# Patient Record
Sex: Male | Born: 1950
Health system: Southern US, Community
[De-identification: ages and names within clinical notes are randomized; demographics above are authoritative.]

## PROBLEM LIST (undated history)

## (undated) DIAGNOSIS — K219 Gastro-esophageal reflux disease without esophagitis: Secondary | ICD-10-CM

## (undated) DIAGNOSIS — F329 Major depressive disorder, single episode, unspecified: Secondary | ICD-10-CM

## (undated) DIAGNOSIS — I219 Acute myocardial infarction, unspecified: Secondary | ICD-10-CM

## (undated) DIAGNOSIS — I739 Peripheral vascular disease, unspecified: Secondary | ICD-10-CM

## (undated) DIAGNOSIS — J449 Chronic obstructive pulmonary disease, unspecified: Secondary | ICD-10-CM

## (undated) DIAGNOSIS — C4491 Basal cell carcinoma of skin, unspecified: Secondary | ICD-10-CM

## (undated) DIAGNOSIS — M1990 Unspecified osteoarthritis, unspecified site: Secondary | ICD-10-CM

## (undated) DIAGNOSIS — F1910 Other psychoactive substance abuse, uncomplicated: Secondary | ICD-10-CM

## (undated) DIAGNOSIS — I2510 Atherosclerotic heart disease of native coronary artery without angina pectoris: Secondary | ICD-10-CM

## (undated) DIAGNOSIS — F419 Anxiety disorder, unspecified: Secondary | ICD-10-CM

## (undated) DIAGNOSIS — E7800 Pure hypercholesterolemia, unspecified: Secondary | ICD-10-CM

## (undated) DIAGNOSIS — I10 Essential (primary) hypertension: Secondary | ICD-10-CM

## (undated) DIAGNOSIS — J189 Pneumonia, unspecified organism: Secondary | ICD-10-CM

## (undated) DIAGNOSIS — F17200 Nicotine dependence, unspecified, uncomplicated: Secondary | ICD-10-CM

## (undated) DIAGNOSIS — I452 Bifascicular block: Secondary | ICD-10-CM

## (undated) DIAGNOSIS — I639 Cerebral infarction, unspecified: Secondary | ICD-10-CM

## (undated) DIAGNOSIS — C801 Malignant (primary) neoplasm, unspecified: Secondary | ICD-10-CM

## (undated) DIAGNOSIS — I779 Disorder of arteries and arterioles, unspecified: Secondary | ICD-10-CM

## (undated) DIAGNOSIS — H269 Unspecified cataract: Secondary | ICD-10-CM

## (undated) DIAGNOSIS — F32A Depression, unspecified: Secondary | ICD-10-CM

## (undated) HISTORY — PX: US EXTREMITY*L*: HXRAD785

## (undated) HISTORY — DX: Disorder of arteries and arterioles, unspecified: I77.9

## (undated) HISTORY — DX: Bifascicular block: I45.2

## (undated) HISTORY — PX: EYE SURGERY: SHX253

## (undated) HISTORY — DX: Depression, unspecified: F32.A

## (undated) HISTORY — DX: Acute myocardial infarction, unspecified: I21.9

## (undated) HISTORY — PX: BACK SURGERY: SHX140

## (undated) HISTORY — DX: Nicotine dependence, unspecified, uncomplicated: F17.200

## (undated) HISTORY — DX: Gastro-esophageal reflux disease without esophagitis: K21.9

## (undated) HISTORY — DX: Major depressive disorder, single episode, unspecified: F32.9

## (undated) HISTORY — DX: Peripheral vascular disease, unspecified: I73.9

## (undated) HISTORY — DX: Unspecified cataract: H26.9

## (undated) HISTORY — DX: Other psychoactive substance abuse, uncomplicated: F19.10

---

## 2002-06-23 HISTORY — PX: FEMORAL ARTERY - FEMORAL ARTERY BYPASS GRAFT: SUR179

## 2002-06-23 HISTORY — PX: CORONARY ANGIOPLASTY: SHX604

## 2002-07-14 ENCOUNTER — Inpatient Hospital Stay (HOSPITAL_COMMUNITY): Admission: AD | Admit: 2002-07-14 | Discharge: 2002-07-16 | Payer: Self-pay | Admitting: Cardiology

## 2002-08-30 ENCOUNTER — Ambulatory Visit (HOSPITAL_COMMUNITY): Admission: RE | Admit: 2002-08-30 | Discharge: 2002-08-30 | Payer: Self-pay | Admitting: Interventional Cardiology

## 2002-08-31 ENCOUNTER — Encounter: Payer: Self-pay | Admitting: Interventional Cardiology

## 2002-08-31 ENCOUNTER — Inpatient Hospital Stay (HOSPITAL_COMMUNITY): Admission: AD | Admit: 2002-08-31 | Discharge: 2002-09-04 | Payer: Self-pay | Admitting: Interventional Cardiology

## 2002-09-01 ENCOUNTER — Encounter (INDEPENDENT_AMBULATORY_CARE_PROVIDER_SITE_OTHER): Payer: Self-pay | Admitting: *Deleted

## 2002-12-08 ENCOUNTER — Emergency Department (HOSPITAL_COMMUNITY): Admission: EM | Admit: 2002-12-08 | Discharge: 2002-12-09 | Payer: Self-pay

## 2002-12-09 ENCOUNTER — Encounter: Payer: Self-pay | Admitting: Cardiology

## 2002-12-09 ENCOUNTER — Inpatient Hospital Stay (HOSPITAL_COMMUNITY): Admission: EM | Admit: 2002-12-09 | Discharge: 2002-12-13 | Payer: Self-pay | Admitting: Psychiatry

## 2002-12-09 ENCOUNTER — Inpatient Hospital Stay (HOSPITAL_COMMUNITY): Admission: AD | Admit: 2002-12-09 | Discharge: 2002-12-09 | Payer: Self-pay | Admitting: Cardiology

## 2002-12-13 ENCOUNTER — Encounter: Payer: Self-pay | Admitting: Psychiatry

## 2003-08-22 ENCOUNTER — Inpatient Hospital Stay (HOSPITAL_COMMUNITY): Admission: EM | Admit: 2003-08-22 | Discharge: 2003-08-25 | Payer: Self-pay | Admitting: Emergency Medicine

## 2004-08-24 ENCOUNTER — Emergency Department (HOSPITAL_COMMUNITY): Admission: EM | Admit: 2004-08-24 | Discharge: 2004-08-24 | Payer: Self-pay | Admitting: Emergency Medicine

## 2004-10-07 ENCOUNTER — Inpatient Hospital Stay (HOSPITAL_COMMUNITY): Admission: EM | Admit: 2004-10-07 | Discharge: 2004-10-12 | Payer: Self-pay | Admitting: Emergency Medicine

## 2004-10-08 ENCOUNTER — Encounter (INDEPENDENT_AMBULATORY_CARE_PROVIDER_SITE_OTHER): Payer: Self-pay | Admitting: Cardiology

## 2008-05-24 ENCOUNTER — Inpatient Hospital Stay (HOSPITAL_COMMUNITY): Admission: EM | Admit: 2008-05-24 | Discharge: 2008-05-27 | Payer: Self-pay | Admitting: Emergency Medicine

## 2008-05-26 ENCOUNTER — Encounter (INDEPENDENT_AMBULATORY_CARE_PROVIDER_SITE_OTHER): Payer: Self-pay | Admitting: Internal Medicine

## 2008-05-30 ENCOUNTER — Ambulatory Visit (HOSPITAL_COMMUNITY): Admission: RE | Admit: 2008-05-30 | Discharge: 2008-05-30 | Payer: Self-pay | Admitting: Cardiovascular Disease

## 2009-07-22 ENCOUNTER — Inpatient Hospital Stay (HOSPITAL_COMMUNITY): Admission: EM | Admit: 2009-07-22 | Discharge: 2009-07-29 | Payer: Self-pay | Admitting: Emergency Medicine

## 2009-07-23 ENCOUNTER — Encounter (INDEPENDENT_AMBULATORY_CARE_PROVIDER_SITE_OTHER): Payer: Self-pay | Admitting: Cardiology

## 2009-07-23 ENCOUNTER — Ambulatory Visit: Payer: Self-pay | Admitting: Cardiothoracic Surgery

## 2009-07-23 ENCOUNTER — Encounter: Payer: Self-pay | Admitting: Cardiothoracic Surgery

## 2009-07-24 HISTORY — PX: CORONARY ARTERY BYPASS GRAFT: SHX141

## 2009-08-15 ENCOUNTER — Ambulatory Visit: Payer: Self-pay | Admitting: Cardiothoracic Surgery

## 2009-08-15 ENCOUNTER — Encounter: Admission: RE | Admit: 2009-08-15 | Discharge: 2009-08-15 | Payer: Self-pay | Admitting: Cardiothoracic Surgery

## 2010-05-28 ENCOUNTER — Ambulatory Visit (HOSPITAL_COMMUNITY)
Admission: RE | Admit: 2010-05-28 | Discharge: 2010-05-29 | Payer: Self-pay | Source: Home / Self Care | Attending: Neurosurgery | Admitting: Neurosurgery

## 2010-07-14 ENCOUNTER — Encounter: Payer: Self-pay | Admitting: Cardiovascular Disease

## 2010-08-22 ENCOUNTER — Emergency Department (HOSPITAL_COMMUNITY): Payer: Self-pay

## 2010-08-22 ENCOUNTER — Observation Stay (HOSPITAL_COMMUNITY)
Admission: EM | Admit: 2010-08-22 | Discharge: 2010-08-25 | DRG: 313 | Disposition: A | Discharge status: Home / Self Care | Payer: Self-pay | Attending: Internal Medicine | Admitting: Internal Medicine

## 2010-08-22 DIAGNOSIS — J4489 Other specified chronic obstructive pulmonary disease: Secondary | ICD-10-CM | POA: Insufficient documentation

## 2010-08-22 DIAGNOSIS — I252 Old myocardial infarction: Secondary | ICD-10-CM | POA: Insufficient documentation

## 2010-08-22 DIAGNOSIS — E119 Type 2 diabetes mellitus without complications: Secondary | ICD-10-CM | POA: Insufficient documentation

## 2010-08-22 DIAGNOSIS — I10 Essential (primary) hypertension: Secondary | ICD-10-CM | POA: Insufficient documentation

## 2010-08-22 DIAGNOSIS — I2510 Atherosclerotic heart disease of native coronary artery without angina pectoris: Secondary | ICD-10-CM | POA: Insufficient documentation

## 2010-08-22 DIAGNOSIS — Z951 Presence of aortocoronary bypass graft: Secondary | ICD-10-CM | POA: Insufficient documentation

## 2010-08-22 DIAGNOSIS — D72829 Elevated white blood cell count, unspecified: Secondary | ICD-10-CM | POA: Insufficient documentation

## 2010-08-22 DIAGNOSIS — E785 Hyperlipidemia, unspecified: Secondary | ICD-10-CM | POA: Insufficient documentation

## 2010-08-22 DIAGNOSIS — R0789 Other chest pain: Principal | ICD-10-CM | POA: Insufficient documentation

## 2010-08-22 DIAGNOSIS — R0602 Shortness of breath: Secondary | ICD-10-CM | POA: Insufficient documentation

## 2010-08-22 DIAGNOSIS — J449 Chronic obstructive pulmonary disease, unspecified: Secondary | ICD-10-CM | POA: Insufficient documentation

## 2010-08-22 DIAGNOSIS — F4001 Agoraphobia with panic disorder: Secondary | ICD-10-CM | POA: Insufficient documentation

## 2010-08-22 LAB — POCT CARDIAC MARKERS
CKMB, poc: 2.3 ng/mL (ref 1.0–8.0)
Troponin i, poc: 0.16 ng/mL — ABNORMAL HIGH (ref 0.00–0.09)

## 2010-08-22 LAB — CK TOTAL AND CKMB (NOT AT ARMC)
CK, MB: 1.2 ng/mL (ref 0.3–4.0)
Relative Index: 1 (ref 0.0–2.5)
Total CK: 115 U/L (ref 7–232)

## 2010-08-22 LAB — DIFFERENTIAL
Basophils Absolute: 0 10*3/uL (ref 0.0–0.1)
Basophils Relative: 0 % (ref 0–1)
Eosinophils Absolute: 0 10*3/uL (ref 0.0–0.7)
Eosinophils Relative: 0 % (ref 0–5)
Lymphocytes Relative: 24 % (ref 12–46)
Lymphs Abs: 3.1 10*3/uL (ref 0.7–4.0)
Monocytes Absolute: 0.8 10*3/uL (ref 0.1–1.0)
Monocytes Relative: 6 % (ref 3–12)
Neutro Abs: 8.9 10*3/uL — ABNORMAL HIGH (ref 1.7–7.7)
Neutrophils Relative %: 69 % (ref 43–77)

## 2010-08-22 LAB — POCT I-STAT, CHEM 8
BUN: 11 mg/dL (ref 6–23)
Calcium, Ion: 1.17 mmol/L (ref 1.12–1.32)
Chloride: 101 meq/L (ref 96–112)
Creatinine, Ser: 1.1 mg/dL (ref 0.4–1.5)
Glucose, Bld: 118 mg/dL — ABNORMAL HIGH (ref 70–99)
HCT: 51 % (ref 39.0–52.0)
Hemoglobin: 17.3 g/dL — ABNORMAL HIGH (ref 13.0–17.0)
Potassium: 4.6 meq/L (ref 3.5–5.1)
Sodium: 134 meq/L — ABNORMAL LOW (ref 135–145)
TCO2: 24 mmol/L (ref 0–100)

## 2010-08-22 LAB — CBC
HCT: 47.4 % (ref 39.0–52.0)
Hemoglobin: 16.5 g/dL (ref 13.0–17.0)
MCH: 32.4 pg (ref 26.0–34.0)
MCHC: 34.8 g/dL (ref 30.0–36.0)
MCV: 93.1 fL (ref 78.0–100.0)
Platelets: 228 K/uL (ref 150–400)
RBC: 5.09 MIL/uL (ref 4.22–5.81)
RDW: 13.3 % (ref 11.5–15.5)
WBC: 12.8 K/uL — ABNORMAL HIGH (ref 4.0–10.5)

## 2010-08-22 LAB — TROPONIN I

## 2010-08-22 LAB — BASIC METABOLIC PANEL WITH GFR
BUN: 9 mg/dL (ref 6–23)
CO2: 25 meq/L (ref 19–32)
Calcium: 9.9 mg/dL (ref 8.4–10.5)
Chloride: 98 meq/L (ref 96–112)
Creatinine, Ser: 1.05 mg/dL (ref 0.4–1.5)
GFR calc non Af Amer: >60 mL/min
Glucose, Bld: 125 mg/dL — ABNORMAL HIGH (ref 70–99)
Potassium: 4.6 meq/L (ref 3.5–5.1)
Sodium: 134 meq/L — ABNORMAL LOW (ref 135–145)

## 2010-08-22 LAB — PROTIME-INR
INR: 0.98 (ref 0.00–1.49)
Prothrombin Time: 13.2 s (ref 11.6–15.2)

## 2010-08-22 LAB — APTT: aPTT: 34 s (ref 24–37)

## 2010-08-23 ENCOUNTER — Inpatient Hospital Stay (HOSPITAL_COMMUNITY): Payer: Self-pay

## 2010-08-23 LAB — BASIC METABOLIC PANEL
BUN: 12 mg/dL (ref 6–23)
Calcium: 9.6 mg/dL (ref 8.4–10.5)
Creatinine, Ser: 1.16 mg/dL (ref 0.4–1.5)
GFR calc non Af Amer: >60 mL/min (ref >60)
Glucose, Bld: 89 mg/dL (ref 70–99)
Potassium: 4.1 mEq/L (ref 3.5–5.1)

## 2010-08-23 LAB — CARDIAC PANEL(CRET KIN+CKTOT+MB+TROPI)
CK, MB: 1.7 ng/mL (ref 0.3–4.0)
Relative Index: 1.3 (ref 0.0–2.5)
Total CK: 127 U/L (ref 7–232)
Troponin I: 0.01 ng/mL (ref 0.00–0.06)
Troponin I: <0.01 ng/mL (ref 0.00–0.06)

## 2010-08-23 LAB — GLUCOSE, CAPILLARY
Glucose-Capillary: 106 mg/dL — ABNORMAL HIGH (ref 70–99)
Glucose-Capillary: 156 mg/dL — ABNORMAL HIGH (ref 70–99)
Glucose-Capillary: 99 mg/dL (ref 70–99)

## 2010-08-23 LAB — CBC
HCT: 44.7 % (ref 39.0–52.0)
Hemoglobin: 15.7 g/dL (ref 13.0–17.0)
MCH: 32.6 pg (ref 26.0–34.0)
MCHC: 35.1 g/dL (ref 30.0–36.0)
MCV: 92.9 fL (ref 78.0–100.0)
RDW: 13.3 % (ref 11.5–15.5)

## 2010-08-23 LAB — BRAIN NATRIURETIC PEPTIDE: Pro B Natriuretic peptide (BNP): <30 pg/mL (ref 0.0–100.0)

## 2010-08-23 LAB — HEPARIN LEVEL (UNFRACTIONATED): Heparin Unfractionated: 0.18 IU/mL — ABNORMAL LOW (ref 0.30–0.70)

## 2010-08-23 MED ORDER — TECHNETIUM TC 99M TETROFOSMIN IV KIT
10.0000 | PACK | Freq: Once | INTRAVENOUS | Status: AC | PRN
  Administered 2010-08-23: 10 via INTRAVENOUS

## 2010-08-23 MED ORDER — TECHNETIUM TC 99M TETROFOSMIN IV KIT
30.0000 | PACK | Freq: Once | INTRAVENOUS | Status: AC | PRN
  Administered 2010-08-23: 30 via INTRAVENOUS

## 2010-08-24 ENCOUNTER — Inpatient Hospital Stay (HOSPITAL_COMMUNITY): Payer: Self-pay

## 2010-08-24 DIAGNOSIS — F064 Anxiety disorder due to known physiological condition: Secondary | ICD-10-CM

## 2010-08-24 LAB — GLUCOSE, CAPILLARY
Glucose-Capillary: 157 mg/dL — ABNORMAL HIGH (ref 70–99)
Glucose-Capillary: 113 mg/dL — ABNORMAL HIGH (ref 70–99)
Glucose-Capillary: 140 mg/dL — ABNORMAL HIGH (ref 70–99)

## 2010-08-24 LAB — CBC
Hemoglobin: 16.3 g/dL (ref 13.0–17.0)
RBC: 5.04 MIL/uL (ref 4.22–5.81)
WBC: 13.3 10*3/uL — ABNORMAL HIGH (ref 4.0–10.5)

## 2010-08-25 LAB — CBC
HCT: 45.3 % (ref 39.0–52.0)
RDW: 13 % (ref 11.5–15.5)
WBC: 13.6 10*3/uL — ABNORMAL HIGH (ref 4.0–10.5)

## 2010-08-25 LAB — GLUCOSE, CAPILLARY: Glucose-Capillary: 113 mg/dL — ABNORMAL HIGH (ref 70–99)

## 2010-08-26 NOTE — Consult Note (Signed)
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  Psychiatric consultation is requested to assess mental status and make recommendations in relation to his anxiety symptoms.  HISTORY OF PRESENT ILLNESS:  The patient is an informant for the following information and he appears to be a good historian.  He indicates that he is currently a patient of Dr. Andee Poles, at the [facility]  He has a history of myocardial infarction, hypertension, hyperlipidemia, diabetes mellitus, and COPD.  He also indicates that since approximately 1986, he has had continuous problems with anxiety and panic episodes.  He states that they are worsening over time.  Recently, he has four to five episodes of panic per week characterized by extreme, overwhelming anxiety, shortness of breath, tunnel vision, and a sense of completely "locking up."  This has become increasingly disabling, and because of the possibility that it can happen in public, he has become increasingly isolative over the years.  Current psychotropic medications include Paxil 20 mg daily, just initiated here in the past day, but prior to this, he was taking Celexa for some time.  His impression is that Celexa has never been beneficial. He had also been taking Xanax, prescribed by Dr. Andee Poles.  He was apparently taking 0.25 mg once or twice a day, only on an as-needed basis.  That is, he was waiting until he got good and anxious, or could not sleep, to take some Xanax.  He  states that he has sought treatment for his anxiety symptoms in many different places and from various providers over the years.  Long time ago, he states he spent a lot of money, to no avail, on expenses psychotherapy sessions.  He cannot recall any medication that has been of any particular benefit over time.  PAST MEDICAL HISTORY:  As above.  MEDICATIONS:  Current medications include, 1. Insulin. 2. Advair. 3. Xopenex. 4. Metformin. 5. Lopressor. 6. Crestor. 7. Aspirin. 8. Paxil 20 mg daily. 9. Xanax 0.25 mg q.8 h p.r.n.  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 He has a grown daughter.  He does have friends, but has been increasingly isolative and withdrawn.  He indicates that he does not use alcohol or drugs.  FAMILY HISTORY:  He states that his mother was "anxious a lot," and his father had nervous tendencies as well.  MENTAL STATUS AND OBSERVATIONS:  The patient is a well-nourished, normally-developed, adult male, who was pleasant and cooperative.  He is anxious with depressed mood.  He appears to be a good historian. Thoughts and speech are normally organized and there is nothing to suggest any thought  disorder or cognitive impairment.  Insight and judgment are good.  IMPRESSION:  Axis I:  Panic disorder with agoraphobia, severe, chronic. Axis II:  Deferred. Axis III:  See medical notes. Axis IV:  Stressors severe. Axis V:  GAF of 50.  RECOMMENDATIONS:  I had a long discussion with Jason [name] today regarding the nature and phenomenology of panic disorder, and its treatment.  I let him know that there is good hope that his symptoms can be gotten under much better control.  Although it is reasonable for him to be on a trial of SSRI antidepressant, there are a fair number of individuals with anxiety and panic, who did not respond to them.  These individuals may need to be on a benzodiazepine on a lifelong basis, and as long as they  have no history of substance abuse, which the case here, there is no problem with them taking routine doses of medication such as Klonopin, Xanax, or other benzodiazepines on a monitored and scheduled basis.  It is my impression that Jason [name]'s anxiety disorder over time has been significantly under treated.  It also appears that no physical or medical basis for his current physical symptoms is being found in course of this hospitalization.  Jason [name] agreed with me that if it turns out that all of his symptoms have been due to his anxiety, that is a preferable finding, as opposed to one it indicates that he is having an advancement or worsening of his cardiac disease.  Many individuals with histories of cardiac disease and myocardial infarction are prone to anxiety and depression, and in some cases, the development of a "cardiac neurosis," which may be the case here as well.  My recommendation for now would be to discontinue Xanax.  Because of his longer half-life, Klonopin is a preferable medication.  I would begin with 0.5 mg t.i.d., which I have ordered.  If he becomes oversedated, which is unlikely, dose should be held, but otherwise, from 0.5 mg t.i.d., there can be an upward titration until he is feeling comfortable with respect to anxiety.  This will need to be monitored by a physician following his discharge from [facility].  I gave him the name of two psychiatrists in the area, Dr. Nolen Mu and Dr. Jennelle Human.  The patient commented that he does not have any insurance and it would be hard for him to pay out of pocket.  I told him if he is not able to afford to see a psychiatrist, that he should have his primary care physician, Dr. Andee Poles, telephone me, so I can advise him on how best to treat and adjust the Klonopin over time. I gave the patient my cell phone number, so he gave this to Dr. Andee Poles.  Thank you for involving Psychiatry in this most interesting  patient's care.     Anselm Jungling, MD     SPB/MEDQ  D:  08/24/2010  T:  08/24/2010  Job:  [id number]  Electronically Signed by Geralyn Flash MD on 08/26/2010 11:05:08 AM

## 2010-08-30 NOTE — Discharge Summary (Signed)
Jason [name], Jason [name]             ACCOUNT NO.:  [id number]  MEDICAL RECORD NO.:  [id number]           PATIENT TYPE:  I  LOCATION:  2035                         FACILITY:  [facility]  PHYSICIAN:  [place] Keyandre Pileggi, MD         DATE OF BIRTH:  1950/09/14  DATE OF ADMISSION:  08/22/2010 DATE OF DISCHARGE:  08/25/2010                              DISCHARGE SUMMARY   DISCHARGE DIAGNOSES: 1. Chest pain, noncardiac, with negative myocardial infarction,     negative cardiac enzymes, and negative ischemia on Lexiscan     Myoview.  Chest pain secondary to panic attacks. 2. Anxiety/panic attacks. 3. Coronary artery disease with bypass grafting x4. 4. Diabetes mellitus type 2. 5. Hypertension. 6. Leukocytosis, still slightly elevated at discharge but no obvious     infection.  We will follow up as an outpatient.  DISCHARGE CONDITION:  Improved.  PROCEDURES:  None.  CONSULTS:  Dr. Geralyn Flash, psychiatrist, for anxiety and panic attacks.  DISCHARGE CONDITION:  Improved.  DISCHARGE MEDICATIONS:  See medication reconciliation from [facility], though we changed the patient from Xanax to Klonopin, changed from Celexa to Paxil.  Dr. Royann Shivers told him he could take an occasional Xanax if he needed it for breakthrough stressors that were not resolved with the Klonopin.  We also increased his beta-blocker and increase his metformin.  DISCHARGE INSTRUCTIONS: 1. May return to work on August 26, 2010.  Activity as tolerated.  Low     sodium, heart-healthy diabetic diet. 2. Follow up with Dr. Cleta Alberts this week. 3. Return to Dr. Alanda Amass in 1-2 weeks.  The office will call with     date and time.  HISTORY OF PRESENT ILLNESS:  A 60 year old white male with a history of coronary disease with bypass grafting in February 2011 x3 vessels with vein graft to the RI and OM, vein graft to the PDA and LIMA to the LAD. Previously had had stents to the RCA x2 in 2004.  Additionally has diabetes mellitus, continues to  smoke, and noncompliant with office followup at times.  His last cath was prior to his bypass grafting.  He was seen by Dr. Rennis Golden in the ER at [facility] on August 22, 2010 after being sent by Dr. Cleta Alberts, his primary, for increased dyspnea on exertion, chest tightness, and pressure.  Chest x-ray was negative.  EKG showed right bundle-branch block but he was also having extreme anxiety issues. He did complain of what felt like a brick sitting on his chest.  He was admitted, placed on IV heparin, IV nitroglycerin, and scheduled for a stress test.  His initial cardiac markers were troponin that was elevated at 0.14, but his cardiac enzymes were totally negative.  His nuclear study was negative for ischemia with EF of 60%.  He was going to be discharged, but then he stated his anxiety was so severe, he was concerned that he may harm himself.  He had run out of the Xanax as well.  So, we asked Psychiatry to see him to assist with his extreme anxiety and depression.  He was seen on August 24, 2010 and his Xanax  was changed to Klonopin and he was diagnosed with severe panic disorder with agoraphobia and cardiac neurosis.  The patient was given referrals for a psychiatrist.  The patient does not have any insurance, but the psychiatrist actually gave the patient his cell number and asked the patient to give it to Dr. Cleta Alberts so that if he could not get an appointment with one of the psychiatrist refer he referred him to, that Dr. Cleta Alberts could call the psychiatrist and they could adjust his Klonopin together.  By August 25, 2010 the patient was stable.  He did not think the Klonopin was helping very well and he had trouble going to bed and sleeping.  He received his Ambien and that was why we said it would be okay to take Xanax as a standby.  The patient was discharged on August 25, 2010 after Dr. Royann Shivers saw him and discussed the issues with him.  LABORATORY VALUES:  Hemoglobin 15.9; hematocrit 45.3; WBC was  13.6, it was slightly elevated throughout hospitalization; platelets 210.  His Accu-Cheks here were well controlled.  Dr. Rennis Golden was putting him on Lantus insulin, but unfortunately the patient states he cannot afford it at this time so at discharge we increased his metformin to 1000 mg twice a day and we will have Dr. Cleta Alberts follow his diabetes.  Chemistry; 137, potassium 4.1, chloride 102, CO2 of 25, glucose 89, BUN 12, creatinine 1.16, calcium 9.6.  CKs range 115, 142, 127 and MBs 1.2, 1.8, and 1.7. Troponin-Is were all negative at less than 0.01 and as stated one troponin marker was slightly elevated at 0.16.  BNP was less than 30.  RADIOLOGY:  Chest x-ray; no active cardiopulmonary disease.  Nuclear study; no evidence of ischemia or infarction, EF 60%.  EKG; sinus rhythm with right bundle-branch block and no acute changes otherwise.  The patient will follow up with Dr. Alanda Amass and Dr. Cleta Alberts and hopefully, he will be able to get in with the psychiatrist.  Please note the psychiatrist the patient was referred to, which is listed in Dr. Barrett Shell note, Dr. Nolen Mu or Dr. Jennelle Human.     Darcella Gasman. Annie Paras, N.P.   ______________________________ [place] Sharlisa Hollifield, MD    LRI/MEDQ  D:  08/25/2010  T:  08/26/2010  Job:  [id number]  cc:   Gerlene Burdock A. Alanda Amass, M.D. [place] Cleta Alberts, M.D.  Electronically Signed by Nada Boozer N.P. on 08/27/2010 05:49:33 PM Electronically Signed by Kirtland Bouchard. Zed Wanninger M.D. on 08/29/2010 08:02:18 AM

## 2010-09-02 LAB — GLUCOSE, CAPILLARY
Glucose-Capillary: 114 mg/dL — ABNORMAL HIGH (ref 70–99)
Glucose-Capillary: 115 mg/dL — ABNORMAL HIGH (ref 70–99)
Glucose-Capillary: 135 mg/dL — ABNORMAL HIGH (ref 70–99)
Glucose-Capillary: 183 mg/dL — ABNORMAL HIGH (ref 70–99)

## 2010-09-02 LAB — BASIC METABOLIC PANEL
BUN: 8 mg/dL (ref 6–23)
CO2: 27 mEq/L (ref 19–32)
Calcium: 10.2 mg/dL (ref 8.4–10.5)
Chloride: 102 mEq/L (ref 96–112)
Creatinine, Ser: 1.11 mg/dL (ref 0.4–1.5)
GFR calc Af Amer: >60 mL/min (ref >60)
GFR calc non Af Amer: >60 mL/min (ref >60)
Glucose, Bld: 87 mg/dL (ref 70–99)
Potassium: 4.7 mEq/L (ref 3.5–5.1)
Sodium: 138 mEq/L (ref 135–145)

## 2010-09-02 LAB — CBC
HCT: 48.7 % (ref 39.0–52.0)
Hemoglobin: 17.1 g/dL — ABNORMAL HIGH (ref 13.0–17.0)
MCH: 32.9 pg (ref 26.0–34.0)
MCHC: 35.1 g/dL (ref 30.0–36.0)
MCV: 93.8 fL (ref 78.0–100.0)
Platelets: 236 10*3/uL (ref 150–400)
RBC: 5.19 MIL/uL (ref 4.22–5.81)
RDW: 13 % (ref 11.5–15.5)
WBC: 10.9 10*3/uL — ABNORMAL HIGH (ref 4.0–10.5)

## 2010-09-02 LAB — SURGICAL PCR SCREEN
MRSA, PCR: NEGATIVE
Staphylococcus aureus: NEGATIVE

## 2010-09-08 LAB — CARDIAC PANEL(CRET KIN+CKTOT+MB+TROPI)
CK, MB: 2.9 ng/mL (ref 0.3–4.0)
CK, MB: 3.7 ng/mL (ref 0.3–4.0)
CK, MB: 4.4 ng/mL — ABNORMAL HIGH (ref 0.3–4.0)
CK, MB: 7.3 ng/mL (ref 0.3–4.0)
CK, MB: 8.9 ng/mL (ref 0.3–4.0)
Relative Index: 8.9 — ABNORMAL HIGH (ref 0.0–2.5)
Relative Index: INVALID (ref 0.0–2.5)
Relative Index: INVALID (ref 0.0–2.5)
Relative Index: INVALID (ref 0.0–2.5)
Relative Index: INVALID (ref 0.0–2.5)
Total CK: 100 U/L (ref 7–232)
Total CK: 50 U/L (ref 7–232)
Total CK: 57 U/L (ref 7–232)
Total CK: 58 U/L (ref 7–232)
Total CK: 86 U/L (ref 7–232)
Troponin I: 0.45 ng/mL — ABNORMAL HIGH (ref 0.00–0.06)
Troponin I: 0.45 ng/mL — ABNORMAL HIGH (ref 0.00–0.06)
Troponin I: 0.6 ng/mL (ref 0.00–0.06)
Troponin I: 0.62 ng/mL (ref 0.00–0.06)
Troponin I: 0.64 ng/mL (ref 0.00–0.06)

## 2010-09-08 LAB — CK TOTAL AND CKMB (NOT AT ARMC)
CK, MB: 7.8 ng/mL (ref 0.3–4.0)
Relative Index: INVALID (ref 0.0–2.5)
Total CK: 96 U/L (ref 7–232)

## 2010-09-08 LAB — HEMOGLOBIN A1C
Hgb A1c MFr Bld: 6.8 % — ABNORMAL HIGH (ref 4.6–6.1)
Mean Plasma Glucose: 148 mg/dL

## 2010-09-08 LAB — BASIC METABOLIC PANEL
BUN: 15 mg/dL (ref 6–23)
CO2: 24 mEq/L (ref 19–32)
Calcium: 8.3 mg/dL — ABNORMAL LOW (ref 8.4–10.5)
Chloride: 110 mEq/L (ref 96–112)
Creatinine, Ser: 1.3 mg/dL (ref 0.4–1.5)
GFR calc Af Amer: >60 mL/min (ref >60)
GFR calc non Af Amer: 57 mL/min — ABNORMAL LOW (ref >60)
Glucose, Bld: 141 mg/dL — ABNORMAL HIGH (ref 70–99)
Potassium: 3.7 mEq/L (ref 3.5–5.1)
Sodium: 140 mEq/L (ref 135–145)

## 2010-09-08 LAB — PROTIME-INR
INR: 0.89 (ref 0.00–1.49)
Prothrombin Time: 12 seconds (ref 11.6–15.2)

## 2010-09-08 LAB — HEPARIN LEVEL (UNFRACTIONATED)
Heparin Unfractionated: 0.23 IU/mL — ABNORMAL LOW (ref 0.30–0.70)
Heparin Unfractionated: 0.25 IU/mL — ABNORMAL LOW (ref 0.30–0.70)

## 2010-09-08 LAB — TROPONIN I: Troponin I: 0.43 ng/mL — ABNORMAL HIGH (ref 0.00–0.06)

## 2010-09-08 LAB — COMPREHENSIVE METABOLIC PANEL
ALT: 26 U/L (ref 0–53)
AST: 23 U/L (ref 0–37)
Albumin: 3.7 g/dL (ref 3.5–5.2)
Alkaline Phosphatase: 85 U/L (ref 39–117)
BUN: 10 mg/dL (ref 6–23)
CO2: 22 mEq/L (ref 19–32)
Calcium: 9.1 mg/dL (ref 8.4–10.5)
Chloride: 106 mEq/L (ref 96–112)
Creatinine, Ser: 0.99 mg/dL (ref 0.4–1.5)
GFR calc Af Amer: >60 mL/min (ref >60)
GFR calc non Af Amer: >60 mL/min (ref >60)
Glucose, Bld: 162 mg/dL — ABNORMAL HIGH (ref 70–99)
Potassium: 4.2 mEq/L (ref 3.5–5.1)
Sodium: 136 mEq/L (ref 135–145)
Total Bilirubin: 0.5 mg/dL (ref 0.3–1.2)
Total Protein: 6.5 g/dL (ref 6.0–8.3)

## 2010-09-08 LAB — DIFFERENTIAL
Basophils Absolute: 0.1 10*3/uL (ref 0.0–0.1)
Basophils Relative: 1 % (ref 0–1)
Eosinophils Absolute: 0.4 10*3/uL (ref 0.0–0.7)
Eosinophils Relative: 3 % (ref 0–5)
Lymphocytes Relative: 31 % (ref 12–46)
Lymphs Abs: 3.4 10*3/uL (ref 0.7–4.0)
Monocytes Absolute: 0.7 10*3/uL (ref 0.1–1.0)
Monocytes Relative: 6 % (ref 3–12)
Neutro Abs: 6.7 10*3/uL (ref 1.7–7.7)
Neutrophils Relative %: 59 % (ref 43–77)

## 2010-09-08 LAB — CROSSMATCH
ABO/RH(D): O POS
Antibody Screen: NEGATIVE

## 2010-09-08 LAB — CBC
HCT: 34.8 % — ABNORMAL LOW (ref 39.0–52.0)
HCT: 42.4 % (ref 39.0–52.0)
Hemoglobin: 12.2 g/dL — ABNORMAL LOW (ref 13.0–17.0)
Hemoglobin: 14.7 g/dL (ref 13.0–17.0)
MCHC: 34.7 g/dL (ref 30.0–36.0)
MCHC: 35 g/dL (ref 30.0–36.0)
MCV: 95.5 fL (ref 78.0–100.0)
MCV: 96.2 fL (ref 78.0–100.0)
Platelets: 174 10*3/uL (ref 150–400)
Platelets: 211 10*3/uL (ref 150–400)
RBC: 3.62 MIL/uL — ABNORMAL LOW (ref 4.22–5.81)
RBC: 4.44 MIL/uL (ref 4.22–5.81)
RDW: 13 % (ref 11.5–15.5)
RDW: 13.1 % (ref 11.5–15.5)
WBC: 11.3 10*3/uL — ABNORMAL HIGH (ref 4.0–10.5)
WBC: 13.8 10*3/uL — ABNORMAL HIGH (ref 4.0–10.5)

## 2010-09-08 LAB — GLUCOSE, CAPILLARY
Glucose-Capillary: 115 mg/dL — ABNORMAL HIGH (ref 70–99)
Glucose-Capillary: 123 mg/dL — ABNORMAL HIGH (ref 70–99)
Glucose-Capillary: 127 mg/dL — ABNORMAL HIGH (ref 70–99)
Glucose-Capillary: 84 mg/dL (ref 70–99)

## 2010-09-08 LAB — LIPID PANEL
Cholesterol: 171 mg/dL (ref 0–200)
HDL: 29 mg/dL — ABNORMAL LOW (ref >39)
LDL Cholesterol: 85 mg/dL (ref 0–99)
Total CHOL/HDL Ratio: 5.9 RATIO
Triglycerides: 284 mg/dL — ABNORMAL HIGH (ref <150)
VLDL: 57 mg/dL — ABNORMAL HIGH (ref 0–40)

## 2010-09-08 LAB — POCT I-STAT, CHEM 8
BUN: 12 mg/dL (ref 6–23)
Calcium, Ion: 1.18 mmol/L (ref 1.12–1.32)
Chloride: 109 mEq/L (ref 96–112)
Creatinine, Ser: 0.7 mg/dL (ref 0.4–1.5)
Glucose, Bld: 179 mg/dL — ABNORMAL HIGH (ref 70–99)
HCT: 43 % (ref 39.0–52.0)
Hemoglobin: 14.6 g/dL (ref 13.0–17.0)
Potassium: 4.2 mEq/L (ref 3.5–5.1)
Sodium: 136 mEq/L (ref 135–145)
TCO2: 22 mmol/L (ref 0–100)

## 2010-09-08 LAB — MRSA PCR SCREENING: MRSA by PCR: NEGATIVE

## 2010-09-08 LAB — TSH: TSH: 3.323 u[IU]/mL (ref 0.350–4.500)

## 2010-09-08 LAB — ABO/RH: ABO/RH(D): O POS

## 2010-09-08 LAB — BRAIN NATRIURETIC PEPTIDE: Pro B Natriuretic peptide (BNP): 54 pg/mL (ref 0.0–100.0)

## 2010-09-08 LAB — MAGNESIUM: Magnesium: 2.1 mg/dL (ref 1.5–2.5)

## 2010-09-11 LAB — POCT I-STAT 4, (NA,K, GLUC, HGB,HCT)
Glucose, Bld: 120 mg/dL — ABNORMAL HIGH (ref 70–99)
Glucose, Bld: 126 mg/dL — ABNORMAL HIGH (ref 70–99)
Glucose, Bld: 140 mg/dL — ABNORMAL HIGH (ref 70–99)
Glucose, Bld: 142 mg/dL — ABNORMAL HIGH (ref 70–99)
Glucose, Bld: 142 mg/dL — ABNORMAL HIGH (ref 70–99)
Glucose, Bld: 193 mg/dL — ABNORMAL HIGH (ref 70–99)
HCT: 24 % — ABNORMAL LOW (ref 39.0–52.0)
HCT: 28 % — ABNORMAL LOW (ref 39.0–52.0)
HCT: 30 % — ABNORMAL LOW (ref 39.0–52.0)
HCT: 34 % — ABNORMAL LOW (ref 39.0–52.0)
HCT: 35 % — ABNORMAL LOW (ref 39.0–52.0)
HCT: 36 % — ABNORMAL LOW (ref 39.0–52.0)
Hemoglobin: 10.2 g/dL — ABNORMAL LOW (ref 13.0–17.0)
Hemoglobin: 11.6 g/dL — ABNORMAL LOW (ref 13.0–17.0)
Hemoglobin: 11.9 g/dL — ABNORMAL LOW (ref 13.0–17.0)
Hemoglobin: 12.2 g/dL — ABNORMAL LOW (ref 13.0–17.0)
Hemoglobin: 8.2 g/dL — ABNORMAL LOW (ref 13.0–17.0)
Hemoglobin: 9.5 g/dL — ABNORMAL LOW (ref 13.0–17.0)
Potassium: 3.7 mEq/L (ref 3.5–5.1)
Potassium: 4 mEq/L (ref 3.5–5.1)
Potassium: 4 mEq/L (ref 3.5–5.1)
Potassium: 4.1 mEq/L (ref 3.5–5.1)
Potassium: 4.3 mEq/L (ref 3.5–5.1)
Potassium: 5.1 mEq/L (ref 3.5–5.1)
Sodium: 133 mEq/L — ABNORMAL LOW (ref 135–145)
Sodium: 136 mEq/L (ref 135–145)
Sodium: 136 mEq/L (ref 135–145)
Sodium: 137 mEq/L (ref 135–145)
Sodium: 137 mEq/L (ref 135–145)
Sodium: 138 mEq/L (ref 135–145)

## 2010-09-11 LAB — BLOOD GAS, ARTERIAL
Acid-base deficit: 2.5 mmol/L — ABNORMAL HIGH (ref 0.0–2.0)
Bicarbonate: 21.2 mEq/L (ref 20.0–24.0)
O2 Saturation: 91.6 %
Patient temperature: 98.6
TCO2: 22.2 mmol/L (ref 0–100)
pCO2 arterial: 33 mmHg — ABNORMAL LOW (ref 35.0–45.0)
pH, Arterial: 7.423 (ref 7.350–7.450)
pO2, Arterial: 59.8 mmHg — ABNORMAL LOW (ref 80.0–100.0)

## 2010-09-11 LAB — GLUCOSE, CAPILLARY
Glucose-Capillary: 101 mg/dL — ABNORMAL HIGH (ref 70–99)
Glucose-Capillary: 102 mg/dL — ABNORMAL HIGH (ref 70–99)
Glucose-Capillary: 110 mg/dL — ABNORMAL HIGH (ref 70–99)
Glucose-Capillary: 113 mg/dL — ABNORMAL HIGH (ref 70–99)
Glucose-Capillary: 116 mg/dL — ABNORMAL HIGH (ref 70–99)
Glucose-Capillary: 116 mg/dL — ABNORMAL HIGH (ref 70–99)
Glucose-Capillary: 117 mg/dL — ABNORMAL HIGH (ref 70–99)
Glucose-Capillary: 117 mg/dL — ABNORMAL HIGH (ref 70–99)
Glucose-Capillary: 118 mg/dL — ABNORMAL HIGH (ref 70–99)
Glucose-Capillary: 119 mg/dL — ABNORMAL HIGH (ref 70–99)
Glucose-Capillary: 120 mg/dL — ABNORMAL HIGH (ref 70–99)
Glucose-Capillary: 127 mg/dL — ABNORMAL HIGH (ref 70–99)
Glucose-Capillary: 135 mg/dL — ABNORMAL HIGH (ref 70–99)
Glucose-Capillary: 137 mg/dL — ABNORMAL HIGH (ref 70–99)
Glucose-Capillary: 137 mg/dL — ABNORMAL HIGH (ref 70–99)
Glucose-Capillary: 140 mg/dL — ABNORMAL HIGH (ref 70–99)
Glucose-Capillary: 144 mg/dL — ABNORMAL HIGH (ref 70–99)
Glucose-Capillary: 147 mg/dL — ABNORMAL HIGH (ref 70–99)
Glucose-Capillary: 167 mg/dL — ABNORMAL HIGH (ref 70–99)
Glucose-Capillary: 173 mg/dL — ABNORMAL HIGH (ref 70–99)
Glucose-Capillary: 179 mg/dL — ABNORMAL HIGH (ref 70–99)
Glucose-Capillary: 204 mg/dL — ABNORMAL HIGH (ref 70–99)
Glucose-Capillary: 79 mg/dL (ref 70–99)
Glucose-Capillary: 89 mg/dL (ref 70–99)
Glucose-Capillary: 98 mg/dL (ref 70–99)
Glucose-Capillary: 99 mg/dL (ref 70–99)

## 2010-09-11 LAB — CBC
HCT: 29 % — ABNORMAL LOW (ref 39.0–52.0)
HCT: 29.6 % — ABNORMAL LOW (ref 39.0–52.0)
HCT: 30.7 % — ABNORMAL LOW (ref 39.0–52.0)
HCT: 32.8 % — ABNORMAL LOW (ref 39.0–52.0)
HCT: 36.2 % — ABNORMAL LOW (ref 39.0–52.0)
HCT: 36.6 % — ABNORMAL LOW (ref 39.0–52.0)
HCT: 37.6 % — ABNORMAL LOW (ref 39.0–52.0)
Hemoglobin: 10.1 g/dL — ABNORMAL LOW (ref 13.0–17.0)
Hemoglobin: 10.4 g/dL — ABNORMAL LOW (ref 13.0–17.0)
Hemoglobin: 10.7 g/dL — ABNORMAL LOW (ref 13.0–17.0)
Hemoglobin: 11.3 g/dL — ABNORMAL LOW (ref 13.0–17.0)
Hemoglobin: 12.4 g/dL — ABNORMAL LOW (ref 13.0–17.0)
Hemoglobin: 12.5 g/dL — ABNORMAL LOW (ref 13.0–17.0)
Hemoglobin: 13 g/dL (ref 13.0–17.0)
MCHC: 34.1 g/dL (ref 30.0–36.0)
MCHC: 34.3 g/dL (ref 30.0–36.0)
MCHC: 34.3 g/dL (ref 30.0–36.0)
MCHC: 34.5 g/dL (ref 30.0–36.0)
MCHC: 34.8 g/dL (ref 30.0–36.0)
MCHC: 34.9 g/dL (ref 30.0–36.0)
MCHC: 35.2 g/dL (ref 30.0–36.0)
MCV: 94.3 fL (ref 78.0–100.0)
MCV: 95.9 fL (ref 78.0–100.0)
MCV: 96 fL (ref 78.0–100.0)
MCV: 96 fL (ref 78.0–100.0)
MCV: 96.2 fL (ref 78.0–100.0)
MCV: 96.3 fL (ref 78.0–100.0)
MCV: 96.5 fL (ref 78.0–100.0)
Platelets: 118 10*3/uL — ABNORMAL LOW (ref 150–400)
Platelets: 122 10*3/uL — ABNORMAL LOW (ref 150–400)
Platelets: 131 10*3/uL — ABNORMAL LOW (ref 150–400)
Platelets: 140 10*3/uL — ABNORMAL LOW (ref 150–400)
Platelets: 142 10*3/uL — ABNORMAL LOW (ref 150–400)
Platelets: 142 10*3/uL — ABNORMAL LOW (ref 150–400)
Platelets: 182 10*3/uL (ref 150–400)
RBC: 3.02 MIL/uL — ABNORMAL LOW (ref 4.22–5.81)
RBC: 3.08 MIL/uL — ABNORMAL LOW (ref 4.22–5.81)
RBC: 3.2 MIL/uL — ABNORMAL LOW (ref 4.22–5.81)
RBC: 3.4 MIL/uL — ABNORMAL LOW (ref 4.22–5.81)
RBC: 3.82 MIL/uL — ABNORMAL LOW (ref 4.22–5.81)
RBC: 3.83 MIL/uL — ABNORMAL LOW (ref 4.22–5.81)
RBC: 3.9 MIL/uL — ABNORMAL LOW (ref 4.22–5.81)
RDW: 12.7 % (ref 11.5–15.5)
RDW: 12.9 % (ref 11.5–15.5)
RDW: 13 % (ref 11.5–15.5)
RDW: 13 % (ref 11.5–15.5)
RDW: 13.1 % (ref 11.5–15.5)
RDW: 13.2 % (ref 11.5–15.5)
RDW: 13.3 % (ref 11.5–15.5)
WBC: 10.4 10*3/uL (ref 4.0–10.5)
WBC: 10.6 10*3/uL — ABNORMAL HIGH (ref 4.0–10.5)
WBC: 12.6 10*3/uL — ABNORMAL HIGH (ref 4.0–10.5)
WBC: 13.2 10*3/uL — ABNORMAL HIGH (ref 4.0–10.5)
WBC: 13.3 10*3/uL — ABNORMAL HIGH (ref 4.0–10.5)
WBC: 14.5 10*3/uL — ABNORMAL HIGH (ref 4.0–10.5)
WBC: 19.3 10*3/uL — ABNORMAL HIGH (ref 4.0–10.5)

## 2010-09-11 LAB — HEMOGLOBIN AND HEMATOCRIT, BLOOD
HCT: 28.5 % — ABNORMAL LOW (ref 39.0–52.0)
Hemoglobin: 9.8 g/dL — ABNORMAL LOW (ref 13.0–17.0)

## 2010-09-11 LAB — POCT I-STAT, CHEM 8
BUN: 7 mg/dL (ref 6–23)
BUN: 7 mg/dL (ref 6–23)
Calcium, Ion: 1.15 mmol/L (ref 1.12–1.32)
Calcium, Ion: 1.15 mmol/L (ref 1.12–1.32)
Chloride: 101 mEq/L (ref 96–112)
Chloride: 109 mEq/L (ref 96–112)
Creatinine, Ser: 0.8 mg/dL (ref 0.4–1.5)
Creatinine, Ser: 1 mg/dL (ref 0.4–1.5)
Glucose, Bld: 128 mg/dL — ABNORMAL HIGH (ref 70–99)
Glucose, Bld: 138 mg/dL — ABNORMAL HIGH (ref 70–99)
HCT: 31 % — ABNORMAL LOW (ref 39.0–52.0)
HCT: 35 % — ABNORMAL LOW (ref 39.0–52.0)
Hemoglobin: 10.5 g/dL — ABNORMAL LOW (ref 13.0–17.0)
Hemoglobin: 11.9 g/dL — ABNORMAL LOW (ref 13.0–17.0)
Potassium: 3.8 mEq/L (ref 3.5–5.1)
Potassium: 4.9 mEq/L (ref 3.5–5.1)
Sodium: 133 mEq/L — ABNORMAL LOW (ref 135–145)
Sodium: 137 mEq/L (ref 135–145)
TCO2: 20 mmol/L (ref 0–100)
TCO2: 27 mmol/L (ref 0–100)

## 2010-09-11 LAB — URINALYSIS, ROUTINE W REFLEX MICROSCOPIC
Bilirubin Urine: NEGATIVE
Glucose, UA: NEGATIVE mg/dL
Hgb urine dipstick: NEGATIVE
Ketones, ur: NEGATIVE mg/dL
Nitrite: NEGATIVE
Protein, ur: NEGATIVE mg/dL
Specific Gravity, Urine: 1.013 (ref 1.005–1.030)
Urobilinogen, UA: 0.2 mg/dL (ref 0.0–1.0)
pH: 6.5 (ref 5.0–8.0)

## 2010-09-11 LAB — POCT I-STAT 3, ART BLOOD GAS (G3+)
Acid-base deficit: 2 mmol/L (ref 0.0–2.0)
Acid-base deficit: 4 mmol/L — ABNORMAL HIGH (ref 0.0–2.0)
Acid-base deficit: 4 mmol/L — ABNORMAL HIGH (ref 0.0–2.0)
Acid-base deficit: 4 mmol/L — ABNORMAL HIGH (ref 0.0–2.0)
Acid-base deficit: 5 mmol/L — ABNORMAL HIGH (ref 0.0–2.0)
Bicarbonate: 20.9 mEq/L (ref 20.0–24.0)
Bicarbonate: 21 mEq/L (ref 20.0–24.0)
Bicarbonate: 21 mEq/L (ref 20.0–24.0)
Bicarbonate: 22.1 mEq/L (ref 20.0–24.0)
Bicarbonate: 23.8 mEq/L (ref 20.0–24.0)
O2 Saturation: 100 %
O2 Saturation: 94 %
O2 Saturation: 95 %
O2 Saturation: 96 %
O2 Saturation: 99 %
Patient temperature: 95
Patient temperature: 98.3
Patient temperature: 99
TCO2: 22 mmol/L (ref 0–100)
TCO2: 22 mmol/L (ref 0–100)
TCO2: 22 mmol/L (ref 0–100)
TCO2: 24 mmol/L (ref 0–100)
TCO2: 25 mmol/L (ref 0–100)
pCO2 arterial: 36 mmHg (ref 35.0–45.0)
pCO2 arterial: 36 mmHg (ref 35.0–45.0)
pCO2 arterial: 38.8 mmHg (ref 35.0–45.0)
pCO2 arterial: 41.7 mmHg (ref 35.0–45.0)
pCO2 arterial: 45.7 mmHg — ABNORMAL HIGH (ref 35.0–45.0)
pH, Arterial: 7.282 — ABNORMAL LOW (ref 7.350–7.450)
pH, Arterial: 7.339 — ABNORMAL LOW (ref 7.350–7.450)
pH, Arterial: 7.363 (ref 7.350–7.450)
pH, Arterial: 7.374 (ref 7.350–7.450)
pH, Arterial: 7.376 (ref 7.350–7.450)
pO2, Arterial: 123 mmHg — ABNORMAL HIGH (ref 80.0–100.0)
pO2, Arterial: 228 mmHg — ABNORMAL HIGH (ref 80.0–100.0)
pO2, Arterial: 73 mmHg — ABNORMAL LOW (ref 80.0–100.0)
pO2, Arterial: 75 mmHg — ABNORMAL LOW (ref 80.0–100.0)
pO2, Arterial: 81 mmHg (ref 80.0–100.0)

## 2010-09-11 LAB — CARDIAC PANEL(CRET KIN+CKTOT+MB+TROPI)
CK, MB: 2.6 ng/mL (ref 0.3–4.0)
Relative Index: INVALID (ref 0.0–2.5)
Total CK: 57 U/L (ref 7–232)
Troponin I: 0.37 ng/mL — ABNORMAL HIGH (ref 0.00–0.06)

## 2010-09-11 LAB — MRSA PCR SCREENING: MRSA by PCR: NEGATIVE

## 2010-09-11 LAB — CREATININE, SERUM
Creatinine, Ser: 0.84 mg/dL (ref 0.4–1.5)
Creatinine, Ser: 1.02 mg/dL (ref 0.4–1.5)
GFR calc Af Amer: >60 mL/min (ref >60)
GFR calc Af Amer: >60 mL/min (ref >60)
GFR calc non Af Amer: >60 mL/min (ref >60)
GFR calc non Af Amer: >60 mL/min (ref >60)

## 2010-09-11 LAB — BASIC METABOLIC PANEL
BUN: 10 mg/dL (ref 6–23)
BUN: 7 mg/dL (ref 6–23)
BUN: 8 mg/dL (ref 6–23)
CO2: 23 mEq/L (ref 19–32)
CO2: 26 mEq/L (ref 19–32)
CO2: 28 mEq/L (ref 19–32)
Calcium: 8.1 mg/dL — ABNORMAL LOW (ref 8.4–10.5)
Calcium: 8.2 mg/dL — ABNORMAL LOW (ref 8.4–10.5)
Calcium: 8.5 mg/dL (ref 8.4–10.5)
Chloride: 100 mEq/L (ref 96–112)
Chloride: 108 mEq/L (ref 96–112)
Chloride: 99 mEq/L (ref 96–112)
Creatinine, Ser: 0.89 mg/dL (ref 0.4–1.5)
Creatinine, Ser: 0.97 mg/dL (ref 0.4–1.5)
Creatinine, Ser: 1.05 mg/dL (ref 0.4–1.5)
GFR calc Af Amer: >60 mL/min (ref >60)
GFR calc Af Amer: >60 mL/min (ref >60)
GFR calc Af Amer: >60 mL/min (ref >60)
GFR calc non Af Amer: >60 mL/min (ref >60)
GFR calc non Af Amer: >60 mL/min (ref >60)
GFR calc non Af Amer: >60 mL/min (ref >60)
Glucose, Bld: 113 mg/dL — ABNORMAL HIGH (ref 70–99)
Glucose, Bld: 128 mg/dL — ABNORMAL HIGH (ref 70–99)
Glucose, Bld: 182 mg/dL — ABNORMAL HIGH (ref 70–99)
Potassium: 3.9 mEq/L (ref 3.5–5.1)
Potassium: 4.2 mEq/L (ref 3.5–5.1)
Potassium: 4.3 mEq/L (ref 3.5–5.1)
Sodium: 132 mEq/L — ABNORMAL LOW (ref 135–145)
Sodium: 133 mEq/L — ABNORMAL LOW (ref 135–145)
Sodium: 137 mEq/L (ref 135–145)

## 2010-09-11 LAB — COMPREHENSIVE METABOLIC PANEL
ALT: 22 U/L (ref 0–53)
AST: 18 U/L (ref 0–37)
Albumin: 3.2 g/dL — ABNORMAL LOW (ref 3.5–5.2)
Alkaline Phosphatase: 74 U/L (ref 39–117)
BUN: 10 mg/dL (ref 6–23)
CO2: 23 mEq/L (ref 19–32)
Calcium: 8.6 mg/dL (ref 8.4–10.5)
Chloride: 106 mEq/L (ref 96–112)
Creatinine, Ser: 1.01 mg/dL (ref 0.4–1.5)
GFR calc Af Amer: >60 mL/min (ref >60)
GFR calc non Af Amer: >60 mL/min (ref >60)
Glucose, Bld: 99 mg/dL (ref 70–99)
Potassium: 4 mEq/L (ref 3.5–5.1)
Sodium: 136 mEq/L (ref 135–145)
Total Bilirubin: 0.8 mg/dL (ref 0.3–1.2)
Total Protein: 5.6 g/dL — ABNORMAL LOW (ref 6.0–8.3)

## 2010-09-11 LAB — MAGNESIUM
Magnesium: 2.2 mg/dL (ref 1.5–2.5)
Magnesium: 2.2 mg/dL (ref 1.5–2.5)
Magnesium: 2.7 mg/dL — ABNORMAL HIGH (ref 1.5–2.5)

## 2010-09-11 LAB — HEPARIN LEVEL (UNFRACTIONATED): Heparin Unfractionated: <0.1 IU/mL — ABNORMAL LOW (ref 0.30–0.70)

## 2010-09-11 LAB — PROTIME-INR
INR: 1.24 (ref 0.00–1.49)
Prothrombin Time: 15.5 seconds — ABNORMAL HIGH (ref 11.6–15.2)

## 2010-09-11 LAB — PLATELET COUNT: Platelets: 175 10*3/uL (ref 150–400)

## 2010-09-11 LAB — POCT I-STAT GLUCOSE
Glucose, Bld: 120 mg/dL — ABNORMAL HIGH (ref 70–99)
Operator id: 3406

## 2010-09-11 LAB — APTT: aPTT: 33 seconds (ref 24–37)

## 2010-09-22 HISTORY — PX: CARDIAC CATHETERIZATION: SHX172

## 2010-10-14 ENCOUNTER — Inpatient Hospital Stay (HOSPITAL_COMMUNITY)
Admission: EM | Admit: 2010-10-14 | Discharge: 2010-10-18 | DRG: 281 | Disposition: A | Discharge status: Home / Self Care | Payer: Self-pay | Attending: Emergency Medicine | Admitting: Emergency Medicine

## 2010-10-14 ENCOUNTER — Emergency Department (HOSPITAL_COMMUNITY): Payer: Self-pay

## 2010-10-14 DIAGNOSIS — F411 Generalized anxiety disorder: Secondary | ICD-10-CM | POA: Diagnosis present

## 2010-10-14 DIAGNOSIS — I25810 Atherosclerosis of coronary artery bypass graft(s) without angina pectoris: Secondary | ICD-10-CM | POA: Diagnosis present

## 2010-10-14 DIAGNOSIS — E119 Type 2 diabetes mellitus without complications: Secondary | ICD-10-CM | POA: Diagnosis present

## 2010-10-14 DIAGNOSIS — D72829 Elevated white blood cell count, unspecified: Secondary | ICD-10-CM | POA: Diagnosis present

## 2010-10-14 DIAGNOSIS — I10 Essential (primary) hypertension: Secondary | ICD-10-CM | POA: Diagnosis present

## 2010-10-14 DIAGNOSIS — I2589 Other forms of chronic ischemic heart disease: Secondary | ICD-10-CM | POA: Diagnosis present

## 2010-10-14 DIAGNOSIS — I214 Non-ST elevation (NSTEMI) myocardial infarction: Principal | ICD-10-CM | POA: Diagnosis present

## 2010-10-14 DIAGNOSIS — I2582 Chronic total occlusion of coronary artery: Secondary | ICD-10-CM | POA: Diagnosis present

## 2010-10-14 DIAGNOSIS — E785 Hyperlipidemia, unspecified: Secondary | ICD-10-CM | POA: Diagnosis present

## 2010-10-14 LAB — CARDIAC PANEL(CRET KIN+CKTOT+MB+TROPI)
CK, MB: 50.5 ng/mL (ref 0.3–4.0)
Relative Index: 16.9 — ABNORMAL HIGH (ref 0.0–2.5)
Total CK: 299 U/L — ABNORMAL HIGH (ref 7–232)

## 2010-10-14 LAB — BASIC METABOLIC PANEL
BUN: 11 mg/dL (ref 6–23)
CO2: 24 mEq/L (ref 19–32)
Chloride: 104 mEq/L (ref 96–112)
GFR calc non Af Amer: >60 mL/min (ref >60)
Glucose, Bld: 150 mg/dL — ABNORMAL HIGH (ref 70–99)
Potassium: 4.4 mEq/L (ref 3.5–5.1)
Sodium: 134 mEq/L — ABNORMAL LOW (ref 135–145)

## 2010-10-14 LAB — CBC
HCT: 43.9 % (ref 39.0–52.0)
Hemoglobin: 15.4 g/dL (ref 13.0–17.0)
MCH: 32.8 pg (ref 26.0–34.0)
MCV: 93.6 fL (ref 78.0–100.0)
RBC: 4.69 MIL/uL (ref 4.22–5.81)
WBC: 18.3 10*3/uL — ABNORMAL HIGH (ref 4.0–10.5)

## 2010-10-14 LAB — CK TOTAL AND CKMB (NOT AT ARMC): Relative Index: 13.9 — ABNORMAL HIGH (ref 0.0–2.5)

## 2010-10-14 LAB — DIFFERENTIAL
Eosinophils Absolute: 0.1 10*3/uL (ref 0.0–0.7)
Lymphocytes Relative: 12 % (ref 12–46)
Lymphs Abs: 2.2 10*3/uL (ref 0.7–4.0)
Monocytes Relative: 5 % (ref 3–12)
Neutro Abs: 15 10*3/uL — ABNORMAL HIGH (ref 1.7–7.7)
Neutrophils Relative %: 82 % — ABNORMAL HIGH (ref 43–77)

## 2010-10-14 LAB — URINALYSIS, ROUTINE W REFLEX MICROSCOPIC
Bilirubin Urine: NEGATIVE
Glucose, UA: NEGATIVE mg/dL
Hgb urine dipstick: NEGATIVE
Ketones, ur: NEGATIVE mg/dL
Nitrite: NEGATIVE
pH: 6 (ref 5.0–8.0)

## 2010-10-14 LAB — TSH: TSH: 3.129 u[IU]/mL (ref 0.350–4.500)

## 2010-10-14 LAB — PROTIME-INR: INR: 1.01 (ref 0.00–1.49)

## 2010-10-14 LAB — MRSA PCR SCREENING: MRSA by PCR: NEGATIVE

## 2010-10-15 LAB — POCT ACTIVATED CLOTTING TIME: Activated Clotting Time: 105 seconds

## 2010-10-15 LAB — CBC
Hemoglobin: 14.9 g/dL (ref 13.0–17.0)
MCHC: 34.8 g/dL (ref 30.0–36.0)
RBC: 4.62 MIL/uL (ref 4.22–5.81)
WBC: 16.4 10*3/uL — ABNORMAL HIGH (ref 4.0–10.5)

## 2010-10-15 LAB — BASIC METABOLIC PANEL
Chloride: 106 mEq/L (ref 96–112)
Creatinine, Ser: 1.08 mg/dL (ref 0.4–1.5)
GFR calc Af Amer: >60 mL/min (ref >60)
Potassium: 4.5 mEq/L (ref 3.5–5.1)
Sodium: 139 mEq/L (ref 135–145)

## 2010-10-15 LAB — DIFFERENTIAL
Basophils Absolute: 0 10*3/uL (ref 0.0–0.1)
Basophils Relative: 0 % (ref 0–1)
Lymphocytes Relative: 31 % (ref 12–46)
Lymphs Abs: 5 10*3/uL — ABNORMAL HIGH (ref 0.7–4.0)
Monocytes Relative: 7 % (ref 3–12)

## 2010-10-15 LAB — LIPID PANEL
Cholesterol: 144 mg/dL (ref 0–200)
LDL Cholesterol: 62 mg/dL (ref 0–99)
Total CHOL/HDL Ratio: 3.7 RATIO

## 2010-10-15 LAB — GLUCOSE, CAPILLARY: Glucose-Capillary: 105 mg/dL — ABNORMAL HIGH (ref 70–99)

## 2010-10-15 LAB — CARDIAC PANEL(CRET KIN+CKTOT+MB+TROPI)
CK, MB: 46.5 ng/mL (ref 0.3–4.0)
Relative Index: 17.3 — ABNORMAL HIGH (ref 0.0–2.5)

## 2010-10-15 LAB — HEPARIN LEVEL (UNFRACTIONATED): Heparin Unfractionated: 0.23 IU/mL — ABNORMAL LOW (ref 0.30–0.70)

## 2010-10-16 LAB — CARDIAC PANEL(CRET KIN+CKTOT+MB+TROPI)
Relative Index: INVALID (ref 0.0–2.5)
Total CK: 94 U/L (ref 7–232)

## 2010-10-16 LAB — CBC
HCT: 43.5 % (ref 39.0–52.0)
MCV: 92.2 fL (ref 78.0–100.0)
RBC: 4.72 MIL/uL (ref 4.22–5.81)
WBC: 11.5 10*3/uL — ABNORMAL HIGH (ref 4.0–10.5)

## 2010-10-16 LAB — BASIC METABOLIC PANEL
Chloride: 105 mEq/L (ref 96–112)
GFR calc Af Amer: >60 mL/min (ref >60)
Potassium: 4.1 mEq/L (ref 3.5–5.1)

## 2010-10-16 LAB — GLUCOSE, CAPILLARY
Glucose-Capillary: 164 mg/dL — ABNORMAL HIGH (ref 70–99)
Glucose-Capillary: 138 mg/dL — ABNORMAL HIGH (ref 70–99)
Glucose-Capillary: 145 mg/dL — ABNORMAL HIGH (ref 70–99)

## 2010-10-17 LAB — BASIC METABOLIC PANEL
BUN: 7 mg/dL (ref 6–23)
Calcium: 9.6 mg/dL (ref 8.4–10.5)
GFR calc non Af Amer: >60 mL/min (ref >60)
Glucose, Bld: 101 mg/dL — ABNORMAL HIGH (ref 70–99)

## 2010-10-17 LAB — CBC
HCT: 42.6 % (ref 39.0–52.0)
MCHC: 35.4 g/dL (ref 30.0–36.0)
MCV: 93 fL (ref 78.0–100.0)
Platelets: 214 10*3/uL (ref 150–400)
RDW: 13 % (ref 11.5–15.5)

## 2010-10-18 LAB — GLUCOSE, CAPILLARY
Glucose-Capillary: 129 mg/dL — ABNORMAL HIGH (ref 70–99)
Glucose-Capillary: 106 mg/dL — ABNORMAL HIGH (ref 70–99)

## 2010-10-20 NOTE — Cardiovascular Report (Signed)
NAMEMARGARET, [name]             ACCOUNT NO.:  [id number]  MEDICAL RECORD NO.:  [id number]           PATIENT TYPE:  I  LOCATION:  2927                         FACILITY:  [facility]  PHYSICIAN:  Thurmon Fair, MD     DATE OF BIRTH:  03-16-1951  DATE OF PROCEDURE: DATE OF DISCHARGE:                           CARDIAC CATHETERIZATION   PROCEDURES PERFORMED: 1. Left heart catheterization. 2. Selective coronary angiography. 3. Saphenous vein graft and left internal mammary artery bypass. 4. Left ventriculogram.  REASON FOR THE PROCEDURE:  Non ST-segment elevation myocardial infarction.  Jason [name] is a 60 year old man who is now roughly 67-month status post multivessel coronary artery bypass surgery.  He has a previous history of multiple percutaneous revascularization procedures in the right coronary artery and in February 2011 underwent coronary artery bypass surgery x4 (LIMA to LAD, SVG to PDA, sequential SVG to ramus and left circumflex coronary artery).  After risks and benefits of the procedure were described, the patient provided informed consent, was brought to the cardiac cath lab in fasting state and prepped and draped in usual sterile fashion using 1% lidocaine, local anesthesia was administered to the left groin area. Using the modified Seldinger technique, a 5-French right common femoral artery sheath was introduced without difficulty.  Using JL-4 catheter, selective coronary angiography of the left coronary artery was performed.  JR catheter was then used to perform angiograms of the native right coronary artery, saphenous vein graft to the right coronary artery, and sequential saphenous vein graft to the ramus intermedius and left circumflex coronary arteries.  The left subclavian artery could not be selectively cannulated with the JR catheter, but this was successfully performed with some difficulty using the IM catheter.  Selective angiograms of the LIMA bypass  were performed.  Subsequently, an angled pigtail catheter was advanced to the left ventricular cavity to record pressures, the left ventricle and aorta pullback as well as to perform a left ventriculogram in the RAO projection.  No immediate complications occurred.  FINDINGS: 1. The left main coronary artery has an approximately 80% ostial     stenosis.  It trifurcates into the LAD artery, ramus intermedius     artery, and left circumflex coronary artery. 2. Left anterior descending artery has an approximately 50% stenosis     following the first septal and first diagonal branches.  There is a     90% stenosis in the mid LAD artery.  In the distal LAD, there is     competitive flow seen from the LIMA bypass.  Moderately developed     collaterals to the distal branch of the right coronary artery was     seen primarily from the mid LAD and AV groove vessel. 3. The ramus intermedius artery is subtotally occluded shortly     following its ostium.  The distal ramus intermedius fills via the saphenous vein graft bypass. 1. The left circumflex coronary artery is a severely diseased vessel.     The only remnant is a small AV groove vessel.  The major oblique     marginal artery is subtotally occluded and fills via saphenous vein  graft bypass. 2. The right coronary artery has extensive areas of previous stenting.     There was severe in-stent restenosis and total occlusion of the     right coronary artery roughly in the middle portion of its mid     segment. 3. The saphenous vein graft bypass to right coronary artery is totally     occluded almost immediately following the proximal anastomosis. 4. I guess the sequential saphenous vein graft to the ramus     intermedius artery and oblique marginal artery is an excellent     healthy vessel that is widely patent and free of visible disease.     It supplies a good flow to a reasonably healthy distal ramus     intermedius vessel and OM1  vessel. 5. The LIMA to the LAD bypass is widely patent and healthy-appearing     vessel.  The distal LAD itself is diffusely diseased vessel but has     excellent inflow. 6. The left ventricle was normal in size.  There was akinesis of the     basal segment of the inferior wall and severe hypokinesis of the     mid segment of the inferior wall.  Other wall segments contract     normally and the overall left ventricular ejection fraction was     about 45%.  Left ventricle end-diastolic pressure is 11 mmHg.     There is no evidence of aortic stenosis or mitral regurgitation.  CONCLUSION:  Jason [name] has suffered a non-ST-segment elevation myocardial infarction due to total occlusion of both the native right coronary artery and the saphenous vein graft bypass to the right coronary artery.  It is unclear in which order the occlusions occurred. Both have angiographic appearance suggestive of a chronic abnormality.  Neither vessel appears amenable to percutaneous revascularization.  There is moderate collateral flow from the left coronary system and there is probably still some viable inferior wall myocardium.  There are no good alternatives for either a surgical repair or revascularization at this time.  Medical therapy is recommended.     Thurmon Fair, MD     MC/MEDQ  D:  10/15/2010  T:  10/16/2010  Job:  [id number]  cc:   [facility]  Electronically Signed by Thurmon Fair M.D. on 10/20/2010 12:16:50 PM

## 2010-10-31 NOTE — Discharge Summary (Signed)
NAMELAYDEN, CATERINO             ACCOUNT NO.:  [id number]  MEDICAL RECORD NO.:  [id number]           PATIENT TYPE:  I  LOCATION:  3714                         FACILITY:  [facility]  PHYSICIAN:  Thurmon Fair, MD     DATE OF BIRTH:  [birth year]  DATE OF ADMISSION:  10/14/2010 DATE OF DISCHARGE:  10/18/2010                              DISCHARGE SUMMARY   DISCHARGE DIAGNOSES: 1. Non-ST-elevation myocardial infarction.  Status post left heart     cath, multivessel coronary disease, noncritical occlusion on     medical management. 2. Coronary artery bypass grafting in 2011. 3. Hypertension. 4. Hyperlipidemia. 5. Diabetes mellitus type 2. 6. Tobacco abuse. 7. Anxiety disorder. 8. Leukocytosis question dental in origin, amoxicillin. 9. Ischemic cardiomyopathy, ejection fraction of 45%, cath and by echo     this admission.  HOSPITAL COURSE:  Mr. Jason [name] is 60 year old Caucasian male with a history of coronary disease status post coronary artery bypass graft x 4 in July 25, 2009.  The patient had a LIMA to LAD, SVG to the ramus and circumflex, marginal SVG to the PDA and SVG to RCA.  His history also includes hypertension, hyperlipidemia, anxiety, hepatic attack, right bundle-branch block, diabetes mellitus, insomnia, tobacco abuse. He was last been seen in the office on August 29, 2010, and has been hospitalized prior to that for 6 weeks for chest pain.  He had a recent [employer], showed no evidence of ischemia and normal LV function. He presented to [facility] with chest pain which is on 4-5/10 intensity, substernal, he was admitted to rule out acute coronary syndrome, was scheduled for left heart catheterization, started on IV heparin, IV nitroglycerin, and admitted to step-down, continued his home meds, which included beta-blocker, aspirin, statin.  His troponin peaked out at 5.81, CK-MB of 50.5.  Both were trending down.  On the October 15, 2010, he had no complaints of chest  pain.  Cardiac cath showed total occlusion of both the native right coronary artery and saphenous vein graft, bypass to the right coronary artery.  It was unclear under which artery the occlusions occurred.  Both are angiographic suggesting chronic abnormality in a vessel amenable to percutaneous revascularization. There was moderate collateral flow from the left coronary system and there was probably still some viable inferior wall myocardium.  There was no good alternative either surgical repair or revascularization at this time.  Medical therapy is recommended.  Cardiac rehab was initiated.  The patient was transferred to telemetry, 2-D echocardiogram was ordered, and patient continued without complaints of chest pain. Echocardiogram showed ejection fraction approximately 45% inferior septal hypokinesis.  The patient had been seen by Dr. Royann Shivers, feels he is stable for discharge home.  DISCHARGE LABORATORY DATA:  WBC is 14.5, hemoglobin 15.1, hematocrit 42.6, platelets 214.  Sodium 138, potassium 3.3, chloride 101, carbon dioxide 25, glucose 101, BUN 7, creatinine 1.01, calcium 9.6.  BNP was 77, peak troponin 5.81, peak CK-MB 60.5.  Peak creatine kinase total of 299.  Total cholesterol 144, triglycerides 215, HDL 35, LDL 62, VLDL 43, total cholesterol and HDL ratio was 43.7.  Urinalysis was negative for acute  disease.  TSH was 3.129.  MRSA negative.  STUDIES/PROCEDURES: 1. Chest x-ray, October 14, 2010, no acute cardiopulmonary findings. 2. Cardiac catheterization on October 15, 2010, findings, left main     coronary has approximately 80% ostial stenosis.  It trifurcates     into the LAD artery, ramus intermedius artery, and left circumflex     coronary artery.  Left anterior descending artery has approximately     50%stenosis following the first septal and first diagonal branches.     There is 90% stenosis in the mid LAD artery.  In the distal LAD,     there is a evidence of flow seen  from the LIMA bypass.  Moderately     developed collaterals to the distal branch in right coronary was     seen primarily from the mid LAD and AV groove vessel.  The ramus     intermedius artery was subtotally occluded shortly following the     ostium, and distal ramus intermedius fills via the saphenous vein     graft bypass.  The left circumflex coronary artery was severely     diseased vessel.  The only problem area is the small AV groove     vessel.  The major oblique marginal artery was subtotally occluded     as well as via saphenous vein graft bypass.  The right coronary     artery has extensive areas of previous stenting.  There was severe     in-stent restenosis and total occlusion of the right coronary     roughly and then middle portion of its mid segment.  Saphenous vein     graft bypass in the right coronary artery was totally occluded,     similarly following proximal anastomosis.  A sequential saphenous     vein graft to the ramus intermediate artery and oblique marginal     arteries and excellent healthy vessel that is widely patent and     free of visible disease, despite a good flow to the reasonably     healthy distal ramus, intermediate vessel, and OM1 vessel.  The     LIMA to the LAD bypass was widely patent and healthy-appearing     vessel.  The distal LAD itself is acutely diseased vessel, but has     excellent inflow.  The left ventricle was normal size, it is     akinesis of basal segment of inferior wall and had severe     hypokinesis of the mid segment of the inferior wall.  Otherwise,     segments contract normally and overall left ventricular ejection     fraction was 45%, left ventricle end-diastolic pressure was 11     mmHg.  There was no evidence of aortic stenosis or mitral     regurgitation. 3. A 2-D echocardiogram which revealed ejection fraction of     approximately 45% in the inferior septal hypokinesis.  DISCHARGE MEDICATIONS: 1. Amoxicillin 500 mg  one tablet mouth 3 times a day for 9 days. 2. Plavix 75 mg 1 tablet by mouth daily with meal. 3. Imdur or isosorbide mononitrate 30 mg 1 tablet by mouth daily. 4. Lisinopril 10 mg 1 tablet by mouth daily. 5. Alprazolam 0.5 mg 1 tablet by mouth twice daily as needed for     anxiety. 6. Aspirin enteric-coated 325 mg 1 tablet by mouth daily. 7. Cyclobenzaprine 10 mg 1-2 tablets by mouth daily at bedtime. 8. Metformin 1000 mg 2 tablets by mouth twice  daily. 9. Metoprolol tartrate 25 mg 1 tablet by mouth twice daily. 10.Nitroglycerin sublingual 0.4 mg 1 tablet under the tongue every 5     minutes 3 doses for chest pain. 11.Percocet 10/325 mg 1 tablet by mouth every 6 hours as needed for     pain. 12.Pravastatin 40 mg 1 tablet by mouth daily at bedtime. 13.Paroxetine 20 mg 1 tablet by mouth daily. 14.Tramadol 50 mg 1 tablet by mouth every 6 hours as needed for pain.  DISPOSITION:  Mr. Feister will be discharged home in stable condition. He is recommended to increase his activity slowly as well as eat a heart- healthy and low-sodium diet.  It is recommended he does not do lifting for greater than 10 pounds for 2 days and no driving for 2 days.  If the catheter site becomes red, painful, swollen, discharge of fluid or pus, he is to call our office.  He will follow up with Dr. Alanda Amass, at our office on Tuesday, Oct 29, 2010, at 10:45 a.m., and is recommended, he refrain from working for approximately 1 week, returning on Oct 25, 2010.    ______________________________ Wilburt Finlay, PA   ______________________________ Thurmon Fair, MD    BH/MEDQ  D:  10/18/2010  T:  10/19/2010  Job:  [id number]  Electronically Signed by Wilburt Finlay PA on 10/30/2010 12:59:14 PM Electronically Signed by Thurmon Fair M.D. on 10/31/2010 04:07:21 PM

## 2010-11-05 NOTE — Discharge Summary (Signed)
NAMESAMEL, Jason             ACCOUNT NO.:  [id number]   MEDICAL RECORD NO.:  [id number]          PATIENT TYPE:  INP   LOCATION:  1518                         FACILITY:  [facility]   PHYSICIAN:  Marcellus Scott, MD     DATE OF BIRTH:  18-Apr-1951   DATE OF ADMISSION:  05/24/2008  DATE OF DISCHARGE:                               DISCHARGE SUMMARY   PRIMARY MEDICAL DOCTOR:  [facility]   DISCHARGE DIAGNOSES:  1. Acute on chronic bronchitis.  2. ? Viral syndrome.  3. Pleuritic chest pain secondary to acute bronchitis.  4. Chronic chest pain in patient with coronary artery disease, status      post stent.  Will be scheduled for outpatient Myoview by      [facility].  5. Poorly-controlled type 2 diabetes.  6. Thrombocytopenia--resolved.  7. Tobacco abuse.  8. Hypertension.   DISCHARGE MEDICATIONS:  1. Tamiflu 75 mg p.o. b.i.d.  2. Avelox 400 mg p.o. daily.  3. Enteric-coated aspirin 325 mg p.o. daily.  4. NovoLog sliding scale insulin.  5. Pravastatin 10 mg p.o. at bedtime.  6. Lantus 10 units subcutaneously at bedtime.  7. Metformin 500 mg p.o. b.i.d.  8. Combivent inhaler 1-2 puffs inhaled q.i.d. p.r.n.   The Tamiflu, Avelox, Combivent inhaler and a bottle of Lantus and  NovoLog insulin each will be provided through the hospital pharmacy.  Regarding the rest of the medications, patient has been advised to fill  up at [facility] $4.00 prescriptions.   PROCEDURES:  1. Chest x-ray on 05/25/2008:  Impression:  Stable diffuse central      airway thickening compatible with bronchitis.  No evidence of      pneumonia.  2. Chest x-ray on 05/24/2008:  Impression:  Bronchitic changes and      probable developing right lower lobe infiltrate.  3. A 2-D echocardiogram on 05/26/2008:  Overall left ventricular      systolic function was normal.  Left ventricular ejection fraction      was estimated to be 60%.  Left ventricular diastolic function   parameters were normal.  Aortic valve thickness was mildly to      moderately increased.  The aortic valve was mildly calcified.   PERTINENT LABORATORIES:  CBC today with hemoglobin of 13.4, hematocrit  38.1, white blood cells 4.9, platelets 167.  TSH was 3.002.  Blood  cultures x2 on 05/25/2008:  No growth to date.  Basic metabolic panel  yesterday was unremarkable except for glucose of 325, BUN 10, creatinine  1.18.  Hemoglobin A1c was 12.2.  Cardiac enzymes were negative.  Lipid  panel was remarkable for HDL of 25, LDL 111.  BNP was 44.6.  Urine drug  screen was negative.   CONSULTATIONS:  Cardiology, Dr. Hattie Perch COURSE AND PATIENT DISPOSITION:  Jason [name] is a very pleasant  60 year old gentleman with history of coronary artery disease, status  post drug-eluting stents in 2004, hypertension, diabetes, depression,  dyslipidemia who has lost his source of income and has been struggling  to make ends meet, and, hence, has been noncompliant with his  Plavix and  other medications for some time now.  He has intermittent exertional  angina.  He continues to smoke.  He presented this time with worsening  dry hacking cough, rhinorrhea, fever, chills and body aches.  He tried  self-medicating himself without success.  In the emergency room, he was  found to have temperature of 101.5 degrees F.  He was admitted for  further evaluation and management.  1. Acute on chronic bronchitis.  Patient was admitted to telemetry bed      with no significant arrhythmia runs.  He was placed on droplet      isolation.  He was empirically treated with Avelox and Tamiflu.      Repeat chest x-rays were not revealing for pneumonia.  With these      measures, patient has done significantly well.  He has now some      residual cough with pleuritic chest pain which he says is 80%      better.  He has no dyspnea.  He has defervesced.  He will complete      a 5-day course of Avelox and Tamiflu.   He has been advised to use      the loop facemask for a few days around his family members until      his cough subsides.  He has been instructed to seek medical      attention if there is deterioration in his condition.  2. Questionable viral syndrome.  Management as above.  3. Pleuritic chest pain secondary to acute bronchitis.  Improving.  4. Chronic chest pain in a patient with coronary artery disease,      status post drug-eluting stents.  Cardiology consult was obtained.      They followed the patient and have recommended an outpatient      Myoview.  Dr. Clayborne Dana office will call the patient with      appointment for the same to be done next week.  The patient has      been started on his aspirin, statins.  He has been given      prescriptions that he can fill at [facility], $4.00 prescriptions.  5. Tobacco abuse.  Patient declines a nicotine patch and says he will      use a nicotine gum if needed.  Cessation counseling was done.  6. Type 2 diabetes, obviously uncontrolled as an outpatient.  Patient      will be discharged on Lantus sliding scale and metformin.  He has      an appointment to be followed up at [facility] on 07/04/2008 at      9:00 a.m. with Dr. Dow Adolph.  His diabetes obviously has      to be tightly controlled.   At this time, patient is stable and can be discharged home.      Marcellus Scott, MD  Electronically Signed     AH/MEDQ  D:  05/27/2008  T:  05/27/2008  Job:  [id number]   cc:   Nanetta Batty, M.D.  Fax: [phone]   Maurice March, M.D.  Fax: [phone]

## 2010-11-05 NOTE — H&P (Signed)
NAMEDOROTEO, NICKOLSON NO.:  [id number]   MEDICAL RECORD NO.:  [id number]          PATIENT TYPE:  EMS   LOCATION:  ED                           FACILITY:  [facility]   PHYSICIAN:  Vania Rea, M.D. DATE OF BIRTH:  11-29-1950   DATE OF ADMISSION:  05/24/2008  DATE OF DISCHARGE:                              HISTORY & PHYSICAL   PRIMARY CARE PHYSICIAN:  Unassigned.   CHIEF COMPLAINT:  Chest pain.   HISTORY OF PRESENT ILLNESS:  This is a 60 year old Caucasian gentleman  with a history of coronary artery disease, status post stenting in 2004,  currently noncompliant with Plavix, who also has a history of  hypertension, diabetes and depression, who is noncompliant with all  medications because of financial issues, who reports that he is usually  able to control his blood sugars in the range of 80-120 with diet and  has been in fair health until last night when he developed sudden onset  of chest pains associated with palpitations, nausea and diaphoresis.  He  also started having a dry hacking cough associated with runny nose,  fever, chills and generalized body aches.  The patient reports that he  has been self-medicating with aspirin, Tylenol and ibuprofen without  success and eventually came to the emergency room to be evaluated.  The  patient has been evaluated with cardiac enzymes and chest x-rays, as  well as the usual baseline monitoring.  His cardiac enzymes have been  persistently negative.  He has been afebrile.  His blood work has been  essentially normal.  However, the chest x-ray suggested bronchitis and  early pneumonia.  The hospitalist service was called to assist with  management.   The patient reports that he has taken no Plavix for the past 12 months.  He reports that his blood sugars have been in 300-400 range the past few  weeks, but usually is less than 120.  He reports polyuria, polydipsia,  nausea, blurring of vision, runny nose, body aches,  fever, chills and  chest tightness.  He denies dyspnea on exertion or lower extremity  edema.   PAST MEDICAL HISTORY:  1. Diabetes.  2. Hyperlipidemia.  3. Hypertension.  4. Diabetes.  5. Depression.  6. Coronary artery disease, status post stenting x2.  7. History of thrombectomy of the right femoral artery, status post      traumatic injury.   MEDICATIONS:  Tylenol, aspirin and ibuprofen p.r.n.   ALLERGIES:  SULFA.  He cannot remember the reaction.   SOCIAL HISTORY:  He continues to smoke 1 to 1-1/2 packs per day.  Alcohol - drinks approximately one beer per month.  Denies marijuana,  cocaine or illicit drug use.  Works as a Education officer, environmental.   FAMILY HISTORY:  Significant for coronary artery disease in his father  and his mother is on multiple medications, but he does not what for.   REVIEW OF SYSTEMS:  Other than noted above, a 10-point review of systems  is unremarkable.   PHYSICAL EXAMINATION:  GENERAL:  A well-built middle-aged Caucasian  gentleman reclining on a stretcher in no acute distress.  VITAL SIGNS:  His temperature is 98.0, pulses 93, respirations 20, blood pressure  109/55.  He is saturating at 96% on room air.  HEENT:  His pupils are round, equal and reactive.  Mucous membranes pink  and anicteric.  NECK:  He has no cervical lymphadenopathy.  No thyromegaly no  jugulovenous distention.  CHEST:  Clear to auscultation bilaterally.  CARDIOVASCULAR:  Regular rhythm without murmur.  ABDOMEN:  Scaphoid, soft and nontender.  EXTREMITIES:  Without edema.  He has 3+ pulses bilaterally.  MUSCULOSKELETAL:  Well-developed.  There is no wasting.  He has no bony  joint deformity.  CENTRAL NERVOUS SYSTEM:  Cranial nerves II-XII are  grossly intact.  He has no lateralizing signs.   LABORATORY DATA:  His CBC is completely normal.  His serum chemistry is  significant only for a sodium of 132, glucose of 304, it is otherwise  completely normal with normal liver  functions.  His cardiac enzymes are  likewise normal with normal myoglobin, undetectable CK-MB or troponins.  EKG shows normal sinus rhythm with reportedly no ST-segment changes.  Chest x-ray shows cardiac silhouette within normal limits.  Coronary  stents noted.  Mild bronchitic type changes.  Possible developing right  lower lobe infiltrate, no effusion   ASSESSMENT:  1. Chest tightness with diaphoresis and shortness of breath in a      middle-aged gentleman who is noncompliant with Plavix.  2. Acute viral syndrome characterized by a subjective fever, chills,      runny nose, body aches and cough.  3. Diabetes type 2, uncontrolled.  4. History of hypertension, currently normotensive.  5. History of depression with normal affect.   PLAN:  Agree with treating this gentleman for acute bronchitis.  Agree  with covering him for H1N1 with Tamiflu.  Because of his uncontrolled  diabetes and his cardiac history of noncompliance with meds despite  negative cardiac enzymes at this time, we will go ahead and admit him  for formal serial cardiac enzymes.  We will check his hemoglobin A1c and  if it is still true that he usually keeps his blood sugars in the range  on diet, we may be able to control him with metformin alone.  Hopefully,  this gentleman can be discharged within 24 hours.  Other plans as per  orders.      Vania Rea, M.D.  Electronically Signed     LC/MEDQ  D:  05/24/2008  T:  05/24/2008  Job:  [id number]

## 2010-11-05 NOTE — Assessment & Plan Note (Signed)
OFFICE VISIT   TIJUAN, DANTES  DOB:  Nov 13, 1950                                        August 15, 2009  CHART #:  [id number]   HISTORY:  The patient comes in today for a 3-week postoperative  followup.  He is status post coronary artery bypass grafting x4 on  July 24, 2009.  His postoperative course was overall uneventful.  Since he is discharged home, he is continued to slowly progress.  He is  doing some walking daily and has noted some increase in his exercise  tolerance.  He does still fatigue easily and continues to have some  residual incisional pain.  He is currently out of pain medication and is  having some difficulty sleeping without it.  He denies any shortness of  breath or lower extremity edema.  He was originally scheduled to follow  up with Dr. Alanda Amass this week but the [facility] did call and  reschedule this appointment and he will see him next week instead.  His  blood sugars have stayed fairly well controlled.   PHYSICAL EXAMINATION:  Vital Signs:  Blood pressure is 105/63, pulse is  72, respirations 18, O2 sat 98% on room air.  His sternal and chest tube  incisions have healed well.  The sternum is stable to palpation.  Heart  is regular rate and rhythm without murmurs, rubs or gallops.  Lungs are  clear to auscultation.  Lower extremities show a well healed right EVH  incision.  He has no lower extremity edema on physical exam.  Chest x-  ray shows no acute changes with resolved tiny pleural effusions and no  pneumothorax.   ASSESSMENT AND PLAN:  The patient is doing well status post coronary  artery bypass graft.  He was seen by Dr. Donata Clay today, and we will  plan on releasing him from our care at this point.  He is asked to  follow up as directed with Dr. Alanda Amass.  We have also given him a  return-to-work note so that he may resume light duties until April 11,  at which point he can resume his full duties.   At this point, he also  may begin to drive.  We have discussed the possibility of cardiac rehab,  but the patient does not have insurance at this time and will prefer to  proceed with walking regimen on his own.  He will slowly increase his  distance daily as tolerated.  He currently does not have a primary care  physician but has previously been followed  by [facility] at [facility] and hopefully can get  back in with him for followup of his diabetes.  We have asked him to  call if we can be of any further assistance.   Kerin Perna, M.D.  Electronically Signed   GC/MEDQ  D:  08/15/2009  T:  08/16/2009  Job:  [id number]   cc:   Gerlene Burdock A. Alanda Amass, M.D.  [facility] Office

## 2010-11-08 NOTE — Cardiovascular Report (Signed)
NAME:  Jason [name], Jason [name]                       ACCOUNT NO.:  [id number]   MEDICAL RECORD NO.:  [id number]                   PATIENT TYPE:  INP   LOCATION:  2019                                 FACILITY:  [facility]   PHYSICIAN:  Meade Maw, M.D.                 DATE OF BIRTH:  1951/01/24   DATE OF PROCEDURE:  DATE OF DISCHARGE:                              CARDIAC CATHETERIZATION   PROCEDURE PERFORMED:   CARDIOLOGIST:  Meade Maw, M.D.   INDICATIONS FOR PROCEDURE:  Inferior myocardial infarction.   DESCRIPTION OF PROCEDURE:  After obtaining written informed consent the  patient was brought to the cardiac catheterization lab in the postabsorptive  state.  Preop sedation was achieved using IV Versed.  The right groin was  prepped and draped in the usual sterile fashion.  Local anesthesia was  achieved using 1% Xylocaine and a 6 [place] hemostasis sheath was placed into  the right femoral artery using the modified Seldinger technique.  Selective  coronary angiography was performed using a JL4 and a JR4 Judkins catheters.  Multiple views were obtained.  All catheter exchanges were made over a guide  wire.  Single-plane ventriculogram was performed in the RAO position using a  6 French curved pigtail catheter.  All catheter exchanges were made over a  guide wire.  The hemostasis sheath was flushed following each engagement.  The patient was given Versed 1 mg IV at the initiation of the procedure and  his systolic blood pressure dropped from 1-4 to 80.  His fluids were opened  up and his Versed was reversed with Romazicon as systolic pressure improved  to 90 by the end of the procedure.   The patient was noted to have a proximal-to-mid occlusion of the right  coronary artery.  Dr. Amil Amen was consulted. The sheath was sewn into place.  He will review the films to decide further intervention.   FINDINGS:  PRESSURES:  The aortic pressure was 95/59.  LV pressure 77/20.   VENTRICULOGRAM:  Single-plane ventriculogram revealed inferior akinesis with  an ejection fraction of 45-50%.   CORONARY ANGIOGRAPHY:  Left Main Coronary Artery:  The left main coronary  artery trifurcates into the left anterior descending, ramus and circumflex  vessel.  There is distal calcification in the left main coronary artery.  There is approximately a 20% distal lesion.   Left Anterior Descending:  Left anterior descending gives rise to small D-1,  a moderate-to-large D-2, a small D-3 and goes on as an apical branch.  There  are luminal irregularities up to 20-30% in the left anterior descending.  There are luminal irregularities up to 40% in the second diagonal.   Ramus Vessel:  The ramus vessel is a large vessel and has luminal  irregularities up to 20-30%.   Circumflex Vessel:  The circumflex vessel has a 40-50% long lesion noted.   Right Coronary Artery:  The right coronary artery has  occluded at its mid-to-  proximal portion. There are collaterals as noted above from the left system.   FINAL IMPRESSION:  1. Proximal-to-mid total occlusion of the right coronary artery associated     with inferior myocardial infarction.  2. The patient has evidence of previous coronary artery disease as     demonstrated from the collaterals from the left system.  3. Ejection fraction of 45-50% with inferior akinesis noted.   Dr. Amil Amen was consulted.  The hemostasis sheath was sewn into place.   The patient will be transferred to the holding area.                                                 Meade Maw, M.D.    HP/MEDQ  D:  07/15/2002  T:  07/16/2002  Job:  [id number]   cc:   Armanda Magic, M.D.  [address], [address]  [place], [place] [address]  Fax: [phone]   Dellis Anes. Idell Pickles, M.D.  [address]  [place]  [place] [address]  Fax: [phone]

## 2010-11-08 NOTE — Cardiovascular Report (Signed)
NAMEPARTICK, MUSSELMAN             ACCOUNT NO.:  [id number]   MEDICAL RECORD NO.:  [id number]          PATIENT TYPE:  INP   LOCATION:  3305                         FACILITY:  [facility]   PHYSICIAN:  Armanda Magic, M.D.     DATE OF BIRTH:  Jun 24, 1950   DATE OF PROCEDURE:  10/09/2004  DATE OF DISCHARGE:                              CARDIAC CATHETERIZATION   REFERRING PHYSICIAN:  Dr. Foye Deer   PROCEDURE:  Left heart catheterization, coronary angiography, left  ventriculography.   OPERATOR:  Armanda Magic, M.D.   INDICATIONS:  Chest pain and history of CAD.   COMPLICATIONS:  None.   IV ACCESS:  Via right femoral artery 6-French sheath.   PROCEDURE:  This is a 60 year old white male with a history of coronary  disease status post PTCA stenting of the RCA in January 2004 who now  presents with recurrent chest pain of several months' duration primarily  exertional with shortness of breath, he was noted to have new onset diabetes  mellitus with a blood sugar of 1000 on admission and ruled out for  myocardial infarction, he now presents for cardiac catheterization.   The patient is brought to the cardiac catheterization laboratory in the  fasting nonsedated state. Informed consent was obtained. The patient was  connected to continuous heart rate and pulse oximetry monitoring and  intermittent blood pressure monitoring. The right groin was prepped and  draped in a sterile fashion. One percent Xylocaine was used for local  anesthesia. Using the modified Seldinger technique a 6-French sheath was  attempted to be placed in the right femoral artery but could not gain access  and the left side was prepped and draped and anesthetized with 1% lidocaine  and a 6-French sheath was inserted using the modified Seldinger technique  into the left femoral artery. Under fluoroscopic guidance a 6-French JL-4  catheter was placed in left coronary artery. Multiple cinefilms were taken  at 30 degree RAO,  40 degree LAO views. This catheter was then exchanged out  over guidewire for a 6-French JR-4 catheter which was placed under  fluoroscopic guidance in the right coronary artery. Multiple cinefilms were  taken at 30 degree RAO and 40 degree LAO views. This catheter was then  exchanged out over guidewire for a 6-French angled pigtail catheter which  was placed under fluoroscopic guidance in the left ventricular cavity. Left  ventriculography was performed in a 30 degree RAO view using a total of 30  cc of contrast at 15 cc per second. The catheter was then pulled back across  the aortic valve with no significant gradient noted. At the end of procedure  all catheters and sheaths were removed. Manual compression was performed  until adequate hemostasis was obtained. The patient was transferred back to  room in stable condition.   RESULTS:  The left main coronary artery is widely patent and bifurcates into  a left anterior descending artery and left circumflex artery. The left  anterior descending artery is diffusely diseased up to 40-50% throughout its  course. It gives rise to one diagonal branch which is widely patent. There  is a ramus branch that is widely patent and a left circumflex traverses the  AV groove with a 40% mid narrowing. The ongoing left circumflex is widely  patent.   The right coronary artery has luminal irregularities in the proximal and mid  portion up to 40%.  It then bifurcates distally in a posterior descending  artery and posterolateral artery. There is some evidence of mild restenosis  of the stent in the RCA.   Left ventriculography shows normal LV systolic function EF 55%, aortic  pressure 125/68 mmHg, LV pressure 128/16 mmHg.   ASSESSMENT:  1.  Nonobstructive coronary disease.  2.  Normal left ventricular function.  3.  New onset diabetes mellitus.  4.  Ongoing tobacco abuse.  5.  Dyslipidemia - a fasting lipid panel is pending.   PLAN:  Intravenous  fluid and bed rest for 6 hours, continue aspirin, tobacco  cessation currently on nicotine patch, aggressive control of blood pressure  and diabetes.       TT/MEDQ  D:  10/09/2004  T:  10/09/2004  Job:  [id number]   cc:   Dellis Anes. Idell Pickles, M.D.  [address]  [place]  [place] [address]  Fax: [phone]

## 2010-11-08 NOTE — Discharge Summary (Signed)
   NAME:  Jason [name], Jason [name]                       ACCOUNT NO.:  [id number]   MEDICAL RECORD NO.:  [id number]                   PATIENT TYPE:  IPS   LOCATION:  0500                                 [facility]   PHYSICIAN:  Jeanice Lim, M.D.              DATE OF BIRTH:  Feb 10, 1951   DATE OF ADMISSION:  12/09/2002  DATE OF DISCHARGE:  12/13/2002                                 DISCHARGE SUMMARY   This is a Dr. Dub Mikes dictation.                                               Jeanice Lim, M.D.    JEM/MEDQ  D:  01/04/2003  T:  01/05/2003  Job:  [id number]

## 2010-11-08 NOTE — Discharge Summary (Signed)
NAME:  Jason [name], Jason [name]                       ACCOUNT NO.:  [id number]   MEDICAL RECORD NO.:  [id number]                   PATIENT TYPE:  INP   LOCATION:  6525                                 FACILITY:  [facility]   PHYSICIAN:  Francisca December, M.D.               DATE OF BIRTH:  1951/01/29   DATE OF ADMISSION:  07/14/2002  DATE OF DISCHARGE:  07/16/2002                                 DISCHARGE SUMMARY   PRIMARY CARE Arriel Victor:  Dellis Anes. Idell Pickles, M.D.   PRIMARY CARDIOLOGIST:  Armanda Magic, M.D.   PROCEDURES:  (July 14, 2002) percutaneous transluminal coronary  angiography/stent x 2 (drug eluding stent, Cypher) to mid right coronary  artery.   DISCHARGE DIAGNOSES:  1. Coronary atherosclerotic heart disease.     a. Non-ST myocardial infarction with peak CK of 325, MB fraction of 52.8,        and troponin I of 3.69.     b. (July 15, 2002) coronary angiography by Meade Maw, M.D.,        revealing single-vessel coronary artery disease; 100% right coronary        artery.  There was some minimal distal collateral flow.  Ejection        fraction 45-50%.  Inferior akinesis.  Small diagonal 1 with luminal        irregularities up to 40%.  A large second obtuse marginal had luminal        irregularities up to 40%.     c. (July 15, 2002) percutaneous transluminal coronary angiography/drug        eluding stent implantation (x 2) to mid right coronary artery by Francisca December, M.D.  2. Ischemic left ventricular dysfunction; inferior akinesis with ejection     fraction 45-50% at the time of heart catheterization.  3. Dyslipidemia.  Statin profile revealed cholesterol 229, triglycerides     340, HDL 45, and LDL 116.  Liver function tests were checked and were     within good range.  Please start Zocor 40 mg p.o. q.h.s.  Will come in     for a fasting statin profile in approximately six weeks.  4. Tobacco abuse.  The patient was counseled on the risks/benefits of     smoking  cessation in view of his coronary artery disease.  He was     strongly urged to discontinue smoking.   DISPOSITION:  The patient was discharged home in stable condition.   DISCHARGE MEDICATIONS:  1. (New) Toprol XL 25 mg p.o. daily.  2. (New) Plavix 75 mg one p.o. daily for six months, take with food.  3. (New) nitroglycerin tablet 0.4 mg one sublingual p.r.n. chest pain up to     two tablets in 15 minutes.  4. (New) Zocor 40 mg q.h.s.  5. (New) enteric-coated aspirin 325 mg once daily.  6. Lexapro 20 mg p.o. daily.  ACTIVITY:  No heavy lifting, pushing, or driving the day of discharge or  tomorrow.  Activity as outlined per rehabilitation nurse.   DIET:  Low fat, low cholesterol.   SPECIAL INSTRUCTIONS:  Stop smoking.  Anticipate enrollment in cardiac  rehabilitation as an outpatient if possible by insurance.   FOLLOW-UP:  Armanda Magic, M.D.  Our office will call the patient with that  follow-up office visit in about two weeks.  He will also be scheduled in  approximately six weeks for a fasting statin profile.   HISTORY OF PRESENT ILLNESS:  The patient is a pleasant 60 year old gentleman  who presented with episodic chest pain which was progressive over the last  few weeks prior to admission.  On June 17, 2002, he had sudden onset of  left shoulder pain and fatigue which eventually resolved on its own.  He was  __________ at the time.  On the Thursday prior to admission, he was outside  working and felt chest burning severely.  It eventually wore off.  The next  day, sudden onset of chest pain with left arm pain for approximately 20  minutes.  It was sharp.  He had associated severe shortness of breath.  He  had recurrent episode relieved with four ibuprofen and Mylanta.  The chest  remained tight, but finally eased off.  The night before admission while  walking upstairs carrying a suitcase, he developed the exact same pain, this  time last two to three hours.  No  improvement with Mylanta.  Subsequently  increasing episodes of chest pain with any exertion.   PAST MEDICAL HISTORY:  Anxiety.   HOSPITAL COURSE:  The patient was admitted to the hospital where he did rule  out for a non-Q-wave myocardial infarction.  He was taken to the  catheterization lab on July 15, 2002, with the results as detailed above.  At the time of admission, he had been started on beta blocker, aspirin, and  IV nitrates.  Dr. Myriam Jacobson Preston's heart catheterization revealed single-  vessel CAD with 100% RCA.  There was some minimal distal collateral flow.  Plans were to initiate ACE inhibitor with the blood pressure improved.  He  was hypotensive with a blood pressure of 85/59.  The next day, he underwent  PTCA/stent with two Cypher stents to the mid RCA with reduction of stenosis  from 100% to 10%.   The subsequent hospital admission was uneventful, ambulating up and about  with cardiac rehabilitation without problems.  He was counseled about the  risks and dangers of continued tobacco abuse and he verbalized his  understanding of this.   LABORATORY TESTS AND DATA:  WBC 17.0 and at the time of discharge 10.8.  Hemoglobin 14.4; 12.9 at the time of discharge.  Hematocrit 41.5.  Platelets  229.  Sodium 136, K 3.7, chloride 103, CO2 25, glucose 115, BUN 7,  creatinine 1.0, calcium 8.9, total protein 5.9, albumin 3.1.  LFTs within  normal range.  Magnesium 1.9.  CK 3.6, MB fraction 52.8, troponin I 2.03.  Second CK 32, MB fraction 46.9, troponin I 3.69.  Cholesterol 229,  triglycerides 340, HDL 45, LDL 116.  An EKG done in the physician's office  showed NSR without acute ischemic changes.   The total time preparing this discharge was greater than 40 minutes,  including the discharge summary, filling out and reviewing the pink sheet  with the patient, and filling and calling in prescriptions to the patient's  pharmacy.  Salomon Fick, N.P.                       Francisca December, M.D.    MES/MEDQ  D:  07/29/2002  T:  07/30/2002  Job:  [id number]

## 2010-11-08 NOTE — H&P (Signed)
NAME:  Jason [name], Jason [name]                       ACCOUNT NO.:  [id number]   MEDICAL RECORD NO.:  [id number]                   PATIENT TYPE:  IPS   LOCATION:  0500                                 [facility]   PHYSICIAN:  Jeanice Lim, M.D.              DATE OF BIRTH:  [birth year]   DATE OF ADMISSION:  12/09/2002  DATE OF DISCHARGE:  12/13/2002                         PSYCHIATRIC ADMISSION ASSESSMENT   IDENTIFYING INFORMATION:  This is a 60 year old white male who is divorced.  This is a voluntary admission.  Chief complaint, the patient states I feel  like I'm coming unglued.   HISTORY OF PRESENT ILLNESS:  This patient who goes by the name of [name] has  a history of anxiety and depression.  He presented to the mental health ACT  team complaining of being depressed and cited his primary stressors being  fear of having another heart attack.  He also reported that he has  additional stressors of hospital bills more than $40,000 that he owes.  Because at the time of assessment he was having chest pain, he was sent to  the emergency room and subsequently admitted to the medicine service for a  workup for his chest pain which was subsequently negative.  The patient was  subsequently discharged home and then presented again here complaining of  persistent depression.  The patient endorses symptoms over the past 2-4  weeks including frequent crying spells, feelings of hopelessness, and  helplessness.  He endorses occasional intermittent feeling of guarding and  feelings of paranoia that someone is following him, feeling the need to look  over his shoulder and check frequently.  He endorses suicidal thoughts  without any clear plan and also reports that sometimes his thoughts are  intrusive and compulsive and he feels that his thoughts are more controlling  him than he able to control his thoughts.  He does have a history of anxiety  and agoraphobia which was diagnosed in the distant  past, although he feels  that the agoraphobia is at least partially resolved.  He does not have those  same symptoms.  He has been bingeing on cocaine the last weekend and has  felt that his thoughts are disorganized.   PAST PSYCHIATRIC HISTORY:  This is the patient's first admission to [facility].  He does have a history of one previous  overdose, another prior admission to [facility] one time after an  episode of Xanax abuse, and he also has a prior admission to [facility] in [place] for treatment of anxiety and panic.   SOCIAL HISTORY:  The patient has a Surveyor, minerals.  He currently is living with his parents.  He is a Psychologist, occupational and currently works  with [employer].  His work has been somewhat irregular lately which has  contributed to his stressor.  He feels overwhelmed by his hospital bills  from having  a myocardial infarction in January 2004.  No legal charges.   FAMILY HISTORY:  Remarkable for a maternal grandfather with depression and a  history of a mother with depression.   ALCOHOL AND DRUG HISTORY:  The patient has a history of cocaine abuse for  the past 4 years and has been bingeing recently after several months in a  row of sobriety.   PAST MEDICAL HISTORY:  The patient is followed by Dr. Rosamaria Lints, M.D., at  [facility] in [facility].  Medical problems are status post  myocardial infarction in January 2004 and coronary artery disease.  Past  medical history:  The patient had 2 stent placement in his coronary arteries  in January of 2004 and most recently was admitted to the medicine unit for  workup of chest pain with history of MI.   MEDICATIONS:  1. Plavix 75 mg for daily.  2. Zocor, dose is unclear.  3. Toprol 25 mg p.o. daily.  4. Altace 5 mg p.o. daily.  5. Lexapro 20 mg p.o. daily.  6. Nitroglycerine 0.4 mg sublingual p.r.n. chest pain.  7. Klonopin, the patient's dose is unclear and he  last took it on Monday,     felt that he has not needed it.  8. Aspirin 325 mg daily.   DRUG ALLERGIES:  SULFA DRUGS make him feel sick.   POSITIVE PHYSICAL FINDINGS:  The patient's full physical examination is  generally unremarkable and was done on the internal medicine service where  he had a negative cardiac workup.  Today, this is a well-nourished, well-  developed male who is in no acute distress.  He has facial symmetry in  place.  Grip strength equal bilaterally.  Motor movements are smooth.  Sensory is grossly intact.  Vital signs are within normal limits.  Romberg  is without findings, no tremor, no psychomotor slowing is noted.  Generally  nonfocal neuro exam.   MENTAL STATUS EXAM:  The patient is fully alert with a slightly perplexed  affect.  He is mildly anxious.  He is relaxed and cooperative.  Speech has  some latency but is normal in pace and tone.  Mood is depressed, mildly  anxious.  Thought process is remarkable for some thought blocking and some  disconnection of his thoughts, no overt homicidal ideation, no auditory  hallucinations or evidence of paranoia today, although he does describe some  clear paranoid feelings intermittently over the last week or 2.  He is  positive for suicidal ideation with no clear plan at this point.  He has  entertained over the past week some thoughts intermittently of overdosing  and some thoughts of wishing that he just would not wake up and could not  face the day or face getting any more bills.  Cognitively he is intact and  oriented x3.  Intelligence is average.  Insight fair.  Impulse control and  judgment within normal limits.   ADMISSION DIAGNOSIS:   AXIS I:  1. Major depressive disorder, recurrent, severe, rule out psychosis.  2. Anxiety disorder not otherwise specified.  3. Cocaine abuse.   AXIS II:  Deferred.  AXIS III:  Coronary artery disease status post Myocardial infarction.   AXIS IV:  Severe, financial  problems and some domestic frustrations,  currently living with his parents and preferring to live on his own.   AXIS V:  Current 34, past year 56.   INITIAL PLAN OF CARE:  Voluntarily admit the patient with q.15 minute checks  in place.  Our goal is to alleviate the suicidal ideation and to improve his  sleep/wake pattern.  We are going to check a CT scan of his brain without  contrast media since he has not had any imaging done and has had some  agitative thought and feelings of paranoia and has displayed some thought  blocking, to rule out space occupying lesion or acute changes.  We are going  to start  him on Risperdal 0.25 mg p.o. q.h.s. and continue his Lexapro at 20 mg and  we will restart his Klonopin at 0.5 mg p.o. b.i.d. and continue with his  routine medications.   ESTIMATED LENGTH OF STAY:  5 days.      [name] A. Stephannie Peters                   Jeanice Lim, M.D.    MAS/MEDQ  D:  01/11/2003  T:  01/11/2003  Job:  [id number]

## 2010-11-08 NOTE — H&P (Signed)
NAME:  TORENCE, Jason [name]                       ACCOUNT NO.:  [id number]   MEDICAL RECORD NO.:  [id number]                   PATIENT TYPE:  INP   LOCATION:  2019                                 FACILITY:  [facility]   PHYSICIAN:  Armanda Magic, M.D.                  DATE OF BIRTH:  1950-10-18   DATE OF ADMISSION:  07/14/2002  DATE OF DISCHARGE:                                HISTORY & PHYSICAL   CHIEF COMPLAINT:  Chest pain.   HISTORY OF PRESENT ILLNESS:  This is a 60 year old white male with no  previous cardiac history who presents with episodic chest pain that has been  worsening over the past couple of weeks.  Apparently back in December he had  the flu and had chest congestion.  On 26 December, he all of a sudden while  welding had severe onset of left shoulder pain and fatigue which eventually  resolved on its own.  Last Thursday he was outside working and all of a  sudden felt his chest burning severely.  It eventually, when he came inside,  wore off.  On Friday at lunch, he all of a sudden had severe onset of chest  pain and left arm pain with duration of approximately 20 minutes.  It was  sharp pain with severe shortness of breath.  No nausea or vomiting.  Chest  pain resolved but then recurred later that day.  He took four ibuprofen and  Mylanta.  His chest still remained tight but finally eased off, and he went  home.  Later that night while walking up the stairs carrying a suitcase, he  developed the exact same chest pain again, this time lasting two to three  hours.  He took Mylanta without any improvement whatsoever.  Since then, he  has been having increasing episodes of chest pain with any exertion.  He  denies nausea, vomiting, or diaphoresis with the episodes but does have  significant shortness of breath.   PAST MEDICAL HISTORY:  Significant for anxiety.  He has a history a couple  of years ago of chest pain with anxiety, but he states this is a completely  different  chest pain and much more severe than what he had before.   ALLERGIES:  SULFA.   PAST SURGICAL HISTORY:  None.   MEDICATIONS:  Lexapro 20 mg a day and ibuprofen p.r.n. for shoulder pain.   SOCIAL HISTORY:  He is not married.  He has one child in good health.  He  smoked one and one-half packs of cigarettes for 30 years.  He drinks three  to four cups of tea or soft drinks a day.  He occasionally uses cocaine once  every month or so but has not done any in several years.   REVIEW OF SYSTEMS:  As stated in HPI.  Occasional headaches, cough, painful  joints, backache, and problems with worrying.   PHYSICAL EXAMINATION:  VITAL SIGNS:  Blood pressure 140/90, heart rate 80,  weight 180 pounds.  GENERAL:  Well developed, well nourished male in no acute distress.  HEENT:  Benign.  NECK:  Supple without lymphadenopathy.  Carotid upstrokes +2 bilaterally, no  bruits.  LUNGS:  Clear to auscultation throughout.  HEART:  Regular rate and rhythm.  No murmurs, rubs, or gallops.  Normal S1  and S2.  ABDOMEN:  Soft, nontender, nondistended with active bowel sounds.  No  hepatosplenomegaly.  EXTREMITIES:  No cyanosis, erythema, or edema.  Good distal pulses.   LABORATORY DATA:  EKG done in physician's office showed normal sinus rhythm,  no acute changes.   ASSESSMENT:  Chest pain, somewhat atypical in that it is sharp but very  concerning because it occurs every time he exerts himself and is associated  with severe shortness of breath.  He has multiple cardiac risk factors  including age, family history of fairly early age for coronary disease, as  well as a significant tobacco history.   PLAN:  Admit to telemetry unit today.  Rule out myocardial infarction with  serial enzymes, IV heparin drip per pharmacy protocol, aspirin, Toprol XL 25  mg daily, nitroglycerin drip at 10 mcg per minute.  Check fasting lipid  panel and TSH.  Will plan heart catheterization if the schedule permits by  Dr.  Fraser Din on 07/15/2002.                                               Armanda Magic, M.D.    TT/MEDQ  D:  07/14/2002  T:  07/14/2002  Job:  [id number]

## 2010-11-08 NOTE — Discharge Summary (Signed)
[name], [name]             ACCOUNT NO.:  [id number]   MEDICAL RECORD NO.:  [id number]          PATIENT TYPE:  INP   LOCATION:  2004                         FACILITY:  [facility]   PHYSICIAN:  Sherin Quarry, MD      DATE OF BIRTH:  1950/12/13   DATE OF ADMISSION:  10/07/2004  DATE OF DISCHARGE:  10/12/2004                                 DISCHARGE SUMMARY   HISTORY OF PRESENT ILLNESS:  Jason [name] is a 60 year old man with a  previous history of coronary artery disease status post angioplasty status  post MI x2.  Mr.  Beaumier also has an ongoing history of depression and  cigarette smoking.  He initially presented on April 17 to the [facility]  Emergency Room with a five day history of intermittent chest pain both at  rest and with exertion associated with shortness of breath and diaphoresis.  A particularly concerning episode occurred when the patient mowed his  mother's yard for her and experienced chest pressure, fatigue and weakness  after he had completed this task.  At that time, he took one nitroglycerin  without any obvious improvement.  In retrospect, he also says that for  several weeks prior to presentation he had been experiencing visual  blurring, excessive thirst and urinary frequency.  Upon arrival to the  emergency room, he was noted to have a blood sugar of 993.  His creatinine  was 1.8, BUN 32, CO2 18.  Thus, the patient was admitted to the hospital for  two reasons.  First, to evaluate recurrent chest pain.  Second, to provide  treatment for new onset of diabetes.   PHYSICAL EXAMINATION:  At the time of admission as described by Dr.  __________.  VITAL SIGNS:  Blood pressure 150/87, pulse 91, respirations 13, temperature  97, O2 saturation 90%.  HEENT:  Within normal limits.  CHEST:  Clear.  CARDIOVASCULAR:  Normal S1, S2.  No murmurs, rubs or gallops.  ABDOMEN:  Benign.  Normal bowel sounds without masses, tenderness or  organomegaly.  NEUROLOGICAL:   Within normal limits.  EXTREMITIES:  Within normal limits.   STUDIES:  Chest x-ray was obtained which was negative showing a normal  cardiac size with no acute infiltrates.  EKG showed small Q waves in leads  2, 3, and AVF.  White count was 10,800, hemoglobin 15.7, glucose as  mentioned above.  Sodium 117.  On admission, the patient was administered 3  liters of normal saline rapidly, and then was placed in IV with normal  saline at 150 cc per hour.  He was begun on the glucomander protocol and  serial cardiac enzymes were obtained.  He was placed on Lovenox protocol and  was administered aspirin 325 mg daily.  Lopressor 12.5 mg b.i.d. was  initiated.  Electrolytes were very carefully monitored, and when the  potassium level came down with insulin therapy, the patient was given  several runs of potassium to correct this abnormality.  By April 19, the  patient's blood sugar was much better regulated.  He was taken off the  glucomander and was placed on Lantus  insulin 30 units daily along with the  sliding scale.  Consultation was obtained from Dr. Armanda Magic of the  Cardiology Service who recommended the patient undergo cardiac  catheterization to evaluate his chest pain.  On April 19, the patient did  undergo cardiac catheterization.  The findings basically were nonobstructive  coronary artery disease.  The patient had a 40% lesion in the right coronary  and another 40% lesion described in the left coronary system.  The final  report on the cardiac catheterization is pending.  Dr. Mayford Knife recommended  medical therapy for this patient and did not think that angioplasty was  indicated.  During the course of the hospitalization, the patient was  instructed in insulin administration and in measuring his blood sugars.  He  expressed understanding of how to do these procedures.  His A1C hemoglobin  was subsequently found to be 12.2.  Lipid profile had several worrisome  findings.  Total  cholesterol was 238, triglyceride 1202, HDL 15.  Because of  the high triglycerides, the LDL could not be determined.  By April 22, the  patient appeared to be ready for discharge.  At the time of discharge, I  explained to the patient that from this point on, it was essential that he  have regular visits with a physician, and that his diabetes treatment would  need to be adjusted over time to achieve adequate control.  I emphasized to  him the importance of getting the A1C hemoglobin down to acceptable range.   DISCHARGE DIAGNOSES:  1.  New onset diabetes with mild ketoacidosis.  2.  Chest pain with nonobstructive coronary artery disease.  3.  Hyponatremia secondary to hyperglycemia.  4.  Dehydration.  5.  Mild renal dysfunction, resolved.  Creatinine at time of discharge was      0.9.  6.  Hyperlipidemia.   DISCHARGE MEDICATIONS:  The patient will be advised to continue:  1.  Aspirin 325 mg daily.  2.  Lopressor 12.5 mg b.i.d.  3.  Potassium chloride 20 mEq daily.  4.  Lisinopril 20 mg daily.  5.  Hydrochlorothiazide 12.5 mg daily.  6.  Lantus insulin 45 units daily.  7.  Amaryl 4 mg b.i.d.  8.  Zocor 20 mg daily.  9.  Lexapro 20 mg daily.   DISCHARGE INSTRUCTIONS:  The patient will be instructed to measure his CBG's  a.c. and h.s.  He was instructed to contact Dr. Francis Dowse Heller's office on  Monday to report his blood sugars and for possible adjustment of his insulin  therapy at that time.  The patient was further advised of the need to have  an appointment with Dr. Idell Pickles next week to adjust his medications and to  discuss how he was doing with his insulin treatment.  An appointment will be  scheduled with the nurse practitioner at [facility] in two weeks and  with Dr. Armanda Magic in three months.   CONDITION ON DISCHARGE:  Fair.      SY/MEDQ  D:  10/12/2004  T:  10/13/2004  Job:  [id number]  cc:   Dellis Anes. Idell Pickles, M.D.  [address]  [place]  [place] [address]   Fax: [phone]   Armanda Magic, M.D.  [address]  [place], [place] [address]  Fax: [phone]

## 2010-11-08 NOTE — Op Note (Signed)
NAME:  Jason [name], Jason [name]                       ACCOUNT NO.:  [id number]   MEDICAL RECORD NO.:  [id number]                   PATIENT TYPE:  INP   LOCATION:  3308                                 FACILITY:  [facility]   PHYSICIAN:  Balinda Quails, M.D.                 DATE OF BIRTH:  [birth year]   DATE OF PROCEDURE:  09/01/2002  DATE OF DISCHARGE:                                 OPERATIVE REPORT   SURGEON:  Balinda Quails, M.D.   ASSISTANT:  Maple Mirza, P.A.   ANESTHESIA:  General with LMA.  Anesthesiologist is Maren Beach, M.D.   PREOPERATIVE DIAGNOSIS:  1. Ischemic right lower extremity.  2. Thrombosis right common femoral artery.   POSTOPERATIVE DIAGNOSIS:  1. Ischemic right lower extremity.  2. Thrombosis right common femoral artery.   PROCEDURE:  1. Right common femoral thrombectomy.  2. Right common femoral endarterectomy.  3. Resection of right common femoral artery with 8 mm Gore-Tex interposition     graft.   INDICATIONS FOR PROCEDURE:  This is a 60 year old male with a history of  heavy tobacco abuse and recent acute myocardial event requiring percutaneous  intervention.  He represented to Dr. Katrinka Blazing with chest pain and underwent  repeat cardiac catheterization via the right common femoral route two days  ago. Following this, he developed claudication type symptoms in his right  lower extremity. He represented to the hospital yesterday and investigation  revealed evidence of a right common femoral thrombosis.   On evaluation, his motor and sensory function were intact. He complained of  some mild paresthesia in his foot.  He is brought to the operating room at  this time for exploration.   DESCRIPTION OF PROCEDURE:  The patient is brought to the operating room in  stable condition.  Placed in the supine position.   The right leg is prepped and draped in the usual sterile fashion. General  anesthesia induced with LMA.   A longitudinal skin incision made  through the right groin.  Subcutaneous  tissue divided with electrocautery.  Lymphatics divided.  The right common  femoral artery exposed at the inguinal ligament and encircled with a vessel  loop proximally. The right common femoral artery was exposed throughout its  length. It was a long common femoral artery.  There was a strong pulse  proximally and this was absent in the distal right common femoral artery.  The profunda and superficial femoral arteries were encircled with vessel  loops. The patient was administered 5000 units of heparin intravenously.  The femoral vessels were controlled with clamps. A longitudinal arteriotomy  made in the distal common femoral artery.  The arteriotomy extended  proximally up into the junction of the external iliac.  There was thrombus  present in the midportion of the right common femoral artery.  A subintimal  dissection and plaque were present. A large amount of plaque was present in  the common femoral artery. The plaque extended down to the origin of the  profunda and superficial femoral arteries.  A 5 Fogarty catheter was passed  down the profunda and superficial femoral arteries and there was no evidence  of distal embolization.   An endarterectomy of the right common femoral artery was carried out. At  completion of this, evaluation of the artery revealed a tear in the  posterior wall where the Perclose stitch had been placed obliquely across  the medial and posterior walls of the artery. This was not felt to be  salvageable and reparable.  Therefore, the common femoral artery was  resected up to the external iliac artery and down to the origin of the  profunda and superficial femoral arteries.   An interposition graft of 8 mm Gore-Tex was placed in the right groin to  repair the artery.  This was end-to-end anastomosed to the external iliac  artery.  Then beveled and anastomosed to the origin of the superficial  femoral artery and profunda  femoris arteries.  Anastomosis carried out with  6-0 Prolene suture in the running fashion.   At completion of the anastomosis, graft was flushed. Clamps were then  removed.   The palpable right posterior tibial pulse was present. Adequate hemostasis  obtained. The patient administered 50 mg of Protamine intravenously.  Needle, sponge, and instrument count correct.   Subcutaneous tissue closed with a deep layer of running 2-0 Vicryl suture,  subcutaneous layer of running 2-0 Vicryl suture, skin closed with 4-0  Monocryl, half inch Steri-Strips applied.   The patient tolerated the procedure well and was transferred to the recovery  room in stable condition.                                               Balinda Quails, M.D.    PGH/MEDQ  D:  09/01/2002  T:  09/01/2002  Job:  [id number]   cc:   Lesleigh Noe, M.D.  [address]  [place]  [place] [address]  Fax: [phone]

## 2010-11-08 NOTE — Discharge Summary (Signed)
NAME:  Jason [name], Jason [name]                       ACCOUNT NO.:  [id number]   MEDICAL RECORD NO.:  [id number]                   PATIENT TYPE:  IPS   LOCATION:  0500                                 FACILITY:  [facility]   PHYSICIAN:  Geoffery Lyons, M.D.                   DATE OF BIRTH:  23-Aug-1950   DATE OF ADMISSION:  12/09/2002  DATE OF DISCHARGE:  12/13/2002                                 DISCHARGE SUMMARY   CHIEF COMPLAINT AND PRESENT ILLNESS:  This was the first admission to [facility] for this 60 year old white divorced male voluntarily  admitted.  I feel like I'm coming unglued.  History of anxiety and  depression.  Presented to the emergency room feeling like having another  heart attack and depressed secondary to hospital bills, close to $40,000.  Binging on cocaine.  Feeling hopeless and helpless.  Feelings of paranoia.  Endorsed suicidal thoughts without a plan.   PAST PSYCHIATRIC HISTORY:  First time at [facility].  History of  previous overdose.  [facility] once.  Xanax abuse.   ALCOHOL/DRUG HISTORY:  As already stated.  History of cocaine binging for  four years.  Also past history of Xanax abuse.   PAST MEDICAL HISTORY:  Status post myocardial infarction, coronary artery  disease.   MEDICATIONS:  Plavix 25 mg daily, Zocor, Toprol 25 mg daily, Altace 5 mg  daily, Lexapro 20 mg daily, nitroglycerin, Klonopin p.r.n.   PHYSICAL EXAMINATION:  Performed and failed to show any acute findings.   MENTAL STATUS EXAM:  Fully alert, slightly perplexed affect, male.  Mildly  anxious but cooperative with the interview.  Speech with some latency but  normal tempo and production.  Mood depressed, mildly anxious.  Affect  depressed and anxious.  Thought processes positive for some disconnected  thoughts.  Not as spontaneous.  Some guarded.  No suicidal ideation.  No  homicidal ideation.  Cognition well-preserved.   ADMISSION DIAGNOSES:   AXIS I:  1.  Cocaine abuse; rule out dependence.  2. Major depression, recurrent.  3. Anxiety not otherwise specified.   AXIS II:  No diagnosis.   AXIS III:  1. Status post myocardial infarction.  2. Coronary artery disease.   AXIS IV:  Moderate.   AXIS V:  Global Assessment of Functioning upon admission 30; highest Global  Assessment of Functioning in the last year 68.   HOSPITAL COURSE:  She was admitted and started intensive individual and  group psychotherapy.  He was placed on the Plavix 75 mg per day, Toprol 25  mg daily, Altace 5 mg daily.  He was given Librium 25 mg every six hours as  needed for withdrawal and Lexapro 20 mg per day.  He was given Risperdal  0.25 mg at night and he was given some Klonopin 0.5 mg.  He endorsed,  though, increased anxiety and suicidal ruminations by the second  day of the  admission.  Started sleeping okay.  Did endorse the worrying, the agitation.  He started looking at the prior relapses.  He felt he had nowhere to go.  Was feeling hopeless, helpless.  Relapsed on cocaine.  Understood the effect  of cocaine on his heart and still continued using it.  As the  hospitalization progressed and he opened up, he settled down.  Mood started  to improved and his affect became brighter.  More insight in terms of the  consequences of the cocaine.  CT scan was performed that failed to show any  acute findings.  On December 13, 2002, he was in full contact with reality.  There were no suicidal ideation, no homicidal ideation.  He was basically  detoxed, willing and motivated to pursue further outpatient treatment, so  discharge was granted.   DISCHARGE DIAGNOSES:   AXIS I:  1. Cocaine dependence.  2. Depressive disorder not otherwise specified.  3. Anxiety disorder not otherwise specified.   AXIS II:  No diagnosis.   AXIS III:  1. Coronary artery disease.  2. Status post myocardial infarction.   AXIS IV:  Moderate.   AXIS V:  Global Assessment of Functioning  upon discharge 50.   DISCHARGE MEDICATIONS:  1. Plavix 75 mg daily.  2. Toprol-XL 25 mg daily.  3. Altace 5 mg daily.  4. Lexapro 20 mg daily.  5. Aspirin 325 mg daily.  6. Klonopin 0.5 mg twice a day.  7. Risperdal 0.5 mg at night.  8. Nitroglycerin.  9. Trazodone 100 mg for sleep.   FOLLOW UP:  [facility].                                               Geoffery Lyons, M.D.    IL/MEDQ  D:  01/11/2003  T:  01/12/2003  Job:  [id number]

## 2010-11-08 NOTE — H&P (Signed)
NAME:  Jason [name], Jason [name]                       ACCOUNT NO.:  [id number]   MEDICAL RECORD NO.:  [id number]                   PATIENT TYPE:  INP   LOCATION:  0344                                 FACILITY:  [facility]   PHYSICIAN:  Melissa L. Ladona Ridgel, MD               DATE OF BIRTH:  [birth year]   DATE OF ADMISSION:  08/21/2003  DATE OF DISCHARGE:                                HISTORY & PHYSICAL   PRIMARY CARE PHYSICIAN:  Dellis Anes. Heller, M.D.   CHIEF COMPLAINT:  Drug overdose.   HISTORY OF PRESENT ILLNESS:  The patient is a 60 year old white male who was  discharged from [facility] two weeks ago where he was being treated for  depression. His antidepressant medications were changed to Zoloft. The  patient states that he noticed his symptoms of anxiety and panic as well as  depression has not been well controlled on Zoloft. He relates that he has  been hearing voices criticizing him for not being able to work, etc., etc.  This is the first time that he has told anybody that he hears voices. Some  of the voices he can identify, others he cannot. He denies homicidal  ideation and at present denies suicidal ideation. He states that just really  wanted his anxiety and panic attacks to go away. He thought that if he took  the extra medication that the symptoms would stop.   PAST MEDICAL HISTORY:  1. Myocardial infarction times two, treated by Dr. Armanda Magic.  2. Panic, anxiety, and depression, being treated by Dr. Idell Pickles.  3. DVT on the right side in the past with a synthetic graft placed to his     catheterization.   PAST SURGICAL HISTORY:  Catheterization times one with a synthetic graft in  the right groin.   ALLERGIES:  SULFA.   MEDICATIONS:  1. Plavix 75 mg daily.  2. Zocor 40 mg daily.  3. Toprol XL two daily.  4. Aspirin 325 mg daily.  5. Zoloft unknown dose daily.   SOCIAL HISTORY:  Smokes one pack a day. He denies any ethanol. He does have  a history of cocaine use. He  says his last use was back in December.   FAMILY HISTORY:  His mother is living with osteoporosis. His father is  living with MI in his 6s.   REVIEW OF SYSTEMS:  He did have some nausea and vomiting earlier today;  vomited times four after he took all the pills. He denied diarrhea. It is  noted that he has had increased sleep behavior. Further review of systems,  he states he does have a headache. He is having auditory hallucinations and  as reported earlier nauseated with vomiting. All others are negative.   PHYSICAL EXAMINATION:  VITAL SIGNS: Temperature 96.7, blood pressure 115/70,  pulse 57, respiratory rate 16, and on room air 99%.  GENERAL: He is a well-developed, well-nourished, white male in no  acute  distress. He is calm and pleasant.  HEENT: Normocephalic and atraumatic. Pupils equal, round, and reactive to  light. Extraocular muscles intact. Mucous membranes moist. There is mild  posterior pharyngeal  erythema.  NECK: Supple. There is no JVD, no lymphadenopathy, no thyromegaly.  CHEST: Clear to auscultation.  CARDIOVASCULAR: Regular rate and rhythm. Positive S1 and S2. Currently, he  is bradycardic with no S3 or S4. No murmurs, rubs, or gallops.  ABDOMEN: Soft, nontender with positive bowel sounds.  EXTREMITIES: There is no edema. There are 2+ pulses, radial and DP.  NEUROLOGIC: Cranial nerves II-XII are intact. Power is 5/5 in all  extremities. Sensation is grossly intact.   LABORATORY DATA:  Tylenol level is less than 10, salicylate less than 4,  ethanol less than 5. GBS is negative. He is somewhat hyponatremic with a  sodium of 126, BUN 10, creatinine 0.9, glucose 93, potassium 3.6.  Liver  enzymes are within normal limits. AST 15, ALT 31, alkaline phosphatase 1.9.   EKG shows sinus bradycardia with a no ST-T wave changes.   Currently his white count is 18.9 with a hemoglobin of 16, hematocrit 44.9,  and platelet count 298,000. There is no shift.   ASSESSMENT/PLAN:   This is a 60 year old white male with past medical history  of panic, anxiety, and depression who recently has had medication change. He  now admits to having auditory hallucinations. They are not command in  nature, but are critical of his behavior. He does have a past medical  history of suicide attempts. He presents after taking 40 to 50 Toprol and a  large dose of his Zoloft.  He vomited several times before coming into the  emergency room.  1. Psychiatric: Will hold his Zoloft and use p.r.n. Haldol for anxiety and     panic as he does complain of some auditory hallucinations, the Haldol     being more appropriate than Ativan. The patient has had a recent head CT     the end of 2004, so I do not feel it is necessary at this time to rule     out mass or lesion causing the auditory hallucinations.  There is no     evidence of cocaine use related to this episode. We will speak with     social workers to help arrange for voluntary commitment on his behalf to     further titrate his medications and update his diagnosis.  2. Cardiovascular: Will continue monitoring his blood pressure and pulse. He     has been stable, bradycardiac, since admission. Will hold his blood     pressure medication and continue his aspirin, Plavix, and Zocor, and     gently hydrate him.  3. Pulmonary: There are no issues.  4. GU: There is no dysuria.  5. GI: There is no further nausea and vomiting.  6. Will check a TSH.  7. In terms of his elevated white blood cell count, there does not appear to     be any infectious cause for this. It is possible that he has margination     of white cells related to the overdose and not the vomiting. We will     follow up on his labs in the morning.  Melissa L. Ladona Ridgel, MD    MLT/MEDQ  D:  08/23/2003  T:  08/23/2003  Job:  [id number]

## 2010-11-08 NOTE — Discharge Summary (Signed)
NAME:  Jason [name], Jason [name]                       ACCOUNT NO.:  [id number]   MEDICAL RECORD NO.:  [id number]                   PATIENT TYPE:  INP   LOCATION:  3308                                 FACILITY:  [facility]   PHYSICIAN:  Lyn Records, M.D.                DATE OF BIRTH:  05-15-1951   DATE OF ADMISSION:  08/31/2002  DATE OF DISCHARGE:  09/03/2002                                 DISCHARGE SUMMARY   ATTENDING PHYSICIAN:  Lyn Records, M.D.   DISCHARGE DIAGNOSES:  1. Recurrent angina with follow-up left heart catheterization 08/30/02 (he was     at [facility] 1/04 with myocardial infarction and stent     placement).  2. Ischemic right lower extremity after admission 08/31/02 for left heart     catheterization.   SECONDARY DIAGNOSES:  1. History of tobacco habituation.  2. Myocardial infarction, 1/04 with catheterization and stent placement.  3. Hyperlipidemia.  4. Strong family history of atherosclerotic coronary artery disease.   PROCEDURES:  1. 09/01/02, thrombectomy of right common femoral artery, right profunda     femoris artery, right superficial femoral artery, placement of     interposition Gore-Tex for repair of traumatized right common femoral     artery, Dr. Bud Face, surgeon.  2. 08/31/02 and 09/02/02, ankle brachial indexes.  On 08/31/02, the ankle     brachial index on the right was 0.27, on the left 0.93.  On 09/02/02,     after surgery, ankle brachial index on the right was 0.93 and on the left     0.92.   DISCHARGE DISPOSITION:  Mr. Jason [name] is going home on postoperative  day #3 after undergoing repair of traumatized right common femoral artery.  He has been afebrile in the postoperative period and his mental status has  been clear.  His right lower extremity is well-perfused with ankle brachial  index of 0.92 on the right.  His right foot is warm, he has no claudication  symptoms with ambulation.  Pain in the right groin where the surgery  was  done is controlled with oral analgesics, he has been afebrile.  He was  taking oral nourishment well and has full bowel function.  His incision is  healing nicely.   DISCHARGE MEDICATIONS:  1. Percocet 5/325 one to two tablets p.o. q.4-6h. p.r.n. pain.  2. Zocor 40 mg daily, preferably at bedtime.  3. Toprol XL 25 mg daily.  4. Plavix 75 mg daily.  5. Altace 5 mg daily.  6. Lexapro 20 mg daily.  7. Enteric-coated aspirin 325 mg daily.  8. Nitroglycerin tablets, 0.4 mg sublingual every five minutes as needed for     chest pain.   DISCHARGE ACTIVITY:  Walk daily as his strength permits, he is not to drive  until he sees Dr. Madilyn Fireman.   DISCHARGE DIET:  Low sodium, low cholesterol diet.   He may shower  daily.  He is to call Dr. Madilyn Fireman if the incision in the right  groin becomes inflamed or starts to drain.  He will see Dr. Madilyn Fireman Monday,  09/12/02, Dr. Madilyn Fireman' office will call to arrange that appointment.  He will  have ankle brachial indexes at that time.   BRIEF HISTORY:  Mr. Jason [name] is a 60 year old male who first  presented to [facility] with a myocardial infarction back in 1/04.  At that time, a stent was placed at the proximal to mid right groin coronary  artery.  At that time, his ejection fraction was 60%.  He once again  presented to [facility] Emergency Room on 08/30/02 with substernal  chest pain and diaphoresis when taking a walk, this pain was controlled  after he took sublingual nitroglycerin.  He was readmitted to [facility] on 08/30/02 and underwent left heart catheterization which showed  that his coronary anatomy was unchanged from the study done 07/21/02.  He was  discharged the same day, 08/30/02, but overnight felt increasing pain and  numbness in the right foot which would abate somewhat when he dangled it  over the side of the bed.  He had a very rough time sleeping that night with  pain in the right foot, right chest, and the right  hip.  On the morning of  08/31/02, he thought that maybe he could walk this off by doing a couple of  traverses of his driveway, however, the symptoms, which sound very much like  claudication, increased with walking.  He presented to [facility]  Emergency Room 08/31/02 and was readmitted by Dr. Katrinka Blazing.  He was seen in  consultation by Dr. Madilyn Fireman who ordered ankle brachial indexes and with a  diagnosis of ischemic right foot status post left heart catheterization.   HOSPITAL COURSE:  After admission on 08/31/02 to [facility] with  ischemic right lower extremity, he underwent ankle brachial indexes that  showed ABI of 0.27 on the right.  He was seen by Dr. Madilyn Fireman who recommended  surgical intervention.  This was done on 09/01/02 as dictated above.  The  patient tolerated the procedure well and had good flow to the right lower  extremity after the procedure and has continued to do well in the three days  postoperatively.  His right leg is well-perfused and there is no evidence of  numbness or pain as there was prior to his admission.  He goes home with the  medications and followup as dictated above.     Lesleigh Noe, M.D.                  Lyn Records, M.D.    HWS/MEDQ  D:  09/03/2002  T:  09/05/2002  Job:  [id number]   cc:   Balinda Quails, M.D.  [address]  [place] [address]  [place] [address]  Fax: [phone]   Armanda Magic, M.D.  [address]  [place], [place] [address]  Fax: [phone]   Lyn Records III, M.D.  [address]  [place]  [place] [address]  Fax: [phone]

## 2010-11-08 NOTE — Consult Note (Signed)
NAMEAMBROSE, Jason [name]             ACCOUNT NO.:  [id number]   MEDICAL RECORD NO.:  [id number]          PATIENT TYPE:  INP   LOCATION:  3305                         FACILITY:  [facility]   PHYSICIAN:  Armanda Magic, M.D.     DATE OF BIRTH:  1951-03-10   DATE OF CONSULTATION:  10/08/2004  DATE OF DISCHARGE:                                   CONSULTATION   PRIMARY CARE PHYSICIAN:  Dellis Anes. Heller, M.D.   CHIEF COMPLAINT:  Chest pain.   HISTORY OF PRESENT ILLNESS:  This is a very pleasant 60 year old white male  with a history of coronary disease status post PTCA stenting in the RCA in  January 2004.  He was lost to followup and has not followed up in our office  since.  In May 2004 he underwent repeat cardiac catheterization because of  chest pain and inferior ischemia on a Cardiolite study and the  catheterization demonstrated moderate LAD and left circumflex disease and a  patent right coronary artery stent.  The patient has been in his usual state  of health except for increased fatigue and mood swings and increased thirst  for several weeks.  Apparently back in December he said he started having  exertional chest pain and shortness of breath and was worried that something  may be wrong, but kind of decreased his exercise and exertional load to  prevent the symptoms.  Apparently last Thursday or Friday he went to work  but Saturday began mowing his lawn and developed excessive fatigue  associated with midsternal chest pressure with nausea, diaphoresis and  blurred vision.  He took a sublingual nitroglycerin without any significant  changes in his symptoms and then sat down and rested.  The symptoms  persisted.  He then vomited and felt slightly better but since the symptoms  persisted more than an hour he came to the emergency room where he was given  sublingual nitroglycerin and the chest pressure and fatigue resolved.  The  patient states the pressure at that time was an 8/10 and has had  similar  recurrences at rest and with activity since here but only 1-2/10 and only  lasting a few minutes at a time.  Since his admission he was noted to have  diabetes mellitus with an initial glucose of greater than 1000.   PAST MEDICAL HISTORY:  1.  Coronary disease, status post PTCA stenting with a cipher stent x2 in      the proximal and mid RCA in January 2004 with repeat catheterization in      March 2004 showing a patent RCA stent with 60 to 70% narrowing in the      diagonal LAD and 60 to 70% narrowing in the left circumflex.  2.  New onset diabetes mellitus.  3.  Hypovolemia secondary to hyperglycemia.  4.  Hyponatremia and renal insufficiency secondary to new onset diabetes      mellitus with hyperglycemia.  5.  Tobacco abuse, ongoing.  6.  Dyslipidemia.  7.  Anxiety and depression.   ALLERGIES:  SULFA.   MEDICATIONS:  1.  Aspirin 81 mg  a day.  2.  Zantac 75 mg p.r.n.   He has not been taking his medications as prescribed.  In the past, he has  been on Plavix, Altace, Lexapro, and Toprol.   INPATIENT MEDICATIONS:  1.  Lopressor 12.5 mg b.i.d.  2.  Aspirin 325 mg a day.  3.  Protonix.  4.  Lovenox subcutaneously q.12h.  5.  Insulin drip.  6.  Zocor.  7.  Lexapro.  8.  Ativan.   REVIEW OF SYSTEMS:  As stated in the HPI.  Increased fatigue, hunger,  polydipsia, blurred vision, also some nausea.   FAMILY HISTORY:  Significant for diabetes.   SOCIAL HISTORY:  He is a smoker of one pack per day for greater than 20  years.  He denies alcohol use.  He works as a Psychologist, occupational.   PHYSICAL EXAMINATION:  VITAL SIGNS:  Blood pressure is 118/62, pulse 65,  respirations 20, he is afebrile.  HEENT:  Benign.  NECK:  Supple, without lymphadenopathy, carotid upstrokes 2+ bilaterally and  no bruits.  LUNGS:  Clear to auscultation throughout.  HEART:  Regular rate and rhythm, no murmurs, rubs or gallops.  Normal S1/S2.  ABDOMEN:  Soft, nontender, non-distended, with active bowel  sounds, no  hepatosplenomegaly.  EXTREMITIES:  No edema.   EKG done on admission showed normal sinus rhythm with nonspecific T wave  abnormality, inferior Q waves were present with old previous inferior  infarct.   CPKs were 154, 190, 50, 58.  MB's were all less than 1.0 on initial cardiac  markers, 0.6 and 0.8.  Troponin was less than 0.05 x3 and less than 0.01 x2.  Sodium 132, potassium 3.8, chloride 105, bicarbonate 21, BUN 10, creatinine  0.9, glucose 162.  White cell count 10.8, hemoglobin 15.7, hematocrit 45,  platelet count 273,000.  BNP 31.   ASSESSMENT:  1.  Chest pain worrisome for underlying coronary artery ischemia given his      chest pain syndrome with shortness of breath with exertion since      December, which appears to have worsened recently, he now has newly      diagnosed diabetes mellitus as well as ongoing tobacco abuse and      noncompliance with medications.  I am very concerned with his symptoms      that he may have had progression of coronary disease.  2.  History of coronary disease, status post percutaneous transluminal      coronary angiography stenting of the right coronary artery with residual      disease in the left anterior descending, diagonal and circumflex      systems.  3.  Dyslipidemia.  4.  Tobacco abuse, ongoing.  5.  New onset diabetes mellitus.   PLAN:  1.  I have recommended proceeding with a cardiac catheterization to evaluate      for progression of coronary artery disease.  He understands the risks      and benefits of the catheterization which were explained to him and we      will try to proceed with cardiac catheterization on October 09, 2004 if      the schedule permits.  2.  Continue subcu Lovenox.  3.  Continue aspirin.  4.  I agree with continuing beta-blocker and Zocor.       TT/MEDQ  D:  10/08/2004  T:  10/08/2004  Job:  [id number]

## 2010-11-08 NOTE — Cardiovascular Report (Signed)
NAME:  Jason [name], Jason [name]                       ACCOUNT NO.:  [id number]   MEDICAL RECORD NO.:  [id number]                   PATIENT TYPE:  OIB   LOCATION:  2856                                 FACILITY:  [facility]   PHYSICIAN:  Lesleigh Noe, M.D.            DATE OF BIRTH:  April 25, 1951   DATE OF PROCEDURE:  08/30/2002  DATE OF DISCHARGE:  08/30/2002                              CARDIAC CATHETERIZATION   INDICATION FOR STUDY:  Recurrent chest discomfort, nowhere near as severe as  that noted prior to coronary intervention.  Abnormal Cardiolite study  demonstrating inferior ischemia.  Prior history of right coronary stenting,  July 15, 2002.   PROCEDURE PERFORMED:  1. Left heart catheterization.  2. Selective coronary angiography.  3. Left ventriculography.  4. Perclose arteriotomy closure.   DESCRIPTION OF PROCEDURE:  After informed consent, a 6-French sheath was  placed in the right femoral artery using the modified Seldinger technique.  A 6-French A2 multipurpose catheter was used for hemodynamic recordings,  left ventriculography, selective left and right coronary angiography.  Perclose arteriotomy closure was performed after sheathogram in the right  femoral demonstrated appropriate anatomy for this procedure.   RESULTS:  1. Hemodynamic data     A. Aortic pressure:  100/54.     B. Left ventricular pressure:  100/11.  2. Left ventriculography:  Mild inferior wall hypokinesis was noted.  EF     60%.  No MR.  3. Selective coronary angiography     A. Left main coronary:  Widely patent.  Calcification is noted.     B. Left anterior descending coronary:  Heavily calcified, particularly in        the proximal and the mid segment.  Irregularities with up to 60%        stenosis noted in multiple spots within the mid segment.  The first        diagonal contains 60% to 70% mid vessel stenosis.  This diagonal is        relatively small.     C. Ramus branch:  The ramus  intermedius is large.  There is ostial 50%        narrowing.     D. Circumflex artery:  The circumflex artery contains a 60% to 70%        segmental stenosis within a bend.  This occurs within the proximal and        mid vessel.  This is unchanged from the prior angiogram.     E. Right coronary:  The right coronary is large and dominant.  The        stented region, which encompasses the proximal vessel and the mid        vessel, was widely patent.  There are distal luminal irregularities        with up to 30% narrowing.  The PDA is large.  No significant  obstruction is noted in the PDA or the left ventricular branch.  There        is a 50% PDA narrowing.   CONCLUSIONS:  1. Moderate left anterior descending artery and circumflex disease.  2. Widely patent right coronary stents.  3. Overall normal ejection fraction with inferior wall hypokinesis.  4. Inferior ischemia on Cardiolite study 4 weeks following percutaneous     coronary intervention, probably     secondary to false positive phenomenon that is known to occur within 6 to     8 weeks after intervention in a treated territory.   PLAN:  Continued medical therapy and cardiac rehabilitation.  Further  management per Dr. Mayford Knife.                                               Lesleigh Noe, M.D.    HWS/MEDQ  D:  08/30/2002  T:  08/30/2002  Job:  [id number]   cc:   Armanda Magic, M.D.  [address]  [place], [place] [address]  Fax: [phone]   Dellis Anes. Idell Pickles, M.D.  [address]  [place]  [place] [address]  Fax: [phone]

## 2010-11-08 NOTE — Cardiovascular Report (Signed)
NAME:  Jason [name], Jason [name]                       ACCOUNT NO.:  [id number]   MEDICAL RECORD NO.:  [id number]                   PATIENT TYPE:  INP   LOCATION:  2019                                 FACILITY:  [facility]   PHYSICIAN:  Francisca December, M.D.               DATE OF BIRTH:  19-Jun-1951   DATE OF PROCEDURE:  07/15/2002  DATE OF DISCHARGE:                              CARDIAC CATHETERIZATION   PROCEDURE PERFORMED:  Percutaneous coronary intervention/drug-eluting stent  implantation, mid right coronary artery.   INDICATIONS:  The patient is a 60 year old man who was admitted yesterday  with prolonged anginal chest pain. He ruled in for a non ST segment  myocardial infarction.  Dr. Meade Maw had completed coronary angiography  revealing a complete occlusion of the right coronary artery. There is some  minimal distal collateral flow.  He is brought to the cardiac  catheterization laboratory at this time to provide for percutaneous coronary  intervention.   DESCRIPTION OF PROCEDURE:  The patient was returned to the cardiac  catheterization laboratory approximately two hours after completion of the  diagnostic procedure. He had his previously placed 6 French catheter sheath  in place.  This was prepped and draped in the usual sterile fashion.  It was  exchanged for a 7 [place] catheter sheath over a long guiding J wire.  After  a change of sterile gloves, I proceeded with coronary intervention.  A 7  [place] FR4 guiding catheter was advanced to the ascending aorta where the  right coronary os was engaged. A 0.014 Scimed Choice PT2 intracoronary guide  wire was passed across the lesion without difficulty.  Initial balloon  dilatation was performed with a 3.0/20 mm Scimed Maverick intracoronary  balloon.  This was inflated to 4 atmospheres for 2 minutes.  This balloon  was removed.  It did restore antegrade flow. A 3.0/33 mm Cordis Cypher  intracoronary drug-eluting stent was advanced  into position in the more  distal portion of the right coronary such that it covered from the junction  of the mid and distal segment to the junction of the proximal and mid  section. It was deployed there at a peak pressure of 14 atmospheres for 1  minute.  This stent balloon was removed and a 3.0/18 mm Cordis Cypher drug-  eluting stent was positioned proximally so that it overlapped the previously  placed stent and extended to about 16 mm from the ostium.  It was deployed  at a peak pressure of 20 atmospheres for 45 seconds.  The same stent balloon  was then advanced twice into the more distal portion of the stent and  inflated to a peak pressure of 20 atmospheres for 30 seconds each.   Following conformation of adequate patency in orthogonal views, both with  and without the guide wire in place, the guiding catheter was removed.  Hemostasis was achieved by suturing the sheath in place.  The patient was  transported to the recovery area in stable condition with an intact distal  pulse.   It should be noted the patient received 4500 units of heparin prior to  passage of the guide wire. He also received a double bolus of Integrilin in  constant infusion.  Initial ACT was 268 seconds falling to 258 seconds by  the completion of the procedure.   FINAL IMPRESSION:  1. Atherosclerotic coronary vascular disease, single-vessel.  2.     Status post non ST segment elevation myocardial infarction.  3. Status post successful percutaneous transluminal coronary angioplasty and     drug-eluting stent implantation x2 mid and proximal right coronary     artery.                                                  Francisca December, M.D.    JHE/MEDQ  D:  07/15/2002  T:  07/16/2002  Job:  [id number]   cc:   Dellis Anes. Idell Pickles, M.D.  [address]  [place]  [place] [address]  Fax: [phone]   Armanda Magic, M.D.  [address]  [place], [place] [address]  Fax: [phone]

## 2010-11-08 NOTE — H&P (Signed)
NAME:  Jason [name], Jason [name]                       ACCOUNT NO.:  [id number]   MEDICAL RECORD NO.:  [id number]                   PATIENT TYPE:  INP   LOCATION:  6531                                 FACILITY:  [facility]   PHYSICIAN:  Cassell Clement, M.D.              DATE OF BIRTH:  Sep 03, 1950   DATE OF ADMISSION:  12/09/2002  DATE OF DISCHARGE:                                HISTORY & PHYSICAL   CHIEF COMPLAINT:  Chest pain.   HISTORY:  This is a 60 year old divorced Caucasian male admitted with chest  pain. He has a history of known coronary artery disease. He has had a  history of a myocardial infarction in January 2004 treated by Dr. Verdis Prime with a stent. We do not have any details of the hospital stay and his  old records are not presently available. The patient states he was  readmitted in about March 2004 with exertional chest pain and had a follow-  up cath, which showed that the stent was okay and that there was no  progression of his other native disease. 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 He also felt very anxious and initially  went to the behavioral health center after talking with Foye Deer, M.D.  his primary care physician. He was then sent from behavioral health to  [facility] emergency room where he was evaluated and then transferred to  [facility] where he was admitted as a rule out MI. He had substernal chest pain of  a squeezing, pressure-like nature associated with dyspnea. He did become  clammy. There was no nausea. There was eventual relief after three  nitroglycerin.   CURRENT MEDICATIONS:  His present outpatient medications are:  1. Altace 5 mg daily.  2. Plavix 75 mg daily.  3. Zocor 40 mg daily.  4. Toprol 25 mg daily.  5. Aspirin 325 mg daily.  6. Lexapro 20 mg daily.  7. Lorazepam p.r.n.   SOCIAL HISTORY:  He is divorced. He works as a Psychologist, occupational. He smokes one pack a  day. He does not use alcohol.   OPERATIONS:   None except for angioplasty.   REVIEW OF SYMPTOMS:  GI:  No history of ulcer of change in bowel habits,  hematochezia, or melena. GENITOURINARY:  No symptoms of dysuria.  RESPIRATORY:  Slight cough secondary to cigarettes. NEURO/PSYCHIATRIC:  He  has a history of anxiety attacks and agoraphobia and is treated by  behavioral health center.   PHYSICAL EXAMINATION:  VITAL SIGNS:  Blood pressure 124/50, pulse 50 and  regular.  GENERAL:  He is a well-developed, well-nourished tan gentleman in no acute  distress.  HEENT:  Jugular venous pressure normal. Carotids normal. Thyroid normal.  CHEST:  Clear.  HEART:  S1 normal, S2 normal. There is no murmur, gallop, rub, or click.  ABDOMEN:  Soft and nontender.  EXTREMITIES:  Good femoral and pedal  pulses. No phlebitis, no edema.   LABORATORY DATA:  His electrocardiogram is nonacute. Laboratory studies from  [facility] are negative for acute myocardial infarction.   IMPRESSION:  1. Chest pain, rule out myocardial infarction.  2. Past history of PTCA stent in January 2004 with old records not     available.  3. History of hypercholesterolemia.  4. History of anxiety and agoraphobia.   DISPOSITION:  Admit to telemetry to Dr. Fraser Din. Get serial enzymes. Keep  n.p.o. for possible treadmill Cardiolite study later today.                                                 Cassell Clement, M.D.    TB/MEDQ  D:  12/09/2002  T:  12/09/2002  Job:  [id number]   cc:   Armanda Magic, M.D.  [address]  [place], [place] [address]  Fax: [phone]   Meade Maw, M.D.  [address]  [place]  [place] [address]  Fax: [phone]   Dellis Anes. Idell Pickles, M.D.  [address]  [place]  [place] [address]  Fax: [phone]    cc:   Armanda Magic, M.D.  [address]  [place], [place] [address]  Fax: [phone]   Meade Maw, M.D.  [address]  [place]  [place] [address]  Fax: [phone]   Dellis Anes.  Idell Pickles, M.D.  [address]  [place]  [place] [address]  Fax: [phone]

## 2010-11-08 NOTE — Discharge Summary (Signed)
NAME:  Jason [name], Jason [name]                       ACCOUNT NO.:  [id number]   MEDICAL RECORD NO.:  [id number]                   PATIENT TYPE:  INP   LOCATION:  0344                                 FACILITY:  [facility]   PHYSICIAN:  Hollice Espy, M.D.            DATE OF BIRTH:  [birth year]   DATE OF ADMISSION:  08/22/2003  DATE OF DISCHARGE:  08/25/2003                                 DISCHARGE SUMMARY   CONSULTANT:  Dr. Antonietta Breach.   DISCHARGE DIAGNOSIS:  1. Schizoaffective disorder.  2. Suicide attempt by ingestion of Toprol.  3. History of hypertension.  4. History of myocardial infarction x2.  5. History of anxiety.  6. History of deep venous thrombosis.   DISCHARGE MEDICATIONS:  1. Plavix 75 mg p.o. daily.  2. Zocor 40 mg p.o. nightly.  3. Toprol-XL 25 mg p.o. b.i.d.  4. Aspirin 325 mg p.o. daily.  5. Zoloft, which will be determined by the mental health facility physician     that the patient sees.   HISTORY OF PRESENT ILLNESS:  This is a 60 year old white male with past  medical history of depression, MI and as above, who presented to the ER on  the early morning of August 22, 2003 following a drug overdose.  The patient,  as obtained from later history, had apparently been hearing voices for a  very, very long time, having difficult problems with heavy anxiety and panic  attacks and felt that he could not take it any more.  He said that he wanted  to try to kill himself and took a lot of Toprol to do so.  __________  He  stated to me that this was his first time that he had ever tried to attempt  suicide.  He was brought into the emergency room.  His vitals were all  stable.  He was slightly lethargic but otherwise arousable and able to  answer all questions.   HOSPITAL COURSE:  He was put on a telemetry bed and sent to the floor.  He  was started on IV fluids.  His heart rate remained stable, as did his blood  pressure, although he was slightly hypotensive.  He was  started on IV fluids  and his blood pressure remained stable.  By the second day, August 23, 2003,  the patient was much more awake and alert and oriented  He told me that he  had been feeling much better, better than he had in a long time, and not  hearing any voices.  Dr. Antonietta Breach from psychiatry evaluated the  patient and felt that he had full schizoaffective disorder and able to  function quite well in society, as that is how he had been able to function  for the last 20 years.  He furthermore recommended that the patient did  indeed pose a threat for possible suicidal ideation and recommended that the  patient be transferred to a  mental health facility.  Following this, the  patient was evaluated by behavioral health, who, due to financial reasons,  could not accept the patient at the time that it was recommended that the  patient be sponsored for an inpatient admission by the county local mental  health facility.  The patient currently is doing well, he has no complaints,  his vital signs including heart rate and blood pressure have been stable,  his telemetry and IV fluids were discontinued, his medications were all  resumed including his Toprol.  He was felt to be medically stable for  discharge on August 25, 2003 to the mental health facility.   DISCHARGE DIET:  His discharge diet will be a cardiac rehab diet.   FOLLOWUP:  His further psychiatric management will be followed by the  physician at the mental health facility.  Following an inpatient admission,  the patient may respond well and at some point be able to be discharged to  home safely.   DISPOSITION:  His disposition is improved from his admission.  The rest of  his medical issues including his history of hypertension and MRI were not  active issues during this admission.   ACTIVITY:  Activity will be as per dictated by the mental health facility.                                               Hollice Espy,  M.D.    SKK/MEDQ  D:  08/25/2003  T:  08/25/2003  Job:  [id number]   cc:   Antonietta Breach, M.D.  [address]  [place], [place] [address]  Fax: [phone]

## 2010-11-08 NOTE — H&P (Signed)
Jason [name], Jason [name]             ACCOUNT NO.:  [id number]   MEDICAL RECORD NO.:  [id number]          PATIENT TYPE:  EMS   LOCATION:  MAJO                         FACILITY:  [facility]   PHYSICIAN:  Jackie Plum, M.D.DATE OF BIRTH:  07-Sep-1950   DATE OF ADMISSION:  10/07/2004  DATE OF DISCHARGE:                                HISTORY & PHYSICAL   PRIMARY CARE PHYSICIAN:  Dellis Anes. Idell Pickles, M.D.   PROBLEM LIST:  1.  Chest pain, resolved, rule out myocardial infarction/ischemia.  2.  Hyperglycemia probably related to type 2 diabetes.  3.  Hyponatremia.  4.  Dehydration.  5.  Renal insufficiency.   CHIEF COMPLAINT:  Chest pain.   HISTORY OF PRESENT ILLNESS:  The patient is a 60 year old Caucasian  gentleman with a previous history of coronary artery disease, status post  angioplasty, status post MI x 2, history of depression and cigarette  smoking.  He presented today to the ED with complaints of chest pain.  According to the patient, he was in his usual state of health until about  five days ago when he started to experience recurrent chest heaviness with  some tightness as well.  Pain was mainly situated in the substernal area  without any radiation.  His symptoms were associated with some shortness of  breath and diaphoresis, as well as nausea, and actually he vomited about  three times yesterday.  According to the patient, he got concerned and came  to the ED because yesterday he had gone to mow his mother's yard for her and  doing this he had an episode of his chest symptoms with fatigue and  weakness.  He said he went to lay down a while and took some of his  nitroglycerin without any improvement.  He also vomited three times as  stated above.  His symptoms of pain had improved somewhat.  He came to the  ED, at which point he was given nitroglycerin with resolution of his chest  pain.  He indicated that he had continued to feel weak with polyuria without  polydipsia.  His  denies any PND, orthopnea, fever, chills, cough or sputum  production.  At the emergency room, he was noted to have a glucose of more  than 1000 mg/dl in his blood.  Therefore, all of these episodes are the  reason for admission.   PAST MEDICAL HISTORY:  As noted above.  He denies any previous known history  of diabetes mellitus.   MEDICATION ALLERGIES:  The patient is allergic to SULFA MEDICINES.   CURRENT MEDICATIONS:  Nitroglycerin, baby aspirin and __________, as well as  occasional Zantac for gastrointestinal reflux disease.   FAMILY HISTORY:  Positive for diabetes mellitus.   SOCIAL HISTORY:  The patient is a Psychologist, occupational.  He does not drink alcohol, but  continues to smoke about one packs of cigarettes per day.  He indicated that  he has been smoking for a long time.   REVIEW OF SYSTEMS:  Significant positives and negatives as listed under HPI.  The rest of the review of systems was unremarkable.   PHYSICAL EXAMINATION:  VITAL SIGNS:  The BP was 150/87, pulse 91,  respirations 13, temperature 97.0 degrees Fahrenheit and O2 saturation of  90% on oxygen at 2 L/min by nasal cannula.  GENERAL APPEARANCE:  The patient was not in any acute distress.  He was  chest pain-free at the time of my evaluation.  HEENT:  Normocephalic and atraumatic.  Pupils were equal, round and reactive  to light.  Extraocular movements were intact.  Oropharynx was dry with no  irritation or erythema.  NECK:  Supple.  No JVD.  No thyromegaly.  LUNGS:  Clear breath sounds.  No crackles or wheezes.  CARDIAC:  Regular rate and rhythm.  No gallops or murmurs.  ABDOMEN:  Soft and nontender.  Bowel sounds present.  EXTREMITIES:  No cyanosis.  No edema.  CENTRAL NERVOUS SYSTEM:  Exam was nonfocal.   LABORATORY WORK:  X-ray revealed no acute infiltrate and cardiac size was  normal.  EKG showed sinus rhythm at 88 beats per minute.  He had about 1 mm  T-wave inversions in the inferior leads, i.e. aVF 2 and 3.  WBC  count 10.8,  hemoglobin 15.7, hematocrit 35.7, platelet count 273 and MCV 93.5.  Sodium  was 117, potassium 4.1, chloride 83, CO2 18 mEq/L (upper limit of normal 32  and lower limit of normal 19), glucose 993 mg/dl, BUN 32, creatinine 1.8,  calcium 9.1, total protein 7.4, albumin 7.4, albumin 4.1, AST 17, ALT 21,  alkaline phosphatase 138 and total bilirubin 1.5.  Point of care cardiac  markers within normal limits.  INR 1.1, PT 13.9, PTT 39 seconds.   IMPRESSION AND PLAN:  1.  Chest pain, quite atypical, in a patient with hyperglycemia, a family      history of diabetes and a previous history of coronary artery disease.      The patient will be admitted.  He is chest pain-free right now.  He will      be ruled out for MI and scheduled for stress test hopefully prior to      discharge.  His cardiologist will be notified for further cardiac      evaluation.  2.  For his hyperglycemia, will start him on IV insulin per [facility]      protocol.  3.  Hyponatremia.  The patient's corrected sodium is about 130.  Will      continue IV fluids with saline and follow up medically.  He is      dehydrated and will be rehydrated with IV fluids.  The patient has renal      insufficiency.  His baseline creatinine is not clear at this point.  I      tried to view records from the echart, however, the echart is not able      to open up on the computer at this point.  Will need to be followed up      as well clinically.  Further recommendations will be made as the      database expands.   The patient may be a diabetic, probably type 2 diabetes.  We will check  hemoglobin A1C.  Will initiate oral hypoglycemics when off IV insulin.     GO/MEDQ  D:  10/07/2004  T:  10/07/2004  Job:  [id number]   cc:   Dellis Anes. Idell Pickles, M.D.  [address]  [place]  [place] [address]  Fax: [phone]

## 2011-03-28 LAB — CBC
HCT: 36.5 % — ABNORMAL LOW (ref 39.0–52.0)
HCT: 38.1 % — ABNORMAL LOW (ref 39.0–52.0)
HCT: 39.2 % (ref 39.0–52.0)
Hemoglobin: 12.8 g/dL — ABNORMAL LOW (ref 13.0–17.0)
Hemoglobin: 13.4 g/dL (ref 13.0–17.0)
Hemoglobin: 13.5 g/dL (ref 13.0–17.0)
MCHC: 34.6 g/dL (ref 30.0–36.0)
MCHC: 35 g/dL (ref 30.0–36.0)
MCHC: 35.2 g/dL (ref 30.0–36.0)
MCV: 94.3 fL (ref 78.0–100.0)
MCV: 94.5 fL (ref 78.0–100.0)
MCV: 95.1 fL (ref 78.0–100.0)
Platelets: 139 10*3/uL — ABNORMAL LOW (ref 150–400)
Platelets: 167 10*3/uL (ref 150–400)
Platelets: 178 10*3/uL (ref 150–400)
RBC: 3.87 MIL/uL — ABNORMAL LOW (ref 4.22–5.81)
RBC: 4.01 MIL/uL — ABNORMAL LOW (ref 4.22–5.81)
RBC: 4.14 MIL/uL — ABNORMAL LOW (ref 4.22–5.81)
RDW: 12.2 % (ref 11.5–15.5)
RDW: 12.9 % (ref 11.5–15.5)
RDW: 13 % (ref 11.5–15.5)
WBC: 4.9 10*3/uL (ref 4.0–10.5)
WBC: 5.9 10*3/uL (ref 4.0–10.5)

## 2011-03-28 LAB — GLUCOSE, CAPILLARY
Glucose-Capillary: 119 mg/dL — ABNORMAL HIGH (ref 70–99)
Glucose-Capillary: 181 mg/dL — ABNORMAL HIGH (ref 70–99)
Glucose-Capillary: 196 mg/dL — ABNORMAL HIGH (ref 70–99)
Glucose-Capillary: 196 mg/dL — ABNORMAL HIGH (ref 70–99)
Glucose-Capillary: 226 mg/dL — ABNORMAL HIGH (ref 70–99)
Glucose-Capillary: 238 mg/dL — ABNORMAL HIGH (ref 70–99)
Glucose-Capillary: 261 mg/dL — ABNORMAL HIGH (ref 70–99)
Glucose-Capillary: 269 mg/dL — ABNORMAL HIGH (ref 70–99)
Glucose-Capillary: 282 mg/dL — ABNORMAL HIGH (ref 70–99)
Glucose-Capillary: 313 mg/dL — ABNORMAL HIGH (ref 70–99)
Glucose-Capillary: 316 mg/dL — ABNORMAL HIGH (ref 70–99)

## 2011-03-28 LAB — LIPID PANEL
Cholesterol: 157 mg/dL (ref 0–200)
HDL: 25 mg/dL — ABNORMAL LOW (ref >39)
LDL Cholesterol: 111 mg/dL — ABNORMAL HIGH (ref 0–99)
Total CHOL/HDL Ratio: 6.3 RATIO
Triglycerides: 107 mg/dL (ref <150)
VLDL: 21 mg/dL (ref 0–40)

## 2011-03-28 LAB — CARDIAC PANEL(CRET KIN+CKTOT+MB+TROPI)
CK, MB: 0.4 ng/mL (ref 0.3–4.0)
CK, MB: 0.5 ng/mL (ref 0.3–4.0)
Relative Index: INVALID (ref 0.0–2.5)
Relative Index: INVALID (ref 0.0–2.5)
Total CK: 59 U/L (ref 7–232)
Total CK: 75 U/L (ref 7–232)
Troponin I: 0.01 ng/mL (ref 0.00–0.06)
Troponin I: 0.01 ng/mL (ref 0.00–0.06)

## 2011-03-28 LAB — TSH: TSH: 3.002 u[IU]/mL (ref 0.350–4.500)

## 2011-03-28 LAB — RAPID URINE DRUG SCREEN, HOSP PERFORMED
Amphetamines: NOT DETECTED
Barbiturates: NOT DETECTED
Benzodiazepines: NOT DETECTED
Cocaine: NOT DETECTED
Opiates: NOT DETECTED
Tetrahydrocannabinol: NOT DETECTED

## 2011-03-28 LAB — POCT CARDIAC MARKERS
CKMB, poc: <1 ng/mL — ABNORMAL LOW (ref 1.0–8.0)
Myoglobin, poc: 89 ng/mL (ref 12–200)
Troponin i, poc: <0.05 ng/mL (ref 0.00–0.09)

## 2011-03-28 LAB — DIFFERENTIAL
Basophils Absolute: 0 10*3/uL (ref 0.0–0.1)
Basophils Relative: 1 % (ref 0–1)
Eosinophils Absolute: 0 10*3/uL (ref 0.0–0.7)
Eosinophils Relative: 0 % (ref 0–5)
Lymphocytes Relative: 12 % (ref 12–46)
Lymphs Abs: 0.7 10*3/uL (ref 0.7–4.0)
Monocytes Absolute: 0.5 10*3/uL (ref 0.1–1.0)
Monocytes Relative: 9 % (ref 3–12)
Neutro Abs: 4.6 10*3/uL (ref 1.7–7.7)
Neutrophils Relative %: 78 % — ABNORMAL HIGH (ref 43–77)

## 2011-03-28 LAB — COMPREHENSIVE METABOLIC PANEL
ALT: 20 U/L (ref 0–53)
AST: 19 U/L (ref 0–37)
Albumin: 3.8 g/dL (ref 3.5–5.2)
Alkaline Phosphatase: 91 U/L (ref 39–117)
BUN: 10 mg/dL (ref 6–23)
CO2: 23 mEq/L (ref 19–32)
Calcium: 9.6 mg/dL (ref 8.4–10.5)
Chloride: 100 mEq/L (ref 96–112)
Creatinine, Ser: 1.19 mg/dL (ref 0.4–1.5)
GFR calc Af Amer: >60 mL/min (ref >60)
GFR calc non Af Amer: >60 mL/min (ref >60)
Glucose, Bld: 304 mg/dL — ABNORMAL HIGH (ref 70–99)
Potassium: 4.4 mEq/L (ref 3.5–5.1)
Sodium: 132 mEq/L — ABNORMAL LOW (ref 135–145)
Total Bilirubin: 0.7 mg/dL (ref 0.3–1.2)
Total Protein: 6.2 g/dL (ref 6.0–8.3)

## 2011-03-28 LAB — BASIC METABOLIC PANEL
BUN: 10 mg/dL (ref 6–23)
CO2: 25 mEq/L (ref 19–32)
Calcium: 8.9 mg/dL (ref 8.4–10.5)
Chloride: 106 mEq/L (ref 96–112)
Creatinine, Ser: 1.18 mg/dL (ref 0.4–1.5)
GFR calc Af Amer: >60 mL/min (ref >60)
GFR calc non Af Amer: >60 mL/min (ref >60)
Glucose, Bld: 325 mg/dL — ABNORMAL HIGH (ref 70–99)
Potassium: 4 mEq/L (ref 3.5–5.1)
Sodium: 135 mEq/L (ref 135–145)

## 2011-03-28 LAB — CULTURE, BLOOD (ROUTINE X 2)
Culture: NO GROWTH
Culture: NO GROWTH

## 2011-03-28 LAB — B-NATRIURETIC PEPTIDE (CONVERTED LAB): Pro B Natriuretic peptide (BNP): 44.6 pg/mL (ref 0.0–100.0)

## 2011-03-28 LAB — HEMOGLOBIN A1C
Hgb A1c MFr Bld: 12.2 % — ABNORMAL HIGH (ref 4.6–6.1)
Mean Plasma Glucose: 303 mg/dL

## 2011-06-11 ENCOUNTER — Ambulatory Visit: Payer: Self-pay

## 2011-06-11 DIAGNOSIS — I2510 Atherosclerotic heart disease of native coronary artery without angina pectoris: Secondary | ICD-10-CM

## 2011-06-11 DIAGNOSIS — F411 Generalized anxiety disorder: Secondary | ICD-10-CM

## 2011-06-11 DIAGNOSIS — Z23 Encounter for immunization: Secondary | ICD-10-CM

## 2011-07-11 ENCOUNTER — Ambulatory Visit (INDEPENDENT_AMBULATORY_CARE_PROVIDER_SITE_OTHER): Payer: Self-pay

## 2011-07-11 DIAGNOSIS — S8263XA Displaced fracture of lateral malleolus of unspecified fibula, initial encounter for closed fracture: Secondary | ICD-10-CM

## 2011-07-11 DIAGNOSIS — M79609 Pain in unspecified limb: Secondary | ICD-10-CM

## 2011-07-11 DIAGNOSIS — E119 Type 2 diabetes mellitus without complications: Secondary | ICD-10-CM

## 2011-08-25 ENCOUNTER — Other Ambulatory Visit: Payer: Self-pay | Admitting: Internal Medicine

## 2011-09-22 HISTORY — PX: CORONARY ANGIOGRAM: SHX5786

## 2011-10-02 ENCOUNTER — Encounter (HOSPITAL_COMMUNITY): Payer: Self-pay | Admitting: *Deleted

## 2011-10-02 ENCOUNTER — Observation Stay (HOSPITAL_COMMUNITY)
Admission: EM | Admit: 2011-10-02 | Discharge: 2011-10-04 | Disposition: A | Discharge status: Home / Self Care | Payer: Self-pay | Attending: Internal Medicine | Admitting: Internal Medicine

## 2011-10-02 ENCOUNTER — Emergency Department (HOSPITAL_COMMUNITY): Payer: Self-pay

## 2011-10-02 DIAGNOSIS — R0602 Shortness of breath: Secondary | ICD-10-CM | POA: Insufficient documentation

## 2011-10-02 DIAGNOSIS — I739 Peripheral vascular disease, unspecified: Secondary | ICD-10-CM | POA: Insufficient documentation

## 2011-10-02 DIAGNOSIS — R079 Chest pain, unspecified: Secondary | ICD-10-CM | POA: Diagnosis present

## 2011-10-02 DIAGNOSIS — I10 Essential (primary) hypertension: Secondary | ICD-10-CM | POA: Insufficient documentation

## 2011-10-02 DIAGNOSIS — E785 Hyperlipidemia, unspecified: Secondary | ICD-10-CM | POA: Diagnosis present

## 2011-10-02 DIAGNOSIS — I2510 Atherosclerotic heart disease of native coronary artery without angina pectoris: Secondary | ICD-10-CM | POA: Insufficient documentation

## 2011-10-02 DIAGNOSIS — E119 Type 2 diabetes mellitus without complications: Secondary | ICD-10-CM | POA: Diagnosis present

## 2011-10-02 DIAGNOSIS — I25810 Atherosclerosis of coronary artery bypass graft(s) without angina pectoris: Principal | ICD-10-CM | POA: Insufficient documentation

## 2011-10-02 DIAGNOSIS — I200 Unstable angina: Secondary | ICD-10-CM | POA: Insufficient documentation

## 2011-10-02 DIAGNOSIS — F17200 Nicotine dependence, unspecified, uncomplicated: Secondary | ICD-10-CM | POA: Insufficient documentation

## 2011-10-02 DIAGNOSIS — I252 Old myocardial infarction: Secondary | ICD-10-CM | POA: Insufficient documentation

## 2011-10-02 HISTORY — DX: Essential (primary) hypertension: I10

## 2011-10-02 HISTORY — DX: Acute myocardial infarction, unspecified: I21.9

## 2011-10-02 HISTORY — DX: Pure hypercholesterolemia, unspecified: E78.00

## 2011-10-02 HISTORY — DX: Atherosclerotic heart disease of native coronary artery without angina pectoris: I25.10

## 2011-10-02 HISTORY — DX: Gastro-esophageal reflux disease without esophagitis: K21.9

## 2011-10-02 HISTORY — DX: Unspecified osteoarthritis, unspecified site: M19.90

## 2011-10-02 HISTORY — DX: Anxiety disorder, unspecified: F41.9

## 2011-10-02 LAB — PROTIME-INR
INR: 0.92 (ref 0.00–1.49)
Prothrombin Time: 12.6 seconds (ref 11.6–15.2)

## 2011-10-02 LAB — BASIC METABOLIC PANEL
BUN: 12 mg/dL (ref 6–23)
Chloride: 101 mEq/L (ref 96–112)
GFR calc Af Amer: 73 mL/min — ABNORMAL LOW (ref >90)
GFR calc non Af Amer: 63 mL/min — ABNORMAL LOW (ref >90)
Potassium: 3.7 mEq/L (ref 3.5–5.1)
Sodium: 137 mEq/L (ref 135–145)

## 2011-10-02 LAB — CARDIAC PANEL(CRET KIN+CKTOT+MB+TROPI)
CK, MB: 2.5 ng/mL (ref 0.3–4.0)
Relative Index: INVALID (ref 0.0–2.5)
Total CK: 60 U/L (ref 7–232)
Troponin I: <0.3 ng/mL (ref <0.30)

## 2011-10-02 LAB — CBC
MCHC: 35.3 g/dL (ref 30.0–36.0)
Platelets: 259 10*3/uL (ref 150–400)
RDW: 13.1 % (ref 11.5–15.5)
WBC: 11.9 10*3/uL — ABNORMAL HIGH (ref 4.0–10.5)

## 2011-10-02 LAB — POCT I-STAT TROPONIN I

## 2011-10-02 MED ORDER — SODIUM CHLORIDE 0.9 % IV BOLUS (SEPSIS)
500.0000 mL | Freq: Once | INTRAVENOUS | Status: AC
  Administered 2011-10-02: 500 mL via INTRAVENOUS

## 2011-10-02 MED ORDER — NITROGLYCERIN 0.4 MG SL SUBL
0.4000 mg | SUBLINGUAL_TABLET | SUBLINGUAL | Status: DC | PRN
  Administered 2011-10-02: 0.4 mg via SUBLINGUAL
  Filled 2011-10-02: qty 25

## 2011-10-02 NOTE — ED Notes (Signed)
Admitting MD at bedside.

## 2011-10-02 NOTE — ED Provider Notes (Signed)
History     CSN: [id number]  Arrival date & time 10/02/11  2049   First MD Initiated Contact with Patient 10/02/11 2121      Chief Complaint  Patient presents with  . Chest Pain  . Fatigue    (Consider location/radiation/quality/duration/timing/severity/associated sxs/prior treatment) Patient is a 61 y.o. male presenting with chest pain. The history is provided by the patient.  Chest Pain Episode onset: 5 days ago. Chest pain occurs intermittently. The chest pain is worsening. The pain is associated with exertion. At its most intense, the pain is at 8/10. The pain is currently at 3/10. The severity of the pain is moderate. The quality of the pain is described as aching, heavy and tightness. The pain radiates to the left shoulder. Chest pain is worsened by exertion (He was able to mow the yard or take his trash out today due to worsening chest pain and shortness of breath). Primary symptoms include fatigue, shortness of breath, cough and nausea. Pertinent negatives for primary symptoms include no fever, no abdominal pain, no vomiting and no dizziness.  Associated symptoms include weakness.  Pertinent negatives for associated symptoms include no diaphoresis and no lower extremity edema. He tried aspirin for the symptoms. Risk factors include male gender and smoking/tobacco exposure.  His past medical history is significant for CAD, diabetes, hyperlipidemia, hypertension and MI.  Procedure history is positive for cardiac catheterization.     Past Medical History  Diagnosis Date  . Myocardial infarct   . Coronary artery disease   . Anxiety   . Hypertension   . Diabetes mellitus   . Arthritis   . GERD (gastroesophageal reflux disease)   . High cholesterol     Past Surgical History  Procedure Date  . Coronary stent placement   . Coronary artery bypass graft   . Femoral artery - femoral artery bypass graft     History reviewed. No pertinent family history.  History  Substance  Use Topics  . Smoking status: Current Everyday Smoker -- 1.0 packs/day  . Smokeless tobacco: Not on file  . Alcohol Use: Yes     socially      Review of Systems  Constitutional: Positive for fatigue. Negative for fever and diaphoresis.  Respiratory: Positive for cough and shortness of breath.   Cardiovascular: Positive for chest pain.  Gastrointestinal: Positive for nausea. Negative for vomiting and abdominal pain.  Neurological: Positive for weakness. Negative for dizziness.  All other systems reviewed and are negative.    Allergies  Sulfa antibiotics  Home Medications   Current Outpatient Rx  Name Route Sig Dispense Refill  . ALPRAZOLAM 0.5 MG PO TABS Oral Take 0.5 mg by mouth daily as needed. For anxiety.    . ASPIRIN 325 MG PO TABS Oral Take 325 mg by mouth daily.    Marland Kitchen DOXEPIN HCL 25 MG PO CAPS Oral Take 50 mg by mouth at bedtime.    Marland Kitchen METFORMIN HCL 500 MG PO TABS Oral Take 500 mg by mouth 2 (two) times daily with a meal.    . METOPROLOL TARTRATE 25 MG PO TABS Oral Take 25 mg by mouth 2 (two) times daily.    Marland Kitchen NITROGLYCERIN 0.4 MG SL SUBL Sublingual Place 0.4 mg under the tongue every 5 (five) minutes x 3 doses as needed. For chest pain.    Marland Kitchen PAROXETINE HCL 20 MG PO TABS Oral Take 20 mg by mouth every morning.    Marland Kitchen PRAVASTATIN SODIUM 40 MG PO TABS Oral Take  40 mg by mouth daily.    . TRAMADOL HCL 50 MG PO TABS Oral Take 50 mg by mouth every 6 (six) hours as needed. For pain.      BP 126/55  Pulse 80  Temp(Src) 97.5 F (36.4 C) (Oral)  Resp 18  SpO2 94%  Physical Exam  Nursing note and vitals reviewed. Constitutional: He is oriented to person, place, and time. He appears well-developed and well-nourished. No distress.  HENT:  Head: Normocephalic and atraumatic.  Mouth/Throat: Oropharynx is clear and moist.  Eyes: Conjunctivae and EOM are normal. Pupils are equal, round, and reactive to light.  Neck: Normal range of motion. Neck supple.  Cardiovascular: Normal  rate, regular rhythm and intact distal pulses.   No murmur heard. Pulmonary/Chest: Effort normal and breath sounds normal. No respiratory distress. He has no wheezes. He has no rales.  Abdominal: Soft. He exhibits no distension. There is no tenderness. There is no rebound and no guarding.  Musculoskeletal: Normal range of motion. He exhibits no edema and no tenderness.  Neurological: He is alert and oriented to person, place, and time.  Skin: Skin is warm and dry. No rash noted. No erythema.  Psychiatric: He has a normal mood and affect. His behavior is normal.    ED Course  Procedures (including critical care time)   Labs Reviewed  CBC  BASIC METABOLIC PANEL  CARDIAC PANEL(CRET KIN+CKTOT+MB+TROPI)  PROTIME-INR  APTT   No results found.   Date: 10/02/2011  Rate: 89  Rhythm: normal sinus rhythm  QRS Axis: normal  Intervals: normal  ST/T Wave abnormalities: nonspecific ST/T changes  Conduction Disutrbances:right bundle branch block  Narrative Interpretation:   Old EKG Reviewed: unchanged    1. Unstable angina       MDM   Pt with symptoms concerning for ACS with persistent chest pain since Sunday but worsening with any type of exertion. Patient has taken 2-325 aspirin home prior to coming but was out of nitroglycerin was unable to take that.  TIMI 4 . Associated symptoms include nausea, shortness of breath.  NTG given. EKG shows a persistent right bundle branch block without any new changes. CXR, CBC, BMP, CE, Coags pending. The patient is seen by [facility] heart and vascular. His last cath was about one year ago and it showed diffuse 40-50% lesions in the LAD and diffuse disease in the RCA but nothing requiring stenting at that time. However patient has continued to smoke and has not modified his lifestyle and concern for new blockage.  11:11 PM Labs within normal limits. X-ray negative. Given patient's strong cardiac history we'll admit for rule out and further  evaluation by cardiology.      Gwyneth Sprout, MD 10/02/11 2311

## 2011-10-02 NOTE — ED Notes (Addendum)
Received report from [employer]. Pt stated that he came to the ED because he has been having intermittent left side CP since Sunday. Starting today his heart felt like it was fluttering and he started having SOB.No n/v.  Currently CP is 6 out of 10. No respiratory distress. Dr. Anitra Lauth is aware of CP. Will continue to monitor.

## 2011-10-02 NOTE — ED Notes (Signed)
After taking 1 Nitroglycerin Sublingual pts BP decreased to 86/49 and HR 82. Pt complaining of dizziness. Notified EDP. Will continue to monitor.

## 2011-10-02 NOTE — ED Notes (Signed)
Patient states that he had onset of CP and fatigue on Sunday. Pt states that the pain will get better and then worse. Pt states that he has felt fluttering in his chest. Pt states that he has had SOB with same.  Pt states that pain is in the left side of his chest.  Pt states that he has a history of anxiety with CP but normally CP is in the center of his chest, this is different.  Pt has had Headaches with same. Pt states that he had onset of nausea this afternoon.  Pt denies any vomiting. Pt states that CP is worse with exertion.

## 2011-10-03 ENCOUNTER — Encounter (HOSPITAL_COMMUNITY)
Admission: EM | Disposition: A | Discharge status: Home / Self Care | Payer: Self-pay | Source: Home / Self Care | Attending: Emergency Medicine

## 2011-10-03 ENCOUNTER — Encounter (HOSPITAL_COMMUNITY): Payer: Self-pay

## 2011-10-03 DIAGNOSIS — I2510 Atherosclerotic heart disease of native coronary artery without angina pectoris: Secondary | ICD-10-CM | POA: Diagnosis present

## 2011-10-03 DIAGNOSIS — I739 Peripheral vascular disease, unspecified: Secondary | ICD-10-CM | POA: Diagnosis present

## 2011-10-03 HISTORY — PX: LEFT HEART CATHETERIZATION WITH CORONARY ANGIOGRAM: SHX5451

## 2011-10-03 LAB — LIPID PANEL
Cholesterol: 208 mg/dL — ABNORMAL HIGH (ref 0–200)
HDL: 33 mg/dL — ABNORMAL LOW (ref >39)
LDL Cholesterol: UNDETERMINED mg/dL (ref 0–99)
Triglycerides: 465 mg/dL — ABNORMAL HIGH (ref <150)
VLDL: UNDETERMINED mg/dL (ref 0–40)

## 2011-10-03 LAB — GLUCOSE, CAPILLARY
Glucose-Capillary: 146 mg/dL — ABNORMAL HIGH (ref 70–99)
Glucose-Capillary: 230 mg/dL — ABNORMAL HIGH (ref 70–99)

## 2011-10-03 LAB — CBC
Hemoglobin: 13 g/dL (ref 13.0–17.0)
MCH: 31.9 pg (ref 26.0–34.0)
MCHC: 34.4 g/dL (ref 30.0–36.0)
RDW: 13.2 % (ref 11.5–15.5)

## 2011-10-03 LAB — COMPREHENSIVE METABOLIC PANEL
ALT: 19 U/L (ref 0–53)
AST: 15 U/L (ref 0–37)
Alkaline Phosphatase: 101 U/L (ref 39–117)
CO2: 19 mEq/L (ref 19–32)
Chloride: 105 mEq/L (ref 96–112)
GFR calc non Af Amer: 72 mL/min — ABNORMAL LOW (ref >90)
Glucose, Bld: 122 mg/dL — ABNORMAL HIGH (ref 70–99)
Sodium: 135 mEq/L (ref 135–145)
Total Bilirubin: 0.1 mg/dL — ABNORMAL LOW (ref 0.3–1.2)

## 2011-10-03 LAB — CARDIAC PANEL(CRET KIN+CKTOT+MB+TROPI)
CK, MB: 2.7 ng/mL (ref 0.3–4.0)
CK, MB: 2.4 ng/mL (ref 0.3–4.0)
Relative Index: INVALID (ref 0.0–2.5)
Relative Index: INVALID (ref 0.0–2.5)
Troponin I: <0.3 ng/mL (ref <0.30)
Troponin I: <0.3 ng/mL (ref <0.30)

## 2011-10-03 LAB — TSH: TSH: 3.186 u[IU]/mL (ref 0.350–4.500)

## 2011-10-03 LAB — HEMOGLOBIN A1C: Mean Plasma Glucose: 166 mg/dL — ABNORMAL HIGH (ref <117)

## 2011-10-03 SURGERY — LEFT HEART CATHETERIZATION WITH CORONARY ANGIOGRAM
Anesthesia: LOCAL

## 2011-10-03 MED ORDER — LIDOCAINE HCL (PF) 1 % IJ SOLN
INTRAMUSCULAR | Status: AC
  Filled 2011-10-03: qty 30

## 2011-10-03 MED ORDER — ONDANSETRON HCL 4 MG/2ML IJ SOLN: 4.0000 mg | Freq: Four times a day (QID) | INTRAMUSCULAR | Status: DC | PRN

## 2011-10-03 MED ORDER — GUAIFENESIN ER 600 MG PO TB12
600.0000 mg | ORAL_TABLET | Freq: Two times a day (BID) | ORAL | Status: DC
  Administered 2011-10-03 – 2011-10-04 (×3): 600 mg via ORAL
  Filled 2011-10-03 (×4): qty 1

## 2011-10-03 MED ORDER — SODIUM CHLORIDE 0.9 % IV SOLN: 250.0000 mL | INTRAVENOUS | Status: DC | PRN

## 2011-10-03 MED ORDER — ALPRAZOLAM 0.5 MG PO TABS
0.5000 mg | ORAL_TABLET | Freq: Every day | ORAL | Status: DC | PRN
  Administered 2011-10-04: 0.5 mg via ORAL
  Filled 2011-10-03: qty 1

## 2011-10-03 MED ORDER — HEPARIN (PORCINE) IN NACL 2-0.9 UNIT/ML-% IJ SOLN
INTRAMUSCULAR | Status: AC
  Filled 2011-10-03: qty 2000

## 2011-10-03 MED ORDER — IPRATROPIUM BROMIDE 0.02 % IN SOLN
0.5000 mg | Freq: Four times a day (QID) | RESPIRATORY_TRACT | Status: DC
  Administered 2011-10-03 (×2): 0.5 mg via RESPIRATORY_TRACT
  Filled 2011-10-03: qty 2.5

## 2011-10-03 MED ORDER — SODIUM CHLORIDE 0.9 % IV SOLN
1.0000 mL/kg/h | INTRAVENOUS | Status: AC
  Administered 2011-10-03: 1 mL/kg/h via INTRAVENOUS

## 2011-10-03 MED ORDER — TRAMADOL HCL 50 MG PO TABS
50.0000 mg | ORAL_TABLET | Freq: Four times a day (QID) | ORAL | Status: DC | PRN
  Filled 2011-10-03: qty 1

## 2011-10-03 MED ORDER — NITROGLYCERIN 0.2 MG/HR TD PT24
0.2000 mg | MEDICATED_PATCH | Freq: Every day | TRANSDERMAL | Status: DC
  Filled 2011-10-03: qty 1

## 2011-10-03 MED ORDER — NITROGLYCERIN 0.4 MG SL SUBL: 0.4000 mg | SUBLINGUAL_TABLET | SUBLINGUAL | Status: DC | PRN

## 2011-10-03 MED ORDER — ENOXAPARIN SODIUM 40 MG/0.4ML ~~LOC~~ SOLN
40.0000 mg | SUBCUTANEOUS | Status: DC
  Administered 2011-10-04: 40 mg via SUBCUTANEOUS
  Filled 2011-10-03 (×2): qty 0.4

## 2011-10-03 MED ORDER — ALBUTEROL SULFATE (5 MG/ML) 0.5% IN NEBU: 2.5000 mg | INHALATION_SOLUTION | RESPIRATORY_TRACT | Status: DC | PRN

## 2011-10-03 MED ORDER — SODIUM CHLORIDE 0.9 % IJ SOLN: 3.0000 mL | INTRAMUSCULAR | Status: DC | PRN

## 2011-10-03 MED ORDER — ONDANSETRON HCL 4 MG PO TABS: 4.0000 mg | ORAL_TABLET | Freq: Four times a day (QID) | ORAL | Status: DC | PRN

## 2011-10-03 MED ORDER — SODIUM CHLORIDE 0.9 % IV SOLN
1.0000 mL/kg/h | INTRAVENOUS | Status: DC
  Administered 2011-10-03: 1 mL/kg/h via INTRAVENOUS

## 2011-10-03 MED ORDER — NICOTINE 21 MG/24HR TD PT24
21.0000 mg | MEDICATED_PATCH | Freq: Every day | TRANSDERMAL | Status: DC
  Administered 2011-10-03 – 2011-10-04 (×2): 21 mg via TRANSDERMAL
  Filled 2011-10-03 (×2): qty 1

## 2011-10-03 MED ORDER — SODIUM CHLORIDE 0.9 % IJ SOLN
3.0000 mL | Freq: Two times a day (BID) | INTRAMUSCULAR | Status: DC
  Administered 2011-10-03: 3 mL via INTRAVENOUS

## 2011-10-03 MED ORDER — MIDAZOLAM HCL 2 MG/2ML IJ SOLN
INTRAMUSCULAR | Status: AC
  Filled 2011-10-03: qty 2

## 2011-10-03 MED ORDER — DOXEPIN HCL 50 MG PO CAPS
50.0000 mg | ORAL_CAPSULE | Freq: Every day | ORAL | Status: DC
  Administered 2011-10-03: 50 mg via ORAL
  Filled 2011-10-03 (×2): qty 1

## 2011-10-03 MED ORDER — ASPIRIN 325 MG PO TABS
325.0000 mg | ORAL_TABLET | Freq: Every day | ORAL | Status: DC
  Filled 2011-10-03: qty 1

## 2011-10-03 MED ORDER — NITROGLYCERIN 0.2 MG/ML ON CALL CATH LAB
INTRAVENOUS | Status: AC
  Filled 2011-10-03: qty 1

## 2011-10-03 MED ORDER — ISOSORBIDE MONONITRATE ER 30 MG PO TB24
30.0000 mg | ORAL_TABLET | Freq: Every day | ORAL | Status: DC
  Filled 2011-10-03 (×2): qty 1

## 2011-10-03 MED ORDER — FENTANYL CITRATE 0.05 MG/ML IJ SOLN
INTRAMUSCULAR | Status: AC
  Filled 2011-10-03: qty 2

## 2011-10-03 MED ORDER — ALBUTEROL SULFATE (5 MG/ML) 0.5% IN NEBU
2.5000 mg | INHALATION_SOLUTION | Freq: Four times a day (QID) | RESPIRATORY_TRACT | Status: DC
  Administered 2011-10-03 (×2): 2.5 mg via RESPIRATORY_TRACT
  Filled 2011-10-03 (×2): qty 0.5

## 2011-10-03 MED ORDER — MORPHINE SULFATE 2 MG/ML IJ SOLN
1.0000 mg | INTRAMUSCULAR | Status: DC | PRN
  Administered 2011-10-03: 1 mg via INTRAVENOUS
  Filled 2011-10-03: qty 1

## 2011-10-03 MED ORDER — NITROGLYCERIN IN D5W 200-5 MCG/ML-% IV SOLN
2.0000 ug/min | INTRAVENOUS | Status: DC
  Administered 2011-10-03: 5 ug/min via INTRAVENOUS
  Filled 2011-10-03: qty 250

## 2011-10-03 MED ORDER — ACETAMINOPHEN 325 MG PO TABS: 650.0000 mg | ORAL_TABLET | ORAL | Status: DC | PRN

## 2011-10-03 MED ORDER — ENOXAPARIN SODIUM 40 MG/0.4ML ~~LOC~~ SOLN
40.0000 mg | SUBCUTANEOUS | Status: DC
  Administered 2011-10-03: 40 mg via SUBCUTANEOUS
  Filled 2011-10-03: qty 0.4

## 2011-10-03 MED ORDER — SIMVASTATIN 5 MG PO TABS
5.0000 mg | ORAL_TABLET | Freq: Every day | ORAL | Status: DC
  Administered 2011-10-03: 5 mg via ORAL
  Filled 2011-10-03 (×2): qty 1

## 2011-10-03 MED ORDER — ASPIRIN 81 MG PO CHEW
324.0000 mg | CHEWABLE_TABLET | ORAL | Status: AC
  Administered 2011-10-03: 324 mg via ORAL
  Filled 2011-10-03: qty 4

## 2011-10-03 MED ORDER — PAROXETINE HCL 20 MG PO TABS
20.0000 mg | ORAL_TABLET | ORAL | Status: DC
  Administered 2011-10-03 – 2011-10-04 (×2): 20 mg via ORAL
  Filled 2011-10-03 (×3): qty 1

## 2011-10-03 MED ORDER — ZOLPIDEM TARTRATE 5 MG PO TABS: 10.0000 mg | ORAL_TABLET | Freq: Every evening | ORAL | Status: DC | PRN

## 2011-10-03 MED ORDER — NITROGLYCERIN IN D5W 200-5 MCG/ML-% IV SOLN: 2.0000 ug/min | INTRAVENOUS | Status: DC

## 2011-10-03 MED ORDER — METOPROLOL TARTRATE 25 MG PO TABS
25.0000 mg | ORAL_TABLET | Freq: Two times a day (BID) | ORAL | Status: DC
  Administered 2011-10-03 – 2011-10-04 (×4): 25 mg via ORAL
  Filled 2011-10-03 (×5): qty 1

## 2011-10-03 NOTE — Consult Note (Signed)
Pt is a 1 ppd smoker and says he is thinking about it but he knows he has to. Encouraged pt to quit and reviewed his understanding of the risk factors. Encouraged pt to quit . Referred to 1-800 quit now for f/u and support. Discussed oral fixation substitutes, second hand smoke and in home smoking policy. Reviewed and gave pt Written education/contact information.

## 2011-10-03 NOTE — ED Notes (Signed)
Spoke with Dr. Mikeal Hawthorne and he stated to give pt ordered Morphine for pain. Also to change patients unit to [facility] because of active chest pain and pressure. Will continue to monitor.

## 2011-10-03 NOTE — Progress Notes (Signed)
Patient evaluated. Noted cath results and Dr [facility] plans. Will follow tomorrow.   Calvert Cantor, MD [phone]

## 2011-10-03 NOTE — Op Note (Signed)
[facility]     CARDIAC CATHETERIZATION REPORT  Jason [name]   [id number] 1951/01/25  Performing Cardiologist: Chrystie Nose Primary Physician: Lucilla Edin, MD, MD Primary Cardiologist:  Dr. Royann Shivers  Procedures Performed:  Left Heart Catheterization via 5 Fr left femoral artery access  Left Ventriculography, (RAO/LAO) 13 ml/sec for 26 ml total contrast  Native Coronary Angiography  Internal Mammary Graft Angiography  Saphenous Vein Graft Angiography  Indication(s): Unstable angina  History: 61 y.o. male with a history of CAD s/p CABG x 3 vessels. He last had a cardiac cath in 09/2010 which showed the SVG to RCA was occluded and his SVG-RAMUS/Marginal and LIMA-LAD were patent with good distal runoff. Over the past week, he has been exerting himself more than usual and has had increasing anginal symptoms, also associated with nausea and diaphoresis. Chest pain today was 3/10.  Cardiac enzymes are negative. He is referred for cardiac catheterization.  Consent: The procedure with Risks/Benefits/Alternatives and Indications was reviewed with the patient. All questions were answered.    Risks / Complications include, but not limited to: Death, MI, CVA/TIA, VF/VT (with defibrillation), Bradycardia (need for temporary pacer placement), contrast induced nephropathy, bleeding / bruising / hematoma / pseudoaneurysm, vascular or coronary injury (with possible emergent CT or Vascular Surgery), adverse medication reactions, infection.    The patient voices understanding and agree to proceed.    Risks of procedure as well as the alternatives and risks of each were explained to the (patient/caregiver).  Consent for procedure obtained. Consent for signed by MD and patient with RN witness -- placed on chart.  Procedure: The patient was brought to the 2nd Floor [place] [facility] in the fasting state and prepped and draped in the usual sterile  fashion for (Left groin) access. He has a right femoral bypass graft.  Sterile technique was used including antiseptics, cap, gloves, gown, hand hygiene, mask and sheet.  Skin prep: Chlorhexidine;  Time Out: Verified patient identification, verified procedure, site/side was marked, verified correct patient position, special equipment/implants available, medications/allergies/relevent history reviewed, required imaging and test results available.  Performed  The left femoral head was identified using tactile and fluoroscopic technique.  The left groin was anesthetized with 1% subcutaneous Lidocaine.  The left Common Femoral Artery was accessed using the Modified Seldinger Technique with placement of a antimicrobial bonded/coated single lumen (5 Fr) sheath was placed in the left common femoral artery using the Seldinger technique.  The sheath was aspirated and flushed.  A 5 Fr JL4 Catheter was advanced of over a Standard J wire into the ascending Aorta.  The catheter was used to engage the left coronary artery.  Multiple cineangiographic views of the left coronary artery system(s) were performed. A 5 Fr JR4 Catheter was advanced of over a Safety J wire into the ascending Aorta.  The catheter was used to engage the right coronary artery and SVG to Ramus/Marginal and SVG to PDA.  Multiple cineangiographic views of the right coronary artery system and grafts were performed. This catheter was then exchanged over the standard J wire for a 66F LIMA catheter. A long exchange wire was used to advance the catheter into the left subclavian and the LIMA was engaged. Multiple shots were taken of the LIMA.  This was exchanged for an angled Pigtail catheter that was advanced across the Aortic Valve.  LV hemodynamics were measured and Left Ventriculography was performed.  LV hemodynamics were then re-sampled, and the catheter was pulled  back across the Aortic Valve for measurement of "pull-back" gradient.  The catheter and  the wire was removed completely out of the body.  The patient was transferred to the holding area where the sheath was removed with manual pressure held for hemostasis. The patient had received prophylactic subcutaneous lovenox 40 mg at 9:30 am.  The patient was transported to the cath lab holding area in stable condition.   The patient  was stable before, during and following the procedure.   Patient did tolerate procedure well. There were not complications.  EBL: <10 cc  Medications:  Premedication: none  Sedation:  2 mg IV Versed, 50 IV mcg Fentanyl  Contrast:  75 cc Omnipaque  Hemodynamics:  Central Aortic Pressure / Mean Aortic Pressure: 87/45  LV Pressure / LV End diastolic Pressure:  6  Left Ventriculography:  EF:  50%  Wall Motion: Basal to mid inferior hypokinesis  Coronary Angiographic Data:  Left Main:  There is 30% ostial and 50% distal LM stenosis.   Left Anterior Descending (LAD):  There is severe 80% mid-LAD stenosis prior to the insertion of the LIMA.  There is competitive flow in the distal lAD  1st diagonal (D1):  Small vessel with mild disease.    Circumflex (LCx):  Moderate proximal disease. There is competitive flow seen in a first marginal branch.    Ramus Intermedius:  Proximally occluded. There is distal competitive flow noted from an SVG.  Right Coronary Artery: Proximal stents with moderate ISR. The vessel is totally occluded in the mid-portion.  Grafts  LIMA - LAD: Patent with good distal runoff. Reverse flow seen in the LAD and a diagonal.  SVG - RAMUS/OM: Patent, with TIMI III flow and good distal runoff, supplies the entire lateral wall.  SVG - RCA (RPDA/RPL): Occluded with a small nub at the ostium.  Impression: 1.  3 vessel native CAD - essentially unchanged from prior cath. 2.  Patent LIMA-LAD, SVG-MARGINAL/RAMUS with good distal runoff and TIMI III flow. 3.  Known occluded SVG-RCA. 4.  EF 50% with basal to mid inferior  hypokinesis.  Plan: 1.  Medical therapy for angina.  Will try to uptitrate b-blocker, nitrates or possibly add Ranexa. 2.  Probable d/c home tomorrow.  The case and results was discussed with the patient. The case and results was not discussed with the patient's PCP. The case and results was not discussed with the patient's Cardiologist.  Time Spend Directly with Patient:  45 minutes  Chrystie Nose, MD, [facility] Attending Cardiologist [facility]  Wyndham Santilli C 10/03/2011, 12:21 PM

## 2011-10-03 NOTE — Consult Note (Signed)
Pt. Seen and examined. Agree with the NP/PA-C note as written.  61 yo male, known to me from a prior hospitalization, with prior CABG and occluded graft to the PDA in 09/2010.  Over the past week, he has been doing more strenuous work and developed SSCP rad-> Left arm and neck, with associated nausea and diaphoresis.  He continues to have low grade 3/10 chest pain.  In addition, he continues to smoke .  Enzymes are negative. BNP is <100.  There are no new acute EKG changes. There is a very mild leukocytosis without signs of infection or fever. Symptoms appear to be typical for unstable angina. I discussed cardiac catheterization with him do to continued chest pain and he is agreeable to this approach.  He has been NPO this morning.  Will plan left femoral approach as he had a prior graft placed to the right femoral artery.  Chrystie Nose, MD, [facility] Attending Cardiologist [facility]

## 2011-10-03 NOTE — Progress Notes (Signed)
Utilization Review Completed.Arlis Yale T4/05/2012   

## 2011-10-03 NOTE — ED Notes (Signed)
Stepdown bed requested with Bed Control

## 2011-10-03 NOTE — Consult Note (Signed)
Reason for Consult: chest pain Referring Physician:   [name] is an 61 y.o. male.  HPI: patient is a 61 year old Caucasian male with history of coronary artery disease status post coronary artery bypass grafting February 2000 and. The LIMA to LAD, sequential SVG to ramus and circumflex, SVG to PDA. His last cardiac cath was April 2012 screening at the total occlusion of the saphenous vein graft to right coronary artery as well as total occlusion of the previously stented native right coronary artery. The estimated ejection fraction was 45%. The patient continues to smoke one pack of cigarettes per day. He also has a history of diabetes mellitus, dyslipidemia, anxiety, low back pain.  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 Sunday developed a tightness in his chest which at its worse was 7-8/10 in intensity.   It has radiated to his left arm and left side of his neck. He has had the pain all week with nausea, shortness of breath, diaphoresis, palpitations, headache. She also states it has been extremely fatigued. Currently his pain is 3/10.  He denies lower extremity edema, orthopnea PND, dizziness, abdominal pain.  Cardiac enzymes have been negative so far.  The patient also states this is similar to previous chest pain episodes where he has had heart attack.  Past Medical History  Diagnosis Date  . Myocardial infarct   . Coronary artery disease   . Anxiety   . Hypertension   . Diabetes mellitus   . Arthritis   . GERD (gastroesophageal reflux disease)   . High cholesterol     Past Surgical History  Procedure Date  . Coronary stent placement   . Coronary artery bypass graft   . Femoral artery - femoral artery bypass graft     History reviewed. No pertinent family history.  Social History:  reports that he has been smoking.  He does not have any smokeless tobacco history on file. He reports that he  drinks alcohol. He reports that he does not use illicit drugs.  Allergies:  Allergies  Allergen Reactions  . Sulfa Antibiotics Nausea And Vomiting    Medications:     . albuterol  2.5 mg Nebulization Q6H  . aspirin  325 mg Oral Daily  . doxepin  50 mg Oral QHS  . enoxaparin  40 mg Subcutaneous Q24H  . guaiFENesin  600 mg Oral BID  . ipratropium  0.5 mg Nebulization Q6H  . metoprolol tartrate  25 mg Oral BID  . nicotine  21 mg Transdermal Daily  . nitroGLYCERIN  0.2 mg Transdermal Daily  . PARoxetine  20 mg Oral BH-q7a  . simvastatin  5 mg Oral q1800  . sodium chloride  500 mL Intravenous Once     Results for orders placed during the hospital encounter of 10/02/11 (from the past 48 hour(s))  CBC     Status: Abnormal   Collection Time   10/02/11  9:23 PM      Component Value Range Comment   WBC 11.9 (*) 4.0 - 10.5 (K/uL)    RBC 4.54  4.22 - 5.81 (MIL/uL)    Hemoglobin 14.7  13.0 - 17.0 (g/dL)    HCT 16.1  09.6 - 04.5 (%)    MCV 91.6  78.0 - 100.0 (fL)    MCH 32.4  26.0 - 34.0 (pg)    MCHC 35.3  30.0 - 36.0 (g/dL)    RDW 13.1  11.5 - 15.5 (%)    Platelets 259  150 - 400 (K/uL)   BASIC METABOLIC PANEL     Status: Abnormal   Collection Time   10/02/11  9:23 PM      Component Value Range Comment   Sodium 137  135 - 145 (mEq/L)    Potassium 3.7  3.5 - 5.1 (mEq/L)    Chloride 101  96 - 112 (mEq/L)    CO2 23  19 - 32 (mEq/L)    Glucose, Bld 150 (*) 70 - 99 (mg/dL)    BUN 12  6 - 23 (mg/dL)    Creatinine, Ser 4.09  0.50 - 1.35 (mg/dL)    Calcium 9.7  8.4 - 10.5 (mg/dL)    GFR calc non Af Amer 63 (*) >90 (mL/min)    GFR calc Af Amer 73 (*) >90 (mL/min)   POCT I-STAT TROPONIN I     Status: Normal   Collection Time   10/02/11  9:26 PM      Component Value Range Comment   Troponin i, poc 0.00  0.00 - 0.08 (ng/mL)    Comment 3            CARDIAC PANEL(CRET KIN+CKTOT+MB+TROPI)     Status: Normal   Collection Time   10/02/11  9:50 PM      Component Value Range Comment    Total CK 60  7 - 232 (U/L)    CK, MB 2.5  0.3 - 4.0 (ng/mL)    Troponin I <0.30  <0.30 (ng/mL)    Relative Index RELATIVE INDEX IS INVALID  0.0 - 2.5    PROTIME-INR     Status: Normal   Collection Time   10/02/11  9:50 PM      Component Value Range Comment   Prothrombin Time 12.6  11.6 - 15.2 (seconds)    INR 0.92  0.00 - 1.49    APTT     Status: Normal   Collection Time   10/02/11  9:50 PM      Component Value Range Comment   aPTT 33  24 - 37 (seconds)   LIPID PANEL     Status: Abnormal   Collection Time   10/02/11  9:50 PM      Component Value Range Comment   Cholesterol 208 (*) 0 - 200 (mg/dL)    Triglycerides 811 (*) <150 (mg/dL)    HDL 33 (*) >91 (mg/dL)    Total CHOL/HDL Ratio 6.3      VLDL UNABLE TO CALCULATE IF TRIGLYCERIDE OVER 400 mg/dL  0 - 40 (mg/dL)    LDL Cholesterol UNABLE TO CALCULATE IF TRIGLYCERIDE OVER 400 mg/dL  0 - 99 (mg/dL)   PRO B NATRIURETIC PEPTIDE     Status: Normal   Collection Time   10/03/11  2:28 AM      Component Value Range Comment   Pro B Natriuretic peptide (BNP) 85.8  0 - 125 (pg/mL)   CARDIAC PANEL(CRET KIN+CKTOT+MB+TROPI)     Status: Normal   Collection Time   10/03/11  2:28 AM      Component Value Range Comment   Total CK 48  7 - 232 (U/L)    CK, MB 2.3  0.3 - 4.0 (ng/mL)    Troponin I <0.30  <0.30 (ng/mL)    Relative Index RELATIVE INDEX IS INVALID  0.0 - 2.5    MRSA PCR SCREENING     Status: Normal   Collection Time   10/03/11  2:39  AM      Component Value Range Comment   MRSA by PCR NEGATIVE  NEGATIVE    CBC     Status: Abnormal   Collection Time   10/03/11  2:41 AM      Component Value Range Comment   WBC 11.4 (*) 4.0 - 10.5 (K/uL)    RBC 4.08 (*) 4.22 - 5.81 (MIL/uL)    Hemoglobin 13.0  13.0 - 17.0 (g/dL)    HCT 82.9 (*) 56.2 - 52.0 (%)    MCV 92.6  78.0 - 100.0 (fL)    MCH 31.9  26.0 - 34.0 (pg)    MCHC 34.4  30.0 - 36.0 (g/dL)    RDW 13.0  86.5 - 78.4 (%)    Platelets 213  150 - 400 (K/uL)   COMPREHENSIVE METABOLIC PANEL      Status: Abnormal   Collection Time   10/03/11  2:41 AM      Component Value Range Comment   Sodium 135  135 - 145 (mEq/L)    Potassium 3.8  3.5 - 5.1 (mEq/L)    Chloride 105  96 - 112 (mEq/L)    CO2 19  19 - 32 (mEq/L)    Glucose, Bld 122 (*) 70 - 99 (mg/dL)    BUN 14  6 - 23 (mg/dL)    Creatinine, Ser 6.96  0.50 - 1.35 (mg/dL)    Calcium 9.0  8.4 - 10.5 (mg/dL)    Total Protein 5.8 (*) 6.0 - 8.3 (g/dL)    Albumin 3.2 (*) 3.5 - 5.2 (g/dL)    AST 15  0 - 37 (U/L)    ALT 19  0 - 53 (U/L)    Alkaline Phosphatase 101  39 - 117 (U/L)    Total Bilirubin 0.1 (*) 0.3 - 1.2 (mg/dL)    GFR calc non Af Amer 72 (*) >90 (mL/min)    GFR calc Af Amer 83 (*) >90 (mL/min)     Dg Chest Port 1 View  10/02/2011  *RADIOLOGY REPORT*  Clinical Data: Left-sided chest pain and numbness; left shoulder pain, radiating to the left arm.  History of diabetes and smoking.  PORTABLE CHEST - 1 VIEW  Comparison: Chest radiograph performed 10/14/2010  Findings: The lungs are well-aerated and clear.  There is no evidence of focal opacification, pleural effusion or pneumothorax.  The cardiomediastinal silhouette is normal in size; the patient is status post median sternotomy, with evidence of prior CABG.  No acute osseous abnormalities are seen.  IMPRESSION: No acute cardiopulmonary process seen.  Original Report Authenticated By: Tonia Ghent, M.D.    Review of Systems  Constitutional: Positive for malaise/fatigue and diaphoresis. Negative for fever and chills.  HENT: Positive for neck pain. Negative for congestion and sore throat.   Eyes: Negative for blurred vision and double vision.  Respiratory: Positive for cough and shortness of breath.   Cardiovascular: Positive for chest pain and palpitations. Negative for orthopnea, leg swelling and PND.  Gastrointestinal: Positive for nausea. Negative for vomiting, abdominal pain, diarrhea, constipation, blood in stool and melena.  Genitourinary: Negative for dysuria and  hematuria.  Neurological: Positive for weakness and headaches. Negative for dizziness.   Blood pressure 112/53, pulse 76, temperature 97.4 F (36.3 C), temperature source Oral, resp. rate 20, height 5\' 9"  (1.753 m), weight 73.3 kg (161 lb 9.6 oz), SpO2 96.00%. Physical Exam  Constitutional: He is oriented to person, place, and time. He appears well-developed and well-nourished. No distress.  HENT:  Head: Normocephalic  and atraumatic.  Eyes: EOM are normal. Pupils are equal, round, and reactive to light. No scleral icterus.  Neck: No JVD present.  Cardiovascular: Normal rate, regular rhythm, S1 normal and S2 normal.   No murmur heard. Pulses:      Radial pulses are 2+ on the right side, and 2+ on the left side.       Dorsalis pedis pulses are 2+ on the right side, and 2+ on the left side.       No carotid or femoral bruits  GI: Soft. Bowel sounds are normal. He exhibits no distension. There is no tenderness.  Musculoskeletal: He exhibits no edema.  Lymphadenopathy:    He has no cervical adenopathy.  Neurological: He is alert and oriented to person, place, and time.  Skin: Skin is warm and dry.  Psychiatric: He has a normal mood and affect.    Assessment/Plan: Patient Active Hospital Problem List: Chest pain at rest (10/02/2011) CAD CABG-Feb 2011, LIMA to LAD, sequential SVG to ramus and circumflex, SVG to PDA  Tobacco abuse disorder (10/02/2011) DM (diabetes mellitus) (10/02/2011) HTN (hypertension) (10/02/2011) Hyperlipidemia (10/02/2011)  Plan:  Left heart cath this morning.  Continue to watch cardiac enzymes.    NTG-TD was never placed.  Will add NTG drip and titrate as pain is 3/10 currently.  Check lipids.  Tobacco cessation consult.   Monitor BP  Currently 112/53.  Slightly elevated WBCs  Sadie Hazelett W 10/03/2011, 8:34 AM

## 2011-10-03 NOTE — Progress Notes (Signed)
*  PRELIMINARY RESULTS* Echocardiogram 2D Echocardiogram has been performed.  Jeryl Columbia R 10/03/2011, 10:50 AM

## 2011-10-03 NOTE — H&P (Addendum)
Jason [name] is an 61 y.o. male.   Chief Complaint: Chest pain x2 days HPI: 61 year old gentleman with history of coronary artery disease status post coronary artery bypass graft grafting 2 years ago followed by cardiac cath last year. Patient is here with intermittent chest pain which has been going on for weeks. Usually it is retrosternal and pressure-like but today it was left-sided groin down his left arm. His cath last year showed 80% occlusion of the left main branch at the ostium, 50% LAD stenosis and another 90% mid LAD stenosis as well as almost total occlusion of ramus intermedius. Patient also had other diffuse disease at the time he was mainly on medical management. He continues to smoke cigarettes despite his disease. His chest pain never went away and his last visit to his cardiologist was apparently in November of last year. He is being followed by [facility]. His chest pain is not a 3/10 respiratory to nitroglycerin, no diaphoresis no nausea or vomiting no diarrhea. He did take some aspirin at home as well as a sublingual nitroglycerin.  Past Medical History  Diagnosis Date  . Myocardial infarct   . Coronary artery disease   . Anxiety   . Hypertension   . Diabetes mellitus   . Arthritis   . GERD (gastroesophageal reflux disease)   . High cholesterol     Past Surgical History  Procedure Date  . Coronary stent placement   . Coronary artery bypass graft   . Femoral artery - femoral artery bypass graft     History reviewed. No pertinent family history. Social History:  reports that he has been smoking.  He does not have any smokeless tobacco history on file. He reports that he drinks alcohol. He reports that he does not use illicit drugs.  Allergies:  Allergies  Allergen Reactions  . Sulfa Antibiotics Nausea And Vomiting    Medications Prior to Admission  Medication Dose Route Frequency Provider Last Rate Last Dose  . nitroGLYCERIN (NITROSTAT) SL tablet 0.4 mg  0.4 mg  Sublingual Q5 min PRN Gwyneth Sprout, MD   0.4 mg at 10/02/11 2316  . sodium chloride 0.9 % bolus 500 mL  500 mL Intravenous Once Gwyneth Sprout, MD   500 mL at 10/02/11 2349   No current outpatient prescriptions on file as of 10/02/2011.    Results for orders placed during the hospital encounter of 10/02/11 (from the past 48 hour(s))  CBC     Status: Abnormal   Collection Time   10/02/11  9:23 PM      Component Value Range Comment   WBC 11.9 (*) 4.0 - 10.5 (K/uL)    RBC 4.54  4.22 - 5.81 (MIL/uL)    Hemoglobin 14.7  13.0 - 17.0 (g/dL)    HCT 95.6  21.3 - 08.6 (%)    MCV 91.6  78.0 - 100.0 (fL)    MCH 32.4  26.0 - 34.0 (pg)    MCHC 35.3  30.0 - 36.0 (g/dL)    RDW 57.8  46.9 - 62.9 (%)    Platelets 259  150 - 400 (K/uL)   BASIC METABOLIC PANEL     Status: Abnormal   Collection Time   10/02/11  9:23 PM      Component Value Range Comment   Sodium 137  135 - 145 (mEq/L)    Potassium 3.7  3.5 - 5.1 (mEq/L)    Chloride 101  96 - 112 (mEq/L)    CO2 23  19 -  32 (mEq/L)    Glucose, Bld 150 (*) 70 - 99 (mg/dL)    BUN 12  6 - 23 (mg/dL)    Creatinine, Ser 1.61  0.50 - 1.35 (mg/dL)    Calcium 9.7  8.4 - 10.5 (mg/dL)    GFR calc non Af Amer 63 (*) >90 (mL/min)    GFR calc Af Amer 73 (*) >90 (mL/min)   POCT I-STAT TROPONIN I     Status: Normal   Collection Time   10/02/11  9:26 PM      Component Value Range Comment   Troponin i, poc 0.00  0.00 - 0.08 (ng/mL)    Comment 3            CARDIAC PANEL(CRET KIN+CKTOT+MB+TROPI)     Status: Normal   Collection Time   10/02/11  9:50 PM      Component Value Range Comment   Total CK 60  7 - 232 (U/L)    CK, MB 2.5  0.3 - 4.0 (ng/mL)    Troponin I <0.30  <0.30 (ng/mL)    Relative Index RELATIVE INDEX IS INVALID  0.0 - 2.5    PROTIME-INR     Status: Normal   Collection Time   10/02/11  9:50 PM      Component Value Range Comment   Prothrombin Time 12.6  11.6 - 15.2 (seconds)    INR 0.92  0.00 - 1.49    APTT     Status: Normal   Collection  Time   10/02/11  9:50 PM      Component Value Range Comment   aPTT 33  24 - 37 (seconds)    Dg Chest Port 1 View  10/02/2011  *RADIOLOGY REPORT*  Clinical Data: Left-sided chest pain and numbness; left shoulder pain, radiating to the left arm.  History of diabetes and smoking.  PORTABLE CHEST - 1 VIEW  Comparison: Chest radiograph performed 10/14/2010  Findings: The lungs are well-aerated and clear.  There is no evidence of focal opacification, pleural effusion or pneumothorax.  The cardiomediastinal silhouette is normal in size; the patient is status post median sternotomy, with evidence of prior CABG.  No acute osseous abnormalities are seen.  IMPRESSION: No acute cardiopulmonary process seen.  Original Report Authenticated By: Tonia Ghent, M.D.    Review of Systems  Constitutional: Positive for diaphoresis.  HENT: Negative.   Eyes: Negative.   Respiratory: Positive for cough and shortness of breath. Negative for hemoptysis and wheezing.   Cardiovascular: Negative.   Gastrointestinal: Negative.   Genitourinary: Negative.   Musculoskeletal: Negative.   Skin: Negative.   Neurological: Positive for weakness.  Endo/Heme/Allergies: Negative.   Psychiatric/Behavioral: Negative.     Blood pressure 101/53, pulse 70, temperature 97.5 F (36.4 C), temperature source Oral, resp. rate 18, SpO2 95.00%. Physical Exam  Constitutional: He is oriented to person, place, and time. He appears well-developed and well-nourished.  HENT:  Head: Normocephalic and atraumatic.  Right Ear: External ear normal.  Left Ear: External ear normal.  Nose: Nose normal.  Mouth/Throat: Oropharynx is clear and moist.  Eyes: Conjunctivae and EOM are normal. Pupils are equal, round, and reactive to light.  Neck: Normal range of motion. Neck supple.  Cardiovascular: Normal rate, regular rhythm, normal heart sounds and intact distal pulses.   Respiratory: Effort normal and breath sounds normal.  GI: Soft. Bowel sounds  are normal.  Musculoskeletal: Normal range of motion.  Neurological: He is alert and oriented to person, place, and time. He has  normal reflexes.  Skin: Skin is warm and dry.  Psychiatric: He has a normal mood and affect. His behavior is normal. Judgment and thought content normal.     Assessment/Plan Assessment this is a 61 year old gentleman with known coronary artery disease with known disease on medical management was secondary to occlusion post CABG. Patient is here with recurrent chest pain his EKG only shows well known chronic left bundle branch block. No enzyme changes so far. Plan 1 angina: This is chronic as indicated patient is on medical management mainly. We will admit him start him on his aspirin nitroglycerin patch morphine continue with his beta blockers continuances nitrates as well as Cardiology consult. His last cardiac cath was in March of last year. He probably would not qualify for new cath but will defer that to cardiology. Patient is having ongoing continues chest pain. His history of known coronary disease this is most likely all stable angina he is his blood pressure is not tolerating the nitroglycerin so I would not put him on a nitro drip. Start IV heparin as well as morphine as tolerated and put the patient in a step down unit until he is seen by cardiology. #2 diabetes: Will hold metformin and continue with insulin check hemoglobin A1c and put him on diabetic diet. #3 tobacco abuse: Patient has been repeatedly counseled on smoking. He knows he needs to quit. #4 hyperlipidemia: Continue with his statin and check fasting lipid panel #5 hypertension: Blood pressure is controlled now we will continue his home medications and adjust as necessary  Pedro Whiters,LAWAL 10/03/2011, 12:04 AM

## 2011-10-03 NOTE — Discharge Instructions (Signed)
Angiography Care After Please read the instructions outlined below and refer to this sheet in the next few weeks. These discharge instructions provide you with general information on caring for yourself after you leave the hospital. Your cardiologist may also give you specific instructions. While your treatment has been planned according to the most current medical practices available, unavoidable complications occasionally occur.  HOME CARE INSTRUCTIONS   Take all medicines exactly as directed. You may need to keep taking blood thinners if they were prescribed for you.   Limit your activity for the first 48 hours. Avoid bending the place where the catheter was inserted.   Follow instructions about when you can drive or return to work.  SEEK IMMEDIATE MEDICAL CARE IF:   You develop chest pain, shortness of breath, feel faint, or pass out.   You have bleeding, swelling, or drainage from the catheter insertion site.   You develop pain, discoloration, coldness, or severe bruising in the leg or arm that held the catheter.   You develop bleeding from any other place such as the urine or bowels. This may be bright red blood in the urine or stools, or it may appear as black, tarry stools.   You have a fever.  Document Released: 12/26/2004 Document Revised: 05/29/2011 Document Reviewed: 03/15/2008 [facility] Patient Information 2012 [place], [place].

## 2011-10-04 LAB — GLUCOSE, CAPILLARY: Glucose-Capillary: 236 mg/dL — ABNORMAL HIGH (ref 70–99)

## 2011-10-04 MED ORDER — IPRATROPIUM BROMIDE 0.02 % IN SOLN: 0.5000 mg | Freq: Four times a day (QID) | RESPIRATORY_TRACT | Status: DC | PRN

## 2011-10-04 MED ORDER — ASPIRIN 81 MG PO CHEW: 81.0000 mg | CHEWABLE_TABLET | Freq: Every day | ORAL | Status: DC

## 2011-10-04 MED ORDER — ALBUTEROL SULFATE (5 MG/ML) 0.5% IN NEBU: 2.5000 mg | INHALATION_SOLUTION | Freq: Four times a day (QID) | RESPIRATORY_TRACT | Status: DC | PRN

## 2011-10-04 MED ORDER — CLOPIDOGREL BISULFATE 75 MG PO TABS
75.0000 mg | ORAL_TABLET | Freq: Every day | ORAL | Status: DC
  Administered 2011-10-04: 75 mg via ORAL
  Filled 2011-10-04: qty 1

## 2011-10-04 MED ORDER — ISOSORBIDE MONONITRATE ER 60 MG PO TB24
60.0000 mg | ORAL_TABLET | Freq: Every day | ORAL | Status: DC
  Filled 2011-10-04: qty 1

## 2011-10-04 MED ORDER — ISOSORBIDE MONONITRATE ER 30 MG PO TB24
30.0000 mg | ORAL_TABLET | Freq: Every day | ORAL | Status: DC
  Administered 2011-10-04: 30 mg via ORAL
  Filled 2011-10-04: qty 1

## 2011-10-04 MED ORDER — ASPIRIN 81 MG PO CHEW
81.0000 mg | CHEWABLE_TABLET | Freq: Every day | ORAL | Status: DC
  Administered 2011-10-04: 81 mg via ORAL
  Filled 2011-10-04: qty 1

## 2011-10-04 MED ORDER — METFORMIN HCL 500 MG PO TABS: 500.0000 mg | ORAL_TABLET | Freq: Two times a day (BID) | ORAL | Status: DC

## 2011-10-04 MED ORDER — ISOSORBIDE MONONITRATE ER 60 MG PO TB24: 30.0000 mg | ORAL_TABLET | Freq: Every day | ORAL | Status: DC

## 2011-10-04 MED ORDER — LISINOPRIL 5 MG PO TABS
5.0000 mg | ORAL_TABLET | Freq: Every day | ORAL | Status: DC
  Filled 2011-10-04: qty 1

## 2011-10-04 MED ORDER — CLOPIDOGREL BISULFATE 75 MG PO TABS: 75.0000 mg | ORAL_TABLET | Freq: Every day | ORAL | Status: DC

## 2011-10-04 MED ORDER — ISOSORBIDE MONONITRATE ER 60 MG PO TB24: 60.0000 mg | ORAL_TABLET | Freq: Every day | ORAL | Status: DC

## 2011-10-04 MED ORDER — LISINOPRIL 5 MG PO TABS: 5.0000 mg | ORAL_TABLET | Freq: Every day | ORAL | Status: DC

## 2011-10-04 MED ORDER — ISOSORBIDE MONONITRATE ER 30 MG PO TB24: 30.0000 mg | ORAL_TABLET | Freq: Every day | ORAL | Status: DC

## 2011-10-04 MED ORDER — NITROGLYCERIN 0.4 MG SL SUBL: 0.4000 mg | SUBLINGUAL_TABLET | SUBLINGUAL | Status: DC | PRN

## 2011-10-04 MED ORDER — ACETAMINOPHEN 325 MG PO TABS: 650.0000 mg | ORAL_TABLET | ORAL | Status: DC | PRN

## 2011-10-04 NOTE — Progress Notes (Signed)
[facility]  DAILY PROGRESS NOTE   Subjective:  No events overnight. Groin is without hematoma.  Objective:  Temp:  [97.4 F (36.3 C)-98.1 F (36.7 C)] 97.8 F (36.6 C) (04/13 0400) Pulse Rate:  [54-76] 67  (04/13 0400) Resp:  [18-22] 18  (04/13 0400) BP: (105-132)/(60-72) 105/60 mmHg (04/13 0400) SpO2:  [93 %-97 %] 93 % (04/13 0400) Weight change:   Intake/Output from previous day: 04/12 0701 - 04/13 0700 In: 549.5 [P.O.:465; I.V.:1.5; IV Piggyback:83] Out: 1150 [Urine:1150]  Intake/Output from this shift:    Medications: Current Facility-Administered Medications  Medication Dose Route Frequency Provider Last Rate Last Dose  . 0.9 %  sodium chloride infusion  1 mL/kg/hr Intravenous Continuous Chrystie Nose, MD 73.3 mL/hr at 10/03/11 1400 1 mL/kg/hr at 10/03/11 1400  . acetaminophen (TYLENOL) tablet 650 mg  650 mg Oral Q4H PRN Chrystie Nose, MD      . albuterol (PROVENTIL) (5 MG/ML) 0.5% nebulizer solution 2.5 mg  2.5 mg Nebulization Q6H PRN Calvert Cantor, MD      . ALPRAZolam Prudy Feeler) tablet 0.5 mg  0.5 mg Oral Daily PRN Rometta Emery, MD   0.5 mg at 10/04/11 0120  . aspirin chewable tablet 324 mg  324 mg Oral Pre-Cath Kelle Darting [place], [place]   324 mg at 10/03/11 0932  . aspirin chewable tablet 81 mg  81 mg Oral Daily Eda Paschal [place], [place]      . clopidogrel (PLAVIX) tablet 75 mg  75 mg Oral Q breakfast Eda Paschal [place], [place]      . doxepin (SINEQUAN) capsule 50 mg  50 mg Oral QHS Rometta Emery, MD   50 mg at 10/03/11 2232  . enoxaparin (LOVENOX) injection 40 mg  40 mg Subcutaneous Q24H Chrystie Nose, MD   40 mg at 10/04/11 1610  . fentaNYL (SUBLIMAZE) 0.05 MG/ML injection           . guaiFENesin (MUCINEX) 12 hr tablet 600 mg  600 mg Oral BID Rometta Emery, MD   600 mg at 10/03/11 2232  . heparin 2-0.9 UNIT/ML-% infusion           . ipratropium (ATROVENT) nebulizer solution 0.5 mg  0.5 mg Nebulization Q6H PRN Calvert Cantor, MD      . isosorbide  mononitrate (IMDUR) 24 hr tablet 60 mg  60 mg Oral Daily [employer], [place]      . lidocaine (XYLOCAINE) 1 % injection           . metoprolol tartrate (LOPRESSOR) tablet 25 mg  25 mg Oral BID Rometta Emery, MD   25 mg at 10/04/11 0055  . midazolam (VERSED) 2 MG/2ML injection           . morphine 2 MG/ML injection 1 mg  1 mg Intravenous Q4H PRN Rometta Emery, MD   1 mg at 10/03/11 0116  . nicotine (NICODERM CQ - dosed in mg/24 hours) patch 21 mg  21 mg Transdermal Daily Rometta Emery, MD   21 mg at 10/03/11 0941  . nitroGLYCERIN (NITROSTAT) SL tablet 0.4 mg  0.4 mg Sublingual Q5 Min x 3 PRN Rometta Emery, MD      . nitroGLYCERIN (NTG ON-CALL) 0.2 mg/mL injection           . nitroGLYCERIN 0.2 mg/mL in dextrose 5 % infusion  2-200 mcg/min Intravenous Titrated Dwana Melena, PA   5 mcg/min at 10/03/11 0940  .  ondansetron (ZOFRAN) injection 4 mg  4 mg Intravenous Q6H PRN Chrystie Nose, MD      . ondansetron [facility]) tablet 4 mg  4 mg Oral Q6H PRN Rometta Emery, MD      . PARoxetine (PAXIL) tablet 20 mg  20 mg Oral BH-q7a Rometta Emery, MD   20 mg at 10/04/11 0641  . simvastatin (ZOCOR) tablet 5 mg  5 mg Oral q1800 Rometta Emery, MD   5 mg at 10/03/11 1906  . traMADol (ULTRAM) tablet 50 mg  50 mg Oral Q6H PRN Rometta Emery, MD      . zolpidem (AMBIEN) tablet 10 mg  10 mg Oral QHS PRN Abelino Derrick, PA      . DISCONTD: 0.9 %  sodium chloride infusion  250 mL Intravenous PRN Dwana Melena, PA      . DISCONTD: 0.9 %  sodium chloride infusion  1 mL/kg/hr Intravenous Continuous Dwana Melena, PA 73.3 mL/hr at 10/03/11 1133 1 mL/kg/hr at 10/03/11 1133  . DISCONTD: albuterol (PROVENTIL) (5 MG/ML) 0.5% nebulizer solution 2.5 mg  2.5 mg Nebulization Q6H Rometta Emery, MD   2.5 mg at 10/03/11 2058  . DISCONTD: albuterol (PROVENTIL) (5 MG/ML) 0.5% nebulizer solution 2.5 mg  2.5 mg Nebulization Q2H PRN Rometta Emery, MD      . DISCONTD: aspirin tablet 325 mg  325 mg Oral Daily Dwana Melena, PA      . DISCONTD: enoxaparin (LOVENOX) injection 40 mg  40 mg Subcutaneous Q24H Rometta Emery, MD   40 mg at 10/03/11 0933  . DISCONTD: ipratropium (ATROVENT) nebulizer solution 0.5 mg  0.5 mg Nebulization Q6H Rometta Emery, MD   0.5 mg at 10/03/11 2058  . DISCONTD: isosorbide mononitrate (IMDUR) 24 hr tablet 30 mg  30 mg Oral Daily Chrystie Nose, MD      . DISCONTD: nitroGLYCERIN (NITRODUR - Dosed in mg/24 hr) patch 0.2 mg  0.2 mg Transdermal Daily Rometta Emery, MD      . DISCONTD: nitroGLYCERIN (NITROSTAT) SL tablet 0.4 mg  0.4 mg Sublingual Q5 min PRN Gwyneth Sprout, MD   0.4 mg at 10/02/11 2316  . DISCONTD: nitroGLYCERIN 0.2 mg/mL in dextrose 5 % infusion  2-200 mcg/min Intravenous Titrated Dwana Melena, PA      . DISCONTD: ondansetron [facility]) injection 4 mg  4 mg Intravenous Q6H PRN Rometta Emery, MD      . DISCONTD: sodium chloride 0.9 % injection 3 mL  3 mL Intravenous Q12H Dwana Melena, PA   3 mL at 10/03/11 1532  . DISCONTD: sodium chloride 0.9 % injection 3 mL  3 mL Intravenous PRN Dwana Melena, PA        Physical Exam: General appearance: alert and no distress Neck: no adenopathy, no carotid bruit, no JVD, supple, symmetrical, trachea midline and thyroid not enlarged, symmetric, no tenderness/mass/nodules Lungs: clear to auscultation bilaterally Heart: regular rate and rhythm, S1, S2 normal, no murmur, click, rub or gallop Abdomen: soft, non-tender; bowel sounds normal; no masses,  no organomegaly Extremities: extremities normal, atraumatic, no cyanosis or edema  Lab Results: Results for orders placed during the hospital encounter of 10/02/11 (from the past 48 hour(s))  CBC     Status: Abnormal   Collection Time   10/02/11  9:23 PM      Component Value Range Comment   WBC 11.9 (*) 4.0 - 10.5 (K/uL)    RBC 4.54  4.22 - 5.81 (MIL/uL)    Hemoglobin 14.7  13.0 - 17.0 (g/dL)    HCT 11.9  14.7 - 82.9 (%)    MCV 91.6  78.0 - 100.0 (fL)    MCH 32.4   26.0 - 34.0 (pg)    MCHC 35.3  30.0 - 36.0 (g/dL)    RDW 56.2  13.0 - 86.5 (%)    Platelets 259  150 - 400 (K/uL)   BASIC METABOLIC PANEL     Status: Abnormal   Collection Time   10/02/11  9:23 PM      Component Value Range Comment   Sodium 137  135 - 145 (mEq/L)    Potassium 3.7  3.5 - 5.1 (mEq/L)    Chloride 101  96 - 112 (mEq/L)    CO2 23  19 - 32 (mEq/L)    Glucose, Bld 150 (*) 70 - 99 (mg/dL)    BUN 12  6 - 23 (mg/dL)    Creatinine, Ser 7.84  0.50 - 1.35 (mg/dL)    Calcium 9.7  8.4 - 10.5 (mg/dL)    GFR calc non Af Amer 63 (*) >90 (mL/min)    GFR calc Af Amer 73 (*) >90 (mL/min)   POCT I-STAT TROPONIN I     Status: Normal   Collection Time   10/02/11  9:26 PM      Component Value Range Comment   Troponin i, poc 0.00  0.00 - 0.08 (ng/mL)    Comment 3            CARDIAC PANEL(CRET KIN+CKTOT+MB+TROPI)     Status: Normal   Collection Time   10/02/11  9:50 PM      Component Value Range Comment   Total CK 60  7 - 232 (U/L)    CK, MB 2.5  0.3 - 4.0 (ng/mL)    Troponin I <0.30  <0.30 (ng/mL)    Relative Index RELATIVE INDEX IS INVALID  0.0 - 2.5    PROTIME-INR     Status: Normal   Collection Time   10/02/11  9:50 PM      Component Value Range Comment   Prothrombin Time 12.6  11.6 - 15.2 (seconds)    INR 0.92  0.00 - 1.49    APTT     Status: Normal   Collection Time   10/02/11  9:50 PM      Component Value Range Comment   aPTT 33  24 - 37 (seconds)   LIPID PANEL     Status: Abnormal   Collection Time   10/02/11  9:50 PM      Component Value Range Comment   Cholesterol 208 (*) 0 - 200 (mg/dL)    Triglycerides 696 (*) <150 (mg/dL)    HDL 33 (*) >29 (mg/dL)    Total CHOL/HDL Ratio 6.3      VLDL UNABLE TO CALCULATE IF TRIGLYCERIDE OVER 400 mg/dL  0 - 40 (mg/dL)    LDL Cholesterol UNABLE TO CALCULATE IF TRIGLYCERIDE OVER 400 mg/dL  0 - 99 (mg/dL)   PRO B NATRIURETIC PEPTIDE     Status: Normal   Collection Time   10/03/11  2:28 AM      Component Value Range Comment   Pro B  Natriuretic peptide (BNP) 85.8  0 - 125 (pg/mL)   CARDIAC PANEL(CRET KIN+CKTOT+MB+TROPI)     Status: Normal   Collection Time   10/03/11  2:28 AM      Component Value Range Comment   Total  CK 48  7 - 232 (U/L)    CK, MB 2.3  0.3 - 4.0 (ng/mL)    Troponin I <0.30  <0.30 (ng/mL)    Relative Index RELATIVE INDEX IS INVALID  0.0 - 2.5    MRSA PCR SCREENING     Status: Normal   Collection Time   10/03/11  2:39 AM      Component Value Range Comment   MRSA by PCR NEGATIVE  NEGATIVE    CBC     Status: Abnormal   Collection Time   10/03/11  2:41 AM      Component Value Range Comment   WBC 11.4 (*) 4.0 - 10.5 (K/uL)    RBC 4.08 (*) 4.22 - 5.81 (MIL/uL)    Hemoglobin 13.0  13.0 - 17.0 (g/dL)    HCT 40.9 (*) 81.1 - 52.0 (%)    MCV 92.6  78.0 - 100.0 (fL)    MCH 31.9  26.0 - 34.0 (pg)    MCHC 34.4  30.0 - 36.0 (g/dL)    RDW 91.4  78.2 - 95.6 (%)    Platelets 213  150 - 400 (K/uL)   TSH     Status: Normal   Collection Time   10/03/11  2:41 AM      Component Value Range Comment   TSH 3.186  0.350 - 4.500 (uIU/mL)   HEMOGLOBIN A1C     Status: Abnormal   Collection Time   10/03/11  2:41 AM      Component Value Range Comment   Hemoglobin A1C 7.4 (*) <5.7 (%)    Mean Plasma Glucose 166 (*) <117 (mg/dL)   COMPREHENSIVE METABOLIC PANEL     Status: Abnormal   Collection Time   10/03/11  2:41 AM      Component Value Range Comment   Sodium 135  135 - 145 (mEq/L)    Potassium 3.8  3.5 - 5.1 (mEq/L)    Chloride 105  96 - 112 (mEq/L)    CO2 19  19 - 32 (mEq/L)    Glucose, Bld 122 (*) 70 - 99 (mg/dL)    BUN 14  6 - 23 (mg/dL)    Creatinine, Ser 2.13  0.50 - 1.35 (mg/dL)    Calcium 9.0  8.4 - 10.5 (mg/dL)    Total Protein 5.8 (*) 6.0 - 8.3 (g/dL)    Albumin 3.2 (*) 3.5 - 5.2 (g/dL)    AST 15  0 - 37 (U/L)    ALT 19  0 - 53 (U/L)    Alkaline Phosphatase 101  39 - 117 (U/L)    Total Bilirubin 0.1 (*) 0.3 - 1.2 (mg/dL)    GFR calc non Af Amer 72 (*) >90 (mL/min)    GFR calc Af Amer 83 (*) >90  (mL/min)   GLUCOSE, CAPILLARY     Status: Abnormal   Collection Time   10/03/11  8:31 AM      Component Value Range Comment   Glucose-Capillary 147 (*) 70 - 99 (mg/dL)   CARDIAC PANEL(CRET KIN+CKTOT+MB+TROPI)     Status: Normal   Collection Time   10/03/11  9:20 AM      Component Value Range Comment   Total CK 58  7 - 232 (U/L)    CK, MB 2.7  0.3 - 4.0 (ng/mL)    Troponin I <0.30  <0.30 (ng/mL)    Relative Index RELATIVE INDEX IS INVALID  0.0 - 2.5    GLUCOSE, CAPILLARY     Status:  Abnormal   Collection Time   10/03/11 12:34 PM      Component Value Range Comment   Glucose-Capillary 142 (*) 70 - 99 (mg/dL)   GLUCOSE, CAPILLARY     Status: Abnormal   Collection Time   10/03/11  1:21 PM      Component Value Range Comment   Glucose-Capillary 146 (*) 70 - 99 (mg/dL)   GLUCOSE, CAPILLARY     Status: Abnormal   Collection Time   10/03/11  1:42 PM      Component Value Range Comment   Glucose-Capillary 230 (*) 70 - 99 (mg/dL)   GLUCOSE, CAPILLARY     Status: Abnormal   Collection Time   10/03/11  6:12 PM      Component Value Range Comment   Glucose-Capillary 188 (*) 70 - 99 (mg/dL)   CARDIAC PANEL(CRET KIN+CKTOT+MB+TROPI)     Status: Normal   Collection Time   10/03/11  6:40 PM      Component Value Range Comment   Total CK 51  7 - 232 (U/L)    CK, MB 2.4  0.3 - 4.0 (ng/mL)    Troponin I <0.30  <0.30 (ng/mL)    Relative Index RELATIVE INDEX IS INVALID  0.0 - 2.5      Imaging: Dg Chest Port 1 View  10/02/2011  *RADIOLOGY REPORT*  Clinical Data: Left-sided chest pain and numbness; left shoulder pain, radiating to the left arm.  History of diabetes and smoking.  PORTABLE CHEST - 1 VIEW  Comparison: Chest radiograph performed 10/14/2010  Findings: The lungs are well-aerated and clear.  There is no evidence of focal opacification, pleural effusion or pneumothorax.  The cardiomediastinal silhouette is normal in size; the patient is status post median sternotomy, with evidence of prior CABG.   No acute osseous abnormalities are seen.  IMPRESSION: No acute cardiopulmonary process seen.  Original Report Authenticated By: Tonia Ghent, M.D.    Assessment:  1. Principal Problem: 2.  *Chest pain at rest 3. Active Problems: 4.  Tobacco abuse disorder 5.  DM (diabetes mellitus) 6.  HTN (hypertension) 7.  Hyperlipidemia 8.  CAD, CABG Feb 2011, cath 10/03/11-medical Rx 9.  PVD, hx Rt femoral endarterectomy 2004 10.   Plan:  1. Chest pain free today. Will plan medical therapy for angina. Home on ASA 81 mg, Plavix 75 mg, Imdur 30 mg, lopressor 25 mg BID, lisinopril 5 mg daily and pravachol 40 mg daily.   Time Spent Directly with Patient:  15 minutes  Length of Stay:  LOS: 2 days   Chrystie Nose, MD, [facility] Attending Cardiologist [facility]  Altheria Shadoan C 10/04/2011, 8:26 AM

## 2011-10-06 NOTE — Discharge Summary (Signed)
Patient ID: Jason [name],  MRN: [id number], DOB/AGE: [birth year] 59 y.o.  Admit date: 10/02/2011 Discharge date: 10/06/2011  Primary Care Provider: Dr Cleta Alberts Primary Cardiologist: Dr Royann Shivers  Discharge Diagnoses Principal Problem:  *Chest pain at rest Active Problems:  DM (diabetes mellitus)  HTN (hypertension)  CAD, CABG Feb 2011, cath 10/03/11-medical Rx  Tobacco abuse disorder  Hyperlipidemia  PVD, hx Rt femoral endarterectomy 2004    Procedures: Cath 10/03/11   Hospital Course 61 y.o. male with a history of CAD s/p CABG x 3 vessels. He last had a cardiac cath in 09/2010 which showed the SVG to RCA was occluded and his SVG-RAMUS/Marginal and LIMA-LAD were patent with good distal runoff. Over the past week, he has been exerting himself more than usual and has had increasing anginal symptoms, also associated with nausea and diaphoresis. Chest pain on admission was 3/10. Cardiac enzymes are negative. He is referred for cardiac catheterization. Cath by Dr Rennis Golden showed-1. 3 vessel native CAD - essentially unchanged from prior cath.  2. Patent LIMA-LAD, SVG-MARGINAL/RAMUS with good distal runoff and TIMI III flow.  3. Known occluded SVG-RCA.  4. EF 50% with basal to mid inferior hypokinesis. Plan is for continued medical RX. He was stable for discharge 10/03/11. He has been instructed to hold Metformin for 48hrs.   Discharge Vitals:  Blood pressure 116/71, pulse 69, temperature 97.5 F (36.4 C), temperature source Oral, resp. rate 20, height 5\' 9"  (1.753 m), weight 73.3 kg (161 lb 9.6 oz), SpO2 94.00%.    Labs: No results found for this or any previous visit (from the past 48 hour(s)).  Disposition:  Follow-up Information    Follow up with [name], STEVE A, MD. Call in 2 weeks.      Follow up with Dwana Melena, PA. (office will call)    Contact information:   [address], [address] [place] [address] [phone]          Discharge Medications:  Medication List   As of 10/06/2011  9:48 AM   STOP taking these medications         aspirin 325 MG tablet         TAKE these medications         acetaminophen 325 MG tablet   Commonly known as: TYLENOL   Take 2 tablets (650 mg total) by mouth every 4 (four) hours as needed.      ALPRAZolam 0.5 MG tablet   Commonly known as: XANAX   Take 0.5 mg by mouth daily as needed. For anxiety.      aspirin 81 MG chewable tablet   Chew 1 tablet (81 mg total) by mouth daily.      clopidogrel 75 MG tablet   Commonly known as: PLAVIX   Take 1 tablet (75 mg total) by mouth daily with breakfast.      doxepin 25 MG capsule   Commonly known as: SINEQUAN   Take 50 mg by mouth at bedtime.      isosorbide mononitrate 30 MG 24 hr tablet   Commonly known as: IMDUR   Take 1 tablet (30 mg total) by mouth daily.      lisinopril 5 MG tablet   Commonly known as: PRINIVIL,ZESTRIL   Take 1 tablet (5 mg total) by mouth daily.      metFORMIN 500 MG tablet   Commonly known as: GLUCOPHAGE   Take 1 tablet (500 mg total) by mouth 2 (two) times daily with a meal.  metoprolol tartrate 25 MG tablet   Commonly known as: LOPRESSOR   Take 25 mg by mouth 2 (two) times daily.      nitroGLYCERIN 0.4 MG SL tablet   Commonly known as: NITROSTAT   Place 1 tablet (0.4 mg total) under the tongue every 5 (five) minutes x 3 doses as needed. For chest pain.      PARoxetine 20 MG tablet   Commonly known as: PAXIL   Take 20 mg by mouth every morning.      pravastatin 40 MG tablet   Commonly known as: PRAVACHOL   Take 40 mg by mouth daily.      traMADol 50 MG tablet   Commonly known as: ULTRAM   Take 50 mg by mouth every 6 (six) hours as needed. For pain.            Outstanding Labs/Studies  Duration of Discharge Encounter: Greater than 30 minutes including physician time.  Jolene Provost PA-C 10/06/2011 9:48 AM

## 2012-01-16 ENCOUNTER — Encounter: Payer: Self-pay | Admitting: Emergency Medicine

## 2012-02-10 ENCOUNTER — Encounter: Payer: Self-pay | Admitting: Physician Assistant

## 2012-02-10 DIAGNOSIS — M5410 Radiculopathy, site unspecified: Secondary | ICD-10-CM | POA: Insufficient documentation

## 2012-07-01 ENCOUNTER — Other Ambulatory Visit: Payer: Self-pay | Admitting: Neurosurgery

## 2012-07-09 ENCOUNTER — Encounter (HOSPITAL_COMMUNITY): Payer: Self-pay

## 2012-07-09 ENCOUNTER — Ambulatory Visit (HOSPITAL_COMMUNITY)
Admission: RE | Admit: 2012-07-09 | Discharge: 2012-07-09 | Disposition: A | Discharge status: Home / Self Care | Payer: Self-pay | Source: Ambulatory Visit | Attending: Anesthesiology | Admitting: Anesthesiology

## 2012-07-09 ENCOUNTER — Encounter (HOSPITAL_COMMUNITY)
Admission: RE | Admit: 2012-07-09 | Discharge: 2012-07-09 | Disposition: A | Discharge status: Home / Self Care | Payer: Worker's Compensation | Source: Ambulatory Visit | Attending: Neurosurgery | Admitting: Neurosurgery

## 2012-07-09 DIAGNOSIS — Z01818 Encounter for other preprocedural examination: Secondary | ICD-10-CM | POA: Insufficient documentation

## 2012-07-09 DIAGNOSIS — Z951 Presence of aortocoronary bypass graft: Secondary | ICD-10-CM | POA: Insufficient documentation

## 2012-07-09 HISTORY — DX: Pneumonia, unspecified organism: J18.9

## 2012-07-09 HISTORY — DX: Chronic obstructive pulmonary disease, unspecified: J44.9

## 2012-07-09 HISTORY — DX: Malignant (primary) neoplasm, unspecified: C80.1

## 2012-07-09 LAB — CBC
Hemoglobin: 14.1 g/dL (ref 13.0–17.0)
MCH: 32.1 pg (ref 26.0–34.0)
MCHC: 34.5 g/dL (ref 30.0–36.0)
MCV: 93.2 fL (ref 78.0–100.0)
Platelets: 219 10*3/uL (ref 150–400)

## 2012-07-09 LAB — TYPE AND SCREEN: ABO/RH(D): O POS

## 2012-07-09 LAB — BASIC METABOLIC PANEL
BUN: 16 mg/dL (ref 6–23)
CO2: 24 mEq/L (ref 19–32)
Calcium: 9.7 mg/dL (ref 8.4–10.5)
GFR calc non Af Amer: 70 mL/min — ABNORMAL LOW (ref >90)
Glucose, Bld: 148 mg/dL — ABNORMAL HIGH (ref 70–99)

## 2012-07-09 NOTE — Progress Notes (Addendum)
Cath 4/13, note 4/13 dr hilty echo 4/13 card clearance on chart req'd notes ,any tests, ekg from recent visit last week

## 2012-07-09 NOTE — Pre-Procedure Instructions (Addendum)
TASHON CAPP  07/09/2012   Your procedure is scheduled on:  07/16/12  Report to [id number] AM.  Call this number if you have problems the morning of surgery: [phone]   Remember:   Do not eat food or drink liquids after midnight.   Take these medicines the morning of surgery with A SIP OF WATER: imdur, metoprolol, oxycodone STOP aspirin, plavix per dr   Drucilla Schmidt not wear jewelry, make-up or nail polish.  Do not wear lotions, powders, or perfumes. You may not  wear deodorant.  Do not shave 48 hours prior to surgery. Men may shave face and neck.  Do not bring valuables to the hospital.  Contacts, dentures or bridgework may not be worn into surgery.  Leave suitcase in the car. After surgery it may be brought to your room.  For patients admitted to the hospital, checkout time is 11:00 AM the day of  discharge.   Patients discharged the day of surgery will not be allowed to drive  home.  Name and phone number of your driver: howard brother  Special Instructions: Shower using CHG 2 nights before surgery and the night before surgery.  If you shower the day of surgery use CHG.  Use special wash - you have one bottle of CHG for all showers.  You should use approximately 1/3 of the bottle for each shower.   Please read over the following fact sheets that you were given: Pain Booklet, Coughing and Deep Breathing, Blood Transfusion Information, MRSA Information and Surgical Site Infection Prevention

## 2012-07-12 ENCOUNTER — Encounter (HOSPITAL_COMMUNITY): Payer: Self-pay | Admitting: Pharmacy Technician

## 2012-07-12 NOTE — Progress Notes (Signed)
Nurse called [facility] and Vascular and staff member in medical records informed Nurse that they were closed half of the day on Friday, however it was confirmed that they received the request for medical records and it would be faxed back to [place].

## 2012-07-14 NOTE — Progress Notes (Signed)
Received fax(copies of last OV note, EKG, & ECHO report) from [facility] and [facility].  Placed in physical chart.

## 2012-07-15 MED ORDER — CEFAZOLIN SODIUM-DEXTROSE 2-3 GM-% IV SOLR
2.0000 g | INTRAVENOUS | Status: AC
  Administered 2012-07-16 (×2): 2 g via INTRAVENOUS
  Filled 2012-07-15: qty 50

## 2012-07-15 NOTE — H&P (Signed)
Jason [name]  #[id number]  DOB:  1950/09/21    06/09/2012:  Jason [name] returns today having not been doing very well.  He has gone through pain management including injections.  He now comes back with a repeat MRI of his lumbar spine. This shows that the structural abnormalities of his lumbar spine have progressed and are now causing him problems and he has foraminal compression of the L4 nerve root within the neural foramen on the left associated with his lumbar spondylolisthesis with retrolisthesis of L4 on L5 and anterolisthesis of L5 on S1.  He has not had a recurrence of the soft disc material, but he has significant nerve root compression in the foramen.  To clarify the imaging findings, he has L4 nerve root compression on the left in the foramen and because of his significant spondylolisthesis of L5 on S1, he has right-sided nerve root compression at the L5 nerve root.  He has had no recurrence of left leg pain.  He has severe right leg pain.    I explained to Jason [name] that his problem has been an ongoing one.  We attempted to do a smaller surgical repair by taking out the ruptured disc at the L4-5 level on the left and, indeed, this seems to have given him some relief of his left leg pain, but his severe right leg pain is related to the structural pathology within his lumbar spine.  This is all part of his original injury and needs to be addressed now because he is not getting any relief despite conservative measures.  I have impressed upon him the need to stop smoking.  He says he cannot stop smoking and he is addicted to cigarettes and will be unable to stop.  At this point, he would need to undergo re-do L4-5 and L5-S1 decompression with fusion and he would need to have an external bone growth stimulator in an effort to allow him to heal.  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  He wishes to go ahead with surgery and is frustrated  with his continued and ongoing pain and I have recommended we proceed with that surgery.  Risks and benefits were discussed with the patient.  He will need to be fitted for an LSO brace.  He will need also to have cardiology clearance prior to surgery because of his significant cardiac issues.  In light of his worsening pain, at this point, I am going to take him out of work as he is unable to perform his job duties because his pain has worsened.  His strength appears to be full on confrontational testing, but he is walking with an antalgic gait and has significant right leg pain.  Jason [name] is not getting relief of his pain and he needs something for pain relief.  I will give him a prescription of narcotic analgesics.          Danae Orleans. 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  He complains of low back pain left greater than right and also left groin pain, and left leg pain to his knee.  His pain is increased when supine. He said he was pulling material for welding when he developed this pain.  He notes numbness in his left leg and weakness in his left leg. He says this began on 04/08/2010.  He works at [employer] and says that he has had to stop work because of this injury. He has been taking Oxycodone 5 mg. two to three per day, which increased recently and also Flexeril 10 mg. twice daily, Mobic 7.5 mg. twice daily, Xanax at night.  He notes pain radiating into his hip and into his left leg to his left knee.  He does not note any bowel or bladder dysfunction, although he does not problems with starting his urinary stream.    REVIEW OF SYSTEMS:                     A detailed Review of Systems sheet was reviewed with the patient.  Pertinent positives include wears glasses, ringing in ears, sinus problem, sinus headache, sore throat, high blood pressure, high cholesterol, leg pain while walking, shortness of breath, bronchitis, indigestion or pain with eating, difficulty starting stream, leg weakness, back pain, leg pain, arthritis, skin disease, skin cancer, anxiety, depression, diabetes, and inhalant (nasal) allergies.  All other systems are negative; this includes Constitutional symptoms, Breast, Neurologic,  Hematologic/Lymphatic, Immunologic.    PAST MEDICAL HISTORY:                   Current Medical Conditions:            He has a history of panic attacks since a myocardial infarction.  He has hypertension, noninsulin dependent diabetes, cholesterol, myocardial infarction x 2, cancer on his left forearm.      Prior Operations and Hospitalizations:     He has had basal cell surgery on his left arm in 2009, coronary artery bypass graft x 4 in 07/2009, two stents placed in 2004, and femoral artery surgery in 2004.      Medications and Allergies:              He is ALLERGIC TO SULFA MEDICATIONS AND CAUSES NAUSEA.  Medications - Oxycodone 5 mg. bid, Flexeril 10 mg. bid, Mobic 7.5 mg. bid, Xanax 0.5 mg. qhs, metformin 500 mg. bid, Aspirin 325 mg. qd, Zantac 150 mg. prn, Celexa 20 mg. qd, and Pravastatin 40 mg. qd.      Height and Weight:                        He is currently 5'9" tall, 168 lbs.    FAMILY HISTORY:                           His mother is 5 in poor health with a history of scoliosis.  His father is deceased.    SOCIAL HISTORY:                           He continues to smoke one pack of cigarettes per day and notes that he drinks about a beer per month.  He denies any other drug use.  DIAGNOSTIC STUDIES:                  Plain radiographs obtained in the office today demonstrate that he has L5 pars defects with L5 on S1.  He has 7.2  mm. in  anterolisthesis which increases to 9 mm. in extension and decreases to 3.8 mm. in neutral alignment.    His MRI demonstrates multilevel spondylosis, but in addition he has an L4-5 left-sided disc herniation with a superiorly migrated free fragment which appears to be causing left L4 nerve root compression.  This is based on MRI that was obtained through [facility] on 04/26/2010, which shows a large herniated disc with significant nerve root compression.    PHYSICAL EXAMINATION:                  General Appearance:                      On examination today, Jason [name] is a pleasant and cooperative man in no acute distress.      Blood Pressure, Pulse:                    Blood pressure is 108/70.  Heart rate is 86 and regular. Respiratory rate is 18.      HEENT - normocephalic, atraumatic.  The pupils are equal, round and reactive to light.  The extraocular muscles are intact.  Sclerae - white.  Conjunctiva - pink.  Oropharynx benign.  Uvula midline.     Neck - there are no masses, meningismus, deformities, tracheal deviation, jugular vein distention or carotid bruits.  There is normal cervical range of motion.  Spurlings' test is negative without reproducible radicular pain turning the patient's head to either side.  Lhermitte's sign is not present with axial compression.      Respiratory - there is normal respiratory effort with good intercostal function.  Lungs are clear to auscultation.  There are no rales, rhonchi or wheezes.      Cardiovascular - the heart has regular rate and rhythm to auscultation.  No murmurs are appreciated.  There is no extremity edema, cyanosis or clubbing.  There are palpable pedal pulses.      Abdomen - soft, nontender, no hepatosplenomegaly appreciated or masses.  There are active bowel sounds.  No guarding or rebound.      Musculoskeletal Examination - he has left sciatic notch discomfort.  He is able to bend to within 8" of the floor with his upper extremities  outstretched.  He is able to stand on his heels and toes.  He has decreased ability to squat on his left leg independently and he is able to squat on his right without difficulty.  Straight leg raising is positive at 30 degrees on the left.    NEUROLOGICAL EXAMINATION:     The patient is oriented to time, person and place and has good recall of both recent and remote memory with normal attention span and concentration.  The patient speaks with clear and fluent speech and exhibits normal language function and appropriate fund of knowledge.      Cranial Nerve Examination - pupils are equal, round and reactive to light.  Extraocular movements are full.  Visual fields are full to confrontational testing.  Facial sensation and facial movement are symmetric and intact.  Hearing is intact to finger rub.  Palate is upgoing.  Shoulder shrug is symmetric.  Tongue protrudes in the midline.  Motor Examination - motor strength is 5/5 in the bilateral deltoids, biceps, triceps, handgrips, wrist extensors, interosseous.  In the lower extremities motor strength is 5/5 in hip flexion, extension, hamstrings, plantar flexion, dorsiflexion and extensor hallucis longus, decreased left quadriceps strength on confrontational testing, and 5/5 right quadriceps.      Sensory Examination - he notes decreased pin sensation in a left L3 distribution.      Deep Tendon Reflexes - 2 in the biceps, triceps, and brachioradialis, 2 in the right knee and absent in the left knee, 2 in the ankles.  The great toes are downgoing to plantar stimulation.      Cerebellar Examination - normal coordination in upper and lower extremities and normal rapid alternating movements.  Romberg test is negative.    IMPRESSION AND RECOMMENDATIONS:       Jason [name] is a 62 year old man with low back pain, left leg pain and left leg weakness.  He has inability to squat on his left leg.  He has decreased quadriceps strength.  He has an absent  quadriceps reflex.  He does have an L5 pars defect with mobile spondylolisthesis of L5 on S1, although I do not believe this is the basis for his pain complaints of weakness which relate to a free fragment disc herniation at L4-5 on the left. I have recommended he undergo left L4-5 microdiscectomy. I would like to have him cleared by his cardiologist before proceeding with surgery.  He will stay out of work until surgery.  He was given a prescription for Oxycodone 10/325 one every 4 to 6 hours as needed for pain, dispense #50.  I met with Waynetta Pean, his nurse case manager, to discuss his situation and his surgical plan.  We will go ahead and plan for surgery after he has been authorized from a cardiac standpoint.    [facility]    Danae Orleans. Venetia Maxon, M.D.  JDS:aft cc:    Waynetta Pean, RN, case manager

## 2012-07-16 ENCOUNTER — Ambulatory Visit (HOSPITAL_COMMUNITY): Payer: Worker's Compensation | Admitting: Anesthesiology

## 2012-07-16 ENCOUNTER — Encounter (HOSPITAL_COMMUNITY): Payer: Self-pay | Admitting: Anesthesiology

## 2012-07-16 ENCOUNTER — Ambulatory Visit (HOSPITAL_COMMUNITY): Payer: Worker's Compensation

## 2012-07-16 ENCOUNTER — Inpatient Hospital Stay (HOSPITAL_COMMUNITY)
Admission: RE | Admit: 2012-07-16 | Discharge: 2012-07-20 | DRG: 460 | Disposition: A | Discharge status: Home / Self Care | Payer: Worker's Compensation | Source: Ambulatory Visit | Attending: Neurosurgery | Admitting: Neurosurgery

## 2012-07-16 ENCOUNTER — Encounter (HOSPITAL_COMMUNITY): Payer: Self-pay

## 2012-07-16 ENCOUNTER — Encounter (HOSPITAL_COMMUNITY)
Admission: RE | Disposition: A | Discharge status: Home / Self Care | Payer: Self-pay | Source: Ambulatory Visit | Attending: Neurosurgery

## 2012-07-16 DIAGNOSIS — J449 Chronic obstructive pulmonary disease, unspecified: Secondary | ICD-10-CM | POA: Diagnosis present

## 2012-07-16 DIAGNOSIS — I10 Essential (primary) hypertension: Secondary | ICD-10-CM | POA: Diagnosis present

## 2012-07-16 DIAGNOSIS — I252 Old myocardial infarction: Secondary | ICD-10-CM

## 2012-07-16 DIAGNOSIS — Z23 Encounter for immunization: Secondary | ICD-10-CM

## 2012-07-16 DIAGNOSIS — J4489 Other specified chronic obstructive pulmonary disease: Secondary | ICD-10-CM | POA: Diagnosis present

## 2012-07-16 DIAGNOSIS — I739 Peripheral vascular disease, unspecified: Secondary | ICD-10-CM | POA: Diagnosis present

## 2012-07-16 DIAGNOSIS — M5126 Other intervertebral disc displacement, lumbar region: Secondary | ICD-10-CM | POA: Diagnosis present

## 2012-07-16 DIAGNOSIS — F17200 Nicotine dependence, unspecified, uncomplicated: Secondary | ICD-10-CM | POA: Diagnosis present

## 2012-07-16 DIAGNOSIS — I2510 Atherosclerotic heart disease of native coronary artery without angina pectoris: Secondary | ICD-10-CM | POA: Diagnosis present

## 2012-07-16 DIAGNOSIS — M47817 Spondylosis without myelopathy or radiculopathy, lumbosacral region: Principal | ICD-10-CM | POA: Diagnosis present

## 2012-07-16 DIAGNOSIS — Q762 Congenital spondylolisthesis: Secondary | ICD-10-CM

## 2012-07-16 LAB — GLUCOSE, CAPILLARY
Glucose-Capillary: 171 mg/dL — ABNORMAL HIGH (ref 70–99)
Glucose-Capillary: 268 mg/dL — ABNORMAL HIGH (ref 70–99)

## 2012-07-16 SURGERY — POSTERIOR LUMBAR FUSION 2 LEVEL
Anesthesia: General | Site: Back | Laterality: Bilateral | Wound class: Clean

## 2012-07-16 MED ORDER — METFORMIN HCL 500 MG PO TABS
500.0000 mg | ORAL_TABLET | Freq: Two times a day (BID) | ORAL | Status: DC
  Administered 2012-07-17 – 2012-07-18 (×4): 1000 mg via ORAL
  Administered 2012-07-19: 500 mg via ORAL
  Administered 2012-07-19: 1000 mg via ORAL
  Administered 2012-07-20: 500 mg via ORAL
  Filled 2012-07-16 (×9): qty 2

## 2012-07-16 MED ORDER — SODIUM CHLORIDE 0.9 % IV SOLN: 250.0000 mL | INTRAVENOUS | Status: DC

## 2012-07-16 MED ORDER — SIMVASTATIN 5 MG PO TABS: 5.0000 mg | ORAL_TABLET | Freq: Every day | ORAL | Status: DC

## 2012-07-16 MED ORDER — SODIUM CHLORIDE 0.9 % IJ SOLN
3.0000 mL | Freq: Two times a day (BID) | INTRAMUSCULAR | Status: DC
  Administered 2012-07-17 – 2012-07-19 (×4): 3 mL via INTRAVENOUS

## 2012-07-16 MED ORDER — GUAIFENESIN 100 MG/5ML PO SOLN
5.0000 mL | Freq: Three times a day (TID) | ORAL | Status: DC | PRN
  Filled 2012-07-16: qty 5

## 2012-07-16 MED ORDER — SODIUM CHLORIDE 0.9 % IJ SOLN: 9.0000 mL | INTRAMUSCULAR | Status: DC | PRN

## 2012-07-16 MED ORDER — OXYCODONE HCL 5 MG PO TABS: 5.0000 mg | ORAL_TABLET | Freq: Once | ORAL | Status: DC | PRN

## 2012-07-16 MED ORDER — INSULIN ASPART 100 UNIT/ML ~~LOC~~ SOLN
6.0000 [IU] | Freq: Once | SUBCUTANEOUS | Status: AC
  Administered 2012-07-16: 6 [IU] via SUBCUTANEOUS

## 2012-07-16 MED ORDER — NEOSTIGMINE METHYLSULFATE 1 MG/ML IJ SOLN
INTRAMUSCULAR | Status: DC | PRN
  Administered 2012-07-16: 4 mg via INTRAVENOUS

## 2012-07-16 MED ORDER — HYDROMORPHONE HCL PF 1 MG/ML IJ SOLN
0.2500 mg | INTRAMUSCULAR | Status: DC | PRN
  Administered 2012-07-16 (×3): 0.5 mg via INTRAVENOUS

## 2012-07-16 MED ORDER — OXYCODONE-ACETAMINOPHEN 5-325 MG PO TABS
1.0000 | ORAL_TABLET | Freq: Three times a day (TID) | ORAL | Status: DC | PRN
  Filled 2012-07-16: qty 2

## 2012-07-16 MED ORDER — DEXTROSE 5 % IV SOLN
INTRAVENOUS | Status: DC | PRN
  Administered 2012-07-16 (×3): via INTRAVENOUS

## 2012-07-16 MED ORDER — PROMETHAZINE HCL 25 MG/ML IJ SOLN: 6.2500 mg | INTRAMUSCULAR | Status: DC | PRN

## 2012-07-16 MED ORDER — CEFAZOLIN SODIUM-DEXTROSE 2-3 GM-% IV SOLR
INTRAVENOUS | Status: AC
  Filled 2012-07-16: qty 50

## 2012-07-16 MED ORDER — THROMBIN 20000 UNITS EX SOLR
CUTANEOUS | Status: DC | PRN
  Administered 2012-07-16: 12:00:00 via TOPICAL

## 2012-07-16 MED ORDER — ISOSORBIDE MONONITRATE ER 30 MG PO TB24
30.0000 mg | ORAL_TABLET | Freq: Every day | ORAL | Status: DC
  Administered 2012-07-17 – 2012-07-19 (×3): 30 mg via ORAL
  Filled 2012-07-16 (×4): qty 1

## 2012-07-16 MED ORDER — SENNOSIDES-DOCUSATE SODIUM 8.6-50 MG PO TABS
1.0000 | ORAL_TABLET | Freq: Every evening | ORAL | Status: DC | PRN
  Filled 2012-07-16: qty 1

## 2012-07-16 MED ORDER — ROCURONIUM BROMIDE 100 MG/10ML IV SOLN
INTRAVENOUS | Status: DC | PRN
  Administered 2012-07-16: 60 mg via INTRAVENOUS
  Administered 2012-07-16: 10 mg via INTRAVENOUS
  Administered 2012-07-16: 20 mg via INTRAVENOUS
  Administered 2012-07-16: 10 mg via INTRAVENOUS

## 2012-07-16 MED ORDER — ACETAMINOPHEN 10 MG/ML IV SOLN
1000.0000 mg | Freq: Once | INTRAVENOUS | Status: AC
  Administered 2012-07-16: 1000 mg via INTRAVENOUS
  Filled 2012-07-16: qty 100

## 2012-07-16 MED ORDER — FENTANYL CITRATE 0.05 MG/ML IJ SOLN
INTRAMUSCULAR | Status: DC | PRN
  Administered 2012-07-16: 100 ug via INTRAVENOUS
  Administered 2012-07-16 (×2): 50 ug via INTRAVENOUS
  Administered 2012-07-16 (×3): 100 ug via INTRAVENOUS

## 2012-07-16 MED ORDER — OXYCODONE-ACETAMINOPHEN 10-325 MG PO TABS: 1.0000 | ORAL_TABLET | Freq: Three times a day (TID) | ORAL | Status: DC | PRN

## 2012-07-16 MED ORDER — ACETAMINOPHEN 10 MG/ML IV SOLN
INTRAVENOUS | Status: AC
  Filled 2012-07-16: qty 100

## 2012-07-16 MED ORDER — ASPIRIN 325 MG PO TABS
325.0000 mg | ORAL_TABLET | Freq: Every day | ORAL | Status: DC
  Administered 2012-07-17 – 2012-07-20 (×4): 325 mg via ORAL
  Filled 2012-07-16 (×5): qty 1

## 2012-07-16 MED ORDER — FENTANYL CITRATE 0.05 MG/ML IJ SOLN: 50.0000 ug | Freq: Once | INTRAMUSCULAR | Status: DC

## 2012-07-16 MED ORDER — HYDROMORPHONE HCL PF 1 MG/ML IJ SOLN
INTRAMUSCULAR | Status: AC
  Filled 2012-07-16: qty 1

## 2012-07-16 MED ORDER — ZOLPIDEM TARTRATE 5 MG PO TABS: 5.0000 mg | ORAL_TABLET | Freq: Every evening | ORAL | Status: DC | PRN

## 2012-07-16 MED ORDER — OXYCODONE HCL 5 MG/5ML PO SOLN: 5.0000 mg | Freq: Once | ORAL | Status: DC | PRN

## 2012-07-16 MED ORDER — LIDOCAINE-EPINEPHRINE 1 %-1:100000 IJ SOLN
INTRAMUSCULAR | Status: DC | PRN
  Administered 2012-07-16: 5 mL

## 2012-07-16 MED ORDER — ALBUTEROL SULFATE (5 MG/ML) 0.5% IN NEBU: 2.5000 mg | INHALATION_SOLUTION | Freq: Four times a day (QID) | RESPIRATORY_TRACT | Status: DC | PRN

## 2012-07-16 MED ORDER — OXYCODONE-ACETAMINOPHEN 5-325 MG PO TABS
1.0000 | ORAL_TABLET | ORAL | Status: DC | PRN
  Administered 2012-07-17 – 2012-07-20 (×11): 2 via ORAL
  Filled 2012-07-16 (×10): qty 2

## 2012-07-16 MED ORDER — CEFAZOLIN SODIUM 1-5 GM-% IV SOLN
1.0000 g | Freq: Three times a day (TID) | INTRAVENOUS | Status: AC
  Administered 2012-07-16 – 2012-07-17 (×2): 1 g via INTRAVENOUS
  Filled 2012-07-16 (×2): qty 50

## 2012-07-16 MED ORDER — LACTATED RINGERS IV SOLN
INTRAVENOUS | Status: DC | PRN
  Administered 2012-07-16 (×3): via INTRAVENOUS

## 2012-07-16 MED ORDER — BUPIVACAINE HCL (PF) 0.5 % IJ SOLN
INTRAMUSCULAR | Status: DC | PRN
  Administered 2012-07-16: 5 mL

## 2012-07-16 MED ORDER — MIDAZOLAM HCL 5 MG/5ML IJ SOLN
INTRAMUSCULAR | Status: DC | PRN
  Administered 2012-07-16: 2 mg via INTRAVENOUS

## 2012-07-16 MED ORDER — NITROGLYCERIN 0.4 MG SL SUBL: 0.4000 mg | SUBLINGUAL_TABLET | SUBLINGUAL | Status: DC | PRN

## 2012-07-16 MED ORDER — GUAIFENESIN 100 MG/5ML PO LIQD: 100.0000 mg | Freq: Three times a day (TID) | ORAL | Status: DC | PRN

## 2012-07-16 MED ORDER — ONDANSETRON HCL 4 MG/2ML IJ SOLN
INTRAMUSCULAR | Status: DC | PRN
  Administered 2012-07-16: 4 mg via INTRAVENOUS

## 2012-07-16 MED ORDER — DEXAMETHASONE SODIUM PHOSPHATE 4 MG/ML IJ SOLN
INTRAMUSCULAR | Status: DC | PRN
  Administered 2012-07-16: 8 mg via INTRAVENOUS

## 2012-07-16 MED ORDER — OXYCODONE HCL 5 MG PO TABS: 5.0000 mg | ORAL_TABLET | Freq: Three times a day (TID) | ORAL | Status: DC | PRN

## 2012-07-16 MED ORDER — ONDANSETRON HCL 4 MG/2ML IJ SOLN: 4.0000 mg | INTRAMUSCULAR | Status: DC | PRN

## 2012-07-16 MED ORDER — SODIUM CHLORIDE 0.9 % IJ SOLN: 3.0000 mL | INTRAMUSCULAR | Status: DC | PRN

## 2012-07-16 MED ORDER — MENTHOL 3 MG MT LOZG: 1.0000 | LOZENGE | OROMUCOSAL | Status: DC | PRN

## 2012-07-16 MED ORDER — PHENYLEPHRINE HCL 10 MG/ML IJ SOLN
20.0000 mg | INTRAVENOUS | Status: DC | PRN
  Administered 2012-07-16: 25 ug/min via INTRAVENOUS

## 2012-07-16 MED ORDER — KCL IN DEXTROSE-NACL 20-5-0.45 MEQ/L-%-% IV SOLN
INTRAVENOUS | Status: AC
  Filled 2012-07-16: qty 1000

## 2012-07-16 MED ORDER — ALBUTEROL SULFATE HFA 108 (90 BASE) MCG/ACT IN AERS
INHALATION_SPRAY | RESPIRATORY_TRACT | Status: DC | PRN
  Administered 2012-07-16 (×2): 5 via RESPIRATORY_TRACT
  Administered 2012-07-16: 4 via RESPIRATORY_TRACT

## 2012-07-16 MED ORDER — INSULIN ASPART 100 UNIT/ML ~~LOC~~ SOLN
0.0000 [IU] | Freq: Three times a day (TID) | SUBCUTANEOUS | Status: DC
  Administered 2012-07-17: 5 [IU] via SUBCUTANEOUS
  Administered 2012-07-17: 3 [IU] via SUBCUTANEOUS
  Administered 2012-07-17: 8 [IU] via SUBCUTANEOUS
  Administered 2012-07-18 (×3): 3 [IU] via SUBCUTANEOUS
  Administered 2012-07-19 (×3): 2 [IU] via SUBCUTANEOUS
  Administered 2012-07-20: 3 [IU] via SUBCUTANEOUS

## 2012-07-16 MED ORDER — TIZANIDINE HCL 4 MG PO TABS
4.0000 mg | ORAL_TABLET | Freq: Every day | ORAL | Status: DC
  Administered 2012-07-16: 8 mg via ORAL
  Administered 2012-07-17 – 2012-07-19 (×3): 4 mg via ORAL
  Filled 2012-07-16 (×5): qty 3

## 2012-07-16 MED ORDER — BISACODYL 10 MG RE SUPP: 10.0000 mg | Freq: Every day | RECTAL | Status: DC | PRN

## 2012-07-16 MED ORDER — ALBUMIN HUMAN 5 % IV SOLN
INTRAVENOUS | Status: DC | PRN
  Administered 2012-07-16: 15:00:00 via INTRAVENOUS

## 2012-07-16 MED ORDER — DOXEPIN HCL 75 MG PO CAPS
150.0000 mg | ORAL_CAPSULE | Freq: Every day | ORAL | Status: DC
  Administered 2012-07-16 – 2012-07-19 (×4): 150 mg via ORAL
  Filled 2012-07-16 (×5): qty 2

## 2012-07-16 MED ORDER — INSULIN ASPART 100 UNIT/ML ~~LOC~~ SOLN
0.0000 [IU] | Freq: Every day | SUBCUTANEOUS | Status: DC
  Administered 2012-07-16: 4 [IU] via SUBCUTANEOUS
  Administered 2012-07-17 – 2012-07-18 (×2): 2 [IU] via SUBCUTANEOUS

## 2012-07-16 MED ORDER — LISINOPRIL 10 MG PO TABS
10.0000 mg | ORAL_TABLET | Freq: Every day | ORAL | Status: DC
  Administered 2012-07-17 – 2012-07-19 (×2): 10 mg via ORAL
  Filled 2012-07-16 (×4): qty 1

## 2012-07-16 MED ORDER — PHENOL 1.4 % MT LIQD: 1.0000 | OROMUCOSAL | Status: DC | PRN

## 2012-07-16 MED ORDER — DIPHENHYDRAMINE HCL 50 MG/ML IJ SOLN: 12.5000 mg | Freq: Four times a day (QID) | INTRAMUSCULAR | Status: DC | PRN

## 2012-07-16 MED ORDER — CEFAZOLIN SODIUM-DEXTROSE 2-3 GM-% IV SOLR
2.0000 g | INTRAVENOUS | Status: AC
  Filled 2012-07-16: qty 50

## 2012-07-16 MED ORDER — PHENYLEPHRINE HCL 10 MG/ML IJ SOLN
INTRAMUSCULAR | Status: DC | PRN
  Administered 2012-07-16 (×3): 80 ug via INTRAVENOUS

## 2012-07-16 MED ORDER — BACITRACIN 50000 UNITS IM SOLR
INTRAMUSCULAR | Status: AC
  Filled 2012-07-16: qty 1

## 2012-07-16 MED ORDER — MORPHINE SULFATE (PF) 1 MG/ML IV SOLN
INTRAVENOUS | Status: AC
  Administered 2012-07-16: 19:00:00
  Filled 2012-07-16: qty 25

## 2012-07-16 MED ORDER — ISOSORBIDE MONONITRATE ER 30 MG PO TB24: 30.0000 mg | ORAL_TABLET | Freq: Every day | ORAL | Status: DC

## 2012-07-16 MED ORDER — INFLUENZA VIRUS VACC SPLIT PF IM SUSP
0.5000 mL | INTRAMUSCULAR | Status: AC
  Administered 2012-07-17: 0.5 mL via INTRAMUSCULAR
  Filled 2012-07-16: qty 0.5

## 2012-07-16 MED ORDER — NALOXONE HCL 0.4 MG/ML IJ SOLN: 0.4000 mg | INTRAMUSCULAR | Status: DC | PRN

## 2012-07-16 MED ORDER — ACETAMINOPHEN 650 MG RE SUPP: 650.0000 mg | RECTAL | Status: DC | PRN

## 2012-07-16 MED ORDER — MORPHINE SULFATE (PF) 1 MG/ML IV SOLN
INTRAVENOUS | Status: DC
  Administered 2012-07-16: 9 mg via INTRAVENOUS
  Administered 2012-07-17: 25 mg via INTRAVENOUS
  Administered 2012-07-17: 18 mg via INTRAVENOUS
  Administered 2012-07-17: 7.5 mg via INTRAVENOUS
  Administered 2012-07-17: 9.99 mg via INTRAVENOUS
  Administered 2012-07-17: 19.39 mg via INTRAVENOUS
  Administered 2012-07-17: 15:00:00 via INTRAVENOUS
  Administered 2012-07-18: 2.87 mg via INTRAVENOUS
  Administered 2012-07-18: 4.5 mg via INTRAVENOUS
  Filled 2012-07-16 (×4): qty 25

## 2012-07-16 MED ORDER — ALUM & MAG HYDROXIDE-SIMETH 200-200-20 MG/5ML PO SUSP: 30.0000 mL | Freq: Four times a day (QID) | ORAL | Status: DC | PRN

## 2012-07-16 MED ORDER — MIDAZOLAM HCL 2 MG/2ML IJ SOLN: 1.0000 mg | INTRAMUSCULAR | Status: DC | PRN

## 2012-07-16 MED ORDER — DOCUSATE SODIUM 100 MG PO CAPS
100.0000 mg | ORAL_CAPSULE | Freq: Two times a day (BID) | ORAL | Status: DC
  Administered 2012-07-16 – 2012-07-20 (×8): 100 mg via ORAL
  Filled 2012-07-16 (×6): qty 1

## 2012-07-16 MED ORDER — SIMVASTATIN 20 MG PO TABS
20.0000 mg | ORAL_TABLET | Freq: Every day | ORAL | Status: DC
  Administered 2012-07-16 – 2012-07-19 (×4): 20 mg via ORAL
  Filled 2012-07-16 (×5): qty 1

## 2012-07-16 MED ORDER — ACETAMINOPHEN 325 MG PO TABS: 650.0000 mg | ORAL_TABLET | ORAL | Status: DC | PRN

## 2012-07-16 MED ORDER — DIAZEPAM 5 MG PO TABS
5.0000 mg | ORAL_TABLET | Freq: Four times a day (QID) | ORAL | Status: DC | PRN
  Administered 2012-07-18 – 2012-07-19 (×4): 5 mg via ORAL
  Filled 2012-07-16 (×4): qty 1

## 2012-07-16 MED ORDER — DIPHENHYDRAMINE HCL 12.5 MG/5ML PO ELIX: 12.5000 mg | ORAL_SOLUTION | Freq: Four times a day (QID) | ORAL | Status: DC | PRN

## 2012-07-16 MED ORDER — HYDROCODONE-ACETAMINOPHEN 5-325 MG PO TABS: 1.0000 | ORAL_TABLET | ORAL | Status: DC | PRN

## 2012-07-16 MED ORDER — INSULIN ASPART 100 UNIT/ML ~~LOC~~ SOLN
SUBCUTANEOUS | Status: AC
  Filled 2012-07-16: qty 1

## 2012-07-16 MED ORDER — KCL IN DEXTROSE-NACL 20-5-0.45 MEQ/L-%-% IV SOLN
INTRAVENOUS | Status: DC
  Administered 2012-07-16: 17:00:00 via INTRAVENOUS
  Filled 2012-07-16 (×8): qty 1000

## 2012-07-16 MED ORDER — LIDOCAINE HCL (CARDIAC) 20 MG/ML IV SOLN
INTRAVENOUS | Status: DC | PRN
  Administered 2012-07-16: 100 mg via INTRAVENOUS

## 2012-07-16 MED ORDER — GLYCOPYRROLATE 0.2 MG/ML IJ SOLN
INTRAMUSCULAR | Status: DC | PRN
  Administered 2012-07-16: 0.4 mg via INTRAVENOUS

## 2012-07-16 MED ORDER — FLEET ENEMA 7-19 GM/118ML RE ENEM: 1.0000 | ENEMA | Freq: Once | RECTAL | Status: AC | PRN

## 2012-07-16 MED ORDER — ONDANSETRON HCL 4 MG/2ML IJ SOLN: 4.0000 mg | Freq: Four times a day (QID) | INTRAMUSCULAR | Status: DC | PRN

## 2012-07-16 MED ORDER — SODIUM CHLORIDE 0.9 % IR SOLN
Status: DC | PRN
  Administered 2012-07-16: 12:00:00

## 2012-07-16 MED ORDER — METOPROLOL TARTRATE 50 MG PO TABS
50.0000 mg | ORAL_TABLET | Freq: Two times a day (BID) | ORAL | Status: DC
  Administered 2012-07-16 – 2012-07-19 (×7): 50 mg via ORAL
  Filled 2012-07-16 (×9): qty 1

## 2012-07-16 MED ORDER — SODIUM CHLORIDE 0.9 % IV SOLN
INTRAVENOUS | Status: AC
  Filled 2012-07-16: qty 500

## 2012-07-16 MED ORDER — PROPOFOL 10 MG/ML IV BOLUS
INTRAVENOUS | Status: DC | PRN
  Administered 2012-07-16: 160 mg via INTRAVENOUS

## 2012-07-16 MED ORDER — FAMOTIDINE 20 MG PO TABS
20.0000 mg | ORAL_TABLET | Freq: Every day | ORAL | Status: DC
  Administered 2012-07-16 – 2012-07-20 (×5): 20 mg via ORAL
  Filled 2012-07-16 (×5): qty 1

## 2012-07-16 MED ORDER — 0.9 % SODIUM CHLORIDE (POUR BTL) OPTIME
TOPICAL | Status: DC | PRN
  Administered 2012-07-16 (×2): 1000 mL

## 2012-07-16 MED ORDER — ARTIFICIAL TEARS OP OINT
TOPICAL_OINTMENT | OPHTHALMIC | Status: DC | PRN
  Administered 2012-07-16: 1 via OPHTHALMIC

## 2012-07-16 SURGICAL SUPPLY — 83 items
ADH SKN CLS APL DERMABOND .7 (GAUZE/BANDAGES/DRESSINGS) ×1
APL SKNCLS STERI-STRIP NONHPOA (GAUZE/BANDAGES/DRESSINGS) ×2
BAG DECANTER FOR FLEXI CONT (MISCELLANEOUS) ×2 IMPLANT
BENZOIN TINCTURE PRP APPL 2/3 (GAUZE/BANDAGES/DRESSINGS) ×2 IMPLANT
BLADE SURG ROTATE 9660 (MISCELLANEOUS) ×1 IMPLANT
BONE VOID FILLER STRIP 10CC (Bone Implant) ×1 IMPLANT
BUR MATCHSTICK NEURO 3.0 LAGG (BURR) ×2 IMPLANT
BUR PRECISION FLUTE 5.0 (BURR) ×2 IMPLANT
CAGE 9MM (Cage) ×4 IMPLANT
CANISTER SUCTION 2500CC (MISCELLANEOUS) ×2 IMPLANT
CLOTH BEACON ORANGE TIMEOUT ST (SAFETY) ×2 IMPLANT
CONT SPEC 4OZ CLIKSEAL STRL BL (MISCELLANEOUS) ×4 IMPLANT
COVER BACK TABLE 24X17X13 BIG (DRAPES) IMPLANT
COVER TABLE BACK 60X90 (DRAPES) ×2 IMPLANT
DERMABOND ADVANCED (GAUZE/BANDAGES/DRESSINGS) ×1
DERMABOND ADVANCED .7 DNX12 (GAUZE/BANDAGES/DRESSINGS) ×1 IMPLANT
DRAPE C-ARM 42X72 X-RAY (DRAPES) ×4 IMPLANT
DRAPE LAPAROTOMY 100X72X124 (DRAPES) ×2 IMPLANT
DRAPE POUCH INSTRU U-SHP 10X18 (DRAPES) ×2 IMPLANT
DRAPE SURG 17X23 STRL (DRAPES) ×2 IMPLANT
DRESSING TELFA 8X3 (GAUZE/BANDAGES/DRESSINGS) IMPLANT
DRSG OPSITE 4X5.5 SM (GAUZE/BANDAGES/DRESSINGS) ×3 IMPLANT
DURAPREP 26ML APPLICATOR (WOUND CARE) ×2 IMPLANT
ELECT REM PT RETURN 9FT ADLT (ELECTROSURGICAL) ×2
ELECTRODE REM PT RTRN 9FT ADLT (ELECTROSURGICAL) ×1 IMPLANT
EVACUATOR 1/8 PVC DRAIN (DRAIN) ×1 IMPLANT
GAUZE SPONGE 4X4 16PLY XRAY LF (GAUZE/BANDAGES/DRESSINGS) IMPLANT
GLOVE BIO SURGEON STRL SZ 6.5 (GLOVE) ×1 IMPLANT
GLOVE BIO SURGEON STRL SZ8 (GLOVE) ×5 IMPLANT
GLOVE BIOGEL PI IND STRL 6.5 (GLOVE) IMPLANT
GLOVE BIOGEL PI IND STRL 7.0 (GLOVE) IMPLANT
GLOVE BIOGEL PI IND STRL 8 (GLOVE) ×2 IMPLANT
GLOVE BIOGEL PI IND STRL 8.5 (GLOVE) ×2 IMPLANT
GLOVE BIOGEL PI INDICATOR 6.5 (GLOVE) ×2
GLOVE BIOGEL PI INDICATOR 7.0 (GLOVE) ×1
GLOVE BIOGEL PI INDICATOR 8 (GLOVE) ×2
GLOVE BIOGEL PI INDICATOR 8.5 (GLOVE) ×2
GLOVE ECLIPSE 8.0 STRL XLNG CF (GLOVE) ×4 IMPLANT
GLOVE EXAM NITRILE LRG STRL (GLOVE) IMPLANT
GLOVE EXAM NITRILE MD LF STRL (GLOVE) IMPLANT
GLOVE EXAM NITRILE XL STR (GLOVE) IMPLANT
GLOVE EXAM NITRILE XS STR PU (GLOVE) IMPLANT
GLOVE INDICATOR 8.5 STRL (GLOVE) ×1 IMPLANT
GLOVE SURG SS PI 7.0 STRL IVOR (GLOVE) ×4 IMPLANT
GOWN BRE IMP SLV AUR LG STRL (GOWN DISPOSABLE) ×1 IMPLANT
GOWN BRE IMP SLV AUR XL STRL (GOWN DISPOSABLE) ×8 IMPLANT
GOWN STRL REIN 2XL LVL4 (GOWN DISPOSABLE) ×2 IMPLANT
KIT BASIN OR (CUSTOM PROCEDURE TRAY) ×2 IMPLANT
KIT INFUSE SMALL (Orthopedic Implant) ×1 IMPLANT
KIT POSITION SURG JACKSON T1 (MISCELLANEOUS) ×2 IMPLANT
KIT ROOM TURNOVER OR (KITS) ×2 IMPLANT
MILL MEDIUM DISP (BLADE) ×2 IMPLANT
NDL HYPO 25X1 1.5 SAFETY (NEEDLE) ×1 IMPLANT
NDL SPNL 18GX3.5 QUINCKE PK (NEEDLE) IMPLANT
NEEDLE HYPO 25X1 1.5 SAFETY (NEEDLE) ×2 IMPLANT
NEEDLE SPNL 18GX3.5 QUINCKE PK (NEEDLE) ×2 IMPLANT
NS IRRIG 1000ML POUR BTL (IV SOLUTION) ×2 IMPLANT
PACK LAMINECTOMY NEURO (CUSTOM PROCEDURE TRAY) ×2 IMPLANT
PAD ARMBOARD 7.5X6 YLW CONV (MISCELLANEOUS) ×6 IMPLANT
PATTIES SURGICAL .5 X.5 (GAUZE/BANDAGES/DRESSINGS) IMPLANT
PATTIES SURGICAL .5 X1 (DISPOSABLE) IMPLANT
PATTIES SURGICAL 1X1 (DISPOSABLE) IMPLANT
ROD TI 5.5MM 5CM (Rod) ×2 IMPLANT
SCREW 50MM (Screw) ×4 IMPLANT
SCREW PEDICLE 45MM (Screw) ×2 IMPLANT
SCREW SET SPINAL STD HEXALOBE (Screw) ×6 IMPLANT
SPONGE GAUZE 4X4 12PLY (GAUZE/BANDAGES/DRESSINGS) ×2 IMPLANT
SPONGE LAP 4X18 X RAY DECT (DISPOSABLE) IMPLANT
SPONGE SURGIFOAM ABS GEL 100 (HEMOSTASIS) ×2 IMPLANT
STAPLER SKIN PROX WIDE 3.9 (STAPLE) IMPLANT
STRIP CLOSURE SKIN 1/2X4 (GAUZE/BANDAGES/DRESSINGS) ×2 IMPLANT
SUT VIC AB 1 CT1 18XBRD ANBCTR (SUTURE) ×2 IMPLANT
SUT VIC AB 1 CT1 8-18 (SUTURE) ×4
SUT VIC AB 2-0 CT1 18 (SUTURE) ×4 IMPLANT
SUT VIC AB 3-0 SH 8-18 (SUTURE) ×4 IMPLANT
SYR 20ML ECCENTRIC (SYRINGE) ×2 IMPLANT
SYR 3ML LL SCALE MARK (SYRINGE) ×8 IMPLANT
SYR 5ML LL (SYRINGE) ×1 IMPLANT
TOWEL OR 17X24 6PK STRL BLUE (TOWEL DISPOSABLE) ×2 IMPLANT
TOWEL OR 17X26 10 PK STRL BLUE (TOWEL DISPOSABLE) ×2 IMPLANT
TRAP SPECIMEN MUCOUS 40CC (MISCELLANEOUS) ×2 IMPLANT
TRAY FOLEY CATH 14FRSI W/METER (CATHETERS) ×2 IMPLANT
WATER STERILE IRR 1000ML POUR (IV SOLUTION) ×2 IMPLANT

## 2012-07-16 NOTE — Transfer of Care (Signed)
Immediate Anesthesia Transfer of Care Note  Patient: RANEY KOEPPEN  Procedure(s) Performed: Procedure(s) (LRB) with comments: POSTERIOR LUMBAR FUSION 2 LEVEL (Bilateral) - Redo Lumbar four-five, lumbar five sacral one Decompression/Fusion  Patient Location: PACU  Anesthesia Type:General  Level of Consciousness: awake, alert , oriented and patient cooperative  Airway & Oxygen Therapy: Patient Spontanous Breathing and Patient connected to nasal cannula oxygen  Post-op Assessment: Report given to PACU RN and Post -op Vital signs reviewed and stable  Post vital signs: Reviewed and stable  Complications: No apparent anesthesia complications

## 2012-07-16 NOTE — Progress Notes (Signed)
Sleepy, but arousable.  MAEW with good power.  Doing well.  

## 2012-07-16 NOTE — Brief Op Note (Signed)
07/16/2012  4:24 PM  PATIENT:  Jason [name]  62 y.o. male  PRE-OPERATIVE DIAGNOSIS:  Congenital spondylolysis L5 , Spondylolisthesis L5 S1 AND L 45 , Recurrent lumbar disc herniation, lumbar stenosis, Lumbar radiculopathy  POST-OPERATIVE DIAGNOSIS: Congenital spondylolysis L5 , Spondylolisthesis L5 S1 AND L 45 , Recurrent lumbar disc herniation, lumbar stenosis, Lumbar radiculopathy  PROCEDURE:  Procedure(s) (LRB) with comments: POSTERIOR LUMBAR FUSION 2 LEVEL (Bilateral) - Redo Lumbar four-five, lumbar five sacral one Decompression/Fusion with L5 Gill procedure and L45 redo and L5S1 discectomies with PLIF L45 and L5S1 with pedicle screw fixation with posterolateral arthrodesis  SURGEON:  Surgeon(s) and Role:    * Maeola Harman, MD - Primary    * Mariam Dollar, MD - Assisting  PHYSICIAN ASSISTANT:   ASSISTANTS: Poteat, RN   ANESTHESIA:   general  EBL:  Total I/O In: 3490 [I.V.:3100; Blood:140; IV Piggyback:250] Out: 900 [Urine:600; Blood:300]  BLOOD ADMINISTERED:none  DRAINS: (Medium) Hemovact drain(s) in the epidural space with  Suction Open   LOCAL MEDICATIONS USED:  LIDOCAINE   SPECIMEN:  No Specimen  DISPOSITION OF SPECIMEN:  N/A  COUNTS:  YES  TOURNIQUET:  * No tourniquets in log *  DICTATION: DICTATION: Patient is 62 year old man with  Spondylolysis L5 with spondylolisthesis L5S1 and L45 with prior discectomy L45 Left, stenosis, DDD, radiculopathy L4/5, L5/S1. He has a severe right leg pain and weakness. It was elected to take him to surgery for decompression and fusion at L4/5 and L5/S1 levels.    Procedure: Patient was placed in a prone position on the [place] table after smooth and uncomplicated induction of general endotracheal anesthesia. His low back was prepped and draped in usual sterile fashion with betadine scrub and DuraPrep. Area of incision was infiltrated with local lidocaine. Incision was made to the lumbodorsal fascia was incised and exposure was  performed of the L4/5, L5/S1 spinous processes laminae facet joint and transverse processes. Intraoperative x-ray was obtained which confirmed correct orientation. A Gill procedure of L5 was performed with disarticulation of the facet joints as well as a total laminectomy of L4  with disarticulation of the facet joints at this level and thorough decompression was performed of both L4, L5 and S1 nerve roots along with the common dural tube. There was densely adherent spondylytic scar material compressing the thecal sac and left  L4 and L5 nerve roots.  Decompression was greater than would be typically performed for simple interbody fusion. A thorough discectomy and preparation of the endplates was performed at both the L4/5 and L5/S1 levels.  The interspaces were packed with small BMP,NexOss bone graft extender, and PEEK cages (9mm at L5/S1 and 9mm at L4/5) . Bone autograft was packed within the interspace bilaterally along with small BMP kit and NexOss bone graft extender. 6 cc of autograft was placed in the L5/S1 interspace along with an 9mm PEEK PLIF spacer. After a thorough decompression with bilateral discectomy at L4/5, bilateral 9mm mm peek cages were packed with BMP and extender and a similar amount of bone autograft  was inserted the interspace and countersunk appropriately. The posterolateral region was extensively decorticated and pedicle probes were placed at L4, L5 and S1 bilaterally. Intraoperative fluoroscopy confirmed correct orientationin the AP and lateral plane. 45 x 6.5 mm pedicle screws were placed at S1 bilaterally and 50 x 6.5 mm screws placed at L5 bilaterally and 50 x 6.21mm screws were placed at L4 bilaterally. Final x-rays demonstrated well-positioned interbody grafts and pedicle screw fixation.50 mm lordotic  rods were placed and locked down in situ and the posterolateral region was packed with the remaining bone graft  on the right and bone graft extender on the left. A medium Hemovac drain  was placed and anchored with a stitch.  Fascia was closed with 1 Vicryl sutures skin edges were reapproximated 2 and 3-0 Vicryl sutures. The wound is dressed with benzoin Steri-Strips Telfa gauze and tape. The patient was extubated in the operating room and taken to recovery in stable satisfactory condition having tolerated the operation well. Counts were correct at the end of the case.    PLAN OF CARE: Admit to inpatient   PATIENT DISPOSITION:  PACU - hemodynamically stable.   Delay start of Pharmacological VTE agent (>24hrs) due to surgical blood loss or risk of bleeding: yes

## 2012-07-16 NOTE — Preoperative (Signed)
Beta Blockers   Pt taking Metoprolol and Imdur.  Believes he took them both last evening

## 2012-07-16 NOTE — Anesthesia Procedure Notes (Signed)
Procedure Name: Intubation Date/Time: 07/16/2012 11:43 AM Performed by: Tyrone Nine Pre-anesthesia Checklist: Patient identified, Timeout performed, Emergency Drugs available, Suction available and Patient being monitored Patient Re-evaluated:Patient Re-evaluated prior to inductionOxygen Delivery Method: Circle system utilized Preoxygenation: Pre-oxygenation with 100% oxygen Intubation Type: IV induction Ventilation: Mask ventilation without difficulty Laryngoscope Size: Mac and 3 Grade View: Grade I Tube type: Oral Tube size: 8.0 mm Number of attempts: 1 Airway Equipment and Method: Stylet Placement Confirmation: ETT inserted through vocal cords under direct vision,  breath sounds checked- equal and bilateral and positive ETCO2 Secured at: 22 cm Tube secured with: Tape Dental Injury: Teeth and Oropharynx as per pre-operative assessment

## 2012-07-16 NOTE — Op Note (Signed)
07/16/2012  4:24 PM  PATIENT:  Jason [name]  62 y.o. male  PRE-OPERATIVE DIAGNOSIS:  Congenital spondylolysis L5 , Spondylolisthesis L5 S1 AND L 45 , Recurrent lumbar disc herniation, lumbar stenosis, Lumbar radiculopathy  POST-OPERATIVE DIAGNOSIS: Congenital spondylolysis L5 , Spondylolisthesis L5 S1 AND L 45 , Recurrent lumbar disc herniation, lumbar stenosis, Lumbar radiculopathy  PROCEDURE:  Procedure(s) (LRB) with comments: POSTERIOR LUMBAR FUSION 2 LEVEL (Bilateral) - Redo Lumbar four-five, lumbar five sacral one Decompression/Fusion with L5 Gill procedure and L45 redo and L5S1 discectomies with PLIF L45 and L5S1 with pedicle screw fixation with posterolateral arthrodesis  SURGEON:  Surgeon(s) and Role:    * Cord Wilczynski, MD - Primary    * Gary P Cram, MD - Assisting  PHYSICIAN ASSISTANT:   ASSISTANTS: Poteat, RN   ANESTHESIA:   general  EBL:  Total I/O In: 3490 [I.V.:3100; Blood:140; IV Piggyback:250] Out: 900 [Urine:600; Blood:300]  BLOOD ADMINISTERED:none  DRAINS: (Medium) Hemovact drain(s) in the epidural space with  Suction Open   LOCAL MEDICATIONS USED:  LIDOCAINE   SPECIMEN:  No Specimen  DISPOSITION OF SPECIMEN:  N/A  COUNTS:  YES  TOURNIQUET:  * No tourniquets in log *  DICTATION: DICTATION: Patient is 62-year-old man with  Spondylolysis L5 with spondylolisthesis L5S1 and L45 with prior discectomy L45 Left, stenosis, DDD, radiculopathy L4/5, L5/S1. He has a severe right leg pain and weakness. It was elected to take him to surgery for decompression and fusion at L4/5 and L5/S1 levels.    Procedure: Patient was placed in a prone position on the Jackson table after smooth and uncomplicated induction of general endotracheal anesthesia. His low back was prepped and draped in usual sterile fashion with betadine scrub and DuraPrep. Area of incision was infiltrated with local lidocaine. Incision was made to the lumbodorsal fascia was incised and exposure was  performed of the L4/5, L5/S1 spinous processes laminae facet joint and transverse processes. Intraoperative x-ray was obtained which confirmed correct orientation. A Gill procedure of L5 was performed with disarticulation of the facet joints as well as a total laminectomy of L4  with disarticulation of the facet joints at this level and thorough decompression was performed of both L4, L5 and S1 nerve roots along with the common dural tube. There was densely adherent spondylytic scar material compressing the thecal sac and left  L4 and L5 nerve roots.  Decompression was greater than would be typically performed for simple interbody fusion. A thorough discectomy and preparation of the endplates was performed at both the L4/5 and L5/S1 levels.  The interspaces were packed with small BMP,NexOss bone graft extender, and PEEK cages (9mm at L5/S1 and 9mm at L4/5) . Bone autograft was packed within the interspace bilaterally along with small BMP kit and NexOss bone graft extender. 6 cc of autograft was placed in the L5/S1 interspace along with an 9mm PEEK PLIF spacer. After a thorough decompression with bilateral discectomy at L4/5, bilateral 9mm mm peek cages were packed with BMP and extender and a similar amount of bone autograft  was inserted the interspace and countersunk appropriately. The posterolateral region was extensively decorticated and pedicle probes were placed at L4, L5 and S1 bilaterally. Intraoperative fluoroscopy confirmed correct orientationin the AP and lateral plane. 45 x 6.5 mm pedicle screws were placed at S1 bilaterally and 50 x 6.5 mm screws placed at L5 bilaterally and 50 x 6.5mm screws were placed at L4 bilaterally. Final x-rays demonstrated well-positioned interbody grafts and pedicle screw fixation.50 mm lordotic   rods were placed and locked down in situ and the posterolateral region was packed with the remaining bone graft  on the right and bone graft extender on the left. A medium Hemovac drain  was placed and anchored with a stitch.  Fascia was closed with 1 Vicryl sutures skin edges were reapproximated 2 and 3-0 Vicryl sutures. The wound is dressed with benzoin Steri-Strips Telfa gauze and tape. The patient was extubated in the operating room and taken to recovery in stable satisfactory condition having tolerated the operation well. Counts were correct at the end of the case.    PLAN OF CARE: Admit to inpatient   PATIENT DISPOSITION:  PACU - hemodynamically stable.   Delay start of Pharmacological VTE agent (>24hrs) due to surgical blood loss or risk of bleeding: yes  

## 2012-07-16 NOTE — Anesthesia Postprocedure Evaluation (Signed)
  Anesthesia Post-op Note  Patient: Jason [name]  Procedure(s) Performed: Procedure(s) (LRB) with comments: POSTERIOR LUMBAR FUSION 2 LEVEL (Bilateral) - Redo Lumbar four-five, lumbar five sacral one Decompression/Fusion  Patient Location: PACU  Anesthesia Type:General  Level of Consciousness: awake  Airway and Oxygen Therapy: Patient Spontanous Breathing  Post-op Pain: mild  Post-op Assessment: Post-op Vital signs reviewed, Patient's Cardiovascular Status Stable, Respiratory Function Stable, Patent Airway, No signs of Nausea or vomiting and Pain level controlled  Post-op Vital Signs: stable  Complications: No apparent anesthesia complications

## 2012-07-16 NOTE — Anesthesia Preprocedure Evaluation (Addendum)
Anesthesia Evaluation  Patient identified by MRN, date of birth, ID band Patient awake    Reviewed: Allergy & Precautions, H&P , NPO status , Patient's Chart, lab work & pertinent test results  Airway Mallampati: I TM Distance: >3 FB     Dental  (+) Teeth Intact and Dental Advisory Given   Pulmonary COPDCurrent Smoker,  + rhonchi         Cardiovascular Exercise Tolerance: Good hypertension, Pt. on medications + CAD, + Past MI and + Peripheral Vascular Disease Rhythm:Regular Rate:Normal  Heart Catherization 10-03-11  Impression: 1.  3 vessel native CAD - essentially unchanged from prior cath. 2.  Patent LIMA-LAD, SVG-MARGINAL/RAMUS with good distal runoff and TIMI III flow. 3.  Known occluded SVG-RCA. 4.  EF 50% with basal to mid inferior hypokinesis.      Neuro/Psych Anxiety Right leg pain  Neuromuscular disease    GI/Hepatic GERD-  Medicated and Controlled,  Endo/Other  diabetes, Well Controlled, Type 2  Renal/GU      Musculoskeletal  (+) Arthritis -, Osteoarthritis,    Abdominal   Peds  Hematology   Anesthesia Other Findings   Reproductive/Obstetrics                       Anesthesia Physical Anesthesia Plan  ASA: III  Anesthesia Plan: General   Post-op Pain Management:    Induction: Intravenous  Airway Management Planned: Oral ETT  Additional Equipment:   Intra-op Plan:   Post-operative Plan: Extubation in OR  Informed Consent: I have reviewed the patients History and Physical, chart, labs and discussed the procedure including the risks, benefits and alternatives for the proposed anesthesia with the patient or authorized representative who has indicated his/her understanding and acceptance.   Dental advisory given  Plan Discussed with: CRNA and Surgeon  Anesthesia Plan Comments:        Anesthesia Quick Evaluation

## 2012-07-16 NOTE — Interval H&P Note (Signed)
History and Physical Interval Note:  07/16/2012 7:01 AM  Jason [name]  has presented today for surgery, with the diagnosis of Congenital spondylosis, Spondylolisthesis, Lumbar stenosis, Lumbar radiculopathy  The various methods of treatment have been discussed with the patient and family. After consideration of risks, benefits and other options for treatment, the patient has consented to  Procedure(s) (LRB) with comments: POSTERIOR LUMBAR FUSION 2 LEVEL (N/A) - Redo L4-5 L5-S1 Decompression/Fusion as a surgical intervention .  The patient's history has been reviewed, patient examined, no change in status, stable for surgery.  I have reviewed the patient's chart and labs.  Questions were answered to the patient's satisfaction.     Diahann Guajardo D  Date of Initial H&P: 07/14/2012  History reviewed, patient examined, no change in status, stable for surgery.

## 2012-07-17 LAB — GLUCOSE, CAPILLARY
Glucose-Capillary: 225 mg/dL — ABNORMAL HIGH (ref 70–99)
Glucose-Capillary: 265 mg/dL — ABNORMAL HIGH (ref 70–99)

## 2012-07-17 MED ORDER — NICOTINE 21 MG/24HR TD PT24
21.0000 mg | MEDICATED_PATCH | Freq: Every day | TRANSDERMAL | Status: DC
  Administered 2012-07-17 – 2012-07-20 (×4): 21 mg via TRANSDERMAL
  Filled 2012-07-17 (×4): qty 1

## 2012-07-17 NOTE — Evaluation (Signed)
Physical Therapy Evaluation Patient Details Name: Jason [name] MRN: [id number] DOB: June 21, 1951 Today's Date: 07/17/2012 Time: 7829-5621 PT Time Calculation (min): 26 min  PT Assessment / Plan / Recommendation Clinical Impression  pt s/p PLF L4-S1. Pt moving well although limited safety and functional mobility. Pt will benefit from skilled PT in the acute care setting in order to maximzie functional mobility and safety prior to d/c home    PT Assessment  Patient needs continued PT services    Follow Up Recommendations  No PT follow up;Supervision for mobility/OOB    Does the patient have the potential to tolerate intense rehabilitation      Barriers to Discharge        Equipment Recommendations  None recommended by PT    Recommendations for Other Services     Frequency Min 5X/week    Precautions / Restrictions Precautions Precautions: Back Precaution Booklet Issued: Yes (comment) Precaution Comments: pt educated on 3/3 back precautions Required Braces or Orthoses: Spinal Brace Spinal Brace: Lumbar corset;Applied in sitting position Restrictions Weight Bearing Restrictions: No   Pertinent Vitals/Pain Pain 5/10. PCA encouraged.       Mobility  Bed Mobility Bed Mobility: Rolling Right;Right Sidelying to Sit;Sitting - Scoot to Edge of Bed Rolling Right: 4: Min guard Right Sidelying to Sit: 4: Min guard Sitting - Scoot to [place] of Bed: 4: Min guard Details for Bed Mobility Assistance: VC for safe technique to maintain back precautions. No assistance needed Transfers Transfers: Sit to Stand;Stand to Sit Sit to Stand: 4: Min assist;With upper extremity assist;From bed Stand to Sit: 4: Min assist;With upper extremity assist;To chair/3-in-1 Details for Transfer Assistance: Min assist for support into standing as well as control for sitting. Cues for safe technique to avoid twisting while sitting Ambulation/Gait Ambulation/Gait Assistance: 4: Min guard Ambulation  Distance (Feet): 100 Feet Assistive device: None Ambulation/Gait Assistance Details: Minguard assist for safety. Wide BOS although no loss of balance Gait Pattern: Step-to pattern;Wide base of support;Decreased hip/knee flexion - right;Decreased hip/knee flexion - left Gait velocity: slow gait speed Stairs: No    Shoulder Instructions     Exercises     PT Diagnosis: Difficulty walking;Acute pain  PT Problem List: Decreased activity tolerance;Decreased mobility;Decreased knowledge of use of DME;Decreased safety awareness;Decreased knowledge of precautions;Pain PT Treatment Interventions: DME instruction;Gait training;Stair training;Functional mobility training;Therapeutic activities;Patient/family education   PT Goals Acute Rehab PT Goals PT Goal Formulation: With patient Time For Goal Achievement: 07/24/12 Potential to Achieve Goals: Good Pt will go Supine/Side to Sit: with modified independence PT Goal: Supine/Side to Sit - Progress: Goal set today Pt will go Sit to Stand: with modified independence PT Goal: Sit to Stand - Progress: Goal set today Pt will go Stand to Sit: with modified independence PT Goal: Stand to Sit - Progress: Goal set today Pt will Transfer Bed to Chair/Chair to Bed: with modified independence PT Transfer Goal: Bed to Chair/Chair to Bed - Progress: Goal set today Pt will Ambulate: >150 feet;with supervision;with least restrictive assistive device PT Goal: Ambulate - Progress: Goal set today Pt will Go Up / Down Stairs: Flight;with supervision;with rail(s) PT Goal: Up/Down Stairs - Progress: Goal set today  Visit Information  Last PT Received On: 07/17/12 Assistance Needed: +1 PT/OT Co-Evaluation/Treatment: Yes    Subjective Data  Patient Stated Goal: to get back to work eventually   Prior Functioning  Home Living Lives With: Family Available Help at Discharge: Family;Available 24 hours/day Type of Home: House Home Access: Stairs to enter  Entrance  Stairs-Number of Steps: 2 Entrance Stairs-Rails: Right;Left;Can reach both Home Layout: Two level Alternate Level Stairs-Number of Steps: 10 Alternate Level Stairs-Rails: Left Bathroom Shower/Tub: Tub/shower unit;Walk-in shower Bathroom Toilet: Standard Bathroom Accessibility: Yes How Accessible: Accessible via walker Home Adaptive Equipment: Walker - rolling Prior Function Level of Independence: Independent Able to Take Stairs?: Yes Driving: Yes 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 Communication Communication: No difficulties Dominant Hand: Right    Cognition  Overall Cognitive Status: Appears within functional limits for tasks assessed/performed Arousal/Alertness: Awake/alert Orientation Level: Appears intact for tasks assessed Behavior During Session: [facility] for tasks performed    Extremity/Trunk Assessment Right Lower Extremity Assessment RLE ROM/Strength/Tone: Within functional levels RLE Sensation: WFL - Light Touch Left Lower Extremity Assessment LLE ROM/Strength/Tone: Within functional levels LLE Sensation: WFL - Light Touch   Balance Balance Balance Assessed: Yes Static Standing Balance Static Standing - Balance Support: Right upper extremity supported;During functional activity Static Standing - Level of Assistance: 5: Stand by assistance  End of Session PT - End of Session Equipment Utilized During Treatment: Gait belt;Back brace Activity Tolerance: Patient tolerated treatment well Patient left: in chair;with call bell/phone within reach Nurse Communication: Mobility status  GP     [name] 07/17/2012, 2:07 PM  07/17/2012 [name] DPT PAGER: [phone] OFFICE: [phone]

## 2012-07-17 NOTE — Evaluation (Signed)
Occupational Therapy Evaluation Patient Details Name: Jason [name] MRN: [id number] DOB: 10-09-1950 Today's Date: 07/17/2012 Time: 4098-1191 OT Time Calculation (min): 23 min  OT Assessment / Plan / Recommendation Clinical Impression  Pt s/p PLF L4-S1.  Will benefit from acute OT services to address below problem list in prep for return home.    OT Assessment  Patient needs continued OT Services    Follow Up Recommendations  No OT follow up;Supervision/Assistance - 24 hour    Barriers to Discharge Decreased caregiver support Pt lives with mother but she is unable to provide physical assist.  Equipment Recommendations  Tub/shower seat    Recommendations for Other Services    Frequency  Min 2X/week    Precautions / Restrictions Precautions Precautions: Back Precaution Booklet Issued: Yes (comment) Precaution Comments: pt educated on 3/3 back precautions Required Braces or Orthoses: Spinal Brace Spinal Brace: Lumbar corset;Applied in sitting position Restrictions Weight Bearing Restrictions: No   Pertinent Vitals/Pain See vitals    ADL  Eating/Feeding: Independent;Performed Where Assessed - Eating/Feeding: Chair Grooming: Performed;Wash/dry hands;Supervision/safety Where Assessed - Grooming: Unsupported standing Lower Body Dressing: Performed;Min guard Where Assessed - Lower Body Dressing: Supported sit to Pharmacist, hospital: Performed;Minimal Dentist Method: Sit to Barista: Comfort height toilet Toileting - Clothing Manipulation and Hygiene: Performed;Min guard Where Assessed - Engineer, mining and Hygiene: Standing Equipment Used: Back brace;Gait belt Transfers/Ambulation Related to ADLs: min guard assist with pt pushing IV pole ADL Comments: Pt able to cross ankles over knees to don pants with min guard for safety when standing.      OT Diagnosis: Acute pain;Generalized weakness  OT Problem List:  Decreased strength;Decreased activity tolerance;Decreased knowledge of use of DME or AE;Decreased knowledge of precautions;Pain OT Treatment Interventions: Self-care/ADL training;DME and/or AE instruction;Therapeutic activities;Patient/family education   OT Goals Acute Rehab OT Goals OT Goal Formulation: With patient Time For Goal Achievement: 07/24/12 Potential to Achieve Goals: Good ADL Goals Pt Will Perform Grooming: with modified independence;Standing at sink ADL Goal: Grooming - Progress: Goal set today Pt Will Perform Lower Body Dressing: with modified independence;Sit to stand from chair;Sit to stand from bed ADL Goal: Lower Body Dressing - Progress: Goal set today Pt Will Transfer to Toilet: with modified independence;Ambulation;with DME;Regular height toilet;Maintaining back safety precautions ADL Goal: Toilet Transfer - Progress: Goal set today Pt Will Perform Toileting - Clothing Manipulation: with modified independence;Standing ADL Goal: Toileting - Clothing Manipulation - Progress: Goal set today Pt Will Perform Tub/Shower Transfer: Tub transfer;with modified independence;Ambulation;with DME;Shower seat with back;Maintaining back safety precautions ADL Goal: Tub/Shower Transfer - Progress: Goal set today Miscellaneous OT Goals Miscellaneous OT Goal #1: Pt will perform all functional mobility at mod I level. OT Goal: Miscellaneous Goal #1 - Progress: Goal set today Miscellaneous OT Goal #2: Pt will perform bed mobility at mod I level with HOB flat as precursor for EOB ADLs. OT Goal: Miscellaneous Goal #2 - Progress: Goal set today  Visit Information  Last OT Received On: 07/17/12 Assistance Needed: +1 PT/OT Co-Evaluation/Treatment: Yes    Subjective Data      Prior Functioning     Home Living Lives With: Family Available Help at Discharge: Family;Available 24 hours/day Type of Home: House Home Access: Stairs to enter [employer] of Steps: 2 Entrance  Stairs-Rails: Right;Left;Can reach both Home Layout: Two level Alternate Level Stairs-Number of Steps: 10 Alternate Level Stairs-Rails: Left Bathroom Shower/Tub: Tub/shower unit;Walk-in shower Bathroom Toilet: Standard Bathroom Accessibility: Yes How Accessible: Accessible via walker  Home Adaptive Equipment: Walker - rolling Prior Function Level of Independence: Independent Able to Take Stairs?: Yes Driving: Yes 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 Communication Communication: No difficulties Dominant Hand: Right         Vision/Perception     Cognition  Overall Cognitive Status: Appears within functional limits for tasks assessed/performed Arousal/Alertness: Awake/alert Orientation Level: Appears intact for tasks assessed Behavior During Session: [facility] for tasks performed    Extremity/Trunk Assessment Right Upper Extremity Assessment RUE ROM/Strength/Tone: [facility] for tasks assessed Left Upper Extremity Assessment LUE ROM/Strength/Tone: WFL for tasks assessed Right Lower Extremity Assessment RLE ROM/Strength/Tone: Within functional levels RLE Sensation: WFL - Light Touch Left Lower Extremity Assessment LLE ROM/Strength/Tone: Within functional levels LLE Sensation: WFL - Light Touch     Mobility Bed Mobility Bed Mobility: Rolling Right;Right Sidelying to Sit;Sitting - Scoot to Edge of Bed Rolling Right: 4: Min guard Right Sidelying to Sit: 4: Min guard Sitting - Scoot to [place] of Bed: 4: Min guard Details for Bed Mobility Assistance: VC for safe technique to maintain back precautions. No assistance needed Transfers Transfers: Sit to Stand;Stand to Sit Sit to Stand: 4: Min assist;With upper extremity assist;From bed Stand to Sit: 4: Min assist;With upper extremity assist;To chair/3-in-1 Details for Transfer Assistance: Min assist for support into standing as well as control for sitting. Cues for safe technique to avoid twisting while sitting      Shoulder Instructions     Exercise     Balance Balance Balance Assessed: Yes Static Standing Balance Static Standing - Balance Support: No upper extremity supported;During functional activity Static Standing - Level of Assistance: 5: Stand by assistance;4: Min assist Static Standing - Comment/# of Minutes: close guarding for safety when standing to pull pants up over hips.   End of Session OT - End of Session Equipment Utilized During Treatment: Gait belt;Back brace Activity Tolerance: Patient tolerated treatment well Patient left: in chair;with call bell/phone within reach Nurse Communication: Mobility status  GO    07/17/2012 Cipriano Mile OTR/L Pager [phone] Office [phone]  Cipriano Mile 07/17/2012, 2:17 PM

## 2012-07-17 NOTE — Progress Notes (Signed)
Patient ID: Jason [name], male   DOB: August 28, 1950, 62 y.o.   MRN: [id number] Subjective:  The patient is alert and pleasant. He looks well. He requests a NicoDerm patch.  Objective: Vital signs in last 24 hours: Temp:  [96.9 F (36.1 C)-98.5 F (36.9 C)] 97.6 F (36.4 C) (01/25 0600) Pulse Rate:  [79-81] 79  2022-08-11 1636) Resp:  [14-42] 18  (01/25 0804) BP: (103-137)/(42-56) 114/46 mmHg (01/25 0600) SpO2:  [97 %-100 %] 100 % (01/25 0804)  Intake/Output from previous day: 08/11/22 0701 - 01/25 0700 In: 3490 [I.V.:3100; Blood:140; IV Piggyback:250] Out: 1745 [Urine:1250; Drains:195; Blood:300] Intake/Output this shift: Total I/O In: 360 [P.O.:360] Out: -   Physical exam the patient is alert and oriented. His strength is normal.  Lab Results: No results found for this basename: WBC:2,HGB:2,HCT:2,PLT:2 in the last 72 hours BMET No results found for this basename: NA:2,K:2,CL:2,CO2:2,GLUCOSE:2,BUN:2,CREATININE:2,CALCIUM:2 in the last 72 hours  Studies/Results: Dg Lumbar Spine Complete  07/16/2012  *RADIOLOGY REPORT*  Clinical Data: L4-S1 PLIF redo.  OPERATIVE LUMBAR SPINE - COMPLETE 4+ VIEW  Comparison:  Intraoperative localization image earlier same date 1235 hours.  Findings: Six spot images from the C-arm fluoroscopic device, AP, lateral and oblique views of the lumbar spine, were submitted for interpretation postoperatively.  These were obtained during various stages of the L4-S1 PLIF.  Initial images demonstrate interbody fusion plugs at L4-5 and L5-S1 which are appropriately positioned in the disc spaces on the lateral image.  Later images demonstrate bilateral pedicle screws at L3, L4, and L5.  IMPRESSION: Images obtained in various stages during the L4-S1 PLIF with interbody fusion plugs appropriately positioned in the L4-5 and L5- S1 disc spaces.   Original Report Authenticated By: Hulan Saas, M.D.    Dg Lumbar Spine 1 View  07/16/2012  *RADIOLOGY REPORT*  Clinical Data:  L4-5 and L5-S1 PLIF.  OPERATIVE LUMBAR SPINE - 1 VIEW  Comparison: Operative lumbar spine x-ray 05/28/2010 [place] and lumbar spine x-rays 05/01/2010 [facility].  Findings: Single lateral image obtained at 1235 hours was submitted for interpretation post-operatively.  Localizer devices are present posteriorly at L4, L5, and S1.  Bilateral L5 pars defects with L5- S1 spondylolisthesis again noted.  IMPRESSION: L4, L5, and S1 localized intraoperatively.   Original Report Authenticated By: Hulan Saas, M.D.    Dg C-arm 1-60 Min  07/16/2012  *RADIOLOGY REPORT*  Clinical Data: L4-S1 PLIF redo.  OPERATIVE LUMBAR SPINE - COMPLETE 4+ VIEW  Comparison:  Intraoperative localization image earlier same date 1235 hours.  Findings: Six spot images from the C-arm fluoroscopic device, AP, lateral and oblique views of the lumbar spine, were submitted for interpretation postoperatively.  These were obtained during various stages of the L4-S1 PLIF.  Initial images demonstrate interbody fusion plugs at L4-5 and L5-S1 which are appropriately positioned in the disc spaces on the lateral image.  Later images demonstrate bilateral pedicle screws at L3, L4, and L5.  IMPRESSION: Images obtained in various stages during the L4-S1 PLIF with interbody fusion plugs appropriately positioned in the L4-5 and L5- S1 disc spaces.   Original Report Authenticated By: Hulan Saas, M.D.     Assessment/Plan: Postop day 1: The patient is doing well. I'll add a NicoDerm patch.  LOS: 1 day     Lucille Witts D 07/17/2012, 9:24 AM

## 2012-07-18 LAB — GLUCOSE, CAPILLARY
Glucose-Capillary: 189 mg/dL — ABNORMAL HIGH (ref 70–99)
Glucose-Capillary: 172 mg/dL — ABNORMAL HIGH (ref 70–99)
Glucose-Capillary: 213 mg/dL — ABNORMAL HIGH (ref 70–99)

## 2012-07-18 MED ORDER — HYDROMORPHONE HCL PF 1 MG/ML IJ SOLN: 1.0000 mg | INTRAMUSCULAR | Status: DC | PRN

## 2012-07-18 NOTE — Progress Notes (Signed)
Occupational Therapy Treatment Patient Details Name: Jason [name] MRN: [id number] DOB: 10-19-1950 Today's Date: 07/18/2012 Time: 1191-4782 OT Time Calculation (min): 12 min  OT Assessment / Plan / Recommendation Comments on Treatment Session Pt making good progress.  Will practice tub transfer next session.    Follow Up Recommendations  No OT follow up;Supervision/Assistance - 24 hour    Barriers to Discharge       Equipment Recommendations  Tub/shower seat    Recommendations for Other Services    Frequency Min 2X/week   Plan Discharge plan remains appropriate    Precautions / Restrictions Precautions Precautions: Back Precaution Booklet Issued: Yes (comment) Precaution Comments: pt able to verbalize 2/3 back precautions; reeducated Required Braces or Orthoses: Spinal Brace Spinal Brace: Lumbar corset;Applied in sitting position Restrictions Weight Bearing Restrictions: No   Pertinent Vitals/Pain See vitals    ADL  Lower Body Bathing: Simulated;Supervision/safety Where Assessed - Lower Body Bathing: Unsupported sit to stand Lower Body Dressing: Performed;Supervision/safety Where Assessed - Lower Body Dressing: Unsupported sit to stand Toilet Transfer: Simulated;Supervision/safety Toilet Transfer Method: Sit to stand Equipment Used: Back brace;Gait belt Transfers/Ambulation Related to ADLs: supervision without device. Slow gait but no LOB ADL Comments: Pt able independently demonstrate correct LB dressing technique by crossing ankles over knees. Independent in donning brace.      OT Diagnosis:    OT Problem List:   OT Treatment Interventions:     OT Goals ADL Goals Pt Will Perform Lower Body Dressing: with modified independence;Sit to stand from chair;Sit to stand from bed ADL Goal: Lower Body Dressing - Progress: Progressing toward goals Pt Will Transfer to Toilet: with modified independence;Ambulation;with DME;Regular height toilet;Maintaining back safety  precautions ADL Goal: Toilet Transfer - Progress: Progressing toward goals Miscellaneous OT Goals Miscellaneous OT Goal #1: Pt will perform all functional mobility at mod I level. OT Goal: Miscellaneous Goal #1 - Progress: Progressing toward goals Miscellaneous OT Goal #2: Pt will perform bed mobility at mod I level with HOB flat as precursor for EOB ADLs. OT Goal: Miscellaneous Goal #2 - Progress: Progressing toward goals  Visit Information  Last OT Received On: 07/18/12 Assistance Needed: +1    Subjective Data      Prior Functioning       Cognition  Overall Cognitive Status: Appears within functional limits for tasks assessed/performed Arousal/Alertness: Awake/alert Orientation Level: Appears intact for tasks assessed Behavior During Session: [facility] for tasks performed    Mobility  Shoulder Instructions Bed Mobility Bed Mobility: Rolling Right;Right Sidelying to Sit;Sitting - Scoot to Edge of Bed Rolling Right: 6: Modified independent (Device/Increase time) Right Sidelying to Sit: 6: Modified independent (Device/Increase time);HOB flat Sitting - Scoot to Edge of Bed: 6: Modified independent (Device/Increase time) Transfers Transfers: Sit to Stand;Stand to Sit Sit to Stand: 5: Supervision;From bed       Exercises      Balance     End of Session OT - End of Session Equipment Utilized During Treatment: Back brace Activity Tolerance: Patient tolerated treatment well Patient left: Other (comment) (with PT in hall) Nurse Communication: Mobility status  GO    07/18/2012 Cipriano Mile OTR/L Pager [phone] Office [phone]   Cipriano Mile 07/18/2012, 1:51 PM

## 2012-07-18 NOTE — Clinical Social Work Note (Signed)
CSW consult for SNF. PT/OT report no PT/OT f/u needed. CSW signing off as no other CSW needs identified.  Dellie Burns, MSW, LCSWA [phone] (Weekends 8:00am-4:30pm)

## 2012-07-18 NOTE — Progress Notes (Signed)
Subjective: Patient reports He is feeling better but still has a fair amount back pain legs and not hurting him  Objective: Vital signs in last 24 hours: Temp:  [98 F (36.7 C)-98.8 F (37.1 C)] 98.3 F (36.8 C) (01/26 1005) Pulse Rate:  [64-77] 70  (01/26 1005) Resp:  [18-20] 19  (01/26 1005) BP: (94-125)/(44-64) 94/44 mmHg (01/26 1005) SpO2:  [92 %-100 %] 95 % (01/26 1005)  Intake/Output from previous day: 01/25 0701 - 01/26 0700 In: 360 [P.O.:360] Out: 340 [Drains:340] Intake/Output this shift:    Strength out of 5 wound is clean and dry  Lab Results: No results found for this basename: WBC:2,HGB:2,HCT:2,PLT:2 in the last 72 hours BMET No results found for this basename: NA:2,K:2,CL:2,CO2:2,GLUCOSE:2,BUN:2,CREATININE:2,CALCIUM:2 in the last 72 hours  Studies/Results: Dg Lumbar Spine Complete  07/16/2012  *RADIOLOGY REPORT*  Clinical Data: L4-S1 PLIF redo.  OPERATIVE LUMBAR SPINE - COMPLETE 4+ VIEW  Comparison:  Intraoperative localization image earlier same date 1235 hours.  Findings: Six spot images from the C-arm fluoroscopic device, AP, lateral and oblique views of the lumbar spine, were submitted for interpretation postoperatively.  These were obtained during various stages of the L4-S1 PLIF.  Initial images demonstrate interbody fusion plugs at L4-5 and L5-S1 which are appropriately positioned in the disc spaces on the lateral image.  Later images demonstrate bilateral pedicle screws at L3, L4, and L5.  IMPRESSION: Images obtained in various stages during the L4-S1 PLIF with interbody fusion plugs appropriately positioned in the L4-5 and L5- S1 disc spaces.   Original Report Authenticated By: Hulan Saas, M.D.    Dg Lumbar Spine 1 View  07/16/2012  *RADIOLOGY REPORT*  Clinical Data: L4-5 and L5-S1 PLIF.  OPERATIVE LUMBAR SPINE - 1 VIEW  Comparison: Operative lumbar spine x-ray 05/28/2010 [place] and lumbar spine x-rays 05/01/2010 [facility].  Findings: Single  lateral image obtained at 1235 hours was submitted for interpretation post-operatively.  Localizer devices are present posteriorly at L4, L5, and S1.  Bilateral L5 pars defects with L5- S1 spondylolisthesis again noted.  IMPRESSION: L4, L5, and S1 localized intraoperatively.   Original Report Authenticated By: Hulan Saas, M.D.    Dg C-arm 1-60 Min  07/16/2012  *RADIOLOGY REPORT*  Clinical Data: L4-S1 PLIF redo.  OPERATIVE LUMBAR SPINE - COMPLETE 4+ VIEW  Comparison:  Intraoperative localization image earlier same date 1235 hours.  Findings: Six spot images from the C-arm fluoroscopic device, AP, lateral and oblique views of the lumbar spine, were submitted for interpretation postoperatively.  These were obtained during various stages of the L4-S1 PLIF.  Initial images demonstrate interbody fusion plugs at L4-5 and L5-S1 which are appropriately positioned in the disc spaces on the lateral image.  Later images demonstrate bilateral pedicle screws at L3, L4, and L5.  IMPRESSION: Images obtained in various stages during the L4-S1 PLIF with interbody fusion plugs appropriately positioned in the L4-5 and L5- S1 disc spaces.   Original Report Authenticated By: Hulan Saas, M.D.     Assessment/Plan: Progressive mobilization today DC his PCA he is voiding spontaneously so as Foley is out we'll also consider DC his Hemovac  LOS: 2 days     Jason [name] P 07/18/2012, 10:13 AM

## 2012-07-18 NOTE — Progress Notes (Signed)
Physical Therapy Treatment Patient Details Name: Jason [name] MRN: [id number] DOB: 03/12/1951 Today's Date: 07/18/2012 Time: 2841-3244 PT Time Calculation (min): 19 min  PT Assessment / Plan / Recommendation Comments on Treatment Session  Pt progressing, he is at a supervision mod I level for all mobility. Continue per plan to address safety while maintaining back precautions as well as make sure pt is at a modified independent level for all mobility    Follow Up Recommendations  No PT follow up;Supervision for mobility/OOB     Does the patient have the potential to tolerate intense rehabilitation     Barriers to Discharge        Equipment Recommendations  None recommended by PT    Recommendations for Other Services    Frequency Min 5X/week   Plan Discharge plan remains appropriate;Frequency remains appropriate    Precautions / Restrictions Precautions Precautions: Back Precaution Booklet Issued: Yes (comment) Precaution Comments: pt able to verbalize 2/3 back precautions; reeducated Required Braces or Orthoses: Spinal Brace Spinal Brace: Lumbar corset;Applied in sitting position   Pertinent Vitals/Pain Pain in back 4/10. RN aware.    Mobility  Bed Mobility Bed Mobility: Rolling Right;Right Sidelying to Sit;Sitting - Scoot to Edge of Bed;Sit to Sidelying Right Rolling Right: 6: Modified independent (Device/Increase time) Right Sidelying to Sit: 6: Modified independent (Device/Increase time);HOB flat Sitting - Scoot to Edge of Bed: 6: Modified independent (Device/Increase time) Sit to Sidelying Right: 6: Modified independent (Device/Increase time) Transfers Transfers: Sit to Stand;Stand to Sit Sit to Stand: 5: Supervision;From bed Stand to Sit: 5: Supervision Details for Transfer Assistance: VC for safe hand placement as pt twisting upon sitting Ambulation/Gait Ambulation/Gait Assistance: 5: Supervision Ambulation Distance (Feet): 150 Feet Assistive device:  None Gait Pattern: Step-to pattern;Wide base of support;Decreased hip/knee flexion - right;Decreased hip/knee flexion - left Gait velocity: slow gait speed Stairs: Yes Stairs Assistance: 5: Supervision Stairs Assistance Details (indicate cue type and reason): VC for safe technique leading with strong leg. Supervision for safety Stair Management Technique: One rail Left;Step to pattern;Forwards Number of Stairs: 12     Exercises     PT Diagnosis:    PT Problem List:   PT Treatment Interventions:     PT Goals Acute Rehab PT Goals PT Goal: Supine/Side to Sit - Progress: Met PT Goal: Sit to Stand - Progress: Progressing toward goal PT Goal: Stand to Sit - Progress: Progressing toward goal PT Transfer Goal: Bed to Chair/Chair to Bed - Progress: Progressing toward goal PT Goal: Ambulate - Progress: Met PT Goal: Up/Down Stairs - Progress: Met  Visit Information  Assistance Needed: +1    Subjective Data      Cognition  Overall Cognitive Status: Appears within functional limits for tasks assessed/performed Arousal/Alertness: Awake/alert Orientation Level: Appears intact for tasks assessed Behavior During Session: [facility] for tasks performed    Balance     End of Session PT - End of Session Equipment Utilized During Treatment: Gait belt;Back brace Activity Tolerance: Patient tolerated treatment well Patient left: in bed;with call bell/phone within reach Nurse Communication: Mobility status   GP     Jason [name] 07/18/2012, 3:58 PM

## 2012-07-19 LAB — GLUCOSE, CAPILLARY
Glucose-Capillary: 127 mg/dL — ABNORMAL HIGH (ref 70–99)
Glucose-Capillary: 185 mg/dL — ABNORMAL HIGH (ref 70–99)

## 2012-07-19 MED FILL — Sodium Chloride IV Soln 0.9%: INTRAVENOUS | Qty: 3000 | Status: AC

## 2012-07-19 MED FILL — Heparin Sodium (Porcine) Inj 1000 Unit/ML: INTRAMUSCULAR | Qty: 30 | Status: AC

## 2012-07-19 NOTE — Progress Notes (Signed)
Occupational Therapy Treatment Patient Details Name: Jason [name] MRN: [id number] DOB: Oct 12, 1950 Today's Date: 07/19/2012 Time: 8119-1478 OT Time Calculation (min): 25 min  OT Assessment / Plan / Recommendation Comments on Treatment Session Pt progressing nicely towrad POC/goals, overall, Pt was Mod I level for tub transfer today in ADL bathroom. Recommend supervision at home initially secondary to pt having shower doors and for proper follow through w/ back precautions. Also recommend grab bar for safety in/out of tub as pt reports he has been using a towel rack at home. I donning brace sitting EOB.    Follow Up Recommendations  No OT follow up;Supervision/Assistance - 24 hour    Barriers to Discharge       Equipment Recommendations  Tub/shower seat    Recommendations for Other Services    Frequency Min 2X/week   Plan Discharge plan remains appropriate    Precautions / Restrictions Precautions Precautions: Back Precaution Booklet Issued: Yes (comment) Precaution Comments: pt able to verbalize 2/3 back precautions; reeducated Required Braces or Orthoses: Spinal Brace Spinal Brace: Lumbar corset;Applied in sitting position Restrictions Weight Bearing Restrictions: No   Pertinent Vitals/Pain 6/10 low back pain/surgical pain. Pt reports that he was pre medicated prior to treatment this am.    ADL  Grooming: Performed;Supervision/safety;Wash/dry hands;Wash/dry face Where Assessed - Grooming: Unsupported standing Lower Body Bathing: Simulated;Supervision/safety Where Assessed - Lower Body Bathing: Supported sit to Pharmacist, hospital: Performed;Modified independent;Supervision/safety Toilet Transfer Method: Sit to Barista: Comfort height toilet Toileting - Clothing Manipulation and Hygiene: Performed;Supervision/safety;Modified independent Where Assessed - Engineer, mining and Hygiene: Standing Tub/Shower Transfer:  Engineer, manufacturing Method: Science writer: Other (comment) (Pt has shower doors at home. ) Equipment Used: Back brace Transfers/Ambulation Related to ADLs: Pt reports that he has been Mod I toilet transfers, today w/ Supervision. Moves slowly w/o LOB, while adhering to back precautions ADL Comments: Pt was Mod I level for tub transfer today in ADL bathroom. Recommend supervision at home initially secondary to pt having shower doors and for proper follow through w/ back precautions. Also recommend grab bar for safety in/out of tub as pt reports he has been using a towel rack at home. I donning brace sitting EOB.    OT Diagnosis:    OT Problem List:   OT Treatment Interventions:     OT Goals ADL Goals ADL Goal: Grooming - Progress: Met ADL Goal: Toilet Transfer - Progress: Progressing toward goals ADL Goal: Toileting - Clothing Manipulation - Progress: Progressing toward goals ADL Goal: Tub/Shower Transfer - Progress: Progressing toward goals Miscellaneous OT Goals OT Goal: Miscellaneous Goal #1 - Progress: Met OT Goal: Miscellaneous Goal #2 - Progress: Progressing toward goals  Visit Information  Last OT Received On: 07/19/12    Subjective Data  Subjective: Pt reports 6/10 back pain and states "I've already had pain medicine" Agreeable to functional mobility and ADL tub transfers Patient Stated Goal: Return home when able              Mobility  Shoulder Instructions Bed Mobility Bed Mobility: Rolling Left;Left Sidelying to Sit;Sitting - Scoot to [place] of Bed Rolling Right: 6: Modified independent (Device/Increase time) Rolling Left: 6: Modified independent (Device/Increase time) Left Sidelying to Sit: 6: Modified independent (Device/Increase time) Sitting - Scoot to Edge of Bed: 6: Modified independent (Device/Increase time) Sit to Sidelying Right: 6: Modified independent (Device/Increase time) Details for Bed Mobility  Assistance: Pt Independently stated back precautions after VC Transfers Transfers: Sit to Stand  Sit to Stand: 6: Modified independent (Device/Increase time) Stand to Sit: 6: Modified independent (Device/Increase time) Details for Transfer Assistance: VC's prior to transfers for re enforcement of back precautions & hand placement.              End of Session OT - End of Session Equipment Utilized During Treatment: Back brace Activity Tolerance: Patient tolerated treatment well Patient left: in bed;with call bell/phone within reach Nurse Communication: Other (comment) (Pt request diet coke & coffee w/ splenda)  GO     Charletta Cousin, Kolby Myung Beth Dixon 07/19/2012, 8:40 AM

## 2012-07-19 NOTE — Progress Notes (Signed)
Physical Therapy Treatment Patient Details Name: KWINTON MAAHS MRN: [id number] DOB: [birth year] Today's Date: 07/19/2012 Time: 1010-1023 PT Time Calculation (min): 13 min  PT Assessment / Plan / Recommendation Comments on Treatment Session  Cont's to progress well with mobility.      Follow Up Recommendations  No PT follow up;Supervision for mobility/OOB     Does the patient have the potential to tolerate intense rehabilitation     Barriers to Discharge        Equipment Recommendations  None recommended by PT    Recommendations for Other Services    Frequency Min 5X/week   Plan Discharge plan remains appropriate;Frequency remains appropriate    Precautions / Restrictions Precautions Precautions: Back Precaution Comments: pt able to verbalize 2/3 back precautions; reeducated Required Braces or Orthoses: Spinal Brace Spinal Brace: Lumbar corset;Applied in sitting position Restrictions Weight Bearing Restrictions: No       Mobility  Bed Mobility Bed Mobility: Sit to Sidelying Left Sit to Sidelying Left: 6: Modified independent (Device/Increase time);HOB flat Transfers Transfers: Sit to Stand;Stand to Sit Sit to Stand: 6: Modified independent (Device/Increase time) Stand to Sit: 6: Modified independent (Device/Increase time) Ambulation/Gait Ambulation/Gait Assistance: 5: Supervision Ambulation Distance (Feet): 500 Feet Assistive device: None Ambulation/Gait Assistance Details: Wide BOS but no LOB noted.   Gait Pattern: Step-through pattern;Decreased stride length;Wide base of support Stairs: Yes Stairs Assistance: 5: Supervision Stairs Assistance Details (indicate cue type and reason): Pt required min cueing for technique-- up with strong, down with weak.   Stair Management Technique: One rail Left;Step to pattern;Forwards Number of Stairs: 10  Wheelchair Mobility Wheelchair Mobility: No      PT Goals Acute Rehab PT Goals Time For Goal Achievement:  07/24/12 Potential to Achieve Goals: Good Pt will go Supine/Side to Sit: with modified independence Pt will go Sit to Stand: with modified independence PT Goal: Sit to Stand - Progress: Met Pt will go Stand to Sit: with modified independence PT Goal: Stand to Sit - Progress: Met Pt will Transfer Bed to Chair/Chair to Bed: with modified independence Pt will Ambulate: >150 feet;with supervision;with least restrictive assistive device PT Goal: Ambulate - Progress: Met Pt will Go Up / Down Stairs: Flight;with supervision;with rail(s) PT Goal: Up/Down Stairs - Progress: Met  Visit Information  Last PT Received On: 07/19/12 Assistance Needed: +1    Subjective Data      Cognition  Overall Cognitive Status: Appears within functional limits for tasks assessed/performed Arousal/Alertness: Awake/alert Orientation Level: Appears intact for tasks assessed Behavior During Session: [facility] for tasks performed    Balance     End of Session PT - End of Session Equipment Utilized During Treatment: Gait belt;Back brace Activity Tolerance: Patient tolerated treatment well Patient left: in bed;with call bell/phone within reach Nurse Communication: Mobility status     Verdell Face, [place] [phone] 07/19/2012

## 2012-07-19 NOTE — Progress Notes (Signed)
Subjective: Patient reports "I'm doing good I think. All that stabbing pain in my leg is gone,"  Objective: Vital signs in last 24 hours: Temp:  [97.3 F (36.3 C)-98.5 F (36.9 C)] 97.3 F (36.3 C) (01/27 0500) Pulse Rate:  [61-72] 62  (01/27 0500) Resp:  [18-20] 18  (01/27 0500) BP: (94-121)/(44-74) 103/52 mmHg (01/27 0500) SpO2:  [93 %-100 %] 93 % (01/27 0500)  Intake/Output from previous day: 01/26 0701 - 01/27 0700 In: 360 [P.O.:360] Out: 190 [Drains:190] Intake/Output this shift:    Alert, conversant. Reporting incisional pain, increased with ROM. Good strength BLE. Denies hip/leg pain as pre-op. Incision with intact drsg, no drainage or erythema. Hemovac patent - pulled & drsg reinforced.  Lab Results: No results found for this basename: WBC:2,HGB:2,HCT:2,PLT:2 in the last 72 hours BMET No results found for this basename: NA:2,K:2,CL:2,CO2:2,GLUCOSE:2,BUN:2,CREATININE:2,CALCIUM:2 in the last 72 hours  Studies/Results: No results found.  Assessment/Plan: Improving  LOS: 3 days  Continue to mobilize today in LSO. Monitor pain control (off PCA 12hrs).    Georgiann Cocker 07/19/2012, 9:15 AM

## 2012-07-20 LAB — GLUCOSE, CAPILLARY: Glucose-Capillary: 200 mg/dL — ABNORMAL HIGH (ref 70–99)

## 2012-07-20 NOTE — Discharge Summary (Signed)
Improving nicely.  Leg pain resolved.  Says he will work on stopping smoking. Discharge home.

## 2012-07-20 NOTE — Progress Notes (Signed)
Improving nicely.  Anticipate D/C home.

## 2012-07-20 NOTE — Progress Notes (Signed)
Subjective: Patient reports "I feel fine"  Objective: Vital signs in last 24 hours: Temp:  [97.4 F (36.3 C)-98.3 F (36.8 C)] 98.3 F (36.8 C) (01/28 0219) Pulse Rate:  [64-83] 77  (01/28 0219) Resp:  [16-18] 16  (01/28 0219) BP: (89-144)/(53-65) 89/53 mmHg (01/28 0219) SpO2:  [93 %-99 %] 93 % (01/28 0219) Weight:  [77.111 kg (170 lb)] 77.111 kg (170 lb) (01/27 2000)  Intake/Output from previous day: 01/27 0701 - 01/28 0700 In: 1163 [P.O.:1160; I.V.:3] Out: -  Intake/Output this shift:    Alert, finishing breakfast. Good strength BLE. Incision clean dry, without erythema, swelling, or drainage. No c/o pain at present. (Asymptomatic of 89/53 BP at present. Rechecked : 104/60)  Lab Results: No results found for this basename: WBC:2,HGB:2,HCT:2,PLT:2 in the last 72 hours BMET No results found for this basename: NA:2,K:2,CL:2,CO2:2,GLUCOSE:2,BUN:2,CREATININE:2,CALCIUM:2 in the last 72 hours  Studies/Results: No results found.  Assessment/Plan: Improved  LOS: 4 days  Per Dr. Venetia Maxon, d/c iv, d/c to home after Dr. Venetia Maxon visits. May shower this am. Drsg removed. Pt verbalizes understanding of d/c instructions. He will call office to schedule 3-4 week f/u with Dr. Venetia Maxon. Rx's to chart: Percocet 5/325, Valium 5mg , and Nicoderm CQ 21mg  patches.   Georgiann Cocker 07/20/2012, 7:44 AM

## 2012-07-20 NOTE — Progress Notes (Signed)
D/c instructions reviewed with pt, copy given, hand written script given. Pt packed and ready to go, ride arriving shortly.

## 2012-07-20 NOTE — Discharge Summary (Signed)
Physician Discharge Summary  Patient ID: Jason [name] MRN: [id number] DOB/AGE: 1951-03-06 62 y.o.  Admit date: 07/16/2012 Discharge date: 07/20/2012  Admission Diagnoses: Congenital spondylolysis L5 , Spondylolisthesis L5 S1 AND L 45 , Recurrent lumbar disc herniation, lumbar stenosis, Lumbar radiculopathy    Discharge Diagnoses: Congenital spondylolysis L5 , Spondylolisthesis L5 S1 AND L 45 , Recurrent lumbar disc herniation, lumbar stenosis, Lumbar radiculopathy s/p POSTERIOR LUMBAR FUSION 2 LEVEL (Bilateral) - Redo Lumbar four-five, lumbar five sacral one Decompression/Fusion with L5 Gill procedure and L45 redo and L5S1 discectomies with PLIF L45 and L5S1 with pedicle screw fixation with posterolateral arthrodesis     Active Problems:  * No active hospital problems. *    Discharged Condition: good  Hospital Course: Jason [name] was admitted for surgery on 07-16-12 with Dx Congenital spondylolysis L5 , Spondylolisthesis L5 S1 AND L 45 , Recurrent lumbar disc herniation, lumbar stenosis, Lumbar radiculopathy. Following uncomplicated decompresion and fusion L4-5, L5-S1, he recovered in Neuro PACU & transferred to 4N for nursing and therapies. He has progressed steadily.    Consults: None  Significant Diagnostic Studies: radiology: X-Ray: Intra-operative  Treatments: surgery: POSTERIOR LUMBAR FUSION 2 LEVEL (Bilateral) - Redo Lumbar four-five, lumbar five sacral one Decompression/Fusion with L5 Gill procedure and L45 redo and L5S1 discectomies with PLIF L45 and L5S1 with pedicle screw fixation with posterolateral arthrodesis   Discharge Exam: Blood pressure 89/53, pulse 77, temperature 98.3 F (36.8 C), temperature source Oral, resp. rate 16, height 5\' 9"  (1.753 m), weight 77.111 kg (170 lb), SpO2 93.00%. Alert, finishing breakfast. Good strength BLE. Incision clean dry, without erythema, swelling, or drainage. No c/o pain at present. (Asymptomatic of 89/53 BP at present.  Rechecked : 104/60)    Disposition: 01-Home or Self CareMay shower this am. Drsg removed. Pt verbalizes understanding of d/c instructions. He will call office to schedule 3-4 week f/u with Dr. Venetia Maxon. Rx's to chart: Percocet 5/325, Valium 5mg , and Nicoderm CQ 21mg  patches.      Medication List     As of 07/20/2012  7:50 AM    ASK your doctor about these medications         aspirin 325 MG tablet   Take 325 mg by mouth daily.      doxepin 75 MG capsule   Commonly known as: SINEQUAN   Take 150 mg by mouth at bedtime.      guaiFENesin 100 MG/5ML liquid   Commonly known as: ROBITUSSIN   Take 100 mg by mouth 3 (three) times daily as needed. For cough      isosorbide mononitrate 30 MG 24 hr tablet   Commonly known as: IMDUR   Take 30 mg by mouth daily with supper.      lisinopril 10 MG tablet   Commonly known as: PRINIVIL,ZESTRIL   Take 10 mg by mouth daily with supper.      metFORMIN 500 MG tablet   Commonly known as: GLUCOPHAGE   Take 500-1,000 mg by mouth 2 (two) times daily with a meal. If cbg <150, takes 1 tablet.  If cbg>150, takes 2 tablet      metoprolol 50 MG tablet   Commonly known as: LOPRESSOR   Take 50 mg by mouth 2 (two) times daily. Pt. Reports that he does not always take it two times per day.      naproxen sodium 220 MG tablet   Commonly known as: ANAPROX   Take 220 mg by mouth daily as needed. For pain  nitroGLYCERIN 0.4 MG SL tablet   Commonly known as: NITROSTAT   Place 1 tablet (0.4 mg total) under the tongue every 5 (five) minutes x 3 doses as needed. For chest pain.      oxyCODONE-acetaminophen 10-325 MG per tablet   Commonly known as: PERCOCET   Take 1 tablet by mouth every 8 (eight) hours as needed. For pain      pravastatin 40 MG tablet   Commonly known as: PRAVACHOL   Take 40 mg by mouth daily.      ranitidine 150 MG tablet   Commonly known as: ZANTAC   Take 150 mg by mouth daily as needed. For acid reflux      tiZANidine 4 MG tablet    Commonly known as: ZANAFLEX   Take 4-12 mg by mouth at bedtime. For sleep         Signed: Georgiann Cocker 07/20/2012, 7:50 AM

## 2012-07-20 NOTE — Progress Notes (Signed)
   CARE MANAGEMENT NOTE 07/20/2012  Patient:  Jason [name], Jason [name]   Account Number:  [id number]  Date Initiated:  07/19/2012  Documentation initiated by:  [facility]  Subjective/Objective Assessment:   admitted postop L4-S1 PLIF     Action/Plan:   PT/OT eval-recommended tub/shower seat   Anticipated DC Date:  07/22/2012   Anticipated DC Plan:  HOME/SELF CARE      DC Planning Services  CM consult      Choice offered to / List presented to:             Status of service:  Completed, signed off Medicare Important Message given?   (If response is "NO", the following Medicare IM given date fields will be blank) Date Medicare IM given:   Date Additional Medicare IM given:    Discharge Disposition:  HOME/SELF CARE  Per UR Regulation:  Reviewed for med. necessity/level of care/duration of stay  If discussed at Long Length of Stay Meetings, dates discussed:    Comments:  07/20/2012 0940 NCM spoke to patient and no needs at this time. Gave permission to communicate and fax necessary paperwork to [employer].  No HH PT/OT recommended. Called and left voice mail for [facility]. Will fax dc summary to CW. Isidoro Donning RN CCM Case Mgmt phone [phone]   worker's comp CM-Shelia Ward [phone] fax [phone]

## 2012-08-31 ENCOUNTER — Encounter: Payer: Self-pay | Admitting: Cardiology

## 2012-09-16 ENCOUNTER — Encounter: Payer: Self-pay | Admitting: Cardiovascular Disease

## 2012-09-27 ENCOUNTER — Telehealth: Payer: Self-pay

## 2012-09-27 NOTE — Telephone Encounter (Signed)
Pt is returning call from earlier he missed.

## 2012-09-27 NOTE — Telephone Encounter (Signed)
Outpatient limb no I will check with our DMD specialist here and find out if I can fill out these forms. When I see him on Friday I will be able to take care of it.

## 2012-09-27 NOTE — Telephone Encounter (Signed)
Pt came in this morning for a dmv pe w/dr daub. Had forms dated from 2008 that he had filled out himself (in the dr portion of the pe) thinking he was supposed to answer those questions. Also brought a written letter for dr Cleta Alberts about his situation. Spoke to dr Cleta Alberts about what he wanted to do about this and he asked that i leave the forms in his box to review with [place] because of the date on the pe. Can he fill out a dmv pe that's dated from 2008 and sign it with the current date? Asked pt to bring in a current form and this afternoon he brought in the same 2008 form that was blank instead of filled out like the other. Pt stated this morning that he has lots of copies of the blank original from 2008. Pt is returning to see dr Cleta Alberts on Friday. States he needs to be cleared so he can drive his mother (who lives in assisted living) to her dr appts.   Pt best: [phone]  bf

## 2012-09-27 NOTE — Telephone Encounter (Signed)
No, he would have to see the patient. He will review the forms and discuss with patient. This will be reviewed with patient, and patient will be examined on Friday.

## 2012-10-01 ENCOUNTER — Ambulatory Visit (INDEPENDENT_AMBULATORY_CARE_PROVIDER_SITE_OTHER): Payer: Self-pay | Admitting: Emergency Medicine

## 2012-10-01 ENCOUNTER — Encounter: Payer: Self-pay | Admitting: Emergency Medicine

## 2012-10-01 VITALS — BP 162/84 | HR 88 | Temp 97.4°F | Resp 16 | Ht 68.5 in | Wt 158.4 lb

## 2012-10-01 DIAGNOSIS — I25810 Atherosclerosis of coronary artery bypass graft(s) without angina pectoris: Secondary | ICD-10-CM

## 2012-10-01 DIAGNOSIS — Z0000 Encounter for general adult medical examination without abnormal findings: Secondary | ICD-10-CM

## 2012-10-01 DIAGNOSIS — E119 Type 2 diabetes mellitus without complications: Secondary | ICD-10-CM

## 2012-10-01 DIAGNOSIS — F1911 Other psychoactive substance abuse, in remission: Secondary | ICD-10-CM | POA: Insufficient documentation

## 2012-10-01 DIAGNOSIS — F1910 Other psychoactive substance abuse, uncomplicated: Secondary | ICD-10-CM

## 2012-10-01 MED ORDER — METFORMIN HCL 500 MG PO TABS: 500.0000 mg | ORAL_TABLET | Freq: Two times a day (BID) | ORAL | Status: DC

## 2012-10-01 NOTE — Progress Notes (Signed)
  Subjective:    Patient ID: Jason [name], male    DOB: 03-25-1951, 62 y.o.   MRN: [id number]  HPI patient enters for completion of his DMV forms. Apparently in 2005 he was admitted to behavioral health for a history of substance abuse. At that time there is a history of narcotic use alcohol abuse and benzodiazepine. His license rose revoked in 2008. He is here requesting completion of this forms. He states he last used cocaine about 7 months ago. He is status post recent back surgery in January of this year and is currently on oxycodone and Valium following his surgery. He has a cardiologist he sees Dr.Croitoru. He was cleared cardiology-wise for his recent back surgery. Back surgery was performed by Dr. Venetia Maxon and he has done very well since that time.    Review of Systems     Objective:   Physical Exam patient is alert and cooperative in no distress. His neck is supple. His chest is clear. His heart is a regular rate without murmurs. He has a midline sternotomy scar. Abdomen is soft nontender rectal exam reveals a small prostate hemmosure was done.  Results for orders placed in visit on 10/01/12  IFOBT (OCCULT BLOOD)      Result Value Range   IFOBT Negative    POCT GLYCOSYLATED HEMOGLOBIN (HGB A1C)      Result Value Range   Hemoglobin A1C 6.5          Assessment & Plan:  Forms were completed copy to be put in his chart I did go ahead and do a drug screen here to test for cocaine he does have prescriptions for Percocet and valium.FAX [phone]  Phone [phone]. DMV Office

## 2012-10-21 NOTE — Telephone Encounter (Signed)
Yes - thank you

## 2012-10-21 NOTE — Telephone Encounter (Signed)
This was still in my inbox. Has this been taken care of and pt notified?  Thanks bf

## 2012-10-30 ENCOUNTER — Ambulatory Visit: Payer: Self-pay | Admitting: Family Medicine

## 2012-10-30 ENCOUNTER — Telehealth: Payer: Self-pay

## 2012-10-30 VITALS — BP 132/66 | HR 55 | Temp 97.8°F | Resp 18 | Wt 164.0 lb

## 2012-10-30 DIAGNOSIS — F410 Panic disorder [episodic paroxysmal anxiety] without agoraphobia: Secondary | ICD-10-CM

## 2012-10-30 DIAGNOSIS — J309 Allergic rhinitis, unspecified: Secondary | ICD-10-CM

## 2012-10-30 MED ORDER — ALPRAZOLAM 0.5 MG PO TABS: ORAL_TABLET | ORAL | Status: DC

## 2012-10-30 MED ORDER — PREDNISONE 20 MG PO TABS: ORAL_TABLET | ORAL | Status: DC

## 2012-10-30 NOTE — Patient Instructions (Addendum)
Continue taking the plain Mucinex.  Take the prednisone as directed. It will probably raise her blood sugar some, so be especially careful about sweets and starches in your diet.  Drink lots of fluids as you have been  Take over-the-counter or loratadine (Claritin) one daily  Use the alprazolam only if needed for panic disorder

## 2012-10-30 NOTE — Progress Notes (Signed)
Subjective: For the past 10 days the patient has been having problems with a dry tightness in his sinuses and in his chest. He says only way he can get his self cleared out is to get into the shower and breathing moist ear. He is not able to blow out her cough up any thing. This morning his chest was tight when it. He took a nitroglycerin also. He has checked his blood sugar, and today it was 128. He does smoke one pack see her in today. He is currently out of work on workers comp disability due to a back injury.  He does have a history of multiple skin cancers in the past.  History of panic disorder. He is afraid that he will need some Xanax when on the prednisone.  Objective: Fair skinned man in no acute distress this time. Nose clear throat clear neck supple without significant nodes. TMs normal. He does have some otalgia but they look fine. His chest is clear to auscultation. Heart regular. On the right cheek adjacent to the nose there is a 5 or 6 mm diameter area that would appear to be a early skin cancer of a basal cell appearance.    Assessment: Sinusitis/bronchitis, possibly allergic. Basal cell skin cancer right cheek Cough Tobacco abuse  diabetes, controlled  Plan: Advised him to see a dermatologist as soon as he can. He does not have insurance, but he needs to figure that out.  Will give her a brief course of prednisone even though he is diabetic. I believe he can tolerate it since his sugars have been good.  Take loratadine one daily  Take Mucinex plain

## 2012-11-17 ENCOUNTER — Other Ambulatory Visit: Payer: Self-pay | Admitting: Emergency Medicine

## 2012-12-18 ENCOUNTER — Ambulatory Visit: Payer: Self-pay | Admitting: Family Medicine

## 2012-12-18 VITALS — BP 99/58 | HR 86 | Temp 97.9°F | Resp 16 | Ht 69.5 in | Wt 160.0 lb

## 2012-12-18 DIAGNOSIS — L089 Local infection of the skin and subcutaneous tissue, unspecified: Secondary | ICD-10-CM

## 2012-12-18 MED ORDER — DOXYCYCLINE HYCLATE 100 MG PO TABS: 100.0000 mg | ORAL_TABLET | Freq: Two times a day (BID) | ORAL | Status: DC

## 2012-12-18 NOTE — Patient Instructions (Signed)
Take the antibiotic one twice daily beginning tonight. Take it with food, but not with dairy products which will inhibit its absorption.  While on the doxycycline be cautious about sun  If the spot is getting bigger or more painful or you feel a larger knot there please return

## 2012-12-18 NOTE — Progress Notes (Signed)
Subjective: Patient been doing a lot of stuff out in his yard and garden. He couple days ago he noticed a bite on the back of his right hip. He thinks he may have gotten spider bit. It itches a lot and is a little tender.  Objective: He has a black excoriated spot on the crease of his right hip posteriorly. There is about a 1 x 2 CM radius of erythema. Only minimal induration. Does not feel like a full-blown abscess yet. He has some ecchymosis just below it where he is excoriated it.  Assessment: Insect bite right buttock, possible early abscess. Do not think this is a MRSA type infection. Patient is diabetic.  Plan: Doxycycline 100 twice a day. Return if in all worse.

## 2013-05-02 ENCOUNTER — Ambulatory Visit: Payer: Self-pay | Admitting: Emergency Medicine

## 2013-05-02 VITALS — BP 154/88 | HR 89 | Temp 97.7°F | Resp 18 | Ht 68.25 in | Wt 164.2 lb

## 2013-05-02 DIAGNOSIS — F410 Panic disorder [episodic paroxysmal anxiety] without agoraphobia: Secondary | ICD-10-CM

## 2013-05-02 DIAGNOSIS — C44319 Basal cell carcinoma of skin of other parts of face: Secondary | ICD-10-CM

## 2013-05-02 DIAGNOSIS — J309 Allergic rhinitis, unspecified: Secondary | ICD-10-CM

## 2013-05-02 DIAGNOSIS — C44300 Unspecified malignant neoplasm of skin of unspecified part of face: Secondary | ICD-10-CM

## 2013-05-02 MED ORDER — ALPRAZOLAM 0.5 MG PO TABS: ORAL_TABLET | ORAL | Status: DC

## 2013-05-02 MED ORDER — AMOXICILLIN 875 MG PO TABS: 875.0000 mg | ORAL_TABLET | Freq: Two times a day (BID) | ORAL | Status: DC

## 2013-05-02 MED ORDER — PREDNISONE 20 MG PO TABS: ORAL_TABLET | ORAL | Status: DC

## 2013-05-02 NOTE — Progress Notes (Signed)
Subjective:    Patient ID: Jason [name], male    DOB: [birth year], 62 y.o.   MRN: [id number] This chart was scribed for Collene Gobble, MD by Valera Castle, ED Scribe. This patient was seen in room 4 and the patient's care was started at 12:36 PM.  HPI KHALIQ TURAY is a 62 y.o. male who presents to the [facility] complaining of gradual sinus congestion, headache, sore throat, and cough, productive of minimal, clear sputum, onset a few days ago. He reports associated facial pain, nausea, and emesis. He reports excessive sneezing in the evenings and having a dental problem. He reports an allergy to sulfa antibiotics.   He states he has not seen his cardiologist in a while. He states he has continued to stay off of cocaine use. He reports h/o smoking.  He reports that his back doctor released him. - Dr. Venetia Maxon He states he is trying to get a hold of a good lawyer so that he can get on disability. He reports being told he was at a 30% disability rating.   He reports he still does not have a his driver's license yet.   Patient Active Problem List   Diagnosis Date Noted   Substance abuse in remission 10/01/2012   Radiculitis 02/10/2012   CAD, CABG Feb 2011, cath 10/03/11-medical Rx 10/03/2011   PVD, hx Rt femoral endarterectomy 2004 10/03/2011   Chest pain at rest 10/02/2011   Tobacco abuse disorder 10/02/2011   DM (diabetes mellitus) 10/02/2011   HTN (hypertension) 10/02/2011   Hyperlipidemia 10/02/2011   Past Medical History  Diagnosis Date   Coronary artery disease     CABG 2/01, cath 4/13 - med Rx   Hypertension     type 2 NIDDM   Diabetes mellitus    Arthritis    GERD (gastroesophageal reflux disease)    High cholesterol    Myocardial infarct     x4 last 10 yrs   Anxiety     off xanax  and paxil since 3/13   COPD (chronic obstructive pulmonary disease)    Pneumonia     hx   Cancer     tumor basal cell rem from lft arm   Smoker    Anxiety    RBBB  (right bundle branch block with left posterior fascicular block)    Depression    Substance abuse    Cataract    Heart attack    Past Surgical History  Procedure Laterality Date   Coronary stent placement     Femoral artery - femoral artery bypass graft Right 2004    femoral enarterectomy   US extremity*l*      lft arm tumor removed    Cardiac catheterization  4/12 and 4/13    Medical Rx   Coronary angioplasty  Jan 2004    RCA   Back surgery  Jan 2014    Dr Venetia Maxon   Eye surgery      cat bil   Coronary artery bypass graft  07/24/2009    L-LAD, SVG-RI/OM, SVG-PDA   Allergies  Allergen Reactions   Sulfa Antibiotics Nausea And Vomiting and Other (See Comments)    Also headaches    Prior to Admission medications   Medication Sig Start Date End Date Taking? Authorizing Provider  ALPRAZolam Prudy Feeler) 0.5 MG tablet Use one pill twice daily only if needed for panic attacks 10/30/12  Yes Peyton Najjar, MD  aspirin 325 MG tablet Take 325 mg by mouth daily.  Yes Historical Provider, MD  isosorbide mononitrate (IMDUR) 30 MG 24 hr tablet TAKE 1 TABLET BY MOUTH EVERY DAY 11/17/12  Yes Collene Gobble, MD  lisinopril (PRINIVIL,ZESTRIL) 10 MG tablet Take 10 mg by mouth daily with supper.    Yes Historical Provider, MD  metFORMIN (GLUCOPHAGE) 500 MG tablet Take 1-2 tablets (500-1,000 mg total) by mouth 2 (two) times daily with a meal. If cbg <150, takes 1 tablet.  If cbg>150, takes 2 tablet 10/01/12  Yes Collene Gobble, MD  metoprolol (LOPRESSOR) 50 MG tablet TAKE 1 TABLET BY MOUTH TWICE A DAY 11/17/12  Yes Collene Gobble, MD  naproxen sodium (ANAPROX) 220 MG tablet Take 220 mg by mouth daily as needed. For pain   Yes Historical Provider, MD  nitroGLYCERIN (NITROSTAT) 0.4 MG SL tablet Place 1 tablet (0.4 mg total) under the tongue every 5 (five) minutes x 3 doses as needed. For chest pain. 10/04/11  Yes Luke K Kilroy, PA-C  oxyCODONE-acetaminophen (PERCOCET) 10-325 MG per tablet Take 1 tablet  by mouth every 8 (eight) hours as needed. For pain   Yes Historical Provider, MD  pravastatin (PRAVACHOL) 40 MG tablet Take 40 mg by mouth daily.   Yes Historical Provider, MD  ranitidine (ZANTAC) 150 MG tablet Take 150 mg by mouth daily as needed. For acid reflux   Yes Historical Provider, MD  tiZANidine (ZANAFLEX) 4 MG tablet Take 4-12 mg by mouth at bedtime. For sleep   Yes Historical Provider, MD  doxepin (SINEQUAN) 75 MG capsule Take 150 mg by mouth at bedtime.     Historical Provider, MD  doxycycline (VIBRA-TABS) 100 MG tablet Take 1 tablet (100 mg total) by mouth 2 (two) times daily. 12/18/12   Peyton Najjar, MD  predniSONE (DELTASONE) 20 MG tablet Take daily for 2 days, then one daily for 2 days, then one half daily 10/30/12   Peyton Najjar, MD    Review of Systems  HENT: Positive for congestion, dental problem, sinus pressure, sneezing and sore throat.   Respiratory: Positive for cough (productive).   Gastrointestinal: Positive for nausea and vomiting.  Neurological: Positive for headaches.      Objective:   Physical Exam Nursing note and vitals reviewed. Constitutional: He is oriented to person, place, and time. He appears well-developed and well-nourished. No distress.  HENT:  Head: Normocephalic and atraumatic.  Eyes: EOM are normal. Pupils are equal, round, and reactive to light.  Neck: Neck supple. No tracheal deviation present.  Cardiovascular: Normal rate, regular rhythm and normal heart sounds.  Exam reveals no gallop and no friction rub.   No murmur heard. Pulmonary/Chest: Effort normal and breath sounds normal. No respiratory distress. He has no wheezes. He has no rales.  Abdominal: Soft. Bowel sounds are normal. There is no tenderness. There is no rebound and no guarding.  Musculoskeletal: Normal range of motion.  Neurological: He is alert and oriented to person, place, and time.  Skin: Skin is warm and dry.  Psychiatric: He has a normal mood and affect. His  behavior is normal.   Triage Vitals: BP 154/88   Pulse 89   Temp(Src) 97.7 F (36.5 C) (Oral)   Resp 18   Ht 5' 8.25" (1.734 m)   Wt 164 lb 3.2 oz (74.481 kg)   BMI 24.77 kg/m2   SpO2 98%     Assessment & Plan:   Patient given amoxicillin to take for 10 days along with a short taper of prednisone and that this  has helped him with his sinus symptoms in the past . Advised him to see a dentist. I am reviewing papers he left regarding a DMV reevaluation    I personally performed the services described in this documentation, which was scribed in my presence. The recorded information has been reviewed and is accurate.

## 2013-05-03 ENCOUNTER — Telehealth: Payer: Self-pay | Admitting: Radiology

## 2013-05-03 NOTE — Telephone Encounter (Signed)
I got a hand written note from you, indicating patient will need to return to clinic, but these papers are dated 10/12/12. This issue should be resolved. Patient was seen 10/01/12 and you indicate information was sent. I think this is extra copy. Where did they come from? They have appeared on my desk now.

## 2013-05-03 NOTE — Telephone Encounter (Signed)
These are  regarding a DMV certification to get his license. I cannot find any trace of the original forms from 10/01/2012  if you can check our  records and see if we can find them that would be great otherwise he needs to return to clinic to Retake the history and complete the forms.

## 2013-05-03 NOTE — Telephone Encounter (Signed)
I think the originals were sent  to the [facility] office. They did not receive all the forms he needed. I do not know if he can contact the DMV and a fax [place] a copy of forms they already have or whether we need to start from scratch .

## 2013-05-03 NOTE — Telephone Encounter (Signed)
Do you think these are the originals?

## 2013-05-04 NOTE — Telephone Encounter (Signed)
He has gotten another letter, he will check and see if he has the old paper work. Did you get the results of the drug screen you ordered in April? I do not see it in epic.

## 2013-05-05 NOTE — Telephone Encounter (Signed)
Noted, but I still think it would be helpful for [place] to know these results. Discussed with Raynelle Fanning, she will get these results for [place] to review. Patient is checking to see if he has copies of the papers from the visit in April

## 2013-05-05 NOTE — Telephone Encounter (Signed)
Called again. To advise, asked him to return to clinic for forms. Left detailed message.

## 2013-05-05 NOTE — Telephone Encounter (Signed)
He did have a drug screen in April. It has been 7 months now and he would have to have another drug screen anyway. Before I could complete his forms.

## 2013-05-10 ENCOUNTER — Telehealth: Payer: Self-pay

## 2013-05-10 NOTE — Telephone Encounter (Signed)
Patient dropped off paper regarding DMV. States it is for Amy L or Dr. Cleta Alberts. Placed on Amy's desk  [phone]

## 2013-05-10 NOTE — Telephone Encounter (Signed)
Envelope your box in dr. Prince Rome.

## 2013-06-10 ENCOUNTER — Other Ambulatory Visit: Payer: Self-pay | Admitting: Emergency Medicine

## 2013-06-23 HISTORY — PX: CORONARY ANGIOGRAM: SHX5786

## 2013-07-07 ENCOUNTER — Observation Stay (HOSPITAL_COMMUNITY)
Admission: EM | Admit: 2013-07-07 | Discharge: 2013-07-09 | Disposition: A | Discharge status: Home / Self Care | Payer: No Typology Code available for payment source | Attending: Dermatology | Admitting: Dermatology

## 2013-07-07 ENCOUNTER — Emergency Department (HOSPITAL_COMMUNITY): Payer: No Typology Code available for payment source

## 2013-07-07 DIAGNOSIS — F410 Panic disorder [episodic paroxysmal anxiety] without agoraphobia: Secondary | ICD-10-CM | POA: Insufficient documentation

## 2013-07-07 DIAGNOSIS — I10 Essential (primary) hypertension: Secondary | ICD-10-CM

## 2013-07-07 DIAGNOSIS — I2510 Atherosclerotic heart disease of native coronary artery without angina pectoris: Secondary | ICD-10-CM

## 2013-07-07 DIAGNOSIS — F17200 Nicotine dependence, unspecified, uncomplicated: Secondary | ICD-10-CM | POA: Insufficient documentation

## 2013-07-07 DIAGNOSIS — J449 Chronic obstructive pulmonary disease, unspecified: Secondary | ICD-10-CM | POA: Insufficient documentation

## 2013-07-07 DIAGNOSIS — I200 Unstable angina: Secondary | ICD-10-CM | POA: Insufficient documentation

## 2013-07-07 DIAGNOSIS — R079 Chest pain, unspecified: Secondary | ICD-10-CM

## 2013-07-07 DIAGNOSIS — E119 Type 2 diabetes mellitus without complications: Secondary | ICD-10-CM

## 2013-07-07 DIAGNOSIS — Z981 Arthrodesis status: Secondary | ICD-10-CM | POA: Insufficient documentation

## 2013-07-07 DIAGNOSIS — I25810 Atherosclerosis of coronary artery bypass graft(s) without angina pectoris: Secondary | ICD-10-CM | POA: Insufficient documentation

## 2013-07-07 DIAGNOSIS — Z9861 Coronary angioplasty status: Secondary | ICD-10-CM | POA: Insufficient documentation

## 2013-07-07 DIAGNOSIS — K219 Gastro-esophageal reflux disease without esophagitis: Secondary | ICD-10-CM | POA: Insufficient documentation

## 2013-07-07 DIAGNOSIS — Z7982 Long term (current) use of aspirin: Secondary | ICD-10-CM | POA: Insufficient documentation

## 2013-07-07 DIAGNOSIS — F329 Major depressive disorder, single episode, unspecified: Secondary | ICD-10-CM | POA: Insufficient documentation

## 2013-07-07 DIAGNOSIS — F3289 Other specified depressive episodes: Secondary | ICD-10-CM | POA: Insufficient documentation

## 2013-07-07 DIAGNOSIS — F411 Generalized anxiety disorder: Secondary | ICD-10-CM | POA: Insufficient documentation

## 2013-07-07 DIAGNOSIS — E785 Hyperlipidemia, unspecified: Secondary | ICD-10-CM

## 2013-07-07 DIAGNOSIS — F1911 Other psychoactive substance abuse, in remission: Secondary | ICD-10-CM | POA: Diagnosis present

## 2013-07-07 DIAGNOSIS — R0789 Other chest pain: Principal | ICD-10-CM | POA: Insufficient documentation

## 2013-07-07 DIAGNOSIS — Z8249 Family history of ischemic heart disease and other diseases of the circulatory system: Secondary | ICD-10-CM | POA: Insufficient documentation

## 2013-07-07 DIAGNOSIS — Z720 Tobacco use: Secondary | ICD-10-CM

## 2013-07-07 DIAGNOSIS — D72829 Elevated white blood cell count, unspecified: Secondary | ICD-10-CM | POA: Insufficient documentation

## 2013-07-07 DIAGNOSIS — I4510 Unspecified right bundle-branch block: Secondary | ICD-10-CM | POA: Insufficient documentation

## 2013-07-07 DIAGNOSIS — J4489 Other specified chronic obstructive pulmonary disease: Secondary | ICD-10-CM | POA: Insufficient documentation

## 2013-07-07 DIAGNOSIS — I252 Old myocardial infarction: Secondary | ICD-10-CM | POA: Insufficient documentation

## 2013-07-07 LAB — BASIC METABOLIC PANEL
BUN: 11 mg/dL (ref 6–23)
CALCIUM: 10 mg/dL (ref 8.4–10.5)
CO2: 20 meq/L (ref 19–32)
Chloride: 95 mEq/L — ABNORMAL LOW (ref 96–112)
Creatinine, Ser: 1.07 mg/dL (ref 0.50–1.35)
GFR calc Af Amer: 85 mL/min — ABNORMAL LOW (ref >90)
GFR, EST NON AFRICAN AMERICAN: 74 mL/min — AB (ref >90)
GLUCOSE: 176 mg/dL — AB (ref 70–99)
POTASSIUM: 3.9 meq/L (ref 3.7–5.3)
SODIUM: 134 meq/L — AB (ref 137–147)

## 2013-07-07 LAB — POCT I-STAT TROPONIN I: Troponin i, poc: 0 ng/mL (ref 0.00–0.08)

## 2013-07-07 LAB — CBC
HCT: 44.3 % (ref 39.0–52.0)
HEMOGLOBIN: 16 g/dL (ref 13.0–17.0)
MCH: 34.6 pg — ABNORMAL HIGH (ref 26.0–34.0)
MCHC: 36.1 g/dL — ABNORMAL HIGH (ref 30.0–36.0)
MCV: 95.7 fL (ref 78.0–100.0)
PLATELETS: 233 10*3/uL (ref 150–400)
RBC: 4.63 MIL/uL (ref 4.22–5.81)
RDW: 12.7 % (ref 11.5–15.5)
WBC: 13 10*3/uL — ABNORMAL HIGH (ref 4.0–10.5)

## 2013-07-07 LAB — PRO B NATRIURETIC PEPTIDE: PRO B NATRI PEPTIDE: 77.9 pg/mL (ref 0–125)

## 2013-07-07 NOTE — ED Provider Notes (Signed)
CSN: [id number]     Arrival date & time 07/07/13  2025 History   First MD Initiated Contact with Patient 07/07/13 2103     Chief Complaint  Patient presents with  . Chest Pain    HPI: Ms. Holness is a 63 yo M with history of CAD s/p CABG, HTN, DM, GERD and COPD who presents with chest pain, left shoulder pain, cough, congestion. Symptoms started one month ago with nasal and chest congestion. Coughing up clear sputum mostly. He has had intermittent left sided chest pain, described as tightness, radiates to his left shoulder, associated with intermittent nausea and vomiting. For the last couple of days he has noted worsening exercise tolerance, most frequent left sided chest pain which radiates down his left arm. He does endorse intermittent shortness of breath as well. 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 He states these symptoms are similar to when he had an MI in the past.    Past Medical History  Diagnosis Date  . Coronary artery disease     CABG 2/01, cath 4/13 - med Rx  . Hypertension     type 2 NIDDM  . Diabetes mellitus   . Arthritis   . GERD (gastroesophageal reflux disease)   . High cholesterol   . Myocardial infarct     x4 last 10 yrs  . Anxiety     off xanax  and paxil since 3/13  . COPD (chronic obstructive pulmonary disease)   . Pneumonia     hx  . Cancer     tumor basal cell rem from lft arm  . Smoker   . Anxiety   . 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 Depression   . Substance abuse   . Cataract   . Heart attack    Past Surgical History  Procedure Laterality Date  . Coronary stent placement    . Femoral artery - femoral artery bypass graft Right 2004    femoral enarterectomy  . US extremity*l*      lft arm tumor removed   . Cardiac catheterization  4/12 and 4/13    Medical Rx  . Coronary angioplasty  Jan 2004    RCA  . Back surgery  Jan 2014    Dr Vertell Limber  . Eye surgery       cat bil  . Coronary artery bypass graft  07/24/2009    L-LAD, SVG-RI/OM, SVG-PDA   Family History  Problem Relation Age of Onset  . Arthritis Mother   . Heart disease Father    History  Substance Use Topics  . Smoking status: Current Every Day Smoker -- 1.00 packs/day for 40 years    Types: Cigarettes  . Smokeless tobacco: Not on file  . Alcohol Use: Yes     Comment: socially    Review of Systems  Constitutional: Positive for activity change (decreased), appetite change (decreased) and fatigue. Negative for fever and chills.  HENT: Positive for congestion (nasal and chest congestion).   Eyes: Negative for photophobia and visual disturbance.  Respiratory: Positive for cough and shortness of breath.   Cardiovascular: Positive for chest pain. Negative for leg swelling.  Gastrointestinal: Positive for nausea and vomiting. Negative for abdominal pain, diarrhea and constipation.  Genitourinary: Negative for dysuria, frequency and decreased urine volume.  Musculoskeletal: Negative for arthralgias, back pain, gait problem and myalgias.  Skin: Negative for color change and wound.  Neurological: Positive for  headaches. Negative for dizziness, syncope and light-headedness.  Psychiatric/Behavioral: Negative for confusion and agitation.  All other systems reviewed and are negative.    Allergies  Sulfa antibiotics  Home Medications   Current Outpatient Rx  Name  Route  Sig  Dispense  Refill  . ALPRAZolam (XANAX) 0.5 MG tablet      Use one pill twice daily only if needed for panic attacks   20 tablet   0   . aspirin 325 MG tablet   Oral   Take 325 mg by mouth daily.         Marland Kitchen doxepin (SINEQUAN) 75 MG capsule   Oral   Take 150 mg by mouth at bedtime.          . isosorbide mononitrate (IMDUR) 30 MG 24 hr tablet      TAKE 1 TABLET BY MOUTH EVERY DAY   90 tablet   0   . lisinopril (PRINIVIL,ZESTRIL) 10 MG tablet   Oral   Take 10 mg by mouth daily with supper.           . metFORMIN (GLUCOPHAGE) 500 MG tablet   Oral   Take 1-2 tablets (500-1,000 mg total) by mouth 2 (two) times daily with a meal. If cbg <150, takes 1 tablet.  If cbg>150, takes 2 tablet   120 tablet   11   . metoprolol (LOPRESSOR) 50 MG tablet      TAKE 1 TABLET BY MOUTH TWICE A DAY   180 tablet   1   . naproxen sodium (ANAPROX) 220 MG tablet   Oral   Take 220 mg by mouth daily as needed. For pain         . nitroGLYCERIN (NITROSTAT) 0.4 MG SL tablet   Sublingual   Place 1 tablet (0.4 mg total) under the tongue every 5 (five) minutes x 3 doses as needed. For chest pain.   25 tablet   2   . oxyCODONE-acetaminophen (PERCOCET) 10-325 MG per tablet   Oral   Take 1 tablet by mouth every 8 (eight) hours as needed. For pain         . pravastatin (PRAVACHOL) 40 MG tablet   Oral   Take 40 mg by mouth daily.         . ranitidine (ZANTAC) 150 MG tablet   Oral   Take 150 mg by mouth daily as needed. For acid reflux         . tiZANidine (ZANAFLEX) 4 MG tablet   Oral   Take 4-12 mg by mouth at bedtime. For sleep          BP 171/79  Pulse 93  Temp(Src) 98.5 F (36.9 C) (Oral)  Resp 11  Ht 5\' 9"  (1.753 m)  Wt 165 lb (74.844 kg)  BMI 24.36 kg/m2  SpO2 99% Physical Exam  Nursing note and vitals reviewed. Constitutional: He is oriented to person, place, and time. He appears well-developed and well-nourished. No distress.  Middle age male, laying in bed, appears well.   HENT:  Head: Normocephalic and atraumatic.  Mouth/Throat: Oropharynx is clear and moist.  Eyes: Conjunctivae and EOM are normal. Pupils are equal, round, and reactive to light.  Neck: Normal range of motion. Neck supple.  Cardiovascular: Normal rate, regular rhythm, normal heart sounds and intact distal pulses.   Pulmonary/Chest: Effort normal and breath sounds normal. No respiratory distress.  Abdominal: Soft. Bowel sounds are normal. There is no tenderness. There is no rebound and no  guarding.   Musculoskeletal: Normal range of motion. He exhibits no edema and no tenderness.  Neurological: He is alert and oriented to person, place, and time. No cranial nerve deficit. Coordination normal.  Skin: Skin is warm and dry. No rash noted.  Psychiatric: He has a normal mood and affect. His behavior is normal.    ED Course  Procedures (including critical care time) Labs Review Labs Reviewed  CBC - Abnormal; Notable for the following:    WBC 13.0 (*)    MCH 34.6 (*)    MCHC 36.1 (*)    All other components within normal limits  BASIC METABOLIC PANEL - Abnormal; Notable for the following:    Sodium 134 (*)    Chloride 95 (*)    Glucose, Bld 176 (*)    GFR calc non Af Amer 74 (*)    GFR calc Af Amer 85 (*)    All other components within normal limits  PRO B NATRIURETIC PEPTIDE  POCT I-STAT TROPONIN I   Imaging Review No results found.  EKG Interpretation    Date/Time:  Thursday July 07 2013 20:37:35 EST Ventricular Rate:  98 PR Interval:  124 QRS Duration: 138 QT Interval:  374 QTC Calculation: 477 R Axis:   130 Text Interpretation:  Normal sinus rhythm Right bundle branch block Left posterior fascicular block  Bifascicular block.  No significant change since last tracing Confirmed by Ashok Cordia  MD, Lennette Bihari ([id number]) on 07/07/2013 9:04:11 PM            MDM   HPI: Ms. Fuston is a 63 yo M with history of CAD s/p CABG, HTN, DM, GERD and COPD who presents with chest pain. Initial ECG shows SR, LBBB, normal axis, no significant change from previous but difficult to interpret given underlying conduction abnormalities. CXR without acute abnormality, doubt PTX, PNA or aortic dissection as cause of symptoms. No risk factors for PE, not hypoxic or tachycardic. I-stat troponin negative. Labs without significant abnormality. He took ASA just prior to arrival. Placed nitro paste. Given extensive cardiac history with symptoms similar to previous MI, will admit for cardiac r/o. Patient  updated on plan. He was in agreement with plan.   Reviewed imaging, ECG, previous medical records and labs, utilized in MDM  Discussed case with Dr. Ashok Cordia  Clinical Impression 1. Chest pain.      Louretta Shorten, MD 07/08/13 [phone]

## 2013-07-07 NOTE — ED Notes (Addendum)
Cp x 3 weeks. Some numbness left arm and shoulder. Been nauseated, sick several times. Much congestion. Hx. Panic attacks. Coughing up a lot of pleghm (lots) - Catlyn Shipton/green. Chills. Took 324 mg asa.

## 2013-07-08 ENCOUNTER — Encounter (HOSPITAL_COMMUNITY): Payer: Self-pay | Admitting: General Practice

## 2013-07-08 ENCOUNTER — Encounter (HOSPITAL_COMMUNITY)
Admission: EM | Disposition: A | Discharge status: Home / Self Care | Payer: Self-pay | Source: Home / Self Care | Attending: Emergency Medicine

## 2013-07-08 DIAGNOSIS — E119 Type 2 diabetes mellitus without complications: Secondary | ICD-10-CM

## 2013-07-08 DIAGNOSIS — I10 Essential (primary) hypertension: Secondary | ICD-10-CM

## 2013-07-08 DIAGNOSIS — F17200 Nicotine dependence, unspecified, uncomplicated: Secondary | ICD-10-CM

## 2013-07-08 DIAGNOSIS — I2510 Atherosclerotic heart disease of native coronary artery without angina pectoris: Secondary | ICD-10-CM

## 2013-07-08 DIAGNOSIS — E785 Hyperlipidemia, unspecified: Secondary | ICD-10-CM

## 2013-07-08 DIAGNOSIS — R079 Chest pain, unspecified: Secondary | ICD-10-CM | POA: Diagnosis present

## 2013-07-08 HISTORY — PX: LEFT HEART CATHETERIZATION WITH CORONARY ANGIOGRAM: SHX5451

## 2013-07-08 LAB — BASIC METABOLIC PANEL
BUN: 11 mg/dL (ref 6–23)
CALCIUM: 9.6 mg/dL (ref 8.4–10.5)
CO2: 21 mEq/L (ref 19–32)
CREATININE: 1.15 mg/dL (ref 0.50–1.35)
Chloride: 99 mEq/L (ref 96–112)
GFR, EST AFRICAN AMERICAN: 78 mL/min — AB (ref >90)
GFR, EST NON AFRICAN AMERICAN: 67 mL/min — AB (ref >90)
GLUCOSE: 129 mg/dL — AB (ref 70–99)
Potassium: 4.3 mEq/L (ref 3.7–5.3)
Sodium: 135 mEq/L — ABNORMAL LOW (ref 137–147)

## 2013-07-08 LAB — GLUCOSE, CAPILLARY
GLUCOSE-CAPILLARY: 137 mg/dL — AB (ref 70–99)
GLUCOSE-CAPILLARY: 136 mg/dL — AB (ref 70–99)
GLUCOSE-CAPILLARY: 124 mg/dL — AB (ref 70–99)
Glucose-Capillary: 145 mg/dL — ABNORMAL HIGH (ref 70–99)
Glucose-Capillary: 128 mg/dL — ABNORMAL HIGH (ref 70–99)
Glucose-Capillary: 164 mg/dL — ABNORMAL HIGH (ref 70–99)

## 2013-07-08 LAB — LIPID PANEL
CHOL/HDL RATIO: 6.9 ratio
Cholesterol: 234 mg/dL — ABNORMAL HIGH (ref 0–200)
HDL: 34 mg/dL — ABNORMAL LOW (ref >39)
LDL Cholesterol: 123 mg/dL — ABNORMAL HIGH (ref 0–99)
Triglycerides: 385 mg/dL — ABNORMAL HIGH (ref <150)
VLDL: 77 mg/dL — ABNORMAL HIGH (ref 0–40)

## 2013-07-08 LAB — URINALYSIS W MICROSCOPIC + REFLEX CULTURE
BILIRUBIN URINE: NEGATIVE
Glucose, UA: NEGATIVE mg/dL
HGB URINE DIPSTICK: NEGATIVE
Ketones, ur: NEGATIVE mg/dL
Leukocytes, UA: NEGATIVE
Nitrite: NEGATIVE
PH: 6 (ref 5.0–8.0)
Protein, ur: NEGATIVE mg/dL
Specific Gravity, Urine: 1.006 (ref 1.005–1.030)
UROBILINOGEN UA: 0.2 mg/dL (ref 0.0–1.0)

## 2013-07-08 LAB — HEMOGLOBIN A1C
HEMOGLOBIN A1C: 7.6 % — AB (ref <5.7)
MEAN PLASMA GLUCOSE: 171 mg/dL — AB (ref <117)

## 2013-07-08 LAB — TSH: TSH: 3.425 u[IU]/mL (ref 0.350–4.500)

## 2013-07-08 LAB — RAPID URINE DRUG SCREEN, HOSP PERFORMED
Amphetamines: NOT DETECTED
Barbiturates: NOT DETECTED
Benzodiazepines: NOT DETECTED
COCAINE: NOT DETECTED
OPIATES: NOT DETECTED
Tetrahydrocannabinol: NOT DETECTED

## 2013-07-08 LAB — CBC
HCT: 40.2 % (ref 39.0–52.0)
Hemoglobin: 14.5 g/dL (ref 13.0–17.0)
MCH: 33.6 pg (ref 26.0–34.0)
MCHC: 36.1 g/dL — AB (ref 30.0–36.0)
MCV: 93.3 fL (ref 78.0–100.0)
PLATELETS: 217 10*3/uL (ref 150–400)
RBC: 4.31 MIL/uL (ref 4.22–5.81)
RDW: 12.4 % (ref 11.5–15.5)
WBC: 12.5 10*3/uL — ABNORMAL HIGH (ref 4.0–10.5)

## 2013-07-08 LAB — TROPONIN I: Troponin I: <0.3 ng/mL (ref <0.30)

## 2013-07-08 SURGERY — LEFT HEART CATHETERIZATION WITH CORONARY ANGIOGRAM
Anesthesia: LOCAL

## 2013-07-08 MED ORDER — NITROGLYCERIN 0.4 MG SL SUBL: 0.4000 mg | SUBLINGUAL_TABLET | SUBLINGUAL | Status: DC | PRN

## 2013-07-08 MED ORDER — SODIUM CHLORIDE 0.9 % IJ SOLN
3.0000 mL | Freq: Two times a day (BID) | INTRAMUSCULAR | Status: DC
  Administered 2013-07-09 (×2): 3 mL via INTRAVENOUS

## 2013-07-08 MED ORDER — ISOSORBIDE MONONITRATE ER 30 MG PO TB24
30.0000 mg | ORAL_TABLET | Freq: Every day | ORAL | Status: DC
  Administered 2013-07-08 – 2013-07-09 (×2): 30 mg via ORAL
  Filled 2013-07-08 (×2): qty 1

## 2013-07-08 MED ORDER — ACETAMINOPHEN 325 MG PO TABS: 650.0000 mg | ORAL_TABLET | Freq: Four times a day (QID) | ORAL | Status: DC | PRN

## 2013-07-08 MED ORDER — LIDOCAINE HCL (PF) 1 % IJ SOLN
INTRAMUSCULAR | Status: AC
  Filled 2013-07-08: qty 30

## 2013-07-08 MED ORDER — ENOXAPARIN SODIUM 40 MG/0.4ML ~~LOC~~ SOLN
40.0000 mg | SUBCUTANEOUS | Status: DC
  Administered 2013-07-09: 40 mg via SUBCUTANEOUS
  Filled 2013-07-08 (×2): qty 0.4

## 2013-07-08 MED ORDER — INSULIN ASPART 100 UNIT/ML ~~LOC~~ SOLN
0.0000 [IU] | Freq: Three times a day (TID) | SUBCUTANEOUS | Status: DC
Start: 2013-07-08 — End: 2013-07-09
  Administered 2013-07-09: 09:00:00 via SUBCUTANEOUS

## 2013-07-08 MED ORDER — METOPROLOL TARTRATE 50 MG PO TABS
50.0000 mg | ORAL_TABLET | Freq: Two times a day (BID) | ORAL | Status: DC
  Administered 2013-07-08 – 2013-07-09 (×2): 50 mg via ORAL
  Filled 2013-07-08 (×5): qty 1

## 2013-07-08 MED ORDER — FAMOTIDINE 20 MG PO TABS
20.0000 mg | ORAL_TABLET | Freq: Every day | ORAL | Status: DC
  Administered 2013-07-08 – 2013-07-09 (×2): 20 mg via ORAL
  Filled 2013-07-08 (×2): qty 1

## 2013-07-08 MED ORDER — ATORVASTATIN CALCIUM 40 MG PO TABS
40.0000 mg | ORAL_TABLET | Freq: Every day | ORAL | Status: DC
  Administered 2013-07-08: 40 mg via ORAL
  Filled 2013-07-08 (×2): qty 1

## 2013-07-08 MED ORDER — MIDAZOLAM HCL 2 MG/2ML IJ SOLN
INTRAMUSCULAR | Status: AC
  Filled 2013-07-08: qty 2

## 2013-07-08 MED ORDER — SODIUM CHLORIDE 0.9 % IV SOLN
INTRAVENOUS | Status: DC
  Administered 2013-07-08: 13:00:00 via INTRAVENOUS

## 2013-07-08 MED ORDER — HEPARIN SODIUM (PORCINE) 1000 UNIT/ML IJ SOLN
INTRAMUSCULAR | Status: AC
  Filled 2013-07-08: qty 1

## 2013-07-08 MED ORDER — ACETAMINOPHEN 650 MG RE SUPP: 650.0000 mg | Freq: Four times a day (QID) | RECTAL | Status: DC | PRN

## 2013-07-08 MED ORDER — LISINOPRIL 10 MG PO TABS
10.0000 mg | ORAL_TABLET | Freq: Every day | ORAL | Status: DC
  Administered 2013-07-08 – 2013-07-09 (×2): 10 mg via ORAL
  Filled 2013-07-08 (×2): qty 1

## 2013-07-08 MED ORDER — OXYCODONE-ACETAMINOPHEN 7.5-325 MG PO TABS: 1.0000 | ORAL_TABLET | Freq: Three times a day (TID) | ORAL | Status: DC | PRN

## 2013-07-08 MED ORDER — ATORVASTATIN CALCIUM 40 MG PO TABS: 40.0000 mg | ORAL_TABLET | Freq: Every day | ORAL | Status: DC

## 2013-07-08 MED ORDER — HEPARIN (PORCINE) IN NACL 2-0.9 UNIT/ML-% IJ SOLN
INTRAMUSCULAR | Status: AC
  Filled 2013-07-08: qty 1000

## 2013-07-08 MED ORDER — OXYCODONE-ACETAMINOPHEN 5-325 MG PO TABS: 1.5000 | ORAL_TABLET | Freq: Three times a day (TID) | ORAL | Status: DC | PRN

## 2013-07-08 MED ORDER — SODIUM CHLORIDE 0.9 % IV SOLN: 250.0000 mL | INTRAVENOUS | Status: DC | PRN

## 2013-07-08 MED ORDER — NITROGLYCERIN 2 % TD OINT
0.5000 [in_us] | TOPICAL_OINTMENT | Freq: Once | TRANSDERMAL | Status: AC
  Administered 2013-07-08: 0.5 [in_us] via TOPICAL
  Filled 2013-07-08: qty 1

## 2013-07-08 MED ORDER — ALPRAZOLAM 0.5 MG PO TABS: 0.5000 mg | ORAL_TABLET | Freq: Two times a day (BID) | ORAL | Status: DC | PRN

## 2013-07-08 MED ORDER — VERAPAMIL HCL 2.5 MG/ML IV SOLN
INTRAVENOUS | Status: AC
  Filled 2013-07-08: qty 2

## 2013-07-08 MED ORDER — ASPIRIN 325 MG PO TABS
325.0000 mg | ORAL_TABLET | Freq: Every day | ORAL | Status: DC
  Administered 2013-07-09: 325 mg via ORAL
  Filled 2013-07-08 (×2): qty 1

## 2013-07-08 MED ORDER — SODIUM CHLORIDE 0.9 % IJ SOLN: 3.0000 mL | INTRAMUSCULAR | Status: DC | PRN

## 2013-07-08 MED ORDER — FENTANYL CITRATE 0.05 MG/ML IJ SOLN
INTRAMUSCULAR | Status: AC
  Filled 2013-07-08: qty 2

## 2013-07-08 MED ORDER — SODIUM CHLORIDE 0.9 % IJ SOLN
3.0000 mL | Freq: Two times a day (BID) | INTRAMUSCULAR | Status: DC
  Administered 2013-07-08: 3 mL via INTRAVENOUS

## 2013-07-08 MED ORDER — ONDANSETRON HCL 4 MG/2ML IJ SOLN: 4.0000 mg | Freq: Four times a day (QID) | INTRAMUSCULAR | Status: DC | PRN

## 2013-07-08 MED ORDER — SODIUM CHLORIDE 0.9 % IV SOLN: 1.0000 mL/kg/h | INTRAVENOUS | Status: AC

## 2013-07-08 MED ORDER — ASPIRIN 81 MG PO CHEW
81.0000 mg | CHEWABLE_TABLET | ORAL | Status: AC
  Administered 2013-07-08: 81 mg via ORAL
  Filled 2013-07-08: qty 1

## 2013-07-08 MED ORDER — SODIUM CHLORIDE 0.9 % IJ SOLN: 3.0000 mL | Freq: Two times a day (BID) | INTRAMUSCULAR | Status: DC

## 2013-07-08 MED ORDER — NITROGLYCERIN 0.2 MG/ML ON CALL CATH LAB
INTRAVENOUS | Status: AC
  Filled 2013-07-08: qty 1

## 2013-07-08 NOTE — Interval H&P Note (Signed)
History and Physical Interval Note:  07/08/2013 4:58 PM  Jason [name]  has presented today for surgery, with the diagnosis of Chest pain  The various methods of treatment have been discussed with the patient and family. After consideration of risks, benefits and other options for treatment, the patient has consented to  Procedure(s): LEFT HEART CATHETERIZATION WITH CORONARY ANGIOGRAM (N/A) as a surgical intervention .  The patient's history has been reviewed, patient examined, no change in status, stable for surgery.  I have reviewed the patient's chart and labs.  Questions were answered to the patient's satisfaction.    Cath Lab Visit (complete for each Cath Lab visit)  Clinical Evaluation Leading to the Procedure:   ACS: yes  Non-ACS:    Anginal Classification: CCS IV  Anti-ischemic medical therapy: Maximal Therapy (2 or more classes of medications)  Non-Invasive Test Results: No non-invasive testing performed  Prior CABG: No previous CABG       Jason [name]

## 2013-07-08 NOTE — H&P (Signed)
[facility] History and Physical  Jason [name] [id number] DOB: 1951/05/08 DOA: 07/07/2013   PCP: Jenny Reichmann, MD   Chief Complaint: Chest pain  HPI:  63 year old male with a history of coronary artery disease s/p CABG x 3 vessels, hypertension, diabetes mellitus, COPD with continued tobacco use, and panic attacks presents with chest discomfort that occurs both with rest and exertion. He states that with exertion he has associated shortness of breath and chest discomfort that is relieved at rest. However in the past 24 hours, he has noticed increasing chest discomfort even at rest associated with some nausea and radiation of his left chest discomfort to his left arm. In fact, he went to take some of his neighbors xanax which did not give him much relief. He had a heart catheterization 10/03/2011 which showed three-vessel native CAD, known occluded SVG-RCA with EF of 50%. In addition, he has complained of decreased exercise tolerance associated with increasing shortness of breath. He continues to smoke one pack per day. He has over 50-pack-year history. The patient complains of URI type symptoms over the past 3-4 weeks with a cough and clear sputum with subjective fevers and chills. He states that his cough is gradually getting better now.  In the emergency department, EKG showed right bundle branch block and obstetric panel was negative. The patient is hemodynamically stable. He was started on nitro paste with some improvement of his chest discomfort. WBC was 13.0. BMP was essentially unremarkable. ProBNP was 77. Assessment/Plan:  Chest pain -Patient has typical and atypical features -It is somewhat reproducible with palpation -Continue aspirin and nitro paste for now -Will need cardiology evaluation -Pt. Sees Dr. Sallyanne Kuster, but has not followed up in > 1 yr -Continue metoprolol tartrate -continue imdur Hypertension -Continue lisinopril, metoprolol tartrate Diabetes mellitus  type 2 -Discontinue metformin for now -NovoLog sliding scale -Hemoglobin A1c -Check lipid panel Anxiety disorder/panic attacks -Continue Xanax when necessary -UDS Leukocytosis -CXR neg infiltrate -afebrile and hemodynamically stable -UA with reflex to urine culture Tobacco abuse -Tobacco cessation discussed  lumbar spondylolisthesis -Status post lumbar fusion -Continue Percocet     Past Medical History  Diagnosis Date  . Coronary artery disease     CABG 2/01, cath 4/13 - med Rx  . Hypertension     type 2 NIDDM  . Diabetes mellitus   . Arthritis   . GERD (gastroesophageal reflux disease)   . High cholesterol   . Myocardial infarct     x4 last 10 yrs  . Anxiety     off xanax  and paxil since 3/13  . COPD (chronic obstructive pulmonary disease)   . Pneumonia     hx  . Cancer     tumor basal cell rem from lft arm  . Smoker   . Anxiety   . 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 Depression   . Substance abuse   . Cataract   . Heart attack    Past Surgical History  Procedure Laterality Date  . Coronary stent placement    . Femoral artery - femoral artery bypass graft Right 2004    femoral enarterectomy  . US extremity*l*      lft arm tumor removed   . Cardiac catheterization  4/12 and 4/13    Medical Rx  . Coronary angioplasty  Jan 2004    RCA  . Back surgery  Jan 2014    Dr Vertell Limber  . Eye surgery  cat bil  . Coronary artery bypass graft  07/24/2009    L-LAD, SVG-RI/OM, SVG-PDA   Social History:  reports that he has been smoking Cigarettes.  He has a 40 pack-year smoking history. He does not have any smokeless tobacco history on file. He reports that he drinks alcohol. He reports that he does not use illicit drugs.   Family History  Problem Relation Age of Onset  . Arthritis Mother   . Heart disease Father      Allergies  Allergen Reactions  . Sulfa Antibiotics Nausea And Vomiting and Other (See Comments)     Also headaches       Prior to Admission medications   Medication Sig Start Date End Date Taking? Authorizing Provider  ALPRAZolam Duanne Moron) 0.5 MG tablet Take 0.5 mg by mouth 2 (two) times daily as needed for anxiety (or panic attacks).   Yes Historical Provider, MD  aspirin 325 MG tablet Take 325 mg by mouth daily.   Yes Historical Provider, MD  isosorbide mononitrate (IMDUR) 30 MG 24 hr tablet Take 30 mg by mouth daily.   Yes Historical Provider, MD  lisinopril (PRINIVIL,ZESTRIL) 10 MG tablet Take 10 mg by mouth daily.    Yes Historical Provider, MD  metFORMIN (GLUCOPHAGE) 500 MG tablet Take 1-2 tablets (500-1,000 mg total) by mouth 2 (two) times daily with a meal. If cbg <150, takes 1 tablet.  If cbg>150, takes 2 tablet 10/01/12  Yes [name], MD  metoprolol (LOPRESSOR) 50 MG tablet Take 50 mg by mouth 2 (two) times daily.   Yes Historical Provider, MD  naproxen sodium (ANAPROX) 220 MG tablet Take 440 mg by mouth daily as needed (pain). For pain   Yes Historical Provider, MD  nitroGLYCERIN (NITROSTAT) 0.4 MG SL tablet Place 1 tablet (0.4 mg total) under the tongue every 5 (five) minutes x 3 doses as needed. For chest pain. 10/04/11  Yes Luke K Kilroy, PA-C  oxyCODONE-acetaminophen (PERCOCET) 7.5-325 MG per tablet Take 1 tablet by mouth every 8 (eight) hours as needed for pain.   Yes Historical Provider, MD  ranitidine (ZANTAC) 150 MG tablet Take 150 mg by mouth daily as needed. For acid reflux   Yes Historical Provider, MD    Review of Systems:  Constitutional:  No weight loss, night sweats, Fevers, chills Head&Eyes: No headache.  No vision loss.  No eye pain or scotoma ENT:  No Difficulty swallowing,Tooth/dental problems,Sore throat,  No ear ache, post nasal drip,  Cardio-vascular:  No  Orthopnea, PND, swelling in lower extremities,  dizziness, palpitations  GI:  No  abdominal pain, vomiting, diarrhea, loss of appetite, hematochezia, melena, heartburn, indigestion, Resp:   No  coughing up of blood .No wheezing.No chest wall deformity. complains of some dyspnea with exertion. Had nonproductive cough. Skin:  no rash or lesions.  GU:  no dysuria, change in color of urine, no urgency or frequency. No flank pain.  Musculoskeletal:  No joint pain or swelling. No decreased range of motion. No back pain.  Psych:  No change in mood or affect. No depression or anxiety. Neurologic: No headache, no dysesthesia, no focal weakness, no vision loss. No syncope  Physical Exam: Filed Vitals:   07/07/13 2200 07/07/13 2230 07/07/13 2330 07/08/13 0018  BP: 138/69 124/71 107/61 119/52  Pulse: 86 83 85 84  Temp:      TempSrc:      Resp: 23 22 24 20   Height:      Weight:  SpO2: 98% 94% 96% 96%   General:  A&O x 3, NAD, nontoxic, pleasant/cooperative Head/Eye: No conjunctival hemorrhage, no icterus, [place]/AT, No nystagmus ENT:  No icterus,  No thrush,  no pharyngeal exudate Neck:  No masses, no lymphadenpathy, no bruits CV:  RRR, no rub, no gallop, no S3 Lung:  Bibasilar crackles. No wheezing. Good air movement Abdomen: soft/NT, +BS, nondistended, no peritoneal signs Ext: No cyanosis, No rashes, No petechiae, No lymphangitis, No edema   Labs on Admission:  Basic Metabolic Panel:  Recent Labs Lab 07/07/13 2033  NA 134*  K 3.9  CL 95*  CO2 20  GLUCOSE 176*  BUN 11  CREATININE 1.07  CALCIUM 10.0   Liver Function Tests: No results found for this basename: AST, ALT, ALKPHOS, BILITOT, PROT, ALBUMIN,  in the last 168 hours No results found for this basename: LIPASE, AMYLASE,  in the last 168 hours No results found for this basename: AMMONIA,  in the last 168 hours CBC:  Recent Labs Lab 07/07/13 2033  WBC 13.0*  HGB 16.0  HCT 44.3  MCV 95.7  PLT 233   Cardiac Enzymes: No results found for this basename: CKTOTAL, CKMB, CKMBINDEX, TROPONINI,  in the last 168 hours BNP: No components found with this basename: POCBNP,  CBG: No results found for this  basename: GLUCAP,  in the last 168 hours  Radiological Exams on Admission: Dg Chest 2 View  07/07/2013   CLINICAL DATA:  Chest pressure for 1 month.  EXAM: CHEST  2 VIEW  COMPARISON:  PA and lateral chest 07/09/2012.  FINDINGS: The patient is status post CABG. Lungs are clear. No pneumothorax pleural effusion. Heart size is normal. Thoracic spondylosis is noted.  IMPRESSION: No acute disease.   Electronically Signed   By: Inge Rise M.D.   On: 07/07/2013 21:27    EKG: Independently reviewed. Sinus, RBBB, LPFB    Time spent:60 minutes Code Status:   FULL Family Communication:  No  Family at bedside   Meyer Arora, DO  [facility] Pager [phone]  If 7PM-7AM, please contact night-coverage [email or web address] Password [facility] 07/08/2013, 12:42 AM

## 2013-07-08 NOTE — Plan of Care (Signed)
Patient is established pt of Dr Nena Jordan, [place]. D/w Family practice service, they will assume care. [facility] will sign off.    RAI,RIPUDEEP M.D. [facility] 07/08/2013, 7:34 AM  Pager: [phone]

## 2013-07-08 NOTE — Consult Note (Signed)
CONSULT NOTE  Date: 07/08/2013               Patient Name:  Jason [name] MRN: [id number]  DOB: 08/11/1950 Age / Sex: 63 y.o., male        PCP: Nena Jordan A Primary Cardiologist: [place]            Referring Physician: Med teaching service              Reason for Consult: Hx of CAD, chest           History of Present Illness: Patient is a 63 y.o. male with a PMHx of CAD, CABG, ongoing cigarette smoking, who was admitted to [facility] on 07/07/2013 for evaluation of worsening CP / pressure.  Pt has an extensive hx of CAD . These symptoms are very similar to his previous symptoms of angina.  He does not have SL NTG at home.    He is pain free currently.  Troponin levels are negative.  Sx: still smokes.     Medications: Outpatient medications: Prescriptions prior to admission  Medication Sig Dispense Refill  . ALPRAZolam (XANAX) 0.5 MG tablet Take 0.5 mg by mouth 2 (two) times daily as needed for anxiety (or panic attacks).      Marland Kitchen aspirin 325 MG tablet Take 325 mg by mouth daily.      . isosorbide mononitrate (IMDUR) 30 MG 24 hr tablet Take 30 mg by mouth daily.      Marland Kitchen lisinopril (PRINIVIL,ZESTRIL) 10 MG tablet Take 10 mg by mouth daily.       . metFORMIN (GLUCOPHAGE) 500 MG tablet Take 1-2 tablets (500-1,000 mg total) by mouth 2 (two) times daily with a meal. If cbg <150, takes 1 tablet.  If cbg>150, takes 2 tablet  120 tablet  11  . metoprolol (LOPRESSOR) 50 MG tablet Take 50 mg by mouth 2 (two) times daily.      . naproxen sodium (ANAPROX) 220 MG tablet Take 440 mg by mouth daily as needed (pain). For pain      . nitroGLYCERIN (NITROSTAT) 0.4 MG SL tablet Place 1 tablet (0.4 mg total) under the tongue every 5 (five) minutes x 3 doses as needed. For chest pain.  25 tablet  2  . oxyCODONE-acetaminophen (PERCOCET) 7.5-325 MG per tablet Take 1 tablet by mouth every 8 (eight) hours as needed for pain.      . ranitidine (ZANTAC) 150 MG tablet Take 150 mg by mouth daily as  needed. For acid reflux        Current medications: Current Facility-Administered Medications  Medication Dose Route Frequency Provider Last Rate Last Dose  . acetaminophen (TYLENOL) tablet 650 mg  650 mg Oral Q6H PRN Orson Eva, MD       Or  . acetaminophen (TYLENOL) suppository 650 mg  650 mg Rectal Q6H PRN Orson Eva, MD      . ALPRAZolam Duanne Moron) tablet 0.5 mg  0.5 mg Oral BID PRN Orson Eva, MD      . aspirin tablet 325 mg  325 mg Oral Daily Shanon Brow Tat, MD      . atorvastatin (LIPITOR) tablet 40 mg  40 mg Oral q1800 Gerda Diss, DO      . enoxaparin (LOVENOX) injection 40 mg  40 mg Subcutaneous Q24H Orson Eva, MD      . famotidine (PEPCID) tablet 20 mg  20 mg Oral Daily Orson Eva, MD      . insulin  aspart (novoLOG) injection 0-9 Units  0-9 Units Subcutaneous TID WC Orson Eva, MD      . isosorbide mononitrate (IMDUR) 24 hr tablet 30 mg  30 mg Oral Daily Shanon Brow Tat, MD      . lisinopril (PRINIVIL,ZESTRIL) tablet 10 mg  10 mg Oral Daily Orson Eva, MD      . metoprolol (LOPRESSOR) tablet 50 mg  50 mg Oral BID Orson Eva, MD      . nitroGLYCERIN (NITROSTAT) SL tablet 0.4 mg  0.4 mg Sublingual Q5 Min x 3 PRN Orson Eva, MD      . oxyCODONE-acetaminophen (PERCOCET/ROXICET) 5-325 MG per tablet 1.5 tablet  1.5 tablet Oral Q8H PRN Shanon Brow Tat, MD      . sodium chloride 0.9 % injection 3 mL  3 mL Intravenous Q12H Orson Eva, MD         Allergies  Allergen Reactions  . Sulfa Antibiotics Nausea And Vomiting and Other (See Comments)    Also headaches      Past Medical History  Diagnosis Date  . Coronary artery disease     CABG 2/01, cath 4/13 - med Rx  . Hypertension     type 2 NIDDM  . Diabetes mellitus   . Arthritis   . GERD (gastroesophageal reflux disease)   . High cholesterol   . Myocardial infarct     x4 last 10 yrs  . Anxiety     off xanax  and paxil since 3/13  . COPD (chronic obstructive pulmonary disease)   . Pneumonia     hx  . Cancer     tumor basal cell rem from lft arm    . Smoker   . Anxiety   . 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 Depression   . Substance abuse   . Cataract   . Heart attack     Past Surgical History  Procedure Laterality Date  . Coronary stent placement    . Femoral artery - femoral artery bypass graft Right 2004    femoral enarterectomy  . US extremity*l*      lft arm tumor removed   . Cardiac catheterization  4/12 and 4/13    Medical Rx  . Coronary angioplasty  Jan 2004    RCA  . Back surgery  Jan 2014    Dr Vertell Limber  . Coronary artery bypass graft  07/24/2009    L-LAD, SVG-RI/OM, SVG-PDA  . Eye surgery      cat bil    Family History  Problem Relation Age of Onset  . Arthritis Mother   . Heart disease Father     Social History:  reports that he has been smoking Cigarettes.  He has a 40 pack-year smoking history. He has never used smokeless tobacco. He reports that he drinks alcohol. He reports that he does not use illicit drugs.   Review of Systems: Constitutional:  denies fever, chills, diaphoresis, appetite change and fatigue.  HEENT: denies photophobia, eye pain, redness, hearing loss, ear pain, congestion, sore throat, rhinorrhea, sneezing, neck pain, neck stiffness and tinnitus.  Respiratory: admits to SOB, DOE, cough, chest tightness, and wheezing.  Cardiovascular: admits to chest pain, palpitations and leg swelling.  Gastrointestinal: denies nausea, vomiting, abdominal pain, diarrhea, constipation, blood in stool.  Genitourinary: denies dysuria, urgency, frequency, hematuria, flank pain and difficulty urinating.  Musculoskeletal: denies  myalgias, back pain, joint swelling, arthralgias and gait problem.   Skin: denies pallor, rash and wound.  Neurological: denies dizziness, seizures, syncope, weakness, light-headedness, numbness and headaches.   Hematological: denies adenopathy, easy bruising, personal or family bleeding history.  Psychiatric/ Behavioral: denies suicidal  ideation, mood changes, confusion, nervousness, sleep disturbance and agitation.    Physical Exam: BP 104/50  Pulse 83  Temp(Src) 97.7 F (36.5 C) (Oral)  Resp 18  Ht 5\' 9"  (1.753 m)  Wt 164 lb 12.8 oz (74.753 kg)  BMI 24.33 kg/m2  SpO2 97%  General: Vital signs reviewed and noted. Well-developed, well-nourished, in no acute distress; alert, appropriate and cooperative throughout examination.  Head: Normocephalic, atraumatic, sclera anicteric, mucus membranes are moist  Neck: Supple. Negative for carotid bruits. JVD not elevated.  Lungs:  Clear bilaterally to auscultation without wheezes, rales, or rhonchi. Breathing is unlabored.  Heart: RRR with S1 S2. No murmurs, rubs, or gallops appreciated.  Abdomen:  Soft, non-tender, non-distended with normoactive bowel sounds. No hepatomegaly. No rebound/guarding. No obvious abdominal masses  MSK: Strength and the appear normal for age.  Extremities: No clubbing or cyanosis. No edema.  Distal pedal pulses are 2+ and equal bilaterally.  Neurologic: Alert and oriented X 3. Moves all extremities spontaneously.  Psych: Responds to questions appropriately with a normal affect.    Lab results: Basic Metabolic Panel:  Recent Labs Lab 07/07/13 2033 07/08/13 0717  NA 134* 135*  K 3.9 4.3  CL 95* 99  CO2 20 21  GLUCOSE 176* 129*  BUN 11 11  CREATININE 1.07 1.15  CALCIUM 10.0 9.6    Liver Function Tests: No results found for this basename: AST, ALT, ALKPHOS, BILITOT, PROT, ALBUMIN,  in the last 168 hours No results found for this basename: LIPASE, AMYLASE,  in the last 168 hours No results found for this basename: AMMONIA,  in the last 168 hours  CBC:  Recent Labs Lab 07/07/13 2033 07/08/13 0717  WBC 13.0* 12.5*  HGB 16.0 14.5  HCT 44.3 40.2  MCV 95.7 93.3  PLT 233 217    Cardiac Enzymes:  Recent Labs Lab 07/08/13 0717  TROPONINI <0.30    BNP: No components found with this basename: POCBNP,   CBG:  Recent  Labs Lab 07/08/13 0158 07/08/13 0723  GLUCAP 145* 128*    Coagulation Studies: No results found for this basename: LABPROT, INR,  in the last 72 hours   Other results: EKG:   NSR, RBBB , no acute changes   Imaging: Dg Chest 2 View  07/07/2013   CLINICAL DATA:  Chest pressure for 1 month.  EXAM: CHEST  2 VIEW  COMPARISON:  PA and lateral chest 07/09/2012.  FINDINGS: The patient is status post CABG. Lungs are clear. No pneumothorax pleural effusion. Heart size is normal. Thoracic spondylosis is noted.  IMPRESSION: No acute disease.   Electronically Signed   By: Inge Rise M.D.   On: 07/07/2013 21:27         Assessment & Plan: 1. CAD:  Mr. Oran Rein has a long hx of CAD.  He now presents with worsening angina symptoms - chest pressure, left arm pain / numbness and worsening dyspnea.  These symptoms are similar to his previous symptoms.  Will schedule him for cardiac cath.  I have discussed risks, benefits, options.  He understands are agrees to proceed.  His other medical problems will be addressed by [facility].       Thayer Headings, Brooke Bonito., MD, [facility] 07/08/2013, 10:40 AM

## 2013-07-08 NOTE — H&P (Signed)
[facility] Admission History and Physical Service Pager: [phone]  Patient name: Jason [name] Medical record number: [id number] Date of birth: [birth year] Age: 63 y.o. Gender: male  Primary Care Provider: [name], MD  Chief Complaint: Chest pain  Assessment/Plan:  LOS: 1 day  63 y.o. year old male in OBS for CP eval in setting of Typical CP sx X 1.5 days and decreased functional status X 1.5 months and known Hx of CAD s/p CABG, continued tobacco abuse, who has been lost to follow up and not on optimal medical treatment   Chest pain -Patient has typical and atypical features.  Has been occuring on and off for past 1.5 months now persistent pressure and left shoulder pain for 1.5 days, improved with ASA, Nitro, Rest.   - currently denying pain, troponin's negative.   - Repeat EKG ordered - s/p 4 Vessel CABG, lost to follow up not on statin, continues to smoke, decreased functional status over past 1.5 months - Continue aspirin and nitro paste for now - Cardiology to see now; pt Sees Dr. Sallyanne Kuster, but has not followed up in > 1 yr - Continue metoprolol tartrate - Low threshold to transition to ACS protocol but will defer to cardiology - Start high Dose Statin.  Need regardless of Lipid profile  Hypertension - Continue lisinopril, metoprolol tartrate - If goes to cath monitor for Cr with lisinopril  Diabetes mellitus type 2 -Discontinue metformin for now -NovoLog sliding scale -Hemoglobin A1c -Check lipid panel  Anxiety disorder/panic attacks -Continue Xanax when necessary -UDS negative for cocaine (hx +)  Leukocytosis -CXR neg infiltrate, UA unremarkable -afebrile and hemodynamically stable  Tobacco abuse -Tobacco cessation discussed - defer patch but available upon request  Lumbar spondylolisthesis -Status post lumbar fusion -Continue Percocet  FEN/GI: NPO until eval by Cardiology Prophylaxis: Lovenox, pepcid Disposition: In  CP Obs, Cards to see  Code Status: Full  History of Present Illness: Jason [name] is a 63 y.o. year old male presenting with 1.5 month hx of decreased functional status and intermittent central chest pressure and left arm pain that is similar to his aginal equivalent but is also similar to his previously dx panic attacks.  Reports 1.5 days ago was at work and became extremely fatigued, nauseous, and reported chest pressure and severe left arm pain; he went home and took ASA 325 and Nitro which he normally does during these episodes and his pain improved but did not resolve.  After napping his pressure has remained and due to the duration (longer than previous episodes) presented for evalution.  Also notes left neck pain and occasional diaphoresis associated with this discomfort.    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  Known anxiety tx with Xanax and reports worsening over the past months.  Hx of Cocaine abuse but in remission for years; cigarette user.  Worsening productive cough over the past 1.5 months but afebrile.  Lost to Cardiac follow up due to insurance issues.   Review Of Systems: Per HPI with the following additions: No orthopnea, PND, LE edema. No orthostasis.   Patient Active Problem List   Diagnosis Date Noted  . Chest pain 07/08/2013  . Substance abuse in remission 10/01/2012  . Radiculitis 02/10/2012  . CAD, CABG Feb 2011, cath 10/03/11-medical Rx 10/03/2011  . PVD, hx Rt femoral endarterectomy 2004 10/03/2011  . Chest pain at rest 10/02/2011  .  Tobacco abuse disorder 10/02/2011  . DM (diabetes mellitus) 10/02/2011  . HTN (hypertension) 10/02/2011  . Hyperlipidemia 10/02/2011   Past Medical History: Past Medical History  Diagnosis Date  . Coronary artery disease     CABG 2/01, cath 4/13 - med Rx  . Hypertension     type 2 NIDDM  . Diabetes mellitus   . Arthritis   .  GERD (gastroesophageal reflux disease)   . High cholesterol   . Myocardial infarct     x4 last 10 yrs  . Anxiety     off xanax  and paxil since 3/13  . COPD (chronic obstructive pulmonary disease)   . Pneumonia     hx  . Cancer     tumor basal cell rem from lft arm  . Smoker   . Anxiety   . 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 Depression   . Substance abuse   . Cataract   . Heart attack    Past Surgical History: Past Surgical History  Procedure Laterality Date  . Coronary stent placement    . Femoral artery - femoral artery bypass graft Right 2004    femoral enarterectomy  . US extremity*l*      lft arm tumor removed   . Cardiac catheterization  4/12 and 4/13    Medical Rx  . Coronary angioplasty  Jan 2004    RCA  . Back surgery  Jan 2014    Dr Vertell Limber  . Eye surgery      cat bil  . Coronary artery bypass graft  07/24/2009    L-LAD, SVG-RI/OM, SVG-PDA   Social History: History  Substance Use Topics  . Smoking status: Current Every Day Smoker -- 1.00 packs/day for 40 years    Types: Cigarettes  . Smokeless tobacco: Not on file  . Alcohol Use: Yes     Comment: socially   For any additional social history documentation, please refer to relevant sections of EMR.  Family History: Family History  Problem Relation Age of Onset  . Arthritis Mother   . Heart disease Father    Allergies: Allergies  Allergen Reactions  . Sulfa Antibiotics Nausea And Vomiting and Other (See Comments)    Also headaches    No current facility-administered medications on file prior to encounter.   Current Outpatient Prescriptions on File Prior to Encounter  Medication Sig Dispense Refill  . aspirin 325 MG tablet Take 325 mg by mouth daily.      Marland Kitchen lisinopril (PRINIVIL,ZESTRIL) 10 MG tablet Take 10 mg by mouth daily.       . metFORMIN (GLUCOPHAGE) 500 MG tablet Take 1-2 tablets (500-1,000 mg total) by mouth 2 (two) times daily with a meal. If cbg  <150, takes 1 tablet.  If cbg>150, takes 2 tablet  120 tablet  11  . naproxen sodium (ANAPROX) 220 MG tablet Take 440 mg by mouth daily as needed (pain). For pain      . nitroGLYCERIN (NITROSTAT) 0.4 MG SL tablet Place 1 tablet (0.4 mg total) under the tongue every 5 (five) minutes x 3 doses as needed. For chest pain.  25 tablet  2  . ranitidine (ZANTAC) 150 MG tablet Take 150 mg by mouth daily as needed. For acid reflux      Physical Exam: BP 104/50  Pulse 83  Temp(Src) 97.7 F (36.5 C) (Oral)  Resp 18  Ht 5\' 9"  (1.753 m)  Wt 164 lb 12.8 oz (74.753  kg)  BMI 24.33 kg/m2  SpO2 97% PHYSICAL EXAM: GENERAL: Adult Caucasian  male. In no discomfort; no respiratory distress  PSYCH: alert and appropriate, good insight   HNEENT:  no JVD   CARDIO: RRR, S1/S2 heard, no murmur  LUNGS: CTA B, no wheezes, no crackles  ABDOMEN: +BS, soft, non-tender, no rigidity, no guarding, no masses/hepatosplenomegaly  EXTREM:  Warm, well perfused.  Moves all 4 extremities spontaneously; no lateralization.  No noted foot lesions.  Distal pulses 1+/4.  trace pretibial edema.  GU:   SKIN:     Labs and Imaging: CBC BMET   Recent Labs Lab 07/08/13 0717  WBC 12.5*  HGB 14.5  HCT 40.2  PLT 217    Recent Labs Lab 07/08/13 0717  NA 135*  K 4.3  CL 99  CO2 21  BUN 11  CREATININE 1.15  GLUCOSE 129*  CALCIUM 9.6      Recent Labs Lab 07/07/13 2033 07/07/13 2107 07/08/13 0717 07/08/13 1345  TROPIPOC  --  0.00  --   --   TROPONINI  --   --  <0.30 <0.30  PROBNP 77.9  --   --   --      Gerda Diss, DO [facility] Resident - PGY-3 07/08/2013 9:27 AM

## 2013-07-08 NOTE — H&P (View-Only) (Signed)
CONSULT NOTE  Date: 07/08/2013               Patient Name:  Jason [name] MRN: [id number]  DOB: 08/11/1950 Age / Sex: 63 y.o., male        PCP: Nena Jordan A Primary Cardiologist: [facility]            Referring Physician: Med teaching service              Reason for Consult: Hx of CAD, chest           History of Present Illness: Patient is a 63 y.o. male with a PMHx of CAD, CABG, ongoing cigarette smoking, who was admitted to [facility] on 07/07/2013 for evaluation of worsening CP / pressure.  Pt has an extensive hx of CAD . These symptoms are very similar to his previous symptoms of angina.  He does not have SL NTG at home.    He is pain free currently.  Troponin levels are negative.  Sx: still smokes.     Medications: Outpatient medications: Prescriptions prior to admission  Medication Sig Dispense Refill  . ALPRAZolam (XANAX) 0.5 MG tablet Take 0.5 mg by mouth 2 (two) times daily as needed for anxiety (or panic attacks).      Marland Kitchen aspirin 325 MG tablet Take 325 mg by mouth daily.      . isosorbide mononitrate (IMDUR) 30 MG 24 hr tablet Take 30 mg by mouth daily.      Marland Kitchen lisinopril (PRINIVIL,ZESTRIL) 10 MG tablet Take 10 mg by mouth daily.       . metFORMIN (GLUCOPHAGE) 500 MG tablet Take 1-2 tablets (500-1,000 mg total) by mouth 2 (two) times daily with a meal. If cbg <150, takes 1 tablet.  If cbg>150, takes 2 tablet  120 tablet  11  . metoprolol (LOPRESSOR) 50 MG tablet Take 50 mg by mouth 2 (two) times daily.      . naproxen sodium (ANAPROX) 220 MG tablet Take 440 mg by mouth daily as needed (pain). For pain      . nitroGLYCERIN (NITROSTAT) 0.4 MG SL tablet Place 1 tablet (0.4 mg total) under the tongue every 5 (five) minutes x 3 doses as needed. For chest pain.  25 tablet  2  . oxyCODONE-acetaminophen (PERCOCET) 7.5-325 MG per tablet Take 1 tablet by mouth every 8 (eight) hours as needed for pain.      . ranitidine (ZANTAC) 150 MG tablet Take 150 mg by mouth daily as  needed. For acid reflux        Current medications: Current Facility-Administered Medications  Medication Dose Route Frequency Provider Last Rate Last Dose  . acetaminophen (TYLENOL) tablet 650 mg  650 mg Oral Q6H PRN Orson Eva, MD       Or  . acetaminophen (TYLENOL) suppository 650 mg  650 mg Rectal Q6H PRN Orson Eva, MD      . ALPRAZolam Duanne Moron) tablet 0.5 mg  0.5 mg Oral BID PRN Orson Eva, MD      . aspirin tablet 325 mg  325 mg Oral Daily Shanon Brow Tat, MD      . atorvastatin (LIPITOR) tablet 40 mg  40 mg Oral q1800 Gerda Diss, DO      . enoxaparin (LOVENOX) injection 40 mg  40 mg Subcutaneous Q24H Orson Eva, MD      . famotidine (PEPCID) tablet 20 mg  20 mg Oral Daily Orson Eva, MD      . insulin  aspart (novoLOG) injection 0-9 Units  0-9 Units Subcutaneous TID WC Orson Eva, MD      . isosorbide mononitrate (IMDUR) 24 hr tablet 30 mg  30 mg Oral Daily Shanon Brow Tat, MD      . lisinopril (PRINIVIL,ZESTRIL) tablet 10 mg  10 mg Oral Daily Orson Eva, MD      . metoprolol (LOPRESSOR) tablet 50 mg  50 mg Oral BID Orson Eva, MD      . nitroGLYCERIN (NITROSTAT) SL tablet 0.4 mg  0.4 mg Sublingual Q5 Min x 3 PRN Orson Eva, MD      . oxyCODONE-acetaminophen (PERCOCET/ROXICET) 5-325 MG per tablet 1.5 tablet  1.5 tablet Oral Q8H PRN Shanon Brow Tat, MD      . sodium chloride 0.9 % injection 3 mL  3 mL Intravenous Q12H Orson Eva, MD         Allergies  Allergen Reactions  . Sulfa Antibiotics Nausea And Vomiting and Other (See Comments)    Also headaches      Past Medical History  Diagnosis Date  . Coronary artery disease     CABG 2/01, cath 4/13 - med Rx  . Hypertension     type 2 NIDDM  . Diabetes mellitus   . Arthritis   . GERD (gastroesophageal reflux disease)   . High cholesterol   . Myocardial infarct     x4 last 10 yrs  . Anxiety     off xanax  and paxil since 3/13  . COPD (chronic obstructive pulmonary disease)   . Pneumonia     hx  . Cancer     tumor basal cell rem from lft arm    . Smoker   . Anxiety   . 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 Depression   . Substance abuse   . Cataract   . Heart attack     Past Surgical History  Procedure Laterality Date  . Coronary stent placement    . Femoral artery - femoral artery bypass graft Right 2004    femoral enarterectomy  . US extremity*l*      lft arm tumor removed   . Cardiac catheterization  4/12 and 4/13    Medical Rx  . Coronary angioplasty  Jan 2004    RCA  . Back surgery  Jan 2014    Dr Vertell Limber  . Coronary artery bypass graft  07/24/2009    L-LAD, SVG-RI/OM, SVG-PDA  . Eye surgery      cat bil    Family History  Problem Relation Age of Onset  . Arthritis Mother   . Heart disease Father     Social History:  reports that he has been smoking Cigarettes.  He has a 40 pack-year smoking history. He has never used smokeless tobacco. He reports that he drinks alcohol. He reports that he does not use illicit drugs.   Review of Systems: Constitutional:  denies fever, chills, diaphoresis, appetite change and fatigue.  HEENT: denies photophobia, eye pain, redness, hearing loss, ear pain, congestion, sore throat, rhinorrhea, sneezing, neck pain, neck stiffness and tinnitus.  Respiratory: admits to SOB, DOE, cough, chest tightness, and wheezing.  Cardiovascular: admits to chest pain, palpitations and leg swelling.  Gastrointestinal: denies nausea, vomiting, abdominal pain, diarrhea, constipation, blood in stool.  Genitourinary: denies dysuria, urgency, frequency, hematuria, flank pain and difficulty urinating.  Musculoskeletal: denies  myalgias, back pain, joint swelling, arthralgias and gait problem.   Skin: denies pallor, rash and wound.  Neurological: denies dizziness, seizures, syncope, weakness, light-headedness, numbness and headaches.   Hematological: denies adenopathy, easy bruising, personal or family bleeding history.  Psychiatric/ Behavioral: denies suicidal  ideation, mood changes, confusion, nervousness, sleep disturbance and agitation.    Physical Exam: BP 104/50  Pulse 83  Temp(Src) 97.7 F (36.5 C) (Oral)  Resp 18  Ht 5\' 9"  (1.753 m)  Wt 164 lb 12.8 oz (74.753 kg)  BMI 24.33 kg/m2  SpO2 97%  General: Vital signs reviewed and noted. Well-developed, well-nourished, in no acute distress; alert, appropriate and cooperative throughout examination.  Head: Normocephalic, atraumatic, sclera anicteric, mucus membranes are moist  Neck: Supple. Negative for carotid bruits. JVD not elevated.  Lungs:  Clear bilaterally to auscultation without wheezes, rales, or rhonchi. Breathing is unlabored.  Heart: RRR with S1 S2. No murmurs, rubs, or gallops appreciated.  Abdomen:  Soft, non-tender, non-distended with normoactive bowel sounds. No hepatomegaly. No rebound/guarding. No obvious abdominal masses  MSK: Strength and the appear normal for age.  Extremities: No clubbing or cyanosis. No edema.  Distal pedal pulses are 2+ and equal bilaterally.  Neurologic: Alert and oriented X 3. Moves all extremities spontaneously.  Psych: Responds to questions appropriately with a normal affect.    Lab results: Basic Metabolic Panel:  Recent Labs Lab 07/07/13 2033 07/08/13 0717  NA 134* 135*  K 3.9 4.3  CL 95* 99  CO2 20 21  GLUCOSE 176* 129*  BUN 11 11  CREATININE 1.07 1.15  CALCIUM 10.0 9.6    Liver Function Tests: No results found for this basename: AST, ALT, ALKPHOS, BILITOT, PROT, ALBUMIN,  in the last 168 hours No results found for this basename: LIPASE, AMYLASE,  in the last 168 hours No results found for this basename: AMMONIA,  in the last 168 hours  CBC:  Recent Labs Lab 07/07/13 2033 07/08/13 0717  WBC 13.0* 12.5*  HGB 16.0 14.5  HCT 44.3 40.2  MCV 95.7 93.3  PLT 233 217    Cardiac Enzymes:  Recent Labs Lab 07/08/13 0717  TROPONINI <0.30    BNP: No components found with this basename: POCBNP,   CBG:  Recent  Labs Lab 07/08/13 0158 07/08/13 0723  GLUCAP 145* 128*    Coagulation Studies: No results found for this basename: LABPROT, INR,  in the last 72 hours   Other results: EKG:   NSR, RBBB , no acute changes   Imaging: Dg Chest 2 View  07/07/2013   CLINICAL DATA:  Chest pressure for 1 month.  EXAM: CHEST  2 VIEW  COMPARISON:  PA and lateral chest 07/09/2012.  FINDINGS: The patient is status post CABG. Lungs are clear. No pneumothorax pleural effusion. Heart size is normal. Thoracic spondylosis is noted.  IMPRESSION: No acute disease.   Electronically Signed   By: Inge Rise M.D.   On: 07/07/2013 21:27         Assessment & Plan: 1. CAD:  Mr. Jason [name] has a long hx of CAD.  He now presents with worsening angina symptoms - chest pressure, left arm pain / numbness and worsening dyspnea.  These symptoms are similar to his previous symptoms.  Will schedule him for cardiac cath.  I have discussed risks, benefits, options.  He understands are agrees to proceed.  His other medical problems will be addressed by MTS.       Thayer Headings, Brooke Bonito., MD, [facility] 07/08/2013, 10:40 AM

## 2013-07-08 NOTE — CV Procedure (Signed)
    Cardiac Catheterization Procedure Note  Name: Jason [name] MRN: [id number] DOB: 09-29-1950  Procedure: Left Heart Cath, Selective Coronary Angiography, LV angiography, SVG angio, LIMA angio  Indication: Unstable angina. Pt with CAD s/p PCI and ultimately CABG. Known to have occluded native RCA and graft to RCA distribution. Presented with typical symptoms of unstable angina.   Procedural Details: The left wrist was prepped, draped, and anesthetized with 1% lidocaine. Using the modified Seldinger technique, a 5 French sheath was introduced into the left radial artery. 3 mg of verapamil was administered through the sheath, weight-based unfractionated heparin was administered intravenously. Standard Judkins catheters were used for selective coronary angiography, SVG angiography, LIMA angiography, and left ventriculography. Catheter exchanges were performed over an exchange length guidewire. There were no immediate procedural complications. A TR band was used for radial hemostasis at the completion of the procedure.  The patient was transferred to the post catheterization recovery area for further monitoring.  Procedural Findings: Hemodynamics: AO 100/54 LV 109/9  Coronary angiography: Coronary dominance: right  Left mainstem: 70% ostial and distal LM stenosis  Left anterior descending (LAD): moderately calcified. 80-90% stenosis in the mid-vessel with competitive filling from the LIMA graft distally.  Left circumflex (LCx): AV circ is small but patent. The OM branches are occluded and fill from the SVG-OM1 and OM2  Right coronary artery (RCA): Extensively stented. Total occlusion after the conus branch. Distal branches fill from left-right collaterals  LIMA-LAD: widely patent  SVG-OM1 and OM 2: widely patent throughout  SVG-PDA/PLA: known to be occluded  Left ventriculography: Basal inferior akinesis, no significant MR, LVEF estimated at 55%  Final Conclusions:   1. Severe  3 vessel CAD 2. S/P CABG with continued patency of the LIMA-LAD and SVG-OM1&OM2 3. Known occlusion of the native RCA and SVG - RCA branches with presence of left-right collaterals 4. Milc LV contraction abnormality with preserved overall LVEF  Recommendations: continued medical therapy. Anticipate d/c home tomorrow am.  [name] 07/08/2013, 5:52 PM

## 2013-07-08 NOTE — H&P (Signed)
FMTS Attending Note  I personally saw and evaluated the patient. The plan of care was discussed with the resident team. I agree with the assessment and plan as documented by the resident.   Slaton Reaser MD 

## 2013-07-08 NOTE — H&P (Signed)
FMTS Attending Note  I personally saw and evaluated the patient. The plan of care was discussed with the resident team. I agree with the assessment and plan as documented by the resident. Patient evaluated at 12:30 PM on 07/08/13.  63 y/o male with PMH CAD, CABG 2011, HTN, DM, HLD presents with chief complaint of chest pain, patient has had intermittent substernal chest pain for the past few month, presented with 1.5 day history of substernal chest pain, radiation to left arm, similar in pain to previous MI, associated fatigue and nausea, had mild improvement with ASA and nitroglycerin at home, please see resident note for further HPI. Patient is currently asymptomatic, resting comfortably, no sob, no nausea, no abdominal pain  Vitals: reviewed Gen: pleasant Caucasian male, NAD HEENT: pupils equal, no scleral icterus, nasal septum midline, MMM, uvula midline, neck supple, no anterior or posterior cervical lymphadenopathy Cardiac: RRR, S1 and S2 present, no murmurs, no heaves/thrills Resp: CTAB, normal effort Abd: soft, no tenderness, normal bowel sounds Ext: no edema, 2+radial pulses, 2+ DP pulses Skin: no rash  Reviewed imaging, lab work, and EKG (no acute ischemic changes, NSR, Bifasicular block)  1. Chest pain with previous CABG - ischemic workup negative however given history Cardiology has elected to take patient for cardiac catheterization this evening, agree with plan as outlined in the resident note 2. HTN - controlled on Metoprolol and Lisinopril 3. Type 2 DM - check A1C 4. Anxiety - Xanax as needed 5. Tobacco Abuse - smoking cessation counseling  Dossie Arbour MD

## 2013-07-08 NOTE — ED Provider Notes (Signed)
I saw and evaluated the patient, reviewed the resident's note and I agree with the findings and plan.    Above ecg interpreted by me. Pt w hx cad, presents w mid cp for the past couple days. Not pleuritic. Chest cta. Non tender. Labs. Ecg. Cxr.    Mirna Mires, MD 07/08/13 1330

## 2013-07-09 MED ORDER — CARVEDILOL 6.25 MG PO TABS: 6.2500 mg | ORAL_TABLET | Freq: Two times a day (BID) | ORAL | Status: DC

## 2013-07-09 MED ORDER — LOVASTATIN 20 MG PO TABS: 20.0000 mg | ORAL_TABLET | Freq: Every day | ORAL | Status: DC

## 2013-07-09 NOTE — Discharge Summary (Signed)
Family Medicine Teaching Service Discharge Summary Service Pager: [phone]  Jason [name] 63 y.o. male  MRN: [id number]  DOB: [birth year]    LOS: 2 days  Full Code PCP: [name], MD  CONSULTANTS: Cardiology - Primary Cardiologist = Dr. Sallyanne Kuster   PATIENT OVERVIEW:  Admitted on: 07/07/2013 Reason for admission: Mixed Typical and Atypical Chest Pain X1.5 days with reported decreased functional status X 1.5 months. His PMHx is significant for CAD s/p 4 Vessel CABG, tobacco abuse, anxiety, chronic back pain.   Brief Hospital Course: 1/17 - CATH = Patent grafts, known occlusion of RCA with collaterals, recommend medical treatment  SUBJECTIVE: Patient reports feeling well.  No current complaints.  Wanting to go home. Patient will need for her medications.  Reports intolerance to metoprolol the past and has not been taking.  OBJECTIVE: Temp:  [97.4 F (36.3 C)-98.3 F (36.8 C)] 98.3 F (36.8 C) (01/17 0514) Pulse Rate:  [56-79] 75 (01/17 0514) Resp:  [18] 18 (01/17 0514) BP: (90-126)/(45-80) 118/56 mmHg (01/17 0514) SpO2:  [94 %-98 %] 98 % (01/17 0514) BASELINE WEIGHT:  164#    Filed Weights   07/07/13 2038 07/08/13 0157  Weight: 165 lb (74.844 kg) 164 lb 12.8 oz (74.753 kg)     Physical Exam:  GENERAL: Adult Caucasian  male. In no discomfort; no respiratory distress  PSYCH: alert and appropriate, good insight   HNEENT:  no JVD   CARDIO: RRR, S1/S2 heard, no murmur  LUNGS: CTA B, no wheezes, no crackles  ABDOMEN: +BS, soft, non-tender, no rigidity, no guarding, no masses/hepatosplenomegaly  EXTREM:  Warm, well perfused.  Moves all 4 extremities spontaneously; no lateralization.  No noted foot lesions.  Distal pulses 1+/4.  trace pretibial edema.  GU:   SKIN:    EKG:  1/16 - NSR, RBBB, No acute ST changes  LABS:  Basic Labs Extended:   Recent Labs Lab 07/07/13 2033 07/08/13 0717  WBC 13.0* 12.5*  HGB 16.0 14.5  HCT 44.3 40.2  PLT 233 217    Recent Labs Lab  07/07/13 2033 07/08/13 0717  NA 134* 135*  K 3.9 4.3  CL 95* 99  CO2 20 21  BUN 11 11  CREATININE 1.07 1.15  CALCIUM 10.0 9.6  GLUCOSE 176* 129*    Recent Labs  10/01/12 1531 07/08/13 0717 07/08/13 1345  HGBA1C 6.5 7.6*  --   TRIG  --  385*  --   CHOL  --  234*  --   HDL  --  34*  --   LDLCALC  --  123*  --   TSH  --   --  3.425     Recent Labs Lab 07/07/13 2033 07/07/13 2107 07/08/13 0717 07/08/13 1345 07/08/13 1905  TROPIPOC  --  0.00  --   --   --   TROPONINI  --   --  <0.30 <0.30 <0.30  PROBNP 77.9  --   --   --   --       Urinalysis: Further Urine Studies:  1/16 - Unremarkable    IMAGING: 1/15 - 2V CXR - negative for acute findings  Pending Results: None  Discharge Medications:   Medication List    STOP taking these medications       metoprolol 50 MG tablet  Commonly known as:  LOPRESSOR     naproxen sodium 220 MG tablet  Commonly known as:  ANAPROX      TAKE these medications  ALPRAZolam 0.5 MG tablet  Commonly known as:  XANAX  Take 0.5 mg by mouth 2 (two) times daily as needed for anxiety (or panic attacks).     aspirin 325 MG tablet  Take 325 mg by mouth daily.     carvedilol 6.25 MG tablet  Commonly known as:  COREG  Take 1 tablet (6.25 mg total) by mouth 2 (two) times daily with a meal.     isosorbide mononitrate 30 MG 24 hr tablet  Commonly known as:  IMDUR  Take 30 mg by mouth daily.     lisinopril 10 MG tablet  Commonly known as:  PRINIVIL,ZESTRIL  Take 10 mg by mouth daily.     lovastatin 20 MG tablet  Commonly known as:  MEVACOR  Take 1 tablet (20 mg total) by mouth at bedtime.     metFORMIN 500 MG tablet  Commonly known as:  GLUCOPHAGE  Take 1-2 tablets (500-1,000 mg total) by mouth 2 (two) times daily with a meal. If cbg <150, takes 1 tablet.  If cbg>150, takes 2 tablet     nitroGLYCERIN 0.4 MG SL tablet  Commonly known as:  NITROSTAT  Place 1 tablet (0.4 mg total) under the tongue every 5 (five)  minutes x 3 doses as needed. For chest pain.     oxyCODONE-acetaminophen 7.5-325 MG per tablet  Commonly known as:  PERCOCET  Take 1 tablet by mouth every 8 (eight) hours as needed for pain.     ranitidine 150 MG tablet  Commonly known as:  ZANTAC  Take 150 mg by mouth daily as needed. For acid reflux       Future Appointment information: Follow-up Information   Schedule an appointment as soon as possible for a visit with Jenny Reichmann, MD.   Specialty:  Family Medicine   Contact information:   [place] [address] [phone]       Schedule an appointment as soon as possible for a visit with Sanda Klein, MD.   Specialty:  Cardiology   Contact information:   [address] [place] [address] [phone]      Follow up Issues: 1. Medication compliance.    Needs to be on high potency statin given known CAD but unable to afford.  Started on lovastatin.  Be sure tolerating.  Change from metoprolol to Coreg given patient complained of intolerance to metoprolol 2.  Smoking Cessation 3.  Anxiety 4. diabetic control  Details of Hospitalization: Principal Problem:   Chest pain Active Problems:   CAD, CABG Feb 2011, cath 10/03/11-medical Rx   Tobacco abuse disorder   DM (diabetes mellitus)   HTN (hypertension)   Hyperlipidemia   Substance abuse in remission  Dates of Hospitalization: 07/07/2013 to 07/09/13 Length of Stay: 2 days Disposition: To Home Discharge Diet: Heart Healthy Discharge Condition: Improved Discharge Activity: Ad lib  Chest pain -Patient with typical and atypical features.  Had been occuring on and off for past 1.5 months admitted with persistent for 1.5 days.  Improved with ASA, Nitro, Rest.   - Pain resolved early in hospitalization. - Recommend daily ASA, statin, beta blocker, smoking cessation, weight loss, improved diabetic control.  Hypertension - Continue lisinopril, appropriate during hospitalization;  goal of less than 130/90  Diabetes mellitus type 2 - A1c and lipids as above. - Restart statin. - Needs improved diabetes control, followup with PCP  Anxiety disorder/panic attacks -Continued on Xanax  -UDS negative for cocaine (hx +)  Leukocytosis -CXR neg  infiltrate, UA unremarkable -afebrile and hemodynamically stable  Tobacco abuse -Tobacco cessation discussed - defer patch but available upon request  Lumbar spondylolisthesis -Status post lumbar fusion -Continued Percocet  Gerda Diss, DO [facility] Resident - PGY- 3  07/09/2013 12:01 PM

## 2013-07-09 NOTE — Progress Notes (Signed)
Subjective:  His wrist is fine at the present time. No recurrent ischemic chest pain  Objective:  Vital Signs in the last 24 hours: BP 118/56  Pulse 75  Temp(Src) 98.3 F (36.8 C) (Oral)  Resp 18  Ht 5\' 9"  (1.753 m)  Wt 74.753 kg (164 lb 12.8 oz)  BMI 24.33 kg/m2  SpO2 98%  Physical Exam: Pleasant white male in no acute distress Lungs:  Clear Cardiac:  Regular rhythm, normal S1 and S2, no S3 Extremities:  Radial cath site clean and dry  Intake/Output from previous day: 01/16 0701 - 01/17 0700 In: 1628.5 [P.O.:960; I.V.:668.5] Out: -   Weight Filed Weights   07/07/13 2038 07/08/13 0157  Weight: 74.844 kg (165 lb) 74.753 kg (164 lb 12.8 oz)    Lab Results: Basic Metabolic Panel:  Recent Labs  07/07/13 2033 07/08/13 0717  NA 134* 135*  K 3.9 4.3  CL 95* 99  CO2 20 21  GLUCOSE 176* 129*  BUN 11 11  CREATININE 1.07 1.15   CBC:  Recent Labs  07/07/13 2033 07/08/13 0717  WBC 13.0* 12.5*  HGB 16.0 14.5  HCT 44.3 40.2  MCV 95.7 93.3  PLT 233 217   Cardiac Enzymes:  Recent Labs  07/08/13 0717 07/08/13 1345 07/08/13 1905  TROPONINI <0.30 <0.30 <0.30    Telemetry: Reviewed   Assessment/Plan:  1. Chest pain with atypical features with patent grafts and a known occlusion of the right coronary artery with collaterals  Recommendations:  He may followup with Dr. Sallyanne Kuster on discharge. May go home when stable from his other medical issues.     Kerry Hough  MD [facility] Card[facility]  07/09/2013, 11:15 AM

## 2013-07-09 NOTE — Discharge Summary (Signed)
FMTS Attending Note  I personally saw and evaluated the patient. The plan of care was discussed with the resident team. I agree with the assessment and plan as documented by the resident.   Patient without chest pain this morning.  Stable for discharge. Agree with addition of Lovastatin and transition to Coreg. Outpatient follow up with cardiology and PCP.  Dossie Arbour MD

## 2013-07-09 NOTE — Discharge Instructions (Signed)
Stop taking Aleeve/Ibuprofen Changing heart medication to one that you should tolerate better and is still 4$. Start 4$ Cholesterol medication - Lovastatin.    Talk to Dr. Everlene Farrier about 90 day supplies one on stable dose (may need adjustment)  STOP SMOKING  Chest Pain (Nonspecific) It is often hard to give a specific diagnosis for the cause of chest pain. There is always a chance that your pain could be related to something serious, such as a heart attack or a blood clot in the lungs. You need to follow up with your caregiver for further evaluation. CAUSES   Heartburn.  Pneumonia or bronchitis.  Anxiety or stress.  Inflammation around your heart (pericarditis) or lung (pleuritis or pleurisy).  A blood clot in the lung.  A collapsed lung (pneumothorax). It can develop suddenly on its own (spontaneous pneumothorax) or from injury (trauma) to the chest.  Shingles infection (herpes zoster virus). The chest wall is composed of bones, muscles, and cartilage. Any of these can be the source of the pain.  The bones can be bruised by injury.  The muscles or cartilage can be strained by coughing or overwork.  The cartilage can be affected by inflammation and become sore (costochondritis). DIAGNOSIS  Lab tests or other studies, such as X-rays, electrocardiography, stress testing, or cardiac imaging, may be needed to find the cause of your pain.  TREATMENT   Treatment depends on what may be causing your chest pain. Treatment may include:  Acid blockers for heartburn.  Anti-inflammatory medicine.  Pain medicine for inflammatory conditions.  Antibiotics if an infection is present.  You may be advised to change lifestyle habits. This includes stopping smoking and avoiding alcohol, caffeine, and chocolate.  You may be advised to keep your head raised (elevated) when sleeping. This reduces the chance of acid going backward from your stomach into your esophagus.  Most of the time,  nonspecific chest pain will improve within 2 to 3 days with rest and mild pain medicine. HOME CARE INSTRUCTIONS   If antibiotics were prescribed, take your antibiotics as directed. Finish them even if you start to feel better.  For the next few days, avoid physical activities that bring on chest pain. Continue physical activities as directed.  Do not smoke.  Avoid drinking alcohol.  Only take over-the-counter or prescription medicine for pain, discomfort, or fever as directed by your caregiver.  Follow your caregiver's suggestions for further testing if your chest pain does not go away.  Keep any follow-up appointments you made. If you do not go to an appointment, you could develop lasting (chronic) problems with pain. If there is any problem keeping an appointment, you must call to reschedule. SEEK MEDICAL CARE IF:   You think you are having problems from the medicine you are taking. Read your medicine instructions carefully.  Your chest pain does not go away, even after treatment.  You develop a rash with blisters on your chest. SEEK IMMEDIATE MEDICAL CARE IF:   You have increased chest pain or pain that spreads to your arm, neck, jaw, back, or abdomen.  You develop shortness of breath, an increasing cough, or you are coughing up blood.  You have severe back or abdominal pain, feel nauseous, or vomit.  You develop severe weakness, fainting, or chills.  You have a fever. THIS IS AN EMERGENCY. Do not wait to see if the pain will go away. Get medical help at once. Call your local emergency services (911 in [place] Do not drive yourself  to the hospital. MAKE SURE YOU:   Understand these instructions.  Will watch your condition.  Will get help right away if you are not doing well or get worse. Document Released: 03/19/2005 Document Revised: 09/01/2011 Document Reviewed: 01/13/2008 [facility] Patient Information 2014 [place].

## 2013-07-11 LAB — GLUCOSE, CAPILLARY: Glucose-Capillary: 131 mg/dL — ABNORMAL HIGH (ref 70–99)

## 2013-07-26 ENCOUNTER — Encounter: Payer: Self-pay | Admitting: Emergency Medicine

## 2013-07-26 ENCOUNTER — Ambulatory Visit: Payer: Self-pay | Admitting: Emergency Medicine

## 2013-07-26 VITALS — BP 138/88 | HR 85 | Temp 97.5°F | Resp 16 | Ht 68.5 in | Wt 161.4 lb

## 2013-07-26 DIAGNOSIS — E119 Type 2 diabetes mellitus without complications: Secondary | ICD-10-CM

## 2013-07-26 LAB — POCT GLYCOSYLATED HEMOGLOBIN (HGB A1C): Hemoglobin A1C: 6.9

## 2013-07-26 MED ORDER — SERTRALINE HCL 50 MG PO TABS: 50.0000 mg | ORAL_TABLET | Freq: Every day | ORAL | Status: DC

## 2013-07-26 MED ORDER — ALPRAZOLAM 0.5 MG PO TABS: 0.5000 mg | ORAL_TABLET | Freq: Two times a day (BID) | ORAL | Status: DC | PRN

## 2013-07-26 NOTE — Progress Notes (Signed)
Subjective:    Patient ID: Jason [name], male    DOB: Aug 12, 1950, 63 y.o.   MRN: [id number] This chart was scribed for [name], MD by Rolanda Lundborg, ED Scribe. This patient was seen in room 21 and the patient's care was started at 3:17 PM.  Chief Complaint  Patient presents with  . Follow-up    chest pain ED visit 06/2013    HPI HPI Comments: Jason [name] is a 63 y.o. male with a h/o CAD, CABG, MI, DM, COPD, RBBB, GERD, anxiety, and depression who presents to the [facility] for a f/u after being seen in the ED for CP on 1/17. He describes the CP as tightness in the center of his chest. He states he tends to have CP when he is experiencing anxiety. He smokes 1 1/2 PPD.   He has been going to church to stay sober and has support from his family now. He reports feeling depressed in the last 2 weeks as he has been living in a motel after he had to move out of his sister-in-law's house. He is also feeling guilty about putting his mother in a nursing home. He is planning on fixing up a broken down 3 bedroom house to make some money. He reports he is not sleeping well due to his depression and anxiety. He states he took 0.5mg  of his friend's Xanax and felt better.   Past Medical History  Diagnosis Date  . Coronary artery disease     CABG 2/01, cath 4/13 - med Rx  . Hypertension     type 2 NIDDM  . Diabetes mellitus   . Arthritis   . GERD (gastroesophageal reflux disease)   . High cholesterol   . Myocardial infarct     x4 last 10 yrs  . Anxiety     off xanax  and paxil since 3/13  . COPD (chronic obstructive pulmonary disease)   . Pneumonia     hx  . Cancer     tumor basal cell rem from lft arm  . Smoker   . Anxiety   . 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 Depression   . Substance abuse   . Cataract   . Heart attack    Current Outpatient Prescriptions on File Prior to Visit  Medication Sig Dispense Refill   . ALPRAZolam (XANAX) 0.5 MG tablet Take 0.5 mg by mouth 2 (two) times daily as needed for anxiety (or panic attacks).      Marland Kitchen aspirin 325 MG tablet Take 325 mg by mouth daily.      . carvedilol (COREG) 6.25 MG tablet Take 1 tablet (6.25 mg total) by mouth 2 (two) times daily with a meal.  60 tablet  0  . isosorbide mononitrate (IMDUR) 30 MG 24 hr tablet Take 30 mg by mouth daily.      Marland Kitchen lisinopril (PRINIVIL,ZESTRIL) 10 MG tablet Take 10 mg by mouth daily.       Marland Kitchen lovastatin (MEVACOR) 20 MG tablet Take 1 tablet (20 mg total) by mouth at bedtime.  30 tablet  0  . metFORMIN (GLUCOPHAGE) 500 MG tablet Take 1-2 tablets (500-1,000 mg total) by mouth 2 (two) times daily with a meal. If cbg <150, takes 1 tablet.  If cbg>150, takes 2 tablet  120 tablet  11  . nitroGLYCERIN (NITROSTAT) 0.4 MG SL tablet Place 1 tablet (0.4 mg total) under the tongue every  5 (five) minutes x 3 doses as needed. For chest pain.  25 tablet  2  . oxyCODONE-acetaminophen (PERCOCET) 7.5-325 MG per tablet Take 1 tablet by mouth every 8 (eight) hours as needed for pain.      . ranitidine (ZANTAC) 150 MG tablet Take 150 mg by mouth daily as needed. For acid reflux       No current facility-administered medications on file prior to visit.   Allergies  Allergen Reactions  . Sulfa Antibiotics Nausea And Vomiting and Other (See Comments)    Also headaches     Review of Systems  Constitutional: Negative for fatigue and unexpected weight change.  Eyes: Negative for visual disturbance.  Respiratory: Negative for cough, chest tightness and shortness of breath.   Cardiovascular: Negative for chest pain, palpitations and leg swelling.  Gastrointestinal: Negative for abdominal pain and blood in stool.  Neurological: Negative for dizziness, light-headedness and headaches.  Psychiatric/Behavioral: The patient is nervous/anxious.        Objective:   Physical Exam CONSTITUTIONAL: Well developed/well nourished HEAD:  Normocephalic/atraumatic EYES: EOMI/PERRL ENMT: Mucous membranes moist NECK: supple no meningeal signs SPINE:entire spine nontender CV: S1/S2 noted, no murmurs/rubs/gallops noted LUNGS: Lungs are clear to auscultation bilaterally, no apparent distress ABDOMEN: soft, nontender, no rebound or guarding GU:no cva tenderness NEURO: Pt is awake/alert, moves all extremitiesx4 EXTREMITIES: pulses normal, full ROM SKIN: warm, color normal PSYCH: no abnormalities of mood noted  Filed Vitals:   07/26/13 1516  BP: 138/88  Pulse: 85  Temp: 97.5 F (36.4 C)  TempSrc: Oral  Resp: 16  Height: 5' 8.5" (1.74 m)  Weight: 161 lb 6.4 oz (73.211 kg)  SpO2: 96%         Assessment & Plan:   Patient is doing well. He has not been using drugs. He's been going to church regularly and getting help from the minister there. He does have worsening depression anxiety. I am encouraged by him staying off of drugs. He did recently have a hospitalization with repeat catheterization. I have encouraged him to keep his followup with Dr. Helene Shoe

## 2013-09-05 ENCOUNTER — Other Ambulatory Visit (HOSPITAL_COMMUNITY): Payer: Self-pay | Admitting: Sports Medicine

## 2013-09-20 ENCOUNTER — Encounter: Payer: Self-pay | Admitting: Emergency Medicine

## 2013-09-20 ENCOUNTER — Ambulatory Visit: Payer: Self-pay | Admitting: Emergency Medicine

## 2013-09-20 VITALS — BP 133/70 | HR 72 | Temp 97.3°F | Resp 16 | Ht 69.0 in | Wt 165.0 lb

## 2013-09-20 DIAGNOSIS — F411 Generalized anxiety disorder: Secondary | ICD-10-CM

## 2013-09-20 DIAGNOSIS — Z1211 Encounter for screening for malignant neoplasm of colon: Secondary | ICD-10-CM

## 2013-09-20 DIAGNOSIS — E119 Type 2 diabetes mellitus without complications: Secondary | ICD-10-CM

## 2013-09-20 DIAGNOSIS — R197 Diarrhea, unspecified: Secondary | ICD-10-CM

## 2013-09-20 LAB — CBC
HEMATOCRIT: 47.1 % (ref 39.0–52.0)
Hemoglobin: 16.5 g/dL (ref 13.0–17.0)
MCH: 33 pg (ref 26.0–34.0)
MCHC: 35 g/dL (ref 30.0–36.0)
MCV: 94.2 fL (ref 78.0–100.0)
Platelets: 280 10*3/uL (ref 150–400)
RBC: 5 MIL/uL (ref 4.22–5.81)
RDW: 13.3 % (ref 11.5–15.5)
WBC: 15.1 10*3/uL — ABNORMAL HIGH (ref 4.0–10.5)

## 2013-09-20 LAB — IFOBT (OCCULT BLOOD): IFOBT: NEGATIVE

## 2013-09-20 LAB — POCT GLYCOSYLATED HEMOGLOBIN (HGB A1C): Hemoglobin A1C: 6.6

## 2013-09-20 LAB — GLUCOSE, POCT (MANUAL RESULT ENTRY): POC Glucose: 209 mg/dl — AB (ref 70–99)

## 2013-09-20 MED ORDER — ALPRAZOLAM 0.5 MG PO TABS: 0.5000 mg | ORAL_TABLET | Freq: Two times a day (BID) | ORAL | Status: DC | PRN

## 2013-09-20 MED ORDER — SERTRALINE HCL 50 MG PO TABS: ORAL_TABLET | ORAL | Status: DC

## 2013-09-20 NOTE — Progress Notes (Signed)
This chart was scribed for Jason Russian, MD by Eston Mould, ED Scribe. This patient was seen in room Room/bed 22 and the patient's care was started at 3:19 PM. Subjective:    Patient ID: Jason [name], male    DOB: [birth year], 63 y.o.   MRN: [id number] Chief Complaint  Patient presents with  . Follow-up    depression/Zoloft, pt states this is giving him diarrhea   HPI Jason [name] is a 63 y.o. male who presents to the [facility] for F/U apt. Pt states he has been well but states he has not been able to get over the depression and reports having constant diarrhea. He states he recently visited his mother at an assisted living facility and suspects he came in contact "with a virus then due to having other residents in the facility with a virus" but states the diarrhea has continued. Pt is unsure if Zoloft is the reason for his diarrhea. He states he has taken Zoloft previously when he attempted suicide. Pt states the attempt was several years ago and states it was due to overwhelming anxiety attacks. Pt states he has suicidal thoughts everyday since he was 26 but states he has only attempted suicide once. Pt states he lives in a motel near the [place] area. He states he is currently working on a house for him to live in and states the house is in bad condition. He states the house does have mold. Pt states he is still smoking. He reports to have the desire.   Pt denies ever having a colonoscopy done due to financial reasons. Pt reports having seasonal allergies but states he has been taking Loratadine. Pt denies having CP.  Review of Systems  Constitutional: Negative for fever.  Gastrointestinal: Positive for diarrhea. Negative for nausea and vomiting.  Psychiatric/Behavioral: Positive for suicidal ideas.  All other systems reviewed and are negative.   Objective:   Physical Exam CONSTITUTIONAL: Well developed/well nourished.  HEAD: Normocephalic/atraumatic EYES:  EOMI/PERRL ENMT: Mucous membranes moist NECK: supple no meningeal signs SPINE:entire spine nontender CV: S1/S2 noted, no murmurs/rubs/gallops noted LUNGS: Lungs are clear to auscultation bilaterally, no apparent distress ABDOMEN: soft, nontender, no rebound or guarding.  GU:no cva tenderness. Rectal exam had a normal prostate with brown stool. NEURO: Pt is awake/alert, moves all extremitiesx4 EXTREMITIES: pulses normal, full ROM SKIN: warm, color normal. Actinic changes to face.Sternotomy scar. PSYCH: no abnormalities of mood noted.Patients affect appears depressed.  Results for orders placed in visit on 09/20/13  GLUCOSE, POCT (MANUAL RESULT ENTRY)      Result Value Ref Range   POC Glucose 209 (*) 70 - 99 mg/dl  IFOBT (OCCULT BLOOD)      Result Value Ref Range   IFOBT Negative     Triage Vitals:BP 133/70  Pulse 72  Temp(Src) 97.3 F (36.3 C)  Resp 16  Ht 5\' 9"  (1.753 m)  Wt 165 lb (74.844 kg)  BMI 24.36 kg/m2  SpO2 96% Assessment & Plan:  Please increase the Zoloft to 75 mg a day. I did refill her Xanax. I told him I did not want him taking the oxycodone for pain. He is to call me if he has any worsening depressive or suicidal thoughts while increasing the dose of Zoloft. I told him to take the Glucophage a half tablet twice a day and see if his GI symptoms improved   I personally performed the services described in this documentation, which was scribed in my presence. The recorded information  has been reviewed and is accurate.

## 2013-09-20 NOTE — Patient Instructions (Signed)
Increase her Zoloft to 1-1/2 tablets a day. Please call if you have worsening suicidal thoughts. Take your Xanax as instructed. Change your Glucophage to a half tablet twice a day and see if your diarrhea improves

## 2013-09-21 ENCOUNTER — Other Ambulatory Visit: Payer: Self-pay | Admitting: *Deleted

## 2013-09-21 DIAGNOSIS — K529 Noninfective gastroenteritis and colitis, unspecified: Secondary | ICD-10-CM

## 2013-09-21 MED ORDER — CIPROFLOXACIN HCL 500 MG PO TABS: 500.0000 mg | ORAL_TABLET | Freq: Two times a day (BID) | ORAL | Status: DC

## 2013-10-01 ENCOUNTER — Other Ambulatory Visit: Payer: Self-pay | Admitting: Physician Assistant

## 2013-11-16 ENCOUNTER — Other Ambulatory Visit: Payer: Self-pay | Admitting: Emergency Medicine

## 2013-11-18 ENCOUNTER — Ambulatory Visit: Payer: Self-pay | Admitting: Family Medicine

## 2013-11-18 VITALS — BP 154/66 | HR 66 | Temp 97.6°F | Resp 16 | Ht 69.0 in | Wt 170.0 lb

## 2013-11-18 DIAGNOSIS — I25810 Atherosclerosis of coronary artery bypass graft(s) without angina pectoris: Secondary | ICD-10-CM

## 2013-11-18 DIAGNOSIS — R197 Diarrhea, unspecified: Secondary | ICD-10-CM

## 2013-11-18 DIAGNOSIS — I10 Essential (primary) hypertension: Secondary | ICD-10-CM

## 2013-11-18 DIAGNOSIS — R1110 Vomiting, unspecified: Secondary | ICD-10-CM

## 2013-11-18 DIAGNOSIS — F341 Dysthymic disorder: Secondary | ICD-10-CM

## 2013-11-18 DIAGNOSIS — F418 Other specified anxiety disorders: Secondary | ICD-10-CM

## 2013-11-18 DIAGNOSIS — F411 Generalized anxiety disorder: Secondary | ICD-10-CM

## 2013-11-18 LAB — POCT CBC
Granulocyte percent: 73.9 %G (ref 37–80)
HCT, POC: 48.4 % (ref 43.5–53.7)
Hemoglobin: 16.3 g/dL (ref 14.1–18.1)
Lymph, poc: 2.6 (ref 0.6–3.4)
MCH, POC: 32.1 pg — AB (ref 27–31.2)
MCHC: 33.7 g/dL (ref 31.8–35.4)
MCV: 95.3 fL (ref 80–97)
MID (cbc): 0.6 (ref 0–0.9)
MPV: 7.4 fL (ref 0–99.8)
POC Granulocyte: 9.1 — AB (ref 2–6.9)
POC LYMPH PERCENT: 21.4 %L (ref 10–50)
POC MID %: 4.7 %M (ref 0–12)
Platelet Count, POC: 334 10*3/uL (ref 142–424)
RBC: 5.08 M/uL (ref 4.69–6.13)
RDW, POC: 13.3 %
WBC: 12.3 10*3/uL — AB (ref 4.6–10.2)

## 2013-11-18 MED ORDER — DICYCLOMINE HCL 10 MG PO CAPS: 10.0000 mg | ORAL_CAPSULE | Freq: Three times a day (TID) | ORAL | Status: DC

## 2013-11-18 MED ORDER — LISINOPRIL 10 MG PO TABS: 10.0000 mg | ORAL_TABLET | Freq: Every day | ORAL | Status: DC

## 2013-11-18 MED ORDER — ALPRAZOLAM 0.5 MG PO TABS: 0.5000 mg | ORAL_TABLET | Freq: Two times a day (BID) | ORAL | Status: DC | PRN
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
## 2013-11-18 NOTE — Patient Instructions (Addendum)
Take the dicyclomine maximum of 4 pills daily with meals and at bedtime if needed for nausea or loose stools  Resume your Xanax, but do not exceed 2 pills daily. You must see Dr. Everlene Farrier before these prescriptions are used up.  If any stomach symptoms get worse come in sooner.  If he depression is worse at anytime come in or go to the emergency room.

## 2013-12-24 ENCOUNTER — Ambulatory Visit (INDEPENDENT_AMBULATORY_CARE_PROVIDER_SITE_OTHER): Payer: Self-pay | Admitting: Emergency Medicine

## 2013-12-24 VITALS — BP 130/70 | HR 74 | Temp 97.6°F | Resp 18 | Ht 68.75 in | Wt 165.0 lb

## 2013-12-24 DIAGNOSIS — E119 Type 2 diabetes mellitus without complications: Secondary | ICD-10-CM

## 2013-12-24 DIAGNOSIS — F411 Generalized anxiety disorder: Secondary | ICD-10-CM

## 2013-12-24 DIAGNOSIS — R079 Chest pain, unspecified: Secondary | ICD-10-CM

## 2013-12-24 LAB — GLUCOSE, POCT (MANUAL RESULT ENTRY): POC GLUCOSE: 147 mg/dL — AB (ref 70–99)

## 2013-12-24 MED ORDER — ALPRAZOLAM 0.5 MG PO TABS: 0.5000 mg | ORAL_TABLET | Freq: Two times a day (BID) | ORAL | Status: DC | PRN

## 2013-12-24 MED ORDER — DOXYCYCLINE HYCLATE 100 MG PO CAPS
100.0000 mg | ORAL_CAPSULE | Freq: Two times a day (BID) | ORAL | Status: DC
Start: 2013-12-24 — End: 2014-01-16

## 2013-12-24 NOTE — Progress Notes (Signed)
Subjective:  This chart was scribed for Remo Lipps A. Euclide Granito MD, by Stacy Gardner, [facility], [place] Scribe. The patient was seen in room and the patient's care was started at 2:24 PM.   Patient ID: Jason [name], male    DOB: Sep 07, 1950, 63 y.o.   MRN: [id number] Chief Complaint  Patient presents with  . Medication Refill    alprazolam  . Cyst    under both arms    HPI HPI Comments: Jason [name] is a 63 y.o. male who arrives to the [facility] complaining of cyst under his bilateral arms. Pt requests medication refill of alprazolam. He complains of "up and down" depression and anxiety. Pt reports that he is still drug free. He is working with his brother. Pt saw Linna Darner a month ago and was overwhelmed with the house. He had severe panic attacks with associated chest pains.During the attacks he has pressure in his chest and SOB.  He felt the pressure within his head and ears. Pt had dizzy spells. The next day he vomited throughout the night. He is openly communicating with his brother. He has tried getting with his support team which consists of his brother, family, lifelong friends and his best friend. Pt states that he cries in stores and has to walkout. He feels that " God is cleaning me out.". He is taking Zoloft twice in the morning, 50 mg and twice at night, 50 mg. He is taking two xanax a day.   Pt reports Dr. Laurena Bering extracted his tooth and he immediately went to get a prescription of antibiotics. Soon after he had clusters of boils under his arms. He was working on sheet rocks in a bathroom prior to the presence of the boils. He tried cutting open the boils. Pt reports he feels absolute exhausted. He is drinking plenty of water and staying away from Diet Pepsi. He had a low grade fever and headache.   Patient Active Problem List   Diagnosis Date Noted  . Chest pain 07/08/2013  . Substance abuse in remission 10/01/2012  . Radiculitis  02/10/2012  . CAD, CABG Feb 2011, cath 10/03/11-medical Rx 10/03/2011  . PVD, hx Rt femoral endarterectomy 2004 10/03/2011  . Tobacco abuse disorder 10/02/2011  . DM (diabetes mellitus) 10/02/2011  . HTN (hypertension) 10/02/2011  . Hyperlipidemia 10/02/2011   Past Medical History  Diagnosis Date  . Coronary artery disease     CABG 2/01, cath 4/13 - med Rx  . Hypertension     type 2 NIDDM  . Diabetes mellitus   . Arthritis   . GERD (gastroesophageal reflux disease)   . High cholesterol   . Myocardial infarct     x4 last 10 yrs  . Anxiety     off xanax  and paxil since 3/13  . COPD (chronic obstructive pulmonary disease)   . Pneumonia     hx  . Cancer     tumor basal cell rem from lft arm  . Smoker   . Anxiety   . 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 Depression   . Substance abuse   . Cataract   . Heart attack    Past Surgical History  Procedure Laterality Date  . Coronary stent placement    . Femoral artery - femoral artery bypass graft Right 2004    femoral enarterectomy  . US extremity*l*      lft arm tumor  removed   . Cardiac catheterization  4/12 and 4/13    Medical Rx  . Coronary angioplasty  Jan 2004    RCA  . Back surgery  Jan 2014    Dr Vertell Limber  . Coronary artery bypass graft  07/24/2009    L-LAD, SVG-RI/OM, SVG-PDA  . Eye surgery      cat bil   Allergies  Allergen Reactions  . Coreg [Carvedilol] Nausea And Vomiting and Other (See Comments)    Per patient made him dizzy and light sensitive  . Sulfa Antibiotics Nausea And Vomiting and Other (See Comments)    Also headaches    Prior to Admission medications   Medication Sig Start Date End Date Taking? Authorizing Provider  ALPRAZolam Duanne Moron) 0.5 MG tablet Take 1 tablet (0.5 mg total) by mouth 2 (two) times daily as needed for anxiety (or panic attacks). 11/18/13  Yes Posey Boyer, MD  amoxicillin (AMOXIL) 500 MG capsule Take 500 mg by mouth 3 (three) times daily.    Yes Historical Provider, MD  aspirin 325 MG tablet Take 325 mg by mouth daily.   Yes Historical Provider, MD  dicyclomine (BENTYL) 10 MG capsule Take 1 capsule (10 mg total) by mouth 4 (four) times daily -  before meals and at bedtime. 11/18/13  Yes Posey Boyer, MD  isosorbide mononitrate (IMDUR) 30 MG 24 hr tablet Take 30 mg by mouth daily.   Yes Historical Provider, MD  lisinopril (PRINIVIL,ZESTRIL) 10 MG tablet Take 1 tablet (10 mg total) by mouth daily. 11/18/13  Yes Posey Boyer, MD  loratadine (CLARITIN) 10 MG tablet Take 10 mg by mouth daily.   Yes Historical Provider, MD  lovastatin (MEVACOR) 20 MG tablet TAKE 1 TABLET (20 MG TOTAL) BY MOUTH AT BEDTIME.   Yes [name], MD  metFORMIN (GLUCOPHAGE) 500 MG tablet TAKE 1 TO 2 TABLETS BY MOUTH TWICE A DAY WITH MEALS AS DIRECTED 11/16/13  Yes Mancel Bale, PA-C  nitroGLYCERIN (NITROSTAT) 0.4 MG SL tablet Place 1 tablet (0.4 mg total) under the tongue every 5 (five) minutes x 3 doses as needed. For chest pain. 10/04/11  Yes Luke K Kilroy, PA-C  ranitidine (ZANTAC) 150 MG tablet Take 150 mg by mouth daily as needed. For acid reflux   Yes Historical Provider, MD  sertraline (ZOLOFT) 50 MG tablet Take 1-1/2 tablets daily 09/20/13  Yes [name], MD   History   Social History  . Marital Status: Divorced    Spouse Name: N/A    Number of Children: N/A  . Years of Education: N/A   Occupational History  . welder-fabricator    Social History Main Topics  . Smoking status: Current Every Day Smoker -- 1.00 packs/day for 40 years    Types: Cigarettes  . Smokeless tobacco: Never Used  . Alcohol Use: Yes     Comment: socially  . Drug Use: No  . Sexual Activity: Not Currently   Other Topics Concern  . Not on file   Social History Narrative   Exercise walking 4 times per week for 1 mile     Review of Systems  Constitutional: Positive for fever and fatigue.  Skin: Positive for wound.  Neurological: Positive for light-headedness.    Hematological: Positive for adenopathy.  Psychiatric/Behavioral: Positive for dysphoric mood. The patient is nervous/anxious.        Objective:   Physical Exam  Nursing note and vitals reviewed. Constitutional: He is oriented to person, place, and time.  He appears well-developed and well-nourished. No distress.  HENT:  Head: Normocephalic and atraumatic.  Eyes: Conjunctivae and EOM are normal.  Pt has a healing dental extraction site in the right side of his jaw.  cataract  removal surgery.    Neck: Neck supple. No tracheal deviation present.  Cardiovascular: Normal rate, regular rhythm and normal heart sounds.  Exam reveals no gallop and no friction rub.   No murmur heard. Pulmonary/Chest: Effort normal. No respiratory distress.  Decreased breath sounds bilaterally  sternectomy   Musculoskeletal: Normal range of motion.  Neurological: He is alert and oriented to person, place, and time.  Skin: Skin is warm and dry.  Multiple .5 to 1 cm boils in bilateral axillas   Psychiatric: He has a normal mood and affect. His behavior is normal.    Filed Vitals:   12/24/13 1342  BP: 130/70  Pulse: 74  Temp: 97.6 F (36.4 C)  TempSrc: Oral  Resp: 18  Height: 5' 8.75" (1.746 m)  Weight: 165 lb (74.844 kg)  SpO2: 97%  DIAGNOSTIC STUDIES: Oxygen Saturation is 97% on room air, normal by my interpretation.    COORDINATION OF CARE:  2:26 PM Discussed course of care with pt . Pt understands and agrees. Results for orders placed in visit on 12/24/13  GLUCOSE, POCT (MANUAL RESULT ENTRY)      Result Value Ref Range   POC Glucose 147 (*) 70 - 99 mg/dl        Assessment & Plan:   He has cystic-like areas in both axilla. These areas are tender to touch. Will cover with doxycycline for MRSA. His sugars under fair control. I did refill his Xanax. He's been off of drugs now for over a year. I personally performed the services described in this documentation, which was scribed in my  presence. The recorded information has been reviewed and is accurate.

## 2013-12-24 NOTE — Patient Instructions (Signed)
Generalized Anxiety Disorder  Generalized anxiety disorder (GAD) is a mental disorder. It interferes with life functions, including relationships, work, and school.  GAD is different from normal anxiety, which everyone experiences at some point in their lives in response to specific life events and activities. Normal anxiety actually helps us prepare for and get through these life events and activities. Normal anxiety goes away after the event or activity is over.   GAD causes anxiety that is not necessarily related to specific events or activities. It also causes excess anxiety in proportion to specific events or activities. The anxiety associated with GAD is also difficult to control. GAD can vary from mild to severe. People with severe GAD can have intense waves of anxiety with physical symptoms (panic attacks).   SYMPTOMS  The anxiety and worry associated with GAD are difficult to control. This anxiety and worry are related to many life events and activities and also occur more days than not for 6 months or longer. People with GAD also have three or more of the following symptoms (one or more in children):  · Restlessness.    · Fatigue.  · Difficulty concentrating.    · Irritability.  · Muscle tension.  · Difficulty sleeping or unsatisfying sleep.  DIAGNOSIS  GAD is diagnosed through an assessment by your caregiver. Your caregiver will ask you questions about your mood, physical symptoms, and events in your life. Your caregiver may ask you about your medical history and use of alcohol or drugs, including prescription medications. Your caregiver may also do a physical exam and blood tests. Certain medical conditions and the use of certain substances can cause symptoms similar to those associated with GAD. Your caregiver may refer you to a mental health specialist for further evaluation.  TREATMENT  The following therapies are usually used to treat GAD:   · Medication--Antidepressant medication usually is  prescribed for long-term daily control. Antianxiety medications may be added in severe cases, especially when panic attacks occur.    · Talk therapy (psychotherapy)--Certain types of talk therapy can be helpful in treating GAD by providing support, education, and guidance. A form of talk therapy called cognitive behavioral therapy can teach you healthy ways to think about and react to daily life events and activities.  · Stress management techniques--These include yoga, meditation, and exercise and can be very helpful when they are practiced regularly.  A mental health specialist can help determine which treatment is best for you. Some people see improvement with one therapy. However, other people require a combination of therapies.  Document Released: 10/04/2012 Document Reviewed: 10/04/2012  [employer]® Patient Information ©2015 [employer]. This information is not intended to replace advice given to you by your health care provider. Make sure you discuss any questions you have with your health care provider.

## 2014-01-16 ENCOUNTER — Emergency Department (HOSPITAL_COMMUNITY): Payer: No Typology Code available for payment source

## 2014-01-16 ENCOUNTER — Other Ambulatory Visit: Payer: Self-pay | Admitting: Emergency Medicine

## 2014-01-16 ENCOUNTER — Encounter (HOSPITAL_COMMUNITY): Payer: Self-pay | Admitting: Emergency Medicine

## 2014-01-16 ENCOUNTER — Inpatient Hospital Stay (HOSPITAL_COMMUNITY)
Admission: EM | Admit: 2014-01-16 | Discharge: 2014-01-18 | DRG: 287 | Disposition: A | Discharge status: Home / Self Care | Payer: No Typology Code available for payment source | Attending: Cardiovascular Disease | Admitting: Cardiovascular Disease

## 2014-01-16 DIAGNOSIS — I2582 Chronic total occlusion of coronary artery: Secondary | ICD-10-CM | POA: Diagnosis present

## 2014-01-16 DIAGNOSIS — I25110 Atherosclerotic heart disease of native coronary artery with unstable angina pectoris: Secondary | ICD-10-CM

## 2014-01-16 DIAGNOSIS — T82897A Other specified complication of cardiac prosthetic devices, implants and grafts, initial encounter: Principal | ICD-10-CM | POA: Diagnosis present

## 2014-01-16 DIAGNOSIS — Z882 Allergy status to sulfonamides status: Secondary | ICD-10-CM

## 2014-01-16 DIAGNOSIS — K219 Gastro-esophageal reflux disease without esophagitis: Secondary | ICD-10-CM | POA: Diagnosis present

## 2014-01-16 DIAGNOSIS — I25810 Atherosclerosis of coronary artery bypass graft(s) without angina pectoris: Secondary | ICD-10-CM | POA: Diagnosis present

## 2014-01-16 DIAGNOSIS — R079 Chest pain, unspecified: Secondary | ICD-10-CM

## 2014-01-16 DIAGNOSIS — J449 Chronic obstructive pulmonary disease, unspecified: Secondary | ICD-10-CM | POA: Diagnosis present

## 2014-01-16 DIAGNOSIS — Z720 Tobacco use: Secondary | ICD-10-CM | POA: Diagnosis present

## 2014-01-16 DIAGNOSIS — F411 Generalized anxiety disorder: Secondary | ICD-10-CM | POA: Diagnosis present

## 2014-01-16 DIAGNOSIS — Y832 Surgical operation with anastomosis, bypass or graft as the cause of abnormal reaction of the patient, or of later complication, without mention of misadventure at the time of the procedure: Secondary | ICD-10-CM | POA: Diagnosis present

## 2014-01-16 DIAGNOSIS — E119 Type 2 diabetes mellitus without complications: Secondary | ICD-10-CM | POA: Diagnosis present

## 2014-01-16 DIAGNOSIS — F329 Major depressive disorder, single episode, unspecified: Secondary | ICD-10-CM | POA: Diagnosis present

## 2014-01-16 DIAGNOSIS — I2510 Atherosclerotic heart disease of native coronary artery without angina pectoris: Secondary | ICD-10-CM | POA: Diagnosis present

## 2014-01-16 DIAGNOSIS — I10 Essential (primary) hypertension: Secondary | ICD-10-CM | POA: Diagnosis present

## 2014-01-16 DIAGNOSIS — I200 Unstable angina: Secondary | ICD-10-CM | POA: Diagnosis present

## 2014-01-16 DIAGNOSIS — I739 Peripheral vascular disease, unspecified: Secondary | ICD-10-CM | POA: Diagnosis present

## 2014-01-16 DIAGNOSIS — I252 Old myocardial infarction: Secondary | ICD-10-CM

## 2014-01-16 DIAGNOSIS — Z8249 Family history of ischemic heart disease and other diseases of the circulatory system: Secondary | ICD-10-CM

## 2014-01-16 DIAGNOSIS — F17200 Nicotine dependence, unspecified, uncomplicated: Secondary | ICD-10-CM | POA: Diagnosis present

## 2014-01-16 DIAGNOSIS — E785 Hyperlipidemia, unspecified: Secondary | ICD-10-CM | POA: Diagnosis present

## 2014-01-16 DIAGNOSIS — F3289 Other specified depressive episodes: Secondary | ICD-10-CM | POA: Diagnosis present

## 2014-01-16 DIAGNOSIS — Z888 Allergy status to other drugs, medicaments and biological substances status: Secondary | ICD-10-CM

## 2014-01-16 DIAGNOSIS — J4489 Other specified chronic obstructive pulmonary disease: Secondary | ICD-10-CM | POA: Diagnosis present

## 2014-01-16 LAB — BASIC METABOLIC PANEL
Anion gap: 18 — ABNORMAL HIGH (ref 5–15)
BUN: 10 mg/dL (ref 6–23)
CO2: 19 mEq/L (ref 19–32)
CREATININE: 1.09 mg/dL (ref 0.50–1.35)
Calcium: 9.4 mg/dL (ref 8.4–10.5)
Chloride: 91 mEq/L — ABNORMAL LOW (ref 96–112)
GFR calc Af Amer: 83 mL/min — ABNORMAL LOW (ref >90)
GFR, EST NON AFRICAN AMERICAN: 71 mL/min — AB (ref >90)
Glucose, Bld: 143 mg/dL — ABNORMAL HIGH (ref 70–99)
Potassium: 4.2 mEq/L (ref 3.7–5.3)
Sodium: 128 mEq/L — ABNORMAL LOW (ref 137–147)

## 2014-01-16 LAB — CBC
HEMATOCRIT: 44.8 % (ref 39.0–52.0)
Hemoglobin: 15.5 g/dL (ref 13.0–17.0)
MCH: 32.6 pg (ref 26.0–34.0)
MCHC: 34.6 g/dL (ref 30.0–36.0)
MCV: 94.3 fL (ref 78.0–100.0)
Platelets: 223 10*3/uL (ref 150–400)
RBC: 4.75 MIL/uL (ref 4.22–5.81)
RDW: 13.7 % (ref 11.5–15.5)
WBC: 14 10*3/uL — ABNORMAL HIGH (ref 4.0–10.5)

## 2014-01-16 LAB — GLUCOSE, CAPILLARY: Glucose-Capillary: 155 mg/dL — ABNORMAL HIGH (ref 70–99)

## 2014-01-16 LAB — I-STAT TROPONIN, ED: Troponin i, poc: 0 ng/mL (ref 0.00–0.08)

## 2014-01-16 MED ORDER — LISINOPRIL 10 MG PO TABS
10.0000 mg | ORAL_TABLET | Freq: Every day | ORAL | Status: DC
  Administered 2014-01-17: 10 mg via ORAL
  Filled 2014-01-16: qty 1

## 2014-01-16 MED ORDER — ASPIRIN 81 MG PO CHEW: 324.0000 mg | CHEWABLE_TABLET | Freq: Once | ORAL | Status: DC

## 2014-01-16 MED ORDER — LORATADINE 10 MG PO TABS
10.0000 mg | ORAL_TABLET | Freq: Every day | ORAL | Status: DC
  Administered 2014-01-17: 10 mg via ORAL
  Filled 2014-01-16: qty 1

## 2014-01-16 MED ORDER — DICYCLOMINE HCL 10 MG PO CAPS
10.0000 mg | ORAL_CAPSULE | Freq: Three times a day (TID) | ORAL | Status: DC
  Administered 2014-01-16 – 2014-01-17 (×4): 10 mg via ORAL
  Filled 2014-01-16 (×6): qty 1

## 2014-01-16 MED ORDER — SODIUM CHLORIDE 0.9 % IJ SOLN: 3.0000 mL | INTRAMUSCULAR | Status: DC | PRN

## 2014-01-16 MED ORDER — ATORVASTATIN CALCIUM 80 MG PO TABS
80.0000 mg | ORAL_TABLET | Freq: Every day | ORAL | Status: DC
  Administered 2014-01-16 – 2014-01-17 (×2): 80 mg via ORAL
  Filled 2014-01-16 (×2): qty 1

## 2014-01-16 MED ORDER — ONDANSETRON HCL 4 MG/2ML IJ SOLN: 4.0000 mg | Freq: Four times a day (QID) | INTRAMUSCULAR | Status: DC | PRN

## 2014-01-16 MED ORDER — SODIUM CHLORIDE 0.9 % IV SOLN
INTRAVENOUS | Status: DC
  Administered 2014-01-16: 22:00:00 via INTRAVENOUS

## 2014-01-16 MED ORDER — NITROGLYCERIN 2 % TD OINT
1.0000 [in_us] | TOPICAL_OINTMENT | Freq: Four times a day (QID) | TRANSDERMAL | Status: DC
  Administered 2014-01-16 – 2014-01-18 (×7): 1 [in_us] via TOPICAL
  Filled 2014-01-16: qty 1
  Filled 2014-01-16: qty 30
  Filled 2014-01-16: qty 1

## 2014-01-16 MED ORDER — ASPIRIN 81 MG PO CHEW
324.0000 mg | CHEWABLE_TABLET | ORAL | Status: AC
  Administered 2014-01-17: 324 mg via ORAL
  Filled 2014-01-16: qty 4

## 2014-01-16 MED ORDER — ALPRAZOLAM 0.25 MG PO TABS
0.1250 mg | ORAL_TABLET | Freq: Every evening | ORAL | Status: DC | PRN
  Administered 2014-01-16: 0.5 mg via ORAL
  Filled 2014-01-16: qty 2

## 2014-01-16 MED ORDER — SODIUM CHLORIDE 0.9 % IV SOLN: 250.0000 mL | INTRAVENOUS | Status: DC | PRN

## 2014-01-16 MED ORDER — ACETAMINOPHEN 325 MG PO TABS: 650.0000 mg | ORAL_TABLET | ORAL | Status: DC | PRN

## 2014-01-16 MED ORDER — SERTRALINE HCL 50 MG PO TABS
50.0000 mg | ORAL_TABLET | Freq: Two times a day (BID) | ORAL | Status: DC
  Administered 2014-01-16 – 2014-01-17 (×2): 50 mg via ORAL
  Filled 2014-01-16 (×3): qty 1

## 2014-01-16 MED ORDER — ASPIRIN 325 MG PO TABS
325.0000 mg | ORAL_TABLET | Freq: Every day | ORAL | Status: DC
  Filled 2014-01-16: qty 1

## 2014-01-16 MED ORDER — SODIUM CHLORIDE 0.9 % IJ SOLN
3.0000 mL | Freq: Two times a day (BID) | INTRAMUSCULAR | Status: DC
  Administered 2014-01-16: 3 mL via INTRAVENOUS

## 2014-01-16 MED ORDER — NITROGLYCERIN 0.4 MG SL SUBL: 0.4000 mg | SUBLINGUAL_TABLET | SUBLINGUAL | Status: DC | PRN

## 2014-01-16 NOTE — ED Notes (Signed)
Pt returned from CT. Family at bedside

## 2014-01-16 NOTE — ED Provider Notes (Signed)
5:19 PM  Assumed care from Dr. Betsey Holiday.  Pt is a 63 y.o. M with significant history of coronary artery disease status post CABG who is currently being treated with medical management who presents emergency department with chest pain, nausea. First troponin negative. Chest x-ray clear.  Disposition per cardiology who will see the patient in the ED.  7:00 PM  Pt admitted by cardiology for UA.  Stable in ED.    [place], DO 01/16/14 1928

## 2014-01-16 NOTE — ED Provider Notes (Signed)
CSN: [id number]     Arrival date & time 01/16/14  1356 History   First MD Initiated Contact with Patient 01/16/14 1403     Chief Complaint  Patient presents with  . Fatigue  . Chest Pain     (Consider location/radiation/quality/duration/timing/severity/associated sxs/prior Treatment) HPI Comments: Patient presents to the ER for evaluation of chest pain. Patient reports that for the last 2 or 3 weeks he has been having intermittent episodes of substernal chest pain with radiation to the left arm. This has occurred both at rest and with exertion. Patient reports generalized fatigue over this period of time. He is not short of breath. Patient reports that he went to his doctor's office this morning to be seen by the doctor was not in, was referred to the ER. He was not in any pain at time of arrival to the ER.  Patient is a 63 y.o. male presenting with chest pain.  Chest Pain   Past Medical History  Diagnosis Date  . Coronary artery disease     CABG 2/01, cath 4/13 - med Rx  . Hypertension     type 2 NIDDM  . Diabetes mellitus   . Arthritis   . GERD (gastroesophageal reflux disease)   . High cholesterol   . Myocardial infarct     x4 last 10 yrs  . Anxiety     off xanax  and paxil since 3/13  . COPD (chronic obstructive pulmonary disease)   . Pneumonia     hx  . Cancer     tumor basal cell rem from lft arm  . Smoker   . Anxiety   . 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 Depression   . Substance abuse   . Cataract   . Heart attack    Past Surgical History  Procedure Laterality Date  . Coronary stent placement    . Femoral artery - femoral artery bypass graft Right 2004    femoral enarterectomy  . US extremity*l*      lft arm tumor removed   . Cardiac catheterization  4/12 and 4/13    Medical Rx  . Coronary angioplasty  Jan 2004    RCA  . Back surgery  Jan 2014    Dr Vertell Limber  . Coronary artery bypass graft  07/24/2009    L-LAD,  SVG-RI/OM, SVG-PDA  . Eye surgery      cat bil   Family History  Problem Relation Age of Onset  . Arthritis Mother   . Heart disease Father    History  Substance Use Topics  . Smoking status: Current Every Day Smoker -- 1.00 packs/day for 40 years    Types: Cigarettes  . Smokeless tobacco: Never Used  . Alcohol Use: Yes     Comment: socially    Review of Systems  Cardiovascular: Positive for chest pain.  All other systems reviewed and are negative.     Allergies  Coreg and Sulfa antibiotics  Home Medications   Prior to Admission medications   Medication Sig Start Date End Date Taking? Authorizing Provider  ALPRAZolam Duanne Moron) 0.5 MG tablet Take 0.125-0.5 mg by mouth at bedtime as needed for anxiety (for panic attacks).   Yes Historical Provider, MD  aspirin 325 MG tablet Take 325 mg by mouth daily.   Yes Historical Provider, MD  dicyclomine (BENTYL) 10 MG capsule Take 1 capsule (10 mg total) by mouth 4 (four) times daily -  before meals  and at bedtime. 11/18/13  Yes Posey Boyer, MD  isosorbide mononitrate (IMDUR) 30 MG 24 hr tablet Take 30 mg by mouth daily.   Yes Historical Provider, MD  lisinopril (PRINIVIL,ZESTRIL) 10 MG tablet Take 1 tablet (10 mg total) by mouth daily. 11/18/13  Yes Posey Boyer, MD  loratadine (CLARITIN) 10 MG tablet Take 10 mg by mouth daily.   Yes Historical Provider, MD  lovastatin (MEVACOR) 20 MG tablet Take 20 mg by mouth daily.   Yes Historical Provider, MD  metFORMIN (GLUCOPHAGE) 500 MG tablet Take 500-1,000 mg by mouth 2 (two) times daily with a meal. Take 1 or 2 tablets depending on blood sugar level   Yes Historical Provider, MD  naproxen sodium (ANAPROX) 220 MG tablet Take 440 mg by mouth 2 (two) times daily as needed (for pain).   Yes Historical Provider, MD  ranitidine (ZANTAC) 150 MG tablet Take 150 mg by mouth daily as needed for heartburn. For acid reflux   Yes Historical Provider, MD  sertraline (ZOLOFT) 50 MG tablet Take 50 mg by  mouth 2 (two) times daily.   Yes Historical Provider, MD  isosorbide mononitrate (IMDUR) 30 MG 24 hr tablet TAKE 1 TABLET BY MOUTH EVERY DAY    [name], MD   BP 123/65  Pulse 73  Temp(Src) 98.1 F (36.7 C) (Oral)  Resp 18  Ht 5\' 9"  (1.753 m)  Wt 168 lb 3.2 oz (76.295 kg)  BMI 24.83 kg/m2  SpO2 96% Physical Exam  Constitutional: He is oriented to person, place, and time. He appears well-developed and well-nourished. No distress.  HENT:  Head: Normocephalic and atraumatic.  Right Ear: Hearing normal.  Left Ear: Hearing normal.  Nose: Nose normal.  Mouth/Throat: Oropharynx is clear and moist and mucous membranes are normal.  Eyes: Conjunctivae and EOM are normal. Pupils are equal, round, and reactive to light.  Neck: Normal range of motion. Neck supple.  Cardiovascular: Regular rhythm, S1 normal and S2 normal.  Exam reveals no gallop and no friction rub.   No murmur heard. Pulmonary/Chest: Effort normal and breath sounds normal. No respiratory distress. He exhibits no tenderness.  Abdominal: Soft. Normal appearance and bowel sounds are normal. There is no hepatosplenomegaly. There is no tenderness. There is no rebound, no guarding, no tenderness at McBurney's point and negative Murphy's sign. No hernia.  Musculoskeletal: Normal range of motion.  Neurological: He is alert and oriented to person, place, and time. He has normal strength. No cranial nerve deficit or sensory deficit. Coordination normal. GCS eye subscore is 4. GCS verbal subscore is 5. GCS motor subscore is 6.  Skin: Skin is warm, dry and intact. No rash noted. No cyanosis.  Psychiatric: He has a normal mood and affect. His speech is normal and behavior is normal. Thought content normal.    ED Course  Procedures (including critical care time) Labs Review Labs Reviewed  CBC - Abnormal; Notable for the following:    WBC 14.0 (*)    All other components within normal limits  BASIC METABOLIC PANEL - Abnormal; Notable  for the following:    Sodium 128 (*)    Chloride 91 (*)    Glucose, Bld 143 (*)    GFR calc non Af Amer 71 (*)    GFR calc Af Amer 83 (*)    Anion gap 18 (*)    All other components within normal limits  LIPID PANEL - Abnormal; Notable for the following:    Triglycerides 245 (*)  HDL 36 (*)    VLDL 49 (*)    All other components within normal limits  CBC - Abnormal; Notable for the following:    WBC 10.8 (*)    All other components within normal limits  BASIC METABOLIC PANEL - Abnormal; Notable for the following:    Sodium 134 (*)    Glucose, Bld 139 (*)    GFR calc non Af Amer 77 (*)    GFR calc Af Amer 90 (*)    All other components within normal limits  GLUCOSE, CAPILLARY - Abnormal; Notable for the following:    Glucose-Capillary 155 (*)    All other components within normal limits  GLUCOSE, CAPILLARY - Abnormal; Notable for the following:    Glucose-Capillary 108 (*)    All other components within normal limits  PROTIME-INR  TROPONIN I  I-STAT TROPOININ, ED    Imaging Review Dg Chest 2 View  01/16/2014   CLINICAL DATA:  Fatigue with chest pain for 2 months. Occasional left arm pain and numbness. Current smoker.  EXAM: CHEST  2 VIEW  COMPARISON:  Radiographs 07/09/2012 and 07/07/2013.  FINDINGS: The heart size and mediastinal contours are stable status post CABG. Coronary artery stent is noted. The lungs are clear. There is no pleural effusion or pneumothorax. The osseous structures appear normal.  IMPRESSION: Stable postoperative chest.  No acute cardiopulmonary process.   Electronically Signed   By: Camie Patience M.D.   On: 01/16/2014 16:21     EKG Interpretation   Date/Time:  Monday January 16 2014 13:58:31 EDT Ventricular Rate:  80 PR Interval:  130 QRS Duration: 140 QT Interval:  400 QTC Calculation: 461 R Axis:   131 Text Interpretation:  Normal sinus rhythm Right bundle branch block Left  posterior fascicular block Bifascicular block Possible Inferior   infarct , age undetermined Abnormal ECG No significant change since last  tracing Confirmed by Mecca Barga  MD, Harrell Gave [phone]) on 01/16/2014 2:27:35  PM      MDM   Final diagnoses:  Unstable angina  Coronary artery disease involving native coronary artery of native heart with unstable angina pectoris  Tobacco abuse   Presents to the ER for evaluation of chest pain. Patient has a known history of severe three-vessel disease, status post coronary artery bypass grafting. The patient had cardiac catheterization in January of this year, was recommended medical management after the blockages were noted. Patient is currently taking Imdur, does not have any Nitrostat.  Patient does have a history of anxiety disorder, uses Xanax for the anxiety. History that he gives me today, however, is concerning for anginal type pain that has been occurring intermittently over the last couple of weeks.  Based on this, cardiology was consulted to evaluate the patient for treatment and disposition.     Orpah Greek, MD 01/17/14 1102

## 2014-01-16 NOTE — ED Notes (Signed)
Pt up to bathroom without any problems 

## 2014-01-16 NOTE — ED Notes (Signed)
Pt to xray at this time.

## 2014-01-16 NOTE — ED Notes (Signed)
Pt up to bathroom at this time

## 2014-01-16 NOTE — ED Notes (Signed)
Pt states "ive been so tired the past few weeks. My chest has been hurting into my left shoulder and my left arm. It hurts to move. Ive had nausea and vomited once also"

## 2014-01-16 NOTE — ED Notes (Signed)
Pt in x-ray at this time

## 2014-01-16 NOTE — ED Notes (Signed)
Pt remains in x-ray at this time.

## 2014-01-16 NOTE — H&P (Signed)
Patient ID: Jason [name] MRN: [id number] DOB/AGE: May 17, 1951 63 y.o. Admit date: 01/16/2014  Primary Cardiologist: [place]  HPI: 63 yo male with history of CAD s/p CABG, HTN, DM, OA, GERD, HLD, anxiety, depression, COPD, ongoing tobacco abuse presenting to ED with c/o exertional chest pain for 2 weeks. He had CABG in 2001. Last cath during admission January 2015 with chest pain. Cath January 2015 with 3/4 patent bypass grafts. The chronically occluded RCA fills from left to right collaterals. Patent LIMA to LAD and patent SVG to OM1/OM2. Occluded SVG to PDA/PLA. He notes chest pressure with minimal exertion. Associated SOB. Radiation into left arm. Still smoking 1ppd. First troponin negative in ED. EKG without ischemic changes.   Review of systems complete and found to be negative unless listed above   Past Medical History  Diagnosis Date  . Coronary artery disease     CABG 2/01, cath 4/13 - med Rx  . Hypertension     type 2 NIDDM  . Diabetes mellitus   . Arthritis   . GERD (gastroesophageal reflux disease)   . High cholesterol   . Myocardial infarct     x4 last 10 yrs  . Anxiety     off xanax  and paxil since 3/13  . COPD (chronic obstructive pulmonary disease)   . Pneumonia     hx  . Cancer     tumor basal cell rem from lft arm  . Smoker   . Anxiety   . 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 Depression   . Substance abuse   . Cataract   . Heart attack     Family History  Problem Relation Age of Onset  . Arthritis Mother   . Heart disease Father     History   Social History  . Marital Status: Divorced    Spouse Name: N/A    Number of Children: N/A  . Years of Education: N/A   Occupational History  . welder-fabricator    Social History Main Topics  . Smoking status: Current Every Day Smoker -- 1.00 packs/day for 40 years    Types: Cigarettes  . Smokeless tobacco: Never Used  . Alcohol Use: Yes     Comment:  socially  . Drug Use: No  . Sexual Activity: Not Currently   Other Topics Concern  . Not on file   Social History Narrative   Exercise walking 4 times per week for 1 mile    Past Surgical History  Procedure Laterality Date  . Coronary stent placement    . Femoral artery - femoral artery bypass graft Right 2004    femoral enarterectomy  . US extremity*l*      lft arm tumor removed   . Cardiac catheterization  4/12 and 4/13    Medical Rx  . Coronary angioplasty  Jan 2004    RCA  . Back surgery  Jan 2014    Dr Vertell Limber  . Coronary artery bypass graft  07/24/2009    L-LAD, SVG-RI/OM, SVG-PDA  . Eye surgery      cat bil    Allergies  Allergen Reactions  . Coreg [Carvedilol] Nausea And Vomiting and Other (See Comments)    Per patient made him dizzy and light sensitive  . Sulfa Antibiotics Nausea And Vomiting and Other (See Comments)    Also headaches     Prior to Admission Meds:  Current Outpatient Prescriptions on File Prior to Encounter  Medication Sig Dispense Refill  . aspirin 325 MG tablet Take 325 mg by mouth daily.      Marland Kitchen dicyclomine (BENTYL) 10 MG capsule Take 1 capsule (10 mg total) by mouth 4 (four) times daily -  before meals and at bedtime.  40 capsule  1  . isosorbide mononitrate (IMDUR) 30 MG 24 hr tablet Take 30 mg by mouth daily.      Marland Kitchen lisinopril (PRINIVIL,ZESTRIL) 10 MG tablet Take 1 tablet (10 mg total) by mouth daily.  30 tablet  5  . loratadine (CLARITIN) 10 MG tablet Take 10 mg by mouth daily.      . ranitidine (ZANTAC) 150 MG tablet Take 150 mg by mouth daily as needed for heartburn. For acid reflux        Physical Exam: Blood pressure 116/63, pulse 69, temperature 97.7 F (36.5 C), temperature source Oral, resp. rate 10, SpO2 97.00%. General: Well developed, well nourished, NAD  HEENT: OP clear, mucus membranes moist  SKIN: warm, dry. No rashes.  Neuro: No focal deficits  Musculoskeletal: Muscle strength 5/5 all ext  Psychiatric: Mood and affect  normal  Neck: No JVD, no carotid bruits, no thyromegaly, no lymphadenopathy.  Lungs:Clear bilaterally, no wheezes, rhonci, crackles  Cardiovascular: Regular rate and rhythm. No murmurs, gallops or rubs.  Abdomen:Soft. Bowel sounds present. Non-tender.  Extremities: No lower extremity edema. Pulses are 2 + in the bilateral DP/PT.   Labs:   Lab Results  Component Value Date   WBC 14.0* 01/16/2014   HGB 15.5 01/16/2014   HCT 44.8 01/16/2014   MCV 94.3 01/16/2014   PLT 223 01/16/2014    Recent Labs Lab 01/16/14 1415  NA 128*  K 4.2  CL 91*  CO2 19  BUN 10  CREATININE 1.09  CALCIUM 9.4  GLUCOSE 143*   Lab Results  Component Value Date   CKTOTAL 51 10/03/2011   CKMB 2.4 10/03/2011   TROPONINI <0.30 07/08/2013     Cardiac cath 07/08/13: Left mainstem: 70% ostial and distal LM stenosis  Left anterior descending (LAD): moderately calcified. 80-90% stenosis in the mid-vessel with competitive filling from the LIMA graft distally.  Left circumflex (LCx): AV circ is small but patent. The OM branches are occluded and fill from the SVG-OM1 and OM2  Right coronary artery (RCA): Extensively stented. Total occlusion after the conus branch. Distal branches fill from left-right collaterals  LIMA-LAD: widely patent  SVG-OM1 and OM 2: widely patent throughout  SVG-PDA/PLA: known to be occluded  Left ventriculography: Basal inferior akinesis, no significant MR, LVEF estimated at 55%  Chest x-ray: The heart size and mediastinal contours are stable status post CABG.  Coronary artery stent is noted. The lungs are clear. There is no  pleural effusion or pneumothorax. The osseous structures appear  normal.  IMPRESSION:  Stable postoperative chest. No acute cardiopulmonary process.  EKG: Sinus, RBBB, old inferior MI. Rate 80 bpm. LPFB.   ASSESSMENT AND PLAN:   1. Unstable angina/CAD: He is presenting with symptoms c/w class III unstable angina. Will cycle cardiac markers tonight. Continue ASA,  lisinopril. He is intolerant of beta blockers. Currently has responded well to NTG paste. Will continue tonight. No IV heparin unless he rules in for MI with elevated troponin. Resume statin. Given his presentation with recent symptoms suggestive of unstable angina, will make NPO at midnight and plan cardiac cath with possible PCI tomorrow. He understands risks and benefits.   2. HTN: BP currently well controlled.   3. Tobacco abuse:  Smoking cessation encouraged.   Darlina Guys, MD 01/16/2014, 6:29 PM

## 2014-01-16 NOTE — ED Notes (Signed)
Cardiologist in to assess pt at this time.   

## 2014-01-17 ENCOUNTER — Encounter (HOSPITAL_COMMUNITY): Payer: Self-pay | Admitting: General Practice

## 2014-01-17 ENCOUNTER — Encounter (HOSPITAL_COMMUNITY)
Admission: EM | Disposition: A | Discharge status: Home / Self Care | Payer: Self-pay | Source: Home / Self Care | Attending: Cardiovascular Disease

## 2014-01-17 HISTORY — PX: CORONARY ANGIOGRAM: SHX5786

## 2014-01-17 HISTORY — PX: LEFT HEART CATHETERIZATION WITH CORONARY/GRAFT ANGIOGRAM: SHX5450

## 2014-01-17 LAB — CBC
HCT: 43 % (ref 39.0–52.0)
Hemoglobin: 14.7 g/dL (ref 13.0–17.0)
MCH: 31.7 pg (ref 26.0–34.0)
MCHC: 34.2 g/dL (ref 30.0–36.0)
MCV: 92.7 fL (ref 78.0–100.0)
PLATELETS: 208 10*3/uL (ref 150–400)
RBC: 4.64 MIL/uL (ref 4.22–5.81)
RDW: 13.8 % (ref 11.5–15.5)
WBC: 10.8 10*3/uL — AB (ref 4.0–10.5)

## 2014-01-17 LAB — PROTIME-INR
INR: 1.05 (ref 0.00–1.49)
PROTHROMBIN TIME: 13.7 s (ref 11.6–15.2)

## 2014-01-17 LAB — LIPID PANEL
CHOL/HDL RATIO: 4.9 ratio
CHOLESTEROL: 178 mg/dL (ref 0–200)
HDL: 36 mg/dL — ABNORMAL LOW (ref >39)
LDL Cholesterol: 93 mg/dL (ref 0–99)
Triglycerides: 245 mg/dL — ABNORMAL HIGH (ref <150)
VLDL: 49 mg/dL — AB (ref 0–40)

## 2014-01-17 LAB — GLUCOSE, CAPILLARY
GLUCOSE-CAPILLARY: 138 mg/dL — AB (ref 70–99)
Glucose-Capillary: 108 mg/dL — ABNORMAL HIGH (ref 70–99)
Glucose-Capillary: 140 mg/dL — ABNORMAL HIGH (ref 70–99)
Glucose-Capillary: 117 mg/dL — ABNORMAL HIGH (ref 70–99)

## 2014-01-17 LAB — BASIC METABOLIC PANEL
ANION GAP: 13 (ref 5–15)
BUN: 10 mg/dL (ref 6–23)
CO2: 23 mEq/L (ref 19–32)
Calcium: 9.4 mg/dL (ref 8.4–10.5)
Chloride: 98 mEq/L (ref 96–112)
Creatinine, Ser: 1.02 mg/dL (ref 0.50–1.35)
GFR calc Af Amer: 90 mL/min — ABNORMAL LOW (ref >90)
GFR calc non Af Amer: 77 mL/min — ABNORMAL LOW (ref >90)
Glucose, Bld: 139 mg/dL — ABNORMAL HIGH (ref 70–99)
Potassium: 4.6 mEq/L (ref 3.7–5.3)
SODIUM: 134 meq/L — AB (ref 137–147)

## 2014-01-17 LAB — TROPONIN I

## 2014-01-17 SURGERY — LEFT HEART CATHETERIZATION WITH CORONARY/GRAFT ANGIOGRAM
Anesthesia: LOCAL

## 2014-01-17 MED ORDER — ACETAMINOPHEN 325 MG PO TABS
650.0000 mg | ORAL_TABLET | ORAL | Status: DC | PRN
Start: 2014-01-17 — End: 2014-01-18

## 2014-01-17 MED ORDER — FENTANYL CITRATE 0.05 MG/ML IJ SOLN
INTRAMUSCULAR | Status: AC
  Filled 2014-01-17: qty 2

## 2014-01-17 MED ORDER — MIDAZOLAM HCL 2 MG/2ML IJ SOLN
INTRAMUSCULAR | Status: AC
  Filled 2014-01-17: qty 2

## 2014-01-17 MED ORDER — RANOLAZINE ER 500 MG PO TB12
500.0000 mg | ORAL_TABLET | Freq: Two times a day (BID) | ORAL | Status: DC
  Administered 2014-01-17: 500 mg via ORAL
  Filled 2014-01-17 (×3): qty 1

## 2014-01-17 MED ORDER — ISOSORBIDE MONONITRATE ER 30 MG PO TB24
30.0000 mg | ORAL_TABLET | Freq: Every day | ORAL | Status: DC
  Filled 2014-01-17: qty 1

## 2014-01-17 MED ORDER — SODIUM CHLORIDE 0.9 % IV SOLN
INTRAVENOUS | Status: DC
  Administered 2014-01-17: 20:00:00 via INTRAVENOUS

## 2014-01-17 MED ORDER — NITROGLYCERIN 1 MG/10 ML FOR IR/CATH LAB
INTRA_ARTERIAL | Status: AC
  Filled 2014-01-17: qty 10

## 2014-01-17 MED ORDER — ALPRAZOLAM 0.5 MG PO TABS
0.5000 mg | ORAL_TABLET | Freq: Once | ORAL | Status: AC
  Administered 2014-01-17: 0.5 mg via ORAL
  Filled 2014-01-17: qty 1

## 2014-01-17 MED ORDER — HEPARIN (PORCINE) IN NACL 2-0.9 UNIT/ML-% IJ SOLN
INTRAMUSCULAR | Status: AC
  Filled 2014-01-17: qty 1000

## 2014-01-17 MED ORDER — ONDANSETRON HCL 4 MG/2ML IJ SOLN: 4.0000 mg | Freq: Four times a day (QID) | INTRAMUSCULAR | Status: DC | PRN

## 2014-01-17 MED ORDER — LIDOCAINE HCL (PF) 1 % IJ SOLN
INTRAMUSCULAR | Status: AC
  Filled 2014-01-17: qty 30

## 2014-01-17 NOTE — Interval H&P Note (Signed)
Cath Lab Visit (complete for each Cath Lab visit)  Clinical Evaluation Leading to the Procedure:   ACS: No.  Non-ACS:    Anginal Classification: CCS III  Anti-ischemic medical therapy: Maximal Therapy (2 or more classes of medications)  Non-Invasive Test Results: No non-invasive testing performed  Prior CABG: Previous CABG      History and Physical Interval Note:  01/17/2014 6:08 PM  Jason [name]  has presented today for surgery, with the diagnosis of cp  The various methods of treatment have been discussed with the patient and family. After consideration of risks, benefits and other options for treatment, the patient has consented to  Procedure(s): LEFT HEART CATHETERIZATION WITH CORONARY/GRAFT ANGIOGRAM (N/A) as a surgical intervention .  The patient's history has been reviewed, patient examined, no change in status, stable for surgery.  I have reviewed the patient's chart and labs.  Questions were answered to the patient's satisfaction.     KELLY,THOMAS A

## 2014-01-17 NOTE — CV Procedure (Signed)
Jason [name] is a 63 y.o. male    [id number]  [id number] LOCATION:  FACILITY: [facility]  PHYSICIAN: Troy Sine, MD, [facility] Feb 13, 1951   DATE OF PROCEDURE:  01/17/2014    CARDIAC CATHETERIZATION     HISTORY:    Jason [name] is a 63 y.o. male who has a history of coronary artery disease, and underwent CABG revascularization surgery in 2001.  He has a history of hypertension, diabetes mellitus, COPD, ongoing tobacco use.  He had undergone cardiac catheterization in January 2015 at which time he had pain LIMA to the LAD, and a sequential pain.  Main graft to 2 marginal vessels.  He had an occluded sequential graft, which supplied the PDA and PLA vessel in his RCA was chronically occluded and filled from some left to right collaterals.  He was admitted to [facility] last evening with increasing episodes of exertional chest pain for 2 weeks duration.  He now presents for repeat cardiac catheterization   PROCEDURE:  Catheterization: Coronary angiography, selective angiography into the saphenous vein grafts, selective angiography into the left internal mammary artery, left ventriculography  The patient was brought to the second floor cath lab in the postabsorptive state. He  was premedicated with Versed 2 mg and fentanyl 50 mcg intravenously. His  right groin was prepped and shaved in usual sterile fashion. Xylocaine 1% was used for local anesthesia. A 5 French sheath was inserted into the left femoral artery. Diagnostic catheterizatiion was done with 5 [place] LF4, FR4, and pigtail catheters. Left ventriculography was done with 25 cc Omnipaque contrast. Hemostasis was obtained by direct manual compression. The patient tolerated the procedure well.   HEMODYNAMICS:   Central Aorta: 110/50   Left Ventricle: 110/6  ANGIOGRAPHY:  Left main: 70% ostial and distal left main stenosis.  The left main appeared to trifurcate into the LAD, a "nubbin" of an intermediate vessel, and the left  circumflex coronary artery.  LAD: There was evidence for coronary calcification.  There was 50% diffuse stenosis in the first diagonal vessel.  There was 80% moderately calcified stenosis in the mid vessel with filling from the LIMA graft distally  Ramus Intermediate: Occluded at its origin.  Left circumflex: 80% mid stenosis.  Small AV groove circumflex.  Marginals were not visualized.  Antegrade.  Right coronary artery: Total proximal occlusion prior to the previously stented segment.  Mild right to right collaterals, as well as mild to moderate left to right collaterals supplying the distal RCA.  LIMA to LAD: Widely patent anastomosis into the mid LAD  SVG to OM1 and OM 2:  widely patent.  There is mild 30% narrowing at the L1 like vessel just beyond the anastomosis.  Sequential SVG to PDA/PLA: Occluded, no change from prior study.   Left ventriculography revealed an ejection fraction of approximately 50%.  There was moderate mid to basal inferior hypokinesis.    IMPRESSION:  Mild LV dysfunction with mid to basal, inferior hypocontractility and ejection fraction of 50%.  Significant native coronary obstructive disease with evidence for coronary calcification and 70% ostial and distal left main stenoses, 50% stenosis in the first diagonal branch of the LAD, 80% mid LAD stenosis; occluded high marginal/ramus immediately vessel, 80% AV groove left circumflex stenosis, a total proximal RCA occlusion.  Prior to extensively stented RCA with evidence for mild right to right and moderate left to right collaterals.  Patent LIMA graft to the LAD.  Patent sequential vein graft supplying a high marginal in OM 2  vessel.  Occluded SVG, which had previously sequentially supplied the PDA and PLA branches of the RCA, which is old.  RECOMMENDATION:  Increase medical therapy.  The patient will be started on ranolazine 500 mg twice a day with plans to titrate to 1000 mg  twice a day in light of his  native RCA and graft occlusion and ongoing anginal symptomatology.  Nitrate therapy will also be instituted in addition to beta blocker therapy ACE-I  and aggressive statin therapy.  The patient must discontinue tobacco use.  Troy Sine, MD, [facility] 01/17/2014 7:05 PM

## 2014-01-17 NOTE — Progress Notes (Signed)
     SUBJECTIVE: No chest pain overnight.  BP 123/65  Pulse 73  Temp(Src) 98.1 F (36.7 C) (Oral)  Resp 18  Ht 5\' 9"  (1.753 m)  Wt 168 lb 3.2 oz (76.295 kg)  BMI 24.83 kg/m2  SpO2 96%  Intake/Output Summary (Last 24 hours) at 01/17/14 4259 Last data filed at 01/17/14 0518  Gross per 24 hour  Intake      3 ml  Output   1200 ml  Net  -1197 ml    PHYSICAL EXAM General: Well developed, well nourished, in no acute distress. Alert and oriented x 3.  Psych:  Good affect, responds appropriately Neck: No JVD. No masses noted.  Lungs: Clear bilaterally with no wheezes or rhonci noted.  Heart: RRR with no murmurs noted. Abdomen: Bowel sounds are present. Soft, non-tender.  Extremities: No lower extremity edema.   LABS: Basic Metabolic Panel:  Recent Labs  01/16/14 1415 01/17/14 0354  NA 128* 134*  K 4.2 4.6  CL 91* 98  CO2 19 23  GLUCOSE 143* 139*  BUN 10 10  CREATININE 1.09 1.02  CALCIUM 9.4 9.4   CBC:  Recent Labs  01/16/14 1415 01/17/14 0354  WBC 14.0* 10.8*  HGB 15.5 14.7  HCT 44.8 43.0  MCV 94.3 92.7  PLT 223 208   Fasting Lipid Panel:  Recent Labs  01/17/14 0355  CHOL 178  HDL 36*  LDLCALC 93  TRIG 245*  CHOLHDL 4.9    Current Meds: . aspirin  325 mg Oral Daily  . atorvastatin  80 mg Oral q1800  . dicyclomine  10 mg Oral TID AC & HS  . lisinopril  10 mg Oral Daily  . loratadine  10 mg Oral Daily  . nitroGLYCERIN  1 inch Topical 4 times per day  . sertraline  50 mg Oral BID  . sodium chloride  3 mL Intravenous Q12H   ASSESSMENT AND PLAN:  1. Unstable angina/CAD: He is presenting with symptoms c/w class III unstable angina. Troponin was not checked last night. Will check now. Continue ASA, lisinopril, statin. He is intolerant of beta blockers.  Plan for cardiac cath today with possible PCI. He understands risks and benefits.   2. HTN: BP currently well controlled.   3. Tobacco abuse: Smoking cessation encouraged.      Naylin Burkle  7/28/20159:58 AM

## 2014-01-17 NOTE — H&P (View-Only) (Signed)
     SUBJECTIVE: No chest pain overnight.  BP 123/65  Pulse 73  Temp(Src) 98.1 F (36.7 C) (Oral)  Resp 18  Ht 5\' 9"  (1.753 m)  Wt 168 lb 3.2 oz (76.295 kg)  BMI 24.83 kg/m2  SpO2 96%  Intake/Output Summary (Last 24 hours) at 01/17/14 1638 Last data filed at 01/17/14 0518  Gross per 24 hour  Intake      3 ml  Output   1200 ml  Net  -1197 ml    PHYSICAL EXAM General: Well developed, well nourished, in no acute distress. Alert and oriented x 3.  Psych:  Good affect, responds appropriately Neck: No JVD. No masses noted.  Lungs: Clear bilaterally with no wheezes or rhonci noted.  Heart: RRR with no murmurs noted. Abdomen: Bowel sounds are present. Soft, non-tender.  Extremities: No lower extremity edema.   LABS: Basic Metabolic Panel:  Recent Labs  01/16/14 1415 01/17/14 0354  NA 128* 134*  K 4.2 4.6  CL 91* 98  CO2 19 23  GLUCOSE 143* 139*  BUN 10 10  CREATININE 1.09 1.02  CALCIUM 9.4 9.4   CBC:  Recent Labs  01/16/14 1415 01/17/14 0354  WBC 14.0* 10.8*  HGB 15.5 14.7  HCT 44.8 43.0  MCV 94.3 92.7  PLT 223 208   Fasting Lipid Panel:  Recent Labs  01/17/14 0355  CHOL 178  HDL 36*  LDLCALC 93  TRIG 245*  CHOLHDL 4.9    Current Meds: . aspirin  325 mg Oral Daily  . atorvastatin  80 mg Oral q1800  . dicyclomine  10 mg Oral TID AC & HS  . lisinopril  10 mg Oral Daily  . loratadine  10 mg Oral Daily  . nitroGLYCERIN  1 inch Topical 4 times per day  . sertraline  50 mg Oral BID  . sodium chloride  3 mL Intravenous Q12H   ASSESSMENT AND PLAN:  1. Unstable angina/CAD: He is presenting with symptoms c/w class III unstable angina. Troponin was not checked last night. Will check now. Continue ASA, lisinopril, statin. He is intolerant of beta blockers.  Plan for cardiac cath today with possible PCI. He understands risks and benefits.   2. HTN: BP currently well controlled.   3. Tobacco abuse: Smoking cessation encouraged.      Garl Speigner  7/28/20159:58 AM

## 2014-01-18 ENCOUNTER — Telehealth: Payer: Self-pay | Admitting: *Deleted

## 2014-01-18 ENCOUNTER — Encounter (HOSPITAL_COMMUNITY): Payer: Self-pay | Admitting: Cardiology

## 2014-01-18 DIAGNOSIS — R079 Chest pain, unspecified: Secondary | ICD-10-CM

## 2014-01-18 DIAGNOSIS — I10 Essential (primary) hypertension: Secondary | ICD-10-CM

## 2014-01-18 LAB — GLUCOSE, CAPILLARY: GLUCOSE-CAPILLARY: 115 mg/dL — AB (ref 70–99)

## 2014-01-18 MED ORDER — METFORMIN HCL 500 MG PO TABS: 500.0000 mg | ORAL_TABLET | Freq: Two times a day (BID) | ORAL | Status: DC

## 2014-01-18 MED ORDER — NITROGLYCERIN 0.4 MG SL SUBL: 0.4000 mg | SUBLINGUAL_TABLET | SUBLINGUAL | Status: DC | PRN

## 2014-01-18 MED ORDER — SERTRALINE HCL 50 MG PO TABS
50.0000 mg | ORAL_TABLET | Freq: Two times a day (BID) | ORAL | Status: DC
  Administered 2014-01-18: 50 mg via ORAL
  Filled 2014-01-18 (×2): qty 1

## 2014-01-18 MED ORDER — ACETAMINOPHEN 325 MG PO TABS: 650.0000 mg | ORAL_TABLET | ORAL | Status: DC | PRN

## 2014-01-18 MED ORDER — LISINOPRIL 10 MG PO TABS
10.0000 mg | ORAL_TABLET | Freq: Every day | ORAL | Status: DC
Start: 2014-01-18 — End: 2014-01-18
  Administered 2014-01-18: 10 mg via ORAL
  Filled 2014-01-18: qty 1

## 2014-01-18 MED ORDER — ATORVASTATIN CALCIUM 80 MG PO TABS
80.0000 mg | ORAL_TABLET | Freq: Every day | ORAL | Status: DC
  Filled 2014-01-18: qty 1

## 2014-01-18 MED ORDER — ASPIRIN 81 MG PO CHEW
81.0000 mg | CHEWABLE_TABLET | Freq: Every day | ORAL | Status: DC
  Administered 2014-01-18: 81 mg via ORAL
  Filled 2014-01-18: qty 1

## 2014-01-18 MED ORDER — ATORVASTATIN CALCIUM 80 MG PO TABS: 80.0000 mg | ORAL_TABLET | Freq: Every day | ORAL | Status: DC

## 2014-01-18 MED ORDER — DICYCLOMINE HCL 10 MG PO CAPS
10.0000 mg | ORAL_CAPSULE | Freq: Three times a day (TID) | ORAL | Status: DC
  Administered 2014-01-18: 10 mg via ORAL
  Filled 2014-01-18 (×4): qty 1

## 2014-01-18 MED ORDER — RANOLAZINE ER 500 MG PO TB12: 500.0000 mg | ORAL_TABLET | Freq: Two times a day (BID) | ORAL | Status: DC

## 2014-01-18 NOTE — Progress Notes (Signed)
     SUBJECTIVE: no chest pain or SOB  BP 119/60  Pulse 71  Temp(Src) 97.7 F (36.5 C) (Oral)  Resp 18  Ht 5\' 9"  (1.753 m)  Wt 167 lb 15.9 oz (76.2 kg)  BMI 24.80 kg/m2  SpO2 93% No intake or output data in the 24 hours ending 01/18/14 0815  PHYSICAL EXAM General: Well developed, well nourished, in no acute distress. Alert and oriented x 3.  Psych:  Good affect, responds appropriately Neck: No JVD. No masses noted.  Lungs: Clear bilaterally with no wheezes or rhonci noted.  Heart: RRR with no murmurs noted. Abdomen: Bowel sounds are present. Soft, non-tender.  Extremities: No lower extremity edema.   LABS: Basic Metabolic Panel:  Recent Labs  01/16/14 1415 01/17/14 0354  NA 128* 134*  K 4.2 4.6  CL 91* 98  CO2 19 23  GLUCOSE 143* 139*  BUN 10 10  CREATININE 1.09 1.02  CALCIUM 9.4 9.4   CBC:  Recent Labs  01/16/14 1415 01/17/14 0354  WBC 14.0* 10.8*  HGB 15.5 14.7  HCT 44.8 43.0  MCV 94.3 92.7  PLT 223 208   Cardiac Enzymes:  Recent Labs  01/17/14 1035  TROPONINI <0.30   Fasting Lipid Panel:  Recent Labs  01/17/14 0355  CHOL 178  HDL 36*  LDLCALC 93  TRIG 245*  CHOLHDL 4.9    Current Meds: . aspirin  81 mg Oral Daily  . atorvastatin  80 mg Oral q1800  . dicyclomine  10 mg Oral TID AC & HS  . isosorbide mononitrate  30 mg Oral Daily  . lisinopril  10 mg Oral Daily  . nitroGLYCERIN  1 inch Topical 4 times per day  . ranolazine  500 mg Oral BID  . sertraline  50 mg Oral BID   Cardiac cath 01/17/14: Left main: 70% ostial and distal left main stenosis. The left main appeared to trifurcate into the LAD, a "nubbin" of an intermediate vessel, and the left circumflex coronary artery.  LAD: There was evidence for coronary calcification. There was 50% diffuse stenosis in the first diagonal vessel. There was 80% moderately calcified stenosis in the mid vessel with filling from the LIMA graft distally  Ramus Intermediate: Occluded at its origin.    Left circumflex: 80% mid stenosis. Small AV groove circumflex. Marginals were not visualized. Antegrade.  Right coronary artery: Total proximal occlusion prior to the previously stented segment. Mild right to right collaterals, as well as mild to moderate left to right collaterals supplying the distal RCA.  LIMA to LAD: Widely patent anastomosis into the mid LAD  SVG to OM1 and OM 2: widely patent. There is mild 30% narrowing at the L1 like vessel just beyond the anastomosis.  Sequential SVG to PDA/PLA: Occluded, no change from prior study.  Left ventriculography revealed an ejection fraction of approximately 50%. There was moderate mid to basal inferior hypokinesis.  ASSESSMENT AND PLAN:   1. Unstable angina/CAD: Cardiac cath 01/17/14 per [name] with stable CAD. Continue ASA, lisinopril, statin, Imdur. Ranexa added by [name]. He is intolerant of beta blockers. He was started on Ranexa yesterday in the cath lab but he cannot afford this. Will d/c Ranexa.   2. HTN: BP currently well controlled.   3. Tobacco abuse: Smoking cessation encouraged.   Discharge home today. Follow up with Dr. Recardo Evangelist in 2-3 weeks.    Jason [name],Jason [name]  7/29/20158:15 AM

## 2014-01-18 NOTE — Discharge Summary (Signed)
Patient ID: Jason [name],  MRN: [id number], DOB/AGE: 61-Sep-1952 61 y.o.  Admit date: 01/16/2014 Discharge date: 01/18/2014  Primary Care Provider: Dr Perfecto Kingdom Primary Cardiologist: Dr Sallyanne Kuster  Discharge Diagnoses Principal Problem:   Unstable angina Active Problems:   DM (diabetes mellitus)   HTN (hypertension)   CAD, CABG Feb 2011, cath x 4 since-medical Rx   Hyperlipidemia   PVD, hx Rt femoral endarterectomy 2004   Tobacco abuse    Procedures: Coronary angiogram 01/17/14   Hospital Course: 63 yo male with history of CAD s/p CABG x 4 2011 x 5 , HTN, DM, OA, GERD, HLD, anxiety, depression, COPD, ongoing tobacco abuse presented to ED with c/o exertional chest pain for 2 weeks. He had CABG in 2011. He had cath in 4/12, 4/13, and Jan 2015.  His last cath January 2015 showed 3/4 patent bypass grafts. He had a chronically occluded RCA which filled from left to right collaterals. He had a patent LIMA to LAD and patent SVG to OM1/OM2, with an occluded SVG to PDA/PLA, which was first noted on his 4/12 cath.        He presented to the ER 01/16/14 with chest pain consistent with [place]. He has admitted and ruled out for an MI. Cath done 01/17/14 revealed an occluded SVG, which had previously sequentially supplied the PDA and PLA branches of the RCA, which is old. The plan is for continued medical Rx. Ranexa has been added, Mevacor changed to Lipitor. He will follow up in two weeks. The plan would be to increase Ranexa to 1gm BID as an OP. He was offered samples from the office. I believe medication cost has been an issue in the past. Dr Julianne Handler feels he is stable for discharge 01/18/14.    Discharge Vitals:  Blood pressure 119/60, pulse 71, temperature 97.7 F (36.5 C), temperature source Oral, resp. rate 18, height 5\' 9"  (1.753 m), weight 167 lb 15.9 oz (76.2 kg), SpO2 93.00%.    Labs: Results for orders placed during the hospital encounter of 01/16/14 (from the past 24 hour(s))    TROPONIN I     Status: None   Collection Time    01/17/14 10:35 AM      Result Value Ref Range   Troponin I <0.30  <0.30 ng/mL  GLUCOSE, CAPILLARY     Status: Abnormal   Collection Time    01/17/14 11:18 AM      Result Value Ref Range   Glucose-Capillary 138 (*) 70 - 99 mg/dL  GLUCOSE, CAPILLARY     Status: Abnormal   Collection Time    01/17/14  4:29 PM      Result Value Ref Range   Glucose-Capillary 140 (*) 70 - 99 mg/dL  GLUCOSE, CAPILLARY     Status: Abnormal   Collection Time    01/17/14  6:59 PM      Result Value Ref Range   Glucose-Capillary 117 (*) 70 - 99 mg/dL  GLUCOSE, CAPILLARY     Status: Abnormal   Collection Time    01/18/14  7:44 AM      Result Value Ref Range   Glucose-Capillary 115 (*) 70 - 99 mg/dL   Comment 1 Documented in Chart     Comment 2 Notify RN      Disposition:      Follow-up Information   Follow up with Sanda Klein, MD. (office will contact you about follow up)    Specialty:  Cardiology   Contact information:  [address] [place] [address] [phone]       Discharge Medications:    Medication List    STOP taking these medications       lovastatin 20 MG tablet  Commonly known as:  MEVACOR      TAKE these medications       acetaminophen 325 MG tablet  Commonly known as:  TYLENOL  Take 2 tablets (650 mg total) by mouth every 4 (four) hours as needed for headache or mild pain.     ALPRAZolam 0.5 MG tablet  Commonly known as:  XANAX  Take 0.125-0.5 mg by mouth at bedtime as needed for anxiety (for panic attacks).     aspirin 325 MG tablet  Take 325 mg by mouth daily.     atorvastatin 80 MG tablet  Commonly known as:  LIPITOR  Take 1 tablet (80 mg total) by mouth daily at 6 PM.     dicyclomine 10 MG capsule  Commonly known as:  BENTYL  Take 1 capsule (10 mg total) by mouth 4 (four) times daily -  before meals and at bedtime.     isosorbide mononitrate 30 MG 24 hr tablet  Commonly known as:   IMDUR  Take 30 mg by mouth daily.     lisinopril 10 MG tablet  Commonly known as:  PRINIVIL,ZESTRIL  Take 1 tablet (10 mg total) by mouth daily.     loratadine 10 MG tablet  Commonly known as:  CLARITIN  Take 10 mg by mouth daily.     metFORMIN 500 MG tablet  Commonly known as:  GLUCOPHAGE  Take 1-2 tablets (500-1,000 mg total) by mouth 2 (two) times daily with a meal. Take 1 or 2 tablets depending on blood sugar level  Start taking on:  01/20/2014     naproxen sodium 220 MG tablet  Commonly known as:  ANAPROX  Take 440 mg by mouth 2 (two) times daily as needed (for pain).     nitroGLYCERIN 0.4 MG SL tablet  Commonly known as:  NITROSTAT  Place 1 tablet (0.4 mg total) under the tongue every 5 (five) minutes as needed for chest pain.     ranitidine 150 MG tablet  Commonly known as:  ZANTAC  Take 150 mg by mouth daily as needed for heartburn. For acid reflux     ranolazine 500 MG 12 hr tablet  Commonly known as:  RANEXA  Take 1 tablet (500 mg total) by mouth 2 (two) times daily.     sertraline 50 MG tablet  Commonly known as:  ZOLOFT  Take 50 mg by mouth 2 (two) times daily.         Duration of Discharge Encounter: Greater than 30 minutes including physician time.  Angelena Form PA-C 01/18/2014 9:10 AM

## 2014-01-18 NOTE — Progress Notes (Signed)
Pt insisting on driving himself home despite recommendations to not drive for two days. He said he drove himself to the hospital l and his car is the parking lot,]. Nobody is able to come pick him up. Interim AD was made aware. Pt was educated about risk of bleeding since he has catheterization of left femoral vein yesterday but he still wanted to drive home. AD helped pt get to his car.  Ferdinand Lango, RN

## 2014-01-18 NOTE — Telephone Encounter (Signed)
Samples of ranexa 500 mg placed at the front desk for patient pick up

## 2014-01-18 NOTE — Discharge Instructions (Signed)
Angina Pectoris Angina pectoris is extreme discomfort in your chest, neck, or arm. Your doctor may call it just angina. It is caused by a lack of oxygen to your heart wall. It may feel like tightness or heavy pressure. It may feel like a crushing or squeezing pain. Some people say it feels like gas. It may go down your shoulders, back, and arms. Some people have symptoms other than pain. These include:  Tiredness.  Shortness of breath.  Cold sweats.  Feeling sick to your stomach (nausea). There are four types of angina:  Stable angina. This type often lasts the same amount of time each time it happens. Activity, stress, or excitement can bring it on. It often gets better after taking a medicine called nitroglycerin. This goes under your tongue.  Unstable angina. This type can happen when you are not active or even during sleep. It can suddenly get worse or happen more often. It may not get better after taking the special medicine. It can last up to 30 minutes.  Microvascular angina. This type is more common in women. It may be more severe or last longer than other types.  Prinzmetal angina. This type often happens when you are not active or in the early morning hours. HOME CARE   Only take medicines as told by your doctor.  Stay active or exercise more as told by your doctor.  Limit very hard activity as told by your doctor.  Limit heavy lifting as told by your doctor.  Keep a healthy weight.  Learn about and eat foods that are healthy for your heart.  Do not use any tobacco such as cigarettes, chewing tobacco, or e-cigarettes. GET HELP RIGHT AWAY IF:   You have chest, neck, deep shoulder, or arm pain or discomfort that lasts more than a few minutes.  You have chest, neck, deep shoulder, or arm pain or discomfort that goes away and comes back over and over again.  You have heavy sweating that seems to happen for no reason.  You have shortness of breath or trouble  breathing.  Your angina does not get better after a few minutes of rest.  Your angina does not get better after you take nitroglycerin medicine. These can all be symptoms of a heart attack. Get help right away. Call your local emergency service (911 in [place] Do not  drive yourself to the hospital. Do not  wait to for your symptoms to go away. MAKE SURE YOU:   Understand these instructions.  Will watch your condition.  Will get help right away if you are not doing well or get worse. Document Released: 11/26/2007 Document Revised: 06/14/2013 Document Reviewed: 10/11/2013 [facility] Patient Information 2015 [place], [place]. This information is not intended to replace advice given to you by your health care provider. Make sure you discuss any questions you have with your health care provider. Arteriogram Care After These instructions give you information on caring for yourself after your procedure. Your doctor may also give you more specific instructions. Call your doctor if you have any problems or questions after your procedure. HOME CARE  Keep your leg straight for at least 6 hours.  Do not bathe, swim, or use a hot tub until directed by your doctor. You can shower.  Do not lift anything heavier than 10 pounds (about a gallon of milk) for 2 days.  Do not walk a lot, run, or drive for 2 days.  Return to normal activities in 2 days or as  told by your doctor. Finding out the results of your test Ask when your test results will be ready. Make sure you get your test results. GET HELP RIGHT AWAY IF:   You have fever.  You have more pain in your leg.  The leg that was cut is:  Bleeding.  Puffy (swollen) or red.  Cold.  Pale or changes color.  Weak.  Tingly or numb. If you go to the Emergency Room, tell your nurse that you have had an arteriogram. Take this paper with you to show the nurse. MAKE SURE YOU:  Understand these instructions.  Will watch your condition.  Will  get help right away if you are not doing well or get worse. Document Released: 09/05/2008 Document Revised: 06/14/2013 Document Reviewed: 09/05/2008 [facility] Patient Information 2015 [place], [place]. This information is not intended to replace advice given to you by your health care provider. Make sure you discuss any questions you have with your health care provider.

## 2014-01-18 NOTE — Discharge Summary (Signed)
See full note this am. cdm 

## 2014-01-24 ENCOUNTER — Other Ambulatory Visit: Payer: Self-pay | Admitting: Emergency Medicine

## 2014-01-24 MED ORDER — ALPRAZOLAM 0.5 MG PO TABS: ORAL_TABLET | ORAL | Status: DC

## 2014-01-27 ENCOUNTER — Other Ambulatory Visit: Payer: Self-pay | Admitting: *Deleted

## 2014-01-27 ENCOUNTER — Encounter: Payer: Self-pay | Admitting: Cardiovascular Disease

## 2014-01-27 ENCOUNTER — Ambulatory Visit (INDEPENDENT_AMBULATORY_CARE_PROVIDER_SITE_OTHER): Payer: Self-pay | Admitting: Emergency Medicine

## 2014-01-27 VITALS — BP 132/60 | HR 80 | Temp 97.4°F | Resp 18 | Ht 70.0 in | Wt 169.0 lb

## 2014-01-27 DIAGNOSIS — IMO0002 Reserved for concepts with insufficient information to code with codable children: Secondary | ICD-10-CM

## 2014-01-27 DIAGNOSIS — E118 Type 2 diabetes mellitus with unspecified complications: Principal | ICD-10-CM

## 2014-01-27 DIAGNOSIS — E1165 Type 2 diabetes mellitus with hyperglycemia: Secondary | ICD-10-CM

## 2014-01-27 LAB — GLUCOSE, POCT (MANUAL RESULT ENTRY): POC GLUCOSE: 279 mg/dL — AB (ref 70–99)

## 2014-01-27 LAB — POCT GLYCOSYLATED HEMOGLOBIN (HGB A1C): HEMOGLOBIN A1C: 7.8

## 2014-01-27 MED ORDER — DOXYCYCLINE HYCLATE 100 MG PO TABS: 100.0000 mg | ORAL_TABLET | Freq: Two times a day (BID) | ORAL | Status: DC

## 2014-01-27 NOTE — Progress Notes (Addendum)
   Subjective:    Patient ID: Jason [name], male    DOB: 01/24/1951, 63 y.o.   MRN: [id number] This chart was scribed for Jason Russian, MD by Cathie Hoops, ED Scribe. The patient was seen in Room 8. The patient's care was started at 1:55 PM.   Chief Complaint  Patient presents with  . Hospitalization Follow-up    infection    HPI HPI Comments: Jason [name] is a 63 y.o. male who presents to the [facility] for hospitalization follow-up. Pt reports feeling tired and run down. Pt feels like he is fatigued. Pt reports he has talked to a lawyer for disability benefits but denies he has discussed it with his heart surgeon. Pt reports his neurosurgeon, Dr. Vertell Limber, has him limited to lifting >20 pounds since October 2014. Pt denies illicit drug usage. Pt reports he last used drugs about one year ago. Pt denies he has urges to use drugs. Pt denies frequent alcohol consumption, and reports drinking about 3 beers in the past year. Pt denies he can financially afford Ranexa. Pt states his blood sugar was at normal levels while he was hospitalized. Pt reports his blood sugar level was 210 this morning.    Review of Systems  Constitutional: Positive for fatigue.   Objective:   Physical Exam CONSTITUTIONAL: Well developed/well nourished HEAD: Normocephalic/atraumatic EYES: EOMI/PERRL ENMT: Mucous membranes moist NECK: supple no meningeal signs SPINE:entire spine nontender CV: S1/S2 noted, no murmurs/rubs/gallops noted LUNGS: Lungs are clear to auscultation bilaterally, no apparent distress ABDOMEN: soft, nontender, no rebound or guarding GU:no cva tenderness NEURO: Pt is awake/alert, moves all extremitiesx4 EXTREMITIES: pulses normal, full ROM SKIN: warm, color normal there is a 1 x 2 cm draining cystic area in the left axilla. There is purulence coming from the lesion. This was sent for culture. PSYCH: no abnormalities of mood noted  Filed Vitals:   01/27/14 1347    BP: 132/60  Pulse: 80  Temp: 97.4 F (36.3 C)  TempSrc: Oral  Resp: 18  Height: 5\' 10"  (1.778 m)  Weight: 169 lb (76.658 kg)  SpO2: 96%   Results for orders placed in visit on 01/27/14  GLUCOSE, POCT (MANUAL RESULT ENTRY)      Result Value Ref Range   POC Glucose 279 (*) 70 - 99 mg/dl  POCT GLYCOSYLATED HEMOGLOBIN (HGB A1C)      Result Value Ref Range   Hemoglobin A1C 7.8     Assessment & Plan:  2:00 PM- Patient informed of current plan for treatment and evaluation and agrees with plan at this time he does have drainage site in the left axilla. I put him back on his doxycycline. 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 He is to go to the cardiology office and see about getting samples of the ranexa next he is not able to afford. Her sugars currently under very poor control he understands this

## 2014-01-30 LAB — WOUND CULTURE

## 2014-02-11 ENCOUNTER — Telehealth: Payer: Self-pay | Admitting: Family Medicine

## 2014-02-11 ENCOUNTER — Telehealth: Payer: Self-pay

## 2014-02-11 ENCOUNTER — Other Ambulatory Visit: Payer: Self-pay | Admitting: Emergency Medicine

## 2014-02-11 MED ORDER — ALPRAZOLAM 0.5 MG PO TABS: ORAL_TABLET | ORAL | Status: DC

## 2014-02-11 NOTE — Telephone Encounter (Signed)
Called in RX alprazolam 0.5 mg 1/2-1 tablet up to twice daily prn panic episode #30 2 refills to [facility] [address]. Patient notified and voiced understanding.

## 2014-02-11 NOTE — Telephone Encounter (Signed)
Please call patient we will keep the number at 30 but I did give him 2 refills.

## 2014-02-11 NOTE — Telephone Encounter (Signed)
Patient has decided to stay and be seen. Please disregard telephone message.

## 2014-02-11 NOTE — Telephone Encounter (Signed)
Patient needs refill on alprazolam 0.5 mg. He was given RX 01/24/2014 directions stated take 1/2 to 1 tablet poqd prn panic attacks. Discussed with him this was a 1 month supply according to directions. He stated all previous RX have said 0.5 mg 1 pobid prn panic attacks and that is why he is out and needs a new RX. Please advise

## 2014-02-11 NOTE — Telephone Encounter (Signed)
Patient came in to 102 today and needs a refill on his xanax. Patient does not wish to stay to be seen today but states he is all out of his medication and is experiencing panic attacks. Please return call and advise. CB # [id number]. Patient states he was seen recently. [facility] [address]

## 2014-02-21 ENCOUNTER — Encounter: Payer: Self-pay | Admitting: Cardiology

## 2014-02-21 ENCOUNTER — Ambulatory Visit (INDEPENDENT_AMBULATORY_CARE_PROVIDER_SITE_OTHER): Payer: Self-pay | Admitting: Cardiology

## 2014-02-21 VITALS — BP 112/62 | HR 72 | Ht 69.0 in | Wt 173.6 lb

## 2014-02-21 DIAGNOSIS — I658 Occlusion and stenosis of other precerebral arteries: Secondary | ICD-10-CM

## 2014-02-21 DIAGNOSIS — I6523 Occlusion and stenosis of bilateral carotid arteries: Secondary | ICD-10-CM

## 2014-02-21 DIAGNOSIS — I10 Essential (primary) hypertension: Secondary | ICD-10-CM

## 2014-02-21 DIAGNOSIS — I6529 Occlusion and stenosis of unspecified carotid artery: Secondary | ICD-10-CM

## 2014-02-21 DIAGNOSIS — E785 Hyperlipidemia, unspecified: Secondary | ICD-10-CM

## 2014-02-21 MED ORDER — RANOLAZINE ER 500 MG PO TB12: 500.0000 mg | ORAL_TABLET | Freq: Two times a day (BID) | ORAL | Status: DC

## 2014-02-21 NOTE — Assessment & Plan Note (Signed)
Asymptomatic. 

## 2014-02-21 NOTE — Assessment & Plan Note (Signed)
Currently stable, he feels the Ranexa has helped but its hard for him to afford.

## 2014-02-21 NOTE — Assessment & Plan Note (Signed)
Last cath 7/15-no change

## 2014-02-21 NOTE — Progress Notes (Signed)
02/21/2014 Jason [name]   May 04, 1951  [id number]  Primary Physicia [name], [name], MD Primary Cardiologist: Dr Sallyanne Kuster  HPI:   63 yo male with history of CAD s/p CABG x 4 Feb 1st 2011 with LIMA-LAD, SVG-RI and OM, and SVG-PDA . Other problems include HTN, DM, OA, GERD, HLD, anxiety, depression, COPD, ongoing tobacco abuse. He had a cath in 4/12, 4/13, and Jan 2015. His last cath January 2015 showed 3/4 patent bypass grafts. He has a chronically occluded RCA which filled from left to right collaterals. He had a patent LIMA to LAD and patent SVG to RI/OM, with an occluded SVG to PDA, which was first noted on his 4/12 cath.             He was just admitted7/27/15 with chest pain consistent with [place]. Cath done 01/17/14 revealed an occluded SVG-PDA which had previously sequentially supplied the PDA and PLA branches of the RCA. This was an old finding. The plan is for continued medical Rx. Ranexa has been added, Mevacor changed to Lipitor. He is seen in the office today for follow up. He says he feels better, less arm and chest pain. He is renovating a house for his brother in [employer]. He is having trouble affording his Ranexa.     Current Outpatient Prescriptions  Medication Sig Dispense Refill  . acetaminophen (TYLENOL) 325 MG tablet Take 2 tablets (650 mg total) by mouth every 4 (four) hours as needed for headache or mild pain.      Marland Kitchen ALPRAZolam (XANAX) 0.5 MG tablet Take 1/2-1 tablet as needed for panic episode  30 tablet  2  . aspirin 325 MG tablet Take 325 mg by mouth daily.      Marland Kitchen atorvastatin (LIPITOR) 80 MG tablet Take 1 tablet (80 mg total) by mouth daily at 6 PM.  30 tablet  11  . dicyclomine (BENTYL) 10 MG capsule Take 1 capsule (10 mg total) by mouth 4 (four) times daily -  before meals and at bedtime.  40 capsule  1  . isosorbide mononitrate (IMDUR) 30 MG 24 hr tablet Take 30 mg by mouth daily.      Marland Kitchen lisinopril (PRINIVIL,ZESTRIL) 10 MG tablet Take 1 tablet (10 mg total) by mouth  daily.  30 tablet  5  . loratadine (CLARITIN) 10 MG tablet Take 10 mg by mouth daily.      . metFORMIN (GLUCOPHAGE) 500 MG tablet Take 1-2 tablets (500-1,000 mg total) by mouth 2 (two) times daily with a meal. Take 1 or 2 tablets depending on blood sugar level      . naproxen sodium (ANAPROX) 220 MG tablet Take 440 mg by mouth 2 (two) times daily as needed (for pain).      . nitroGLYCERIN (NITROSTAT) 0.4 MG SL tablet Place 1 tablet (0.4 mg total) under the tongue every 5 (five) minutes as needed for chest pain.  25 tablet  2  . ranitidine (ZANTAC) 150 MG tablet Take 150 mg by mouth daily as needed for heartburn. For acid reflux      . ranolazine (RANEXA) 500 MG 12 hr tablet Take 1 tablet (500 mg total) by mouth 2 (two) times daily.  84 tablet  0  . sertraline (ZOLOFT) 50 MG tablet Take 50 mg by mouth 2 (two) times daily.       No current facility-administered medications for this visit.    Allergies  Allergen Reactions  . Coreg [Carvedilol] Nausea And Vomiting and Other (See  Comments)    Per patient made him dizzy and light sensitive  . Sulfa Antibiotics Nausea And Vomiting and Other (See Comments)    Also headaches     History   Social History  . Marital Status: Divorced    Spouse Name: N/A    Number of Children: N/A  . Years of Education: N/A   Occupational History  . welder-fabricator    Social History Main Topics  . Smoking status: Current Every Day Smoker -- 1.00 packs/day for 40 years    Types: Cigarettes  . Smokeless tobacco: Never Used  . Alcohol Use: Yes     Comment: socially  . Drug Use: No  . Sexual Activity: Not Currently   Other Topics Concern  . Not on file   Social History Narrative   Exercise walking 4 times per week for 1 mile     Review of Systems: General: negative for chills, fever, night sweats or weight changes.  Cardiovascular: negative for chest pain, dyspnea on exertion, edema, orthopnea, palpitations, paroxysmal nocturnal dyspnea or  shortness of breath Dermatological: negative for rash Respiratory: negative for cough or wheezing Urologic: negative for hematuria Abdominal: negative for nausea, vomiting, diarrhea, bright red blood per rectum, melena, or hematemesis Neurologic: negative for visual changes, syncope, or dizziness All other systems reviewed and are otherwise negative except as noted above.    Blood pressure 112/62, pulse 72, height 5\' 9"  (1.753 m), weight 173 lb 9.6 oz (78.744 kg).  General appearance: alert, cooperative and no distress Neck: no adenopathy and no JVD- he has bilateral carotid bruits Lungs: clear to auscultation bilaterally Heart: regular rate and rhythm   ASSESSMENT AND PLAN:   Unstable angina Currently stable, he feels the Ranexa has helped but its hard for him to afford.  CAD, CABG Feb 2011, cath x 4 since-medical Rx Last cath 7/15-no change  HTN (hypertension) Controlled  Hyperlipidemia On statin   DM (diabetes mellitus) Type 2 NIDDM  Carotid stenosis- moderate 2011 Asymptomatic   PLAN  We provided Ranexa samples and will contact him about pt assistance. I reviewed his doppler study from 2011 with Dr Sallyanne Kuster. The pt is asymptomatic and is on a statin and ASA. For now will continue to monitor him for symptoms of TIA or stroke.   Olena Willy KPA-C 02/21/2014 11:15 AM

## 2014-02-21 NOTE — Assessment & Plan Note (Signed)
Type 2 NIDDM 

## 2014-02-21 NOTE — Patient Instructions (Signed)
Your physician recommends that you schedule a follow-up appointment in: 6 months with Dr.Croitoru    

## 2014-02-21 NOTE — Assessment & Plan Note (Signed)
Controlled.  

## 2014-02-21 NOTE — Assessment & Plan Note (Signed)
On statin.

## 2014-03-21 ENCOUNTER — Other Ambulatory Visit: Payer: Self-pay | Admitting: Family Medicine

## 2014-04-04 ENCOUNTER — Other Ambulatory Visit: Payer: Self-pay | Admitting: Emergency Medicine

## 2014-04-04 ENCOUNTER — Telehealth: Payer: Self-pay

## 2014-04-04 MED ORDER — ALPRAZOLAM 0.5 MG PO TABS: ORAL_TABLET | ORAL | Status: DC

## 2014-04-04 NOTE — Telephone Encounter (Signed)
Patient request for a refill on Alprazolam 0.5 MG tablet. [facility] [address] on [address]. Please call patient when medication is sent to pharmacy [phone]

## 2014-04-05 ENCOUNTER — Other Ambulatory Visit: Payer: Self-pay | Admitting: Radiology

## 2014-04-05 NOTE — Telephone Encounter (Signed)
Faxed Alprazolam to pharmacy 

## 2014-04-12 ENCOUNTER — Telehealth: Payer: Self-pay | Admitting: *Deleted

## 2014-04-12 NOTE — Telephone Encounter (Signed)
PATIENT WANTED TO KNOW IF SAMPLES ARE AVAILABLE FOR RANEXA  INFORMED PATIENT NO SAMPLES AVAILABLE .

## 2014-04-28 ENCOUNTER — Other Ambulatory Visit: Payer: Self-pay | Admitting: Emergency Medicine

## 2014-05-11 ENCOUNTER — Other Ambulatory Visit: Payer: Self-pay | Admitting: Emergency Medicine

## 2014-06-01 ENCOUNTER — Encounter (HOSPITAL_COMMUNITY): Payer: Self-pay | Admitting: Internal Medicine

## 2014-06-07 ENCOUNTER — Other Ambulatory Visit: Payer: Self-pay | Admitting: Family Medicine

## 2014-06-21 ENCOUNTER — Ambulatory Visit (INDEPENDENT_AMBULATORY_CARE_PROVIDER_SITE_OTHER): Payer: Self-pay | Admitting: Family Medicine

## 2014-06-21 ENCOUNTER — Other Ambulatory Visit: Payer: Self-pay | Admitting: Emergency Medicine

## 2014-06-21 VITALS — BP 132/74 | HR 87 | Temp 98.0°F | Resp 18 | Ht 69.0 in | Wt 165.0 lb

## 2014-06-21 DIAGNOSIS — F329 Major depressive disorder, single episode, unspecified: Secondary | ICD-10-CM

## 2014-06-21 DIAGNOSIS — E1165 Type 2 diabetes mellitus with hyperglycemia: Secondary | ICD-10-CM

## 2014-06-21 DIAGNOSIS — F419 Anxiety disorder, unspecified: Principal | ICD-10-CM

## 2014-06-21 DIAGNOSIS — IMO0002 Reserved for concepts with insufficient information to code with codable children: Secondary | ICD-10-CM

## 2014-06-21 DIAGNOSIS — R739 Hyperglycemia, unspecified: Secondary | ICD-10-CM

## 2014-06-21 DIAGNOSIS — F418 Other specified anxiety disorders: Secondary | ICD-10-CM

## 2014-06-21 LAB — POCT GLYCOSYLATED HEMOGLOBIN (HGB A1C): HEMOGLOBIN A1C: 9.3

## 2014-06-21 LAB — GLUCOSE, POCT (MANUAL RESULT ENTRY): POC Glucose: 341 mg/dl — AB (ref 70–99)

## 2014-06-21 MED ORDER — ALPRAZOLAM 0.5 MG PO TABS: 0.2500 mg | ORAL_TABLET | Freq: Two times a day (BID) | ORAL | Status: DC | PRN

## 2014-06-21 MED ORDER — METFORMIN HCL 500 MG PO TABS: 500.0000 mg | ORAL_TABLET | Freq: Two times a day (BID) | ORAL | Status: DC

## 2014-06-21 MED ORDER — SERTRALINE HCL 50 MG PO TABS: 50.0000 mg | ORAL_TABLET | Freq: Every day | ORAL | Status: DC

## 2014-06-21 NOTE — Progress Notes (Addendum)
This chart was scribed for Merri Ray, MD by Edison Simon, ED Scribe. This patient was seen in room 2 and the patient's care was started at 6:30 PM.   Subjective:    Patient ID: Jason [name], male    DOB: April 13, 1951, 63 y.o.   MRN: [id number]  Chief Complaint  Patient presents with  . rx refills    metformin, zoloft, xanax  . Hyperglycemia    400 today     HPI  HPI Comments: Jason [name] is a 63 y.o. male with history of diabetes who presents to the [facility] complaining of hyperglycemia and requesting medication refills for metformin, Zoloft, and Xanax. Patient primarily followed by Dr. Everlene Farrier, last seen by him in August for hospital follow up. He is also followed by cardiology with a history of CAD status post CABG in February 2011 with 4 subsequent caths and medical treatment. Last office visit with cardiologist was September 1, most recent cath July of 2015 with no change. Also has a history of peripheral vascular disease, HTN, HLD, and substance abuse in remission. Per review of previous notes by Dr. Everlene Farrier, there is a history of narcotic use, alcohol use, and benzodiazepine.   Diabetes-Has been treated with Metformin 500mg  1-2 twice per day, depending on blood sugar. Last hemoglobin A1C in August 7.8. He states he had nausea, headache, abdominal pain, polyuria, polydipsia, blurry vision, a foul taste in his mouth, and feeling "cloudy headed." He also complains of recent allergies with sinus pressure and eyes running.  He states his blood sugar measured 459 at 0300 this morning and notes his symptoms were worse than they have been; he states he ran out of metformin 5 days ago. He states he has been having hyperglycemia symptoms for the past 3 weeks and has had blood sugar measurements mostly in the 300s. He states it did improve his blood sugar which measured around 250. He states that most days, he used 1 500mg  pill in the morning and 1 at night, but sometimes  would take another when his blood sugar was high. He states blood sugar measured at close to 400 at 1630 this afternoon.   CAD-He states he started having some chest tightness on waking at 0300 this morning that improved after using Ranexa this afternoon.  He states it is similar to what he occasionally has when he misses a dose of Ranexa or when it is wearing off, and he states he is "very familiar" with this chest tightness, but he states he has not missed a dose recently. He denies recent use of cocaine or other drugs. He states he last used cocaine 6-8 months ago. He states he last used alcohol 2-3 weeks ago when he would have just 1 drink occasionally.   Anxiety/panic attacks-History of substance abuse as above. Based on July office visit, he was on Zoloft 50mg  BID. Xanax was refilled at July visit, then per phone note in August, continued on Alprazolam 0.5mg  1/2-1 tablet up to twice daily with #30 and 2 refills. Was also given #30 with 2 refills on October 13. He states he ran out of Zoloft 1 month ago and was using 75mg  a day most of the time; he states he uses 1 PO PAM and 1/2 PO PPM. He suspects his chest tightness is associated with his anxiety as well and notes he is out of Xanax since 1 week ago. His last prescription of Xanax was meant to last until 07/05/2013.  He states he normally uses 1/2 1mg  pill in the morning, another 1/2 pill mid-morning, and 1 pill at night if needed, but adjusts dosage according to severity of anxiety and chest pain. He reports addiction to Xanax in the 1980s, but states he is not addicted now.   1/2 pill morning   PCP: Jenny Reichmann, MD   Patient Active Problem List   Diagnosis Date Noted  . Carotid stenosis- moderate 2011 02/21/2014  . Unstable angina 01/16/2014  . Tobacco abuse 01/16/2014  . Chest pain 07/08/2013  . Substance abuse in remission 10/01/2012  . Radiculitis 02/10/2012  . CAD, CABG Feb 2011, cath x 4 since-medical Rx 10/03/2011  . PVD, hx Rt  femoral endarterectomy 2004 10/03/2011  . DM (diabetes mellitus) 10/02/2011  . HTN (hypertension) 10/02/2011  . Hyperlipidemia 10/02/2011   Past Medical History  Diagnosis Date  . Coronary artery disease     CABG 2/11, cath x 4 since - med Rx  . Hypertension     type 2 NIDDM  . Diabetes mellitus   . Arthritis   . GERD (gastroesophageal reflux disease)   . High cholesterol   . Myocardial infarct     x4 last 10 yrs  . Anxiety     off xanax  and paxil since 3/13  . COPD (chronic obstructive pulmonary disease)   . Pneumonia     hx  . Cancer     tumor basal cell rem from lft arm  . Smoker   . 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 Depression   . Substance abuse   . Cataract   . Heart attack    Past Surgical History  Procedure Laterality Date  . Femoral artery - femoral artery bypass graft Right 2004    femoral enarterectomy  . US extremity*l*      lft arm tumor removed   . Cardiac catheterization  4/12    Medical Rx  . Coronary angioplasty  Jan 2004    RCA  . Back surgery  Jan 2014, Dec 2011    Dr Vertell Limber  . Coronary artery bypass graft  07/24/2009    L-LAD, SVG-RI/OM, SVG-PDA  . Eye surgery      cat bil  . Coronary angiogram  01/17/14    med rx  . Coronary angiogram  4/13    med Rx  . Coronary angiogram  1/15    Med Rx  . Left heart catheterization with coronary angiogram N/A 10/03/2011    Procedure: LEFT HEART CATHETERIZATION WITH CORONARY ANGIOGRAM;  Surgeon: Pixie Casino, MD;  Location: [facility] CATH LAB;  Service: Cardiovascular;  Laterality: N/A;  . Left heart catheterization with coronary angiogram N/A 07/08/2013    Procedure: LEFT HEART CATHETERIZATION WITH CORONARY ANGIOGRAM;  Surgeon: Blane Ohara, MD;  Location: [facility] CATH LAB;  Service: Cardiovascular;  Laterality: N/A;  . Left heart catheterization with coronary/graft angiogram N/A 01/17/2014    Procedure: LEFT HEART CATHETERIZATION WITH Beatrix Fetters;  Surgeon:  Troy Sine, MD;  Location: [facility] CATH LAB;  Service: Cardiovascular;  Laterality: N/A;   Allergies  Allergen Reactions  . Coreg [Carvedilol] Nausea And Vomiting and Other (See Comments)    Per patient made him dizzy and light sensitive  . Sulfa Antibiotics Nausea And Vomiting and Other (See Comments)    Also headaches    Prior to Admission medications   Medication Sig Start Date End Date Taking? Authorizing Provider  acetaminophen (TYLENOL) 325  MG tablet Take 2 tablets (650 mg total) by mouth every 4 (four) hours as needed for headache or mild pain. 01/18/14  Yes Erlene Quan, PA-C  aspirin 325 MG tablet Take 325 mg by mouth daily.   Yes Historical Provider, MD  atorvastatin (LIPITOR) 80 MG tablet Take 1 tablet (80 mg total) by mouth daily at 6 PM. 01/18/14  Yes Erlene Quan, PA-C  dicyclomine (BENTYL) 10 MG capsule TAKE ONE CAPSULE BY MOUTH 4 TIMES A DAY FOR BEFORE MEALS AND AT BEDTIME 03/22/14  Yes Posey Boyer, MD  isosorbide mononitrate (IMDUR) 30 MG 24 hr tablet Take 30 mg by mouth daily.   Yes Historical Provider, MD  lisinopril (PRINIVIL,ZESTRIL) 10 MG tablet Take 1 tablet (10 mg total) by mouth daily. PATIENT NEEDS BLOOD PRESSURE CHECK UP FOR ADDITIONAL REFILLS 06/08/14  Yes [name], MD  loratadine (CLARITIN) 10 MG tablet Take 10 mg by mouth daily.   Yes Historical Provider, MD  metFORMIN (GLUCOPHAGE) 500 MG tablet Take 1-2 tablets (500-1,000 mg total) by mouth 2 (two) times daily with a meal. Take 1 or 2 tablets depending on blood sugar level 01/20/14  Yes Luke K Kilroy, PA-C  naproxen sodium (ANAPROX) 220 MG tablet Take 440 mg by mouth 2 (two) times daily as needed (for pain).   Yes Historical Provider, MD  nitroGLYCERIN (NITROSTAT) 0.4 MG SL tablet Place 1 tablet (0.4 mg total) under the tongue every 5 (five) minutes as needed for chest pain. 01/18/14  Yes Luke K Kilroy, PA-C  ranitidine (ZANTAC) 150 MG tablet Take 150 mg by mouth daily as needed for heartburn. For acid reflux    Yes Historical Provider, MD  ranolazine (RANEXA) 500 MG 12 hr tablet Take 1 tablet (500 mg total) by mouth 2 (two) times daily. 02/21/14  Yes Luke K Kilroy, PA-C  sertraline (ZOLOFT) 50 MG tablet Take 50 mg by mouth 2 (two) times daily.   Yes Historical Provider, MD  ALPRAZolam Duanne Moron) 0.5 MG tablet Take 1/2-1 tablet as needed for panic episode Patient not taking: Reported on 06/21/2014 04/04/14   [name], MD   History   Social History  . Marital Status: Divorced    Spouse Name: N/A    Number of Children: N/A  . Years of Education: N/A   Occupational History  . welder-fabricator    Social History Main Topics  . Smoking status: Current Every Day Smoker -- 1.00 packs/day for 40 years    Types: Cigarettes  . Smokeless tobacco: Never Used  . Alcohol Use: Yes     Comment: socially  . Drug Use: No  . Sexual Activity: Not Currently   Other Topics Concern  . Not on file   Social History Narrative   Exercise walking 4 times per week for 1 mile     Review of Systems  Constitutional: Negative for fever.  HENT: Positive for sinus pressure.   Eyes: Positive for visual disturbance.  Respiratory: Positive for chest tightness.   Cardiovascular: Positive for chest pain.  Gastrointestinal: Positive for nausea and abdominal pain.  Endocrine: Positive for polydipsia and polyuria.  Neurological: Positive for headaches.       Objective:   Physical Exam  Constitutional: He is oriented to person, place, and time. He appears well-developed and well-nourished.  HENT:  Head: Normocephalic and atraumatic.  Eyes: EOM are normal. Pupils are equal, round, and reactive to light.  Neck: No JVD present. Carotid bruit is not present.  Cardiovascular: Normal  rate, regular rhythm and normal heart sounds.   No murmur heard. Pulmonary/Chest: Effort normal and breath sounds normal. He has no rales.  Abdominal: Soft. He exhibits no distension. There is no tenderness.  Musculoskeletal: He  exhibits no edema.  Neurological: He is alert and oriented to person, place, and time.  Skin: Skin is warm and dry.  Psychiatric: He has a normal mood and affect.  Vitals reviewed.   Filed Vitals:   06/21/14 1818  BP: 132/74  Pulse: 87  Temp: 98 F (36.7 C)  TempSrc: Oral  Resp: 18  Height: 5\' 9"  (1.753 m)  Weight: 165 lb (74.844 kg)  SpO2: 99%    Results for orders placed or performed in visit on 06/21/14  POCT glucose (manual entry)  Result Value Ref Range   POC Glucose 341 (A) 70 - 99 mg/dl  POCT glycosylated hemoglobin (Hb A1C)  Result Value Ref Range   Hemoglobin A1C 9.3        Assessment & Plan:   JAHSON EMANUELE is a 63 y.o. male Anxiety and depression - Plan: sertraline (ZOLOFT) 50 MG tablet, ALPRAZolam (XANAX) 0.5 MG tablet  - refilled zoloft and Xanax for #30 only - advised to follow up with PCP to discuss these medicines further.   Hyperglycemia - Plan: POCT glucose (manual entry), POCT glycosylated hemoglobin (Hb A1C), metFORMIN (GLUCOPHAGE) 500 MG tablet, DM (diabetes mellitus), type 2, uncontrolled - Plan: POCT glucose (manual entry), POCT glycosylated hemoglobin (Hb A1C), metFORMIN (GLUCOPHAGE) 500 MG tablet  - uncontrolled off metformin, but VSS, and nontocic appearing on exam.  Planned on BMP, but this was not sent while pt in clinic. Option discussed for ER eval, but decided on trial of outpatient treatement with restart of metformin, plan on recheck with Dr.[name] in 3 days, but if any worsening of nausea or abdominal soreness, or otherwise worsening overnight to go to ER for eval.   -As no BMP was obtained - will have staff call and check his status on 06/22/14 to determine if ER eval needed.   Reported chest pain earlier in day, but typical symptoms as Ranexa wears off - asymptomatic in office. If pain recurs, or new chest pains - to ER or call 911. Understanding expressed.   Meds ordered this encounter  Medications  . sertraline (ZOLOFT) 50 MG tablet      Sig: Take 1 tablet (50 mg total) by mouth daily.    Dispense:  30 tablet    Refill:  3  . metFORMIN (GLUCOPHAGE) 500 MG tablet    Sig: Take 1 tablet (500 mg total) by mouth 2 (two) times daily with a meal.  . ALPRAZolam (XANAX) 0.5 MG tablet    Sig: Take 0.5-1 tablets (0.25-0.5 mg total) by mouth 2 (two) times daily as needed for anxiety. Or panic episode.    Dispense:  30 tablet    Refill:  0   Patient Instructions  Restart the metformin at 500mg  twice per day(we may need to increase this to higher dose, but can start at this dose). Keep a record of your blood sugars outside of the office and bring them to the next office visit. Follow up with Dr. Everlene Farrier in 3 days - Saturday 06/24/14 at 8 am to recheck your sugar.  I am checking other electrolyte tests, but if your fatigue, abdominal symptoms, nausea worsen tonight or other worsening symptoms prior to visit with Dr. Everlene Farrier - go to an emergency room or call 911 as we discussed.  I will refill xanax once today, but further refills will need to be given by Dr. Everlene Farrier - see policy below. I restarted the zoloft at 50mg  once per day as this is usual frequency this is taken.  Discuss with Dr. Everlene Farrier if you need to be on a higher dose.  Return to the clinic or go to the nearest emergency room if any of your symptoms worsen or new symptoms occur.   [facility] Policy for Prescribing Controlled Substances (Revised 04/2012) 1. Prescriptions for controlled substances will be filled by ONE provider at [facility] with whom you have established and developed a plan for your care, including follow-up. 2. You are encouraged to schedule an appointment with your prescriber at our appointment center for follow-up visits whenever possible. 3. If you request a prescription for the controlled substance while at [facility] for an acute problem (with someone other than your regular prescriber), you MAY be given a ONE-TIME prescription for a 30-day supply of the controlled substance, to  allow time for you to return to see your regular prescriber for additional prescriptions.    I personally performed the services described in this documentation, which was scribed in my presence. The recorded information has been reviewed and considered, and addended by me as needed.

## 2014-06-21 NOTE — Patient Instructions (Addendum)
Restart the metformin at 500mg  twice per day(we may need to increase this to higher dose, but can start at this dose). Keep a record of your blood sugars outside of the office and bring them to the next office visit. Follow up with Dr. Everlene Farrier in 3 days - Saturday 06/24/14 at 8 am to recheck your sugar.  I am checking other electrolyte tests, but if your fatigue, abdominal symptoms, nausea worsen tonight or other worsening symptoms prior to visit with Dr. Everlene Farrier - go to an emergency room or call 911 as we discussed.    I will refill xanax once today, but further refills will need to be given by Dr. Everlene Farrier - see policy below. I restarted the zoloft at 50mg  once per day as this is usual frequency this is taken.  Discuss with Dr. Everlene Farrier if you need to be on a higher dose.  Return to the clinic or go to the nearest emergency room if any of your symptoms worsen or new symptoms occur.   [facility] Policy for Prescribing Controlled Substances (Revised 04/2012) 1. Prescriptions for controlled substances will be filled by ONE provider at [facility] with whom you have established and developed a plan for your care, including follow-up. 2. You are encouraged to schedule an appointment with your prescriber at our appointment center for follow-up visits whenever possible. 3. If you request a prescription for the controlled substance while at [facility] for an acute problem (with someone other than your regular prescriber), you MAY be given a ONE-TIME prescription for a 30-day supply of the controlled substance, to allow time for you to return to see your regular prescriber for additional prescriptions.

## 2014-06-22 ENCOUNTER — Telehealth: Payer: Self-pay | Admitting: *Deleted

## 2014-06-22 NOTE — Telephone Encounter (Signed)
Spoke to pt- he states he is feeling better. His blood sugar is coming down. Pt states that he is a little tired but nothing horrible.    Pt is going to stop by on Saturday as advised during OV however, pt states he was to see Dr. Everlene Farrier- Dr. Everlene Farrier is not on the schedule on Saturday. Pt will be seeing Dr. Carlota Raspberry.

## 2014-06-22 NOTE — Telephone Encounter (Signed)
Pt was seen by Dr. Carlota Raspberry 12/30. During OV pt had a hyperglycemia. Dr. Carlota Raspberry had ordered a BMP and it appears the test did not get ordered. Dr. Carlota Raspberry called the office to have [place] call and check on the pt. If feeling the same as last night or worse pt needs to RTC or go to the ED.    LM for pt to rtn call.- Lm for emergency contact also.

## 2014-06-27 ENCOUNTER — Encounter: Payer: Self-pay | Admitting: Emergency Medicine

## 2014-06-27 ENCOUNTER — Ambulatory Visit (INDEPENDENT_AMBULATORY_CARE_PROVIDER_SITE_OTHER): Payer: Self-pay | Admitting: Emergency Medicine

## 2014-06-27 VITALS — BP 132/69 | HR 74 | Temp 97.4°F | Resp 16 | Ht 69.0 in | Wt 169.0 lb

## 2014-06-27 DIAGNOSIS — F419 Anxiety disorder, unspecified: Secondary | ICD-10-CM

## 2014-06-27 DIAGNOSIS — F329 Major depressive disorder, single episode, unspecified: Secondary | ICD-10-CM

## 2014-06-27 DIAGNOSIS — E1165 Type 2 diabetes mellitus with hyperglycemia: Secondary | ICD-10-CM

## 2014-06-27 DIAGNOSIS — F418 Other specified anxiety disorders: Secondary | ICD-10-CM

## 2014-06-27 DIAGNOSIS — IMO0002 Reserved for concepts with insufficient information to code with codable children: Secondary | ICD-10-CM

## 2014-06-27 DIAGNOSIS — F32A Depression, unspecified: Secondary | ICD-10-CM

## 2014-06-27 DIAGNOSIS — Z23 Encounter for immunization: Secondary | ICD-10-CM

## 2014-06-27 LAB — GLUCOSE, POCT (MANUAL RESULT ENTRY): POC Glucose: 130 mg/dl — AB (ref 70–99)

## 2014-06-27 MED ORDER — ALPRAZOLAM 0.5 MG PO TABS: 0.2500 mg | ORAL_TABLET | Freq: Two times a day (BID) | ORAL | Status: DC | PRN

## 2014-06-27 NOTE — Progress Notes (Signed)
Subjective:  This chart was scribed for Jason Jordan, MD by Jason [name], ED Scribe at [facility].The patient was seen in exam room 23 and the patient's care was started at 11:30 AM.   Patient ID: Jason [name], male    DOB: 1951/05/16, 64 y.o.   MRN: [id number] Chief Complaint  Patient presents with   Follow-up   Diabetes    HPI  HPI Comments: Jason [name] is a 64 y.o. male who presents to [facility] for a diabetes follow up. Last AIC was 9.3 with a sugar of 341. Pt has been ok, yesterday and today was been good since he has gotten back on the metformin. He ran out of his metformin and was off it for 1 week. He states his sugar was up and down. Pt has not changed his diet. Ran out of his alprazolam due to financial constraints around Christmas and states he started having panic attacks. He is currently back on all his medications. Pt has been unable to work and is in the process of applying for disability. He is going to use his anxiety, heart disease and back pain as his main complaints. Will see his cardiologist in 1.5 months ago. He is taking Ranexa which improves his CP but it puts him to sleep. He has not gotten the flu shot. Pt was a Building control surveyor for 4 years. He is currently smoking 1 p.p.d. He does not drink. 6 weeks ago he did take cocaine, he smoke it. He does not go to meetings. He needs prescription refills for alprazolam, pt reports his current dosage is not enough.. He is taking his Zoloft. He has a good relationship with his minister at his church.   Patient Active Problem List   Diagnosis Date Noted   Carotid stenosis- moderate 2011 02/21/2014   Unstable angina 01/16/2014   Tobacco abuse 01/16/2014   Chest pain 07/08/2013   Substance abuse in remission 10/01/2012   Radiculitis 02/10/2012   CAD, CABG Feb 2011, cath x 4 since-medical Rx 10/03/2011   PVD, hx Rt femoral endarterectomy 2004 10/03/2011   DM (diabetes mellitus) 10/02/2011   HTN  (hypertension) 10/02/2011   Hyperlipidemia 10/02/2011   Past Medical History  Diagnosis Date   Coronary artery disease     CABG 2/11, cath x 4 since - med Rx   Hypertension     type 2 NIDDM   Diabetes mellitus    Arthritis    GERD (gastroesophageal reflux disease)    High cholesterol    Myocardial infarct     x4 last 10 yrs   Anxiety     off xanax  and paxil since 3/13   COPD (chronic obstructive pulmonary disease)    Pneumonia     hx   Cancer     tumor basal cell rem from lft arm   Smoker    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)    Depression    Substance abuse    Cataract    Heart attack    Past Surgical History  Procedure Laterality Date   Femoral artery - femoral artery bypass graft Right 2004    femoral enarterectomy   US extremity*l*      lft arm tumor removed    Cardiac catheterization  4/12    Medical Rx   Coronary angioplasty  Jan 2004    RCA   Back surgery  Jan 2014, Dec 2011    Dr  Vertell Limber   Coronary artery bypass graft  07/24/2009    L-LAD, SVG-RI/OM, SVG-PDA   Eye surgery      cat bil   Coronary angiogram  01/17/14    med rx   Coronary angiogram  4/13    med Rx   Coronary angiogram  1/15    Med Rx   Left heart catheterization with coronary angiogram N/A 10/03/2011    Procedure: LEFT HEART CATHETERIZATION WITH CORONARY ANGIOGRAM;  Surgeon: Pixie Casino, MD;  Location: [facility] CATH LAB;  Service: Cardiovascular;  Laterality: N/A;   Left heart catheterization with coronary angiogram N/A 07/08/2013    Procedure: LEFT HEART CATHETERIZATION WITH CORONARY ANGIOGRAM;  Surgeon: Blane Ohara, MD;  Location: [facility] CATH LAB;  Service: Cardiovascular;  Laterality: N/A;   Left heart catheterization with coronary/graft angiogram N/A 01/17/2014    Procedure: LEFT HEART CATHETERIZATION WITH Beatrix Fetters;  Surgeon: Troy Sine, MD;  Location: [facility] CATH LAB;  Service: Cardiovascular;  Laterality:  N/A;   Allergies  Allergen Reactions   Coreg [Carvedilol] Nausea And Vomiting and Other (See Comments)    Per patient made him dizzy and light sensitive   Sulfa Antibiotics Nausea And Vomiting and Other (See Comments)    Also headaches    Prior to Admission medications   Medication Sig Start Date End Date Taking? Authorizing Provider  acetaminophen (TYLENOL) 325 MG tablet Take 2 tablets (650 mg total) by mouth every 4 (four) hours as needed for headache or mild pain. 01/18/14  Yes Luke K Kilroy, PA-C  ALPRAZolam Duanne Moron) 0.5 MG tablet Take 0.5-1 tablets (0.25-0.5 mg total) by mouth 2 (two) times daily as needed for anxiety. Or panic episode. 06/21/14  Yes Wendie Agreste, MD  aspirin 325 MG tablet Take 325 mg by mouth daily.   Yes Historical Provider, MD  atorvastatin (LIPITOR) 80 MG tablet Take 1 tablet (80 mg total) by mouth daily at 6 PM. 01/18/14  Yes Erlene Quan, PA-C  dicyclomine (BENTYL) 10 MG capsule TAKE ONE CAPSULE BY MOUTH 4 TIMES A DAY FOR BEFORE MEALS AND AT BEDTIME 03/22/14  Yes Posey Boyer, MD  isosorbide mononitrate (IMDUR) 30 MG 24 hr tablet Take 30 mg by mouth daily.   Yes Historical Provider, MD  lisinopril (PRINIVIL,ZESTRIL) 10 MG tablet Take 1 tablet (10 mg total) by mouth daily. PATIENT NEEDS BLOOD PRESSURE CHECK UP FOR ADDITIONAL REFILLS 06/08/14  Yes [name], MD  loratadine (CLARITIN) 10 MG tablet Take 10 mg by mouth daily.   Yes Historical Provider, MD  metFORMIN (GLUCOPHAGE) 500 MG tablet Take 1 tablet (500 mg total) by mouth 2 (two) times daily with a meal. 06/21/14  Yes Wendie Agreste, MD  naproxen sodium (ANAPROX) 220 MG tablet Take 440 mg by mouth 2 (two) times daily as needed (for pain).   Yes Historical Provider, MD  nitroGLYCERIN (NITROSTAT) 0.4 MG SL tablet Place 1 tablet (0.4 mg total) under the tongue every 5 (five) minutes as needed for chest pain. 01/18/14  Yes Luke K Kilroy, PA-C  ranitidine (ZANTAC) 150 MG tablet Take 150 mg by mouth daily as  needed for heartburn. For acid reflux   Yes Historical Provider, MD  ranolazine (RANEXA) 500 MG 12 hr tablet Take 1 tablet (500 mg total) by mouth 2 (two) times daily. 02/21/14  Yes Luke K Kilroy, PA-C  sertraline (ZOLOFT) 50 MG tablet Take 1 tablet (50 mg total) by mouth daily. 06/21/14  Yes Wendie Agreste, MD  History   Social History   Marital Status: Divorced    Spouse Name: N/A    Number of Children: N/A   Years of Education: N/A   Occupational History   welder-fabricator    Social History Main Topics   Smoking status: Current Every Day Smoker -- 1.00 packs/day for 40 years    Types: Cigarettes   Smokeless tobacco: Never Used   Alcohol Use: Yes     Comment: socially   Drug Use: No   Sexual Activity: Not Currently   Other Topics Concern   Not on file   Social History Narrative   Exercise walking 4 times per week for 1 mile   Review of Systems  Cardiovascular: Positive for chest pain.  Musculoskeletal: Positive for back pain.  Psychiatric/Behavioral: The patient is nervous/anxious.       Objective:  BP 132/69 mmHg   Pulse 74   Temp(Src) 97.4 F (36.3 C)   Resp 16   Ht 5\' 9"  (1.753 m)   Wt 169 lb (76.658 kg)   BMI 24.95 kg/m2   SpO2 100%  Physical Exam  Constitutional: He is oriented to person, place, and time. He appears well-developed and well-nourished.  HENT:  Head: Normocephalic and atraumatic.  Poor dentition.  Eyes: EOM are normal.  Neck: Normal range of motion.  Cardiovascular: Normal rate.   Diminished pluses in both feet.  Pulmonary/Chest: Effort normal.  Decreased breath sounds in the bases.  Musculoskeletal: Normal range of motion.  Neurological: He is alert and oriented to person, place, and time.  Skin: Skin is warm and dry.  No ulcerations or skin breakdowns.  Psychiatric: He has a normal mood and affect. His behavior is normal.  Nursing note and vitals reviewed.      Assessment & Plan:  Patient stable at present. He does better  when he takes his Xanax twice a day. He has had 2 episodes of admitted cocaine use since he was last here. He continues to smoke a pack a day. I encouraged him to be active in the church. He has a good relationship with his pastor there. [facility] is going [facility]. Recheck 3 months his Xanax was refilled.

## 2014-06-27 NOTE — Progress Notes (Signed)
Subjective:  This chart was scribed for Nena Jordan, MD by [name], ED Scribe at [facility].The patient was seen in exam room 23 and the patient's care was started at 11:30 AM.   Patient ID: Jason [name], male    DOB: 1950/11/24, 64 y.o.   MRN: [id number] Chief Complaint  Patient presents with  . Follow-up  . Diabetes    Diabetes Hypoglycemia symptoms include nervousness/anxiousness. Associated symptoms include chest pain.    HPI Comments: Jason [name] is a 64 y.o. male who presents to [facility] for a diabetes follow up. Last AIC was 9.3 with a sugar of 341. Pt has been ok, yesterday and today was been good since he has gotten back on the metformin. He ran out of his metformin and was off it for 1 week. He states his sugar was up and down. Pt has not changed his diet. Ran out of his alprazolam due to financial constraints around Christmas and states he started having panic attacks. He is currently back on all his medications. Pt has been unable to work and is in the process of applying for disability. He is going to use his anxiety, heart disease and back pain as his main complaints. Will see his cardiologist in 1.5 months ago. He is taking Ranexa which improves his CP but it puts him to sleep. He has not gotten the flu shot. Pt was a Building control surveyor for 4 years. He is currently smoking 1 p.p.d. He does not drink. 6 weeks ago he did take cocaine, he smoke it. He does not go to meetings. He needs prescription refills for alprazolam, pt reports his current dosage is not enough.. He is taking his Zoloft. He has a good relationship with his minister at his church.   Patient Active Problem List   Diagnosis Date Noted  . Carotid stenosis- moderate 2011 02/21/2014  . Unstable angina 01/16/2014  . Tobacco abuse 01/16/2014  . Chest pain 07/08/2013  . Substance abuse in remission 10/01/2012  . Radiculitis 02/10/2012  . CAD, CABG Feb 2011, cath x 4 since-medical Rx 10/03/2011    . PVD, hx Rt femoral endarterectomy 2004 10/03/2011  . DM (diabetes mellitus) 10/02/2011  . HTN (hypertension) 10/02/2011  . Hyperlipidemia 10/02/2011   Past Medical History  Diagnosis Date  . Coronary artery disease     CABG 2/11, cath x 4 since - med Rx  . Hypertension     type 2 NIDDM  . Diabetes mellitus   . Arthritis   . GERD (gastroesophageal reflux disease)   . High cholesterol   . Myocardial infarct     x4 last 10 yrs  . Anxiety     off xanax  and paxil since 3/13  . COPD (chronic obstructive pulmonary disease)   . Pneumonia     hx  . Cancer     tumor basal cell rem from lft arm  . Smoker   . 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 Depression   . Substance abuse   . Cataract   . Heart attack    Past Surgical History  Procedure Laterality Date  . Femoral artery - femoral artery bypass graft Right 2004    femoral enarterectomy  . US extremity*l*      lft arm tumor removed   . Cardiac catheterization  4/12    Medical Rx  . Coronary angioplasty  Jan 2004    RCA  .  Back surgery  Jan 2014, Dec 2011    Dr Vertell Limber  . Coronary artery bypass graft  07/24/2009    L-LAD, SVG-RI/OM, SVG-PDA  . Eye surgery      cat bil  . Coronary angiogram  01/17/14    med rx  . Coronary angiogram  4/13    med Rx  . Coronary angiogram  1/15    Med Rx  . Left heart catheterization with coronary angiogram N/A 10/03/2011    Procedure: LEFT HEART CATHETERIZATION WITH CORONARY ANGIOGRAM;  Surgeon: Pixie Casino, MD;  Location: [facility] CATH LAB;  Service: Cardiovascular;  Laterality: N/A;  . Left heart catheterization with coronary angiogram N/A 07/08/2013    Procedure: LEFT HEART CATHETERIZATION WITH CORONARY ANGIOGRAM;  Surgeon: Blane Ohara, MD;  Location: [facility] CATH LAB;  Service: Cardiovascular;  Laterality: N/A;  . Left heart catheterization with coronary/graft angiogram N/A 01/17/2014    Procedure: LEFT HEART CATHETERIZATION WITH Beatrix Fetters;  Surgeon: Troy Sine, MD;  Location: [facility] CATH LAB;  Service: Cardiovascular;  Laterality: N/A;   Allergies  Allergen Reactions  . Coreg [Carvedilol] Nausea And Vomiting and Other (See Comments)    Per patient made him dizzy and light sensitive  . Sulfa Antibiotics Nausea And Vomiting and Other (See Comments)    Also headaches    Prior to Admission medications   Medication Sig Start Date End Date Taking? Authorizing Provider  acetaminophen (TYLENOL) 325 MG tablet Take 2 tablets (650 mg total) by mouth every 4 (four) hours as needed for headache or mild pain. 01/18/14  Yes Luke K Kilroy, PA-C  ALPRAZolam Duanne Moron) 0.5 MG tablet Take 0.5-1 tablets (0.25-0.5 mg total) by mouth 2 (two) times daily as needed for anxiety. Or panic episode. 06/21/14  Yes Wendie Agreste, MD  aspirin 325 MG tablet Take 325 mg by mouth daily.   Yes Historical Provider, MD  atorvastatin (LIPITOR) 80 MG tablet Take 1 tablet (80 mg total) by mouth daily at 6 PM. 01/18/14  Yes Erlene Quan, PA-C  dicyclomine (BENTYL) 10 MG capsule TAKE ONE CAPSULE BY MOUTH 4 TIMES A DAY FOR BEFORE MEALS AND AT BEDTIME 03/22/14  Yes Posey Boyer, MD  isosorbide mononitrate (IMDUR) 30 MG 24 hr tablet Take 30 mg by mouth daily.   Yes Historical Provider, MD  lisinopril (PRINIVIL,ZESTRIL) 10 MG tablet Take 1 tablet (10 mg total) by mouth daily. PATIENT NEEDS BLOOD PRESSURE CHECK UP FOR ADDITIONAL REFILLS 06/08/14  Yes [name], MD  loratadine (CLARITIN) 10 MG tablet Take 10 mg by mouth daily.   Yes Historical Provider, MD  metFORMIN (GLUCOPHAGE) 500 MG tablet Take 1 tablet (500 mg total) by mouth 2 (two) times daily with a meal. 06/21/14  Yes Wendie Agreste, MD  naproxen sodium (ANAPROX) 220 MG tablet Take 440 mg by mouth 2 (two) times daily as needed (for pain).   Yes Historical Provider, MD  nitroGLYCERIN (NITROSTAT) 0.4 MG SL tablet Place 1 tablet (0.4 mg total) under the tongue every 5 (five) minutes as needed for chest  pain. 01/18/14  Yes Luke K Kilroy, PA-C  ranitidine (ZANTAC) 150 MG tablet Take 150 mg by mouth daily as needed for heartburn. For acid reflux   Yes Historical Provider, MD  ranolazine (RANEXA) 500 MG 12 hr tablet Take 1 tablet (500 mg total) by mouth 2 (two) times daily. 02/21/14  Yes Luke K Kilroy, PA-C  sertraline (ZOLOFT) 50 MG tablet Take 1 tablet (50 mg total) by  mouth daily. 06/21/14  Yes Wendie Agreste, MD   History   Social History  . Marital Status: Divorced    Spouse Name: N/A    Number of Children: N/A  . Years of Education: N/A   Occupational History  . welder-fabricator    Social History Main Topics  . Smoking status: Current Every Day Smoker -- 1.00 packs/day for 40 years    Types: Cigarettes  . Smokeless tobacco: Never Used  . Alcohol Use: Yes     Comment: socially  . Drug Use: No  . Sexual Activity: Not Currently   Other Topics Concern  . Not on file   Social History Narrative   Exercise walking 4 times per week for 1 mile   Review of Systems  Cardiovascular: Positive for chest pain.  Musculoskeletal: Positive for back pain.  Psychiatric/Behavioral: The patient is nervous/anxious.       Objective:  BP 132/69 mmHg  Pulse 74  Temp(Src) 97.4 F (36.3 C)  Resp 16  Ht 5\' 9"  (1.753 m)  Wt 169 lb (76.658 kg)  BMI 24.95 kg/m2  SpO2 100%  Physical Exam  Constitutional: He is oriented to person, place, and time. He appears well-developed and well-nourished.  HENT:  Head: Normocephalic and atraumatic.  Poor dentition.  Eyes: EOM are normal.  Neck: Normal range of motion.  Cardiovascular: Normal rate.   Diminished pluses in both feet.  Pulmonary/Chest: Effort normal.  Decreased breath sounds in the bases.  Musculoskeletal: Normal range of motion.  Neurological: He is alert and oriented to person, place, and time.  Skin: Skin is warm and dry.  No ulcerations or skin breakdowns.  Psychiatric: He has a normal mood and affect. His behavior is normal.    Nursing note and vitals reviewed.  Results for orders placed or performed in visit on 06/27/14  POCT glucose (manual entry)  Result Value Ref Range   POC Glucose 130 (A) 70 - 99 mg/dl     Assessment & Plan:  Patient stable at present. He does better when he takes his Xanax twice a day. He has had 2 episodes of admitted cocaine use since he was last here. He continues to smoke a pack a day. I encouraged him to be active in the church. He has a good relationship with his pastor there. [facility]. Recheck 3 months his Xanax was refilled. Random sugar today was 130. Recheck 3-4 months.I personally performed the services described in this documentation, which was scribed in my presence. The recorded information has been reviewed and is accurate.

## 2014-08-21 ENCOUNTER — Other Ambulatory Visit: Payer: Self-pay | Admitting: Emergency Medicine

## 2014-09-26 ENCOUNTER — Ambulatory Visit (INDEPENDENT_AMBULATORY_CARE_PROVIDER_SITE_OTHER): Payer: Self-pay | Admitting: Emergency Medicine

## 2014-09-26 ENCOUNTER — Encounter: Payer: Self-pay | Admitting: Emergency Medicine

## 2014-09-26 VITALS — BP 114/68 | HR 89 | Temp 97.5°F | Resp 16 | Ht 68.5 in | Wt 167.8 lb

## 2014-09-26 DIAGNOSIS — F419 Anxiety disorder, unspecified: Secondary | ICD-10-CM

## 2014-09-26 DIAGNOSIS — Z23 Encounter for immunization: Secondary | ICD-10-CM

## 2014-09-26 DIAGNOSIS — R0989 Other specified symptoms and signs involving the circulatory and respiratory systems: Secondary | ICD-10-CM

## 2014-09-26 DIAGNOSIS — F418 Other specified anxiety disorders: Secondary | ICD-10-CM

## 2014-09-26 DIAGNOSIS — I25709 Atherosclerosis of coronary artery bypass graft(s), unspecified, with unspecified angina pectoris: Secondary | ICD-10-CM

## 2014-09-26 DIAGNOSIS — R5382 Chronic fatigue, unspecified: Secondary | ICD-10-CM

## 2014-09-26 DIAGNOSIS — E1165 Type 2 diabetes mellitus with hyperglycemia: Secondary | ICD-10-CM

## 2014-09-26 DIAGNOSIS — IMO0002 Reserved for concepts with insufficient information to code with codable children: Secondary | ICD-10-CM

## 2014-09-26 DIAGNOSIS — F329 Major depressive disorder, single episode, unspecified: Secondary | ICD-10-CM

## 2014-09-26 LAB — TSH: TSH: 3.542 u[IU]/mL (ref 0.350–4.500)

## 2014-09-26 LAB — BASIC METABOLIC PANEL WITH GFR
BUN: 13 mg/dL (ref 6–23)
CHLORIDE: 98 meq/L (ref 96–112)
CO2: 22 mEq/L (ref 19–32)
Calcium: 9.3 mg/dL (ref 8.4–10.5)
Creat: 1.14 mg/dL (ref 0.50–1.35)
GFR, EST AFRICAN AMERICAN: 79 mL/min
GFR, Est Non African American: 69 mL/min
Glucose, Bld: 181 mg/dL — ABNORMAL HIGH (ref 70–99)
POTASSIUM: 5 meq/L (ref 3.5–5.3)
Sodium: 133 mEq/L — ABNORMAL LOW (ref 135–145)

## 2014-09-26 LAB — HEMOGLOBIN A1C
Hgb A1c MFr Bld: 7.6 % — ABNORMAL HIGH (ref <5.7)
MEAN PLASMA GLUCOSE: 171 mg/dL — AB (ref <117)

## 2014-09-26 LAB — GLUCOSE, POCT (MANUAL RESULT ENTRY): POC Glucose: 176 mg/dl — AB (ref 70–99)

## 2014-09-26 MED ORDER — ISOSORBIDE MONONITRATE ER 30 MG PO TB24: 30.0000 mg | ORAL_TABLET | Freq: Every day | ORAL | Status: DC

## 2014-09-26 MED ORDER — ALPRAZOLAM 0.5 MG PO TABS: 0.2500 mg | ORAL_TABLET | Freq: Two times a day (BID) | ORAL | Status: DC | PRN

## 2014-09-26 NOTE — Addendum Note (Signed)
Addended by: Kem Boroughs D on: 09/26/2014 12:17 PM   Modules accepted: Orders

## 2014-09-26 NOTE — Addendum Note (Signed)
Addended by: Arlyss Queen A on: 09/26/2014 12:15 PM   Modules accepted: Orders

## 2014-09-26 NOTE — Progress Notes (Signed)
Subjective:  This chart was scribe for Jason Russian, MD by Judithann Sauger, ED Scribe. The patient was seen in Room 23 and the patient's care was started at 11:42 AM.    Patient ID: Jason [name], male    DOB: [birth year], 64 y.o.   MRN: [id number] Chief Complaint  Patient presents with  . Diabetes  . Hyperlipidemia  . Hypertension  . Depression  . Medication Refill    xanax, isosorbide, zoloft    HPI HPI Comments: Jason [name] is a 64 y.o. male with a hx of DM, HLD, HTN, and Depression who presents to the [facility] for medication refill. He reports associated fatigue and he talked to the Pharmacist about one of his medication that decreases heart rate. He denies walking outside but adds that he walks inside his house. He also c/o ear pain. He states that he used an ear candle but needs to re-use it about 3-4 days later. He reports that he smokes a little over a pack a day. He reports that he visits his Cardiologist once every 6 months. He denies having a Paediatric nurse.   Past Medical History  Diagnosis Date  . Coronary artery disease     CABG 2/11, cath x 4 since - med Rx  . Hypertension     type 2 NIDDM  . Diabetes mellitus   . Arthritis   . GERD (gastroesophageal reflux disease)   . High cholesterol   . Myocardial infarct     x4 last 10 yrs  . Anxiety     off xanax  and paxil since 3/13  . COPD (chronic obstructive pulmonary disease)   . Pneumonia     hx  . Cancer     tumor basal cell rem from lft arm  . Smoker   . 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 Depression   . Substance abuse   . Cataract   . Heart attack    Past Surgical History  Procedure Laterality Date  . Femoral artery - femoral artery bypass graft Right 2004    femoral enarterectomy  . US extremity*l*      lft arm tumor removed   . Cardiac catheterization  4/12    Medical Rx  . Coronary angioplasty  Jan 2004    RCA  . Back  surgery  Jan 2014, Dec 2011    Dr Vertell Limber  . Coronary artery bypass graft  07/24/2009    L-LAD, SVG-RI/OM, SVG-PDA  . Eye surgery      cat bil  . Coronary angiogram  01/17/14    med rx  . Coronary angiogram  4/13    med Rx  . Coronary angiogram  1/15    Med Rx  . Left heart catheterization with coronary angiogram N/A 10/03/2011    Procedure: LEFT HEART CATHETERIZATION WITH CORONARY ANGIOGRAM;  Surgeon: Pixie Casino, MD;  Location: [facility] CATH LAB;  Service: Cardiovascular;  Laterality: N/A;  . Left heart catheterization with coronary angiogram N/A 07/08/2013    Procedure: LEFT HEART CATHETERIZATION WITH CORONARY ANGIOGRAM;  Surgeon: Blane Ohara, MD;  Location: [facility] CATH LAB;  Service: Cardiovascular;  Laterality: N/A;  . Left heart catheterization with coronary/graft angiogram N/A 01/17/2014    Procedure: LEFT HEART CATHETERIZATION WITH Beatrix Fetters;  Surgeon: Troy Sine, MD;  Location: [facility] CATH LAB;  Service: Cardiovascular;  Laterality: N/A;   Allergies  Allergen Reactions  . Coreg [Carvedilol]  Nausea And Vomiting and Other (See Comments)    Per patient made him dizzy and light sensitive  . Sulfa Antibiotics Nausea And Vomiting and Other (See Comments)    Also headaches       Review of Systems     Objective:   Physical Exam  Vitals reviewed.  CONSTITUTIONAL: Well developed/well nourished HEAD: Normocephalic/atraumatic EYES: EOMI/PERRL ENMT: Mucous membranes moist NECK: supple no meningeal signs. Bilateral bruits.  SPINE/BACK:entire spine nontender CV: S1/S2 noted, no murmurs/rubs/gallops noted LUNGS: Lungs are clear to auscultation bilaterally, no apparent distress ABDOMEN: soft, nontender, no rebound or guarding, bowel sounds noted throughout abdomen GU:no cva tenderness NEURO: Pt is awake/alert/appropriate, moves all extremitiesx4.  No facial droop.   EXTREMITIES: pulses normal/equal, full ROM SKIN: warm, color normal the patient has multiple actinic  keratoses over his for head and face. There is one lesion on the forehead and one on the left side of the face which may be early superficial squamous cells. On the right side of the nose is a three-quarter centimeter basal cell carcinoma. PSYCH: no abnormalities of mood noted, alert and oriented to situation        Assessment & Plan:  Patient is trying to get disability through [employer] in [place]. He has very limited income at the present time. He cannot afford to see the dermatologist at this time. He has significant carotid bruits with a history of carotid stenosis. He has not had any recent carotid studies. He has been able to remain off of cocaine. He cannot afford any pneumococcal vaccines at the present time. Hopefully if he is able to get disability we can proceed with updating his immunizations. He definitely needs referral to dermatology once we are able to.I personally performed the services described in this documentation, which was scribed in my presence. The recorded information has been reviewed and is accurate.

## 2014-09-28 ENCOUNTER — Other Ambulatory Visit: Payer: Self-pay | Admitting: Emergency Medicine

## 2014-09-28 DIAGNOSIS — R0989 Other specified symptoms and signs involving the circulatory and respiratory systems: Secondary | ICD-10-CM

## 2014-10-19 ENCOUNTER — Other Ambulatory Visit: Payer: Self-pay | Admitting: Family Medicine

## 2014-11-15 ENCOUNTER — Other Ambulatory Visit: Payer: Self-pay | Admitting: Physician Assistant

## 2014-11-21 ENCOUNTER — Other Ambulatory Visit: Payer: Self-pay | Admitting: Emergency Medicine

## 2014-12-06 ENCOUNTER — Ambulatory Visit (INDEPENDENT_AMBULATORY_CARE_PROVIDER_SITE_OTHER): Payer: Self-pay | Admitting: Emergency Medicine

## 2014-12-06 ENCOUNTER — Ambulatory Visit (INDEPENDENT_AMBULATORY_CARE_PROVIDER_SITE_OTHER): Payer: Self-pay

## 2014-12-06 VITALS — BP 138/60 | HR 73 | Temp 97.8°F | Resp 17 | Ht 69.0 in | Wt 169.4 lb

## 2014-12-06 DIAGNOSIS — F419 Anxiety disorder, unspecified: Secondary | ICD-10-CM

## 2014-12-06 DIAGNOSIS — F32A Depression, unspecified: Secondary | ICD-10-CM

## 2014-12-06 DIAGNOSIS — M549 Dorsalgia, unspecified: Secondary | ICD-10-CM

## 2014-12-06 DIAGNOSIS — F418 Other specified anxiety disorders: Secondary | ICD-10-CM

## 2014-12-06 DIAGNOSIS — F329 Major depressive disorder, single episode, unspecified: Secondary | ICD-10-CM

## 2014-12-06 MED ORDER — ALPRAZOLAM 0.5 MG PO TABS: 0.2500 mg | ORAL_TABLET | Freq: Two times a day (BID) | ORAL | Status: DC | PRN

## 2014-12-06 MED ORDER — SERTRALINE HCL 50 MG PO TABS: ORAL_TABLET | ORAL | Status: DC
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
## 2014-12-06 NOTE — Patient Instructions (Signed)
Please do not make any cleaning chemicals while working. These can create a bad mixture and cause significant respiratory problem

## 2015-01-02 ENCOUNTER — Other Ambulatory Visit: Payer: Self-pay | Admitting: Emergency Medicine

## 2015-01-23 ENCOUNTER — Telehealth: Payer: Self-pay | Admitting: Emergency Medicine

## 2015-01-23 ENCOUNTER — Other Ambulatory Visit: Payer: Self-pay | Admitting: Cardiology

## 2015-01-23 NOTE — Telephone Encounter (Signed)
REFILL 

## 2015-01-23 NOTE — Telephone Encounter (Signed)
Mr. Denault stopped by saying he's not been able to afford Ranolazine 500MG  that was prescribed by his Cardiologist. He's out of the medication and said he needs to take it everyday for his heart. He also stated he wouldn't be able to afford to go to his Cardiologist for a while so he's wondering if Dr. Everlene Farrier would be able to write him a Rx for the medication. He'd like a phone call back regarding this.  Pt ph# [phone] Thank you.

## 2015-01-24 NOTE — Telephone Encounter (Signed)
I believe this has already  been sent in for him. Please called to verify he received the prescription.

## 2015-01-25 NOTE — Telephone Encounter (Signed)
Called pharmacy it was recently filled.

## 2015-02-01 ENCOUNTER — Other Ambulatory Visit: Payer: Self-pay | Admitting: Physician Assistant

## 2015-02-07 ENCOUNTER — Encounter: Payer: Self-pay | Admitting: Cardiovascular Disease

## 2015-02-07 ENCOUNTER — Ambulatory Visit (INDEPENDENT_AMBULATORY_CARE_PROVIDER_SITE_OTHER): Payer: Self-pay | Admitting: Cardiovascular Disease

## 2015-02-07 VITALS — BP 136/64 | HR 78 | Ht 69.0 in | Wt 171.6 lb

## 2015-02-07 DIAGNOSIS — Z720 Tobacco use: Secondary | ICD-10-CM

## 2015-02-07 DIAGNOSIS — I6521 Occlusion and stenosis of right carotid artery: Secondary | ICD-10-CM

## 2015-02-07 DIAGNOSIS — R0989 Other specified symptoms and signs involving the circulatory and respiratory systems: Secondary | ICD-10-CM

## 2015-02-07 DIAGNOSIS — E785 Hyperlipidemia, unspecified: Secondary | ICD-10-CM

## 2015-02-07 DIAGNOSIS — Z79899 Other long term (current) drug therapy: Secondary | ICD-10-CM

## 2015-02-07 DIAGNOSIS — I10 Essential (primary) hypertension: Secondary | ICD-10-CM

## 2015-02-07 DIAGNOSIS — I209 Angina pectoris, unspecified: Secondary | ICD-10-CM

## 2015-02-07 DIAGNOSIS — I25810 Atherosclerosis of coronary artery bypass graft(s) without angina pectoris: Secondary | ICD-10-CM

## 2015-02-07 MED ORDER — ISOSORBIDE MONONITRATE ER 60 MG PO TB24: 60.0000 mg | ORAL_TABLET | Freq: Every day | ORAL | Status: DC

## 2015-02-07 MED ORDER — METOPROLOL TARTRATE 25 MG PO TABS: 12.5000 mg | ORAL_TABLET | Freq: Two times a day (BID) | ORAL | Status: DC

## 2015-02-07 MED ORDER — RANOLAZINE ER 1000 MG PO TB12: 1000.0000 mg | ORAL_TABLET | Freq: Two times a day (BID) | ORAL | Status: DC

## 2015-02-07 MED ORDER — ASPIRIN EC 81 MG PO TBEC: 81.0000 mg | DELAYED_RELEASE_TABLET | Freq: Every day | ORAL | Status: DC

## 2015-02-07 MED ORDER — ATORVASTATIN CALCIUM 40 MG PO TABS: 40.0000 mg | ORAL_TABLET | Freq: Every day | ORAL | Status: DC

## 2015-02-07 NOTE — Patient Instructions (Addendum)
Your physician has requested that you have a carotid duplex. This test is an ultrasound of the carotid arteries in your neck. It looks at blood flow through these arteries that supply the brain with blood. Allow one hour for this exam. There are no restrictions or special instructions.  Your physician recommends that you return for lab work fasting.   Your physician has recommended you make the following change in your medication:   1.) increase the Ranexa to 1000 mg twice daily.  2.) increase the isosorbide to 60 mg daily. Take (2) of your 30 mg tablets until gone then use prescription provided to you today.  3.) decrease the aspirin to 81 mg daily.  4.) start new prescription for  Metoprolol tartrate. Take as directed on the bottle . This has already been sent to your pharmacy.  5.) the atorvastatin has been decreased to 40 mg daily. Cut the 80 mg tablets into half and take 1/2 tablet daily. Once prescription completed, use new 40 mg prescription provided to you today.  Your physician recommends that you schedule a follow-up appointment in: 2-3 months with [name].

## 2015-02-09 ENCOUNTER — Encounter: Payer: Self-pay | Admitting: Cardiovascular Disease

## 2015-02-09 NOTE — Progress Notes (Signed)
Patient ID: Jason [name], male   DOB: 10/17/1950, 64 y.o.   MRN: [id number]    Primary M.D.: Dr. Arlyss Queen  HPI: Jason [name] is a 64 y.o. male who presents to the office today for a  follow up cardiology evaluation.  Jason [name] has a history of CAD and in 2001 underwent CABG revascularization surgery.  He has a history of hypertension, diabetes mellitus, COPD, and carotid disease.  In January 2015 he underwent cardiac catheterization which revealed a patent LIMA graft to his LAD, a patent sequential graft to marginal vessels, and an occluded sequential graft which previously had supplied the PDA and PLA vessels of the RCA and there was evidence for mild left-to-right collateralization.  In July 2015.  He was admitted with recurrent anginal symptomatology.  Repeat catheterization revealed mild LV dysfunction with mid to basal inferior hypocontractility.  Ejection fraction was 50%.  He had significant native CAD with coronary calcification, 70% ostial and distal left main stenoses, 50% stenosis in the first diagonal branch was LAD, 80% mid LAD stenosis, and occluded high marginal/ramus intermediate vessel, 80% AV groove left circumflex stenosis and total proximal RCA occlusion.  He is a patent LIMA to the LAD, a patent sequential graft to a high marginal, a known 2 vessel in the vein graft to its previously supplied the PDA and PLA vessel remained occluded.  Increased medical therapy was recommended.  Recently, he has begun to notice more episodes of chest pain.  He has been on Ranexa 500 mg twice a day, isosorbide 30 mg, and aspirin.  He is diabetic on Glucophage.  He has been on atorvastatin 80 mg.  He also has noticed increasing shortness of breath.  He admits to occasional cramping of his legs.  He notes some intermittent dizziness.  He presents for cardiology evaluation.  Past Medical History  Diagnosis Date  . Coronary artery disease     CABG 2/11, cath x 4 since - med Rx  .  Hypertension     type 2 NIDDM  . Diabetes mellitus   . Arthritis   . GERD (gastroesophageal reflux disease)   . High cholesterol   . Myocardial infarct     x4 last 10 yrs  . Anxiety     off xanax  and paxil since 3/13  . COPD (chronic obstructive pulmonary disease)   . Pneumonia     hx  . Cancer     tumor basal cell rem from lft arm  . Smoker   . 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 Depression   . Substance abuse   . Cataract   . Heart attack     Past Surgical History  Procedure Laterality Date  . Femoral artery - femoral artery bypass graft Right 2004    femoral enarterectomy  . US extremity*l*      lft arm tumor removed   . Cardiac catheterization  4/12    Medical Rx  . Coronary angioplasty  Jan 2004    RCA  . Back surgery  Jan 2014, Dec 2011    Dr Vertell Limber  . Coronary artery bypass graft  07/24/2009    L-LAD, SVG-RI/OM, SVG-PDA  . Eye surgery      cat bil  . Coronary angiogram  01/17/14    med rx  . Coronary angiogram  4/13    med Rx  . Coronary angiogram  1/15    Med Rx  . Left heart  catheterization with coronary angiogram N/A 10/03/2011    Procedure: LEFT HEART CATHETERIZATION WITH CORONARY ANGIOGRAM;  Surgeon: Pixie Casino, MD;  Location: [facility] CATH LAB;  Service: Cardiovascular;  Laterality: N/A;  . Left heart catheterization with coronary angiogram N/A 07/08/2013    Procedure: LEFT HEART CATHETERIZATION WITH CORONARY ANGIOGRAM;  Surgeon: Blane Ohara, MD;  Location: [facility] CATH LAB;  Service: Cardiovascular;  Laterality: N/A;  . Left heart catheterization with coronary/graft angiogram N/A 01/17/2014    Procedure: LEFT HEART CATHETERIZATION WITH Beatrix Fetters;  Surgeon: Troy Sine, MD;  Location: [facility] CATH LAB;  Service: Cardiovascular;  Laterality: N/A;    Allergies  Allergen Reactions  . Coreg [Carvedilol] Nausea And Vomiting and Other (See Comments)    Per patient made him dizzy and light sensitive  . Sulfa  Antibiotics Nausea And Vomiting and Other (See Comments)    Also headaches     Current Outpatient Prescriptions  Medication Sig Dispense Refill  . acetaminophen (TYLENOL) 325 MG tablet Take 2 tablets (650 mg total) by mouth every 4 (four) hours as needed for headache or mild pain.    Marland Kitchen ALPRAZolam (XANAX) 0.5 MG tablet Take 0.5-1 tablets (0.25-0.5 mg total) by mouth 2 (two) times daily as needed for anxiety. Or panic episode. 60 tablet 3  . lisinopril (PRINIVIL,ZESTRIL) 10 MG tablet TAKE 1 TABLET BY MOUTH EVERY DAY (NEEDS CHECK UP FOR ADDITIONAL REFILLS) 30 tablet 4  . loratadine (CLARITIN) 10 MG tablet Take 10 mg by mouth daily.    . metFORMIN (GLUCOPHAGE) 500 MG tablet TAKE 1 TABLET BY MOUTH TWICE A DAY (Patient taking differently: take 1 tab daily as needed) 60 tablet 1  . naproxen sodium (ANAPROX) 220 MG tablet Take 440 mg by mouth 2 (two) times daily as needed (for pain).    . nitroGLYCERIN (NITROSTAT) 0.4 MG SL tablet Place 1 tablet (0.4 mg total) under the tongue every 5 (five) minutes as needed for chest pain. 25 tablet 2  . ranitidine (ZANTAC) 150 MG tablet Take 150 mg by mouth daily as needed for heartburn. For acid reflux    . sertraline (ZOLOFT) 50 MG tablet TAKE 1 TABLET BY MOUTH EVERY DAY 30 tablet 3  . aspirin EC 81 MG tablet Take 1 tablet (81 mg total) by mouth daily. 90 tablet 3  . atorvastatin (LIPITOR) 40 MG tablet Take 1 tablet (40 mg total) by mouth daily. 30 tablet 6  . isosorbide mononitrate (IMDUR) 60 MG 24 hr tablet Take 1 tablet (60 mg total) by mouth daily. 90 tablet 3  . metoprolol tartrate (LOPRESSOR) 25 MG tablet Take 0.5 tablets (12.5 mg total) by mouth 2 (two) times daily. 30 tablet 6  . ranolazine (RANEXA) 1000 MG SR tablet Take 1 tablet (1,000 mg total) by mouth 2 (two) times daily. 56 tablet 0  . ranolazine (RANEXA) 1000 MG SR tablet Take 1 tablet (1,000 mg total) by mouth 2 (two) times daily. 60 tablet 6   No current facility-administered medications for this  visit.    Social History   Social History  . Marital Status: Divorced    Spouse Name: N/A  . Number of Children: N/A  . Years of Education: N/A   Occupational History  . welder-fabricator    Social History Main Topics  . Smoking status: Current Every Day Smoker -- 1.00 packs/day for 40 years    Types: Cigarettes  . Smokeless tobacco: Never Used  . Alcohol Use: Yes     Comment: socially  .  Drug Use: No  . Sexual Activity: Not Currently   Other Topics Concern  . Not on file   Social History Narrative   Exercise walking 4 times per week for 1 mile    Family History  Problem Relation Age of Onset  . Arthritis Mother   . Heart disease Father     ROS General: Negative; No fevers, chills, or night sweats HEENT: Negative; No changes in vision or hearing, sinus congestion, difficulty swallowing Pulmonary: Negative; No cough, wheezing, shortness of breath, hemoptysis Cardiovascular: See HPI:  GI: Negative; No nausea, vomiting, diarrhea, or abdominal pain GU: Negative; No dysuria, hematuria, or difficulty voiding Musculoskeletal: Occasional muscle cramps. Hematologic: Negative; no easy bruising, bleeding Endocrine: Negative; no heat/cold intolerance; no diabetes, Neuro: Negative; no changes in balance, headaches Skin: Negative; No rashes or skin lesions Psychiatric: Negative; No behavioral problems, depression Sleep: Negative; No snoring,  daytime sleepiness, hypersomnolence, bruxism, restless legs, hypnogognic hallucinations. Other comprehensive 14 point system review is negative   Physical Exam BP 136/64 mmHg  Pulse 78  Ht $R'5\' 9"'lj$  (1.753 m)  Wt 171 lb 9 oz (77.82 kg)  BMI 25.32 kg/m2 Wt Readings from Last 3 Encounters:  02/07/15 171 lb 9 oz (77.82 kg)  12/06/14 169 lb 6.4 oz (76.839 kg)  09/26/14 167 lb 12.8 oz (76.114 kg)   General: Alert, oriented, no distress.  Skin: normal turgor, no rashes, warm and dry HEENT: Normocephalic, atraumatic. Pupils equal round  and reactive to light; sclera anicteric; extraocular muscles intact, No lid lag; Nose without nasal septal hypertrophy; Mouth/Parynx benign; Mallinpatti scale 3 Neck: No JVD, right carotid bruit; normal carotid upstroke Lungs: clear to ausculatation and percussion bilaterally; no wheezing or rales, normal inspiratory and expiratory effort Chest wall: without tenderness to palpitation Heart: PMI not displaced, RRR, s1 s2 normal, 1/6 systolic murmur, No diastolic murmur, no rubs, gallops, thrills, or heaves Abdomen: soft, nontender; no hepatosplenomehaly, BS+; abdominal aorta nontender and not dilated by palpation. Back: no CVA tenderness Pulses: 2+ , soft femoral bruits Musculoskeletal: full range of motion, normal strength, no joint deformities Extremities: Pulses 2+, no clubbing cyanosis or edema, Homan's sign negative  Neurologic: grossly nonfocal; Cranial nerves grossly wnl Psychologic: Normal mood and affect   ECG (independently read by me): Normal sinus rhythm at 78 bpm.  Right bundle branch block with repolarization changes.  LABS:  BMP Latest Ref Rng 09/26/2014 01/17/2014 01/16/2014  Glucose 70 - 99 mg/dL 181(H) 139(H) 143(H)  BUN 6 - 23 mg/dL $Remove'13 10 10  'xmeLDKI$ Creatinine 0.50 - 1.35 mg/dL 1.14 1.02 1.09  Sodium 135 - 145 mEq/L 133(L) 134(L) 128(L)  Potassium 3.5 - 5.3 mEq/L 5.0 4.6 4.2  Chloride 96 - 112 mEq/L 98 98 91(L)  CO2 19 - 32 mEq/L $Remove'22 23 19  'BFVBvBs$ Calcium 8.4 - 10.5 mg/dL 9.3 9.4 9.4     Hepatic Function Latest Ref Rng 10/03/2011 07/24/2009 07/22/2009  Total Protein 6.0 - 8.3 g/dL 5.8(L) 5.6(L) 6.5  Albumin 3.5 - 5.2 g/dL 3.2(L) 3.2(L) 3.7  AST 0 - 37 U/L $Remo'15 18 23  'vvBal$ ALT 0 - 53 U/L $Remo'19 22 26  'Vduie$ Alk Phosphatase 39 - 117 U/L 101 74 85  Total Bilirubin 0.3 - 1.2 mg/dL 0.1(L) 0.8 0.5    CBC Latest Ref Rng 01/17/2014 01/16/2014 11/18/2013  WBC 4.0 - 10.5 K/uL 10.8(H) 14.0(H) 12.3(A)  Hemoglobin 13.0 - 17.0 g/dL 14.7 15.5 16.3  Hematocrit 39.0 - 52.0 % 43.0 44.8 48.4  Platelets 150 - 400 K/uL  208 223 -  Lab Results  Component Value Date   MCV 92.7 01/17/2014   MCV 94.3 01/16/2014   MCV 95.3 11/18/2013    Lab Results  Component Value Date   TSH 3.542 09/26/2014    BNP No results found for: BNP  ProBNP    Component Value Date/Time   PROBNP 77.9 07/07/2013 2033     Lipid Panel     Component Value Date/Time   CHOL 178 01/17/2014 0355   TRIG 245* 01/17/2014 0355   HDL 36* 01/17/2014 0355   CHOLHDL 4.9 01/17/2014 0355   VLDL 49* 01/17/2014 0355   LDLCALC 93 01/17/2014 0355     RADIOLOGY: No results found.    ASSESSMENT AND PLAN: Jason [name] is a 64 year old male who underwent CABG revascularization surgery in 2001.  He has documented occlusion of the vein graft which previously had supplied the PDA and PLA branches of his RCA.  His cardiac catheterization were reviewed.  He has significant native CAD and continued to have a patent LIMA graft as well as a patent sequential graft supplying a ramus intermediate, like vessel and OM 2 vessel.  Presently, he is having increasing anginal symptomatology.  He has class II to class III symptoms.  I am recommending further titration of his Ranexa to 1000 mg twice a day and am further titrating his isosorbide mononitrate to 60 mg daily.  I am adding low-dose beta blocker therapy with metoprolol, tartrate 12.5 mg twice a day.  Because he is on Ranexa the current recommendation is for the maximum dose of atorvastatin to be 40 mg and I will reduce this.  I also have recommended he reduce his aspirin to 81 mg.  He does have a loud right carotid bruit.  I am scheduling him for carotid duplex imaging.  We discussed the importance of complete smoking cessation.  Follow-up laboratory will be obtained in  2 months and I will see him back in the office for further evaluation.   Time spent: 40 minutes  Troy Sine, MD, [facility]  02/09/2015 10:16 AM

## 2015-02-13 ENCOUNTER — Ambulatory Visit: Payer: Self-pay | Admitting: Emergency Medicine

## 2015-02-15 ENCOUNTER — Encounter: Payer: Self-pay | Admitting: Emergency Medicine

## 2015-02-15 ENCOUNTER — Ambulatory Visit (INDEPENDENT_AMBULATORY_CARE_PROVIDER_SITE_OTHER): Payer: Self-pay | Admitting: Emergency Medicine

## 2015-02-15 VITALS — BP 101/63 | HR 79 | Temp 97.3°F | Resp 16 | Wt 166.0 lb

## 2015-02-15 DIAGNOSIS — F329 Major depressive disorder, single episode, unspecified: Secondary | ICD-10-CM

## 2015-02-15 DIAGNOSIS — E1165 Type 2 diabetes mellitus with hyperglycemia: Secondary | ICD-10-CM

## 2015-02-15 DIAGNOSIS — F418 Other specified anxiety disorders: Secondary | ICD-10-CM

## 2015-02-15 DIAGNOSIS — IMO0002 Reserved for concepts with insufficient information to code with codable children: Secondary | ICD-10-CM

## 2015-02-15 DIAGNOSIS — F419 Anxiety disorder, unspecified: Secondary | ICD-10-CM

## 2015-02-15 LAB — POCT GLYCOSYLATED HEMOGLOBIN (HGB A1C): Hemoglobin A1C: 6.3

## 2015-02-15 MED ORDER — SERTRALINE HCL 50 MG PO TABS: 50.0000 mg | ORAL_TABLET | Freq: Every day | ORAL | Status: DC

## 2015-02-15 MED ORDER — ALPRAZOLAM 0.5 MG PO TABS: 0.2500 mg | ORAL_TABLET | Freq: Two times a day (BID) | ORAL | Status: DC | PRN

## 2015-02-15 NOTE — Progress Notes (Addendum)
Patient ID: Jason [name], male   DOB: 08/28/1950, 64 y.o.   MRN: [id number]    This chart was scribed for Nena Jordan, MD by [facility], medical scribe at [facility].The patient was seen in exam room 21 and the patient's care was started at 1:15 PM.  Chief Complaint:   Chief Complaint  Patient presents with  . medication check    xanax, zoloft   HPI: Jason [name] is a 64 y.o. male who reports to [facility] today to refill his xanax and zoloft. Taking 0.5-1 mg xanax daily, admits to taking more due to stress with IRS and insurance.   CAD: Seen by [name], cardiology because of chest pain and fatigue. His ranexa and nitroglycerin was increased. Feeling better this week. Smokes 1 p.p.d.   Diabetes: Several days blood sugar running in the 70's, today blood sugar was 150's. Taking 500 mg metformin daily.   Dermatology: Has an appointment to September 7 th to remove skin cancers on his face.  Past Medical History  Diagnosis Date  . Coronary artery disease     CABG 2/11, cath x 4 since - med Rx  . Hypertension     type 2 NIDDM  . Diabetes mellitus   . Arthritis   . GERD (gastroesophageal reflux disease)   . High cholesterol   . Myocardial infarct     x4 last 10 yrs  . Anxiety     off xanax  and paxil since 3/13  . COPD (chronic obstructive pulmonary disease)   . Pneumonia     hx  . Cancer     tumor basal cell rem from lft arm  . Smoker   . 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 Depression   . Substance abuse   . Cataract   . Heart attack    Past Surgical History  Procedure Laterality Date  . Femoral artery - femoral artery bypass graft Right 2004    femoral enarterectomy  . US extremity*l*      lft arm tumor removed   . Cardiac catheterization  4/12    Medical Rx  . Coronary angioplasty  Jan 2004    RCA  . Back surgery  Jan 2014, Dec 2011    Dr Vertell Limber  . Coronary artery bypass graft  07/24/2009   L-LAD, SVG-RI/OM, SVG-PDA  . Eye surgery      cat bil  . Coronary angiogram  01/17/14    med rx  . Coronary angiogram  4/13    med Rx  . Coronary angiogram  1/15    Med Rx  . Left heart catheterization with coronary angiogram N/A 10/03/2011    Procedure: LEFT HEART CATHETERIZATION WITH CORONARY ANGIOGRAM;  Surgeon: Pixie Casino, MD;  Location: [facility] CATH LAB;  Service: Cardiovascular;  Laterality: N/A;  . Left heart catheterization with coronary angiogram N/A 07/08/2013    Procedure: LEFT HEART CATHETERIZATION WITH CORONARY ANGIOGRAM;  Surgeon: Blane Ohara, MD;  Location: [facility] CATH LAB;  Service: Cardiovascular;  Laterality: N/A;  . Left heart catheterization with coronary/graft angiogram N/A 01/17/2014    Procedure: LEFT HEART CATHETERIZATION WITH Beatrix Fetters;  Surgeon: Troy Sine, MD;  Location: [facility] CATH LAB;  Service: Cardiovascular;  Laterality: N/A;   Social History   Social History  . Marital Status: Divorced    Spouse Name: N/A  . Number of Children: N/A  . Years of Education: N/A  Occupational History  . welder-fabricator    Social History Main Topics  . Smoking status: Current Every Day Smoker -- 1.00 packs/day for 40 years    Types: Cigarettes  . Smokeless tobacco: Never Used  . Alcohol Use: Yes     Comment: socially  . Drug Use: No  . Sexual Activity: Not Currently   Other Topics Concern  . None   Social History Narrative   Exercise walking 4 times per week for 1 mile   Family History  Problem Relation Age of Onset  . Arthritis Mother   . Heart disease Father    Allergies  Allergen Reactions  . Coreg [Carvedilol] Nausea And Vomiting and Other (See Comments)    Per patient made him dizzy and light sensitive  . Sulfa Antibiotics Nausea And Vomiting and Other (See Comments)    Also headaches    Prior to Admission medications   Medication Sig Start Date End Date Taking? Authorizing Provider  acetaminophen (TYLENOL) 325 MG tablet Take 2  tablets (650 mg total) by mouth every 4 (four) hours as needed for headache or mild pain. 01/18/14   Erlene Quan, PA-C  ALPRAZolam Duanne Moron) 0.5 MG tablet Take 0.5-1 tablets (0.25-0.5 mg total) by mouth 2 (two) times daily as needed for anxiety. Or panic episode. 12/06/14   [name], MD  aspirin EC 81 MG tablet Take 1 tablet (81 mg total) by mouth daily. 02/07/15   Troy Sine, MD  atorvastatin (LIPITOR) 40 MG tablet Take 1 tablet (40 mg total) by mouth daily. 02/07/15   Troy Sine, MD  isosorbide mononitrate (IMDUR) 60 MG 24 hr tablet Take 1 tablet (60 mg total) by mouth daily. 02/07/15   Troy Sine, MD  lisinopril (PRINIVIL,ZESTRIL) 10 MG tablet TAKE 1 TABLET BY MOUTH EVERY DAY (NEEDS CHECK UP FOR ADDITIONAL REFILLS) 08/22/14   [name], MD  loratadine (CLARITIN) 10 MG tablet Take 10 mg by mouth daily.    Historical Provider, MD  metFORMIN (GLUCOPHAGE) 500 MG tablet TAKE 1 TABLET BY MOUTH TWICE A DAY Patient taking differently: take 1 tab daily as needed 11/22/14   [name], MD  metoprolol tartrate (LOPRESSOR) 25 MG tablet Take 0.5 tablets (12.5 mg total) by mouth 2 (two) times daily. 02/07/15   Troy Sine, MD  naproxen sodium (ANAPROX) 220 MG tablet Take 440 mg by mouth 2 (two) times daily as needed (for pain).    Historical Provider, MD  nitroGLYCERIN (NITROSTAT) 0.4 MG SL tablet Place 1 tablet (0.4 mg total) under the tongue every 5 (five) minutes as needed for chest pain. 01/18/14   Erlene Quan, PA-C  ranitidine (ZANTAC) 150 MG tablet Take 150 mg by mouth daily as needed for heartburn. For acid reflux    Historical Provider, MD  ranolazine (RANEXA) 1000 MG SR tablet Take 1 tablet (1,000 mg total) by mouth 2 (two) times daily. 02/07/15   Troy Sine, MD  ranolazine (RANEXA) 1000 MG SR tablet Take 1 tablet (1,000 mg total) by mouth 2 (two) times daily. 02/07/15   Troy Sine, MD  sertraline (ZOLOFT) 50 MG tablet TAKE 1 TABLET BY MOUTH EVERY DAY 02/02/15   [name], MD      ROS: The patient denies fevers, chills, night sweats, unintentional weight loss, chest pain, palpitations, wheezing, dyspnea on exertion, nausea, vomiting, abdominal pain, dysuria, hematuria, melena, numbness, weakness, or tingling.  All other systems have been reviewed and were otherwise negative  with the exception of those mentioned in the HPI and as above.    PHYSICAL EXAM: Filed Vitals:   02/15/15 1300  BP: 101/63  Pulse: 79  Temp: 97.3 F (36.3 C)  Resp: 16   Body mass index is 24.5 kg/(m^2).  General: Alert, no acute distress HEENT:  Normocephalic, atraumatic, oropharynx patent. Poor dentition.  Eye: [facility] Cardiovascular:  Regular rate and rhythm, no rubs murmurs or gallops. Loud right Carotid bruit, radial pulse intact. No pedal edema.  Respiratory: decreased breath sounds in the bases.  No wheezes, rales, or rhonchi.  No cyanosis, no use of accessory musculature Abdominal: No organomegaly, abdomen is soft and non-tender, positive bowel sounds.  No masses. Musculoskeletal: Gait intact. No edema, tenderness Skin: 3/4 cm basal cell carcinoma right side of the nose. Neurologic: Facial musculature symmetric. Psychiatric: Patient acts appropriately throughout our interaction. Lymphatic: No cervical or submandibular lymphadenopathy  LABS: Results for orders placed or performed in visit on 02/15/15  POCT glycosylated hemoglobin (Hb A1C)  Result Value Ref Range   Hemoglobin A1C 6.3    ASSESSMENT/PLAN: Diabetes is under great control. He is going to go ahead and get his carotid Doppler done. He does have a significant bruit but could not afford the previously ordered Doppler. He also has what appears to be a skin cancer on the right side of his nose and hopefully when his Medicare gets approved we can get this removed. We'll recheck in 3-4 months. Meds were refilled. Both ears were wax occluded and they were irrigated. Gross sideeffects, risk and benefits, and  alternatives of medications d/w patient. Patient is aware that all medications have potential sideeffects and we are unable to predict every sideeffect or drug-drug interaction that may occur.    Arlyss Queen MD 02/15/2015 1:15 PM

## 2015-02-18 ENCOUNTER — Other Ambulatory Visit: Payer: Self-pay | Admitting: Emergency Medicine

## 2015-02-21 ENCOUNTER — Other Ambulatory Visit: Payer: Self-pay | Admitting: Emergency Medicine

## 2015-02-21 DIAGNOSIS — R0989 Other specified symptoms and signs involving the circulatory and respiratory systems: Secondary | ICD-10-CM

## 2015-02-28 ENCOUNTER — Telehealth: Payer: Self-pay | Admitting: *Deleted

## 2015-02-28 ENCOUNTER — Ambulatory Visit (HOSPITAL_COMMUNITY)
Admission: RE | Admit: 2015-02-28 | Discharge: 2015-02-28 | Disposition: A | Discharge status: Home / Self Care | Payer: Self-pay | Source: Ambulatory Visit | Attending: Urology | Admitting: Urology

## 2015-02-28 DIAGNOSIS — R0989 Other specified symptoms and signs involving the circulatory and respiratory systems: Secondary | ICD-10-CM | POA: Insufficient documentation

## 2015-02-28 DIAGNOSIS — E785 Hyperlipidemia, unspecified: Secondary | ICD-10-CM | POA: Insufficient documentation

## 2015-02-28 DIAGNOSIS — I10 Essential (primary) hypertension: Secondary | ICD-10-CM | POA: Insufficient documentation

## 2015-02-28 DIAGNOSIS — I6523 Occlusion and stenosis of bilateral carotid arteries: Secondary | ICD-10-CM | POA: Insufficient documentation

## 2015-02-28 DIAGNOSIS — F17200 Nicotine dependence, unspecified, uncomplicated: Secondary | ICD-10-CM | POA: Insufficient documentation

## 2015-02-28 DIAGNOSIS — E119 Type 2 diabetes mellitus without complications: Secondary | ICD-10-CM | POA: Insufficient documentation

## 2015-02-28 DIAGNOSIS — I2510 Atherosclerotic heart disease of native coronary artery without angina pectoris: Secondary | ICD-10-CM | POA: Insufficient documentation

## 2015-02-28 NOTE — Telephone Encounter (Signed)
Medication samples have been provided to the patient.  Drug name: ranexa 1000  Qty: 56  LOT: [id number]  Exp.Date: 09/2017  Patient walked in and requested samples.   Sheral Apley M 2:46 PM 02/28/2015

## 2015-03-01 ENCOUNTER — Telehealth: Payer: Self-pay | Admitting: Cardiovascular Disease

## 2015-03-01 ENCOUNTER — Telehealth: Payer: Self-pay | Admitting: Emergency Medicine

## 2015-03-01 NOTE — Telephone Encounter (Signed)
I call patient and advised him if he had any neurological symptoms of weakness or numbness or speech problems to call 911 or head straight to the emergency room. Patient understands.

## 2015-03-01 NOTE — Telephone Encounter (Signed)
Dr Everlene Farrier wants you to call him today,concerning Jason [name] please.

## 2015-03-06 ENCOUNTER — Encounter: Payer: Self-pay | Admitting: Cardiovascular Disease

## 2015-03-06 ENCOUNTER — Telehealth: Payer: Self-pay

## 2015-03-06 ENCOUNTER — Ambulatory Visit (INDEPENDENT_AMBULATORY_CARE_PROVIDER_SITE_OTHER): Payer: Self-pay | Admitting: Cardiovascular Disease

## 2015-03-06 VITALS — BP 128/64 | HR 76 | Ht 69.0 in | Wt 171.0 lb

## 2015-03-06 DIAGNOSIS — Z01812 Encounter for preprocedural laboratory examination: Secondary | ICD-10-CM

## 2015-03-06 DIAGNOSIS — I6523 Occlusion and stenosis of bilateral carotid arteries: Secondary | ICD-10-CM

## 2015-03-06 MED ORDER — CLOPIDOGREL BISULFATE 75 MG PO TABS: 75.0000 mg | ORAL_TABLET | Freq: Every day | ORAL | Status: DC

## 2015-03-06 NOTE — Patient Instructions (Addendum)
Medication Instructions:  Your physician recommends that you continue on your current medications as directed. Please refer to the Current Medication list given to you today.   Labwork: NONE  Testing/Procedures: NONE  Follow-Up: Dr. Kennon Holter office will call you to schedule follow-up or potential procedure.   Patient called back and scheduled Carotid Angiogram.  [name] has ordered a Carotid Angiogram to be done at [facility].  This procedure is going to look at the bloodflow in your lower extremities.  If [name] is able to open up the arteries, you will have to spend one night in the hospital.  If he is not able to open the arteries, you will be able to go home that same day.  SCHEDULE WITH DR. Concord  After the procedure, you will not be allowed to drive for 3 days or push, pull, or lift anything greater than 10 lbs for one week.    You will be required to have the following tests prior to the procedure:  1. Blood work-the blood work can be done no more than 7 days prior to the procedure.  It can be done at any [facility] lab.  There is one downstairs on the first floor of this building and one in the [facility] building [phone] [address], [address]). The lab can be found on the FIRST FLOOR of out building in [address]   2. Chest Xray-the chest xray order has already been placed at the [place].     *REPS: LOUIS  Puncture site: Right Groin

## 2015-03-06 NOTE — Progress Notes (Addendum)
03/06/2015 Jason [name]   May 06, 1951  [id number]  Primary Physician [name], [name], MD Primary Cardiologist: Lorretta Harp MD [name]   HPI:  Jason [name] is a 64 year old thin appearing single Caucasian male father of 36 year old daughter, grandfather to 90 year old granddaughter who was referred to me by [name] for peripheral vascular evaluation because of high-grade asymptomatic bilateral carotid artery stenosis. He has a history of ischemic heart disease status post coronary artery bypass grafting in 2011. He had multiple stents prior to that. His other problems include history of continued tobacco abuse, diabetes, hypertension and hyperlipidemia. He has had continued chest pain requiring titration of his anti-anginal medications with a known occluded sequential vein graft to the PDA and PLA. His carotid Dopplers performed on 02/28/15 revealed an occluded left internal carotid artery and high-grade right ICA stenosis.   Current Outpatient Prescriptions  Medication Sig Dispense Refill  . acetaminophen (TYLENOL) 325 MG tablet Take 2 tablets (650 mg total) by mouth every 4 (four) hours as needed for headache or mild pain.    Marland Kitchen ALPRAZolam (XANAX) 0.5 MG tablet Take 0.5-1 tablets (0.25-0.5 mg total) by mouth 2 (two) times daily as needed for anxiety. Or panic episode. 60 tablet 3  . aspirin EC 81 MG tablet Take 1 tablet (81 mg total) by mouth daily. 90 tablet 3  . atorvastatin (LIPITOR) 40 MG tablet Take 1 tablet (40 mg total) by mouth daily. 30 tablet 6  . isosorbide mononitrate (IMDUR) 60 MG 24 hr tablet Take 1 tablet (60 mg total) by mouth daily. 90 tablet 3  . lisinopril (PRINIVIL,ZESTRIL) 10 MG tablet TAKE 1 TABLET BY MOUTH EVERY DAY 30 tablet 2  . loratadine (CLARITIN) 10 MG tablet Take 10 mg by mouth daily.    . metFORMIN (GLUCOPHAGE) 500 MG tablet TAKE 1 TABLET BY MOUTH TWICE A DAY (Patient taking differently: take 1 tab daily as needed) 60 tablet 1  .  metoprolol tartrate (LOPRESSOR) 25 MG tablet Take 0.5 tablets (12.5 mg total) by mouth 2 (two) times daily. 30 tablet 6  . naproxen sodium (ANAPROX) 220 MG tablet Take 440 mg by mouth 2 (two) times daily as needed (for pain).    . nitroGLYCERIN (NITROSTAT) 0.4 MG SL tablet Place 1 tablet (0.4 mg total) under the tongue every 5 (five) minutes as needed for chest pain. 25 tablet 2  . ranitidine (ZANTAC) 150 MG tablet Take 150 mg by mouth daily as needed for heartburn. For acid reflux    . ranolazine (RANEXA) 1000 MG SR tablet Take 1 tablet (1,000 mg total) by mouth 2 (two) times daily. 60 tablet 6  . sertraline (ZOLOFT) 50 MG tablet Take 1 tablet (50 mg total) by mouth daily. 30 tablet 11   No current facility-administered medications for this visit.    Allergies  Allergen Reactions  . Coreg [Carvedilol] Nausea And Vomiting and Other (See Comments)    Per patient made him dizzy and light sensitive  . Sulfa Antibiotics Nausea And Vomiting and Other (See Comments)    Also headaches     Social History   Social History  . Marital Status: Divorced    Spouse Name: N/A  . Number of Children: N/A  . Years of Education: N/A   Occupational History  . welder-fabricator    Social History Main Topics  . Smoking status: Current Every Day Smoker -- 1.00 packs/day for 40 years    Types: Cigarettes  . Smokeless tobacco: Never Used  .  Alcohol Use: Yes     Comment: socially  . Drug Use: No  . Sexual Activity: Not Currently   Other Topics Concern  . Not on file   Social History Narrative   Exercise walking 4 times per week for 1 mile     Review of Systems: General: negative for chills, fever, night sweats or weight changes.  Cardiovascular: negative for chest pain, dyspnea on exertion, edema, orthopnea, palpitations, paroxysmal nocturnal dyspnea or shortness of breath Dermatological: negative for rash Respiratory: negative for cough or wheezing Urologic: negative for  hematuria Abdominal: negative for nausea, vomiting, diarrhea, bright red blood per rectum, melena, or hematemesis Neurologic: negative for visual changes, syncope, or dizziness All other systems reviewed and are otherwise negative except as noted above.    Blood pressure 128/64, pulse 76, height 5\' 9"  (1.753 m), weight 171 lb (77.565 kg).  General appearance: alert and no distress Neck: no adenopathy, no JVD, supple, symmetrical, trachea midline, thyroid not enlarged, symmetric, no tenderness/mass/nodules and bilateral carotid bruits right lateral left Lungs: clear to auscultation bilaterally Heart: regular rate and rhythm, S1, S2 normal, no murmur, click, rub or gallop Extremities: extremities normal, atraumatic, no cyanosis or edema Pulses: 2+ and symmetric Skin: Skin color, texture, turgor normal. No rashes or lesions Neurologic: Grossly normal  EKG not performed today  ASSESSMENT AND PLAN:   Carotid stenosis- moderate 2011 Jason [name] was referred to me by [name] for evaluation and treatment of asymptomatic carotid artery disease. He is a long history of ischemic heart disease.[name] recently saw him in the office and auscultated bilateral bruits. Carotid Dopplers were performed on 02/28/15 revealing an occluded left internal carotid artery and high-grade right ICA stenosis. He is neurologically symptomatically.  I believe he would be high risk for carotid endarterectomy based on his carotid anatomy as well as his coronary artery disease and ongoing chest pain. I suspect he would be a more appropriate candidate for carotid artery stenting though this would be high risk as well. I discussed this with the patient who agrees to proceed with a revascularization procedure. I will discuss this with Dr. Trula Slade , vascular surgeon, prior to proceeding.   I reviewed clinical situation with Dr. Trula Slade and he agrees that given the patient's anatomy, history of CAD and chest pain he would be  more appropriate for carotid artery stenting. I will begin Plavix 75 mg by mouth daily and will arrange for him to undergo angiography and potential carotid artery stenting in the next several weeks.   Lorretta Harp MD FACP,FACC,FAHA, [facility] 03/06/2015 12:30 PM

## 2015-03-06 NOTE — Addendum Note (Signed)
Addended by: Newt Minion on: 03/06/2015 05:35 PM   Modules accepted: Orders

## 2015-03-06 NOTE — Telephone Encounter (Signed)
Pt made aware that scheduling will be in contact with you to setup a carotid angio and to start plavix 75 mg po qd.

## 2015-03-06 NOTE — Progress Notes (Signed)
   Patient ID: Jason [name], male    DOB: Dec 17, 1950, 64 y.o.   MRN: [id number]  HPI    Review of Systems    Physical Exam

## 2015-03-06 NOTE — Assessment & Plan Note (Signed)
Mr. Jason [name] was referred to me by [name] for evaluation and treatment of asymptomatic carotid artery disease. He is a long history of ischemic heart disease.[name] recently saw him in the office and auscultated bilateral bruits. Carotid Dopplers were performed on 02/28/15 revealing an occluded left internal carotid artery and high-grade right ICA stenosis. He is neurologically symptomatically.  I believe he would be high risk for carotid endarterectomy based on his carotid anatomy as well as his coronary artery disease and ongoing chest pain. I suspect he would be a more appropriate candidate for carotid artery stenting though this would be high risk as well. I discussed this with the patient who agrees to proceed with a revascularization procedure. I will discuss this with Dr. Trula Slade , vascular surgeon, prior to proceeding.

## 2015-03-07 ENCOUNTER — Telehealth: Payer: Self-pay | Admitting: *Deleted

## 2015-03-07 ENCOUNTER — Encounter: Payer: Self-pay | Admitting: Cardiovascular Disease

## 2015-03-07 NOTE — Telephone Encounter (Signed)
I spoke with this patient regarding appointment for Carotid Angiogram--Poss Carotid stent.  This is scheduled for Tuesday 03/13/15 @ 7:30 am with [name] and Dr. Darvin Neighbours at [facility] @ 5:30 am NPO after midnight--No Metformin after Sunday 03/11/15.  Be sure to continue Plavix and ASA.  Have labs done Thursday 03/08/15.  Patient voiced his understanding.  I also spoke with [name]--he is aware of procedure date and time.

## 2015-03-09 LAB — PROTIME-INR
INR: 0.97 (ref <1.50)
Prothrombin Time: 13 seconds (ref 11.6–15.2)

## 2015-03-09 LAB — APTT: aPTT: 33 seconds (ref 24–37)

## 2015-03-10 ENCOUNTER — Ambulatory Visit (INDEPENDENT_AMBULATORY_CARE_PROVIDER_SITE_OTHER): Payer: Self-pay | Admitting: Emergency Medicine

## 2015-03-10 ENCOUNTER — Ambulatory Visit (INDEPENDENT_AMBULATORY_CARE_PROVIDER_SITE_OTHER): Payer: Self-pay

## 2015-03-10 VITALS — BP 122/64 | HR 81 | Temp 97.7°F | Resp 14 | Ht 69.0 in | Wt 168.0 lb

## 2015-03-10 DIAGNOSIS — R0989 Other specified symptoms and signs involving the circulatory and respiratory systems: Secondary | ICD-10-CM

## 2015-03-10 DIAGNOSIS — F17200 Nicotine dependence, unspecified, uncomplicated: Secondary | ICD-10-CM

## 2015-03-10 DIAGNOSIS — E871 Hypo-osmolality and hyponatremia: Secondary | ICD-10-CM

## 2015-03-10 DIAGNOSIS — Z720 Tobacco use: Secondary | ICD-10-CM

## 2015-03-10 LAB — CBC WITH DIFFERENTIAL/PLATELET
BASOS PCT: 0 % (ref 0–1)
Basophils Absolute: 0 10*3/uL (ref 0.0–0.1)
Eosinophils Absolute: 0.4 10*3/uL (ref 0.0–0.7)
Eosinophils Relative: 3 % (ref 0–5)
HCT: 41 % (ref 39.0–52.0)
HEMOGLOBIN: 14.3 g/dL (ref 13.0–17.0)
Lymphocytes Relative: 33 % (ref 12–46)
Lymphs Abs: 4.9 10*3/uL — ABNORMAL HIGH (ref 0.7–4.0)
MCH: 33.8 pg (ref 26.0–34.0)
MCHC: 34.9 g/dL (ref 30.0–36.0)
MCV: 96.9 fL (ref 78.0–100.0)
MONOS PCT: 9 % (ref 3–12)
MPV: 8.4 fL — ABNORMAL LOW (ref 8.6–12.4)
Monocytes Absolute: 1.3 10*3/uL — ABNORMAL HIGH (ref 0.1–1.0)
NEUTROS ABS: 8.2 10*3/uL — AB (ref 1.7–7.7)
NEUTROS PCT: 55 % (ref 43–77)
Platelets: 268 10*3/uL (ref 150–400)
RBC: 4.23 MIL/uL (ref 4.22–5.81)
RDW: 13.3 % (ref 11.5–15.5)
WBC: 14.9 10*3/uL — AB (ref 4.0–10.5)

## 2015-03-10 LAB — BASIC METABOLIC PANEL
BUN: 23 mg/dL (ref 7–25)
CO2: 22 mmol/L (ref 20–31)
Calcium: 9.7 mg/dL (ref 8.6–10.3)
Chloride: 92 mmol/L — ABNORMAL LOW (ref 98–110)
Creat: 1.41 mg/dL — ABNORMAL HIGH (ref 0.70–1.25)
GLUCOSE: 98 mg/dL (ref 65–99)
POTASSIUM: 5.2 mmol/L (ref 3.5–5.3)
SODIUM: 125 mmol/L — AB (ref 135–146)

## 2015-03-10 LAB — BASIC METABOLIC PANEL WITH GFR
BUN: 18 mg/dL (ref 7–25)
CHLORIDE: 92 mmol/L — AB (ref 98–110)
CO2: 22 mmol/L (ref 20–31)
CREATININE: 1.43 mg/dL — AB (ref 0.70–1.25)
Calcium: 9.3 mg/dL (ref 8.6–10.3)
GFR, Est African American: 60 mL/min (ref >=60)
GFR, Est Non African American: 52 mL/min — ABNORMAL LOW (ref >=60)
Glucose, Bld: 174 mg/dL — ABNORMAL HIGH (ref 65–99)
Potassium: 4.7 mmol/L (ref 3.5–5.3)
SODIUM: 126 mmol/L — AB (ref 135–146)

## 2015-03-10 LAB — OSMOLALITY: Osmolality: 273 mOsm/kg — ABNORMAL LOW (ref 275–300)

## 2015-03-10 LAB — OSMOLALITY, URINE: OSMOLALITY UR: 280 mosm/kg — AB (ref 390–1090)

## 2015-03-10 NOTE — Progress Notes (Addendum)
Patient ID: Jason [name], male   DOB: [birth year], 64 y.o.   MRN: [id number]    This chart was scribed for [name], MD by Ladene Artist, ED Scribe. The patient was seen in room 5. Patient's care was started at 10:47 AM.  Chief Complaint:  Chief Complaint  Patient presents with  . Pre-op Exam    Requesting a chest X-ray   HPI: Jason [name] is a 64 y.o. male, with a h/o coronary artery disease, bilateral carotid artery disease, HTN, DM, COPD, who reports to [facility] today for a pre-op CXR. Pt has a blocked carotid artery; he had blood work done yesterday and is scheduled to have surgery on 03/13/15 by cardiologist Quay Burow, MD. Pt states that he has cut back on tobacco use by alternating with vapor cigarettes.   Past Medical History  Diagnosis Date  . Coronary artery disease     CABG 2/11, cath x 4 since - med Rx  . Hypertension     type 2 NIDDM  . Diabetes mellitus   . Arthritis   . GERD (gastroesophageal reflux disease)   . High cholesterol   . Myocardial infarct     x4 last 10 yrs  . Anxiety     off xanax  and paxil since 3/13  . COPD (chronic obstructive pulmonary disease)   . Pneumonia     hx  . Cancer     tumor basal cell rem from lft arm  . Smoker   . 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 Depression   . Substance abuse   . Cataract   . Heart attack   . Bilateral carotid artery disease    Past Surgical History  Procedure Laterality Date  . Femoral artery - femoral artery bypass graft Right 2004    femoral enarterectomy  . US extremity*l*      lft arm tumor removed   . Cardiac catheterization  4/12    Medical Rx  . Coronary angioplasty  Jan 2004    RCA  . Back surgery  Jan 2014, Dec 2011    Dr Vertell Limber  . Coronary artery bypass graft  07/24/2009    L-LAD, SVG-RI/OM, SVG-PDA  . Eye surgery      cat bil  . Coronary angiogram  01/17/14    med rx  . Coronary angiogram  4/13    med Rx  . Coronary angiogram  1/15     Med Rx  . Left heart catheterization with coronary angiogram N/A 10/03/2011    Procedure: LEFT HEART CATHETERIZATION WITH CORONARY ANGIOGRAM;  Surgeon: Pixie Casino, MD;  Location: [facility] CATH LAB;  Service: Cardiovascular;  Laterality: N/A;  . Left heart catheterization with coronary angiogram N/A 07/08/2013    Procedure: LEFT HEART CATHETERIZATION WITH CORONARY ANGIOGRAM;  Surgeon: Blane Ohara, MD;  Location: [facility] CATH LAB;  Service: Cardiovascular;  Laterality: N/A;  . Left heart catheterization with coronary/graft angiogram N/A 01/17/2014    Procedure: LEFT HEART CATHETERIZATION WITH Beatrix Fetters;  Surgeon: Troy Sine, MD;  Location: [facility] CATH LAB;  Service: Cardiovascular;  Laterality: N/A;   Social History   Social History  . Marital Status: Divorced    Spouse Name: N/A  . Number of Children: N/A  . Years of Education: N/A   Occupational History  . welder-fabricator    Social History Main Topics  . Smoking status: Current Every Day Smoker -- 1.00 packs/day for 40 years  Types: Cigarettes  . Smokeless tobacco: Never Used  . Alcohol Use: Yes     Comment: socially  . Drug Use: No  . Sexual Activity: Not Currently   Other Topics Concern  . None   Social History Narrative   Exercise walking 4 times per week for 1 mile   Family History  Problem Relation Age of Onset  . Arthritis Mother   . Heart disease Father    Allergies  Allergen Reactions  . Coreg [Carvedilol] Nausea And Vomiting and Other (See Comments)    Per patient made him dizzy and light sensitive  . Sulfa Antibiotics Nausea And Vomiting and Other (See Comments)    Also headaches    Prior to Admission medications   Medication Sig Start Date End Date Taking? Authorizing Provider  acetaminophen (TYLENOL) 325 MG tablet Take 2 tablets (650 mg total) by mouth every 4 (four) hours as needed for headache or mild pain. 01/18/14  Yes Luke K Kilroy, PA-C  ALPRAZolam Duanne Moron) 0.5 MG tablet Take 0.5-1  tablets (0.25-0.5 mg total) by mouth 2 (two) times daily as needed for anxiety. Or panic episode. 02/15/15  Yes [name], MD  aspirin EC 81 MG tablet Take 1 tablet (81 mg total) by mouth daily. 02/07/15  Yes Troy Sine, MD  atorvastatin (LIPITOR) 40 MG tablet Take 1 tablet (40 mg total) by mouth daily. 02/07/15  Yes Troy Sine, MD  clopidogrel (PLAVIX) 75 MG tablet Take 1 tablet (75 mg total) by mouth daily. 03/06/15  Yes Lorretta Harp, MD  isosorbide mononitrate (IMDUR) 60 MG 24 hr tablet Take 1 tablet (60 mg total) by mouth daily. 02/07/15  Yes Troy Sine, MD  lisinopril (PRINIVIL,ZESTRIL) 10 MG tablet TAKE 1 TABLET BY MOUTH EVERY DAY 02/19/15  Yes Chelle Jeffery, PA-C  loratadine (CLARITIN) 10 MG tablet Take 10 mg by mouth daily.   Yes Historical Provider, MD  metFORMIN (GLUCOPHAGE) 500 MG tablet TAKE 1 TABLET BY MOUTH TWICE A DAY Patient taking differently: take 1 tab daily as needed 11/22/14  Yes [name], MD  metoprolol tartrate (LOPRESSOR) 25 MG tablet Take 0.5 tablets (12.5 mg total) by mouth 2 (two) times daily. 02/07/15  Yes Troy Sine, MD  naproxen sodium (ANAPROX) 220 MG tablet Take 440 mg by mouth 2 (two) times daily as needed (for pain).   Yes Historical Provider, MD  nitroGLYCERIN (NITROSTAT) 0.4 MG SL tablet Place 1 tablet (0.4 mg total) under the tongue every 5 (five) minutes as needed for chest pain. 01/18/14  Yes Luke K Kilroy, PA-C  ranitidine (ZANTAC) 150 MG tablet Take 150 mg by mouth daily as needed for heartburn. For acid reflux   Yes Historical Provider, MD  ranolazine (RANEXA) 1000 MG SR tablet Take 1 tablet (1,000 mg total) by mouth 2 (two) times daily. 02/07/15  Yes Troy Sine, MD  sertraline (ZOLOFT) 50 MG tablet Take 1 tablet (50 mg total) by mouth daily. 02/15/15  Yes [name], MD     ROS: The patient denies fevers, chills, night sweats, unintentional weight loss, chest pain, palpitations, wheezing, dyspnea on exertion, nausea, vomiting,  abdominal pain, dysuria, hematuria, melena, numbness, weakness, or tingling.   All other systems have been reviewed and were otherwise negative with the exception of those mentioned in the HPI and as above.    PHYSICAL EXAM: Filed Vitals:   03/10/15 1037  BP: 122/64  Pulse: 81  Temp: 97.7 F (36.5 C)  Resp:  14   Body mass index is 24.8 kg/(m^2).  General: Alert, no acute distress HEENT:  Normocephalic, atraumatic, oropharynx patent. Eye: Juliette Mangle [facility] Cardiovascular:  Regular rate and rhythm, no rubs murmurs or gallops.  Loud R carotid bruit. Radial pulse intact. No pedal edema.  Respiratory: Bilateral rhonchi. No wheezes or rales. No cyanosis, no use of accessory musculature Abdominal: No organomegaly, abdomen is soft and non-tender, positive bowel sounds.  No masses. Musculoskeletal: Gait intact. No edema, tenderness Skin: No rashes. Neurologic: Facial musculature symmetric. Psychiatric: Patient acts appropriately throughout our interaction. Lymphatic: No cervical or submandibular lymphadenopathy Genitourinary/Anorectal: No acute findings  LABS: Results for orders placed or performed in visit on 03/06/15  CBC with Differential/Platelet  Result Value Ref Range   WBC 14.9 (H) 4.0 - 10.5 K/uL   RBC 4.23 4.22 - 5.81 MIL/uL   Hemoglobin 14.3 13.0 - 17.0 g/dL   HCT 41.0 39.0 - 52.0 %   MCV 96.9 78.0 - 100.0 fL   MCH 33.8 26.0 - 34.0 pg   MCHC 34.9 30.0 - 36.0 g/dL   RDW 13.3 11.5 - 15.5 %   Platelets 268 150 - 400 K/uL   MPV 8.4 (L) 8.6 - 12.4 fL   Neutrophils Relative % 55 43 - 77 %   Neutro Abs 8.2 (H) 1.7 - 7.7 K/uL   Lymphocytes Relative 33 12 - 46 %   Lymphs Abs 4.9 (H) 0.7 - 4.0 K/uL   Monocytes Relative 9 3 - 12 %   Monocytes Absolute 1.3 (H) 0.1 - 1.0 K/uL   Eosinophils Relative 3 0 - 5 %   Eosinophils Absolute 0.4 0.0 - 0.7 K/uL   Basophils Relative 0 0 - 1 %   Basophils Absolute 0.0 0.0 - 0.1 K/uL   Smear Review Criteria for review not met   Basic metabolic  panel  Result Value Ref Range   Sodium 125 (L) 135 - 146 mmol/L   Potassium 5.2 3.5 - 5.3 mmol/L   Chloride 92 (L) 98 - 110 mmol/L   CO2 22 20 - 31 mmol/L   Glucose, Bld 98 65 - 99 mg/dL   BUN 23 7 - 25 mg/dL   Creat 1.41 (H) 0.70 - 1.25 mg/dL   Calcium 9.7 8.6 - 10.3 mg/dL  Protime-INR  Result Value Ref Range   Prothrombin Time 13.0 11.6 - 15.2 seconds   INR 0.97 <1.50  APTT  Result Value Ref Range   aPTT 33 24 - 37 seconds    EKG/XRAY:   Primary read interpreted by Dr. Everlene Farrier at [facility]. Status post coronary artery bypass graft. Fields appear clear there is evidence of COPD.   ASSESSMENT/PLAN: Chest x-ray was done preop. He does have a sodium of 125. He has been low in the past. I do not know if this is related to his COPD or whether he could have SIADH related to his SSRI. I did order a serum osmolality as well as urine osmolality. And repeated his serum sodium. Patient has severe carotid disease. He is scheduled for surgery on Tuesday with [name]. Preop chest x-ray reveals no acute disease.   Gross sideeffects, risk and benefits, and alternatives of medications d/w patient. Patient is aware that all medications have potential sideeffects and we are unable to predict every sideeffect or drug-drug interaction that may occur.  Arlyss Queen MD 03/10/2015 10:47 AM

## 2015-03-10 NOTE — Patient Instructions (Signed)
Carotid stenosisCarotid Artery Disease The carotid arteries are the two main arteries on either side of the neck that supply blood to the brain. Carotid artery disease, also called carotid artery stenosis, is the narrowing or blockage of one or both carotid arteries. Carotid artery disease increases your risk for a stroke or a transient ischemic attack (TIA). A TIA is an episode in which a waxy, fatty substance that accumulates within the artery (plaque) blocks blood flow to the brain. A TIA is considered a "warning stroke."  CAUSES   Buildup of plaque inside the carotid arteries (atherosclerosis) (common).  A weakened outpouching in an artery (aneurysm).  Inflammation of the carotid artery (arteritis).  A fibrous growth within the carotid artery (fibromuscular dysplasia).  Tissue death within the carotid artery due to radiation treatment (post-radiation necrosis).  Decreased blood flow due to spasms of the carotid artery (vasospasm).  Separation of the walls of the carotid artery (carotid dissection). RISK FACTORS  High cholesterol (dyslipidemia).   High blood pressure (hypertension).   Smoking.   Obesity.   Diabetes.   Family history of cardiovascular disease.   Inactivity or lack of regular exercise.   Being male. Men have an increased risk of developing atherosclerosis earlier in life than women.  SYMPTOMS  Carotid artery disease does not cause symptoms. DIAGNOSIS Diagnosis of carotid artery disease may include:   A physical exam. Your health care provider may hear an abnormal sound (bruit) when listening to the carotid arteries.   Specific tests that look at the blood flow in the carotid arteries. These tests include:   Carotid artery ultrasonography.   Carotid or cerebral angiography.   Computerized tomographic angiography (CTA).   Magnetic resonance angiography (MRA).  TREATMENT  Treatment of carotid artery disease can include a combination of  treatments. Treatment options include:  Surgery. You may have:   A carotid endarterectomy. This is a surgery to remove the blockages in the carotid arteries.   A carotid angioplasty with stenting. This is a nonsurgical interventional procedure. A wire mesh (stent) is used to widen the blocked carotid arteries.   Medicines to control blood pressure, cholesterol, and reduce blood clotting (antiplatelet therapy).   Adjusting your diet.   Lifestyle changes such as:   Quitting smoking.   Exercising as tolerated or as directed by your health care provider.   Controlling and maintaining a good blood pressure.   Keeping cholesterol levels under control.  HOME CARE INSTRUCTIONS   Take medicines only as directed by your health care provider. Make sure you understand all your medicine instructions. Do not stop your medicines without talking to your health care provider.   Follow your health care provider's diet instructions. It is important to eat a healthy diet that is low in saturated fats and includes plenty of fresh fruits, vegetables, and lean meats. High-fat, high-sodium foods as well as foods that are fried, overly processed, or have poor nutritional value should be avoided.  Maintain a healthy weight.   Stay physically active. It is recommended that you get at least 30 minutes of activity every day.   Do not use any tobacco products including cigarettes, chewing tobacco, or electronic cigarettes. If you need help quitting, ask your health care provider.  Limit alcohol use to:   No more than 2 drinks per day for men.   No more than 1 drink per day for nonpregnant women.   Do not use illegal drugs.   Keep all follow-up visits as directed by your  health care provider.  SEEK IMMEDIATE MEDICAL CARE IF:  You develop TIA or stroke symptoms. These include:   Sudden weakness or numbness on one side of the body, such as in the face, arm, or leg.   Sudden  confusion.   Trouble speaking (aphasia) or understanding.   Sudden trouble seeing out of one or both eyes.   Sudden trouble walking.   Dizziness or feeling like you might faint.   Loss of balance or coordination.   Sudden severe headache with no known cause.   Sudden trouble swallowing (dysphagia).  If you have any of these symptoms, call your local emergency services (911 in [place] Do not drive yourself to the clinic or hospital. This is a medical emergency.  Document Released: 09/01/2011 Document Revised: 10/24/2013 Document Reviewed: 12/08/2012 [facility] Patient Information 2015 [place], [place]. This information is not intended to replace advice given to you by your health care provider. Make sure you discuss any questions you have with your health care provider. Hyponatremia  Hyponatremia is when the amount of salt (sodium) in your blood is too low. When sodium levels are low, your cells will absorb extra water and swell. The swelling happens throughout the body, but it mostly affects the brain. Severe brain swelling (cerebral edema), seizures, or coma can happen.  CAUSES   Heart, kidney, or liver problems.  Thyroid problems.  Adrenal gland problems.  Severe vomiting and diarrhea.  Certain medicines or illegal drugs.  Dehydration.  Drinking too much water.  Low-sodium diet. SYMPTOMS   Nausea and vomiting.  Confusion.  Lethargy.  Agitation.  Headache.  Twitching or shaking (seizures).  Unconsciousness.  Appetite loss.  Muscle weakness and cramping. DIAGNOSIS  Hyponatremia is identified by a simple blood test. Your caregiver will perform a history and physical exam to try to find the cause and type of hyponatremia. Other tests may be needed to measure the amount of sodium in your blood and urine. TREATMENT  Treatment will depend on the cause.   Fluids may be given through the vein (IV).  Medicines may be used to correct the sodium imbalance. If  medicines are causing the problem, they will need to be adjusted.  Water or fluid intake may be restricted to restore proper balance. The speed of correcting the sodium problem is very important. If the problem is corrected too fast, nerve damage (sometimes unchangeable) can happen. HOME CARE INSTRUCTIONS   Only take medicines as directed by your caregiver. Many medicines can make hyponatremia worse. Discuss all your medicines with your caregiver.  Carefully follow any recommended diet, including any fluid restrictions.  You may be asked to repeat lab tests. Follow these directions.  Avoid alcohol and recreational drugs. SEEK MEDICAL CARE IF:   You develop worsening nausea, fatigue, headache, confusion, or weakness.  Your original hyponatremia symptoms return.  You have problems following the recommended diet. SEEK IMMEDIATE MEDICAL CARE IF:   You have a seizure.  You faint.  You have ongoing diarrhea or vomiting. MAKE SURE YOU:   Understand these instructions.  Will watch your condition.  Will get help right away if you are not doing well or get worse. Document Released: 05/30/2002 Document Revised: 09/01/2011 Document Reviewed: 11/24/2010 [facility] Patient Information 2015 [place], [place]. This information is not intended to replace advice given to you by your health care provider. Make sure you discuss any questions you have with your health care provider.

## 2015-03-12 ENCOUNTER — Telehealth: Payer: Self-pay | Admitting: *Deleted

## 2015-03-12 ENCOUNTER — Telehealth: Payer: Self-pay | Admitting: Cardiovascular Disease

## 2015-03-12 DIAGNOSIS — Z79899 Other long term (current) drug therapy: Secondary | ICD-10-CM

## 2015-03-12 DIAGNOSIS — IMO0002 Reserved for concepts with insufficient information to code with codable children: Secondary | ICD-10-CM

## 2015-03-12 DIAGNOSIS — R7989 Other specified abnormal findings of blood chemistry: Secondary | ICD-10-CM

## 2015-03-12 LAB — BASIC METABOLIC PANEL
BUN: 17 mg/dL (ref 7–25)
CALCIUM: 9.7 mg/dL (ref 8.6–10.3)
CHLORIDE: 91 mmol/L — AB (ref 98–110)
CO2: 23 mmol/L (ref 20–31)
CREATININE: 1.36 mg/dL — AB (ref 0.70–1.25)
Glucose, Bld: 116 mg/dL — ABNORMAL HIGH (ref 65–99)
Potassium: 5.4 mmol/L — ABNORMAL HIGH (ref 3.5–5.3)
Sodium: 126 mmol/L — ABNORMAL LOW (ref 135–146)

## 2015-03-12 NOTE — Telephone Encounter (Signed)
Jason [name] has stat lab for the pt

## 2015-03-12 NOTE — Telephone Encounter (Signed)
Pt came in office this morning and states that he got his lab work done this morning and wanted to let you know. Thanks

## 2015-03-12 NOTE — Telephone Encounter (Signed)
Per Zigmund Daniel and [name] - if results are not in my 3pm on 9/19 will need to cancel procedure for 9/20

## 2015-03-12 NOTE — Telephone Encounter (Signed)
Jason [name] is returning a call. Please call

## 2015-03-12 NOTE — Telephone Encounter (Signed)
Spoke with patient and communicated current situation regarding his lab results. Explained the need to recheck labs as [name] wants to make sure his kidney function is OK for procedure. He voiced understanding of this. He will have lab work in the [employer] in our building. He states he had a "spell" last nite and wants the procedure done ASAP.

## 2015-03-12 NOTE — Telephone Encounter (Signed)
Per Satira Sark & [name], patient needs to have STAT BUN/Creatinine TODAY in anticipation for carotid angiogram tomorrow. Recent creatinine has been elevated (03/09/15 Creat 1.41, 03/10/15 Creat 1.43).   LM for patient to call back regarding this.   [name] will need to be notified of STAT lab results to make decision if to proceed with case/admit for hydration?   CC K. Herbert Deaner, RN

## 2015-03-12 NOTE — Telephone Encounter (Signed)
Zigmund Daniel notified patient that procedure was cancelled.   Called patient and informed him that since we do not have labs in it is not safe to proceed with angiogram involving dye that could put undue stress on his kidneys and the STAT labs that were ordered have not resulted.   Informed patient that we will be in contact with him to set up the procedure at a later date. He voiced understanding.

## 2015-03-12 NOTE — Telephone Encounter (Signed)
STAT labs in EPIC. Routed to MD/Kim, RN

## 2015-03-12 NOTE — Telephone Encounter (Signed)
LMTCB  Per [name], procedure for 9/20 needs to be cancelled.

## 2015-03-12 NOTE — Telephone Encounter (Addendum)
Disregard previous documentation.  Did not intent to route to B. Samara Snide, [place] customer service - labs were not received until around 2:15pm for processing

## 2015-03-13 ENCOUNTER — Telehealth: Payer: Self-pay | Admitting: Cardiovascular Disease

## 2015-03-13 ENCOUNTER — Ambulatory Visit (HOSPITAL_COMMUNITY): Admission: RE | Admit: 2015-03-13 | Payer: MEDICAID | Source: Ambulatory Visit | Admitting: Cardiovascular Disease

## 2015-03-13 ENCOUNTER — Other Ambulatory Visit: Payer: Self-pay | Admitting: *Deleted

## 2015-03-13 ENCOUNTER — Telehealth: Payer: Self-pay | Admitting: Emergency Medicine

## 2015-03-13 ENCOUNTER — Encounter (HOSPITAL_COMMUNITY): Admission: RE | Payer: Self-pay | Source: Ambulatory Visit

## 2015-03-13 DIAGNOSIS — R748 Abnormal levels of other serum enzymes: Secondary | ICD-10-CM

## 2015-03-13 SURGERY — CAROTID PTA/STENT INTERVENTION

## 2015-03-13 NOTE — Telephone Encounter (Signed)
Labs resulted and pt to come back by the office this week for repeat bmp.

## 2015-03-13 NOTE — Telephone Encounter (Signed)
I discussed case with [name]. He is concerned about the low sodium and elevated

## 2015-03-13 NOTE — Telephone Encounter (Signed)
I called and spoke with patient he is going to stop his lisinopril he is going to stop his sertraline. He will have a repeat basic metabolic panel on Friday. This case was discussed with [name].

## 2015-03-15 NOTE — Telephone Encounter (Signed)
Has the pt been back in to have labs redone?

## 2015-03-16 NOTE — Telephone Encounter (Signed)
Dr. Everlene Farrier and [name] spoke to discuss plan of care for Jason [name].  Dr. Everlene Farrier going to make medication changes and reevaluate lab work.   Our office office will follow up at the end of September.

## 2015-03-17 LAB — BASIC METABOLIC PANEL
BUN: 14 mg/dL (ref 7–25)
CALCIUM: 9.4 mg/dL (ref 8.6–10.3)
CO2: 23 mmol/L (ref 20–31)
Chloride: 93 mmol/L — ABNORMAL LOW (ref 98–110)
Creat: 1.12 mg/dL (ref 0.70–1.25)
Glucose, Bld: 112 mg/dL — ABNORMAL HIGH (ref 65–99)
Potassium: 4.8 mmol/L (ref 3.5–5.3)
SODIUM: 125 mmol/L — AB (ref 135–146)

## 2015-03-18 ENCOUNTER — Ambulatory Visit (INDEPENDENT_AMBULATORY_CARE_PROVIDER_SITE_OTHER): Payer: Self-pay | Admitting: Emergency Medicine

## 2015-03-18 VITALS — BP 132/82 | HR 82 | Temp 97.4°F | Resp 16 | Ht 69.0 in | Wt 168.0 lb

## 2015-03-18 DIAGNOSIS — I6521 Occlusion and stenosis of right carotid artery: Secondary | ICD-10-CM

## 2015-03-18 NOTE — Progress Notes (Signed)
Patient ID: Jason [name], male   DOB: 11/21/1950, 64 y.o.   MRN: [id number]    This chart was scribed for Nena Jordan, MD by [facility], medical scribe at [facility].The patient was seen in exam room 07 and the patient's care was started at 3:33 PM.  Chief Complaint:  Chief Complaint  Patient presents with  . Surgical clarence   HPI: Jason [name] is a 64 y.o. male who reports to [facility] today for surgical clearance. Having a stent placed in his carotid gland. This was canceled due to a low sodium, and elevated creatinine. Repeat BMP 6 days ago showed a sodium of 126 and creatinine was 1.36. Still smoking.  Past Medical History  Diagnosis Date  . Coronary artery disease     CABG 2/11, cath x 4 since - med Rx  . Hypertension     type 2 NIDDM  . Diabetes mellitus   . Arthritis   . GERD (gastroesophageal reflux disease)   . High cholesterol   . Myocardial infarct     x4 last 10 yrs  . Anxiety     off xanax  and paxil since 3/13  . COPD (chronic obstructive pulmonary disease)   . Pneumonia     hx  . Cancer     tumor basal cell rem from lft arm  . Smoker   . 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 Depression   . Substance abuse   . Cataract   . Heart attack   . Bilateral carotid artery disease    Past Surgical History  Procedure Laterality Date  . Femoral artery - femoral artery bypass graft Right 2004    femoral enarterectomy  . US extremity*l*      lft arm tumor removed   . Cardiac catheterization  4/12    Medical Rx  . Coronary angioplasty  Jan 2004    RCA  . Back surgery  Jan 2014, Dec 2011    Dr Vertell Limber  . Coronary artery bypass graft  07/24/2009    L-LAD, SVG-RI/OM, SVG-PDA  . Eye surgery      cat bil  . Coronary angiogram  01/17/14    med rx  . Coronary angiogram  4/13    med Rx  . Coronary angiogram  1/15    Med Rx  . Left heart catheterization with coronary angiogram N/A 10/03/2011   Procedure: LEFT HEART CATHETERIZATION WITH CORONARY ANGIOGRAM;  Surgeon: Pixie Casino, MD;  Location: [facility] CATH LAB;  Service: Cardiovascular;  Laterality: N/A;  . Left heart catheterization with coronary angiogram N/A 07/08/2013    Procedure: LEFT HEART CATHETERIZATION WITH CORONARY ANGIOGRAM;  Surgeon: Blane Ohara, MD;  Location: [facility] CATH LAB;  Service: Cardiovascular;  Laterality: N/A;  . Left heart catheterization with coronary/graft angiogram N/A 01/17/2014    Procedure: LEFT HEART CATHETERIZATION WITH Beatrix Fetters;  Surgeon: Troy Sine, MD;  Location: [facility] CATH LAB;  Service: Cardiovascular;  Laterality: N/A;   Social History   Social History  . Marital Status: Divorced    Spouse Name: N/A  . Number of Children: N/A  . Years of Education: N/A   Occupational History  . welder-fabricator    Social History Main Topics  . Smoking status: Current Every Day Smoker -- 1.00 packs/day for 40 years    Types: Cigarettes  . Smokeless tobacco: Never Used  . Alcohol Use: Yes     Comment:  socially  . Drug Use: No  . Sexual Activity: Not Currently   Other Topics Concern  . None   Social History Narrative   Exercise walking 4 times per week for 1 mile   Family History  Problem Relation Age of Onset  . Arthritis Mother   . Heart disease Father    Allergies  Allergen Reactions  . Coreg [Carvedilol] Nausea And Vomiting and Other (See Comments)    Per patient made him dizzy and light sensitive  . Sulfa Antibiotics Nausea And Vomiting and Other (See Comments)    Also headaches    Prior to Admission medications   Medication Sig Start Date End Date Taking? Authorizing Provider  acetaminophen (TYLENOL) 325 MG tablet Take 2 tablets (650 mg total) by mouth every 4 (four) hours as needed for headache or mild pain. 01/18/14  Yes Luke K Kilroy, PA-C  ALPRAZolam Duanne Moron) 0.5 MG tablet Take 0.5-1 tablets (0.25-0.5 mg total) by mouth 2 (two) times daily as needed for anxiety. Or  panic episode. 02/15/15  Yes [name], MD  atorvastatin (LIPITOR) 40 MG tablet Take 1 tablet (40 mg total) by mouth daily. 02/07/15  Yes Troy Sine, MD  clopidogrel (PLAVIX) 75 MG tablet Take 1 tablet (75 mg total) by mouth daily. 03/06/15  Yes Lorretta Harp, MD  isosorbide mononitrate (IMDUR) 60 MG 24 hr tablet Take 1 tablet (60 mg total) by mouth daily. 02/07/15  Yes Troy Sine, MD  loratadine (CLARITIN) 10 MG tablet Take 10 mg by mouth daily.   Yes Historical Provider, MD  naproxen sodium (ANAPROX) 220 MG tablet Take 440 mg by mouth 2 (two) times daily as needed (for pain).   Yes Historical Provider, MD  nitroGLYCERIN (NITROSTAT) 0.4 MG SL tablet Place 1 tablet (0.4 mg total) under the tongue every 5 (five) minutes as needed for chest pain. 01/18/14  Yes Luke K Kilroy, PA-C  ranitidine (ZANTAC) 150 MG tablet Take 150 mg by mouth daily as needed for heartburn. For acid reflux   Yes Historical Provider, MD  ranolazine (RANEXA) 1000 MG SR tablet Take 1 tablet (1,000 mg total) by mouth 2 (two) times daily. 02/07/15  Yes Troy Sine, MD   ROS: The patient denies fevers, chills, night sweats, unintentional weight loss, chest pain, palpitations, wheezing, dyspnea on exertion, nausea, vomiting, abdominal pain, dysuria, hematuria, melena, numbness, weakness, or tingling.  All other systems have been reviewed and were otherwise negative with the exception of those mentioned in the HPI and as above.    PHYSICAL EXAM: Filed Vitals:   03/18/15 1503  BP: 132/82  Pulse: 82  Temp: 97.4 F (36.3 C)  Resp: 16   Body mass index is 24.8 kg/(m^2).  General: Alert, no acute distress HEENT:  Normocephalic, atraumatic, oropharynx patent. Eye: Juliette Mangle [facility] Cardiovascular:  Regular rate and rhythm, no rubs murmurs or gallops. Faint carrotid bruits, radial pulse intact. No pedal edema.  Respiratory: Decreased breath sounds in the bases. No wheezes, rales, or rhonchi.  No cyanosis, no use of  accessory musculature Abdominal: No organomegaly, abdomen is soft and non-tender, positive bowel sounds.  No masses. Musculoskeletal: Gait intact. No edema, tenderness Skin: No rashes. Neurologic: Facial musculature symmetric. Psychiatric: Patient acts appropriately throughout our interaction. Lymphatic: No cervical or submandibular lymphadenopathy  LABS: Results for orders placed or performed in visit on 46/50/35  Basic Metabolic Panel (BMET)  Result Value Ref Range   Sodium 125 (L) 135 - 146 mmol/L   Potassium 4.8  3.5 - 5.3 mmol/L   Chloride 93 (L) 98 - 110 mmol/L   CO2 23 20 - 31 mmol/L   Glucose, Bld 112 (H) 65 - 99 mg/dL   BUN 14 7 - 25 mg/dL   Creat 1.12 0.70 - 1.25 mg/dL   Calcium 9.4 8.6 - 10.3 mg/dL   ASSESSMENT/PLAN: Creatinine down to 1.12. His sodium is still low but I agree with Dr. Ferrel Logan it is too risky to wait and put off his surgery. I encouraged patient to go ahead with the surgical procedure to be performed by [name]. Gross sideeffects, risk and benefits, and alternatives of medications d/w patient. Patient is aware that all medications have potential sideeffects and we are unable to predict every sideeffect or drug-drug interaction that may occur.  By signing my name below, I, Nadim Abuhashem, attest that this documentation has been prepared under the direction and in the presence of Nena Jordan, MD.  Electronically Signed: Lora Havens, medical scribe. 03/18/2015, 3:33 PM.  Arlyss Queen MD 03/18/2015 3:33 PM

## 2015-03-20 ENCOUNTER — Encounter: Payer: Self-pay | Admitting: Cardiovascular Disease

## 2015-03-20 ENCOUNTER — Telehealth: Payer: Self-pay | Admitting: *Deleted

## 2015-03-20 DIAGNOSIS — I6529 Occlusion and stenosis of unspecified carotid artery: Secondary | ICD-10-CM

## 2015-03-20 DIAGNOSIS — Z01812 Encounter for preprocedural laboratory examination: Secondary | ICD-10-CM

## 2015-03-20 NOTE — Telephone Encounter (Signed)
Spoke with Jason [name] and gave him his procedure instructions.  Also given appt ate and time.   Wednesday 03/28/15 @ 8:30 am---patient voiced his understanding.

## 2015-03-20 NOTE — Telephone Encounter (Signed)
[employer] of

## 2015-03-21 NOTE — Telephone Encounter (Addendum)
Left message for patient to call back.  Need to schedule pt lab work before procedures on 10/5.  Orders placed for lab work, pt needs to be notified.

## 2015-03-21 NOTE — Addendum Note (Signed)
Addended by: Newt Minion on: 03/21/2015 10:20 AM   Modules accepted: Orders

## 2015-03-23 LAB — CBC WITH DIFFERENTIAL/PLATELET
BASOS ABS: 0 10*3/uL (ref 0.0–0.1)
BASOS PCT: 0 % (ref 0–1)
EOS ABS: 0.4 10*3/uL (ref 0.0–0.7)
Eosinophils Relative: 4 % (ref 0–5)
HCT: 44.2 % (ref 39.0–52.0)
HEMOGLOBIN: 15.5 g/dL (ref 13.0–17.0)
Lymphocytes Relative: 26 % (ref 12–46)
Lymphs Abs: 2.5 10*3/uL (ref 0.7–4.0)
MCH: 34 pg (ref 26.0–34.0)
MCHC: 35.1 g/dL (ref 30.0–36.0)
MCV: 96.9 fL (ref 78.0–100.0)
MPV: 8.4 fL — AB (ref 8.6–12.4)
Monocytes Absolute: 0.8 10*3/uL (ref 0.1–1.0)
Monocytes Relative: 8 % (ref 3–12)
NEUTROS ABS: 5.9 10*3/uL (ref 1.7–7.7)
NEUTROS PCT: 62 % (ref 43–77)
PLATELETS: 250 10*3/uL (ref 150–400)
RBC: 4.56 MIL/uL (ref 4.22–5.81)
RDW: 13.1 % (ref 11.5–15.5)
WBC: 9.5 10*3/uL (ref 4.0–10.5)

## 2015-03-24 LAB — BASIC METABOLIC PANEL
BUN: 11 mg/dL (ref 7–25)
CALCIUM: 9.8 mg/dL (ref 8.6–10.3)
CHLORIDE: 98 mmol/L (ref 98–110)
CO2: 25 mmol/L (ref 20–31)
CREATININE: 1.28 mg/dL — AB (ref 0.70–1.25)
Glucose, Bld: 131 mg/dL — ABNORMAL HIGH (ref 65–99)
Potassium: 5.3 mmol/L (ref 3.5–5.3)
SODIUM: 134 mmol/L — AB (ref 135–146)

## 2015-03-24 LAB — PROTIME-INR
INR: 0.94 (ref <1.50)
PROTHROMBIN TIME: 12.7 s (ref 11.6–15.2)

## 2015-03-24 LAB — APTT: APTT: 35 s (ref 24–37)

## 2015-03-27 ENCOUNTER — Encounter: Payer: Self-pay | Admitting: Emergency Medicine

## 2015-03-28 ENCOUNTER — Encounter (HOSPITAL_COMMUNITY)
Admission: AD | Disposition: A | Discharge status: Home / Self Care | Payer: Self-pay | Source: Ambulatory Visit | Attending: Cardiovascular Disease

## 2015-03-28 ENCOUNTER — Inpatient Hospital Stay (HOSPITAL_COMMUNITY)
Admission: AD | Admit: 2015-03-28 | Discharge: 2015-04-04 | DRG: 034 | Disposition: A | Discharge status: Home / Self Care | Payer: Self-pay | Source: Ambulatory Visit | Attending: Cardiovascular Disease | Admitting: Cardiovascular Disease

## 2015-03-28 DIAGNOSIS — I452 Bifascicular block: Secondary | ICD-10-CM | POA: Diagnosis present

## 2015-03-28 DIAGNOSIS — I6523 Occlusion and stenosis of bilateral carotid arteries: Principal | ICD-10-CM | POA: Diagnosis present

## 2015-03-28 DIAGNOSIS — R200 Anesthesia of skin: Secondary | ICD-10-CM | POA: Insufficient documentation

## 2015-03-28 DIAGNOSIS — Z955 Presence of coronary angioplasty implant and graft: Secondary | ICD-10-CM

## 2015-03-28 DIAGNOSIS — I493 Ventricular premature depolarization: Secondary | ICD-10-CM | POA: Diagnosis not present

## 2015-03-28 DIAGNOSIS — Z7902 Long term (current) use of antithrombotics/antiplatelets: Secondary | ICD-10-CM

## 2015-03-28 DIAGNOSIS — E871 Hypo-osmolality and hyponatremia: Secondary | ICD-10-CM | POA: Diagnosis not present

## 2015-03-28 DIAGNOSIS — I2510 Atherosclerotic heart disease of native coronary artery without angina pectoris: Secondary | ICD-10-CM | POA: Diagnosis present

## 2015-03-28 DIAGNOSIS — I10 Essential (primary) hypertension: Secondary | ICD-10-CM | POA: Diagnosis present

## 2015-03-28 DIAGNOSIS — E1122 Type 2 diabetes mellitus with diabetic chronic kidney disease: Secondary | ICD-10-CM | POA: Diagnosis present

## 2015-03-28 DIAGNOSIS — I639 Cerebral infarction, unspecified: Secondary | ICD-10-CM | POA: Diagnosis not present

## 2015-03-28 DIAGNOSIS — M1990 Unspecified osteoarthritis, unspecified site: Secondary | ICD-10-CM | POA: Diagnosis present

## 2015-03-28 DIAGNOSIS — I6521 Occlusion and stenosis of right carotid artery: Secondary | ICD-10-CM

## 2015-03-28 DIAGNOSIS — Z01812 Encounter for preprocedural laboratory examination: Secondary | ICD-10-CM

## 2015-03-28 DIAGNOSIS — E119 Type 2 diabetes mellitus without complications: Secondary | ICD-10-CM

## 2015-03-28 DIAGNOSIS — R008 Other abnormalities of heart beat: Secondary | ICD-10-CM | POA: Diagnosis not present

## 2015-03-28 DIAGNOSIS — I252 Old myocardial infarction: Secondary | ICD-10-CM

## 2015-03-28 DIAGNOSIS — I129 Hypertensive chronic kidney disease with stage 1 through stage 4 chronic kidney disease, or unspecified chronic kidney disease: Secondary | ICD-10-CM | POA: Diagnosis present

## 2015-03-28 DIAGNOSIS — G451 Carotid artery syndrome (hemispheric): Secondary | ICD-10-CM | POA: Diagnosis present

## 2015-03-28 DIAGNOSIS — N289 Disorder of kidney and ureter, unspecified: Secondary | ICD-10-CM | POA: Diagnosis not present

## 2015-03-28 DIAGNOSIS — E785 Hyperlipidemia, unspecified: Secondary | ICD-10-CM | POA: Diagnosis present

## 2015-03-28 DIAGNOSIS — I959 Hypotension, unspecified: Secondary | ICD-10-CM

## 2015-03-28 DIAGNOSIS — Z951 Presence of aortocoronary bypass graft: Secondary | ICD-10-CM

## 2015-03-28 DIAGNOSIS — N183 Chronic kidney disease, stage 3 (moderate): Secondary | ICD-10-CM | POA: Diagnosis present

## 2015-03-28 DIAGNOSIS — I6529 Occlusion and stenosis of unspecified carotid artery: Secondary | ICD-10-CM | POA: Diagnosis present

## 2015-03-28 DIAGNOSIS — I9581 Postprocedural hypotension: Secondary | ICD-10-CM | POA: Diagnosis not present

## 2015-03-28 HISTORY — DX: Cerebral infarction, unspecified: I63.9

## 2015-03-28 HISTORY — PX: PERIPHERAL VASCULAR CATHETERIZATION: SHX172C

## 2015-03-28 LAB — CBC WITH DIFFERENTIAL/PLATELET
BASOS PCT: 0 %
Basophils Absolute: 0 10*3/uL (ref 0.0–0.1)
EOS ABS: 0.1 10*3/uL (ref 0.0–0.7)
Eosinophils Relative: 1 %
HEMATOCRIT: 39 % (ref 39.0–52.0)
Hemoglobin: 13.3 g/dL (ref 13.0–17.0)
Lymphocytes Relative: 18 %
Lymphs Abs: 2 10*3/uL (ref 0.7–4.0)
MCH: 33.2 pg (ref 26.0–34.0)
MCHC: 34.1 g/dL (ref 30.0–36.0)
MCV: 97.3 fL (ref 78.0–100.0)
MONO ABS: 0.8 10*3/uL (ref 0.1–1.0)
MONOS PCT: 8 %
NEUTROS ABS: 8.2 10*3/uL — AB (ref 1.7–7.7)
Neutrophils Relative %: 73 %
Platelets: 221 10*3/uL (ref 150–400)
RBC: 4.01 MIL/uL — ABNORMAL LOW (ref 4.22–5.81)
RDW: 12.5 % (ref 11.5–15.5)
WBC: 11.1 10*3/uL — ABNORMAL HIGH (ref 4.0–10.5)

## 2015-03-28 LAB — COMPREHENSIVE METABOLIC PANEL
ALT: 19 U/L (ref 17–63)
AST: 18 U/L (ref 15–41)
Albumin: 3.5 g/dL (ref 3.5–5.0)
Alkaline Phosphatase: 69 U/L (ref 38–126)
Anion gap: 7 (ref 5–15)
BUN: 18 mg/dL (ref 6–20)
CO2: 23 mmol/L (ref 22–32)
Calcium: 8.8 mg/dL — ABNORMAL LOW (ref 8.9–10.3)
Chloride: 97 mmol/L — ABNORMAL LOW (ref 101–111)
Creatinine, Ser: 1.52 mg/dL — ABNORMAL HIGH (ref 0.61–1.24)
GFR calc Af Amer: 55 mL/min — ABNORMAL LOW (ref >60)
GFR calc non Af Amer: 47 mL/min — ABNORMAL LOW (ref >60)
Glucose, Bld: 162 mg/dL — ABNORMAL HIGH (ref 65–99)
Potassium: 5.1 mmol/L (ref 3.5–5.1)
Sodium: 127 mmol/L — ABNORMAL LOW (ref 135–145)
Total Bilirubin: 0.5 mg/dL (ref 0.3–1.2)
Total Protein: 6.2 g/dL — ABNORMAL LOW (ref 6.5–8.1)

## 2015-03-28 LAB — MRSA PCR SCREENING: MRSA by PCR: NEGATIVE

## 2015-03-28 LAB — GLUCOSE, CAPILLARY
GLUCOSE-CAPILLARY: 182 mg/dL — AB (ref 65–99)
Glucose-Capillary: 109 mg/dL — ABNORMAL HIGH (ref 65–99)
Glucose-Capillary: 173 mg/dL — ABNORMAL HIGH (ref 65–99)

## 2015-03-28 LAB — POCT ACTIVATED CLOTTING TIME: Activated Clotting Time: 398 seconds

## 2015-03-28 SURGERY — CAROTID PTA/STENT INTERVENTION
Anesthesia: LOCAL

## 2015-03-28 MED ORDER — ACETAMINOPHEN 325 MG PO TABS: 650.0000 mg | ORAL_TABLET | ORAL | Status: DC | PRN

## 2015-03-28 MED ORDER — DEXTROSE 5 % IV SOLN
0.0000 ug/min | INTRAVENOUS | Status: DC
  Administered 2015-03-28: 2 ug/min via INTRAVENOUS
  Administered 2015-03-29: 4 ug/min via INTRAVENOUS
  Filled 2015-03-28 (×3): qty 4

## 2015-03-28 MED ORDER — CLOPIDOGREL BISULFATE 75 MG PO TABS
ORAL_TABLET | ORAL | Status: AC
  Administered 2015-03-28: 75 mg via ORAL
  Filled 2015-03-28: qty 1

## 2015-03-28 MED ORDER — CLOPIDOGREL BISULFATE 75 MG PO TABS: 75.0000 mg | ORAL_TABLET | Freq: Every day | ORAL | Status: DC

## 2015-03-28 MED ORDER — ONDANSETRON HCL 4 MG/2ML IJ SOLN
4.0000 mg | Freq: Four times a day (QID) | INTRAMUSCULAR | Status: DC | PRN
  Administered 2015-03-28: 4 mg via INTRAVENOUS
  Filled 2015-03-28: qty 2

## 2015-03-28 MED ORDER — LISINOPRIL 10 MG PO TABS: 10.0000 mg | ORAL_TABLET | Freq: Every day | ORAL | Status: DC

## 2015-03-28 MED ORDER — ATROPINE SULFATE 0.1 MG/ML IJ SOLN
INTRAMUSCULAR | Status: AC
  Filled 2015-03-28: qty 10

## 2015-03-28 MED ORDER — LORATADINE 10 MG PO TABS
10.0000 mg | ORAL_TABLET | Freq: Every day | ORAL | Status: DC
  Administered 2015-03-28 – 2015-04-04 (×8): 10 mg via ORAL
  Filled 2015-03-28 (×8): qty 1

## 2015-03-28 MED ORDER — SODIUM CHLORIDE 0.9 % WEIGHT BASED INFUSION
1.0000 mL/kg/h | INTRAVENOUS | Status: DC
  Administered 2015-03-28: 1 mL/kg/h via INTRAVENOUS

## 2015-03-28 MED ORDER — ISOSORBIDE MONONITRATE ER 60 MG PO TB24: 60.0000 mg | ORAL_TABLET | Freq: Every day | ORAL | Status: DC

## 2015-03-28 MED ORDER — SODIUM CHLORIDE 0.9 % IV SOLN
250.0000 mg | INTRAVENOUS | Status: DC | PRN
  Administered 2015-03-28: 1.75 mg/kg/h via INTRAVENOUS

## 2015-03-28 MED ORDER — RANOLAZINE ER 500 MG PO TB12
1000.0000 mg | ORAL_TABLET | Freq: Two times a day (BID) | ORAL | Status: DC
  Administered 2015-03-28 – 2015-04-04 (×14): 1000 mg via ORAL
  Filled 2015-03-28 (×16): qty 2

## 2015-03-28 MED ORDER — SODIUM CHLORIDE 0.9 % IV SOLN
INTRAVENOUS | Status: AC
  Administered 2015-03-28: 22:00:00 via INTRAVENOUS

## 2015-03-28 MED ORDER — LIDOCAINE HCL (PF) 1 % IJ SOLN
INTRAMUSCULAR | Status: AC
  Filled 2015-03-28: qty 30

## 2015-03-28 MED ORDER — BIVALIRUDIN BOLUS VIA INFUSION - CUPID
INTRAVENOUS | Status: DC | PRN
  Administered 2015-03-28: 56.85 mg via INTRAVENOUS

## 2015-03-28 MED ORDER — NAPROXEN SODIUM 275 MG PO TABS: 440.0000 mg | ORAL_TABLET | Freq: Two times a day (BID) | ORAL | Status: DC | PRN

## 2015-03-28 MED ORDER — SODIUM CHLORIDE 0.9 % IV SOLN: INTRAVENOUS | Status: DC

## 2015-03-28 MED ORDER — SERTRALINE HCL 50 MG PO TABS
50.0000 mg | ORAL_TABLET | Freq: Every day | ORAL | Status: DC
  Administered 2015-03-28 – 2015-04-04 (×8): 50 mg via ORAL
  Filled 2015-03-28 (×8): qty 1

## 2015-03-28 MED ORDER — ATROPINE SULFATE 0.1 MG/ML IJ SOLN
INTRAMUSCULAR | Status: DC | PRN
  Administered 2015-03-28 (×2): 0.5 mg via INTRAVENOUS

## 2015-03-28 MED ORDER — CLOPIDOGREL BISULFATE 75 MG PO TABS
75.0000 mg | ORAL_TABLET | Freq: Once | ORAL | Status: AC
  Administered 2015-03-28: 75 mg via ORAL

## 2015-03-28 MED ORDER — ALPRAZOLAM 0.25 MG PO TABS
0.2500 mg | ORAL_TABLET | Freq: Two times a day (BID) | ORAL | Status: DC | PRN
  Administered 2015-03-30 (×2): 0.5 mg via ORAL
  Administered 2015-03-31 (×2): 0.25 mg via ORAL
  Administered 2015-04-02 – 2015-04-04 (×4): 0.5 mg via ORAL
  Filled 2015-03-28 (×6): qty 2
  Filled 2015-03-28: qty 1
  Filled 2015-03-28: qty 2

## 2015-03-28 MED ORDER — SODIUM CHLORIDE 0.9 % WEIGHT BASED INFUSION
3.0000 mL/kg/h | INTRAVENOUS | Status: DC
  Administered 2015-03-28: 3 mL/kg/h via INTRAVENOUS

## 2015-03-28 MED ORDER — ACETAMINOPHEN 325 MG PO TABS
650.0000 mg | ORAL_TABLET | ORAL | Status: DC | PRN
  Administered 2015-03-31 – 2015-04-02 (×3): 650 mg via ORAL
  Filled 2015-03-28 (×3): qty 2

## 2015-03-28 MED ORDER — SODIUM CHLORIDE 0.9 % IJ SOLN: 3.0000 mL | INTRAMUSCULAR | Status: DC | PRN

## 2015-03-28 MED ORDER — MORPHINE SULFATE (PF) 2 MG/ML IV SOLN: 1.0000 mg | INTRAVENOUS | Status: DC | PRN

## 2015-03-28 MED ORDER — NOREPINEPHRINE BITARTRATE 1 MG/ML IV SOLN
INTRAVENOUS | Status: AC
  Filled 2015-03-28: qty 4

## 2015-03-28 MED ORDER — LIDOCAINE HCL (PF) 1 % IJ SOLN
INTRAMUSCULAR | Status: DC | PRN
  Administered 2015-03-28: 10:00:00

## 2015-03-28 MED ORDER — HEPARIN (PORCINE) IN NACL 2-0.9 UNIT/ML-% IJ SOLN
INTRAMUSCULAR | Status: AC
  Filled 2015-03-28: qty 1000

## 2015-03-28 MED ORDER — ASPIRIN 81 MG PO CHEW
CHEWABLE_TABLET | ORAL | Status: AC
  Administered 2015-03-28: 81 mg via ORAL
  Filled 2015-03-28: qty 1

## 2015-03-28 MED ORDER — CLOPIDOGREL BISULFATE 75 MG PO TABS
75.0000 mg | ORAL_TABLET | Freq: Every day | ORAL | Status: DC
  Administered 2015-03-29 – 2015-04-04 (×7): 75 mg via ORAL
  Filled 2015-03-28 (×8): qty 1

## 2015-03-28 MED ORDER — BIVALIRUDIN 250 MG IV SOLR
INTRAVENOUS | Status: AC
  Filled 2015-03-28: qty 250

## 2015-03-28 MED ORDER — FAMOTIDINE 20 MG PO TABS
20.0000 mg | ORAL_TABLET | Freq: Every day | ORAL | Status: DC
  Administered 2015-03-28 – 2015-04-03 (×7): 20 mg via ORAL
  Filled 2015-03-28 (×7): qty 1

## 2015-03-28 MED ORDER — IODIXANOL 320 MG/ML IV SOLN
INTRAVENOUS | Status: DC | PRN
  Administered 2015-03-28: 140 mL via INTRA_ARTERIAL

## 2015-03-28 MED ORDER — ATORVASTATIN CALCIUM 40 MG PO TABS
40.0000 mg | ORAL_TABLET | Freq: Every day | ORAL | Status: DC
  Administered 2015-03-28 – 2015-04-04 (×8): 40 mg via ORAL
  Filled 2015-03-28 (×8): qty 1

## 2015-03-28 MED ORDER — ASPIRIN EC 325 MG PO TBEC
325.0000 mg | DELAYED_RELEASE_TABLET | Freq: Every day | ORAL | Status: DC
Start: 2015-03-28 — End: 2015-04-04
  Administered 2015-03-28 – 2015-04-04 (×8): 325 mg via ORAL
  Filled 2015-03-28 (×9): qty 1

## 2015-03-28 MED ORDER — ASPIRIN 81 MG PO CHEW
81.0000 mg | CHEWABLE_TABLET | ORAL | Status: AC
  Administered 2015-03-28: 81 mg via ORAL

## 2015-03-28 MED ORDER — ATROPINE SULFATE 0.1 MG/ML IJ SOLN
INTRAMUSCULAR | Status: AC
  Administered 2015-03-28: 0.5 mg
  Filled 2015-03-28: qty 10

## 2015-03-28 MED ORDER — NITROGLYCERIN 0.4 MG SL SUBL: 0.4000 mg | SUBLINGUAL_TABLET | SUBLINGUAL | Status: DC | PRN

## 2015-03-28 MED ORDER — DOPAMINE-DEXTROSE 3.2-5 MG/ML-% IV SOLN
INTRAVENOUS | Status: DC | PRN
  Administered 2015-03-28: 10 ug/kg/min via INTRAVENOUS

## 2015-03-28 SURGICAL SUPPLY — 18 items
BALLN EMERGE MR 3.0X20 (BALLOONS) ×2
BALLOON EMERGE MR 3.0X20 (BALLOONS) IMPLANT
CATH ANGIO 5F JB1 100CM (CATHETERS) ×1 IMPLANT
CATH ANGIO 5F PIGTAIL 100CM (CATHETERS) ×1 IMPLANT
CATH INFINITI VERT 5FR 125CM (CATHETERS) ×1 IMPLANT
DEVICE EMBOSHIELD NAV6 4.0-7.0 (WIRE) ×1 IMPLANT
KIT ENCORE 26 ADVANTAGE (KITS) ×1 IMPLANT
KIT PV (KITS) ×2 IMPLANT
SHEATH PINNACLE 5F 10CM (SHEATH) ×1 IMPLANT
SHEATH PINNACLE 6F 10CM (SHEATH) ×1 IMPLANT
SHEATH SHUTTLE SELECT 6F (SHEATH) ×1 IMPLANT
SHIELD RADPAD SCOOP 12X17 (MISCELLANEOUS) ×1 IMPLANT
STENT XACT CAR 9-7X30X136 (Permanent Stent) ×1 IMPLANT
SYR MEDRAD MARK V 150ML (SYRINGE) ×2 IMPLANT
TRANSDUCER W/STOPCOCK (MISCELLANEOUS) ×2 IMPLANT
TRAY PV CATH (CUSTOM PROCEDURE TRAY) ×2 IMPLANT
WIRE AMPLATZ SSTIFF .035X260CM (WIRE) ×1 IMPLANT
WIRE HITORQ VERSACORE ST 145CM (WIRE) ×1 IMPLANT

## 2015-03-28 NOTE — Interval H&P Note (Signed)
History and Physical Interval Note:  03/28/2015 8:35 AM  Jason [name]  has presented today for surgery, with the diagnosis of carotid stenosis  The various methods of treatment have been discussed with the patient and family. After consideration of risks, benefits and other options for treatment, the patient has consented to  Procedure(s): Carotid PTA/Stent Intervention (N/A) as a surgical intervention .  The patient's history has been reviewed, patient examined, no change in status, stable for surgery.  I have reviewed the patient's chart and labs.  Questions were answered to the patient's satisfaction.     Quay Burow

## 2015-03-28 NOTE — H&P (View-Only) (Signed)
   Patient ID: Jason [name], male    DOB: 08-Jun-1951, 64 y.o.   MRN: [id number]  HPI    Review of Systems    Physical Exam

## 2015-03-28 NOTE — Progress Notes (Signed)
Pt with continued labile blood pressure. Dopamine gtt infusing per Dr Gwenlyn Found verbal orders to titrate upon arrival to floor; orders for levophed received and awaiting gtt; pt with no complaints; left groin site stable; will continue to closely monitor

## 2015-03-28 NOTE — Progress Notes (Signed)
  RN called stating patient now with labile BP. SB dropped again in the 60s but back up to 93 systolic. He is on low dose levophed, 2 mcg. This cannot be titrated in the stepdown until. He will need transfer to the CCU so that his levophed can be titrated to keep BP stable. Overall, he feels fine and mentating well. Will check a CBC to assess H/H and WBC and a CMP.   Lenya Sterne

## 2015-03-28 NOTE — Progress Notes (Signed)
Sheath pulled from left groin and manual pressure was held x 25 minutes. Patient did has a decrease in HR and BP dropped to 67/46 5-6 minutes in to the sheath pull. RN entered room and the patient was given 0.5 mg Atropine. HR and BP returned to baseline and patient stated that "he felt better". Groin is a level zero at this time and patient was given instructions.

## 2015-03-29 ENCOUNTER — Encounter (HOSPITAL_COMMUNITY): Payer: Self-pay | Admitting: Cardiovascular Disease

## 2015-03-29 ENCOUNTER — Ambulatory Visit: Payer: Self-pay | Admitting: Emergency Medicine

## 2015-03-29 DIAGNOSIS — I6523 Occlusion and stenosis of bilateral carotid arteries: Principal | ICD-10-CM

## 2015-03-29 DIAGNOSIS — I959 Hypotension, unspecified: Secondary | ICD-10-CM

## 2015-03-29 LAB — CBC
HCT: 40 % (ref 39.0–52.0)
Hemoglobin: 13.6 g/dL (ref 13.0–17.0)
MCH: 33 pg (ref 26.0–34.0)
MCHC: 34 g/dL (ref 30.0–36.0)
MCV: 97.1 fL (ref 78.0–100.0)
PLATELETS: 229 10*3/uL (ref 150–400)
RBC: 4.12 MIL/uL — AB (ref 4.22–5.81)
RDW: 12.6 % (ref 11.5–15.5)
WBC: 12.4 10*3/uL — AB (ref 4.0–10.5)

## 2015-03-29 LAB — BASIC METABOLIC PANEL
ANION GAP: 7 (ref 5–15)
BUN: 14 mg/dL (ref 6–20)
CALCIUM: 9 mg/dL (ref 8.9–10.3)
CO2: 22 mmol/L (ref 22–32)
Chloride: 101 mmol/L (ref 101–111)
Creatinine, Ser: 1.38 mg/dL — ABNORMAL HIGH (ref 0.61–1.24)
GFR, EST NON AFRICAN AMERICAN: 53 mL/min — AB (ref >60)
Glucose, Bld: 159 mg/dL — ABNORMAL HIGH (ref 65–99)
POTASSIUM: 4.4 mmol/L (ref 3.5–5.1)
SODIUM: 130 mmol/L — AB (ref 135–145)

## 2015-03-29 LAB — GLUCOSE, CAPILLARY
GLUCOSE-CAPILLARY: 170 mg/dL — AB (ref 65–99)
Glucose-Capillary: 212 mg/dL — ABNORMAL HIGH (ref 65–99)
Glucose-Capillary: 198 mg/dL — ABNORMAL HIGH (ref 65–99)

## 2015-03-29 LAB — MAGNESIUM: Magnesium: 1.7 mg/dL (ref 1.7–2.4)

## 2015-03-29 MED ORDER — SODIUM CHLORIDE 0.45 % IV SOLN
INTRAVENOUS | Status: DC
  Administered 2015-03-29 (×2): via INTRAVENOUS

## 2015-03-29 MED ORDER — PNEUMOCOCCAL VAC POLYVALENT 25 MCG/0.5ML IJ INJ
0.5000 mL | INJECTION | INTRAMUSCULAR | Status: AC
  Administered 2015-03-30: 0.5 mL via INTRAMUSCULAR
  Filled 2015-03-29: qty 0.5

## 2015-03-29 MED ORDER — INSULIN ASPART 100 UNIT/ML ~~LOC~~ SOLN
0.0000 [IU] | Freq: Three times a day (TID) | SUBCUTANEOUS | Status: DC
  Administered 2015-03-29: 5 [IU] via SUBCUTANEOUS
  Administered 2015-03-29: 3 [IU] via SUBCUTANEOUS
  Administered 2015-03-30: 5 [IU] via SUBCUTANEOUS
  Administered 2015-03-30 – 2015-03-31 (×3): 2 [IU] via SUBCUTANEOUS
  Administered 2015-04-01 (×2): 3 [IU] via SUBCUTANEOUS
  Administered 2015-04-02: 2 [IU] via SUBCUTANEOUS
  Administered 2015-04-03: 3 [IU] via SUBCUTANEOUS
  Administered 2015-04-03: 2 [IU] via SUBCUTANEOUS
  Administered 2015-04-04 (×3): 3 [IU] via SUBCUTANEOUS

## 2015-03-29 MED ORDER — LOPERAMIDE HCL 2 MG PO CAPS: 4.0000 mg | ORAL_CAPSULE | ORAL | Status: DC | PRN

## 2015-03-29 MED ORDER — NICOTINE 21 MG/24HR TD PT24
21.0000 mg | MEDICATED_PATCH | Freq: Every day | TRANSDERMAL | Status: DC
  Administered 2015-03-29 – 2015-04-04 (×7): 21 mg via TRANSDERMAL
  Filled 2015-03-29 (×7): qty 1

## 2015-03-29 MED ORDER — INSULIN ASPART 100 UNIT/ML ~~LOC~~ SOLN
0.0000 [IU] | Freq: Every day | SUBCUTANEOUS | Status: DC
  Administered 2015-04-01: 3 [IU] via SUBCUTANEOUS
  Administered 2015-04-02: 2 [IU] via SUBCUTANEOUS

## 2015-03-29 NOTE — Progress Notes (Signed)
Pt assisted to bathroom,ambulated without complication. When RN sat pt in chair, pt was complaining of dizziness and bp was 73/38. Continue to titrate levo for goal of systolic greater than 660.  Etta Quill

## 2015-03-29 NOTE — Progress Notes (Signed)
[name] aware pt had 4 loose bm's overnight, declined to send cdiff sample. Etta Quill

## 2015-03-29 NOTE — Care Management Note (Signed)
Case Management Note  Patient Details  Name: Jason [name] MRN: [id number] Date of Birth: 02-Jul-1950  Subjective/Objective:   Adm w carotid stenosis, hypotension                 Action/Plan: lives alone, pcp dr Remo Lipps daub   Expected Discharge Date:                  Expected Discharge Plan:     In-House Referral:     Discharge planning Services     Post Acute Care Choice:    Choice offered to:     DME Arranged:    DME Agency:     HH Arranged:    [place] Agency:     Status of Service:     Medicare Important Message Given:    Date Medicare IM Given:    Medicare IM give by:    Date Additional Medicare IM Given:    Additional Medicare Important Message give by:     If discussed at [place] of Stay Meetings, dates discussed:    Additional Comments: ur review done  Lacretia Leigh, RN 03/29/2015, 7:30 AM

## 2015-03-29 NOTE — Progress Notes (Signed)
[facility] Progress Note Patient Name: Jason [name] DOB: 1950/07/15 MRN: [id number]   Date of Service  03/29/2015  HPI/Events of Note  Notified that patient needs DVT prophylaxis.   eICU Interventions  Will order SCD's to bilateral lower extremities.      Intervention Category Intermediate Interventions: Best-practice therapies (e.g. DVT, beta blocker, etc.)  Sommer,Steven Eugene 03/29/2015, 10:43 PM

## 2015-03-29 NOTE — Progress Notes (Signed)
Patient Name: Jason [name] Date of Encounter: 03/29/2015  Principal Problem:   Carotid stenosis- moderate 2011, 95% 2016 s/p stent Active Problems:   DM (diabetes mellitus) ([place])   HTN (hypertension)   Carotid artery narrowing   Primary Cardiologist: Dr Claiborne Billings, Dr Gwenlyn Found  Patient Profile: 64 yo male w/ hx CABG 2011, DM, HTN, HL, tob use, SVG-PDA-PLA 100%, admitted 10/05 for R-ICA stenosis>>stent. Labile BP after procedure req Levophed.   SUBJECTIVE: No chest pain overnight, willing to try nicotine patch, but not sure he can quit smoking. No SOB. Mild pain upper neck just below jaw.  OBJECTIVE Filed Vitals:   03/29/15 0500 03/29/15 0600 03/29/15 0700 03/29/15 0741  BP: 148/56 134/44 98/44   Pulse: 55 58 63   Temp:    97.4 F (36.3 C)  TempSrc:    Oral  Resp: 23 14 24    Height:      Weight:      SpO2: 97% 97% 96%     Intake/Output Summary (Last 24 hours) at 03/29/15 0811 Last data filed at 03/29/15 0700  Gross per 24 hour  Intake 2148.47 ml  Output   4050 ml  Net -1901.53 ml   Filed Weights   03/28/15 0628 03/28/15 1100  Weight: 167 lb (75.751 kg) 165 lb (74.844 kg)    PHYSICAL EXAM General: Well developed, well nourished, male in no acute distress. Head: Normocephalic, atraumatic.  Neck: Supple without bruits, JVD not elevated Lungs:  Resp regular and unlabored, few rales, good air exchange. Heart: RRR, S1, S2, no S3, S4, or murmur; no rub. Abdomen: Soft, non-tender, non-distended, BS + x 4.  Extremities: No clubbing, cyanosis, edema. L groin cath site without ecchymosis or hematoma. Bilateral femoral bruits Neuro: Alert and oriented X 3. Moves all extremities spontaneously. Psych: Normal affect.  LABS: CBC:  Recent Labs  03/28/15 2052  WBC 11.1*  NEUTROABS 8.2*  HGB 13.3  HCT 39.0  MCV 97.3  PLT 403   Basic Metabolic Panel:  Recent Labs  03/28/15 2052  NA 127*  K 5.1  CL 97*  CO2 23  GLUCOSE 162*  BUN 18  CREATININE 1.52*    CALCIUM 8.8*   Liver Function Tests:  Recent Labs  03/28/15 2052  AST 18  ALT 19  ALKPHOS 69  BILITOT 0.5  PROT 6.2*  ALBUMIN 3.5   Procedures Performed: 1. Aortic arch angiography 2. Selective right and left carotid angiography both intra-and extracranial venous 3. Carotid artery stenting using distal protection right internal carotid artery Angiographic Data:  1: Aortic arch angiogram-type I aortic arch. All arch vessels were intact. 2: Left carotid artery-the left internal carotid artery was occluded at its origin. 3: Right carotid artery-there was a calcified 95% stenosis involving the right carotid bulb, rate internal and external carotid artery. The right coronary artery filled the right and left intracranial circulation. The intracranial cerebral intervention be interpreted by the neuro interventional radiologist. Final Impression: successful right internal carotid artery PTA and stenting using a nitinol self expanding stent and distal carotid embolic protection in the setting of an occluded contralateral carotid artery and high risk coronary anatomy. The patient tolerated the procedure well. The sheath was then secured and the bivalirudin was discontinued. The patient left the lab in stable condition neurologically and hemodynamically on a low-dose dopamine drip. The sheath will be removed for several hours and pressure held. The patient will be hydrated overnight and discharged home in the morning on aspirin and Plavix.  Follow-up carotid Dopplers performed in our [place] office in several weeks. I will see him back in the office shortly thereafter for outpatient medical follow-up.  TELE:   SR, freq PVCs, trigeminy     ECG: 03/28/2015 SR, RBBB Vent. rate 70 BPM PR interval 150 ms QRS duration 148 ms QT/QTc 454/490 ms P-R-T axes 55 90 54  Current Medications:  . aspirin EC  325 mg Oral Daily  . atorvastatin  40 mg Oral Daily  .  clopidogrel  75 mg Oral Q breakfast  . famotidine  20 mg Oral QHS  . loratadine  10 mg Oral Daily  . ranolazine  1,000 mg Oral BID  . sertraline  50 mg Oral Daily   . sodium chloride 10 mL/hr at 03/28/15 2200  . norepinephrine (LEVOPHED) Adult infusion 4 mcg/min (03/29/15 0615)    ASSESSMENT AND PLAN: Principal Problem:   Carotid stenosis- moderate 2011, 95% 2016 s/p stent - labile hypotension after procedure, on Levophed - BP improving, wean rx as tolerated.  - continue ASA, Plavix, statin  Active Problems:   DM (diabetes mellitus) ([place]) - add SSI - Home metformin on hold    HTN (hypertension) - home lisinopril on hold    Carotid artery narrowing - see above    Hyponatremia, renal insufficiency - Na+ generally 130s till 02/2015, unclear cause - renal function normal 09/2014 - recheck pending    Tobacco abuse - nicotine patch    PVCs and Trigeminy - ck Mg  Plan: tx stepdown if possible, wean Levo, follow labs  Signed, Rosaria Ferries , PA-C 8:11 AM 03/29/2015

## 2015-03-29 NOTE — Progress Notes (Signed)
Subjective:  No CP/SOB. POD #1 RICA CAS  Objective:  Temp:  [97.5 F (36.4 C)-98.7 F (37.1 C)] 97.9 F (36.6 C) (10/06 0335) Pulse Rate:  [0-137] 63 (10/06 0700) Resp:  [0-65] 24 (10/06 0700) BP: (60-157)/(16-105) 98/44 mmHg (10/06 0700) SpO2:  [0 %-100 %] 96 % (10/06 0700) Weight:  [165 lb (74.844 kg)] 165 lb (74.844 kg) (10/05 1100) Weight change: -2 lb (-0.907 kg)  Intake/Output from previous day: 10/05 0701 - 10/06 0700 In: 2148.5 [P.O.:850; I.V.:1298.5] Out: 1638 [Urine:4050]  Intake/Output from this shift:    Physical Exam: General appearance: alert and no distress Neck: no adenopathy, no carotid bruit, no JVD, supple, symmetrical, trachea midline and thyroid not enlarged, symmetric, no tenderness/mass/nodules Lungs: clear to auscultation bilaterally Heart: regular rate and rhythm, S1, S2 normal, no murmur, click, rub or gallop Extremities: extremities normal, atraumatic, no cyanosis or edema and Left groin OK  Lab Results: Results for orders placed or performed during the hospital encounter of 03/28/15 (from the past 48 hour(s))  Glucose, capillary     Status: Abnormal   Collection Time: 03/28/15  6:51 AM  Result Value Ref Range   Glucose-Capillary 109 (H) 65 - 99 mg/dL   Comment 1 Notify RN   POCT Activated clotting time     Status: None   Collection Time: 03/28/15  9:21 AM  Result Value Ref Range   Activated Clotting Time 398 seconds  Glucose, capillary     Status: Abnormal   Collection Time: 03/28/15 11:30 AM  Result Value Ref Range   Glucose-Capillary 173 (H) 65 - 99 mg/dL   Comment 1 Capillary Specimen   MRSA PCR Screening     Status: None   Collection Time: 03/28/15  1:41 PM  Result Value Ref Range   MRSA by PCR NEGATIVE NEGATIVE    Comment:        The GeneXpert MRSA Assay (FDA approved for NASAL specimens only), is one component of a comprehensive MRSA colonization surveillance program. It is not intended to diagnose MRSA infection nor  to guide or monitor treatment for MRSA infections.   Glucose, capillary     Status: Abnormal   Collection Time: 03/28/15  4:36 PM  Result Value Ref Range   Glucose-Capillary 182 (H) 65 - 99 mg/dL   Comment 1 Capillary Specimen   CBC with Differential/Platelet     Status: Abnormal   Collection Time: 03/28/15  8:52 PM  Result Value Ref Range   WBC 11.1 (H) 4.0 - 10.5 K/uL   RBC 4.01 (L) 4.22 - 5.81 MIL/uL   Hemoglobin 13.3 13.0 - 17.0 g/dL   HCT 39.0 39.0 - 52.0 %   MCV 97.3 78.0 - 100.0 fL   MCH 33.2 26.0 - 34.0 pg   MCHC 34.1 30.0 - 36.0 g/dL   RDW 12.5 11.5 - 15.5 %   Platelets 221 150 - 400 K/uL   Neutrophils Relative % 73 %   Neutro Abs 8.2 (H) 1.7 - 7.7 K/uL   Lymphocytes Relative 18 %   Lymphs Abs 2.0 0.7 - 4.0 K/uL   Monocytes Relative 8 %   Monocytes Absolute 0.8 0.1 - 1.0 K/uL   Eosinophils Relative 1 %   Eosinophils Absolute 0.1 0.0 - 0.7 K/uL   Basophils Relative 0 %   Basophils Absolute 0.0 0.0 - 0.1 K/uL  Comprehensive metabolic panel     Status: Abnormal   Collection Time: 03/28/15  8:52 PM  Result Value Ref Range  Sodium 127 (L) 135 - 145 mmol/L   Potassium 5.1 3.5 - 5.1 mmol/L   Chloride 97 (L) 101 - 111 mmol/L   CO2 23 22 - 32 mmol/L   Glucose, Bld 162 (H) 65 - 99 mg/dL   BUN 18 6 - 20 mg/dL   Creatinine, Ser 1.52 (H) 0.61 - 1.24 mg/dL   Calcium 8.8 (L) 8.9 - 10.3 mg/dL   Total Protein 6.2 (L) 6.5 - 8.1 g/dL   Albumin 3.5 3.5 - 5.0 g/dL   AST 18 15 - 41 U/L   ALT 19 17 - 63 U/L   Alkaline Phosphatase 69 38 - 126 U/L   Total Bilirubin 0.5 0.3 - 1.2 mg/dL   GFR calc non Af Amer 47 (L) >60 mL/min   GFR calc Af Amer 55 (L) >60 mL/min    Comment: (NOTE) The eGFR has been calculated using the CKD EPI equation. This calculation has not been validated in all clinical situations. eGFR's persistently <60 mL/min signify possible Chronic Kidney Disease.    Anion gap 7 5 - 15    Imaging: Imaging results have been reviewed  Assessment/Plan:    1. Active Problems: 2.   Carotid stenosis- moderate 2011 3.   Carotid artery narrowing 4.   Time Spent Directly with Patient:  20 minutes  Length of Stay:  LOS: 1 day   POD #1 RICA CAS. Still on low dose Levophed (4ug) but VERY sensitive to taper. This is not uncommon after CAS secondary to vasodepressor reflex from carotid sinus that can be prolonged. Exam benign. Left groin OK. Pt did have a vagal response with sheath pull yesterday. Labs remarkable for increasing SCr (1.12---> 1.52). Will continue to hydrate and monitor. OOB to chair. Keep SBP >120. Hold antihyptertensive meds. Will keep in ICU and monitor , titrate pressors, until BP stabilizes.   Quay Burow 03/29/2015, 7:37 AM

## 2015-03-30 ENCOUNTER — Encounter (HOSPITAL_COMMUNITY): Payer: Self-pay | Admitting: *Deleted

## 2015-03-30 DIAGNOSIS — I9581 Postprocedural hypotension: Secondary | ICD-10-CM

## 2015-03-30 LAB — BASIC METABOLIC PANEL
Anion gap: 9 (ref 5–15)
BUN: 11 mg/dL (ref 6–20)
CALCIUM: 9.1 mg/dL (ref 8.9–10.3)
CO2: 24 mmol/L (ref 22–32)
CREATININE: 1.3 mg/dL — AB (ref 0.61–1.24)
Chloride: 97 mmol/L — ABNORMAL LOW (ref 101–111)
GFR, EST NON AFRICAN AMERICAN: 57 mL/min — AB (ref >60)
Glucose, Bld: 151 mg/dL — ABNORMAL HIGH (ref 65–99)
Potassium: 4.3 mmol/L (ref 3.5–5.1)
SODIUM: 130 mmol/L — AB (ref 135–145)

## 2015-03-30 LAB — GLUCOSE, CAPILLARY
GLUCOSE-CAPILLARY: 139 mg/dL — AB (ref 65–99)
Glucose-Capillary: 134 mg/dL — ABNORMAL HIGH (ref 65–99)
Glucose-Capillary: 210 mg/dL — ABNORMAL HIGH (ref 65–99)
Glucose-Capillary: 140 mg/dL — ABNORMAL HIGH (ref 65–99)
Glucose-Capillary: 143 mg/dL — ABNORMAL HIGH (ref 65–99)

## 2015-03-30 MED ORDER — SODIUM CHLORIDE 0.9 % IV SOLN
Freq: Once | INTRAVENOUS | Status: AC
  Administered 2015-03-30: 11:00:00 via INTRAVENOUS

## 2015-03-30 MED ORDER — PHENYLEPHRINE HCL 10 MG/ML IJ SOLN
0.0000 ug/min | INTRAMUSCULAR | Status: DC
  Administered 2015-03-30: 30 ug/min via INTRAVENOUS
  Administered 2015-03-30: 40 ug/min via INTRAVENOUS
  Administered 2015-03-31: 20 ug/min via INTRAVENOUS
  Filled 2015-03-30 (×6): qty 1

## 2015-03-30 MED ORDER — SODIUM CHLORIDE 0.9 % IV SOLN
INTRAVENOUS | Status: DC
  Administered 2015-03-30 (×2): via INTRAVENOUS

## 2015-03-30 NOTE — Progress Notes (Signed)
Patient Name: Jason [name] Date of Encounter: 03/30/2015  Principal Problem:   Carotid stenosis- moderate 2011, 95% 2016 s/p stent Active Problems:   DM (diabetes mellitus) ([place])   HTN (hypertension)   Carotid artery narrowing   Length of Stay: 2  SUBJECTIVE  Remains hypotensive on IV norepi. Had chest tightness radiating to both arms last night, similar to his old angina, but he thinks it might have been an anxiety attack. Resolved 1 hour after xanax.  CURRENT MEDS . aspirin EC  325 mg Oral Daily  . atorvastatin  40 mg Oral Daily  . clopidogrel  75 mg Oral Q breakfast  . famotidine  20 mg Oral QHS  . insulin aspart  0-15 Units Subcutaneous TID WC  . insulin aspart  0-5 Units Subcutaneous QHS  . loratadine  10 mg Oral Daily  . nicotine  21 mg Transdermal Daily  . ranolazine  1,000 mg Oral BID  . sertraline  50 mg Oral Daily    OBJECTIVE   Intake/Output Summary (Last 24 hours) at 03/30/15 1024 Last data filed at 03/30/15 0900  Gross per 24 hour  Intake 2192.91 ml  Output   3775 ml  Net -1582.09 ml   Filed Weights   03/28/15 0628 03/28/15 1100  Weight: 167 lb (75.751 kg) 165 lb (74.844 kg)    PHYSICAL EXAM Filed Vitals:   03/30/15 0600 03/30/15 0700 03/30/15 0735 03/30/15 0800  BP: 140/82 101/42  112/49  Pulse: 53 56  55  Temp:   97.7 F (36.5 C)   TempSrc:   Oral   Resp:      Height:      Weight:      SpO2: 97% 94%  95%   General: Alert, oriented x3, no distress Head: no evidence of trauma, PERRL, EOMI, no exophtalmos or lid lag, no myxedema, no xanthelasma; normal ears, nose and oropharynx Neck: normal jugular venous pulsations and no hepatojugular reflux; brisk carotid pulses without delay and no carotid bruits Chest: clear to auscultation, no signs of consolidation by percussion or palpation, normal fremitus, symmetrical and full respiratory excursions Cardiovascular: normal position and quality of the apical impulse, regular rhythm, normal first  and second heart sounds, no rubs or gallops, 1/6 early peaking aortic systolic murmur Abdomen: no tenderness or distention, no masses by palpation, no abnormal pulsatility or arterial bruits, normal bowel sounds, no hepatosplenomegaly Extremities: no clubbing, cyanosis or edema; 2+ radial, ulnar and brachial pulses bilaterally; 2+ right femoral, posterior tibial and dorsalis pedis pulses; 2+ left femoral, posterior tibial and dorsalis pedis pulses; no subclavian or femoral bruits Neurological: grossly nonfocal  LABS  CBC  Recent Labs  03/28/15 2052 03/29/15 0822  WBC 11.1* 12.4*  NEUTROABS 8.2*  --   HGB 13.3 13.6  HCT 39.0 40.0  MCV 97.3 97.1  PLT 221 601   Basic Metabolic Panel  Recent Labs  03/29/15 0822 03/30/15 0243  NA 130* 130*  K 4.4 4.3  CL 101 97*  CO2 22 24  GLUCOSE 159* 151*  BUN 14 11  CREATININE 1.38* 1.30*  CALCIUM 9.0 9.1  MG 1.7  --    Liver Function Tests  Recent Labs  03/28/15 2052  AST 18  ALT 19  ALKPHOS 69  BILITOT 0.5  PROT 6.2*  ALBUMIN 3.5     Radiology Studies Imaging results have been reviewed and No results found.  TELE NSR   ASSESSMENT AND PLAN  Post carotid stent vasodepressor related hypotension persists. He may  have had angina last night and a pure vasoconstrictor might be superior to norepi since he has extensive CAD. He is hyponatremic and receiving 0.5% NS. Switch to NS.   Sanda Klein, MD, [facility] [phone] office [phone] pager 03/30/2015 10:24 AM

## 2015-03-31 ENCOUNTER — Inpatient Hospital Stay (HOSPITAL_COMMUNITY): Payer: Self-pay

## 2015-03-31 DIAGNOSIS — I10 Essential (primary) hypertension: Secondary | ICD-10-CM

## 2015-03-31 DIAGNOSIS — R079 Chest pain, unspecified: Secondary | ICD-10-CM

## 2015-03-31 DIAGNOSIS — I2510 Atherosclerotic heart disease of native coronary artery without angina pectoris: Secondary | ICD-10-CM

## 2015-03-31 LAB — BASIC METABOLIC PANEL
Anion gap: 9 (ref 5–15)
BUN: 13 mg/dL (ref 6–20)
CALCIUM: 8.9 mg/dL (ref 8.9–10.3)
CO2: 23 mmol/L (ref 22–32)
CREATININE: 1.34 mg/dL — AB (ref 0.61–1.24)
Chloride: 98 mmol/L — ABNORMAL LOW (ref 101–111)
GFR calc Af Amer: >60 mL/min (ref >60)
GFR calc non Af Amer: 55 mL/min — ABNORMAL LOW (ref >60)
GLUCOSE: 145 mg/dL — AB (ref 65–99)
Potassium: 4.3 mmol/L (ref 3.5–5.1)
Sodium: 130 mmol/L — ABNORMAL LOW (ref 135–145)

## 2015-03-31 LAB — HEMOGLOBIN A1C
HEMOGLOBIN A1C: 6.8 % — AB (ref 4.8–5.6)
Mean Plasma Glucose: 148 mg/dL

## 2015-03-31 LAB — GLUCOSE, CAPILLARY
Glucose-Capillary: 94 mg/dL (ref 65–99)
Glucose-Capillary: 137 mg/dL — ABNORMAL HIGH (ref 65–99)
Glucose-Capillary: 123 mg/dL — ABNORMAL HIGH (ref 65–99)
Glucose-Capillary: 177 mg/dL — ABNORMAL HIGH (ref 65–99)

## 2015-03-31 NOTE — Progress Notes (Signed)
  Echocardiogram 2D Echocardiogram has been performed.  Aggie Cosier 03/31/2015, 1:52 PM

## 2015-03-31 NOTE — Progress Notes (Signed)
Patient Name: Jason [name] Date of Encounter: 03/31/2015  Principal Problem:   Carotid stenosis- moderate 2011, 95% 2016 s/p stent Active Problems:   DM (diabetes mellitus) ([place])   HTN (hypertension)   Carotid artery narrowing   Length of Stay: 3  SUBJECTIVE  Remains hypotensive on IV neo-synephrine, no chest pain, feels better today.   CURRENT MEDS . aspirin EC  325 mg Oral Daily  . atorvastatin  40 mg Oral Daily  . clopidogrel  75 mg Oral Q breakfast  . famotidine  20 mg Oral QHS  . insulin aspart  0-15 Units Subcutaneous TID WC  . insulin aspart  0-5 Units Subcutaneous QHS  . loratadine  10 mg Oral Daily  . nicotine  21 mg Transdermal Daily  . ranolazine  1,000 mg Oral BID  . sertraline  50 mg Oral Daily   . sodium chloride Stopped (03/29/15 1001)  . sodium chloride 50 mL/hr at 03/30/15 2025  . phenylephrine (NEO-SYNEPHRINE) Adult infusion 20 mcg/min (03/31/15 6378)   OBJECTIVE   Intake/Output Summary (Last 24 hours) at 03/31/15 0851 Last data filed at 03/31/15 0600  Gross per 24 hour  Intake 3571.1 ml  Output    450 ml  Net 3121.1 ml   Filed Weights   03/28/15 0628 03/28/15 1100  Weight: 167 lb (75.751 kg) 165 lb (74.844 kg)    PHYSICAL EXAM Filed Vitals:   03/31/15 0530 03/31/15 0600 03/31/15 0630 03/31/15 0755  BP: 97/44 123/52 99/42   Pulse: 50 57 45   Temp:    97.4 F (36.3 C)  TempSrc:    Oral  Resp:      Height:      Weight:      SpO2: 100% 100% 100%    General: Alert, oriented x3, no distress Head: no evidence of trauma, PERRL, EOMI, no exophtalmos or lid lag, no myxedema, no xanthelasma; normal ears, nose and oropharynx Neck: normal jugular venous pulsations and no hepatojugular reflux; brisk carotid pulses without delay and no carotid bruits Chest: clear to auscultation, no signs of consolidation by percussion or palpation, normal fremitus, symmetrical and full respiratory excursions Cardiovascular: normal position and quality of  the apical impulse, regular rhythm, normal first and second heart sounds, no rubs or gallops, 1/6 early peaking aortic systolic murmur Abdomen: no tenderness or distention, no masses by palpation, no abnormal pulsatility or arterial bruits, normal bowel sounds, no hepatosplenomegaly Extremities: no clubbing, cyanosis or edema; 2+ radial, ulnar and brachial pulses bilaterally; 2+ right femoral, posterior tibial and dorsalis pedis pulses; 2+ left femoral, posterior tibial and dorsalis pedis pulses; no subclavian or femoral bruits Neurological: grossly nonfocal  LABS  CBC  Recent Labs  03/28/15 2052 03/29/15 0822  WBC 11.1* 12.4*  NEUTROABS 8.2*  --   HGB 13.3 13.6  HCT 39.0 40.0  MCV 97.3 97.1  PLT 221 588   Basic Metabolic Panel  Recent Labs  03/29/15 0822 03/30/15 0243 03/31/15 0314  NA 130* 130* 130*  K 4.4 4.3 4.3  CL 101 97* 98*  CO2 22 24 23   GLUCOSE 159* 151* 145*  BUN 14 11 13   CREATININE 1.38* 1.30* 1.34*  CALCIUM 9.0 9.1 8.9  MG 1.7  --   --    Liver Function Tests  Recent Labs  03/28/15 2052  AST 18  ALT 19  ALKPHOS 69  BILITOT 0.5  PROT 6.2*  ALBUMIN 3.5   Radiology Studies Imaging results have been reviewed and No results found.  TELE NSR   ASSESSMENT AND PLAN  Post carotid stent vasodepressor related hypotension persists. He may have had angina last night and a pure vasoconstrictor might be superior to norepi since he has extensive CAD. He is hyponatremic and receiving 0.5% NS. Switched to NS. Now 130 = patient's baseline.  The patient states that he has had episodes of low BP associated with significant fatigue prior to the admission.  Repeat echocardiogram (none since 2013).  The goal for today would be to wean off neo-synephrine and continue ambulating.   Dorothy Spark, MD, [place] [phone] office [phone] pager 03/31/2015 8:51 AM

## 2015-04-01 DIAGNOSIS — I6521 Occlusion and stenosis of right carotid artery: Secondary | ICD-10-CM

## 2015-04-01 DIAGNOSIS — G903 Multi-system degeneration of the autonomic nervous system: Secondary | ICD-10-CM

## 2015-04-01 LAB — BASIC METABOLIC PANEL
Anion gap: 9 (ref 5–15)
BUN: 13 mg/dL (ref 6–20)
CALCIUM: 9.3 mg/dL (ref 8.9–10.3)
CO2: 25 mmol/L (ref 22–32)
CREATININE: 1.33 mg/dL — AB (ref 0.61–1.24)
Chloride: 96 mmol/L — ABNORMAL LOW (ref 101–111)
GFR calc Af Amer: >60 mL/min (ref >60)
GFR, EST NON AFRICAN AMERICAN: 56 mL/min — AB (ref >60)
GLUCOSE: 142 mg/dL — AB (ref 65–99)
Potassium: 3.9 mmol/L (ref 3.5–5.1)
SODIUM: 130 mmol/L — AB (ref 135–145)

## 2015-04-01 LAB — GLUCOSE, CAPILLARY
GLUCOSE-CAPILLARY: 153 mg/dL — AB (ref 65–99)
Glucose-Capillary: 175 mg/dL — ABNORMAL HIGH (ref 65–99)
Glucose-Capillary: 92 mg/dL (ref 65–99)
Glucose-Capillary: 168 mg/dL — ABNORMAL HIGH (ref 65–99)

## 2015-04-01 NOTE — Progress Notes (Signed)
Transfer report received from [place] at 1745 and pt arrived to the unit via wheelchair with belongings to the side at 1850. Pt A&O x4; denies any pain, pt oriented to the unit and room; VSS; telemetry applied and verified; pt left groin incision clean, dry and intact with bandaid dsg. Pt in bed comfortably with call light within reach. Will closely monitor and report off to oncoming RN. Francis Gaines Meadow Abramo RN.

## 2015-04-01 NOTE — Progress Notes (Signed)
Patient Name: Jason [name] Date of Encounter: 04/01/2015  Principal Problem:   Carotid stenosis- moderate 2011, 95% 2016 s/p stent Active Problems:   DM (diabetes mellitus) ([place])   HTN (hypertension)   Carotid artery narrowing   Length of Stay: 4  SUBJECTIVE  BP ok, on low dose neo-synephrine, feels well, walking around the room.   CURRENT MEDS . aspirin EC  325 mg Oral Daily  . atorvastatin  40 mg Oral Daily  . clopidogrel  75 mg Oral Q breakfast  . famotidine  20 mg Oral QHS  . insulin aspart  0-15 Units Subcutaneous TID WC  . insulin aspart  0-5 Units Subcutaneous QHS  . loratadine  10 mg Oral Daily  . nicotine  21 mg Transdermal Daily  . ranolazine  1,000 mg Oral BID  . sertraline  50 mg Oral Daily   . sodium chloride Stopped (03/29/15 1001)  . phenylephrine (NEO-SYNEPHRINE) Adult infusion 10 mcg/min (04/01/15 0600)   OBJECTIVE   Intake/Output Summary (Last 24 hours) at 04/01/15 0835 Last data filed at 04/01/15 0600  Gross per 24 hour  Intake 1691.38 ml  Output      0 ml  Net 1691.38 ml   Filed Weights   03/28/15 0628 03/28/15 1100  Weight: 167 lb (75.751 kg) 165 lb (74.844 kg)    PHYSICAL EXAM Filed Vitals:   04/01/15 0405 04/01/15 0500 04/01/15 0600 04/01/15 0630  BP: 102/43 102/44 98/42 118/40  Pulse:      Temp: 97.7 F (36.5 C)     TempSrc: Oral     Resp: 16     Height:      Weight:      SpO2: 95%      General: Alert, oriented x3, no distress Head: no evidence of trauma, PERRL, EOMI, no exophtalmos or lid lag, no myxedema, no xanthelasma; normal ears, nose and oropharynx Neck: normal jugular venous pulsations and no hepatojugular reflux; brisk carotid pulses without delay and no carotid bruits Chest: clear to auscultation, no signs of consolidation by percussion or palpation, normal fremitus, symmetrical and full respiratory excursions Cardiovascular: normal position and quality of the apical impulse, regular rhythm, normal first and  second heart sounds, no rubs or gallops, 1/6 early peaking aortic systolic murmur Abdomen: no tenderness or distention, no masses by palpation, no abnormal pulsatility or arterial bruits, normal bowel sounds, no hepatosplenomegaly Extremities: no clubbing, cyanosis or edema; 2+ radial, ulnar and brachial pulses bilaterally; 2+ right femoral, posterior tibial and dorsalis pedis pulses; 2+ left femoral, posterior tibial and dorsalis pedis pulses; no subclavian or femoral bruits Neurological: grossly nonfocal   Recent Labs  03/31/15 0314 04/01/15 0550  NA 130* 130*  K 4.3 3.9  CL 98* 96*  CO2 23 25  GLUCOSE 145* 142*  BUN 13 13  CREATININE 1.34* 1.33*  CALCIUM 8.9 9.3    TELE NSR   ASSESSMENT AND PLAN  Post carotid stent vasodepressor related hypotension persists. He may have had angina last night and a pure vasoconstrictor might be superior to norepi since he has extensive CAD. He is hyponatremic and receiving 0.5% NS. Switched to NS. Now 130 = patient's baseline.  The patient states that he has had episodes of low BP associated with significant fatigue prior to the admission.  Repeat echocardiogram shows normal LVEF 60-65%.  He feels well, we will discontinue neo-synephrine, if he tolerates it, we will transfer him to telemetry.   Dorothy Spark, MD, [place] [phone] office (  [phone] pager 04/01/2015 8:35 AM

## 2015-04-02 LAB — GLUCOSE, CAPILLARY
GLUCOSE-CAPILLARY: 135 mg/dL — AB (ref 65–99)
GLUCOSE-CAPILLARY: 204 mg/dL — AB (ref 65–99)
Glucose-Capillary: 108 mg/dL — ABNORMAL HIGH (ref 65–99)
Glucose-Capillary: 101 mg/dL — ABNORMAL HIGH (ref 65–99)

## 2015-04-02 NOTE — Significant Event (Signed)
Paged to evaluate patient for c/o right facial, right arm, right leg numbness.  Sx started an hour ago. Patient reported he developed these symptoms with HA. He has had left sided numbness in the past in the setting of anxiety but does not usually have right sided sx. He says his speech feels different.   On exam, the patient reports objective sensory deficit of right cheek, right foot, and right hand. No numbness of prox right arm or prox right leg. CN intact. No objective weakness of upper or lower extremities. His speech appears normal, although I have not met him before now. He is in NAD and appears comfortable sitting in a chair.  Impression Numbness of unclear etiology, w/o objective weakness Extremity numbness is ipsilateral to facial numbness which would be atypical for CVA/TIA No weakness in distribution of numbness, also atypical for CVA/TIA Patient has had numbness as manifestation of anxiety in past, although this should be a dx of exclusion, particularly in someone with recent CAS  Plan  Continue to monitor Reassurance provided to patient Could neuro eval in AM or earlier if sx worsen

## 2015-04-02 NOTE — Care Management Note (Signed)
Case Management Note CM note started by Donne Anon RNCM  Patient Details  Name: Jason [name] MRN: [id number] Date of Birth: December 23, 1950  Subjective/Objective:   Adm w carotid stenosis, hypotension                 Action/Plan: lives alone, pcp dr Remo Lipps daub   Expected Discharge Date:     04/03/15             Expected Discharge Plan:  Home/Self Care  In-House Referral:     Discharge planning Services  CM Consult, Medication Assistance  Post Acute Care Choice:    Choice offered to:     DME Arranged:    DME Agency:     HH Arranged:    HH Agency:     Status of Service:  In process, will continue to follow  Medicare Important Message Given:    Date Medicare IM Given:    Medicare IM give by:    Date Additional Medicare IM Given:    Additional Medicare Important Message give by:     If discussed at [place] of Stay Meetings, dates discussed:    Additional Comments: ur review done  04/02/15- Marvetta Gibbons RN, BSN - pt tx from ICU over weekend- noted referral for possible medication assistance- spoke with pt at bedside who states that he gets meds [facility]- for most part does not have difficulty uses a discount card with [facility], lexapro is a bit expensive he states but his most expensive drug that he pays out of pocket for is [facility]- pt states he pays about $400/mo for this drug as he does not have insurance- gave pt the pt connect # for pt assistance- also assisted pt with copay card on line- to see if this might help pt with cost for drug- copay card printed for pt to take to [facility] with him- pt to call [employer] pt connect if copay card does not offer enough assistance to see what further assistance drug company may can offer. Pt voices understanding of this and states he appreciates the help. NCM to continue to follow for any further assistance needed. Pt does report that he has applied for disability.   Dawayne Patricia, RN 04/02/2015, 3:31 PM

## 2015-04-02 NOTE — Progress Notes (Signed)
Patient Name: Jason [name] Date of Encounter: 04/02/2015  Primary    Principal Problem:   Carotid stenosis- moderate 2011, 95% 2016 s/p stent Active Problems:   DM (diabetes mellitus) ([place])   HTN (hypertension)   Carotid artery narrowing    SUBJECTIVE  Denies any CP or SOB. Last CP was few days ago  CURRENT MEDS . aspirin EC  325 mg Oral Daily  . atorvastatin  40 mg Oral Daily  . clopidogrel  75 mg Oral Q breakfast  . famotidine  20 mg Oral QHS  . insulin aspart  0-15 Units Subcutaneous TID WC  . insulin aspart  0-5 Units Subcutaneous QHS  . loratadine  10 mg Oral Daily  . nicotine  21 mg Transdermal Daily  . ranolazine  1,000 mg Oral BID  . sertraline  50 mg Oral Daily    OBJECTIVE  Filed Vitals:   04/01/15 1700 04/01/15 1847 04/01/15 1928 04/02/15 0441  BP: 96/41 126/55 115/52 112/65  Pulse:  79 60 73  Temp:  97.9 F (36.6 C) 97.9 F (36.6 C) 98.7 F (37.1 C)  TempSrc:  Oral Oral Oral  Resp:  18 18 18   Height:      Weight:      SpO2:  100% 98% 96%    Intake/Output Summary (Last 24 hours) at 04/02/15 0910 Last data filed at 04/01/15 0934  Gross per 24 hour  Intake   23.5 ml  Output      0 ml  Net   23.5 ml   Filed Weights   03/28/15 0628 03/28/15 1100  Weight: 167 lb (75.751 kg) 165 lb (74.844 kg)    PHYSICAL EXAM  General: Pleasant, NAD. Neuro: Alert and oriented X 3. Moves all extremities spontaneously. Psych: Normal affect. HEENT:  Normal  Neck: Supple without bruits or JVD. Lungs:  Resp regular and unlabored, CTA. Heart: RRR no s3, s4, or murmurs. Abdomen: Soft, non-tender, non-distended, BS + x 4.  Extremities: No clubbing, cyanosis or edema. DP/PT/Radials 2+ and equal bilaterally.  Accessory Clinical Findings  Basic Metabolic Panel  Recent Labs  03/31/15 0314 04/01/15 0550  NA 130* 130*  K 4.3 3.9  CL 98* 96*  CO2 23 25  GLUCOSE 145* 142*  BUN 13 13  CREATININE 1.34* 1.33*  CALCIUM 8.9 9.3    TELE NSR      ECG  No new EKG  Echocardiogram 03/31/2015  LV EF: 60% -  65%  ------------------------------------------------------------------- Indications:   Chest pain 786.51.  ------------------------------------------------------------------- History:  PMH:  Coronary artery disease. Risk factors: Hypertension.  ------------------------------------------------------------------- Study Conclusions  - Left ventricle: The cavity size was normal. Wall thickness was increased in a pattern of mild LVH. Systolic function was normal. The estimated ejection fraction was in the range of 60% to 65%. Basal inferoposterior hypokinesis to akinesis. Doppler parameters are consistent with abnormal left ventricular relaxation (grade 1 diastolic dysfunction). The E/e&' ratio is >20, suggesting markedly elevated LV filling pressure. - Aortic valve: Trileaflet; mildly calcified leaflets. There was no stenosis. There was no regurgitation. - Mitral valve: Calcified annulus. Mildly thickened leaflets . There was mild regurgitation. - Left atrium: The atrium was normal in size. - Right ventricle: The cavity size was normal. Wall thickness was normal. Systolic function is low normal. - Right atrium: Moderately dilated. - Atrial septum: A patent foramen ovale cannot be excluded. - Inferior vena cava: The vessel was normal in size. The respirophasic diameter changes were in the normal range (=  50%), consistent with normal central venous pressure.  Impressions:  - Compared to a prior echo in 2013, the EF has improved to 60-65%. There is basal inferoposterior hypokinesis to akinesis - possibly suggesting prior infarct in this territory. Diastolic dysfunction with elevated LV filling pressure is noted.     Radiology/Studies  Dg Chest 2 View  03/10/2015   CLINICAL DATA:  Shortness of breath.  Smoker.  EXAM: CHEST  2 VIEW  COMPARISON:  01/16/2014  FINDINGS: There is no  focal parenchymal opacity. There is no pleural effusion or pneumothorax. The heart and mediastinal contours are unremarkable. There is evidence of prior CABG.  The osseous structures are unremarkable.  IMPRESSION: No active cardiopulmonary disease.   Electronically Signed   By: Kathreen Devoid   On: 03/10/2015 11:41    ASSESSMENT AND PLAN  1. Post carotid stent vasodepressor related hypotension  - neosynephrine weaned off. BP stablizing.   - continue ASA, plavix, statin. Imdur 60mg  and lisinopril 10mg  on hold.   - likely discharge tomorrow if SBP stable  2. Carotid stenosis  - carotid angiography 03/28/2015 occluded L ICA at its origin, 95% calcified R carotid artery filling R and L intracranial circulation, R carotid artery treated with distal protection and Xact Nitrol soft expanding stent (7/86mm x 3 cm).  - per [name], will need carotid doppler in several weeks and followup with [name]   3. Chest pain: 1 episode of chest pain on the night after procedure, wasn't sure if it was angina vs anxiety attack, improved after xanax, no recurrence since.  - Echo 03/31/2015 EF 60-65%, basal inferoposterior hypokinesis to akinesis (possible suggesting prior infarct), grade 1 diastolic dysfunction, mild MR  - if has recurrent CP, would do myoview  4. Acute on chronic renal insufficiency: improved after BP stabilized  5. Chronic hyponatremia: now Na back to baseline 130  6. CAD s/p CABG LIMA to LAD, SVG to marginal, ocluded SVG to PDA and PLA 7. HTN 8. DM  Signed, Almyra Deforest PA-C Pager: [phone]

## 2015-04-02 NOTE — Progress Notes (Signed)
PATIENT COMPLAINING OF NUMBNESS TO RIGHT FOREARM AND HAND, RIGHT FOOT, AND RIGHT FACE. STATES THAT IT STARTED ABOUT AN HOUR AGO AND THAT THIS SOMETIMES OCCURS AT HOME.  GRIPS EQUAL AND STRONG. PERRLA (3MM, BRISK), A&Ox4. VITALS STABLE.  ON-CALL NOTIFIED.

## 2015-04-03 DIAGNOSIS — R202 Paresthesia of skin: Secondary | ICD-10-CM

## 2015-04-03 DIAGNOSIS — G451 Carotid artery syndrome (hemispheric): Secondary | ICD-10-CM

## 2015-04-03 LAB — BASIC METABOLIC PANEL
Anion gap: 10 (ref 5–15)
BUN: 16 mg/dL (ref 6–20)
CALCIUM: 9.7 mg/dL (ref 8.9–10.3)
CHLORIDE: 96 mmol/L — AB (ref 101–111)
CO2: 26 mmol/L (ref 22–32)
CREATININE: 1.52 mg/dL — AB (ref 0.61–1.24)
GFR calc non Af Amer: 47 mL/min — ABNORMAL LOW (ref >60)
GFR, EST AFRICAN AMERICAN: 55 mL/min — AB (ref >60)
GLUCOSE: 122 mg/dL — AB (ref 65–99)
Potassium: 4.4 mmol/L (ref 3.5–5.1)
Sodium: 132 mmol/L — ABNORMAL LOW (ref 135–145)

## 2015-04-03 LAB — GLUCOSE, CAPILLARY
GLUCOSE-CAPILLARY: 189 mg/dL — AB (ref 65–99)
Glucose-Capillary: 135 mg/dL — ABNORMAL HIGH (ref 65–99)
Glucose-Capillary: 126 mg/dL — ABNORMAL HIGH (ref 65–99)
Glucose-Capillary: 195 mg/dL — ABNORMAL HIGH (ref 65–99)

## 2015-04-03 NOTE — Progress Notes (Signed)
Utilization review completed.  

## 2015-04-03 NOTE — Consult Note (Signed)
Referring Physician: Gwenlyn Found    Chief Complaint: right face, arm and foot numbness  HPI:                                                                                                                                         Jason [name] is an 64 y.o. male with multiple stroke risk factors. Patient underwent a cardiac cath on 03-28-15.  Post op he did have hypotension with systolic BP in the 25'E.  For 5 days after patient had no issues. On 04-02-15 he noted his right hand,arm and lower face felt heavy and had decreased sensation. This AM his symptoms have improved but not fully resolved. He currently has ulnar distribution decreased sensation and decreased sensation on the top protion of his foot laterally--all on the right side.  He did have a right sided HA yesterday but this has resolved.   Of note--he has high grade stenosis of his RIC but this would not corrilate with symptoms since they are on the right.   He does admit to having tingling sensations when he has panic attacks but usually on the left.  HE also noted he was not having a panic attack yesterday.    Currently he is comfortable.   He is on Plavix at home, but currently on ASA and Plavix.   03-31-15 echo--60-65% with basal inferoposterior hypo-akinesis 02-28-15 CUS---80-99% ostial RICA stenosis, with post-stenotic dilatation and turbulence.  Occluded LICA, age undetermined.  patent vertebral arteries with antegrade flow. Normal subclavian arteries, bilaterally.  Date last known well: Date: 04/02/2015 Time last known well: Time: 19:00 tPA Given: No: out of window    Past Medical History  Diagnosis Date  . Coronary artery disease     CABG 2/11, cath x 4 since - med Rx  . Hypertension     type 2 NIDDM  . Diabetes mellitus   . Arthritis   . GERD (gastroesophageal reflux disease)   . High cholesterol   . Myocardial infarct (HCC)     x4 last 10 yrs  . Anxiety     off xanax  and paxil since 3/13  . COPD (chronic  obstructive pulmonary disease) ([place])   . Pneumonia     hx  . Cancer ([place])     tumor basal cell rem from lft arm  . Smoker   . 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 Depression   . Substance abuse   . Cataract   . Heart attack ([place])   . Bilateral carotid artery disease [facility])     Past Surgical History  Procedure Laterality Date  . Femoral artery - femoral artery bypass graft Right 2004    femoral enarterectomy  . US extremity*l*      lft arm tumor removed   . Cardiac catheterization  4/12    Medical Rx  . Coronary  angioplasty  Jan 2004    RCA  . Back surgery  Jan 2014, Dec 2011    Dr Vertell Limber  . Coronary artery bypass graft  07/24/2009    L-LAD, SVG-RI/OM, SVG-PDA  . Eye surgery      cat bil  . Coronary angiogram  01/17/14    med rx  . Coronary angiogram  4/13    med Rx  . Coronary angiogram  1/15    Med Rx  . Left heart catheterization with coronary angiogram N/A 10/03/2011    Procedure: LEFT HEART CATHETERIZATION WITH CORONARY ANGIOGRAM;  Surgeon: Pixie Casino, MD;  Location: [facility] CATH LAB;  Service: Cardiovascular;  Laterality: N/A;  . Left heart catheterization with coronary angiogram N/A 07/08/2013    Procedure: LEFT HEART CATHETERIZATION WITH CORONARY ANGIOGRAM;  Surgeon: Blane Ohara, MD;  Location: [facility] CATH LAB;  Service: Cardiovascular;  Laterality: N/A;  . Left heart catheterization with coronary/graft angiogram N/A 01/17/2014    Procedure: LEFT HEART CATHETERIZATION WITH Beatrix Fetters;  Surgeon: Troy Sine, MD;  Location: [facility] CATH LAB;  Service: Cardiovascular;  Laterality: N/A;  . Peripheral vascular catheterization N/A 03/28/2015    Procedure: Carotid PTA/Stent Intervention;  Surgeon: Lorretta Harp, MD;  Location: [place] CV LAB;  Service: Cardiovascular;  Laterality: N/A;    Family History  Problem Relation Age of Onset  . Arthritis Mother   . Heart disease Father    Social History:  reports that he has  been smoking Cigarettes.  He has a 40 pack-year smoking history. He has never used smokeless tobacco. He reports that he drinks alcohol. He reports that he does not use illicit drugs.  Allergies:  Allergies  Allergen Reactions  . Coreg [Carvedilol] Nausea And Vomiting and Other (See Comments)    Per patient made him dizzy and light sensitive  . Sulfa Antibiotics Nausea And Vomiting and Other (See Comments)    Also headaches     Medications:                                                                                                                           Prior to Admission:  Prescriptions prior to admission  Medication Sig Dispense Refill Last Dose  . acetaminophen (TYLENOL) 325 MG tablet Take 2 tablets (650 mg total) by mouth every 4 (four) hours as needed for headache or mild pain.   Past Month at Unknown time  . ALPRAZolam (XANAX) 0.5 MG tablet Take 0.5-1 tablets (0.25-0.5 mg total) by mouth 2 (two) times daily as needed for anxiety. Or panic episode. 60 tablet 3 03/28/2015 at Unknown time  . atorvastatin (LIPITOR) 40 MG tablet Take 1 tablet (40 mg total) by mouth daily. 30 tablet 6 03/27/2015 at Unknown time  . clopidogrel (PLAVIX) 75 MG tablet Take 1 tablet (75 mg total) by mouth daily. 90 tablet 3 03/27/2015 at Unknown time  . isosorbide mononitrate (IMDUR) 60 MG 24 hr tablet Take 1 tablet (60  mg total) by mouth daily. 90 tablet 3 03/27/2015 at 1100  . lisinopril (PRINIVIL,ZESTRIL) 10 MG tablet Take 10 mg by mouth daily.   03/27/2015 at Unknown time  . loratadine (CLARITIN) 10 MG tablet Take 10 mg by mouth daily.   03/27/2015 at Unknown time  . naproxen sodium (ANAPROX) 220 MG tablet Take 440 mg by mouth 2 (two) times daily as needed (for pain).   03/27/2015 at Unknown time  . ranitidine (ZANTAC) 150 MG tablet Take 150 mg by mouth daily as needed for heartburn. For acid reflux   03/26/2015  . ranolazine (RANEXA) 1000 MG SR tablet Take 1 tablet (1,000 mg total) by mouth 2 (two) times  daily. 60 tablet 6 03/28/2015 at Unknown time  . sertraline (ZOLOFT) 50 MG tablet Take 50 mg by mouth daily.   03/27/2015 at Unknown time  . nitroGLYCERIN (NITROSTAT) 0.4 MG SL tablet Place 1 tablet (0.4 mg total) under the tongue every 5 (five) minutes as needed for chest pain. 25 tablet 2 More than a month at Unknown time   Scheduled: . aspirin EC  325 mg Oral Daily  . atorvastatin  40 mg Oral Daily  . clopidogrel  75 mg Oral Q breakfast  . famotidine  20 mg Oral QHS  . insulin aspart  0-15 Units Subcutaneous TID WC  . insulin aspart  0-5 Units Subcutaneous QHS  . loratadine  10 mg Oral Daily  . nicotine  21 mg Transdermal Daily  . ranolazine  1,000 mg Oral BID  . sertraline  50 mg Oral Daily    ROS:                                                                                                                                       History obtained from the patient  General ROS: negative for - chills, fatigue, fever, night sweats, weight gain or weight loss Psychological ROS: negative for - behavioral disorder, hallucinations, memory difficulties, mood swings or suicidal ideation Ophthalmic ROS: negative for - blurry vision, double vision, eye pain or loss of vision ENT ROS: negative for - epistaxis, nasal discharge, oral lesions, sore throat, tinnitus or vertigo Allergy and Immunology ROS: negative for - hives or itchy/watery eyes Hematological and Lymphatic ROS: negative for - bleeding problems, bruising or swollen lymph nodes Endocrine ROS: negative for - galactorrhea, hair pattern changes, polydipsia/polyuria or temperature intolerance Respiratory ROS: negative for - cough, hemoptysis, shortness of breath or wheezing Cardiovascular ROS: negative for - chest pain, dyspnea on exertion, edema or irregular heartbeat Gastrointestinal ROS: negative for - abdominal pain, diarrhea, hematemesis, nausea/vomiting or stool incontinence Genito-Urinary ROS: negative for - dysuria, hematuria,  incontinence or urinary frequency/urgency Musculoskeletal ROS: negative for - joint swelling or muscular weakness Neurological ROS: as noted in HPI Dermatological ROS: negative for rash and skin lesion changes  Neurologic Examination:  Blood pressure 123/63, pulse 52, temperature 98 F (36.7 C), temperature source Oral, resp. rate 20, height 5\' 9"  (1.753 m), weight 74.844 kg (165 lb), SpO2 99 %.  HEENT-  Normocephalic, no lesions, without obvious abnormality.  Normal external eye and conjunctiva.  Normal TM's bilaterally.  Normal auditory canals and external ears. Normal external nose, mucus membranes and septum.  Normal pharynx. Cardiovascular- S1, S2 normal, pulses palpable throughout   Lungs- chest clear, no wheezing, rales, normal symmetric air entry Abdomen- normal findings: bowel sounds normal Extremities- no edema Lymph-no adenopathy palpable Musculoskeletal-no joint tenderness, deformity or swelling Skin-warm and dry, no hyperpigmentation, vitiligo, or suspicious lesions  Neurological Examination Mental Status: Alert, oriented, thought content appropriate.  Speech fluent without evidence of aphasia.  Able to follow 3 step commands without difficulty. Cranial Nerves: II: Discs flat bilaterally; Visual fields grossly normal, pupils equal, round, reactive to light and accommodation III,IV, VI: ptosis not present, extra-ocular motions intact bilaterally V,VII: smile symmetric, facial light touch sensation normal bilaterally VIII: hearing normal bilaterally IX,X: uvula rises symmetrically XI: bilateral shoulder shrug XII: midline tongue extension Motor: Right : Upper extremity   5/5    Left:     Upper extremity   5/5  Lower extremity   5/5     Lower extremity   5/5 Tone and bulk:normal tone throughout; no atrophy noted Sensory: Pinprick and light touch intact throughout,  bilaterally Deep Tendon Reflexes: 2+ and symmetric throughout UE, right LE 3+ (post lumbar surgery) and left LE 2+.  Plantars: Right: upgoing   Left: downgoing Cerebellar: normal finger-to-nose, and normal heel-to-shin test Gait: not tested due to patient safety.        Lab Results: Basic Metabolic Panel:  Recent Labs Lab 03/28/15 2052 03/29/15 0822 03/30/15 0243 03/31/15 0314 04/01/15 0550  NA 127* 130* 130* 130* 130*  K 5.1 4.4 4.3 4.3 3.9  CL 97* 101 97* 98* 96*  CO2 23 22 24 23 25   GLUCOSE 162* 159* 151* 145* 142*  BUN 18 14 11 13 13   CREATININE 1.52* 1.38* 1.30* 1.34* 1.33*  CALCIUM 8.8* 9.0 9.1 8.9 9.3  MG  --  1.7  --   --   --     Liver Function Tests:  Recent Labs Lab 03/28/15 2052  AST 18  ALT 19  ALKPHOS 69  BILITOT 0.5  PROT 6.2*  ALBUMIN 3.5   No results for input(s): LIPASE, AMYLASE in the last 168 hours. No results for input(s): AMMONIA in the last 168 hours.  CBC:  Recent Labs Lab 03/28/15 2052 03/29/15 0822  WBC 11.1* 12.4*  NEUTROABS 8.2*  --   HGB 13.3 13.6  HCT 39.0 40.0  MCV 97.3 97.1  PLT 221 229    Cardiac Enzymes: No results for input(s): CKTOTAL, CKMB, CKMBINDEX, TROPONINI in the last 168 hours.  Lipid Panel: No results for input(s): CHOL, TRIG, HDL, CHOLHDL, VLDL, LDLCALC in the last 168 hours.  CBG:  Recent Labs Lab 04/02/15 1136 04/02/15 1624 04/02/15 2103 04/03/15 0558 04/03/15 1042  GLUCAP 101* 135* 204* 135* 126*    Microbiology: Results for orders placed or performed during the hospital encounter of 03/28/15  MRSA PCR Screening     Status: None   Collection Time: 03/28/15  1:41 PM  Result Value Ref Range Status   MRSA by PCR NEGATIVE NEGATIVE Final    Comment:        The GeneXpert MRSA Assay (FDA approved for NASAL specimens only), is one component of  a comprehensive MRSA colonization surveillance program. It is not intended to diagnose MRSA infection nor to guide or monitor treatment  for MRSA infections.     Coagulation Studies: No results for input(s): LABPROT, INR in the last 72 hours.  Imaging: No results found.     Assessment and plan discussed with with attending physician and they are in agreement.    Etta Quill PA-C [facility] [phone]  04/03/2015, 10:59 AM   Assessment: 64 y.o. male hx of CAD, HTN, DM, HLD, MI presenting with new onset right face, arm and foot numbness. HE has known RIC stenosis but this would not correlate with right sided symptoms. Much hof his symptoms have resolved with residual ulnar distribution and dorsum of foot numbness. Given his risk factors cannot rule out TIA/CVA. HE has had a recent CUS, Echo and these would not need to be repeated. He is currently on ASA and Plavix. A1c is 6.8.    Stroke Risk Factors - carotid stenosis, diabetes mellitus, hyperlipidemia, hypertension and smoking   Recommend: 1) LDL 2) MRI/MRA head if possible. --patient feels he cannot due to carotid stents.  If unable to perform would obtain head CT. 3) continue ASA and Plavix regime.  4) Cessation of smoking.   5) If symptoms continue and MRI is unremarkable consider EMG/NCV as out patient.    Jim Like, DO [facility] [phone]  If 7pm- 7am, please page neurology on call as listed in [place].

## 2015-04-03 NOTE — Progress Notes (Signed)
         Subjective: Numbness last pm and persistent.   Objective: Vital signs in last 24 hours: Temp:  [98 F (36.7 C)-98.1 F (36.7 C)] 98 F (36.7 C) (10/11 0433) Pulse Rate:  [52-70] 52 (10/11 0433) Resp:  [18-24] 20 (10/11 0433) BP: (91-132)/(50-63) 123/63 mmHg (10/11 0433) SpO2:  [96 %-100 %] 99 % (10/11 0433) Weight change:  Last BM Date: 04/01/15 Intake/Output from previous day: + 480 10/10 0701 - 10/11 0700 In: 480 [P.O.:480] Out: -  Intake/Output this shift:    PE: General:Pleasant affect, NAD Skin:Warm and dry, brisk capillary refill HEENT:normocephalic, sclera clear, mucus membranes moist Heart:S1S2 RRR without murmur, gallup, rub or click Lungs:clear without rales, rhonchi, or wheezes KGM:WNUU, non tender, + BS, do not palpate liver spleen or masses Ext:no lower ext edema, 2+ pedal pulses, 2+ radial pulses Neuro:alert and oriented, MAE, follows commands, + facial symmetry Tele:  SR   Lab Results: No results for input(s): WBC, HGB, HCT, PLT in the last 72 hours. BMET  Recent Labs  04/01/15 0550  NA 130*  K 3.9  CL 96*  CO2 25  GLUCOSE 142*  BUN 13  CREATININE 1.33*  CALCIUM 9.3   No results for input(s): TROPONINI in the last 72 hours.  Invalid input(s): CK, MB  Lab Results  Component Value Date   CHOL 178 01/17/2014   HDL 36* 01/17/2014   LDLCALC 93 01/17/2014   TRIG 245* 01/17/2014   CHOLHDL 4.9 01/17/2014   Lab Results  Component Value Date   HGBA1C 6.8* 03/29/2015     Lab Results  Component Value Date   TSH 3.542 09/26/2014     Studies/Results: No results found.  Medications: I have reviewed the patient's current medications. Scheduled Meds: . aspirin EC  325 mg Oral Daily  . atorvastatin  40 mg Oral Daily  . clopidogrel  75 mg Oral Q breakfast  . famotidine  20 mg Oral QHS  . insulin aspart  0-15 Units Subcutaneous TID WC  . insulin aspart  0-5 Units Subcutaneous QHS  . loratadine  10 mg Oral Daily  . nicotine   21 mg Transdermal Daily  . ranolazine  1,000 mg Oral BID  . sertraline  50 mg Oral Daily   Continuous Infusions: . sodium chloride Stopped (03/29/15 1001)  . phenylephrine (NEO-SYNEPHRINE) Adult infusion Stopped (04/01/15 0934)   PRN Meds:.acetaminophen, ALPRAZolam, loperamide, morphine injection, nitroGLYCERIN, ondansetron (ZOFRAN) IV  Assessment/Plan: Principal Problem:   Carotid stenosis- moderate 2011, 95% 2016 s/p stent RICA with hypotension post procedure and increase Cr.    -- now with rt hand numbness that persists ? Neuro consult- discussed with Dr. Adora Fridge as well.  Labs pending.  hope for discharge later today.   Active Problems:   DM (diabetes mellitus) ([place])- stable to mildly elevated    HTN (hypertension)- on low side, once to 91 systolic last pm    Carotid artery narrowing   Tobacco use- discuss need to stop.   CAD s/p CABG 2011 and occluded VG-PDA-PLA - no chest pain.   LOS: 6 days   Time spent with pt. :15 minutes. [facility]  Nurse Practitioner Certified Pager [phone] or after 5pm and on weekends call [phone] 04/03/2015, 8:58 AM

## 2015-04-03 NOTE — Progress Notes (Addendum)
       Patient Name: Jason [name] Date of Encounter: 04/03/2015    SUBJECTIVE: Right face, arm, and leg numbness last night lasting 1 hour. Has resolved by this AM. Known to have an occluded left carotid.  TELEMETRY:  NSR Filed Vitals:   04/02/15 2112 04/02/15 2249 04/02/15 2251 04/03/15 0433  BP: 91/51 132/55 121/54 123/63  Pulse: 62 58 54 52  Temp: 98.1 F (36.7 C)   98 F (36.7 C)  TempSrc: Oral   Oral  Resp: 18 24  20   Height:      Weight:      SpO2: 97% 100%  99%    Intake/Output Summary (Last 24 hours) at 04/03/15 0959 Last data filed at 04/03/15 0941  Gross per 24 hour  Intake    720 ml  Output      0 ml  Net    720 ml   LABS: Basic Metabolic Panel:  Recent Labs  04/01/15 0550  NA 130*  K 3.9  CL 96*  CO2 25  GLUCOSE 142*  BUN 13  CREATININE 1.33*  CALCIUM 9.3   Radiology/Studies:  No new data  Physical Exam: Blood pressure 123/63, pulse 52, temperature 98 F (36.7 C), temperature source Oral, resp. rate 20, height 5\' 9"  (1.753 m), weight 165 lb (74.844 kg), SpO2 99 %. Weight change:   Wt Readings from Last 3 Encounters:  03/28/15 165 lb (74.844 kg)  03/18/15 168 lb (76.204 kg)  03/10/15 168 lb (76.204 kg)    No focal deficit by my exam.  ASSESSMENT:  1. S/p Right carotis stent 2. Known total occlusion of LICA 3. Overnight transient right hemianesthesia.  Plan:  1. BMET 2. Ambulate 3. After speaking with [name] will get neuro consult.  Demetrios Isaacs 04/03/2015, 9:59 AM

## 2015-04-03 NOTE — Progress Notes (Signed)
i checked with Dr. Adora Fridge and it is ok to proceed with MRI

## 2015-04-03 NOTE — Progress Notes (Signed)
Patient sitting up in chair watching television. No distress or pain. Call light within reach.

## 2015-04-04 ENCOUNTER — Inpatient Hospital Stay (HOSPITAL_COMMUNITY): Payer: Self-pay

## 2015-04-04 ENCOUNTER — Encounter (HOSPITAL_COMMUNITY): Payer: Self-pay | Admitting: Physician Assistant

## 2015-04-04 ENCOUNTER — Telehealth: Payer: Self-pay | Admitting: Cardiovascular Disease

## 2015-04-04 ENCOUNTER — Ambulatory Visit (HOSPITAL_COMMUNITY): Payer: Self-pay

## 2015-04-04 DIAGNOSIS — F17200 Nicotine dependence, unspecified, uncomplicated: Secondary | ICD-10-CM

## 2015-04-04 DIAGNOSIS — I63411 Cerebral infarction due to embolism of right middle cerebral artery: Secondary | ICD-10-CM

## 2015-04-04 DIAGNOSIS — I9589 Other hypotension: Secondary | ICD-10-CM

## 2015-04-04 DIAGNOSIS — E785 Hyperlipidemia, unspecified: Secondary | ICD-10-CM

## 2015-04-04 DIAGNOSIS — R208 Other disturbances of skin sensation: Secondary | ICD-10-CM

## 2015-04-04 DIAGNOSIS — I959 Hypotension, unspecified: Secondary | ICD-10-CM

## 2015-04-04 DIAGNOSIS — I639 Cerebral infarction, unspecified: Secondary | ICD-10-CM

## 2015-04-04 DIAGNOSIS — E1159 Type 2 diabetes mellitus with other circulatory complications: Secondary | ICD-10-CM

## 2015-04-04 DIAGNOSIS — E871 Hypo-osmolality and hyponatremia: Secondary | ICD-10-CM

## 2015-04-04 LAB — GLUCOSE, CAPILLARY
GLUCOSE-CAPILLARY: 156 mg/dL — AB (ref 65–99)
GLUCOSE-CAPILLARY: 160 mg/dL — AB (ref 65–99)
Glucose-Capillary: 151 mg/dL — ABNORMAL HIGH (ref 65–99)

## 2015-04-04 LAB — LIPID PANEL
CHOL/HDL RATIO: 4 ratio
Cholesterol: 136 mg/dL (ref 0–200)
HDL: 34 mg/dL — AB (ref >40)
LDL Cholesterol: 72 mg/dL (ref 0–99)
Triglycerides: 148 mg/dL (ref <150)
VLDL: 30 mg/dL (ref 0–40)

## 2015-04-04 MED ORDER — ASPIRIN 325 MG PO TBEC: 325.0000 mg | DELAYED_RELEASE_TABLET | Freq: Every day | ORAL | Status: DC

## 2015-04-04 NOTE — Progress Notes (Signed)
VASCULAR LAB PRELIMINARY  PRELIMINARY  PRELIMINARY  PRELIMINARY  Carotid duplex  completed.    Preliminary report:  Right:  ICA stent is patent with 50-75% stenosis in the low end of the range.  Left:  ICA remains occluded.  Bilateral:  Vertebral artery flow is antegrade.    Nilay Mangrum, RVT 04/04/2015, 1:17 PM

## 2015-04-04 NOTE — Progress Notes (Signed)
Patient to MRI. Stated that he was claustrophobic and requested Xanax.  Patient stable and no signs of distress on departure.

## 2015-04-04 NOTE — Discharge Summary (Signed)
Discharge Summary   Patient ID: Jason [name],  MRN: [id number], DOB/AGE: [birth year] 64 y.o.  Admit date: 03/28/2015 Discharge date: 04/04/2015  Primary Care Provider: Nena Jordan A Primary Cardiologist: [name] PV Specialist: [name]  Discharge Diagnoses Principal Problem:   Carotid stenosis- moderate 2011, 95% 2016 s/p stent Active Problems:   DM (diabetes mellitus) ([place])   HTN (hypertension)   Carotid artery narrowing   Hemispheric carotid artery syndrome   Hyponatremia   Hypotension   Allergies Allergies  Allergen Reactions  . Coreg [Carvedilol] Nausea And Vomiting and Other (See Comments)    Per patient made him dizzy and light sensitive  . Sulfa Antibiotics Nausea And Vomiting and Other (See Comments)    Also headaches     Procedures  Carotid stent 03/28/2015  Procedures Performed: 1. Aortic arch angiography 2. Selective right and left carotid angiography both intra-and extracranial venous 3. Carotid artery stenting using distal protection right internal carotid artery  Final Impression: successful right internal carotid artery PTA and stenting using a nitinol self expanding stent and distal carotid embolic protection in the setting of an occluded contralateral carotid artery and high risk coronary anatomy. The patient tolerated the procedure well. The sheath was then secured and the bivalirudin was discontinued. The patient left the lab in stable condition neurologically and hemodynamically on a low-dose dopamine drip. The sheath will be removed for several hours and pressure held. The patient will be hydrated overnight and discharged home in the morning on aspirin and Plavix. Follow-up carotid Dopplers performed in our [place] office in several weeks. I will see him back in the office shortly thereafter for outpatient medical follow-up.    Echo 03/31/2015   LV EF: 60% -   65%  ------------------------------------------------------------------- Indications:   Chest pain 786.51.  ------------------------------------------------------------------- History:  PMH:  Coronary artery disease. Risk factors: Hypertension.  ------------------------------------------------------------------- Study Conclusions  - Left ventricle: The cavity size was normal. Wall thickness was increased in a pattern of mild LVH. Systolic function was normal. The estimated ejection fraction was in the range of 60% to 65%. Basal inferoposterior hypokinesis to akinesis. Doppler parameters are consistent with abnormal left ventricular relaxation (grade 1 diastolic dysfunction). The E/e&' ratio is >20, suggesting markedly elevated LV filling pressure. - Aortic valve: Trileaflet; mildly calcified leaflets. There was no stenosis. There was no regurgitation. - Mitral valve: Calcified annulus. Mildly thickened leaflets . There was mild regurgitation. - Left atrium: The atrium was normal in size. - Right ventricle: The cavity size was normal. Wall thickness was normal. Systolic function is low normal. - Right atrium: Moderately dilated. - Atrial septum: A patent foramen ovale cannot be excluded. - Inferior vena cava: The vessel was normal in size. The respirophasic diameter changes were in the normal range (= 50%), consistent with normal central venous pressure.  Impressions:  - Compared to a prior echo in 2013, the EF has improved to 60-65%. There is basal inferoposterior hypokinesis to akinesis - possibly suggesting prior infarct in this territory. Diastolic dysfunction with elevated LV filling pressure is noted.   MRI of brain  IMPRESSION: MRI HEAD: Acute 11 x 4 mm RIGHT parietal lobe infarct (watershed territory).  Moderate global parenchymal brain volume loss. Moderate white matter changes compatible with chronic small vessel ischemic  disease.  Small areas of biparietal and LEFT occipital lobe encephalomalacia may be post traumatic or post ischemic.  MRA HEAD: LEFT internal carotid artery occlusion could be acute. Patent anterior communicating artery.  Moderate luminal irregularity and  stenosis RIGHT carotid siphon most compatible with atherosclerosis.  Slow flow versus possible high-grade stenosis LEFT V4 segment.  Recommend CTA of the head and neck for further characterization.   Carotid Doppler 04/04/2015  Carotid duplex completed.   Preliminary report: Right: ICA stent is patent with 50-75% stenosis in the low end of the range. Left: ICA remains occluded. Bilateral: Vertebral artery flow is antegrade.      Hospital Course  The patient is a 64 year old Caucasian male with PMH of stage III CKD, tobacco abuse, DM, HTN, HLD and CAD s/p CABG LIMA to LAD, SVG to marginal, ocluded SVG to PDA and PLA. He has been referred by [name] to [name] for evaluation because of high grade asymptomatic bilateral carotid artery stenosis. Carotid Doppler performed on 02/28/2015 revealed occluded left internal carotid artery and high-grade right ICA stenosis. After discussing various options, he agreed to undergo revascularization procedure with carotid stent. He was started on 75 mg Plavix. He underwent the planned procedure on 03/28/2015 which showed occluded L ICA at its origin, 95% calcified R carotid artery filling R and L intracranial circulation, R carotid artery treated with distal protection and Xact Nitrol soft expanding stent (7/44mm x 3 cm). Postprocedure, he had persistent hypotension and also one episode of chest pain. The chest pain resolved without recurrence, it was planned if he has recurrent chest pain, we would do a myovoew. His blood pressure medication was discontinued to allow blood pressure slowly drift up. It was felt hypotension is due to vasopressor related hypotension. He was started on  Neo-Synephrine to help with blood pressure which eventually was weaned off. He has significant hyponatremia and received 0.5% NS with Na improve to 130 which is his baseline. Echocardiogram was done on 03/31/2015 which showed EF 60-65%, basal inferoposterior hypokinesis to akinesis, possibly suggesting an infarct in this territory. On the night of 04/02/2015, patient started having right facial, right arm, and right leg numbness. Originally, it was planned to continue to monitor, and obtain neural evaluation in the morning if symptoms worsen. After discussing with [name], it was decided to obtain formal neuro consult.  Neurology was consulted in the morning of 04/03/2015, it was noted his symptom has improved, however not fully resolved. MRI was obtained which showed acute 11 x 4 mm right parietal lobe infarct in the watershed territory, small areas of biparietal and left occipital lobe encephalomalacia which may be post traumatic or postischemic, moderate global parenchymal brain volume loss, moderate white matter changes compatible with chronic small vessel ischemic disease. MRI of the neck showed left internal carotid artery occlusion, moderate luminal irregularities and stenosis of right carotid system siphon most compatible with atherosclerosis. Carotid vascular ultrasound was obtained on 10/12 by stroke team which showed occluded L ICA and 50-75% stenosis in the low end of the range. Patient has been cleared by stroke service and cardiology for discharge. Given new onset of stroke, we will continue to hold his blood pressure medication to allow systolic blood pressure around 120 to 150s range per recommendation by neurology. He has been encouraged to obtain daily blood pressure to avoid hypotension. If hypotension does occur, he should lay down to allow blood pressure to drift up. I have arranged seven-day transition of care follow-up with the cardiology service. Per neurology, patient will follow-up with  stroke service as outpatient in 6 weeks.    Discharge Vitals Blood pressure 129/60, pulse 65, temperature 97.7 F (36.5 C), temperature source Oral, resp. rate 18, height 5'  9" (1.753 m), weight 165 lb (74.844 kg), SpO2 100 %.  Filed Weights   03/28/15 0628 03/28/15 1100  Weight: 167 lb (75.751 kg) 165 lb (74.844 kg)    Labs  Basic Metabolic Panel  Recent Labs  04/03/15 1100  NA 132*  K 4.4  CL 96*  CO2 26  GLUCOSE 122*  BUN 16  CREATININE 1.52*  CALCIUM 9.7   Fasting Lipid Panel  Recent Labs  04/04/15 0645  CHOL 136  HDL 34*  LDLCALC 72  TRIG 148  CHOLHDL 4.0    Disposition  Pt is being discharged home today in good condition.  Follow-up Plans & Appointments      Follow-up Information    Follow up with [facility] R, NP On 04/11/2015.   Specialties:  Cardiology, Radiology   Why:  11:30am. Note followup is at [facility] location   Contact information:   [address] [place] [address] [phone]       Follow up with Quay Burow, MD On 05/23/2015.   Specialties:  Cardiology, Radiology   Why:  1:45pm.    Contact information:   [address] [place] [address] [phone]       Follow up with Troy Sine, MD On 05/07/2015.   Specialty:  Cardiology   Why:  2:30pm.   Contact information:   [address] [place] [address] [phone]       Follow up with Xu,Jindong, MD.   Specialty:  Neurology   Why:  Office will contact you to arrange neurology followup, if you do not hear from the scheduler in 2 business days, please give neurology a call to arrange 4-6 weeks followup   Contact information:   [address]  [id number] [phone]       Discharge Medications    Medication List    STOP taking these medications        isosorbide mononitrate 60 MG 24 hr tablet  Commonly known as:  IMDUR     lisinopril 10 MG tablet  Commonly known as:   PRINIVIL,ZESTRIL      TAKE these medications        acetaminophen 325 MG tablet  Commonly known as:  TYLENOL  Take 2 tablets (650 mg total) by mouth every 4 (four) hours as needed for headache or mild pain.     ALPRAZolam 0.5 MG tablet  Commonly known as:  XANAX  Take 0.5-1 tablets (0.25-0.5 mg total) by mouth 2 (two) times daily as needed for anxiety. Or panic episode.     aspirin 325 MG EC tablet  Take 1 tablet (325 mg total) by mouth daily.     atorvastatin 40 MG tablet  Commonly known as:  LIPITOR  Take 1 tablet (40 mg total) by mouth daily.     clopidogrel 75 MG tablet  Commonly known as:  PLAVIX  Take 1 tablet (75 mg total) by mouth daily.     loratadine 10 MG tablet  Commonly known as:  CLARITIN  Take 10 mg by mouth daily.     naproxen sodium 220 MG tablet  Commonly known as:  ANAPROX  Take 440 mg by mouth 2 (two) times daily as needed (for pain).     nitroGLYCERIN 0.4 MG SL tablet  Commonly known as:  NITROSTAT  Place 1 tablet (0.4 mg total) under the tongue every 5 (five) minutes as needed for chest pain.     ranitidine  150 MG tablet  Commonly known as:  ZANTAC  Take 150 mg by mouth daily as needed for heartburn. For acid reflux     ranolazine 1000 MG SR tablet  Commonly known as:  RANEXA  Take 1 tablet (1,000 mg total) by mouth 2 (two) times daily.     sertraline 50 MG tablet  Commonly known as:  ZOLOFT  Take 50 mg by mouth daily.          Duration of Discharge Encounter   Greater than 30 minutes including physician time.  Hilbert Corrigan PA-C Pager: [phone] 04/04/2015, 5:09 PM

## 2015-04-04 NOTE — Discharge Instructions (Signed)
Please monitor your blood pressure at home on a daily basis, per neurology, would prefer your systolic blood pressure to be around 120-150s. We are currently holding all of your blood pressure medication  Also need to inform family to seek medical attention if you have worsening altered mental status.

## 2015-04-04 NOTE — Telephone Encounter (Signed)
7 day TOC per [facility]  10/19 @ 1130 w/ Mickel Baas @ [address]

## 2015-04-04 NOTE — Plan of Care (Signed)
Problem: Consults Goal: Ischemic Stroke Patient Education See Patient Education Module for education specifics. Outcome: Progressing Pt given handouts on stroke, s/sx, when to get call for help, reducing risk factors.   Problem: Progression Outcomes Goal: Progressive activity as tolerated Outcome: Completed/Met Date Met:  04/04/15 Ambulates in hall independently Goal: Educational plan initiated Outcome: Completed/Met Date Met:  04/04/15 Handouts given

## 2015-04-04 NOTE — Progress Notes (Signed)
   Persistent right had and leg numbness.  Plan per neuro. If felt stable after re-eval and looking at MRI, will discharge.

## 2015-04-04 NOTE — Progress Notes (Signed)
STROKE TEAM PROGRESS NOTE   HISTORY Jason [name] is an 64 y.o. male with multiple stroke risk factors. Patient underwent a cardiac cath on 03-28-15. Post op he did have hypotension with systolic BP in the 02'I. For 5 days after patient had no issues. On 04-02-15 at 1900 (LKW) he noted his right hand,arm and lower face felt heavy and had decreased sensation. On the am of 04/03/15, symptoms have improved but not fully resolved. He currently has ulnar distribution decreased sensation and decreased sensation on the top protion of his foot laterally--all on the right side. He did have a right sided HA yesterday but this has resolved.   Of note--he has high grade stenosis of his RIC but this would not corrilate with symptoms since they are on the right.   He does admit to having tingling sensations when he has panic attacks but usually on the left. HE also noted he was not having a panic attack yesterday.   He is on Plavix at home, but currently on ASA and Plavix.   03-31-15 echo--60-65% with basal inferoposterior hypo-akinesis 02-28-15 CUS---80-99% ostial RICA stenosis, with post-stenotic dilatation and turbulence. Occluded LICA, age undetermined. patent vertebral arteries with antegrade flow. Normal subclavian arteries, bilaterally.  Patient was not administered TPA at time of neuro consult secondary to being outside the window.    SUBJECTIVE (INTERVAL HISTORY) No family is at the bedside.  Overall he feels his condition is nearly resolved. He stated that his right sided numbness is much better, not feeling much numbness today. His MRI showed right MCA/ACA punctate infarct, not correlated to his symptoms. However, his BP up and down, as low as 90s and this am is in 120s.   OBJECTIVE Temp:  [97.5 F (36.4 C)-98 F (36.7 C)] 98 F (36.7 C) (10/12 0328) Pulse Rate:  [66-76] 66 (10/12 0328) Cardiac Rhythm:  [-] Normal sinus rhythm (10/12 0707) Resp:  [16-18] 18 (10/12 0328) BP:  (94-125)/(49-69) 125/69 mmHg (10/12 0328) SpO2:  [96 %-100 %] 100 % (10/12 0328)  CBC:  Recent Labs Lab 03/28/15 2052 03/29/15 0822  WBC 11.1* 12.4*  NEUTROABS 8.2*  --   HGB 13.3 13.6  HCT 39.0 40.0  MCV 97.3 97.1  PLT 221 097    Basic Metabolic Panel:  Recent Labs Lab 03/29/15 0822  04/01/15 0550 04/03/15 1100  NA 130*  < > 130* 132*  K 4.4  < > 3.9 4.4  CL 101  < > 96* 96*  CO2 22  < > 25 26  GLUCOSE 159*  < > 142* 122*  BUN 14  < > 13 16  CREATININE 1.38*  < > 1.33* 1.52*  CALCIUM 9.0  < > 9.3 9.7  MG 1.7  --   --   --   < > = values in this interval not displayed.  Lipid Panel:    Component Value Date/Time   CHOL 136 04/04/2015 0645   TRIG 148 04/04/2015 0645   HDL 34* 04/04/2015 0645   CHOLHDL 4.0 04/04/2015 0645   VLDL 30 04/04/2015 0645   LDLCALC 72 04/04/2015 0645   HgbA1c:  Lab Results  Component Value Date   HGBA1C 6.8* 03/29/2015   Urine Drug Screen:    Component Value Date/Time   LABOPIA NONE DETECTED 07/08/2013 0226   COCAINSCRNUR NONE DETECTED 07/08/2013 0226   LABBENZ NONE DETECTED 07/08/2013 0226   AMPHETMU NONE DETECTED 07/08/2013 0226   THCU NONE DETECTED 07/08/2013 0226   LABBARB NONE DETECTED 07/08/2013  0226      IMAGING I have personally reviewed the radiological images below and agree with the radiology interpretations.  MRI HEAD 04/04/2015    Acute 11 x 4 mm RIGHT parietal lobe infarct (watershed territory). Moderate global parenchymal brain volume loss. Moderate white matter changes compatible with chronic small vessel ischemic disease. Small areas of biparietal and LEFT occipital lobe encephalomalacia may be post traumatic or post ischemic.   MRA HEAD 04/04/2015   LEFT internal carotid artery occlusion could be acute. Patent anterior communicating artery. Moderate luminal irregularity and stenosis RIGHT carotid siphon most compatible with atherosclerosis. Slow flow versus possible high-grade stenosis LEFT V4 segment.   2D  Echocardiogram   - Left ventricle: The cavity size was normal. Wall thickness wasincreased in a pattern of mild LVH. Systolic function was normal.The estimated ejection fraction was in the range of 60% to 65%.Basal inferoposterior hypokinesis to akinesis. Doppler parametersare consistent with abnormal left ventricular relaxation (grade 1diastolic dysfunction). The E/e&' ratio is >20, suggestingmarkedly elevated LV filling pressure. - Aortic valve: Trileaflet; mildly calcified leaflets. There was nostenosis. There was no regurgitation. - Mitral valve: Calcified annulus. Mildly thickened leaflets . There was mild regurgitation. - Left atrium: The atrium was normal in size. - Right ventricle: The cavity size was normal. Wall thickness wasnormal. Systolic function is low normal. - Right atrium: Moderately dilated. - Atrial septum: A patent foramen ovale cannot be excluded. - Inferior vena cava: The vessel was normal in size. The respirophasic diameter changes were in the normal range (= 50%),consistent with normal central venous pressure. Impressions:  Compared to a prior echo in 2013, the EF has improved to 60-65%.There is basal inferoposterior hypokinesis to akinesis - possiblysuggesting prior infarct in this territory. Diastolic dysfunctionwith elevated LV filling pressure is noted.  CUS 02/28/15 - 80-99% ostial RICA stenosis, with post-stenotic dilatation and turbulence. Occluded LICA, age undetermined. Patent vertebral arteries with antegrade flow. Normal subclavian arteries, bilaterally.  CUS 04/04/15 - Right: ICA stent is patent with 50-75% stenosis in the low end of the range. Left: ICA remains occluded. Bilateral: Vertebral artery flow is antegrade.  Cerebral angio - Carotid artery stenting using distal protection right internal carotid artery.  Component     Latest Ref Rng 03/29/2015 04/04/2015  Cholesterol     0 - 200 mg/dL  136  Triglycerides     <150 mg/dL  148   HDL Cholesterol     >40 mg/dL  34 (L)  Total CHOL/HDL Ratio       4.0  VLDL     0 - 40 mg/dL  30  LDL (calc)     0 - 99 mg/dL  72  Hemoglobin A1C     4.8 - 5.6 % 6.8 (H)   Mean Plasma Glucose      148     PHYSICAL EXAM Physical exam  Temp:  [97.7 F (36.5 C)-98 F (36.7 C)] 97.7 F (36.5 C) (10/12 1331) Pulse Rate:  [65-76] 65 (10/12 1331) Resp:  [16-18] 18 (10/12 1331) BP: (94-129)/(50-69) 129/60 mmHg (10/12 1331) SpO2:  [96 %-100 %] 100 % (10/12 1331)  General - Well nourished, well developed, in no apparent distress.  Ophthalmologic - Sharp disc margins OU.   Cardiovascular - Regular rate and rhythm with no murmur.  Mental Status -  Level of arousal and orientation to time, place, and person were intact. Language including expression, naming, repetition, comprehension was assessed and found intact. Fund of Knowledge was assessed and was intact.  Cranial  Nerves II - XII - II - Visual field intact OU. III, IV, VI - Extraocular movements intact. V - Facial sensation intact bilaterally. VII - Facial movement intact bilaterally. VIII - Hearing & vestibular intact bilaterally. X - Palate elevates symmetrically. XI - Chin turning & shoulder shrug intact bilaterally. XII - Tongue protrusion intact.  Motor Strength - The patient's strength was normal in all extremities and pronator drift was absent.  Bulk was normal and fasciculations were absent.   Motor Tone - Muscle tone was assessed at the neck and appendages and was normal.  Reflexes - The patient's reflexes were 1+ in all extremities and he had no pathological reflexes.  Sensory - Light touch, temperature/pinprick were assessed and were symmetrical.    Coordination - The patient had normal movements in the hands and feet with no ataxia or dysmetria.  Tremor was absent.  Gait and Station - The patient's transfers, posture, gait, station, and turns were observed as normal.  ASSESSMENT/PLAN Mr. Jason [name] is a 64 y.o. male with history of CAD, hypertension, hyperlipidemia, COPD, cigarette smoker, carotid disease who underwent carotid stenting on 03/28/2015. Post op developed hypotension due to sensitivity of carotid sinus. 5 days later developed R numbness hand, foot and face during admission.  He did not receive IV t-PA at time of neuro consult as out of the window.   Stroke:  Right parietal watershed small infarct secondary to carotid procedure. However, this is not the cause of his right sided hemiparesthesia. His right side symptoms likely due to hypoperfusion secondary to hypotension with sensitive carotid sinus after carotid stent. BP control is key for him.  Resultant  Resolution of symptoms  MRI  R small MCA/ACA watershed infarct  MRA  Chronic L ICA occlusion, R ICA atherosclerotic irregularity  Carotid Doppler post right ICA stent 50-75% stenosis, left ICA occluded  2D Echo  unremarkable    LDL 72  HgbA1c 6.8  SCDs ordered for VTE prophylaxis  Diet Carb Modified Fluid consistency:: Thin; Room service appropriate?: Yes  clopidogrel 75 mg orally every day prior to admission, now on aspirin 325 mg orally every day and clopidogrel 75 mg orally every day. Continue dual antiplatelet.  Patient counseled to be compliant with his antithrombotic medications  Ongoing aggressive stroke risk factor management  Right carotid Stenosis  S/p R ICA stent 2016 for 95% stenosis with post procedure hypotension  Known total occlusion L ICA  Repeat carotid doppler showed right ICA 50-75% stenosis, left occluded  Avoid hypotension  BP goal 120-150   Hx Hypertension, Hypotension post R ICA stent BP 94-125/50-69 past 24h (04/04/2015 @ 8:59 AM) Due to sensitivity of carotid sinus after ICA stent Avoid hypotension BP goal 120-150 Check Bp at home and record and follow up with PCP closely  Hyperlipidemia  Home meds:  lipitor 40, resumed in hospital  LDL 72, goal < 70  Continue  lipitor 40  Continue statin at discharge  Tobacco abuse  Current smoker  Smoking cessation counseling provided  Pt is willing to quit  Other Stroke Risk Factors  ETOH use  Substance abuse  Coronary artery disease - s/p CABG 2011, MI x 4 last 10 yrs  Other Active Problems  Acute on chronic renal insufficiency  Chronic hyponatremia  CP  Hospital day # 7  Neurology will sign off. Please call with questions. Pt will follow up with Dr. Erlinda Hong at [facility] in about 2 months. Thanks for the consult.  Rosalin Hawking, MD PhD Stroke  Neurology 04/04/2015 5:10 PM      To contact Stroke Continuity provider, please refer to [email or web address] After hours, contact General Neurology

## 2015-04-05 DIAGNOSIS — R200 Anesthesia of skin: Secondary | ICD-10-CM | POA: Insufficient documentation

## 2015-04-06 NOTE — Telephone Encounter (Signed)
Patient contacted regarding discharge from [facility] on 04/04/15.  Patient understands to follow up with provider Mickel Baas, NP on 04/11/15 at 11:30pm at [facility]. Patient understands discharge instructions? YES Patient understands medications and regiment? YES Patient understands to bring all medications to this visit? YES  Reports bottom half of hand is still numb - he can tell when he tries to write something  >> had mild stroke in hospital   Patient is on the way to get a home BP cuff.   Patient reports the nurses at the hospital were great.

## 2015-04-11 ENCOUNTER — Encounter: Payer: Self-pay | Admitting: Cardiology

## 2015-04-17 ENCOUNTER — Encounter: Payer: Self-pay | Admitting: Cardiology

## 2015-04-17 ENCOUNTER — Ambulatory Visit (INDEPENDENT_AMBULATORY_CARE_PROVIDER_SITE_OTHER): Payer: Self-pay | Admitting: Cardiology

## 2015-04-17 VITALS — BP 130/70 | HR 73 | Ht 69.0 in | Wt 169.0 lb

## 2015-04-17 DIAGNOSIS — I10 Essential (primary) hypertension: Secondary | ICD-10-CM

## 2015-04-17 DIAGNOSIS — I6521 Occlusion and stenosis of right carotid artery: Secondary | ICD-10-CM

## 2015-04-17 DIAGNOSIS — I9589 Other hypotension: Secondary | ICD-10-CM

## 2015-04-17 DIAGNOSIS — I209 Angina pectoris, unspecified: Secondary | ICD-10-CM

## 2015-04-17 DIAGNOSIS — Z720 Tobacco use: Secondary | ICD-10-CM

## 2015-04-17 DIAGNOSIS — I25810 Atherosclerosis of coronary artery bypass graft(s) without angina pectoris: Secondary | ICD-10-CM

## 2015-04-17 DIAGNOSIS — I6523 Occlusion and stenosis of bilateral carotid arteries: Secondary | ICD-10-CM

## 2015-04-17 DIAGNOSIS — I63039 Cerebral infarction due to thrombosis of unspecified carotid artery: Secondary | ICD-10-CM

## 2015-04-17 NOTE — Progress Notes (Signed)
Cardiology Office Note   Date:  04/17/2015   ID:  Jason [name], DOB 1951-05-22, MRN [id number]  PCP:  Jason Reichmann, MD  Cardiologist:  Dr. Claiborne [name] PV : Dr. Gwenlyn [name]    Chief Complaint  Patient presents with  . Hospitalization Follow-up    carotid stents, CVA      History of Present Illness: Jason [name] is a 64 y.o. male who presents for post hospital s/p carotid stenosis with stent placed to Rt carotid.  Post procedure pt with hypotension requiring neo,  due to vasopressor related hypotension.   He had significant hyponatremia and received 0.5% NS with Na improve to 130 which is his baseline. Echocardiogram was done on 03/31/2015 which showed EF 60-65%, basal inferoposterior hypokinesis to akinesis, possibly suggesting an infarct in this territory. He developed Rt arm and hand numbness and lower rt face. Neuro consulted and MRI of head with acute 11 x 4 mm RIGHT parietal lobe infarct (watershed [place]).  His blood pressure medication held to allow systolic blood pressure around 120 to 150s range per recommendation by neurology.   CUS 04/04/15 - Right: ICA stent is patent with 50-75% stenosis in the low end of the range. Left: ICA remains occluded. Bilateral: Vertebral artery flow is antegrade  PMH of stage III CKD, tobacco abuse, DM, HTN, HLD and CAD s/p CABG LIMA to LAD, SVG to marginal, ocluded SVG to PDA and PLA. + tobacco 10-12 per day, cutting back.  Today no chest pain no SOB.  No residual except some numbness in 4&5th fingers on the rt.  When he was playing guitar those fingers were tired after a while.   BP is within parameters.  He monitors it twice a day at home.  He will call if > 150 for more than a day.  Past Medical History  Diagnosis Date  . Coronary artery disease     CABG 2/11, cath x 4 since - med Rx  . Hypertension     type 2 NIDDM  . Diabetes mellitus   . Arthritis   . GERD (gastroesophageal reflux disease)   . High cholesterol   .  Myocardial infarct (HCC)     x4 last 10 yrs  . Anxiety     off xanax  and paxil since 3/13  . COPD (chronic obstructive pulmonary disease) ([place])   . Pneumonia     hx  . Cancer ([place])     tumor basal cell rem from lft arm  . Smoker   . 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 Depression   . Substance abuse   . Cataract   . Heart attack ([place])   . Bilateral carotid artery disease ([place])     s/p R ICA stent 03/28/2015 with distal protection. Known chronically occluded L ICA. Carotid stent complicated by hypotension and acute stroke in watershed territory  . CVA (cerebral infarction)     occured on 10/10 several days after R ICA carotid stenting    Past Surgical History  Procedure Laterality Date  . Femoral artery - femoral artery bypass graft Right 2004    femoral enarterectomy  . US extremity*l*      lft arm tumor removed   . Cardiac catheterization  4/12    Medical Rx  . Coronary angioplasty  Jan 2004    RCA  . Back surgery  Jan 2014, Dec 2011    Dr Vertell Limber  . Coronary artery bypass graft  07/24/2009    L-LAD, SVG-RI/OM, SVG-PDA  . Eye surgery      cat bil  . Coronary angiogram  01/17/14    med rx  . Coronary angiogram  4/13    med Rx  . Coronary angiogram  1/15    Med Rx  . Left heart catheterization with coronary angiogram N/A 10/03/2011    Procedure: LEFT HEART CATHETERIZATION WITH CORONARY ANGIOGRAM;  Surgeon: Pixie Casino, MD;  Location: [facility] CATH LAB;  Service: Cardiovascular;  Laterality: N/A;  . Left heart catheterization with coronary angiogram N/A 07/08/2013    Procedure: LEFT HEART CATHETERIZATION WITH CORONARY ANGIOGRAM;  Surgeon: Blane Ohara, MD;  Location: [facility] CATH LAB;  Service: Cardiovascular;  Laterality: N/A;  . Left heart catheterization with coronary/graft angiogram N/A 01/17/2014    Procedure: LEFT HEART CATHETERIZATION WITH Beatrix Fetters;  Surgeon: Troy Sine, MD;  Location: [facility] CATH LAB;  Service:  Cardiovascular;  Laterality: N/A;  . Peripheral vascular catheterization N/A 03/28/2015    Procedure: Carotid PTA/Stent Intervention;  Surgeon: Lorretta Harp, MD;  Location: [place] CV LAB;  Service: Cardiovascular;  Laterality: N/A;     Current Outpatient Prescriptions  Medication Sig Dispense Refill  . acetaminophen (TYLENOL) 325 MG tablet Take 2 tablets (650 mg total) by mouth every 4 (four) hours as needed for headache or mild pain.    Marland Kitchen ALPRAZolam (XANAX) 0.5 MG tablet Take 0.5-1 tablets (0.25-0.5 mg total) by mouth 2 (two) times daily as needed for anxiety. Or panic episode. 60 tablet 3  . aspirin EC 325 MG EC tablet Take 1 tablet (325 mg total) by mouth daily.    Marland Kitchen atorvastatin (LIPITOR) 40 MG tablet Take 1 tablet (40 mg total) by mouth daily. 30 tablet 6  . clopidogrel (PLAVIX) 75 MG tablet Take 1 tablet (75 mg total) by mouth daily. 90 tablet 3  . loratadine (CLARITIN) 10 MG tablet Take 10 mg by mouth daily.    . naproxen sodium (ANAPROX) 220 MG tablet Take 440 mg by mouth 2 (two) times daily as needed (for pain).    . nitroGLYCERIN (NITROSTAT) 0.4 MG SL tablet Place 1 tablet (0.4 mg total) under the tongue every 5 (five) minutes as needed for chest pain. 25 tablet 2  . ranitidine (ZANTAC) 150 MG tablet Take 150 mg by mouth daily as needed for heartburn. For acid reflux    . ranolazine (RANEXA) 1000 MG SR tablet Take 1 tablet (1,000 mg total) by mouth 2 (two) times daily. 60 tablet 6  . sertraline (ZOLOFT) 50 MG tablet Take 50 mg by mouth daily.     No current facility-administered medications for this visit.    Allergies:   Coreg and Sulfa antibiotics    Social History:  The patient  reports that he has been smoking Cigarettes.  He has a 40 pack-year smoking history. He has never used smokeless tobacco. He reports that he drinks alcohol. He reports that he does not use illicit drugs.   Family History:  The patient's family history includes Arthritis in his mother; Heart  disease in his father.    ROS:  General:no colds or fevers, no weight changes Skin:no rashes or ulcers HEENT:no blurred vision, no congestion CV:see HPI PUL:see HPI GI:no diarrhea constipation or melena, no indigestion GU:no hematuria, no dysuria MS:no joint pain, no claudication Neuro:no syncope, no lightheadedness Endo:no diabetes, no thyroid disease  Wt Readings from Last 3 Encounters:  04/17/15 169 lb (76.658 kg)  03/28/15 165 lb (74.844  kg)  03/18/15 168 lb (76.204 kg)     PHYSICAL EXAM: VS:  BP 130/70 mmHg  Pulse 73  Ht 5\' 9"  (1.753 m)  Wt 169 lb (76.658 kg)  BMI 24.95 kg/m2 , BMI Body mass index is 24.95 kg/(m^2). General:Pleasant affect, NAD Skin:Warm and dry, brisk capillary refill HEENT:normocephalic, sclera clear, mucus membranes moist Neck:supple, no JVD, no bruits  Heart:S1S2 RRR without murmur, gallup, rub or click Lungs:clear without rales, rhonchi, or wheezes TDS:KAJG, non tender, + BS, do not palpate liver spleen or masses Ext:no lower ext edema, 2+ pedal pulses, 2+ radial pulses Neuro:alert and oriented, MAE, follows commands, + facial symmetry, grips are equal and strong.  Finger strength equal and strong.     EKG:  EKG is ordered today. The ekg ordered today demonstrates SR RBBB no acute changes.    Recent Labs: 09/26/2014: TSH 3.542 03/28/2015: ALT 19 03/29/2015: Hemoglobin 13.6; Magnesium 1.7; Platelets 229 04/03/2015: BUN 16; Creatinine, Ser 1.52*; Potassium 4.4; Sodium 132*    Lipid Panel    Component Value Date/Time   CHOL 136 04/04/2015 0645   TRIG 148 04/04/2015 0645   HDL 34* 04/04/2015 0645   CHOLHDL 4.0 04/04/2015 0645   VLDL 30 04/04/2015 0645   LDLCALC 72 04/04/2015 0645       Other studies Reviewed: Additional studies/ records that were reviewed today include: hospital notes, echo, carotids, MRI. Marland Kitchen   ASSESSMENT AND PLAN:  1.  Hypotension, continue to hold BP meds has appt with Dr. Claiborne [name] in Nov and with Dr. Gwenlyn [name].  He  monitors his BPs at home and will call when BP elevated. He continues ranexa.  2. CVA post carotid stent.  Improved, he will do hand exercises at home. He will follow with neuro in a couple of weeks.  3. Carotid stenosis 100% on Lt, new stent to RICA with doppler -Right: 50-69% stenosis of the stent in the low end of the range.  Vertebral artery flow is antegrade. Elevated vertebral velocities may be due to compensatory flow.  Left: ICA remains occluded from just past the bifurcation.  Vertebral artery flow is antegrade.  On ASA and Plavix.   He will follow with Dr. Gwenlyn [name]  4.  CAD s/p CABG LIMA to LAD, SVG to marginal, ocluded SVG to PDA and PLA.  5. DM-2 followed by PCP  6. Hyponatremia  Will recheck BMP  7. Tobacco use - discussed decreasing to 5 a day, form 10.  Then continue to decrease to 2 or less a day.  Current medicines are reviewed with the patient today.  The patient Has no concerns regarding medicines.  The following changes have been made:  See above Labs/ tests ordered today include:see above  Disposition:   FU:  see above  Signed, Isaiah Serge, NP  04/17/2015 9:39 AM    [facility] [place], [place], [place], [place] Phone: [phone]; Fax: [phone]

## 2015-04-17 NOTE — Patient Instructions (Addendum)
Medication Instructions:  Your physician recommends that you continue on your current medications as directed. Please refer to the Current Medication list given to you today.   Labwork: None ordered  Testing/Procedures: None ordered  Follow-Up: Your physician recommends that you keep your scheduled follow-up appointments with DR. BERRY & [name]    Any Other Special Instructions Will Be Listed Below (If Applicable).  1.  Please do some exercises with your right hand 2.  Please call our office if your blood pressure is running 150 or higher for more than 2 days  If you need a refill on your cardiac medications before your next appointment, please call your pharmacy.

## 2015-04-18 ENCOUNTER — Other Ambulatory Visit: Payer: Self-pay | Admitting: *Deleted

## 2015-04-18 ENCOUNTER — Telehealth: Payer: Self-pay | Admitting: *Deleted

## 2015-04-18 DIAGNOSIS — E871 Hypo-osmolality and hyponatremia: Secondary | ICD-10-CM

## 2015-04-18 NOTE — Telephone Encounter (Signed)
Left pt a message to return my call to let him know that Cecilie Kicks, NP, wants him to come and have a BMP done.  Order is in [place].

## 2015-04-18 NOTE — Telephone Encounter (Signed)
-----   Message from Isaiah Serge, NP sent at 04/17/2015  5:31 PM EDT ----- Please have pt have BMP for hyponatremia,  Thank you.

## 2015-04-19 NOTE — Telephone Encounter (Signed)
Called pt back and advised him that we needed him to come to the office and some labs drawn.  Pt stated that he had to take his mother to dr today and it would be tomorrow.  I advised pt that is fine.  Pt verbalized understanding.

## 2015-05-07 ENCOUNTER — Ambulatory Visit: Payer: Self-pay | Admitting: Cardiovascular Disease

## 2015-05-09 ENCOUNTER — Ambulatory Visit (INDEPENDENT_AMBULATORY_CARE_PROVIDER_SITE_OTHER): Payer: Self-pay | Admitting: Cardiovascular Disease

## 2015-05-09 VITALS — BP 144/72 | HR 95 | Ht 69.0 in | Wt 169.7 lb

## 2015-05-09 DIAGNOSIS — I4510 Unspecified right bundle-branch block: Secondary | ICD-10-CM

## 2015-05-09 DIAGNOSIS — I2510 Atherosclerotic heart disease of native coronary artery without angina pectoris: Secondary | ICD-10-CM

## 2015-05-09 DIAGNOSIS — I2583 Coronary atherosclerosis due to lipid rich plaque: Secondary | ICD-10-CM

## 2015-05-09 DIAGNOSIS — E785 Hyperlipidemia, unspecified: Secondary | ICD-10-CM

## 2015-05-09 DIAGNOSIS — I10 Essential (primary) hypertension: Secondary | ICD-10-CM

## 2015-05-09 DIAGNOSIS — I6521 Occlusion and stenosis of right carotid artery: Secondary | ICD-10-CM

## 2015-05-09 DIAGNOSIS — R079 Chest pain, unspecified: Secondary | ICD-10-CM

## 2015-05-09 MED ORDER — METOPROLOL SUCCINATE ER 50 MG PO TB24: ORAL_TABLET | ORAL | Status: DC

## 2015-05-09 MED ORDER — ASPIRIN EC 81 MG PO TBEC: 81.0000 mg | DELAYED_RELEASE_TABLET | Freq: Every day | ORAL | Status: DC

## 2015-05-09 NOTE — Patient Instructions (Signed)
Your physician has recommended you make the following change in your medication: the aspirin has been changed to 81 mg daily.   Start new metoprolol succ prescription. 1/2 tablet daily. This has already been sent to your pharmacy.  Your physician recommends that you schedule a follow-up appointment in: 4 months with [name].

## 2015-05-11 ENCOUNTER — Encounter: Payer: Self-pay | Admitting: Cardiovascular Disease

## 2015-05-11 DIAGNOSIS — I4510 Unspecified right bundle-branch block: Secondary | ICD-10-CM | POA: Insufficient documentation

## 2015-05-11 NOTE — Progress Notes (Signed)
Patient ID: Jason [name], male   DOB: [birth year], 64 y.o.   MRN: [id number]    Primary M.D.: Dr. Arlyss Queen  HPI: Jason [name] is a 64 y.o. male who presents to the office today for a 3 month follow up cardiology evaluation.  Jason [name] has a history of CAD and in 2001 underwent CABG revascularization surgery.  He has a history of hypertension, diabetes mellitus, COPD, and carotid disease.  In January 2015 he underwent cardiac catheterization which revealed a patent LIMA graft to his LAD, a patent sequential graft to marginal vessels, and an occluded sequential graft which previously had supplied the PDA and PLA vessels of the RCA and there was evidence for mild left-to-right collateralization.  In July 2015.  He was admitted with recurrent anginal symptomatology.  Repeat catheterization revealed mild LV dysfunction with mid to basal inferior hypocontractility.  Ejection fraction was 50%.  He had significant native CAD with coronary calcification, 70% ostial and distal left main stenoses, 50% stenosis in the first diagonal branch was LAD, 80% mid LAD stenosis, and occluded high marginal/ramus intermediate vessel, 80% AV groove left circumflex stenosis and total proximal RCA occlusion.  He is a patent LIMA to the LAD, a patent sequential graft to a high marginal, a known 2 vessel in the vein graft to its previously supplied the PDA and PLA vessel remained occluded.  Increased medical therapy was recommended.  When I last saw him he had begun to notice more episodes of chest pain.  He had been on Ranexa 500 mg twice a day, isosorbide 30 mg, and aspirin. I titrated his Ranexa to 1000 mg bid and addedd low dose metoprolol. He had a loud carotid bruit and I referred him for carotid duplex imaging. He was found to have an occluded L ICA and high grade R ICA stenosis.  I referred him to [name] and due to his high risk for carotid endartectomy he underwent successful right internal carotid artery PTA  and stenting using a nitinol self expanding stent and distal carotid embolic protection in the setting of an occluded contralateral carotid artery and high risk coronary anatomy. He tolerated the procedure well.  Apparently his dose of metoprolol was held for his procedure, but he had not resumed this.   Jason [name] underwent a 2-D echo Doppler study on 03/31/2015.  This showed an EF of 60-65%.  There was mild LVH with basal inferoposterior hypokinesis to akinesis and grade 1 diastolic dysfunction.  He denies chest pain, PND, neurologic sx.  His pulse has been faster. He is still smoking, although considerably less than previously. He presents for evaluation.   Past Medical History  Diagnosis Date  . Coronary artery disease     CABG 2/11, cath x 4 since - med Rx  . Hypertension     type 2 NIDDM  . Diabetes mellitus   . Arthritis   . GERD (gastroesophageal reflux disease)   . High cholesterol   . Myocardial infarct (HCC)     x4 last 10 yrs  . Anxiety     off xanax  and paxil since 3/13  . COPD (chronic obstructive pulmonary disease) ([place])   . Pneumonia     hx  . Cancer ([place])     tumor basal cell rem from lft arm  . Smoker   . 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 Depression   . Substance abuse   . Cataract   .  Heart attack ([place])   . Bilateral carotid artery disease ([place])     s/p R ICA stent 03/28/2015 with distal protection. Known chronically occluded L ICA. Carotid stent complicated by hypotension and acute stroke in [place]  . CVA (cerebral infarction)     occured on 10/10 several days after R ICA carotid stenting    Past Surgical History  Procedure Laterality Date  . Femoral artery - femoral artery bypass graft Right 2004    femoral enarterectomy  . US extremity*l*      lft arm tumor removed   . Cardiac catheterization  4/12    Medical Rx  . Coronary angioplasty  Jan 2004    RCA  . Back surgery  Jan 2014, Dec 2011    Dr  Vertell Limber  . Coronary artery bypass graft  07/24/2009    L-LAD, SVG-RI/OM, SVG-PDA  . Eye surgery      cat bil  . Coronary angiogram  01/17/14    med rx  . Coronary angiogram  4/13    med Rx  . Coronary angiogram  1/15    Med Rx  . Left heart catheterization with coronary angiogram N/A 10/03/2011    Procedure: LEFT HEART CATHETERIZATION WITH CORONARY ANGIOGRAM;  Surgeon: Pixie Casino, MD;  Location: [facility] CATH LAB;  Service: Cardiovascular;  Laterality: N/A;  . Left heart catheterization with coronary angiogram N/A 07/08/2013    Procedure: LEFT HEART CATHETERIZATION WITH CORONARY ANGIOGRAM;  Surgeon: Blane Ohara, MD;  Location: [facility] CATH LAB;  Service: Cardiovascular;  Laterality: N/A;  . Left heart catheterization with coronary/graft angiogram N/A 01/17/2014    Procedure: LEFT HEART CATHETERIZATION WITH Beatrix Fetters;  Surgeon: Troy Sine, MD;  Location: [facility] CATH LAB;  Service: Cardiovascular;  Laterality: N/A;  . Peripheral vascular catheterization N/A 03/28/2015    Procedure: Carotid PTA/Stent Intervention;  Surgeon: Lorretta Harp, MD;  Location: [place] CV LAB;  Service: Cardiovascular;  Laterality: N/A;    Allergies  Allergen Reactions  . Coreg [Carvedilol] Nausea And Vomiting and Other (See Comments)    Per patient made him dizzy and light sensitive  . Sulfa Antibiotics Nausea And Vomiting and Other (See Comments)    Also headaches     Current Outpatient Prescriptions  Medication Sig Dispense Refill  . acetaminophen (TYLENOL) 325 MG tablet Take 2 tablets (650 mg total) by mouth every 4 (four) hours as needed for headache or mild pain.    Marland Kitchen ALPRAZolam (XANAX) 0.5 MG tablet Take 0.5-1 tablets (0.25-0.5 mg total) by mouth 2 (two) times daily as needed for anxiety. Or panic episode. 60 tablet 3  . atorvastatin (LIPITOR) 40 MG tablet Take 1 tablet (40 mg total) by mouth daily. 30 tablet 6  . clopidogrel (PLAVIX) 75 MG tablet Take 1 tablet (75 mg total) by mouth  daily. 90 tablet 3  . loratadine (CLARITIN) 10 MG tablet Take 10 mg by mouth daily.    . naproxen sodium (ANAPROX) 220 MG tablet Take 440 mg by mouth 2 (two) times daily as needed (for pain).    . nitroGLYCERIN (NITROSTAT) 0.4 MG SL tablet Place 1 tablet (0.4 mg total) under the tongue every 5 (five) minutes as needed for chest pain. 25 tablet 2  . ranitidine (ZANTAC) 150 MG tablet Take 150 mg by mouth daily as needed for heartburn. For acid reflux    . ranolazine (RANEXA) 1000 MG SR tablet Take 1 tablet (1,000 mg total) by mouth 2 (two) times daily. 60 tablet  6  . sertraline (ZOLOFT) 50 MG tablet Take 50 mg by mouth daily.    Marland Kitchen aspirin EC 81 MG tablet Take 1 tablet (81 mg total) by mouth daily. 90 tablet 3  . metoprolol succinate (TOPROL XL) 50 MG 24 hr tablet Take 1/2 -1 tablet as directed 30 tablet 6   No current facility-administered medications for this visit.    Social History   Social History  . Marital Status: Divorced    Spouse Name: N/A  . Number of Children: N/A  . Years of Education: N/A   Occupational History  . welder-fabricator    Social History Main Topics  . Smoking status: Current Every Day Smoker -- 1.00 packs/day for 40 years    Types: Cigarettes  . Smokeless tobacco: Never Used  . Alcohol Use: Yes     Comment: socially  . Drug Use: No  . Sexual Activity: Not Currently   Other Topics Concern  . Not on file   Social History Narrative   Exercise walking 4 times per week for 1 mile    Family History  Problem Relation Age of Onset  . Arthritis Mother   . Heart disease Father     ROS General: Negative; No fevers, chills, or night sweats HEENT: Negative; No changes in vision or hearing, sinus congestion, difficulty swallowing Pulmonary: Negative; No cough, wheezing, shortness of breath, hemoptysis Cardiovascular: See HPI:  GI: Negative; No nausea, vomiting, diarrhea, or abdominal pain GU: Negative; No dysuria, hematuria, or difficulty  voiding Musculoskeletal: Occasional muscle cramps. Hematologic: Negative; no easy bruising, bleeding Endocrine: Negative; no heat/cold intolerance; no diabetes, Neuro: Negative; no changes in balance, headaches Skin: Negative; No rashes or skin lesions Psychiatric: Negative; No behavioral problems, depression Sleep: Negative; No snoring,  daytime sleepiness, hypersomnolence, bruxism, restless legs, hypnogognic hallucinations. Other comprehensive 14 point system review is negative   Physical Exam BP 144/72 mmHg  Pulse 95  Ht $R'5\' 9"'kJ$  (1.753 m)  Wt 169 lb 11.2 oz (76.975 kg)  BMI 25.05 kg/m2 Wt Readings from Last 3 Encounters:  05/09/15 169 lb 11.2 oz (76.975 kg)  04/17/15 169 lb (76.658 kg)  03/28/15 165 lb (74.844 kg)   General: Alert, oriented, no distress.  Skin: normal turgor, no rashes, warm and dry HEENT: Normocephalic, atraumatic. Pupils equal round and reactive to light; sclera anicteric; extraocular muscles intact, No lid lag; Nose without nasal septal hypertrophy; Mouth/Parynx benign; Mallinpatti scale 3 Neck: No JVD, resolution of prior loud right carotid bruit; normal carotid upstroke Lungs: clear to ausculatation and percussion bilaterally; no wheezing or rales, normal inspiratory and expiratory effort Chest wall: without tenderness to palpitation Heart: PMI not displaced, RRR, s1 s2 normal, 1/6 systolic murmur, No diastolic murmur, no rubs, gallops, thrills, or heaves Abdomen: soft, nontender; no hepatosplenomehaly, BS+; abdominal aorta nontender and not dilated by palpation. Back: no CVA tenderness Pulses: 2+ , soft femoral bruits Musculoskeletal: full range of motion, normal strength, no joint deformities Extremities: Pulses 2+, no clubbing cyanosis or edema, Homan's sign negative  Neurologic: grossly nonfocal; Cranial nerves grossly wnl Psychologic: Normal mood and affect  ECG (independently read by me): NSR at 95; RBBB with repolariztion changes  August 2016 ECG  (independently read by me): Normal sinus rhythm at 78 bpm.  Right bundle branch block with repolarization changes.  LABS:  BMP Latest Ref Rng 04/03/2015 04/01/2015 03/31/2015  Glucose 65 - 99 mg/dL 122(H) 142(H) 145(H)  BUN 6 - 20 mg/dL $Remove'16 13 13  'SBvrWRU$ Creatinine 0.61 - 1.24 mg/dL 1.52(H)  1.33(H) 1.34(H)  Sodium 135 - 145 mmol/L 132(L) 130(L) 130(L)  Potassium 3.5 - 5.1 mmol/L 4.4 3.9 4.3  Chloride 101 - 111 mmol/L 96(L) 96(L) 98(L)  CO2 22 - 32 mmol/L $RemoveB'26 25 23  'vPcHQSic$ Calcium 8.9 - 10.3 mg/dL 9.7 9.3 8.9     Hepatic Function Latest Ref Rng 03/28/2015 10/03/2011 07/24/2009  Total Protein 6.5 - 8.1 g/dL 6.2(L) 5.8(L) 5.6(L)  Albumin 3.5 - 5.0 g/dL 3.5 3.2(L) 3.2(L)  AST 15 - 41 U/L $Remo'18 15 18  'BesWl$ ALT 17 - 63 U/L $Remo'19 19 22  'aoTKc$ Alk Phosphatase 38 - 126 U/L 69 101 74  Total Bilirubin 0.3 - 1.2 mg/dL 0.5 0.1(L) 0.8    CBC Latest Ref Rng 03/29/2015 03/28/2015 03/21/2015  WBC 4.0 - 10.5 K/uL 12.4(H) 11.1(H) 9.5  Hemoglobin 13.0 - 17.0 g/dL 13.6 13.3 15.5  Hematocrit 39.0 - 52.0 % 40.0 39.0 44.2  Platelets 150 - 400 K/uL 229 221 250   Lab Results  Component Value Date   MCV 97.1 03/29/2015   MCV 97.3 03/28/2015   MCV 96.9 03/21/2015    Lab Results  Component Value Date   TSH 3.542 09/26/2014    BNP No results found for: BNP  ProBNP    Component Value Date/Time   PROBNP 77.9 07/07/2013 2033     Lipid Panel     Component Value Date/Time   CHOL 136 04/04/2015 0645   TRIG 148 04/04/2015 0645   HDL 34* 04/04/2015 0645   CHOLHDL 4.0 04/04/2015 0645   VLDL 30 04/04/2015 0645   LDLCALC 72 04/04/2015 0645     RADIOLOGY: No results found.    ASSESSMENT AND PLAN: Jason [name] is a 64 year old male who underwent CABG revascularization surgery in 2001.  He has documented occlusion of the vein graft which previously had supplied the PDA and PLA branches of his RCA. At his last cardiac catheterization he has significant native CAD and continued to have a patent LIMA graft as well as a patent  sequential graft supplying a ramus intermediate, like vessel and OM 2 vessel.  When I last saw him I further titrated his Ranexa and added beta blocker therapy.  He is been without chest pain since.  He underwent very complex peripheral intervention to a high-grade ostial right internal carotid artery stenosis in the setting of having an occluded left internal carotid artery.  At the time of his carotid procedure.  His beta blocker had been held.  He apparently has never resumed his beta blocker therapy and is resting pulse today is 95 and he has noticed an increased sensation of increased heart rate.  I am resuming Toprol XL initially at 25 mg daily.  I also have recommended he decrease his aspirin to 81 mg and he will continue with his Plavix 75 mg for dual antiplatelet therapy.  I have reviewed his recent lab work.  Lipid studies are now excellent with a total cholesterol 136, LDL 72, triglycerides 148, although HDL is low at 34.  This is markedly improved from one year ago when his cholesterol was over 200, triglycerides 385, and LDL 123.  I will see him in 4 months for reevaluation.   Time spent: 25 minutes  Troy Sine, MD, [facility]  05/11/2015 1:04 PM

## 2015-05-23 ENCOUNTER — Encounter: Payer: Self-pay | Admitting: Cardiovascular Disease

## 2015-05-23 ENCOUNTER — Ambulatory Visit (INDEPENDENT_AMBULATORY_CARE_PROVIDER_SITE_OTHER): Payer: Self-pay | Admitting: Cardiovascular Disease

## 2015-05-23 VITALS — BP 134/76 | HR 80 | Ht 69.0 in | Wt 168.0 lb

## 2015-05-23 DIAGNOSIS — I6521 Occlusion and stenosis of right carotid artery: Secondary | ICD-10-CM

## 2015-05-23 DIAGNOSIS — I6523 Occlusion and stenosis of bilateral carotid arteries: Secondary | ICD-10-CM

## 2015-05-23 NOTE — Patient Instructions (Signed)
Medication Instructions:  Your physician recommends that you continue on your current medications as directed. Please refer to the Current Medication list given to you today.   Labwork: none  Testing/Procedures: Your physician has requested that you have a carotid duplex. This test is an ultrasound of the carotid arteries in your neck. It looks at blood flow through these arteries that supply the brain with blood. Allow one hour for this exam. There are no restrictions or special instructions. November 2017    Follow-Up: Follow up with [name] as needed.   Any Other Special Instructions Will Be Listed Below (If Applicable).     If you need a refill on your cardiac medications before your next appointment, please call your pharmacy.

## 2015-05-23 NOTE — Assessment & Plan Note (Signed)
Jason [name] had a known occluded left internal carotid artery and high-grade right ICA stenosis. He was a high risk endarterectomy candidate. The cause of this I performed right internal carotid artery stenting on him 03/28/15 using distal protection. The procedure was uncomplicated. He did develop post intervention refractory hypotension and was on pressor agents for several days. He ultimately developed a "water shed infarct" in the left parietal area but now has minimal residual neurologic sequela. He is on antiplatelet therapy. His carotid Doppler showed a widely patent right internal carotid artery stent. We will continue to follow him noninvasively.

## 2015-05-23 NOTE — Progress Notes (Signed)
05/23/2015 Jason [name]   Feb 08, 1951  [id number]  Primary Physician [name], [name], MD Primary Cardiologist: Lorretta Harp MD [name]   HPI:  Mr. Greenleaf is a 64 year old thin appearing single Caucasian male father of 13 year old daughter, grandfather to 85 year old granddaughter who was referred to me by [name] for peripheral vascular evaluation because of high-grade asymptomatic bilateral carotid artery stenosis. He has a history of ischemic heart disease status post coronary artery bypass grafting in 2011. He had multiple stents prior to that. His other problems include history of continued tobacco abuse, diabetes, hypertension and hyperlipidemia. He has had continued chest pain requiring titration of his anti-anginal medications with a known occluded sequential vein graft to the PDA and PLA. His carotid Dopplers performed on 02/28/15 revealed an occluded left internal carotid artery and high-grade right ICA stenosis. The patient is high risk for carotid endarterectomy due to his anatomy and cardiac issues including poorly vascularized and inferior wall. Because of this it was elected to proceed with angiography and potential percutaneous intervention with carotid artery stenting. I performed this procedure 03/28/15 using distal protection with an excellent angiographic result. He did have refractory postintervention hypotension and was on pressor agents for several days. He ultimately developed a left parietal watershed infarct but now has no significant residual neurologic deficits. He remains on antiplatelet therapy.   Current Outpatient Prescriptions  Medication Sig Dispense Refill  . acetaminophen (TYLENOL) 325 MG tablet Take 2 tablets (650 mg total) by mouth every 4 (four) hours as needed for headache or mild pain.    Marland Kitchen ALPRAZolam (XANAX) 0.5 MG tablet Take 0.5-1 tablets (0.25-0.5 mg total) by mouth 2 (two) times daily as needed for anxiety. Or panic episode. 60  tablet 3  . aspirin EC 81 MG tablet Take 1 tablet (81 mg total) by mouth daily. 90 tablet 3  . atorvastatin (LIPITOR) 40 MG tablet Take 1 tablet (40 mg total) by mouth daily. 30 tablet 6  . clopidogrel (PLAVIX) 75 MG tablet Take 1 tablet (75 mg total) by mouth daily. 90 tablet 3  . Cyanocobalamin (VITAMIN B-12) 2000 MCG TBCR Take 1 tablet by mouth daily.    Marland Kitchen loratadine (CLARITIN) 10 MG tablet Take 10 mg by mouth daily.    . metoprolol succinate (TOPROL XL) 50 MG 24 hr tablet Take 1/2 -1 tablet as directed 30 tablet 6  . naproxen sodium (ANAPROX) 220 MG tablet Take 440 mg by mouth 2 (two) times daily as needed (for pain).    . nitroGLYCERIN (NITROSTAT) 0.4 MG SL tablet Place 1 tablet (0.4 mg total) under the tongue every 5 (five) minutes as needed for chest pain. 25 tablet 2  . ranitidine (ZANTAC) 150 MG tablet Take 150 mg by mouth daily as needed for heartburn. For acid reflux    . ranolazine (RANEXA) 1000 MG SR tablet Take 1 tablet (1,000 mg total) by mouth 2 (two) times daily. 60 tablet 6  . sertraline (ZOLOFT) 50 MG tablet Take 50 mg by mouth daily.     No current facility-administered medications for this visit.    Allergies  Allergen Reactions  . Coreg [Carvedilol] Nausea And Vomiting and Other (See Comments)    Per patient made him dizzy and light sensitive  . Sulfa Antibiotics Nausea And Vomiting and Other (See Comments)    Also headaches     Social History   Social History  . Marital Status: Divorced    Spouse Name: N/A  . Number  of Children: N/A  . Years of Education: N/A   Occupational History  . welder-fabricator    Social History Main Topics  . Smoking status: Current Every Day Smoker -- 1.00 packs/day for 40 years    Types: Cigarettes  . Smokeless tobacco: Never Used  . Alcohol Use: Yes     Comment: socially  . Drug Use: No  . Sexual Activity: Not Currently   Other Topics Concern  . Not on file   Social History Narrative   Exercise walking 4 times per  week for 1 mile     Review of Systems: General: negative for chills, fever, night sweats or weight changes.  Cardiovascular: negative for chest pain, dyspnea on exertion, edema, orthopnea, palpitations, paroxysmal nocturnal dyspnea or shortness of breath Dermatological: negative for rash Respiratory: negative for cough or wheezing Urologic: negative for hematuria Abdominal: negative for nausea, vomiting, diarrhea, bright red blood per rectum, melena, or hematemesis Neurologic: negative for visual changes, syncope, or dizziness All other systems reviewed and are otherwise negative except as noted above.    Blood pressure 134/76, pulse 80, height 5\' 9"  (1.753 m), weight 168 lb (76.204 kg).  General appearance: alert and no distress Neck: no adenopathy, no carotid bruit, no JVD, supple, symmetrical, trachea midline and thyroid not enlarged, symmetric, no tenderness/mass/nodules Lungs: clear to auscultation bilaterally Heart: regular rate and rhythm, S1, S2 normal, no murmur, click, rub or gallop Extremities: extremities normal, atraumatic, no cyanosis or edema  EKG not performed today  ASSESSMENT AND PLAN:   Carotid stenosis- moderate 2011, 95% 2016 s/p stent Mr. Branscomb had a known occluded left internal carotid artery and high-grade right ICA stenosis. He was a high risk endarterectomy candidate. The cause of this I performed right internal carotid artery stenting on him 03/28/15 using distal protection. The procedure was uncomplicated. He did develop post intervention refractory hypotension and was on pressor agents for several days. He ultimately developed a "water shed infarct" in the left parietal area but now has minimal residual neurologic sequela. He is on antiplatelet therapy. His carotid Doppler showed a widely patent right internal carotid artery stent. We will continue to follow him noninvasively.      Lorretta Harp MD FACP,FACC,FAHA, [facility] 05/23/2015 2:24 PM

## 2015-05-29 ENCOUNTER — Other Ambulatory Visit: Payer: Self-pay | Admitting: Emergency Medicine

## 2015-06-05 ENCOUNTER — Ambulatory Visit: Payer: MEDICAID | Admitting: Neurology

## 2015-06-15 ENCOUNTER — Telehealth: Payer: Self-pay | Admitting: Family Medicine

## 2015-06-15 ENCOUNTER — Other Ambulatory Visit: Payer: Self-pay | Admitting: Emergency Medicine

## 2015-06-15 DIAGNOSIS — F419 Anxiety disorder, unspecified: Principal | ICD-10-CM

## 2015-06-15 DIAGNOSIS — F329 Major depressive disorder, single episode, unspecified: Secondary | ICD-10-CM

## 2015-06-15 MED ORDER — ALPRAZOLAM 0.5 MG PO TABS: 0.2500 mg | ORAL_TABLET | Freq: Two times a day (BID) | ORAL | Status: DC | PRN

## 2015-06-15 NOTE — Telephone Encounter (Signed)
Faxed RX to [facility]/ [address] left message on machine

## 2015-06-15 NOTE — Telephone Encounter (Signed)
lmom to pick RX up at the [address]

## 2015-06-15 NOTE — Telephone Encounter (Signed)
Patient need a refill on Xanax he runs out on the 14th of January but has an appt on the 19th please respond ([facility] )

## 2015-06-15 NOTE — Telephone Encounter (Signed)
Prescription written

## 2015-06-21 ENCOUNTER — Ambulatory Visit: Payer: MEDICAID | Admitting: Neurology

## 2015-06-29 ENCOUNTER — Other Ambulatory Visit: Payer: Self-pay | Admitting: *Deleted

## 2015-06-29 MED ORDER — RANOLAZINE ER 1000 MG PO TB12: 1000.0000 mg | ORAL_TABLET | Freq: Two times a day (BID) | ORAL | Status: DC

## 2015-06-29 NOTE — Telephone Encounter (Signed)
Pt came in for samples - none available. He was amenable to checking back with [place] next week and picking up 30 day supply at local pharmacy.

## 2015-07-05 ENCOUNTER — Other Ambulatory Visit: Payer: Self-pay | Admitting: Emergency Medicine

## 2015-07-05 ENCOUNTER — Telehealth: Payer: Self-pay

## 2015-07-05 DIAGNOSIS — F419 Anxiety disorder, unspecified: Principal | ICD-10-CM

## 2015-07-05 DIAGNOSIS — F329 Major depressive disorder, single episode, unspecified: Secondary | ICD-10-CM

## 2015-07-05 MED ORDER — ALPRAZOLAM 0.5 MG PO TABS: 0.2500 mg | ORAL_TABLET | Freq: Two times a day (BID) | ORAL | Status: DC | PRN

## 2015-07-05 NOTE — Telephone Encounter (Signed)
[name], RN at 07/05/2015 12:15 PM     Status: Signed          Medication Samples have been provided to the patient.  Drug name: RanexaQty: 4 packs of 14 tablets (1000 mg tablets)LOT: [id number].Date: 05/2018  The patient has been instructed regarding the correct time, dose, and frequency of taking this medication, including desired effects and most common side effects.   Jason [name] 12:15 PM 07/05/2015

## 2015-07-12 ENCOUNTER — Ambulatory Visit (INDEPENDENT_AMBULATORY_CARE_PROVIDER_SITE_OTHER): Payer: Self-pay | Admitting: Emergency Medicine

## 2015-07-12 ENCOUNTER — Encounter: Payer: Self-pay | Admitting: Emergency Medicine

## 2015-07-12 VITALS — BP 108/80 | HR 85 | Temp 97.2°F | Resp 16 | Ht 68.5 in | Wt 164.4 lb

## 2015-07-12 DIAGNOSIS — Z720 Tobacco use: Secondary | ICD-10-CM

## 2015-07-12 DIAGNOSIS — Z23 Encounter for immunization: Secondary | ICD-10-CM

## 2015-07-12 DIAGNOSIS — Z1159 Encounter for screening for other viral diseases: Secondary | ICD-10-CM

## 2015-07-12 DIAGNOSIS — E871 Hypo-osmolality and hyponatremia: Secondary | ICD-10-CM

## 2015-07-12 DIAGNOSIS — F419 Anxiety disorder, unspecified: Secondary | ICD-10-CM

## 2015-07-12 DIAGNOSIS — E119 Type 2 diabetes mellitus without complications: Secondary | ICD-10-CM

## 2015-07-12 DIAGNOSIS — F17200 Nicotine dependence, unspecified, uncomplicated: Secondary | ICD-10-CM

## 2015-07-12 LAB — BASIC METABOLIC PANEL WITH GFR
BUN: 11 mg/dL (ref 7–25)
CALCIUM: 9.9 mg/dL (ref 8.6–10.3)
CO2: 21 mmol/L (ref 20–31)
CREATININE: 1.14 mg/dL (ref 0.70–1.25)
Chloride: 100 mmol/L (ref 98–110)
GFR, EST AFRICAN AMERICAN: 79 mL/min (ref >=60)
GFR, EST NON AFRICAN AMERICAN: 69 mL/min (ref >=60)
Glucose, Bld: 173 mg/dL — ABNORMAL HIGH (ref 65–99)
Potassium: 4.9 mmol/L (ref 3.5–5.3)
Sodium: 131 mmol/L — ABNORMAL LOW (ref 135–146)

## 2015-07-12 LAB — POCT GLYCOSYLATED HEMOGLOBIN (HGB A1C): Hemoglobin A1C: 7.2

## 2015-07-12 LAB — GLUCOSE, POCT (MANUAL RESULT ENTRY): POC GLUCOSE: 174 mg/dL — AB (ref 70–99)

## 2015-07-12 NOTE — Progress Notes (Addendum)
Subjective:  This chart was scribed for Jason [name] by Jason [name], at [facility].  This patient was seen in room 22 and the patient's care was started at 11:49 AM.   Chief Complaint  Patient presents with  . Follow-up  . Diabetes  . stent    doing better  . Medication Refill    Metformin     Patient ID: Jason [name], male    DOB: 1950/10/20, 65 y.o.   MRN: [id number]   HPI  HPI Comments: Jason [name] is a 65 y.o. male with a history of CAD and carotid stenosis who presents to the [facility] for a follow up regarding his diabetes and would like a refill on his Metformin (1x daily). States his sugar has been running around 150. He had the flu recently which he has recovered from for the most part- he had chest pain/ tightness slightly when he was ill as well as some "clogging up" of his ears. Patient is smoking a little over 1 pack per day.  He stopped for about 4 days after being hospitalized but states that he started again.  Patient states that his smoking "depresses" him and he is aware that it has just become a habit over the years.   Anxiety: He states that he had mild anxiety this morning but took medication and states that he feels fine now.  He takes his xanax 1X per day at times and is compliant with his Zoloft.   Patient has been very busy with remodeling his home (with his sister in law and brother) and states that it has taken a toll on his knees and back.  He states that he is "tight" with his brother. Patient is a Jason [name].   Patient has a history of cocaine abuse but has been clean for a while.    Patient Active Problem List   Diagnosis Date Noted  . RBBB 05/11/2015  . Numbness of hand   . Hyponatremia 04/04/2015  . Hypotension 04/04/2015  . Hemispheric carotid artery syndrome   . Carotid artery narrowing 03/28/2015  . Carotid stenosis- moderate 2011, 95% 2016 s/p stent 02/21/2014  . Unstable angina  ([place]) 01/16/2014  . Tobacco abuse 01/16/2014  . Chest pain 07/08/2013  . Substance abuse in remission 10/01/2012  . Radiculitis 02/10/2012  . CAD, CABG Feb 2011, cath x 4 since-medical Rx 10/03/2011  . PVD, hx Rt femoral endarterectomy 2004 10/03/2011  . DM (diabetes mellitus) ([place]) 10/02/2011  . HTN (hypertension) 10/02/2011  . Hyperlipidemia 10/02/2011   Past Medical History  Diagnosis Date  . Coronary artery disease     CABG 2/11, cath x 4 since - med Rx  . Hypertension     type 2 NIDDM  . Diabetes mellitus   . Arthritis   . GERD (gastroesophageal reflux disease)   . High cholesterol   . Myocardial infarct ([facility])     x4 last 10 yrs  . Anxiety     off xanax  and paxil since 3/13  . COPD (chronic obstructive pulmonary disease) ([place])   . Pneumonia     hx  . Cancer ([place])     tumor basal cell rem from lft arm  . Smoker   . 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 Depression   . Substance abuse   . Cataract   . Heart attack ([place])   . Bilateral carotid  artery disease ([place])     s/p R ICA stent 03/28/2015 with distal protection. Known chronically occluded L ICA. Carotid stent complicated by hypotension and acute stroke in [place]  . CVA (cerebral infarction)     occured on 10/10 several days after R ICA carotid stenting   Past Surgical History  Procedure Laterality Date  . Femoral artery - femoral artery bypass graft Right 2004    femoral enarterectomy  . US extremity*l*      lft arm tumor removed   . Cardiac catheterization  4/12    Medical Rx  . Coronary angioplasty  Jan 2004    RCA  . Back surgery  Jan 2014, Dec 2011    Jason [name]  . Coronary artery bypass graft  07/24/2009    L-LAD, SVG-RI/OM, SVG-PDA  . Eye surgery      cat bil  . Coronary angiogram  01/17/14    med rx  . Coronary angiogram  4/13    med Rx  . Coronary angiogram  1/15    Med Rx  . Left heart catheterization with coronary angiogram N/A 10/03/2011     Procedure: LEFT HEART CATHETERIZATION WITH CORONARY ANGIOGRAM;  Surgeon: Jason Casino, Jason [name];  Location: Jason [name];  Service: Cardiovascular;  Laterality: N/A;  . Left heart catheterization with coronary angiogram N/A 07/08/2013    Procedure: LEFT HEART CATHETERIZATION WITH CORONARY ANGIOGRAM;  Surgeon: Jason [name];  Location: [name];  Service: Cardiovascular;  Laterality: N/A;  . Left heart catheterization with coronary/graft angiogram N/A 01/17/2014    Procedure: LEFT HEART CATHETERIZATION WITH Beatrix Fetters;  Surgeon: Jason [name];  Location: [name];  Service: Cardiovascular;  Laterality: N/A;  . Peripheral vascular catheterization N/A 03/28/2015    Procedure: Carotid PTA/Stent Intervention;  Surgeon: Jason [name];  Location: [name];  Service: Cardiovascular;  Laterality: N/A;   Allergies  Allergen Reactions  . Coreg [Carvedilol] Nausea And Vomiting and Other (See Comments)    Per patient made him dizzy and light sensitive  . Sulfa Antibiotics Nausea And Vomiting and Other (See Comments)    Also headaches    Prior to Admission medications   Medication Sig Start Date End Date Taking? Jason [name]  acetaminophen (TYLENOL) 325 MG tablet Take 2 tablets (650 mg total) by mouth every 4 (four) hours as needed for headache or mild pain. 01/18/14   Jason [name]  ALPRAZolam Duanne Moron) 0.5 MG tablet Take 0.5-1 tablets (0.25-0.5 mg total) by mouth 2 (two) times daily as needed for anxiety. Or panic episode. 07/05/15   Jason [name]  aspirin EC 81 MG tablet Take 1 tablet (81 mg total) by mouth daily. 05/09/15   Jason [name]  atorvastatin (LIPITOR) 40 MG tablet Take 1 tablet (40 mg total) by mouth daily. 02/07/15   Jason [name]  clopidogrel (PLAVIX) 75 MG tablet Take 1 tablet (75 mg total) by mouth daily. 03/06/15   Jason [name]  Cyanocobalamin (VITAMIN B-12) 2000 MCG TBCR Take 1 tablet by mouth daily.    Jason  Provider, Jason [name]  loratadine (CLARITIN) 10 MG tablet Take 10 mg by mouth daily.    Jason Provider, Jason [name]  metFORMIN (GLUCOPHAGE) 500 MG tablet Take 1 tablet (500 mg total) by mouth 2 (two) times daily. PATIENT NEEDS OFFICE VISIT FOR ADDITIONAL REFILLS 05/31/15   Jason [name]  metoprolol succinate (TOPROL XL) 50 MG 24 hr tablet Take  1/2 -1 tablet as directed 05/09/15   Jason [name]  naproxen sodium (ANAPROX) 220 MG tablet Take 440 mg by mouth 2 (two) times daily as needed (for pain).    Jason Provider, Jason [name]  nitroGLYCERIN (NITROSTAT) 0.4 MG SL tablet Place 1 tablet (0.4 mg total) under the tongue every 5 (five) minutes as needed for chest pain. 01/18/14   Jason [name]  ranitidine (ZANTAC) 150 MG tablet Take 150 mg by mouth daily as needed for heartburn. For acid reflux    Jason Provider, Jason [name]  ranolazine (RANEXA) 1000 MG SR tablet Take 1 tablet (1,000 mg total) by mouth 2 (two) times daily. 06/29/15   Jason [name]  sertraline (ZOLOFT) 50 MG tablet Take 50 mg by mouth daily.    Jason Provider, Jason [name]   Social History   Social History  . Marital Status: Single    Spouse Name: N/A  . Number of Children: N/A  . Years of Education: N/A   Occupational History  . welder-fabricator    Social History Main Topics  . Smoking status: Current Every Day Smoker -- 1.00 packs/day for 40 years    Types: Cigarettes  . Smokeless tobacco: Never Used  . Alcohol Use: Yes     Comment: socially  . Drug Use: No  . Sexual Activity: Not Currently   Other Topics Concern  . Not on file   Social History Narrative   Exercise walking 4 times per week for 1 mile        Review of Systems  Constitutional: Negative for fever and chills.  Eyes: Negative for pain, redness and itching.  Respiratory: Negative for choking.   Gastrointestinal: Negative for nausea and vomiting.  Musculoskeletal: Negative for neck pain and neck stiffness.  Neurological: Negative for syncope and speech  difficulty.       Objective:   Physical Exam  Filed Vitals:   07/12/15 1139 07/12/15 1140  BP: 150/76 144/78  Pulse: 85   Temp: 97.2 F (36.2 C)   TempSrc: Oral   Resp: 16   Height: 5' 8.5" (1.74 m)   Weight: 164 lb 6.4 oz (74.571 kg)   SpO2: 98%     CONSTITUTIONAL: Well developed/well nourished HEAD: Normocephalic/atraumatic EYES: EOMI/PERRL SPINE/BACK:entire spine nontender CV: S1/S2 noted, no murmurs/rubs/gallops noted LUNGS: decreased breath sounds in both bases.  NEURO: Pt is awake/alert/appropriate, moves all extremitiesx4.  No facial droop.   EXTREMITIES: no edema.  SKIN: warm, color normal PSYCH: no abnormalities of mood noted, alert and oriented to situation    Results for orders placed or performed in visit on 07/12/15  POCT glucose (manual entry)  Result Value Ref Range   POC Glucose 174 (A) 70 - 99 mg/dl  POCT glycosylated hemoglobin (Hb A1C)  Result Value Ref Range   Hemoglobin A1C 7.2    Assessment & Plan:  Sugar is not a goal. Glucose is 174 with an A1c is 7.2 I encouraged him to take his metformin twice a day. No change in other medications. His depression is stable. His major issue continues to be smoking we talked about this. Flu shot given today I personally performed the services described in this documentation, which was scribed in my presence. The recorded information has been reviewed and is accurate. Jason [name]

## 2015-07-12 NOTE — Patient Instructions (Addendum)

## 2015-07-13 LAB — HEPATITIS C ANTIBODY: HCV AB: NEGATIVE

## 2015-07-13 LAB — MICROALBUMIN, URINE: Microalb, Ur: 0.7 mg/dL

## 2015-07-28 ENCOUNTER — Other Ambulatory Visit: Payer: Self-pay | Admitting: Emergency Medicine

## 2015-07-28 ENCOUNTER — Other Ambulatory Visit: Payer: Self-pay | Admitting: Physician Assistant

## 2015-08-06 ENCOUNTER — Ambulatory Visit (INDEPENDENT_AMBULATORY_CARE_PROVIDER_SITE_OTHER): Payer: Self-pay | Admitting: Neurology

## 2015-08-06 ENCOUNTER — Encounter: Payer: Self-pay | Admitting: Neurology

## 2015-08-06 VITALS — BP 147/65 | HR 76 | Ht 68.5 in | Wt 166.4 lb

## 2015-08-06 DIAGNOSIS — I6930 Unspecified sequelae of cerebral infarction: Secondary | ICD-10-CM

## 2015-08-06 DIAGNOSIS — I638 Other cerebral infarction: Secondary | ICD-10-CM

## 2015-08-06 DIAGNOSIS — R5383 Other fatigue: Secondary | ICD-10-CM

## 2015-08-06 NOTE — Patient Instructions (Signed)
Remember to drink plenty of fluid, eat healthy meals and do not skip any meals. Try to eat protein with a every meal and eat a healthy snack such as fruit or nuts in between meals. Try to keep a regular sleep-wake schedule and try to exercise daily, particularly in the form of walking, 20-30 minutes a day, if you can.   As far as your medications are concerned, I would like to suggest: Continue current medication  As far as diagnostic testing: Lab  I would like to see you back in 6 months, sooner if we need to. Please call us with any interim questions, concerns, problems, updates or refill requests.   Our phone number is [phone]. We also have an after hours call service for urgent matters and there is a physician on-call for urgent questions. For any emergencies you know to call 911 or go to the nearest emergency room

## 2015-08-06 NOTE — Progress Notes (Signed)
[id number]    Provider:  Dr Jaynee Eagles Referring Provider: [name], MD Primary Care Physician:  Jenny Reichmann, MD  CC:  stroke  HPI:  Jason [name] is a 65 y.o. male here as a referral from Dr. Everlene Farrier for stroke. Past medical history coronary artery disease, hypertension, diabetes, hyperlipidemia, anxiety, COPD, current tobacco use, right bundle branch block, depression, substance abuse, bilateral carotid artery disease, cerebral infarction. Patient was hospitalized for acute onset right-sided symptoms in the setting of cardiac cath ans hypotension several days previously. On October 10th he had right-sided hand, arm and lower face numbness and heaviness. His BP was 123/63 neurologic exam showed nml sensation and strength, upgoing right toe. Blood pressures were decreased while inpatient as low as 90 systolic.  Symptoms resolved while inpatient. He has residual right finger sensory changes in digit 4-5. It is hard for him to play blue grass. He has residual right sided digits 4-5 symptoms. He is sleeping a lot, attributes this to depression and anxiety. Doesn't believe he snores. He takes alprazolam at night. He can sleep for 14 hours a night and still feel tired sometimes. Some nights he is up all night. Sleep hygiene is not good, he has no regular sleeping hours. Discussed good sleep hygiene. In the future pcp may consider a sleep study if warranted. The numbness in the 2 fingers radiates to the wrist. He is not interested in an emg/ncs of the right hand to ensure it isn't an ulnar neuropathy. Most symptoms have resolved. He is currently on dual anti-platelet therapy. He hasn't quit smoking, has been smoking for many years. He denies any significant alcohol use <1 a day. He has a BP monitor and taking it daily and blood pressure has been good, not low. BP goal 120-150, discussed with patient. He is on Lipitor for cholesterol. Discussed smoking cessation. He has been very tired  and hasn't walked a lot.   Reviewed notes, labs and imaging from outside physicians, which showed:  Patient admitted to [facility] in October 2016. Jason [name] is an 65 y.o. male with multiple stroke risk factors. Patient underwent a cardiac cath on 03-28-15. Post op he did have hypotension with systolic BP in the 0000000. For 5 days after patient had no issues. On 04-02-15 at 1900 (LKW) he noted his right hand,arm and lower face felt heavy and had decreased sensation. On the am of 04/03/15, symptoms have improved but not fully resolved. He currently has ulnar distribution decreased sensation and decreased sensation on the top protion of his foot laterally--all on the right side. He did have a right sided HA yesterday but this has resolved.  Of note--he has high grade stenosis of his RIC but this would not corrilate with symptoms since they are on the right.  He does admit to having tingling sensations when he has panic attacks but usually on the left. HE also noted he was not having a panic attack yesterday.  He is on Plavix at home, but currently on ASA and Plavix. His MRI showed right MCA/ACA punctate infarct, not correlated to his symptoms. However, his BP up and down, as low as 90s and this am is in 120s.  03-31-15 echo--60-65% with basal inferoposterior hypo-akinesis 02-28-15 CUS---80-99% ostial RICA stenosis, with post-stenotic dilatation and turbulence. Occluded LICA, age undetermined. patent vertebral arteries with antegrade flow. Normal subclavian arteries, bilaterally.  Patient was not administered TPA at time of neuro consult secondary to being outside the window.  MRI HEAD 04/04/2015 Acute 11 x 4 mm RIGHT parietal lobe infarct (watershed territory). Moderate global parenchymal brain volume loss. Moderate white matter changes compatible with chronic small vessel ischemic disease. Small areas of biparietal and LEFT occipital lobe encephalomalacia may be post traumatic or post  ischemic.   MRA HEAD 04/04/2015 LEFT internal carotid artery occlusion could be acute. Patent anterior communicating artery. Moderate luminal irregularity and stenosis RIGHT carotid siphon most compatible with atherosclerosis. Slow flow versus possible high-grade stenosis LEFT V4 segment.   2D Echocardiogram  - Left ventricle: The cavity size was normal. Wall thickness wasincreased in a pattern of mild LVH. Systolic function was normal.The estimated ejection fraction was in the range of 60% to 65%.Basal inferoposterior hypokinesis to akinesis. Doppler parametersare consistent with abnormal left ventricular relaxation (grade 1diastolic dysfunction). The E/e&' ratio is >20, suggestingmarkedly elevated LV filling pressure. - Aortic valve: Trileaflet; mildly calcified leaflets. There was nostenosis. There was no regurgitation. - Mitral valve: Calcified annulus. Mildly thickened leaflets . There was mild regurgitation. - Left atrium: The atrium was normal in size. - Right ventricle: The cavity size was normal. Wall thickness wasnormal. Systolic function is low normal. - Right atrium: Moderately dilated. - Atrial septum: A patent foramen ovale cannot be excluded. - Inferior vena cava: The vessel was normal in size. The respirophasic diameter changes were in the normal range (= 50%),consistent with normal central venous pressure. Impressions: Compared to a prior echo in 2013, the EF has improved to 60-65%.There is basal inferoposterior hypokinesis to akinesis - possiblysuggesting prior infarct in this territory. Diastolic dysfunctionwith elevated LV filling pressure is noted.  CUS 02/28/15 - 80-99% ostial RICA stenosis, with post-stenotic dilatation and turbulence. Occluded LICA, age undetermined. Patent vertebral arteries with antegrade flow. Normal subclavian arteries, bilaterally.  CUS 04/04/15 - Right: ICA stent is patent with 50-75% stenosis in the low end of the range.  Left: ICA remains occluded. Bilateral: Vertebral artery flow is antegrade.  Cerebral angio - Carotid artery stenting using distal protection right internal carotid artery. Review of Systems: Patient complains of symptoms per HPI as well as the following symptoms: Fatigue, easy bruising, easy bleeding, chest pain, shortness of breath, cough, ringing in ears, joint pain, aching muscles, confusion, numbness, weakness, dizziness, depression, anxiety, too much sleep, decreased energy, disinterest in activities, racing thoughts. Pertinent negatives per HPI. All others negative.   Social History   Social History  . Marital Status: Single    Spouse Name: N/A  . Number of Children: 1  . Years of Education: 14   Occupational History  . welder-fabricator    Social History Main Topics  . Smoking status: Current Every Day Smoker -- 1.00 packs/day for 40 years    Types: Cigarettes  . Smokeless tobacco: Never Used  . Alcohol Use: 0.0 oz/week    0 Standard drinks or equivalent per week     Comment: socially  . Drug Use: No     Comment: hx of cocaine use  . Sexual Activity: Not Currently   Other Topics Concern  . Not on file   Social History Narrative   Lives alone   Caffeine use: Drinks pepsi/coffee (32 oz diet pepsi per day)    Exercise walking 4 times per week for 1 mile    Family History  Problem Relation Age of Onset  . Arthritis Mother   . Heart disease Father     Past Medical History  Diagnosis Date  . Coronary artery disease     CABG 2/11,  cath x 4 since - med Rx  . Hypertension     type 2 NIDDM  . Diabetes mellitus   . Arthritis   . GERD (gastroesophageal reflux disease)   . High cholesterol   . Myocardial infarct (HCC)     x4 last 10 yrs  . Anxiety     off xanax  and paxil since 3/13  . COPD (chronic obstructive pulmonary disease) ([place])   . Pneumonia     hx  . Cancer ([place])     tumor basal cell rem from lft arm  . Smoker   . 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 Depression   . Substance abuse   . Cataract   . Heart attack ([place])   . Bilateral carotid artery disease ([place])     s/p R ICA stent 03/28/2015 with distal protection. Known chronically occluded L ICA. Carotid stent complicated by hypotension and acute stroke in watershed territory  . CVA (cerebral infarction)     occured on 10/10 several days after R ICA carotid stenting    Past Surgical History  Procedure Laterality Date  . Femoral artery - femoral artery bypass graft Right 2004    femoral enarterectomy  . US extremity*l*      lft arm tumor removed   . Cardiac catheterization  4/12    Medical Rx  . Coronary angioplasty  Jan 2004    RCA  . Back surgery  Jan 2014, Dec 2011    Dr Vertell Limber  . Coronary artery bypass graft  07/24/2009    L-LAD, SVG-RI/OM, SVG-PDA  . Eye surgery      cat bil  . Coronary angiogram  01/17/14    med rx  . Coronary angiogram  4/13    med Rx  . Coronary angiogram  1/15    Med Rx  . Left heart catheterization with coronary angiogram N/A 10/03/2011    Procedure: LEFT HEART CATHETERIZATION WITH CORONARY ANGIOGRAM;  Surgeon: Pixie Casino, MD;  Location: [facility] CATH LAB;  Service: Cardiovascular;  Laterality: N/A;  . Left heart catheterization with coronary angiogram N/A 07/08/2013    Procedure: LEFT HEART CATHETERIZATION WITH CORONARY ANGIOGRAM;  Surgeon: Blane Ohara, MD;  Location: [facility] CATH LAB;  Service: Cardiovascular;  Laterality: N/A;  . Left heart catheterization with coronary/graft angiogram N/A 01/17/2014    Procedure: LEFT HEART CATHETERIZATION WITH Beatrix Fetters;  Surgeon: Troy Sine, MD;  Location: [facility] CATH LAB;  Service: Cardiovascular;  Laterality: N/A;  . Peripheral vascular catheterization N/A 03/28/2015    Procedure: Carotid PTA/Stent Intervention;  Surgeon: Lorretta Harp, MD;  Location: [facility] CV LAB;  Service: Cardiovascular;  Laterality: N/A;    Current Outpatient Prescriptions    Medication Sig Dispense Refill  . acetaminophen (TYLENOL) 325 MG tablet Take 2 tablets (650 mg total) by mouth every 4 (four) hours as needed for headache or mild pain.    Marland Kitchen ALPRAZolam (XANAX) 0.5 MG tablet Take 0.5-1 tablets (0.25-0.5 mg total) by mouth 2 (two) times daily as needed for anxiety. Or panic episode. 60 tablet 3  . aspirin 325 MG tablet Take 325 mg by mouth daily.    Marland Kitchen atorvastatin (LIPITOR) 40 MG tablet Take 1 tablet (40 mg total) by mouth daily. 30 tablet 6  . clopidogrel (PLAVIX) 75 MG tablet Take 1 tablet (75 mg total) by mouth daily. 90 tablet 3  . Cyanocobalamin (VITAMIN B-12) 2000 MCG TBCR Take 1 tablet by mouth  daily.    . loratadine (CLARITIN) 10 MG tablet Take 10 mg by mouth daily.    . metFORMIN (GLUCOPHAGE) 500 MG tablet TAKE 1 TABLET BY MOUTH TWICE A DAY (MUST HAVE APPOINTMENT FOR FURTHER REFILLS) 60 tablet 0  . metoprolol succinate (TOPROL XL) 50 MG 24 hr tablet Take 1/2 -1 tablet as directed 30 tablet 6  . naproxen sodium (ANAPROX) 220 MG tablet Take 440 mg by mouth 2 (two) times daily as needed (for pain).    . ranitidine (ZANTAC) 150 MG tablet Take 150 mg by mouth daily as needed for heartburn. For acid reflux    . ranolazine (RANEXA) 1000 MG SR tablet Take 1 tablet (1,000 mg total) by mouth 2 (two) times daily. 60 tablet 5  . sertraline (ZOLOFT) 50 MG tablet Take 50 mg by mouth daily.    Marland Kitchen aspirin EC 81 MG tablet Take 1 tablet (81 mg total) by mouth daily. (Patient not taking: Reported on 07/12/2015) 90 tablet 3  . nitroGLYCERIN (NITROSTAT) 0.4 MG SL tablet Place 1 tablet (0.4 mg total) under the tongue every 5 (five) minutes as needed for chest pain. (Patient not taking: Reported on 08/06/2015) 25 tablet 2   No current facility-administered medications for this visit.    Allergies as of 08/06/2015 - Review Complete 08/06/2015  Allergen Reaction Noted  . Coreg [carvedilol] Nausea And Vomiting and Other (See Comments) 07/26/2013  . Sulfa antibiotics Nausea And  Vomiting and Other (See Comments) 10/02/2011    Vitals: BP 147/65 mmHg  Pulse 76  Ht 5' 8.5" (1.74 m)  Wt 166 lb 6.4 oz (75.479 kg)  BMI 24.93 kg/m2 Last Weight:  Wt Readings from Last 1 Encounters:  08/06/15 166 lb 6.4 oz (75.479 kg)   Last Height:   Ht Readings from Last 1 Encounters:  08/06/15 5' 8.5" (1.74 m)    Physical exam: Exam: Gen: NAD, conversant, well nourised, obese, well groomed                     CV: RRR, no MRG. No Carotid Bruits. No peripheral edema, warm, nontender Eyes: Conjunctivae clear without exudates or hemorrhage  Neuro: Detailed Neurologic Exam  Speech:    Speech is normal; fluent and spontaneous with normal comprehension.  Cognition:    The patient is oriented to person, place, and time;     recent and remote memory intact;     language fluent;     normal attention, concentration,     fund of knowledge Cranial Nerves:    The pupils are equal, round, and reactive to light. The fundi are without edema. Visual fields are full to finger confrontation. Extraocular movements are intact. Trigeminal sensation is intact and the muscles of mastication are normal. The face is symmetric. The palate elevates in the midline. Hearing intact. Voice is normal. Shoulder shrug is normal. The tongue has normal motion without fasciculations.   Coordination:    No dysmetria   Gait:   Not ataxic  Motor Observation:    No asymmetry, no atrophy, and no involuntary movements noted. Tone:    Normal muscle tone.    Posture:    Posture is normal. normal erect    Strength:    Strength is V/V in the upper and lower limbs.      Sensation: intact to LT     Reflex Exam:  DTR's:    Deep tendon reflexes in the upper and lower extremities are brisk bilaterally.   Toes:  The toes are equivocal bilaterally.   Clonus:    Clonus is absent.      Assessment/Plan:    ASSESSMENT/PLAN Mr. Jason [name] is a 65 y.o. male with history of CAD, hypertension,  hyperlipidemia, COPD, cigarette smoker, carotid disease who underwent carotid stenting on 03/28/2015. Post op developed hypotension due to sensitivity of carotid sinus. 5 days later developed R numbness hand, foot and face during admission. He did not receive IV t-PA at time of neuro consult as out of the window.   Stroke: Right parietal watershed small infarct secondary to carotid procedure. However, this is not the cause of his right sided hemiparesthesia. His right side symptoms likely due to hypoperfusion secondary to hypotension with sensitive carotid sinus after carotid stent. BP control is key for him.  Resultant Resolution of symptoms  MRI R small MCA/ACA watershed infarct  MRA Chronic L ICA occlusion, R ICA atherosclerotic irregularity  Carotid Doppler post right ICA stent 50-75% stenosis, left ICA occluded  2D Echo unremarkable   LDL 72  HgbA1c 6.8  SCDs ordered for VTE prophylaxis   aspirin 325 mg orally every day and clopidogrel 75 mg orally every day. Continue dual antiplatelet.  Patient counseled to be compliant with his antithrombotic medications  Ongoing aggressive stroke risk factor management  Right carotid Stenosis  S/p R ICA stent 2016 for 95% stenosis with post procedure hypotension  Known total occlusion L ICA  Repeat carotid doppler showed right ICA 50-75% stenosis, left occluded  Avoid hypotension  BP goal 120-150  Check Bp at home and record and follow up with PCP closely  Hyperlipidemia  Home meds: lipitor 40, resumed in hospital  LDL 72, goal < 70  Continue lipitor 40  Tobacco abuse  Current smoker  Smoking cessation counseling provided  Pt is not willing to quit  Fatigue: Will check TSH    Sarina Ill, MD  [facility] [address] [place], [place] [address]  Phone [phone] Fax [phone]

## 2015-08-07 ENCOUNTER — Telehealth: Payer: Self-pay | Admitting: Neurology

## 2015-08-07 ENCOUNTER — Telehealth: Payer: Self-pay | Admitting: *Deleted

## 2015-08-07 LAB — THYROID PANEL WITH TSH
FREE THYROXINE INDEX: 1.9 (ref 1.2–4.9)
T3 UPTAKE RATIO: 29 % (ref 24–39)
T4 TOTAL: 6.4 ug/dL (ref 4.5–12.0)
TSH: 4.67 u[IU]/mL — AB (ref 0.450–4.500)

## 2015-08-07 NOTE — Telephone Encounter (Signed)
LVM for pt to call about results. Gave [facility] phone number.  

## 2015-08-07 NOTE — Telephone Encounter (Signed)
-----   Message from Melvenia Beam, MD sent at 08/07/2015 12:58 PM EST ----- Thyroid TSH is a very mildly elevated which can be seen in hypothyroidism. However the rest of thyroid labs look ok though.  We can forward this to his primary care and he can follow-up with them to discuss thanks.

## 2015-08-07 NOTE — Telephone Encounter (Signed)
Forwarded tsh results to dr Remo Lipps daub.

## 2015-08-08 NOTE — Telephone Encounter (Signed)
Patient returned [name]'s call ° °

## 2015-08-08 NOTE — Telephone Encounter (Signed)
Patient aware of thyroid results and recommendations. 

## 2015-08-15 ENCOUNTER — Telehealth: Payer: Self-pay

## 2015-08-15 NOTE — Telephone Encounter (Signed)
Patient walked in office requesting ranexa samples.Ranexa 1000 mg samples given to patient.

## 2015-08-16 ENCOUNTER — Ambulatory Visit: Payer: Self-pay | Admitting: Emergency Medicine

## 2015-08-17 ENCOUNTER — Ambulatory Visit (INDEPENDENT_AMBULATORY_CARE_PROVIDER_SITE_OTHER): Payer: Self-pay | Admitting: Emergency Medicine

## 2015-08-17 VITALS — BP 154/72 | HR 71 | Temp 98.1°F | Resp 18 | Wt 168.2 lb

## 2015-08-17 DIAGNOSIS — E039 Hypothyroidism, unspecified: Secondary | ICD-10-CM | POA: Insufficient documentation

## 2015-08-17 MED ORDER — LEVOTHYROXINE SODIUM 50 MCG PO TABS: 50.0000 ug | ORAL_TABLET | Freq: Every day | ORAL | Status: DC

## 2015-08-17 NOTE — Progress Notes (Signed)
By signing my name below, I, Moises Blood, attest that this documentation has been prepared under the direction and in the presence of Arlyss Queen, MD. Electronically Signed: Moises Blood, [place]. 08/17/2015 , 3:55 PM .  Patient was seen in room 11 .  Chief Complaint:  Chief Complaint  Patient presents with  . Follow-up    for thyroid blood work    HPI: Jason [name] is a 65 y.o. male who reports to [facility] today for follow up and needs blood work done. He went to neurologist and had blood test done. His TSH was only slightly elevated at 4.67. He is still having fatigue spells. He hasn't been able to find the motivation to play guitar or go fishing lately.   Past Medical History  Diagnosis Date  . Coronary artery disease     CABG 2/11, cath x 4 since - med Rx  . Hypertension     type 2 NIDDM  . Diabetes mellitus   . Arthritis   . GERD (gastroesophageal reflux disease)   . High cholesterol   . Myocardial infarct (HCC)     x4 last 10 yrs  . Anxiety     off xanax  and paxil since 3/13  . COPD (chronic obstructive pulmonary disease) ([place])   . Pneumonia     hx  . Cancer ([place])     tumor basal cell rem from lft arm  . Smoker   . 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 Depression   . Substance abuse   . Cataract   . [place])   . Bilateral carotid artery disease ([place])     s/p R ICA stent 03/28/2015 with distal protection. Known chronically occluded L ICA. Carotid stent complicated by hypotension and acute stroke [place]  . CVA (cerebral infarction)     occured on 10/10 several days after R ICA carotid stenting   Past Surgical History  Procedure Laterality Date  . Femoral artery - femoral artery bypass graft Right 2004    femoral enarterectomy  . US extremity*l*      lft arm tumor removed   . Cardiac catheterization  4/12    Medical Rx  . Coronary angioplasty  Jan 2004    RCA  . Back surgery  Jan 2014,  Dec 2011    Dr Vertell Limber  . Coronary artery bypass graft  07/24/2009    L-LAD, SVG-RI/OM, SVG-PDA  . Eye surgery      cat bil  . Coronary angiogram  01/17/14    med rx  . Coronary angiogram  4/13    med Rx  . Coronary angiogram  1/15    Med Rx  . Left heart catheterization with coronary angiogram N/A 10/03/2011    Procedure: LEFT HEART CATHETERIZATION WITH CORONARY ANGIOGRAM;  Surgeon: Pixie Casino, MD;  Location: [facility] CATH LAB;  Service: Cardiovascular;  Laterality: N/A;  . Left heart catheterization with coronary angiogram N/A 07/08/2013    Procedure: LEFT HEART CATHETERIZATION WITH CORONARY ANGIOGRAM;  Surgeon: Blane Ohara, MD;  Location: [facility] CATH LAB;  Service: Cardiovascular;  Laterality: N/A;  . Left heart catheterization with coronary/graft angiogram N/A 01/17/2014    Procedure: LEFT HEART CATHETERIZATION WITH Beatrix Fetters;  Surgeon: Troy Sine, MD;  Location: [facility] CATH LAB;  Service: Cardiovascular;  Laterality: N/A;  . Peripheral vascular catheterization N/A 03/28/2015    Procedure: Carotid PTA/Stent Intervention;  Surgeon: Lorretta Harp, MD;  Location: [facility] CV LAB;  Service: Cardiovascular;  Laterality: N/A;   Social History   Social History  . Marital Status: Single    Spouse Name: N/A  . Number of Children: 1  . Years of Education: 14   Occupational History  . welder-fabricator    Social History Main Topics  . Smoking status: Current Every Day Smoker -- 1.00 packs/day for 40 years    Types: Cigarettes  . Smokeless tobacco: Never Used  . Alcohol Use: 0.0 oz/week    0 Standard drinks or equivalent per week     Comment: socially  . Drug Use: No     Comment: hx of cocaine use  . Sexual Activity: Not Currently   Other Topics Concern  . None   Social History Narrative   Lives alone   Caffeine use: Drinks pepsi/coffee (32 oz diet pepsi per day)    Exercise walking 4 times per week for 1 mile   Family History  Problem Relation Age of Onset    . Arthritis Mother   . Heart disease Father    Allergies  Allergen Reactions  . Coreg [Carvedilol] Nausea And Vomiting and Other (See Comments)    Per patient made him dizzy and light sensitive  . Sulfa Antibiotics Nausea And Vomiting and Other (See Comments)    Also headaches    Prior to Admission medications   Medication Sig Start Date End Date Taking? Authorizing Provider  acetaminophen (TYLENOL) 325 MG tablet Take 2 tablets (650 mg total) by mouth every 4 (four) hours as needed for headache or mild pain. 01/18/14  Yes Luke K Kilroy, PA-C  ALPRAZolam Duanne Moron) 0.5 MG tablet Take 0.5-1 tablets (0.25-0.5 mg total) by mouth 2 (two) times daily as needed for anxiety. Or panic episode. 07/05/15  Yes [name], MD  aspirin 325 MG tablet Take 325 mg by mouth daily.   Yes Historical Provider, MD  atorvastatin (LIPITOR) 40 MG tablet Take 1 tablet (40 mg total) by mouth daily. 02/07/15  Yes Troy Sine, MD  clopidogrel (PLAVIX) 75 MG tablet Take 1 tablet (75 mg total) by mouth daily. 03/06/15  Yes Lorretta Harp, MD  Cyanocobalamin (VITAMIN B-12) 2000 MCG TBCR Take 1 tablet by mouth daily.   Yes Historical Provider, MD  loratadine (CLARITIN) 10 MG tablet Take 10 mg by mouth daily.   Yes Historical Provider, MD  metFORMIN (GLUCOPHAGE) 500 MG tablet TAKE 1 TABLET BY MOUTH TWICE A DAY (MUST HAVE APPOINTMENT FOR FURTHER REFILLS) 07/30/15  Yes [name], MD  metoprolol succinate (TOPROL XL) 50 MG 24 hr tablet Take 1/2 -1 tablet as directed 05/09/15  Yes Troy Sine, MD  naproxen sodium (ANAPROX) 220 MG tablet Take 440 mg by mouth 2 (two) times daily as needed (for pain).   Yes Historical Provider, MD  ranitidine (ZANTAC) 150 MG tablet Take 150 mg by mouth daily as needed for heartburn. For acid reflux   Yes Historical Provider, MD  ranolazine (RANEXA) 1000 MG SR tablet Take 1 tablet (1,000 mg total) by mouth 2 (two) times daily. 06/29/15  Yes Troy Sine, MD  sertraline (ZOLOFT) 50 MG tablet  Take 50 mg by mouth daily.   Yes Historical Provider, MD  aspirin EC 81 MG tablet Take 1 tablet (81 mg total) by mouth daily. Patient not taking: Reported on 07/12/2015 05/09/15   Troy Sine, MD  nitroGLYCERIN (NITROSTAT) 0.4 MG SL tablet Place 1 tablet (0.4 mg total) under the  tongue every 5 (five) minutes as needed for chest pain. Patient not taking: Reported on 08/06/2015 01/18/14   Erlene Quan, PA-C     ROS:  Constitutional: negative for fever, chills, night sweats, weight changes; positive for fatigue HEENT: negative for vision changes, hearing loss, congestion, rhinorrhea, ST, epistaxis, or sinus pressure Cardiovascular: negative for chest pain or palpitations Respiratory: negative for hemoptysis, wheezing, shortness of breath, or cough Abdominal: negative for abdominal pain, nausea, vomiting, diarrhea, or constipation Dermatological: negative for rash Neurologic: negative for headache, dizziness, or syncope All other systems reviewed and are otherwise negative with the exception to those above and in the HPI.  PHYSICAL EXAM: Filed Vitals:   08/17/15 1512 08/17/15 1534  BP: 142/80 154/72  Pulse: 71   Temp: 98.1 F (36.7 C)   Resp: 18    Body mass index is 25.2 kg/(m^2).   General: Alert, no acute distress HEENT:  Normocephalic, atraumatic, oropharynx patent. Eye: Juliette Mangle [facility] Cardiovascular:  Regular rate and rhythm, no rubs murmurs or gallops.  No Carotid bruits, radial pulse intact. No pedal edema.  Respiratory: Clear to auscultation bilaterally.  No wheezes, rales, or rhonchi.  No cyanosis, no use of accessory musculature; decreased breath sounds in both bases Abdominal: No organomegaly, abdomen is soft and non-tender, positive bowel sounds. No masses. Musculoskeletal: Gait intact. No edema, tenderness Skin: 0.5cm raised, pearly lesion beneath right eye Neurologic: Facial musculature symmetric. Psychiatric: Patient acts appropriately throughout our interaction.    Lymphatic: No cervical or submandibular lymphadenopathy Genitourinary/Anorectal: No acute findings  LABS:   EKG/XRAY:   Primary read interpreted by Dr. Everlene Farrier at [facility]   ASSESSMENT/PLAN:  his TSH is only slightly elevated. He does complain of significant fatigue and falls asleep easily. We'll go ahead and start a low-dose Synthroid 0.5 mg and see if this helps. He also has a skin cancer beneath his right eye and he plans to call his dermatologist at [facility] to have this removed. I told him if he needed a referral to le me know and i would make the appointment for him.I personally performed the services described in this documentation, which was scribed in my presence. The recorded information has been reviewed and is accurate.   Gross sideeffects, risk and benefits, and alternatives of medications d/w patient. Patient is aware that all medications have potential sideeffects and we are unable to predict every sideeffect or drug-drug interaction that may occur.  Arlyss Queen MD 08/17/2015 3:55 PM

## 2015-09-11 ENCOUNTER — Ambulatory Visit: Payer: Self-pay | Admitting: Cardiovascular Disease

## 2015-09-14 ENCOUNTER — Ambulatory Visit (INDEPENDENT_AMBULATORY_CARE_PROVIDER_SITE_OTHER): Payer: Self-pay

## 2015-09-14 ENCOUNTER — Ambulatory Visit (INDEPENDENT_AMBULATORY_CARE_PROVIDER_SITE_OTHER): Payer: Self-pay | Admitting: Emergency Medicine

## 2015-09-14 VITALS — BP 132/76 | HR 76 | Temp 97.9°F | Resp 16 | Ht 69.0 in | Wt 167.0 lb

## 2015-09-14 DIAGNOSIS — J208 Acute bronchitis due to other specified organisms: Secondary | ICD-10-CM

## 2015-09-14 DIAGNOSIS — Z720 Tobacco use: Secondary | ICD-10-CM

## 2015-09-14 DIAGNOSIS — F17200 Nicotine dependence, unspecified, uncomplicated: Secondary | ICD-10-CM

## 2015-09-14 DIAGNOSIS — R059 Cough, unspecified: Secondary | ICD-10-CM

## 2015-09-14 DIAGNOSIS — R05 Cough: Secondary | ICD-10-CM

## 2015-09-14 LAB — POCT CBC
GRANULOCYTE PERCENT: 69.5 % (ref 37–80)
HEMATOCRIT: 38.7 % — AB (ref 43.5–53.7)
HEMOGLOBIN: 14 g/dL — AB (ref 14.1–18.1)
Lymph, poc: 3.2 (ref 0.6–3.4)
MCH: 34.6 pg — AB (ref 27–31.2)
MCHC: 36 g/dL — AB (ref 31.8–35.4)
MCV: 96.1 fL (ref 80–97)
MID (cbc): 0.6 (ref 0–0.9)
MPV: 6 fL (ref 0–99.8)
POC GRANULOCYTE: 8.7 — AB (ref 2–6.9)
POC LYMPH PERCENT: 25.3 %L (ref 10–50)
POC MID %: 5.2 % (ref 0–12)
Platelet Count, POC: 232 10*3/uL (ref 142–424)
RBC: 4.03 M/uL — AB (ref 4.69–6.13)
RDW, POC: 12.5 %
WBC: 12.5 10*3/uL — AB (ref 4.6–10.2)

## 2015-09-14 LAB — POCT INFLUENZA A/B
Influenza A, POC: NEGATIVE
Influenza B, POC: NEGATIVE

## 2015-09-14 MED ORDER — DOXYCYCLINE HYCLATE 100 MG PO CAPS: 100.0000 mg | ORAL_CAPSULE | Freq: Two times a day (BID) | ORAL | Status: DC

## 2015-09-14 NOTE — Patient Instructions (Addendum)
   IF you received an x-ray today, you will receive an invoice from [facility]. Please contact [facility] at [phone] with questions or concerns regarding your invoice.   IF you received labwork today, you will receive an invoice from [facility]. Please contact [facility] at [phone] with questions or concerns regarding your invoice.   Our billing staff will not be able to assist you with questions regarding bills from these companies.  You will be contacted with the lab results as soon as they are available. The fastest way to get your results is to activate your My Chart account. Instructions are located on the last page of this paperwork. If you have not heard from us regarding the results in 2 weeks, please contact this office.    Acute Bronchitis Bronchitis is inflammation of the airways that extend from the windpipe into the lungs (bronchi). The inflammation often causes mucus to develop. This leads to a cough, which is the most common symptom of bronchitis.  In acute bronchitis, the condition usually develops suddenly and goes away over time, usually in a couple weeks. Smoking, allergies, and asthma can make bronchitis worse. Repeated episodes of bronchitis may cause further lung problems.  CAUSES Acute bronchitis is most often caused by the same virus that causes a cold. The virus can spread from person to person (contagious) through coughing, sneezing, and touching contaminated objects. SIGNS AND SYMPTOMS   Cough.   Fever.   Coughing up mucus.   Body aches.   Chest congestion.   Chills.   Shortness of breath.   Sore throat.  DIAGNOSIS  Acute bronchitis is usually diagnosed through a physical exam. Your health care provider will also ask you questions about your medical history. Tests, such as chest X-rays, are sometimes done to rule out other conditions.  TREATMENT  Acute bronchitis usually goes away in a  couple weeks. Oftentimes, no medical treatment is necessary. Medicines are sometimes given for relief of fever or cough. Antibiotic medicines are usually not needed but may be prescribed in certain situations. In some cases, an inhaler may be recommended to help reduce shortness of breath and control the cough. A cool mist vaporizer may also be used to help thin bronchial secretions and make it easier to clear the chest.  HOME CARE INSTRUCTIONS  Get plenty of rest.   Drink enough fluids to keep your urine clear or pale yellow (unless you have a medical condition that requires fluid restriction). Increasing fluids may help thin your respiratory secretions (sputum) and reduce chest congestion, and it will prevent dehydration.   Take medicines only as directed by your health care provider.  If you were prescribed an antibiotic medicine, finish it all even if you start to feel better.  Avoid smoking and secondhand smoke. Exposure to cigarette smoke or irritating chemicals will make bronchitis worse. If you are a smoker, consider using nicotine gum or skin patches to help control withdrawal symptoms. Quitting smoking will help your lungs heal faster.   Reduce the chances of another bout of acute bronchitis by washing your hands frequently, avoiding people with cold symptoms, and trying not to touch your hands to your mouth, nose, or eyes.   Keep all follow-up visits as directed by your health care provider.  SEEK MEDICAL CARE IF: Your symptoms do not improve after 1 week of treatment.  SEEK IMMEDIATE MEDICAL CARE IF:  You develop an increased fever or chills.   You have chest pain.     You have severe shortness of breath.  You have bloody sputum.   You develop dehydration.  You faint or repeatedly feel like you are going to pass out.  You develop repeated vomiting.  You develop a severe headache. MAKE SURE YOU:   Understand these instructions.  Will watch your  condition.  Will get help right away if you are not doing well or get worse.   This information is not intended to replace advice given to you by your health care provider. Make sure you discuss any questions you have with your health care provider.   Document Released: 07/17/2004 Document Revised: 06/30/2014 Document Reviewed: 11/30/2012 Elsevier Interactive Patient Education 2016 Elsevier Inc.  

## 2015-09-14 NOTE — Progress Notes (Addendum)
By signing my name below, I, Raven Small, attest that this documentation has been prepared under the direction and in the presence of Arlyss Queen, MD.  Electronically Signed: Thea Alken, ED Scribe. 09/14/2015. 8:19 AM.   Chief Complaint:  Chief Complaint  Patient presents with  . Cough    greenish-yellow mucus  . Nasal Congestion    HPI: Jason [name] is a 65 y.o. male who reports to [facility] today complaining of cough and nasal congestion. Pt states symptoms started 2 days ago with body aches followed by cough and nasal congestion. He believes he may of had the flu. Pt states his head is "stopped up" making it difficult for him to hear. He has taken mucinex which he states made his cough productive with green mucous. He also reports nasal drainage after starting mucinex. Pt has been using and ear candle with minimal removal of wax. Pt has been drinking plenty of fluids. Pt smokes a pack a day.   Past Medical History  Diagnosis Date  . Coronary artery disease     CABG 2/11, cath x 4 since - med Rx  . Hypertension     type 2 NIDDM  . Diabetes mellitus   . Arthritis   . GERD (gastroesophageal reflux disease)   . High cholesterol   . Myocardial infarct (HCC)     x4 last 10 yrs  . Anxiety     off xanax  and paxil since 3/13  . COPD (chronic obstructive pulmonary disease) ([place])   . Pneumonia     hx  . Cancer ([place])     tumor basal cell rem from lft arm  . Smoker   . 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 Depression   . Substance abuse   . Cataract   . Heart attack ([place])   . Bilateral carotid artery disease ([place])     s/p R ICA stent 03/28/2015 with distal protection. Known chronically occluded L ICA. Carotid stent complicated by hypotension and acute stroke [place]  . CVA (cerebral infarction)     occured on 10/10 several days after R ICA carotid stenting   Past Surgical History  Procedure Laterality Date  . Femoral artery  - femoral artery bypass graft Right 2004    femoral enarterectomy  . US extremity*l*      lft arm tumor removed   . Cardiac catheterization  4/12    Medical Rx  . Coronary angioplasty  Jan 2004    RCA  . Back surgery  Jan 2014, Dec 2011    Dr Vertell Limber  . Coronary artery bypass graft  07/24/2009    L-LAD, SVG-RI/OM, SVG-PDA  . Eye surgery      cat bil  . Coronary angiogram  01/17/14    med rx  . Coronary angiogram  4/13    med Rx  . Coronary angiogram  1/15    Med Rx  . Left heart catheterization with coronary angiogram N/A 10/03/2011    Procedure: LEFT HEART CATHETERIZATION WITH CORONARY ANGIOGRAM;  Surgeon: Pixie Casino, MD;  Location: [facility] CATH LAB;  Service: Cardiovascular;  Laterality: N/A;  . Left heart catheterization with coronary angiogram N/A 07/08/2013    Procedure: LEFT HEART CATHETERIZATION WITH CORONARY ANGIOGRAM;  Surgeon: Blane Ohara, MD;  Location: [facility] CATH LAB;  Service: Cardiovascular;  Laterality: N/A;  . Left heart catheterization with coronary/graft angiogram N/A 01/17/2014    Procedure: LEFT HEART CATHETERIZATION  WITH Beatrix Fetters;  Surgeon: Troy Sine, MD;  Location: [facility] CATH LAB;  Service: Cardiovascular;  Laterality: N/A;  . Peripheral vascular catheterization N/A 03/28/2015    Procedure: Carotid PTA/Stent Intervention;  Surgeon: Lorretta Harp, MD;  Location: [facility] CV LAB;  Service: Cardiovascular;  Laterality: N/A;   Social History   Social History  . Marital Status: Single    Spouse Name: N/A  . Number of Children: 1  . Years of Education: 14   Occupational History  . welder-fabricator    Social History Main Topics  . Smoking status: Current Every Day Smoker -- 1.00 packs/day for 40 years    Types: Cigarettes  . Smokeless tobacco: Never Used  . Alcohol Use: 0.0 oz/week    0 Standard drinks or equivalent per week     Comment: socially  . Drug Use: No     Comment: hx of cocaine use  . Sexual Activity: Not Currently    Other Topics Concern  . None   Social History Narrative   Lives alone   Caffeine use: Drinks pepsi/coffee (32 oz diet pepsi per day)    Exercise walking 4 times per week for 1 mile   Family History  Problem Relation Age of Onset  . Arthritis Mother   . Heart disease Father    Allergies  Allergen Reactions  . Coreg [Carvedilol] Nausea And Vomiting and Other (See Comments)    Per patient made him dizzy and light sensitive  . Sulfa Antibiotics Nausea And Vomiting and Other (See Comments)    Also headaches    Prior to Admission medications   Medication Sig Start Date End Date Taking? Authorizing Provider  acetaminophen (TYLENOL) 325 MG tablet Take 2 tablets (650 mg total) by mouth every 4 (four) hours as needed for headache or mild pain. 01/18/14  Yes Luke K Kilroy, PA-C  ALPRAZolam Duanne Moron) 0.5 MG tablet Take 0.5-1 tablets (0.25-0.5 mg total) by mouth 2 (two) times daily as needed for anxiety. Or panic episode. 07/05/15  Yes [name], MD  aspirin 325 MG tablet Take 325 mg by mouth daily.   Yes Historical Provider, MD  aspirin EC 81 MG tablet Take 1 tablet (81 mg total) by mouth daily. 05/09/15  Yes Troy Sine, MD  atorvastatin (LIPITOR) 40 MG tablet Take 1 tablet (40 mg total) by mouth daily. 02/07/15  Yes Troy Sine, MD  clopidogrel (PLAVIX) 75 MG tablet Take 1 tablet (75 mg total) by mouth daily. 03/06/15  Yes Lorretta Harp, MD  Cyanocobalamin (VITAMIN B-12) 2000 MCG TBCR Take 1 tablet by mouth daily.   Yes Historical Provider, MD  levothyroxine (SYNTHROID, LEVOTHROID) 50 MCG tablet Take 1 tablet (50 mcg total) by mouth daily. 08/17/15  Yes [name], MD  loratadine (CLARITIN) 10 MG tablet Take 10 mg by mouth daily.   Yes Historical Provider, MD  metFORMIN (GLUCOPHAGE) 500 MG tablet TAKE 1 TABLET BY MOUTH TWICE A DAY (MUST HAVE APPOINTMENT FOR FURTHER REFILLS) 07/30/15  Yes [name], MD  metoprolol succinate (TOPROL XL) 50 MG 24 hr tablet Take 1/2 -1 tablet as  directed 05/09/15  Yes Troy Sine, MD  naproxen sodium (ANAPROX) 220 MG tablet Take 440 mg by mouth 2 (two) times daily as needed (for pain).   Yes Historical Provider, MD  nitroGLYCERIN (NITROSTAT) 0.4 MG SL tablet Place 1 tablet (0.4 mg total) under the tongue every 5 (five) minutes as needed for chest pain. 01/18/14  Yes Erlene Quan, PA-C  ranitidine (ZANTAC) 150 MG tablet Take 150 mg by mouth daily as needed for heartburn. For acid reflux   Yes Historical Provider, MD  ranolazine (RANEXA) 1000 MG SR tablet Take 1 tablet (1,000 mg total) by mouth 2 (two) times daily. 06/29/15  Yes Troy Sine, MD  sertraline (ZOLOFT) 50 MG tablet Take 50 mg by mouth daily.   Yes Historical Provider, MD     ROS: The patient denies night sweats, unintentional weight loss, chest pain, palpitations, wheezing, dyspnea on exertion, nausea, vomiting, abdominal pain, dysuria, hematuria, melena, numbness, weakness, or tingling.   All other systems have been reviewed and were otherwise negative with the exception of those mentioned in the HPI and as above.    PHYSICAL EXAM: Filed Vitals:   09/14/15 0816  BP: 132/76  Pulse: 76  Temp: 97.9 F (36.6 C)  Resp: 16   Body mass index is 24.65 kg/(m^2).   General: Alert, no acute distress HEENT:  Normocephalic, atraumatic, oropharynx patent. Wax in both ears.  Eye: Juliette Mangle [facility] Cardiovascular:  Regular rate and rhythm, no rubs murmurs or gallops.  No Carotid bruits, radial pulse intact. No pedal edema.  Respiratory: Clear to auscultation bilaterally.  No wheezes, rales, or rhonchi.  Very poor air exchange. Decrease breath sounds in both bases.  Abdominal: No organomegaly, abdomen is soft and non-tender, positive bowel sounds.  No masses. Musculoskeletal: Gait intact. No edema, tenderness Skin: No rashes. Neurologic: Facial musculature symmetric. Psychiatric: Patient acts appropriately throughout our interaction. Lymphatic: No cervical or submandibular  lymphadenopathy    LABS: Results for orders placed or performed in visit on 09/14/15  POCT Influenza A/B  Result Value Ref Range   Influenza A, POC Negative Negative   Influenza B, POC Negative Negative  POCT CBC  Result Value Ref Range   WBC 12.5 (A) 4.6 - 10.2 K/uL   Lymph, poc 3.2 0.6 - 3.4   POC LYMPH PERCENT 25.3 10 - 50 %L   MID (cbc) 0.6 0 - 0.9   POC MID % 5.2 0 - 12 %M   POC Granulocyte 8.7 (A) 2 - 6.9   Granulocyte percent 69.5 37 - 80 %G   RBC 4.03 (A) 4.69 - 6.13 M/uL   Hemoglobin 14.0 (A) 14.1 - 18.1 g/dL   HCT, POC 38.7 (A) 43.5 - 53.7 %   MCV 96.1 80 - 97 fL   MCH, POC 34.6 (A) 27 - 31.2 pg   MCHC 36.0 (A) 31.8 - 35.4 g/dL   RDW, POC 12.5 %   Platelet Count, POC 232 142 - 424 K/uL   MPV 6.0 0 - 99.8 fL   EKG/XRAY:   Dg Chest 2 View  09/14/2015  CLINICAL DATA:  Cough for 1 week, productive EXAM: CHEST  2 VIEW COMPARISON:  03/10/2015 FINDINGS: Heart size and vascular pattern are normal. Status post CABG. Lungs are clear. No pleural effusions. No airway narrowing. IMPRESSION: No active cardiopulmonary disease. Electronically Signed   By: Skipper Cliche M.D.   On: 09/14/2015 10:19    ASSESSMENT/PLAN: We'll treat with doxycycline fluids and Mucinex. White count is elevated flu tests are negative.I personally performed the services described in this documentation, which was scribed in my presence. The recorded information has been reviewed and is accurate.   Gross sideeffects, risk and benefits, and alternatives of medications d/w patient. Patient is aware that all medications have potential sideeffects and we are unable to predict every sideeffect or drug-drug interaction that  may occur.  Arlyss Queen MD 09/14/2015 8:19 AM

## 2015-09-18 ENCOUNTER — Other Ambulatory Visit: Payer: Self-pay | Admitting: Emergency Medicine

## 2015-09-20 ENCOUNTER — Encounter: Payer: Self-pay | Admitting: Cardiology

## 2015-09-20 ENCOUNTER — Ambulatory Visit (INDEPENDENT_AMBULATORY_CARE_PROVIDER_SITE_OTHER): Payer: Self-pay | Admitting: Cardiology

## 2015-09-20 VITALS — BP 142/78 | HR 60 | Ht 69.0 in | Wt 166.0 lb

## 2015-09-20 DIAGNOSIS — I25709 Atherosclerosis of coronary artery bypass graft(s), unspecified, with unspecified angina pectoris: Secondary | ICD-10-CM

## 2015-09-20 MED ORDER — ISOSORBIDE MONONITRATE ER 60 MG PO TB24: 90.0000 mg | ORAL_TABLET | Freq: Every day | ORAL | Status: DC

## 2015-09-20 NOTE — Patient Instructions (Signed)
Medication Instructions:  Your physician has recommended you make the following change in your medication:  INCREASE Imdur to 90mg  daily   Labwork: None ordered  Testing/Procedures: None ordered  Follow-Up: Your physician recommends that you schedule a follow-up appointment in: 3 months with Dr.Kelly   Any Other Special Instructions Will Be Listed Below (If Applicable). Call the office if you are still having chest discomfort after increasing Imdur     If you need a refill on your cardiac medications before your next appointment, please call your pharmacy.

## 2015-09-20 NOTE — Progress Notes (Signed)
09/20/2015 Jason [name]   [birth year]  [id number]  Primary Physician [name], [name], MD Primary Cardiologist: [name] PV: [name]   Reason for Visit/CC: Routien F/u for CAD and PVD.  HPI:  65 year old male, followed by Dr Claiborne Billings, who presents to clinic for routine f/u. He has a h/o CAD s/p CABG revascularization surgery in 2001. He has documented occlusion of the vein graft which previously had supplied the PDA and PLA branches of his RCA. At his last cardiac catheterization he had significant native CAD and continued to have a patent LIMA graft as well as a patent sequential graft supplying a ramus intermediate vessel and OM 2 vessel. he is on multiple antianginals including metoprolol, Imdur and maxed dose of Ranexa. He also has carotid artery disease which is followed by [name]. In Sep 2016, he underwent carotid dopplers which showed an occluded left internal carotid artery and high-grade right ICA stenosis. After discussing various options, he agreed to undergo revascularization procedure with carotid stent. He was started on 75 mg Plavix. He underwent the planned procedure on 03/28/2015 which showed occluded L ICA at its origin, 95% calcified R carotid artery filling R and L intracranial circulation, R carotid artery treated with distal protection and Xact Nitrol soft expanding stent (7/47mm x 3 cm). Postprocedure, he started having right facial, right arm, and right leg numbness. MRI was obtained which showed acute 11 x 4 mm right parietal lobe infarct in the watershed territory, small areas of biparietal and left occipital lobe encephalomalacia which may be post traumatic or postischemic, moderate global parenchymal brain volume loss, moderate white matter changes compatible with chronic small vessel ischemic disease. MRI of the neck showed left internal carotid artery occlusion, moderate luminal irregularities and stenosis of right carotid system siphon most compatible with  atherosclerosis. Carotid vascular ultrasound was obtained on 10/12 by stroke team which showed occluded L ICA and 50-75% stenosis in the low end of the range. He was followed and managed by neurology. His condition improved. Additional PMH includes HTN and HLD.  He notes occasional exertional chest pain with moderate to high intensity activity that resolves immediately after resting. No chest pain with minimal effort. No resting CP. He also denies dyspnea. He denies dizziness, syncope/ near syncope. No symptoms of stroke/TIA. He is fully complaint with meds.   Current Outpatient Prescriptions  Medication Sig Dispense Refill  . acetaminophen (TYLENOL) 325 MG tablet Take 2 tablets (650 mg total) by mouth every 4 (four) hours as needed for headache or mild pain.    Marland Kitchen ALPRAZolam (XANAX) 0.5 MG tablet Take 0.5-1 tablets (0.25-0.5 mg total) by mouth 2 (two) times daily as needed for anxiety. Or panic episode. 60 tablet 3  . aspirin 325 MG tablet Take 325 mg by mouth daily.    Marland Kitchen atorvastatin (LIPITOR) 40 MG tablet Take 1 tablet (40 mg total) by mouth daily. 30 tablet 6  . clopidogrel (PLAVIX) 75 MG tablet Take 1 tablet (75 mg total) by mouth daily. 90 tablet 3  . Cyanocobalamin (VITAMIN B-12) 2000 MCG TBCR Take 1 tablet by mouth daily.    Marland Kitchen doxycycline (VIBRAMYCIN) 100 MG capsule Take 1 capsule (100 mg total) by mouth 2 (two) times daily. 20 capsule 0  . levothyroxine (SYNTHROID, LEVOTHROID) 50 MCG tablet Take 1 tablet (50 mcg total) by mouth daily. 30 tablet 11  . loratadine (CLARITIN) 10 MG tablet Take 10 mg by mouth daily.    . metFORMIN (GLUCOPHAGE) 500 MG tablet TAKE  1 TABLET BY MOUTH TWICE A DAY (MUST HAVE APPOINTMENT FOR FURTHER REFILLS) 60 tablet 0  . metoprolol succinate (TOPROL XL) 50 MG 24 hr tablet Take 1/2 -1 tablet as directed 30 tablet 6  . ranitidine (ZANTAC) 150 MG tablet Take 150 mg by mouth daily as needed for heartburn. For acid reflux    . ranolazine (RANEXA) 1000 MG SR tablet Take 1  tablet (1,000 mg total) by mouth 2 (two) times daily. 60 tablet 5  . sertraline (ZOLOFT) 50 MG tablet Take 50 mg by mouth daily.    Marland Kitchen aspirin EC 81 MG tablet Take 1 tablet (81 mg total) by mouth daily. 90 tablet 3  . naproxen sodium (ANAPROX) 220 MG tablet Take 440 mg by mouth 2 (two) times daily as needed (for pain).    . nitroGLYCERIN (NITROSTAT) 0.4 MG SL tablet Place 1 tablet (0.4 mg total) under the tongue every 5 (five) minutes as needed for chest pain. 25 tablet 2   No current facility-administered medications for this visit.    Allergies  Allergen Reactions  . Coreg [Carvedilol] Nausea And Vomiting and Other (See Comments)    Per patient made him dizzy and light sensitive  . Sulfa Antibiotics Nausea And Vomiting and Other (See Comments)    Also headaches     Social History   Social History  . Marital Status: Single    Spouse Name: N/A  . Number of Children: 1  . Years of Education: 14   Occupational History  . welder-fabricator    Social History Main Topics  . Smoking status: Current Every Day Smoker -- 1.00 packs/day for 40 years    Types: Cigarettes  . Smokeless tobacco: Never Used  . Alcohol Use: 0.0 oz/week    0 Standard drinks or equivalent per week     Comment: socially  . Drug Use: No     Comment: hx of cocaine use  . Sexual Activity: Not Currently   Other Topics Concern  . Not on file   Social History Narrative   Lives alone   Caffeine use: Drinks pepsi/coffee (32 oz diet pepsi per day)    Exercise walking 4 times per week for 1 mile     Review of Systems: General: negative for chills, fever, night sweats or weight changes.  Cardiovascular: negative for chest pain, dyspnea on exertion, edema, orthopnea, palpitations, paroxysmal nocturnal dyspnea or shortness of breath Dermatological: negative for rash Respiratory: negative for cough or wheezing Urologic: negative for hematuria Abdominal: negative for nausea, vomiting, diarrhea, bright red blood  per rectum, melena, or hematemesis Neurologic: negative for visual changes, syncope, or dizziness All other systems reviewed and are otherwise negative except as noted above.    Blood pressure 142/78, pulse 60, height 5\' 9"  (1.753 m), weight 166 lb (75.297 kg), SpO2 97 %.  General appearance: alert, cooperative and no distress Neck: no carotid bruit and no JVD Lungs: clear to auscultation bilaterally Heart: regular rate and rhythm, S1, S2 normal, no murmur, click, rub or gallop Extremities: no LEE Pulses: 2+ and symmetric Skin: warm and dry Neurologic: Grossly normal  EKG NSR. With RBBB. Unchanged from prior EKG.   ASSESSMENT AND PLAN:    1. CAD: he notes occasional exertional chest pain with moderate to high intensity activity that resolves immediately after resting. No chest pain with minimal effort. No resting CP. He also denies dyspnea. We will continue current meds and will also further increase his Imdur to 90 mg daily given stable  angina. Patient advised to contact our office he he develops unstable angina.   2. HTN: BP is mildly elevated at 142/78. Given concerns for stable angina, we will increase Imdur to 90 mg daily, which will also help with BP.  3. Carotid Artery Disease: known total occlusion on the left, s/p stenting of the right ICA 02/2015. This is stable. He denies dizziness, syncope/ near syncope. No symptoms of stroke/TIA. This will continue to be followed by [name].   4. HLD: on statin therapy with lipitor. Last lipid panel 10/16 showed LDL to be close to goal at 72 mg/dL. This will need to be rechecked in October.   PLAN  Increase Imdur to 90 mg as outlined above. F/u with [name] in 3 months.   Lyda Jester PA-C 09/20/2015 2:14 PM

## 2015-10-11 ENCOUNTER — Telehealth: Payer: Self-pay | Admitting: *Deleted

## 2015-10-11 ENCOUNTER — Ambulatory Visit (INDEPENDENT_AMBULATORY_CARE_PROVIDER_SITE_OTHER): Payer: Self-pay | Admitting: Emergency Medicine

## 2015-10-11 ENCOUNTER — Encounter: Payer: Self-pay | Admitting: Emergency Medicine

## 2015-10-11 VITALS — BP 130/80 | HR 81 | Temp 97.9°F | Resp 16 | Ht 69.0 in | Wt 162.4 lb

## 2015-10-11 DIAGNOSIS — F419 Anxiety disorder, unspecified: Secondary | ICD-10-CM

## 2015-10-11 DIAGNOSIS — Z720 Tobacco use: Secondary | ICD-10-CM

## 2015-10-11 DIAGNOSIS — F329 Major depressive disorder, single episode, unspecified: Secondary | ICD-10-CM

## 2015-10-11 DIAGNOSIS — E119 Type 2 diabetes mellitus without complications: Secondary | ICD-10-CM

## 2015-10-11 DIAGNOSIS — F32A Depression, unspecified: Secondary | ICD-10-CM

## 2015-10-11 DIAGNOSIS — F17200 Nicotine dependence, unspecified, uncomplicated: Secondary | ICD-10-CM

## 2015-10-11 DIAGNOSIS — C4490 Unspecified malignant neoplasm of skin, unspecified: Secondary | ICD-10-CM

## 2015-10-11 DIAGNOSIS — F418 Other specified anxiety disorders: Secondary | ICD-10-CM

## 2015-10-11 LAB — POCT GLYCOSYLATED HEMOGLOBIN (HGB A1C): HEMOGLOBIN A1C: 6.9

## 2015-10-11 MED ORDER — ALPRAZOLAM 0.5 MG PO TABS: 0.2500 mg | ORAL_TABLET | Freq: Two times a day (BID) | ORAL | Status: DC | PRN

## 2015-10-11 NOTE — Telephone Encounter (Signed)
Pt walk-in asking for Ranexa samples. There are no Ranexa samples at this time.  Verified that pt still had refills at the pharmacy and he said yes he did.  Told him to check back at a later time.

## 2015-10-11 NOTE — Progress Notes (Addendum)
Patient ID: Jason [name], male   DOB: 09-Apr-1951, 65 y.o.   MRN: [id number]     By signing my name below, I, Zola Button, attest that this documentation has been prepared under the direction and in the presence of Arlyss Queen, MD.  Electronically Signed: Zola Button, Medical Scribe. 10/11/2015. 3:00 PM.   Chief Complaint:  Chief Complaint  Patient presents with  . check ears    feel clogged x 1 month  . Medication Refill  . Depression scale during triage    score 17  . Diabetes    HPI: Jason [name] is a 65 y.o. male with a history of tumor basal cell (removed from left arm), DM, CAD, PVD, carotid stenosis, RBBB, substance abuse, and anxiety who reports to [facility] today for a follow-up. Patient recently saw his cardiologist 3 weeks ago and will follow-up in 3 months. He does have chest pain with overexertion. He does have NTG to use if needed. Patient would also like his ears examined. He takes Xanax 0.5 mg, 0.5-1 tablets, twice a day. He states he has stayed clean from illicit drugs for years now.  He is on Social Security disability, but he has not received his Medicaid card yet. He plans to follow-up with dermatology once he has his insurance taken care of.   Past Medical History  Diagnosis Date  . Coronary artery disease     CABG 2/11, cath x 4 since - med Rx  . Hypertension     type 2 NIDDM  . Diabetes mellitus   . Arthritis   . GERD (gastroesophageal reflux disease)   . High cholesterol   . Myocardial infarct (HCC)     x4 last 10 yrs  . Anxiety     off xanax  and paxil since 3/13  . COPD (chronic obstructive pulmonary disease) ([place])   . Pneumonia     hx  . Cancer ([place])     tumor basal cell rem from lft arm  . Smoker   . 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 Depression   . Substance abuse   . Cataract   . Heart attack ([place])   . Bilateral carotid artery disease ([place])     s/p R ICA stent 03/28/2015 with distal protection.  Known chronically occluded L ICA. Carotid stent complicated by hypotension and acute stroke in watershed territory  . CVA (cerebral infarction)     occured on 10/10 several days after R ICA carotid stenting   Past Surgical History  Procedure Laterality Date  . Femoral artery - femoral artery bypass graft Right 2004    femoral enarterectomy  . US extremity*l*      lft arm tumor removed   . Cardiac catheterization  4/12    Medical Rx  . Coronary angioplasty  Jan 2004    RCA  . Back surgery  Jan 2014, Dec 2011    Dr Vertell Limber  . Coronary artery bypass graft  07/24/2009    L-LAD, SVG-RI/OM, SVG-PDA  . Eye surgery      cat bil  . Coronary angiogram  01/17/14    med rx  . Coronary angiogram  4/13    med Rx  . Coronary angiogram  1/15    Med Rx  . Left heart catheterization with coronary angiogram N/A 10/03/2011    Procedure: LEFT HEART CATHETERIZATION WITH CORONARY ANGIOGRAM;  Surgeon: Pixie Casino, MD;  [facility] LAB;  Service: Cardiovascular;  Laterality: N/A;  . Left heart catheterization with coronary angiogram N/A 07/08/2013    Procedure: LEFT HEART CATHETERIZATION WITH CORONARY ANGIOGRAM;  Surgeon: Blane Ohara, MD;  Location: [facility] CATH LAB;  Service: Cardiovascular;  Laterality: N/A;  . Left heart catheterization with coronary/graft angiogram N/A 01/17/2014    Procedure: LEFT HEART CATHETERIZATION WITH Beatrix Fetters;  Surgeon: Troy Sine, MD;  Location: [facility] CATH LAB;  Service: Cardiovascular;  Laterality: N/A;  . Peripheral vascular catheterization N/A 03/28/2015    Procedure: Carotid PTA/Stent Intervention;  Surgeon: Lorretta Harp, MD;  Location: [facility] CV LAB;  Service: Cardiovascular;  Laterality: N/A;   Social History   Social History  . Marital Status: Single    Spouse Name: N/A  . Number of Children: 1  . Years of Education: 14   Occupational History  . welder-fabricator    Social History Main Topics  . Smoking status: Current Every Day  Smoker -- 1.00 packs/day for 40 years    Types: Cigarettes  . Smokeless tobacco: Never Used  . Alcohol Use: 0.0 oz/week    0 Standard drinks or equivalent per week     Comment: socially  . Drug Use: No     Comment: hx of cocaine use  . Sexual Activity: Not Currently   Other Topics Concern  . None   Social History Narrative   Lives alone   Caffeine use: Drinks pepsi/coffee (32 oz diet pepsi per day)    Exercise walking 4 times per week for 1 mile   Family History  Problem Relation Age of Onset  . Arthritis Mother   . Heart disease Father    Allergies  Allergen Reactions  . Coreg [Carvedilol] Nausea And Vomiting and Other (See Comments)    Per patient made him dizzy and light sensitive  . Sulfa Antibiotics Nausea And Vomiting and Other (See Comments)    Also headaches    Prior to Admission medications   Medication Sig Start Date End Date Taking? Authorizing Provider  acetaminophen (TYLENOL) 325 MG tablet Take 2 tablets (650 mg total) by mouth every 4 (four) hours as needed for headache or mild pain. 01/18/14  Yes Luke K Kilroy, PA-C  ALPRAZolam Duanne Moron) 0.5 MG tablet Take 0.5-1 tablets (0.25-0.5 mg total) by mouth 2 (two) times daily as needed for anxiety. Or panic episode. 07/05/15  Yes [name], MD  aspirin 325 MG tablet Take 325 mg by mouth daily.   Yes Historical Provider, MD  atorvastatin (LIPITOR) 40 MG tablet Take 1 tablet (40 mg total) by mouth daily. 02/07/15  Yes Troy Sine, MD  clopidogrel (PLAVIX) 75 MG tablet Take 1 tablet (75 mg total) by mouth daily. 03/06/15  Yes Lorretta Harp, MD  Cyanocobalamin (VITAMIN B-12) 2000 MCG TBCR Take 1 tablet by mouth daily.   Yes Historical Provider, MD  isosorbide mononitrate (IMDUR) 60 MG 24 hr tablet Take 1.5 tablets (90 mg total) by mouth daily. 09/20/15  Yes Brittainy Erie Noe, PA-C  levothyroxine (SYNTHROID, LEVOTHROID) 50 MCG tablet Take 1 tablet (50 mcg total) by mouth daily. 08/17/15  Yes [name], MD    loratadine (CLARITIN) 10 MG tablet Take 10 mg by mouth daily.   Yes Historical Provider, MD  metFORMIN (GLUCOPHAGE) 500 MG tablet Take 1 tablet (500 mg total) by mouth 2 (two) times daily with a meal. 09/20/15  Yes [name], MD  metoprolol succinate (TOPROL XL) 50 MG 24 hr tablet Take 1/2 -1 tablet as  directed 05/09/15  Yes Troy Sine, MD  naproxen sodium (ANAPROX) 220 MG tablet Take 440 mg by mouth 2 (two) times daily as needed (for pain).   Yes Historical Provider, MD  nitroGLYCERIN (NITROSTAT) 0.4 MG SL tablet Place 1 tablet (0.4 mg total) under the tongue every 5 (five) minutes as needed for chest pain. 01/18/14  Yes Luke K Kilroy, PA-C  ranitidine (ZANTAC) 150 MG tablet Take 150 mg by mouth daily as needed for heartburn. For acid reflux   Yes Historical Provider, MD  ranolazine (RANEXA) 1000 MG SR tablet Take 1 tablet (1,000 mg total) by mouth 2 (two) times daily. 06/29/15  Yes Troy Sine, MD  sertraline (ZOLOFT) 50 MG tablet Take 50 mg by mouth daily.   Yes Historical Provider, MD  aspirin EC 81 MG tablet Take 1 tablet (81 mg total) by mouth daily. Patient not taking: Reported on 10/11/2015 05/09/15   Troy Sine, MD     ROS: The patient denies fevers, chills, night sweats, unintentional weight loss, palpitations, wheezing, dyspnea on exertion, nausea, vomiting, abdominal pain, dysuria, hematuria, melena, numbness, weakness, or tingling.   All other systems have been reviewed and were otherwise negative with the exception of those mentioned in the HPI and as above.    PHYSICAL EXAM: Filed Vitals:   10/11/15 1442  BP: 130/80  Pulse: 81  Temp: 97.9 F (36.6 C)  Resp: 16   Body mass index is 23.97 kg/(m^2).   General: Alert, no acute distress HEENT:  Normocephalic, atraumatic, oropharynx patent. Wax impaction left external auditory canal. Eye: EOMI, PEERLDC Cardiovascular:  Regular rate and rhythm, no rubs murmurs or gallops.  No Carotid bruits noted, radial pulse  intact. No pedal edema.  Respiratory: Decreased breath sounds in the bases, but no wheezes. Abdominal: No organomegaly, abdomen is soft and non-tender, positive bowel sounds.  No masses. Musculoskeletal: Gait intact. No edema, tenderness Skin: Healed scar over the right carotid. 1 cm what appears to be basal cell cancer, right side of the nose. Neurologic: Facial musculature symmetric. Psychiatric: Patient acts appropriately throughout our interaction. Lymphatic: No cervical or submandibular lymphadenopathy    LABS:  Results for orders placed or performed in visit on 10/11/15  POCT glycosylated hemoglobin (Hb A1C)  Result Value Ref Range   Hemoglobin A1C 6.9     EKG/XRAY:   Primary read interpreted by Dr. Everlene Farrier at [facility].   ASSESSMENT/PLAN:   Considering his dietary indiscretionsI will be satisfied with 6.9. He is encouraged to be more careful with his diet. I did not make any medication ad given a refill on his Xanax. We'll recheck in 3-4 months at 102..I personally performed the services described in this documentation, which was scribed in my presence. The recorded information has been reviewed and is accurate.eferral made to Dr. Rhona Raider at [facility] for the skin lesion on the right side of his face. He will try nicotine patches to get off cigarettes.  Gross sideeffects, risk and benefits, and alternatives of medications d/w patient. Patient is aware that all medications have potential sideeffects and we are unable to predict every sideeffect or drug-drug interaction that may occur.  Arlyss Queen MD 10/11/2015 3:00 PM

## 2015-11-27 ENCOUNTER — Other Ambulatory Visit: Payer: Self-pay | Admitting: Cardiology

## 2015-11-27 NOTE — Telephone Encounter (Signed)
Rx(s) sent to pharmacy electronically.  

## 2015-11-30 ENCOUNTER — Other Ambulatory Visit: Payer: Self-pay | Admitting: Emergency Medicine

## 2015-12-28 ENCOUNTER — Other Ambulatory Visit: Payer: Self-pay | Admitting: Cardiovascular Disease

## 2015-12-28 NOTE — Telephone Encounter (Signed)
Rx(s) sent to pharmacy electronically.  

## 2016-01-04 ENCOUNTER — Encounter: Payer: Self-pay | Admitting: Cardiovascular Disease

## 2016-01-04 ENCOUNTER — Ambulatory Visit (INDEPENDENT_AMBULATORY_CARE_PROVIDER_SITE_OTHER): Payer: Self-pay | Admitting: Cardiovascular Disease

## 2016-01-04 VITALS — BP 163/82 | HR 80 | Ht 69.0 in | Wt 166.2 lb

## 2016-01-04 DIAGNOSIS — I2510 Atherosclerotic heart disease of native coronary artery without angina pectoris: Secondary | ICD-10-CM

## 2016-01-04 DIAGNOSIS — E785 Hyperlipidemia, unspecified: Secondary | ICD-10-CM

## 2016-01-04 DIAGNOSIS — I4510 Unspecified right bundle-branch block: Secondary | ICD-10-CM

## 2016-01-04 DIAGNOSIS — I2583 Coronary atherosclerosis due to lipid rich plaque: Secondary | ICD-10-CM

## 2016-01-04 DIAGNOSIS — E1159 Type 2 diabetes mellitus with other circulatory complications: Secondary | ICD-10-CM

## 2016-01-04 DIAGNOSIS — I6521 Occlusion and stenosis of right carotid artery: Secondary | ICD-10-CM

## 2016-01-04 DIAGNOSIS — I10 Essential (primary) hypertension: Secondary | ICD-10-CM

## 2016-01-04 DIAGNOSIS — E039 Hypothyroidism, unspecified: Secondary | ICD-10-CM

## 2016-01-04 MED ORDER — METOPROLOL SUCCINATE ER 100 MG PO TB24: 100.0000 mg | ORAL_TABLET | Freq: Every day | ORAL | Status: DC

## 2016-01-04 NOTE — Patient Instructions (Addendum)
Your physician has recommended you make the following change in your medication:   1.) the metoprolol succ has been increased. Take 75 mg ( 1& 1/2 of the 50 mg tablet) daily for 1 week then start new 100 mg tablet written today.  Your physician has requested that you have a carotid duplex. This test is an ultrasound of the carotid arteries in your neck. It looks at blood flow through these arteries that supply the brain with blood. Allow one hour for this exam. There are no restrictions or special instructions. THIS WILL BE DONE IN October.   Your physician recommends that you return for lab work in: October.  Your physician recommends that you schedule a follow-up appointment in: [facility]

## 2016-01-06 ENCOUNTER — Encounter: Payer: Self-pay | Admitting: Cardiovascular Disease

## 2016-01-06 NOTE — Progress Notes (Signed)
Patient ID: Jason [name], male   DOB: 05/29/1951, 65 y.o.   MRN: [id number]    Primary M.D.: Dr. Arlyss Queen  HPI: Jason [name] is a 65 y.o. male who presents to the office today for an 8 month follow up cardiology evaluation.  Jason [name] has a history of CAD and in 2001 underwent CABG revascularization surgery.  He has a history of hypertension, diabetes mellitus, COPD, and carotid disease.  In January 2015 he underwent cardiac catheterization which revealed a patent LIMA graft to his LAD, a patent sequential graft to marginal vessels, and an occluded sequential graft which previously had supplied the PDA and PLA vessels of the RCA and there was evidence for mild left-to-right collateralization.  In July 2015.  He was admitted with recurrent anginal symptomatology.  Repeat catheterization revealed mild LV dysfunction with mid to basal inferior hypocontractility.  Ejection fraction was 50%.  He had significant native CAD with coronary calcification, 70% ostial and distal left main stenoses, 50% stenosis in the first diagonal branch was LAD, 80% mid LAD stenosis, and occluded high marginal/ramus intermediate vessel, 80% AV groove left circumflex stenosis and total proximal RCA occlusion.  He is a patent LIMA to the LAD, a patent sequential graft to a high marginal, a known 2 vessel in the vein graft to its previously supplied the PDA and PLA vessel remained occluded.  Increased medical therapy was recommended.  When I saw him in August 2016 he had begun to notice more episodes of chest pain.  He had been on Ranexa 500 mg twice a day, isosorbide 30 mg, and aspirin. I titrated his Ranexa to 1000 mg bid and addedd low dose metoprolol. He had a loud carotid bruit and I referred him for carotid duplex imaging. He was [name] to have an occluded L ICA and high grade R ICA stenosis.  I referred him to Dr. Gwenlyn [name] and due to his high risk for carotid endartectomy he underwent successful right internal carotid  artery PTA and stenting using a nitinol self expanding stent and distal carotid embolic protection in the setting of an occluded contralateral carotid artery and high risk coronary anatomy. He tolerated the procedure well.   Jason [name] underwent a 2-D echo Doppler study on 03/31/2015.  This showed an EF of 60-65%.  There was mild LVH with basal inferoposterior hypokinesis to akinesis and grade 1 diastolic dysfunction.  He denies chest pain, PND, neurologic sx.  His pulse has been faster. He is still smoking, although considerably less than previously.   Since I last saw him, he denies any recurrent episodes of significant chest pain.  He has been on Toprol XL 50 mg daily, isosorbide 90 mg, and ranolazine 1000 mg twice a day.  He is on atorvastatin 40 mg for hyperlipidemia.  He continues to be on antiplatelet therapy with aspirin and Plavix 75 mg, but instead of taking 81 mg he has been taking 325 mg.  He is on levothyroxine 50 g for hypothyroidism and he has type 2 diabetes mellitus on metformin.  He denies any paresthesias or motor weakness.  He presents for evaluation  Past Medical History  Diagnosis Date  . Coronary artery disease     CABG 2/11, cath x 4 since - med Rx  . Hypertension     type 2 NIDDM  . Diabetes mellitus   . Arthritis   . GERD (gastroesophageal reflux disease)   . High cholesterol   . Myocardial infarct ([place])  x4 last 10 yrs  . Anxiety     off xanax  and paxil since 3/13  . COPD (chronic obstructive pulmonary disease) ([place])   . Pneumonia     hx  . Cancer ([place])     tumor basal cell rem from lft arm  . Smoker   . 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 Depression   . Substance abuse   . Cataract   . Heart attack ([place])   . Bilateral carotid artery disease ([place])     s/p R ICA stent 03/28/2015 with distal protection. Known chronically occluded L ICA. Carotid stent complicated by hypotension and acute stroke in [place]    . CVA (cerebral infarction)     occured on 10/10 several days after R ICA carotid stenting    Past Surgical History  Procedure Laterality Date  . Femoral artery - femoral artery bypass graft Right 2004    femoral enarterectomy  . US extremity*l*      lft arm tumor removed   . Cardiac catheterization  4/12    Medical Rx  . Coronary angioplasty  Jan 2004    RCA  . Back surgery  Jan 2014, Dec 2011    Dr Vertell Limber  . Coronary artery bypass graft  07/24/2009    L-LAD, SVG-RI/OM, SVG-PDA  . Eye surgery      cat bil  . Coronary angiogram  01/17/14    med rx  . Coronary angiogram  4/13    med Rx  . Coronary angiogram  1/15    Med Rx  . Left heart catheterization with coronary angiogram N/A 10/03/2011    Procedure: LEFT HEART CATHETERIZATION WITH CORONARY ANGIOGRAM;  Surgeon: Pixie Casino, MD;  Location: [facility] CATH LAB;  Service: Cardiovascular;  Laterality: N/A;  . Left heart catheterization with coronary angiogram N/A 07/08/2013    Procedure: LEFT HEART CATHETERIZATION WITH CORONARY ANGIOGRAM;  Surgeon: Blane Ohara, MD;  Location: [facility] CATH LAB;  Service: Cardiovascular;  Laterality: N/A;  . Left heart catheterization with coronary/graft angiogram N/A 01/17/2014    Procedure: LEFT HEART CATHETERIZATION WITH Beatrix Fetters;  Surgeon: Troy Sine, MD;  Location: [facility] CATH LAB;  Service: Cardiovascular;  Laterality: N/A;  . Peripheral vascular catheterization N/A 03/28/2015    Procedure: Carotid PTA/Stent Intervention;  Surgeon: Lorretta Harp, MD;  Location: [place] CV LAB;  Service: Cardiovascular;  Laterality: N/A;    Allergies  Allergen Reactions  . Coreg [Carvedilol] Nausea And Vomiting and Other (See Comments)    Per patient made him dizzy and light sensitive  . Sulfa Antibiotics Nausea And Vomiting and Other (See Comments)    Also headaches     Current Outpatient Prescriptions  Medication Sig Dispense Refill  . acetaminophen (TYLENOL) 325 MG tablet Take 2  tablets (650 mg total) by mouth every 4 (four) hours as needed for headache or mild pain.    Marland Kitchen ALPRAZolam (XANAX) 0.5 MG tablet Take 0.5-1 tablets (0.25-0.5 mg total) by mouth 2 (two) times daily as needed for anxiety. Or panic episode. 60 tablet 3  . aspirin 325 MG tablet Take 325 mg by mouth daily.    Marland Kitchen aspirin EC 81 MG tablet Take 1 tablet (81 mg total) by mouth daily. 90 tablet 3  . atorvastatin (LIPITOR) 40 MG tablet Take 1 tablet (40 mg total) by mouth daily. 30 tablet 6  . clopidogrel (PLAVIX) 75 MG tablet Take 1 tablet (75 mg total) by  mouth daily. 90 tablet 3  . Cyanocobalamin (VITAMIN B-12) 2000 MCG TBCR Take 1 tablet by mouth daily.    . isosorbide mononitrate (IMDUR) 60 MG 24 hr tablet Take 1.5 tablets (90 mg total) by mouth daily. 135 tablet 1  . levothyroxine (SYNTHROID, LEVOTHROID) 50 MCG tablet Take 1 tablet (50 mcg total) by mouth daily. 30 tablet 11  . loratadine (CLARITIN) 10 MG tablet Take 10 mg by mouth daily.    . metFORMIN (GLUCOPHAGE) 500 MG tablet TAKE 1 TABLET BY MOUTH TWICE A DAY WITH A MEAL 180 tablet 1  . naproxen sodium (ANAPROX) 220 MG tablet Take 440 mg by mouth 2 (two) times daily as needed (for pain).    Marland Kitchen NITROSTAT 0.4 MG SL tablet PLACE 1 TABLET (0.4 MG TOTAL) UNDER THE TONGUE EVERY 5 (FIVE) MINUTES AS NEEDED FOR CHEST PAIN. 25 tablet 1  . ranitidine (ZANTAC) 150 MG tablet Take 150 mg by mouth daily as needed for heartburn. For acid reflux    . ranolazine (RANEXA) 1000 MG SR tablet Take 1 tablet (1,000 mg total) by mouth 2 (two) times daily. 60 tablet 5  . sertraline (ZOLOFT) 50 MG tablet Take 50 mg by mouth daily.    . metoprolol succinate (TOPROL XL) 100 MG 24 hr tablet Take 1 tablet (100 mg total) by mouth daily. Take with or immediately following a meal. 30 tablet 6   No current facility-administered medications for this visit.    Social History   Social History  . Marital Status: Single    Spouse Name: N/A  . Number of Children: 1  . Years of  Education: 14   Occupational History  . welder-fabricator    Social History Main Topics  . Smoking status: Current Every Day Smoker -- 1.00 packs/day for 40 years    Types: Cigarettes  . Smokeless tobacco: Never Used  . Alcohol Use: 0.0 oz/week    0 Standard drinks or equivalent per week     Comment: socially  . Drug Use: No     Comment: hx of cocaine use  . Sexual Activity: Not Currently   Other Topics Concern  . Not on file   Social History Narrative   Lives alone   Caffeine use: Drinks pepsi/coffee (32 oz diet pepsi per day)    Exercise walking 4 times per week for 1 mile    Family History  Problem Relation Age of Onset  . Arthritis Mother   . Heart disease Father     ROS General: Negative; No fevers, chills, or night sweats HEENT: Negative; No changes in vision or hearing, sinus congestion, difficulty swallowing Pulmonary: Negative; No cough, wheezing, shortness of breath, hemoptysis Cardiovascular: See HPI:  GI: Negative; No nausea, vomiting, diarrhea, or abdominal pain GU: Negative; No dysuria, hematuria, or difficulty voiding Musculoskeletal: Occasional muscle cramps. Hematologic: Negative; no easy bruising, bleeding Endocrine: Negative; no heat/cold intolerance; no diabetes, Neuro: Negative; no changes in balance, headaches Skin: Negative; No rashes or skin lesions Psychiatric: Negative; No behavioral problems, depression Sleep: Negative; No snoring,  daytime sleepiness, hypersomnolence, bruxism, restless legs, hypnogognic hallucinations. Other comprehensive 14 point system review is negative   Physical Exam BP 163/82 mmHg  Pulse 80  Ht _0  (1.753 m)  Wt 166 lb 3.2 oz (75.388 kg)  BMI 24.53 kg/m2   Repeat blood pressure by me was 160/84.  Wt Readings from Last 3 Encounters:  01/04/16 166 lb 3.2 oz (75.388 kg)  10/11/15 162 lb 6.4 oz (73.664 kg)  09/20/15 166 lb (75.297 kg)   General: Alert, oriented, no distress.  Skin: normal turgor, no  rashes, warm and dry HEENT: Normocephalic, atraumatic. Pupils equal round and reactive to light; sclera anicteric; extraocular muscles intact, No lid lag; Nose without nasal septal hypertrophy; Mouth/Parynx benign; Mallinpatti scale 3 Neck: No JVD, resolution of prior loud right carotid bruit; normal carotid upstroke Lungs: clear to ausculatation and percussion bilaterally; no wheezing or rales, normal inspiratory and expiratory effort Chest wall: without tenderness to palpitation Heart: PMI not displaced, RRR, s1 s2 normal, 1/6 systolic murmur, No diastolic murmur, no rubs, gallops, thrills, or heaves Abdomen: soft, nontender; no hepatosplenomehaly, BS+; abdominal aorta nontender and not dilated by palpation. Back: no CVA tenderness Pulses: 2+ , soft femoral bruits Musculoskeletal: full range of motion, normal strength, no joint deformities Extremities: Pulses 2+, no clubbing cyanosis or edema, Homan's sign negative  Neurologic: grossly nonfocal; Cranial nerves grossly wnl Psychologic: Normal mood and affect  ECG (independently read by me): Sinus rhythm at 80 bpm with an occasional PVC.  Right bundle branch block.  Q wave in lead 3.  November 2016 ECG (independently read by me): NSR at 95; RBBB with repolariztion changes  August 2016 ECG (independently read by me): Normal sinus rhythm at 78 bpm.  Right bundle branch block with repolarization changes.  LABS:  BMP Latest Ref Rng 07/12/2015 04/03/2015 04/01/2015  Glucose 65 - 99 mg/dL 173(H) 122(H) 142(H)  BUN 7 - 25 mg/dL _0 Creatinine 0.70 - 1.25 mg/dL 1.14 1.52(H) 1.33(H)  Sodium 135 - 146 mmol/L 131(L) 132(L) 130(L)  Potassium 3.5 - 5.3 mmol/L 4.9 4.4 3.9  Chloride 98 - 110 mmol/L 100 96(L) 96(L)  CO2 20 - 31 mmol/L _1 Calcium 8.6 - 10.3 mg/dL 9.9 9.7 9.3     Hepatic Function Latest Ref Rng 03/28/2015 10/03/2011 07/24/2009  Total Protein 6.5 - 8.1 g/dL 6.2(L) 5.8(L) 5.6(L)  Albumin 3.5 - 5.0 g/dL 3.5 3.2(L) 3.2(L)  AST  15 - 41 U/L _2 ALT 17 - 63 U/L _3 Alk Phosphatase 38 - 126 U/L 69 101 74  Total Bilirubin 0.3 - 1.2 mg/dL 0.5 0.1(L) 0.8    CBC Latest Ref Rng 09/14/2015 03/29/2015 03/28/2015  WBC 4.6 - 10.2 K/uL 12.5(A) 12.4(H) 11.1(H)  Hemoglobin 14.1 - 18.1 g/dL 14.0(A) 13.6 13.3  Hematocrit 43.5 - 53.7 % 38.7(A) 40.0 39.0  Platelets 150 - 400 K/uL - 229 221   Lab Results  Component Value Date   MCV 96.1 09/14/2015   MCV 97.1 03/29/2015   MCV 97.3 03/28/2015    Lab Results  Component Value Date   TSH 4.670* 08/06/2015    BNP No results [name] for: BNP  ProBNP    Component Value Date/Time   PROBNP 77.9 07/07/2013 2033     Lipid Panel     Component Value Date/Time   CHOL 136 04/04/2015 0645   TRIG 148 04/04/2015 0645   HDL 34* 04/04/2015 0645   CHOLHDL 4.0 04/04/2015 0645   VLDL 30 04/04/2015 0645   LDLCALC 72 04/04/2015 0645     RADIOLOGY: No results [name].    ASSESSMENT AND PLAN: Jason [name] is a 65 year old male who underwent CABG revascularization surgery in 2001.  He has documented occlusion of the vein graft which previously had supplied the PDA and PLA branches of his RCA. At his last cardiac catheterization he has significant native CAD and continued to have a patent LIMA graft  as well as a patent sequential graft supplying a ramus intermediate, like vessel and OM 2 vessel.  He has not had any further anginal symptoms on increased dose of Ranexa and beta blocker therapy.   He underwent very complex peripheral intervention to a high-grade ostial right internal carotid artery stenosis in the setting of having an occluded left internal carotid artery.  When I last saw him, I resumed his beta blocker therapy since he had not restarted this after his carotid procedure.  His blood pressure today continues to be elevated.  I will further titrate his Toprol 5 mg for one week and then he will titrate this to 100 mg daily.  This should also suppresses ectopy.   Lipid studies are now excellent with a total cholesterol 136, LDL 72, triglycerides 148, although HDL is low at 34.  This is markedly improved from one year ago when his cholesterol was over 200, triglycerides 385, and LDL 123.  I again have recommended he change his aspirin to 81 mg instead of 325 mg and he will continue to take Plavix 75 mg daily.  In October, he will undergo a one-year follow-up carotid duplex carotid study and will undergo repeat fasting laboratory.  I will see him in November for follow-up evaluation.   Time spent: 25 minutes  Troy Sine, MD, [facility]  01/06/2016 11:15 PM

## 2016-02-04 ENCOUNTER — Ambulatory Visit: Payer: Self-pay | Admitting: Neurology

## 2016-02-11 ENCOUNTER — Encounter: Payer: Self-pay | Admitting: Neurology

## 2016-02-24 ENCOUNTER — Other Ambulatory Visit: Payer: Self-pay | Admitting: Emergency Medicine

## 2016-02-29 ENCOUNTER — Ambulatory Visit: Payer: Self-pay

## 2016-03-05 ENCOUNTER — Other Ambulatory Visit: Payer: Self-pay | Admitting: Emergency Medicine

## 2016-03-05 DIAGNOSIS — F419 Anxiety disorder, unspecified: Principal | ICD-10-CM

## 2016-03-05 DIAGNOSIS — F329 Major depressive disorder, single episode, unspecified: Secondary | ICD-10-CM

## 2016-03-06 NOTE — Telephone Encounter (Signed)
Last OV in April and was given 4 mos of RFs. Plan was to re-check in 3-4 mos. Pended 1 mos w/note that pt needs OV for more.

## 2016-04-01 ENCOUNTER — Other Ambulatory Visit: Payer: Self-pay | Admitting: Cardiovascular Disease

## 2016-04-01 ENCOUNTER — Other Ambulatory Visit: Payer: Self-pay | Admitting: Emergency Medicine

## 2016-04-04 ENCOUNTER — Other Ambulatory Visit: Payer: Self-pay | Admitting: Cardiovascular Disease

## 2016-04-08 ENCOUNTER — Telehealth: Payer: Self-pay

## 2016-04-08 DIAGNOSIS — F329 Major depressive disorder, single episode, unspecified: Secondary | ICD-10-CM

## 2016-04-08 DIAGNOSIS — F419 Anxiety disorder, unspecified: Principal | ICD-10-CM

## 2016-04-08 NOTE — Telephone Encounter (Signed)
Patient is calling to request a refill for alprazolam.  [phone]

## 2016-04-11 NOTE — Telephone Encounter (Signed)
Called patient and left VM for him to return the call.

## 2016-04-11 NOTE — Telephone Encounter (Signed)
Patient is calling back again because he hasn't received a phone call back. Patient states that he also wanted refills for sertraline and zoloft along with alprazolam.

## 2016-04-11 NOTE — Telephone Encounter (Signed)
Jason [name],  Patient is returning a missed phone call. Please call!  [phone]

## 2016-04-14 NOTE — Telephone Encounter (Signed)
zoloft refilled 04/01/16 Alprazolam last refilled 02/2016 Needs ov for further refills. L/m for patient with this information

## 2016-04-16 ENCOUNTER — Other Ambulatory Visit: Payer: Self-pay | Admitting: Emergency Medicine

## 2016-04-18 ENCOUNTER — Ambulatory Visit: Payer: Self-pay

## 2016-04-18 ENCOUNTER — Telehealth: Payer: Self-pay | Admitting: Family Medicine

## 2016-04-18 DIAGNOSIS — F329 Major depressive disorder, single episode, unspecified: Secondary | ICD-10-CM

## 2016-04-18 DIAGNOSIS — F419 Anxiety disorder, unspecified: Principal | ICD-10-CM

## 2016-04-18 DIAGNOSIS — F32A Depression, unspecified: Secondary | ICD-10-CM

## 2016-04-18 MED ORDER — SERTRALINE HCL 50 MG PO TABS: ORAL_TABLET | ORAL | 0 refills | Status: DC

## 2016-04-18 MED ORDER — ALPRAZOLAM 0.5 MG PO TABS: ORAL_TABLET | ORAL | 0 refills | Status: DC

## 2016-04-18 NOTE — Telephone Encounter (Signed)
Last seen 09/2015 (has a high outstanding balance he cant pay)  Patient is requesting   Alprazolam and zoloft   Has very bad panic attacks and cant be without his medicine. "ive always worked with Dr Everlene Farrier when I couldn't get in to see MD due to money"  Trying to get on medicaid now.  Please advise next step.

## 2016-04-18 NOTE — Telephone Encounter (Signed)
I have given him some - please give him the number at [facility] for payment setup to see if that will help him

## 2016-04-18 NOTE — Telephone Encounter (Signed)
Jason [name] has a $490 balance that he's been trying to pay and he can't be seen until its paid he need his medicine refilled he has had 4 heart attacks in the pass and he states that he really need his medicine and someone keep telling him he needs an OV please respond

## 2016-04-21 NOTE — Telephone Encounter (Signed)
Message left for pt that prescription was faxed to pharmacy.

## 2016-05-16 ENCOUNTER — Other Ambulatory Visit: Payer: Self-pay | Admitting: Cardiology

## 2016-05-19 NOTE — Telephone Encounter (Signed)
Rx(s) sent to pharmacy electronically.  

## 2016-07-03 ENCOUNTER — Other Ambulatory Visit: Payer: Self-pay | Admitting: Cardiovascular Disease

## 2016-07-22 ENCOUNTER — Encounter: Payer: Self-pay | Admitting: *Deleted

## 2016-07-22 ENCOUNTER — Ambulatory Visit: Payer: Self-pay | Admitting: Cardiovascular Disease

## 2016-07-23 ENCOUNTER — Telehealth: Payer: Self-pay | Admitting: *Deleted

## 2016-07-23 MED ORDER — METOPROLOL SUCCINATE ER 100 MG PO TB24: 100.0000 mg | ORAL_TABLET | Freq: Every day | ORAL | 6 refills | Status: DC

## 2016-07-23 MED ORDER — CLOPIDOGREL BISULFATE 75 MG PO TABS: 75.0000 mg | ORAL_TABLET | Freq: Every day | ORAL | 6 refills | Status: DC

## 2016-07-23 MED ORDER — RANOLAZINE ER 1000 MG PO TB12: 1000.0000 mg | ORAL_TABLET | Freq: Two times a day (BID) | ORAL | 6 refills | Status: DC

## 2016-07-23 MED ORDER — NITROGLYCERIN 0.4 MG SL SUBL: SUBLINGUAL_TABLET | SUBLINGUAL | 4 refills | Status: DC

## 2016-07-23 MED ORDER — ISOSORBIDE MONONITRATE ER 60 MG PO TB24: ORAL_TABLET | ORAL | 6 refills | Status: DC

## 2016-07-23 NOTE — Telephone Encounter (Signed)
PATIENT NEEDED REFILL ON SEVERAL REFILLS CALLED INT PHARMACY.  E-SENT TO [facility] AT [address] 30 DAY SUPPLY WITH REFILLS PER PATIENT REQUEST.

## 2016-08-25 ENCOUNTER — Encounter: Payer: Self-pay | Admitting: Cardiovascular Disease

## 2016-08-25 ENCOUNTER — Ambulatory Visit (INDEPENDENT_AMBULATORY_CARE_PROVIDER_SITE_OTHER): Payer: Self-pay | Admitting: Cardiovascular Disease

## 2016-08-25 VITALS — BP 124/72 | HR 76 | Ht 69.0 in | Wt 165.0 lb

## 2016-08-25 DIAGNOSIS — I2510 Atherosclerotic heart disease of native coronary artery without angina pectoris: Secondary | ICD-10-CM

## 2016-08-25 DIAGNOSIS — E785 Hyperlipidemia, unspecified: Secondary | ICD-10-CM

## 2016-08-25 DIAGNOSIS — I208 Other forms of angina pectoris: Secondary | ICD-10-CM

## 2016-08-25 DIAGNOSIS — I10 Essential (primary) hypertension: Secondary | ICD-10-CM

## 2016-08-25 DIAGNOSIS — E1159 Type 2 diabetes mellitus with other circulatory complications: Secondary | ICD-10-CM

## 2016-08-25 DIAGNOSIS — Z794 Long term (current) use of insulin: Secondary | ICD-10-CM

## 2016-08-25 DIAGNOSIS — E039 Hypothyroidism, unspecified: Secondary | ICD-10-CM

## 2016-08-25 DIAGNOSIS — I6523 Occlusion and stenosis of bilateral carotid arteries: Secondary | ICD-10-CM

## 2016-08-25 DIAGNOSIS — E782 Mixed hyperlipidemia: Secondary | ICD-10-CM

## 2016-08-25 DIAGNOSIS — I4510 Unspecified right bundle-branch block: Secondary | ICD-10-CM

## 2016-08-25 DIAGNOSIS — I2089 Other forms of angina pectoris: Secondary | ICD-10-CM

## 2016-08-25 DIAGNOSIS — I2583 Coronary atherosclerosis due to lipid rich plaque: Secondary | ICD-10-CM

## 2016-08-25 MED ORDER — METOPROLOL SUCCINATE ER 100 MG PO TB24: ORAL_TABLET | ORAL | 6 refills | Status: DC

## 2016-08-25 NOTE — Patient Instructions (Addendum)
Medication Instructions:  The metoprolol succ has been increased to 125 mg daily ( 1 & 1/4 tablet)   Labwork:  TODAY slips provided to you.  Testing/Procedures: Your physician has requested that you have a carotid duplex. This test is an ultrasound of the carotid arteries in your neck. It looks at blood flow through these arteries that supply the brain with blood. Allow one hour for this exam. There are no restrictions or special instructions. THIS STUDY WILL BE DONE AT THE [facility].  Your physician has requested that you have an echocardiogram. Echocardiography is a painless test that uses sound waves to create images of your heart. It provides your doctor with information about the size and shape of your heart and how well your heart's chambers and valves are working. This procedure takes approximately one hour. There are no restrictions for this procedure. THIS TEST WILL BE DONE AT THE [facility] [facility]ICE.     Follow-Up: Your physician wants you to follow-up in: 6 months or sooner if needed. You will receive a reminder letter in the mail two months in advance. If you don't receive a letter, please call our office to schedule the follow-up appointment.    Any Other Special Instructions Will Be Listed Below (If Applicable).  Dr Claiborne Billings recommends that you follow up with your dermatologist to evaluate nose lesion.

## 2016-08-25 NOTE — Progress Notes (Signed)
Patient ID: Jason [name], male   DOB: [birth year], 66 y.o.   MRN: [id number]    Primary M.D.: Dr. Arlyss [name]  HPI: Jason [name] is a 66 y.o. male who presents to the office today for an 8 month follow up cardiology evaluation.  Jason [name] has a history of CAD and in 2001 underwent CABG revascularization surgery.  He has a history of hypertension, diabetes mellitus, COPD, and carotid disease.  In January 2015 he underwent cardiac catheterization which revealed a patent LIMA graft to his LAD, a patent sequential graft to marginal vessels, and an occluded sequential graft which previously had supplied the PDA and PLA vessels of the RCA and there was evidence for mild left-to-right collateralization.  In July 2015.  He was admitted with recurrent anginal symptomatology.  Repeat catheterization revealed mild LV dysfunction with mid to basal inferior hypocontractility.  Ejection fraction was 50%.  He had significant native CAD with coronary calcification, 70% ostial and distal left main stenoses, 50% stenosis in the first diagonal branch was LAD, 80% mid LAD stenosis, and occluded high marginal/ramus intermediate vessel, 80% AV groove left circumflex stenosis and total proximal RCA occlusion.  He is a patent LIMA to the LAD, a patent sequential graft to a high marginal, a known 2 vessel in the vein graft to its previously supplied the PDA and PLA vessel remained occluded.  Increased medical therapy was recommended.  When I saw him in August 2016 he had begun to notice more episodes of chest pain.  He had been on Ranexa 500 mg twice a day, isosorbide 30 mg, and aspirin. I titrated his Ranexa to 1000 mg bid and addedd low dose metoprolol. He had a loud carotid bruit and I referred him for carotid duplex imaging. He was [name] to have an occluded L ICA and high grade R ICA stenosis.  I referred him to Dr. Gwenlyn [name] and due to his high risk for carotid endartectomy he underwent successful right internal carotid  artery PTA and stenting using a nitinol self expanding stent and distal carotid embolic protection in the setting of an occluded contralateral carotid artery and high risk coronary anatomy. He tolerated the procedure well.   Jason [name] underwent a 2-D echo Doppler study on 03/31/2015.  This showed an EF of 60-65%.  There was mild LVH with basal inferoposterior hypokinesis to akinesis and grade 1 diastolic dysfunction.  He denies chest pain, PND, neurologic sx.  His pulse has been faster. He is still smoking, although considerably less than previously.   Since I last saw him, he has experienced episodes of some mild chest pain with fast walking.  He has been under some increased stress.  His mother died in June 08, 2023 and last week very close friend past away. Unfortunately he continues to smoke one pack of cigarettes per day.  In November, he states he was complaining of knee discomfort and stopped his atorvastatin, which previously had been 40 mg daily.  Most recently he has been taking ranolazine 1000 mg twice a day, Toprol-XL 100 mg daily, isosorbide 90 mg in addition to his aspirin and Plavix.  He continues to take levothyroxine at 50 g.  He has a history of type 2 diabetes mellitus and is on metformin.  He presents for evaluation.  Past Medical History:  Diagnosis Date  . Anxiety    off xanax  and paxil since 3/13  . Arthritis   . Bilateral carotid artery disease (HCC)    s/p R ICA  stent 03/28/2015 with distal protection. Known chronically occluded L ICA. Carotid stent complicated by hypotension and acute stroke in watershed territory  . Cancer (HCC)    tumor basal cell rem from lft arm  . Cataract   . COPD (chronic obstructive pulmonary disease) (HCC)   . Coronary artery disease    CABG 2/11, cath x 4 since - med Rx  . CVA (cerebral infarction)    occured on 10/10 several days after R ICA carotid stenting  . Depression   . Diabetes mellitus   . GERD (gastroesophageal reflux disease)   .  Heart attack   . High cholesterol   . Hypertension    type 2 NIDDM  . Myocardial infarct    x4 last 10 yrs  . Pneumonia    hx  . 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 Smoker   . Substance abuse     Past Surgical History:  Procedure Laterality Date  . BACK SURGERY  Jan 2014, Dec 2011   Dr Venetia Maxon  . CARDIAC CATHETERIZATION  4/12   Medical Rx  . CORONARY ANGIOGRAM  01/17/14   med rx  . CORONARY ANGIOGRAM  4/13   med Rx  . CORONARY ANGIOGRAM  1/15   Med Rx  . CORONARY ANGIOPLASTY  Jan 2004   RCA  . CORONARY ARTERY BYPASS GRAFT  07/24/2009   L-LAD, SVG-RI/OM, SVG-PDA  . EYE SURGERY     cat bil  . FEMORAL ARTERY - FEMORAL ARTERY BYPASS GRAFT Right 2004   femoral enarterectomy  . LEFT HEART CATHETERIZATION WITH CORONARY ANGIOGRAM N/A 10/03/2011   Procedure: LEFT HEART CATHETERIZATION WITH CORONARY ANGIOGRAM;  Surgeon: Jason Nose, MD;  Location: [facility] CATH LAB;  Service: Cardiovascular;  Laterality: N/A;  . LEFT HEART CATHETERIZATION WITH CORONARY ANGIOGRAM N/A 07/08/2013   Procedure: LEFT HEART CATHETERIZATION WITH CORONARY ANGIOGRAM;  Surgeon: Jason Chapman, MD;  Location: [facility] CATH LAB;  Service: Cardiovascular;  Laterality: N/A;  . LEFT HEART CATHETERIZATION WITH CORONARY/GRAFT ANGIOGRAM N/A 01/17/2014   Procedure: LEFT HEART CATHETERIZATION WITH Jason [name];  Surgeon: Jason Bihari, MD;  Location: [facility] CATH LAB;  Service: Cardiovascular;  Laterality: N/A;  . PERIPHERAL VASCULAR CATHETERIZATION N/A 03/28/2015   Procedure: Carotid PTA/Stent Intervention;  Surgeon: Jason Gess, MD;  Location: [facility] INVASIVE CV LAB;  Service: Cardiovascular;  Laterality: N/A;  . US EXTREMITY*L*     lft arm tumor removed     Allergies  Allergen Reactions  . Coreg [Carvedilol] Nausea And Vomiting and Other (See Comments)    Per patient made him dizzy and light sensitive  . Sulfa Antibiotics Nausea And Vomiting and Other (See Comments)    Also  headaches     Current Outpatient Prescriptions  Medication Sig Dispense Refill  . acetaminophen (TYLENOL) 325 MG tablet Take 2 tablets (650 mg total) by mouth every 4 (four) hours as needed for headache or mild pain.    Marland Kitchen ALPRAZolam (XANAX) 0.5 MG tablet TAKE 1/2 - 1 TABLET BY MOUTH 2 TIMES DAILY AS NEEDED FOR ANXIETY OR PANIC EPISODE 30 tablet 0  . aspirin 325 MG tablet Take 325 mg by mouth daily.    . clopidogrel (PLAVIX) 75 MG tablet Take 1 tablet (75 mg total) by mouth daily. 30 tablet 6  . Cyanocobalamin (VITAMIN B-12) 2000 MCG TBCR Take 1 tablet by mouth daily.    . isosorbide mononitrate (IMDUR) 60 MG 24 hr tablet TAKE 1.5 TABLETS BY MOUTH DAILY.  45 tablet 6  . levothyroxine (SYNTHROID, LEVOTHROID) 50 MCG tablet Take 1 tablet (50 mcg total) by mouth daily. 30 tablet 11  . loratadine (CLARITIN) 10 MG tablet Take 10 mg by mouth daily.    . metFORMIN (GLUCOPHAGE) 500 MG tablet TAKE 1 TABLET BY MOUTH TWICE A DAY WITH A MEAL 180 tablet 1  . metoprolol succinate (TOPROL XL) 100 MG 24 hr tablet Take 1 & 1/4 Tablet daily ('125mg'$ ) 60 tablet 6  . naproxen sodium (ANAPROX) 220 MG tablet Take 440 mg by mouth 2 (two) times daily as needed (for pain).    . nitroGLYCERIN (NITROSTAT) 0.4 MG SL tablet PLACE 1 TABLET (0.4 MG TOTAL) UNDER THE TONGUE EVERY 5 (FIVE) MINUTES AS NEEDED FOR CHEST PAIN. 25 tablet 4  . ranitidine (ZANTAC) 150 MG tablet Take 150 mg by mouth daily as needed for heartburn. For acid reflux    . ranolazine (RANEXA) 1000 MG SR tablet Take 1 tablet (1,000 mg total) by mouth 2 (two) times daily. 60 tablet 6  . sertraline (ZOLOFT) 50 MG tablet TAKE 1 TABLET BY MOUTH EVERY DAY (MUST HAVE OFFICE VISIT FOR FURTHER REFILLS) 30 tablet 0   No current facility-administered medications for this visit.     Social History   Social History  . Marital status: Single    Spouse name: N/A  . Number of children: 1  . Years of education: 28   Occupational History  . welder-fabricator     Social History Main Topics  . Smoking status: Current Every Day Smoker    Packs/day: 1.00    Years: 40.00    Types: Cigarettes  . Smokeless tobacco: Never Used  . Alcohol use 0.0 oz/week     Comment: socially  . Drug use: No     Comment: hx of cocaine use  . Sexual activity: Not Currently   Other Topics Concern  . Not on file   Social History Narrative   Lives alone   Caffeine use: Drinks pepsi/coffee (32 oz diet pepsi per day)    Exercise walking 4 times per week for 1 mile    Family History  Problem Relation Age of Onset  . Arthritis Mother   . Heart disease Father     ROS General: Negative; No fevers, chills, or night sweats HEENT: Negative; No changes in vision or hearing, sinus congestion, difficulty swallowing Pulmonary: Negative; No cough, wheezing, shortness of breath, hemoptysis Cardiovascular: See HPI:  GI: Negative; No nausea, vomiting, diarrhea, or abdominal pain GU: Negative; No dysuria, hematuria, or difficulty voiding Musculoskeletal: Occasional muscle cramps. Hematologic: Negative; no easy bruising, bleeding Endocrine: Negative; no heat/cold intolerance; no diabetes, Neuro: Negative; no changes in balance, headaches Skin: Negative; No rashes or skin lesions Psychiatric: Negative; No behavioral problems, depression Sleep: Negative; No snoring,  daytime sleepiness, hypersomnolence, bruxism, restless legs, hypnogognic hallucinations. Other comprehensive 14 point system review is negative   Physical Exam BP 124/72   Pulse 76   Ht '5\' 9"'$  (1.753 m)   Wt 165 lb (74.8 kg)   BMI 24.37 kg/m    Repeat blood pressure by me was 130/68  Wt Readings from Last 3 Encounters:  08/25/16 165 lb (74.8 kg)  01/04/16 166 lb 3.2 oz (75.4 kg)  10/11/15 162 lb 6.4 oz (73.7 kg)   General: Alert, oriented, no distress.  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 HEENT: Normocephalic, atraumatic. Pupils equal  round and reactive to light; sclera  anicteric; extraocular muscles intact, No lid lag; [name] without nasal septal hypertrophy; Mouth/Parynx benign; Mallinpatti scale 3 Neck: No JVD, resolution of prior loud right carotid bruit; normal carotid upstroke Lungs: clear to ausculatation and percussion bilaterally; no wheezing or rales, normal inspiratory and expiratory effort Chest wall: without tenderness to palpitation Heart: PMI not displaced, RRR, s1 s2 normal, 1/6 systolic murmur, No diastolic murmur, no rubs, gallops, thrills, or heaves Abdomen: soft, nontender; no hepatosplenomehaly, BS+; abdominal aorta nontender and not dilated by palpation. Back: no CVA tenderness Pulses: 2+ , soft femoral bruits; scar the right groin region from prior surgery Musculoskeletal: full range of motion, normal strength, no joint deformities Extremities: Pulses 2+, no clubbing cyanosis or edema, Homan's sign negative  Neurologic: grossly nonfocal; Cranial nerves grossly wnl Psychologic: Normal mood and affect  ECG (independently read by me): Normal sinus rhythm at 76 bpm.  Right bundle-branch block with repolarization changes.  Possible left posterior hemiblock.  Small inferior Q waves  July 2017 ECG (independently read by me): Sinus rhythm at 80 bpm with an occasional PVC.  Right bundle branch block.  Q wave in lead 3.  November 2016 ECG (independently read by me): NSR at 95; RBBB with repolariztion changes  August 2016 ECG (independently read by me): Normal sinus rhythm at 78 bpm.  Right bundle branch block with repolarization changes.  LABS:  BMP Latest Ref Rng & Units 07/12/2015 04/03/2015 04/01/2015  Glucose 65 - 99 mg/dL 173(H) 122(H) 142(H)  BUN 7 - 25 mg/dL '11 16 13  '$ Creatinine 0.70 - 1.25 mg/dL 1.14 1.52(H) 1.33(H)  Sodium 135 - 146 mmol/L 131(L) 132(L) 130(L)  Potassium 3.5 - 5.3 mmol/L 4.9 4.4 3.9  Chloride 98 - 110 mmol/L 100 96(L) 96(L)  CO2 20 - 31 mmol/L '21 26 25  '$ Calcium 8.6 - 10.3 mg/dL 9.9  9.7 9.3     Hepatic Function Latest Ref Rng & Units 03/28/2015 10/03/2011 07/24/2009  Total Protein 6.5 - 8.1 g/dL 6.2(L) 5.8(L) 5.6(L)  Albumin 3.5 - 5.0 g/dL 3.5 3.2(L) 3.2(L)  AST 15 - 41 U/L '18 15 18  '$ ALT 17 - 63 U/L '19 19 22  '$ Alk Phosphatase 38 - 126 U/L 69 101 74  Total Bilirubin 0.3 - 1.2 mg/dL 0.5 0.1(L) 0.8    CBC Latest Ref Rng & Units 09/14/2015 03/29/2015 03/28/2015  WBC 4.6 - 10.2 K/uL 12.5(A) 12.4(H) 11.1(H)  Hemoglobin 14.1 - 18.1 g/dL 14.0(A) 13.6 13.3  Hematocrit 43.5 - 53.7 % 38.7(A) 40.0 39.0  Platelets 150 - 400 K/uL - 229 221   Lab Results  Component Value Date   MCV 96.1 09/14/2015   MCV 97.1 03/29/2015   MCV 97.3 03/28/2015    Lab Results  Component Value Date   TSH 4.670 (H) 08/06/2015    BNP No results [name] for: BNP  ProBNP    Component Value Date/Time   PROBNP 77.9 07/07/2013 2033     Lipid Panel     Component Value Date/Time   CHOL 136 04/04/2015 0645   TRIG 148 04/04/2015 0645   HDL 34 (L) 04/04/2015 0645   CHOLHDL 4.0 04/04/2015 0645   VLDL 30 04/04/2015 0645   LDLCALC 72 04/04/2015 0645     RADIOLOGY: No results [name].   IMPRESSION:  1. Essential hypertension   2. Coronary artery disease due to lipid rich plaque   3. Type 2 diabetes mellitus with other circulatory complication, with long-term current use of insulin ([place])   4. Hypothyroidism, unspecified type   5.  Mixed hyperlipidemia   6. Bilateral carotid artery stenosis   7. RBBB   8. Other forms of angina pectoris ([place])   9. Hyperlipidemia with target LDL less than 70   10. Carotid stenosis, bilateral      ASSESSMENT AND PLAN: Jason [name] is a 66 year old male who underwent CABG revascularization surgery in 2001.  He has documented occlusion of the vein graft which previously had supplied the PDA and PLA branches of his RCA. At his last cardiac catheterization he has significant native CAD and continued to have a patent LIMA graft as well as a patent  sequential graft supplying a ramus intermediate, like vessel and OM 2 vessel.   He underwent very complex peripheral intervention to a high-grade ostial right internal carotid artery stenosis in the setting of having an occluded left internal carotid artery.  Over the past 8 months, he has experienced some episodes of chest tightness, particularly if he walks fast.  He also admits to an episode chest pain when he had some symptoms that were like the flu, but he did not have any fever.  His resting pulse is in the upper 70s.  I have recommended slight additional titration of Toprol-XL from 100 mg daily to 125 mg daily.  He continues to be on Ranexa 1000 g twice a day as well as isosorbide 90 mg.  I discussed with him the absolute importance of smoking cessation.  Unfortunately, he continues to smoke one pack of cigarettes per day.  He is fasting today.  I will obtain a complete set of fasting laboratory.  He had stopped taking atorvastatin force hyperlipidemia due to some knee discomfort.  I suspect statin therapy will need to be re-instituted but he may require a low-dose with Zetia for tolerability and he may be a candidate for PCSK9 inhibition.  I am rechecking a 2-D echo Doppler study and will also obtain follow-up carotid duplex imaging.  I will contact him regarding his laboratory.  I will see him in 6 months for reevaluation.  Time spent: 25 minutes  Troy Sine, MD, [facility]  08/25/2016 8:54 AM

## 2016-08-28 ENCOUNTER — Other Ambulatory Visit: Payer: Self-pay | Admitting: Emergency Medicine

## 2016-08-29 ENCOUNTER — Other Ambulatory Visit: Payer: Self-pay | Admitting: Cardiovascular Disease

## 2016-08-30 ENCOUNTER — Other Ambulatory Visit: Payer: Self-pay | Admitting: Emergency Medicine

## 2016-09-01 ENCOUNTER — Telehealth: Payer: Self-pay | Admitting: General Practice

## 2016-09-01 NOTE — Telephone Encounter (Signed)
[facility] is calling about the refill request of pt diabetic medications he is completely out   Best number [phone]

## 2016-09-02 MED ORDER — METFORMIN HCL 500 MG PO TABS: ORAL_TABLET | ORAL | 0 refills | Status: DC

## 2016-09-05 ENCOUNTER — Encounter: Payer: Self-pay | Admitting: Cardiovascular Disease

## 2016-09-08 ENCOUNTER — Ambulatory Visit (HOSPITAL_COMMUNITY)
Admission: RE | Admit: 2016-09-08 | Discharge: 2016-09-08 | Disposition: A | Discharge status: Home / Self Care | Payer: Self-pay | Source: Ambulatory Visit | Attending: Cardiovascular Disease | Admitting: Cardiovascular Disease

## 2016-09-08 DIAGNOSIS — I6523 Occlusion and stenosis of bilateral carotid arteries: Secondary | ICD-10-CM

## 2016-09-12 ENCOUNTER — Ambulatory Visit (HOSPITAL_COMMUNITY): Payer: Self-pay | Attending: Cardiology

## 2016-09-12 ENCOUNTER — Other Ambulatory Visit: Payer: Self-pay

## 2016-09-12 DIAGNOSIS — I517 Cardiomegaly: Secondary | ICD-10-CM | POA: Insufficient documentation

## 2016-09-12 DIAGNOSIS — I2510 Atherosclerotic heart disease of native coronary artery without angina pectoris: Secondary | ICD-10-CM | POA: Insufficient documentation

## 2016-09-12 DIAGNOSIS — I348 Other nonrheumatic mitral valve disorders: Secondary | ICD-10-CM | POA: Insufficient documentation

## 2016-09-12 DIAGNOSIS — I10 Essential (primary) hypertension: Secondary | ICD-10-CM | POA: Insufficient documentation

## 2016-09-12 DIAGNOSIS — I2583 Coronary atherosclerosis due to lipid rich plaque: Secondary | ICD-10-CM | POA: Insufficient documentation

## 2016-09-12 DIAGNOSIS — I350 Nonrheumatic aortic (valve) stenosis: Secondary | ICD-10-CM | POA: Insufficient documentation

## 2016-09-12 MED ORDER — PERFLUTREN LIPID MICROSPHERE
1.0000 mL | INTRAVENOUS | Status: AC | PRN
  Administered 2016-09-12: 3 mL via INTRAVENOUS

## 2016-09-29 ENCOUNTER — Telehealth: Payer: Self-pay | Admitting: *Deleted

## 2016-09-29 NOTE — Telephone Encounter (Addendum)
-----   Message from Troy Sine, MD sent at 09/24/2016  8:41 AM EDT ----- Normal LV function; grade 1 diastolic dysfunction, aortic valve cusp calcifications.  No mention was made of aortic stenosis.  Notes recorded by Troy Sine, MD on 09/29/2016 at 8:01 AM EDT 50-75% stenosis in the right ICA s/p stent, which has progressed from prior study.  Known occlusion of the left ICA. >50% stenosis in the bilateral ECA. Normal subclavian arteries, bilaterally. Arrange for follow-up PV evaluation with [name]  Left message for pt to call

## 2016-09-29 NOTE — Telephone Encounter (Signed)
Follow up ° ° ° ° ° °Returning a call to the nurse to get test results ° ° ° °

## 2016-09-29 NOTE — Telephone Encounter (Signed)
Several attempts to reach patient, he does not pick up.

## 2016-09-30 NOTE — Telephone Encounter (Signed)
Patient walked into the office, results discussed and follow up scheduled with dr berry.

## 2016-10-06 ENCOUNTER — Encounter: Payer: Self-pay | Admitting: Cardiology

## 2016-10-10 ENCOUNTER — Encounter: Payer: Self-pay | Admitting: Cardiovascular Disease

## 2016-10-10 ENCOUNTER — Ambulatory Visit (INDEPENDENT_AMBULATORY_CARE_PROVIDER_SITE_OTHER): Payer: Self-pay | Admitting: Cardiovascular Disease

## 2016-10-10 VITALS — BP 150/90 | HR 85 | Ht 69.0 in

## 2016-10-10 DIAGNOSIS — I6523 Occlusion and stenosis of bilateral carotid arteries: Secondary | ICD-10-CM

## 2016-10-10 NOTE — Progress Notes (Signed)
10/10/2016 Jason [name]   [birth year]  [id number]  Primary Physician [name] Patient Primary Cardiologist: Lorretta Harp MD [name]  HPI:  Jason [name] is a 66 I last saw him in the office 05/23/15.-year-old thin appearing single Caucasian male father of 64 year old daughter, grandfather to 55 year old granddaughter who was referred to me by [name] for peripheral vascular evaluation because of high-grade asymptomatic bilateral carotid artery stenosis. I last saw him in the office 05/23/15. He has a history of ischemic heart disease status post coronary artery bypass grafting in 2011. He had multiple stents prior to that. His other problems include history of continued tobacco abuse, diabetes, hypertension and hyperlipidemia. He has had continued chest pain requiring titration of his anti-anginal medications with a known occluded sequential vein graft to the PDA and PLA. His carotid Dopplers performed on 02/28/15 revealed an occluded left internal carotid artery and high-grade right ICA stenosis. The patient is high risk for carotid endarterectomy due to his anatomy and cardiac issues including poorly vascularized and inferior wall. Because of this it was elected to proceed with angiography and potential percutaneous intervention with carotid artery stenting. I performed this procedure 03/28/15 using distal protection with an excellent angiographic result. He did have refractory postintervention hypotension and was on pressor agents for several days. He ultimately developed a left parietal watershed infarct but now has no significant residual neurologic deficits. He remains on antiplatelet therapy. He had recent carotid Dopplers performed in our office 09/08/16 revealing in-stent restenosis within his right carotid stent.   Current Outpatient Prescriptions  Medication Sig Dispense Refill  . acetaminophen (TYLENOL) 325 MG tablet Take 2 tablets (650 mg total) by mouth every 4  (four) hours as needed for headache or mild pain.    Marland Kitchen ALPRAZolam (XANAX) 0.5 MG tablet TAKE 1/2 - 1 TABLET BY MOUTH 2 TIMES DAILY AS NEEDED FOR ANXIETY OR PANIC EPISODE 30 tablet 0  . aspirin 325 MG tablet Take 325 mg by mouth daily.    . clopidogrel (PLAVIX) 75 MG tablet Take 1 tablet (75 mg total) by mouth daily. 30 tablet 6  . Cyanocobalamin (VITAMIN B-12) 2000 MCG TBCR Take 1 tablet by mouth daily.    . isosorbide mononitrate (IMDUR) 60 MG 24 hr tablet TAKE 1.5 TABLETS BY MOUTH DAILY. 45 tablet 6  . levothyroxine (SYNTHROID, LEVOTHROID) 50 MCG tablet TAKE 1 TABLET BY MOUTH EVERY DAY 30 tablet 0  . loratadine (CLARITIN) 10 MG tablet Take 10 mg by mouth daily.    . metFORMIN (GLUCOPHAGE) 500 MG tablet TAKE 1 TABLET BY MOUTH TWICE A DAY WITH A MEAL 60 tablet 0  . metoprolol succinate (TOPROL-XL) 100 MG 24 hr tablet TAKE 1 TABLET BY MOUTH EVERY DAY TAKE WITH OR IMMEDIATELY FOLLOWING A MEAL 30 tablet 6  . naproxen sodium (ANAPROX) 220 MG tablet Take 440 mg by mouth 2 (two) times daily as needed (for pain).    . nitroGLYCERIN (NITROSTAT) 0.4 MG SL tablet PLACE 1 TABLET (0.4 MG TOTAL) UNDER THE TONGUE EVERY 5 (FIVE) MINUTES AS NEEDED FOR CHEST PAIN. 25 tablet 4  . ranitidine (ZANTAC) 150 MG tablet Take 150 mg by mouth daily as needed for heartburn. For acid reflux    . ranolazine (RANEXA) 1000 MG SR tablet Take 1 tablet (1,000 mg total) by mouth 2 (two) times daily. 60 tablet 6  . sertraline (ZOLOFT) 50 MG tablet TAKE 1 TABLET BY MOUTH EVERY DAY (MUST HAVE OFFICE VISIT FOR  FURTHER REFILLS) 30 tablet 0   No current facility-administered medications for this visit.     Allergies  Allergen Reactions  . Coreg [Carvedilol] Nausea And Vomiting and Other (See Comments)    Per patient made him dizzy and light sensitive  . Sulfa Antibiotics Nausea And Vomiting and Other (See Comments)    Also headaches     Social History   Social History  . Marital status: Single    Spouse name: N/A  . Number  of children: 1  . Years of education: 32   Occupational History  . welder-fabricator    Social History Main Topics  . Smoking status: Current Every Day Smoker    Packs/day: 1.00    Years: 40.00    Types: Cigarettes  . Smokeless tobacco: Never Used  . Alcohol use 0.0 oz/week     Comment: socially  . Drug use: No     Comment: hx of cocaine use  . Sexual activity: Not Currently   Other Topics Concern  . Not on file   Social History Narrative   Lives alone   Caffeine use: Drinks pepsi/coffee (32 oz diet pepsi per day)    Exercise walking 4 times per week for 1 mile     Review of Systems: General: negative for chills, fever, night sweats or weight changes.  Cardiovascular: negative for chest pain, dyspnea on exertion, edema, orthopnea, palpitations, paroxysmal nocturnal dyspnea or shortness of breath Dermatological: negative for rash Respiratory: negative for cough or wheezing Urologic: negative for hematuria Abdominal: negative for nausea, vomiting, diarrhea, bright red blood per rectum, melena, or hematemesis Neurologic: negative for visual changes, syncope, or dizziness All other systems reviewed and are otherwise negative except as noted above.    Blood pressure (!) 150/90, pulse 85, height 5\' 9"  (1.753 m).  General appearance: alert and no distress Neck: no adenopathy, no carotid bruit, no JVD, supple, symmetrical, trachea midline and thyroid not enlarged, symmetric, no tenderness/mass/nodules Lungs: clear to auscultation bilaterally Heart: regular rate and rhythm, S1, S2 normal, no murmur, click, rub or gallop Extremities: extremities normal, atraumatic, no cyanosis or edema  EKG not performed today  ASSESSMENT AND PLAN:   Carotid stenosis- moderate 2011, 95% 2016 s/p stent History of carotid artery disease with a known occluded left internal carotid artery status post right ICA stenting using distal protection back in 2016. We have been getting routine Doppler  studies. His most recent carotid Doppler are  Suggestive of in-stent restenosis in the moderate range. He remains on aspirin and Plavix and is neurologically asymptomatic. He does continue to smoke and apparently has stopped his statin drug. At this point, I do not think this stenosis is severe enough to perform any intervention. We will continue to follow him noninvasively every 6 months.      Lorretta Harp MD FACP,FACC,FAHA, [facility] 10/10/2016 3:40 PM

## 2016-10-10 NOTE — Patient Instructions (Signed)
Medication Instructions: Your physician recommends that you continue on your current medications as directed. Please refer to the Current Medication list given to you today.  Testing/Procedures: Your physician has requested that you have a carotid duplex in 6 months, prior to follow-up appt with [name]. This test is an ultrasound of the carotid arteries in your neck. It looks at blood flow through these arteries that supply the brain with blood. Allow one hour for this exam. There are no restrictions or special instructions.  Follow-Up: Your physician wants you to follow-up in: 6 months with [name]. You will receive a reminder letter in the mail two months in advance. If you don't receive a letter, please call our office to schedule the follow-up appointment.  If you need a refill on your cardiac medications before your next appointment, please call your pharmacy.

## 2016-10-10 NOTE — Assessment & Plan Note (Signed)
History of carotid artery disease with a known occluded left internal carotid artery status post right ICA stenting using distal protection back in 2016. We have been getting routine Doppler studies. His most recent carotid Doppler are  Suggestive of in-stent restenosis in the moderate range. He remains on aspirin and Plavix and is neurologically asymptomatic. He does continue to smoke and apparently has stopped his statin drug. At this point, I do not think this stenosis is severe enough to perform any intervention. We will continue to follow him noninvasively every 6 months.

## 2016-10-22 ENCOUNTER — Inpatient Hospital Stay (HOSPITAL_COMMUNITY)
Admission: EM | Admit: 2016-10-22 | Discharge: 2016-10-26 | DRG: 246 | Disposition: A | Discharge status: Home / Self Care | Payer: Self-pay | Attending: Cardiovascular Disease | Admitting: Cardiovascular Disease

## 2016-10-22 ENCOUNTER — Ambulatory Visit (INDEPENDENT_AMBULATORY_CARE_PROVIDER_SITE_OTHER): Payer: Self-pay | Admitting: Urgent Care

## 2016-10-22 ENCOUNTER — Encounter: Payer: Self-pay | Admitting: Urgent Care

## 2016-10-22 ENCOUNTER — Encounter (HOSPITAL_COMMUNITY): Payer: Self-pay | Admitting: Emergency Medicine

## 2016-10-22 ENCOUNTER — Ambulatory Visit (INDEPENDENT_AMBULATORY_CARE_PROVIDER_SITE_OTHER): Payer: Self-pay

## 2016-10-22 ENCOUNTER — Emergency Department (HOSPITAL_COMMUNITY): Payer: Self-pay

## 2016-10-22 VITALS — BP 144/80 | HR 84 | Temp 97.5°F | Resp 17 | Ht 69.0 in | Wt 169.0 lb

## 2016-10-22 DIAGNOSIS — R059 Cough, unspecified: Secondary | ICD-10-CM

## 2016-10-22 DIAGNOSIS — E1151 Type 2 diabetes mellitus with diabetic peripheral angiopathy without gangrene: Secondary | ICD-10-CM | POA: Diagnosis present

## 2016-10-22 DIAGNOSIS — Z8673 Personal history of transient ischemic attack (TIA), and cerebral infarction without residual deficits: Secondary | ICD-10-CM

## 2016-10-22 DIAGNOSIS — Z7902 Long term (current) use of antithrombotics/antiplatelets: Secondary | ICD-10-CM

## 2016-10-22 DIAGNOSIS — I5043 Acute on chronic combined systolic (congestive) and diastolic (congestive) heart failure: Secondary | ICD-10-CM | POA: Diagnosis present

## 2016-10-22 DIAGNOSIS — F410 Panic disorder [episodic paroxysmal anxiety] without agoraphobia: Secondary | ICD-10-CM | POA: Diagnosis present

## 2016-10-22 DIAGNOSIS — M1990 Unspecified osteoarthritis, unspecified site: Secondary | ICD-10-CM | POA: Diagnosis present

## 2016-10-22 DIAGNOSIS — I10 Essential (primary) hypertension: Secondary | ICD-10-CM

## 2016-10-22 DIAGNOSIS — I6521 Occlusion and stenosis of right carotid artery: Secondary | ICD-10-CM

## 2016-10-22 DIAGNOSIS — Z720 Tobacco use: Secondary | ICD-10-CM | POA: Diagnosis present

## 2016-10-22 DIAGNOSIS — Z8249 Family history of ischemic heart disease and other diseases of the circulatory system: Secondary | ICD-10-CM

## 2016-10-22 DIAGNOSIS — R05 Cough: Secondary | ICD-10-CM

## 2016-10-22 DIAGNOSIS — E785 Hyperlipidemia, unspecified: Secondary | ICD-10-CM | POA: Diagnosis present

## 2016-10-22 DIAGNOSIS — R0789 Other chest pain: Secondary | ICD-10-CM

## 2016-10-22 DIAGNOSIS — F329 Major depressive disorder, single episode, unspecified: Secondary | ICD-10-CM | POA: Diagnosis present

## 2016-10-22 DIAGNOSIS — E119 Type 2 diabetes mellitus without complications: Secondary | ICD-10-CM

## 2016-10-22 DIAGNOSIS — Z882 Allergy status to sulfonamides status: Secondary | ICD-10-CM

## 2016-10-22 DIAGNOSIS — F17210 Nicotine dependence, cigarettes, uncomplicated: Secondary | ICD-10-CM | POA: Diagnosis present

## 2016-10-22 DIAGNOSIS — I739 Peripheral vascular disease, unspecified: Secondary | ICD-10-CM | POA: Diagnosis present

## 2016-10-22 DIAGNOSIS — J449 Chronic obstructive pulmonary disease, unspecified: Secondary | ICD-10-CM | POA: Diagnosis present

## 2016-10-22 DIAGNOSIS — K219 Gastro-esophageal reflux disease without esophagitis: Secondary | ICD-10-CM | POA: Diagnosis present

## 2016-10-22 DIAGNOSIS — E039 Hypothyroidism, unspecified: Secondary | ICD-10-CM | POA: Diagnosis present

## 2016-10-22 DIAGNOSIS — I130 Hypertensive heart and chronic kidney disease with heart failure and stage 1 through stage 4 chronic kidney disease, or unspecified chronic kidney disease: Secondary | ICD-10-CM | POA: Diagnosis present

## 2016-10-22 DIAGNOSIS — I25119 Atherosclerotic heart disease of native coronary artery with unspecified angina pectoris: Secondary | ICD-10-CM | POA: Diagnosis present

## 2016-10-22 DIAGNOSIS — I214 Non-ST elevation (NSTEMI) myocardial infarction: Principal | ICD-10-CM | POA: Diagnosis present

## 2016-10-22 DIAGNOSIS — I252 Old myocardial infarction: Secondary | ICD-10-CM

## 2016-10-22 DIAGNOSIS — Z888 Allergy status to other drugs, medicaments and biological substances status: Secondary | ICD-10-CM

## 2016-10-22 DIAGNOSIS — I452 Bifascicular block: Secondary | ICD-10-CM | POA: Diagnosis present

## 2016-10-22 DIAGNOSIS — I2510 Atherosclerotic heart disease of native coronary artery without angina pectoris: Secondary | ICD-10-CM | POA: Diagnosis present

## 2016-10-22 DIAGNOSIS — Z7982 Long term (current) use of aspirin: Secondary | ICD-10-CM

## 2016-10-22 DIAGNOSIS — I6523 Occlusion and stenosis of bilateral carotid arteries: Secondary | ICD-10-CM

## 2016-10-22 DIAGNOSIS — Z79899 Other long term (current) drug therapy: Secondary | ICD-10-CM

## 2016-10-22 DIAGNOSIS — Z85828 Personal history of other malignant neoplasm of skin: Secondary | ICD-10-CM

## 2016-10-22 DIAGNOSIS — I5031 Acute diastolic (congestive) heart failure: Secondary | ICD-10-CM | POA: Diagnosis present

## 2016-10-22 DIAGNOSIS — N189 Chronic kidney disease, unspecified: Secondary | ICD-10-CM | POA: Diagnosis present

## 2016-10-22 DIAGNOSIS — E1122 Type 2 diabetes mellitus with diabetic chronic kidney disease: Secondary | ICD-10-CM | POA: Diagnosis present

## 2016-10-22 DIAGNOSIS — Z7984 Long term (current) use of oral hypoglycemic drugs: Secondary | ICD-10-CM

## 2016-10-22 DIAGNOSIS — I25110 Atherosclerotic heart disease of native coronary artery with unstable angina pectoris: Secondary | ICD-10-CM | POA: Diagnosis present

## 2016-10-22 DIAGNOSIS — I2583 Coronary atherosclerosis due to lipid rich plaque: Secondary | ICD-10-CM

## 2016-10-22 DIAGNOSIS — Z955 Presence of coronary angioplasty implant and graft: Secondary | ICD-10-CM

## 2016-10-22 LAB — I-STAT TROPONIN, ED: Troponin i, poc: 0.45 ng/mL (ref 0.00–0.08)

## 2016-10-22 LAB — CBC
HEMATOCRIT: 36.9 % — AB (ref 39.0–52.0)
HEMOGLOBIN: 12.9 g/dL — AB (ref 13.0–17.0)
MCH: 33.5 pg (ref 26.0–34.0)
MCHC: 35 g/dL (ref 30.0–36.0)
MCV: 95.8 fL (ref 78.0–100.0)
Platelets: 210 10*3/uL (ref 150–400)
RBC: 3.85 MIL/uL — AB (ref 4.22–5.81)
RDW: 12.7 % (ref 11.5–15.5)
WBC: 12.1 10*3/uL — ABNORMAL HIGH (ref 4.0–10.5)

## 2016-10-22 LAB — BASIC METABOLIC PANEL
ANION GAP: 8 (ref 5–15)
BUN: 8 mg/dL (ref 6–20)
CO2: 21 mmol/L — AB (ref 22–32)
Calcium: 9.4 mg/dL (ref 8.9–10.3)
Chloride: 98 mmol/L — ABNORMAL LOW (ref 101–111)
Creatinine, Ser: 1.25 mg/dL — ABNORMAL HIGH (ref 0.61–1.24)
GFR calc non Af Amer: 59 mL/min — ABNORMAL LOW (ref >60)
GLUCOSE: 135 mg/dL — AB (ref 65–99)
POTASSIUM: 3.7 mmol/L (ref 3.5–5.1)
Sodium: 127 mmol/L — ABNORMAL LOW (ref 135–145)

## 2016-10-22 LAB — POCT CBC
Granulocyte percent: 62.6 %G (ref 37–80)
HEMATOCRIT: 40.2 % — AB (ref 43.5–53.7)
HEMOGLOBIN: 14 g/dL — AB (ref 14.1–18.1)
LYMPH, POC: 3.5 — AB (ref 0.6–3.4)
MCH, POC: 33.9 pg — AB (ref 27–31.2)
MCHC: 34.7 g/dL (ref 31.8–35.4)
MCV: 97.7 fL — AB (ref 80–97)
MID (cbc): 0.7 (ref 0–0.9)
MPV: 6 fL (ref 0–99.8)
PLATELET COUNT, POC: 268 10*3/uL (ref 142–424)
POC GRANULOCYTE: 6.9 (ref 2–6.9)
POC LYMPH %: 31.4 % (ref 10–50)
POC MID %: 6 %M (ref 0–12)
RBC: 4.12 M/uL — AB (ref 4.69–6.13)
RDW, POC: 12.8 %
WBC: 11 10*3/uL — AB (ref 4.6–10.2)

## 2016-10-22 LAB — BRAIN NATRIURETIC PEPTIDE: B NATRIURETIC PEPTIDE 5: 272.8 pg/mL — AB (ref 0.0–100.0)

## 2016-10-22 LAB — TROPONIN I: TROPONIN I: 1.21 ng/mL — AB (ref <0.03)

## 2016-10-22 MED ORDER — HEPARIN BOLUS VIA INFUSION
4000.0000 [IU] | Freq: Once | INTRAVENOUS | Status: AC
  Administered 2016-10-22: 4000 [IU] via INTRAVENOUS
  Filled 2016-10-22: qty 4000

## 2016-10-22 MED ORDER — SERTRALINE HCL 50 MG PO TABS
50.0000 mg | ORAL_TABLET | Freq: Every day | ORAL | Status: DC
  Administered 2016-10-23 – 2016-10-26 (×4): 50 mg via ORAL
  Filled 2016-10-22 (×5): qty 1

## 2016-10-22 MED ORDER — ASPIRIN 81 MG PO CHEW
81.0000 mg | CHEWABLE_TABLET | ORAL | Status: AC
  Administered 2016-10-23: 81 mg via ORAL
  Filled 2016-10-22: qty 1

## 2016-10-22 MED ORDER — LORATADINE 10 MG PO TABS
10.0000 mg | ORAL_TABLET | Freq: Every day | ORAL | Status: DC
  Administered 2016-10-23 – 2016-10-26 (×4): 10 mg via ORAL
  Filled 2016-10-22 (×5): qty 1

## 2016-10-22 MED ORDER — CLOPIDOGREL BISULFATE 75 MG PO TABS
75.0000 mg | ORAL_TABLET | Freq: Every day | ORAL | Status: DC
  Administered 2016-10-23 – 2016-10-26 (×4): 75 mg via ORAL
  Filled 2016-10-22 (×4): qty 1

## 2016-10-22 MED ORDER — INSULIN ASPART 100 UNIT/ML ~~LOC~~ SOLN
0.0000 [IU] | Freq: Three times a day (TID) | SUBCUTANEOUS | Status: DC
  Administered 2016-10-23 – 2016-10-24 (×2): 3 [IU] via SUBCUTANEOUS
  Administered 2016-10-24 – 2016-10-25 (×3): 2 [IU] via SUBCUTANEOUS
  Administered 2016-10-25 – 2016-10-26 (×2): 3 [IU] via SUBCUTANEOUS
  Administered 2016-10-26: 2 [IU] via SUBCUTANEOUS

## 2016-10-22 MED ORDER — SODIUM CHLORIDE 0.9% FLUSH
3.0000 mL | Freq: Two times a day (BID) | INTRAVENOUS | Status: DC
  Administered 2016-10-22 – 2016-10-25 (×5): 3 mL via INTRAVENOUS

## 2016-10-22 MED ORDER — HEPARIN (PORCINE) IN NACL 100-0.45 UNIT/ML-% IJ SOLN
1150.0000 [IU]/h | INTRAMUSCULAR | Status: DC
  Administered 2016-10-22: 950 [IU]/h via INTRAVENOUS
  Filled 2016-10-22: qty 250

## 2016-10-22 MED ORDER — ONDANSETRON HCL 4 MG/2ML IJ SOLN
4.0000 mg | Freq: Four times a day (QID) | INTRAMUSCULAR | Status: DC | PRN
  Administered 2016-10-24 – 2016-10-25 (×3): 4 mg via INTRAVENOUS
  Filled 2016-10-22 (×3): qty 2

## 2016-10-22 MED ORDER — LEVOTHYROXINE SODIUM 50 MCG PO TABS
50.0000 ug | ORAL_TABLET | Freq: Every day | ORAL | Status: DC
  Administered 2016-10-23 – 2016-10-26 (×4): 50 ug via ORAL
  Filled 2016-10-22 (×4): qty 1

## 2016-10-22 MED ORDER — NITROGLYCERIN 0.3 MG SL SUBL: 0.4000 mg | SUBLINGUAL_TABLET | SUBLINGUAL | Status: DC | PRN

## 2016-10-22 MED ORDER — SODIUM CHLORIDE 0.9 % IV SOLN
250.0000 mL | INTRAVENOUS | Status: DC | PRN
  Administered 2016-10-23: 250 mL via INTRAVENOUS

## 2016-10-22 MED ORDER — ATORVASTATIN CALCIUM 40 MG PO TABS
40.0000 mg | ORAL_TABLET | Freq: Every day | ORAL | Status: DC
  Administered 2016-10-23: 40 mg via ORAL
  Filled 2016-10-22: qty 1

## 2016-10-22 MED ORDER — METOPROLOL SUCCINATE ER 100 MG PO TB24
100.0000 mg | ORAL_TABLET | Freq: Every day | ORAL | Status: DC
  Administered 2016-10-23 – 2016-10-26 (×4): 100 mg via ORAL
  Filled 2016-10-22 (×5): qty 1

## 2016-10-22 MED ORDER — ALPRAZOLAM 0.25 MG PO TABS
0.2500 mg | ORAL_TABLET | Freq: Three times a day (TID) | ORAL | Status: DC | PRN
  Administered 2016-10-22 – 2016-10-26 (×5): 0.25 mg via ORAL
  Filled 2016-10-22 (×5): qty 1

## 2016-10-22 MED ORDER — NITROGLYCERIN 0.4 MG SL SUBL: 0.4000 mg | SUBLINGUAL_TABLET | SUBLINGUAL | Status: DC | PRN

## 2016-10-22 MED ORDER — SODIUM CHLORIDE 0.9% FLUSH: 3.0000 mL | INTRAVENOUS | Status: DC | PRN

## 2016-10-22 MED ORDER — SODIUM CHLORIDE 0.9 % IV SOLN: INTRAVENOUS | Status: DC

## 2016-10-22 MED ORDER — ISOSORBIDE MONONITRATE ER 60 MG PO TB24
90.0000 mg | ORAL_TABLET | Freq: Every day | ORAL | Status: DC
  Administered 2016-10-23: 90 mg via ORAL
  Filled 2016-10-22: qty 2

## 2016-10-22 MED ORDER — ASPIRIN EC 81 MG PO TBEC
81.0000 mg | DELAYED_RELEASE_TABLET | Freq: Every day | ORAL | Status: DC
  Administered 2016-10-24 – 2016-10-26 (×3): 81 mg via ORAL
  Filled 2016-10-22 (×4): qty 1

## 2016-10-22 MED ORDER — ACETAMINOPHEN 325 MG PO TABS
650.0000 mg | ORAL_TABLET | ORAL | Status: DC | PRN
  Administered 2016-10-25: 650 mg via ORAL
  Filled 2016-10-22: qty 2

## 2016-10-22 MED ORDER — RANOLAZINE ER 500 MG PO TB12
1000.0000 mg | ORAL_TABLET | Freq: Two times a day (BID) | ORAL | Status: DC
  Administered 2016-10-22 – 2016-10-23 (×2): 1000 mg via ORAL
  Filled 2016-10-22 (×2): qty 2

## 2016-10-22 MED ORDER — ASPIRIN 81 MG PO CHEW
324.0000 mg | CHEWABLE_TABLET | Freq: Once | ORAL | Status: AC
  Administered 2016-10-22: 324 mg via ORAL

## 2016-10-22 MED ORDER — SODIUM CHLORIDE 0.9 % IV SOLN
INTRAVENOUS | Status: DC
  Administered 2016-10-22: 23:00:00 via INTRAVENOUS

## 2016-10-22 NOTE — ED Notes (Signed)
ED Provider at bedside. 

## 2016-10-22 NOTE — Progress Notes (Signed)
ANTICOAGULATION CONSULT NOTE - Initial Consult  Pharmacy Consult for heparin Indication: chest pain/ACS  Allergies  Allergen Reactions  . Coreg [Carvedilol] Nausea And Vomiting and Other (See Comments)    Per patient made him dizzy and light sensitive  . Sulfa Antibiotics Nausea And Vomiting and Other (See Comments)    Also headaches     Patient Measurements:   Heparin Dosing Weight: 76.7 kg  Vital Signs: Temp: 98 F (36.7 C) (05/02 1511) Temp Source: Oral (05/02 1511) BP: 126/72 (05/02 1700) Pulse Rate: 68 (05/02 1700)  Labs:  Recent Labs  10/22/16 1417 10/22/16 1509  HGB 14.0* 12.9*  HCT 40.2* 36.9*  PLT  --  210  CREATININE  --  1.25*    Estimated Creatinine Clearance: 59.7 mL/min (A) (by C-G formula based on SCr of 1.25 mg/dL (H)).   Medical History: Past Medical History:  Diagnosis Date  . Anxiety    off xanax  and paxil since 3/13  . Arthritis   . Bilateral carotid artery disease ([place])    s/p R ICA stent 03/28/2015 with distal protection. Known chronically occluded L ICA. Carotid stent complicated by hypotension and acute stroke [place]  . Cancer ([place])    tumor basal cell rem from lft arm  . Cataract   . COPD (chronic obstructive pulmonary disease) ([place])   . Coronary artery disease    CABG 2/11, cath x 4 since - med Rx  . CVA (cerebral infarction)    occured on 10/10 several days after R ICA carotid stenting  . Depression   . Diabetes mellitus   . GERD (gastroesophageal reflux disease)   . Heart attack ([place])   . High cholesterol   . Hypertension    type 2 NIDDM  . Myocardial infarct ([facility])    x4 last 10 yrs  . Pneumonia    hx  . 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 Smoker   . Substance abuse    Assessment: 66yo M presented 10/22/16 with chest pain. Pharmacy has been consulted to dose heparin. No PTA anticoagulation, Hgb 12.9, Plt wnl, no bleeding noted.  Goal of Therapy:  Heparin level  0.3-0.7 units/ml Monitor platelets by anticoagulation protocol: Yes   Plan:  Give 4000 units bolus x 1 Start heparin infusion at 950 units/hr Check anti-Xa level in 6 hours and daily while on heparin Continue to monitor H&H and platelets   Gwenlyn Perking, PharmD PGY1 Pharmacy Resident Pager: [phone] 10/22/2016 5:14 PM

## 2016-10-22 NOTE — ED Notes (Signed)
Informed Caitlynn - RN and Resident MD of pt's Troponin result.

## 2016-10-22 NOTE — ED Triage Notes (Signed)
Pt from [place] by EMS. Pt c/o left sided cp that radiates to left arm that has been on and off since January. Pt thought it was anxiety after multiple deaths in family. 324 ASA and 3 sublingual nitro given by EMS which decreased some pain.

## 2016-10-22 NOTE — ED Notes (Signed)
Pt eating dinner currently

## 2016-10-22 NOTE — Progress Notes (Addendum)
MRN: [id number] DOB: 1951/04/27  Subjective:   Jason [name] is a 66 y.o. male with pmh of MI, CAD, COPD presenting for chief complaint of Chest Pain  Reports 1 month intermittent history of chest pain. His last episode of chest pain occurred today between 10:00-13:30. Chest pain can radiate to his left chest and left arm. Chest pain is sharp in nature, lasts 2 hours at times, can occur twice daily and at random times during the day while he's sitting. The chest pain can be relieved with nitroglycerin. He took Imdur this morning, has not used SL nitroglycerin today. Has also had intermittently productive cough, shob, wheezing. He last saw [name], his cardiologist, on 10/10/2016 for carotid artery disease. Denies fever, n/v, abdominal pain.   Jason [name] has a current medication list which includes the following prescription(s): acetaminophen, alprazolam, aspirin, clopidogrel, vitamin b-12, isosorbide mononitrate, levothyroxine, loratadine, metformin, metoprolol succinate, naproxen sodium, nitroglycerin, ranitidine, ranolazine, and sertraline. Also is allergic to coreg [carvedilol] and sulfa antibiotics.  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 Also  has a past surgical history that includes Femoral artery - femoral artery bypass graft (Right, 2004); US EXTREMITY*L*; Cardiac catheterization (4/12); Coronary angioplasty (Jan 2004); Back surgery (Jan 2014, Dec 2011); Coronary artery bypass graft (07/24/2009); Eye surgery; Coronary angiogram (01/17/14); Coronary  angiogram (4/13); Coronary angiogram (1/15); left heart catheterization with coronary angiogram (N/A, 10/03/2011); left heart catheterization with coronary angiogram (N/A, 07/08/2013); left heart catheterization with coronary/graft angiogram (N/A, 01/17/2014); and Cardiac catheterization (N/A, 03/28/2015).  Objective:   Vitals: BP (!) 144/80   Pulse 84   Temp 97.5 F (36.4 C) (Oral)   Resp 17   Ht 5\' 9"  (1.753 m)   Wt 169 lb (76.7 kg)   SpO2 97%   BMI 24.96 kg/m   BP Readings from Last 3 Encounters:  10/22/16 (!) 144/80  10/10/16 (!) 150/90  08/25/16 124/72   Physical Exam  Constitutional: He is oriented to person, place, and time. He appears well-developed and well-nourished.  HENT:  Mouth/Throat: Oropharynx is clear and moist.  Eyes: No scleral icterus.  Neck: Normal range of motion. Neck supple.  Cardiovascular: Normal rate, regular rhythm and intact distal pulses.  Exam reveals no gallop and no friction rub.   No murmur heard. Pulmonary/Chest: No respiratory distress. He has no wheezes. He has no rales.  Abdominal: Soft. Bowel sounds are normal. He exhibits no distension and no mass. There is no tenderness. There is no guarding.  Lymphadenopathy:    He has no cervical adenopathy.  Neurological: He is alert and oriented to person, place, and time.  Skin: Skin is warm and dry.  Psychiatric: He has a normal mood and affect.   Results for orders placed or performed in visit on 10/22/16 (from the past 24 hour(s))  POCT CBC     Status: Abnormal   Collection Time: 10/22/16  2:17 PM  Result Value Ref Range   WBC 11.0 (A) 4.6 - 10.2 K/uL   Lymph, poc 3.5 (A) 0.6 - 3.4   POC LYMPH PERCENT 31.4 10 - 50 %L   MID (cbc) 0.7 0 - 0.9   POC MID % 6.0 0 - 12 %M  POC Granulocyte 6.9 2 - 6.9   Granulocyte percent 62.6 37 - 80 %G   RBC 4.12 (A) 4.69 - 6.13 M/uL   Hemoglobin 14.0 (A) 14.1 - 18.1 g/dL   HCT, POC 40.2 (A) 43.5 - 53.7 %   MCV 97.7 (A) 80 - 97 fL   MCH, POC 33.9 (A) 27 -  31.2 pg   MCHC 34.7 31.8 - 35.4 g/dL   RDW, POC 12.8 %   Platelet Count, POC 268 142 - 424 K/uL   MPV 6.0 0 - 99.8 fL   Dg Chest 2 View  Result Date: 10/22/2016 CLINICAL DATA:  Chest pain, cough. EXAM: CHEST  2 VIEW COMPARISON:  Radiographs of September 14, 2015. FINDINGS: The heart size and mediastinal contours are within normal limits. Both lungs are clear. Status post coronary artery bypass graft. No pneumothorax or pleural effusion is noted. The visualized skeletal structures are unremarkable. Atherosclerosis of thoracic aorta is noted. IMPRESSION: No active cardiopulmonary disease. Electronically Signed   By: Marijo Conception, M.D.   On: 10/22/2016 14:03   ECG interpretation - ST depression in leads V4-V6 which is a change from his previous ecg on 08/25/2016.   Assessment and Plan :   Case precepted with Dr. Esmond Camper.  1. Atypical chest pain 2. Cough 3. Chest tightness 4. History of MI (myocardial infarction) 5. Coronary artery disease involving native heart with angina pectoris, unspecified vessel or lesion type ([place]) 6. Coronary artery disease due to lipid rich plaque 7. Bilateral carotid artery stenosis 8. Stenosis of right carotid artery 9. Essential hypertension - Will send out via EMS to [facility] for emergency management of ACS. Patient given 0.4mg  SL nitro, 324mg  chewable aspirin in clinic. No oxygen administered due to oxygen saturation of 97%. Case reported out to Camera operator at [facility].  Jaynee Eagles, PA-C [facility] [phone] 10/22/2016  1:39 PM

## 2016-10-22 NOTE — ED Provider Notes (Signed)
[facility] DEPT Provider Note   CSN: [id number] Arrival date & time: 10/22/16  1502     History   Chief Complaint Chief Complaint  Patient presents with  . Chest Pain    HPI Jason [name] is a 66 y.o. male.  The history is provided by the patient and medical records.  Chest Pain   This is a recurrent problem. Episode onset: Possibly January. Episode frequency: intermittently. Progression since onset: Worse last night than it has been. The pain is associated with rest, an emotional upset and exertion. The pain is present in the substernal region. The pain is severe. The quality of the pain is described as sharp and burning. The pain radiates to the left shoulder and left arm. The symptoms are aggravated by exertion. Pertinent negatives include no abdominal pain, no back pain, no cough, no diaphoresis, no dizziness, no fever, no lower extremity edema, no near-syncope, no palpitations, no shortness of breath and no vomiting. He has tried rest for the symptoms. The treatment provided no relief. Risk factors include male gender, smoking/tobacco exposure and stress.  His past medical history is significant for anxiety/panic attacks, CAD, COPD, diabetes, hyperlipidemia, hypertension, MI and strokes.  Pertinent negatives for past medical history include no seizures.  Procedure history is positive for cardiac catheterization and echocardiogram.    Past Medical History:  Diagnosis Date  . Anxiety    off xanax  and paxil since 3/13  . Arthritis   . Bilateral carotid artery disease ([place])    s/p R ICA stent 03/28/2015 with distal protection. Known chronically occluded L ICA. Carotid stent complicated by hypotension and acute [place]  . Cancer ([place])    tumor basal cell rem from lft arm  . Cataract   . COPD (chronic obstructive pulmonary disease) ([place])   . Coronary artery disease    CABG 2/11, cath x 4 since - med Rx  . CVA (cerebral infarction)    occured on  10/10 several days after R ICA carotid stenting  . Depression   . Diabetes mellitus   . GERD (gastroesophageal reflux disease)   . Heart attack ([place])   . High cholesterol   . Hypertension    type 2 NIDDM  . Myocardial infarct ([facility])    x4 last 10 yrs  . Pneumonia    hx  . 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 Smoker   . Substance abuse     Patient Active Problem List   Diagnosis Date Noted  . Hypothyroidism 08/17/2015  . RBBB 05/11/2015  . Numbness of hand   . Hyponatremia 04/04/2015  . Hypotension 04/04/2015  . Hemispheric carotid artery syndrome   . Carotid artery narrowing 03/28/2015  . Carotid stenosis- moderate 2011, 95% 2016 s/p stent 02/21/2014  . Unstable angina ([place]) 01/16/2014  . Tobacco abuse 01/16/2014  . Chest pain 07/08/2013  . Substance abuse in remission 10/01/2012  . Radiculitis 02/10/2012  . CAD, CABG Feb 2011, cath x 4 since-medical Rx 10/03/2011  . PVD, hx Rt femoral endarterectomy 2004 10/03/2011  . DM (diabetes mellitus) ([place]) 10/02/2011  . HTN (hypertension) 10/02/2011  . Hyperlipidemia 10/02/2011    Past Surgical History:  Procedure Laterality Date  . BACK SURGERY  Jan 2014, Dec 2011   Dr Vertell Limber  . CARDIAC CATHETERIZATION  4/12   Medical Rx  . CORONARY ANGIOGRAM  01/17/14   med rx  . CORONARY ANGIOGRAM  4/13   med Rx  .  CORONARY ANGIOGRAM  1/15   Med Rx  . CORONARY ANGIOPLASTY  Jan 2004   RCA  . CORONARY ARTERY BYPASS GRAFT  07/24/2009   L-LAD, SVG-RI/OM, SVG-PDA  . EYE SURGERY     cat bil  . FEMORAL ARTERY - FEMORAL ARTERY BYPASS GRAFT Right 2004   femoral enarterectomy  . LEFT HEART CATHETERIZATION WITH CORONARY ANGIOGRAM N/A 10/03/2011   Procedure: LEFT HEART CATHETERIZATION WITH CORONARY ANGIOGRAM;  Surgeon: Pixie Casino, MD;  Location[facility] CATH LAB;  Service: Cardiovascular;  Laterality: N/A;  . LEFT HEART CATHETERIZATION WITH CORONARY ANGIOGRAM N/A 07/08/2013   Procedure: LEFT HEART  CATHETERIZATION WITH CORONARY ANGIOGRAM;  Surgeon: Blane Ohara, MD;  Location: [facility] CATH LAB;  Service: Cardiovascular;  Laterality: N/A;  . LEFT HEART CATHETERIZATION WITH CORONARY/GRAFT ANGIOGRAM N/A 01/17/2014   Procedure: LEFT HEART CATHETERIZATION WITH Beatrix Fetters;  Surgeon: Troy Sine, MD;  Location: [facility] CATH LAB;  Service: Cardiovascular;  Laterality: N/A;  . PERIPHERAL VASCULAR CATHETERIZATION N/A 03/28/2015   Procedure: Carotid PTA/Stent Intervention;  Surgeon: Lorretta Harp, MD;  Location: [facility] CV LAB;  Service: Cardiovascular;  Laterality: N/A;  . US EXTREMITY*L*     lft arm tumor removed        Home Medications    Prior to Admission medications   Medication Sig Start Date End Date Taking? Authorizing Provider  acetaminophen (TYLENOL) 325 MG tablet Take 2 tablets (650 mg total) by mouth every 4 (four) hours as needed for headache or mild pain. 01/18/14   Erlene Quan, PA-C  ALPRAZolam Duanne Moron) 0.5 MG tablet TAKE 1/2 - 1 TABLET BY MOUTH 2 TIMES DAILY AS NEEDED FOR ANXIETY OR PANIC EPISODE 04/18/16   Mancel Bale, PA-C  aspirin 325 MG tablet Take 325 mg by mouth daily.    Historical Provider, MD  clopidogrel (PLAVIX) 75 MG tablet Take 1 tablet (75 mg total) by mouth daily. 07/23/16   Troy Sine, MD  Cyanocobalamin (VITAMIN B-12) 2000 MCG TBCR Take 1 tablet by mouth daily.    Historical Provider, MD  isosorbide mononitrate (IMDUR) 60 MG 24 hr tablet TAKE 1.5 TABLETS BY MOUTH DAILY. 07/23/16   Troy Sine, MD  levothyroxine (SYNTHROID, LEVOTHROID) 50 MCG tablet TAKE 1 TABLET BY MOUTH EVERY DAY 08/31/16   Tereasa Coop, PA-C  loratadine (CLARITIN) 10 MG tablet Take 10 mg by mouth daily.    Historical Provider, MD  metFORMIN (GLUCOPHAGE) 500 MG tablet TAKE 1 TABLET BY MOUTH TWICE A DAY WITH A MEAL 09/02/16   Tereasa Coop, PA-C  metoprolol succinate (TOPROL-XL) 100 MG 24 hr tablet TAKE 1 TABLET BY MOUTH EVERY DAY TAKE WITH OR IMMEDIATELY FOLLOWING A  MEAL 08/29/16   Troy Sine, MD  naproxen sodium (ANAPROX) 220 MG tablet Take 440 mg by mouth 2 (two) times daily as needed (for pain).    Historical Provider, MD  nitroGLYCERIN (NITROSTAT) 0.4 MG SL tablet PLACE 1 TABLET (0.4 MG TOTAL) UNDER THE TONGUE EVERY 5 (FIVE) MINUTES AS NEEDED FOR CHEST PAIN. 07/23/16   Troy Sine, MD  ranitidine (ZANTAC) 150 MG tablet Take 150 mg by mouth daily as needed for heartburn. For acid reflux    Historical Provider, MD  ranolazine (RANEXA) 1000 MG SR tablet Take 1 tablet (1,000 mg total) by mouth 2 (two) times daily. 07/23/16   Troy Sine, MD  sertraline (ZOLOFT) 50 MG tablet TAKE 1 TABLET BY MOUTH EVERY DAY (MUST HAVE OFFICE VISIT FOR FURTHER REFILLS)  04/18/16   Mancel Bale, PA-C    Family History Family History  Problem Relation Age of Onset  . Arthritis Mother   . Heart disease Father     Social History Social History  Substance Use Topics  . Smoking status: Current Every Day Smoker    Packs/day: 1.00    Years: 48.00    Types: Cigarettes  . Smokeless tobacco: Never Used  . Alcohol use 0.0 oz/week     Comment: socially     Allergies   Coreg [carvedilol] and Sulfa antibiotics   Review of Systems Review of Systems  Constitutional: Negative for chills, diaphoresis and fever.  HENT: Negative for ear pain and sore throat.   Eyes: Negative for pain and visual disturbance.  Respiratory: Negative for cough and shortness of breath.   Cardiovascular: Positive for chest pain. Negative for palpitations and near-syncope.  Gastrointestinal: Negative for abdominal pain and vomiting.  Genitourinary: Negative for dysuria and hematuria.  Musculoskeletal: Negative for arthralgias and back pain.  Skin: Negative for color change and rash.  Neurological: Negative for dizziness, seizures and syncope.  All other systems reviewed and are negative.    Physical Exam Updated Vital Signs BP 135/64 (BP Location: Right Arm)   Pulse 74   Temp 98 F  (36.7 C) (Oral)   Resp 19   SpO2 97%   Physical Exam  Constitutional: He appears well-developed and well-nourished.  HENT:  Head: Normocephalic and atraumatic.  Eyes: Conjunctivae are normal.  Neck: Neck supple.  Cardiovascular: Normal rate and regular rhythm.   No murmur heard. Pulmonary/Chest: Effort normal and breath sounds normal. No respiratory distress.  Abdominal: Soft. There is no tenderness.  Musculoskeletal: He exhibits no edema.  Neurological: He is alert.  Skin: Skin is warm and dry.  Psychiatric: He has a normal mood and affect.  Nursing note and vitals reviewed.    ED Treatments / Results  Labs (all labs ordered are listed, but only abnormal results are displayed) Labs Reviewed  BASIC METABOLIC PANEL - Abnormal; Notable for the following:       Result Value   Sodium 127 (*)    Chloride 98 (*)    CO2 21 (*)    Glucose, Bld 135 (*)    Creatinine, Ser 1.25 (*)    GFR calc non Af Amer 59 (*)    All other components within normal limits  CBC - Abnormal; Notable for the following:    WBC 12.1 (*)    RBC 3.85 (*)    Hemoglobin 12.9 (*)    HCT 36.9 (*)    All other components within normal limits  BRAIN NATRIURETIC PEPTIDE - Abnormal; Notable for the following:    B Natriuretic Peptide 272.8 (*)    All other components within normal limits  I-STAT TROPOININ, ED - Abnormal; Notable for the following:    Troponin i, poc 0.45 (*)    All other components within normal limits    EKG  EKG Interpretation  Date/Time:  Wednesday Oct 22 2016 15:08:26 EDT Ventricular Rate:  73 PR Interval:    QRS Duration: 159 QT Interval:  462 QTC Calculation: 510 R Axis:   129 Text Interpretation:  Sinus rhythm Ventricular premature complex RBBB and LPFB Inferior infarct, old ST depr, consider ischemia, anterolateral lds similar to previous Confirmed by LITTLE MD, RACHEL [phone]) on 10/22/2016 4:58:47 PM       Radiology Dg Chest 2 View  Result Date: 10/22/2016 CLINICAL  DATA:  Chest pain,  cough. EXAM: CHEST  2 VIEW COMPARISON:  Radiographs of September 14, 2015. FINDINGS: The heart size and mediastinal contours are within normal limits. Both lungs are clear. Status post coronary artery bypass graft. No pneumothorax or pleural effusion is noted. The visualized skeletal structures are unremarkable. Atherosclerosis of thoracic aorta is noted. IMPRESSION: No active cardiopulmonary disease. Electronically Signed   By: Marijo Conception, M.D.   On: 10/22/2016 14:03    Procedures Procedures (including critical care time)  Medications Ordered in ED Medications - No data to display   Initial Impression / Assessment and Plan / ED Course  I have reviewed the triage vital signs and the nursing notes.  Pertinent labs & imaging results that were available during my care of the patient were reviewed by me and considered in my medical decision making (see chart for details).    Pt with h/o CAD (s/p stents & CABG w/known occulusion of a graft vein on ASA & Plavix), HTN, HLD, carotid disease presents with CP. Says he's been having intermittent chest pain since January; initially assumed it was related to his anxiety based on the fact that he had two people he was close to recently passed, and he's been without Xanax since that time. Says anxiety related chest pain usually feels like tightness with some shortness of breath; he also describes occasional sharp/burning substernal chest pain that radiates to the left arm when he exerts himself. Last night he had a severe episode of the latter, and thought it felt similar to his prior MI, but it abated on it's own, so he didn't come to the hospital. Today he went to UC to get refill of his Xanax, and the EKG they did showed STD and V4-6 that were not present on his tracing from March; they gave him NTG and ASA and sent him to the ED by ambulance for evaluation. CP free at time of evaluation.  VS & exam as above. EKG: SR @ 73bpm w/bifascicular  block & more prominent STD in V3-6 when compared to tracing from 08/25/16. CXR without acute findings. POC troponin 0.45 w/o recent comparison. Heparin gtt started.  Cardiology consulted and evaluated the Pt in the ED; they will admit the Pt to their service for cath tomorrow.  Final Clinical Impressions(s) / ED Diagnoses   Final diagnoses:  NSTEMI (non-ST elevated myocardial infarction) [facility])    New Prescriptions New Prescriptions   No medications on file     Jenny Reichmann, MD 10/22/16 1838    Rex Kras Wenda Overland, MD 10/25/16 [phone]

## 2016-10-22 NOTE — H&P (Signed)
Patient ID: Jason [name] MRN: [id number] DOB/AGE: [birth year] 65 y.o.  Admit date: 10/22/2016 Referring Physician: EDP Primary Physician: no PCP Primary Cardiologist: [name] Reason for Consultation: chest pain  HPI:  This is a 66 y.o. man with PMHx of bilateral carotid artery stenosis s/p stenting (03/2015), ischemic heart disease s/p CABG (2001) with multiple stents prior, ongoing tobacco use of 1 PPD, DM, HTN, HLD, hypothyroidism presenting with chest pain.  He presented this AM to Urgent Care for medication refills and with complaint of chest pain.  Given sublingual NTG, ASA 325mg  and transferred to [facility] ED.  Patient reports initial onset of intermittent chest pain since January that he attributed to anxiety and grief over loss of mother and close friend.  Over the past month, he began to feel that this was more likely "heart pain" but also reports sinus symptoms or congestion and having more reflux.  He describes the pain as dull at times and sharp at times.  There have been several nights over past 2-3 weeks where he felt like he needed to go to the hospital due to chest pain, but has been able to use his Ranexa and NTG to relieve chest pain.  During this time, he has also been reporting worse exertional capacity with CP and dyspnea on exertion that is relieved with rest.  This morning, he had an episode of CP for 30-45 minutes relieved with NTG.  Presently, his pain is relieved but does report a heavy feeling.  He has had nausea w/o vomiting, radiating pain to his left arm at times.  He denies issues with bleeding, lower extremity edema, orthopnea, or syncope.    He was most recently seen in the clinic by [name] with repeat carotid dopplers suggestive of in-stent restenosis in the moderate range with plan for non-invasive follow up every 6 months.  He has not been taking his atorvastatin.  He has been consistent with his ASA and plavix.  Additionally, he takes metoprolol succinate,  Ranexa, and Imdur.  Past Medical History:  Diagnosis Date  . Anxiety    off xanax  and paxil since 3/13  . Arthritis   . Bilateral carotid artery disease ([place])    s/p R ICA stent 03/28/2015 with distal protection. Known chronically occluded L ICA. Carotid stent complicated by hypotension and acute stroke in [place]  . Cancer ([place])    tumor basal cell rem from lft arm  . Cataract   . COPD (chronic obstructive pulmonary disease) ([place])   . Coronary artery disease    CABG 2/11, cath x 4 since - med Rx  . CVA (cerebral infarction)    occured on 10/10 several days after R ICA carotid stenting  . Depression   . Diabetes mellitus   . GERD (gastroesophageal reflux disease)   . Heart attack ([place])   . High cholesterol   . Hypertension    type 2 NIDDM  . Myocardial infarct ([facility])    x4 last 10 yrs  . Pneumonia    hx  . 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 Smoker   . Substance abuse     Family History  Problem Relation Age of Onset  . Arthritis Mother   . Heart disease Father     Social History   Social History  . Marital status: Single    Spouse name: N/A  . Number of children: 1  . Years of education: 29  Occupational History  . welder-fabricator    Social History Main Topics  . Smoking status: Current Every Day Smoker    Packs/day: 1.00    Years: 48.00    Types: Cigarettes  . Smokeless tobacco: Never Used  . Alcohol use 0.0 oz/week     Comment: socially  . Drug use: No     Comment: hx of cocaine use  . Sexual activity: Not Currently   Other Topics Concern  . Not on file   Social History Narrative   Lives alone   Caffeine use: Drinks pepsi/coffee (32 oz diet pepsi per day)    Exercise walking 4 times per week for 1 mile    Past Surgical History:  Procedure Laterality Date  . BACK SURGERY  Jan 2014, Dec 2011   Dr Vertell Limber  . CARDIAC CATHETERIZATION  4/12   Medical Rx  . CORONARY ANGIOGRAM  01/17/14   med rx  .  CORONARY ANGIOGRAM  4/13   med Rx  . CORONARY ANGIOGRAM  1/15   Med Rx  . CORONARY ANGIOPLASTY  Jan 2004   RCA  . CORONARY ARTERY BYPASS GRAFT  07/24/2009   L-LAD, SVG-RI/OM, SVG-PDA  . EYE SURGERY     cat bil  . FEMORAL ARTERY - FEMORAL ARTERY BYPASS GRAFT Right 2004   femoral enarterectomy  . LEFT HEART CATHETERIZATION WITH CORONARY ANGIOGRAM N/A 10/03/2011   Procedure: LEFT HEART CATHETERIZATION WITH CORONARY ANGIOGRAM;  Surgeon: Pixie Casino, MD;  Location: [facility] CATH LAB;  Service: Cardiovascular;  Laterality: N/A;  . LEFT HEART CATHETERIZATION WITH CORONARY ANGIOGRAM N/A 07/08/2013   Procedure: LEFT HEART CATHETERIZATION WITH CORONARY ANGIOGRAM;  Surgeon: Blane Ohara, MD;  Location: [facility] CATH LAB;  Service: Cardiovascular;  Laterality: N/A;  . LEFT HEART CATHETERIZATION WITH CORONARY/GRAFT ANGIOGRAM N/A 01/17/2014   Procedure: LEFT HEART CATHETERIZATION WITH Beatrix Fetters;  Surgeon: Troy Sine, MD;  Location: [facility] CATH LAB;  Service: Cardiovascular;  Laterality: N/A;  . PERIPHERAL VASCULAR CATHETERIZATION N/A 03/28/2015   Procedure: Carotid PTA/Stent Intervention;  Surgeon: Lorretta Harp, MD;  Location: [facility] CV LAB;  Service: Cardiovascular;  Laterality: N/A;  . US EXTREMITY*L*     lft arm tumor removed     Allergies  Allergen Reactions  . Coreg [Carvedilol] Nausea And Vomiting and Other (See Comments)    Per patient made him dizzy and light sensitive  . Sulfa Antibiotics Nausea And Vomiting and Other (See Comments)    Also headaches     Prior to Admission medications   Medication Sig Start Date End Date Taking? Authorizing Provider  acetaminophen (TYLENOL) 325 MG tablet Take 2 tablets (650 mg total) by mouth every 4 (four) hours as needed for headache or mild pain. 01/18/14   Erlene Quan, PA-C  ALPRAZolam Duanne Moron) 0.5 MG tablet TAKE 1/2 - 1 TABLET BY MOUTH 2 TIMES DAILY AS NEEDED FOR ANXIETY OR PANIC EPISODE 04/18/16   Mancel Bale, PA-C  aspirin 325  MG tablet Take 325 mg by mouth daily.    Historical Provider, MD  clopidogrel (PLAVIX) 75 MG tablet Take 1 tablet (75 mg total) by mouth daily. 07/23/16   Troy Sine, MD  Cyanocobalamin (VITAMIN B-12) 2000 MCG TBCR Take 1 tablet by mouth daily.    Historical Provider, MD  isosorbide mononitrate (IMDUR) 60 MG 24 hr tablet TAKE 1.5 TABLETS BY MOUTH DAILY. 07/23/16   Troy Sine, MD  levothyroxine (SYNTHROID, LEVOTHROID) 50 MCG tablet TAKE 1 TABLET BY MOUTH EVERY  DAY 08/31/16   Tereasa Coop, PA-C  loratadine (CLARITIN) 10 MG tablet Take 10 mg by mouth daily.    Historical Provider, MD  metFORMIN (GLUCOPHAGE) 500 MG tablet TAKE 1 TABLET BY MOUTH TWICE A DAY WITH A MEAL 09/02/16   Tereasa Coop, PA-C  metoprolol succinate (TOPROL-XL) 100 MG 24 hr tablet TAKE 1 TABLET BY MOUTH EVERY DAY TAKE WITH OR IMMEDIATELY FOLLOWING A MEAL 08/29/16   Troy Sine, MD  naproxen sodium (ANAPROX) 220 MG tablet Take 440 mg by mouth 2 (two) times daily as needed (for pain).    Historical Provider, MD  nitroGLYCERIN (NITROSTAT) 0.4 MG SL tablet PLACE 1 TABLET (0.4 MG TOTAL) UNDER THE TONGUE EVERY 5 (FIVE) MINUTES AS NEEDED FOR CHEST PAIN. 07/23/16   Troy Sine, MD  ranitidine (ZANTAC) 150 MG tablet Take 150 mg by mouth daily as needed for heartburn. For acid reflux    Historical Provider, MD  ranolazine (RANEXA) 1000 MG SR tablet Take 1 tablet (1,000 mg total) by mouth 2 (two) times daily. 07/23/16   Troy Sine, MD  sertraline (ZOLOFT) 50 MG tablet TAKE 1 TABLET BY MOUTH EVERY DAY (MUST HAVE OFFICE VISIT FOR FURTHER REFILLS) 04/18/16   Mancel Bale, PA-C    Review of systems complete and found to be negative unless listed above    Physical Exam: Blood pressure 126/72, pulse 68, temperature 98 F (36.7 C), temperature source Oral, resp. rate 16, SpO2 97 %.    General: alert, not in distress, sitting up, wearing [place] supplemental oxygen HEENT: [place]/AT, EOMI SKIN: warm, dry. No rashes.  Neuro: No focal  deficits  Musculoskeletal: no obvious deformity Psychiatric: Mood and affect normal  Neck: No JVD, no carotid bruits Lungs:Clear bilaterally, no wheezes, rhonchi, crackles  Cardiovascular: Regular rate and rhythm. No murmurs, gallops or rubs.  Abdomen:Soft. Bowel sounds present. Non-tender.  Extremities: No lower extremity edema. Warm and perfused   Labs:   Lab Results  Component Value Date   WBC 12.1 (H) 10/22/2016   HGB 12.9 (L) 10/22/2016   HCT 36.9 (L) 10/22/2016   MCV 95.8 10/22/2016   PLT 210 10/22/2016     Recent Labs Lab 10/22/16 1509  NA 127*  K 3.7  CL 98*  CO2 21*  BUN 8  CREATININE 1.25*  CALCIUM 9.4  GLUCOSE 135*   Troponin (Point of Care Test)  Recent Labs  10/22/16 1523  TROPIPOC 0.45*    Radiology: Chest X-ray: no acute cardiopulmonary process  EKG: Sinus rhythm, old RBBB, ST depression appear more prominent v3-v6  ASSESSMENT AND PLAN:  66 y.o. man with PMHx of bilateral carotid artery stenosis s/p stenting (03/2015), ischemic heart disease s/p CABG (2001) with multiple stents prior, ongoing tobacco use of 1 PPD, DM, HTN, HLD, hypothyroidism presenting with chest pain and found to have elevated troponin with new EKG changes.  # NSTEMI/Unstable Angina: multiple risk factors for ACS in patient with known CAD and ongoing tobacco abuse presenting with typical and atypical features of chest pain, but with new EKG changes and POC troponin elevation to 0.45.  Started on heparin gtt in the emergency department. - Heparin gtt - Continue BB, Imdur, and Ranexa - Restart atorvastatin 40mg  daily - ASA starting tomorrow - Risk factor modification: check lipid panel, Hgb A1c, smoking cessation - Cycle troponins - EKG in AM - Cath tomorrow  # HLD - Reinitiate statin as above  # DM: last Hgb A1c 6.9 in April 2017 - hold  metformin in anticipation of cath - CBG monitoring, can add sliding scale coverage if needed  # Hypothyroidism - Continue home  meds  # Chronic Renal Insufficiency: Creatinine is 1.2 which appears consistent with recent values. - Monitor with cath  Signed: Jule Ser 10/22/2016, 6:03 PM PGY-2, [facility]  I have personally seen and examined this patient with Dr. Juleen China. I agree with the assessment and plan as outlined above. Mr. Wesch has known CAD with prior CABG, PAD and ongoing tobacco abuse. He is now presenting with classic unstable angina. He has had chest pain on/off for several months. He is now having chest pain with minimal exertion. Troponin is elevated at 0.45. EKG with diffuse ST depression. No resting chest pain.  My exam shows:   General: Well developed, well nourished, NAD  HEENT: OP clear, mucus membranes moist  SKIN: warm, dry. No rashes. Neuro: No focal deficits  Musculoskeletal: Muscle strength 5/5 all ext  Psychiatric: Mood and affect normal  Neck: No JVD, no thyromegaly, no lymphadenopathy.  Lungs:Clear bilaterally, no wheezes, rhonci, crackles Cardiovascular: Regular rate and rhythm. No murmurs, gallops or rubs. Abdomen:Soft. Bowel sounds present. Non-tender.  Extremities: No lower extremity edema.   EKG reviewed personally by me and shows sinus rhythm, PVC, RBBB, diffuse ST depression, most prominent in the anterolateral leads Labs reviewed by me. Sodium of 127. Creat 1.25.   Plan: 1.NSTEMI: Will admit to telemetry unit. He is now on IV heparin. Will continue ASA, Plavix, beta blocker. Resume statin. NPO at midnight for cardiac cath.   Lauree Chandler 10/22/2016 6:12 PM

## 2016-10-22 NOTE — ED Notes (Signed)
Patient transported to X-ray 

## 2016-10-22 NOTE — Patient Instructions (Addendum)
Nonspecific Chest Pain Chest pain can be caused by many different conditions. There is always a chance that your pain could be related to something serious, such as a heart attack or a blood clot in your lungs. Chest pain can also be caused by conditions that are not life-threatening. If you have chest pain, it is very important to follow up with your health care provider. What are the causes? Causes of this condition include:  Heartburn.  Pneumonia or bronchitis.  Anxiety or stress.  Inflammation around your heart (pericarditis) or lung (pleuritis or pleurisy).  A blood clot in your lung.  A collapsed lung (pneumothorax). This can develop suddenly on its own (spontaneous pneumothorax) or from trauma to the chest.  Shingles infection (varicella-zoster virus).  Heart attack.  Damage to the bones, muscles, and cartilage that make up your chest wall. This can include: ? Bruised bones due to injury. ? Strained muscles or cartilage due to frequent or repeated coughing or overwork. ? Fracture to one or more ribs. ? Sore cartilage due to inflammation (costochondritis).  What increases the risk? Risk factors for this condition may include:  Activities that increase your risk for trauma or injury to your chest.  Respiratory infections or conditions that cause frequent coughing.  Medical conditions or overeating that can cause heartburn.  Heart disease or family history of heart disease.  Conditions or health behaviors that increase your risk of developing a blood clot.  Having had chicken pox (varicella zoster).  What are the signs or symptoms? Chest pain can feel like:  Burning or tingling on the surface of your chest or deep in your chest.  Crushing, pressure, aching, or squeezing pain.  Dull or sharp pain that is worse when you move, cough, or take a deep breath.  Pain that is also felt in your back, neck, shoulder, or arm, or pain that spreads to any of these  areas.  Your chest pain may come and go, or it may stay constant. How is this diagnosed? Lab tests or other studies may be needed to find the cause of your pain. Your health care provider may have you take a test called an ECG (electrocardiogram). An ECG records your heartbeat patterns at the time the test is performed. You may also have other tests, such as:  Transthoracic echocardiogram (TTE). In this test, sound waves are used to create a picture of the heart structures and to look at how blood flows through your heart.  Transesophageal echocardiogram (TEE).This is a more advanced imaging test that takes images from inside your body. It allows your health care provider to see your heart in finer detail.  Cardiac monitoring. This allows your health care provider to monitor your heart rate and rhythm in real time.  Holter monitor. This is a portable device that records your heartbeat and can help to diagnose abnormal heartbeats. It allows your health care provider to track your heart activity for several days, if needed.  Stress tests. These can be done through exercise or by taking medicine that makes your heart beat more quickly.  Blood tests.  Other imaging tests.  How is this treated? Treatment depends on what is causing your chest pain. Treatment may include:  Medicines. These may include: ? Acid blockers for heartburn. ? Anti-inflammatory medicine. ? Pain medicine for inflammatory conditions. ? Antibiotic medicine, if an infection is present. ? Medicines to dissolve blood clots. ? Medicines to treat coronary artery disease (CAD).  Supportive care for conditions that   do not require medicines. This may include: ? Resting. ? Applying heat or cold packs to injured areas. ? Limiting activities until pain decreases.  Follow these instructions at home: Medicines  If you were prescribed an antibiotic, take it as told by your health care provider. Do not stop taking the  antibiotic even if you start to feel better.  Take over-the-counter and prescription medicines only as told by your health care provider. Lifestyle  Do not use any products that contain nicotine or tobacco, such as cigarettes and e-cigarettes. If you need help quitting, ask your health care provider.  Do not drink alcohol.  Make lifestyle changes as directed by your health care provider. These may include: ? Getting regular exercise. Ask your health care provider to suggest some activities that are safe for you. ? Eating a heart-healthy diet. A registered dietitian can help you to learn healthy eating options. ? Maintaining a healthy weight. ? Managing diabetes, if necessary. ? Reducing stress, such as with yoga or relaxation techniques. General instructions  Avoid any activities that bring on chest pain.  If heartburn is the cause for your chest pain, raise (elevate) the head of your bed about 6 inches (15 cm) by putting blocks under the legs. Sleeping with more pillows does not effectively relieve heartburn because it only changes the position of your head.  Keep all follow-up visits as told by your health care provider. This is important. This includes any further testing if your chest pain does not go away. Contact a health care provider if:  Your chest pain does not go away.  You have a rash with blisters on your chest.  You have a fever.  You have chills. Get help right away if:  Your chest pain is worse.  You have a cough that gets worse, or you cough up blood.  You have severe pain in your abdomen.  You have severe weakness.  You faint.  You have sudden, unexplained chest discomfort.  You have sudden, unexplained discomfort in your arms, back, neck, or jaw.  You have shortness of breath at any time.  You suddenly start to sweat, or your skin gets clammy.  You feel nauseous or you vomit.  You suddenly feel light-headed or dizzy.  Your heart begins to beat  quickly, or it feels like it is skipping beats. These symptoms may represent a serious problem that is an emergency. Do not wait to see if the symptoms will go away. Get medical help right away. Call your local emergency services (911 in the [place] Do not drive yourself to the hospital. This information is not intended to replace advice given to you by your health care provider. Make sure you discuss any questions you have with your health care provider. Document Released: 03/19/2005 Document Revised: 03/03/2016 Document Reviewed: 03/03/2016 Elsevier Interactive Patient Education  2017 Elsevier Inc.     IF you received an x-ray today, you will receive an invoice from [facility]. Please contact [facility] at [phone] with questions or concerns regarding your invoice.   IF you received labwork today, you will receive an invoice from [facility]. Please contact [facility] at [phone] with questions or concerns regarding your invoice.   Our billing staff will not be able to assist you with questions regarding bills from these companies.  You will be contacted with the lab results as soon as they are available. The fastest way to get your results is to activate your My Chart account. Instructions are located   last page of this paperwork. If you have not heard from [place] regarding the results in 2 weeks, please contact this office.

## 2016-10-23 ENCOUNTER — Encounter (HOSPITAL_COMMUNITY): Payer: Self-pay | Admitting: Cardiology

## 2016-10-23 ENCOUNTER — Encounter (HOSPITAL_COMMUNITY)
Admission: EM | Disposition: A | Discharge status: Home / Self Care | Payer: Self-pay | Source: Home / Self Care | Attending: Cardiovascular Disease

## 2016-10-23 DIAGNOSIS — I25810 Atherosclerosis of coronary artery bypass graft(s) without angina pectoris: Secondary | ICD-10-CM

## 2016-10-23 HISTORY — PX: LEFT HEART CATH AND CORS/GRAFTS ANGIOGRAPHY: CATH118250

## 2016-10-23 HISTORY — PX: CORONARY STENT INTERVENTION: CATH118234

## 2016-10-23 LAB — BASIC METABOLIC PANEL
Anion gap: 11 (ref 5–15)
BUN / CREAT RATIO: 10 (ref 10–24)
BUN: 11 mg/dL (ref 8–27)
BUN: 12 mg/dL (ref 6–20)
CHLORIDE: 100 mmol/L — AB (ref 101–111)
CO2: 20 mmol/L (ref 18–29)
CO2: 20 mmol/L — ABNORMAL LOW (ref 22–32)
CREATININE: 1.09 mg/dL (ref 0.76–1.27)
Calcium: 9.9 mg/dL (ref 8.6–10.2)
Calcium: 9.2 mg/dL (ref 8.9–10.3)
Chloride: 92 mmol/L — ABNORMAL LOW (ref 96–106)
Creatinine, Ser: 1.13 mg/dL (ref 0.61–1.24)
GFR calc Af Amer: 82 mL/min/{1.73_m2} (ref >59)
GFR calc Af Amer: >60 mL/min (ref >60)
GFR calc non Af Amer: >60 mL/min (ref >60)
GFR, EST NON AFRICAN AMERICAN: 71 mL/min/{1.73_m2} (ref >59)
GLUCOSE: 146 mg/dL — AB (ref 65–99)
Glucose, Bld: 146 mg/dL — ABNORMAL HIGH (ref 65–99)
POTASSIUM: 4.2 mmol/L (ref 3.5–5.2)
POTASSIUM: 4 mmol/L (ref 3.5–5.1)
SODIUM: 132 mmol/L — AB (ref 134–144)
SODIUM: 131 mmol/L — AB (ref 135–145)

## 2016-10-23 LAB — CBC
HCT: 34.5 % — ABNORMAL LOW (ref 39.0–52.0)
HEMATOCRIT: 39.6 % (ref 39.0–52.0)
HEMOGLOBIN: 13.7 g/dL (ref 13.0–17.0)
Hemoglobin: 12 g/dL — ABNORMAL LOW (ref 13.0–17.0)
MCH: 33.3 pg (ref 26.0–34.0)
MCH: 33.7 pg (ref 26.0–34.0)
MCHC: 34.6 g/dL (ref 30.0–36.0)
MCHC: 34.8 g/dL (ref 30.0–36.0)
MCV: 96.4 fL (ref 78.0–100.0)
MCV: 96.9 fL (ref 78.0–100.0)
PLATELETS: 183 10*3/uL (ref 150–400)
Platelets: 214 10*3/uL (ref 150–400)
RBC: 4.11 MIL/uL — ABNORMAL LOW (ref 4.22–5.81)
RBC: 3.56 MIL/uL — ABNORMAL LOW (ref 4.22–5.81)
RDW: 12.8 % (ref 11.5–15.5)
RDW: 13 % (ref 11.5–15.5)
WBC: 9.6 10*3/uL (ref 4.0–10.5)
WBC: 10.9 10*3/uL — AB (ref 4.0–10.5)

## 2016-10-23 LAB — GLUCOSE, CAPILLARY
GLUCOSE-CAPILLARY: 132 mg/dL — AB (ref 65–99)
GLUCOSE-CAPILLARY: 211 mg/dL — AB (ref 65–99)
Glucose-Capillary: 229 mg/dL — ABNORMAL HIGH (ref 65–99)
Glucose-Capillary: 161 mg/dL — ABNORMAL HIGH (ref 65–99)

## 2016-10-23 LAB — CREATININE, SERUM: CREATININE: 1.15 mg/dL (ref 0.61–1.24)

## 2016-10-23 LAB — TROPONIN I
TROPONIN I: 1.97 ng/mL — AB (ref <0.03)
TROPONIN I: 13.55 ng/mL — AB (ref <0.03)
TROPONIN I: 2.88 ng/mL — AB (ref <0.03)
TROPONIN I: 1.1 ng/mL — AB (ref <0.03)
Troponin I: 1.22 ng/mL (ref <0.03)

## 2016-10-23 LAB — POCT ACTIVATED CLOTTING TIME
Activated Clotting Time: 318 seconds
Activated Clotting Time: 235 seconds

## 2016-10-23 LAB — LIPID PANEL
CHOL/HDL RATIO: 6.1 ratio
Cholesterol: 219 mg/dL — ABNORMAL HIGH (ref 0–200)
HDL: 36 mg/dL — AB (ref >40)
LDL Cholesterol: UNDETERMINED mg/dL (ref 0–99)
Triglycerides: 509 mg/dL — ABNORMAL HIGH (ref <150)
VLDL: UNDETERMINED mg/dL (ref 0–40)

## 2016-10-23 LAB — MRSA PCR SCREENING: MRSA BY PCR: NEGATIVE

## 2016-10-23 LAB — HEPARIN LEVEL (UNFRACTIONATED): Heparin Unfractionated: 0.22 IU/mL — ABNORMAL LOW (ref 0.30–0.70)

## 2016-10-23 LAB — PROTIME-INR
INR: 1.01
PROTHROMBIN TIME: 13.3 s (ref 11.4–15.2)

## 2016-10-23 SURGERY — LEFT HEART CATH AND CORS/GRAFTS ANGIOGRAPHY
Anesthesia: LOCAL

## 2016-10-23 MED ORDER — HEPARIN SODIUM (PORCINE) 1000 UNIT/ML IJ SOLN
INTRAMUSCULAR | Status: AC
  Filled 2016-10-23: qty 1

## 2016-10-23 MED ORDER — HEPARIN (PORCINE) IN NACL 2-0.9 UNIT/ML-% IJ SOLN
INTRAMUSCULAR | Status: AC
  Filled 2016-10-23: qty 1000

## 2016-10-23 MED ORDER — VERAPAMIL HCL 2.5 MG/ML IV SOLN
INTRAVENOUS | Status: DC | PRN
  Administered 2016-10-23 (×3): 200 ug via INTRACORONARY

## 2016-10-23 MED ORDER — IOPAMIDOL (ISOVUE-370) INJECTION 76%
INTRAVENOUS | Status: DC | PRN
  Administered 2016-10-23: 195 mL via INTRAVENOUS

## 2016-10-23 MED ORDER — VERAPAMIL HCL 2.5 MG/ML IV SOLN
INTRAVENOUS | Status: DC | PRN
  Administered 2016-10-23: 09:00:00 via INTRA_ARTERIAL

## 2016-10-23 MED ORDER — HEPARIN BOLUS VIA INFUSION
1500.0000 [IU] | Freq: Once | INTRAVENOUS | Status: AC
  Administered 2016-10-23: 1500 [IU] via INTRAVENOUS
  Filled 2016-10-23: qty 1500

## 2016-10-23 MED ORDER — VERAPAMIL HCL 2.5 MG/ML IV SOLN
INTRAVENOUS | Status: AC
  Filled 2016-10-23: qty 2

## 2016-10-23 MED ORDER — MIDAZOLAM HCL 2 MG/2ML IJ SOLN
INTRAMUSCULAR | Status: AC
Start: 2016-10-23 — End: 2016-10-23
  Filled 2016-10-23: qty 2

## 2016-10-23 MED ORDER — LIDOCAINE HCL (PF) 1 % IJ SOLN
INTRAMUSCULAR | Status: AC
  Filled 2016-10-23: qty 30

## 2016-10-23 MED ORDER — LIDOCAINE HCL (PF) 1 % IJ SOLN
INTRAMUSCULAR | Status: DC | PRN
  Administered 2016-10-23: 2 mL

## 2016-10-23 MED ORDER — SODIUM CHLORIDE 0.9% FLUSH: 3.0000 mL | INTRAVENOUS | Status: DC | PRN

## 2016-10-23 MED ORDER — FENTANYL CITRATE (PF) 100 MCG/2ML IJ SOLN
INTRAMUSCULAR | Status: DC | PRN
  Administered 2016-10-23: 50 ug via INTRAVENOUS
  Administered 2016-10-23 (×3): 25 ug via INTRAVENOUS

## 2016-10-23 MED ORDER — HEPARIN SODIUM (PORCINE) 5000 UNIT/ML IJ SOLN
5000.0000 [IU] | Freq: Three times a day (TID) | INTRAMUSCULAR | Status: DC
  Administered 2016-10-23: 5000 [IU] via SUBCUTANEOUS

## 2016-10-23 MED ORDER — HEPARIN (PORCINE) IN NACL 2-0.9 UNIT/ML-% IJ SOLN
INTRAMUSCULAR | Status: DC | PRN
  Administered 2016-10-23: 1000 mL

## 2016-10-23 MED ORDER — IOPAMIDOL (ISOVUE-370) INJECTION 76%
INTRAVENOUS | Status: AC
  Filled 2016-10-23: qty 125

## 2016-10-23 MED ORDER — MORPHINE SULFATE (PF) 4 MG/ML IV SOLN
2.0000 mg | INTRAVENOUS | Status: DC | PRN
  Administered 2016-10-23 – 2016-10-25 (×16): 2 mg via INTRAVENOUS
  Filled 2016-10-23 (×14): qty 1

## 2016-10-23 MED ORDER — SODIUM CHLORIDE 0.9 % WEIGHT BASED INFUSION
1.0000 mL/kg/h | INTRAVENOUS | Status: AC
  Administered 2016-10-23: 1 mL/kg/h via INTRAVENOUS

## 2016-10-23 MED ORDER — NITROGLYCERIN IN D5W 200-5 MCG/ML-% IV SOLN
INTRAVENOUS | Status: DC | PRN
  Administered 2016-10-23: 5 ug/min via INTRAVENOUS

## 2016-10-23 MED ORDER — NITROGLYCERIN IN D5W 200-5 MCG/ML-% IV SOLN
0.0000 ug/min | INTRAVENOUS | Status: DC
  Administered 2016-10-23: 5 ug/min via INTRAVENOUS
  Administered 2016-10-24: 50 ug/min via INTRAVENOUS
  Administered 2016-10-25: 45 ug/min via INTRAVENOUS
  Filled 2016-10-23 (×2): qty 250

## 2016-10-23 MED ORDER — SODIUM CHLORIDE 0.9% FLUSH
3.0000 mL | Freq: Two times a day (BID) | INTRAVENOUS | Status: DC
  Administered 2016-10-25: 3 mL via INTRAVENOUS

## 2016-10-23 MED ORDER — SODIUM CHLORIDE 0.9 % IV SOLN: 250.0000 mL | INTRAVENOUS | Status: DC | PRN

## 2016-10-23 MED ORDER — FENTANYL CITRATE (PF) 100 MCG/2ML IJ SOLN
INTRAMUSCULAR | Status: AC
  Filled 2016-10-23: qty 2

## 2016-10-23 MED ORDER — MIDAZOLAM HCL 2 MG/2ML IJ SOLN
INTRAMUSCULAR | Status: DC | PRN
  Administered 2016-10-23 (×2): 1 mg via INTRAVENOUS

## 2016-10-23 MED ORDER — MORPHINE SULFATE (PF) 4 MG/ML IV SOLN
INTRAVENOUS | Status: AC
  Filled 2016-10-23: qty 1

## 2016-10-23 MED ORDER — NITROGLYCERIN IN D5W 200-5 MCG/ML-% IV SOLN
INTRAVENOUS | Status: AC
  Filled 2016-10-23: qty 250

## 2016-10-23 MED ORDER — HEPARIN SODIUM (PORCINE) 1000 UNIT/ML IJ SOLN
INTRAMUSCULAR | Status: DC | PRN
  Administered 2016-10-23: 4000 [IU] via INTRAVENOUS
  Administered 2016-10-23: 3000 [IU] via INTRAVENOUS
  Administered 2016-10-23: 4000 [IU] via INTRAVENOUS

## 2016-10-23 SURGICAL SUPPLY — 19 items
CATH INFINITI 5FR AL1 (CATHETERS) ×1 IMPLANT
CATH INFINITI 5FR MULTPACK ANG (CATHETERS) ×1 IMPLANT
CATH LAUNCHER 6FR AL1 (CATHETERS) IMPLANT
CATHETER LAUNCHER 6FR AL1 (CATHETERS) ×2
DEVICE RAD COMP TR BAND LRG (VASCULAR PRODUCTS) ×1 IMPLANT
DEVICE SPIDERFX EMB PROT 4MM (WIRE) ×3 IMPLANT
GLIDESHEATH SLEND SS 6F .021 (SHEATH) ×1 IMPLANT
GUIDEWIRE INQWIRE 1.5J.035X260 (WIRE) IMPLANT
INQWIRE 1.5J .035X260CM (WIRE) ×2
KIT ENCORE 26 ADVANTAGE (KITS) ×1 IMPLANT
KIT HEART LEFT (KITS) ×2 IMPLANT
PACK CARDIAC CATHETERIZATION (CUSTOM PROCEDURE TRAY) ×2 IMPLANT
STENT RESOLUTE ONYX 4.5X18 (Permanent Stent) ×1 IMPLANT
TRANSDUCER W/STOPCOCK (MISCELLANEOUS) ×2 IMPLANT
TUBING CIL FLEX 10 FLL-RA (TUBING) ×2 IMPLANT
WIRE ASAHI FIELDER XT 190CM (WIRE) ×1 IMPLANT
WIRE ASAHI MIRACLEBROS-3 180CM (WIRE) ×1 IMPLANT
WIRE ASAHI PROWATER 180CM (WIRE) ×2 IMPLANT
WIRE PT2 MS 185 (WIRE) ×1 IMPLANT

## 2016-10-23 NOTE — Interval H&P Note (Signed)
History and Physical Interval Note:  10/23/2016 8:30 AM  Jason [name]  has presented today for surgery, with the diagnosis of unstable angina/nstemi  The various methods of treatment have been discussed with the patient and family. After consideration of risks, benefits and other options for treatment, the patient has consented to  Procedure(s): Left Heart Cath and Cors/Grafts Angiography (N/A) as a surgical intervention .  The patient's history has been reviewed, patient examined, no change in status, stable for surgery.  I have reviewed the patient's chart and labs.  Questions were answered to the patient's satisfaction.    Cath Lab Visit (complete for each Cath Lab visit)  Clinical Evaluation Leading to the Procedure:   ACS: Yes.    Non-ACS:    Anginal Classification: CCS IV  Anti-ischemic medical therapy: Maximal Therapy (2 or more classes of medications)  Non-Invasive Test Results: No non-invasive testing performed  Prior CABG: Previous CABG       Sylvana Bonk [place] MD,FACC 10/23/2016 8:30 AM

## 2016-10-23 NOTE — Progress Notes (Signed)
ANTICOAGULATION CONSULT NOTE - Follow-up Consult  Pharmacy Consult for heparin Indication: NSTEMI  Allergies  Allergen Reactions  . Coreg [Carvedilol] Nausea And Vomiting and Other (See Comments)    Per patient made him dizzy and light sensitive  . Sulfa Antibiotics Nausea And Vomiting and Other (See Comments)    Also headaches     Patient Measurements: Weight: 164 lb 12.8 oz (74.8 kg) Heparin Dosing Weight: 76.7 kg  Vital Signs: Temp: 97.5 F (36.4 C) (05/02 2100) Temp Source: Oral (05/02 2100) BP: 108/58 (05/02 2100) Pulse Rate: 78 (05/02 2100)  Labs:  Recent Labs  10/22/16 1417 10/22/16 1509 10/22/16 1958 10/22/16 2357  HGB 14.0* 12.9*  --   --   HCT 40.2* 36.9*  --   --   PLT  --  210  --   --   HEPARINUNFRC  --   --   --  0.22*  CREATININE  --  1.25*  --   --   TROPONINI  --   --  1.21* 1.10*    Estimated Creatinine Clearance: 59.7 mL/min (A) (by C-G formula based on SCr of 1.25 mg/dL (H)).    Assessment: 66yo M on heparin for NSTEMI. Heparin level subtherapeutic (0.22) on gtt at 950 units/hr. No issues with line or bleeding reported per RN.  Goal of Therapy:  Heparin level 0.3-0.7 units/ml Monitor platelets by anticoagulation protocol: Yes   Plan:  Rebolus heparin 1500 units Increase gtt to 1150 units/hr Will f/u 6 hr heparin level  Sherlon Handing, PharmD, BCPS Clinical pharmacist, pager [phone] 10/23/2016 2:07 AM

## 2016-10-24 ENCOUNTER — Ambulatory Visit (HOSPITAL_COMMUNITY): Payer: Self-pay

## 2016-10-24 DIAGNOSIS — I5031 Acute diastolic (congestive) heart failure: Secondary | ICD-10-CM

## 2016-10-24 DIAGNOSIS — Z720 Tobacco use: Secondary | ICD-10-CM

## 2016-10-24 LAB — HEPARIN LEVEL (UNFRACTIONATED)
Heparin Unfractionated: 0.25 IU/mL — ABNORMAL LOW (ref 0.30–0.70)
Heparin Unfractionated: 0.39 IU/mL (ref 0.30–0.70)

## 2016-10-24 LAB — CBC
HCT: 38.4 % — ABNORMAL LOW (ref 39.0–52.0)
Hemoglobin: 13.1 g/dL (ref 13.0–17.0)
MCH: 33.4 pg (ref 26.0–34.0)
MCHC: 34.1 g/dL (ref 30.0–36.0)
MCV: 98 fL (ref 78.0–100.0)
PLATELETS: 218 10*3/uL (ref 150–400)
RBC: 3.92 MIL/uL — ABNORMAL LOW (ref 4.22–5.81)
RDW: 12.9 % (ref 11.5–15.5)
WBC: 12.3 10*3/uL — AB (ref 4.0–10.5)

## 2016-10-24 LAB — HEMOGLOBIN A1C
Hgb A1c MFr Bld: 8.7 % — ABNORMAL HIGH (ref 4.8–5.6)
Mean Plasma Glucose: 203 mg/dL

## 2016-10-24 LAB — GLUCOSE, CAPILLARY
GLUCOSE-CAPILLARY: 191 mg/dL — AB (ref 65–99)
GLUCOSE-CAPILLARY: 195 mg/dL — AB (ref 65–99)
GLUCOSE-CAPILLARY: 196 mg/dL — AB (ref 65–99)
Glucose-Capillary: 203 mg/dL — ABNORMAL HIGH (ref 65–99)

## 2016-10-24 LAB — BASIC METABOLIC PANEL
Anion gap: 9 (ref 5–15)
BUN: 7 mg/dL (ref 6–20)
CALCIUM: 9.2 mg/dL (ref 8.9–10.3)
CO2: 22 mmol/L (ref 22–32)
CREATININE: 1.02 mg/dL (ref 0.61–1.24)
Chloride: 99 mmol/L — ABNORMAL LOW (ref 101–111)
Glucose, Bld: 192 mg/dL — ABNORMAL HIGH (ref 65–99)
Potassium: 4.5 mmol/L (ref 3.5–5.1)
SODIUM: 130 mmol/L — AB (ref 135–145)

## 2016-10-24 LAB — TROPONIN I: TROPONIN I: 16.67 ng/mL — AB (ref <0.03)

## 2016-10-24 MED ORDER — RANOLAZINE ER 500 MG PO TB12
1000.0000 mg | ORAL_TABLET | Freq: Two times a day (BID) | ORAL | Status: DC
  Administered 2016-10-24 – 2016-10-26 (×5): 1000 mg via ORAL
  Filled 2016-10-24 (×5): qty 2

## 2016-10-24 MED ORDER — FUROSEMIDE 10 MG/ML IJ SOLN
40.0000 mg | Freq: Two times a day (BID) | INTRAMUSCULAR | Status: DC
  Administered 2016-10-24 – 2016-10-25 (×2): 40 mg via INTRAVENOUS
  Filled 2016-10-24 (×2): qty 4

## 2016-10-24 MED ORDER — ATORVASTATIN CALCIUM 80 MG PO TABS
80.0000 mg | ORAL_TABLET | Freq: Every day | ORAL | Status: DC
  Administered 2016-10-24 – 2016-10-26 (×3): 80 mg via ORAL
  Filled 2016-10-24 (×3): qty 1

## 2016-10-24 MED ORDER — NICOTINE 21 MG/24HR TD PT24
21.0000 mg | MEDICATED_PATCH | Freq: Every day | TRANSDERMAL | Status: DC
  Administered 2016-10-24 – 2016-10-26 (×3): 21 mg via TRANSDERMAL
  Filled 2016-10-24 (×3): qty 1

## 2016-10-24 MED ORDER — HEPARIN (PORCINE) IN NACL 100-0.45 UNIT/ML-% IJ SOLN
1300.0000 [IU]/h | INTRAMUSCULAR | Status: DC
  Administered 2016-10-24: 1150 [IU]/h via INTRAVENOUS
  Administered 2016-10-25: 1300 [IU]/h via INTRAVENOUS
  Filled 2016-10-24 (×2): qty 250

## 2016-10-24 MED ORDER — FUROSEMIDE 10 MG/ML IJ SOLN
40.0000 mg | Freq: Once | INTRAMUSCULAR | Status: AC
  Administered 2016-10-24: 40 mg via INTRAVENOUS
  Filled 2016-10-24: qty 4

## 2016-10-24 NOTE — Progress Notes (Signed)
ANTICOAGULATION CONSULT NOTE - Follow-up Consult  Pharmacy Consult for heparin Indication: NSTEMI  Allergies  Allergen Reactions  . Coreg [Carvedilol] Nausea And Vomiting and Other (See Comments)    Per patient made him dizzy and light sensitive  . Sulfa Antibiotics Nausea And Vomiting and Other (See Comments)    Also headaches     Patient Measurements: Weight: 163 lb 11.2 oz (74.3 kg) Heparin Dosing Weight: 76.7 kg  Vital Signs: Temp: 97.6 F (36.4 C) (05/04 0901) Temp Source: Oral (05/04 0901) BP: 107/69 (05/04 1000) Pulse Rate: 81 (05/04 1023)  Labs:  Recent Labs  10/22/16 2357 10/23/16 0528  10/23/16 1555 10/23/16 2236 10/24/16 0224 10/24/16 1016  HGB  --  13.7  --  12.0*  --  13.1  --   HCT  --  39.6  --  34.5*  --  38.4*  --   PLT  --  214  --  183  --  218  --   LABPROT  --  13.3  --   --   --   --   --   INR  --  1.01  --   --   --   --   --   HEPARINUNFRC 0.22*  --   --   --   --   --  0.25*  CREATININE  --  1.13  --  1.15  --  1.02  --   TROPONINI 1.10* 1.22*  < > 2.88* 13.55* 16.67*  --   < > = values in this interval not displayed.  Estimated Creatinine Clearance: 73.2 mL/min (by C-G formula based on SCr of 1.02 mg/dL).  Assessment: 66yo M on heparin for NSTEMI. s/p cath 5/3 with PCI. Pt now with recurrent CP and ECG changes so restarted heparin gtt on 5/3 PM -HL 0.25 -no bleeding noted -CBC WNL  Goal of Therapy:  Heparin level 0.3-0.7 units/ml Monitor platelets by anticoagulation protocol: Yes   Plan:  -Increase heparin gtt to 1300 units/hr, no bolus -Will f/u 6 hr heparin level -Daily CBC/HL -Monitor S/S bleeding  Myer Peer Grayland Ormond), PharmD  PGY1 Pharmacy Resident Pager: [phone] 10/24/2016 11:28 AM

## 2016-10-24 NOTE — Progress Notes (Signed)
Progress Note  Patient Name: Jason [name] Date of Encounter: 10/24/2016  Primary Cardiologist: Claiborne Billings  Subjective   Severe chest pain overnight. No chest pain this am. No dyspnea this am.   Inpatient Medications    Scheduled Meds: . aspirin EC  81 mg Oral Daily  . atorvastatin  40 mg Oral q1800  . clopidogrel  75 mg Oral Daily  . insulin aspart  0-9 Units Subcutaneous TID WC  . levothyroxine  50 mcg Oral QAC breakfast  . loratadine  10 mg Oral Daily  . metoprolol succinate  100 mg Oral Daily  . sertraline  50 mg Oral Daily  . sodium chloride flush  3 mL Intravenous Q12H  . sodium chloride flush  3 mL Intravenous Q12H   Continuous Infusions: . sodium chloride 250 mL (10/23/16 1002)  . sodium chloride    . heparin 1,150 Units/hr (10/24/16 0354)  . nitroGLYCERIN 50 mcg/min (10/24/16 0255)   PRN Meds: sodium chloride, sodium chloride, acetaminophen, ALPRAZolam, morphine injection, nitroGLYCERIN, ondansetron (ZOFRAN) IV, sodium chloride flush, sodium chloride flush   Vital Signs    Vitals:   10/24/16 0245 10/24/16 0300 10/24/16 0400 10/24/16 0500  BP: 133/76 108/66 104/63   Pulse: 85 87 82   Resp: (!) 29 (!) 22 (!) 22   Temp:  97.4 F (36.3 C)    TempSrc:  Oral    SpO2: 97% 93% 95%   Weight:    163 lb 11.2 oz (74.3 kg)    Intake/Output Summary (Last 24 hours) at 10/24/16 0819 Last data filed at 10/24/16 6433  Gross per 24 hour  Intake           690.24 ml  Output             3200 ml  Net         -2509.76 ml   Filed Weights   10/22/16 2100 10/23/16 0300 10/24/16 0500  Weight: 164 lb 12.8 oz (74.8 kg) 164 lb 12 oz (74.7 kg) 163 lb 11.2 oz (74.3 kg)    Telemetry    sinus - Personally Reviewed  ECG    Sinus, RBBB, LAFB, diffuse ST depression, improved from admission- Personally Reviewed  Physical Exam   GEN: No acute distress.   Neck: No JVD Cardiac: RRR, no murmurs, rubs, or gallops.  Respiratory: Clear to auscultation bilaterally. GI: Soft,  nontender, non-distended  MS: No edema; No deformity. Neuro:  Nonfocal  Psych: Normal affect   Labs    Chemistry Recent Labs Lab 10/22/16 1509 10/23/16 0528 10/23/16 1555 10/24/16 0224  NA 127* 131*  --  130*  K 3.7 4.0  --  4.5  CL 98* 100*  --  99*  CO2 21* 20*  --  22  GLUCOSE 135* 146*  --  192*  BUN 8 12  --  7  CREATININE 1.25* 1.13 1.15 1.02  CALCIUM 9.4 9.2  --  9.2  GFRNONAA 59* >60 >60 >60  GFRAA >60 >60 >60 >60  ANIONGAP 8 11  --  9     Hematology Recent Labs Lab 10/23/16 0528 10/23/16 1555 10/24/16 0224  WBC 9.6 10.9* 12.3*  RBC 4.11* 3.56* 3.92*  HGB 13.7 12.0* 13.1  HCT 39.6 34.5* 38.4*  MCV 96.4 96.9 98.0  MCH 33.3 33.7 33.4  MCHC 34.6 34.8 34.1  RDW 12.8 13.0 12.9  PLT 214 183 218    Cardiac Enzymes Recent Labs Lab 10/23/16 1257 10/23/16 1555 10/23/16 2236 10/24/16 0224  TROPONINI  1.97* 2.88* 13.55* 16.67*    Recent Labs Lab 10/22/16 1523  TROPIPOC 0.45*     BNP Recent Labs Lab 10/22/16 1509  BNP 272.8*     DDimer No results for input(s): DDIMER in the last 168 hours.   Radiology    Dg Chest 2 View  Result Date: 10/22/2016 CLINICAL DATA:  66 y/o  M; chest pain. EXAM: CHEST  2 VIEW COMPARISON:  10/22/2016 chest radiograph. FINDINGS: Stable normal cardiac silhouette. Aortic atherosclerosis with calcification. Clear lungs. No pleural effusion or pneumothorax. Post median sternotomy with wires and alignment. No acute osseous abnormality is evident. Upper lumbar spine degenerative changes. IMPRESSION: No acute pulmonary process identified. Electronically Signed   By: Kristine Garbe M.D.   On: 10/22/2016 16:03   Dg Chest 2 View  Result Date: 10/22/2016 CLINICAL DATA:  Chest pain, cough. EXAM: CHEST  2 VIEW COMPARISON:  Radiographs of September 14, 2015. FINDINGS: The heart size and mediastinal contours are within normal limits. Both lungs are clear. Status post coronary artery bypass graft. No pneumothorax or pleural effusion  is noted. The visualized skeletal structures are unremarkable. Atherosclerosis of thoracic aorta is noted. IMPRESSION: No active cardiopulmonary disease. Electronically Signed   By: Marijo Conception, M.D.   On: 10/22/2016 14:03    Cardiac Studies   Cardiac cath 10/23/16: Conclusion     Prox RCA to Mid RCA lesion, 100 %stenosed.  Ost LAD to Dist LAD lesion, 75 %stenosed.  1st Diag lesion, 50 %stenosed.  Ost 1st Mrg to 1st Mrg lesion, 100 %stenosed.  Ost Cx to Prox Cx lesion, 99 %stenosed.  Ost 2nd Mrg to 2nd Mrg lesion, 100 %stenosed.  SVG.  Origin lesion, 100 %stenosed.  Seq SVG-.  LIMA graft was visualized by angiography and is normal in caliber and anatomically normal.  LV end diastolic pressure is moderately elevated.  A STENT RESOLUTE ONYX 4.5X18 drug eluting stent was successfully placed.  Dist Graft to Insertion lesion, 95 %stenosed.  Post intervention, there is a 0% residual stenosis.   1. Severe 3 vessel obstructive CAD. 2. Patent LIMA to the LAD 3. Patent SVG sequentially to OM1 and OM2 but with critical thrombotic lesion in the body of the SVG 4. Occluded SVG to the RCA- chronic 5. Moderately elevated LVEDP 6. Successful stenting of SVG to OM 2. Procedure complicated by no reflow phenomena and occlusion of the first OM.      Patient Profile     66 y.o. male with ongoing tobacco abuse, CAD s/p CABG admitted with unstable angina/NSTEMI  Assessment & Plan    1. CAD s/p CABG/NSTEMI: Pt admitted with chest pain, NSTEMI. Cardiac cath 10/23/16 per Dr. Martinique with severe stenosis in the vein graft sequential to OM1 and OM2. This was treated with a drug eluting stent but flow lost into the small first OM branch. The vein graft to the RCA is occluded chronically. Patent LIMA to LAD. He has had severe chest pain overnight. He has received narcotics for pain.  -Continue IV NTG -Continue IV heparin -Continue ASA, Plavix.  -Increase Lipitor to 80 mg daily -No good  options to open the occluded OM branch  2. Acute diastolic CHF: Normal LV systolic function by echo 09/12/16. He is now infarcting his lateral wall due to acute occlusion of the first OM branch. LVEDP is elevated by cath. He received one dose of IV Lasix this am. Will start Lasix 40 mg IV BID.   3. Tobacco abuse: Smoking cessation is recommended.  Signed, Lauree Chandler, MD  10/24/2016, 8:19 AM

## 2016-10-24 NOTE — Progress Notes (Signed)
ANTICOAGULATION CONSULT NOTE - Follow-up Consult  Pharmacy Consult for heparin Indication: NSTEMI  Allergies  Allergen Reactions  . Coreg [Carvedilol] Nausea And Vomiting and Other (See Comments)    Per patient made him dizzy and light sensitive  . Sulfa Antibiotics Nausea And Vomiting and Other (See Comments)    Also headaches     Patient Measurements: Weight: 164 lb 12 oz (74.7 kg) Heparin Dosing Weight: 76.7 kg  Vital Signs: Temp: 97.8 F (36.6 C) (05/04 0000) Temp Source: Oral (05/04 0000) BP: 133/76 (05/04 0245) Pulse Rate: 85 (05/04 0245)  Labs:  Recent Labs  10/22/16 2357 10/23/16 0528  10/23/16 1555 10/23/16 2236 10/24/16 0224  HGB  --  13.7  --  12.0*  --  13.1  HCT  --  39.6  --  34.5*  --  38.4*  PLT  --  214  --  183  --  218  LABPROT  --  13.3  --   --   --   --   INR  --  1.01  --   --   --   --   HEPARINUNFRC 0.22*  --   --   --   --   --   CREATININE  --  1.13  --  1.15  --  1.02  TROPONINI 1.10* 1.22*  < > 2.88* 13.55* 16.67*  < > = values in this interval not displayed.  Estimated Creatinine Clearance: 73.2 mL/min (by C-G formula based on SCr of 1.02 mg/dL).  Assessment: 66yo M on heparin for NSTEMI. s/p cath 5/3 with PCI. Pt now with recurrent CP and ECG changes so restarting heparin gtt. Pt previously subtherapeutic on 950 units/hr. CBC stable.  Goal of Therapy:  Heparin level 0.3-0.7 units/ml Monitor platelets by anticoagulation protocol: Yes   Plan:  Restart heparin gtt at 1150 units/hr Will f/u 6 hr heparin level  Sherlon Handing, PharmD, BCPS Clinical pharmacist, pager [phone] 10/24/2016 3:30 AM

## 2016-10-24 NOTE — Progress Notes (Signed)
ANTICOAGULATION CONSULT NOTE - Follow-up Consult  Pharmacy Consult for heparin Indication: NSTEMI  Allergies  Allergen Reactions  . Coreg [Carvedilol] Nausea And Vomiting and Other (See Comments)    Per patient made him dizzy and light sensitive  . Sulfa Antibiotics Nausea And Vomiting and Other (See Comments)    Also headaches     Patient Measurements: Weight: 163 lb 11.2 oz (74.3 kg) Heparin Dosing Weight: 76.7 kg  Vital Signs: Temp: 98.2 F (36.8 C) (05/04 1528) Temp Source: Oral (05/04 1528) BP: 131/72 (05/04 1700) Pulse Rate: 70 (05/04 1800)  Labs:  Recent Labs  10/22/16 2357 10/23/16 0528  10/23/16 1555 10/23/16 2236 10/24/16 0224 10/24/16 1016 10/24/16 1901  HGB  --  13.7  --  12.0*  --  13.1  --   --   HCT  --  39.6  --  34.5*  --  38.4*  --   --   PLT  --  214  --  183  --  218  --   --   LABPROT  --  13.3  --   --   --   --   --   --   INR  --  1.01  --   --   --   --   --   --   HEPARINUNFRC 0.22*  --   --   --   --   --  0.25* 0.39  CREATININE  --  1.13  --  1.15  --  1.02  --   --   TROPONINI 1.10* 1.22*  < > 2.88* 13.55* 16.67*  --   --   < > = values in this interval not displayed.  Estimated Creatinine Clearance: 73.2 mL/min (by C-G formula based on SCr of 1.02 mg/dL).  Assessment: 66yo M on heparin for NSTEMI. s/p cath 5/3 with PCI.  Patient restarted on IV heparin  for recurrent CP and ECG change.  The 6 hour HL is 0.39 on heparin drip 1300 units/hr. HL is therapeutic -no bleeding noted. CBC WNL earlier today.   Goal of Therapy:  Heparin level 0.3-0.7 units/ml Monitor platelets by anticoagulation protocol: Yes   Plan:  Continue heparin gtt  1300 units/hr f/u AM HL -Daily CBC/HL -Monitor S/S bleeding  Nicole Cella, RPh Clinical Pharmacist Pager: [phone] 10/24/2016 8:02 PM

## 2016-10-24 NOTE — Progress Notes (Signed)
Interventional Cardiology  Clinical scenario discussed with Dr. Overton Mam.   Unable to see most recent ECG.  Digital images reviewed - demonstrating occlusion of the larger first OM of sequential graft to OM 1 and 2 by stent placed earlier today. Also had slow flow in th entire territory. Stent was placed with good initial appearance. Patent LIMA to LAD. RCA is collateralized by LAD.   Plan continue medical therapy. Generous analgesia. Resume IV heparin.   Guarded prognosis. Watch for development of CHF.

## 2016-10-24 NOTE — Progress Notes (Addendum)
Cardiology cross cover note  Called by nursing staff around 0200 after patient was noted to have progressively worsening (up to 8/10) SS chest pressure radiating to his left shoulder a/w nausea and SOB.  Has a hx of CABG.  He was admitted on 5/2 with an NSTEMI and taken for LHC on 5/3 where DES was placed to his SVG-OM1 complicated by no-reflow phenomenon.  We have increased his IV NTG to 50 mcg/min and given IV morphine and zofran with some improvement in his symptoms.  Repeat ECG shows old bifascicular block with worsening anterior ST depression.  VSS  PE:  BP 133/76   Pulse 85   Temp 97.8 F (36.6 C) (Oral)   Resp (!) 29   Wt 74.7 kg (164 lb 12 oz)   SpO2 97%   BMI 24.33 kg/m  Gen: mild distress Lungs: decreased throughout but no evidence of crackles Heart: RRR, no crmg Ext: no edema  I called and spoke with interventionalist on call, Dr. Tamala Julian, and reviewed pts case.  Unfortunately, does not sound like he has many good targets for further PCI, thus we will continue to pursue medical management. Dr. Tamala Julian will review his cath films and let me know if he has any further recommendations for intervention.  For now, will give another dose of morphine and start back on heparin gtt and give a dose of 40mg  IV lasix as his LVEDP was elevated at the time of LHC.   Also obtained repeat troponin.   Regino Bellow, DO 3:06 AM

## 2016-10-25 DIAGNOSIS — I5043 Acute on chronic combined systolic (congestive) and diastolic (congestive) heart failure: Secondary | ICD-10-CM | POA: Diagnosis present

## 2016-10-25 DIAGNOSIS — Z951 Presence of aortocoronary bypass graft: Secondary | ICD-10-CM

## 2016-10-25 DIAGNOSIS — Z9861 Coronary angioplasty status: Secondary | ICD-10-CM

## 2016-10-25 DIAGNOSIS — I2510 Atherosclerotic heart disease of native coronary artery without angina pectoris: Secondary | ICD-10-CM

## 2016-10-25 LAB — CBC
HEMATOCRIT: 32.3 % — AB (ref 39.0–52.0)
HEMOGLOBIN: 10.8 g/dL — AB (ref 13.0–17.0)
MCH: 32.5 pg (ref 26.0–34.0)
MCHC: 33.4 g/dL (ref 30.0–36.0)
MCV: 97.3 fL (ref 78.0–100.0)
Platelets: 183 10*3/uL (ref 150–400)
RBC: 3.32 MIL/uL — AB (ref 4.22–5.81)
RDW: 12.8 % (ref 11.5–15.5)
WBC: 11.3 10*3/uL — AB (ref 4.0–10.5)

## 2016-10-25 LAB — BASIC METABOLIC PANEL
ANION GAP: 13 (ref 5–15)
BUN: 11 mg/dL (ref 6–20)
CHLORIDE: 92 mmol/L — AB (ref 101–111)
CO2: 22 mmol/L (ref 22–32)
CREATININE: 1.34 mg/dL — AB (ref 0.61–1.24)
Calcium: 8.7 mg/dL — ABNORMAL LOW (ref 8.9–10.3)
GFR calc non Af Amer: 54 mL/min — ABNORMAL LOW (ref >60)
Glucose, Bld: 170 mg/dL — ABNORMAL HIGH (ref 65–99)
POTASSIUM: 3.8 mmol/L (ref 3.5–5.1)
SODIUM: 127 mmol/L — AB (ref 135–145)

## 2016-10-25 LAB — GLUCOSE, CAPILLARY
GLUCOSE-CAPILLARY: 124 mg/dL — AB (ref 65–99)
Glucose-Capillary: 176 mg/dL — ABNORMAL HIGH (ref 65–99)
Glucose-Capillary: 221 mg/dL — ABNORMAL HIGH (ref 65–99)
Glucose-Capillary: 209 mg/dL — ABNORMAL HIGH (ref 65–99)

## 2016-10-25 LAB — HEPARIN LEVEL (UNFRACTIONATED): HEPARIN UNFRACTIONATED: 0.43 [IU]/mL (ref 0.30–0.70)

## 2016-10-25 MED ORDER — ISOSORBIDE MONONITRATE ER 30 MG PO TB24
30.0000 mg | ORAL_TABLET | Freq: Every day | ORAL | Status: DC
  Administered 2016-10-25 – 2016-10-26 (×2): 30 mg via ORAL
  Filled 2016-10-25 (×2): qty 1

## 2016-10-25 MED ORDER — FUROSEMIDE 40 MG PO TABS
40.0000 mg | ORAL_TABLET | Freq: Every day | ORAL | Status: DC
  Administered 2016-10-26: 40 mg via ORAL
  Filled 2016-10-25: qty 1

## 2016-10-25 NOTE — Progress Notes (Signed)
ANTICOAGULATION CONSULT NOTE - Follow-up Consult  Pharmacy Consult for heparin Indication: NSTEMI  Allergies  Allergen Reactions  . Coreg [Carvedilol] Nausea And Vomiting and Other (See Comments)    Per patient made him dizzy and light sensitive  . Sulfa Antibiotics Nausea And Vomiting and Other (See Comments)    Also headaches     Patient Measurements: Weight: 169 lb 3.2 oz (76.7 kg) Heparin Dosing Weight: 76.7 kg  Vital Signs: Temp: 97.5 F (36.4 C) (05/05 0849) Temp Source: Oral (05/05 0849) BP: 106/76 (05/05 0849) Pulse Rate: 65 (05/05 0849)  Labs:  Recent Labs  10/23/16 0528  10/23/16 1555 10/23/16 2236 10/24/16 0224 10/24/16 1016 10/24/16 1901 10/25/16 0300  HGB 13.7  --  12.0*  --  13.1  --   --  10.8*  HCT 39.6  --  34.5*  --  38.4*  --   --  32.3*  PLT 214  --  183  --  218  --   --  183  LABPROT 13.3  --   --   --   --   --   --   --   INR 1.01  --   --   --   --   --   --   --   HEPARINUNFRC  --   --   --   --   --  0.25* 0.39 0.43  CREATININE 1.13  --  1.15  --  1.02  --   --  1.34*  TROPONINI 1.22*  < > 2.88* 13.55* 16.67*  --   --   --   < > = values in this interval not displayed.  Estimated Creatinine Clearance: 55.7 mL/min (A) (by C-G formula based on SCr of 1.34 mg/dL (H)).  Assessment: 66yo M on heparin for NSTEMI. s/p cath 5/3 with PCI.  Patient restarted on IV heparin for recurrent CP and ECG change.   The 6 hour HL is 0.43 on heparin drip 1300 units/hr. HL is therapeutic. CBC WNL earlier today.   Goal of Therapy:  Heparin level 0.3-0.7 units/ml Monitor platelets by anticoagulation protocol: Yes   Plan:  Continue heparin gtt 1300 units/hr -Daily CBC/HL -Monitor S/S bleeding  Albertina Parr, PharmD., BCPS Clinical Pharmacist Pager [phone]

## 2016-10-25 NOTE — Progress Notes (Signed)
Progress Note  Patient Name: Jason [name] Date of Encounter: 10/25/2016  Primary Cardiologist: Claiborne Billings  Subjective   Known to me from prior hospitalizations. Severe chest pain yesterday. Mild chest pain this am. He remains on IV Nitro and heparin.  Inpatient Medications    Scheduled Meds: . aspirin EC  81 mg Oral Daily  . atorvastatin  80 mg Oral q1800  . clopidogrel  75 mg Oral Daily  . furosemide  40 mg Intravenous BID  . insulin aspart  0-9 Units Subcutaneous TID WC  . levothyroxine  50 mcg Oral QAC breakfast  . loratadine  10 mg Oral Daily  . metoprolol succinate  100 mg Oral Daily  . nicotine  21 mg Transdermal Daily  . ranolazine  1,000 mg Oral BID  . sertraline  50 mg Oral Daily  . sodium chloride flush  3 mL Intravenous Q12H  . sodium chloride flush  3 mL Intravenous Q12H   Continuous Infusions: . sodium chloride 250 mL (10/23/16 1002)  . sodium chloride    . heparin 1,300 Units/hr (10/25/16 0800)  . nitroGLYCERIN 40 mcg/min (10/25/16 1100)   PRN Meds: sodium chloride, sodium chloride, acetaminophen, ALPRAZolam, morphine injection, nitroGLYCERIN, ondansetron (ZOFRAN) IV, sodium chloride flush, sodium chloride flush   Vital Signs    Vitals:   10/25/16 0620 10/25/16 0800 10/25/16 0849 10/25/16 1200  BP:  108/62 106/76 (!) 105/55  Pulse:  87 65 79  Resp:   18 (!) 23  Temp:   97.5 F (36.4 C)   TempSrc:   Oral   SpO2:  100% 95% 97%  Weight: 169 lb 3.2 oz (76.7 kg)       Intake/Output Summary (Last 24 hours) at 10/25/16 1226 Last data filed at 10/25/16 1100  Gross per 24 hour  Intake             1545 ml  Output             1925 ml  Net             -380 ml   Filed Weights   10/23/16 0300 10/24/16 0500 10/25/16 0620  Weight: 164 lb 12 oz (74.7 kg) 163 lb 11.2 oz (74.3 kg) 169 lb 3.2 oz (76.7 kg)    Telemetry    sinus rhythm- Personally Reviewed  ECG    None today  Physical Exam   General appearance: alert and no distress Neck: no carotid  bruit and no JVD Lungs: clear to auscultation bilaterally Heart: regular rate and rhythm Abdomen: soft, non-tender; bowel sounds normal; no masses,  no organomegaly Extremities: extremities normal, atraumatic, no cyanosis or edema Pulses: 2+ and symmetric Skin: Skin color, texture, turgor normal. No rashes or lesions Neurologic: Grossly normal Psych: Pleasant   Labs    Chemistry  Recent Labs Lab 10/23/16 0528 10/23/16 1555 10/24/16 0224 10/25/16 0300  NA 131*  --  130* 127*  K 4.0  --  4.5 3.8  CL 100*  --  99* 92*  CO2 20*  --  22 22  GLUCOSE 146*  --  192* 170*  BUN 12  --  7 11  CREATININE 1.13 1.15 1.02 1.34*  CALCIUM 9.2  --  9.2 8.7*  GFRNONAA >60 >60 >60 54*  GFRAA >60 >60 >60 >60  ANIONGAP 11  --  9 13     Hematology  Recent Labs Lab 10/23/16 1555 10/24/16 0224 10/25/16 0300  WBC 10.9* 12.3* 11.3*  RBC 3.56* 3.92* 3.32*  HGB 12.0* 13.1 10.8*  HCT 34.5* 38.4* 32.3*  MCV 96.9 98.0 97.3  MCH 33.7 33.4 32.5  MCHC 34.8 34.1 33.4  RDW 13.0 12.9 12.8  PLT 183 218 183    Cardiac Enzymes  Recent Labs Lab 10/23/16 1257 10/23/16 1555 10/23/16 2236 10/24/16 0224  TROPONINI 1.97* 2.88* 13.55* 16.67*     Recent Labs Lab 10/22/16 1523  TROPIPOC 0.45*     BNP  Recent Labs Lab 10/22/16 1509  BNP 272.8*     DDimer No results for input(s): DDIMER in the last 168 hours.   Radiology    No results found.  Cardiac Studies   Cardiac cath 10/23/16: Conclusion     Prox RCA to Mid RCA lesion, 100 %stenosed.  Ost LAD to Dist LAD lesion, 75 %stenosed.  1st Diag lesion, 50 %stenosed.  Ost 1st Mrg to 1st Mrg lesion, 100 %stenosed.  Ost Cx to Prox Cx lesion, 99 %stenosed.  Ost 2nd Mrg to 2nd Mrg lesion, 100 %stenosed.  SVG.  Origin lesion, 100 %stenosed.  Seq SVG-.  LIMA graft was visualized by angiography and is normal in caliber and anatomically normal.  LV end diastolic pressure is moderately elevated.  A STENT RESOLUTE ONYX  4.5X18 drug eluting stent was successfully placed.  Dist Graft to Insertion lesion, 95 %stenosed.  Post intervention, there is a 0% residual stenosis.   1. Severe 3 vessel obstructive CAD. 2. Patent LIMA to the LAD 3. Patent SVG sequentially to OM1 and OM2 but with critical thrombotic lesion in the body of the SVG 4. Occluded SVG to the RCA- chronic 5. Moderately elevated LVEDP 6. Successful stenting of SVG to OM 2. Procedure complicated by no reflow phenomena and occlusion of the first OM.      Patient Profile     66 y.o. male with ongoing tobacco abuse, CAD s/p CABG admitted with unstable angina/NSTEMI  Assessment & Plan    1. CAD s/p CABG/NSTEMI: Pt admitted with chest pain, NSTEMI. Cardiac cath 10/23/16 per Dr. Martinique with severe stenosis in the vein graft sequential to OM1 and OM2. This was treated with a drug eluting stent but flow lost into the small first OM branch. The vein graft to the RCA is occluded chronically. Patent LIMA to LAD. He has had severe chest pain overnight. He has received narcotics for pain.  -Discontinue IV NTG - switch to imdur 30 mg daily -Discontinue IV heparin -Continue ASA, Plavix.  -Increase Lipitor to 80 mg daily -No good options to open the occluded OM branch  2. Acute diastolic CHF: Normal LV systolic function by echo 09/12/16. He is now infarcting his lateral wall due to acute occlusion of the first OM branch. LVEDP is elevated by cath. He received one dose of IV Lasix this am. Will start Lasix 40 mg IV BID. Diuresed about 2.5L negative. Appears euvolemic on exam. Switch to oral lasix. Echo pending today.  3. Tobacco abuse: Smoking cessation is recommended.   Anticipate possible d/c home tomorrow.  Pixie Casino, MD, [place]  Attending Cardiologist  Direct Dial: [phone]  Fax: [phone]  Website:  [email or web address]  Pixie Casino, MD  10/25/2016, 12:26 PM

## 2016-10-25 NOTE — Progress Notes (Signed)
CARDIAC REHAB PHASE I   PRE:  Rate/Rhythm: 92  BP:  Sitting: 102/58     SaO2: 95 ra  MODE:  Ambulation: 400 ft   POST:  Rate/Rhythm: 97  BP:  Sitting: 119/70     SaO2: 97 ra  1:10p-1:45p Patient ambulated at a slow and steady pace holding onto IV pole. Before starting walk he stated that his pain was 3.5/10. During walk he stated that it did not change from 3.5/10. He did say he felt a little "fuzzy in the head". Complained of hip pain which is normal to him. Education completed. He is interested in Cardiac Rehab in [place] if his Medicaid will pay for it (once he gets it). He wants to come to [facility] instead of [facility] even though he lives in [place]. Will place referral. Patient is back in chair eating lunch. Call bell in reach.   [place], MS 10/25/2016 1:38 PM

## 2016-10-26 ENCOUNTER — Other Ambulatory Visit (HOSPITAL_COMMUNITY): Payer: Self-pay

## 2016-10-26 ENCOUNTER — Other Ambulatory Visit: Payer: Self-pay | Admitting: Cardiology

## 2016-10-26 DIAGNOSIS — Z794 Long term (current) use of insulin: Secondary | ICD-10-CM

## 2016-10-26 DIAGNOSIS — I10 Essential (primary) hypertension: Secondary | ICD-10-CM

## 2016-10-26 DIAGNOSIS — E1159 Type 2 diabetes mellitus with other circulatory complications: Secondary | ICD-10-CM

## 2016-10-26 DIAGNOSIS — I214 Non-ST elevation (NSTEMI) myocardial infarction: Secondary | ICD-10-CM

## 2016-10-26 DIAGNOSIS — I739 Peripheral vascular disease, unspecified: Secondary | ICD-10-CM

## 2016-10-26 LAB — CBC
HEMATOCRIT: 32.8 % — AB (ref 39.0–52.0)
Hemoglobin: 11 g/dL — ABNORMAL LOW (ref 13.0–17.0)
MCH: 32.6 pg (ref 26.0–34.0)
MCHC: 33.5 g/dL (ref 30.0–36.0)
MCV: 97.3 fL (ref 78.0–100.0)
PLATELETS: 175 10*3/uL (ref 150–400)
RBC: 3.37 MIL/uL — AB (ref 4.22–5.81)
RDW: 12.7 % (ref 11.5–15.5)
WBC: 8.2 10*3/uL (ref 4.0–10.5)

## 2016-10-26 LAB — HEPARIN LEVEL (UNFRACTIONATED): Heparin Unfractionated: <0.1 IU/mL — ABNORMAL LOW (ref 0.30–0.70)

## 2016-10-26 LAB — GLUCOSE, CAPILLARY
Glucose-Capillary: 188 mg/dL — ABNORMAL HIGH (ref 65–99)
Glucose-Capillary: 225 mg/dL — ABNORMAL HIGH (ref 65–99)

## 2016-10-26 MED ORDER — ISOSORBIDE MONONITRATE ER 30 MG PO TB24: 30.0000 mg | ORAL_TABLET | Freq: Every day | ORAL | 5 refills | Status: DC

## 2016-10-26 MED ORDER — FUROSEMIDE 40 MG PO TABS: 40.0000 mg | ORAL_TABLET | Freq: Every day | ORAL | 5 refills | Status: DC | PRN

## 2016-10-26 MED ORDER — NICOTINE 21 MG/24HR TD PT24: 21.0000 mg | MEDICATED_PATCH | Freq: Every day | TRANSDERMAL | 0 refills | Status: DC

## 2016-10-26 MED ORDER — NITROGLYCERIN 0.4 MG SL SUBL: SUBLINGUAL_TABLET | SUBLINGUAL | 2 refills | Status: DC

## 2016-10-26 MED ORDER — FUROSEMIDE 40 MG PO TABS: 40.0000 mg | ORAL_TABLET | Freq: Every day | ORAL | Status: DC | PRN

## 2016-10-26 MED ORDER — CLOPIDOGREL BISULFATE 75 MG PO TABS: 75.0000 mg | ORAL_TABLET | Freq: Every day | ORAL | 11 refills | Status: DC

## 2016-10-26 MED ORDER — ATORVASTATIN CALCIUM 80 MG PO TABS: 80.0000 mg | ORAL_TABLET | Freq: Every day | ORAL | 5 refills | Status: DC

## 2016-10-26 MED ORDER — ASPIRIN 81 MG PO TABS: 81.0000 mg | ORAL_TABLET | Freq: Every day | ORAL | Status: DC

## 2016-10-26 NOTE — Progress Notes (Signed)
Progress Note  Patient Name: Jason [name] Date of Encounter: 10/26/2016  Primary Cardiologist: Claiborne Billings  Subjective   No events overnight. Chest pain improved. Ambulated with cardiac rehab. Off IV meds. Echo pending. 2L negative on lasix 40 mg daily.   Inpatient Medications    Scheduled Meds: . aspirin EC  81 mg Oral Daily  . atorvastatin  80 mg Oral q1800  . clopidogrel  75 mg Oral Daily  . furosemide  40 mg Oral Daily  . insulin aspart  0-9 Units Subcutaneous TID WC  . isosorbide mononitrate  30 mg Oral Daily  . levothyroxine  50 mcg Oral QAC breakfast  . loratadine  10 mg Oral Daily  . metoprolol succinate  100 mg Oral Daily  . nicotine  21 mg Transdermal Daily  . ranolazine  1,000 mg Oral BID  . sertraline  50 mg Oral Daily  . sodium chloride flush  3 mL Intravenous Q12H  . sodium chloride flush  3 mL Intravenous Q12H   Continuous Infusions: . sodium chloride 250 mL (10/23/16 1002)  . sodium chloride     PRN Meds: sodium chloride, sodium chloride, acetaminophen, ALPRAZolam, morphine injection, nitroGLYCERIN, ondansetron (ZOFRAN) IV, sodium chloride flush, sodium chloride flush   Vital Signs    Vitals:   10/26/16 0055 10/26/16 0335 10/26/16 0606 10/26/16 0800  BP: 121/73  130/67   Pulse: 77  87   Resp: (!) 27  19   Temp: 97.5 F (36.4 C) 98 F (36.7 C) 98 F (36.7 C) 97.5 F (36.4 C)  TempSrc: Oral Oral Oral Oral  SpO2: 97%  99%   Weight:   166 lb 11.2 oz (75.6 kg)     Intake/Output Summary (Last 24 hours) at 10/26/16 1217 Last data filed at 10/26/16 0800  Gross per 24 hour  Intake             1320 ml  Output             3175 ml  Net            -1855 ml   Filed Weights   10/24/16 0500 10/25/16 0620 10/26/16 0606  Weight: 163 lb 11.2 oz (74.3 kg) 169 lb 3.2 oz (76.7 kg) 166 lb 11.2 oz (75.6 kg)    Telemetry    sinus rhythm- Personally Reviewed  ECG    None today  Physical Exam   General appearance: alert and no distress Neck: no  carotid bruit and no JVD Lungs: clear to auscultation bilaterally Heart: regular rate and rhythm Abdomen: soft, non-tender; bowel sounds normal; no masses,  no organomegaly Extremities: extremities normal, atraumatic, no cyanosis or edema Pulses: 2+ and symmetric Skin: Skin color, texture, turgor normal. No rashes or lesions Neurologic: Grossly normal Psych: Pleasant   Labs    Chemistry  Recent Labs Lab 10/23/16 0528 10/23/16 1555 10/24/16 0224 10/25/16 0300  NA 131*  --  130* 127*  K 4.0  --  4.5 3.8  CL 100*  --  99* 92*  CO2 20*  --  22 22  GLUCOSE 146*  --  192* 170*  BUN 12  --  7 11  CREATININE 1.13 1.15 1.02 1.34*  CALCIUM 9.2  --  9.2 8.7*  GFRNONAA >60 >60 >60 54*  GFRAA >60 >60 >60 >60  ANIONGAP 11  --  9 13     Hematology  Recent Labs Lab 10/24/16 0224 10/25/16 0300 10/26/16 0158  WBC 12.3* 11.3* 8.2  RBC 3.92*  3.32* 3.37*  HGB 13.1 10.8* 11.0*  HCT 38.4* 32.3* 32.8*  MCV 98.0 97.3 97.3  MCH 33.4 32.5 32.6  MCHC 34.1 33.4 33.5  RDW 12.9 12.8 12.7  PLT 218 183 175    Cardiac Enzymes  Recent Labs Lab 10/23/16 1257 10/23/16 1555 10/23/16 2236 10/24/16 0224  TROPONINI 1.97* 2.88* 13.55* 16.67*     Recent Labs Lab 10/22/16 1523  TROPIPOC 0.45*     BNP  Recent Labs Lab 10/22/16 1509  BNP 272.8*     DDimer No results for input(s): DDIMER in the last 168 hours.   Radiology    No results found.  Cardiac Studies   Cardiac cath 10/23/16: Conclusion     Prox RCA to Mid RCA lesion, 100 %stenosed.  Ost LAD to Dist LAD lesion, 75 %stenosed.  1st Diag lesion, 50 %stenosed.  Ost 1st Mrg to 1st Mrg lesion, 100 %stenosed.  Ost Cx to Prox Cx lesion, 99 %stenosed.  Ost 2nd Mrg to 2nd Mrg lesion, 100 %stenosed.  SVG.  Origin lesion, 100 %stenosed.  Seq SVG-.  LIMA graft was visualized by angiography and is normal in caliber and anatomically normal.  LV end diastolic pressure is moderately elevated.  A STENT RESOLUTE  ONYX 4.5X18 drug eluting stent was successfully placed.  Dist Graft to Insertion lesion, 95 %stenosed.  Post intervention, there is a 0% residual stenosis.   1. Severe 3 vessel obstructive CAD. 2. Patent LIMA to the LAD 3. Patent SVG sequentially to OM1 and OM2 but with critical thrombotic lesion in the body of the SVG 4. Occluded SVG to the RCA- chronic 5. Moderately elevated LVEDP 6. Successful stenting of SVG to OM 2. Procedure complicated by no reflow phenomena and occlusion of the first OM.      Patient Profile     66 y.o. male with ongoing tobacco abuse, CAD s/p CABG admitted with unstable angina/NSTEMI  Assessment & Plan    1. CAD s/p CABG/NSTEMI: Pt admitted with chest pain, NSTEMI. Cardiac cath 10/23/16 per Dr. Martinique with severe stenosis in the vein graft sequential to OM1 and OM2. This was treated with a drug eluting stent but flow lost into the small first OM branch. The vein graft to the RCA is occluded chronically. Patent LIMA to LAD. He has had severe chest pain overnight. He has received narcotics for pain.  -Continue ASA, Plavix.  -Increase Lipitor to 80 mg daily -No good options to open the occluded OM branch -Chest pain improved  2. Acute diastolic CHF: Normal LV systolic function by echo 09/12/16. He is now infarcting his lateral wall due to acute occlusion of the first OM branch. LVEDP is elevated by cath. He received one dose of IV Lasix this am. Will start Lasix 40 mg IV BID. Diuresed about 4L negative. Appears euvolemic on exam. Will use lasix PRN - echo not yet performed. Would obtain echo as an outpatient.  3. Tobacco abuse: Smoking cessation is recommended.   [place] for d/c home today. Will need follow-up BMET and outpatient echo after discharge. Recommend 7 day TOC follow-up, then ultimately with [name].  Pixie Casino, MD, [place]  Attending Cardiologist  Direct Dial: [phone]  Fax: [phone]  Website:   [email or web address]  Pixie Casino, MD  10/26/2016, 12:17 PM

## 2016-10-26 NOTE — Discharge Summary (Signed)
Discharge Summary    Patient ID: Jason [name],  MRN: [id number], DOB/AGE: October 05, 1950 66 y.o.  Admit date: 10/22/2016 Discharge date: 10/26/2016  Primary Care Provider: Patient, [name] Primary Cardiologist: [name]  Discharge Diagnoses    Principal Problem:   NSTEMI (non-ST elevated myocardial infarction) [facility]) Active Problems:   DM (diabetes mellitus) ([place])   HTN (hypertension)   Hyperlipidemia   CAD, CABG Feb 2011, cath x 4 since-medical Rx   PVD, hx Rt femoral endarterectomy 2004   Tobacco abuse   Acute on chronic combined systolic and diastolic CHF (congestive heart failure) ([facility])   Allergies Allergies  Allergen Reactions  . Coreg [Carvedilol] Nausea And Vomiting and Other (See Comments)    Per patient made him dizzy and light sensitive  . Sulfa Antibiotics Nausea And Vomiting and Other (See Comments)    Also headaches     Diagnostic Studies/Procedures    Procedures   Coronary Stent Intervention  Left Heart Cath and Cors/Grafts Angiography  Conclusion     Prox RCA to Mid RCA lesion, 100 %stenosed.  Ost LAD to Dist LAD lesion, 75 %stenosed.  1st Diag lesion, 50 %stenosed.  Ost 1st Mrg to 1st Mrg lesion, 100 %stenosed.  Ost Cx to Prox Cx lesion, 99 %stenosed.  Ost 2nd Mrg to 2nd Mrg lesion, 100 %stenosed.  SVG.  Origin lesion, 100 %stenosed.  Seq SVG-.  LIMA graft was visualized by angiography and is normal in caliber and anatomically normal.  LV end diastolic pressure is moderately elevated.  A STENT RESOLUTE ONYX 4.5X18 drug eluting stent was successfully placed.  Dist Graft to Insertion lesion, 95 %stenosed.  Post intervention, there is a 0% residual stenosis.   1. Severe 3 vessel obstructive CAD. 2. Patent LIMA to the LAD 3. Patent SVG sequentially to OM1 and OM2 but with critical thrombotic lesion in the body of the SVG 4. Occluded SVG to the RCA- chronic 5. Moderately elevated LVEDP 6. Successful stenting of SVG to OM  2. Procedure complicated by no reflow phenomena and occlusion of the first OM.        History of Present Illness     This is a 66 y.o. male, followed by [name], with PMHx of bilateral carotid artery stenosis s/p stenting (03/2015), ischemic heart disease s/p CABG (2001) with multiple stents prior, ongoing tobacco use of 1 PPD, DM, HTN, HLD, hypothyroidism who presented from urgent care on 10/22/16 with CP and ruled in for NSTEMI.   Hospital Course     Pt was admitted to stepdown. Troponins peaked to 16.67. He was placed on IV heparin. Cardiac cath 10/23/16 per Dr. Martinique with severe stenosis in the vein graft sequential to OM1 and OM2. This was treated with a drug eluting stent but flow lost into the small first OM branch. The vein graft to the RCA is occluded chronically. Patent LIMA to LAD. No good options to open the occluded OM branch. Medical therapy elected for the OM1. He was continued on ASA and Plavix. Lipitor was increased to 80 mg. He was also treated with IV lasix for acute on chronic diastolic  HF. He was diuresed back to euvolemic state, -4L out. Pt advised to use PO lasix  PRN at home.   On 10/26/16, he was seen by Dr. Debara Pickett and was stable w/o CP. No post cath complications. VSS. Dr. Debara Pickett determined he was stable for discharge home. Smoking cessation was strongly advised. He will need an outpatient 2D  echo and office f/u in 7 days (TOC), with a repeat BMP.    Consultants: none    Discharge Vitals Blood pressure 106/84, pulse 91, temperature 99 F (37.2 C), temperature source Oral, resp. rate (!) 23, weight 166 lb 11.2 oz (75.6 kg), SpO2 98 %.  Filed Weights   10/24/16 0500 10/25/16 0620 10/26/16 0606  Weight: 163 lb 11.2 oz (74.3 kg) 169 lb 3.2 oz (76.7 kg) 166 lb 11.2 oz (75.6 kg)    Labs & Radiologic Studies    CBC  Recent Labs  10/25/16 0300 10/26/16 0158  WBC 11.3* 8.2  HGB 10.8* 11.0*  HCT 32.3* 32.8*  MCV 97.3 97.3  PLT 183 546   Basic Metabolic  Panel  Recent Labs  10/24/16 0224 10/25/16 0300  NA 130* 127*  K 4.5 3.8  CL 99* 92*  CO2 22 22  GLUCOSE 192* 170*  BUN 7 11  CREATININE 1.02 1.34*  CALCIUM 9.2 8.7*   Liver Function Tests No results for input(s): AST, ALT, ALKPHOS, BILITOT, PROT, ALBUMIN in the last 72 hours. No results for input(s): LIPASE, AMYLASE in the last 72 hours. Cardiac Enzymes  Recent Labs  10/23/16 1555 10/23/16 2236 10/24/16 0224  TROPONINI 2.88* 13.55* 16.67*   BNP Invalid input(s): POCBNP D-Dimer No results for input(s): DDIMER in the last 72 hours. Hemoglobin A1C No results for input(s): HGBA1C in the last 72 hours. Fasting Lipid Panel No results for input(s): CHOL, HDL, LDLCALC, TRIG, CHOLHDL, LDLDIRECT in the last 72 hours. Thyroid Function Tests No results for input(s): TSH, T4TOTAL, T3FREE, THYROIDAB in the last 72 hours.  Invalid input(s): FREET3 _____________  Dg Chest 2 View  Result Date: 10/22/2016 CLINICAL DATA:  66 y/o  M; chest pain. EXAM: CHEST  2 VIEW COMPARISON:  10/22/2016 chest radiograph. FINDINGS: Stable normal cardiac silhouette. Aortic atherosclerosis with calcification. Clear lungs. No pleural effusion or pneumothorax. Post median sternotomy with wires and alignment. No acute osseous abnormality is evident. Upper lumbar spine degenerative changes. IMPRESSION: No acute pulmonary process identified. Electronically Signed   By: Kristine Garbe M.D.   On: 10/22/2016 16:03   Dg Chest 2 View  Result Date: 10/22/2016 CLINICAL DATA:  Chest pain, cough. EXAM: CHEST  2 VIEW COMPARISON:  Radiographs of September 14, 2015. FINDINGS: The heart size and mediastinal contours are within normal limits. Both lungs are clear. Status post coronary artery bypass graft. No pneumothorax or pleural effusion is noted. The visualized skeletal structures are unremarkable. Atherosclerosis of thoracic aorta is noted. IMPRESSION: No active cardiopulmonary disease. Electronically Signed   By:  Marijo Conception, M.D.   On: 10/22/2016 14:03   Disposition   Pt is being discharged home today in good condition.  Follow-up Plans & Appointments    Follow-up Information    Troy Sine, MD Follow up.   Specialty:  Cardiology Why:  our office will call you with a follow-up appointment in 1 week, they will also call you with an appointment for your heart ultrasound  Contact information: [address] [place] [address] [phone]          Discharge Instructions    AMB Referral to Cardiac Rehabilitation - Phase II    Complete by:  As directed    Diagnosis:  NSTEMI   Amb Referral to Cardiac Rehabilitation    Complete by:  As directed    Diagnosis:  NSTEMI   Diet - low sodium heart healthy    Complete by:  As  directed    Increase activity slowly    Complete by:  As directed       Discharge Medications   Current Discharge Medication List    START taking these medications   Details  atorvastatin (LIPITOR) 80 MG tablet Take 1 tablet (80 mg total) by mouth daily at 6 PM. Qty: 30 tablet, Refills: 5    furosemide (LASIX) 40 MG tablet Take 1 tablet (40 mg total) by mouth daily as needed for edema. Qty: 30 tablet, Refills: 5    nicotine (NICODERM CQ - DOSED IN MG/24 HOURS) 21 mg/24hr patch Place 1 patch (21 mg total) onto the skin daily. Qty: 28 patch, Refills: 0      CONTINUE these medications which have CHANGED   Details  aspirin 81 MG tablet Take 1 tablet (81 mg total) by mouth daily.    clopidogrel (PLAVIX) 75 MG tablet Take 1 tablet (75 mg total) by mouth daily. Qty: 30 tablet, Refills: 11    isosorbide mononitrate (IMDUR) 30 MG 24 hr tablet Take 1 tablet (30 mg total) by mouth daily. Qty: 30 tablet, Refills: 5    nitroGLYCERIN (NITROSTAT) 0.4 MG SL tablet PLACE 1 TABLET (0.4 MG TOTAL) UNDER THE TONGUE EVERY 5 (FIVE) MINUTES AS NEEDED FOR CHEST PAIN. Qty: 25 tablet, Refills: 2      CONTINUE these medications which have NOT CHANGED    Details  acetaminophen (TYLENOL) 325 MG tablet Take 2 tablets (650 mg total) by mouth every 4 (four) hours as needed for headache or mild pain.    ALPRAZolam (XANAX) 0.5 MG tablet TAKE 1/2 - 1 TABLET BY MOUTH 2 TIMES DAILY AS NEEDED FOR ANXIETY OR PANIC EPISODE Qty: 30 tablet, Refills: 0   Associated Diagnoses: Anxiety and depression    levothyroxine (SYNTHROID, LEVOTHROID) 50 MCG tablet TAKE 1 TABLET BY MOUTH EVERY DAY Qty: 30 tablet, Refills: 0    loratadine (CLARITIN) 10 MG tablet Take 10 mg by mouth daily.    metFORMIN (GLUCOPHAGE) 500 MG tablet TAKE 1 TABLET BY MOUTH TWICE A DAY WITH A MEAL Qty: 60 tablet, Refills: 0    metoprolol succinate (TOPROL-XL) 100 MG 24 hr tablet TAKE 1 TABLET BY MOUTH EVERY DAY TAKE WITH OR IMMEDIATELY FOLLOWING A MEAL Qty: 30 tablet, Refills: 6    ranitidine (ZANTAC) 150 MG tablet Take 150 mg by mouth daily as needed for heartburn. For acid reflux    ranolazine (RANEXA) 1000 MG SR tablet Take 1 tablet (1,000 mg total) by mouth 2 (two) times daily. Qty: 60 tablet, Refills: 6    sertraline (ZOLOFT) 50 MG tablet TAKE 1 TABLET BY MOUTH EVERY DAY (MUST HAVE OFFICE VISIT FOR FURTHER REFILLS) Qty: 30 tablet, Refills: 0      STOP taking these medications     naproxen sodium (ANAPROX) 220 MG tablet          Aspirin prescribed at discharge?  Yes High Intensity Statin Prescribed? (Lipitor 40-80mg  or Crestor 20-40mg ): Yes Beta Blocker Prescribed? Yes For EF <40%, was ACEI/ARB Prescribed? Yes ADP Receptor Inhibitor Prescribed? (i.e. Plavix etc.-Includes Medically Managed Patients): Yes For EF <40%, Aldosterone Inhibitor Prescribed? No: Was EF assessed during THIS hospitalization? No:  Was Cardiac Rehab II ordered? (Included Medically managed Patients): Yes   Outstanding Labs/Studies   BMP in 7 days. ] Outpatient 2D Echo   Duration of Discharge Encounter   Greater than 30 minutes including physician time.  Signed, Lyda Jester  PA-C 10/26/2016, 1:29 PM

## 2016-10-27 ENCOUNTER — Other Ambulatory Visit: Payer: Self-pay | Admitting: Urgent Care

## 2016-10-27 DIAGNOSIS — F419 Anxiety disorder, unspecified: Principal | ICD-10-CM

## 2016-10-27 DIAGNOSIS — F329 Major depressive disorder, single episode, unspecified: Secondary | ICD-10-CM

## 2016-10-27 MED ORDER — SERTRALINE HCL 50 MG PO TABS: ORAL_TABLET | ORAL | 0 refills | Status: DC

## 2016-10-27 NOTE — Telephone Encounter (Signed)
Pt states has been out since appt. And that's why he came in for refills

## 2016-10-27 NOTE — Telephone Encounter (Signed)
Patient needs an office visit. He came in to see me for chest pain. I evaluated him for that and had to send him to the hospital for an NSTEMI. He can schedule with any provider. I did give a courtesy refill for his Zoloft. But he needs to be seen for a controlled substance refill. Thank you.

## 2016-10-27 NOTE — Telephone Encounter (Signed)
l/m with manis note

## 2016-10-27 NOTE — Telephone Encounter (Signed)
Pt states that he is out of his Alprazolam and Sertraline. He was last seen on 10/22/16 for the med refill but he was sent out on EMS.   Please adv.

## 2016-10-28 ENCOUNTER — Telehealth: Payer: Self-pay | Admitting: Cardiovascular Disease

## 2016-10-28 NOTE — Telephone Encounter (Signed)
Paperwork completed and signed by Dr Claiborne Billings.

## 2016-10-28 NOTE — Telephone Encounter (Signed)
Patient walked in requesting Ranexa 1000mg  samples. Notified patient that none are available. He has Rx but the medication is >$100/week. Provided patient w/patient assistance paperwork and provided Mariann Laster, CMA with MD portion to be completed. Patient will bring form in to office for [place] to submit on his behalf.

## 2016-11-09 ENCOUNTER — Other Ambulatory Visit: Payer: Self-pay | Admitting: Physician Assistant

## 2016-11-14 ENCOUNTER — Telehealth: Payer: Self-pay | Admitting: Cardiovascular Disease

## 2016-11-14 NOTE — Telephone Encounter (Signed)
New message    Patient calling the office for samples of medication:   1.  What medication and dosage are you requesting samples for? ranexa 1000 mg  2.  Are you currently out of this medication? No, pt has a weeks worth

## 2016-11-14 NOTE — Telephone Encounter (Signed)
New message     Pt returned call and scheduled his 7 day TOC appt needed for post hospital f/u s/p NSTEMI with APP on Dr. Evette Georges team.

## 2016-11-14 NOTE — Telephone Encounter (Signed)
Patient discharged 10/26/16 (which is past TOC outreach window). Encounter closed.

## 2016-11-14 NOTE — Telephone Encounter (Signed)
LM that no ranexa samples are available. Can call back if need Rx

## 2016-11-24 ENCOUNTER — Other Ambulatory Visit: Payer: Self-pay | Admitting: Urgent Care

## 2016-11-25 NOTE — Progress Notes (Signed)
Cardiology Office Note    Date:  11/26/2016   ID:  Jason [name], DOB 1951-01-17, MRN [id number]  PCP:  Patient, [name]  Cardiologist: [name]   Chief Complaint  Patient presents with  . Hospitalization Follow-up    History of Present Illness:    Jason [name] is a 66 y.o. male with past medical history of CAD (s/p CABG in 2011 with LIMA-LAD, SVG-RI/OM, and SVG-PDA, occluded SVG-PDA by cath in 2015), PVD, carotid artery disease,HTN, HLD, [place] 2 DM, and tobacco use who presents to the office today for hospital follow-up.   He was recently admitted from 5/2 - 10/26/2016 for evaluation of chest pain. His EKG showed more siginificant ST depression along V3-V6 and troponin values were found to be elevated to 16.67, therefore he was taken for a repeat catheterization on 10/23/2016 which showed severe 3-vessel CAD with patent LIMA-LAD, known occlusion of SVG-RCA but with critical thrombus along the distal graft to insertion lesion of the SVG-OM2. This was treated with placement of a Resolute DES. The procedure was complicated by no reflow and occlusion of the OM1. He continued to have chest pain after the procedure which was treated with IV NTG and analgesics but improved throughout the hospitalization. He was discharged on ASA, Plavix, Lipitor 80mg  daily, Imdur 30mg  daily, Toprol-XL 100mg  daily, and Lasix 40mg  PRN for edema.   In talking with the patient today, he reports overall doing well since his recent hospitalization. He denies any repeat episodes of chest discomfort or dyspnea on exertion. No orthopnea, PND, or lower extremity edema.  He reports good compliance with his medication regimen including ASA and Plavix. Reports his medications are extensive as he does not currently have insurance coverage. He requests samples for Ranexa today this is significantly helps with his chest discomfort but the out-of-pocket cost is over $100 per week.  He has reduced his cigarette use from 1  ppd to 1-2 cigarettes per day.    Past Medical History:  Diagnosis Date  . Anxiety    off xanax  and paxil since 3/13  . Arthritis   . Bilateral carotid artery disease ([place])    s/p R ICA stent 03/28/2015 with distal protection. Known chronically occluded L ICA. Carotid stent complicated by hypotension and acute stroke in [place]  . Cancer ([place])    tumor basal cell rem from lft arm  . Cataract   . COPD (chronic obstructive pulmonary disease) ([place])   . Coronary artery disease    a. s/p CABG in 2011 with LIMA-LAD, SVG-RI/OM, and SVG-PDA b. occluded SVG-PDA by cath in 2015 c. 10/2016: NSTEMI with cath showing thrombus along the distal graft to insertion of SVG-OM2 with DES placed.   . CVA (cerebral infarction)    occured on 10/10 several days after R ICA carotid stenting  . Depression   . Diabetes mellitus   . GERD (gastroesophageal reflux disease)   . Heart attack ([place])   . High cholesterol   . Hypertension    type 2 NIDDM  . Myocardial infarct (HCC)    x4 last 10 yrs  . Pneumonia    hx  . 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 Smoker   . Substance abuse     Past Surgical History:  Procedure Laterality Date  . BACK SURGERY  Jan 2014, Dec 2011   Dr Vertell Limber  . CARDIAC CATHETERIZATION  4/12   Medical Rx  . CORONARY  ANGIOGRAM  01/17/14   med rx  . CORONARY ANGIOGRAM  4/13   med Rx  . CORONARY ANGIOGRAM  1/15   Med Rx  . CORONARY ANGIOPLASTY  Jan 2004   RCA  . CORONARY ARTERY BYPASS GRAFT  07/24/2009   L-LAD, SVG-RI/OM, SVG-PDA  . CORONARY STENT INTERVENTION N/A 10/23/2016   Procedure: Coronary Stent Intervention;  Surgeon: Peter M Martinique, MD;  Location: [facility];  Service: Cardiovascular;  Laterality: N/A;  . EYE SURGERY     cat bil  . FEMORAL ARTERY - FEMORAL ARTERY BYPASS GRAFT Right 2004   femoral enarterectomy  . LEFT HEART CATH AND CORS/GRAFTS ANGIOGRAPHY N/A 10/23/2016   Procedure: Left Heart Cath and Cors/Grafts  Angiography;  Surgeon: Peter M Martinique, MD;  Location: [facility];  Service: Cardiovascular;  Laterality: N/A;  . LEFT HEART CATHETERIZATION WITH CORONARY ANGIOGRAM N/A 10/03/2011   Procedure: LEFT HEART CATHETERIZATION WITH CORONARY ANGIOGRAM;  Surgeon: Pixie Casino, MD;  Location: [facility] C[facility] LAB;  Service: Cardiovascular;  Laterality: N/A;  . LEFT HEART CATHETERIZATION WITH CORONARY ANGIOGRAM N/A 07/08/2013   Procedure: LEFT HEART CATHETERIZATION WITH CORONARY ANGIOGRAM;  Surgeon: Blane Ohara, MD;  Location: [facility] C[facility] LAB;  Service: Cardiovascular;  Laterality: N/A;  . LEFT HEART CATHETERIZATION WITH CORONARY/GRAFT ANGIOGRAM N/A 01/17/2014   Procedure: LEFT HEART CATHETERIZATION WITH Beatrix Fetters;  Surgeon: Troy Sine, MD;  Location: [facility] C[facility] LAB;  Service: Cardiovascular;  Laterality: N/A;  . PERIPHERAL VASCULAR CATHETERIZATION N/A 03/28/2015   Procedure: Carotid PTA/Stent Intervention;  Surgeon: Lorretta Harp, MD;  Location: [facility];  Service: Cardiovascular;  Laterality: N/A;  . US EXTREMITY*L*     lft arm tumor removed     Current Medications: Outpatient Medications Prior to Visit  Medication Sig Dispense Refill  . acetaminophen (TYLENOL) 325 MG tablet Take 2 tablets (650 mg total) by mouth every 4 (four) hours as needed for headache or mild pain.    Marland Kitchen aspirin 81 MG tablet Take 1 tablet (81 mg total) by mouth daily.    Marland Kitchen atorvastatin (LIPITOR) 80 MG tablet Take 1 tablet (80 mg total) by mouth daily at 6 PM. 30 tablet 5  . clopidogrel (PLAVIX) 75 MG tablet Take 1 tablet (75 mg total) by mouth daily. 30 tablet 11  . isosorbide mononitrate (IMDUR) 30 MG 24 hr tablet Take 1 tablet (30 mg total) by mouth daily. 30 tablet 5  . levothyroxine (SYNTHROID, LEVOTHROID) 50 MCG tablet TAKE 1 TABLET BY MOUTH EVERY DAY 30 tablet 0  . loratadine (CLARITIN) 10 MG tablet Take 10 mg by mouth daily.    . metFORMIN (GLUCOPHAGE) 500 MG tablet TAKE 1 TABLET BY MOUTH TWICE A  DAY WITH A MEAL 60 tablet 0  . metoprolol succinate (TOPROL-XL) 100 MG 24 hr tablet TAKE 1 TABLET BY MOUTH EVERY DAY TAKE WITH OR IMMEDIATELY FOLLOWING A MEAL 30 tablet 6  . nicotine (NICODERM CQ - DOSED IN MG/24 HOURS) 21 mg/24hr patch Place 1 patch (21 mg total) onto the skin daily. 28 patch 0  . nitroGLYCERIN (NITROSTAT) 0.4 MG SL tablet PLACE 1 TABLET (0.4 MG TOTAL) UNDER THE TONGUE EVERY 5 (FIVE) MINUTES AS NEEDED FOR CHEST PAIN. 25 tablet 2  . ranitidine (ZANTAC) 150 MG tablet Take 150 mg by mouth daily as needed for heartburn. For acid reflux    . ranolazine (RANEXA) 1000 MG SR tablet Take 1 tablet (1,000 mg total) by mouth 2 (two) times daily. 60 tablet  6  . sertraline (ZOLOFT) 50 MG tablet Take 1 tablet (50 mg total) by mouth daily. TAKE 1 TABLET BY MOUTH EVERY DAY 30 tablet 1  . ALPRAZolam (XANAX) 0.5 MG tablet TAKE 1/2 - 1 TABLET BY MOUTH 2 TIMES DAILY AS NEEDED FOR ANXIETY OR PANIC EPISODE (Patient not taking: Reported on 11/26/2016) 30 tablet 0  . furosemide (LASIX) 40 MG tablet Take 1 tablet (40 mg total) by mouth daily as needed for edema. (Patient not taking: Reported on 11/26/2016) 30 tablet 5   No facility-administered medications prior to visit.      Allergies:   Coreg [carvedilol] and Sulfa antibiotics   Social History   Social History  . Marital status: Single    Spouse name: N/A  . Number of children: 1  . Years of education: 23   Occupational History  . welder-fabricator    Social History Main Topics  . Smoking status: Current Every Day Smoker    Packs/day: 1.00    Years: 48.00    Types: Cigarettes  . Smokeless tobacco: Never Used  . Alcohol use 0.0 oz/week     Comment: socially  . Drug use: No     Comment: hx of cocaine use  . Sexual activity: Not Currently   Other Topics Concern  . None   Social History Narrative   Lives alone   Caffeine use: Drinks pepsi/coffee (32 oz diet pepsi per day)    Exercise walking 4 times per week for 1 mile     Family  History:  The patient's family history includes Arthritis in his mother; Heart disease in his father.   Review of Systems:   Please see the history of present illness.     General:  No chills, fever, night sweats or weight changes.  Cardiovascular:  No dyspnea on exertion, edema, orthopnea, palpitations, paroxysmal nocturnal dyspnea. Positive for chest pain.  Dermatological: No rash, lesions/masses Respiratory: No cough, dyspnea Urologic: No hematuria, dysuria Abdominal:   No nausea, vomiting, diarrhea, bright red blood per rectum, melena, or hematemesis Neurologic:  No visual changes, wkns, changes in mental status. All other systems reviewed and are otherwise negative except as noted above.   Physical Exam:    VS:  BP 110/64 (BP Location: Right Arm, Patient Position: Sitting, Cuff Size: Normal)   Pulse 80   Ht 5\' 9"  (1.753 m)   Wt 166 lb 9.6 oz (75.6 kg)   BMI 24.60 kg/m    General: Well developed, well nourished Caucasian male appearing in no acute distress. Head: Normocephalic, atraumatic, sclera non-icteric, no xanthomas, nares are without discharge.  Neck: No carotid bruits. JVD not elevated.  Lungs: Respirations regular and unlabored, without wheezes or rales.  Heart: Regular rate and rhythm. No S3 or S4.  No murmur, no rubs, or gallops appreciated. Abdomen: Soft, non-tender, non-distended with normoactive bowel sounds. No hepatomegaly. No rebound/guarding. No obvious abdominal masses. Msk:  Strength and tone appear normal for age. No joint deformities or effusions. Extremities: No clubbing or cyanosis. No lower extremity edema.  Distal pedal pulses are 2+ bilaterally. Radial cath site is stable with no evidence of ecchymosis or a hematoma.  Neuro: Alert and oriented X 3. Moves all extremities spontaneously. No focal deficits noted. Psych:  Responds to questions appropriately with a normal affect. Skin: No rashes or lesions noted  Wt Readings from Last 3 Encounters:    11/26/16 166 lb 9.6 oz (75.6 kg)  10/26/16 166 lb 11.2 oz (75.6 kg)  10/22/16 169  lb (76.7 kg)     Studies/Labs Reviewed:   EKG:  EKG is ordered today.  The ekg ordered today demonstrates NSR, with known RBBB. Occasional PVC's.   Recent Labs: 10/22/2016: B Natriuretic Peptide 272.8 10/25/2016: BUN 11; Creatinine, Ser 1.34; Potassium 3.8; Sodium 127 10/26/2016: Hemoglobin 11.0; Platelets 175   Lipid Panel    Component Value Date/Time   CHOL 219 (H) 10/23/2016 0528   TRIG 509 (H) 10/23/2016 0528   HDL 36 (L) 10/23/2016 0528   CHOLHDL 6.1 10/23/2016 0528   VLDL UNABLE TO CALCULATE IF TRIGLYCERIDE OVER 400 mg/dL 10/23/2016 0528   LDLCALC UNABLE TO CALCULATE IF TRIGLYCERIDE OVER 400 mg/dL 10/23/2016 0528    Additional studies/ records that were reviewed today include:   Cardiac Catheterization: 10/23/2016  Prox RCA to Mid RCA lesion, 100 %stenosed.  Ost LAD to Dist LAD lesion, 75 %stenosed.  1st Diag lesion, 50 %stenosed.  Ost 1st Mrg to 1st Mrg lesion, 100 %stenosed.  Ost Cx to Prox Cx lesion, 99 %stenosed.  Ost 2nd Mrg to 2nd Mrg lesion, 100 %stenosed.  SVG.  Origin lesion, 100 %stenosed.  Seq SVG-.  LIMA graft was visualized by angiography and is normal in caliber and anatomically normal.  LV end diastolic pressure is moderately elevated.  A STENT RESOLUTE ONYX 4.5X18 drug eluting stent was successfully placed.  Dist Graft to Insertion lesion, 95 %stenosed.  Post intervention, there is a 0% residual stenosis.   1. Severe 3 vessel obstructive CAD. 2. Patent LIMA to the LAD 3. Patent SVG sequentially to OM1 and OM2 but with critical thrombotic lesion in the body of the SVG 4. Occluded SVG to the RCA- chronic 5. Moderately elevated LVEDP 6. Successful stenting of SVG to OM 2. Procedure complicated by no reflow phenomena and occlusion of the first OM.   Plan: IV Ntg overnight. Analgesics. Check Echo tomorrow. Serial troponins. Continue DAPT indefinitely.    Assessment:    1. Coronary artery disease of native artery of native heart with stable angina pectoris ([place])   2. Non-ST elevation myocardial infarction (NSTEMI), subsequent episode of care ([place])   3. Medication management   4. Essential hypertension   5. Hyperlipidemia with target LDL less than 70   6. CKD (chronic kidney disease) stage 3, GFR 30-59 ml/min   7. Tobacco use   8. Carotid stenosis, bilateral      Plan:   In order of problems listed above:  1. CAD/ Subsequent Episode of Care for NSTEMI - s/p CABG in 2011 with LIMA-LAD, SVG-RI/OM, and SVG-PDA, occluded SVG-PDA by cath in 2015. Recently admitted for an NSTEMI with cath showing severe 3-vessel CAD with patent LIMA-LAD, known occlusion of SVG-RCA but with critical thrombus along the distal graft to insertion lesion of the SVG-OM2. This was treated with placement of a Resolute DES. The procedure was complicated by no reflow and occlusion of the OM1.  - he will continue on DAPT with ASA and Plavix along with Lipitor 80mg  daily, Imdur 30mg  daily, Toprol-XL 100mg  daily, and Ranexa 1000mg  BID (provided with samples).   2. HTN - BP is well-controlled at 110/64 during today's visit.  - continue current medication regimen.   3. HLD - he remains on Atorvastatin 80mg  daily with goal LDL < 70 with known CAD.   4. Stage 3 CKD - creatinine was elevated to 1.34 at the time of his recent hospital discharge.  - will recheck BMET today.   5. Tobacco Use - he has reduced his cigarette  use from 1 ppd to 1-2 cigarettes per day. He was congratulated on this with complete cessation advised.   6. Carotid Stenosis - recent dopplers in 08/2016 showed 50-75% stenosis in the right ICA s/p stent, which has progressed from prior studies with known occlusion of the left ICA. - plan for repeat studies in 03/2017.   Medication Adjustments/Labs and Tests Ordered: Current medicines are reviewed at length with the patient today.  Concerns  regarding medicines are outlined above.  Medication changes, Labs and Tests ordered today are listed in the Patient Instructions below. Patient Instructions  Medication Instructions: Your physician recommends that you continue on your current medications as directed. Please refer to the Current Medication list given to you today.  If you need a refill on your cardiac medications before your next appointment, please call your pharmacy.   Labwork: Your physician recommends that you have a BMET drawn today.   Follow-Up: Your physician wants you to follow-up in: 3 months with [name].    Thank you for choosing Heartcare at [facility]!!     Signed, Erma Heritage, PA-C  11/26/2016 6:30 PM    [address], [place], [place]  [address] Phone: [phone]; Fax: [phone]  [address] [place], [place] [address] Phone: [phone]

## 2016-11-26 ENCOUNTER — Encounter: Payer: Self-pay | Admitting: Student

## 2016-11-26 ENCOUNTER — Ambulatory Visit (INDEPENDENT_AMBULATORY_CARE_PROVIDER_SITE_OTHER): Payer: Self-pay | Admitting: Student

## 2016-11-26 VITALS — BP 110/64 | HR 80 | Ht 69.0 in | Wt 166.6 lb

## 2016-11-26 DIAGNOSIS — I10 Essential (primary) hypertension: Secondary | ICD-10-CM

## 2016-11-26 DIAGNOSIS — Z79899 Other long term (current) drug therapy: Secondary | ICD-10-CM

## 2016-11-26 DIAGNOSIS — I6523 Occlusion and stenosis of bilateral carotid arteries: Secondary | ICD-10-CM

## 2016-11-26 DIAGNOSIS — Z720 Tobacco use: Secondary | ICD-10-CM

## 2016-11-26 DIAGNOSIS — I25118 Atherosclerotic heart disease of native coronary artery with other forms of angina pectoris: Secondary | ICD-10-CM

## 2016-11-26 DIAGNOSIS — N183 Chronic kidney disease, stage 3 unspecified: Secondary | ICD-10-CM

## 2016-11-26 DIAGNOSIS — E785 Hyperlipidemia, unspecified: Secondary | ICD-10-CM

## 2016-11-26 DIAGNOSIS — I214 Non-ST elevation (NSTEMI) myocardial infarction: Secondary | ICD-10-CM

## 2016-11-26 NOTE — Patient Instructions (Signed)
Medication Instructions: Your physician recommends that you continue on your current medications as directed. Please refer to the Current Medication list given to you today.  If you need a refill on your cardiac medications before your next appointment, please call your pharmacy.   Labwork: Your physician recommends that you have a BMET drawn today.   Follow-Up: Your physician wants you to follow-up in: 3 months with [name].    Thank you for choosing Heartcare at [facility]!!

## 2016-11-27 LAB — BASIC METABOLIC PANEL
BUN/Creatinine Ratio: 10 (ref 10–24)
BUN: 12 mg/dL (ref 8–27)
CO2: 21 mmol/L (ref 18–29)
Calcium: 10.1 mg/dL (ref 8.6–10.2)
Chloride: 95 mmol/L — ABNORMAL LOW (ref 96–106)
Creatinine, Ser: 1.18 mg/dL (ref 0.76–1.27)
GFR, EST AFRICAN AMERICAN: 75 mL/min/{1.73_m2} (ref >59)
GFR, EST NON AFRICAN AMERICAN: 65 mL/min/{1.73_m2} (ref >59)
Glucose: 252 mg/dL — ABNORMAL HIGH (ref 65–99)
POTASSIUM: 5.3 mmol/L — AB (ref 3.5–5.2)
SODIUM: 133 mmol/L — AB (ref 134–144)

## 2016-12-05 ENCOUNTER — Telehealth: Payer: Self-pay | Admitting: Cardiology

## 2016-12-05 NOTE — Telephone Encounter (Signed)
Tried to leave a message for the patient to call and schedule his echo, but, machine would not take my message.

## 2016-12-09 ENCOUNTER — Encounter: Payer: Self-pay | Admitting: *Deleted

## 2016-12-23 ENCOUNTER — Ambulatory Visit (INDEPENDENT_AMBULATORY_CARE_PROVIDER_SITE_OTHER): Payer: Self-pay

## 2016-12-23 ENCOUNTER — Encounter: Payer: Self-pay | Admitting: Physician Assistant

## 2016-12-23 ENCOUNTER — Ambulatory Visit (INDEPENDENT_AMBULATORY_CARE_PROVIDER_SITE_OTHER): Payer: Self-pay | Admitting: Physician Assistant

## 2016-12-23 VITALS — BP 122/65 | HR 80 | Temp 97.7°F | Resp 17 | Ht 69.0 in | Wt 168.0 lb

## 2016-12-23 DIAGNOSIS — R05 Cough: Secondary | ICD-10-CM

## 2016-12-23 DIAGNOSIS — R059 Cough, unspecified: Secondary | ICD-10-CM

## 2016-12-23 DIAGNOSIS — E118 Type 2 diabetes mellitus with unspecified complications: Secondary | ICD-10-CM

## 2016-12-23 DIAGNOSIS — J441 Chronic obstructive pulmonary disease with (acute) exacerbation: Secondary | ICD-10-CM

## 2016-12-23 LAB — POCT CBC
GRANULOCYTE PERCENT: 66.8 % (ref 37–80)
HCT, POC: 38.4 % — AB (ref 43.5–53.7)
HEMOGLOBIN: 12.7 g/dL — AB (ref 14.1–18.1)
Lymph, poc: 2.2 (ref 0.6–3.4)
MCH: 32.1 pg — AB (ref 27–31.2)
MCHC: 33.1 g/dL (ref 31.8–35.4)
MCV: 96.7 fL (ref 80–97)
MID (CBC): 0.3 (ref 0–0.9)
MPV: 6 fL (ref 0–99.8)
POC GRANULOCYTE: 5.1 (ref 2–6.9)
POC LYMPH PERCENT: 29.6 %L (ref 10–50)
POC MID %: 3.6 % (ref 0–12)
Platelet Count, POC: 248 10*3/uL (ref 142–424)
RBC: 3.97 M/uL — AB (ref 4.69–6.13)
RDW, POC: 12.6 %
WBC: 7.6 10*3/uL (ref 4.6–10.2)

## 2016-12-23 LAB — GLUCOSE, POCT (MANUAL RESULT ENTRY): POC Glucose: 212 mg/dl — AB (ref 70–99)

## 2016-12-23 MED ORDER — SERTRALINE HCL 100 MG PO TABS: 100.0000 mg | ORAL_TABLET | Freq: Every day | ORAL | 1 refills | Status: DC

## 2016-12-23 MED ORDER — ALBUTEROL SULFATE HFA 108 (90 BASE) MCG/ACT IN AERS: 2.0000 | INHALATION_SPRAY | Freq: Four times a day (QID) | RESPIRATORY_TRACT | 0 refills | Status: DC | PRN

## 2016-12-23 MED ORDER — GLIMEPIRIDE 1 MG PO TABS: 1.0000 mg | ORAL_TABLET | Freq: Every day | ORAL | 0 refills | Status: DC

## 2016-12-23 MED ORDER — IPRATROPIUM BROMIDE 0.02 % IN SOLN
0.5000 mg | Freq: Once | RESPIRATORY_TRACT | Status: AC
  Administered 2016-12-23: 0.5 mg via RESPIRATORY_TRACT

## 2016-12-23 MED ORDER — METHYLPREDNISOLONE ACETATE 80 MG/ML IJ SUSP
80.0000 mg | Freq: Once | INTRAMUSCULAR | Status: AC
Start: 2016-12-23 — End: 2016-12-23
  Administered 2016-12-23: 80 mg via INTRAMUSCULAR

## 2016-12-23 MED ORDER — ALBUTEROL SULFATE (2.5 MG/3ML) 0.083% IN NEBU
2.5000 mg | INHALATION_SOLUTION | Freq: Once | RESPIRATORY_TRACT | Status: AC
  Administered 2016-12-23: 2.5 mg via RESPIRATORY_TRACT

## 2016-12-23 MED ORDER — ALBUTEROL SULFATE (2.5 MG/3ML) 0.083% IN NEBU: 2.5000 mg | INHALATION_SOLUTION | Freq: Four times a day (QID) | RESPIRATORY_TRACT | 1 refills | Status: DC | PRN

## 2016-12-23 MED ORDER — DOXYCYCLINE HYCLATE 100 MG PO CAPS: 100.0000 mg | ORAL_CAPSULE | Freq: Two times a day (BID) | ORAL | 0 refills | Status: AC

## 2016-12-23 NOTE — Patient Instructions (Signed)
     IF you received an x-ray today, you will receive an invoice from [facility]. Please contact [facility] at [phone] with questions or concerns regarding your invoice.   IF you received labwork today, you will receive an invoice from [facility]. Please contact [facility] at [phone] with questions or concerns regarding your invoice.   Our billing staff will not be able to assist you with questions regarding bills from these companies.  You will be contacted with the lab results as soon as they are available. The fastest way to get your results is to activate your My Chart account. Instructions are located on the last page of this paperwork. If you have not heard from us regarding the results in 2 weeks, please contact this office.     

## 2016-12-23 NOTE — Progress Notes (Addendum)
12/23/2016 4:41 PM   DOB: 07-May-1951 / MRN: [id number]  SUBJECTIVE:  Jason [name] is a 66 y.o. male with a 48 pack year history of smoking presenting for cough.  Most recent chest rad significant for aortic atherosclerosis and clear lung.  Tells me that his cough started about 2 weeks ago, but starting last Thursday he began to have worsening couch and nasal congestion.  He continues to cough and feels that he is getting worse. Associates low grade fever.  Tells me that he did have a heart attack about 6 weeks, and he is seen by Dr. Georgina [name] for heart care.    Complains of increasing anxiety and wants more Xanax today.  Advised that I will not prescribe this for him but would be happy to increase his sertraline.  He is okay with trying this.    He is allergic to coreg [carvedilol] and sulfa antibiotics.   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    He  reports that he has been smoking Cigarettes.  He has a 48.00 pack-year smoking history. He has never used smokeless tobacco. He reports that he drinks alcohol. He reports that he does not use drugs. He  reports that he does not currently engage in sexual activity. The patient  has a past surgical history that includes Femoral artery - femoral artery bypass graft (Right, 2004); US EXTREMITY*L*; Cardiac catheterization (4/12); Coronary angioplasty (Jan 2004); Back surgery (Jan 2014, Dec 2011); Coronary artery bypass graft (07/24/2009); Eye surgery; Coronary angiogram (01/17/14); Coronary angiogram (4/13); Coronary  angiogram (1/15); left heart catheterization with coronary angiogram (N/A, 10/03/2011); left heart catheterization with coronary angiogram (N/A, 07/08/2013); left heart catheterization with coronary/graft angiogram (N/A, 01/17/2014); Cardiac catheterization (N/A, 03/28/2015); Left Heart Cath and Cors/Grafts Angiography (N/A, 10/23/2016); and Coronary Stent Intervention (N/A, 10/23/2016).  His family history includes Arthritis in his mother; Heart disease in his father.  Review of Systems  Constitutional: Negative for chills and fever.  Skin: Negative for itching and rash.  Neurological: Negative for dizziness.    The problem list and medications were reviewed and updated by myself where necessary and exist elsewhere in the encounter.   OBJECTIVE:  BP 122/65   Pulse 80   Temp 97.7 F (36.5 C) (Oral)   Resp 17   Ht 5\' 9"  (1.753 m)   Wt 168 lb (76.2 kg)   SpO2 98%   BMI 24.81 kg/m   Lab Results  Component Value Date   HGBA1C 8.7 (H) 10/23/2016     Physical Exam  Constitutional: He is oriented to person, place, and time. He is active and cooperative.  Eyes: EOM are normal. Pupils are equal, round, and reactive to light.  Cardiovascular: Normal rate, regular rhythm, S1 normal, S2 normal, normal heart sounds, intact distal pulses and normal pulses.  Exam reveals no gallop and no friction rub.   No murmur heard. Pulmonary/Chest: Effort normal. No stridor. No tachypnea. No respiratory distress. He has wheezes. He has no rales. He exhibits no tenderness.  Abdominal: He exhibits no distension.  Musculoskeletal: He exhibits no edema.  Neurological: He is alert and oriented to person, place, and time. He has normal strength  and normal reflexes. He is not disoriented. No cranial nerve deficit or sensory deficit. He exhibits normal muscle tone. Coordination and gait normal.  Skin: Skin is warm.  Psychiatric: His behavior is normal.  Vitals reviewed.   Results for orders placed or performed in  visit on 12/23/16 (from the past 72 hour(s))  POCT CBC     Status: Abnormal   Collection Time: 12/23/16  3:49 PM  Result Value Ref Range   WBC 7.6 4.6 - 10.2 K/uL   Lymph, poc 2.2 0.6 - 3.4   POC LYMPH PERCENT 29.6 10 - 50 %L   MID (cbc) 0.3 0 - 0.9   POC MID % 3.6 0 - 12 %M   POC Granulocyte 5.1 2 - 6.9   Granulocyte percent 66.8 37 - 80 %G   RBC 3.97 (A) 4.69 - 6.13 M/uL   Hemoglobin 12.7 (A) 14.1 - 18.1 g/dL   HCT, POC 38.4 (A) 43.5 - 53.7 %   MCV 96.7 80 - 97 fL   MCH, POC 32.1 (A) 27 - 31.2 pg   MCHC 33.1 31.8 - 35.4 g/dL   RDW, POC 12.6 %   Platelet Count, POC 248 142 - 424 K/uL   MPV 6.0 0 - 99.8 fL  POCT glucose (manual entry)     Status: Abnormal   Collection Time: 12/23/16  3:50 PM  Result Value Ref Range   POC Glucose 212 (A) 70 - 99 mg/dl   Wt Readings from Last 3 Encounters:  12/23/16 168 lb (76.2 kg)  11/26/16 166 lb 9.6 oz (75.6 kg)  10/26/16 166 lb 11.2 oz (75.6 kg)    Dg Chest 2 View  Result Date: 12/23/2016 CLINICAL DATA:  Cough, wheezing and history of smoking. EXAM: CHEST  2 VIEW COMPARISON:  10/22/2016 FINDINGS: Stable surgical changes from coronary artery bypass surgery. The cardiac silhouette, mediastinal and hilar contours are normal and stable. Mild tortuosity and calcification of the thoracic aorta. No infiltrates, edema or effusions. No worrisome pulmonary lesions. The bony thorax is intact. IMPRESSION: No acute cardiopulmonary findings. Electronically Signed   By: Jason [name] M.D.   On: 12/23/2016 16:07    ASSESSMENT AND PLAN:  Jason [name] was seen today for cough.  Diagnoses and all orders for this visit:  Cough: Given his long history of smoking I am certain that breathing studies would reveal some form of COPD.  Will treat with doxy and depo-medrol. Given high a1c will go ahead and add Amaryl to his regimen to help control any hyperglycemia caused by the steroid.  He will come back in a few weeks once he is well to talk more about his diabetes.   Of note he did ask for a refill of his xanax.  I declined to refill this but did offer to increase his sertraline and he agreed to this.   -     albuterol (PROVENTIL) (2.5 MG/3ML) 0.083% nebulizer solution 2.5 mg; Take 3 mLs (2.5 mg total) by nebulization once. -     ipratropium (ATROVENT) nebulizer solution 0.5 mg; Take 2.5 mLs (0.5 mg total) by nebulization once. -     DG Chest 2 View; Future -     POCT CBC  Type 2 diabetes mellitus with complication, without long-term current use of insulin (HCC) -     POCT glucose (manual entry) -     albuterol (PROVENTIL) (2.5 MG/3ML) 0.083% nebulizer solution; Take 3 mLs (2.5 mg total) by nebulization every 6 (six) hours as needed for  wheezing or shortness of breath. -     glimepiride (AMARYL) 1 MG tablet; Take 1 tablet (1 mg total) by mouth daily with breakfast.  COPD exacerbation (HCC) -     methylPREDNISolone acetate (DEPO-MEDROL) injection 80 mg; Inject 1 mL (80 mg total) into the muscle once. -     DME Nebulizer machine  Other orders -     sertraline (ZOLOFT) 100 MG tablet; Take 1 tablet (100 mg total) by mouth daily. TAKE 1 TABLET BY MOUTH EVERY DAY -     doxycycline (VIBRAMYCIN) 100 MG capsule; Take 1 capsule (100 mg total) by mouth 2 (two) times daily. -     Discontinue: albuterol (PROVENTIL HFA;VENTOLIN HFA) 108 (90 Base) MCG/ACT inhaler; Inhale 2 puffs into the lungs every 6 (six) hours as needed for wheezing or shortness of breath.    The patient is advised to call or return to clinic if he does not see an improvement in symptoms, or to seek the care of the closest emergency department if he worsens with the above plan.   Philis Fendt, MHS, PA-C [facility] 12/23/2016 4:41 PM

## 2017-03-16 ENCOUNTER — Other Ambulatory Visit: Payer: Self-pay | Admitting: Physician Assistant

## 2017-03-22 ENCOUNTER — Other Ambulatory Visit: Payer: Self-pay | Admitting: Cardiovascular Disease

## 2017-03-23 ENCOUNTER — Other Ambulatory Visit: Payer: Self-pay | Admitting: Cardiology

## 2017-04-14 ENCOUNTER — Ambulatory Visit (HOSPITAL_COMMUNITY): Admission: RE | Admit: 2017-04-14 | Payer: Self-pay | Source: Ambulatory Visit

## 2017-04-18 ENCOUNTER — Other Ambulatory Visit: Payer: Self-pay | Admitting: Physician Assistant

## 2017-04-27 ENCOUNTER — Other Ambulatory Visit: Payer: Self-pay | Admitting: Cardiology

## 2017-04-27 DIAGNOSIS — Z23 Encounter for immunization: Secondary | ICD-10-CM | POA: Diagnosis not present

## 2017-05-08 ENCOUNTER — Other Ambulatory Visit: Payer: Self-pay | Admitting: Physician Assistant

## 2017-05-08 DIAGNOSIS — E118 Type 2 diabetes mellitus with unspecified complications: Secondary | ICD-10-CM

## 2017-05-19 ENCOUNTER — Other Ambulatory Visit: Payer: Self-pay | Admitting: *Deleted

## 2017-05-19 MED ORDER — CLOPIDOGREL BISULFATE 75 MG PO TABS: 75.0000 mg | ORAL_TABLET | Freq: Every day | ORAL | 1 refills | Status: DC

## 2017-06-21 ENCOUNTER — Other Ambulatory Visit: Payer: Self-pay | Admitting: Physician Assistant

## 2017-06-22 NOTE — Telephone Encounter (Signed)
Refill request for Zoloft / LOV 12/23/16 with Philis Fendt /

## 2017-06-26 ENCOUNTER — Other Ambulatory Visit: Payer: Self-pay | Admitting: Cardiovascular Disease

## 2017-07-21 ENCOUNTER — Other Ambulatory Visit: Payer: Self-pay | Admitting: Physician Assistant

## 2017-07-28 ENCOUNTER — Other Ambulatory Visit: Payer: Self-pay | Admitting: Cardiovascular Disease

## 2017-07-31 ENCOUNTER — Other Ambulatory Visit: Payer: Self-pay | Admitting: Physician Assistant

## 2017-07-31 NOTE — Telephone Encounter (Signed)
metformin refill Last OV: 12/23/16 Last Refill:03/17/17 #60 Pharmacy:[facility]  Last [facility] 10/23/16

## 2017-08-01 ENCOUNTER — Other Ambulatory Visit: Payer: Self-pay

## 2017-08-01 ENCOUNTER — Encounter: Payer: Self-pay | Admitting: Physician Assistant

## 2017-08-01 ENCOUNTER — Ambulatory Visit (INDEPENDENT_AMBULATORY_CARE_PROVIDER_SITE_OTHER): Payer: Medicare Other | Admitting: Physician Assistant

## 2017-08-01 ENCOUNTER — Ambulatory Visit (INDEPENDENT_AMBULATORY_CARE_PROVIDER_SITE_OTHER): Payer: Medicare Other

## 2017-08-01 VITALS — BP 100/70 | HR 100 | Temp 97.6°F | Resp 16 | Ht 68.0 in | Wt 163.4 lb

## 2017-08-01 DIAGNOSIS — R6889 Other general symptoms and signs: Secondary | ICD-10-CM

## 2017-08-01 DIAGNOSIS — Z794 Long term (current) use of insulin: Secondary | ICD-10-CM | POA: Diagnosis not present

## 2017-08-01 DIAGNOSIS — E1159 Type 2 diabetes mellitus with other circulatory complications: Secondary | ICD-10-CM | POA: Diagnosis not present

## 2017-08-01 DIAGNOSIS — H6122 Impacted cerumen, left ear: Secondary | ICD-10-CM

## 2017-08-01 DIAGNOSIS — R05 Cough: Secondary | ICD-10-CM

## 2017-08-01 DIAGNOSIS — J22 Unspecified acute lower respiratory infection: Secondary | ICD-10-CM | POA: Diagnosis not present

## 2017-08-01 DIAGNOSIS — R059 Cough, unspecified: Secondary | ICD-10-CM

## 2017-08-01 DIAGNOSIS — F411 Generalized anxiety disorder: Secondary | ICD-10-CM

## 2017-08-01 LAB — POCT INFLUENZA A/B
Influenza A, POC: NEGATIVE
Influenza B, POC: NEGATIVE

## 2017-08-01 MED ORDER — BENZONATATE 100 MG PO CAPS: 100.0000 mg | ORAL_CAPSULE | Freq: Three times a day (TID) | ORAL | 0 refills | Status: DC | PRN

## 2017-08-01 MED ORDER — HYDROCODONE-HOMATROPINE 5-1.5 MG/5ML PO SYRP: 5.0000 mL | ORAL_SOLUTION | Freq: Three times a day (TID) | ORAL | 0 refills | Status: DC | PRN

## 2017-08-01 MED ORDER — METFORMIN HCL 500 MG PO TABS: ORAL_TABLET | ORAL | 2 refills | Status: DC

## 2017-08-01 MED ORDER — SERTRALINE HCL 100 MG PO TABS: 100.0000 mg | ORAL_TABLET | Freq: Every day | ORAL | 1 refills | Status: DC

## 2017-08-01 MED ORDER — AZITHROMYCIN 250 MG PO TABS
ORAL_TABLET | ORAL | 0 refills | Status: DC
Start: 2017-08-01 — End: 2017-09-30

## 2017-08-01 NOTE — Patient Instructions (Addendum)
  Come back and see me for diabetes check in 3 months.   Stay well hydrated. Get lost of rest. Wash your hands often.   -Foods that can help speed recovery: honey, garlic, chicken soup, elderberries, green tea.  -Supplements that can help speed recovery: vitamin C, zinc, elderberry extract, quercetin, ginseng, selenium  Advil or ibuprofen for pain. Do not take Aspirin.  Drink enough water and fluids to keep your urine clear or pale yellow.  For sore throat try using a honey-based tea. Use 3 teaspoons of honey with juice squeezed from half lemon. Place shaved pieces of ginger into 1/2-1 cup of water and warm over stove top. Then mix the ingredients and repeat every 4 hours as needed.  Cough Syrup Recipe: Sweet Lemon & Honey Thyme  Ingredients a handful of fresh thyme sprigs   1 pint of water (2 cups)  1/2 cup honey (raw is best, but regular will do)  1/2 lemon chopped Instructions 1. Place the lemon in the pint jar and cover with the honey. The honey will macerate the lemons and draw out liquids which taste so delicious! 2. Meanwhile, toss the thyme leaves into a saucepan and cover them with the water. 3. Bring the water to a gentle simmer and reduce it to half, about a cup of tea. 4. When the tea is reduced and cooled a bit, strain the sprigs & leaves, add it into the pint jar and stir it well. 5. Give it a shake and use a spoonful as needed. 6. Store your homemade cough syrup in the refrigerator for about a month.  Is there anything I can do on my own to get rid of my cough? Yes. To help get rid of your cough, you can: ?Use a humidifier in your bedroom ?Use an over-the-counter cough medicine, or suck on cough drops or hard candy ?Stop smoking, if you smoke ?If you have allergies, avoid the things you are allergic to (like pollen, dust, animals, or mold) If you have acid reflux, your doctor or nurse will tell you which lifestyle changes can help reduce symptoms.    Thank you for  coming in today. I hope you feel we met your needs.  Feel free to call PCP if you have any questions or further requests.  Please consider signing up for MyChart if you do not already have it, as this is a great way to communicate with me.  Best,  Whitney McVey, PA-C   IF you received an x-ray today, you will receive an invoice from [facility]. Please contact [facility] at [phone] with questions or concerns regarding your invoice.   IF you received labwork today, you will receive an invoice from [place]. Please contact [facility] at [phone] with questions or concerns regarding your invoice.   Our billing staff will not be able to assist you with questions regarding bills from these companies.  You will be contacted with the lab results as soon as they are available. The fastest way to get your results is to activate your My Chart account. Instructions are located on the last page of this paperwork. If you have not heard from [place] regarding the results in 2 weeks, please contact this office.

## 2017-08-01 NOTE — Progress Notes (Signed)
Jason [name]  MRN: [id number] DOB: [birth year]  PCP: Patient, [name]  Subjective:  Pt is a 67 year old male HTN, unstable angina, NSTEMI, CAD, hypothyroidism, DM who presents to clinic for cough x >1 week.  He endorses "severe" fatigue and runny nose. Pt feels "generally miserable".  Fever and chills 4, 3, and 2 days ago.  not sleeping well due to cough mucinex - helped a little.  He ate lunch with his sick brother early last week.   He needs refills:  DM - Metformin 500mg  bid. He has been out of medication for about 3 days.  "I have not been taking care of myself since Dr. Everlene Farrier left".   Anxiety - controlled with Zoloft 100 mg. Would like refill of Xananx.   Review of Systems  Constitutional: Positive for chills, fatigue and fever. Negative for diaphoresis.  HENT: Positive for congestion and postnasal drip. Negative for rhinorrhea, sinus pressure, sinus pain and sore throat.   Respiratory: Positive for cough. Negative for chest tightness, shortness of breath and wheezing.   Cardiovascular: Negative for chest pain, palpitations and leg swelling.  Gastrointestinal: Negative for diarrhea, nausea and vomiting.  Neurological: Negative for dizziness, syncope, light-headedness and headaches.  Psychiatric/Behavioral: Positive for sleep disturbance. The patient is nervous/anxious.     Patient Active Problem List   Diagnosis Date Noted  . Acute on chronic combined systolic and diastolic CHF (congestive heart failure) ([place]) 10/25/2016  . NSTEMI (non-ST elevated myocardial infarction) ([place]) 10/22/2016  . Hypothyroidism 08/17/2015  . RBBB 05/11/2015  . Numbness of hand   . Hyponatremia 04/04/2015  . Hypotension 04/04/2015  . Hemispheric carotid artery syndrome   . Carotid artery narrowing 03/28/2015  . Carotid stenosis- moderate 2011, 95% 2016 s/p stent 02/21/2014  . Unstable angina ([place]) 01/16/2014  . Tobacco abuse 01/16/2014  . Chest pain 07/08/2013  . Substance abuse in  remission ([place]) 10/01/2012  . Radiculitis 02/10/2012  . CAD, CABG Feb 2011, cath x 4 since-medical Rx 10/03/2011  . PVD, hx Rt femoral endarterectomy 2004 10/03/2011  . DM (diabetes mellitus) ([place]) 10/02/2011  . HTN (hypertension) 10/02/2011  . Hyperlipidemia 10/02/2011    Current Outpatient Medications on File Prior to Visit  Medication Sig Dispense Refill  . acetaminophen (TYLENOL) 325 MG tablet Take 2 tablets (650 mg total) by mouth every 4 (four) hours as needed for headache or mild pain.    Marland Kitchen albuterol (PROVENTIL) (2.5 MG/3ML) 0.083% nebulizer solution USE 1 VIAL IN NEBULIZER EVERY 6 HOURS AS NEEDED 150 mL 1  . aspirin 81 MG tablet Take 1 tablet (81 mg total) by mouth daily.    Marland Kitchen atorvastatin (LIPITOR) 80 MG tablet Take 1 tablet (80 mg total) by mouth daily at 6 PM. 30 tablet 5  . clopidogrel (PLAVIX) 75 MG tablet Take 1 tablet (75 mg total) by mouth daily. 90 tablet 1  . isosorbide mononitrate (IMDUR) 60 MG 24 hr tablet TAKE 1.5 TABLETS BY MOUTH DAILY. 45 tablet 0  . loratadine (CLARITIN) 10 MG tablet Take 10 mg by mouth daily.    . metFORMIN (GLUCOPHAGE) 500 MG tablet TAKE 1 TABLET BY MOUTH TWICE A DAY WITH A MEAL 60 tablet 0  . metoprolol succinate (TOPROL-XL) 100 MG 24 hr tablet TAKE 1 TABLET BY MOUTH EVERY DAY TAKE WITH OR IMMEDIATELY FOLLOWING A MEAL 90 tablet 1  . nitroGLYCERIN (NITROSTAT) 0.4 MG SL tablet PLACE 1 TABLET (0.4 MG TOTAL) UNDER THE TONGUE EVERY 5 (FIVE) MINUTES AS NEEDED FOR  CHEST PAIN. 25 tablet 2  . nitroGLYCERIN (NITROSTAT) 0.4 MG SL tablet PLACE 1 TABLET (0.4 MG TOTAL) UNDER THE TONGUE EVERY 5 (FIVE) MINUTES AS NEEDED FOR CHEST PAIN. 25 tablet 2  . ranitidine (ZANTAC) 150 MG tablet Take 150 mg by mouth daily as needed for heartburn. For acid reflux    . ranolazine (RANEXA) 1000 MG SR tablet Take 1 tablet (1,000 mg total) by mouth 2 (two) times daily. 60 tablet 6  . ALPRAZolam (XANAX) 0.5 MG tablet TAKE 1/2 - 1 TABLET BY MOUTH 2 TIMES DAILY AS NEEDED FOR ANXIETY  OR PANIC EPISODE (Patient not taking: Reported on 08/01/2017) 30 tablet 0  . furosemide (LASIX) 40 MG tablet Take 1 tablet (40 mg total) by mouth daily as needed for edema. (Patient not taking: Reported on 08/01/2017) 30 tablet 5  . glimepiride (AMARYL) 1 MG tablet Take 1 tablet (1 mg total) by mouth daily with breakfast. (Patient not taking: Reported on 08/01/2017) 4 tablet 0  . levothyroxine (SYNTHROID, LEVOTHROID) 50 MCG tablet TAKE 1 TABLET BY MOUTH EVERY DAY (Patient not taking: Reported on 08/01/2017) 30 tablet 0  . nicotine (NICODERM CQ - DOSED IN MG/24 HOURS) 21 mg/24hr patch Place 1 patch (21 mg total) onto the skin daily. (Patient not taking: Reported on 08/01/2017) 28 patch 0   No current facility-administered medications on file prior to visit.     Allergies  Allergen Reactions  . Coreg [Carvedilol] Nausea And Vomiting and Other (See Comments)    Per patient made him dizzy and light sensitive  . Sulfa Antibiotics Nausea And Vomiting and Other (See Comments)    Also headaches      Objective:  BP 100/70   Pulse 100   Temp 97.6 F (36.4 C) (Oral)   Resp 16   Ht 5\' 8"  (1.727 m)   Wt 163 lb 6.4 oz (74.1 kg)   SpO2 97%   BMI 24.84 kg/m   Physical Exam  Constitutional: He is oriented to person, place, and time and well-developed, well-nourished, and in no distress. No distress.  HENT:  Right Ear: Tympanic membrane normal.  Left Ear: Tympanic membrane normal.  Nose: Mucosal edema present. No rhinorrhea. Right sinus exhibits no maxillary sinus tenderness and no frontal sinus tenderness. Left sinus exhibits no maxillary sinus tenderness and no frontal sinus tenderness.  Mouth/Throat: Oropharynx is clear and moist and mucous membranes are normal.  Cardiovascular: Normal rate, regular rhythm and normal heart sounds.  Pulmonary/Chest: Effort normal and breath sounds normal. No respiratory distress. He has no wheezes. He has no rales.  Neurological: He is alert and oriented to person,  place, and time. GCS score is 15.  Skin: Skin is warm and dry.  Psychiatric: Mood, memory, affect and judgment normal.  Vitals reviewed.  Dg Chest 2 View  Result Date: 08/01/2017 CLINICAL DATA:  Cough for 10 days EXAM: CHEST  2 VIEW COMPARISON:  12/23/2016 FINDINGS: Prior CABG. Heart and mediastinal contours are within normal limits. No focal opacities or effusions. No acute bony abnormality. IMPRESSION: No active cardiopulmonary disease. Electronically Signed   By: Rolm Baptise M.D.   On: 08/01/2017 15:56   Results for orders placed or performed in visit on 08/01/17  POCT Influenza A/B  Result Value Ref Range   Influenza A, POC Negative Negative   Influenza B, POC Negative Negative    Assessment and Plan :  1. Lower respiratory infection 2. Flu-like symptoms - azithromycin (ZITHROMAX) 250 MG tablet; Take 2 tabs PO x  1 dose, then 1 tab PO QD x 4 days  Dispense: 6 tablet; Refill: 0 - POCT Influenza A/B - Pt presents c/o productive cough x > 1 week. Negative chest xray. Plan to cover, given his PMH. Encouraged hydration and rest. RTC if no improvement in 5 days.  3. Cough - DG Chest 2 View; Future - benzonatate (TESSALON) 100 MG capsule; Take 1-2 capsules (100-200 mg total) by mouth 3 (three) times daily as needed for cough.  Dispense: 40 capsule; Refill: 0 - HYDROcodone-homatropine (HYCODAN) 5-1.5 MG/5ML syrup; Take 5 mLs by mouth every 8 (eight) hours as needed for cough.  Dispense: 120 mL; Refill: 0  4. Impacted cerumen of left ear - Ear wax removal  5. Generalized anxiety disorder - sertraline (ZOLOFT) 100 MG tablet; Take 1 tablet (100 mg total) by mouth daily.  Dispense: 30 tablet; Refill: 1 - will not refill xanax today.  6. Type 2 diabetes mellitus with other circulatory complication, with long-term current use of insulin (HCC) - metFORMIN (GLUCOPHAGE) 500 MG tablet; TAKE 1 TABLET BY MOUTH TWICE A DAY WITH A MEAL  Dispense: 60 tablet; Refill: 2 - Last A1C check was 6 months  ago. He will RTC in 3 months for routine DM check. Former [name] patient.    Mercer Pod, PA-C  [facility] at [facility] 08/01/2017 4:39 PM

## 2017-08-03 NOTE — Telephone Encounter (Signed)
Written on 08/01/2017

## 2017-09-03 ENCOUNTER — Other Ambulatory Visit: Payer: Self-pay | Admitting: Cardiovascular Disease

## 2017-09-04 NOTE — Telephone Encounter (Signed)
REFILL 

## 2017-09-23 ENCOUNTER — Other Ambulatory Visit: Payer: Self-pay | Admitting: Physician Assistant

## 2017-09-23 DIAGNOSIS — F411 Generalized anxiety disorder: Secondary | ICD-10-CM

## 2017-09-27 ENCOUNTER — Other Ambulatory Visit: Payer: Self-pay | Admitting: Cardiovascular Disease

## 2017-09-29 ENCOUNTER — Ambulatory Visit: Payer: Medicare Other | Admitting: Physician Assistant

## 2017-09-30 ENCOUNTER — Encounter: Payer: Self-pay | Admitting: Physician Assistant

## 2017-09-30 ENCOUNTER — Ambulatory Visit (INDEPENDENT_AMBULATORY_CARE_PROVIDER_SITE_OTHER): Payer: Medicare Other | Admitting: Physician Assistant

## 2017-09-30 ENCOUNTER — Ambulatory Visit (INDEPENDENT_AMBULATORY_CARE_PROVIDER_SITE_OTHER): Payer: Medicare Other

## 2017-09-30 VITALS — BP 153/68 | HR 79 | Temp 95.2°F | Resp 16 | Ht 68.25 in | Wt 165.8 lb

## 2017-09-30 DIAGNOSIS — Z13220 Encounter for screening for lipoid disorders: Secondary | ICD-10-CM | POA: Diagnosis not present

## 2017-09-30 DIAGNOSIS — Z794 Long term (current) use of insulin: Secondary | ICD-10-CM | POA: Diagnosis not present

## 2017-09-30 DIAGNOSIS — G8929 Other chronic pain: Secondary | ICD-10-CM

## 2017-09-30 DIAGNOSIS — E1159 Type 2 diabetes mellitus with other circulatory complications: Secondary | ICD-10-CM

## 2017-09-30 DIAGNOSIS — F329 Major depressive disorder, single episode, unspecified: Secondary | ICD-10-CM | POA: Diagnosis not present

## 2017-09-30 DIAGNOSIS — M5442 Lumbago with sciatica, left side: Secondary | ICD-10-CM

## 2017-09-30 DIAGNOSIS — M545 Low back pain: Secondary | ICD-10-CM | POA: Diagnosis not present

## 2017-09-30 DIAGNOSIS — F411 Generalized anxiety disorder: Secondary | ICD-10-CM

## 2017-09-30 DIAGNOSIS — F32A Depression, unspecified: Secondary | ICD-10-CM

## 2017-09-30 LAB — POCT GLYCOSYLATED HEMOGLOBIN (HGB A1C): Hemoglobin A1C: 9.1

## 2017-09-30 MED ORDER — METFORMIN HCL 1000 MG PO TABS: ORAL_TABLET | ORAL | 3 refills | Status: DC

## 2017-09-30 MED ORDER — SERTRALINE HCL 50 MG PO TABS: ORAL_TABLET | ORAL | 3 refills | Status: DC

## 2017-09-30 MED ORDER — TRAMADOL-ACETAMINOPHEN 37.5-325 MG PO TABS: 1.0000 | ORAL_TABLET | Freq: Four times a day (QID) | ORAL | 1 refills | Status: DC | PRN

## 2017-09-30 NOTE — Progress Notes (Signed)
Jason [name]  MRN: [id number] DOB: 1950-09-10  PCP: Patient, [name]  Subjective:  Pt is a 67 year old male PMH DM, unstable angina, RBBB, CAD, PVD, hypothyroid who presents to clinic for back pain and f/u DM.  DM - last OV for this problem 08/01/2017: "Metformin 522m bid. He has been out of medication for about 3 days. "I have not been taking care of myself since Dr. DEverlene Farrierleft".  today he states he has been taking Metformin 5050mbid.  Denies medication side effects.   Back pain x 2 months. H/o fusion of the spine about 4 years ago. Worsening acute pain x 3 months. Certain nights when he lays down he cannot get comfortable. He can't stand for more than five min. "shooting pains" down his left leg - happens "any time". Pain "just about takes me to my knees". He can no longer do things around his house like mow the yard or sweep his kitchen.  He has been taking Ibuprofen and tylenol.   Hydrocodone "make me sick on my stomach".   Endorses worsening depression and anxiety. He is taking Zoloft 10062md. Denies SI or HI  Review of Systems  Endocrine: Negative for polydipsia, polyphagia and polyuria.  Musculoskeletal: Positive for back pain.  Neurological: Positive for weakness. Negative for numbness.  Psychiatric/Behavioral: Positive for dysphoric mood. Negative for self-injury and suicidal ideas. The patient is nervous/anxious.     Patient Active Problem List   Diagnosis Date Noted  . Acute on chronic combined systolic and diastolic CHF (congestive heart failure) ([place]CC[place]5/10/2016  . NSTEMI (non-ST elevated myocardial infarction) ([place]/07/2016  . Hypothyroidism 08/17/2015  . RBBB 05/11/2015  . Numbness of hand   . Hyponatremia 04/04/2015  . Hypotension 04/04/2015  . Hemispheric carotid artery syndrome   . Carotid artery narrowing 03/28/2015  . Carotid stenosis- moderate 2011, 95% 2016 s/p stent 02/21/2014  . Unstable angina ([place]7/27/2015  . Tobacco abuse 01/16/2014  .  Chest pain 07/08/2013  . Substance abuse in remission ([place]4/04/2013  . Radiculitis 02/10/2012  . CAD, CABG Feb 2011, cath x 4 since-medical Rx 10/03/2011  . PVD, hx Rt femoral endarterectomy 2004 10/03/2011  . DM (diabetes mellitus) ([place]CC[place]4/04/2012  . HTN (hypertension) 10/02/2011  . Hyperlipidemia 10/02/2011    Current Outpatient Medications on File Prior to Visit  Medication Sig Dispense Refill  . acetaminophen (TYLENOL) 325 MG tablet Take 2 tablets (650 mg total) by mouth every 4 (four) hours as needed for headache or mild pain.    . aMarland Kitchenbuterol (PROVENTIL) (2.5 MG/3ML) 0.083% nebulizer solution USE 1 VIAL IN NEBULIZER EVERY 6 HOURS AS NEEDED 150 mL 1  . aspirin 81 MG tablet Take 1 tablet (81 mg total) by mouth daily.    . aMarland Kitchenorvastatin (LIPITOR) 80 MG tablet Take 1 tablet (80 mg total) by mouth daily at 6 PM. 30 tablet 5  . clopidogrel (PLAVIX) 75 MG tablet Take 1 tablet (75 mg total) by mouth daily. 90 tablet 1  . glimepiride (AMARYL) 1 MG tablet Take 1 tablet (1 mg total) by mouth daily with breakfast. 4 tablet 0  . isosorbide mononitrate (IMDUR) 60 MG 24 hr tablet TAKE 1.5 TABLETS BY MOUTH DAILY. 45 tablet 1  . levothyroxine (SYNTHROID, LEVOTHROID) 50 MCG tablet TAKE 1 TABLET BY MOUTH EVERY DAY 30 tablet 0  . loratadine (CLARITIN) 10 MG tablet Take 10 mg by mouth daily.    . metFORMIN (GLUCOPHAGE) 500 MG tablet TAKE 1 TABLET  BY MOUTH TWICE A DAY WITH A MEAL 60 tablet 2  . metoprolol succinate (TOPROL-XL) 100 MG 24 hr tablet TAKE 1 TABLET BY MOUTH EVERY DAY TAKE WITH OR IMMEDIATELY FOLLOWING A MEAL 90 tablet 1  . nitroGLYCERIN (NITROSTAT) 0.4 MG SL tablet PLACE 1 TABLET (0.4 MG TOTAL) UNDER THE TONGUE EVERY 5 (FIVE) MINUTES AS NEEDED FOR CHEST PAIN. 25 tablet 2  . nitroGLYCERIN (NITROSTAT) 0.4 MG SL tablet PLACE 1 TABLET (0.4 MG TOTAL) UNDER THE TONGUE EVERY 5 (FIVE) MINUTES AS NEEDED FOR CHEST PAIN. 25 tablet 2  . ranitidine (ZANTAC) 150 MG tablet Take 150 mg by mouth daily as needed  for heartburn. For acid reflux    . ranolazine (RANEXA) 1000 MG SR tablet Take 1 tablet (1,000 mg total) by mouth 2 (two) times daily. NEED OV. 60 tablet 3  . sertraline (ZOLOFT) 100 MG tablet TAKE 1 TABLET BY MOUTH EVERY DAY 30 tablet 1  . ALPRAZolam (XANAX) 0.5 MG tablet TAKE 1/2 - 1 TABLET BY MOUTH 2 TIMES DAILY AS NEEDED FOR ANXIETY OR PANIC EPISODE (Patient not taking: Reported on 08/01/2017) 30 tablet 0  . furosemide (LASIX) 40 MG tablet Take 1 tablet (40 mg total) by mouth daily as needed for edema. (Patient not taking: Reported on 08/01/2017) 30 tablet 5  . nicotine (NICODERM CQ - DOSED IN MG/24 HOURS) 21 mg/24hr patch Place 1 patch (21 mg total) onto the skin daily. (Patient not taking: Reported on 08/01/2017) 28 patch 0   No current facility-administered medications on file prior to visit.     Allergies  Allergen Reactions  . Coreg [Carvedilol] Nausea And Vomiting and Other (See Comments)    Per patient made him dizzy and light sensitive  . Sulfa Antibiotics Nausea And Vomiting and Other (See Comments)    Also headaches      Objective:  BP (!) 153/68   Pulse 79   Temp (!) 95.2 F (35.1 C) (Axillary)   Resp 16   Ht 5' 8.25" (1.734 m)   Wt 165 lb 12.8 oz (75.2 kg)   SpO2 97%   BMI 25.03 kg/m  Diabetic Foot Exam - Simple   Simple Foot Form Diabetic Foot exam was performed with the following findings:  Yes 09/30/2017  2:21 PM  Visual Inspection No deformities, no ulcerations, no other skin breakdown bilaterally:  Yes Sensation Testing Intact to touch and monofilament testing bilaterally:  Yes Pulse Check Comments Felt pedal pulses in the right foot, couldn't feel in the left foot.      Physical Exam  Constitutional: He is oriented to person, place, and time. He appears well-developed and well-nourished.  Cardiovascular: Normal rate and regular rhythm.  Pulmonary/Chest: Effort normal. No respiratory distress.  Musculoskeletal:       Lumbar back: He exhibits decreased  range of motion and bony tenderness. He exhibits no tenderness.  Neurological: He is alert and oriented to person, place, and time.  Strength 5/5 left leg. Strength 4/5 right leg +SLR  Skin: Skin is warm and dry.  Psychiatric: He has a normal mood and affect. His behavior is normal. Judgment and thought content normal.  Vitals reviewed.   Lab Results  Component Value Date   HGBA1C 8.7 (H) 10/23/2016   Results for orders placed or performed in visit on 09/30/17  POCT glycosylated hemoglobin (Hb A1C)  Result Value Ref Range   Hemoglobin A1C 9.1    Dg Lumbar Spine Complete  Result Date: 09/30/2017 CLINICAL DATA:  Acute on chronic  back pain, initial encounter EXAM: LUMBAR SPINE - COMPLETE 4+ VIEW COMPARISON:  12/06/2014 FINDINGS: Postsurgical changes are noted at L4-5 and L5-S1 with interbody fusion and posterior fixation. Vertebral body height is well maintained. Multilevel disc space narrowing is noted from T12-L4. This has increased somewhat in the interval from the prior exam particularly at L2-3 and L3-4. No antero or retrolisthesis is noted. Diffuse aortic calcifications are seen. IMPRESSION: Multilevel degenerative change as well as postsurgical changes as described somewhat progressed when compared with the prior exam. Electronically Signed   By: Inez Catalina M.D.   On: 09/30/2017 15:01    Assessment and Plan :  1. Type 2 diabetes mellitus with other circulatory complication, with long-term current use of insulin (HCC) 2. Screening, lipid - HM Diabetes Foot Exam - Microalbumin, urine - POCT glycosylated hemoglobin (Hb A1C) - CMP14+EGFR - metFORMIN (GLUCOPHAGE) 1000 MG tablet; TAKE 1 TABLET BY MOUTH TWICE A DAY WITH A MEAL  Dispense: 180 tablet; Refill: 3 - Lipid panel - Sugars are worsening. Plan to increase Metformin to 1,031m bid. Advised improved diet. RTC in 2 months for recheck of all.  3. Chronic left-sided low back pain with left-sided sciatica - DG Lumbar Spine  Complete; Future - Ambulatory referral to Orthopedic Surgery - traMADol-acetaminophen (ULTRACET) 37.5-325 MG tablet; Take 1 tablet by mouth every 6 (six) hours as needed.  Dispense: 30 tablet; Refill: 1 - Pt c/o acute on chronic back pain which is affecting his ADLs. Lumbar spine shows worsening DDD. Plan to refer to ortho for eval and treatment.  4. Depression, unspecified depression type 5. Generalized anxiety disorder - sertraline (ZOLOFT) 50 MG tablet; Take 516min addition to 10064mor a total of 150m31my  Dispense: 30 tablet; Refill: 3 - Pt c/o worsening depression and anxiety - suspect this possibly 2/2 back pain and no longer being able to get things done. Plan to increase Zoloft to 150mg72mWhitnMercer PodC  [facility] [place] 09/30/2017 2:24 PM

## 2017-09-30 NOTE — Patient Instructions (Addendum)
Your blood sugar is increasing. We need to fix that!  I am increasing your dose of Metformin to 1,000mg .  Take Metformin 1,000mg  twice daily.  Try to work on improving your diet to diabetes-friendly foods (see below).   Start taking Zoloft 150mg .   Come back and see me for recheck in 2 months.    Diabetes Mellitus and Nutrition When you have diabetes (diabetes mellitus), it is very important to have healthy eating habits because your blood sugar (glucose) levels are greatly affected by what you eat and drink. Eating healthy foods in the appropriate amounts, at about the same times every day, can help you:  Control your blood glucose.  Lower your risk of heart disease.  Improve your blood pressure.  Reach or maintain a healthy weight.  Every person with diabetes is different, and each person has different needs for a meal plan. Your health care provider may recommend that you work with a diet and nutrition specialist (dietitian) to make a meal plan that is best for you. Your meal plan may vary depending on factors such as:  The calories you need.  The medicines you take.  Your weight.  Your blood glucose, blood pressure, and cholesterol levels.  Your activity level.  Other health conditions you have, such as heart or kidney disease.  How do carbohydrates affect me? Carbohydrates affect your blood glucose level more than any other type of food. Eating carbohydrates naturally increases the amount of glucose in your blood. Carbohydrate counting is a method for keeping track of how many carbohydrates you eat. Counting carbohydrates is important to keep your blood glucose at a healthy level, especially if you use insulin or take certain oral diabetes medicines. It is important to know how many carbohydrates you can safely have in each meal. This is different for every person. Your dietitian can help you calculate how many carbohydrates you should have at each meal and for snack. Foods  that contain carbohydrates include:  Bread, cereal, rice, pasta, and crackers.  Potatoes and corn.  Peas, beans, and lentils.  Milk and yogurt.  Fruit and juice.  Desserts, such as cakes, cookies, ice cream, and candy.  How does alcohol affect me? Alcohol can cause a sudden decrease in blood glucose (hypoglycemia), especially if you use insulin or take certain oral diabetes medicines. Hypoglycemia can be a life-threatening condition. Symptoms of hypoglycemia (sleepiness, dizziness, and confusion) are similar to symptoms of having too much alcohol. If your health care provider says that alcohol is safe for you, follow these guidelines:  Limit alcohol intake to no more than 1 drink per day for nonpregnant women and 2 drinks per day for men. One drink equals 12 oz of beer, 5 oz of wine, or 1 oz of hard liquor.  Do not drink on an empty stomach.  Keep yourself hydrated with water, diet soda, or unsweetened iced tea.  Keep in mind that regular soda, juice, and other mixers may contain a lot of sugar and must be counted as carbohydrates.  What are tips for following this plan? Reading food labels  Start by checking the serving size on the label. The amount of calories, carbohydrates, fats, and other nutrients listed on the label are based on one serving of the food. Many foods contain more than one serving per package.  Check the total grams (g) of carbohydrates in one serving. You can calculate the number of servings of carbohydrates in one serving by dividing the total carbohydrates by 15.  For example, if a food has 30 g of total carbohydrates, it would be equal to 2 servings of carbohydrates.  Check the number of grams (g) of saturated and trans fats in one serving. Choose foods that have low or no amount of these fats.  Check the number of milligrams (mg) of sodium in one serving. Most people should limit total sodium intake to less than 2,300 mg per day.  Always check the  nutrition information of foods labeled as "low-fat" or "nonfat". These foods may be higher in added sugar or refined carbohydrates and should be avoided.  Talk to your dietitian to identify your daily goals for nutrients listed on the label. Shopping  Avoid buying canned, premade, or processed foods. These foods tend to be high in fat, sodium, and added sugar.  Shop around the outside edge of the grocery store. This includes fresh fruits and vegetables, bulk grains, fresh meats, and fresh dairy. Cooking  Use low-heat cooking methods, such as baking, instead of high-heat cooking methods like deep frying.  Cook using healthy oils, such as olive, canola, or sunflower oil.  Avoid cooking with butter, cream, or high-fat meats. Meal planning  Eat meals and snacks regularly, preferably at the same times every day. Avoid going long periods of time without eating.  Eat foods high in fiber, such as fresh fruits, vegetables, beans, and whole grains. Talk to your dietitian about how many servings of carbohydrates you can eat at each meal.  Eat 4-6 ounces of lean protein each day, such as lean meat, chicken, fish, eggs, or tofu. 1 ounce is equal to 1 ounce of meat, chicken, or fish, 1 egg, or 1/4 cup of tofu.  Eat some foods each day that contain healthy fats, such as avocado, nuts, seeds, and fish. Lifestyle   Check your blood glucose regularly.  Exercise at least 30 minutes 5 or more days each week, or as told by your health care provider.  Take medicines as told by your health care provider.  Do not use any products that contain nicotine or tobacco, such as cigarettes and e-cigarettes. If you need help quitting, ask your health care provider.  Work with a Social worker or diabetes educator to identify strategies to manage stress and any emotional and social challenges. What are some questions to ask my health care provider?  Do I need to meet with a diabetes educator?  Do I need to meet  with a dietitian?  What number can I call if I have questions?  When are the best times to check my blood glucose? Where to find more information:  American Diabetes Association: diabetes.org/food-and-fitness/food  Academy of Nutrition and Dietetics: PokerClues.dk  [employer] of Diabetes and Digestive and Kidney Diseases (NIH): ContactWire.be Summary  A healthy meal plan will help you control your blood glucose and maintain a healthy lifestyle.  Working with a diet and nutrition specialist (dietitian) can help you make a meal plan that is best for you.  Keep in mind that carbohydrates and alcohol have immediate effects on your blood glucose levels. It is important to count carbohydrates and to use alcohol carefully. This information is not intended to replace advice given to you by your health care provider. Make sure you discuss any questions you have with your health care provider. Document Released: 03/06/2005 Document Revised: 07/14/2016 Document Reviewed: 07/14/2016 Elsevier Interactive Patient Education  2018 [employer].  Acetaminophen; Tramadol tablets What is this medicine? ACETAMINOPHEN; TRAMADOL (a set a MEE noe fen; TRA  ma dole) is a pain reliever. It is used to treat short term moderate pain. This medicine may be used for other purposes; ask your health care provider or pharmacist if you have questions. COMMON BRAND NAME(S): Ultracet What should I tell my health care provider before I take this medicine? They need to know if you have any of these conditions: -brain tumor -depression -drug abuse or addiction -head injury -if you often drink alcohol -kidney disease or trouble passing urine -liver disease -lung disease, asthma, or breathing problems -seizures or epilepsy -suicidal thoughts, plans, or attempt; a previous suicide attempt  by you or a family member -an unusual or allergic reaction to acetaminophen, tramadol, codeine, other opioid analgesics, other medicines, foods, dyes, or preservatives -pregnant or trying to get pregnant -breast-feeding How should I use this medicine? Take this medicine by mouth with a full glass of water. Follow the directions on the prescription label. If the medicine upsets your stomach, take it with food or milk. Do not take your medicine more often than directed. A special MedGuide will be given to you by the pharmacist with each prescription and refill. Be sure to read this information carefully each time. Talk to your pediatrician regarding the use of this medicine in children. Special care may be needed. Overdosage: If you think you have taken too much of this medicine contact a poison control center or emergency room at once. NOTE: This medicine is only for you. Do not share this medicine with others. What if I miss a dose? If you miss a dose, take it as soon as you can. If it is almost time for your next dose, take only that dose. Do not take double or extra doses. What may interact with this medicine? Do not take this medication with any of the following medicines: -MAOIs like Carbex, Eldepryl, Marplan, Nardil, and Parnate This medicine may also interact with the following medications: -alcohol -antihistamines for allergy, cough and cold -certain medicines for anxiety or sleep -certain medicines for depression like amitriptyline, fluoxetine, sertraline -certain medicines for migraine headache like almotriptan, eletriptan, frovatriptan, naratriptan, rizatriptan, sumatriptan, zolmitriptan -certain medicines for seizures like carbamazepine, oxcarbazepine, phenobarbital, primidone -certain medicines that treat or prevent blood clots like warfarin -digoxin -furazolidone -general anesthetics like halothane, isoflurane, methoxyflurane, propofol -linezolid -local anesthetics like  lidocaine, pramoxine, tetracaine -medicines that relax muscles for surgery -other narcotic medicines for pain or cough -phenothiazines like chlorpromazine, mesoridazine, prochlorperazine, thioridazine -procarbazine This list may not describe all possible interactions. Give your health care provider a list of all the medicines, herbs, non-prescription drugs, or dietary supplements you use. Also tell them if you smoke, drink alcohol, or use illegal drugs. Some items may interact with your medicine. What should I watch for while using this medicine? Tell your doctor or health care professional if your pain does not go away, if it gets worse, or if you have new or a different type of pain. You may develop tolerance to the medicine. Tolerance means that you will need a higher dose of the medicine for pain relief. Tolerance is normal and is expected if you take the medicine for a long time. Do not suddenly stop taking your medicine because you may develop a severe reaction. Your body becomes used to the medicine. This does NOT mean you are addicted. Addiction is a behavior related to getting and using a drug for a non-medical reason. If you have pain, you have a medical reason to take pain medicine. Your doctor will tell  you how much medicine to take. If your doctor wants you to stop the medicine, the dose will be slowly lowered over time to avoid any side effects. There are different types of narcotic medicines (opiates). If you take more than one type at the same time or if you are taking another medicine that also causes drowsiness, you may have more side effects. Give your health care provider a list of all medicines you use. Your doctor will tell you how much medicine to take. Do not take more medicine than directed. Call emergency for help if you have problems breathing or unusual sleepiness. Do not take other medicines that contain acetaminophen with this medicine. Always read labels carefully. If you  have questions, ask your doctor or pharmacist. If you take too much acetaminophen get medical help right away. Too much acetaminophen can be very dangerous and cause liver damage. Even if you do not have symptoms, it is important to get help right away. You may get drowsy or dizzy. Do not drive, use machinery, or do anything that needs mental alertness until you know how this medicine affects you. Do not stand or sit up quickly, especially if you are an older patient. This reduces the risk of dizzy or fainting spells. Alcohol may interfere with the effect of this medicine. Avoid alcoholic drinks. This medicine will cause constipation. Try to have a bowel movement at least every 2 to 3 days. If you do not have a bowel movement for 3 days, call your doctor or health care professional. Your mouth may get dry. Chewing sugarless gum or sucking hard candy, and drinking plenty of water may help. Contact your doctor if the problem does not go away or is severe. What side effects may I notice from receiving this medicine? Side effects that you should report to your doctor or health care professional as soon as possible: -allergic reactions like skin rash, itching or hives, swelling of the face, lips, or tongue -breathing problems -confusion -redness, blistering, peeling or loosening of the skin, including inside the mouth -seizures -signs and symptoms of low blood pressure like dizziness; feeling faint or lightheaded, falls; unusually weak or tired -trouble passing urine or change in the amount of urine -yellowing of the eyes or skin Side effects that usually do not require medical attention (report to your doctor or health care professional if they continue or are bothersome): -constipation -dry mouth -nausea, vomiting -tiredness This list may not describe all possible side effects. Call your doctor for medical advice about side effects. You may report side effects to FDA at 1-800-FDA-1088. Where should  I keep my medicine? Keep out of the reach of children. Tramadol is a morphine-like drug that can be abused. Keep your medicine in a safe place to protect it from theft. Do not share this medicine with anyone. Selling or giving away this medicine is dangerous and is against the law. This medicine may cause accidental overdose and death if it taken by other adults, children, or pets. Mix any unused medicine with a substance like cat litter or coffee grounds. Then throw the medicine away in a sealed container like a sealed bag or a coffee can with a lid. Do not use the medicine after the expiration date. Store at room temperature between 15 and 30 degrees C (59 and 86 degrees F). NOTE: This sheet is a summary. It may not cover all possible information. If you have questions about this medicine, talk to your doctor, pharmacist, or health  care provider.  2018 Elsevier/Gold Standard (2015-03-03 10:58:16)

## 2017-10-01 LAB — CMP14+EGFR
ALT: 12 IU/L (ref 0–44)
AST: 17 IU/L (ref 0–40)
Albumin/Globulin Ratio: 1.7 (ref 1.2–2.2)
Albumin: 4.3 g/dL (ref 3.6–4.8)
Alkaline Phosphatase: 74 IU/L (ref 39–117)
BUN/Creatinine Ratio: 12 (ref 10–24)
BUN: 13 mg/dL (ref 8–27)
Bilirubin Total: 0.3 mg/dL (ref 0.0–1.2)
CO2: 20 mmol/L (ref 20–29)
Calcium: 9.4 mg/dL (ref 8.6–10.2)
Chloride: 96 mmol/L (ref 96–106)
Creatinine, Ser: 1.11 mg/dL (ref 0.76–1.27)
GFR calc Af Amer: 80 mL/min/{1.73_m2} (ref >59)
GFR calc non Af Amer: 69 mL/min/{1.73_m2} (ref >59)
Globulin, Total: 2.5 g/dL (ref 1.5–4.5)
Glucose: 133 mg/dL — ABNORMAL HIGH (ref 65–99)
Potassium: 4.4 mmol/L (ref 3.5–5.2)
Sodium: 131 mmol/L — ABNORMAL LOW (ref 134–144)
Total Protein: 6.8 g/dL (ref 6.0–8.5)

## 2017-10-01 LAB — LIPID PANEL
Chol/HDL Ratio: 6.6 ratio — ABNORMAL HIGH (ref 0.0–5.0)
Cholesterol, Total: 218 mg/dL — ABNORMAL HIGH (ref 100–199)
HDL: 33 mg/dL — ABNORMAL LOW (ref >39)
Triglycerides: 485 mg/dL — ABNORMAL HIGH (ref 0–149)

## 2017-10-02 ENCOUNTER — Encounter: Payer: Self-pay | Admitting: Physician Assistant

## 2017-10-02 NOTE — Progress Notes (Signed)
Elevated cholesterol. Plan to recheck in 2 months and start statin.

## 2017-10-03 ENCOUNTER — Telehealth: Payer: Self-pay | Admitting: Physician Assistant

## 2017-10-03 NOTE — Telephone Encounter (Signed)
Pt. Is calling to let the doctor know that the medication prescribed for his back pain is not working. Pt. Asserts he was instructed by the PA to call the practice and inform them of this so she could call in a stronger medicine.  Best number [phone]

## 2017-10-06 ENCOUNTER — Other Ambulatory Visit: Payer: Self-pay | Admitting: Physician Assistant

## 2017-10-06 DIAGNOSIS — G8929 Other chronic pain: Secondary | ICD-10-CM

## 2017-10-06 DIAGNOSIS — M545 Low back pain, unspecified: Secondary | ICD-10-CM

## 2017-10-06 MED ORDER — OXYCODONE-ACETAMINOPHEN 10-325 MG PO TABS: 1.0000 | ORAL_TABLET | Freq: Three times a day (TID) | ORAL | 0 refills | Status: AC | PRN

## 2017-10-06 NOTE — Telephone Encounter (Signed)
Rx sent for new pain medication. Please inform pt he can pick this up at pharmacy. Medication may cause drowsiness or constipation. He may want to take stool softener while taking this. Thank you!

## 2017-10-07 ENCOUNTER — Ambulatory Visit (HOSPITAL_COMMUNITY)
Admission: RE | Admit: 2017-10-07 | Discharge: 2017-10-07 | Disposition: A | Discharge status: Home / Self Care | Payer: Medicare Other | Source: Ambulatory Visit | Attending: Cardiology | Admitting: Cardiology

## 2017-10-07 DIAGNOSIS — Z959 Presence of cardiac and vascular implant and graft, unspecified: Secondary | ICD-10-CM | POA: Diagnosis not present

## 2017-10-07 DIAGNOSIS — M549 Dorsalgia, unspecified: Secondary | ICD-10-CM | POA: Insufficient documentation

## 2017-10-07 DIAGNOSIS — I6523 Occlusion and stenosis of bilateral carotid arteries: Secondary | ICD-10-CM | POA: Diagnosis not present

## 2017-10-07 DIAGNOSIS — T82858A Stenosis of vascular prosthetic devices, implants and grafts, initial encounter: Secondary | ICD-10-CM | POA: Insufficient documentation

## 2017-10-07 NOTE — Telephone Encounter (Signed)
Phone call to patient. Unable to reach. If patient calls back, please relay message from [place], [place] below.

## 2017-10-15 ENCOUNTER — Other Ambulatory Visit: Payer: Self-pay | Admitting: Cardiovascular Disease

## 2017-10-19 ENCOUNTER — Other Ambulatory Visit: Payer: Self-pay | Admitting: Cardiovascular Disease

## 2017-10-19 NOTE — Telephone Encounter (Signed)
REFILL 

## 2017-10-24 ENCOUNTER — Other Ambulatory Visit: Payer: Self-pay

## 2017-10-24 ENCOUNTER — Telehealth: Payer: Self-pay | Admitting: Student

## 2017-10-24 DIAGNOSIS — M545 Low back pain, unspecified: Secondary | ICD-10-CM

## 2017-10-24 DIAGNOSIS — G8929 Other chronic pain: Secondary | ICD-10-CM

## 2017-10-24 NOTE — Telephone Encounter (Signed)
Please advise 

## 2017-10-24 NOTE — Telephone Encounter (Signed)
Pt. Called to request refill or new script of Oxycodone-acetaminophen

## 2017-10-26 ENCOUNTER — Other Ambulatory Visit: Payer: Self-pay | Admitting: Physician Assistant

## 2017-10-26 DIAGNOSIS — G8929 Other chronic pain: Secondary | ICD-10-CM

## 2017-10-26 DIAGNOSIS — M5442 Lumbago with sciatica, left side: Principal | ICD-10-CM

## 2017-10-26 MED ORDER — TRAMADOL-ACETAMINOPHEN 37.5-325 MG PO TABS: 1.0000 | ORAL_TABLET | Freq: Four times a day (QID) | ORAL | 1 refills | Status: DC | PRN

## 2017-10-27 ENCOUNTER — Ambulatory Visit (INDEPENDENT_AMBULATORY_CARE_PROVIDER_SITE_OTHER): Payer: Medicare Other | Admitting: Orthopaedic Surgery

## 2017-10-27 ENCOUNTER — Encounter (INDEPENDENT_AMBULATORY_CARE_PROVIDER_SITE_OTHER): Payer: Self-pay | Admitting: Orthopaedic Surgery

## 2017-10-27 VITALS — BP 155/80 | HR 85 | Ht 69.0 in | Wt 166.0 lb

## 2017-10-27 DIAGNOSIS — M4807 Spinal stenosis, lumbosacral region: Secondary | ICD-10-CM

## 2017-10-27 DIAGNOSIS — I6523 Occlusion and stenosis of bilateral carotid arteries: Secondary | ICD-10-CM

## 2017-10-27 DIAGNOSIS — Z981 Arthrodesis status: Secondary | ICD-10-CM | POA: Insufficient documentation

## 2017-10-27 MED ORDER — DIAZEPAM 5 MG PO TABS: ORAL_TABLET | ORAL | 0 refills | Status: DC

## 2017-10-27 NOTE — Progress Notes (Signed)
Office Visit Note   Patient: Jason [name]           Date of Birth: 27-Mar-1951           MRN: [id number] Visit Date: 10/27/2017              Requested by: Dorise Hiss, PA-C [place], [place] [address] PCP: Patient, [name]   Assessment & Plan: Visit Diagnoses:  1. Status post lumbar spinal fusion   2. Spinal stenosis of lumbosacral region     Plan: Patient has satisfactory x-rays postop to level fusion.  Multilevel degenerative changes in the upper lumbar spine and lower thoracic region with disc space narrowing and spurring.  A few millimeters of retrolisthesis at L3-4 one level above the fusion.  Due to his chronic pain problems I recommend proceeding with a MRI scan to evaluate the 3 4 level with more symptoms on the left than right.  Office follow-up after imaging.  Patient has complex medical history with history of heart problems pulmonary problems peripheral arterial disease, diabetes hypertension, previous CVA.  Follow-up for MRI review.  Follow-Up Instructions: No follow-ups on file.   Orders:  Orders Placed This Encounter  Procedures  . MR Lumbar Spine w/o contrast   Meds ordered this encounter  Medications  . diazepam (VALIUM) 5 MG tablet    Sig: Take one tablet one hour prior to procedure. Repeat if needed.    Dispense:  2 tablet    Refill:  0      Procedures: No procedures performed   Clinical Data: No additional findings.   Subjective: Chief Complaint  Patient presents with  . Lower Back - Pain    HPI 67 year old male with chronic low back pain present for years, severe in the last 3 months.  Previous 2 level lumbar fusion by Dr. Vertell Limber on 07/16/2012 L4-S1 for an on-the-job injury which has been closed and patient has been rated.  Patient states he has trouble bending problems standing sometimes cannot walk in an upright position.  He is taken tramadol every 6 hours.  Past history of CABG procedure with stroke.  Diabetes  hypertension.  Acute on chronic systolic and diastolic heart failure.  Patient states he gets relief with sitting.  Problems walking in a grocery store does better leaning on a cart.  He can only stand for about 5 minutes.  Review of Systems 14 point review of systems positive for diabetes, hypertension hyperlipidemia, previous CABG 2011 cath and then bypass x4.  History of femoral endarterectomy 2004 tobacco use carotid stenosis, substance abuse in remission, CVA after CABG.  Lumbar microdiscectomy.  2 level lumbar fusion 07/16/2012.   Objective: Vital Signs: BP (!) 155/80 (BP Location: Left Arm, Patient Position: Sitting, Cuff Size: Normal)   Pulse 85   Ht 5\' 9"  (1.753 m)   Wt 166 lb (75.3 kg)   BMI 24.51 kg/m   Physical Exam  Constitutional: He is oriented to person, place, and time. He appears well-developed and well-nourished.  HENT:  Head: Normocephalic and atraumatic.  Eyes: Pupils are equal, round, and reactive to light. EOM are normal.  Neck: No tracheal deviation present. No thyromegaly present.  Cardiovascular: Normal rate.  Pulmonary/Chest: Effort normal. He has no wheezes.  Abdominal: Soft. Bowel sounds are normal.  Neurological: He is alert and oriented to person, place, and time.  Skin: Skin is warm and dry. Capillary refill takes less than 2 seconds.  Psychiatric: He has a normal  mood and affect. His behavior is normal. Judgment and thought content normal.    Ortho Exam patient has pain with straight leg raising well-healed lumbar incision there is some sciatic notch tenderness.  Knee and ankle jerk are walks forward flexed.   Specialty Comments:  No specialty comments available.  Imaging: CLINICAL DATA:  Acute on chronic back pain, initial encounter  EXAM: LUMBAR SPINE - COMPLETE 4+ VIEW  COMPARISON:  12/06/2014  FINDINGS: Postsurgical changes are noted at L4-5 and L5-S1 with interbody fusion and posterior fixation. Vertebral body height is  well maintained. Multilevel disc space narrowing is noted from T12-L4. This has increased somewhat in the interval from the prior exam particularly at L2-3 and L3-4. No antero or retrolisthesis is noted. Diffuse aortic calcifications are seen.  IMPRESSION: Multilevel degenerative change as well as postsurgical changes as described somewhat progressed when compared with the prior exam.   Electronically Signed   By: Inez Catalina M.D.   On: 09/30/2017 15:01   PMFS History: Patient Active Problem List   Diagnosis Date Noted  . Acute on chronic combined systolic and diastolic CHF (congestive heart failure) ([place]) 10/25/2016  . NSTEMI (non-ST elevated myocardial infarction) ([place]) 10/22/2016  . Hypothyroidism 08/17/2015  . RBBB 05/11/2015  . Numbness of hand   . Hyponatremia 04/04/2015  . Hypotension 04/04/2015  . Hemispheric carotid artery syndrome   . Carotid artery narrowing 03/28/2015  . Carotid stenosis- moderate 2011, 95% 2016 s/p stent 02/21/2014  . Unstable angina ([place]) 01/16/2014  . Tobacco abuse 01/16/2014  . Chest pain 07/08/2013  . Substance abuse in remission ([place]) 10/01/2012  . Radiculitis 02/10/2012  . CAD, CABG Feb 2011, cath x 4 since-medical Rx 10/03/2011  . PVD, hx Rt femoral endarterectomy 2004 10/03/2011  . DM (diabetes mellitus) ([place]) 10/02/2011  . HTN (hypertension) 10/02/2011  . Hyperlipidemia 10/02/2011   Past Medical History:  Diagnosis Date  . Anxiety    off xanax  and paxil since 3/13  . Arthritis   . Bilateral carotid artery disease ([place])    s/p R ICA stent 03/28/2015 with distal protection. Known chronically occluded L ICA. Carotid stent complicated by hypotension and acute stroke [place]  . Cancer ([place])    tumor basal cell rem from lft arm  . Cataract   . COPD (chronic obstructive pulmonary disease) ([place])   . Coronary artery disease    a. s/p CABG in 2011 with LIMA-LAD, SVG-RI/OM, and SVG-PDA b. occluded SVG-PDA by cath in  2015 c. 10/2016: NSTEMI with cath showing thrombus along the distal graft to insertion of SVG-OM2 with DES placed.   . CVA (cerebral infarction)    occured on 10/10 several days after R ICA carotid stenting  . Depression   . Diabetes mellitus   . GERD (gastroesophageal reflux disease)   . Heart attack ([place])   . High cholesterol   . Hypertension    type 2 NIDDM  . Myocardial infarct (H[facility])    x4 last 10 yrs  . Pneumonia    hx  . 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 Smoker   . Substance abuse ([place])     Family History  Problem Relation Age of Onset  . Arthritis Mother   . Heart disease Father     Past Surgical History:  Procedure Laterality Date  . BACK SURGERY  Jan 2014, Dec 2011   Dr Vertell Limber  . CARDIAC CATHETERIZATION  4/12   Medical Rx  .  CORONARY ANGIOGRAM  01/17/14   med rx  . CORONARY ANGIOGRAM  4/13   med Rx  . CORONARY ANGIOGRAM  1/15   Med Rx  . CORONARY ANGIOPLASTY  Jan 2004   RCA  . CORONARY ARTERY BYPASS GRAFT  07/24/2009   L-LAD, SVG-RI/OM, SVG-PDA  . CORONARY STENT INTERVENTION N/A 10/23/2016   Procedure: Coronary Stent Intervention;  Surgeon: Peter M Martinique, MD;  Location: [facility];  Service: Cardiovascular;  Laterality: N/A;  . EYE SURGERY     cat bil  . FEMORAL ARTERY - FEMORAL ARTERY BYPASS GRAFT Right 2004   femoral enarterectomy  . LEFT HEART CATH AND CORS/GRAFTS ANGIOGRAPHY N/A 10/23/2016   Procedure: Left Heart Cath and Cors/Grafts Angiography;  Surgeon: Peter M Martinique, MD;  Location: [facility];  Service: Cardiovascular;  Laterality: N/A;  . LEFT HEART CATHETERIZATION WITH CORONARY ANGIOGRAM N/A 10/03/2011   Procedure: LEFT HEART CATHETERIZATION WITH CORONARY ANGIOGRAM;  Surgeon: Pixie Casino, MD;  Location[facility];  Service: Cardiovascular;  Laterality: N/A;  . LEFT HEART CATHETERIZATION WITH CORONARY ANGIOGRAM N/A 07/08/2013   Procedure: LEFT HEART CATHETERIZATION WITH CORONARY ANGIOGRAM;   Surgeon: Blane Ohara, MD;  Location[facility] C[facility];  Service: Cardiovascular;  Laterality: N/A;  . LEFT HEART CATHETERIZATION WITH CORONARY/GRAFT ANGIOGRAM N/A 01/17/2014   Procedure: LEFT HEART CATHETERIZATION WITH Beatrix Fetters;  Surgeon: Troy Sine, MD;  Location: [facility] CATH LAB;  Service: Cardiovascular;  Laterality: N/A;  . PERIPHERAL VASCULAR CATHETERIZATION N/A 03/28/2015   Procedure: Carotid PTA/Stent Intervention;  Surgeon: Lorretta Harp, MD;  Location: [facility];  Service: Cardiovascular;  Laterality: N/A;  . US EXTREMITY*L*     lft arm tumor removed    Social History   Occupational History  . Occupation: welder-fabricator  Tobacco Use  . Smoking status: Current Every Day Smoker    Packs/day: 1.00    Years: 48.00    Pack years: 48.00    Types: Cigarettes  . Smokeless tobacco: Never Used  Substance and Sexual Activity  . Alcohol use: Yes    Alcohol/week: 0.0 oz    Comment: socially  . Drug use: No    Comment: hx of cocaine use  . Sexual activity: Not Currently

## 2017-11-10 ENCOUNTER — Telehealth: Payer: Self-pay | Admitting: Physician Assistant

## 2017-11-10 NOTE — Telephone Encounter (Signed)
Pt came into the office to see if McVey could order a refill of his pain med (tramadol). I looked at the chart and saw that she ordered a refill on 10/26/17. I let the pt know that McVey may want to see him but I couldn't be sure or she may send in a refill. Please call pt and advise if he needs to make an appt of if the refill has been placed.   Best callback # [phone]

## 2017-11-12 ENCOUNTER — Ambulatory Visit
Admission: RE | Admit: 2017-11-12 | Discharge: 2017-11-12 | Disposition: A | Discharge status: Home / Self Care | Payer: Medicare Other | Source: Ambulatory Visit | Attending: Orthopaedic Surgery | Admitting: Orthopaedic Surgery

## 2017-11-12 DIAGNOSIS — M4807 Spinal stenosis, lumbosacral region: Secondary | ICD-10-CM

## 2017-11-12 DIAGNOSIS — M48061 Spinal stenosis, lumbar region without neurogenic claudication: Secondary | ICD-10-CM | POA: Diagnosis not present

## 2017-11-17 ENCOUNTER — Ambulatory Visit (INDEPENDENT_AMBULATORY_CARE_PROVIDER_SITE_OTHER): Payer: Medicare Other | Admitting: Orthopaedic Surgery

## 2017-11-17 ENCOUNTER — Other Ambulatory Visit: Payer: Self-pay | Admitting: Physician Assistant

## 2017-11-17 DIAGNOSIS — G8929 Other chronic pain: Secondary | ICD-10-CM

## 2017-11-17 DIAGNOSIS — M5442 Lumbago with sciatica, left side: Principal | ICD-10-CM

## 2017-11-17 MED ORDER — TRAMADOL-ACETAMINOPHEN 37.5-325 MG PO TABS: 1.0000 | ORAL_TABLET | Freq: Four times a day (QID) | ORAL | 0 refills | Status: DC | PRN

## 2017-11-17 NOTE — Telephone Encounter (Signed)
OK to refill. He is being evaluated by ortho. Recent MR spine with stenosis. Will send in Rx to pharmacy. Please let pt know. He does not have to come see me.

## 2017-11-17 NOTE — Telephone Encounter (Signed)
Left detailed message.   

## 2017-11-21 ENCOUNTER — Other Ambulatory Visit: Payer: Self-pay | Admitting: Physician Assistant

## 2017-11-21 DIAGNOSIS — F411 Generalized anxiety disorder: Secondary | ICD-10-CM

## 2017-11-23 NOTE — Telephone Encounter (Signed)
sertraline refill Last Refill:09/24/17 # 30 1 RF Last OV: 09/30/17 PCP: Mercer Pod PA Pharmacy:[facility] [address].

## 2017-11-25 ENCOUNTER — Other Ambulatory Visit: Payer: Self-pay | Admitting: Physician Assistant

## 2017-11-25 ENCOUNTER — Other Ambulatory Visit: Payer: Self-pay | Admitting: Cardiovascular Disease

## 2017-11-25 DIAGNOSIS — E785 Hyperlipidemia, unspecified: Secondary | ICD-10-CM

## 2017-11-25 MED ORDER — ATORVASTATIN CALCIUM 80 MG PO TABS: 80.0000 mg | ORAL_TABLET | Freq: Every day | ORAL | 5 refills | Status: DC

## 2017-11-25 NOTE — Telephone Encounter (Signed)
Rx sent to pharmacy   

## 2017-11-27 ENCOUNTER — Other Ambulatory Visit: Payer: Self-pay | Admitting: *Deleted

## 2017-11-27 DIAGNOSIS — I6523 Occlusion and stenosis of bilateral carotid arteries: Secondary | ICD-10-CM

## 2017-12-01 ENCOUNTER — Encounter (INDEPENDENT_AMBULATORY_CARE_PROVIDER_SITE_OTHER): Payer: Self-pay | Admitting: Orthopaedic Surgery

## 2017-12-01 ENCOUNTER — Ambulatory Visit (INDEPENDENT_AMBULATORY_CARE_PROVIDER_SITE_OTHER): Payer: Medicare Other | Admitting: Orthopaedic Surgery

## 2017-12-01 VITALS — BP 155/73 | HR 90 | Ht 68.0 in | Wt 160.0 lb

## 2017-12-01 DIAGNOSIS — M48062 Spinal stenosis, lumbar region with neurogenic claudication: Secondary | ICD-10-CM

## 2017-12-01 DIAGNOSIS — I6523 Occlusion and stenosis of bilateral carotid arteries: Secondary | ICD-10-CM | POA: Diagnosis not present

## 2017-12-01 DIAGNOSIS — M5126 Other intervertebral disc displacement, lumbar region: Secondary | ICD-10-CM | POA: Diagnosis not present

## 2017-12-01 DIAGNOSIS — Z981 Arthrodesis status: Secondary | ICD-10-CM

## 2017-12-01 NOTE — Progress Notes (Signed)
Office Visit Note   Patient: Jason [name]           Date of Birth: [birth year]           MRN: [id number] Visit Date: 12/01/2017              Requested by: No referring provider defined for this encounter. PCP: Patient, [name]   Assessment & Plan: Visit Diagnoses:  1. Status post lumbar spinal fusion   2. Herniated intervertebral disc of lumbar spine   3. Spinal stenosis of lumbar region with neurogenic claudication     Plan: I discussed the patient needs to cut back on his smoking.  He needs to improve his A1c from the 9.1 level and get him down to at least 7.5.  I will check him back again in 2 months and if he is reached his goals we will see if cardiology would allow him to proceed with a microdiscectomy.  He has a large disc rupture at L3-4 causing severe stenosis with neurogenic claudication symptoms.  This is above his old fusion but with his multiple medical problems including heart disease I do not think he is a candidate for adjacent level fusion and would do better with a simple microdiscectomy with overnight stay in the hospital on cardiac monitoring floor.  He lives by himself he have to find some and he could stay with him or he can stay with him for couple days after the surgery.  He might use a walker for a few days or a cane after the surgery.  Recheck 2 months and hopefully by then he has had improvement in his A1c.  Follow-Up Instructions: Return in about 2 months (around 01/31/2018).   Orders:  No orders of the defined types were placed in this encounter.  No orders of the defined types were placed in this encounter.     Procedures: No procedures performed   Clinical Data: No additional findings.   Subjective: Chief Complaint  Patient presents with  . Lower Back - Pain, Follow-up    MRI Lumbar review    HPI 67 year old male returns with chronic back pain worse x4 months with difficulty standing or walking more than 5 minutes.  He does better  when he leans on a grocery cart.  He has extensive medical history including diabetes hypertension, CABG times 4 and 2011.  Heart failure, femoral endarterectomy, substance abuse in remission, previous CVA.  2 level lumbar fusion by Dr. Vertell Limber in 2014.  Patient states he has to sit down after he cooks for about 2 minutes and he uses a chair as he can stand and then sit on a stool and then stand back up twice trying to prepare food.  Review of Systems 14 point review of systems updated from 10/27/2017.  He has multisystem involvement including previous femoral endarterectomy, continued tobacco use, diabetes with poor control A1c was 9.12 months ago.  He continues to smoke, hypertension, hyperlipidemia, CABG x4 after cardiac catheterization 2011.  Some chronic systolic and diastolic heart failure with satisfactory ejection fraction.   Objective: Vital Signs: BP (!) 155/73   Pulse 90   Ht 5\' 8"  (1.727 m)   Wt 160 lb (72.6 kg)   BMI 24.33 kg/m   Physical Exam  Constitutional: He is oriented to person, place, and time. He appears well-developed and well-nourished.  HENT:  Head: Normocephalic and atraumatic.  Eyes: Pupils are equal, round, and reactive to light. EOM are normal.  Neck:  No tracheal deviation present. No thyromegaly present.  Cardiovascular: Normal rate.  Pulmonary/Chest: Effort normal. He has no wheezes.  Abdominal: Soft. Bowel sounds are normal.  Neurological: He is alert and oriented to person, place, and time.  Skin: Skin is warm and dry. Capillary refill takes less than 2 seconds.  Psychiatric: He has a normal mood and affect. His behavior is normal. Judgment and thought content normal.    Ortho Exam patient ambulates with slightly widespread gait hips and external rotation slightly flexed forward at the waist.  Well-healed lumbar incision.  He has some sciatic notch tenderness.  Some pain with straight leg raising on the left at 80 degrees.  Intact knee and ankle  jerk.  Specialty Comments:  No specialty comments available.  Imaging: CLINICAL DATA:  Recurrent low back pain radiating to buttock and bilateral lower extremities with LEFT leg numbness. History of lumbar spine for surgery 4 years ago.  EXAM: MRI LUMBAR SPINE WITHOUT CONTRAST  TECHNIQUE: Multiplanar, multisequence MR imaging of the lumbar spine was performed. No intravenous contrast was administered.  COMPARISON:  Lumbar spine radiographs September 30, 2017  FINDINGS: SEGMENTATION: For the purposes of this report, the last well-formed intervertebral disc is reported as L5-S1.  ALIGNMENT: Maintained lumbar lordosis. Minimal grade 1 L2-3 and L3-4 retrolisthesis. Grade 1 L5-S1 anterolisthesis.  VERTEBRAE:Vertebral bodies are intact. Status post L4 through S1 PLIF, limited assessment for arthrodesis by MRI however no fluid signal within the discs. Severe T12-L1 through L2-3 disc height loss with moderate to severe chronic discogenic endplate changes, minimal acute upon L2-3. Moderate to severe L3-4 disc height loss with moderate acute discogenic endplate changes and focal disc edema. Scattered old Schmorl's nodes.  CONUS MEDULLARIS AND CAUDA EQUINA: Conus medullaris terminates at L1 and demonstrates normal morphology and signal characteristics. Central displacement of cauda equina due to canal stenosis.  PARASPINAL AND OTHER SOFT TISSUES: Nonacute. Paraspinal muscle atrophy at and below the level surgical intervention.  DISC LEVELS:  T11-12: Small broad-based disc bulge, mild facet arthropathy. Moderate canal stenosis. No neural foraminal narrowing.  T12-L1: Small broad-based disc bulge, mild facet arthropathy and ligamentum flavum redundancy. No canal stenosis or neural foraminal narrowing.  L1-2: 6 mm broad-based disc bulge asymmetric to the RIGHT could affect the exited RIGHT L1 nerve. Mild facet arthropathy and ligamentum flavum redundancy. Moderate canal  stenosis. Mild to moderate RIGHT neural foraminal narrowing.  L2-3: Retrolisthesis. Small broad-based disc bulge. Mild facet arthropathy and ligamentum flavum redundancy. Mild canal stenosis. Moderate LEFT greater than RIGHT neural foraminal narrowing.  L3-4: Retrolisthesis. 10 x 16 mm (AP by CC) central disc extrusion with 8 mm superior and inferior contiguous migration. Retrolisthesis. Severe canal stenosis, AP dimension of the thecal sac is 7 mm. Lateral recess effacement displacing the traversing L4 nerves. Moderate to severe RIGHT, severe LEFT neural foraminal narrowing.  L4-5: PLIF, posterior decompression without canal stenosis or neural foraminal narrowing.  L5-S1: Anterolisthesis. PLIF. No canal stenosis. Severe RIGHT neural foraminal narrowing may be overestimated by hardware artifact. Mild LEFT neural foraminal narrowing.  IMPRESSION: 1. Status post L4 through S1 PLIF. Adjacent segment disease with large L3-4 contiguous disc extrusion resulting in severe canal stenosis and traversing L4 nerve impingement. 2. Multilevel grade 1 spondylolisthesis. 3. Moderate canal stenosis T11-12, L1-2.  Mild canal stenosis L2-3. 4. Multilevel neural foraminal narrowing: Severe on the LEFT at L3-4.   Electronically Signed   By: Elon Alas M.D.   On: 11/13/2017 02:55        PMFS History:  Patient Active Problem List   Diagnosis Date Noted  . Status post lumbar spinal fusion 10/27/2017  . Acute on chronic combined systolic and diastolic CHF (congestive heart failure) ([place]) 10/25/2016  . NSTEMI (non-ST elevated myocardial infarction) ([place]) 10/22/2016  . Hypothyroidism 08/17/2015  . RBBB 05/11/2015  . Numbness of hand   . Hyponatremia 04/04/2015  . Hypotension 04/04/2015  . Hemispheric carotid artery syndrome   . Carotid artery narrowing 03/28/2015  . Carotid stenosis- moderate 2011, 95% 2016 s/p stent 02/21/2014  . Unstable angina ([place]) 01/16/2014  . Tobacco  abuse 01/16/2014  . Chest pain 07/08/2013  . Substance abuse in remission ([place]) 10/01/2012  . Radiculitis 02/10/2012  . CAD, CABG Feb 2011, cath x 4 since-medical Rx 10/03/2011  . PVD, hx Rt femoral endarterectomy 2004 10/03/2011  . DM (diabetes mellitus) ([place]) 10/02/2011  . HTN (hypertension) 10/02/2011  . Hyperlipidemia 10/02/2011   Past Medical History:  Diagnosis Date  . Anxiety    off xanax  and paxil since 3/13  . Arthritis   . Bilateral carotid artery disease ([place])    s/p R ICA stent 03/28/2015 with distal protection. Known chronically occluded L ICA. Carotid stent complicated by hypotension and acute stroke in [place]  . Cancer ([place])    tumor basal cell rem from lft arm  . Cataract   . COPD (chronic obstructive pulmonary disease) ([place])   . Coronary artery disease    a. s/p CABG in 2011 with LIMA-LAD, SVG-RI/OM, and SVG-PDA b. occluded SVG-PDA by cath in 2015 c. 10/2016: NSTEMI with cath showing thrombus along the distal graft to insertion of SVG-OM2 with DES placed.   . CVA (cerebral infarction)    occured on 10/10 several days after R ICA carotid stenting  . Depression   . Diabetes mellitus   . GERD (gastroesophageal reflux disease)   . Heart attack ([place])   . High cholesterol   . Hypertension    type 2 NIDDM  . Myocardial infarct ([facility])    x4 last 10 yrs  . Pneumonia    hx  . 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 Smoker   . Substance abuse ([place])     Family History  Problem Relation Age of Onset  . Arthritis Mother   . Heart disease Father     Past Surgical History:  Procedure Laterality Date  . BACK SURGERY  Jan 2014, Dec 2011   Dr Vertell Limber  . CARDIAC CATHETERIZATION  4/12   Medical Rx  . CORONARY ANGIOGRAM  01/17/14   med rx  . CORONARY ANGIOGRAM  4/13   med Rx  . CORONARY ANGIOGRAM  1/15   Med Rx  . CORONARY ANGIOPLASTY  Jan 2004   RCA  . CORONARY ARTERY BYPASS GRAFT  07/24/2009   L-LAD, SVG-RI/OM, SVG-PDA   . CORONARY STENT INTERVENTION N/A 10/23/2016   Procedure: Coronary Stent Intervention;  Surgeon: Peter M Martinique, MD;  Location: [facility];  Service: Cardiovascular;  Laterality: N/A;  . EYE SURGERY     cat bil  . FEMORAL ARTERY - FEMORAL ARTERY BYPASS GRAFT Right 2004   femoral enarterectomy  . LEFT HEART CATH AND CORS/GRAFTS ANGIOGRAPHY N/A 10/23/2016   Procedure: Left Heart Cath and Cors/Grafts Angiography;  Surgeon: Peter M Martinique, MD;  Location: [place] [facility];  Service: Cardiovascular;  Laterality: N/A;  . LEFT HEART CATHETERIZATION WITH CORONARY ANGIOGRAM N/A 10/03/2011   Procedure: LEFT HEART CATHETERIZATION WITH CORONARY ANGIOGRAM;  Surgeon: Pixie Casino, MD;  Location: [facility] CATH LAB;  Service: Cardiovascular;  Laterality: N/A;  . LEFT HEART CATHETERIZATION WITH CORONARY ANGIOGRAM N/A 07/08/2013   Procedure: LEFT HEART CATHETERIZATION WITH CORONARY ANGIOGRAM;  Surgeon: Blane Ohara, MD;  Location: [facility] CATH LAB;  Service: Cardiovascular;  Laterality: N/A;  . LEFT HEART CATHETERIZATION WITH CORONARY/GRAFT ANGIOGRAM N/A 01/17/2014   Procedure: LEFT HEART CATHETERIZATION WITH Beatrix Fetters;  Surgeon: Troy Sine, MD;  Location: [facility] CATH LAB;  Service: Cardiovascular;  Laterality: N/A;  . PERIPHERAL VASCULAR CATHETERIZATION N/A 03/28/2015   Procedure: Carotid PTA/Stent Intervention;  Surgeon: Lorretta Harp, MD;  Location: [place] [facility];  Service: Cardiovascular;  Laterality: N/A;  . US EXTREMITY*L*     lft arm tumor removed    Social History   Occupational History  . Occupation: welder-fabricator  Tobacco Use  . Smoking status: Current Every Day Smoker    Packs/day: 1.00    Years: 48.00    Pack years: 48.00    Types: Cigarettes  . Smokeless tobacco: Never Used  Substance and Sexual Activity  . Alcohol use: Yes    Alcohol/week: 0.0 oz    Comment: socially  . Drug use: No    Comment: hx of cocaine use  . Sexual activity: Not Currently

## 2017-12-07 ENCOUNTER — Other Ambulatory Visit: Payer: Self-pay | Admitting: Cardiovascular Disease

## 2017-12-09 ENCOUNTER — Other Ambulatory Visit: Payer: Self-pay | Admitting: Family Medicine

## 2017-12-09 DIAGNOSIS — E118 Type 2 diabetes mellitus with unspecified complications: Secondary | ICD-10-CM

## 2017-12-21 ENCOUNTER — Other Ambulatory Visit: Payer: Self-pay | Admitting: Physician Assistant

## 2017-12-21 DIAGNOSIS — F411 Generalized anxiety disorder: Secondary | ICD-10-CM

## 2017-12-21 NOTE — Telephone Encounter (Signed)
Left VM re: refill request, needs appt.

## 2017-12-28 ENCOUNTER — Ambulatory Visit: Payer: Medicare Other | Admitting: Physician Assistant

## 2017-12-30 ENCOUNTER — Other Ambulatory Visit: Payer: Self-pay

## 2017-12-30 ENCOUNTER — Ambulatory Visit (INDEPENDENT_AMBULATORY_CARE_PROVIDER_SITE_OTHER): Payer: Medicare Other | Admitting: Physician Assistant

## 2017-12-30 ENCOUNTER — Encounter: Payer: Self-pay | Admitting: Physician Assistant

## 2017-12-30 VITALS — BP 130/72 | HR 86 | Temp 98.1°F | Resp 16 | Ht 67.0 in | Wt 166.4 lb

## 2017-12-30 DIAGNOSIS — F329 Major depressive disorder, single episode, unspecified: Secondary | ICD-10-CM | POA: Diagnosis not present

## 2017-12-30 DIAGNOSIS — F411 Generalized anxiety disorder: Secondary | ICD-10-CM

## 2017-12-30 DIAGNOSIS — Z794 Long term (current) use of insulin: Secondary | ICD-10-CM

## 2017-12-30 DIAGNOSIS — F32A Depression, unspecified: Secondary | ICD-10-CM

## 2017-12-30 DIAGNOSIS — E1159 Type 2 diabetes mellitus with other circulatory complications: Secondary | ICD-10-CM

## 2017-12-30 DIAGNOSIS — E119 Type 2 diabetes mellitus without complications: Secondary | ICD-10-CM | POA: Diagnosis not present

## 2017-12-30 LAB — POCT GLYCOSYLATED HEMOGLOBIN (HGB A1C): Hemoglobin A1C: 6.2 % — AB (ref 4.0–5.6)

## 2017-12-30 MED ORDER — METFORMIN HCL 1000 MG PO TABS: ORAL_TABLET | ORAL | 3 refills | Status: DC

## 2017-12-30 MED ORDER — SERTRALINE HCL 100 MG PO TABS: 200.0000 mg | ORAL_TABLET | Freq: Every day | ORAL | 3 refills | Status: DC

## 2017-12-30 NOTE — Patient Instructions (Addendum)
Great news! Your A1C is 6.2!! Keep up the great work.   Start taking Zoloft '20mg'$  daily.   Come back and see me in 3 months for your annual exam.    Diabetes Mellitus and Nutrition When you have diabetes (diabetes mellitus), it is very important to have healthy eating habits because your blood sugar (glucose) levels are greatly affected by what you eat and drink. Eating healthy foods in the appropriate amounts, at about the same times every day, can help you:  Control your blood glucose.  Lower your risk of heart disease.  Improve your blood pressure.  Reach or maintain a healthy weight.  Every person with diabetes is different, and each person has different needs for a meal plan. Your health care provider may recommend that you work with a diet and nutrition specialist (dietitian) to make a meal plan that is best for you. Your meal plan may vary depending on factors such as:  The calories you need.  The medicines you take.  Your weight.  Your blood glucose, blood pressure, and cholesterol levels.  Your activity level.  Other health conditions you have, such as heart or kidney disease.  How do carbohydrates affect me? Carbohydrates affect your blood glucose level more than any other type of food. Eating carbohydrates naturally increases the amount of glucose in your blood. Carbohydrate counting is a method for keeping track of how many carbohydrates you eat. Counting carbohydrates is important to keep your blood glucose at a healthy level, especially if you use insulin or take certain oral diabetes medicines. It is important to know how many carbohydrates you can safely have in each meal. This is different for every person. Your dietitian can help you calculate how many carbohydrates you should have at each meal and for snack. Foods that contain carbohydrates include:  Bread, cereal, rice, pasta, and crackers.  Potatoes and corn.  Peas, beans, and lentils.  Milk and  yogurt.  Fruit and juice.  Desserts, such as cakes, cookies, ice cream, and candy.  How does alcohol affect me? Alcohol can cause a sudden decrease in blood glucose (hypoglycemia), especially if you use insulin or take certain oral diabetes medicines. Hypoglycemia can be a life-threatening condition. Symptoms of hypoglycemia (sleepiness, dizziness, and confusion) are similar to symptoms of having too much alcohol. If your health care provider says that alcohol is safe for you, follow these guidelines:  Limit alcohol intake to no more than 1 drink per day for nonpregnant women and 2 drinks per day for men. One drink equals 12 oz of beer, 5 oz of wine, or 1 oz of hard liquor.  Do not drink on an empty stomach.  Keep yourself hydrated with water, diet soda, or unsweetened iced tea.  Keep in mind that regular soda, juice, and other mixers may contain a lot of sugar and must be counted as carbohydrates.  What are tips for following this plan? Reading food labels  Start by checking the serving size on the label. The amount of calories, carbohydrates, fats, and other nutrients listed on the label are based on one serving of the food. Many foods contain more than one serving per package.  Check the total grams (g) of carbohydrates in one serving. You can calculate the number of servings of carbohydrates in one serving by dividing the total carbohydrates by 15. For example, if a food has 30 g of total carbohydrates, it would be equal to 2 servings of carbohydrates.  Check the number of  grams (g) of saturated and trans fats in one serving. Choose foods that have low or no amount of these fats.  Check the number of milligrams (mg) of sodium in one serving. Most people should limit total sodium intake to less than 2,300 mg per day.  Always check the nutrition information of foods labeled as "low-fat" or "nonfat". These foods may be higher in added sugar or refined carbohydrates and should be  avoided.  Talk to your dietitian to identify your daily goals for nutrients listed on the label. Shopping  Avoid buying canned, premade, or processed foods. These foods tend to be high in fat, sodium, and added sugar.  Shop around the outside edge of the grocery store. This includes fresh fruits and vegetables, bulk grains, fresh meats, and fresh dairy. Cooking  Use low-heat cooking methods, such as baking, instead of high-heat cooking methods like deep frying.  Cook using healthy oils, such as olive, canola, or sunflower oil.  Avoid cooking with butter, cream, or high-fat meats. Meal planning  Eat meals and snacks regularly, preferably at the same times every day. Avoid going long periods of time without eating.  Eat foods high in fiber, such as fresh fruits, vegetables, beans, and whole grains. Talk to your dietitian about how many servings of carbohydrates you can eat at each meal.  Eat 4-6 ounces of lean protein each day, such as lean meat, chicken, fish, eggs, or tofu. 1 ounce is equal to 1 ounce of meat, chicken, or fish, 1 egg, or 1/4 cup of tofu.  Eat some foods each day that contain healthy fats, such as avocado, nuts, seeds, and fish. Lifestyle   Check your blood glucose regularly.  Exercise at least 30 minutes 5 or more days each week, or as told by your health care provider.  Take medicines as told by your health care provider.  Do not use any products that contain nicotine or tobacco, such as cigarettes and e-cigarettes. If you need help quitting, ask your health care provider.  Work with a Social worker or diabetes educator to identify strategies to manage stress and any emotional and social challenges. What are some questions to ask my health care provider?  Do I need to meet with a diabetes educator?  Do I need to meet with a dietitian?  What number can I call if I have questions?  When are the best times to check my blood glucose? Where to find more  information:  American Diabetes Association: diabetes.org/food-and-fitness/food  Academy of Nutrition and Dietetics: PokerClues.dk  [employer] of Diabetes and Digestive and Kidney Diseases (NIH): ContactWire.be Summary  A healthy meal plan will help you control your blood glucose and maintain a healthy lifestyle.  Working with a diet and nutrition specialist (dietitian) can help you make a meal plan that is best for you.  Keep in mind that carbohydrates and alcohol have immediate effects on your blood glucose levels. It is important to count carbohydrates and to use alcohol carefully. This information is not intended to replace advice given to you by your health care provider. Make sure you discuss any questions you have with your health care provider. Document Released: 03/06/2005 Document Revised: 07/14/2016 Document Reviewed: 07/14/2016 Elsevier Interactive Patient Education  [employer].  Thank you for coming in today. I hope you feel we met your needs.  Feel free to call PCP if you have any questions or further requests.  Please consider signing up for MyChart if you do not already have  it, as this is a great way to communicate with me.  Best,  Whitney McVey, PA-C  IF you received an x-ray today, you will receive an invoice from [facility]. Please contact [facility] at [phone] with questions or concerns regarding your invoice.   IF you received labwork today, you will receive an invoice from [facility]. Please contact [facility] at [phone] with questions or concerns regarding your invoice.   Our billing staff will not be able to assist you with questions regarding bills from these companies.  You will be contacted with the lab results as soon as they are available. The fastest way to get your results is to activate your  My Chart account. Instructions are located on the last page of this paperwork. If you have not heard from [place] regarding the results in 2 weeks, please contact this office.

## 2017-12-30 NOTE — Progress Notes (Signed)
Jason [name]  MRN: [id number] DOB: 1951/01/29  PCP: Patient, [name]  Subjective:  Pt is a 67 year old male who presents to clinic for f/u DM and anxiety.   Dm - Metformin 1000 mg bid. He is taking this twice daily. Ran out of test strips and would like refill. Home sugars are usually around 140, sometimes low 200's. He has cut out ice cream.  Lab Results  Component Value Date   HGBA1C 9.1 09/30/2017   Anxiety - Increased Zoloft to 150mg  at last OV 09/30/2017. "I'm feeling clearer, but still not quite myself".   DDD- has been seeing ortho for back pain.  His orthopedist wants his sugars to the lower before considering surgery.  MRI from 11/12/2017 shows Adjacent segment disease with large L3-4 contiguous disc extrusion resulting in severe canal stenosis and traversing L4 nerve impingement, Multilevel neural foraminal narrowing: Severe on the LEFT at L3-4. Moderate canal stenosis T11-12, L1-2.   Review of Systems  Patient Active Problem List   Diagnosis Date Noted  . Status post lumbar spinal fusion 10/27/2017  . Acute on chronic combined systolic and diastolic CHF (congestive heart failure) ([place]) 10/25/2016  . NSTEMI (non-ST elevated myocardial infarction) ([place]) 10/22/2016  . Hypothyroidism 08/17/2015  . RBBB 05/11/2015  . Numbness of hand   . Hyponatremia 04/04/2015  . Hypotension 04/04/2015  . Hemispheric carotid artery syndrome   . Carotid artery narrowing 03/28/2015  . Carotid stenosis- moderate 2011, 95% 2016 s/p stent 02/21/2014  . Unstable angina ([place]) 01/16/2014  . Tobacco abuse 01/16/2014  . Chest pain 07/08/2013  . Substance abuse in remission ([place]) 10/01/2012  . Radiculitis 02/10/2012  . CAD, CABG Feb 2011, cath x 4 since-medical Rx 10/03/2011  . PVD, hx Rt femoral endarterectomy 2004 10/03/2011  . DM (diabetes mellitus) ([place]) 10/02/2011  . HTN (hypertension) 10/02/2011  . Hyperlipidemia 10/02/2011    Current Outpatient Medications on File Prior to  Visit  Medication Sig Dispense Refill  . acetaminophen (TYLENOL) 325 MG tablet Take 2 tablets (650 mg total) by mouth every 4 (four) hours as needed for headache or mild pain.    Marland Kitchen albuterol (PROVENTIL) (2.5 MG/3ML) 0.083% nebulizer solution USE 1 VIAL IN NEBULIZER EVERY 6 HOURS AS NEEDED 150 mL 1  . aspirin 81 MG tablet Take 1 tablet (81 mg total) by mouth daily.    Marland Kitchen atorvastatin (LIPITOR) 80 MG tablet Take 1 tablet (80 mg total) by mouth daily at 6 PM. 30 tablet 5  . clopidogrel (PLAVIX) 75 MG tablet Take 1 tablet (75 mg total) by mouth daily. Please call to make appointment for further refills. 60 tablet 0  . isosorbide mononitrate (IMDUR) 60 MG 24 hr tablet TAKE 1.5 TABLETS BY MOUTH DAILY. 45 tablet 0  . levothyroxine (SYNTHROID, LEVOTHROID) 50 MCG tablet TAKE 1 TABLET BY MOUTH EVERY DAY 30 tablet 0  . loratadine (CLARITIN) 10 MG tablet Take 10 mg by mouth daily.    . metFORMIN (GLUCOPHAGE) 1000 MG tablet TAKE 1 TABLET BY MOUTH TWICE A DAY WITH A MEAL 180 tablet 3  . metoprolol succinate (TOPROL-XL) 100 MG 24 hr tablet TAKE 1 TABLET BY MOUTH EVERY DAY TAKE WITH OR IMMEDIATELY FOLLOWING A MEAL 90 tablet 0  . nitroGLYCERIN (NITROSTAT) 0.4 MG SL tablet PLACE 1 TABLET (0.4 MG TOTAL) UNDER THE TONGUE EVERY 5 (FIVE) MINUTES AS NEEDED FOR CHEST PAIN. 25 tablet 2  . RANEXA 1000 MG SR tablet TAKE 1 TABLET BY MOUTH TWICE A DAY  60 tablet 0  . ranitidine (ZANTAC) 150 MG tablet Take 150 mg by mouth daily as needed for heartburn. For acid reflux    . sertraline (ZOLOFT) 100 MG tablet TAKE 1 TABLET BY MOUTH EVERY DAY 30 tablet 0  . sertraline (ZOLOFT) 50 MG tablet Take 50mg  in addition to 100mg  for a total of 150mg /day 30 tablet 3  . diazepam (VALIUM) 5 MG tablet Take one tablet one hour prior to procedure. Repeat if needed. (Patient not taking: Reported on 12/01/2017) 2 tablet 0  . glimepiride (AMARYL) 1 MG tablet Take 1 tablet (1 mg total) by mouth daily with breakfast. (Patient not taking: Reported on  10/27/2017) 4 tablet 0  . traMADol-acetaminophen (ULTRACET) 37.5-325 MG tablet Take 1 tablet by mouth every 6 (six) hours as needed. (Patient not taking: Reported on 12/01/2017) 20 tablet 0   No current facility-administered medications on file prior to visit.     Allergies  Allergen Reactions  . Coreg [Carvedilol] Nausea And Vomiting and Other (See Comments)    Per patient made him dizzy and light sensitive  . Sulfa Antibiotics Nausea And Vomiting and Other (See Comments)    Also headaches      Objective:  BP 130/72 (BP Location: Left Arm, Patient Position: Sitting, Cuff Size: Normal)   Pulse 86   Temp 98.1 F (36.7 C) (Oral)   Resp 16   Ht 5\' 7"  (1.702 m)   Wt 166 lb 6.4 oz (75.5 kg)   SpO2 98%   BMI 26.06 kg/m   Physical Exam  Constitutional: He is oriented to person, place, and time. He appears well-developed and well-nourished.  Neck: Carotid bruit is not present.  Cardiovascular: Normal rate and regular rhythm.  Neurological: He is alert and oriented to person, place, and time.  Skin: Skin is warm and dry.  Psychiatric: He has a normal mood and affect. His behavior is normal. Judgment and thought content normal.  Vitals reviewed.   Results for orders placed or performed in visit on 12/30/17  POCT glycosylated hemoglobin (Hb A1C)  Result Value Ref Range   Hemoglobin A1C 6.2 (A) 4.0 - 5.6 %   HbA1c POC (<> result, manual entry)  4.0 - 5.6 %   HbA1c, POC (prediabetic range)  5.7 - 6.4 %   HbA1c, POC (controlled diabetic range)  0.0 - 7.0 %    Assessment and Plan :  1. Type 2 diabetes mellitus with other circulatory complication, with long-term current use of insulin ([place]) -Patient here for follow-up diabetes.  A1c has improved to 6.2%, this is down from 9.1% about 3 months ago.  Continue metformin 1,000 mg bid.  Return to clinic in 3 months for annual exam - POCT glycosylated hemoglobin (Hb A1C) - metFORMIN (GLUCOPHAGE) 1000 MG tablet; TAKE 1 TABLET BY MOUTH TWICE A  DAY WITH A MEAL  Dispense: 180 tablet; Refill: 3  2. Generalized anxiety disorder 3. Depression, unspecified depression type - Pt c/o nonresolving depression and anxiety. Plan to increase Zoloft to 200mg . - sertraline (ZOLOFT) 100 MG tablet; Take 2 tablets (200 mg total) by mouth daily.  Dispense: 60 tablet; Refill: 3   Whitney Javeria Briski, PA-C  [facility] 12/30/2017 2:35 PM

## 2017-12-31 ENCOUNTER — Other Ambulatory Visit: Payer: Self-pay | Admitting: Cardiovascular Disease

## 2018-01-14 ENCOUNTER — Other Ambulatory Visit: Payer: Self-pay | Admitting: Cardiovascular Disease

## 2018-01-21 ENCOUNTER — Other Ambulatory Visit: Payer: Self-pay | Admitting: Physician Assistant

## 2018-01-21 DIAGNOSIS — F411 Generalized anxiety disorder: Secondary | ICD-10-CM

## 2018-01-21 NOTE — Telephone Encounter (Signed)
Refill request for sertraline 100 mg tab Last refilled on 12/30/17 for #60 tabs and 3 refills Pharmacy requesting DX Code. And dispensing of 180 tabs with 2 refills. [facility] [id number] [place]

## 2018-01-26 ENCOUNTER — Other Ambulatory Visit: Payer: Self-pay | Admitting: *Deleted

## 2018-01-26 MED ORDER — ISOSORBIDE MONONITRATE ER 60 MG PO TB24: 90.0000 mg | ORAL_TABLET | Freq: Every day | ORAL | 0 refills | Status: DC

## 2018-01-27 ENCOUNTER — Other Ambulatory Visit: Payer: Self-pay | Admitting: Cardiovascular Disease

## 2018-01-27 NOTE — Telephone Encounter (Signed)
Rx sent to pharmacy   

## 2018-02-02 ENCOUNTER — Ambulatory Visit (INDEPENDENT_AMBULATORY_CARE_PROVIDER_SITE_OTHER): Payer: Medicare Other | Admitting: Orthopaedic Surgery

## 2018-02-13 ENCOUNTER — Other Ambulatory Visit: Payer: Self-pay | Admitting: Cardiovascular Disease

## 2018-02-20 ENCOUNTER — Other Ambulatory Visit: Payer: Self-pay | Admitting: Cardiovascular Disease

## 2018-03-10 ENCOUNTER — Other Ambulatory Visit: Payer: Self-pay | Admitting: Cardiovascular Disease

## 2018-04-09 ENCOUNTER — Other Ambulatory Visit: Payer: Self-pay | Admitting: Cardiology

## 2018-04-15 ENCOUNTER — Ambulatory Visit: Payer: Medicare Other | Admitting: Physician Assistant

## 2018-05-01 ENCOUNTER — Other Ambulatory Visit: Payer: Self-pay | Admitting: Cardiovascular Disease

## 2018-05-01 ENCOUNTER — Other Ambulatory Visit: Payer: Self-pay | Admitting: Physician Assistant

## 2018-05-01 DIAGNOSIS — F411 Generalized anxiety disorder: Secondary | ICD-10-CM

## 2018-05-03 NOTE — Telephone Encounter (Signed)
[facility] called and spoke to Jason [name], [facility] who says the patient picked up last month and there are plenty refills available.

## 2018-05-06 ENCOUNTER — Ambulatory Visit (INDEPENDENT_AMBULATORY_CARE_PROVIDER_SITE_OTHER): Payer: Medicare Other | Admitting: Physician Assistant

## 2018-05-06 ENCOUNTER — Encounter: Payer: Self-pay | Admitting: Physician Assistant

## 2018-05-06 VITALS — BP 132/82 | HR 87 | Temp 97.7°F | Resp 16 | Ht 67.5 in | Wt 163.4 lb

## 2018-05-06 DIAGNOSIS — E1159 Type 2 diabetes mellitus with other circulatory complications: Secondary | ICD-10-CM

## 2018-05-06 DIAGNOSIS — E785 Hyperlipidemia, unspecified: Secondary | ICD-10-CM | POA: Diagnosis not present

## 2018-05-06 DIAGNOSIS — Z23 Encounter for immunization: Secondary | ICD-10-CM | POA: Diagnosis not present

## 2018-05-06 DIAGNOSIS — F329 Major depressive disorder, single episode, unspecified: Secondary | ICD-10-CM | POA: Diagnosis not present

## 2018-05-06 DIAGNOSIS — Z794 Long term (current) use of insulin: Secondary | ICD-10-CM | POA: Diagnosis not present

## 2018-05-06 DIAGNOSIS — I10 Essential (primary) hypertension: Secondary | ICD-10-CM

## 2018-05-06 LAB — POCT GLYCOSYLATED HEMOGLOBIN (HGB A1C): Hemoglobin A1C: 6.6 % — AB (ref 4.0–5.6)

## 2018-05-06 MED ORDER — SERTRALINE HCL 100 MG PO TABS: 250.0000 mg | ORAL_TABLET | Freq: Every day | ORAL | 3 refills | Status: DC

## 2018-05-06 MED ORDER — ISOSORBIDE MONONITRATE ER 60 MG PO TB24: 90.0000 mg | ORAL_TABLET | Freq: Every day | ORAL | 2 refills | Status: DC

## 2018-05-06 NOTE — Patient Instructions (Addendum)
Start taking Zooft 250 mg daily.  Please make an appointment with Troy Sine, MD at [facility] for follow-up for your heart.  Come back and see [place] in 3 months to recheck.   Start taking flaxseed oil daily.  Daily consumption of almonds (42 g/day or about half a cup or 1.5 ounces), walnuts, pistachios alone or in combination with dark chocolate had favorable effects on total cholesterol  Fiber-rich foods, such as fruits, vegetables, beans, and oats, seem to lower cholesterol and are generally good for your health.     Populations with high intakes of omega-3 (n-3) polyunsaturated fatty acids (such as the [place]) have low rates of heart disease I recommend a fish oil/omega-3 supplement daily as well as focusing on low cholesterol diet and exercise. It is important to take an omega-3/fish oil supplement that is highest in the [place] and EPA types of omega-3 so look for those under the active ingredients and pick the one with the most during the next time you purchase supplements. Sometimes the the store brands will actually end up being the best for the least cost. Do not pay attention to the total mg listed on the front of the bottle.    A Mediterranean diet appears to reduce the risk of cardiovascular events. There is no single Mediterranean diet, but such diets are typically high in fruits, vegetables, whole grains, beans, nuts, and seeds and include olive oil as an important source of fat; there are typically low to moderate amounts of fish, poultry, and dairy products, and there is little red meat.    Influenza (Flu) Vaccine (Inactivated or Recombinant): What You Need to Know 1. Why get vaccinated? Influenza ("flu") is a contagious disease that spreads around the [place] every year, usually between October and May. Flu is caused by influenza viruses, and is spread mainly by coughing, sneezing, and close contact. Anyone can get flu. Flu strikes suddenly and can last  several days. Symptoms vary by age, but can include:  fever/chills  sore throat  muscle aches  fatigue  cough  headache  runny or stuffy nose  Flu can also lead to pneumonia and blood infections, and cause diarrhea and seizures in children. If you have a medical condition, such as heart or lung disease, flu can make it worse. Flu is more dangerous for some people. Infants and young children, people 9 years of age and older, pregnant women, and people with certain health conditions or a weakened immune system are at greatest risk. Each year thousands of people in the [place] die from flu, and many more are hospitalized. Flu vaccine can:  keep you from getting flu,  make flu less severe if you do get it, and  keep you from spreading flu to your family and other people. 2. Inactivated and recombinant flu vaccines A dose of flu vaccine is recommended every flu season. Children 6 months through 69 years of age may need two doses during the same flu season. Everyone else needs only one dose each flu season. Some inactivated flu vaccines contain a very small amount of a mercury-based preservative called thimerosal. Studies have not shown thimerosal in vaccines to be harmful, but flu vaccines that do not contain thimerosal are available. There is no live flu virus in flu shots. They cannot cause the flu. There are many flu viruses, and they are always changing. Each year a new flu vaccine is made to protect against three or four viruses that are  likely to cause disease in the upcoming flu season. But even when the vaccine doesn't exactly match these viruses, it may still provide some protection. Flu vaccine cannot prevent:  flu that is caused by a virus not covered by the vaccine, or  illnesses that look like flu but are not.  It takes about 2 weeks for protection to develop after vaccination, and protection lasts through the flu season. 3. Some people should not get this  vaccine Tell the person who is giving you the vaccine:  If you have any severe, life-threatening allergies. If you ever had a life-threatening allergic reaction after a dose of flu vaccine, or have a severe allergy to any part of this vaccine, you may be advised not to get vaccinated. Most, but not all, types of flu vaccine contain a small amount of egg protein.  If you ever had Guillain-Barr Syndrome (also called GBS). Some people with a history of GBS should not get this vaccine. This should be discussed with your doctor.  If you are not feeling well. It is usually okay to get flu vaccine when you have a mild illness, but you might be asked to come back when you feel better.  4. Risks of a vaccine reaction With any medicine, including vaccines, there is a chance of reactions. These are usually mild and go away on their own, but serious reactions are also possible. Most people who get a flu shot do not have any problems with it. Minor problems following a flu shot include:  soreness, redness, or swelling where the shot was given  hoarseness  sore, red or itchy eyes  cough  fever  aches  headache  itching  fatigue  If these problems occur, they usually begin soon after the shot and last 1 or 2 days. More serious problems following a flu shot can include the following:  There may be a small increased risk of Guillain-Barre Syndrome (GBS) after inactivated flu vaccine. This risk has been estimated at 1 or 2 additional cases per million people vaccinated. This is much lower than the risk of severe complications from flu, which can be prevented by flu vaccine.  Young children who get the flu shot along with pneumococcal vaccine (PCV13) and/or DTaP vaccine at the same time might be slightly more likely to have a seizure caused by fever. Ask your doctor for more information. Tell your doctor if a child who is getting flu vaccine has ever had a seizure.  Problems that could happen  after any injected vaccine:  People sometimes faint after a medical procedure, including vaccination. Sitting or lying down for about 15 minutes can help prevent fainting, and injuries caused by a fall. Tell your doctor if you feel dizzy, or have vision changes or ringing in the ears.  Some people get severe pain in the shoulder and have difficulty moving the arm where a shot was given. This happens very rarely.  Any medication can cause a severe allergic reaction. Such reactions from a vaccine are very rare, estimated at about 1 in a million doses, and would happen within a few minutes to a few hours after the vaccination. As with any medicine, there is a very remote chance of a vaccine causing a serious injury or death. The safety of vaccines is always being monitored. For more information, visit: http://www.aguilar.org/ 5. What if there is a serious reaction? What should I look for? Look for anything that concerns you, such as signs of a severe allergic  reaction, very high fever, or unusual behavior. Signs of a severe allergic reaction can include hives, swelling of the face and throat, difficulty breathing, a fast heartbeat, dizziness, and weakness. These would start a few minutes to a few hours after the vaccination. What should I do?  If you think it is a severe allergic reaction or other emergency that can't wait, call 9-1-1 and get the person to the nearest hospital. Otherwise, call your doctor.  Reactions should be reported to the Vaccine Adverse Event Reporting System (VAERS). Your doctor should file this report, or you can do it yourself through the VAERS web site at www.vaers.SamedayNews.es, or by calling 458-505-9407. ? VAERS does not give medical advice. 6. The National Vaccine Injury Compensation Program The [employer] Vaccine Injury Compensation Program (VICP) is a federal program that was created to compensate people who may have been injured by certain vaccines. Persons who believe  they may have been injured by a vaccine can learn about the program and about filing a claim by calling 325 291 8993 or visiting the [place] website at GoldCloset.com.ee. There is a time limit to file a claim for compensation. 7. How can I learn more?  Ask your healthcare provider. He or she can give you the vaccine package insert or suggest other sources of information.  Call your local or state health department.  Contact the Centers for Disease Control and Prevention (CDC): ? Call (320) 570-1040 (1-800-CDC-INFO) or ? Visit CDC's website at https://gibson.com/ Vaccine Information Statement, Inactivated Influenza Vaccine (01/27/2014) This information is not intended to replace advice given to you by your health care provider. Make sure you discuss any questions you have with your health care provider. Document Released: 04/03/2006 Document Revised: 02/28/2016 Document Reviewed: 02/28/2016 Elsevier Interactive Patient Education  2017 [employer].

## 2018-05-06 NOTE — Progress Notes (Signed)
Jason [name]  MRN: [id number] DOB: [birth year]  PCP: Patient, [name]  Subjective:  Pt is a 67 year old male who presents to clinic for medication refill of Zoloft 100mg  and Imdur 60mg .   Anxiety - Increased Zoloft to 200mg . Still no drive to do anything. Denies SI or HI PTSD from bad experience at work.  SI since age 62.   Dm - Metformin 1,000 mg bid. He is taking this twice daily. Checks home sugars, and are usually around 150, sometimes are 130. Denies sock and glove paresthesia, n/t, polyuria.   HLD - he is not taking atorvastatin due to side effects.    Review of Systems  Cardiovascular: Negative for chest pain, palpitations and leg swelling.  Neurological: Negative for dizziness, numbness and headaches.  Psychiatric/Behavioral: Positive for dysphoric mood. Negative for self-injury and suicidal ideas. The patient is nervous/anxious.     Patient Active Problem List   Diagnosis Date Noted  . Status post lumbar spinal fusion 10/27/2017  . Acute on chronic combined systolic and diastolic CHF (congestive heart failure) ([place]) 10/25/2016  . NSTEMI (non-ST elevated myocardial infarction) ([place]) 10/22/2016  . Hypothyroidism 08/17/2015  . RBBB 05/11/2015  . Numbness of hand   . Hyponatremia 04/04/2015  . Hypotension 04/04/2015  . Hemispheric carotid artery syndrome   . Carotid artery narrowing 03/28/2015  . Carotid stenosis- moderate 2011, 95% 2016 s/p stent 02/21/2014  . Unstable angina ([place]) 01/16/2014  . Tobacco abuse 01/16/2014  . Chest pain 07/08/2013  . Substance abuse in remission ([place]) 10/01/2012  . Radiculitis 02/10/2012  . CAD, CABG Feb 2011, cath x 4 since-medical Rx 10/03/2011  . PVD, hx Rt femoral endarterectomy 2004 10/03/2011  . DM (diabetes mellitus) ([place]) 10/02/2011  . HTN (hypertension) 10/02/2011  . Hyperlipidemia 10/02/2011    Current Outpatient Medications on File Prior to Visit  Medication Sig Dispense Refill  . acetaminophen (TYLENOL) 325  MG tablet Take 2 tablets (650 mg total) by mouth every 4 (four) hours as needed for headache or mild pain.    Marland Kitchen albuterol (PROVENTIL) (2.5 MG/3ML) 0.083% nebulizer solution USE 1 VIAL IN NEBULIZER EVERY 6 HOURS AS NEEDED 150 mL 1  . aspirin 81 MG tablet Take 1 tablet (81 mg total) by mouth daily.    Marland Kitchen atorvastatin (LIPITOR) 80 MG tablet Take 1 tablet (80 mg total) by mouth daily at 6 PM. 30 tablet 5  . clopidogrel (PLAVIX) 75 MG tablet TAKE 1 TABLET (75 MG TOTAL) BY MOUTH DAILY. NEED OV. 90 tablet 0  . clopidogrel (PLAVIX) 75 MG tablet Take 1 tablet (75 mg total) by mouth daily. NEED OV. 15 tablet 0  . diazepam (VALIUM) 5 MG tablet Take one tablet one hour prior to procedure. Repeat if needed. (Patient not taking: Reported on 12/01/2017) 2 tablet 0  . glimepiride (AMARYL) 1 MG tablet Take 1 tablet (1 mg total) by mouth daily with breakfast. (Patient not taking: Reported on 10/27/2017) 4 tablet 0  . isosorbide mononitrate (IMDUR) 60 MG 24 hr tablet Take 1.5 tablets (90 mg total) by mouth daily. NEED OV. 45 tablet 0  . isosorbide mononitrate (IMDUR) 60 MG 24 hr tablet TAKE 1.5 TABLETS BY MOUTH DAILY. 135 tablet 0  . levothyroxine (SYNTHROID, LEVOTHROID) 50 MCG tablet TAKE 1 TABLET BY MOUTH EVERY DAY 30 tablet 0  . loratadine (CLARITIN) 10 MG tablet Take 10 mg by mouth daily.    . metFORMIN (GLUCOPHAGE) 1000 MG tablet TAKE 1 TABLET BY MOUTH TWICE A  DAY WITH A MEAL 180 tablet 3  . metoprolol succinate (TOPROL-XL) 100 MG 24 hr tablet TAKE 1 TABLET BY MOUTH EVERY DAY TAKE WITH OR IMMEDIATELY FOLLOWING A MEAL 90 tablet 0  . nitroGLYCERIN (NITROSTAT) 0.4 MG SL tablet PLACE 1 TABLET (0.4 MG TOTAL) UNDER THE TONGUE EVERY 5 (FIVE) MINUTES AS NEEDED FOR CHEST PAIN. 25 tablet 2  . nitroGLYCERIN (NITROSTAT) 0.4 MG SL tablet PLACE 1 TABLET (0.4 MG TOTAL) UNDER THE TONGUE EVERY 5 (FIVE) MINUTES AS NEEDED FOR CHEST PAIN. 25 tablet 2  . RANEXA 1000 MG SR tablet TAKE 1 TABLET BY MOUTH TWICE A DAY 60 tablet 0  .  ranitidine (ZANTAC) 150 MG tablet Take 150 mg by mouth daily as needed for heartburn. For acid reflux    . sertraline (ZOLOFT) 100 MG tablet TAKE 2 TABLETS BY MOUTH EVERY DAY 180 tablet 2  . sertraline (ZOLOFT) 50 MG tablet Take 50mg  in addition to 100mg  for a total of 150mg /day 30 tablet 3   No current facility-administered medications on file prior to visit.     Allergies  Allergen Reactions  . Coreg [Carvedilol] Nausea And Vomiting and Other (See Comments)    Per patient made him dizzy and light sensitive  . Sulfa Antibiotics Nausea And Vomiting and Other (See Comments)    Also headaches      Objective:  BP 132/82 (BP Location: Left Arm, Patient Position: Sitting, Cuff Size: Normal)   Pulse 87   Temp 97.7 F (36.5 C) (Oral)   Resp 16   Ht 5' 7.5" (1.715 m)   Wt 163 lb 6.4 oz (74.1 kg)   SpO2 99%   BMI 25.21 kg/m   Physical Exam  Constitutional: He appears well-developed and well-nourished.  Cardiovascular: Normal rate and regular rhythm.  Pulmonary/Chest: Effort normal. No respiratory distress.  Psychiatric: He has a normal mood and affect. Thought content normal.  Vitals reviewed.   Results for orders placed or performed in visit on 05/06/18  POCT glycosylated hemoglobin (Hb A1C)  Result Value Ref Range   Hemoglobin A1C 6.6 (A) 4.0 - 5.6 %   HbA1c POC (<> result, manual entry)     HbA1c, POC (prediabetic range)     HbA1c, POC (controlled diabetic range)     Assessment and Plan :  1. Major depressive disorder, remission status unspecified, unspecified whether recurrent - Uncontrolled, but a little better. Denies Si or HI. Plan to increase Zoloft to 250mg . Recheck in 1-2 months.  - sertraline (ZOLOFT) 100 MG tablet; Take 2.5 tablets (250 mg total) by mouth daily.  Dispense: 180 tablet; Refill: 3  2. Type 2 diabetes mellitus with other circulatory complication, with long-term current use of insulin (HCC) - A1C worsened from 6.2 to 6.6. Advised improved diet and  exercise.  - POCT glycosylated hemoglobin (Hb A1C)  3. Hyperlipidemia, unspecified hyperlipidemia type - Lipid panel  4. Essential hypertension - isosorbide mononitrate (IMDUR) 60 MG 24 hr tablet; Take 1.5 tablets (90 mg total) by mouth daily.  Dispense: 45 tablet; Refill: 2  5. Flu vaccine need   Mercer Pod, PA-C  [facility] at [place] 05/06/2018 4:08 PM  Please note: Portions of this report may have been transcribed using dragon voice recognition software. Every effort was made to ensure accuracy; however, inadvertent computerized transcription errors may be present.

## 2018-05-07 LAB — LIPID PANEL
Chol/HDL Ratio: 7.9 ratio — ABNORMAL HIGH (ref 0.0–5.0)
Cholesterol, Total: 324 mg/dL — ABNORMAL HIGH (ref 100–199)
HDL: 41 mg/dL (ref >39)
LDL Calculated: 205 mg/dL — ABNORMAL HIGH (ref 0–99)
Triglycerides: 391 mg/dL — ABNORMAL HIGH (ref 0–149)
VLDL Cholesterol Cal: 78 mg/dL — ABNORMAL HIGH (ref 5–40)

## 2018-05-11 ENCOUNTER — Other Ambulatory Visit: Payer: Self-pay | Admitting: Cardiovascular Disease

## 2018-05-13 ENCOUNTER — Encounter: Payer: Self-pay | Admitting: Physician Assistant

## 2018-05-13 NOTE — Progress Notes (Signed)
Result letter sent in mail. Your cholesterol levels remain high.  In order to decrease your risk of having a heart attack or stroke, we need to get these numbers down.  I know you don't like taking your lipitor because of the side effects, but start taking this every other day and see if this helps. Add fish oil and flax seed. Encourage healthy diet.  Recheck in 3-6 months.

## 2018-06-02 ENCOUNTER — Other Ambulatory Visit: Payer: Self-pay | Admitting: Physician Assistant

## 2018-06-02 DIAGNOSIS — F329 Major depressive disorder, single episode, unspecified: Secondary | ICD-10-CM

## 2018-06-30 ENCOUNTER — Other Ambulatory Visit: Payer: Self-pay | Admitting: Cardiovascular Disease

## 2018-07-01 ENCOUNTER — Other Ambulatory Visit: Payer: Self-pay | Admitting: Physician Assistant

## 2018-07-01 DIAGNOSIS — F329 Major depressive disorder, single episode, unspecified: Secondary | ICD-10-CM

## 2018-07-15 ENCOUNTER — Other Ambulatory Visit: Payer: Self-pay | Admitting: Cardiovascular Disease

## 2018-07-19 ENCOUNTER — Other Ambulatory Visit: Payer: Self-pay | Admitting: Cardiovascular Disease

## 2018-08-10 ENCOUNTER — Other Ambulatory Visit: Payer: Self-pay | Admitting: Cardiovascular Disease

## 2018-08-17 ENCOUNTER — Other Ambulatory Visit: Payer: Self-pay | Admitting: Family Medicine

## 2018-08-17 DIAGNOSIS — E118 Type 2 diabetes mellitus with unspecified complications: Secondary | ICD-10-CM

## 2018-08-31 ENCOUNTER — Other Ambulatory Visit: Payer: Self-pay | Admitting: Physician Assistant

## 2018-08-31 DIAGNOSIS — I10 Essential (primary) hypertension: Secondary | ICD-10-CM

## 2018-08-31 NOTE — Telephone Encounter (Signed)
Requested Prescriptions  Pending Prescriptions Disp Refills  . isosorbide mononitrate (IMDUR) 60 MG 24 hr tablet [Pharmacy Med Name: ISOSORBIDE MONONIT ER 60 MG TB] 135 tablet 0    Sig: TAKE 1.5 TABLETS (90 MG TOTAL) BY MOUTH DAILY.     Cardiovascular:  Nitrates Passed - 08/31/2018  2:04 AM      Passed - Last BP in normal range    BP Readings from Last 1 Encounters:  05/06/18 132/82         Passed - Last Heart Rate in normal range    Pulse Readings from Last 1 Encounters:  05/06/18 87         Passed - Valid encounter within last 12 months    Recent Outpatient Visits          3 months ago Major depressive disorder, remission status unspecified, unspecified whether recurrent   [facility] Care at [facility], Gelene Mink, PA-C   8 months ago Type 2 diabetes mellitus with other circulatory complication, with long-term current use of insulin [facility])   Primary Care at [facility], [place], PA-C   11 months ago Type 2 diabetes mellitus with other circulatory complication, with long-term current use of insulin [facility])   Primary Care at [facility], Gelene Mink, PA-C   1 year ago Lower respiratory infection   Primary Care at [facility], Gelene Mink, PA-C   1 year ago Cough   Primary Care at [place], PA-C

## 2018-09-05 ENCOUNTER — Other Ambulatory Visit: Payer: Self-pay | Admitting: Cardiovascular Disease

## 2018-09-09 ENCOUNTER — Other Ambulatory Visit: Payer: Self-pay | Admitting: Cardiovascular Disease

## 2018-09-20 ENCOUNTER — Other Ambulatory Visit: Payer: Self-pay | Admitting: Cardiovascular Disease

## 2018-10-09 ENCOUNTER — Other Ambulatory Visit: Payer: Self-pay | Admitting: Physician Assistant

## 2018-10-09 DIAGNOSIS — F329 Major depressive disorder, single episode, unspecified: Secondary | ICD-10-CM

## 2018-10-09 NOTE — Telephone Encounter (Signed)
Please advise 

## 2018-10-27 ENCOUNTER — Telehealth (INDEPENDENT_AMBULATORY_CARE_PROVIDER_SITE_OTHER): Payer: Medicare Other | Admitting: Emergency Medicine

## 2018-10-27 ENCOUNTER — Telehealth: Payer: Self-pay | Admitting: *Deleted

## 2018-10-27 ENCOUNTER — Other Ambulatory Visit: Payer: Self-pay

## 2018-10-27 ENCOUNTER — Encounter: Payer: Self-pay | Admitting: Emergency Medicine

## 2018-10-27 DIAGNOSIS — I25118 Atherosclerotic heart disease of native coronary artery with other forms of angina pectoris: Secondary | ICD-10-CM

## 2018-10-27 DIAGNOSIS — J22 Unspecified acute lower respiratory infection: Secondary | ICD-10-CM

## 2018-10-27 MED ORDER — AMOXICILLIN-POT CLAVULANATE 875-125 MG PO TABS: 1.0000 | ORAL_TABLET | Freq: Two times a day (BID) | ORAL | 0 refills | Status: AC

## 2018-10-27 MED ORDER — NITROGLYCERIN 0.4 MG SL SUBL: SUBLINGUAL_TABLET | SUBLINGUAL | 6 refills | Status: DC

## 2018-10-27 NOTE — Telephone Encounter (Signed)
Called patient to triage for appointment.  Left message in mobile voice mail to call back. 

## 2018-10-27 NOTE — Progress Notes (Signed)
Telemedicine Encounter- SOAP NOTE Established Patient  This telephone encounter was conducted with the patient's (or proxy's) verbal consent via audio telecommunications: yes/no: Yes Patient was instructed to have this encounter in a suitably private space; and to only have persons present to whom they give permission to participate. In addition, patient identity was confirmed by use of name plus two identifiers (DOB and address).  I discussed the limitations, risks, security and privacy concerns of performing an evaluation and management service by telephone and the availability of in person appointments. I also discussed with the patient that there may be a patient responsible charge related to this service. The patient expressed understanding and agreed to proceed.  I spent a total of TIME; 0 MIN TO 60 MIN: 10 minutes talking with the patient or their proxy.  No chief complaint on file. "I think I have bronchitis"  Subjective   Jason [name] is a 68 y.o. male established patient.  First visit with me.  Telephone visit today complaining of flulike symptoms on and off for the past 4 weeks.  Has been taking Mucinex and Tylenol.  Productive cough with occasional burning sensation in his chest.  States it feels like previous bouts of bronchitis.  Does not feel like anginal episodes.  Denies fever or chills.  Denies difficulty breathing.  Able to eat and drink.  Denies nausea or vomiting.  No other significant symptoms.  HPI   Patient Active Problem List   Diagnosis Date Noted  . Status post lumbar spinal fusion 10/27/2017  . Acute on chronic combined systolic and diastolic CHF (congestive heart failure) ([place]) 10/25/2016  . NSTEMI (non-ST elevated myocardial infarction) ([place]) 10/22/2016  . Hypothyroidism 08/17/2015  . RBBB 05/11/2015  . Hyponatremia 04/04/2015  . Hypotension 04/04/2015  . Hemispheric carotid artery syndrome   . Carotid artery narrowing 03/28/2015  . Carotid  stenosis- moderate 2011, 95% 2016 s/p stent 02/21/2014  . Unstable angina ([place]) 01/16/2014  . Tobacco abuse 01/16/2014  . Substance abuse in remission ([place]) 10/01/2012  . Radiculitis 02/10/2012  . CAD, CABG Feb 2011, cath x 4 since-medical Rx 10/03/2011  . PVD, hx Rt femoral endarterectomy 2004 10/03/2011  . DM (diabetes mellitus) ([place]) 10/02/2011  . HTN (hypertension) 10/02/2011  . Hyperlipidemia 10/02/2011    Past Medical History:  Diagnosis Date  . Anxiety    off xanax  and paxil since 3/13  . Arthritis   . Bilateral carotid artery disease ([place])    s/p R ICA stent 03/28/2015 with distal protection. Known chronically occluded L ICA. Carotid stent complicated by hypotension and acute stroke [place]  . Cancer ([place])    tumor basal cell rem from lft arm  . Cataract   . COPD (chronic obstructive pulmonary disease) ([place])   . Coronary artery disease    a. s/p CABG in 2011 with LIMA-LAD, SVG-RI/OM, and SVG-PDA b. occluded SVG-PDA by cath in 2015 c. 10/2016: NSTEMI with cath showing thrombus along the distal graft to insertion of SVG-OM2 with DES placed.   . CVA (cerebral infarction)    occured on 10/10 several days after R ICA carotid stenting  . Depression   . Diabetes mellitus   . GERD (gastroesophageal reflux disease)   . Heart attack ([place])   . High cholesterol   . Hypertension    type 2 NIDDM  . Myocardial infarct (HCC)    x4 last 10 yrs  . Pneumonia    hx  . RBBB (right bundle branch  block with left posterior fascicular block)   . Smoker   . Substance abuse ([place])     Current Outpatient Medications  Medication Sig Dispense Refill  . acetaminophen (TYLENOL) 325 MG tablet Take 2 tablets (650 mg total) by mouth every 4 (four) hours as needed for headache or mild pain.    Marland Kitchen albuterol (PROVENTIL) (2.5 MG/3ML) 0.083% nebulizer solution USE 1 VIAL IN NEBULIZER EVERY 6 HOURS AS NEEDED 150 mL 1  . aspirin 325 MG tablet Take 325 mg by mouth as needed.    .  clopidogrel (PLAVIX) 75 MG tablet Take 1 tablet (75 mg total) by mouth daily. NEED OV. 7 tablet 0  . diazepam (VALIUM) 5 MG tablet Take one tablet one hour prior to procedure. Repeat if needed. 2 tablet 0  . isosorbide mononitrate (IMDUR) 60 MG 24 hr tablet TAKE 1.5 TABLETS BY MOUTH DAILY. 135 tablet 0  . loratadine (CLARITIN) 10 MG tablet Take 10 mg by mouth daily.    . metFORMIN (GLUCOPHAGE) 1000 MG tablet TAKE 1 TABLET BY MOUTH TWICE A DAY WITH A MEAL 180 tablet 3  . metoprolol succinate (TOPROL-XL) 100 MG 24 hr tablet TAKE 1 TABLET BY MOUTH DAILY. CALL OUR OFFICE FOR AN APPOINTMENT 30 tablet 3  . nitroGLYCERIN (NITROSTAT) 0.4 MG SL tablet PLACE 1 TABLET (0.4 MG TOTAL) UNDER THE TONGUE EVERY 5 (FIVE) MINUTES AS NEEDED FOR CHEST PAIN. 25 tablet 6  . ranolazine (RANEXA) 1000 MG SR tablet TAKE 1 TABLET BY MOUTH 2 TIMES DAILY. NEED OV. 180 tablet 1  . sertraline (ZOLOFT) 100 MG tablet TAKE 2.5 TABLETS (250 MG TOTAL) BY MOUTH DAILY. 45 tablet 0  . amoxicillin-clavulanate (AUGMENTIN) 875-125 MG tablet Take 1 tablet by mouth 2 (two) times daily for 7 days. 14 tablet 0  . aspirin 81 MG tablet Take 1 tablet (81 mg total) by mouth daily. (Patient not taking: Reported on 10/27/2018)    . atorvastatin (LIPITOR) 80 MG tablet Take 1 tablet (80 mg total) by mouth daily at 6 PM. (Patient not taking: Reported on 10/27/2018) 30 tablet 5  . glimepiride (AMARYL) 1 MG tablet Take 1 tablet (1 mg total) by mouth daily with breakfast. (Patient not taking: Reported on 10/27/2018) 4 tablet 0  . isosorbide mononitrate (IMDUR) 60 MG 24 hr tablet TAKE 1.5 TABLETS (90 MG TOTAL) BY MOUTH DAILY. 135 tablet 0  . levothyroxine (SYNTHROID, LEVOTHROID) 50 MCG tablet TAKE 1 TABLET BY MOUTH EVERY DAY (Patient not taking: Reported on 10/27/2018) 30 tablet 0  . ranitidine (ZANTAC) 150 MG tablet Take 150 mg by mouth daily as needed for heartburn. For acid reflux     No current facility-administered medications for this visit.     Allergies   Allergen Reactions  . Coreg [Carvedilol] Nausea And Vomiting and Other (See Comments)    Per patient made him dizzy and light sensitive  . Sulfa Antibiotics Nausea And Vomiting and Other (See Comments)    Also headaches     Social History   Socioeconomic History  . Marital status: Single    Spouse name: Not on file  . Number of children: 1  . Years of education: 40  . Highest education level: Not on file  Occupational History  . Occupation: welder-fabricator  Social Needs  . Financial resource strain: Not on file  . Food insecurity:    Worry: Not on file    Inability: Not on file  . Transportation needs:    Medical: Not on file  Non-medical: Not on file  Tobacco Use  . Smoking status: Current Every Day Smoker    Packs/day: 1.00    Years: 48.00    Pack years: 48.00    Types: Cigarettes  . Smokeless tobacco: Never Used  Substance and Sexual Activity  . Alcohol use: Yes    Alcohol/week: 0.0 standard drinks    Comment: socially  . Drug use: No    Comment: hx of cocaine use  . Sexual activity: Not Currently  Lifestyle  . Physical activity:    Days per week: Not on file    Minutes per session: Not on file  . Stress: Not on file  Relationships  . Social connections:    Talks on phone: Not on file    Gets together: Not on file    Attends religious service: Not on file    Active member of club or organization: Not on file    Attends meetings of clubs or organizations: Not on file    Relationship status: Not on file  . Intimate partner violence:    Fear of current or ex partner: Not on file    Emotionally abused: Not on file    Physically abused: Not on file    Forced sexual activity: Not on file  Other Topics Concern  . Not on file  Social History Narrative   Lives alone   Caffeine use: Drinks pepsi/coffee (32 oz diet pepsi per day)    Exercise walking 4 times per week for 1 mile    Review of Systems  Constitutional: Negative.  Negative for chills and  fever.  HENT: Positive for congestion. Negative for sore throat.   Respiratory: Positive for cough. Negative for hemoptysis, shortness of breath and wheezing.   Cardiovascular: Positive for chest pain. Negative for palpitations.  Gastrointestinal: Negative for abdominal pain, diarrhea, nausea and vomiting.  Genitourinary: Negative for dysuria and hematuria.  Skin: Negative for rash.  Neurological: Negative for dizziness and headaches.  All other systems reviewed and are negative.   Objective   Vitals as reported by the patient: None available There were no vitals filed for this visit. Awake and oriented x3 in no apparent respiratory distress while talking to me on the phone. Diagnoses and all orders for this visit:  Lower respiratory infection (e.g., bronchitis, pneumonia, pneumonitis, pulmonitis) -     amoxicillin-clavulanate (AUGMENTIN) 875-125 MG tablet; Take 1 tablet by mouth 2 (two) times daily for 7 days.  Coronary artery disease of native artery of native heart with stable angina pectoris (HCC) -     nitroGLYCERIN (NITROSTAT) 0.4 MG SL tablet; PLACE 1 TABLET (0.4 MG TOTAL) UNDER THE TONGUE EVERY 5 (FIVE) MINUTES AS NEEDED FOR CHEST PAIN.  Clinically stable.  No red flag signs or symptoms. We will treat this episode as bronchitis. ED precautions given. Office visit if no better in 1 to 2 weeks.   I discussed the assessment and treatment plan with the patient. The patient was provided an opportunity to ask questions and all were answered. The patient agreed with the plan and demonstrated an understanding of the instructions.   The patient was advised to call back or seek an in-person evaluation if the symptoms worsen or if the condition fails to improve as anticipated.  I provided 10 minutes of non-face-to-face time during this encounter.  Horald Pollen, MD  Primary Care at [facility]

## 2018-10-27 NOTE — Progress Notes (Signed)
Contacted patient to triage for appointment. Patient is complaining of chest tightness and shortness of breath. Patient states he has a cough on and off for 1 month. Patient states he did not have a tempeture. Patient states he needs a refill on nitroglycerin medication.

## 2018-11-23 ENCOUNTER — Other Ambulatory Visit: Payer: Self-pay | Admitting: Family Medicine

## 2018-11-23 DIAGNOSIS — F329 Major depressive disorder, single episode, unspecified: Secondary | ICD-10-CM

## 2018-11-23 NOTE — Telephone Encounter (Signed)
Requested medication (s) are due for refill today: yes  Requested medication (s) are on the active medication list: yes  Last refill:  10/11/18  Future visit scheduled: No  Notes to clinic:  30 day courtesy refill given on 10/11/18. Pt had appt on 10/27/18 but mediation was not addressed.     Requested Prescriptions  Pending Prescriptions Disp Refills   sertraline (ZOLOFT) 100 MG tablet [Pharmacy Med Name: SERTRALINE HCL 100 MG TABLET] 45 tablet 0    Sig: TAKE 2.5 TABLETS BY MOUTH DAILY.     Psychiatry:  Antidepressants - SSRI Failed - 11/23/2018 12:25 PM      Failed - Valid encounter within last 6 months    Recent Outpatient Visits          6 months ago Major depressive disorder, remission status unspecified, unspecified whether recurrent   Primary Care at [facility], Gelene Mink, PA-C   10 months ago Type 2 diabetes mellitus with other circulatory complication, with long-term current use of insulin [facility])   Primary Care at [facility], Gelene Mink, PA-C   1 year ago Type 2 diabetes mellitus with other circulatory complication, with long-term current use of insulin [facility])   Primary Care at [facility], Gelene Mink, PA-C   1 year ago Lower respiratory infection   Primary Care at [facility], Gelene Mink, PA-C   1 year ago Cough   Primary Care at [place], PA-C             Failed - Completed PHQ-2 or PHQ-9 in the last 360 days.

## 2018-11-27 ENCOUNTER — Other Ambulatory Visit: Payer: Self-pay | Admitting: Physician Assistant

## 2018-11-27 DIAGNOSIS — I10 Essential (primary) hypertension: Secondary | ICD-10-CM

## 2018-11-27 NOTE — Telephone Encounter (Signed)
Requested Prescriptions  Pending Prescriptions Disp Refills  . isosorbide mononitrate (IMDUR) 60 MG 24 hr tablet [Pharmacy Med Name: ISOSORBIDE MONONIT ER 60 MG TB] 135 tablet 0    Sig: TAKE 1 AND 1/2 TABLET BY MOUTH EVERY DAY     Cardiovascular:  Nitrates Passed - 11/27/2018 12:56 AM      Passed - Last BP in normal range    BP Readings from Last 1 Encounters:  05/06/18 132/82         Passed - Last Heart Rate in normal range    Pulse Readings from Last 1 Encounters:  05/06/18 87         Passed - Valid encounter within last 12 months    Recent Outpatient Visits          6 months ago Major depressive disorder, remission status unspecified, unspecified whether recurrent   Primary Care at [facility] [place], PA-C   11 months ago Type 2 diabetes mellitus with other circulatory complication, with long-term current use of insulin [facility])   Primary Care at [facility], Gelene Mink, PA-C   1 year ago Type 2 diabetes mellitus with other circulatory complication, with long-term current use of insulin [facility])   Primary Care at [facility], Gelene Mink, PA-C   1 year ago Lower respiratory infection   Primary Care at [facility], Gelene Mink, PA-C   1 year ago Cough   Primary Care at [place], PA-C

## 2018-12-13 ENCOUNTER — Other Ambulatory Visit: Payer: Self-pay | Admitting: Cardiovascular Disease

## 2018-12-18 ENCOUNTER — Other Ambulatory Visit: Payer: Self-pay | Admitting: Family Medicine

## 2018-12-18 DIAGNOSIS — F329 Major depressive disorder, single episode, unspecified: Secondary | ICD-10-CM

## 2018-12-18 NOTE — Telephone Encounter (Signed)
>   3 months since OV due- sending back to provider to advise

## 2018-12-20 NOTE — Telephone Encounter (Signed)
Patient is requesting a refill of the following medications: Requested Prescriptions   Pending Prescriptions Disp Refills  . sertraline (ZOLOFT) 100 MG tablet [Pharmacy Med Name: SERTRALINE HCL 100 MG TABLET] 45 tablet 0    Sig: TAKE 2 AND 1/2 TABLETS BY MOUTH DAILY    Date of patient request: 12/18/18 Last office visit: 10/27/18 lower respiration infection and on 05/06/2018 for major depression disorder Date of last refill: 11/23/2018 Last refill amount: 45 tab  Follow up time period per chart: per chart on 05/06/18 return 07/2018.... an appt scheduled

## 2018-12-25 ENCOUNTER — Other Ambulatory Visit: Payer: Self-pay | Admitting: Cardiovascular Disease

## 2019-01-14 ENCOUNTER — Other Ambulatory Visit: Payer: Self-pay | Admitting: Physician Assistant

## 2019-01-14 DIAGNOSIS — E1159 Type 2 diabetes mellitus with other circulatory complications: Secondary | ICD-10-CM

## 2019-01-14 NOTE — Telephone Encounter (Signed)
Forwarding medication refill to provider for review. 

## 2019-01-20 ENCOUNTER — Other Ambulatory Visit: Payer: Self-pay | Admitting: Cardiovascular Disease

## 2019-01-20 ENCOUNTER — Other Ambulatory Visit: Payer: Self-pay | Admitting: Family Medicine

## 2019-01-20 DIAGNOSIS — F329 Major depressive disorder, single episode, unspecified: Secondary | ICD-10-CM

## 2019-01-20 NOTE — Telephone Encounter (Signed)
Requested medication (s) are due for refill today:   Yes  Requested medication (s) are on the active medication list:   Yes  Future visit scheduled:   No   pharmacy is asking for a Dx code along with a 90 day supply.   Last ordered: 12/22/2018  #75  6 refills by Dr. Nolon Rod   Requested Prescriptions  Pending Prescriptions Disp Refills   sertraline (ZOLOFT) 100 MG tablet [Pharmacy Med Name: SERTRALINE HCL 100 MG TABLET] 225 tablet 3    Sig: Take 2 and 1/2 tablets by mouth daily     Psychiatry:  Antidepressants - SSRI Failed - 01/20/2019  8:32 AM      Failed - Valid encounter within last 6 months    Recent Outpatient Visits          8 months ago Major depressive disorder, remission status unspecified, unspecified whether recurrent   Primary Care at [facility], Gelene Mink, PA-C   1 year ago Type 2 diabetes mellitus with other circulatory complication, with long-term current use of insulin [facility])   Primary Care at [facility], Gelene Mink, PA-C   1 year ago Type 2 diabetes mellitus with other circulatory complication, with long-term current use of insulin [facility])   Primary Care at [facility], Gelene Mink, PA-C   1 year ago Lower respiratory infection   Primary Care at [facility] [place], PA-C   2 years ago Cough   Primary Care at [place], PA-C             Failed - Completed PHQ-2 or PHQ-9 in the last 360 days.

## 2019-01-25 ENCOUNTER — Other Ambulatory Visit: Payer: Self-pay

## 2019-01-25 ENCOUNTER — Ambulatory Visit (HOSPITAL_COMMUNITY)
Admission: RE | Admit: 2019-01-25 | Discharge: 2019-01-25 | Disposition: A | Discharge status: Home / Self Care | Payer: Medicare Other | Source: Ambulatory Visit | Attending: Cardiovascular Disease | Admitting: Cardiovascular Disease

## 2019-01-25 DIAGNOSIS — I6523 Occlusion and stenosis of bilateral carotid arteries: Secondary | ICD-10-CM

## 2019-01-26 ENCOUNTER — Other Ambulatory Visit: Payer: Self-pay | Admitting: Cardiovascular Disease

## 2019-01-26 DIAGNOSIS — I6523 Occlusion and stenosis of bilateral carotid arteries: Secondary | ICD-10-CM

## 2019-01-27 ENCOUNTER — Other Ambulatory Visit: Payer: Self-pay | Admitting: *Deleted

## 2019-01-27 DIAGNOSIS — I6523 Occlusion and stenosis of bilateral carotid arteries: Secondary | ICD-10-CM

## 2019-01-31 ENCOUNTER — Other Ambulatory Visit: Payer: Self-pay | Admitting: *Deleted

## 2019-01-31 DIAGNOSIS — I6523 Occlusion and stenosis of bilateral carotid arteries: Secondary | ICD-10-CM

## 2019-02-03 ENCOUNTER — Other Ambulatory Visit: Payer: Self-pay | Admitting: Cardiovascular Disease

## 2019-02-10 ENCOUNTER — Ambulatory Visit: Payer: Medicare Other | Admitting: Physician Assistant

## 2019-02-10 ENCOUNTER — Telehealth: Payer: Self-pay | Admitting: Cardiovascular Disease

## 2019-02-10 MED ORDER — CLOPIDOGREL BISULFATE 75 MG PO TABS: 75.0000 mg | ORAL_TABLET | Freq: Every day | ORAL | 0 refills | Status: DC

## 2019-02-10 NOTE — Telephone Encounter (Signed)
Spoke with pt, Follow up scheduled and Refill sent to the pharmacy electronically.

## 2019-02-10 NOTE — Telephone Encounter (Signed)
Please advise if OK to refill. Last seen 11/2016. 

## 2019-02-10 NOTE — Telephone Encounter (Signed)
New Message      *STAT* If patient is at the pharmacy, call can be transferred to refill team.   1. Which medications need to be refilled? (please list name of each medication and dose if known) Plavix   2. Which pharmacy/location (including street and city if local pharmacy) is medication to be sent to? [facility] on [address]   3. Do they need a 30 day or 90 day supply? 30 day

## 2019-02-17 ENCOUNTER — Other Ambulatory Visit: Payer: Self-pay | Admitting: Family Medicine

## 2019-02-17 ENCOUNTER — Other Ambulatory Visit: Payer: Self-pay | Admitting: Cardiovascular Disease

## 2019-02-17 DIAGNOSIS — I10 Essential (primary) hypertension: Secondary | ICD-10-CM

## 2019-02-17 NOTE — Telephone Encounter (Signed)
Requested medication (s) are due for refill today: yes  Requested medication (s) are on the active medication list: yes  Last refill:  11/27/2018  Future visit scheduled: noNotes to clinic:  Review for refill  Requested Prescriptions  Pending Prescriptions Disp Refills   isosorbide mononitrate (IMDUR) 60 MG 24 hr tablet [Pharmacy Med Name: ISOSORBIDE MONONIT ER 60 MG TB] 135 tablet 0    Sig: TAKE 1 AND 1/2 TABLETS DAILY BY MOUTH     Cardiovascular:  Nitrates Passed - 02/17/2019  2:22 PM      Passed - Last BP in normal range    BP Readings from Last 1 Encounters:  05/06/18 132/82         Passed - Last Heart Rate in normal range    Pulse Readings from Last 1 Encounters:  05/06/18 87         Passed - Valid encounter within last 12 months    Recent Outpatient Visits          9 months ago Major depressive disorder, remission status unspecified, unspecified whether recurrent   Primary Care at [facility], Gelene Mink, PA-C   1 year ago Type 2 diabetes mellitus with other circulatory complication, with long-term current use of insulin [facility])   Primary Care at [facility], Gelene Mink, PA-C   1 year ago Type 2 diabetes mellitus with other circulatory complication, with long-term current use of insulin [facility]   Primary Care at [facility], Gelene Mink, PA-C   1 year ago Lower respiratory infection   Primary Care at [facility], Gelene Mink, PA-C   2 years ago Cough   Primary [facility] at [facility], Audrie Lia, PA-C      Future Appointments            In 1 week Kroeger, Lorelee Cover., PA-C [place], [facility]

## 2019-03-01 NOTE — Progress Notes (Signed)
Cardiology Office Note   Date:  03/02/2019   ID:  Jason [name], DOB 11/06/1950, MRN [id number]  PCP:  Patient, [name]  Cardiologist:  Shelva Majestic, MD EP: None  Chief Complaint  Patient presents with  . Follow-up    CAD      History of Present Illness: Jason [name] is a 68 y.o. male with PMH of CAD s/p CABG in 2011 (occluded SVG-PDA on cath in 2015 and subsequent PCI/DES to SVG-OM2 in 2018), PVD, carotid artery disease, HTN, HLD, DM type 2, and tobacco abuse, who presents for routine follow-up.   He was last evaluated by cardiology at an outpatient visit with Bernerd Pho, PA-C 11/2016, at which time he was doing well after recent hospitalization 10/2016 for chest pain. He reported difficulty affording his medications at that time and was given samples of Renexa to take in addition to his aspirin, plavix, atorvastatin, imdur, and metoprolol succinate. He was recommended to follow-up with [name] in 3 months ,however was lost to follow-up. His last ischemic evaluation was LHC in 2018 which revealed severe 3 vessel obstructive CAD with patent LIMA to LAD and SVG to OM1, chronically occluded SVG to RCA, and thrombotic lesion in SVG to OM2 which was managed with PCI/DES. His last echocardiogram in 2018 showed EF 60-65%, G1DD, severe focal calcification involving he left coronary and non-coronary cusp with severely restricted non-coronary cusp mobility without stenosis. His last carotid dopplers 01/2019 showed chronically occluded left ICA and stable 50-75% stenosis of the right ICA.   He returns today for routine follow-up. He reports having insurance now which has made it easier for him to afford his medications and follow-up visits. He feels he is doing okay from a cardiac standpoint. His main concern at this time is chronic back pain and arthritis in his hands. He does have angina but he reports this has been stable for the past year. He reports taking a couple doses of SL  nitro per week which always relieves his symptoms. No complaints of CP or SOB at rest. He does have occasional LE edema. He sleeps in a recliner due to back pain. No complaints of PND, dizziness, lightheadedness, palpitations, or syncope.     Past Medical History:  Diagnosis Date  . Anxiety    off xanax  and paxil since 3/13  . Arthritis   . Bilateral carotid artery disease ([place])    s/p R ICA stent 03/28/2015 with distal protection. Known chronically occluded L ICA. Carotid stent complicated by hypotension and acute stroke [place]  . Cancer ([place])    tumor basal cell rem from lft arm  . Cataract   . COPD (chronic obstructive pulmonary disease) ([place])   . Coronary artery disease    a. s/p CABG in 2011 with LIMA-LAD, SVG-RI/OM, and SVG-PDA b. occluded SVG-PDA by cath in 2015 c. 10/2016: NSTEMI with cath showing thrombus along the distal graft to insertion of SVG-OM2 with DES placed.   . CVA (cerebral infarction)    occured on 10/10 several days after R ICA carotid stenting  . Depression   . Diabetes mellitus   . GERD (gastroesophageal reflux disease)   . Heart attack ([place])   . High cholesterol   . Hypertension    type 2 NIDDM  . Myocardial infarct (HCC)    x4 last 10 yrs  . Pneumonia    hx  . 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  Smoker   . Substance abuse [facility])     Past Surgical History:  Procedure Laterality Date  . BACK SURGERY  Jan 2014, Dec 2011   Dr Vertell Limber  . CARDIAC CATHETERIZATION  4/12   Medical Rx  . CORONARY ANGIOGRAM  01/17/14   med rx  . CORONARY ANGIOGRAM  4/13   med Rx  . CORONARY ANGIOGRAM  1/15   Med Rx  . CORONARY ANGIOPLASTY  Jan 2004   RCA  . CORONARY ARTERY BYPASS GRAFT  07/24/2009   L-LAD, SVG-RI/OM, SVG-PDA  . CORONARY STENT INTERVENTION N/A 10/23/2016   Procedure: Coronary Stent Intervention;  Surgeon: Peter M Martinique, MD;  Location: [place] CV LAB;  Service: Cardiovascular;  Laterality: N/A;  .  EYE SURGERY     cat bil  . FEMORAL ARTERY - FEMORAL ARTERY BYPASS GRAFT Right 2004   femoral enarterectomy  . LEFT HEART CATH AND CORS/GRAFTS ANGIOGRAPHY N/A 10/23/2016   Procedure: Left Heart Cath and Cors/Grafts Angiography;  Surgeon: Peter M Martinique, MD;  Location: [place] CV LAB;  Service: Cardiovascular;  Laterality: N/A;  . LEFT HEART CATHETERIZATION WITH CORONARY ANGIOGRAM N/A 10/03/2011   Procedure: LEFT HEART CATHETERIZATION WITH CORONARY ANGIOGRAM;  Surgeon: Pixie Casino, MD;  Location: [facility] CATH LAB;  Service: Cardiovascular;  Laterality: N/A;  . LEFT HEART CATHETERIZATION WITH CORONARY ANGIOGRAM N/A 07/08/2013   Procedure: LEFT HEART CATHETERIZATION WITH CORONARY ANGIOGRAM;  Surgeon: Blane Ohara, MD;  Location: [facility] CATH LAB;  Service: Cardiovascular;  Laterality: N/A;  . LEFT HEART CATHETERIZATION WITH CORONARY/GRAFT ANGIOGRAM N/A 01/17/2014   Procedure: LEFT HEART CATHETERIZATION WITH Beatrix Fetters;  Surgeon: Troy Sine, MD;  Location: [facility] CATH LAB;  Service: Cardiovascular;  Laterality: N/A;  . PERIPHERAL VASCULAR CATHETERIZATION N/A 03/28/2015   Procedure: Carotid PTA/Stent Intervention;  Surgeon: Lorretta Harp, MD;  Location: [place] CV LAB;  Service: Cardiovascular;  Laterality: N/A;  . US EXTREMITY*L*     lft arm tumor removed      Current Outpatient Medications  Medication Sig Dispense Refill  . acetaminophen (TYLENOL) 325 MG tablet Take 2 tablets (650 mg total) by mouth every 4 (four) hours as needed for headache or mild pain.    Marland Kitchen albuterol (PROVENTIL) (2.5 MG/3ML) 0.083% nebulizer solution USE 1 VIAL IN NEBULIZER EVERY 6 HOURS AS NEEDED 150 mL 1  . aspirin 81 MG tablet Take 1 tablet (81 mg total) by mouth daily.    . clopidogrel (PLAVIX) 75 MG tablet Take 1 tablet (75 mg total) by mouth daily. 90 tablet 1  . isosorbide mononitrate (IMDUR) 120 MG 24 hr tablet Take 1 tablet (120 mg total) by mouth daily. 90 tablet 1  . levothyroxine (SYNTHROID,  LEVOTHROID) 50 MCG tablet TAKE 1 TABLET BY MOUTH EVERY DAY 30 tablet 0  . loratadine (CLARITIN) 10 MG tablet Take 10 mg by mouth daily.    . metFORMIN (GLUCOPHAGE) 1000 MG tablet TAKE 1 TABLET BY MOUTH TWICE A DAY WITH A MEAL 180 tablet 3  . metoprolol succinate (TOPROL-XL) 100 MG 24 hr tablet TAKE 1 TABLET BY MOUTH DAILY. CALL OUR OFFICE FOR AN APPOINTMENT 90 tablet 1  . nitroGLYCERIN (NITROSTAT) 0.4 MG SL tablet PLACE 1 TABLET (0.4 MG TOTAL) UNDER THE TONGUE EVERY 5 (FIVE) MINUTES AS NEEDED FOR CHEST PAIN. 25 tablet 6  . ranolazine (RANEXA) 1000 MG SR tablet TAKE 1 TABLET BY MOUTH 2 TIMES DAILY. NEED OV. 180 tablet 1  . sertraline (ZOLOFT) 100 MG tablet Take 2 and  1/2 tablets by mouth daily 75 tablet 6  . ranitidine (ZANTAC) 150 MG tablet Take 150 mg by mouth daily as needed for heartburn. For acid reflux    . rosuvastatin (CRESTOR) 10 MG tablet Take 1 tablet (10 mg total) by mouth daily. 30 tablet 6   No current facility-administered medications for this visit.     Allergies:   Coreg [carvedilol] and Sulfa antibiotics    Social History:  The patient  reports that he has been smoking cigarettes. He has a 48.00 pack-year smoking history. He has never used smokeless tobacco. He reports current alcohol use. He reports that he does not use drugs.   Family History:  The patient's family history includes Arthritis in his mother; Heart disease in his father.    ROS:  Please see the history of present illness.   Otherwise, review of systems are positive for none.   All other systems are reviewed and negative.    PHYSICAL EXAM: VS:  BP (!) 120/56 (BP Location: Left Arm, Patient Position: Sitting, Cuff Size: Normal)   Pulse 81   Ht 5' 7.5" (1.715 m)   Wt 170 lb 3.2 oz (77.2 kg)   BMI 26.26 kg/m  , BMI Body mass index is 26.26 kg/m. GEN: Appears older than stated age, sitting on exam table in NAD HEENT: Sclera anicteric Neck: no JVD, carotid bruits, or masses Cardiac: RRR; no murmurs, rubs,  or gallops, trace LE edema  Respiratory:  clear to auscultation bilaterally, normal work of breathing GI: soft, nontender, nondistended, + BS MS: no deformity or atrophy Skin: warm and dry, no rash Neuro:  Strength and sensation are intact Psych: euthymic mood, full affect   EKG:  EKG is ordered today. The ekg ordered today demonstrates sinus rhythm with chronic RBBB and LPFB, no STE/D, no TWI, no significant change from previous   Recent Labs: No results found for requested labs within last 8760 hours.    Lipid Panel    Component Value Date/Time   CHOL 324 (H) 05/06/2018 1640   TRIG 391 (H) 05/06/2018 1640   HDL 41 05/06/2018 1640   CHOLHDL 7.9 (H) 05/06/2018 1640   CHOLHDL 6.1 10/23/2016 0528   VLDL UNABLE TO CALCULATE IF TRIGLYCERIDE OVER 400 mg/dL 10/23/2016 0528   LDLCALC 205 (H) 05/06/2018 1640      Wt Readings from Last 3 Encounters:  03/02/19 170 lb 3.2 oz (77.2 kg)  05/06/18 163 lb 6.4 oz (74.1 kg)  12/30/17 166 lb 6.4 oz (75.5 kg)      Other studies Reviewed: Additional studies/ records that were reviewed today include:   Echocardiogram 08/2016: Study Conclusions  - Left ventricle: The cavity size was normal. There was mild focal   basal hypertrophy of the septum. Systolic function was normal.   The estimated ejection fraction was in the range of 60% to 65%.   Wall motion was normal; there were no regional wall motion   abnormalities. There was an increased relative contribution of   atrial contraction to ventricular filling. Doppler parameters are   consistent with abnormal left ventricular relaxation (grade 1   diastolic dysfunction). - Aortic valve: Severe focal calcification involving the left   coronary and noncoronary cusp. Noncoronary cusp mobility was   severely restricted. - Mitral valve: Calcified annulus. - Atrial septum: There was increased thickness of the septum,   consistent with lipomatous hypertrophy. - Pulmonary arteries: Systolic  pressure could not be accurately   estimated.  Left heart catheterization 10/2016:  Prox RCA to Mid RCA lesion, 100 %stenosed.  Ost LAD to Dist LAD lesion, 75 %stenosed.  1st Diag lesion, 50 %stenosed.  Ost 1st Mrg to 1st Mrg lesion, 100 %stenosed.  Ost Cx to Prox Cx lesion, 99 %stenosed.  Ost 2nd Mrg to 2nd Mrg lesion, 100 %stenosed.  SVG.  Origin lesion, 100 %stenosed.  Seq SVG-.  LIMA graft was visualized by angiography and is normal in caliber and anatomically normal.  LV end diastolic pressure is moderately elevated.  A STENT RESOLUTE ONYX [id number] drug eluting stent was successfully placed.  Dist Graft to Insertion lesion, 95 %stenosed.  Post intervention, there is a 0% residual stenosis.   1. Severe 3 vessel obstructive CAD. 2. Patent LIMA to the LAD 3. Patent SVG sequentially to OM1 and OM2 but with critical thrombotic lesion in the body of the SVG 4. Occluded SVG to the RCA- chronic 5. Moderately elevated LVEDP 6. Successful stenting of SVG to OM 2. Procedure complicated by no reflow phenomena and occlusion of the first OM.   Plan: IV Ntg overnight. Analgesics. Check Echo tomorrow. Serial troponins. Continue DAPT indefinitely.      ASSESSMENT AND PLAN:  1. CAD s/p CABG with subsequent PCI/DES to SVG to OM2 in 2018: patient reports stable angina relieved with SL nitro  - Patient has been taking aspirin 325mg  daily - recommended decreasing dose to 81mg  daily going forward - Continue plavix and statin - Will increase imdur to 120mg  daily given regular SL nitro use.  - Continue metoprolol and renexa  2. HTN: BP 120/56 today - Managed in the context of #1.   3. HLD: LDL poorly controlled at 205 on lipids 04/2018 with goal <70. Reported to have difficulty taking atorvastatin 2/2 side effects. Currently not on a statin - Will start crestor 10mg  daily with plans to uptitrate to max tolerated dose - Will check FLP and LFTs in 6-8wks   4. Carotid artery  disease: known chronically occluded left ICA and stable 50-75% stenosis of the right ICA on last duplex 01/2019 - Plan to repeat doppler in 1 year  5. DM type 2:  A1C 6.6 04/2018; at goal of <7 - Continue metformin  6. Tobacco abuse: still smoking. - Continue to encourage cessation    Current medicines are reviewed at length with the patient today.  The patient does not have concerns regarding medicines.  The following changes have been made:  Start crestor 10mg  daily. Increase imdur to 120mg  daily  Labs/ tests ordered today include:   Orders Placed This Encounter  Procedures  . Lipid Profile  . Hepatic function panel  . EKG 12-Lead     Disposition:   FU with [name] in 6 months  Signed, Abigail Butts, PA-C  03/02/2019 3:22 PM

## 2019-03-02 ENCOUNTER — Ambulatory Visit (INDEPENDENT_AMBULATORY_CARE_PROVIDER_SITE_OTHER): Payer: Medicare Other | Admitting: Medical

## 2019-03-02 ENCOUNTER — Encounter: Payer: Self-pay | Admitting: Medical

## 2019-03-02 ENCOUNTER — Other Ambulatory Visit: Payer: Self-pay

## 2019-03-02 VITALS — BP 120/56 | HR 81 | Ht 67.5 in | Wt 170.2 lb

## 2019-03-02 DIAGNOSIS — E782 Mixed hyperlipidemia: Secondary | ICD-10-CM | POA: Diagnosis not present

## 2019-03-02 DIAGNOSIS — E1159 Type 2 diabetes mellitus with other circulatory complications: Secondary | ICD-10-CM

## 2019-03-02 DIAGNOSIS — Z79899 Other long term (current) drug therapy: Secondary | ICD-10-CM

## 2019-03-02 DIAGNOSIS — Z794 Long term (current) use of insulin: Secondary | ICD-10-CM

## 2019-03-02 DIAGNOSIS — I10 Essential (primary) hypertension: Secondary | ICD-10-CM | POA: Diagnosis not present

## 2019-03-02 DIAGNOSIS — Z720 Tobacco use: Secondary | ICD-10-CM

## 2019-03-02 DIAGNOSIS — I25118 Atherosclerotic heart disease of native coronary artery with other forms of angina pectoris: Secondary | ICD-10-CM

## 2019-03-02 MED ORDER — NITROGLYCERIN 0.4 MG SL SUBL: SUBLINGUAL_TABLET | SUBLINGUAL | 6 refills | Status: DC

## 2019-03-02 MED ORDER — RANOLAZINE ER 1000 MG PO TB12: ORAL_TABLET | ORAL | 1 refills | Status: DC

## 2019-03-02 MED ORDER — METOPROLOL SUCCINATE ER 100 MG PO TB24: ORAL_TABLET | ORAL | 1 refills | Status: DC

## 2019-03-02 MED ORDER — CLOPIDOGREL BISULFATE 75 MG PO TABS: 75.0000 mg | ORAL_TABLET | Freq: Every day | ORAL | 1 refills | Status: DC

## 2019-03-02 MED ORDER — ROSUVASTATIN CALCIUM 10 MG PO TABS: 10.0000 mg | ORAL_TABLET | Freq: Every day | ORAL | 6 refills | Status: DC

## 2019-03-02 MED ORDER — ISOSORBIDE MONONITRATE ER 120 MG PO TB24: 120.0000 mg | ORAL_TABLET | Freq: Every day | ORAL | 1 refills | Status: DC

## 2019-03-02 NOTE — Patient Instructions (Signed)
Medication Instructions:  INCREASE ISOSORBIDE 120MG  DAILY START CRESTOR 10MG  IN THE EVENING  TAKE ASPIRING 81MG  DAILY If you need a refill on your cardiac medications before your next appointment, please call your pharmacy.  Labwork: FASTING LIPIDS, LFT IN 2 MONTHS HERE IN OUR OFFICE AT [facility]   You will need to fast. DO NOT EAT OR DRINK PAST MIDNIGHT.       Take the provided lab slips with you to the lab for your blood draw.   When you have your labs (blood work) drawn today and your tests are completely normal, you will receive your results only by MyChart Message (if you have MyChart) -OR-  A paper copy in the mail.  If you have any lab test that is abnormal or we need to change your treatment, we will call you to review these results.  Follow-Up: You will need a follow up appointment in 6 months.  Please call our office 3 months in advance to schedule, December 2020 this appointment.  You may see Shelva Majestic, MD Roby Lofts, PA-C or one of the following Advanced Practice Providers on your designated Care Team: [place], [place] . Fabian Sharp, PA-C     At [facility], you and your health needs are our priority.  As part of our continuing mission to provide you with exceptional heart care, we have created designated Provider Care Teams.  These Care Teams include your primary Cardiologist (physician) and Advanced Practice Providers (APPs -  Physician Assistants and Nurse Practitioners) who all work together to provide you with the care you need, when you need it.  Thank you for choosing [facility] at [facility]!!

## 2019-04-05 ENCOUNTER — Other Ambulatory Visit: Payer: Self-pay | Admitting: Family Medicine

## 2019-04-05 DIAGNOSIS — E118 Type 2 diabetes mellitus with unspecified complications: Secondary | ICD-10-CM

## 2019-04-18 ENCOUNTER — Telehealth: Payer: Self-pay | Admitting: *Deleted

## 2019-04-18 NOTE — Telephone Encounter (Signed)
Schedule AWV.  

## 2019-05-27 ENCOUNTER — Inpatient Hospital Stay (HOSPITAL_BASED_OUTPATIENT_CLINIC_OR_DEPARTMENT_OTHER)
Admission: EM | Admit: 2019-05-27 | Discharge: 2019-06-07 | DRG: 854 | Disposition: A | Discharge status: Skilled Nursing Facility | Payer: Medicare Other | Attending: Family Medicine | Admitting: Family Medicine

## 2019-05-27 ENCOUNTER — Emergency Department (HOSPITAL_BASED_OUTPATIENT_CLINIC_OR_DEPARTMENT_OTHER): Payer: Medicare Other

## 2019-05-27 ENCOUNTER — Other Ambulatory Visit: Payer: Self-pay

## 2019-05-27 ENCOUNTER — Encounter (HOSPITAL_BASED_OUTPATIENT_CLINIC_OR_DEPARTMENT_OTHER): Payer: Self-pay

## 2019-05-27 DIAGNOSIS — I452 Bifascicular block: Secondary | ICD-10-CM | POA: Diagnosis present

## 2019-05-27 DIAGNOSIS — E1165 Type 2 diabetes mellitus with hyperglycemia: Secondary | ICD-10-CM | POA: Diagnosis present

## 2019-05-27 DIAGNOSIS — A419 Sepsis, unspecified organism: Principal | ICD-10-CM | POA: Diagnosis present

## 2019-05-27 DIAGNOSIS — Z951 Presence of aortocoronary bypass graft: Secondary | ICD-10-CM | POA: Diagnosis not present

## 2019-05-27 DIAGNOSIS — Z91128 Patient's intentional underdosing of medication regimen for other reason: Secondary | ICD-10-CM | POA: Diagnosis not present

## 2019-05-27 DIAGNOSIS — E7800 Pure hypercholesterolemia, unspecified: Secondary | ICD-10-CM | POA: Diagnosis present

## 2019-05-27 DIAGNOSIS — I25810 Atherosclerosis of coronary artery bypass graft(s) without angina pectoris: Secondary | ICD-10-CM | POA: Diagnosis present

## 2019-05-27 DIAGNOSIS — D62 Acute posthemorrhagic anemia: Secondary | ICD-10-CM | POA: Diagnosis not present

## 2019-05-27 DIAGNOSIS — Y92009 Unspecified place in unspecified non-institutional (private) residence as the place of occurrence of the external cause: Secondary | ICD-10-CM | POA: Diagnosis not present

## 2019-05-27 DIAGNOSIS — M7989 Other specified soft tissue disorders: Secondary | ICD-10-CM | POA: Diagnosis not present

## 2019-05-27 DIAGNOSIS — I998 Other disorder of circulatory system: Secondary | ICD-10-CM | POA: Diagnosis present

## 2019-05-27 DIAGNOSIS — L03115 Cellulitis of right lower limb: Secondary | ICD-10-CM | POA: Diagnosis present

## 2019-05-27 DIAGNOSIS — Z01810 Encounter for preprocedural cardiovascular examination: Secondary | ICD-10-CM | POA: Diagnosis not present

## 2019-05-27 DIAGNOSIS — Z7902 Long term (current) use of antithrombotics/antiplatelets: Secondary | ICD-10-CM

## 2019-05-27 DIAGNOSIS — F419 Anxiety disorder, unspecified: Secondary | ICD-10-CM | POA: Diagnosis present

## 2019-05-27 DIAGNOSIS — F17210 Nicotine dependence, cigarettes, uncomplicated: Secondary | ICD-10-CM | POA: Diagnosis present

## 2019-05-27 DIAGNOSIS — Z8261 Family history of arthritis: Secondary | ICD-10-CM

## 2019-05-27 DIAGNOSIS — L03031 Cellulitis of right toe: Secondary | ICD-10-CM | POA: Diagnosis not present

## 2019-05-27 DIAGNOSIS — L03818 Cellulitis of other sites: Secondary | ICD-10-CM

## 2019-05-27 DIAGNOSIS — I10 Essential (primary) hypertension: Secondary | ICD-10-CM | POA: Diagnosis present

## 2019-05-27 DIAGNOSIS — L97509 Non-pressure chronic ulcer of other part of unspecified foot with unspecified severity: Secondary | ICD-10-CM | POA: Diagnosis present

## 2019-05-27 DIAGNOSIS — E860 Dehydration: Secondary | ICD-10-CM | POA: Diagnosis present

## 2019-05-27 DIAGNOSIS — L97518 Non-pressure chronic ulcer of other part of right foot with other specified severity: Secondary | ICD-10-CM

## 2019-05-27 DIAGNOSIS — Z888 Allergy status to other drugs, medicaments and biological substances status: Secondary | ICD-10-CM

## 2019-05-27 DIAGNOSIS — K219 Gastro-esophageal reflux disease without esophagitis: Secondary | ICD-10-CM | POA: Diagnosis present

## 2019-05-27 DIAGNOSIS — L089 Local infection of the skin and subcutaneous tissue, unspecified: Secondary | ICD-10-CM | POA: Diagnosis not present

## 2019-05-27 DIAGNOSIS — J449 Chronic obstructive pulmonary disease, unspecified: Secondary | ICD-10-CM | POA: Diagnosis present

## 2019-05-27 DIAGNOSIS — Z20828 Contact with and (suspected) exposure to other viral communicable diseases: Secondary | ICD-10-CM | POA: Diagnosis present

## 2019-05-27 DIAGNOSIS — R739 Hyperglycemia, unspecified: Secondary | ICD-10-CM

## 2019-05-27 DIAGNOSIS — E1152 Type 2 diabetes mellitus with diabetic peripheral angiopathy with gangrene: Secondary | ICD-10-CM | POA: Diagnosis present

## 2019-05-27 DIAGNOSIS — I6522 Occlusion and stenosis of left carotid artery: Secondary | ICD-10-CM | POA: Diagnosis present

## 2019-05-27 DIAGNOSIS — Z7982 Long term (current) use of aspirin: Secondary | ICD-10-CM

## 2019-05-27 DIAGNOSIS — Z85828 Personal history of other malignant neoplasm of skin: Secondary | ICD-10-CM

## 2019-05-27 DIAGNOSIS — L0390 Cellulitis, unspecified: Secondary | ICD-10-CM | POA: Diagnosis present

## 2019-05-27 DIAGNOSIS — I252 Old myocardial infarction: Secondary | ICD-10-CM | POA: Diagnosis not present

## 2019-05-27 DIAGNOSIS — Z955 Presence of coronary angioplasty implant and graft: Secondary | ICD-10-CM

## 2019-05-27 DIAGNOSIS — F329 Major depressive disorder, single episode, unspecified: Secondary | ICD-10-CM | POA: Diagnosis present

## 2019-05-27 DIAGNOSIS — E871 Hypo-osmolality and hyponatremia: Secondary | ICD-10-CM | POA: Diagnosis present

## 2019-05-27 DIAGNOSIS — I96 Gangrene, not elsewhere classified: Secondary | ICD-10-CM

## 2019-05-27 DIAGNOSIS — I739 Peripheral vascular disease, unspecified: Secondary | ICD-10-CM | POA: Diagnosis present

## 2019-05-27 DIAGNOSIS — Z882 Allergy status to sulfonamides status: Secondary | ICD-10-CM

## 2019-05-27 DIAGNOSIS — Z79899 Other long term (current) drug therapy: Secondary | ICD-10-CM

## 2019-05-27 DIAGNOSIS — E039 Hypothyroidism, unspecified: Secondary | ICD-10-CM | POA: Diagnosis present

## 2019-05-27 DIAGNOSIS — Z8673 Personal history of transient ischemic attack (TIA), and cerebral infarction without residual deficits: Secondary | ICD-10-CM | POA: Diagnosis not present

## 2019-05-27 DIAGNOSIS — E0800 Diabetes mellitus due to underlying condition with hyperosmolarity without nonketotic hyperglycemic-hyperosmolar coma (NKHHC): Secondary | ICD-10-CM | POA: Diagnosis not present

## 2019-05-27 DIAGNOSIS — Z8249 Family history of ischemic heart disease and other diseases of the circulatory system: Secondary | ICD-10-CM

## 2019-05-27 DIAGNOSIS — E872 Acidosis: Secondary | ICD-10-CM | POA: Diagnosis present

## 2019-05-27 DIAGNOSIS — Z9842 Cataract extraction status, left eye: Secondary | ICD-10-CM

## 2019-05-27 DIAGNOSIS — E11621 Type 2 diabetes mellitus with foot ulcer: Secondary | ICD-10-CM | POA: Diagnosis present

## 2019-05-27 DIAGNOSIS — I4510 Unspecified right bundle-branch block: Secondary | ICD-10-CM | POA: Diagnosis present

## 2019-05-27 DIAGNOSIS — T383X6A Underdosing of insulin and oral hypoglycemic [antidiabetic] drugs, initial encounter: Secondary | ICD-10-CM | POA: Diagnosis present

## 2019-05-27 DIAGNOSIS — Z7984 Long term (current) use of oral hypoglycemic drugs: Secondary | ICD-10-CM

## 2019-05-27 DIAGNOSIS — E11628 Type 2 diabetes mellitus with other skin complications: Secondary | ICD-10-CM | POA: Diagnosis present

## 2019-05-27 DIAGNOSIS — I2510 Atherosclerotic heart disease of native coronary artery without angina pectoris: Secondary | ICD-10-CM | POA: Diagnosis present

## 2019-05-27 DIAGNOSIS — Z79890 Hormone replacement therapy (postmenopausal): Secondary | ICD-10-CM

## 2019-05-27 DIAGNOSIS — E119 Type 2 diabetes mellitus without complications: Secondary | ICD-10-CM

## 2019-05-27 DIAGNOSIS — E0810 Diabetes mellitus due to underlying condition with ketoacidosis without coma: Secondary | ICD-10-CM | POA: Diagnosis not present

## 2019-05-27 DIAGNOSIS — Z9841 Cataract extraction status, right eye: Secondary | ICD-10-CM

## 2019-05-27 DIAGNOSIS — M79609 Pain in unspecified limb: Secondary | ICD-10-CM | POA: Diagnosis not present

## 2019-05-27 LAB — BASIC METABOLIC PANEL
Anion gap: 12 (ref 5–15)
BUN: 10 mg/dL (ref 8–23)
CO2: 18 mmol/L — ABNORMAL LOW (ref 22–32)
Calcium: 9.4 mg/dL (ref 8.9–10.3)
Chloride: 95 mmol/L — ABNORMAL LOW (ref 98–111)
Creatinine, Ser: 0.88 mg/dL (ref 0.61–1.24)
GFR calc Af Amer: >60 mL/min (ref >60)
GFR calc non Af Amer: >60 mL/min (ref >60)
Glucose, Bld: 265 mg/dL — ABNORMAL HIGH (ref 70–99)
Potassium: 4.2 mmol/L (ref 3.5–5.1)
Sodium: 125 mmol/L — ABNORMAL LOW (ref 135–145)

## 2019-05-27 LAB — CBC
HCT: 36.6 % — ABNORMAL LOW (ref 39.0–52.0)
Hemoglobin: 12 g/dL — ABNORMAL LOW (ref 13.0–17.0)
MCH: 31.5 pg (ref 26.0–34.0)
MCHC: 32.8 g/dL (ref 30.0–36.0)
MCV: 96.1 fL (ref 80.0–100.0)
Platelets: 275 10*3/uL (ref 150–400)
RBC: 3.81 MIL/uL — ABNORMAL LOW (ref 4.22–5.81)
RDW: 12.3 % (ref 11.5–15.5)
WBC: 11.6 10*3/uL — ABNORMAL HIGH (ref 4.0–10.5)
nRBC: 0 % (ref 0.0–0.2)

## 2019-05-27 LAB — CBC WITH DIFFERENTIAL/PLATELET
Abs Immature Granulocytes: 0.18 10*3/uL — ABNORMAL HIGH (ref 0.00–0.07)
Basophils Absolute: 0.1 10*3/uL (ref 0.0–0.1)
Basophils Relative: 0 %
Eosinophils Absolute: 0.1 10*3/uL (ref 0.0–0.5)
Eosinophils Relative: 1 %
HCT: 40 % (ref 39.0–52.0)
Hemoglobin: 13.2 g/dL (ref 13.0–17.0)
Immature Granulocytes: 1 %
Lymphocytes Relative: 17 %
Lymphs Abs: 2.3 10*3/uL (ref 0.7–4.0)
MCH: 32 pg (ref 26.0–34.0)
MCHC: 33 g/dL (ref 30.0–36.0)
MCV: 96.9 fL (ref 80.0–100.0)
Monocytes Absolute: 0.8 10*3/uL (ref 0.1–1.0)
Monocytes Relative: 6 %
Neutro Abs: 10.2 10*3/uL — ABNORMAL HIGH (ref 1.7–7.7)
Neutrophils Relative %: 75 %
Platelets: 306 10*3/uL (ref 150–400)
RBC: 4.13 MIL/uL — ABNORMAL LOW (ref 4.22–5.81)
RDW: 12.2 % (ref 11.5–15.5)
WBC: 13.6 10*3/uL — ABNORMAL HIGH (ref 4.0–10.5)
nRBC: 0 % (ref 0.0–0.2)

## 2019-05-27 LAB — HIV ANTIBODY (ROUTINE TESTING W REFLEX): HIV Screen 4th Generation wRfx: NONREACTIVE

## 2019-05-27 LAB — BETA-HYDROXYBUTYRIC ACID: Beta-Hydroxybutyric Acid: 0.06 mmol/L (ref 0.05–0.27)

## 2019-05-27 LAB — HEMOGLOBIN A1C
Hgb A1c MFr Bld: 8.5 % — ABNORMAL HIGH (ref 4.8–5.6)
Mean Plasma Glucose: 197.25 mg/dL

## 2019-05-27 LAB — CREATININE, SERUM
Creatinine, Ser: 1.02 mg/dL (ref 0.61–1.24)
GFR calc Af Amer: >60 mL/min (ref >60)
GFR calc non Af Amer: >60 mL/min (ref >60)

## 2019-05-27 LAB — SARS CORONAVIRUS 2 (TAT 6-24 HRS): SARS Coronavirus 2: NEGATIVE

## 2019-05-27 LAB — T4, FREE: Free T4: 0.82 ng/dL (ref 0.61–1.12)

## 2019-05-27 LAB — LACTIC ACID, PLASMA: Lactic Acid, Venous: 1.8 mmol/L (ref 0.5–1.9)

## 2019-05-27 LAB — GLUCOSE, CAPILLARY: Glucose-Capillary: 260 mg/dL — ABNORMAL HIGH (ref 70–99)

## 2019-05-27 LAB — TSH: TSH: 5.18 u[IU]/mL — ABNORMAL HIGH (ref 0.350–4.500)

## 2019-05-27 MED ORDER — INSULIN ASPART 100 UNIT/ML ~~LOC~~ SOLN
0.0000 [IU] | Freq: Every day | SUBCUTANEOUS | Status: DC
  Administered 2019-05-27: 3 [IU] via SUBCUTANEOUS
  Administered 2019-05-29: 2 [IU] via SUBCUTANEOUS

## 2019-05-27 MED ORDER — ISOSORBIDE MONONITRATE ER 60 MG PO TB24
120.0000 mg | ORAL_TABLET | Freq: Every day | ORAL | Status: DC
  Administered 2019-05-28 – 2019-05-29 (×2): 120 mg via ORAL
  Filled 2019-05-27 (×2): qty 2

## 2019-05-27 MED ORDER — ROSUVASTATIN CALCIUM 5 MG PO TABS
10.0000 mg | ORAL_TABLET | Freq: Every day | ORAL | Status: DC
  Administered 2019-05-28 – 2019-06-07 (×11): 10 mg via ORAL
  Filled 2019-05-27: qty 1
  Filled 2019-05-27 (×6): qty 2
  Filled 2019-05-27: qty 1
  Filled 2019-05-27 (×3): qty 2

## 2019-05-27 MED ORDER — ACETAMINOPHEN 325 MG PO TABS
650.0000 mg | ORAL_TABLET | Freq: Four times a day (QID) | ORAL | Status: DC | PRN
  Administered 2019-06-01: 650 mg via ORAL
  Filled 2019-05-27 (×2): qty 2

## 2019-05-27 MED ORDER — HYDROCODONE-ACETAMINOPHEN 5-325 MG PO TABS
1.0000 | ORAL_TABLET | ORAL | Status: DC | PRN
  Administered 2019-05-29 – 2019-06-03 (×7): 2 via ORAL
  Filled 2019-05-27 (×8): qty 2

## 2019-05-27 MED ORDER — OXYCODONE-ACETAMINOPHEN 5-325 MG PO TABS
1.0000 | ORAL_TABLET | Freq: Four times a day (QID) | ORAL | Status: DC | PRN
  Administered 2019-05-27 – 2019-06-01 (×17): 2 via ORAL
  Filled 2019-05-27 (×17): qty 2

## 2019-05-27 MED ORDER — METOPROLOL SUCCINATE ER 50 MG PO TB24
100.0000 mg | ORAL_TABLET | Freq: Every day | ORAL | Status: DC
  Administered 2019-05-27 – 2019-05-28 (×2): 100 mg via ORAL
  Filled 2019-05-27 (×3): qty 2

## 2019-05-27 MED ORDER — CLOPIDOGREL BISULFATE 75 MG PO TABS
75.0000 mg | ORAL_TABLET | Freq: Every day | ORAL | Status: DC
  Administered 2019-05-27 – 2019-06-07 (×12): 75 mg via ORAL
  Filled 2019-05-27 (×12): qty 1

## 2019-05-27 MED ORDER — SODIUM CHLORIDE 0.9 % IV SOLN
INTRAVENOUS | Status: DC | PRN
  Administered 2019-05-27 – 2019-06-01 (×2): via INTRAVENOUS

## 2019-05-27 MED ORDER — PIPERACILLIN-TAZOBACTAM 3.375 G IVPB 30 MIN
3.3750 g | Freq: Four times a day (QID) | INTRAVENOUS | Status: DC
  Filled 2019-05-27: qty 50

## 2019-05-27 MED ORDER — VANCOMYCIN HCL 10 G IV SOLR
1750.0000 mg | INTRAVENOUS | Status: DC
  Filled 2019-05-27: qty 1750

## 2019-05-27 MED ORDER — PIPERACILLIN-TAZOBACTAM 3.375 G IVPB
3.3750 g | Freq: Three times a day (TID) | INTRAVENOUS | Status: DC
  Administered 2019-05-27 – 2019-05-29 (×6): 3.375 g via INTRAVENOUS
  Filled 2019-05-27 (×7): qty 50

## 2019-05-27 MED ORDER — ONDANSETRON HCL 4 MG/2ML IJ SOLN: 4.0000 mg | Freq: Four times a day (QID) | INTRAMUSCULAR | Status: DC | PRN

## 2019-05-27 MED ORDER — ENOXAPARIN SODIUM 40 MG/0.4ML ~~LOC~~ SOLN: 40.0000 mg | SUBCUTANEOUS | Status: DC

## 2019-05-27 MED ORDER — ASPIRIN EC 81 MG PO TBEC
81.0000 mg | DELAYED_RELEASE_TABLET | Freq: Every day | ORAL | Status: DC
  Administered 2019-05-28 – 2019-06-07 (×11): 81 mg via ORAL
  Filled 2019-05-27 (×11): qty 1

## 2019-05-27 MED ORDER — ONDANSETRON HCL 4 MG PO TABS: 4.0000 mg | ORAL_TABLET | Freq: Four times a day (QID) | ORAL | Status: DC | PRN

## 2019-05-27 MED ORDER — ACETAMINOPHEN 650 MG RE SUPP: 650.0000 mg | Freq: Four times a day (QID) | RECTAL | Status: DC | PRN

## 2019-05-27 MED ORDER — ALBUTEROL SULFATE (2.5 MG/3ML) 0.083% IN NEBU: 2.5000 mg | INHALATION_SOLUTION | RESPIRATORY_TRACT | Status: DC | PRN

## 2019-05-27 MED ORDER — PIPERACILLIN-TAZOBACTAM 3.375 G IVPB: 3.3750 g | Freq: Three times a day (TID) | INTRAVENOUS | Status: DC

## 2019-05-27 MED ORDER — SERTRALINE HCL 50 MG PO TABS
250.0000 mg | ORAL_TABLET | Freq: Every day | ORAL | Status: DC
  Administered 2019-05-28 – 2019-06-06 (×10): 250 mg via ORAL
  Filled 2019-05-27 (×5): qty 1
  Filled 2019-05-27: qty 2
  Filled 2019-05-27: qty 1
  Filled 2019-05-27: qty 2
  Filled 2019-05-27 (×2): qty 1
  Filled 2019-05-27: qty 2

## 2019-05-27 MED ORDER — ENOXAPARIN SODIUM 40 MG/0.4ML ~~LOC~~ SOLN
40.0000 mg | SUBCUTANEOUS | Status: DC
  Administered 2019-05-27 – 2019-05-30 (×4): 40 mg via SUBCUTANEOUS
  Filled 2019-05-27 (×5): qty 0.4

## 2019-05-27 MED ORDER — PIPERACILLIN-TAZOBACTAM 3.375 G IVPB 30 MIN
3.3750 g | Freq: Once | INTRAVENOUS | Status: AC
  Administered 2019-05-27: 3.375 g via INTRAVENOUS
  Filled 2019-05-27 (×2): qty 50

## 2019-05-27 MED ORDER — NICOTINE 21 MG/24HR TD PT24
21.0000 mg | MEDICATED_PATCH | TRANSDERMAL | Status: DC
  Administered 2019-05-27 – 2019-06-06 (×11): 21 mg via TRANSDERMAL
  Filled 2019-05-27 (×11): qty 1

## 2019-05-27 MED ORDER — INSULIN ASPART 100 UNIT/ML ~~LOC~~ SOLN
0.0000 [IU] | Freq: Three times a day (TID) | SUBCUTANEOUS | Status: DC
  Administered 2019-05-28 (×2): 5 [IU] via SUBCUTANEOUS
  Administered 2019-05-28: 3 [IU] via SUBCUTANEOUS
  Administered 2019-05-29: 2 [IU] via SUBCUTANEOUS
  Administered 2019-05-29: 3 [IU] via SUBCUTANEOUS
  Administered 2019-05-29 – 2019-05-30 (×2): 5 [IU] via SUBCUTANEOUS
  Administered 2019-05-30: 2 [IU] via SUBCUTANEOUS
  Administered 2019-05-31: 3 [IU] via SUBCUTANEOUS
  Administered 2019-06-01: 5 [IU] via SUBCUTANEOUS
  Administered 2019-06-01: 2 [IU] via SUBCUTANEOUS
  Administered 2019-06-03 (×2): 3 [IU] via SUBCUTANEOUS
  Administered 2019-06-04 – 2019-06-06 (×3): 2 [IU] via SUBCUTANEOUS
  Administered 2019-06-07: 3 [IU] via SUBCUTANEOUS

## 2019-05-27 MED ORDER — SODIUM CHLORIDE 0.9 % IV BOLUS
500.0000 mL | Freq: Once | INTRAVENOUS | Status: AC
  Administered 2019-05-27: 500 mL via INTRAVENOUS

## 2019-05-27 MED ORDER — FAMOTIDINE 20 MG PO TABS
20.0000 mg | ORAL_TABLET | Freq: Two times a day (BID) | ORAL | Status: DC | PRN
  Administered 2019-06-01: 20 mg via ORAL
  Filled 2019-05-27: qty 1

## 2019-05-27 MED ORDER — PIPERACILLIN-TAZOBACTAM 3.375 G IVPB 30 MIN
3.3750 g | Freq: Once | INTRAVENOUS | Status: DC
  Filled 2019-05-27: qty 50

## 2019-05-27 MED ORDER — ISOSORBIDE MONONITRATE ER 60 MG PO TB24: 120.0000 mg | ORAL_TABLET | Freq: Every day | ORAL | Status: DC

## 2019-05-27 MED ORDER — VANCOMYCIN HCL 10 G IV SOLR
1500.0000 mg | Freq: Once | INTRAVENOUS | Status: AC
  Administered 2019-05-28: 1500 mg via INTRAVENOUS
  Filled 2019-05-27 (×2): qty 1500

## 2019-05-27 MED ORDER — LEVOTHYROXINE SODIUM 50 MCG PO TABS
50.0000 ug | ORAL_TABLET | Freq: Every day | ORAL | Status: DC
  Administered 2019-05-28 – 2019-06-07 (×11): 50 ug via ORAL
  Filled 2019-05-27 (×12): qty 1

## 2019-05-27 MED ORDER — ASPIRIN 81 MG PO TABS: 81.0000 mg | ORAL_TABLET | Freq: Every day | ORAL | Status: DC

## 2019-05-27 NOTE — ED Triage Notes (Signed)
Pt reports that he is a diabetic and has been out of his Metformin X1 month. States that yesterday his right foot became red a swollen and his big toe turned black. Pt in triage changing at this time, will assess once sock removed.

## 2019-05-27 NOTE — H&P (Signed)
History and Physical    Jason [name]  [id number]  DOB: 07-26-1950  DOA: 05/27/2019 PCP: Patient, [name]  He last was seen at [facility]  Patient coming from: home  Chief Complaint: pain and redness in right foot  HPI: Jason [name] is a 68 y.o. male coming from [place] center with medical history of DM, not on medications for 1 month, b/l Carotid artery disease with R ICA stent, occluded L ICA stent,CVA, COPD, CAD s/p CABG in 2011, and and NSTEMI and DES to sVF- OM2 in 2018, HTN, GERD, nicotine abuse who presents with about 4 days of right foot pain followed by a change in color of his 1st toe to black. He admits to fever, chills, nausea and vomiting. He states he thinks he noted fevers/ chill about 1 wk ago. The foot then became red and painful and the toe then became purple and black. His pain is moderate and worse when walking, radiates up his leg. He has had nausea and occasional vomiting for 2 days. He has not been able to pick up his medications in at least 1 month as he lives in [place] and his pharmacy is in [place].    ED Course:  Sodium 125, CO2 18, WBC 13.6 Given Zosyn  R foot Xray> no osteomyelitis, no gas  Review of Systems:  Fatigue for a few months Occasional heaviness in his chest All other systems reviewed and apart from HPI, are negative.  Past Medical History:  Diagnosis Date  . Anxiety    off xanax  and paxil since 3/13  . Arthritis   . Bilateral carotid artery disease ([place])    s/p R ICA stent 03/28/2015 with distal protection. Known chronically occluded L ICA. Carotid stent complicated by hypotension and acute stroke in [place]  . Cancer ([place])    tumor basal cell rem from lft arm  . Cataract   . COPD (chronic obstructive pulmonary disease) ([place])   . Coronary artery disease    a. s/p CABG in 2011 with LIMA-LAD, SVG-RI/OM, and SVG-PDA b. occluded SVG-PDA by cath in 2015 c. 10/2016: NSTEMI with cath showing  thrombus along the distal graft to insertion of SVG-OM2 with DES placed.   . CVA (cerebral infarction)    occured on 10/10 several days after R ICA carotid stenting  . Depression   . Diabetes mellitus   . GERD (gastroesophageal reflux disease)   . Heart attack ([place])   . High cholesterol   . Hypertension    type 2 NIDDM  . Myocardial infarct (H[facility])    x4 last 10 yrs  . Pneumonia    hx  . 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 Smoker   . Substance abuse [facility])     Past Surgical History:  Procedure Laterality Date  . BACK SURGERY  Jan 2014, Dec 2011   Dr Vertell Limber  . CARDIAC CATHETERIZATION  4/12   Medical Rx  . CORONARY ANGIOGRAM  01/17/14   med rx  . CORONARY ANGIOGRAM  4/13   med Rx  . CORONARY ANGIOGRAM  1/15   Med Rx  . CORONARY ANGIOPLASTY  Jan 2004   RCA  . CORONARY ARTERY BYPASS GRAFT  07/24/2009   L-LAD, SVG-RI/OM, SVG-PDA  . CORONARY STENT INTERVENTION N/A 10/23/2016   Procedure: Coronary Stent Intervention;  Surgeon: Peter M Martinique, MD;  Location: [place] CV LAB;  Service: Cardiovascular;  Laterality: N/A;  .  EYE SURGERY     cat bil  . FEMORAL ARTERY - FEMORAL ARTERY BYPASS GRAFT Right 2004   femoral enarterectomy  . LEFT HEART CATH AND CORS/GRAFTS ANGIOGRAPHY N/A 10/23/2016   Procedure: Left Heart Cath and Cors/Grafts Angiography;  Surgeon: Peter M Martinique, MD;  Location: [place] [facility];  Service: Cardiovascular;  Laterality: N/A;  . LEFT HEART CATHETERIZATION WITH CORONARY ANGIOGRAM N/A 10/03/2011   Procedure: LEFT HEART CATHETERIZATION WITH CORONARY ANGIOGRAM;  Surgeon: Pixie Casino, MD;  Location[facility] C[facility] LAB;  Service: Cardiovascular;  Laterality: N/A;  . LEFT HEART CATHETERIZATION WITH CORONARY ANGIOGRAM N/A 07/08/2013   Procedure: LEFT HEART CATHETERIZATION WITH CORONARY ANGIOGRAM;  Surgeon: Blane Ohara, MD;  Location: [facility] CATH LAB;  Service: Cardiovascular;  Laterality: N/A;  . LEFT HEART CATHETERIZATION WITH  CORONARY/GRAFT ANGIOGRAM N/A 01/17/2014   Procedure: LEFT HEART CATHETERIZATION WITH Beatrix Fetters;  Surgeon: Troy Sine, MD;  Location: [facility] CATH LAB;  Service: Cardiovascular;  Laterality: N/A;  . PERIPHERAL VASCULAR CATHETERIZATION N/A 03/28/2015   Procedure: Carotid PTA/Stent Intervention;  Surgeon: Lorretta Harp, MD;  Location: [place] CV LAB;  Service: Cardiovascular;  Laterality: N/A;  . US EXTREMITY*L*     lft arm tumor removed     Social History:   reports that he has been smoking cigarettes.He is smoking 1 PPD now. He has never used smokeless tobacco. He reports ocassional alcohol use. He reports that he does not use drugs.  Allergies  Allergen Reactions  . Coreg [Carvedilol] Nausea And Vomiting and Other (See Comments)    Per patient made him dizzy and light sensitive  . Sulfa Antibiotics Nausea And Vomiting and Other (See Comments)    Also headaches     Family History  Problem Relation Age of Onset  . Arthritis Mother   . Heart disease Father      Prior to Admission medications   Medication Sig Start Date End Date Taking? Authorizing Provider  acetaminophen (TYLENOL) 325 MG tablet Take 2 tablets (650 mg total) by mouth every 4 (four) hours as needed for headache or mild pain. 01/18/14   Erlene Quan, PA-C  albuterol (PROVENTIL) (2.5 MG/3ML) 0.083% nebulizer solution USE 1 VIAL IN NEBULIZER EVERY 6 HOURS AS NEEDED 04/05/19   Wendie Agreste, MD  aspirin 81 MG tablet Take 1 tablet (81 mg total) by mouth daily. 10/26/16   Consuelo Pandy, PA-C  clopidogrel (PLAVIX) 75 MG tablet Take 1 tablet (75 mg total) by mouth daily. 03/02/19   Kroeger, Lorelee Cover., PA-C  isosorbide mononitrate (IMDUR) 120 MG 24 hr tablet Take 1 tablet (120 mg total) by mouth daily. 03/02/19   Kroeger, Lorelee Cover., PA-C  levothyroxine (SYNTHROID, LEVOTHROID) 50 MCG tablet TAKE 1 TABLET BY MOUTH EVERY DAY 08/31/16   Tereasa Coop, PA-C  loratadine (CLARITIN) 10 MG tablet Take 10 mg by  mouth daily.    [provider]  metFORMIN (GLUCOPHAGE) 1000 MG tablet TAKE 1 TABLET BY MOUTH TWICE A DAY WITH A MEAL 12/30/17   McVey, Gelene Mink, PA-C  metoprolol succinate (TOPROL-XL) 100 MG 24 hr tablet TAKE 1 TABLET BY MOUTH DAILY. CALL OUR OFFICE FOR AN APPOINTMENT 03/02/19   Kroeger, Daleen Snook M., PA-C  nitroGLYCERIN (NITROSTAT) 0.4 MG SL tablet PLACE 1 TABLET (0.4 MG TOTAL) UNDER THE TONGUE EVERY 5 (FIVE) MINUTES AS NEEDED FOR CHEST PAIN. 03/02/19   Kroeger, Lorelee Cover., PA-C  ranitidine (ZANTAC) 150 MG tablet Take 150 mg by mouth daily as needed for  heartburn. For acid reflux    [provider]  ranolazine (RANEXA) 1000 MG SR tablet TAKE 1 TABLET BY MOUTH 2 TIMES DAILY. NEED OV. 03/02/19   Kroeger, Daleen Snook M., PA-C  rosuvastatin (CRESTOR) 10 MG tablet Take 1 tablet (10 mg total) by mouth daily. 03/02/19 05/31/19  Kroeger, Lorelee Cover., PA-C  sertraline (ZOLOFT) 100 MG tablet Take 2 and 1/2 tablets by mouth daily 12/22/18   Forrest Moron, MD    Physical Exam: Wt Readings from Last 3 Encounters:  05/27/19 79.4 kg  03/02/19 77.2 kg  05/06/18 74.1 kg   Vitals:   05/27/19 1352 05/27/19 1430 05/27/19 1530 05/27/19 1702  BP: 132/73 134/70 118/60 (!) 146/69  Pulse: 79 82 93 81  Resp: (!) 26 (!) 27 (!) 25 16  Temp:    98.4 F (36.9 C)  TempSrc:    Oral  SpO2: 100% 100% 99% 99%  Weight:      Height:          Constitutional:  Calm & comfortable- smells strongly of cigarettes  Eyes: PERRLA, lids and conjunctivae normal ENT:  Mucous membranes are moist.  Pharynx clear of exudate   Normal dentition.  Neck: Supple, no masses  Respiratory:  Clear to auscultation bilaterally  Normal respiratory effort.  Cardiovascular:  S1 & S2 heard, regular rate and rhythm No Murmurs Abdomen:  Non distended No tenderness, No masses Bowel sounds normal Extremities:  No clubbing / cyanosis No pedal edema No joint deformity    Skin:  Erythema of right foot with tenderness - streaks  of erythema going up the leg up to the knee- right first toe is purple- dorsalis pedis pulse is faint Neurologic:  AAO x 3 CN 2-12 grossly intact Sensation intact Strength 5/5 in all 4 extremities Psychiatric:  Normal Mood and affect    Labs on Admission: I have personally reviewed following labs and imaging studies  CBC: Recent Labs  Lab 05/27/19 1059  WBC 13.6*  NEUTROABS 10.2*  HGB 13.2  HCT 40.0  MCV 96.9  PLT [id number]   Basic Metabolic Panel: Recent Labs  Lab 05/27/19 1059  NA 125*  K 4.2  CL 95*  CO2 18*  GLUCOSE 265*  BUN 10  CREATININE 0.88  CALCIUM 9.4   GFR: Estimated Creatinine Clearance: 82.6 mL/min (by C-G formula based on SCr of 0.88 mg/dL). Liver Function Tests: No results for input(s): AST, ALT, ALKPHOS, BILITOT, PROT, ALBUMIN in the last 168 hours. No results for input(s): LIPASE, AMYLASE in the last 168 hours. No results for input(s): AMMONIA in the last 168 hours. Coagulation Profile: No results for input(s): INR, PROTIME in the last 168 hours. Cardiac Enzymes: No results for input(s): CKTOTAL, CKMB, CKMBINDEX, TROPONINI in the last 168 hours. BNP (last 3 results) No results for input(s): PROBNP in the last 8760 hours. HbA1C: No results for input(s): HGBA1C in the last 72 hours. CBG: No results for input(s): GLUCAP in the last 168 hours. Lipid Profile: No results for input(s): CHOL, HDL, LDLCALC, TRIG, CHOLHDL, LDLDIRECT in the last 72 hours. Thyroid Function Tests: No results for input(s): TSH, T4TOTAL, FREET4, T3FREE, THYROIDAB in the last 72 hours. Anemia Panel: No results for input(s): VITAMINB12, FOLATE, FERRITIN, TIBC, IRON, RETICCTPCT in the last 72 hours. Urine analysis:    Component Value Date/Time   COLORURINE YELLOW 07/08/2013 0226   APPEARANCEUR CLEAR 07/08/2013 0226   LABSPEC 1.006 07/08/2013 0226   PHURINE 6.0 07/08/2013 0226   GLUCOSEU NEGATIVE 07/08/2013 0226  HGBUR NEGATIVE 07/08/2013 0226   BILIRUBINUR NEGATIVE  07/08/2013 0226   KETONESUR NEGATIVE 07/08/2013 0226   PROTEINUR NEGATIVE 07/08/2013 0226   UROBILINOGEN 0.2 07/08/2013 0226   NITRITE NEGATIVE 07/08/2013 0226   LEUKOCYTESUR NEGATIVE 07/08/2013 0226   Sepsis Labs: @LABRCNTIP (procalcitonin:4,lacticidven:4) ) Recent Results (from the past 240 hour(s))  Culture, blood (routine x 2)     Status: None (Preliminary result)   Collection Time: 05/27/19 10:45 AM   Specimen: BLOOD  Result Value Ref Range Status   Specimen Description BLOOD LEFT ANTECUBITAL  Final   Special Requests   Final    BOTTLES DRAWN AEROBIC ONLY Blood Culture results may not be optimal due to an inadequate volume of blood received in culture bottles Performed at [facility] [address] [place], [place] [address]    Culture PENDING  Incomplete   Report Status PENDING  Incomplete  SARS CORONAVIRUS 2 (TAT 6-24 HRS) Nasopharyngeal Nasopharyngeal Swab     Status: None   Collection Time: 05/27/19 11:08 AM   Specimen: Nasopharyngeal Swab  Result Value Ref Range Status   SARS Coronavirus 2 NEGATIVE NEGATIVE Final    Comment: (NOTE) SARS-CoV-2 target nucleic acids are NOT DETECTED. The SARS-CoV-2 RNA is generally detectable in upper and lower respiratory specimens during the acute phase of infection. Negative results do not preclude SARS-CoV-2 infection, do not rule out co-infections with other pathogens, and should not be used as the sole basis for treatment or other patient management decisions. Negative results must be combined with clinical observations, patient history, and epidemiological information. The expected result is Negative. Fact Sheet for Patients: SugarRoll.be Fact Sheet for Healthcare Providers: https://www.woods-mathews.com/ This test is not yet approved or cleared by the [place] FDA and  has been authorized for detection and/or diagnosis of SARS-CoV-2 by FDA under an Emergency Use  Authorization (EUA). This EUA will remain  in effect (meaning this test can be used) for the duration of the COVID-19 declaration under Section 56 4(b)(1) of the Act, 21 U.S.C. section 360bbb-3(b)(1), unless the authorization is terminated or revoked sooner. Performed at [facility], [place] [address], [place], [place] [address]      Radiological Exams on Admission: Dg Chest 1 View  Result Date: 05/27/2019 CLINICAL DATA:  Cellulitis. EXAM: CHEST  1 VIEW COMPARISON:  August 01, 2017. FINDINGS: The heart size and mediastinal contours are within normal limits. Both lungs are clear. Status post coronary bypass graft. The visualized skeletal structures are unremarkable. IMPRESSION: No active disease. Electronically Signed   By: Marijo Conception M.D.   On: 05/27/2019 11:34   Dg Foot Complete Right  Result Date: 05/27/2019 CLINICAL DATA:  Cellulitis.  Right foot swelling. EXAM: RIGHT FOOT COMPLETE - 3+ VIEW COMPARISON:  None. FINDINGS: There is no evidence of fracture or dislocation. There is no evidence of arthropathy or other focal bone abnormality. No lytic destruction is seen to suggest osteomyelitis. Soft tissues are unremarkable. IMPRESSION: Negative. Electronically Signed   By: Marijo Conception M.D.   On: 05/27/2019 11:36    EKG: Independently reviewed. Ventricular trigeminy, RBBB, Prolonged QTc> 5104msec  Assessment/Plan Principal Problem:  Sepsis with Cellulitis, leukocytosis and mild tachypnea/   Gangrene of right first toe toe     H/o PVD,  Rt femoral endarterectomy 2004 -Lactic acid is normal -It seems that his infection may have started about a week ago -In regards to his PVD, the patient has run out of his Plavix but he has been taking  a full dose aspirin- cont ASA for now- I have consulted Vascular surgery, Dr Scot Dock  - I have started Zosyn and Vancomycin - blood cultures were sent in the ED    Active Problems:   DM (diabetes mellitus), uncontrolled with hyperglycemia  -He states that he ran out of his Glucophage over a month ago he states he was on insulin in the past but has not required it for many years -We will check a hemoglobin A1c and order an insulin sliding scale along with a diabetic diet. -He is slightly acidotic -last blood sugar that was checked today was not significantly elevated and I am not convinced that he is in DKA but will obtain a beta hydroxybutyric acid  History of coronary artery disease status post CABG and stent -Resume Plavix aspirin, metoprolol, statin  Trigeminy, prolonged QTC -His QTC is significantly prolonged and he is having trigeminy -He was admitted to a MedSurg bed however, due to EKG changes and extensive cardiac history I will add on telemetry - this can be discontinued once his infection is better controlled     Hyponatremia, nausea and vomiting for 2 days -Start normal saline and continue to follow -At this time he feels like he has an appetite, does not have nausea and therefore would like to try solid food-  - hold off on Zofran due to severely prolonged QTc    Hypothyroidism -  he states that he has been out of his Synthroid as well and therefore I will obtain a TSH and free T4 -I will resume the dose of Synthroid he was previously taking is is 50 mcg according to his med rec  Depression -He states that he is taking Zoloft but ran out of this on Tuesday-I will resume this    HTN (hypertension) -Metoprolol has been resumed  Nicotine abuse -He continues to smoke 1 pack/day -I have counseled him to quit and will be ordering the nicotine patch  NOTE: Med rec has not been updated, I have resumed what I can from a prior med rec after discussing the medications with the patient myself  DVT prophylaxis: Lovenox  Code Status: Do not Intubate  Family Communication: none  Consults called: Vascular surgery, Dr Scot Dock  Admission status: Inpatient    Debbe Odea MD [facility] Pager: [email or web address]  Password [email or web address] 7PM-7AM, please contact night-coverage   05/27/2019, 6:04 PM

## 2019-05-27 NOTE — Progress Notes (Addendum)
Pharmacy Antibiotic Note  Jason [name] is a 68 y.o. male admitted on 05/27/2019 with cellulitis.  Pharmacy has been consulted for vancomycin and Zosyn dosing.  Plan:  Vancomycin 1500 mg IV now, then 1750 mg IV q24 hr (est AUC 486 based on SCr 1.02)  Measure vancomycin AUC at steady state as indicated  Zosyn 3.375 g IV given once over 30 minutes, then every 8 hrs by 4-hr infusion  SCr daily while on vanc + Zosyn   Height: 5\' 9"  (175.3 cm) Weight: 175 lb (79.4 kg) IBW/kg (Calculated) : 70.7  Temp (24hrs), Avg:98.4 F (36.9 C), Min:98.4 F (36.9 C), Max:98.4 F (36.9 C)  Recent Labs  Lab 05/27/19 1059 05/27/19 1837  WBC 13.6* 11.6*  CREATININE 0.88 1.02  LATICACIDVEN 1.8  --     Estimated Creatinine Clearance: 71.2 mL/min (by C-G formula based on SCr of 1.02 mg/dL).    Allergies  Allergen Reactions  . Coreg [Carvedilol] Nausea And Vomiting and Other (See Comments)    Per patient made him dizzy and light sensitive  . Sulfa Antibiotics Nausea And Vomiting and Other (See Comments)    Also headaches     Antimicrobials this admission: 12/4 vanc >>  12/4 Zosyn >>   Dose adjustments this admission: n/a  Microbiology results: 12/4 BCx: sent  Thank you for allowing pharmacy to be a part of this patient's care.  WOFFORD, DREW A 05/27/2019 7:49 PM   Modified to change to q24hr from q12hr---error in vancomycin dosing in note, correct in EPIC

## 2019-05-27 NOTE — ED Provider Notes (Addendum)
[facility] EMERGENCY DEPARTMENT Provider Note   CSN: [id number] Arrival date & time: 05/27/19  1005     History   Chief Complaint Chief Complaint  Patient presents with   Foot Pain    HPI Jason [name] is a 68 y.o. male.  He is complaining of right great toe and foot pain that started 4 days ago.  He noticed that the great toe was black starting yesterday.  Increased swelling.  He is also had fevers and chills.  Some nausea and vomiting.  He has had worsening fatigue but he says has been going on for months.  He has been out of his Metformin for at least a month and possibly some of his other medications.  Denies any trauma to the foot.     The history is provided by the patient.  Foot Pain This is a new problem. The current episode started more than 2 days ago. The problem occurs constantly. The problem has been gradually worsening. Pertinent negatives include no chest pain, no abdominal pain, no headaches and no shortness of breath. The symptoms are aggravated by walking. Nothing relieves the symptoms. He has tried nothing for the symptoms. The treatment provided no relief.    Past Medical History:  Diagnosis Date   Anxiety    off xanax  and paxil since 3/13   Arthritis    Bilateral carotid artery disease ([place])    s/p R ICA stent 03/28/2015 with distal protection. Known chronically occluded L ICA. Carotid stent complicated by hypotension and acute stroke in [place]   Cancer [facility])    tumor basal cell rem from lft arm   Cataract    COPD (chronic obstructive pulmonary disease) ([place])    Coronary artery disease    a. s/p CABG in 2011 with LIMA-LAD, SVG-RI/OM, and SVG-PDA b. occluded SVG-PDA by cath in 2015 c. 10/2016: NSTEMI with cath showing thrombus along the distal graft to insertion of SVG-OM2 with DES placed.    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) 01/16/2014   Tobacco abuse 01/16/2014   Substance abuse in remission ([place]) 10/01/2012   Radiculitis 02/10/2012   CAD, CABG Feb 2011, cath x 4 since-medical Rx 10/03/2011   PVD, hx Rt femoral endarterectomy 2004 10/03/2011   DM (diabetes mellitus) ([place]) 10/02/2011   HTN (hypertension) 10/02/2011   Hyperlipidemia 10/02/2011    Past Surgical History:  Procedure Laterality Date   BACK SURGERY  Jan 2014, Dec  2011   Dr Vertell Limber   CARDIAC CATHETERIZATION  4/12   Medical Rx   CORONARY ANGIOGRAM  01/17/14   med rx   CORONARY ANGIOGRAM  4/13   med Rx   CORONARY ANGIOGRAM  1/15   Med Rx   CORONARY ANGIOPLASTY  Jan 2004   RCA   CORONARY ARTERY BYPASS GRAFT  07/24/2009   L-LAD, SVG-RI/OM, SVG-PDA   CORONARY STENT INTERVENTION N/A 10/23/2016   Procedure: Coronary Stent Intervention;  Surgeon: Peter M Martinique, MD;  Location: [facility] CV LAB;  Service: Cardiovascular;  Laterality: N/A;   EYE SURGERY     cat bil   FEMORAL ARTERY - FEMORAL ARTERY BYPASS GRAFT Right 2004   femoral enarterectomy   LEFT HEART CATH AND CORS/GRAFTS ANGIOGRAPHY N/A  10/23/2016   Procedure: Left Heart Cath and Cors/Grafts Angiography;  Surgeon: Peter M Martinique, MD;  Location: [facility] CV LAB;  Service: Cardiovascular;  Laterality: N/A;   LEFT HEART CATHETERIZATION WITH CORONARY ANGIOGRAM N/A 10/03/2011   Procedure: LEFT HEART CATHETERIZATION WITH CORONARY ANGIOGRAM;  Surgeon: Pixie Casino, MD;  Location: [facility] C[facility] LAB;  Service: Cardiovascular;  Laterality: N/A;   LEFT HEART CATHETERIZATION WITH CORONARY ANGIOGRAM N/A 07/08/2013   Procedure: LEFT HEART CATHETERIZATION WITH CORONARY ANGIOGRAM;  Surgeon: Blane Ohara, MD;  Location: [facility] CATH LAB;  Service: Cardiovascular;  Laterality: N/A;   LEFT HEART CATHETERIZATION WITH CORONARY/GRAFT ANGIOGRAM N/A 01/17/2014   Procedure: LEFT HEART CATHETERIZATION WITH Beatrix Fetters;  Surgeon: Troy Sine, MD;  Location: [facility] CATH LAB;  Service: Cardiovascular;  Laterality: N/A;   PERIPHERAL VASCULAR CATHETERIZATION N/A 03/28/2015   Procedure: Carotid PTA/Stent Intervention;  Surgeon: Lorretta Harp, MD;  Location: [facility] CV LAB;  Service: Cardiovascular;  Laterality: N/A;   US EXTREMITY*L*     lft arm tumor removed         Home Medications    Prior to Admission medications   Medication Sig Start Date End Date Taking? Authorizing Provider  acetaminophen (TYLENOL) 325 MG tablet Take 2 tablets (650 mg total) by mouth every 4 (four) hours as needed for headache or mild pain. 01/18/14   Erlene Quan, PA-C  albuterol (PROVENTIL) (2.5 MG/3ML) 0.083% nebulizer solution USE 1 VIAL IN NEBULIZER EVERY 6 HOURS AS NEEDED 04/05/19   Wendie Agreste, MD  aspirin 81 MG tablet Take 1 tablet (81 mg total) by mouth daily. 10/26/16   Consuelo Pandy, PA-C  clopidogrel (PLAVIX) 75 MG tablet Take 1 tablet (75 mg total) by mouth daily. 03/02/19   Kroeger, Lorelee Cover., PA-C  isosorbide mononitrate (IMDUR) 120 MG 24 hr tablet Take 1 tablet (120 mg total) by mouth daily. 03/02/19   Kroeger, Lorelee Cover., PA-C   levothyroxine (SYNTHROID, LEVOTHROID) 50 MCG tablet TAKE 1 TABLET BY MOUTH EVERY DAY 08/31/16   Tereasa Coop, PA-C  loratadine (CLARITIN) 10 MG tablet Take 10 mg by mouth daily.    [provider]  metFORMIN (GLUCOPHAGE) 1000 MG tablet TAKE 1 TABLET BY MOUTH TWICE A DAY WITH A MEAL 12/30/17   McVey, Gelene Mink, PA-C  metoprolol succinate (TOPROL-XL) 100 MG 24 hr tablet TAKE 1 TABLET BY MOUTH DAILY. CALL OUR OFFICE FOR AN APPOINTMENT 03/02/19   Kroeger, Daleen Snook M., PA-C  nitroGLYCERIN (NITROSTAT) 0.4 MG SL tablet PLACE 1 TABLET (0.4 MG TOTAL) UNDER THE TONGUE EVERY 5 (FIVE) MINUTES AS NEEDED FOR CHEST PAIN. 03/02/19   Kroeger, Lorelee Cover., PA-C  ranitidine (ZANTAC) 150 MG tablet Take 150 mg by  mouth daily as needed for heartburn. For acid reflux    [provider]  ranolazine (RANEXA) 1000 MG SR tablet TAKE 1 TABLET BY MOUTH 2 TIMES DAILY. NEED OV. 03/02/19   Kroeger, Daleen Snook M., PA-C  rosuvastatin (CRESTOR) 10 MG tablet Take 1 tablet (10 mg total) by mouth daily. 03/02/19 05/31/19  Kroeger, Lorelee Cover., PA-C  sertraline (ZOLOFT) 100 MG tablet Take 2 and 1/2 tablets by mouth daily 12/22/18   Forrest Moron, MD    Family History Family History  Problem Relation Age of Onset   Arthritis Mother    Heart disease Father     Social History Social History   Tobacco Use   Smoking status: Current Every Day Smoker    Packs/day: 1.00    Years: 48.00    Pack years: 48.00    Types: Cigarettes   Smokeless tobacco: Never Used  Substance Use Topics   Alcohol use: Yes    Alcohol/week: 0.0 standard drinks    Comment: socially   Drug use: No    Comment: hx of cocaine use     Allergies   Coreg [carvedilol] and Sulfa antibiotics   Review of Systems Review of Systems  Constitutional: Positive for chills, fatigue and fever.  HENT: Negative for sore throat.   Eyes: Negative for visual disturbance.  Respiratory: Negative for shortness of breath.   Cardiovascular: Negative for  chest pain.  Gastrointestinal: Negative for abdominal pain.  Genitourinary: Negative for dysuria.  Musculoskeletal: Positive for gait problem.  Skin: Positive for color change and rash.  Neurological: Negative for headaches.     Physical Exam Updated Vital Signs BP (!) 146/69 (BP Location: Left Arm)    Pulse 81    Temp 98.4 F (36.9 C) (Oral)    Resp 16    Ht 5\' 9"  (1.753 m)    Wt 79.4 kg    SpO2 99%    BMI 25.84 kg/m   Physical Exam Vitals signs and nursing note reviewed.  Constitutional:      Appearance: He is well-developed.  HENT:     Head: Normocephalic and atraumatic.  Eyes:     Conjunctiva/sclera: Conjunctivae normal.  Neck:     Musculoskeletal: Neck supple.  Cardiovascular:     Rate and Rhythm: Normal rate and regular rhythm.     Heart sounds: No murmur.  Pulmonary:     Effort: Pulmonary effort is normal. No respiratory distress.     Breath sounds: Normal breath sounds.  Abdominal:     Palpations: Abdomen is soft.     Tenderness: There is no abdominal tenderness.  Musculoskeletal:        General: Swelling and tenderness present.     Comments: Patient's right foot is swollen with erythema and a streak going up to the mid tibia.  Great toe is discolored and insensate.  No purulent drainage.  PT pulse has Doppler signal although DP not identified.  Skin:    General: Skin is warm and dry.  Neurological:     General: No focal deficit present.     Mental Status: He is alert.        ED Treatments / Results  Labs (all labs ordered are listed, but only abnormal results are displayed) Labs Reviewed  BASIC METABOLIC PANEL - Abnormal; Notable for the following components:      Result Value   Sodium 125 (*)    Chloride 95 (*)    CO2 18 (*)    Glucose, Bld 265 (*)  All other components within normal limits  CBC WITH DIFFERENTIAL/PLATELET - Abnormal; Notable for the following components:   WBC 13.6 (*)    RBC 4.13 (*)    Neutro Abs 10.2 (*)    Abs Immature  Granulocytes 0.18 (*)    All other components within normal limits  CULTURE, BLOOD (ROUTINE X 2)  SARS CORONAVIRUS 2 (TAT 6-24 HRS)  CULTURE, BLOOD (ROUTINE X 2)  LACTIC ACID, PLASMA  HIV ANTIBODY (ROUTINE TESTING W REFLEX)  HEMOGLOBIN A1C  TSH  T4, FREE  BETA-HYDROXYBUTYRIC ACID    EKG None  Radiology Dg Chest 1 View  Result Date: 05/27/2019 CLINICAL DATA:  Cellulitis. EXAM: CHEST  1 VIEW COMPARISON:  August 01, 2017. FINDINGS: The heart size and mediastinal contours are within normal limits. Both lungs are clear. Status post coronary bypass graft. The visualized skeletal structures are unremarkable. IMPRESSION: No active disease. Electronically Signed   By: Marijo Conception M.D.   On: 05/27/2019 11:34   Dg Foot Complete Right  Result Date: 05/27/2019 CLINICAL DATA:  Cellulitis.  Right foot swelling. EXAM: RIGHT FOOT COMPLETE - 3+ VIEW COMPARISON:  None. FINDINGS: There is no evidence of fracture or dislocation. There is no evidence of arthropathy or other focal bone abnormality. No lytic destruction is seen to suggest osteomyelitis. Soft tissues are unremarkable. IMPRESSION: Negative. Electronically Signed   By: Marijo Conception M.D.   On: 05/27/2019 11:36    Procedures .Critical Care Performed by: Hayden Rasmussen, MD Authorized by: Hayden Rasmussen, MD   Critical care provider statement:    Critical care time (minutes):  45   Critical care time was exclusive of:  Separately billable procedures and treating other patients   Critical care was necessary to treat or prevent imminent or life-threatening deterioration of the following conditions:  Sepsis   Critical care was time spent personally by me on the following activities:  Discussions with consultants, evaluation of patient's response to treatment, examination of patient, ordering and performing treatments and interventions, ordering and review of laboratory studies, ordering and review of radiographic studies, pulse  oximetry, re-evaluation of patient's condition, obtaining history from patient or surrogate, review of old charts and development of treatment plan with patient or surrogate   I assumed direction of critical care for this patient from another provider in my specialty: no     (including critical care time)  Medications Ordered in ED Medications  0.9 %  sodium chloride infusion ( Intravenous Stopped 05/27/19 1155)  oxyCODONE-acetaminophen (PERCOCET/ROXICET) 5-325 MG per tablet 1-2 tablet (2 tablets Oral Given 05/27/19 1402)  enoxaparin (LOVENOX) injection 40 mg (has no administration in time range)  piperacillin-tazobactam (ZOSYN) IVPB 3.375 g (has no administration in time range)  levothyroxine (SYNTHROID) tablet 50 mcg (has no administration in time range)  clopidogrel (PLAVIX) tablet 75 mg (has no administration in time range)  aspirin tablet 81 mg (has no administration in time range)  metoprolol succinate (TOPROL-XL) 24 hr tablet 100 mg (has no administration in time range)  isosorbide mononitrate (IMDUR) 24 hr tablet 120 mg (has no administration in time range)  rosuvastatin (CRESTOR) tablet 10 mg (has no administration in time range)  albuterol (PROVENTIL) (2.5 MG/3ML) 0.083% nebulizer solution 2.5 mg (has no administration in time range)  famotidine (PEPCID) tablet 20 mg (has no administration in time range)  piperacillin-tazobactam (ZOSYN) IVPB 3.375 g (has no administration in time range)  vancomycin (VANCOCIN) 1,500 mg in sodium chloride 0.9 % 500 mL IVPB (has no  administration in time range)  sertraline (ZOLOFT) tablet 250 mg (has no administration in time range)  ondansetron (ZOFRAN) injection 4 mg (has no administration in time range)  nicotine (NICODERM CQ - dosed in mg/24 hours) patch 21 mg (has no administration in time range)  piperacillin-tazobactam (ZOSYN) IVPB 3.375 g (0 g Intravenous Stopped 05/27/19 1155)  sodium chloride 0.9 % bolus 500 mL (0 mLs Intravenous Stopped  05/27/19 1355)     Initial Impression / Assessment and Plan / ED Course  I have reviewed the triage vital signs and the nursing notes.  Pertinent labs & imaging results that were available during my care of the patient were reviewed by me and considered in my medical decision making (see chart for details).  Clinical Course as of May 26 1808  Fri May 27, 2019  1115 EKG showing sinus rhythm with a rate of 95, frequent PVCs, right bundle branch block and left posterior fascicular block.  Similar pattern but more ectopy than last EKG 5/18.   [MB]  1122 Differential diagnosis includes diabetic foot infection, peripheral vascular disease, osteomyelitis.  Getting x-ray of the foot along with labs and giving IV antibiotics.  Will need admission for continued management of this.   [MB]  [id number] Patient's lab work shows a mildly elevated white count of 11.3.  His glucose is elevated to 65 and his sodium is low at 125.  Bicarb also down.  Lactate normal.  Blood cultures and Covid testing pending.  X-rays interpreted by me as no gross evidence of osteomyelitis.  Chest x-ray interpreted by me as no acute disease.  Zosyn infusing.  Have paged the hospitalist regarding admission.   [MB]  1204 Discussed with Dr. Zigmund Daniel [facility] hospitalist accepts patient for admission.   [MB]    Clinical Course User Index [MB] Hayden Rasmussen, MD        Final Clinical Impressions(s) / ED Diagnoses   Final diagnoses:  Diabetic foot infection ([place])  Gangrene of toe ([place])  Hyponatremia  Hyperglycemia    ED Discharge Orders    None       Hayden Rasmussen, MD 05/27/19 1814    Hayden Rasmussen, MD 05/29/19 [phone]

## 2019-05-28 DIAGNOSIS — I96 Gangrene, not elsewhere classified: Secondary | ICD-10-CM

## 2019-05-28 DIAGNOSIS — I739 Peripheral vascular disease, unspecified: Secondary | ICD-10-CM

## 2019-05-28 DIAGNOSIS — L03115 Cellulitis of right lower limb: Secondary | ICD-10-CM

## 2019-05-28 DIAGNOSIS — E0800 Diabetes mellitus due to underlying condition with hyperosmolarity without nonketotic hyperglycemic-hyperosmolar coma (NKHHC): Secondary | ICD-10-CM

## 2019-05-28 DIAGNOSIS — L03031 Cellulitis of right toe: Secondary | ICD-10-CM

## 2019-05-28 LAB — BASIC METABOLIC PANEL
Anion gap: 10 (ref 5–15)
BUN: 14 mg/dL (ref 8–23)
CO2: 19 mmol/L — ABNORMAL LOW (ref 22–32)
Calcium: 8.7 mg/dL — ABNORMAL LOW (ref 8.9–10.3)
Chloride: 100 mmol/L (ref 98–111)
Creatinine, Ser: 1.02 mg/dL (ref 0.61–1.24)
GFR calc Af Amer: >60 mL/min (ref >60)
GFR calc non Af Amer: >60 mL/min (ref >60)
Glucose, Bld: 205 mg/dL — ABNORMAL HIGH (ref 70–99)
Potassium: 4.1 mmol/L (ref 3.5–5.1)
Sodium: 129 mmol/L — ABNORMAL LOW (ref 135–145)

## 2019-05-28 LAB — GLUCOSE, CAPILLARY
Glucose-Capillary: 202 mg/dL — ABNORMAL HIGH (ref 70–99)
Glucose-Capillary: 236 mg/dL — ABNORMAL HIGH (ref 70–99)
Glucose-Capillary: 181 mg/dL — ABNORMAL HIGH (ref 70–99)
Glucose-Capillary: 167 mg/dL — ABNORMAL HIGH (ref 70–99)

## 2019-05-28 LAB — CBC
HCT: 36.5 % — ABNORMAL LOW (ref 39.0–52.0)
Hemoglobin: 11.9 g/dL — ABNORMAL LOW (ref 13.0–17.0)
MCH: 31.9 pg (ref 26.0–34.0)
MCHC: 32.6 g/dL (ref 30.0–36.0)
MCV: 97.9 fL (ref 80.0–100.0)
Platelets: 258 10*3/uL (ref 150–400)
RBC: 3.73 MIL/uL — ABNORMAL LOW (ref 4.22–5.81)
RDW: 12.4 % (ref 11.5–15.5)
WBC: 11 10*3/uL — ABNORMAL HIGH (ref 4.0–10.5)
nRBC: 0 % (ref 0.0–0.2)

## 2019-05-28 MED ORDER — VANCOMYCIN HCL 10 G IV SOLR
1750.0000 mg | INTRAVENOUS | Status: DC
  Administered 2019-05-28: 1750 mg via INTRAVENOUS
  Filled 2019-05-28 (×2): qty 1750

## 2019-05-28 MED ORDER — INSULIN GLARGINE 100 UNIT/ML ~~LOC~~ SOLN
10.0000 [IU] | Freq: Every day | SUBCUTANEOUS | Status: DC
  Administered 2019-05-28 – 2019-05-29 (×2): 10 [IU] via SUBCUTANEOUS
  Filled 2019-05-28 (×4): qty 0.1

## 2019-05-28 NOTE — Consult Note (Signed)
REASON FOR CONSULT:    Cellulitis with gangrene of the right foot.  The consult is requested by Dr. Wynelle Cleveland  ASSESSMENT & PLAN:   [place]: This patient has cellulitis of the right foot with early gangrenous changes to the right great toe.  He has evidence of multilevel arterial occlusive disease on exam.  I think without revascularization he is at high risk for limb loss.  He will require arteriography.  His vascular disease has previously been followed by Dr. Donnella Bi.  I do not believe that he is on call this weekend so I am not sure if he would want to arrange for his arteriogram next week.  If he is not available to do the arteriogram we could potentially do it on Tuesday.  Probably would make sense to have the patient transferred to [facility].  He is on Zosyn and vancomycin.  I have discussed with him the importance of tobacco cessation.   Deitra Mayo, MD Office: [phone]   HPI:   Jason [name] is a pleasant 68 y.o. male, who was admitted yesterday to [facility] with pain and redness in the right foot.  He is diabetic.  Of note he is not a great historian.  Best I can tell he developed bluish discoloration of his right great toe 5 days ago and then subsequently developed redness in the foot.  He does not remember any specific injury to the foot.  He denies significant claudication although his activity is fairly limited.  He denies any history of rest pain.  His risk factors for peripheral vascular disease include diabetes, hypercholesterolemia, a family history of premature cardiovascular disease, and tobacco abuse.  He smokes 1 pack/day of cigarettes and has been smoking for 50 years.  He does have significant cardiac history and has had 5 previous myocardial infarctions according to the patient.  According to the records in epic the patient had a right internal carotid artery stent in 2016.  He has a known left internal  carotid artery occlusion.  He says that Dr. Leonie Douglas has done his previous procedures.  The patient also underwent coronary revascularization in 2011 by Dr. Dahlia Byes.  He is not sure which leg the vein was taken from  Past Medical History:  Diagnosis Date  . Anxiety    off xanax  and paxil since 3/13  . Arthritis   . Bilateral carotid artery disease ([place])    s/p R ICA stent 03/28/2015 with distal protection. Known chronically occluded L ICA. Carotid stent complicated by hypotension and acute stroke in [place]  . Cancer ([place])    tumor basal cell rem from lft arm  . Cataract   . COPD (chronic obstructive pulmonary disease) ([place])   . Coronary artery disease    a. s/p CABG in 2011 with LIMA-LAD, SVG-RI/OM, and SVG-PDA b. occluded SVG-PDA by cath in 2015 c. 10/2016: NSTEMI with cath showing thrombus along the distal graft to insertion of SVG-OM2 with DES placed.   . CVA (cerebral infarction)    occured on 10/10 several days after R ICA carotid stenting  . Depression   . Diabetes mellitus   . GERD (gastroesophageal reflux disease)   . Heart attack ([place])   . High cholesterol   . Hypertension    type 2 NIDDM  . Myocardial infarct ([facility])    x4 last 10 yrs  . Pneumonia    hx  . 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 Smoker   . Substance abuse ([place])     Family History  Problem Relation Age of Onset  . Arthritis Mother   . Heart disease Father     SOCIAL HISTORY: Social History   Socioeconomic History  . Marital status: Single    Spouse name: Not on file  . Number of children: 1  . Years of education: 41  . Highest education level: Not on file  Occupational History  . Occupation: welder-fabricator  Social Needs  . Financial resource strain: Not on file  . Food insecurity    Worry: Not on file    Inability: Not on file  . Transportation needs    Medical: Not on file    Non-medical: Not on file  Tobacco Use  . Smoking  status: Current Every Day Smoker    Packs/day: 1.00    Years: 48.00    Pack years: 48.00    Types: Cigarettes  . Smokeless tobacco: Never Used  Substance and Sexual Activity  . Alcohol use: Yes    Alcohol/week: 0.0 standard drinks    Comment: socially  . Drug use: No    Comment: hx of cocaine use  . Sexual activity: Not Currently  Lifestyle  . Physical activity    Days per week: Not on file    Minutes per session: Not on file  . Stress: Not on file  Relationships  . Social Herbalist on phone: Not on file    Gets together: Not on file    Attends religious service: Not on file    Active member of club or organization: Not on file    Attends meetings of clubs or organizations: Not on file    Relationship status: Not on file  . Intimate partner violence    Fear of current or ex partner: Not on file    Emotionally abused: Not on file    Physically abused: Not on file    Forced sexual activity: Not on file  Other Topics Concern  . Not on file  Social History Narrative   Lives alone   Caffeine use: Drinks pepsi/coffee (32 oz diet pepsi per day)    Exercise walking 4 times per week for 1 mile    Allergies  Allergen Reactions  . Coreg [Carvedilol] Nausea And Vomiting and Other (See Comments)    Per patient made him dizzy and light sensitive  . Sulfa Antibiotics Nausea And Vomiting and Other (See Comments)    Also headaches     Current Facility-Administered Medications  Medication Dose Route Frequency Provider Last Rate Last Dose  . 0.9 %  sodium chloride infusion   Intravenous PRN Hayden Rasmussen, MD   Stopped at 05/27/19 1155  . acetaminophen (TYLENOL) tablet 650 mg  650 mg Oral Q6H PRN Debbe Odea, MD       Or  . acetaminophen (TYLENOL) suppository 650 mg  650 mg Rectal Q6H PRN Rizwan, Saima, MD      . albuterol (PROVENTIL) (2.5 MG/3ML) 0.083% nebulizer solution 2.5 mg  2.5 mg Nebulization Q4H PRN Debbe Odea, MD      . aspirin EC tablet 81 mg  81 mg  Oral Daily Debbe Odea, MD   81 mg at 05/28/19 0829  . clopidogrel (PLAVIX) tablet 75 mg  75 mg Oral Daily Debbe Odea, MD   75 mg at 05/28/19 0829  . enoxaparin (LOVENOX) injection 40 mg  40 mg Subcutaneous  Q24H Georgette Shell, MD   40 mg at 05/27/19 2035  . famotidine (PEPCID) tablet 20 mg  20 mg Oral BID PRN Debbe Odea, MD      . HYDROcodone-acetaminophen (NORCO/VICODIN) 5-325 MG per tablet 1-2 tablet  1-2 tablet Oral Q4H PRN Debbe Odea, MD      . insulin aspart (novoLOG) injection 0-15 Units  0-15 Units Subcutaneous TID WC Debbe Odea, MD   5 Units at 05/28/19 0757  . insulin aspart (novoLOG) injection 0-5 Units  0-5 Units Subcutaneous QHS Debbe Odea, MD   3 Units at 05/27/19 2312  . isosorbide mononitrate (IMDUR) 24 hr tablet 120 mg  120 mg Oral Daily Debbe Odea, MD   120 mg at 05/28/19 0829  . levothyroxine (SYNTHROID) tablet 50 mcg  50 mcg Oral Daily Debbe Odea, MD   50 mcg at 05/28/19 0527  . metoprolol succinate (TOPROL-XL) 24 hr tablet 100 mg  100 mg Oral Daily Debbe Odea, MD   100 mg at 05/28/19 0830  . nicotine (NICODERM CQ - dosed in mg/24 hours) patch 21 mg  21 mg Transdermal Q24H Debbe Odea, MD   21 mg at 05/27/19 2034  . oxyCODONE-acetaminophen (PERCOCET/ROXICET) 5-325 MG per tablet 1-2 tablet  1-2 tablet Oral Q6H PRN Hayden Rasmussen, MD   2 tablet at 05/28/19 0844  . piperacillin-tazobactam (ZOSYN) IVPB 3.375 g  3.375 g Intravenous Q8H Wofford, Drew A, RPH 12.5 mL/hr at 05/28/19 0555 3.375 g at 05/28/19 0555  . rosuvastatin (CRESTOR) tablet 10 mg  10 mg Oral Daily Debbe Odea, MD   10 mg at 05/28/19 0829  . sertraline (ZOLOFT) tablet 250 mg  250 mg Oral QHS Rizwan, Eunice Blase, MD      . vancomycin (VANCOCIN) 1,750 mg in sodium chloride 0.9 % 500 mL IVPB  1,750 mg Intravenous Q24H Rizwan, Eunice Blase, MD        REVIEW OF SYSTEMS:  [X]  denotes positive finding, [ ]  denotes negative finding Cardiac  Comments:  Chest pain or chest pressure:    Shortness of  breath upon exertion: x   Short of breath when lying flat:    Irregular heart rhythm:        Vascular    Pain in calf, thigh, or hip brought on by ambulation:    Pain in feet at night that wakes you up from your sleep:     Blood clot in your veins:    Leg swelling:         Pulmonary    Oxygen at home:    Productive cough:     Wheezing:         Neurologic    Sudden weakness in arms or legs:     Sudden numbness in arms or legs:     Sudden onset of difficulty speaking or slurred speech:    Temporary loss of vision in one eye:     Problems with dizziness:         Gastrointestinal    Blood in stool:     Vomited blood:         Genitourinary    Burning when urinating:     Blood in urine:        Psychiatric    Major depression:         Hematologic    Bleeding problems:    Problems with blood clotting too easily:        Skin    Rashes or ulcers: x  cellulitis right  foot      Constitutional    Fever or chills:     PHYSICAL EXAM:   Vitals:   05/27/19 1530 05/27/19 1702 05/27/19 1955 05/28/19 0410  BP: 118/60 (!) 146/69 123/64 (!) 119/92  Pulse: 93 81 90 74  Resp: (!) 25 16 17 18   Temp:  98.4 F (36.9 C) 98.7 F (37.1 C) 97.7 F (36.5 C)  TempSrc:  Oral Oral Oral  SpO2: 99% 99% 98% 97%  Weight:      Height:        GENERAL: The patient is a well-nourished male, in no acute distress. The vital signs are documented above. CARDIAC: There is a regular rate and rhythm.  VASCULAR: I do not detect carotid bruits. On the right side there is a clearly diminished right femoral pulse.  I cannot palpate popliteal or pedal pulses.  He does have a monophasic dorsalis pedis, posterior tibial, and peroneal signal with the Doppler. On the left side he has a slightly diminished femoral pulse.  I cannot palpate pedal pulses or popliteal pulse.  He has a monophasic dorsalis pedis and peroneal signal with the Doppler.  I cannot obtain a posterior tibial signal on the left with the  Doppler. PULMONARY: There is good air exchange bilaterally without wheezing or rales. ABDOMEN: Soft and non-tender with normal pitched bowel sounds.  I do not palpate an aneurysm. MUSCULOSKELETAL: There are no major deformities or cyanosis. NEUROLOGIC: No focal weakness or paresthesias are detected. SKIN: He has cellulitis of the right foot with darkish discoloration of the right great toe consistent with early gangrene.      PSYCHIATRIC: The patient has a normal affect.  DATA:    LABS: His creatinine is 1.02.  GFR greater than 60.  White count 11.0.  Hemoglobin 11.9.  Platelets 258,000.  X-ray of the right foot does not show any evidence of fracture.  There is no evidence of osteomyelitis.  CAROTID DUPLEX: I reviewed his carotid duplex scan that was done on 01/25/2019.  This shows a left internal carotid artery occlusion which is chronic.  He has a greater than 50% stenosis in the right carotid stent.

## 2019-05-28 NOTE — Progress Notes (Signed)
[facility]                                                                              Patient Demographics  Jason [name], is a 68 y.o. male, DOB - Jun 12, 1951, [id number]  Admit date - 05/27/2019   Admitting Physician Georgette Shell, MD  Outpatient Primary MD for the patient is Patient, [name]  Outpatient specialists:   LOS - 1  days   Medical records reviewed and are as summarized below:    Chief Complaint  Patient presents with  . Foot Pain       Brief summary   Jason [name] is a 68 y.o. male  presented from [facility] with medical history of DM, not on medications for 1 month, b/l Carotid artery disease with R ICA stent, occluded L ICA stent,CVA, COPD, CAD s/p CABG in 2011, and and NSTEMI and DES to sVF- OM2 in 2018, HTN, GERD, nicotine abuse presented with about 4 days of right foot pain followed by a change in color of his 1st toe to black. He admits to fever, chills, nausea and vomiting. He reported subjective fevers/ chill about 1 wk ago. The foot then became red and painful and the toe then became purple and black. His pain is moderate and worse when walking, radiates up his leg. He has had nausea and occasional vomiting for 2 days. He has not been able to pick up his medications in at least 1 month as he lives in [place] and his pharmacy is in [place].  In ED sodium 125, CO2 18, WBCs 13.6.  Right foot x-ray showed no osteomyelitis or gas  Assessment & Plan    Principal Problem: Sepsis with right foot cellulitis, gangrene of the right first toe with history of PVD, right femoral endarterectomy 2004 - patient presented with subjective fevers, chills, hyponatremia, acidosis, leukocytosis, sec to cellulitis and gangrene  -Blood cultures no growth so far -Given underlying history of PVD, vascular surgery consulted.  Patient was seen by Dr. Doren Custard this morning, recommended arteriogram at [facility] and likely will need  amputation of the right great toe - will transfer patient to [facility], continue IV vancomycin and Zosyn   Active Problems: Diabetes mellitus type 2, uncontrolled with hyperglycemia, noncompliant, with right foot cellulitis, gangrene -Patient reports that he ran out of his Glucophage over a month ago, used to be on insulin in the past -Hemoglobin A1c 8.5, -CBGs elevated, fasting a.m. CBG 205, continue sliding scale insulin moderate, added Lantus 10 units at bedtime    Hyponatremia, has also underlying chronic hyponatremia -Sodium 129 today, corrected sodium 131 with hyperglycemia, however has dehydration from vomiting -Calculated osmolality 274 -Continue IV fluids   History of coronary disease s/p CABG, stent -Resume Plavix, aspirin, metoprolol, statin  Prolonged QTC, trigeminy -QTC 532, EKG changes, extensive cardiac history, continue telemetry, repeat EKG   Hypothyroidism -TSH 5.18, free T4 0.8, ran out of Synthroid, resumed  Depression Ran out of Zoloft, resumed  -If sodium trending down, will discontinue Zoloft   Hypertension Continue metoprolol  Nicotine use Patient counseled, continue nicotine patch  Code Status: Partial DVT Prophylaxis:  Lovenox  Family Communication: Discussed all imaging results, lab results, explained to the patient    Disposition Plan:l plan to transfer to [facility] once arteriogram schedule is decided  Time Spent in minutes 35 minutes  Procedures:  None  Consultants:   Vascular surgery, Dr. Scot Dock  Antimicrobials:   Anti-infectives (From admission, onward)   Start     Dose/Rate Route Frequency Ordered Stop   05/28/19 2200  vancomycin (VANCOCIN) 1,750 mg in sodium chloride 0.9 % 500 mL IVPB     1,750 mg 250 mL/hr over 120 Minutes Intravenous Every 24 hours 05/28/19 0610     05/28/19 1000  vancomycin (VANCOCIN) 1,750 mg in sodium chloride 0.9 % 500 mL IVPB  Status:  Discontinued     1,750 mg 250 mL/hr over 120  Minutes Intravenous Every 24 hours 05/27/19 1949 05/28/19 0610   05/27/19 2000  piperacillin-tazobactam (ZOSYN) IVPB 3.375 g  Status:  Discontinued     3.375 g 12.5 mL/hr over 240 Minutes Intravenous Every 8 hours 05/27/19 1500 05/27/19 1941   05/27/19 2000  piperacillin-tazobactam (ZOSYN) IVPB 3.375 g     3.375 g 12.5 mL/hr over 240 Minutes Intravenous Every 8 hours 05/27/19 1941     05/27/19 1800  piperacillin-tazobactam (ZOSYN) IVPB 3.375 g  Status:  Discontinued     3.375 g 100 mL/hr over 30 Minutes Intravenous  Once 05/27/19 1748 05/27/19 1941   05/27/19 1800  vancomycin (VANCOCIN) 1,500 mg in sodium chloride 0.9 % 500 mL IVPB     1,500 mg 250 mL/hr over 120 Minutes Intravenous  Once 05/27/19 1748 05/28/19 0543   05/27/19 1715  piperacillin-tazobactam (ZOSYN) IVPB 3.375 g  Status:  Discontinued     3.375 g 100 mL/hr over 30 Minutes Intravenous Every 6 hours 05/27/19 1212 05/27/19 1500   05/27/19 1215  piperacillin-tazobactam (ZOSYN) IVPB 3.375 g  Status:  Discontinued     3.375 g 100 mL/hr over 30 Minutes Intravenous Every 6 hours 05/27/19 1208 05/27/19 1212   05/27/19 1100  piperacillin-tazobactam (ZOSYN) IVPB 3.375 g     3.375 g 100 mL/hr over 30 Minutes Intravenous  Once 05/27/19 1055 05/27/19 1155          Medications  Scheduled Meds: . aspirin EC  81 mg Oral Daily  . clopidogrel  75 mg Oral Daily  . enoxaparin (LOVENOX) injection  40 mg Subcutaneous Q24H  . insulin aspart  0-15 Units Subcutaneous TID WC  . insulin aspart  0-5 Units Subcutaneous QHS  . isosorbide mononitrate  120 mg Oral Daily  . levothyroxine  50 mcg Oral Daily  . metoprolol succinate  100 mg Oral Daily  . nicotine  21 mg Transdermal Q24H  . rosuvastatin  10 mg Oral Daily  . sertraline  250 mg Oral QHS   Continuous Infusions: . sodium chloride Stopped (05/27/19 1155)  . piperacillin-tazobactam (ZOSYN)  IV 3.375 g (05/28/19 0555)  . vancomycin     PRN Meds:.sodium chloride, acetaminophen  **OR** acetaminophen, albuterol, famotidine, HYDROcodone-acetaminophen, oxyCODONE-acetaminophen      Subjective:   Jason [name] was seen and examined today.  Currently no acute complaints, right foot warm and erythematous, purplish right great toe.  No fevers or chills, nausea or vomiting.  Patient denies dizziness, chest pain, shortness of breath,  new weakness, numbess, tingling. No acute events overnight.    Objective:   Vitals:   05/27/19 1530 05/27/19 1702 05/27/19 1955 05/28/19 0410  BP: 118/60 (!) 146/69 123/64 Marland Kitchen)  119/92  Pulse: 93 81 90 74  Resp: (!) 25 16 17 18   Temp:  98.4 F (36.9 C) 98.7 F (37.1 C) 97.7 F (36.5 C)  TempSrc:  Oral Oral Oral  SpO2: 99% 99% 98% 97%  Weight:      Height:        Intake/Output Summary (Last 24 hours) at 05/28/2019 1318 Last data filed at 05/28/2019 [id number] Gross per 24 hour  Intake 1530 ml  Output 1080 ml  Net 450 ml     Wt Readings from Last 3 Encounters:  05/27/19 79.4 kg  03/02/19 77.2 kg  05/06/18 74.1 kg     Exam  General: Alert and oriented x 3, NAD  Eyes:   HEENT:  Atraumatic, normocephalic, normal oropharynx  Cardiovascular: S1 S2 auscultated, no murmurs, RRR  Respiratory: Clear to auscultation bilaterally, no wheezing, rales or rhonchi  Gastrointestinal: Soft, nontender, nondistended, + bowel sounds  Ext: no pedal edema bilaterally  Neuro: No new deficits  Musculoskeletal: No digital cyanosis, clubbing  Skin: Right foot cellulitis with erythema, warm, tender, right great toe purplish  Psych: Normal affect and demeanor, alert and oriented x3         Data Reviewed:  I have personally reviewed following labs and imaging studies  Micro Results Recent Results (from the past 240 hour(s))  Culture, blood (routine x 2)     Status: None (Preliminary result)   Collection Time: 05/27/19 10:45 AM   Specimen: BLOOD  Result Value Ref Range Status   Specimen Description BLOOD LEFT ANTECUBITAL  Final    Special Requests   Final    BOTTLES DRAWN AEROBIC ONLY Blood Culture results may not be optimal due to an inadequate volume of blood received in culture bottles   Culture   Final    NO GROWTH < 24 HOURS Performed at [facility], [place] [address] [place], [place] [address]    Report Status PENDING  Incomplete  Culture, blood (routine x 2)     Status: None (Preliminary result)   Collection Time: 05/27/19 11:00 AM   Specimen: BLOOD RIGHT WRIST  Result Value Ref Range Status   Specimen Description   Final    BLOOD RIGHT WRIST Performed at [facility], [address] [place], [place] [address]    Special Requests   Final    BOTTLES DRAWN AEROBIC AND ANAEROBIC Blood Culture adequate volume Performed at [facility], [address] [place], [place] [address]    Culture   Final    NO GROWTH < 24 HOURS Performed at [facility], [place] [address] [place], [place] [address]    Report Status PENDING  Incomplete  SARS CORONAVIRUS 2 (TAT 6-24 HRS) Nasopharyngeal Nasopharyngeal Swab     Status: None   Collection Time: 05/27/19 11:08 AM   Specimen: Nasopharyngeal Swab  Result Value Ref Range Status   SARS Coronavirus 2 NEGATIVE NEGATIVE Final    Comment: (NOTE) SARS-CoV-2 target nucleic acids are NOT DETECTED. The SARS-CoV-2 RNA is generally detectable in upper and lower respiratory specimens during the acute phase of infection. Negative results do not preclude SARS-CoV-2 infection, do not rule out co-infections with other pathogens, and should not be used as the sole basis for treatment or other patient management decisions. Negative results must be combined with clinical observations, patient history, and epidemiological information. The expected result is Negative. Fact Sheet for Patients: SugarRoll.be Fact Sheet for Healthcare Providers: https://www.woods-mathews.com/ This test  is not yet approved or cleared by  the [place] and  has been authorized for detection and/or diagnosis of SARS-CoV-2 by FDA under an Emergency Use Authorization (EUA). This EUA will remain  in effect (meaning this test can be used) for the duration of the COVID-19 declaration under Section 56 4(b)(1) of the Act, 21 U.S.C. section 360bbb-3(b)(1), unless the authorization is terminated or revoked sooner. Performed at [facility], [place] [address] [place],  [address]     Radiology Reports Dg Chest 1 View  Result Date: 05/27/2019 CLINICAL DATA:  Cellulitis. EXAM: CHEST  1 VIEW COMPARISON:  August 01, 2017. FINDINGS: The heart size and mediastinal contours are within normal limits. Both lungs are clear. Status post coronary bypass graft. The visualized skeletal structures are unremarkable. IMPRESSION: No active disease. Electronically Signed   By: Marijo Conception M.D.   On: 05/27/2019 11:34   Dg Foot Complete Right  Result Date: 05/27/2019 CLINICAL DATA:  Cellulitis.  Right foot swelling. EXAM: RIGHT FOOT COMPLETE - 3+ VIEW COMPARISON:  None. FINDINGS: There is no evidence of fracture or dislocation. There is no evidence of arthropathy or other focal bone abnormality. No lytic destruction is seen to suggest osteomyelitis. Soft tissues are unremarkable. IMPRESSION: Negative. Electronically Signed   By: Marijo Conception M.D.   On: 05/27/2019 11:36    Lab Data:  CBC: Recent Labs  Lab 05/27/19 1059 05/27/19 1837 05/28/19 0530  WBC 13.6* 11.6* 11.0*  NEUTROABS 10.2*  --   --   HGB 13.2 12.0* 11.9*  HCT 40.0 36.6* 36.5*  MCV 96.9 96.1 97.9  PLT 306 275 0000000   Basic Metabolic Panel: Recent Labs  Lab 05/27/19 1059 05/27/19 1837 05/28/19 0530  NA 125*  --  129*  K 4.2  --  4.1  CL 95*  --  100  CO2 18*  --  19*  GLUCOSE 265*  --  205*  BUN 10  --  14  CREATININE 0.88 1.02 1.02  CALCIUM 9.4  --  8.7*   GFR: Estimated Creatinine Clearance: 71.2 mL/min (by C-G formula based on SCr of 1.02  mg/dL). Liver Function Tests: No results for input(s): AST, ALT, ALKPHOS, BILITOT, PROT, ALBUMIN in the last 168 hours. No results for input(s): LIPASE, AMYLASE in the last 168 hours. No results for input(s): AMMONIA in the last 168 hours. Coagulation Profile: No results for input(s): INR, PROTIME in the last 168 hours. Cardiac Enzymes: No results for input(s): CKTOTAL, CKMB, CKMBINDEX, TROPONINI in the last 168 hours. BNP (last 3 results) No results for input(s): PROBNP in the last 8760 hours. HbA1C: Recent Labs    05/27/19 1837  HGBA1C 8.5*   CBG: Recent Labs  Lab 05/27/19 2240 05/28/19 0744 05/28/19 1151  GLUCAP 260* 202* 236*   Lipid Profile: No results for input(s): CHOL, HDL, LDLCALC, TRIG, CHOLHDL, LDLDIRECT in the last 72 hours. Thyroid Function Tests: Recent Labs    05/27/19 1837  TSH 5.180*  FREET4 0.82   Anemia Panel: No results for input(s): VITAMINB12, FOLATE, FERRITIN, TIBC, IRON, RETICCTPCT in the last 72 hours. Urine analysis:    Component Value Date/Time   COLORURINE YELLOW 07/08/2013 0226   APPEARANCEUR CLEAR 07/08/2013 0226   LABSPEC 1.006 07/08/2013 0226   PHURINE 6.0 07/08/2013 0226   GLUCOSEU NEGATIVE 07/08/2013 0226   HGBUR NEGATIVE 07/08/2013 0226   BILIRUBINUR NEGATIVE 07/08/2013 0226   KETONESUR NEGATIVE 07/08/2013 0226   PROTEINUR NEGATIVE 07/08/2013 0226   UROBILINOGEN  0.2 07/08/2013 0226   NITRITE NEGATIVE 07/08/2013 0226   LEUKOCYTESUR NEGATIVE 07/08/2013 0226     Ripudeep Rai M.D. [facility] 05/28/2019, 1:18 PM   Call night coverage person covering after 7pm

## 2019-05-29 DIAGNOSIS — E0810 Diabetes mellitus due to underlying condition with ketoacidosis without coma: Secondary | ICD-10-CM

## 2019-05-29 LAB — GLUCOSE, CAPILLARY
Glucose-Capillary: 174 mg/dL — ABNORMAL HIGH (ref 70–99)
Glucose-Capillary: 229 mg/dL — ABNORMAL HIGH (ref 70–99)
Glucose-Capillary: 139 mg/dL — ABNORMAL HIGH (ref 70–99)
Glucose-Capillary: 208 mg/dL — ABNORMAL HIGH (ref 70–99)

## 2019-05-29 LAB — BASIC METABOLIC PANEL
Anion gap: 11 (ref 5–15)
BUN: 16 mg/dL (ref 8–23)
CO2: 22 mmol/L (ref 22–32)
Calcium: 9.3 mg/dL (ref 8.9–10.3)
Chloride: 98 mmol/L (ref 98–111)
Creatinine, Ser: 1.23 mg/dL (ref 0.61–1.24)
GFR calc Af Amer: >60 mL/min (ref >60)
GFR calc non Af Amer: >60 mL/min (ref >60)
Glucose, Bld: 210 mg/dL — ABNORMAL HIGH (ref 70–99)
Potassium: 4.4 mmol/L (ref 3.5–5.1)
Sodium: 131 mmol/L — ABNORMAL LOW (ref 135–145)

## 2019-05-29 LAB — CREATININE, SERUM
Creatinine, Ser: 1.12 mg/dL (ref 0.61–1.24)
GFR calc Af Amer: >60 mL/min (ref >60)
GFR calc non Af Amer: >60 mL/min (ref >60)

## 2019-05-29 MED ORDER — METOPROLOL SUCCINATE ER 25 MG PO TB24
25.0000 mg | ORAL_TABLET | Freq: Every day | ORAL | Status: DC
  Administered 2019-05-29 – 2019-06-07 (×10): 25 mg via ORAL
  Filled 2019-05-29 (×10): qty 1

## 2019-05-29 MED ORDER — VANCOMYCIN HCL 10 G IV SOLR
1500.0000 mg | INTRAVENOUS | Status: DC
  Administered 2019-05-29 – 2019-06-02 (×4): 1500 mg via INTRAVENOUS
  Filled 2019-05-29 (×6): qty 1500

## 2019-05-29 MED ORDER — METRONIDAZOLE IN NACL 5-0.79 MG/ML-% IV SOLN
500.0000 mg | Freq: Three times a day (TID) | INTRAVENOUS | Status: DC
  Administered 2019-05-29 – 2019-06-03 (×13): 500 mg via INTRAVENOUS
  Filled 2019-05-29 (×13): qty 100

## 2019-05-29 MED ORDER — SODIUM CHLORIDE 0.9 % IV SOLN
2.0000 g | Freq: Three times a day (TID) | INTRAVENOUS | Status: DC
  Administered 2019-05-29 – 2019-06-03 (×14): 2 g via INTRAVENOUS
  Filled 2019-05-29 (×19): qty 2

## 2019-05-29 NOTE — Progress Notes (Signed)
Pharmacy Antibiotic Note  Jason [name] is a 68 y.o. male admitted on 05/27/2019 with cellulitis.  Pharmacy has been consulted for vancomycin + cefepime dosing.  Pt presents with CC of pain/redness in right foot. PMH significant for DM (not on medications for past month per H&P). Vascular surgery consulted - pt has cellulitis with gangrene of the right great toe and will require arteriogram. Possible transfer to [facility].   Today, 05/29/19  WBC slightly elevated  SCr 1.23, slightly increased. CrCl ~ 59 mL/min  This is day #2 of full dose IV antibiotics. Currently prescribed ZEI + vancomycin 1750 mg IV q24h.  Afebrile  Discussed with MD, will change piperacillin/tazobactam to cefepime + metronidazole for reduced risk of AKI in combination with vancomycin.  Plan:  Discontinue piperacillin/tazobactam  Start cefepime 2 g IV q12h  Metronidazole 500 mg IV q8h  Reduce vancomycin to 1500 mg IV q24h  Goal vancomycin AUC 400-550  Follow renal function closely, SCr daily  Check vancomycin levels once at steady state if indicated  Height: 5\' 9"  (175.3 cm) Weight: 175 lb (79.4 kg) IBW/kg (Calculated) : 70.7  Temp (24hrs), Avg:97.9 F (36.6 C), Min:97.5 F (36.4 C), Max:98.4 F (36.9 C)  Recent Labs  Lab 05/27/19 1059 05/27/19 1837 05/28/19 0530 05/29/19 0624 05/29/19 1216  WBC 13.6* 11.6* 11.0*  --   --   CREATININE 0.88 1.02 1.02 1.12 1.23  LATICACIDVEN 1.8  --   --   --   --     Estimated Creatinine Clearance: 59.1 mL/min (by C-G formula based on SCr of 1.23 mg/dL).    Allergies  Allergen Reactions  . Coreg [Carvedilol] Nausea And Vomiting and Other (See Comments)    Per patient made him dizzy and light sensitive  . Sulfa Antibiotics Nausea And Vomiting and Other (See Comments)    Also headaches     Antimicrobials this admission: Piperacillin/tazobactam 12/4 >> 12/6 Cefepime 12/6 >> Metronidazole 12/6 >> vancomycin 12/5 >>   Dose adjustments this  admission: 12/6: vancomycin 1750 mg IV q24h --> 1500 mg IV q24h  Microbiology results: 12/4 BCx: ngtd 12/4 SARS-2: Negative  Thank you for allowing pharmacy to be a part of this patient's care.  Lenis Noon, PharmD 05/29/2019 2:20 PM

## 2019-05-29 NOTE — Progress Notes (Signed)
NEW ADMISSION NOTE New Admission Note:   Arrival Method: EMS via stretcher  Mental Orientation: alert and oriented x4  Telemetry: box: 09 Assessment: Completed Skin: cellulitis of right great toe  IV: right forearm  Pain: 4 (0 to 10) right great toe  Safety Measures: Safety Fall Prevention Plan has been given, discussed and signed Admission: Completed [facility] Orientation: Patient has been orientated to the room, unit and staff.   Orders have been reviewed and implemented. Will continue to monitor the patient. Call light has been placed within reach and bed alarm has been activated.   Baldo Ash, RN

## 2019-05-29 NOTE — Plan of Care (Signed)
  Problem: Education: Goal: Knowledge of General Education information will improve Description Including pain rating scale, medication(s)/side effects and non-pharmacologic comfort measures Outcome: Progressing   

## 2019-05-29 NOTE — Progress Notes (Signed)
[facility]                                                                              Patient Demographics  Jason [name], is a 68 y.o. male, DOB - [birth year], [id number]  Admit date - 05/27/2019   Admitting Physician Georgette Shell, MD  Outpatient Primary MD for the patient is Patient, [name]  Outpatient specialists:   LOS - 2  days   Medical records reviewed and are as summarized below:    Chief Complaint  Patient presents with  . Foot Pain       Brief summary   Jason [name] is a 68 y.o. male  presented from [place] [facility] with medical history of DM, not on medications for 1 month, b/l Carotid artery disease with R ICA stent, occluded L ICA stent,CVA, COPD, CAD s/p CABG in 2011, and and NSTEMI and DES to sVF- OM2 in 2018, HTN, GERD, nicotine abuse presented with about 4 days of right foot pain followed by a change in color of his 1st toe to black. He admits to fever, chills, nausea and vomiting. He reported subjective fevers/ chill about 1 wk ago. The foot then became red and painful and the toe then became purple and black. His pain is moderate and worse when walking, radiates up his leg. He has had nausea and occasional vomiting for 2 days. He has not been able to pick up his medications in at least 1 month as he lives in [place] and his pharmacy is in [place].  In ED sodium 125, CO2 18, WBCs 13.6.  Right foot x-ray showed no osteomyelitis or gas  Assessment & Plan    Principal Problem: Sepsis with right foot cellulitis, gangrene of the right first toe with history of PVD, right femoral endarterectomy 2004 -Met sepsis criteria, patient presented with subjective fevers, chills, hyponatremia, acidosis, leukocytosis, sec to cellulitis and gangrene  -Blood cultures no growth so far -Given underlying history of PVD, vascular surgery consulted.   -Patient was seen by Dr. Scot Dock, vascular surgery patient, recommended to transfer  to [facility] for arteriogram and may need amputation of the right great toe after that  -Continue IV vancomycin and Zosyn   Active Problems: Diabetes mellitus type 2, uncontrolled with hyperglycemia, noncompliant, with right foot cellulitis, gangrene -Patient reports that he ran out of his Glucophage over a month ago, used to be on insulin in the past -Hemoglobin A1c 8.5, -Continue sliding scale insulin moderate, CBGs better after adding Lantus, continue 10 units at bedtime     Hyponatremia, has also underlying chronic hyponatremia -Sodium 129 today, corrected sodium 131 with hyperglycemia, however has dehydration from vomiting -Calculated osmolality 274 -Continue IV fluids   History of coronary disease s/p CABG, stent -Resume Plavix, aspirin, metoprolol, statin  Prolonged QTC, trigeminy -QTC 532, EKG changes, extensive cardiac history, continue telemetry, repeat EKG   Hypothyroidism -TSH 5.18, free T4 0.8, ran out of Synthroid, resumed  Depression Ran out of Zoloft, resumed  -Check BMET  for sodium trend   Essential hypertension BP currently soft, hold Imdur, decreased Toprol-XL to 25 mg  daily  Nicotine use Patient counseled, continue nicotine patch   Code Status: Partial DVT Prophylaxis:  Lovenox  Family Communication: Discussed all imaging results, lab results, explained to the patient    Disposition Plan:    Transfer to [facility] as per vascular surgery recommendations Signout given to Dr. Hayes Ludwig, [facility]  Time Spent in minutes 25 minutes  Procedures:  None  Consultants:   Vascular surgery, Dr. Scot Dock  Antimicrobials:   Anti-infectives (From admission, onward)   Start     Dose/Rate Route Frequency Ordered Stop   05/28/19 2200  vancomycin (VANCOCIN) 1,750 mg in sodium chloride 0.9 % 500 mL IVPB     1,750 mg 250 mL/hr over 120 Minutes Intravenous Every 24 hours 05/28/19 0610     05/28/19 1000  vancomycin (VANCOCIN) 1,750 mg in sodium  chloride 0.9 % 500 mL IVPB  Status:  Discontinued     1,750 mg 250 mL/hr over 120 Minutes Intravenous Every 24 hours 05/27/19 1949 05/28/19 0610   05/27/19 2000  piperacillin-tazobactam (ZOSYN) IVPB 3.375 g  Status:  Discontinued     3.375 g 12.5 mL/hr over 240 Minutes Intravenous Every 8 hours 05/27/19 1500 05/27/19 1941   05/27/19 2000  piperacillin-tazobactam (ZOSYN) IVPB 3.375 g     3.375 g 12.5 mL/hr over 240 Minutes Intravenous Every 8 hours 05/27/19 1941     05/27/19 1800  piperacillin-tazobactam (ZOSYN) IVPB 3.375 g  Status:  Discontinued     3.375 g 100 mL/hr over 30 Minutes Intravenous  Once 05/27/19 1748 05/27/19 1941   05/27/19 1800  vancomycin (VANCOCIN) 1,500 mg in sodium chloride 0.9 % 500 mL IVPB     1,500 mg 250 mL/hr over 120 Minutes Intravenous  Once 05/27/19 1748 05/28/19 0543   05/27/19 1715  piperacillin-tazobactam (ZOSYN) IVPB 3.375 g  Status:  Discontinued     3.375 g 100 mL/hr over 30 Minutes Intravenous Every 6 hours 05/27/19 1212 05/27/19 1500   05/27/19 1215  piperacillin-tazobactam (ZOSYN) IVPB 3.375 g  Status:  Discontinued     3.375 g 100 mL/hr over 30 Minutes Intravenous Every 6 hours 05/27/19 1208 05/27/19 1212   05/27/19 1100  piperacillin-tazobactam (ZOSYN) IVPB 3.375 g     3.375 g 100 mL/hr over 30 Minutes Intravenous  Once 05/27/19 1055 05/27/19 1155         Medications  Scheduled Meds: . aspirin EC  81 mg Oral Daily  . clopidogrel  75 mg Oral Daily  . enoxaparin (LOVENOX) injection  40 mg Subcutaneous Q24H  . insulin aspart  0-15 Units Subcutaneous TID WC  . insulin aspart  0-5 Units Subcutaneous QHS  . insulin glargine  10 Units Subcutaneous QHS  . levothyroxine  50 mcg Oral Daily  . metoprolol succinate  25 mg Oral Daily  . nicotine  21 mg Transdermal Q24H  . rosuvastatin  10 mg Oral Daily  . sertraline  250 mg Oral QHS   Continuous Infusions: . sodium chloride Stopped (05/27/19 1155)  . piperacillin-tazobactam (ZOSYN)  IV 3.375 g  (05/29/19 0503)  . vancomycin Stopped (05/29/19 0000)   PRN Meds:.sodium chloride, acetaminophen **OR** acetaminophen, albuterol, famotidine, HYDROcodone-acetaminophen, oxyCODONE-acetaminophen      Subjective:   Keldrick Pomplun was seen and examined today.  No complaints, overnight no fevers.  Right foot still warm, erythematous, red great toe purplish.   Patient denies dizziness, chest pain, shortness of breath,  new weakness, numbess, tingling. No acute events overnight.    Objective:   Vitals:   05/28/19  1415 05/28/19 2126 05/29/19 0634 05/29/19 0939  BP: 108/84 (!) 110/53 (!) 116/40 (!) 106/54  Pulse: 74 71 66 80  Resp: '20 20 20   '$ Temp: 98.6 F (37 C) 98.4 F (36.9 C) 97.9 F (36.6 C)   TempSrc: Oral Oral Oral   SpO2: 98% 98% 97% 98%  Weight:      Height:        Intake/Output Summary (Last 24 hours) at 05/29/2019 1118 Last data filed at 05/29/2019 0900 Gross per 24 hour  Intake 3228.51 ml  Output -  Net 3228.51 ml     Wt Readings from Last 3 Encounters:  05/27/19 79.4 kg  03/02/19 77.2 kg  05/06/18 74.1 kg      Physical Exam  General: Alert and oriented x 3, NAD  Eyes:   HEENT:  Atraumatic, normocephalic  Cardiovascular: S1 S2 clear, no murmurs, RRR. No pedal edema b/l  Respiratory: CTAB, no wheezing, rales or rhonchi  Gastrointestinal: Soft, nontender, nondistended, NBS  Ext: no pedal edema bilaterally  Neuro: no new deficits  Musculoskeletal: No cyanosis, clubbing  Skin: Right foot cellulitis with purplish right great toe (as below)  Psych: Normal affect and demeanor, alert and oriented x3            Data Reviewed:  I have personally reviewed following labs and imaging studies  Micro Results Recent Results (from the past 240 hour(s))  Culture, blood (routine x 2)     Status: None (Preliminary result)   Collection Time: 05/27/19 10:45 AM   Specimen: BLOOD  Result Value Ref Range Status   Specimen Description BLOOD LEFT  ANTECUBITAL  Final   Special Requests   Final    BOTTLES DRAWN AEROBIC ONLY Blood Culture results may not be optimal due to an inadequate volume of blood received in culture bottles   Culture   Final    NO GROWTH 2 DAYS Performed at [facility], [place] [address], [place], [place] [address]    Report Status PENDING  Incomplete  Culture, blood (routine x 2)     Status: None (Preliminary result)   Collection Time: 05/27/19 11:00 AM   Specimen: BLOOD RIGHT WRIST  Result Value Ref Range Status   Specimen Description   Final    BLOOD RIGHT WRIST Performed at [facility], [address] [place], [place] [address]    Special Requests   Final    BOTTLES DRAWN AEROBIC AND ANAEROBIC Blood Culture adequate volume Performed at [facility], [address] [place], [place] [address]    Culture   Final    NO GROWTH 2 DAYS Performed at [facility], [place] [address], [place], [place] [address]    Report Status PENDING  Incomplete  SARS CORONAVIRUS 2 (TAT 6-24 HRS) Nasopharyngeal Nasopharyngeal Swab     Status: None   Collection Time: 05/27/19 11:08 AM   Specimen: Nasopharyngeal Swab  Result Value Ref Range Status   SARS Coronavirus 2 NEGATIVE NEGATIVE Final    Comment: (NOTE) SARS-CoV-2 target nucleic acids are NOT DETECTED. The SARS-CoV-2 RNA is generally detectable in upper and lower respiratory specimens during the acute phase of infection. Negative results do not preclude SARS-CoV-2 infection, do not rule out co-infections with other pathogens, and should not be used as the sole basis for treatment or other patient management decisions. Negative results must be combined with clinical observations, patient history, and epidemiological information. The expected result is Negative. Fact Sheet  for Patients: SugarRoll.be Fact Sheet for Healthcare Providers: https://www.woods-mathews.com/ This test is not yet approved  or cleared by the [place] FDA and  has been authorized for detection and/or diagnosis of SARS-CoV-2 by FDA under an Emergency Use Authorization (EUA). This EUA will remain  in effect (meaning this test can be used) for the duration of the COVID-19 declaration under Section 56 4(b)(1) of the Act, 21 U.S.C. section 360bbb-3(b)(1), unless the authorization is terminated or revoked sooner. Performed at [facility], [place] [address], [place], [place] [address]     Radiology Reports Dg Chest 1 View  Result Date: 05/27/2019 CLINICAL DATA:  Cellulitis. EXAM: CHEST  1 VIEW COMPARISON:  August 01, 2017. FINDINGS: The heart size and mediastinal contours are within normal limits. Both lungs are clear. Status post coronary bypass graft. The visualized skeletal structures are unremarkable. IMPRESSION: No active disease. Electronically Signed   By: Marijo Conception M.D.   On: 05/27/2019 11:34   Dg Foot Complete Right  Result Date: 05/27/2019 CLINICAL DATA:  Cellulitis.  Right foot swelling. EXAM: RIGHT FOOT COMPLETE - 3+ VIEW COMPARISON:  None. FINDINGS: There is no evidence of fracture or dislocation. There is no evidence of arthropathy or other focal bone abnormality. No lytic destruction is seen to suggest osteomyelitis. Soft tissues are unremarkable. IMPRESSION: Negative. Electronically Signed   By: Marijo Conception M.D.   On: 05/27/2019 11:36    Lab Data:  CBC: Recent Labs  Lab 05/27/19 1059 05/27/19 1837 05/28/19 0530  WBC 13.6* 11.6* 11.0*  NEUTROABS 10.2*  --   --   HGB 13.2 12.0* 11.9*  HCT 40.0 36.6* 36.5*  MCV 96.9 96.1 97.9  PLT 306 275 371   Basic Metabolic Panel: Recent Labs  Lab 05/27/19 1059 05/27/19 1837 05/28/19 0530 05/29/19 0624  NA 125*  --  129*  --   K 4.2  --  4.1  --   CL 95*  --  100  --   CO2 18*  --  19*  --   GLUCOSE 265*  --  205*  --   BUN 10  --  14  --   CREATININE 0.88 1.02 1.02 1.12  CALCIUM 9.4  --  8.7*  --    GFR: Estimated  Creatinine Clearance: 64.9 mL/min (by C-G formula based on SCr of 1.12 mg/dL). Liver Function Tests: No results for input(s): AST, ALT, ALKPHOS, BILITOT, PROT, ALBUMIN in the last 168 hours. No results for input(s): LIPASE, AMYLASE in the last 168 hours. No results for input(s): AMMONIA in the last 168 hours. Coagulation Profile: No results for input(s): INR, PROTIME in the last 168 hours. Cardiac Enzymes: No results for input(s): CKTOTAL, CKMB, CKMBINDEX, TROPONINI in the last 168 hours. BNP (last 3 results) No results for input(s): PROBNP in the last 8760 hours. HbA1C: Recent Labs    05/27/19 1837  HGBA1C 8.5*   CBG: Recent Labs  Lab 05/28/19 0744 05/28/19 1151 05/28/19 1616 05/28/19 2128 05/29/19 0757  GLUCAP 202* 236* 181* 167* 174*   Lipid Profile: No results for input(s): CHOL, HDL, LDLCALC, TRIG, CHOLHDL, LDLDIRECT in the last 72 hours. Thyroid Function Tests: Recent Labs    05/27/19 1837  TSH 5.180*  FREET4 0.82   Anemia Panel: No results for input(s): VITAMINB12, FOLATE, FERRITIN, TIBC, IRON, RETICCTPCT in the last 72 hours. Urine analysis:    Component Value Date/Time   COLORURINE YELLOW 07/08/2013 0226   APPEARANCEUR CLEAR 07/08/2013 0226   LABSPEC 1.006  07/08/2013 0226   PHURINE 6.0 07/08/2013 0226   GLUCOSEU NEGATIVE 07/08/2013 0226   HGBUR NEGATIVE 07/08/2013 0226   BILIRUBINUR NEGATIVE 07/08/2013 0226   KETONESUR NEGATIVE 07/08/2013 0226   PROTEINUR NEGATIVE 07/08/2013 0226   UROBILINOGEN 0.2 07/08/2013 0226   NITRITE NEGATIVE 07/08/2013 0226   LEUKOCYTESUR NEGATIVE 07/08/2013 0226     Shacoya Burkhammer M.D. [facility] 05/29/2019, 11:18 AM   Call night coverage person covering after 7pm

## 2019-05-29 NOTE — Progress Notes (Signed)
VASCULAR SURGERY:  As per my consult note yesterday this patient will need an arteriogram.  I have tentatively scheduled this for Tuesday by one of my partners.  However as Dr. Quay Burow has been following the patient we will discuss this with him on Monday and he may wish to do the arteriogram.  Regardless it would make sense to transfer the patient to [facility] if possible to expediently the process.  Deitra Mayo, MD Office: [phone]

## 2019-05-30 ENCOUNTER — Inpatient Hospital Stay (HOSPITAL_COMMUNITY): Payer: Medicare Other

## 2019-05-30 DIAGNOSIS — Z01810 Encounter for preprocedural cardiovascular examination: Secondary | ICD-10-CM | POA: Diagnosis not present

## 2019-05-30 LAB — GLUCOSE, CAPILLARY
Glucose-Capillary: 288 mg/dL — ABNORMAL HIGH (ref 70–99)
Glucose-Capillary: 209 mg/dL — ABNORMAL HIGH (ref 70–99)
Glucose-Capillary: 140 mg/dL — ABNORMAL HIGH (ref 70–99)
Glucose-Capillary: 97 mg/dL (ref 70–99)
Glucose-Capillary: 139 mg/dL — ABNORMAL HIGH (ref 70–99)

## 2019-05-30 LAB — CBC
HCT: 35.1 % — ABNORMAL LOW (ref 39.0–52.0)
Hemoglobin: 11.5 g/dL — ABNORMAL LOW (ref 13.0–17.0)
MCH: 32 pg (ref 26.0–34.0)
MCHC: 32.8 g/dL (ref 30.0–36.0)
MCV: 97.8 fL (ref 80.0–100.0)
Platelets: 298 10*3/uL (ref 150–400)
RBC: 3.59 MIL/uL — ABNORMAL LOW (ref 4.22–5.81)
RDW: 12.3 % (ref 11.5–15.5)
WBC: 9.7 10*3/uL (ref 4.0–10.5)
nRBC: 0 % (ref 0.0–0.2)

## 2019-05-30 LAB — BASIC METABOLIC PANEL
Anion gap: 10 (ref 5–15)
BUN: 15 mg/dL (ref 8–23)
CO2: 21 mmol/L — ABNORMAL LOW (ref 22–32)
Calcium: 9.4 mg/dL (ref 8.9–10.3)
Chloride: 102 mmol/L (ref 98–111)
Creatinine, Ser: 1.13 mg/dL (ref 0.61–1.24)
GFR calc Af Amer: >60 mL/min (ref >60)
GFR calc non Af Amer: >60 mL/min (ref >60)
Glucose, Bld: 192 mg/dL — ABNORMAL HIGH (ref 70–99)
Potassium: 4.4 mmol/L (ref 3.5–5.1)
Sodium: 133 mmol/L — ABNORMAL LOW (ref 135–145)

## 2019-05-30 MED ORDER — INSULIN GLARGINE 100 UNIT/ML ~~LOC~~ SOLN
15.0000 [IU] | Freq: Every day | SUBCUTANEOUS | Status: DC
  Administered 2019-05-30 – 2019-06-04 (×6): 15 [IU] via SUBCUTANEOUS
  Filled 2019-05-30 (×8): qty 0.15

## 2019-05-30 NOTE — Progress Notes (Signed)
  Progress Note    05/30/2019 12:48 PM * No surgery date entered *  Subjective: Having right great toe pain as well as ankle pain  Vitals:   05/30/19 0458 05/30/19 0848  BP: 130/80 (!) 123/56  Pulse: 73 67  Resp: 16 20  Temp: 98 F (36.7 C) 97.6 F (36.4 C)  SpO2: 100% 98%    Physical Exam: Awake alert oriented Nonlabored respirations Right great toe with gangrenous changes, appears erythema resolving on right lower extremity  CBC    Component Value Date/Time   WBC 9.7 05/30/2019 0509   RBC 3.59 (L) 05/30/2019 0509   HGB 11.5 (L) 05/30/2019 0509   HCT 35.1 (L) 05/30/2019 0509   PLT 298 05/30/2019 0509   MCV 97.8 05/30/2019 0509   MCV 96.7 12/23/2016 1549   MCH 32.0 05/30/2019 0509   MCHC 32.8 05/30/2019 0509   RDW 12.3 05/30/2019 0509   LYMPHSABS 2.3 05/27/2019 1059   MONOABS 0.8 05/27/2019 1059   EOSABS 0.1 05/27/2019 1059   BASOSABS 0.1 05/27/2019 1059    BMET    Component Value Date/Time   NA 133 (L) 05/30/2019 0509   NA 131 (L) 09/30/2017 1629   K 4.4 05/30/2019 0509   CL 102 05/30/2019 0509   CO2 21 (L) 05/30/2019 0509   GLUCOSE 192 (H) 05/30/2019 0509   BUN 15 05/30/2019 0509   BUN 13 09/30/2017 1629   CREATININE 1.13 05/30/2019 0509   CREATININE 1.14 07/12/2015 1220   CALCIUM 9.4 05/30/2019 0509   GFRNONAA >60 05/30/2019 0509   GFRNONAA 69 07/12/2015 1220   GFRAA >60 05/30/2019 0509   GFRAA 79 07/12/2015 1220    INR    Component Value Date/Time   INR 1.01 10/23/2016 0528     Intake/Output Summary (Last 24 hours) at 05/30/2019 1248 Last data filed at 05/30/2019 0834 Gross per 24 hour  Intake 2320 ml  Output 0 ml  Net 2320 ml     Assessment/plan:  68 y.o. male is here with gangrenous changes right lower extremity.  We will plan for aortogram with bilateral lower extremity runoff possible intervention on the right.  Patient is already on aspirin, Plavix, statin.  Will need to be n.p.o. past midnight.   Margareth Kanner C. Donzetta Matters, MD [facility] Office: [phone] Pager: [phone]  05/30/2019 12:48 PM

## 2019-05-30 NOTE — Progress Notes (Signed)
VASCULAR LAB PRELIMINARY  PRELIMINARY  PRELIMINARY  PRELIMINARY  ABIs completed.    Preliminary report:  See CV proc for preliminary results.  Shantice Menger, RVT 05/30/2019, 3:38 PM

## 2019-05-30 NOTE — Care Management Important Message (Signed)
Important Message  Patient Details  Name: Jason [name] MRN: [id number] Date of Birth: 02-Jun-1951   Medicare Important Message Given:  Yes     Orbie Pyo 05/30/2019, 4:11 PM

## 2019-05-30 NOTE — Progress Notes (Signed)
[facility]                                                                              Patient Demographics  Jason [name], is a 68 y.o. male, DOB - [birth year], LFY:[id number]  Admit date - 05/27/2019   Admitting Physician Georgette Shell, MD  Outpatient Primary MD for the patient is Patient, [name]  Outpatient specialists:   LOS - 3  days   Medical records reviewed and are as summarized below:    Chief Complaint  Patient presents with  . Foot Pain       Brief summary   Jason [name] is a 68 y.o. male  presented from [place] [facility] with medical history of DM, not on medications for 1 month, b/l Carotid artery disease with R ICA stent, occluded L ICA stent,CVA, COPD, CAD s/p CABG in 2011, and and NSTEMI and DES to sVF- OM2 in 2018, HTN, GERD, nicotine abuse presented with about 4 days of right foot pain followed by a change in color of his 1st toe to black. He admits to fever, chills, nausea and vomiting. He reported subjective fevers/ chill about 1 wk ago. The foot then became red and painful and the toe then became purple and black. His pain is moderate and worse when walking, radiates up his leg. He has had nausea and occasional vomiting for 2 days. He has not been able to pick up his medications in at least 1 month as he lives in [place] and his pharmacy is in [place].  In ED sodium 125, CO2 18, WBCs 13.6.  Right foot x-ray showed no osteomyelitis or gas.  He was admitted under hospital service due to sepsis secondary to right foot cellulitis/gangrene of the first toe with history of PVD.  Blood culture negative to date.  He was started on Zosyn and vancomycin.  He was seen by vascular surgery and they recommended aortogram for which patient was transferred to [facility] on 05/29/2019.  Assessment & Plan    Principal Problem: Sepsis with right foot cellulitis, gangrene of the right first toe with history of PVD, right femoral  endarterectomy 2004 -Met sepsis criteria. Blood cultures no growth so far. Given underlying history of PVD, vascular surgery consulted.   -Patient was seen by Dr. Scot Dock, vascular surgery patient, recommended to transfer to [facility] for arteriogram and may need amputation of the right great toe after that.  He is scheduled to have aortogram tomorrow. -Continue IV vancomycin and Zosyn   Active Problems: Diabetes mellitus type 2, uncontrolled with hyperglycemia, noncompliant, with right foot cellulitis, gangrene -Patient reports that he ran out of his Glucophage over a month ago, used to be on insulin in the past -Hemoglobin A1c 8.5, Blood sugar control.  Continue 10 units of Lantus and SSI.  Hyponatremia, has also underlying chronic hyponatremia -Sodium 129 today, corrected sodium 131 with hyperglycemia, however has dehydration from vomiting -Calculated osmolality 274 -Continue IV fluids.  133 today.  History of coronary disease s/p CABG, stent -Continue Plavix, aspirin, metoprolol, statin  Prolonged QTC, trigeminy -QTC 532, EKG changes, extensive cardiac history,  continue telemetry, repeat EKG  Hypothyroidism -TSH 5.18, free T4 0.8, ran out of Synthroid, resumed  Depression Ran out of Zoloft, resumed  -Check BMET  for sodium trend  Essential hypertension Blood pressure fairly stable.,  Continue to hold Imdur, and continue decreased dose of Toprol-XL to 25 mg daily  Nicotine use Patient counseled, continue nicotine patch  Code Status: Partial (yes to everything but no intubation) DVT Prophylaxis:  Lovenox  Family Communication: Discussed all imaging results, lab results, explained to the patient    Disposition Plan: To be determined based on further vascular work-up.  Time Spent in minutes 25 minutes  Procedures:  None  Consultants:   Vascular surgery, Dr. Scot Dock  Antimicrobials:   Anti-infectives (From admission, onward)   Start     Dose/Rate Route  Frequency Ordered Stop   05/29/19 2200  vancomycin (VANCOCIN) 1,500 mg in sodium chloride 0.9 % 500 mL IVPB     1,500 mg 250 mL/hr over 120 Minutes Intravenous Every 24 hours 05/29/19 1419     05/29/19 2200  metroNIDAZOLE (FLAGYL) IVPB 500 mg     500 mg 100 mL/hr over 60 Minutes Intravenous Every 8 hours 05/29/19 1427     05/29/19 2200  ceFEPIme (MAXIPIME) 2 g in sodium chloride 0.9 % 100 mL IVPB     2 g 200 mL/hr over 30 Minutes Intravenous Every 8 hours 05/29/19 1428     05/28/19 2200  vancomycin (VANCOCIN) 1,750 mg in sodium chloride 0.9 % 500 mL IVPB  Status:  Discontinued     1,750 mg 250 mL/hr over 120 Minutes Intravenous Every 24 hours 05/28/19 0610 05/29/19 1419   05/28/19 1000  vancomycin (VANCOCIN) 1,750 mg in sodium chloride 0.9 % 500 mL IVPB  Status:  Discontinued     1,750 mg 250 mL/hr over 120 Minutes Intravenous Every 24 hours 05/27/19 1949 05/28/19 0610   05/27/19 2000  piperacillin-tazobactam (ZOSYN) IVPB 3.375 g  Status:  Discontinued     3.375 g 12.5 mL/hr over 240 Minutes Intravenous Every 8 hours 05/27/19 1500 05/27/19 1941   05/27/19 2000  piperacillin-tazobactam (ZOSYN) IVPB 3.375 g  Status:  Discontinued     3.375 g 12.5 mL/hr over 240 Minutes Intravenous Every 8 hours 05/27/19 1941 05/29/19 1427   05/27/19 1800  piperacillin-tazobactam (ZOSYN) IVPB 3.375 g  Status:  Discontinued     3.375 g 100 mL/hr over 30 Minutes Intravenous  Once 05/27/19 1748 05/27/19 1941   05/27/19 1800  vancomycin (VANCOCIN) 1,500 mg in sodium chloride 0.9 % 500 mL IVPB     1,500 mg 250 mL/hr over 120 Minutes Intravenous  Once 05/27/19 1748 05/28/19 0543   05/27/19 1715  piperacillin-tazobactam (ZOSYN) IVPB 3.375 g  Status:  Discontinued     3.375 g 100 mL/hr over 30 Minutes Intravenous Every 6 hours 05/27/19 1212 05/27/19 1500   05/27/19 1215  piperacillin-tazobactam (ZOSYN) IVPB 3.375 g  Status:  Discontinued     3.375 g 100 mL/hr over 30 Minutes Intravenous Every 6 hours 05/27/19  1208 05/27/19 1212   05/27/19 1100  piperacillin-tazobactam (ZOSYN) IVPB 3.375 g     3.375 g 100 mL/hr over 30 Minutes Intravenous  Once 05/27/19 1055 05/27/19 1155         Medications  Scheduled Meds: . aspirin EC  81 mg Oral Daily  . clopidogrel  75 mg Oral Daily  . enoxaparin (LOVENOX) injection  40 mg Subcutaneous Q24H  . insulin aspart  0-15 Units Subcutaneous TID WC  . insulin  aspart  0-5 Units Subcutaneous QHS  . insulin glargine  15 Units Subcutaneous QHS  . levothyroxine  50 mcg Oral Daily  . metoprolol succinate  25 mg Oral Daily  . nicotine  21 mg Transdermal Q24H  . rosuvastatin  10 mg Oral Daily  . sertraline  250 mg Oral QHS   Continuous Infusions: . sodium chloride Stopped (05/27/19 1155)  . ceFEPime (MAXIPIME) IV 2 g (05/30/19 0609)  . metronidazole 500 mg (05/30/19 2952)  . vancomycin 1,500 mg (05/29/19 2311)   PRN Meds:.sodium chloride, acetaminophen **OR** acetaminophen, albuterol, famotidine, HYDROcodone-acetaminophen, oxyCODONE-acetaminophen      Subjective:   Patient seen and examined.  Complains of right foot pain.  No other complaint.  Objective:   Vitals:   05/29/19 2105 05/30/19 0455 05/30/19 0458 05/30/19 0848  BP: 130/70  130/80 (!) 123/56  Pulse: 78  73 67  Resp: (!) '24  16 20  '$ Temp: 98.7 F (37.1 C)  98 F (36.7 C) 97.6 F (36.4 C)  TempSrc: Oral  Oral Oral  SpO2: 100%  100% 98%  Weight:  74.8 kg    Height:        Intake/Output Summary (Last 24 hours) at 05/30/2019 0920 Last data filed at 05/30/2019 0834 Gross per 24 hour  Intake 2320 ml  Output 0 ml  Net 2320 ml     Wt Readings from Last 3 Encounters:  05/30/19 74.8 kg  03/02/19 77.2 kg  05/06/18 74.1 kg      Physical Exam General exam: Appears calm and comfortable  Respiratory system: Clear to auscultation. Respiratory effort normal. Cardiovascular system: S1 & S2 heard, RRR. No JVD, murmurs, rubs, gallops or clicks. No pedal edema. Gastrointestinal system:  Abdomen is nondistended, soft and nontender. No organomegaly or masses felt. Normal bowel sounds heard. Central nervous system: Alert and oriented. No focal neurological deficits. Extremities: Symmetric 5 x 5 power. Skin: Open ulcer on the dorsum medial part of great toe.  Intact blister on the base of the great toe.  Surrounding erythema, tenderness as well as bluish discoloration to the toe. Psychiatry: Judgement and insight appear poor. Mood & affect appropriate.           Data Reviewed:  I have personally reviewed following labs and imaging studies  Micro Results Recent Results (from the past 240 hour(s))  Culture, blood (routine x 2)     Status: None (Preliminary result)   Collection Time: 05/27/19 10:45 AM   Specimen: BLOOD  Result Value Ref Range Status   Specimen Description BLOOD LEFT ANTECUBITAL  Final   Special Requests   Final    BOTTLES DRAWN AEROBIC ONLY Blood Culture results may not be optimal due to an inadequate volume of blood received in culture bottles   Culture   Final    NO GROWTH 2 DAYS Performed at [facility] [address] [place], [place] [address]    Report Status PENDING  Incomplete  Culture, blood (routine x 2)     Status: None (Preliminary result)   Collection Time: 05/27/19 11:00 AM   Specimen: BLOOD RIGHT WRIST  Result Value Ref Range Status   Specimen Description   Final    BLOOD RIGHT WRIST Performed at [facility], [address] [place], [place] [address]    Special Requests   Final    BOTTLES DRAWN AEROBIC AND ANAEROBIC Blood Culture adequate volume Performed at [facility], [address] [place], [place]  [address]    Culture   Final    NO GROWTH 2 DAYS Performed at [facility], [place] [address], [place], [place] [address]    Report Status PENDING  Incomplete  SARS CORONAVIRUS 2 (TAT 6-24 HRS) Nasopharyngeal Nasopharyngeal Swab     Status: None   Collection Time: 05/27/19 11:08 AM    Specimen: Nasopharyngeal Swab  Result Value Ref Range Status   SARS Coronavirus 2 NEGATIVE NEGATIVE Final    Comment: (NOTE) SARS-CoV-2 target nucleic acids are NOT DETECTED. The SARS-CoV-2 RNA is generally detectable in upper and lower respiratory specimens during the acute phase of infection. Negative results do not preclude SARS-CoV-2 infection, do not rule out co-infections with other pathogens, and should not be used as the sole basis for treatment or other patient management decisions. Negative results must be combined with clinical observations, patient history, and epidemiological information. The expected result is Negative. Fact Sheet for Patients: SugarRoll.be Fact Sheet for Healthcare Providers: https://www.woods-mathews.com/ This test is not yet approved or cleared by the [place] FDA and  has been authorized for detection and/or diagnosis of SARS-CoV-2 by FDA under an Emergency Use Authorization (EUA). This EUA will remain  in effect (meaning this test can be used) for the duration of the COVID-19 declaration under Section 56 4(b)(1) of the Act, 21 U.S.C. section 360bbb-3(b)(1), unless the authorization is terminated or revoked sooner. Performed at [facility], [place] [address] [place], [place] [address]     Radiology Reports Dg Chest 1 View  Result Date: 05/27/2019 CLINICAL DATA:  Cellulitis. EXAM: CHEST  1 VIEW COMPARISON:  August 01, 2017. FINDINGS: The heart size and mediastinal contours are within normal limits. Both lungs are clear. Status post coronary bypass graft. The visualized skeletal structures are unremarkable. IMPRESSION: No active disease. Electronically Signed   By: Marijo Conception M.D.   On: 05/27/2019 11:34   Dg Foot Complete Right  Result Date: 05/27/2019 CLINICAL DATA:  Cellulitis.  Right foot swelling. EXAM: RIGHT FOOT COMPLETE - 3+ VIEW COMPARISON:  None. FINDINGS: There is no evidence of  fracture or dislocation. There is no evidence of arthropathy or other focal bone abnormality. No lytic destruction is seen to suggest osteomyelitis. Soft tissues are unremarkable. IMPRESSION: Negative. Electronically Signed   By: Marijo Conception M.D.   On: 05/27/2019 11:36    Lab Data:  CBC: Recent Labs  Lab 05/27/19 1059 05/27/19 1837 05/28/19 0530 05/30/19 0509  WBC 13.6* 11.6* 11.0* 9.7  NEUTROABS 10.2*  --   --   --   HGB 13.2 12.0* 11.9* 11.5*  HCT 40.0 36.6* 36.5* 35.1*  MCV 96.9 96.1 97.9 97.8  PLT 306 275 258 053   Basic Metabolic Panel: Recent Labs  Lab 05/27/19 1059 05/27/19 1837 05/28/19 0530 05/29/19 0624 05/29/19 1216 05/30/19 0509  NA 125*  --  129*  --  131* 133*  K 4.2  --  4.1  --  4.4 4.4  CL 95*  --  100  --  98 102  CO2 18*  --  19*  --  22 21*  GLUCOSE 265*  --  205*  --  210* 192*  BUN 10  --  14  --  16 15  CREATININE 0.88 1.02 1.02 1.12 1.23 1.13  CALCIUM 9.4  --  8.7*  --  9.3 9.4   GFR: Estimated Creatinine Clearance: 64.3 mL/min (by C-G formula based on SCr of 1.13 mg/dL). Liver Function Tests: No results  for input(s): AST, ALT, ALKPHOS, BILITOT, PROT, ALBUMIN in the last 168 hours. No results for input(s): LIPASE, AMYLASE in the last 168 hours. No results for input(s): AMMONIA in the last 168 hours. Coagulation Profile: No results for input(s): INR, PROTIME in the last 168 hours. Cardiac Enzymes: No results for input(s): CKTOTAL, CKMB, CKMBINDEX, TROPONINI in the last 168 hours. BNP (last 3 results) No results for input(s): PROBNP in the last 8760 hours. HbA1C: Recent Labs    05/27/19 1837  HGBA1C 8.5*   CBG: Recent Labs  Lab 05/29/19 0757 05/29/19 1130 05/29/19 1614 05/29/19 2103 05/30/19 0657  GLUCAP 174* 229* 139* 208* 209*   Lipid Profile: No results for input(s): CHOL, HDL, LDLCALC, TRIG, CHOLHDL, LDLDIRECT in the last 72 hours. Thyroid Function Tests: Recent Labs    05/27/19 1837  TSH 5.180*  FREET4 0.82    Anemia Panel: No results for input(s): VITAMINB12, FOLATE, FERRITIN, TIBC, IRON, RETICCTPCT in the last 72 hours. Urine analysis:    Component Value Date/Time   COLORURINE YELLOW 07/08/2013 0226   APPEARANCEUR CLEAR 07/08/2013 0226   LABSPEC 1.006 07/08/2013 0226   PHURINE 6.0 07/08/2013 0226   GLUCOSEU NEGATIVE 07/08/2013 0226   HGBUR NEGATIVE 07/08/2013 0226   BILIRUBINUR NEGATIVE 07/08/2013 0226   KETONESUR NEGATIVE 07/08/2013 0226   PROTEINUR NEGATIVE 07/08/2013 0226   UROBILINOGEN 0.2 07/08/2013 0226   NITRITE NEGATIVE 07/08/2013 0226   LEUKOCYTESUR NEGATIVE 07/08/2013 0226     Darliss Cheney M.D. [facility] 05/30/2019, 9:20 AM   Call night coverage person covering after 7pm

## 2019-05-31 ENCOUNTER — Encounter (HOSPITAL_COMMUNITY)
Admission: EM | Disposition: A | Discharge status: Skilled Nursing Facility | Payer: Self-pay | Source: Home / Self Care | Attending: Family Medicine

## 2019-05-31 ENCOUNTER — Encounter (HOSPITAL_COMMUNITY): Payer: Self-pay | Admitting: Vascular Surgery

## 2019-05-31 HISTORY — PX: ABDOMINAL AORTOGRAM W/LOWER EXTREMITY: CATH118223

## 2019-05-31 LAB — BASIC METABOLIC PANEL
Anion gap: 9 (ref 5–15)
BUN: 15 mg/dL (ref 8–23)
CO2: 22 mmol/L (ref 22–32)
Calcium: 9.3 mg/dL (ref 8.9–10.3)
Chloride: 103 mmol/L (ref 98–111)
Creatinine, Ser: 1.14 mg/dL (ref 0.61–1.24)
GFR calc Af Amer: >60 mL/min (ref >60)
GFR calc non Af Amer: >60 mL/min (ref >60)
Glucose, Bld: 139 mg/dL — ABNORMAL HIGH (ref 70–99)
Potassium: 4.8 mmol/L (ref 3.5–5.1)
Sodium: 134 mmol/L — ABNORMAL LOW (ref 135–145)

## 2019-05-31 LAB — CBC
HCT: 32.8 % — ABNORMAL LOW (ref 39.0–52.0)
Hemoglobin: 11 g/dL — ABNORMAL LOW (ref 13.0–17.0)
MCH: 32.3 pg (ref 26.0–34.0)
MCHC: 33.5 g/dL (ref 30.0–36.0)
MCV: 96.2 fL (ref 80.0–100.0)
Platelets: 268 10*3/uL (ref 150–400)
RBC: 3.41 MIL/uL — ABNORMAL LOW (ref 4.22–5.81)
RDW: 12.3 % (ref 11.5–15.5)
WBC: 7.9 10*3/uL (ref 4.0–10.5)
nRBC: 0 % (ref 0.0–0.2)

## 2019-05-31 LAB — GLUCOSE, CAPILLARY
Glucose-Capillary: 129 mg/dL — ABNORMAL HIGH (ref 70–99)
Glucose-Capillary: 131 mg/dL — ABNORMAL HIGH (ref 70–99)
Glucose-Capillary: 168 mg/dL — ABNORMAL HIGH (ref 70–99)
Glucose-Capillary: 98 mg/dL (ref 70–99)

## 2019-05-31 SURGERY — ABDOMINAL AORTOGRAM W/LOWER EXTREMITY
Anesthesia: LOCAL | Laterality: Bilateral

## 2019-05-31 MED ORDER — HEPARIN (PORCINE) IN NACL 1000-0.9 UT/500ML-% IV SOLN
INTRAVENOUS | Status: DC | PRN
  Administered 2019-05-31: 500 mL

## 2019-05-31 MED ORDER — MIDAZOLAM HCL 2 MG/2ML IJ SOLN
INTRAMUSCULAR | Status: DC | PRN
  Administered 2019-05-31: 1 mg via INTRAVENOUS

## 2019-05-31 MED ORDER — ONDANSETRON HCL 4 MG/2ML IJ SOLN
4.0000 mg | Freq: Four times a day (QID) | INTRAMUSCULAR | Status: DC | PRN
  Administered 2019-06-01 (×2): 4 mg via INTRAVENOUS
  Filled 2019-05-31 (×2): qty 2

## 2019-05-31 MED ORDER — FENTANYL CITRATE (PF) 100 MCG/2ML IJ SOLN
INTRAMUSCULAR | Status: AC
  Filled 2019-05-31: qty 2

## 2019-05-31 MED ORDER — FENTANYL CITRATE (PF) 100 MCG/2ML IJ SOLN
INTRAMUSCULAR | Status: DC | PRN
  Administered 2019-05-31: 50 ug via INTRAVENOUS

## 2019-05-31 MED ORDER — IODIXANOL 320 MG/ML IV SOLN
INTRAVENOUS | Status: DC | PRN
  Administered 2019-05-31: 105 mL

## 2019-05-31 MED ORDER — ACETAMINOPHEN 325 MG PO TABS: 650.0000 mg | ORAL_TABLET | ORAL | Status: DC | PRN

## 2019-05-31 MED ORDER — SODIUM CHLORIDE 0.9% FLUSH: 3.0000 mL | INTRAVENOUS | Status: DC | PRN

## 2019-05-31 MED ORDER — ENOXAPARIN SODIUM 40 MG/0.4ML ~~LOC~~ SOLN
40.0000 mg | SUBCUTANEOUS | Status: DC
  Administered 2019-05-31 – 2019-06-06 (×7): 40 mg via SUBCUTANEOUS
  Filled 2019-05-31 (×7): qty 0.4

## 2019-05-31 MED ORDER — SODIUM CHLORIDE 0.9 % IV SOLN
INTRAVENOUS | Status: DC
  Administered 2019-05-31: 06:00:00 via INTRAVENOUS

## 2019-05-31 MED ORDER — SODIUM CHLORIDE 0.9 % IV SOLN: 250.0000 mL | INTRAVENOUS | Status: DC | PRN

## 2019-05-31 MED ORDER — HYDRALAZINE HCL 20 MG/ML IJ SOLN: 5.0000 mg | INTRAMUSCULAR | Status: DC | PRN

## 2019-05-31 MED ORDER — MIDAZOLAM HCL 2 MG/2ML IJ SOLN
INTRAMUSCULAR | Status: AC
  Filled 2019-05-31: qty 2

## 2019-05-31 MED ORDER — HEPARIN (PORCINE) IN NACL 1000-0.9 UT/500ML-% IV SOLN
INTRAVENOUS | Status: AC
  Filled 2019-05-31: qty 1000

## 2019-05-31 MED ORDER — LIDOCAINE HCL (PF) 1 % IJ SOLN
INTRAMUSCULAR | Status: AC
  Filled 2019-05-31: qty 30

## 2019-05-31 MED ORDER — SODIUM CHLORIDE 0.9% FLUSH
3.0000 mL | Freq: Two times a day (BID) | INTRAVENOUS | Status: DC
  Administered 2019-05-31 (×2): 3 mL via INTRAVENOUS

## 2019-05-31 MED ORDER — SODIUM CHLORIDE 0.9 % IV SOLN
INTRAVENOUS | Status: AC
  Administered 2019-05-31: 13:00:00 via INTRAVENOUS

## 2019-05-31 MED ORDER — LIDOCAINE HCL (PF) 1 % IJ SOLN
INTRAMUSCULAR | Status: DC | PRN
  Administered 2019-05-31 (×2): 15 mL

## 2019-05-31 SURGICAL SUPPLY — 13 items
CATH ANGIO 5F BER2 65CM (CATHETERS) ×1 IMPLANT
CATH OMNI FLUSH 5F 65CM (CATHETERS) ×1 IMPLANT
DEVICE TORQUE .025-.038 (MISCELLANEOUS) ×1 IMPLANT
GLIDEWIRE ANGLED SS 035X260CM (WIRE) ×1 IMPLANT
KIT MICROPUNCTURE NIT STIFF (SHEATH) ×1 IMPLANT
KIT PV (KITS) ×2 IMPLANT
SHEATH PINNACLE 5F 10CM (SHEATH) ×2 IMPLANT
SHEATH PROBE COVER 6X72 (BAG) ×1 IMPLANT
SYR MEDRAD MARK V 150ML (SYRINGE) ×1 IMPLANT
TRANSDUCER W/STOPCOCK (MISCELLANEOUS) ×2 IMPLANT
TRAY PV CATH (CUSTOM PROCEDURE TRAY) ×2 IMPLANT
WIRE BENTSON .035X145CM (WIRE) ×1 IMPLANT
WIRE TORQFLEX AUST .018X40CM (WIRE) ×1 IMPLANT

## 2019-05-31 NOTE — Op Note (Signed)
    Patient name: Jason [name] MRN: [id number] DOB: 1950-08-20 Sex: male  05/31/2019 Pre-operative Diagnosis: Critical right lower extremity ischemia with great toe ulceration Post-operative diagnosis:  Same Surgeon:  Erlene Quan C. Donzetta Matters, MD Procedure Performed: 1.  Ultrasound-guided cannulation left common femoral artery 2.  Ultrasound-guided cannulation right common femoral artery 3.  Aortogram with bilateral lower extremity runoff 4.  Moderate sedation with fentanyl and Versed for   Indications: 68 year old male here with great toe ulceration on the right.  He has severely depressed ABIs bilaterally.  Has had previous intervention on the right common femoral artery after cardiac catheterization.  Has possibly had greater saphenous vein harvested on the right as well.  Findings: I cannot access from the left side because a dissection plane.  From the right side I was able to access.  The aorta and iliac segments are heavily diseased but there does appear to be nonflow-limiting stenosis to the both common femoral arteries.  On the right side which is the site of issue he possibly has a bypass to the profunda the SFA is flush occluded.  He reconstitutes above-knee popliteal.  Appears to have runoff via the anterior tibial and peroneal arteries.  On the left side he has a very diseased SFA.  Appears to occlude his tibioperoneal trunk and has runoff to the ankle via peroneal artery.  We will consider patient for right common femoral to popliteal artery bypass.  He will be vein mapped for bilateral greater saphenous veins.   Procedure:  The patient was identified in the holding area and taken to room 8.  The patient was then placed supine on the table and prepped and draped in the usual sterile fashion.  A time out was called.  Ultrasound was used to evaluate the left common femoral artery.  This was heavily diseased.  The cannulated 1 healthy area with micropuncture needle followed by wire and  sheath.  Images saved the permanent record.  The wire would not pass I did use fluoroscopy to get it to pass further.  I placed a micropuncture sheath.  I then used a stiff angled Glidewire.  Unfortunately was in a subintimal plane.  I placed a 5 [place] sheath 10 disease.  Catheter to reenter but could not.  I then used ultrasound to identify the common femoral artery on the right.  This also appeared to be heavily diseased there was scar tissue was possibly a bypass in place.  I cannulated this with direct ultrasound visualization images saved the permanent record.  I did get a Bentson wire to pass.  I then performed retrograde angiography with hand-injection to confirm I was intraluminal in the aorta.  I then placed a Omni catheter to the level of L1 performed aortogram followed bilateral lower extremity runoff with the above findings.  Patient will be considered for right common femoral to popliteal artery bypass.  We will vein map him.  He will likely need toe amputation as well.  Catheter was removed over wire.  Sheath will be pulled in postoperative holding.  He tolerated procedure well without immediate complication.   Contrast: 100cc  Brandon C. Donzetta Matters, MD [facility] Office: [phone] Pager: [phone]

## 2019-05-31 NOTE — Progress Notes (Signed)
51fr sheath removed from left groin intact with no bleeding or hematoma present. Manual pressure was applied x 59min. 4x4 and tegaderm dressing applied to site. Post sheath pull instructions provided; patient verbalized understanding. Call bell near. Will continue to monitor.Cindee Salt

## 2019-05-31 NOTE — Progress Notes (Signed)
  Progress Note    05/31/2019 9:58 AM * No surgery date entered *  Subjective: No overnight issues  Vitals:   05/31/19 0508 05/31/19 0847  BP: (!) 105/58 120/61  Pulse: 68 66  Resp: 16 18  Temp: 98.3 F (36.8 C) 97.8 F (36.6 C)  SpO2: 97% 98%    Physical Exam: Awake alert oriented Right foot with stable ischemic changes  CBC    Component Value Date/Time   WBC 7.9 05/31/2019 0616   RBC 3.41 (L) 05/31/2019 0616   HGB 11.0 (L) 05/31/2019 0616   HCT 32.8 (L) 05/31/2019 0616   PLT 268 05/31/2019 0616   MCV 96.2 05/31/2019 0616   MCV 96.7 12/23/2016 1549   MCH 32.3 05/31/2019 0616   MCHC 33.5 05/31/2019 0616   RDW 12.3 05/31/2019 0616   LYMPHSABS 2.3 05/27/2019 1059   MONOABS 0.8 05/27/2019 1059   EOSABS 0.1 05/27/2019 1059   BASOSABS 0.1 05/27/2019 1059    BMET    Component Value Date/Time   NA 134 (L) 05/31/2019 0616   NA 131 (L) 09/30/2017 1629   K 4.8 05/31/2019 0616   CL 103 05/31/2019 0616   CO2 22 05/31/2019 0616   GLUCOSE 139 (H) 05/31/2019 0616   BUN 15 05/31/2019 0616   BUN 13 09/30/2017 1629   CREATININE 1.14 05/31/2019 0616   CREATININE 1.14 07/12/2015 1220   CALCIUM 9.3 05/31/2019 0616   GFRNONAA >60 05/31/2019 0616   GFRNONAA 69 07/12/2015 1220   GFRAA >60 05/31/2019 0616   GFRAA 79 07/12/2015 1220    INR    Component Value Date/Time   INR 1.01 10/23/2016 0528     Intake/Output Summary (Last 24 hours) at 05/31/2019 0958 Last data filed at 05/31/2019 0813 Gross per 24 hour  Intake 1866.06 ml  Output 350 ml  Net 1516.06 ml     Assessment/plan:  68 y.o. male is here with ischemic changes right great toe.  Does not have previous endovascular intervention bilateral lower extremities but possibly had repair of right common femoral artery after cardiac cath about 15 years ago.  I have no record of this but he does have a scar in his groin to suggest this.  We will plan for aortogram bilateral lower extremity runoff.  Options will include  endovascular invention versus possible bypass.  I discussed this with the patient he agrees to proceed.   Katrese Shell C. Donzetta Matters, MD [facility] Office: [phone] Pager: [phone]  05/31/2019 9:58 AM

## 2019-05-31 NOTE — Progress Notes (Signed)
81fr Sheath removed intact with not bleeding or hematoma present. Manual pressure applied x 62min per Nelda Severe, RRT. 4x4 dressing applied to site. Post sheath pull instructions given; patient verbalized understanding. Will continue to monitor.Cindee Salt

## 2019-05-31 NOTE — Plan of Care (Signed)
  Problem: Education: Goal: Knowledge of General Education information will improve Description: Including pain rating scale, medication(s)/side effects and non-pharmacologic comfort measures Outcome: Progressing   Problem: Pain Managment: Goal: General experience of comfort will improve Outcome: Progressing   Problem: Skin Integrity: Goal: Risk for impaired skin integrity will decrease Outcome: Progressing   

## 2019-05-31 NOTE — Plan of Care (Signed)
  Problem: Education: Goal: Knowledge of General Education information will improve Description: Including pain rating scale, medication(s)/side effects and non-pharmacologic comfort measures 05/31/2019 0344 by Suzy Bouchard, RN Outcome: Progressing 05/31/2019 0342 by Suzy Bouchard, RN Outcome: Progressing   Problem: Pain Managment: Goal: General experience of comfort will improve Outcome: Progressing   Problem: Skin Integrity: Goal: Risk for impaired skin integrity will decrease Outcome: Progressing

## 2019-05-31 NOTE — Progress Notes (Signed)
[facility]                                                                              Patient Demographics  Jason [name], is a 68 y.o. male, DOB - January 19, 1951, RVI:[id number]  Admit date - 05/27/2019   Admitting Physician Georgette Shell, MD  Outpatient Primary MD for the patient is Patient, [name]  Outpatient specialists:   LOS - 4  days   Medical records reviewed and are as summarized below:    Chief Complaint  Patient presents with   Foot Pain       Brief summary   Jason [name] is a 68 y.o. male  presented [facility] with medical history of DM, not on medications for 1 month, b/l Carotid artery disease with R ICA stent, occluded L ICA stent,CVA, COPD, CAD s/p CABG in 2011, and and NSTEMI and DES to sVF- OM2 in 2018, HTN, GERD, nicotine abuse presented with about 4 days of right foot pain followed by a change in color of his 1st toe to black. He admits to fever, chills, nausea and vomiting. He reported subjective fevers/ chill about 1 wk ago. The foot then became red and painful and the toe then became purple and black. His pain is moderate and worse when walking, radiates up his leg. He has had nausea and occasional vomiting for 2 days. He has not been able to pick up his medications in at least 1 month as he lives in [place] and his pharmacy is in [place].  In ED sodium 125, CO2 18, WBCs 13.6.  Right foot x-ray showed no osteomyelitis or gas.  He was admitted under hospital service due to sepsis secondary to right foot cellulitis/gangrene of the first toe with history of PVD.  Blood culture negative to date.  He was started on Zosyn and vancomycin.  He was seen by vascular surgery and they recommended aortogram for which patient was transferred to [facility] on 05/29/2019.  Assessment & Plan    Principal Problem: Sepsis with right foot cellulitis, gangrene of the right first toe with history of PVD, right femoral  endarterectomy 2004 -Met sepsis criteria. Blood cultures no growth so far. Given underlying history of PVD, vascular surgery consulted. Patient was seen by Dr. Scot Dock,, recommended to transfer to [facility] for arteriogram and may need amputation of the right great toe after that.  He is scheduled to have aortogram today. -Continue IV vancomycin and Zosyn   Active Problems: Diabetes mellitus type 2, uncontrolled with hyperglycemia, noncompliant, with right foot cellulitis, gangrene -Patient reports that he ran out of his Glucophage over a month ago, used to be on insulin in the past -Hemoglobin A1c 8.5, Blood sugar controlled.  Continue 10 units of Lantus and SSI.  Hyponatremia, has also underlying chronic hyponatremia -Sodium 129 today, corrected sodium 131 with hyperglycemia, however has dehydration from vomiting -Calculated osmolality 274 -Continue IV fluids.  134 today.  History of coronary disease s/p CABG, stent -Continue Plavix, aspirin, metoprolol, statin  Prolonged QTC, trigeminy -QTC 532, EKG changes, extensive cardiac history, continue telemetry, repeat EKG tomorrow  morning.  Hypothyroidism -TSH 5.18, free T4 0.8, ran out of Synthroid, resumed Synthroid 50 mcg.  Depression Ran out of Zoloft, resumed  -Check BMET  for sodium trend  Essential hypertension Blood pressure fairly stable.,  Continue to hold Imdur, and continue decreased dose of Toprol-XL to 25 mg daily  Nicotine use Patient counseled, continue nicotine patch  Code Status: Partial (yes to everything but no intubation) DVT Prophylaxis:  Lovenox  Family Communication: Discussed all imaging results, lab results, explained to the patient  Disposition Plan: To be determined based on further vascular work-up.  Time Spent in minutes 24 minutes  Procedures:  None  Consultants:   Vascular surgery, Dr. Scot Dock  Antimicrobials:   Anti-infectives (From admission, onward)   Start     Dose/Rate Route  Frequency Ordered Stop   05/29/19 2200  vancomycin (VANCOCIN) 1,500 mg in sodium chloride 0.9 % 500 mL IVPB     1,500 mg 250 mL/hr over 120 Minutes Intravenous Every 24 hours 05/29/19 1419     05/29/19 2200  metroNIDAZOLE (FLAGYL) IVPB 500 mg     500 mg 100 mL/hr over 60 Minutes Intravenous Every 8 hours 05/29/19 1427     05/29/19 2200  ceFEPIme (MAXIPIME) 2 g in sodium chloride 0.9 % 100 mL IVPB     2 g 200 mL/hr over 30 Minutes Intravenous Every 8 hours 05/29/19 1428     05/28/19 2200  vancomycin (VANCOCIN) 1,750 mg in sodium chloride 0.9 % 500 mL IVPB  Status:  Discontinued     1,750 mg 250 mL/hr over 120 Minutes Intravenous Every 24 hours 05/28/19 0610 05/29/19 1419   05/28/19 1000  vancomycin (VANCOCIN) 1,750 mg in sodium chloride 0.9 % 500 mL IVPB  Status:  Discontinued     1,750 mg 250 mL/hr over 120 Minutes Intravenous Every 24 hours 05/27/19 1949 05/28/19 0610   05/27/19 2000  piperacillin-tazobactam (ZOSYN) IVPB 3.375 g  Status:  Discontinued     3.375 g 12.5 mL/hr over 240 Minutes Intravenous Every 8 hours 05/27/19 1500 05/27/19 1941   05/27/19 2000  piperacillin-tazobactam (ZOSYN) IVPB 3.375 g  Status:  Discontinued     3.375 g 12.5 mL/hr over 240 Minutes Intravenous Every 8 hours 05/27/19 1941 05/29/19 1427   05/27/19 1800  piperacillin-tazobactam (ZOSYN) IVPB 3.375 g  Status:  Discontinued     3.375 g 100 mL/hr over 30 Minutes Intravenous  Once 05/27/19 1748 05/27/19 1941   05/27/19 1800  vancomycin (VANCOCIN) 1,500 mg in sodium chloride 0.9 % 500 mL IVPB     1,500 mg 250 mL/hr over 120 Minutes Intravenous  Once 05/27/19 1748 05/28/19 0543   05/27/19 1715  piperacillin-tazobactam (ZOSYN) IVPB 3.375 g  Status:  Discontinued     3.375 g 100 mL/hr over 30 Minutes Intravenous Every 6 hours 05/27/19 1212 05/27/19 1500   05/27/19 1215  piperacillin-tazobactam (ZOSYN) IVPB 3.375 g  Status:  Discontinued     3.375 g 100 mL/hr over 30 Minutes Intravenous Every 6 hours 05/27/19  1208 05/27/19 1212   05/27/19 1100  piperacillin-tazobactam (ZOSYN) IVPB 3.375 g     3.375 g 100 mL/hr over 30 Minutes Intravenous  Once 05/27/19 1055 05/27/19 1155         Medications  Scheduled Meds:  aspirin EC  81 mg Oral Daily   clopidogrel  75 mg Oral Daily   enoxaparin (LOVENOX) injection  40 mg Subcutaneous Q24H   insulin aspart  0-15 Units Subcutaneous TID WC   insulin aspart  0-5 Units Subcutaneous QHS   insulin glargine  15 Units Subcutaneous QHS   levothyroxine  50 mcg Oral Daily   metoprolol succinate  25 mg Oral Daily   nicotine  21 mg Transdermal Q24H   rosuvastatin  10 mg Oral Daily   sertraline  250 mg Oral QHS   Continuous Infusions:  sodium chloride Stopped (05/27/19 1155)   sodium chloride 100 mL/hr at 05/31/19 0534   ceFEPime (MAXIPIME) IV 2 g (05/31/19 0536)   metronidazole 500 mg (05/31/19 3013)   vancomycin 1,500 mg (05/31/19 0028)   PRN Meds:.sodium chloride, acetaminophen **OR** acetaminophen, albuterol, famotidine, HYDROcodone-acetaminophen, oxyCODONE-acetaminophen      Subjective:   Patient seen and examined.  No complaints other than right foot pain which is improving.  Objective:   Vitals:   05/30/19 0458 05/30/19 0848 05/30/19 1621 05/31/19 0508  BP: 130/80 (!) 123/56 129/66 (!) 105/58  Pulse: 73 67 73 68  Resp: _0 Temp: 98 F (36.7 C) 97.6 F (36.4 C) 98 F (36.7 C) 98.3 F (36.8 C)  TempSrc: Oral Oral Oral Oral  SpO2: 100% 98% 99% 97%  Weight:      Height:        Intake/Output Summary (Last 24 hours) at 05/31/2019 0827 Last data filed at 05/31/2019 0813 Gross per 24 hour  Intake 2266.06 ml  Output 350 ml  Net 1916.06 ml     Wt Readings from Last 3 Encounters:  05/30/19 74.8 kg  03/02/19 77.2 kg  05/06/18 74.1 kg      Physical Exam General exam: Appears calm and comfortable  Respiratory system: Clear to auscultation. Respiratory effort normal. Cardiovascular system: S1 & S2 heard,  RRR. No JVD, murmurs, rubs, gallops or clicks. No pedal edema. Gastrointestinal system: Abdomen is nondistended, soft and nontender. No organomegaly or masses felt. Normal bowel sounds heard. Central nervous system: Alert and oriented. No focal neurological deficits. Extremities: Symmetric 5 x 5 power. Skin: Dressing on right foot.  Direct visualization defer to a specialist. Psychiatry: Judgement and insight appear normal. Mood & affect appropriate.         Data Reviewed:  I have personally reviewed following labs and imaging studies  Micro Results Recent Results (from the past 240 hour(s))  Culture, blood (routine x 2)     Status: None (Preliminary result)   Collection Time: 05/27/19 10:45 AM   Specimen: BLOOD  Result Value Ref Range Status   Specimen Description BLOOD LEFT ANTECUBITAL  Final   Special Requests   Final    BOTTLES DRAWN AEROBIC ONLY Blood Culture results may not be optimal due to an inadequate volume of blood received in culture bottles   Culture   Final    NO GROWTH 3 DAYS Performed at [facility], [place] [address], [place], [place] [address]    Report Status PENDING  Incomplete  Culture, blood (routine x 2)     Status: None (Preliminary result)   Collection Time: 05/27/19 11:00 AM   Specimen: BLOOD RIGHT WRIST  Result Value Ref Range Status   Specimen Description   Final    BLOOD RIGHT WRIST Performed at [facility], [address] [place], [place] [address]    Special Requests   Final    BOTTLES DRAWN AEROBIC AND ANAEROBIC Blood Culture adequate volume Performed at [facility], [place] [place], [place] [address]    Culture   Final    NO GROWTH  3 DAYS Performed at [facility], [place] [address], [place], [place] [address]    Report Status PENDING  Incomplete  SARS CORONAVIRUS 2 (TAT 6-24 HRS) Nasopharyngeal Nasopharyngeal Swab     Status: None   Collection Time: 05/27/19 11:08 AM   Specimen: Nasopharyngeal  Swab  Result Value Ref Range Status   SARS Coronavirus 2 NEGATIVE NEGATIVE Final    Comment: (NOTE) SARS-CoV-2 target nucleic acids are NOT DETECTED. The SARS-CoV-2 RNA is generally detectable in upper and lower respiratory specimens during the acute phase of infection. Negative results do not preclude SARS-CoV-2 infection, do not rule out co-infections with other pathogens, and should not be used as the sole basis for treatment or other patient management decisions. Negative results must be combined with clinical observations, patient history, and epidemiological information. The expected result is Negative. Fact Sheet for Patients: SugarRoll.be Fact Sheet for Healthcare Providers: https://www.woods-mathews.com/ This test is not yet approved or cleared by the [place] FDA and  has been authorized for detection and/or diagnosis of SARS-CoV-2 by FDA under an Emergency Use Authorization (EUA). This EUA will remain  in effect (meaning this test can be used) for the duration of the COVID-19 declaration under Section 56 4(b)(1) of the Act, 21 U.S.C. section 360bbb-3(b)(1), unless the authorization is terminated or revoked sooner. Performed at [facility], [place] [address], [place], [place] [address]     Radiology Reports Dg Chest 1 View  Result Date: 05/27/2019 CLINICAL DATA:  Cellulitis. EXAM: CHEST  1 VIEW COMPARISON:  August 01, 2017. FINDINGS: The heart size and mediastinal contours are within normal limits. Both lungs are clear. Status post coronary bypass graft. The visualized skeletal structures are unremarkable. IMPRESSION: No active disease. Electronically Signed   By: Marijo Conception M.D.   On: 05/27/2019 11:34   Dg Foot Complete Right  Result Date: 05/27/2019 CLINICAL DATA:  Cellulitis.  Right foot swelling. EXAM: RIGHT FOOT COMPLETE - 3+ VIEW COMPARISON:  None. FINDINGS: There is no evidence of fracture or dislocation.  There is no evidence of arthropathy or other focal bone abnormality. No lytic destruction is seen to suggest osteomyelitis. Soft tissues are unremarkable. IMPRESSION: Negative. Electronically Signed   By: Marijo Conception M.D.   On: 05/27/2019 11:36   Vas [place] Burnard Bunting With/wo Tbi  Result Date: 05/30/2019 LOWER EXTREMITY DOPPLER STUDY Indications: Ulceration, gangrene, and peripheral artery disease. High Risk Factors: Hypertension, hyperlipidemia, Diabetes, current smoker,                    coronary artery disease.  Vascular Interventions: Right ICA stent 03/28/15, CABG 2011. Comparison Study: No prior study on file for comparison Performing Technologist: Sharion Dove RVS  Examination Guidelines: A complete evaluation includes at minimum, Doppler waveform signals and systolic blood pressure reading at the level of bilateral brachial, anterior tibial, and posterior tibial arteries, when vessel segments are accessible. Bilateral testing is considered an integral part of a complete examination. Photoelectric Plethysmograph (PPG) waveforms and toe systolic pressure readings are included as required and additional duplex testing as needed. Limited examinations for reoccurring indications may be performed as noted.  ABI Findings: +---------+------------------+-----+-------------------+------------------+  Right     Rt Pressure (mmHg) Index Waveform            Comment             +---------+------------------+-----+-------------------+------------------+  Brachial  135  triphasic                               +---------+------------------+-----+-------------------+------------------+  PTA       54                 0.40  dampened monophasic                     +---------+------------------+-----+-------------------+------------------+  DP        40                 0.30  monophasic                              +---------+------------------+-----+-------------------+------------------+  Great Toe                                               bandage/ulceration  +---------+------------------+-----+-------------------+------------------+ +---------+------------------+-----+-------------------+-------+  Left      Lt Pressure (mmHg) Index Waveform            Comment  +---------+------------------+-----+-------------------+-------+  Brachial  129                      triphasic                    +---------+------------------+-----+-------------------+-------+  PTA       38                 0.28  dampened monophasic          +---------+------------------+-----+-------------------+-------+  DP        33                 0.24  dampened monophasic          +---------+------------------+-----+-------------------+-------+  Great Toe 0                  0.00                      absent   +---------+------------------+-----+-------------------+-------+ +-------+-----------+-----------+------------+------------+  ABI/TBI Today's ABI Today's TBI Previous ABI Previous TBI  +-------+-----------+-----------+------------+------------+  Right   0.4         wound                                  +-------+-----------+-----------+------------+------------+  Left    0.2         absent                                 +-------+-----------+-----------+------------+------------+  Summary: Right: Resting right ankle-brachial index indicates severe right lower extremity arterial disease. Left: Resting left ankle-brachial index indicates critical left limb ischemia. The left toe-brachial index is abnormal.  *See table(s) above for measurements and observations.  Electronically signed by Servando Snare MD on 05/30/2019 at 5:01:14 PM.    Final     Lab Data:  CBC: Recent Labs  Lab 05/27/19 1059 05/27/19 1837 05/28/19 0530 05/30/19 0509 05/31/19 0616  WBC 13.6* 11.6* 11.0* 9.7 7.9  NEUTROABS 10.2*  --   --   --   --  HGB 13.2 12.0* 11.9* 11.5* 11.0*  HCT 40.0 36.6* 36.5* 35.1* 32.8*  MCV 96.9 96.1 97.9 97.8 96.2  PLT 306 275 258 298 784    Basic Metabolic Panel: Recent Labs  Lab 05/27/19 1059  05/28/19 0530 05/29/19 0624 05/29/19 1216 05/30/19 0509 05/31/19 0616  NA 125*  --  129*  --  131* 133* 134*  K 4.2  --  4.1  --  4.4 4.4 4.8  CL 95*  --  100  --  98 102 103  CO2 18*  --  19*  --  22 21* 22  GLUCOSE 265*  --  205*  --  210* 192* 139*  BUN 10  --  14  --  _0 CREATININE 0.88   < > 1.02 1.12 1.23 1.13 1.14  CALCIUM 9.4  --  8.7*  --  9.3 9.4 9.3   < > = values in this interval not displayed.   GFR: Estimated Creatinine Clearance: 63.7 mL/min (by C-G formula based on SCr of 1.14 mg/dL). Liver Function Tests: No results for input(s): AST, ALT, ALKPHOS, BILITOT, PROT, ALBUMIN in the last 168 hours. No results for input(s): LIPASE, AMYLASE in the last 168 hours. No results for input(s): AMMONIA in the last 168 hours. Coagulation Profile: No results for input(s): INR, PROTIME in the last 168 hours. Cardiac Enzymes: No results for input(s): CKTOTAL, CKMB, CKMBINDEX, TROPONINI in the last 168 hours. BNP (last 3 results) No results for input(s): PROBNP in the last 8760 hours. HbA1C: No results for input(s): HGBA1C in the last 72 hours. CBG: Recent Labs  Lab 05/30/19 0657 05/30/19 1128 05/30/19 1629 05/30/19 2103 05/31/19 0654  GLUCAP 209* 140* 97 139* 129*   Lipid Profile: No results for input(s): CHOL, HDL, LDLCALC, TRIG, CHOLHDL, LDLDIRECT in the last 72 hours. Thyroid Function Tests: No results for input(s): TSH, T4TOTAL, FREET4, T3FREE, THYROIDAB in the last 72 hours. Anemia Panel: No results for input(s): VITAMINB12, FOLATE, FERRITIN, TIBC, IRON, RETICCTPCT in the last 72 hours. Urine analysis:    Component Value Date/Time   COLORURINE YELLOW 07/08/2013 0226   APPEARANCEUR CLEAR 07/08/2013 0226   LABSPEC 1.006 07/08/2013 0226   PHURINE 6.0 07/08/2013 0226   GLUCOSEU NEGATIVE 07/08/2013 0226   HGBUR NEGATIVE 07/08/2013 0226   BILIRUBINUR NEGATIVE 07/08/2013 0226   KETONESUR NEGATIVE  07/08/2013 0226   PROTEINUR NEGATIVE 07/08/2013 0226   UROBILINOGEN 0.2 07/08/2013 0226   NITRITE NEGATIVE 07/08/2013 0226   LEUKOCYTESUR NEGATIVE 07/08/2013 0226     Darliss Cheney M.D. [facility] 05/31/2019, 8:27 AM   Call night coverage person covering after 7pm

## 2019-06-01 ENCOUNTER — Inpatient Hospital Stay (HOSPITAL_COMMUNITY): Payer: Medicare Other

## 2019-06-01 DIAGNOSIS — Z01810 Encounter for preprocedural cardiovascular examination: Secondary | ICD-10-CM

## 2019-06-01 LAB — CULTURE, BLOOD (ROUTINE X 2)
Culture: NO GROWTH
Culture: NO GROWTH
Special Requests: ADEQUATE

## 2019-06-01 LAB — BASIC METABOLIC PANEL
Anion gap: 9 (ref 5–15)
BUN: 14 mg/dL (ref 8–23)
CO2: 21 mmol/L — ABNORMAL LOW (ref 22–32)
Calcium: 9 mg/dL (ref 8.9–10.3)
Chloride: 102 mmol/L (ref 98–111)
Creatinine, Ser: 0.9 mg/dL (ref 0.61–1.24)
GFR calc Af Amer: >60 mL/min (ref >60)
GFR calc non Af Amer: >60 mL/min (ref >60)
Glucose, Bld: 121 mg/dL — ABNORMAL HIGH (ref 70–99)
Potassium: 4.4 mmol/L (ref 3.5–5.1)
Sodium: 132 mmol/L — ABNORMAL LOW (ref 135–145)

## 2019-06-01 LAB — CBC
HCT: 28.5 % — ABNORMAL LOW (ref 39.0–52.0)
Hemoglobin: 9.4 g/dL — ABNORMAL LOW (ref 13.0–17.0)
MCH: 31.8 pg (ref 26.0–34.0)
MCHC: 33 g/dL (ref 30.0–36.0)
MCV: 96.3 fL (ref 80.0–100.0)
Platelets: 268 10*3/uL (ref 150–400)
RBC: 2.96 MIL/uL — ABNORMAL LOW (ref 4.22–5.81)
RDW: 12.5 % (ref 11.5–15.5)
WBC: 10.6 10*3/uL — ABNORMAL HIGH (ref 4.0–10.5)
nRBC: 0 % (ref 0.0–0.2)

## 2019-06-01 LAB — GLUCOSE, CAPILLARY
Glucose-Capillary: 134 mg/dL — ABNORMAL HIGH (ref 70–99)
Glucose-Capillary: 219 mg/dL — ABNORMAL HIGH (ref 70–99)
Glucose-Capillary: 60 mg/dL — ABNORMAL LOW (ref 70–99)
Glucose-Capillary: 140 mg/dL — ABNORMAL HIGH (ref 70–99)
Glucose-Capillary: 140 mg/dL — ABNORMAL HIGH (ref 70–99)
Glucose-Capillary: 97 mg/dL (ref 70–99)

## 2019-06-01 NOTE — Progress Notes (Addendum)
  Progress Note    06/01/2019 8:14 AM 1 Day Post-Op  Subjective:  Denies rest pain BLE   Vitals:   05/31/19 1956 06/01/19 0414  BP: (!) 115/48 (!) 127/56  Pulse: 69 72  Resp: (!) 25 18  Temp: 97.6 F (36.4 C) 97.9 F (36.6 C)  SpO2: 100% 98%   Physical Exam: Lungs:  Non labored Incisions:  L groin soft, no palpable hematoma Extremities:  R AT by doppler; L peroneal by doppler Neurologic: A&O  CBC    Component Value Date/Time   WBC 10.6 (H) 06/01/2019 0154   RBC 2.96 (L) 06/01/2019 0154   HGB 9.4 (L) 06/01/2019 0154   HCT 28.5 (L) 06/01/2019 0154   PLT 268 06/01/2019 0154   MCV 96.3 06/01/2019 0154   MCV 96.7 12/23/2016 1549   MCH 31.8 06/01/2019 0154   MCHC 33.0 06/01/2019 0154   RDW 12.5 06/01/2019 0154   LYMPHSABS 2.3 05/27/2019 1059   MONOABS 0.8 05/27/2019 1059   EOSABS 0.1 05/27/2019 1059   BASOSABS 0.1 05/27/2019 1059    BMET    Component Value Date/Time   NA 132 (L) 06/01/2019 0154   NA 131 (L) 09/30/2017 1629   K 4.4 06/01/2019 0154   CL 102 06/01/2019 0154   CO2 21 (L) 06/01/2019 0154   GLUCOSE 121 (H) 06/01/2019 0154   BUN 14 06/01/2019 0154   BUN 13 09/30/2017 1629   CREATININE 0.90 06/01/2019 0154   CREATININE 1.14 07/12/2015 1220   CALCIUM 9.0 06/01/2019 0154   GFRNONAA >60 06/01/2019 0154   GFRNONAA 69 07/12/2015 1220   GFRAA >60 06/01/2019 0154   GFRAA 79 07/12/2015 1220    INR    Component Value Date/Time   INR 1.01 10/23/2016 0528     Intake/Output Summary (Last 24 hours) at 06/01/2019 0814 Last data filed at 05/31/2019 1919 Gross per 24 hour  Intake 3 ml  Output 1000 ml  Net -997 ml     Assessment/Plan:  68 y.o. male is s/p diagnostic RLE arteriogram 1 Day Post-Op   L groin cath site without palpable hematoma Plan is for R femoral to popliteal bypass Vein mapping today Consent NPO past midnight    Dagoberto Ligas, PA-C [facility] [phone] 06/01/2019 8:14 AM   I have independently  interviewed and examined patient and agree with PA assessment and plan above.   Bhavana Kady C. Donzetta Matters, MD [facility] [place] Office: [phone] Pager: [phone]

## 2019-06-01 NOTE — Progress Notes (Signed)
[facility]                                                                              Patient Demographics  Jason [name], is a 68 y.o. male, DOB - [birth year], YEM:[id number]  Admit date - 05/27/2019   Admitting Physician Georgette Shell, MD  Outpatient Primary MD for the patient is Patient, [name]  Outpatient specialists:   LOS - 5  days   Medical records reviewed and are as summarized below:    Chief Complaint  Patient presents with   Foot Pain       Brief summary   Jason [name] is a 68 y.o. male  presented from [facility] [facility] with medical history of DM, not on medications for 1 month, b/l Carotid artery disease with R ICA stent, occluded L ICA stent,CVA, COPD, CAD s/p CABG in 2011, and and NSTEMI and DES to sVF- OM2 in 2018, HTN, GERD, nicotine abuse presented with about 4 days of right foot pain followed by a change in color of his 1st toe to black. He admits to fever, chills, nausea and vomiting. He reported subjective fevers/ chill about 1 wk ago. The foot then became red and painful and the toe then became purple and black. His pain is moderate and worse when walking, radiates up his leg. He has had nausea and occasional vomiting for 2 days. He has not been able to pick up his medications in at least 1 month as he lives in [place] and his pharmacy is in [place].  In ED sodium 125, CO2 18, WBCs 13.6.  Right foot x-ray showed no osteomyelitis or gas.  He was admitted under hospital service due to sepsis secondary to right foot cellulitis/gangrene of the first toe with history of PVD.  Blood culture negative to date.  He was started on Zosyn and vancomycin.  He was seen by vascular surgery and they recommended aortogram for which patient was transferred to [facility] on 05/29/2019.  Underwent ultrasound-guided cannulation of the left and right common femoral artery, aortogram with bilateral lower extremity runoff on 05/31/2019.  Findings  below per vascular surgeon note "The aorta and iliac segments are heavily diseased but there does appear to be nonflow-limiting stenosis to the both common femoral arteries.  On the right side which is the site of issue he possibly has a bypass to the profunda the SFA is flush occluded.  He reconstitutes above-knee popliteal.  Appears to have runoff via the anterior tibial and peroneal arteries.  On the left side he has a very diseased SFA.  Appears to occlude his tibioperoneal trunk and has runoff to the ankle via peroneal artery"  Assessment & Plan    Principal Problem: Sepsis with right foot cellulitis, gangrene of the right first toe with history of PVD, right femoral endarterectomy 2004 -Met sepsis criteria. Blood cultures no growth so far. Given underlying history of PVD, vascular surgery consulted. Patient was seen by Dr. Scot Dock,, recommended to transfer to [facility] for arteriogram and may need amputation of the right great toe after that.  He underwent aortogram which showed  severe multivessel peripheral arterial disease.  Plan for right common femoral to popliteal artery bypass tomorrow.  -Continue IV vancomycin and Zosyn   Active Problems: Diabetes mellitus type 2, uncontrolled with hyperglycemia, noncompliant, with right foot cellulitis, gangrene -Patient reports that he ran out of his Glucophage over a month ago, used to be on insulin in the past -Hemoglobin A1c 8.5, Blood sugar controlled. Continue 10 units of Lantus and SSI.  Hyponatremia, has also underlying chronic hyponatremia -Sodium 129 today, corrected sodium 131 with hyperglycemia, however has dehydration from vomiting -Calculated osmolality 274 -Continue IV fluids.  134 today.  History of coronary disease s/p CABG, stent -Continue Plavix, aspirin, metoprolol, statin  Prolonged QTC, trigeminy -QTC 532, EKG changes, extensive cardiac history, continue telemetry, repeat EKG on 06/01/2019 shows QTc of  506.  Hypothyroidism -TSH 5.18, free T4 0.8, ran out of Synthroid, resumed Synthroid 50 mcg.  Depression Ran out of Zoloft, resumed.  Monitor sodium closely.  Essential hypertension Blood pressure fairly stable.,  Continue to hold Imdur, and continue decreased dose of Toprol-XL to 25 mg daily  Nicotine use Patient counseled, continue nicotine patch  Code Status: Partial (yes to everything but no intubation) DVT Prophylaxis:  Lovenox  Family Communication: Discussed all imaging results, lab results, explained to the patient  Disposition Plan: To be determined based on further vascular work-up and will be discharged once cleared by vascular surgery.  Plan for bypass right lower extremity tomorrow.  Time Spent in minutes 24 minutes  Procedures:  None  Consultants:   Vascular surgery, Dr. Scot Dock  Antimicrobials:   Anti-infectives (From admission, onward)   Start     Dose/Rate Route Frequency Ordered Stop   05/29/19 2200  vancomycin (VANCOCIN) 1,500 mg in sodium chloride 0.9 % 500 mL IVPB     1,500 mg 250 mL/hr over 120 Minutes Intravenous Every 24 hours 05/29/19 1419     05/29/19 2200  metroNIDAZOLE (FLAGYL) IVPB 500 mg     500 mg 100 mL/hr over 60 Minutes Intravenous Every 8 hours 05/29/19 1427     05/29/19 2200  ceFEPIme (MAXIPIME) 2 g in sodium chloride 0.9 % 100 mL IVPB     2 g 200 mL/hr over 30 Minutes Intravenous Every 8 hours 05/29/19 1428     05/28/19 2200  vancomycin (VANCOCIN) 1,750 mg in sodium chloride 0.9 % 500 mL IVPB  Status:  Discontinued     1,750 mg 250 mL/hr over 120 Minutes Intravenous Every 24 hours 05/28/19 0610 05/29/19 1419   05/28/19 1000  vancomycin (VANCOCIN) 1,750 mg in sodium chloride 0.9 % 500 mL IVPB  Status:  Discontinued     1,750 mg 250 mL/hr over 120 Minutes Intravenous Every 24 hours 05/27/19 1949 05/28/19 0610   05/27/19 2000  piperacillin-tazobactam (ZOSYN) IVPB 3.375 g  Status:  Discontinued     3.375 g 12.5 mL/hr over 240 Minutes  Intravenous Every 8 hours 05/27/19 1500 05/27/19 1941   05/27/19 2000  piperacillin-tazobactam (ZOSYN) IVPB 3.375 g  Status:  Discontinued     3.375 g 12.5 mL/hr over 240 Minutes Intravenous Every 8 hours 05/27/19 1941 05/29/19 1427   05/27/19 1800  piperacillin-tazobactam (ZOSYN) IVPB 3.375 g  Status:  Discontinued     3.375 g 100 mL/hr over 30 Minutes Intravenous  Once 05/27/19 1748 05/27/19 1941   05/27/19 1800  vancomycin (VANCOCIN) 1,500 mg in sodium chloride 0.9 % 500 mL IVPB     1,500 mg 250 mL/hr over 120 Minutes Intravenous  Once 05/27/19 1748  05/28/19 0543   05/27/19 1715  piperacillin-tazobactam (ZOSYN) IVPB 3.375 g  Status:  Discontinued     3.375 g 100 mL/hr over 30 Minutes Intravenous Every 6 hours 05/27/19 1212 05/27/19 1500   05/27/19 1215  piperacillin-tazobactam (ZOSYN) IVPB 3.375 g  Status:  Discontinued     3.375 g 100 mL/hr over 30 Minutes Intravenous Every 6 hours 05/27/19 1208 05/27/19 1212   05/27/19 1100  piperacillin-tazobactam (ZOSYN) IVPB 3.375 g     3.375 g 100 mL/hr over 30 Minutes Intravenous  Once 05/27/19 1055 05/27/19 1155         Medications  Scheduled Meds:  aspirin EC  81 mg Oral Daily   clopidogrel  75 mg Oral Daily   enoxaparin (LOVENOX) injection  40 mg Subcutaneous Q24H   insulin aspart  0-15 Units Subcutaneous TID WC   insulin aspart  0-5 Units Subcutaneous QHS   insulin glargine  15 Units Subcutaneous QHS   levothyroxine  50 mcg Oral Daily   metoprolol succinate  25 mg Oral Daily   nicotine  21 mg Transdermal Q24H   rosuvastatin  10 mg Oral Daily   sertraline  250 mg Oral QHS   sodium chloride flush  3 mL Intravenous Q12H   Continuous Infusions:  sodium chloride Stopped (05/27/19 1155)   sodium chloride 100 mL/hr at 05/31/19 0534   sodium chloride     ceFEPime (MAXIPIME) IV 2 g (06/01/19 0411)   metronidazole 500 mg (06/01/19 0533)   vancomycin 1,500 mg (06/01/19 0034)   PRN Meds:.sodium chloride, sodium  chloride, acetaminophen **OR** acetaminophen, albuterol, famotidine, hydrALAZINE, HYDROcodone-acetaminophen, ondansetron (ZOFRAN) IV, oxyCODONE-acetaminophen, sodium chloride flush      Subjective:   Seen and examined.  Continues to complain of right foot pain.  Worse than yesterday.  No other complaint.  Objective:   Vitals:   05/31/19 1700 05/31/19 1900 05/31/19 1956 06/01/19 0414  BP:  98/70 (!) 115/48 (!) 127/56  Pulse: 79  69 72  Resp: 17 (!) 23 (!) 25 18  Temp:   97.6 F (36.4 C) 97.9 F (36.6 C)  TempSrc:   Oral Oral  SpO2: 94%  100% 98%  Weight:      Height:        Intake/Output Summary (Last 24 hours) at 06/01/2019 0742 Last data filed at 05/31/2019 1919 Gross per 24 hour  Intake 3 ml  Output 1000 ml  Net -997 ml     Wt Readings from Last 3 Encounters:  05/30/19 74.8 kg  03/02/19 77.2 kg  05/06/18 74.1 kg   Physical Exam General exam: Appears calm and comfortable  Respiratory system: Clear to auscultation. Respiratory effort normal. Cardiovascular system: S1 & S2 heard, RRR. No JVD, murmurs, rubs, gallops or clicks. No pedal edema. Gastrointestinal system: Abdomen is nondistended, soft and nontender. No organomegaly or masses felt. Normal bowel sounds heard. Central nervous system: Alert and oriented. No focal neurological deficits. Extremities: Symmetric 5 x 5 power. Skin: Right foot has dressing.  Direct visualization defer to vascular surgery. Psychiatry: Judgement and insight appear normal. Mood & affect appropriate.         Data Reviewed:  I have personally reviewed following labs and imaging studies  Micro Results Recent Results (from the past 240 hour(s))  Culture, blood (routine x 2)     Status: None (Preliminary result)   Collection Time: 05/27/19 10:45 AM   Specimen: BLOOD  Result Value Ref Range Status   Specimen Description BLOOD LEFT ANTECUBITAL  Final  Special Requests   Final    BOTTLES DRAWN AEROBIC ONLY Blood Culture results  may not be optimal due to an inadequate volume of blood received in culture bottles   Culture   Final    NO GROWTH 4 DAYS Performed at [facility], [place] [address] [place], [place] [address]    Report Status PENDING  Incomplete  Culture, blood (routine x 2)     Status: None (Preliminary result)   Collection Time: 05/27/19 11:00 AM   Specimen: BLOOD RIGHT WRIST  Result Value Ref Range Status   Specimen Description   Final    BLOOD RIGHT WRIST Performed at [facility], [address] [place], [place] [address]    Special Requests   Final    BOTTLES DRAWN AEROBIC AND ANAEROBIC Blood Culture adequate volume Performed at [facility], [address] [place], [place] [address]    Culture   Final    NO GROWTH 4 DAYS Performed at [facility], [name] [address] [place], [place] [address]    Report Status PENDING  Incomplete  SARS CORONAVIRUS 2 (TAT 6-24 HRS) Nasopharyngeal Nasopharyngeal Swab     Status: None   Collection Time: 05/27/19 11:08 AM   Specimen: Nasopharyngeal Swab  Result Value Ref Range Status   SARS Coronavirus 2 NEGATIVE NEGATIVE Final    Comment: (NOTE) SARS-CoV-2 target nucleic acids are NOT DETECTED. The SARS-CoV-2 RNA is generally detectable in upper and lower respiratory specimens during the acute phase of infection. Negative results do not preclude SARS-CoV-2 infection, do not rule out co-infections with other pathogens, and should not be used as the sole basis for treatment or other patient management decisions. Negative results must be combined with clinical observations, patient history, and epidemiological information. The expected result is Negative. Fact Sheet for Patients: SugarRoll.be Fact Sheet for Healthcare Providers: https://www.woods-mathews.com/ This test is not yet approved or cleared by the [place] FDA and  has been authorized for detection and/or diagnosis of  SARS-CoV-2 by FDA under an Emergency Use Authorization (EUA). This EUA will remain  in effect (meaning this test can be used) for the duration of the COVID-19 declaration under Section 56 4(b)(1) of the Act, 21 U.S.C. section 360bbb-3(b)(1), unless the authorization is terminated or revoked sooner. Performed at [facility], [place] [address] [place], [place] [address]     Radiology Reports Dg Chest 1 View  Result Date: 05/27/2019 CLINICAL DATA:  Cellulitis. EXAM: CHEST  1 VIEW COMPARISON:  August 01, 2017. FINDINGS: The heart size and mediastinal contours are within normal limits. Both lungs are clear. Status post coronary bypass graft. The visualized skeletal structures are unremarkable. IMPRESSION: No active disease. Electronically Signed   By: Marijo Conception M.D.   On: 05/27/2019 11:34   Dg Foot Complete Right  Result Date: 05/27/2019 CLINICAL DATA:  Cellulitis.  Right foot swelling. EXAM: RIGHT FOOT COMPLETE - 3+ VIEW COMPARISON:  None. FINDINGS: There is no evidence of fracture or dislocation. There is no evidence of arthropathy or other focal bone abnormality. No lytic destruction is seen to suggest osteomyelitis. Soft tissues are unremarkable. IMPRESSION: Negative. Electronically Signed   By: Marijo Conception M.D.   On: 05/27/2019 11:36   Vas [place] Burnard Bunting With/wo Tbi  Result Date: 05/30/2019 LOWER EXTREMITY DOPPLER STUDY Indications: Ulceration, gangrene, and peripheral artery disease. High Risk Factors: Hypertension, hyperlipidemia, Diabetes, current smoker,  coronary artery disease.  Vascular Interventions: Right ICA stent 03/28/15, CABG 2011. Comparison Study: No prior study on file for comparison Performing Technologist: Sharion Dove RVS  Examination Guidelines: A complete evaluation includes at minimum, Doppler waveform signals and systolic blood pressure reading at the level of bilateral brachial, anterior tibial, and posterior tibial arteries, when vessel  segments are accessible. Bilateral testing is considered an integral part of a complete examination. Photoelectric Plethysmograph (PPG) waveforms and toe systolic pressure readings are included as required and additional duplex testing as needed. Limited examinations for reoccurring indications may be performed as noted.  ABI Findings: +---------+------------------+-----+-------------------+------------------+  Right     Rt Pressure (mmHg) Index Waveform            Comment             +---------+------------------+-----+-------------------+------------------+  Brachial  135                      triphasic                               +---------+------------------+-----+-------------------+------------------+  PTA       54                 0.40  dampened monophasic                     +---------+------------------+-----+-------------------+------------------+  DP        40                 0.30  monophasic                              +---------+------------------+-----+-------------------+------------------+  Great Toe                                              bandage/ulceration  +---------+------------------+-----+-------------------+------------------+ +---------+------------------+-----+-------------------+-------+  Left      Lt Pressure (mmHg) Index Waveform            Comment  +---------+------------------+-----+-------------------+-------+  Brachial  129                      triphasic                    +---------+------------------+-----+-------------------+-------+  PTA       38                 0.28  dampened monophasic          +---------+------------------+-----+-------------------+-------+  DP        33                 0.24  dampened monophasic          +---------+------------------+-----+-------------------+-------+  Great Toe 0                  0.00                      absent   +---------+------------------+-----+-------------------+-------+ +-------+-----------+-----------+------------+------------+   ABI/TBI Today's ABI Today's TBI Previous ABI Previous TBI  +-------+-----------+-----------+------------+------------+  Right   0.4         wound                                  +-------+-----------+-----------+------------+------------+  Left    0.2         absent                                 +-------+-----------+-----------+------------+------------+  Summary: Right: Resting right ankle-brachial index indicates severe right lower extremity arterial disease. Left: Resting left ankle-brachial index indicates critical left limb ischemia. The left toe-brachial index is abnormal.  *See table(s) above for measurements and observations.  Electronically signed by Servando Snare MD on 05/30/2019 at 5:01:14 PM.    Final     Lab Data:  CBC: Recent Labs  Lab 05/27/19 1059 05/27/19 1837 05/28/19 0530 05/30/19 0509 05/31/19 0616 06/01/19 0154  WBC 13.6* 11.6* 11.0* 9.7 7.9 10.6*  NEUTROABS 10.2*  --   --   --   --   --   HGB 13.2 12.0* 11.9* 11.5* 11.0* 9.4*  HCT 40.0 36.6* 36.5* 35.1* 32.8* 28.5*  MCV 96.9 96.1 97.9 97.8 96.2 96.3  PLT 306 275 258 298 268 740   Basic Metabolic Panel: Recent Labs  Lab 05/28/19 0530 05/29/19 0624 05/29/19 1216 05/30/19 0509 05/31/19 0616 06/01/19 0154  NA 129*  --  131* 133* 134* 132*  K 4.1  --  4.4 4.4 4.8 4.4  CL 100  --  98 102 103 102  CO2 19*  --  22 21* 22 21*  GLUCOSE 205*  --  210* 192* 139* 121*  BUN 14  --  '16 15 15 14  '$ CREATININE 1.02 1.12 1.23 1.13 1.14 0.90  CALCIUM 8.7*  --  9.3 9.4 9.3 9.0   GFR: Estimated Creatinine Clearance: 80.7 mL/min (by C-G formula based on SCr of 0.9 mg/dL). Liver Function Tests: No results for input(s): AST, ALT, ALKPHOS, BILITOT, PROT, ALBUMIN in the last 168 hours. No results for input(s): LIPASE, AMYLASE in the last 168 hours. No results for input(s): AMMONIA in the last 168 hours. Coagulation Profile: No results for input(s): INR, PROTIME in the last 168 hours. Cardiac Enzymes: No results for  input(s): CKTOTAL, CKMB, CKMBINDEX, TROPONINI in the last 168 hours. BNP (last 3 results) No results for input(s): PROBNP in the last 8760 hours. HbA1C: No results for input(s): HGBA1C in the last 72 hours. CBG: Recent Labs  Lab 05/31/19 0654 05/31/19 1242 05/31/19 1731 05/31/19 2118 06/01/19 0631  GLUCAP 129* 131* 168* 98 134*   Lipid Profile: No results for input(s): CHOL, HDL, LDLCALC, TRIG, CHOLHDL, LDLDIRECT in the last 72 hours. Thyroid Function Tests: No results for input(s): TSH, T4TOTAL, FREET4, T3FREE, THYROIDAB in the last 72 hours. Anemia Panel: No results for input(s): VITAMINB12, FOLATE, FERRITIN, TIBC, IRON, RETICCTPCT in the last 72 hours. Urine analysis:    Component Value Date/Time   COLORURINE YELLOW 07/08/2013 0226   APPEARANCEUR CLEAR 07/08/2013 0226   LABSPEC 1.006 07/08/2013 0226   PHURINE 6.0 07/08/2013 0226   GLUCOSEU NEGATIVE 07/08/2013 0226   HGBUR NEGATIVE 07/08/2013 0226   BILIRUBINUR NEGATIVE 07/08/2013 0226   KETONESUR NEGATIVE 07/08/2013 0226   PROTEINUR NEGATIVE 07/08/2013 0226   UROBILINOGEN 0.2 07/08/2013 0226   NITRITE NEGATIVE 07/08/2013 0226   LEUKOCYTESUR NEGATIVE 07/08/2013 0226     Darliss Cheney M.D. [facility] 06/01/2019, 7:42 AM   Call night coverage person covering after 7pm

## 2019-06-02 ENCOUNTER — Inpatient Hospital Stay (HOSPITAL_COMMUNITY): Payer: Medicare Other

## 2019-06-02 ENCOUNTER — Encounter (HOSPITAL_COMMUNITY): Payer: Self-pay | Admitting: Internal Medicine

## 2019-06-02 ENCOUNTER — Inpatient Hospital Stay (HOSPITAL_COMMUNITY): Admission: RE | Admit: 2019-06-02 | Payer: Medicare Other | Source: Home / Self Care | Admitting: Vascular Surgery

## 2019-06-02 ENCOUNTER — Encounter (HOSPITAL_COMMUNITY)
Admission: EM | Disposition: A | Discharge status: Skilled Nursing Facility | Payer: Self-pay | Source: Home / Self Care | Attending: Family Medicine

## 2019-06-02 HISTORY — PX: FEMORAL-POPLITEAL BYPASS GRAFT: SHX937

## 2019-06-02 HISTORY — PX: PATCH ANGIOPLASTY: SHX6230

## 2019-06-02 HISTORY — PX: ENDARTERECTOMY FEMORAL: SHX5804

## 2019-06-02 HISTORY — PX: AMPUTATION TOE: SHX6595

## 2019-06-02 LAB — POCT I-STAT, CHEM 8
BUN: 11 mg/dL (ref 8–23)
Calcium, Ion: 1.24 mmol/L (ref 1.15–1.40)
Chloride: 104 mmol/L (ref 98–111)
Creatinine, Ser: 0.8 mg/dL (ref 0.61–1.24)
Glucose, Bld: 137 mg/dL — ABNORMAL HIGH (ref 70–99)
HCT: 20 % — ABNORMAL LOW (ref 39.0–52.0)
Hemoglobin: 6.8 g/dL — CL (ref 13.0–17.0)
Potassium: 4.6 mmol/L (ref 3.5–5.1)
Sodium: 133 mmol/L — ABNORMAL LOW (ref 135–145)
TCO2: 19 mmol/L — ABNORMAL LOW (ref 22–32)

## 2019-06-02 LAB — POCT I-STAT EG7
Acid-base deficit: 7 mmol/L — ABNORMAL HIGH (ref 0.0–2.0)
Bicarbonate: 20 mmol/L (ref 20.0–28.0)
Calcium, Ion: 1.18 mmol/L (ref 1.15–1.40)
HCT: 30 % — ABNORMAL LOW (ref 39.0–52.0)
Hemoglobin: 10.2 g/dL — ABNORMAL LOW (ref 13.0–17.0)
O2 Saturation: 87 %
Patient temperature: 37.3
Potassium: 5.3 mmol/L — ABNORMAL HIGH (ref 3.5–5.1)
Sodium: 134 mmol/L — ABNORMAL LOW (ref 135–145)
TCO2: 21 mmol/L — ABNORMAL LOW (ref 22–32)
pCO2, Ven: 48.1 mmHg (ref 44.0–60.0)
pH, Ven: 7.228 — ABNORMAL LOW (ref 7.250–7.430)
pO2, Ven: 64 mmHg — ABNORMAL HIGH (ref 32.0–45.0)

## 2019-06-02 LAB — GLUCOSE, CAPILLARY
Glucose-Capillary: 75 mg/dL (ref 70–99)
Glucose-Capillary: 102 mg/dL — ABNORMAL HIGH (ref 70–99)
Glucose-Capillary: 161 mg/dL — ABNORMAL HIGH (ref 70–99)
Glucose-Capillary: 149 mg/dL — ABNORMAL HIGH (ref 70–99)
Glucose-Capillary: 141 mg/dL — ABNORMAL HIGH (ref 70–99)

## 2019-06-02 LAB — CBC
HCT: 25.8 % — ABNORMAL LOW (ref 39.0–52.0)
Hemoglobin: 8.6 g/dL — ABNORMAL LOW (ref 13.0–17.0)
MCH: 32.1 pg (ref 26.0–34.0)
MCHC: 33.3 g/dL (ref 30.0–36.0)
MCV: 96.3 fL (ref 80.0–100.0)
Platelets: 254 10*3/uL (ref 150–400)
RBC: 2.68 MIL/uL — ABNORMAL LOW (ref 4.22–5.81)
RDW: 12.6 % (ref 11.5–15.5)
WBC: 10.2 10*3/uL (ref 4.0–10.5)
nRBC: 0 % (ref 0.0–0.2)

## 2019-06-02 LAB — VANCOMYCIN, PEAK
Vancomycin Pk: 13 ug/mL — ABNORMAL LOW (ref 30–40)
Vancomycin Pk: 38 ug/mL (ref 30–40)

## 2019-06-02 LAB — SURGICAL PCR SCREEN
MRSA, PCR: NEGATIVE
Staphylococcus aureus: POSITIVE — AB

## 2019-06-02 LAB — PREPARE RBC (CROSSMATCH)

## 2019-06-02 SURGERY — BYPASS GRAFT FEMORAL-POPLITEAL ARTERY
Anesthesia: General | Site: Toe | Laterality: Right

## 2019-06-02 MED ORDER — ROCURONIUM BROMIDE 50 MG/5ML IV SOSY
PREFILLED_SYRINGE | INTRAVENOUS | Status: DC | PRN
  Administered 2019-06-02: 100 mg via INTRAVENOUS
  Administered 2019-06-02: 20 mg via INTRAVENOUS

## 2019-06-02 MED ORDER — ALBUMIN HUMAN 5 % IV SOLN
INTRAVENOUS | Status: DC | PRN
  Administered 2019-06-02 (×2): via INTRAVENOUS

## 2019-06-02 MED ORDER — MAGNESIUM SULFATE 2 GM/50ML IV SOLN: 2.0000 g | Freq: Every day | INTRAVENOUS | Status: DC | PRN

## 2019-06-02 MED ORDER — SODIUM CHLORIDE 0.9 % IV SOLN
INTRAVENOUS | Status: DC | PRN
  Administered 2019-06-02: 15:00:00 via INTRAVENOUS

## 2019-06-02 MED ORDER — FLEET ENEMA 7-19 GM/118ML RE ENEM: 1.0000 | ENEMA | Freq: Once | RECTAL | Status: DC | PRN

## 2019-06-02 MED ORDER — MIDAZOLAM HCL 5 MG/5ML IJ SOLN
INTRAMUSCULAR | Status: DC | PRN
  Administered 2019-06-02: 2 mg via INTRAVENOUS

## 2019-06-02 MED ORDER — POTASSIUM CHLORIDE CRYS ER 20 MEQ PO TBCR: 20.0000 meq | EXTENDED_RELEASE_TABLET | Freq: Every day | ORAL | Status: DC | PRN

## 2019-06-02 MED ORDER — PANTOPRAZOLE SODIUM 40 MG PO TBEC
40.0000 mg | DELAYED_RELEASE_TABLET | Freq: Every day | ORAL | Status: DC
  Administered 2019-06-02 – 2019-06-07 (×6): 40 mg via ORAL
  Filled 2019-06-02 (×6): qty 1

## 2019-06-02 MED ORDER — LACTATED RINGERS IV SOLN
INTRAVENOUS | Status: DC | PRN
  Administered 2019-06-02: 12:00:00 via INTRAVENOUS

## 2019-06-02 MED ORDER — CELECOXIB 200 MG PO CAPS
400.0000 mg | ORAL_CAPSULE | Freq: Once | ORAL | Status: AC
  Administered 2019-06-02: 400 mg via ORAL

## 2019-06-02 MED ORDER — CELECOXIB 200 MG PO CAPS
ORAL_CAPSULE | ORAL | Status: AC
  Filled 2019-06-02: qty 2

## 2019-06-02 MED ORDER — LIDOCAINE 2% (20 MG/ML) 5 ML SYRINGE
INTRAMUSCULAR | Status: DC | PRN
  Administered 2019-06-02: 100 mg via INTRAVENOUS

## 2019-06-02 MED ORDER — FENTANYL CITRATE (PF) 100 MCG/2ML IJ SOLN
INTRAMUSCULAR | Status: DC | PRN
  Administered 2019-06-02 (×2): 50 ug via INTRAVENOUS
  Administered 2019-06-02: 100 ug via INTRAVENOUS

## 2019-06-02 MED ORDER — DOCUSATE SODIUM 100 MG PO CAPS
100.0000 mg | ORAL_CAPSULE | Freq: Every day | ORAL | Status: DC
  Administered 2019-06-04 – 2019-06-05 (×2): 100 mg via ORAL
  Filled 2019-06-02 (×4): qty 1

## 2019-06-02 MED ORDER — SODIUM CHLORIDE 0.9 % IV SOLN
INTRAVENOUS | Status: DC | PRN
  Administered 2019-06-02: 500 mL

## 2019-06-02 MED ORDER — ALUM & MAG HYDROXIDE-SIMETH 200-200-20 MG/5ML PO SUSP: 15.0000 mL | ORAL | Status: DC | PRN

## 2019-06-02 MED ORDER — 0.9 % SODIUM CHLORIDE (POUR BTL) OPTIME
TOPICAL | Status: DC | PRN
  Administered 2019-06-02: 2000 mL

## 2019-06-02 MED ORDER — VANCOMYCIN HCL 10 G IV SOLR
1250.0000 mg | INTRAVENOUS | Status: DC
  Administered 2019-06-03: 1250 mg via INTRAVENOUS
  Filled 2019-06-02: qty 1250

## 2019-06-02 MED ORDER — PHENYLEPHRINE HCL-NACL 10-0.9 MG/250ML-% IV SOLN
INTRAVENOUS | Status: DC | PRN
  Administered 2019-06-02: 25 ug/min via INTRAVENOUS
  Administered 2019-06-02: 20 ug/min via INTRAVENOUS

## 2019-06-02 MED ORDER — FENTANYL CITRATE (PF) 100 MCG/2ML IJ SOLN: 25.0000 ug | INTRAMUSCULAR | Status: DC | PRN

## 2019-06-02 MED ORDER — ONDANSETRON HCL 4 MG/2ML IJ SOLN: 4.0000 mg | Freq: Four times a day (QID) | INTRAMUSCULAR | Status: DC | PRN

## 2019-06-02 MED ORDER — DEXAMETHASONE SODIUM PHOSPHATE 10 MG/ML IJ SOLN
INTRAMUSCULAR | Status: DC | PRN
  Administered 2019-06-02: 5 mg via INTRAVENOUS

## 2019-06-02 MED ORDER — HYDROMORPHONE HCL 1 MG/ML IJ SOLN
0.5000 mg | INTRAMUSCULAR | Status: DC | PRN
  Administered 2019-06-03 – 2019-06-06 (×9): 1 mg via INTRAVENOUS
  Filled 2019-06-02 (×9): qty 1

## 2019-06-02 MED ORDER — GUAIFENESIN-DM 100-10 MG/5ML PO SYRP: 15.0000 mL | ORAL_SOLUTION | ORAL | Status: DC | PRN

## 2019-06-02 MED ORDER — BISACODYL 5 MG PO TBEC
5.0000 mg | DELAYED_RELEASE_TABLET | Freq: Every day | ORAL | Status: DC | PRN
  Administered 2019-06-05: 5 mg via ORAL
  Filled 2019-06-02: qty 1

## 2019-06-02 MED ORDER — PROTAMINE SULFATE 10 MG/ML IV SOLN
INTRAVENOUS | Status: DC | PRN
  Administered 2019-06-02 (×2): 10 mg via INTRAVENOUS

## 2019-06-02 MED ORDER — SODIUM CHLORIDE 0.9 % IV SOLN
INTRAVENOUS | Status: DC
  Administered 2019-06-02 – 2019-06-03 (×3): via INTRAVENOUS

## 2019-06-02 MED ORDER — ONDANSETRON HCL 4 MG/2ML IJ SOLN
INTRAMUSCULAR | Status: DC | PRN
  Administered 2019-06-02: 4 mg via INTRAVENOUS

## 2019-06-02 MED ORDER — LACTATED RINGERS IV SOLN
INTRAVENOUS | Status: DC
  Administered 2019-06-02: 11:00:00 via INTRAVENOUS

## 2019-06-02 MED ORDER — SODIUM CHLORIDE 0.9 % IV SOLN
INTRAVENOUS | Status: AC
  Filled 2019-06-02 (×2): qty 1.2

## 2019-06-02 MED ORDER — PROMETHAZINE HCL 25 MG/ML IJ SOLN: 6.2500 mg | INTRAMUSCULAR | Status: DC | PRN

## 2019-06-02 MED ORDER — SODIUM CHLORIDE 0.9 % IV SOLN: 10.0000 mL/h | Freq: Once | INTRAVENOUS | Status: DC

## 2019-06-02 MED ORDER — ACETAMINOPHEN 500 MG PO TABS
1000.0000 mg | ORAL_TABLET | Freq: Once | ORAL | Status: AC
  Administered 2019-06-02: 1000 mg via ORAL

## 2019-06-02 MED ORDER — LACTATED RINGERS IV SOLN: INTRAVENOUS | Status: DC | PRN

## 2019-06-02 MED ORDER — HEPARIN SODIUM (PORCINE) 1000 UNIT/ML IJ SOLN
INTRAMUSCULAR | Status: DC | PRN
  Administered 2019-06-02: 5000 [IU] via INTRAVENOUS
  Administered 2019-06-02: 8000 [IU] via INTRAVENOUS

## 2019-06-02 MED ORDER — HEMOSTATIC AGENTS (NO CHARGE) OPTIME
TOPICAL | Status: DC | PRN
  Administered 2019-06-02: 2 via TOPICAL

## 2019-06-02 MED ORDER — SODIUM CHLORIDE 0.9 % IV SOLN: 500.0000 mL | Freq: Once | INTRAVENOUS | Status: DC | PRN

## 2019-06-02 MED ORDER — PHENOL 1.4 % MT LIQD: 1.0000 | OROMUCOSAL | Status: DC | PRN

## 2019-06-02 MED ORDER — ACETAMINOPHEN 500 MG PO TABS
ORAL_TABLET | ORAL | Status: AC
  Filled 2019-06-02: qty 2

## 2019-06-02 MED ORDER — SENNOSIDES-DOCUSATE SODIUM 8.6-50 MG PO TABS
1.0000 | ORAL_TABLET | Freq: Every evening | ORAL | Status: DC | PRN
  Administered 2019-06-05: 1 via ORAL
  Filled 2019-06-02: qty 1

## 2019-06-02 MED ORDER — PROPOFOL 10 MG/ML IV BOLUS
INTRAVENOUS | Status: DC | PRN
  Administered 2019-06-02: 150 mg via INTRAVENOUS

## 2019-06-02 MED ORDER — SUGAMMADEX SODIUM 200 MG/2ML IV SOLN
INTRAVENOUS | Status: DC | PRN
  Administered 2019-06-02: 200 mg via INTRAVENOUS

## 2019-06-02 MED ORDER — OXYCODONE-ACETAMINOPHEN 5-325 MG PO TABS
1.0000 | ORAL_TABLET | ORAL | Status: DC | PRN
  Administered 2019-06-02 – 2019-06-05 (×7): 2 via ORAL
  Administered 2019-06-05: 1 via ORAL
  Administered 2019-06-05 (×2): 2 via ORAL
  Administered 2019-06-06: 1 via ORAL
  Administered 2019-06-06 (×2): 2 via ORAL
  Administered 2019-06-06: 1 via ORAL
  Administered 2019-06-07 (×2): 2 via ORAL
  Filled 2019-06-02 (×2): qty 2
  Filled 2019-06-02: qty 1
  Filled 2019-06-02 (×9): qty 2
  Filled 2019-06-02: qty 1
  Filled 2019-06-02: qty 2
  Filled 2019-06-02 (×2): qty 1
  Filled 2019-06-02: qty 2

## 2019-06-02 MED ORDER — HYDRALAZINE HCL 20 MG/ML IJ SOLN: 5.0000 mg | INTRAMUSCULAR | Status: DC | PRN

## 2019-06-02 SURGICAL SUPPLY — 73 items
ADH SKN CLS APL DERMABOND .7 (GAUZE/BANDAGES/DRESSINGS) ×6
BANDAGE ESMARK 6X9 LF (GAUZE/BANDAGES/DRESSINGS) IMPLANT
BNDG CMPR 9X6 STRL LF SNTH (GAUZE/BANDAGES/DRESSINGS) ×3
BNDG ELASTIC 4X5.8 VLCR STR LF (GAUZE/BANDAGES/DRESSINGS) ×2 IMPLANT
BNDG ESMARK 6X9 LF (GAUZE/BANDAGES/DRESSINGS) ×5
BNDG GAUZE ELAST 4 BULKY (GAUZE/BANDAGES/DRESSINGS) ×2 IMPLANT
CANISTER SUCT 3000ML PPV (MISCELLANEOUS) ×5 IMPLANT
CANNULA VESSEL 3MM 2 BLNT TIP (CANNULA) ×2 IMPLANT
CLIP VESOCCLUDE MED 24/CT (CLIP) ×5 IMPLANT
CLIP VESOCCLUDE SM WIDE 24/CT (CLIP) ×5 IMPLANT
COVER WAND RF STERILE (DRAPES) ×3 IMPLANT
CUFF TOURN SGL QUICK 24 (TOURNIQUET CUFF) ×5
CUFF TOURN SGL QUICK 34 (TOURNIQUET CUFF)
CUFF TOURN SGL QUICK 42 (TOURNIQUET CUFF) IMPLANT
CUFF TRNQT CYL 24X4X16.5-23 (TOURNIQUET CUFF) IMPLANT
CUFF TRNQT CYL 34X4.125X (TOURNIQUET CUFF) IMPLANT
DERMABOND ADVANCED (GAUZE/BANDAGES/DRESSINGS) ×4
DERMABOND ADVANCED .7 DNX12 (GAUZE/BANDAGES/DRESSINGS) ×3 IMPLANT
DRAIN CHANNEL 15F RND FF W/TCR (WOUND CARE) IMPLANT
DRAPE C-ARM 42X72 X-RAY (DRAPES) IMPLANT
DRAPE HALF SHEET 40X57 (DRAPES) IMPLANT
DRAPE X-RAY CASS 24X20 (DRAPES) IMPLANT
ELECT REM PT RETURN 9FT ADLT (ELECTROSURGICAL) ×5
ELECTRODE REM PT RTRN 9FT ADLT (ELECTROSURGICAL) ×3 IMPLANT
EVACUATOR SILICONE 100CC (DRAIN) IMPLANT
GAUZE SPONGE 4X4 12PLY STRL (GAUZE/BANDAGES/DRESSINGS) ×2 IMPLANT
GAUZE XEROFORM 1X8 LF (GAUZE/BANDAGES/DRESSINGS) ×2 IMPLANT
GLOVE BIO SURGEON STRL SZ 6.5 (GLOVE) ×2 IMPLANT
GLOVE BIO SURGEON STRL SZ7.5 (GLOVE) ×7 IMPLANT
GLOVE BIO SURGEONS STRL SZ 6.5 (GLOVE) ×2
GLOVE BIOGEL PI IND STRL 6.5 (GLOVE) IMPLANT
GLOVE BIOGEL PI IND STRL 7.0 (GLOVE) IMPLANT
GLOVE BIOGEL PI IND STRL 7.5 (GLOVE) IMPLANT
GLOVE BIOGEL PI INDICATOR 6.5 (GLOVE) ×2
GLOVE BIOGEL PI INDICATOR 7.0 (GLOVE) ×2
GLOVE BIOGEL PI INDICATOR 7.5 (GLOVE) ×2
GLOVE ECLIPSE 7.0 STRL STRAW (GLOVE) ×2 IMPLANT
GLOVE SURG SS PI 6.5 STRL IVOR (GLOVE) ×2 IMPLANT
GOWN STRL REUS W/ TWL LRG LVL3 (GOWN DISPOSABLE) ×6 IMPLANT
GOWN STRL REUS W/ TWL XL LVL3 (GOWN DISPOSABLE) ×3 IMPLANT
GOWN STRL REUS W/TWL LRG LVL3 (GOWN DISPOSABLE) ×20
GOWN STRL REUS W/TWL XL LVL3 (GOWN DISPOSABLE) ×5
GRAFT PROPATEN W/RING 6X80X60 (Vascular Products) ×2 IMPLANT
HEMOSTAT SNOW SURGICEL 2X4 (HEMOSTASIS) ×4 IMPLANT
INSERT FOGARTY SM (MISCELLANEOUS) ×2 IMPLANT
KIT BASIN OR (CUSTOM PROCEDURE TRAY) ×5 IMPLANT
KIT TURNOVER KIT B (KITS) ×5 IMPLANT
MARKER GRAFT CORONARY BYPASS (MISCELLANEOUS) IMPLANT
NS IRRIG 1000ML POUR BTL (IV SOLUTION) ×10 IMPLANT
PACK PERIPHERAL VASCULAR (CUSTOM PROCEDURE TRAY) ×5 IMPLANT
PAD ARMBOARD 7.5X6 YLW CONV (MISCELLANEOUS) ×10 IMPLANT
PAD CAST 4YDX4 CTTN HI CHSV (CAST SUPPLIES) IMPLANT
PADDING CAST COTTON 4X4 STRL (CAST SUPPLIES) ×5
PATCH HEMASHIELD 8X75 (Vascular Products) ×2 IMPLANT
SET COLLECT BLD 21X3/4 12 (NEEDLE) IMPLANT
SPONGE LAP 18X18 RF (DISPOSABLE) ×2 IMPLANT
STOPCOCK 4 WAY LG BORE MALE ST (IV SETS) IMPLANT
SUT ETHILON 3 0 PS 1 (SUTURE) ×6 IMPLANT
SUT MNCRL AB 4-0 PS2 18 (SUTURE) ×10 IMPLANT
SUT PROLENE 5 0 C 1 24 (SUTURE) ×25 IMPLANT
SUT PROLENE 6 0 BV (SUTURE) ×7 IMPLANT
SUT PROLENE 7 0 BV 1 (SUTURE) IMPLANT
SUT SILK 2 0 SH (SUTURE) ×5 IMPLANT
SUT SILK 3 0 (SUTURE)
SUT SILK 3-0 18XBRD TIE 12 (SUTURE) IMPLANT
SUT VIC AB 2-0 CT1 27 (SUTURE) ×10
SUT VIC AB 2-0 CT1 TAPERPNT 27 (SUTURE) ×6 IMPLANT
SUT VIC AB 3-0 SH 27 (SUTURE) ×10
SUT VIC AB 3-0 SH 27X BRD (SUTURE) ×6 IMPLANT
TOWEL GREEN STERILE (TOWEL DISPOSABLE) ×5 IMPLANT
TRAY FOLEY MTR SLVR 16FR STAT (SET/KITS/TRAYS/PACK) ×5 IMPLANT
UNDERPAD 30X30 (UNDERPADS AND DIAPERS) ×5 IMPLANT
WATER STERILE IRR 1000ML POUR (IV SOLUTION) ×5 IMPLANT

## 2019-06-02 NOTE — Op Note (Signed)
Patient name: Jason [name] MRN: [id number] DOB: Oct 21, 1950 Sex: male  06/02/2019 Pre-operative Diagnosis: critical right lower extremity ischemia Post-operative diagnosis:  Same Surgeon:  Erlene Quan C. Donzetta Matters, MD Assistants: Leontine Locket, PA; Laurence Slate, [place] Procedure Performed: 1.  Right external iliac common femoral and profundofemoral endarterectomy with Dacron patch angioplasty 2.  Right common femoral to below-knee popliteal artery bypass with 6 mm ringed PTFE 3.  Amputation right first and second toes  Indications: 68 year old male with new onset gangrenous changes right first and base of second toe.  He has undergone angiography which demonstrates nonflow limiting stenosis in his common external leg arteries as well as occlusion of his SFA at the takeoff with profunda flow and reconstitution of what appears to be a diseased above-knee popliteal artery with runoff via anterior tibial and peroneal arteries on the right.  Findings: The common femoral artery was heavily diseased up into the external and extensive endarterectomy was performed.  There was what appeared to be a external iliac to SFA with PTFE graft.  Profunda did have good backflow after endarterectomy.  We performed patch angioplasty of the common femoral artery and then performed bypass to the below-knee popliteal artery which was heavily diseased.  At completion there was good signal at the anterior tibial and peroneal arteries at the ankle.  The first 2 toes were removed with adequate bleeding in the wound bed.   Procedure:  The patient was identified in the holding area and taken to the operating room where he was placed supine operative general anesthesia induced.  Sterilely prepped and draped in the right groin right lower extremity usual fashion antibiotics were minister timeout was called.  Previous incision was marked on the skin this was opened longitudinally.  We dissected down to the inguinal ligament.  We  identified the common femoral artery.  We dissected out the profunda distally as well as the SFA.  We dissected up onto the inguinal ligament.  All of the artery felt heavily diseased.  We placed Vesseloops around the external leg artery high up onto the inguinal ligament.  We then turned our attention below the knee where longitudinal incision was made we dissected down identified our popliteal artery which did feel heavily diseased as well.  We plan to use a tourniquet.  We marked the popliteal artery with marking pen.  We tunneled a 6 mm PTFE graft between the 2 incisions in a subfascial plane.  Patient was fully heparinized at this time.  We then clamped the profunda SFA and external leg arteries.  We opened the previous graft which was heavily calcified.  We performed extensive endarterectomy up onto the inguinal ligament of the external leg artery.  There was one area on the artery that had quite thin we did have to repair this with suture.  Down into the SFA that artery was heavily calcified and chronically occluded.  We did perform limited endarterectomy there.  The profunda was subtotally occluded we performed extensive endarterectomy and establish good backbleeding.  We flushed this with overnight stay and clamped it.  We then sewed a dacryon patch in place with 5-0 Prolene suture.  Prior completion without flushing all directions.  After completion we had good signal in our profunda distally.  We then reclamped our common femoral artery opened the patch longitudinally.  We trimmed the graft to size and sewed it end-to-side with 6-0 Prolene suture.  Upon completion we then flushed through the graft.  We had good arterial bleeding  distally through the graft.  We reclamped the graft and flushed with heparinized saline.  We turned our attention to the below-knee bypass.  We placed a tourniquet above the knee exsanguinated the leg with Esmarch and inflated the tourniquet to 250 mmHg.  We then opened the  popliteal artery longitudinally.  It was heavily diseased there.  We did have an area with good lumen distally.  We straighten her leg and trimmed our graft to size and sewed it into side with 6-0 Prolene suture.  Prior to the completion we allowed our tourniquet down.  We did have good backbleeding.  We then flushed to the graft.  Completed the anastomosis.  This time we had good signal in the popliteal distally.  We also had a good anterior tibial and peroneal signal of the ankle.  50 mg of protamine was administered.  We obtain hemostasis in both wounds irrigated with heparinized saline and closed in layers with Vicryl and Monocryl.  Dermabond placed to the level of the skin.  I then turned my attention to the right foot.  A tennis racquet type incision was made around the first 2 toes onto the first metatarsal.  We dissected back transected the metatarsals with bone clipper.  I remove the sesamoid bones.  Obtain hemostasis and thoroughly irrigated the wound and closed the skin with interrupted 3-0 nylon suture.  Patient was then awakened anesthesia having tolerated procedure well.  All counts were correct at completion.   EBL: 550cc  Karlon Schlafer C. Donzetta Matters, MD [facility] Office: [phone] Pager: [phone]

## 2019-06-02 NOTE — Progress Notes (Signed)
[facility]                                                                              Patient Demographics  Jason [name], is a 68 y.o. male, DOB - April 26, 1951, JQZ:[id number]  Admit date - 05/27/2019   Admitting Physician Georgette Shell, MD  Outpatient Primary MD for the patient is Patient, [name]  Outpatient specialists:   LOS - 6  days   Medical records reviewed and are as summarized below:  Chief Complaint  Patient presents with   Foot Pain       Brief summary   Jason [name] is a 68 y.o. male  presented from [facility] with medical history of DM, not on medications for 1 month, b/l Carotid artery disease with R ICA stent, occluded L ICA stent,CVA, COPD, CAD s/p CABG in 2011, and and NSTEMI and DES to sVF- OM2 in 2018, HTN, GERD, nicotine abuse presented with about 4 days of right foot pain followed by a change in color of his 1st toe to black. He admits to fever, chills, nausea and vomiting. He reported subjective fevers/ chill about 1 wk ago. The foot then became red and painful and the toe then became purple and black. His pain is moderate and worse when walking, radiates up his leg. He has had nausea and occasional vomiting for 2 days. He has not been able to pick up his medications in at least 1 month as he lives in [place] and his pharmacy is in [place].  In ED sodium 125, CO2 18, WBCs 13.6.  Right foot x-ray showed no osteomyelitis or gas.  He was admitted under hospital service due to sepsis secondary to right foot cellulitis/gangrene of the first toe with history of PVD.  Blood culture negative to date.  He was started on Zosyn and vancomycin.  He was seen by vascular surgery and they recommended aortogram for which patient was transferred to [facility] on 05/29/2019.  Underwent ultrasound-guided cannulation of the left and right common femoral artery, aortogram with bilateral lower extremity runoff on 05/31/2019.  Findings below  per vascular surgeon note "The aorta and iliac segments are heavily diseased but there does appear to be nonflow-limiting stenosis to the both common femoral arteries.  On the right side which is the site of issue he possibly has a bypass to the profunda the SFA is flush occluded.  He reconstitutes above-knee popliteal.  Appears to have runoff via the anterior tibial and peroneal arteries.  On the left side he has a very diseased SFA.  Appears to occlude his tibioperoneal trunk and has runoff to the ankle via peroneal artery"  Assessment & Plan    Principal Problem: Sepsis with right foot cellulitis, gangrene of the right first toe with history of PVD, right femoral endarterectomy 2004 -Met sepsis criteria. Blood cultures no growth so far. Given underlying history of PVD, vascular surgery consulted. Patient was seen by Dr. Scot Dock,, recommended to transfer to [facility] for arteriogram and may need amputation of the right great toe after that.  He underwent aortogram which showed severe multivessel  peripheral arterial disease.  Plan for right common femoral to popliteal artery bypass today, if not successful then amputation. -Continue IV vancomycin and Zosyn   Active Problems: Diabetes mellitus type 2, uncontrolled with hyperglycemia, noncompliant, with right foot cellulitis, gangrene -Patient reports that he ran out of his Glucophage over a month ago, used to be on insulin in the past -Hemoglobin A1c 8.5, Blood sugar controlled. Continue 10 units of Lantus and SSI.  Hyponatremia, has also underlying chronic hyponatremia.  Stable around 132.  History of coronary disease s/p CABG, stent -Continue Plavix, aspirin, metoprolol, statin  Prolonged QTC, trigeminy -QTC 532, EKG changes, extensive cardiac history, continue telemetry, repeat EKG on 06/01/2019 shows QTc of 506.  Hypothyroidism -TSH 5.18, free T4 0.8, ran out of Synthroid, resumed Synthroid 50 mcg.  Depression Ran out of Zoloft,  resumed.  Monitor sodium closely.  Essential hypertension Blood pressure fairly stable.,  Continue to hold Imdur, and continue decreased dose of Toprol-XL to 25 mg daily  Nicotine use Patient counseled, continue nicotine patch  Code Status: Partial (yes to everything but no intubation) DVT Prophylaxis:  Lovenox  Family Communication: No family present.  Discussed plan of care with patient. Disposition Plan: To be determined based on further vascular work-up and will be discharged once cleared by vascular surgery.  Plan for bypass right lower extremity tomorrow.  Time Spent in minutes 24 minutes  Procedures:  None  Consultants:   Vascular surgery, Dr. Scot Dock  Antimicrobials:   Anti-infectives (From admission, onward)   Start     Dose/Rate Route Frequency Ordered Stop   05/29/19 2200  vancomycin (VANCOCIN) 1,500 mg in sodium chloride 0.9 % 500 mL IVPB     1,500 mg 250 mL/hr over 120 Minutes Intravenous Every 24 hours 05/29/19 1419     05/29/19 2200  metroNIDAZOLE (FLAGYL) IVPB 500 mg     500 mg 100 mL/hr over 60 Minutes Intravenous Every 8 hours 05/29/19 1427     05/29/19 2200  ceFEPIme (MAXIPIME) 2 g in sodium chloride 0.9 % 100 mL IVPB     2 g 200 mL/hr over 30 Minutes Intravenous Every 8 hours 05/29/19 1428     05/28/19 2200  vancomycin (VANCOCIN) 1,750 mg in sodium chloride 0.9 % 500 mL IVPB  Status:  Discontinued     1,750 mg 250 mL/hr over 120 Minutes Intravenous Every 24 hours 05/28/19 0610 05/29/19 1419   05/28/19 1000  vancomycin (VANCOCIN) 1,750 mg in sodium chloride 0.9 % 500 mL IVPB  Status:  Discontinued     1,750 mg 250 mL/hr over 120 Minutes Intravenous Every 24 hours 05/27/19 1949 05/28/19 0610   05/27/19 2000  piperacillin-tazobactam (ZOSYN) IVPB 3.375 g  Status:  Discontinued     3.375 g 12.5 mL/hr over 240 Minutes Intravenous Every 8 hours 05/27/19 1500 05/27/19 1941   05/27/19 2000  piperacillin-tazobactam (ZOSYN) IVPB 3.375 g  Status:  Discontinued      3.375 g 12.5 mL/hr over 240 Minutes Intravenous Every 8 hours 05/27/19 1941 05/29/19 1427   05/27/19 1800  piperacillin-tazobactam (ZOSYN) IVPB 3.375 g  Status:  Discontinued     3.375 g 100 mL/hr over 30 Minutes Intravenous  Once 05/27/19 1748 05/27/19 1941   05/27/19 1800  vancomycin (VANCOCIN) 1,500 mg in sodium chloride 0.9 % 500 mL IVPB     1,500 mg 250 mL/hr over 120 Minutes Intravenous  Once 05/27/19 1748 05/28/19 0543   05/27/19 1715  piperacillin-tazobactam (ZOSYN) IVPB 3.375 g  Status:  Discontinued  3.375 g 100 mL/hr over 30 Minutes Intravenous Every 6 hours 05/27/19 1212 05/27/19 1500   05/27/19 1215  piperacillin-tazobactam (ZOSYN) IVPB 3.375 g  Status:  Discontinued     3.375 g 100 mL/hr over 30 Minutes Intravenous Every 6 hours 05/27/19 1208 05/27/19 1212   05/27/19 1100  piperacillin-tazobactam (ZOSYN) IVPB 3.375 g     3.375 g 100 mL/hr over 30 Minutes Intravenous  Once 05/27/19 1055 05/27/19 1155         Medications  Scheduled Meds:  aspirin EC  81 mg Oral Daily   clopidogrel  75 mg Oral Daily   enoxaparin (LOVENOX) injection  40 mg Subcutaneous Q24H   insulin aspart  0-15 Units Subcutaneous TID WC   insulin aspart  0-5 Units Subcutaneous QHS   insulin glargine  15 Units Subcutaneous QHS   levothyroxine  50 mcg Oral Daily   metoprolol succinate  25 mg Oral Daily   nicotine  21 mg Transdermal Q24H   rosuvastatin  10 mg Oral Daily   sertraline  250 mg Oral QHS   sodium chloride flush  3 mL Intravenous Q12H   Continuous Infusions:  sodium chloride 10 mL/hr at 06/01/19 2143   sodium chloride     ceFEPime (MAXIPIME) IV 2 g (06/02/19 0511)   metronidazole 500 mg (06/02/19 0569)   vancomycin 1,500 mg (06/02/19 0054)   PRN Meds:.sodium chloride, sodium chloride, acetaminophen **OR** acetaminophen, albuterol, famotidine, hydrALAZINE, HYDROcodone-acetaminophen, ondansetron (ZOFRAN) IV, oxyCODONE-acetaminophen, sodium chloride  flush      Subjective:   Seen and examined.  Continues to complain of right foot pain.  7 out of 10.  No other complaint. Objective:   Vitals:   06/01/19 2201 06/02/19 0436 06/02/19 0437 06/02/19 0552  BP:  112/62 112/62   Pulse:  67 70   Resp: (!) '22 18 18   '$ Temp:  98.1 F (36.7 C)    TempSrc:  Oral    SpO2:  98% 98%   Weight:    75.6 kg  Height:        Intake/Output Summary (Last 24 hours) at 06/02/2019 0942 Last data filed at 06/02/2019 7948 Gross per 24 hour  Intake 1077.39 ml  Output --  Net 1077.39 ml     Wt Readings from Last 3 Encounters:  06/02/19 75.6 kg  03/02/19 77.2 kg  05/06/18 74.1 kg   Physical Exam General exam: Appears calm and comfortable  Respiratory system: Clear to auscultation. Respiratory effort normal. Cardiovascular system: S1 & S2 heard, RRR. No JVD, murmurs, rubs, gallops or clicks. No pedal edema. Gastrointestinal system: Abdomen is nondistended, soft and nontender. No organomegaly or masses felt. Normal bowel sounds heard. Central nervous system: Alert and oriented. No focal neurological deficits. Extremities: Symmetric 5 x 5 power. Skin: Right necrotic toe.  Dressing in place.  Direct visualization defer to specialist. Psychiatry: Judgement and insight appear normal. Mood & affect appropriate.          Data Reviewed:  I have personally reviewed following labs and imaging studies  Micro Results Recent Results (from the past 240 hour(s))  Culture, blood (routine x 2)     Status: None   Collection Time: 05/27/19 10:45 AM   Specimen: BLOOD  Result Value Ref Range Status   Specimen Description BLOOD LEFT ANTECUBITAL  Final   Special Requests   Final    BOTTLES DRAWN AEROBIC ONLY Blood Culture results may not be optimal due to an inadequate volume of blood received in culture bottles  Culture   Final    NO GROWTH 5 DAYS Performed at [facility], [place] [address] [place], [place] [address]    Report Status 06/01/2019  FINAL  Final  Culture, blood (routine x 2)     Status: None   Collection Time: 05/27/19 11:00 AM   Specimen: BLOOD RIGHT WRIST  Result Value Ref Range Status   Specimen Description   Final    BLOOD RIGHT WRIST Performed at [facility], [address] [place], [place] [address]    Special Requests   Final    BOTTLES DRAWN AEROBIC AND ANAEROBIC Blood Culture adequate volume Performed at [facility], [address], [place], [place] [address]    Culture   Final    NO GROWTH 5 DAYS Performed at [facility], [place] [address] [place], [place] [address]    Report Status 06/01/2019 FINAL  Final  SARS CORONAVIRUS 2 (TAT 6-24 HRS) Nasopharyngeal Nasopharyngeal Swab     Status: None   Collection Time: 05/27/19 11:08 AM   Specimen: Nasopharyngeal Swab  Result Value Ref Range Status   SARS Coronavirus 2 NEGATIVE NEGATIVE Final    Comment: (NOTE) SARS-CoV-2 target nucleic acids are NOT DETECTED. The SARS-CoV-2 RNA is generally detectable in upper and lower respiratory specimens during the acute phase of infection. Negative results do not preclude SARS-CoV-2 infection, do not rule out co-infections with other pathogens, and should not be used as the sole basis for treatment or other patient management decisions. Negative results must be combined with clinical observations, patient history, and epidemiological information. The expected result is Negative. Fact Sheet for Patients: SugarRoll.be Fact Sheet for Healthcare Providers: https://www.woods-mathews.com/ This test is not yet approved or cleared by the [place] FDA and  has been authorized for detection and/or diagnosis of SARS-CoV-2 by FDA under an Emergency Use Authorization (EUA). This EUA will remain  in effect (meaning this test can be used) for the duration of the COVID-19 declaration under Section 56 4(b)(1) of the Act, 21 U.S.C. section 360bbb-3(b)(1),  unless the authorization is terminated or revoked sooner. Performed at [facility], [place] [address] [place], [place] [address]   Surgical pcr screen     Status: Abnormal   Collection Time: 06/02/19  4:38 AM   Specimen: Nasal Mucosa; Nasal Swab  Result Value Ref Range Status   MRSA, PCR NEGATIVE NEGATIVE Final   Staphylococcus aureus POSITIVE (A) NEGATIVE Final    Comment: (NOTE) The Xpert SA Assay (FDA approved for NASAL specimens in patients 47 years of age and older), is one component of a comprehensive surveillance program. It is not intended to diagnose infection nor to guide or monitor treatment. Performed at [facility], [place] [address], [place], [place] [address]     Radiology Reports DG Chest 1 View  Result Date: 05/27/2019 CLINICAL DATA:  Cellulitis. EXAM: CHEST  1 VIEW COMPARISON:  August 01, 2017. FINDINGS: The heart size and mediastinal contours are within normal limits. Both lungs are clear. Status post coronary bypass graft. The visualized skeletal structures are unremarkable. IMPRESSION: No active disease. Electronically Signed   By: Marijo Conception M.D.   On: 05/27/2019 11:34   PERIPHERAL VASCULAR CATHETERIZATION  Result Date: 05/31/2019 Patient name: CYRIL WOODMANSEE MRN: [id number] DOB: 08-30-1950 Sex: male 05/31/2019 Pre-operative Diagnosis: Critical right lower extremity ischemia with great toe ulceration Post-operative diagnosis:  Same Surgeon:  Erlene Quan C. Donzetta Matters, MD Procedure Performed: 1.  Ultrasound-guided cannulation left common femoral artery 2.  Ultrasound-guided cannulation right common femoral artery 3.  Aortogram with bilateral lower extremity runoff 4.  Moderate sedation with fentanyl and Versed for Indications: 68 year old male here with great toe ulceration on the right.  He has severely depressed ABIs bilaterally.  Has had previous intervention on the right common femoral artery after cardiac catheterization.  Has possibly had greater saphenous  vein harvested on the right as well. Findings: I cannot access from the left side because a dissection plane.  From the right side I was able to access.  The aorta and iliac segments are heavily diseased but there does appear to be nonflow-limiting stenosis to the both common femoral arteries.  On the right side which is the site of issue he possibly has a bypass to the profunda the SFA is flush occluded.  He reconstitutes above-knee popliteal.  Appears to have runoff via the anterior tibial and peroneal arteries.  On the left side he has a very diseased SFA.  Appears to occlude his tibioperoneal trunk and has runoff to the ankle via peroneal artery. We will consider patient for right common femoral to popliteal artery bypass.  He will be vein mapped for bilateral greater saphenous veins.  Procedure:  The patient was identified in the holding area and taken to room 8.  The patient was then placed supine on the table and prepped and draped in the usual sterile fashion.  A time out was called.  Ultrasound was used to evaluate the left common femoral artery.  This was heavily diseased.  The cannulated 1 healthy area with micropuncture needle followed by wire and sheath.  Images saved the permanent record.  The wire would not pass I did use fluoroscopy to get it to pass further.  I placed a micropuncture sheath.  I then used a stiff angled Glidewire.  Unfortunately was in a subintimal plane.  I placed a 5 [place] sheath 10 disease.  Catheter to reenter but could not.  I then used ultrasound to identify the common femoral artery on the right.  This also appeared to be heavily diseased there was scar tissue was possibly a bypass in place.  I cannulated this with direct ultrasound visualization images saved the permanent record.  I did get a Bentson wire to pass.  I then performed retrograde angiography with hand-injection to confirm I was intraluminal in the aorta.  I then placed a Omni catheter to the level of L1  performed aortogram followed bilateral lower extremity runoff with the above findings.  Patient will be considered for right common femoral to popliteal artery bypass.  We will vein map him.  He will likely need toe amputation as well.  Catheter was removed over wire.  Sheath will be pulled in postoperative holding.  He tolerated procedure well without immediate complication. Contrast: 100cc Brandon C. Donzetta Matters, MD [facility] Office: [phone] Pager: [phone]   DG Foot Complete Right  Result Date: 05/27/2019 CLINICAL DATA:  Cellulitis.  Right foot swelling. EXAM: RIGHT FOOT COMPLETE - 3+ VIEW COMPARISON:  None. FINDINGS: There is no evidence of fracture or dislocation. There is no evidence of arthropathy or other focal bone abnormality. No lytic destruction is seen to suggest osteomyelitis. Soft tissues are unremarkable. IMPRESSION: Negative. Electronically Signed   By: Marijo Conception M.D.   On: 05/27/2019 11:36   VAS [place] ABI WITH/WO TBI  Result Date: 05/30/2019 LOWER EXTREMITY DOPPLER STUDY Indications: Ulceration, gangrene, and peripheral  artery disease. High Risk Factors: Hypertension, hyperlipidemia, Diabetes, current smoker,                    coronary artery disease.  Vascular Interventions: Right ICA stent 03/28/15, CABG 2011. Comparison Study: No prior study on file for comparison Performing Technologist: Sharion Dove RVS  Examination Guidelines: A complete evaluation includes at minimum, Doppler waveform signals and systolic blood pressure reading at the level of bilateral brachial, anterior tibial, and posterior tibial arteries, when vessel segments are accessible. Bilateral testing is considered an integral part of a complete examination. Photoelectric Plethysmograph (PPG) waveforms and toe systolic pressure readings are included as required and additional duplex testing as needed. Limited examinations for reoccurring indications may be performed as noted.   ABI Findings: +---------+------------------+-----+-------------------+------------------+  Right     Rt Pressure (mmHg) Index Waveform            Comment             +---------+------------------+-----+-------------------+------------------+  Brachial  135                      triphasic                               +---------+------------------+-----+-------------------+------------------+  PTA       54                 0.40  dampened monophasic                     +---------+------------------+-----+-------------------+------------------+  DP        40                 0.30  monophasic                              +---------+------------------+-----+-------------------+------------------+  Great Toe                                              bandage/ulceration  +---------+------------------+-----+-------------------+------------------+ +---------+------------------+-----+-------------------+-------+  Left      Lt Pressure (mmHg) Index Waveform            Comment  +---------+------------------+-----+-------------------+-------+  Brachial  129                      triphasic                    +---------+------------------+-----+-------------------+-------+  PTA       38                 0.28  dampened monophasic          +---------+------------------+-----+-------------------+-------+  DP        33                 0.24  dampened monophasic          +---------+------------------+-----+-------------------+-------+  Great Toe 0                  0.00                      absent   +---------+------------------+-----+-------------------+-------+ +-------+-----------+-----------+------------+------------+  ABI/TBI Today's ABI Today's TBI Previous ABI Previous TBI  +-------+-----------+-----------+------------+------------+  Right   0.4         wound                                  +-------+-----------+-----------+------------+------------+  Left    0.2         absent                                  +-------+-----------+-----------+------------+------------+  Summary: Right: Resting right ankle-brachial index indicates severe right lower extremity arterial disease. Left: Resting left ankle-brachial index indicates critical left limb ischemia. The left toe-brachial index is abnormal.  *See table(s) above for measurements and observations.  Electronically signed by Servando Snare MD on 05/30/2019 at 5:01:14 PM.    Final    VAS [place] LOWER EXTREMITY SAPHENOUS VEIN MAPPING  Result Date: 06/01/2019 LOWER EXTREMITY VEIN MAPPING Indications:       ulceration Other Indications: Occluded right lower extremity bypass graft.  Comparison Study: No priors. Performing Technologist: Oda Cogan RDMS, RVT  Examination Guidelines: A complete evaluation includes B-mode imaging, spectral Doppler, color Doppler, and power Doppler as needed of all accessible portions of each vessel. Bilateral testing is considered an integral part of a complete examination. Limited examinations for reoccurring indications may be performed as noted. +--------------+----------------+-------------------+--------------+-----------+   RT Diameter     RT Findings            GSV          LT Diameter   LT Findings        (cm)                                                (cm)                   +--------------+----------------+-------------------+--------------+-----------+       0.51                         Saphenofemoral         0.58                                                         Junction                                   +--------------+----------------+-------------------+--------------+-----------+       0.37                         Proximal thigh         0.59                   +--------------+----------------+-------------------+--------------+-----------+                  not visualized       Mid thigh           0.37  and harvested                                                    +--------------+----------------+-------------------+--------------+-----------+                  not visualized     Distal thigh          0.43                                    and harvested                                                   +--------------+----------------+-------------------+--------------+-----------+                  not visualized         Knee              0.29                   +--------------+----------------+-------------------+--------------+-----------+       0.26         branching          Prox calf           0.24       branching   +--------------+----------------+-------------------+--------------+-----------+       0.21         branching          Mid calf            0.26                   +--------------+----------------+-------------------+--------------+-----------+       0.24                           Distal calf          0.20                   +--------------+----------------+-------------------+--------------+-----------+       0.27                              Ankle             0.23                   +--------------+----------------+-------------------+--------------+-----------+ Diagnosing physician: Curt Jews MD Electronically signed by Curt Jews MD on 06/01/2019 at 8:44:51 PM.    Final     Lab Data:  CBC: Recent Labs  Lab 05/27/19 1059 05/28/19 0530 05/30/19 0509 05/31/19 0616 06/01/19 0154 06/02/19 0056  WBC 13.6* 11.0* 9.7 7.9 10.6* 10.2  NEUTROABS 10.2*  --   --   --   --   --   HGB 13.2 11.9* 11.5* 11.0* 9.4* 8.6*  HCT 40.0 36.5* 35.1* 32.8* 28.5* 25.8*  MCV 96.9 97.9 97.8 96.2 96.3 96.3  PLT 306 258 298 268 268 287   Basic Metabolic Panel: Recent Labs  Lab 05/28/19 0530 05/29/19 0624 05/29/19 1216 05/30/19 0509 05/31/19 0616 06/01/19 0154  NA 129*  --  131* 133* 134* 132*  K 4.1  --  4.4 4.4 4.8 4.4  CL 100  --  98 102 103 102  CO2 19*  --  22 21* 22 21*  GLUCOSE 205*  --  210* 192* 139* 121*  BUN 14  --  '16 15 15 14  '$ CREATININE 1.02  1.12 1.23 1.13 1.14 0.90  CALCIUM 8.7*  --  9.3 9.4 9.3 9.0   GFR: Estimated Creatinine Clearance: 80.7 mL/min (by C-G formula based on SCr of 0.9 mg/dL). Liver Function Tests: No results for input(s): AST, ALT, ALKPHOS, BILITOT, PROT, ALBUMIN in the last 168 hours. No results for input(s): LIPASE, AMYLASE in the last 168 hours. No results for input(s): AMMONIA in the last 168 hours. Coagulation Profile: No results for input(s): INR, PROTIME in the last 168 hours. Cardiac Enzymes: No results for input(s): CKTOTAL, CKMB, CKMBINDEX, TROPONINI in the last 168 hours. BNP (last 3 results) No results for input(s): PROBNP in the last 8760 hours. HbA1C: No results for input(s): HGBA1C in the last 72 hours. CBG: Recent Labs  Lab 06/01/19 1638 06/01/19 1732 06/01/19 2112 06/01/19 2252 06/02/19 0640  GLUCAP 60* 140* 140* 97 75   Lipid Profile: No results for input(s): CHOL, HDL, LDLCALC, TRIG, CHOLHDL, LDLDIRECT in the last 72 hours. Thyroid Function Tests: No results for input(s): TSH, T4TOTAL, FREET4, T3FREE, THYROIDAB in the last 72 hours. Anemia Panel: No results for input(s): VITAMINB12, FOLATE, FERRITIN, TIBC, IRON, RETICCTPCT in the last 72 hours. Urine analysis:    Component Value Date/Time   COLORURINE YELLOW 07/08/2013 0226   APPEARANCEUR CLEAR 07/08/2013 0226   LABSPEC 1.006 07/08/2013 0226   PHURINE 6.0 07/08/2013 0226   GLUCOSEU NEGATIVE 07/08/2013 0226   HGBUR NEGATIVE 07/08/2013 0226   BILIRUBINUR NEGATIVE 07/08/2013 0226   KETONESUR NEGATIVE 07/08/2013 0226   PROTEINUR NEGATIVE 07/08/2013 0226   UROBILINOGEN 0.2 07/08/2013 0226   NITRITE NEGATIVE 07/08/2013 0226   LEUKOCYTESUR NEGATIVE 07/08/2013 0226     Darliss Cheney M.D. [facility] 06/02/2019, 9:42 AM   Call night coverage person covering after 7pm

## 2019-06-02 NOTE — Anesthesia Postprocedure Evaluation (Signed)
Anesthesia Post Note  Patient: Jason [name]  Procedure(s) Performed: RIGHT LEG BYPASS GRAFT FEMORAL-POPLITEAL ARTERY using Gore Propaten Vascular Graft Removable Ring (Right Leg Upper) Patch Angioplasty using Hemashield Platinum Finesse Patch of the External Iliac Femoral Artery and Profunda (Right Groin) Endarterectomy External Iliac  Femoral Artery and Profunda (Right Groin) Amputation Right Great Toe and Second Toe (Right Toe)     Patient location during evaluation: PACU Anesthesia Type: General Level of consciousness: awake and alert Pain management: pain level controlled Vital Signs Assessment: post-procedure vital signs reviewed and stable Respiratory status: spontaneous breathing, nonlabored ventilation, respiratory function stable and patient connected to nasal cannula oxygen Cardiovascular status: blood pressure returned to baseline and stable Postop Assessment: no apparent nausea or vomiting Anesthetic complications: no    Last Vitals:  Vitals:   06/02/19 1611 06/02/19 1626  BP: (!) 119/50 (!) 116/56  Pulse: 74 78  Resp: 20 20  Temp:  36.7 C  SpO2: 92% 98%    Last Pain:  Vitals:   06/02/19 1556  TempSrc:   PainSc: Asleep                 Rhandi Despain DANIEL

## 2019-06-02 NOTE — Anesthesia Procedure Notes (Signed)
Procedure Name: Intubation Date/Time: 06/02/2019 12:22 PM Performed by: Junie Bame, RN Pre-anesthesia Checklist: Patient identified, Emergency Drugs available, Suction available and Patient being monitored Patient Re-evaluated:Patient Re-evaluated prior to induction Oxygen Delivery Method: Circle System Utilized Preoxygenation: Pre-oxygenation with 100% oxygen Induction Type: IV induction Ventilation: Mask ventilation without difficulty Laryngoscope Size: Miller and 2 Grade View: Grade I Tube type: Oral Tube size: 7.5 mm Number of attempts: 1 Airway Equipment and Method: Stylet Placement Confirmation: ETT inserted through vocal cords under direct vision,  positive ETCO2 and breath sounds checked- equal and bilateral Secured at: 21 cm Tube secured with: Tape Dental Injury: Teeth and Oropharynx as per pre-operative assessment

## 2019-06-02 NOTE — Anesthesia Preprocedure Evaluation (Addendum)
Anesthesia Evaluation  Patient identified by MRN, date of birth, ID band Patient awake    Reviewed: Allergy & Precautions, NPO status , Patient's Chart, lab work & pertinent test results  History of Anesthesia Complications Negative for: history of anesthetic complications  Airway Mallampati: II  TM Distance: >3 FB Neck ROM: Full    Dental  (+) Poor Dentition, Loose, Dental Advisory Given   Pulmonary COPD, Current Smoker and Patient abstained from smoking.,    Pulmonary exam normal        Cardiovascular hypertension, Pt. on medications + angina + CAD, + Past MI, + CABG, + Peripheral Vascular Disease and +CHF  Normal cardiovascular exam  1. Severe 3 vessel obstructive CAD. 2. Patent LIMA to the LAD 3. Patent SVG sequentially to OM1 and OM2 but with critical thrombotic lesion in the body of the SVG 4. Occluded SVG to the RCA- chronic 5. Moderately elevated LVEDP 6. Successful stenting of SVG to OM 2. Procedure complicated by no reflow phenomena and occlusion of the first OM.   Study Conclusions  - Left ventricle: The cavity size was normal. There was mild focal   basal hypertrophy of the septum. Systolic function was normal.   The estimated ejection fraction was in the range of 60% to 65%.   Wall motion was normal; there were no regional wall motion   abnormalities. There was an increased relative contribution of   atrial contraction to ventricular filling. Doppler parameters are   consistent with abnormal left ventricular relaxation (grade 1   diastolic dysfunction). - Aortic valve: Severe focal calcification involving the left   coronary and noncoronary cusp. Noncoronary cusp mobility was   severely restricted. - Mitral valve: Calcified annulus. - Atrial septum: There was increased thickness of the septum,   consistent with lipomatous hypertrophy. - Pulmonary arteries: Systolic pressure could not be accurately    estimated.   Neuro/Psych PSYCHIATRIC DISORDERS Anxiety Depression CVA    GI/Hepatic Neg liver ROS, GERD  Medicated and Controlled,  Endo/Other  diabetes  Renal/GU negative Renal ROS  negative genitourinary   Musculoskeletal negative musculoskeletal ROS (+)   Abdominal   Peds negative pediatric ROS (+)  Hematology negative hematology ROS (+)   Anesthesia Other Findings   Reproductive/Obstetrics negative OB ROS                            Anesthesia Physical Anesthesia Plan  ASA: III  Anesthesia Plan: General   Post-op Pain Management:    Induction: Intravenous  PONV Risk Score and Plan: 2 and Ondansetron and Dexamethasone  Airway Management Planned: Oral ETT  Additional Equipment:   Intra-op Plan:   Post-operative Plan: Extubation in OR  Informed Consent: I have reviewed the patients History and Physical, chart, labs and discussed the procedure including the risks, benefits and alternatives for the proposed anesthesia with the patient or authorized representative who has indicated his/her understanding and acceptance.     Dental advisory given and Dental Advisory Given  Plan Discussed with: CRNA and Anesthesiologist  Anesthesia Plan Comments:        Anesthesia Quick Evaluation

## 2019-06-02 NOTE — Progress Notes (Signed)
  Progress Note    06/02/2019 7:34 AM 2 Days Post-Op  Subjective: No overnight issues  Vitals:   06/02/19 0436 06/02/19 0437  BP: 112/62 112/62  Pulse: 67 70  Resp: 18 18  Temp: 98.1 F (36.7 C)   SpO2: 98% 98%    Physical Exam: Awake alert oriented On the respirations Bilateral groins are soft without hematoma Right lower extremity dressing clean dry intact  CBC    Component Value Date/Time   WBC 10.2 06/02/2019 0056   RBC 2.68 (L) 06/02/2019 0056   HGB 8.6 (L) 06/02/2019 0056   HCT 25.8 (L) 06/02/2019 0056   PLT 254 06/02/2019 0056   MCV 96.3 06/02/2019 0056   MCV 96.7 12/23/2016 1549   MCH 32.1 06/02/2019 0056   MCHC 33.3 06/02/2019 0056   RDW 12.6 06/02/2019 0056   LYMPHSABS 2.3 05/27/2019 1059   MONOABS 0.8 05/27/2019 1059   EOSABS 0.1 05/27/2019 1059   BASOSABS 0.1 05/27/2019 1059    BMET    Component Value Date/Time   NA 132 (L) 06/01/2019 0154   NA 131 (L) 09/30/2017 1629   K 4.4 06/01/2019 0154   CL 102 06/01/2019 0154   CO2 21 (L) 06/01/2019 0154   GLUCOSE 121 (H) 06/01/2019 0154   BUN 14 06/01/2019 0154   BUN 13 09/30/2017 1629   CREATININE 0.90 06/01/2019 0154   CREATININE 1.14 07/12/2015 1220   CALCIUM 9.0 06/01/2019 0154   GFRNONAA >60 06/01/2019 0154   GFRNONAA 69 07/12/2015 1220   GFRAA >60 06/01/2019 0154   GFRAA 79 07/12/2015 1220    INR    Component Value Date/Time   INR 1.01 10/23/2016 0528     Intake/Output Summary (Last 24 hours) at 06/02/2019 0734 Last data filed at 06/02/2019 0634 Gross per 24 hour  Intake 1077.39 ml  Output -  Net 1077.39 ml     Assessment/plan:  68 y.o. male is here with gangrenous changes right great toe.  Appears to have nonflow limiting aortoiliac occlusive disease.  SFA on the right flush occluded has possible previous bypass in the right groin after cardiac catheterization.  Will need right common femoral to below-knee popliteal artery bypass today.  There is no suitable vein.  We  discussed toe amputation as well will likely proceed with this today pending outcome of bypass surgery.   Kalene Cutler C. Donzetta Matters, MD [facility] Office: [phone] Pager: [phone]  06/02/2019 7:34 AM

## 2019-06-02 NOTE — Progress Notes (Addendum)
CBG 75 mg/dl at 0640 am. Pt's been NPO after midnight, he is asymptomatic. Vital signs remain stable. No acute distress noted. Will wait for MD morning round at [facility] Brently Voorhis,RN

## 2019-06-02 NOTE — Transfer of Care (Signed)
Immediate Anesthesia Transfer of Care Note  Patient: Jason [name]  Procedure(s) Performed: RIGHT LEG BYPASS GRAFT FEMORAL-POPLITEAL ARTERY using Gore Propaten Vascular Graft Removable Ring (Right Leg Upper) Patch Angioplasty using Hemashield Platinum Finesse Patch of the External Iliac Femoral Artery and Profunda (Right Groin) Endarterectomy External Iliac  Femoral Artery and Profunda (Right Groin) Amputation Right Great Toe and Second Toe (Right Toe)  Patient Location: PACU  Anesthesia Type:General  Level of Consciousness: oriented, drowsy and patient cooperative  Airway & Oxygen Therapy: Patient Spontanous Breathing and Patient connected to face mask oxygen  Post-op Assessment: Report given to RN and Post -op Vital signs reviewed and stable  Post vital signs: Reviewed  Last Vitals:  Vitals Value Taken Time  BP 120/49 06/02/19 1556  Temp 37.4 C 06/02/19 1556  Pulse 73 06/02/19 1608  Resp 25 06/02/19 1608  SpO2 92 % 06/02/19 1608  Vitals shown include unvalidated device data.  Last Pain:  Vitals:   06/02/19 1556  TempSrc:   PainSc: Asleep      Patients Stated Pain Goal: 0 ([id number])  Complications: No apparent anesthesia complications

## 2019-06-02 NOTE — Progress Notes (Signed)
    Right foot dressing with minimal bloody drainage distal dressing, not saturated. Doppler signal peroneal right LE Groin soft without hematoma.   S/P right fem-pop bypass Disposition stable Will restart ASA and Plavix. Will give 24 hours of antibiotics then D/C  Roxy Horseman PA-C

## 2019-06-02 NOTE — Progress Notes (Signed)
Pharmacy Antibiotic Note  Jason [name] is a 68 y.o. male on day # 6 antibiotics for wound infection/cellulitis. Currently on Vancomycin, Cefepime and Flagyl. S/p vascular procedures and amputation of 2 toes today.   On Vancomycin 1500 mg IV q24hrs at 12am.  Vanc trough level last night = 13 mcg/ml, peak level this am = 38 mcg/ml. AUC calculated 562.  Target 400-550.  Plan: Decrease Vancomycin to 1250 mg IV q24hrs for calc'd AUC 468 Continue Cefepime 2gm IV q8hrs Also on Metronidazole 500 mg IV q8h per MD Follow renal function. Noted plan to stop antibiotics 24hrs post-op.  Height: 5' 9.02" (175.3 cm) Weight: 166 lb 11.2 oz (75.6 kg) IBW/kg (Calculated) : 70.74  Temp (24hrs), Avg:98.2 F (36.8 C), Min:97.6 F (36.4 C), Max:99.3 F (37.4 C)  Recent Labs  Lab 05/27/19 1059 05/28/19 0530 05/29/19 1216 05/30/19 0509 05/31/19 0616 06/01/19 0154 06/01/19 2340 06/02/19 0056 06/02/19 0335 06/02/19 1358  WBC 13.6* 11.0*  --  9.7 7.9 10.6*  --  10.2  --   --   CREATININE 0.88 1.02 1.23 1.13 1.14 0.90  --   --   --  0.80  LATICACIDVEN 1.8  --   --   --   --   --   --   --   --   --   VANCOPEAK  --   --   --   --   --   --  13*  --  38  --     Estimated Creatinine Clearance: 90.8 mL/min (by C-G formula based on SCr of 0.8 mg/dL).    Allergies  Allergen Reactions  . Coreg [Carvedilol] Nausea And Vomiting and Other (See Comments)    Per patient made him dizzy and light sensitive  . Sulfa Antibiotics Nausea And Vomiting and Other (See Comments)    Also headaches     Antimicrobials this admission:  Vancomycin 12/4 >>  Zosyn 12/4>>12/6  Cefepime 12/6 >>  Metronidazole 12/6 >>  Dose adjustments this admission: 12/6: vancomycin 1750 q24h --> 1500 q24h, reduced d/t renal function 12/10: VP 38, VT 13 - AUC 562 on 1500 mg IV q24h > decr 1250 mg IV q24h for AUC 468  Microbiology results: 12/4 blood x 2: negative 12/4 COVID: negative 12/10 MRSA PCR: positive for Staph,  negative for MRSA  Thank you for allowing pharmacy to be a part of this patient's care.  Arty Baumgartner, [place] Pager: [phone] or phone: [phone] 06/02/2019 4:59 PM

## 2019-06-03 ENCOUNTER — Inpatient Hospital Stay (HOSPITAL_COMMUNITY): Payer: Medicare Other

## 2019-06-03 DIAGNOSIS — M79609 Pain in unspecified limb: Secondary | ICD-10-CM

## 2019-06-03 LAB — GLUCOSE, CAPILLARY
Glucose-Capillary: 99 mg/dL (ref 70–99)
Glucose-Capillary: 168 mg/dL — ABNORMAL HIGH (ref 70–99)
Glucose-Capillary: 182 mg/dL — ABNORMAL HIGH (ref 70–99)
Glucose-Capillary: 130 mg/dL — ABNORMAL HIGH (ref 70–99)

## 2019-06-03 LAB — TYPE AND SCREEN
ABO/RH(D): O POS
Antibody Screen: NEGATIVE
Unit division: 0
Unit division: 0

## 2019-06-03 LAB — CBC
HCT: 26.9 % — ABNORMAL LOW (ref 39.0–52.0)
Hemoglobin: 8.8 g/dL — ABNORMAL LOW (ref 13.0–17.0)
MCH: 32.1 pg (ref 26.0–34.0)
MCHC: 32.7 g/dL (ref 30.0–36.0)
MCV: 98.2 fL (ref 80.0–100.0)
Platelets: 212 10*3/uL (ref 150–400)
RBC: 2.74 MIL/uL — ABNORMAL LOW (ref 4.22–5.81)
RDW: 13.2 % (ref 11.5–15.5)
WBC: 13.8 10*3/uL — ABNORMAL HIGH (ref 4.0–10.5)
nRBC: 0 % (ref 0.0–0.2)

## 2019-06-03 LAB — CBC WITH DIFFERENTIAL/PLATELET
Abs Immature Granulocytes: 0.08 10*3/uL — ABNORMAL HIGH (ref 0.00–0.07)
Basophils Absolute: 0 10*3/uL (ref 0.0–0.1)
Basophils Relative: 0 %
Eosinophils Absolute: 0.1 10*3/uL (ref 0.0–0.5)
Eosinophils Relative: 1 %
HCT: 26.1 % — ABNORMAL LOW (ref 39.0–52.0)
Hemoglobin: 8.8 g/dL — ABNORMAL LOW (ref 13.0–17.0)
Immature Granulocytes: 1 %
Lymphocytes Relative: 9 %
Lymphs Abs: 1.2 10*3/uL (ref 0.7–4.0)
MCH: 32.7 pg (ref 26.0–34.0)
MCHC: 33.7 g/dL (ref 30.0–36.0)
MCV: 97 fL (ref 80.0–100.0)
Monocytes Absolute: 0.8 10*3/uL (ref 0.1–1.0)
Monocytes Relative: 6 %
Neutro Abs: 11.3 10*3/uL — ABNORMAL HIGH (ref 1.7–7.7)
Neutrophils Relative %: 83 %
Platelets: 192 10*3/uL (ref 150–400)
RBC: 2.69 MIL/uL — ABNORMAL LOW (ref 4.22–5.81)
RDW: 13.4 % (ref 11.5–15.5)
WBC: 13.5 10*3/uL — ABNORMAL HIGH (ref 4.0–10.5)
nRBC: 0 % (ref 0.0–0.2)

## 2019-06-03 LAB — BPAM RBC
Blood Product Expiration Date: 202101112359
Blood Product Expiration Date: 202101112359
ISSUE DATE / TIME: 202012101357
ISSUE DATE / TIME: 202012101357
Unit Type and Rh: 5100
Unit Type and Rh: 5100

## 2019-06-03 LAB — BASIC METABOLIC PANEL
Anion gap: 9 (ref 5–15)
Anion gap: 9 (ref 5–15)
BUN: 15 mg/dL (ref 8–23)
BUN: 15 mg/dL (ref 8–23)
CO2: 18 mmol/L — ABNORMAL LOW (ref 22–32)
CO2: 19 mmol/L — ABNORMAL LOW (ref 22–32)
Calcium: 8.2 mg/dL — ABNORMAL LOW (ref 8.9–10.3)
Calcium: 8.5 mg/dL — ABNORMAL LOW (ref 8.9–10.3)
Chloride: 105 mmol/L (ref 98–111)
Chloride: 107 mmol/L (ref 98–111)
Creatinine, Ser: 1.14 mg/dL (ref 0.61–1.24)
Creatinine, Ser: 1.28 mg/dL — ABNORMAL HIGH (ref 0.61–1.24)
GFR calc Af Amer: >60 mL/min (ref >60)
GFR calc Af Amer: >60 mL/min (ref >60)
GFR calc non Af Amer: 58 mL/min — ABNORMAL LOW (ref >60)
GFR calc non Af Amer: >60 mL/min (ref >60)
Glucose, Bld: 141 mg/dL — ABNORMAL HIGH (ref 70–99)
Glucose, Bld: 177 mg/dL — ABNORMAL HIGH (ref 70–99)
Potassium: 4.3 mmol/L (ref 3.5–5.1)
Potassium: 4.9 mmol/L (ref 3.5–5.1)
Sodium: 132 mmol/L — ABNORMAL LOW (ref 135–145)
Sodium: 135 mmol/L (ref 135–145)

## 2019-06-03 NOTE — Evaluation (Signed)
Physical Therapy Evaluation Patient Details Name: Jason [name] MRN: [id number] DOB: 12/15/1950 Today's Date: 06/03/2019   History of Present Illness  68 y.o. male s/p RLE revascularization and 1st and 2nd toe amputation (Heel WB with Darco shoe). Pt with PMH of DM, b/l Carotid artery disease with R ICA stent, occluded L ICA stent,CVA, COPD, CAD s/p CABG in 2011, and and NSTEMI and DES to sVF- OM2 in 2018, HTN, GERD, nicotine abuse.  Clinical Impression  Pt presents to PT with deficits in strength, balance, functional mobility, gait, ROM, endurance, and with significant RLE pain along with chronic back pain. Pt requires assistance for all OOB mobility at this time as well as verbal cues to maintain WB precautions. Pt with reduced activity tolerance 2/2 pain, significantly limiting his ability to perform ADLs and household level mobility. Pt will benefit from PT POC to improve activity tolerance and functional mobility, in order to restore independence.    Follow Up Recommendations SNF;Supervision/Assistance - 24 hour    Equipment Recommendations  Rolling walker with 5" wheels    Recommendations for Other Services       Precautions / Restrictions Precautions Precautions: Fall Restrictions Weight Bearing Restrictions: Yes RLE Weight Bearing: (heel WB) Other Position/Activity Restrictions: DARCO shoe not present for eval      Mobility  Bed Mobility Overal bed mobility: Needs Assistance Bed Mobility: Supine to Sit     Supine to sit: Supervision;HOB elevated        Transfers Overall transfer level: Needs assistance Equipment used: Rolling walker (2 wheeled) Transfers: Sit to/from Stand Sit to Stand: Min guard            Ambulation/Gait Ambulation/Gait assistance: Herbalist (Feet): 3 Feet Assistive device: Rolling walker (2 wheeled) Gait Pattern/deviations: Step-to pattern Gait velocity: reduced Gait velocity interpretation: <1.31 ft/sec,  indicative of household ambulator General Gait Details: pt with short step to gait with increased dorsiflexion of R ankle throughout gait cycle to maintain WB precautions  Stairs            Wheelchair Mobility    Modified Rankin (Stroke Patients Only)       Balance Overall balance assessment: Needs assistance Sitting-balance support: No upper extremity supported;Feet supported Sitting balance-Leahy Scale: Good Sitting balance - Comments: supervision   Standing balance support: Bilateral upper extremity supported Standing balance-Leahy Scale: Fair Standing balance comment: minG for static standing balance with BUE support                             Pertinent Vitals/Pain Pain Assessment: Faces Faces Pain Scale: Hurts even more Pain Location: RLE and back Pain Descriptors / Indicators: Aching Pain Intervention(s): Limited activity within patient's tolerance    Home Living Family/patient expects to be discharged to:: Private residence Living Arrangements: Alone Available Help at Discharge: Family;Available PRN/intermittently Type of Home: House Home Access: Stairs to enter Entrance Stairs-Rails: Psychiatric nurse of Steps: 7 Home Layout: One level Home Equipment: Other (comment)(TBD, pt reports brother may have some DME)      Prior Function Level of Independence: Independent         Comments: limited activity tolerance 2/2 chronic back pain     Hand Dominance        Extremity/Trunk Assessment   Upper Extremity Assessment Upper Extremity Assessment: Overall WFL for tasks assessed    Lower Extremity Assessment Lower Extremity Assessment: RLE deficits/detail;LLE deficits/detail RLE Deficits / Details: knee extension limited  by 10 degrees actively due to tightness and pain from incision. Grossly 4-/5 RLE RLE Sensation: decreased light touch LLE Sensation: decreased light touch    Cervical / Trunk Assessment Cervical /  Trunk Assessment: Normal  Communication   Communication: No difficulties  Cognition Arousal/Alertness: Awake/alert Behavior During Therapy: WFL for tasks assessed/performed Overall Cognitive Status: Within Functional Limits for tasks assessed                                        General Comments General comments (skin integrity, edema, etc.): VSS    Exercises     Assessment/Plan    PT Assessment Patient needs continued PT services  PT Problem List Decreased strength;Decreased range of motion;Decreased activity tolerance;Decreased balance;Decreased mobility;Decreased knowledge of use of DME;Decreased safety awareness;Decreased knowledge of precautions;Impaired sensation;Pain       PT Treatment Interventions DME instruction;Gait training;Functional mobility training;Stair training;Therapeutic activities;Therapeutic exercise;Balance training;Neuromuscular re-education;Patient/family education    PT Goals (Current goals can be found in the Care Plan section)  Acute Rehab PT Goals Patient Stated Goal: To return to independence PT Goal Formulation: With patient Time For Goal Achievement: 06/17/19 Potential to Achieve Goals: Good    Frequency Min 2X/week   Barriers to discharge        Co-evaluation               AM-PAC PT "6 Clicks" Mobility  Outcome Measure Help needed turning from your back to your side while in a flat bed without using bedrails?: None Help needed moving from lying on your back to sitting on the side of a flat bed without using bedrails?: None Help needed moving to and from a bed to a chair (including a wheelchair)?: A Little Help needed standing up from a chair using your arms (e.g., wheelchair or bedside chair)?: A Little Help needed to walk in hospital room?: A Lot Help needed climbing 3-5 steps with a railing? : Total 6 Click Score: 17    End of Session Equipment Utilized During Treatment: (none) Activity Tolerance: Patient  limited by pain Patient left: in chair;with call bell/phone within reach Nurse Communication: Mobility status PT Visit Diagnosis: Other abnormalities of gait and mobility (R26.89);Pain;Muscle weakness (generalized) (M62.81) Pain - Right/Left: Right Pain - part of body: Knee    Time: [id number] PT Time Calculation (min) (ACUTE ONLY): 24 min   Charges:   PT Evaluation $PT Eval Moderate Complexity: 1 Mod          Zenaida Niece, PT, DPT Acute Rehabilitation Pager: [phone]   Zenaida Niece 06/03/2019, 12:34 PM

## 2019-06-03 NOTE — Progress Notes (Signed)
Right lower ext venous  has been completed. Refer to [facility] under chart review to view preliminary results.   06/03/2019  11:20 AM Mikael Skoda, Bonnye Fava

## 2019-06-03 NOTE — Progress Notes (Signed)
[facility]                                                                              Patient Demographics  Jason [name], is a 68 y.o. male, DOB - Oct 23, 1950, TEL:[id number]  Admit date - 05/27/2019   Admitting Physician Georgette Shell, MD  Outpatient Primary MD for the patient is Patient, [name]  Outpatient specialists:   LOS - 7  days   Medical records reviewed and are as summarized below:  Chief Complaint  Patient presents with  . Foot Pain       Brief summary   KARO ROG is a 68 y.o. male  presented from [place] [facility] with medical history of DM, not on medications for 1 month, b/l Carotid artery disease with R ICA stent, occluded L ICA stent,CVA, COPD, CAD s/p CABG in 2011, and and NSTEMI and DES to sVF- OM2 in 2018, HTN, GERD, nicotine abuse presented with about 4 days of right foot pain followed by a change in color of his 1st toe to black. He admits to fever, chills, nausea and vomiting. He reported subjective fevers/ chill about 1 wk ago. The foot then became red and painful and the toe then became purple and black. His pain is moderate and worse when walking, radiates up his leg. He has had nausea and occasional vomiting for 2 days. He has not been able to pick up his medications in at least 1 month as he lives in [place] and his pharmacy is in [place].  In ED sodium 125, CO2 18, WBCs 13.6.  Right foot x-ray showed no osteomyelitis or gas.  He was admitted under hospital service due to sepsis secondary to right foot cellulitis/gangrene of the first toe with history of PVD.  Blood culture negative to date.  He was started on Zosyn and vancomycin.  He was seen by vascular surgery and they recommended aortogram for which patient was transferred to [facility] on 05/29/2019.  Underwent ultrasound-guided cannulation of the left and right common femoral artery, aortogram with bilateral lower extremity runoff on 05/31/2019.  Findings below  per vascular surgeon note "The aorta and iliac segments are heavily diseased but there does appear to be nonflow-limiting stenosis to the both common femoral arteries.  On the right side which is the site of issue he possibly has a bypass to the profunda the SFA is flush occluded.  He reconstitutes above-knee popliteal.  Appears to have runoff via the anterior tibial and peroneal arteries.  On the left side he has a very diseased SFA.  Appears to occlude his tibioperoneal trunk and has runoff to the ankle via peroneal artery".  Patient underwent following procedures on 06/02/2019. 1.  Right external iliac common femoral and profundofemoral endarterectomy with Dacron patch angioplasty 2.  Right common femoral to below-knee popliteal artery bypass with 6 mm ringed PTFE 3.  Amputation right first and second toes  Assessment & Plan    Principal Problem: Sepsis with right foot cellulitis, gangrene of the right first toe with history of PVD, right femoral endarterectomy 2004 -Met sepsis criteria. Blood cultures no  growth so far. Given underlying history of PVD, vascular surgery consulted. Patient was seen by Dr. Scot Dock,, recommended to transfer to [facility] for arteriogram and may need amputation of the right great toe after that.  He underwent aortogram which showed severe multivessel peripheral arterial disease. Patient underwent following procedures on 06/02/2019. 1.  Right external iliac common femoral and profundofemoral endarterectomy with Dacron patch angioplasty 2.  Right common femoral to below-knee popliteal artery bypass with 6 mm ringed PTFE 3.  Amputation right first and second toes Per vascular surgery note "DC antibiotics" but antibiotics still with active order.  Will DC them.    Active Problems: Diabetes mellitus type 2, uncontrolled with hyperglycemia, noncompliant, with right foot cellulitis, gangrene -Patient reports that he ran out of his Glucophage over a month ago, used to be  on insulin in the past -Hemoglobin A1c 8.5, Blood sugar controlled. Continue 10 units of Lantus and SSI.  Hyponatremia, has also underlying chronic hyponatremia.  Stable around 132.  History of coronary disease s/p CABG, stent -Continue Plavix, aspirin, metoprolol, statin  Prolonged QTC, trigeminy -QTC 532, EKG changes, extensive cardiac history, continue telemetry, repeat EKG on 06/01/2019 shows QTc of 506.  Hypothyroidism -TSH 5.18, free T4 0.8, ran out of Synthroid, resumed Synthroid 50 mcg.  Depression Ran out of Zoloft, resumed.  Monitor sodium closely.  Essential hypertension Blood pressure fairly stable.,  Continue to hold Imdur, and continue decreased dose of Toprol-XL to 25 mg daily  Nicotine use Patient counseled, continue nicotine patch  Right lower extremity swelling: We will order Doppler ultrasound to rule out DVT.  Code Status: Partial (yes to everything but no intubation) DVT Prophylaxis:  Lovenox  Family Communication: No family present.  Discussed plan of care with patient. Disposition Plan: To be determined based on further vascular work-up and will be discharged once cleared by vascular surgery.  PT recommends SNF.  Time Spent in minutes 24 minutes  Procedures:  None  Consultants:   Vascular surgery, Dr. Scot Dock  Antimicrobials:   Anti-infectives (From admission, onward)   Start     Dose/Rate Route Frequency Ordered Stop   06/03/19 0000  vancomycin (VANCOCIN) 1,250 mg in sodium chloride 0.9 % 250 mL IVPB     1,250 mg 166.7 mL/hr over 90 Minutes Intravenous Every 24 hours 06/02/19 1659     05/29/19 2200  vancomycin (VANCOCIN) 1,500 mg in sodium chloride 0.9 % 500 mL IVPB  Status:  Discontinued     1,500 mg 250 mL/hr over 120 Minutes Intravenous Every 24 hours 05/29/19 1419 06/02/19 1659   05/29/19 2200  metroNIDAZOLE (FLAGYL) IVPB 500 mg     500 mg 100 mL/hr over 60 Minutes Intravenous Every 8 hours 05/29/19 1427     05/29/19 2200  ceFEPIme  (MAXIPIME) 2 g in sodium chloride 0.9 % 100 mL IVPB     2 g 200 mL/hr over 30 Minutes Intravenous Every 8 hours 05/29/19 1428     05/28/19 2200  vancomycin (VANCOCIN) 1,750 mg in sodium chloride 0.9 % 500 mL IVPB  Status:  Discontinued     1,750 mg 250 mL/hr over 120 Minutes Intravenous Every 24 hours 05/28/19 0610 05/29/19 1419   05/28/19 1000  vancomycin (VANCOCIN) 1,750 mg in sodium chloride 0.9 % 500 mL IVPB  Status:  Discontinued     1,750 mg 250 mL/hr over 120 Minutes Intravenous Every 24 hours 05/27/19 1949 05/28/19 0610   05/27/19 2000  piperacillin-tazobactam (ZOSYN) IVPB 3.375 g  Status:  Discontinued  3.375 g 12.5 mL/hr over 240 Minutes Intravenous Every 8 hours 05/27/19 1500 05/27/19 1941   05/27/19 2000  piperacillin-tazobactam (ZOSYN) IVPB 3.375 g  Status:  Discontinued     3.375 g 12.5 mL/hr over 240 Minutes Intravenous Every 8 hours 05/27/19 1941 05/29/19 1427   05/27/19 1800  piperacillin-tazobactam (ZOSYN) IVPB 3.375 g  Status:  Discontinued     3.375 g 100 mL/hr over 30 Minutes Intravenous  Once 05/27/19 1748 05/27/19 1941   05/27/19 1800  vancomycin (VANCOCIN) 1,500 mg in sodium chloride 0.9 % 500 mL IVPB     1,500 mg 250 mL/hr over 120 Minutes Intravenous  Once 05/27/19 1748 05/28/19 0543   05/27/19 1715  piperacillin-tazobactam (ZOSYN) IVPB 3.375 g  Status:  Discontinued     3.375 g 100 mL/hr over 30 Minutes Intravenous Every 6 hours 05/27/19 1212 05/27/19 1500   05/27/19 1215  piperacillin-tazobactam (ZOSYN) IVPB 3.375 g  Status:  Discontinued     3.375 g 100 mL/hr over 30 Minutes Intravenous Every 6 hours 05/27/19 1208 05/27/19 1212   05/27/19 1100  piperacillin-tazobactam (ZOSYN) IVPB 3.375 g     3.375 g 100 mL/hr over 30 Minutes Intravenous  Once 05/27/19 1055 05/27/19 1155         Medications  Scheduled Meds: . aspirin EC  81 mg Oral Daily  . clopidogrel  75 mg Oral Daily  . docusate sodium  100 mg Oral Daily  . enoxaparin (LOVENOX) injection   40 mg Subcutaneous Q24H  . insulin aspart  0-15 Units Subcutaneous TID WC  . insulin aspart  0-5 Units Subcutaneous QHS  . insulin glargine  15 Units Subcutaneous QHS  . levothyroxine  50 mcg Oral Daily  . metoprolol succinate  25 mg Oral Daily  . nicotine  21 mg Transdermal Q24H  . pantoprazole  40 mg Oral Daily  . rosuvastatin  10 mg Oral Daily  . sertraline  250 mg Oral QHS   Continuous Infusions: . sodium chloride 10 mL/hr at 06/01/19 2143  . sodium chloride    . sodium chloride 125 mL/hr at 06/02/19 2211  . ceFEPime (MAXIPIME) IV 2 g (06/03/19 0620)  . magnesium sulfate bolus IVPB    . metronidazole 500 mg (06/03/19 0722)  . vancomycin 1,250 mg (06/03/19 0009)   PRN Meds:.sodium chloride, sodium chloride, acetaminophen **OR** acetaminophen, albuterol, alum & mag hydroxide-simeth, bisacodyl, famotidine, guaiFENesin-dextromethorphan, hydrALAZINE, HYDROcodone-acetaminophen, HYDROmorphone (DILAUDID) injection, magnesium sulfate bolus IVPB, ondansetron, oxyCODONE-acetaminophen, phenol, potassium chloride, senna-docusate, sodium phosphate      Subjective:   Seen and examined.  Continues to complain of right foot pain but better than yesterday.  No other complaint. Objective:   Vitals:   06/02/19 1611 06/02/19 1626 06/02/19 2033 06/03/19 0513  BP: (!) 119/50 (!) 116/56 130/70 (!) 112/52  Pulse: 74 78 63 66  Resp: '20 20 19 17  '$ Temp:  98.1 F (36.7 C) 98.2 F (36.8 C) 98.1 F (36.7 C)  TempSrc:   Oral Oral  SpO2: 92% 98% 98% 99%  Weight:      Height:        Intake/Output Summary (Last 24 hours) at 06/03/2019 1146 Last data filed at 06/03/2019 0825 Gross per 24 hour  Intake 2850 ml  Output 1625 ml  Net 1225 ml     Wt Readings from Last 3 Encounters:  06/02/19 75.6 kg  03/02/19 77.2 kg  05/06/18 74.1 kg   Physical Exam General exam: Appears calm and comfortable  Respiratory system: Clear to auscultation. Respiratory  effort normal. Cardiovascular system: S1 &  S2 heard, RRR. No JVD, murmurs, rubs, gallops or clicks.  +1-2 pitting edema bilateral lower extremities with right calf tenderness. Gastrointestinal system: Abdomen is nondistended, soft and nontender. No organomegaly or masses felt. Normal bowel sounds heard. Central nervous system: Alert and oriented. No focal neurological deficits. Extremities: Symmetric 5 x 5 power. Skin: Dressing in place in right foot.  Direct visualization deferred. Psychiatry: Judgement and insight appear normal. Mood & affect appropriate.         Data Reviewed:  I have personally reviewed following labs and imaging studies  Micro Results Recent Results (from the past 240 hour(s))  Culture, blood (routine x 2)     Status: None   Collection Time: 05/27/19 10:45 AM   Specimen: BLOOD  Result Value Ref Range Status   Specimen Description BLOOD LEFT ANTECUBITAL  Final   Special Requests   Final    BOTTLES DRAWN AEROBIC ONLY Blood Culture results may not be optimal due to an inadequate volume of blood received in culture bottles   Culture   Final    NO GROWTH 5 DAYS Performed at [facility], [place] [address], [place], [place] [address]    Report Status 06/01/2019 FINAL  Final  Culture, blood (routine x 2)     Status: None   Collection Time: 05/27/19 11:00 AM   Specimen: BLOOD RIGHT WRIST  Result Value Ref Range Status   Specimen Description   Final    BLOOD RIGHT WRIST Performed at [facility], [address] [place], [place] [address]    Special Requests   Final    BOTTLES DRAWN AEROBIC AND ANAEROBIC Blood Culture adequate volume Performed at [facility], [address], [place], [place] [address]    Culture   Final    NO GROWTH 5 DAYS Performed at [facility], [place] [address] [place], [place] [address]    Report Status 06/01/2019 FINAL  Final  SARS CORONAVIRUS 2 (TAT 6-24 HRS) Nasopharyngeal Nasopharyngeal Swab     Status: None   Collection Time: 05/27/19 11:08  AM   Specimen: Nasopharyngeal Swab  Result Value Ref Range Status   SARS Coronavirus 2 NEGATIVE NEGATIVE Final    Comment: (NOTE) SARS-CoV-2 target nucleic acids are NOT DETECTED. The SARS-CoV-2 RNA is generally detectable in upper and lower respiratory specimens during the acute phase of infection. Negative results do not preclude SARS-CoV-2 infection, do not rule out co-infections with other pathogens, and should not be used as the sole basis for treatment or other patient management decisions. Negative results must be combined with clinical observations, patient history, and epidemiological information. The expected result is Negative. Fact Sheet for Patients: SugarRoll.be Fact Sheet for Healthcare Providers: https://www.woods-mathews.com/ This test is not yet approved or cleared by the [place] FDA and  has been authorized for detection and/or diagnosis of SARS-CoV-2 by FDA under an Emergency Use Authorization (EUA). This EUA will remain  in effect (meaning this test can be used) for the duration of the COVID-19 declaration under Section 56 4(b)(1) of the Act, 21 U.S.C. section 360bbb-3(b)(1), unless the authorization is terminated or revoked sooner. Performed at [facility], [place] [address], [place], [place] [address]   Surgical pcr screen     Status: Abnormal   Collection Time: 06/02/19  4:38 AM   Specimen: Nasal Mucosa; Nasal Swab  Result Value Ref Range Status   MRSA, PCR NEGATIVE NEGATIVE  Final   Staphylococcus aureus POSITIVE (A) NEGATIVE Final    Comment: (NOTE) The Xpert SA Assay (FDA approved for NASAL specimens in patients 2 years of age and older), is one component of a comprehensive surveillance program. It is not intended to diagnose infection nor to guide or monitor treatment. Performed at [facility], [place] [address] [place], [place] [address]     Radiology Reports DG Chest 1 View  Result  Date: 05/27/2019 CLINICAL DATA:  Cellulitis. EXAM: CHEST  1 VIEW COMPARISON:  August 01, 2017. FINDINGS: The heart size and mediastinal contours are within normal limits. Both lungs are clear. Status post coronary bypass graft. The visualized skeletal structures are unremarkable. IMPRESSION: No active disease. Electronically Signed   By: Marijo Conception M.D.   On: 05/27/2019 11:34   PERIPHERAL VASCULAR CATHETERIZATION  Result Date: 05/31/2019 Patient name: DERRELL MILANES MRN: [id number] DOB: [birth year] Sex: male 05/31/2019 Pre-operative Diagnosis: Critical right lower extremity ischemia with great toe ulceration Post-operative diagnosis:  Same Surgeon:  Erlene Quan C. Donzetta Matters, MD Procedure Performed: 1.  Ultrasound-guided cannulation left common femoral artery 2.  Ultrasound-guided cannulation right common femoral artery 3.  Aortogram with bilateral lower extremity runoff 4.  Moderate sedation with fentanyl and Versed for Indications: 68 year old male here with great toe ulceration on the right.  He has severely depressed ABIs bilaterally.  Has had previous intervention on the right common femoral artery after cardiac catheterization.  Has possibly had greater saphenous vein harvested on the right as well. Findings: I cannot access from the left side because a dissection plane.  From the right side I was able to access.  The aorta and iliac segments are heavily diseased but there does appear to be nonflow-limiting stenosis to the both common femoral arteries.  On the right side which is the site of issue he possibly has a bypass to the profunda the SFA is flush occluded.  He reconstitutes above-knee popliteal.  Appears to have runoff via the anterior tibial and peroneal arteries.  On the left side he has a very diseased SFA.  Appears to occlude his tibioperoneal trunk and has runoff to the ankle via peroneal artery. We will consider patient for right common femoral to popliteal artery bypass.  He will be vein mapped  for bilateral greater saphenous veins.  Procedure:  The patient was identified in the holding area and taken to room 8.  The patient was then placed supine on the table and prepped and draped in the usual sterile fashion.  A time out was called.  Ultrasound was used to evaluate the left common femoral artery.  This was heavily diseased.  The cannulated 1 healthy area with micropuncture needle followed by wire and sheath.  Images saved the permanent record.  The wire would not pass I did use fluoroscopy to get it to pass further.  I placed a micropuncture sheath.  I then used a stiff angled Glidewire.  Unfortunately was in a subintimal plane.  I placed a 5 [place] sheath 10 disease.  Catheter to reenter but could not.  I then used ultrasound to identify the common femoral artery on the right.  This also appeared to be heavily diseased there was scar tissue was possibly a bypass in place.  I cannulated this with direct ultrasound visualization images saved the permanent record.  I did get a Bentson wire to pass.  I then performed retrograde angiography with hand-injection to confirm I was intraluminal in the aorta.  I  then placed a Omni catheter to the level of L1 performed aortogram followed bilateral lower extremity runoff with the above findings.  Patient will be considered for right common femoral to popliteal artery bypass.  We will vein map him.  He will likely need toe amputation as well.  Catheter was removed over wire.  Sheath will be pulled in postoperative holding.  He tolerated procedure well without immediate complication. Contrast: 100cc Brandon C. Donzetta Matters, MD [facility] Office: [phone] Pager: [phone]   DG Foot Complete Right  Result Date: 05/27/2019 CLINICAL DATA:  Cellulitis.  Right foot swelling. EXAM: RIGHT FOOT COMPLETE - 3+ VIEW COMPARISON:  None. FINDINGS: There is no evidence of fracture or dislocation. There is no evidence of arthropathy or other focal  bone abnormality. No lytic destruction is seen to suggest osteomyelitis. Soft tissues are unremarkable. IMPRESSION: Negative. Electronically Signed   By: Marijo Conception M.D.   On: 05/27/2019 11:36   VAS [place] ABI WITH/WO TBI  Result Date: 05/30/2019 LOWER EXTREMITY DOPPLER STUDY Indications: Ulceration, gangrene, and peripheral artery disease. High Risk Factors: Hypertension, hyperlipidemia, Diabetes, current smoker,                    coronary artery disease.  Vascular Interventions: Right ICA stent 03/28/15, CABG 2011. Comparison Study: No prior study on file for comparison Performing Technologist: Sharion Dove RVS  Examination Guidelines: A complete evaluation includes at minimum, Doppler waveform signals and systolic blood pressure reading at the level of bilateral brachial, anterior tibial, and posterior tibial arteries, when vessel segments are accessible. Bilateral testing is considered an integral part of a complete examination. Photoelectric Plethysmograph (PPG) waveforms and toe systolic pressure readings are included as required and additional duplex testing as needed. Limited examinations for reoccurring indications may be performed as noted.  ABI Findings: +---------+------------------+-----+-------------------+------------------+ Right    Rt Pressure (mmHg)IndexWaveform           Comment            +---------+------------------+-----+-------------------+------------------+ Brachial 135                    triphasic                             +---------+------------------+-----+-------------------+------------------+ PTA      54                0.40 dampened monophasic                   +---------+------------------+-----+-------------------+------------------+ DP       40                0.30 monophasic                            +---------+------------------+-----+-------------------+------------------+ Great Toe                                           bandage/ulceration +---------+------------------+-----+-------------------+------------------+ +---------+------------------+-----+-------------------+-------+ Left     Lt Pressure (mmHg)IndexWaveform           Comment +---------+------------------+-----+-------------------+-------+ Brachial 129                    triphasic                  +---------+------------------+-----+-------------------+-------+  PTA      38                0.28 dampened monophasic        +---------+------------------+-----+-------------------+-------+ DP       33                0.24 dampened monophasic        +---------+------------------+-----+-------------------+-------+ Great Toe0                 0.00                    absent  +---------+------------------+-----+-------------------+-------+ +-------+-----------+-----------+------------+------------+ ABI/TBIToday's ABIToday's TBIPrevious ABIPrevious TBI +-------+-----------+-----------+------------+------------+ Right  0.4        wound                               +-------+-----------+-----------+------------+------------+ Left   0.2        absent                              +-------+-----------+-----------+------------+------------+  Summary: Right: Resting right ankle-brachial index indicates severe right lower extremity arterial disease. Left: Resting left ankle-brachial index indicates critical left limb ischemia. The left toe-brachial index is abnormal.  *See table(s) above for measurements and observations.  Electronically signed by Servando Snare MD on 05/30/2019 at 5:01:14 PM.    Final    VAS [place] LOWER EXTREMITY SAPHENOUS VEIN MAPPING  Result Date: 06/01/2019 LOWER EXTREMITY VEIN MAPPING Indications:       ulceration Other Indications: Occluded right lower extremity bypass graft.  Comparison Study: No priors. Performing Technologist: Oda Cogan RDMS, RVT  Examination Guidelines: A complete evaluation includes B-mode imaging,  spectral Doppler, color Doppler, and power Doppler as needed of all accessible portions of each vessel. Bilateral testing is considered an integral part of a complete examination. Limited examinations for reoccurring indications may be performed as noted. +--------------+----------------+-------------------+--------------+-----------+  RT Diameter    RT Findings           GSV         LT Diameter  LT Findings      (cm)                                             (cm)                 +--------------+----------------+-------------------+--------------+-----------+      0.51                       Saphenofemoral        0.58                                                    Junction                                +--------------+----------------+-------------------+--------------+-----------+      0.37                       Proximal thigh  0.59                 +--------------+----------------+-------------------+--------------+-----------+                not visualized      Mid thigh          0.37                                and harvested                                               +--------------+----------------+-------------------+--------------+-----------+                not visualized    Distal thigh         0.43                                and harvested                                               +--------------+----------------+-------------------+--------------+-----------+                not visualized        Knee             0.29                 +--------------+----------------+-------------------+--------------+-----------+      0.26        branching         Prox calf          0.24      branching  +--------------+----------------+-------------------+--------------+-----------+      0.21        branching         Mid calf           0.26                  +--------------+----------------+-------------------+--------------+-----------+      0.24                         Distal calf         0.20                 +--------------+----------------+-------------------+--------------+-----------+      0.27                            Ankle            0.23                 +--------------+----------------+-------------------+--------------+-----------+ Diagnosing physician: Curt Jews MD Electronically signed by Curt Jews MD on 06/01/2019 at 8:44:51 PM.    Final    VAS [place] LOWER EXTREMITY VENOUS (DVT)  Result Date: 06/03/2019  Lower Venous Study Indications: Pain, and Status post right femoral -popliteal bypass graft using the GSV.  Comparison Study: No priors. Performing Technologist: Oda Cogan RDMS, RVT  Examination Guidelines: A complete evaluation includes B-mode imaging, spectral Doppler, color Doppler, and power Doppler as needed of all accessible portions of each vessel.  Bilateral testing is considered an integral part of a complete examination. Limited examinations for reoccurring indications may be performed as noted.  +---------+---------------+---------+-----------+----------+--------------+ RIGHT    CompressibilityPhasicitySpontaneityPropertiesThrombus Aging +---------+---------------+---------+-----------+----------+--------------+ CFV      Full           Yes      Yes                                 +---------+---------------+---------+-----------+----------+--------------+ SFJ      Full                                                        +---------+---------------+---------+-----------+----------+--------------+ FV Prox  Full                                                        +---------+---------------+---------+-----------+----------+--------------+ FV Mid   Full                                                        +---------+---------------+---------+-----------+----------+--------------+ FV  DistalFull                                                        +---------+---------------+---------+-----------+----------+--------------+ PFV      Full                                                        +---------+---------------+---------+-----------+----------+--------------+ POP      Full           Yes      Yes                                 +---------+---------------+---------+-----------+----------+--------------+ PTV      Full                                                        +---------+---------------+---------+-----------+----------+--------------+ PERO     Full                                                        +---------+---------------+---------+-----------+----------+--------------+   +----+---------------+---------+-----------+----------+--------------+ LEFTCompressibilityPhasicitySpontaneityPropertiesThrombus Aging +----+---------------+---------+-----------+----------+--------------+ CFV Full           Yes      Yes                                 +----+---------------+---------+-----------+----------+--------------+  SFJ Full                                                        +----+---------------+---------+-----------+----------+--------------+     Summary: Right: There is no evidence of deep vein thrombosis in the lower extremity. No cystic structure found in the popliteal fossa. Left: No evidence of common femoral vein obstruction.  *See table(s) above for measurements and observations.    Preliminary     Lab Data:  CBC: Recent Labs  Lab 05/31/19 0616 06/01/19 0154 06/02/19 0056 06/02/19 1358 06/02/19 1533 06/02/19 2332 06/03/19 1025  WBC 7.9 10.6* 10.2  --   --  13.8* 13.5*  NEUTROABS  --   --   --   --   --   --  11.3*  HGB 11.0* 9.4* 8.6* 6.8* 10.2* 8.8* 8.8*  HCT 32.8* 28.5* 25.8* 20.0* 30.0* 26.9* 26.1*  MCV 96.2 96.3 96.3  --   --  98.2 97.0  PLT 268 268 254  --   --  212 240   Basic Metabolic  Panel: Recent Labs  Lab 05/30/19 0509 05/31/19 0616 06/01/19 0154 06/02/19 1358 06/02/19 1533 06/02/19 2332 06/03/19 1025  NA 133* 134* 132* 133* 134* 135 132*  K 4.4 4.8 4.4 4.6 5.3* 4.9 4.3  CL 102 103 102 104  --  107 105  CO2 21* 22 21*  --   --  19* 18*  GLUCOSE 192* 139* 121* 137*  --  141* 177*  BUN '15 15 14 11  '$ --  15 15  CREATININE 1.13 1.14 0.90 0.80  --  1.28* 1.14  CALCIUM 9.4 9.3 9.0  --   --  8.5* 8.2*   GFR: Estimated Creatinine Clearance: 63.7 mL/min (by C-G formula based on SCr of 1.14 mg/dL). Liver Function Tests: No results for input(s): AST, ALT, ALKPHOS, BILITOT, PROT, ALBUMIN in the last 168 hours. No results for input(s): LIPASE, AMYLASE in the last 168 hours. No results for input(s): AMMONIA in the last 168 hours. Coagulation Profile: No results for input(s): INR, PROTIME in the last 168 hours. Cardiac Enzymes: No results for input(s): CKTOTAL, CKMB, CKMBINDEX, TROPONINI in the last 168 hours. BNP (last 3 results) No results for input(s): PROBNP in the last 8760 hours. HbA1C: No results for input(s): HGBA1C in the last 72 hours. CBG: Recent Labs  Lab 06/02/19 1558 06/02/19 1827 06/02/19 2100 06/03/19 0614 06/03/19 1132  GLUCAP 161* 149* 141* 99 168*   Lipid Profile: No results for input(s): CHOL, HDL, LDLCALC, TRIG, CHOLHDL, LDLDIRECT in the last 72 hours. Thyroid Function Tests: No results for input(s): TSH, T4TOTAL, FREET4, T3FREE, THYROIDAB in the last 72 hours. Anemia Panel: No results for input(s): VITAMINB12, FOLATE, FERRITIN, TIBC, IRON, RETICCTPCT in the last 72 hours. Urine analysis:    Component Value Date/Time   COLORURINE YELLOW 07/08/2013 0226   APPEARANCEUR CLEAR 07/08/2013 0226   LABSPEC 1.006 07/08/2013 0226   PHURINE 6.0 07/08/2013 0226   GLUCOSEU NEGATIVE 07/08/2013 0226   HGBUR NEGATIVE 07/08/2013 0226   BILIRUBINUR NEGATIVE 07/08/2013 0226   KETONESUR NEGATIVE 07/08/2013 0226   PROTEINUR NEGATIVE 07/08/2013 0226    UROBILINOGEN 0.2 07/08/2013 0226   NITRITE NEGATIVE 07/08/2013 0226   LEUKOCYTESUR NEGATIVE 07/08/2013 0226     Darliss Cheney M.D. [facility] 06/03/2019, 11:46 AM   Call  night coverage person covering after 7pm

## 2019-06-03 NOTE — Evaluation (Signed)
Occupational Therapy Evaluation Patient Details Name: Jason [name] MRN: [id number] DOB: 09-Apr-1951 Today's Date: 06/03/2019    History of Present Illness 68 y.o. male s/p RLE revascularization and 1st and 2nd toe amputation (Heel WB with Darco shoe). Pt with PMH of DM, b/l Carotid artery disease with R ICA stent, occluded L ICA stent,CVA, COPD, CAD s/p CABG in 2011, and and NSTEMI and DES to sVF- OM2 in 2018, HTN, GERD, nicotine abuse.   Clinical Impression   Patient is a 68 year old male that lives alone in a single level home with 6 steps to enter. Patient is typically independence with self care and IADLs at baseline, however patient states when he goes grocery shopping he is "worn out" by the time he gets back home. Patient has a brother that lives nearby but works and is not Engineer, mining. Patient currently min guard assistance for functional transfers and ambulation for safety. Patient has to perform grooming/hygiene tasks seated at sink due to limited standing tolerance secondary to chronic back pain. Will continue acute OT services for ADLs, functional transfers, energy conservation strategies, activity tolerance.     Follow Up Recommendations  SNF;Supervision/Assistance - 24 hour    Equipment Recommendations  Other (comment)(rolling walker if brother cannot provide)       Precautions / Restrictions Precautions Precautions: Fall Restrictions Weight Bearing Restrictions: Yes RLE Weight Bearing: (heel WB) Other Position/Activity Restrictions: DARCO shoe not present for eval      Mobility Bed Mobility Overal bed mobility: Needs Assistance Bed Mobility: Sit to Supine     Supine to sit: Supervision;HOB elevated Sit to supine: Supervision   General bed mobility comments: pt seated EOB upon arrival, supervision for safety lifting LEs onto bed.   Transfers Overall transfer level: Needs assistance Equipment used: Rolling walker (2 wheeled) Transfers: Sit to/from  Stand Sit to Stand: Min guard         General transfer comment: verbal cues for body mechanics     Balance Overall balance assessment: Needs assistance Sitting-balance support: No upper extremity supported;Feet supported Sitting balance-Leahy Scale: Good Sitting balance - Comments: supervision   Standing balance support: Bilateral upper extremity supported Standing balance-Leahy Scale: Fair Standing balance comment: rolling walker for stability                           ADL either performed or assessed with clinical judgement   ADL Overall ADL's : Needs assistance/impaired Eating/Feeding: Independent;Sitting   Grooming: Set up;Oral care;Wash/dry face;Sitting Grooming Details (indicate cue type and reason): limited standing tolerance due to chronic back pain Upper Body Bathing: Set up;Sitting   Lower Body Bathing: Min guard;Minimal assistance;Sitting/lateral leans;Sit to/from stand   Upper Body Dressing : Set up;Sitting   Lower Body Dressing: Supervision/safety;Sitting/lateral leans;Min guard Lower Body Dressing Details (indicate cue type and reason): patient able to doff/don L sock, unable to attempt with R foot due to bulky dressing, min guard for standing for safety Toilet Transfer: Min guard;Ambulation;Regular Glass blower/designer Details (indicate cue type and reason): simulated to chair sink side, min guard for safety  Toileting- Clothing Manipulation and Hygiene: Min guard;Sitting/lateral lean;Sit to/from stand                            Pertinent Vitals/Pain Pain Assessment: Faces Faces Pain Scale: Hurts little more Pain Location: RLE and back Pain Descriptors / Indicators: Aching;Sore Pain Intervention(s): Limited activity within patient's tolerance;Monitored  during session     Hand Dominance Right   Extremity/Trunk Assessment Upper Extremity Assessment Upper Extremity Assessment: Overall WFL for tasks assessed   Lower Extremity  Assessment Lower Extremity Assessment: Defer to PT evaluation RLE Deficits / Details: knee extension limited by 10 degrees actively due to tightness and pain from incision. Grossly 4-/5 RLE RLE Sensation: decreased light touch LLE Sensation: decreased light touch   Cervical / Trunk Assessment Cervical / Trunk Assessment: Normal   Communication Communication Communication: No difficulties   Cognition Arousal/Alertness: Awake/alert Behavior During Therapy: WFL for tasks assessed/performed Overall Cognitive Status: Within Functional Limits for tasks assessed                                                Home Living Family/patient expects to be discharged to:: Private residence Living Arrangements: Alone Available Help at Discharge: Family;Available PRN/intermittently Type of Home: House Home Access: Stairs to enter [employer] of Steps: 6 Entrance Stairs-Rails: Right;Left Home Layout: One level     Bathroom Shower/Tub: Teacher, early years/pre: Standard     Home Equipment: Other (comment)(reports brother may have a walker he can use)          Prior Functioning/Environment Level of Independence: Independent        Comments: limited activity tolerance 2/2 chronic back pain        OT Problem List: Decreased activity tolerance;Impaired balance (sitting and/or standing);Decreased safety awareness;Decreased knowledge of use of DME or AE;Decreased knowledge of precautions;Pain      OT Treatment/Interventions: Self-care/ADL training;Therapeutic exercise;Energy conservation;DME and/or AE instruction;Therapeutic activities;Patient/family education;Balance training    OT Goals(Current goals can be found in the care plan section) Acute Rehab OT Goals Patient Stated Goal: to feel better OT Goal Formulation: With patient Time For Goal Achievement: 06/17/19 Potential to Achieve Goals: Good  OT Frequency: Min 2X/week    AM-PAC OT "6  Clicks" Daily Activity     Outcome Measure Help from another person eating meals?: None Help from another person taking care of personal grooming?: A Little Help from another person toileting, which includes using toliet, bedpan, or urinal?: A Little Help from another person bathing (including washing, rinsing, drying)?: A Little Help from another person to put on and taking off regular upper body clothing?: A Little Help from another person to put on and taking off regular lower body clothing?: A Little 6 Click Score: 19   End of Session Equipment Utilized During Treatment: Rolling walker Nurse Communication: Mobility status  Activity Tolerance: Patient tolerated treatment well Patient left: in bed;with call bell/phone within reach;with bed alarm set  OT Visit Diagnosis: Unsteadiness on feet (R26.81);Other abnormalities of gait and mobility (R26.89);Pain Pain - Right/Left: Right Pain - part of body: Leg                Time: [id number] OT Time Calculation (min): 30 min Charges:  OT General Charges $OT Visit: 1 Visit OT Evaluation $OT Eval Moderate Complexity: 1 Mod OT Treatments $Self Care/Home Management : 8-22 mins  Shon Millet OT OT office: [place] 06/03/2019, 1:56 PM

## 2019-06-03 NOTE — Progress Notes (Addendum)
[facility] [place]  Subjective  - No new complaints   Objective (!) 112/52 66 98.1 F (36.7 C) (Oral) 17 99%  Intake/Output Summary (Last 24 hours) at 06/03/2019 0737 Last data filed at 06/03/2019 0516 Gross per 24 hour  Intake 2730 ml  Output 1625 ml  Net 1105 ml    Doppler signals AT/PT intact right LE Dressing intact with minimal bloody drainage Lungs non labored breathing  Assessment/Planning: POD # 1  1.  Right external iliac common femoral and profundofemoral endarterectomy with Dacron patch angioplasty 2.  Right common femoral to below-knee popliteal artery bypass with 6 mm ringed PTFE 3.  Amputation right first and second toes D/C IV antibiotics today Darco shoe for heel weightbearing right LE Patented arterial flow with intact right LE doppler signals. Will changed dressing tomorrow.   Roxy Horseman 06/03/2019 7:37 AM --  Laboratory Lab Results: Recent Labs    06/02/19 0056 06/02/19 1533 06/02/19 2332  WBC 10.2  --  13.8*  HGB 8.6* 10.2* 8.8*  HCT 25.8* 30.0* 26.9*  PLT 254  --  212   BMET Recent Labs    06/01/19 0154 06/02/19 1358 06/02/19 1533 06/02/19 2332  NA 132* 133* 134* 135  K 4.4 4.6 5.3* 4.9  CL 102 104  --  107  CO2 21*  --   --  19*  GLUCOSE 121* 137*  --  141*  BUN 14 11  --  15  CREATININE 0.90 0.80  --  1.28*  CALCIUM 9.0  --   --  8.5*    COAG Lab Results  Component Value Date   INR 1.01 10/23/2016   INR 0.94 03/21/2015   INR 0.97 03/09/2015   No results found for: PTT  I have independently interviewed and examined patient and agree with PA assessment and plan above.  He is on aspirin, Plavix, Crestor and these are home meds for him.  Okay to DC antibiotics.  Will evaluate toe amputation sites tomorrow.  Laurette Villescas C. Donzetta Matters, MD [facility] Office: [phone] Pager: [phone]

## 2019-06-04 LAB — BASIC METABOLIC PANEL
Anion gap: 9 (ref 5–15)
BUN: 15 mg/dL (ref 8–23)
CO2: 20 mmol/L — ABNORMAL LOW (ref 22–32)
Calcium: 8.6 mg/dL — ABNORMAL LOW (ref 8.9–10.3)
Chloride: 107 mmol/L (ref 98–111)
Creatinine, Ser: 1.02 mg/dL (ref 0.61–1.24)
GFR calc Af Amer: >60 mL/min (ref >60)
GFR calc non Af Amer: >60 mL/min (ref >60)
Glucose, Bld: 139 mg/dL — ABNORMAL HIGH (ref 70–99)
Potassium: 5.1 mmol/L (ref 3.5–5.1)
Sodium: 136 mmol/L (ref 135–145)

## 2019-06-04 LAB — CBC WITH DIFFERENTIAL/PLATELET
Abs Immature Granulocytes: 0.11 10*3/uL — ABNORMAL HIGH (ref 0.00–0.07)
Basophils Absolute: 0 10*3/uL (ref 0.0–0.1)
Basophils Relative: 0 %
Eosinophils Absolute: 0.2 10*3/uL (ref 0.0–0.5)
Eosinophils Relative: 1 %
HCT: 26.9 % — ABNORMAL LOW (ref 39.0–52.0)
Hemoglobin: 8.9 g/dL — ABNORMAL LOW (ref 13.0–17.0)
Immature Granulocytes: 1 %
Lymphocytes Relative: 12 %
Lymphs Abs: 1.6 10*3/uL (ref 0.7–4.0)
MCH: 32.4 pg (ref 26.0–34.0)
MCHC: 33.1 g/dL (ref 30.0–36.0)
MCV: 97.8 fL (ref 80.0–100.0)
Monocytes Absolute: 1 10*3/uL (ref 0.1–1.0)
Monocytes Relative: 7 %
Neutro Abs: 10.2 10*3/uL — ABNORMAL HIGH (ref 1.7–7.7)
Neutrophils Relative %: 79 %
Platelets: 214 10*3/uL (ref 150–400)
RBC: 2.75 MIL/uL — ABNORMAL LOW (ref 4.22–5.81)
RDW: 13.5 % (ref 11.5–15.5)
WBC: 13.1 10*3/uL — ABNORMAL HIGH (ref 4.0–10.5)
nRBC: 0 % (ref 0.0–0.2)

## 2019-06-04 LAB — GLUCOSE, CAPILLARY
Glucose-Capillary: 110 mg/dL — ABNORMAL HIGH (ref 70–99)
Glucose-Capillary: 136 mg/dL — ABNORMAL HIGH (ref 70–99)
Glucose-Capillary: 56 mg/dL — ABNORMAL LOW (ref 70–99)
Glucose-Capillary: 71 mg/dL (ref 70–99)
Glucose-Capillary: 150 mg/dL — ABNORMAL HIGH (ref 70–99)

## 2019-06-04 NOTE — Progress Notes (Signed)
  Progress Note    06/04/2019 9:43 AM 2 Days Post-Op  Subjective: Having right lower extremity and right foot pain  Vitals:   06/03/19 1933 06/04/19 0350  BP: 129/71 116/72  Pulse: 76 75  Resp: (!) 23 19  Temp: 97.6 F (36.4 C) 98.1 F (36.7 C)  SpO2: 96% 97%    Physical Exam: Awake alert oriented Swelling right lower extremity Right first and second toe amputation sites healing well Strong AT PT signals at the ankle  CBC    Component Value Date/Time   WBC 13.1 (H) 06/04/2019 0254   RBC 2.75 (L) 06/04/2019 0254   HGB 8.9 (L) 06/04/2019 0254   HCT 26.9 (L) 06/04/2019 0254   PLT 214 06/04/2019 0254   MCV 97.8 06/04/2019 0254   MCV 96.7 12/23/2016 1549   MCH 32.4 06/04/2019 0254   MCHC 33.1 06/04/2019 0254   RDW 13.5 06/04/2019 0254   LYMPHSABS 1.6 06/04/2019 0254   MONOABS 1.0 06/04/2019 0254   EOSABS 0.2 06/04/2019 0254   BASOSABS 0.0 06/04/2019 0254    BMET    Component Value Date/Time   NA 136 06/04/2019 0254   NA 131 (L) 09/30/2017 1629   K 5.1 06/04/2019 0254   CL 107 06/04/2019 0254   CO2 20 (L) 06/04/2019 0254   GLUCOSE 139 (H) 06/04/2019 0254   BUN 15 06/04/2019 0254   BUN 13 09/30/2017 1629   CREATININE 1.02 06/04/2019 0254   CREATININE 1.14 07/12/2015 1220   CALCIUM 8.6 (L) 06/04/2019 0254   GFRNONAA >60 06/04/2019 0254   GFRNONAA 69 07/12/2015 1220   GFRAA >60 06/04/2019 0254   GFRAA 79 07/12/2015 1220    INR    Component Value Date/Time   INR 1.01 10/23/2016 0528     Intake/Output Summary (Last 24 hours) at 06/04/2019 0943 Last data filed at 06/04/2019 0900 Gross per 24 hour  Intake 3692.58 ml  Output 500 ml  Net 3192.58 ml     Assessment/plan:  68 y.o. male is s/p right extensive common femoral endarterectomy with Dacron patch and femoral to below-knee popliteal artery bypass with graft and amputation of first and second toes.  Okay to be out of bed with heel weightbearing only we will get Darco shoe.  On aspirin and  statin.    Charmel Pronovost C. Donzetta Matters, MD [facility] [place] Office: [phone] Pager: [phone]  06/04/2019 9:43 AM

## 2019-06-04 NOTE — Progress Notes (Signed)
Orthopedic Tech Progress Note Patient Details:  Jason [name] [birth year] [id number]  Ortho Devices Type of Ortho Device: Darco shoe Ortho Device/Splint Location: right Ortho Device/Splint Interventions: Application   Post Interventions Patient Tolerated: Well Instructions Provided: Care of device   [place] Pink 06/04/2019, 1:21 PM

## 2019-06-04 NOTE — Plan of Care (Signed)
  Problem: Health Behavior/Discharge Planning: Goal: Ability to manage health-related needs will improve Outcome: Progressing   Problem: Clinical Measurements: Goal: Ability to maintain clinical measurements within normal limits will improve Outcome: Progressing Goal: Will remain free from infection Outcome: Progressing Goal: Diagnostic test results will improve Outcome: Progressing Goal: Respiratory complications will improve Outcome: Progressing Goal: Cardiovascular complication will be avoided Outcome: Progressing   Problem: Activity: Goal: Risk for activity intolerance will decrease Outcome: Progressing   Problem: Nutrition: Goal: Adequate nutrition will be maintained Outcome: Progressing   Problem: Elimination: Goal: Will not experience complications related to bowel motility Outcome: Progressing Goal: Will not experience complications related to urinary retention Outcome: Progressing   Problem: Pain Managment: Goal: General experience of comfort will improve Outcome: Progressing   Problem: Skin Integrity: Goal: Risk for impaired skin integrity will decrease Outcome: Progressing   Problem: Education: Goal: Knowledge of General Education information will improve Description: Including pain rating scale, medication(s)/side effects and non-pharmacologic comfort measures Outcome: Progressing   Problem: Health Behavior/Discharge Planning: Goal: Ability to manage health-related needs will improve Outcome: Progressing   Problem: Clinical Measurements: Goal: Ability to maintain clinical measurements within normal limits will improve Outcome: Progressing Goal: Will remain free from infection Outcome: Progressing Goal: Diagnostic test results will improve Outcome: Progressing Goal: Respiratory complications will improve Outcome: Progressing Goal: Cardiovascular complication will be avoided Outcome: Progressing   Problem: Activity: Goal: Risk for activity  intolerance will decrease Outcome: Progressing   Problem: Coping: Goal: Level of anxiety will decrease Outcome: Progressing   Problem: Elimination: Goal: Will not experience complications related to bowel motility Outcome: Progressing Goal: Will not experience complications related to urinary retention Outcome: Progressing   Problem: Pain Managment: Goal: General experience of comfort will improve Outcome: Progressing   Problem: Safety: Goal: Ability to remain free from injury will improve Outcome: Progressing   Problem: Skin Integrity: Goal: Risk for impaired skin integrity will decrease Outcome: Progressing

## 2019-06-04 NOTE — Progress Notes (Signed)
Mr. Jason [name] had a low CBG at 1700 of 56.  Treated with 240 cc orange juice and 2 packs of graham crackers.  Repeat CBG of 71.  Patient was asystematic.  Patient is now eating a meal.

## 2019-06-04 NOTE — TOC Initial Note (Signed)
Transition of Care [facility]) - Initial/Assessment Note    Patient Details  Name: Jason [name] MRN: [id number] Date of Birth: Apr 23, 1951  [facility]) CM/SW Contact:    Bary Castilla, LCSW Phone Number: [phone] 06/04/2019, 1:47 PM  Clinical Narrative:                 CSW met with patient to discuss PT recommendation of a SNF. Patient was aware of recommendation and in agreement with going to a ST SNF. CSW discussed the SNF process.CSW provided patient with medicare.gov rating list.  Patient gave CSW permission to fax referrals out to local facilities near his home. Patient stated that he would like to be close to [facility]..CSW answered questions about the SNF process and the next steps in the process.  TOC continue to follow for discharge planning needs.   Expected Discharge Plan: Skilled Nursing Facility Barriers to Discharge: Ship broker, Continued Medical Work up, SNF Pending bed offer   Patient Goals and CMS Choice Patient states their goals for this hospitalization and ongoing recovery are:: To be able to walk better and get around [facility] Medicare.gov Compare Post Acute Care list provided to:: Patient    Expected Discharge Plan and Services Expected Discharge Plan: [place]       Living arrangements for the past 2 months: Single Family Home                                      Prior Living Arrangements/Services Living arrangements for the past 2 months: Single Family Home Lives with:: Self Patient language and need for interpreter reviewed:: Yes                 Activities of Daily Living Home Assistive Devices/Equipment: CBG Meter, Eyeglasses ADL Screening (condition at time of admission) Patient's cognitive ability adequate to safely complete daily activities?: Yes Is the patient deaf or have difficulty hearing?: No Does the patient have difficulty seeing, even when wearing glasses/contacts?:  Yes(readers) Does the patient have difficulty concentrating, remembering, or making decisions?: Yes Patient able to express need for assistance with ADLs?: Yes Does the patient have difficulty dressing or bathing?: No Independently performs ADLs?: Yes (appropriate for developmental age) Does the patient have difficulty walking or climbing stairs?: Yes Weakness of Legs: Both Weakness of Arms/Hands: None  Permission Sought/Granted   Permission granted to share information with : Yes, Verbal Permission Granted  Share Information with NAME: Nadara Mustard  Permission granted to share info w AGENCY: SNFs  Permission granted to share info w Relationship: Brother  Permission granted to share info w Contact Information: [phone]  Emotional Assessment Appearance:: Appears stated age Attitude/Demeanor/Rapport: Engaged Affect (typically observed): Accepting, Adaptable Orientation: : Oriented to Self, Oriented to Place, Oriented to  Time, Oriented to Situation      Admission diagnosis:  Diabetic foot infection ([place]) [I94.854, L08.9] Patient Active Problem List   Diagnosis Date Noted  . Cellulitis 05/27/2019  . Gangrene of toe ([place]) 05/27/2019  . Status post lumbar spinal fusion 10/27/2017  . Acute on chronic combined systolic and diastolic CHF (congestive heart failure) ([place]) 10/25/2016  . NSTEMI (non-ST elevated myocardial infarction) ([place]) 10/22/2016  . Hypothyroidism 08/17/2015  . RBBB 05/11/2015  . Hyponatremia 04/04/2015  . Hypotension 04/04/2015  . Hemispheric carotid artery syndrome   . Carotid artery narrowing 03/28/2015  . Carotid stenosis- moderate 2011,  95% 2016 s/p stent 02/21/2014  . Unstable angina ([place]) 01/16/2014  . Tobacco abuse 01/16/2014  . Substance abuse in remission ([place]) 10/01/2012  . Radiculitis 02/10/2012  . CAD, CABG Feb 2011, cath x 4 since-medical Rx 10/03/2011  . PVD, hx Rt femoral endarterectomy 2004 10/03/2011  . DM (diabetes mellitus) ([place]) 10/02/2011  .  HTN (hypertension) 10/02/2011  . Hyperlipidemia 10/02/2011   PCP:  Patient, [name] Pharmacy:   [id number] [place], [id number]: [phone]: [address] [address]Phone: [phone]: [phone]    Social Determinants of Health (SDOH) Interventions    Readmission Risk Interventions No flowsheet data found.

## 2019-06-04 NOTE — Progress Notes (Signed)
[facility]                                                                              Patient Demographics  Jason [name], is a 68 y.o. male, DOB - [birth year], EBR:[id number]  Admit date - 05/27/2019   Admitting Physician Georgette Shell, MD  Outpatient Primary MD for the patient is Patient, [name]  Outpatient specialists:   LOS - 8  days   Medical records reviewed and are as summarized below:  Chief Complaint  Patient presents with  . Foot Pain       Brief summary   JOESPH [name] is a 68 y.o. male  presented from [place] center with medical history of DM, not on medications for 1 month, b/l Carotid artery disease with R ICA stent, occluded L ICA stent,CVA, COPD, CAD s/p CABG in 2011, and and NSTEMI and DES to sVF- OM2 in 2018, HTN, GERD, nicotine abuse presented with about 4 days of right foot pain followed by a change in color of his 1st toe to black. He admits to fever, chills, nausea and vomiting. He reported subjective fevers/ chill about 1 wk ago. The foot then became red and painful and the toe then became purple and black. His pain is moderate and worse when walking, radiates up his leg. He has had nausea and occasional vomiting for 2 days. He has not been able to pick up his medications in at least 1 month as he lives in [place] and his pharmacy is in [place].  In ED sodium 125, CO2 18, WBCs 13.6.  Right foot x-ray showed no osteomyelitis or gas.  He was admitted under hospital service due to sepsis secondary to right foot cellulitis/gangrene of the first toe with history of PVD.  Blood culture negative to date.  He was started on Zosyn and vancomycin.  He was seen by vascular surgery and they recommended aortogram for which patient was transferred to [facility] on 05/29/2019.  Underwent ultrasound-guided cannulation of the left and right common femoral artery, aortogram with bilateral lower extremity runoff on 05/31/2019.  Findings below  per vascular surgeon note "The aorta and iliac segments are heavily diseased but there does appear to be nonflow-limiting stenosis to the both common femoral arteries.  On the right side which is the site of issue he possibly has a bypass to the profunda the SFA is flush occluded.  He reconstitutes above-knee popliteal.  Appears to have runoff via the anterior tibial and peroneal arteries.  On the left side he has a very diseased SFA.  Appears to occlude his tibioperoneal trunk and has runoff to the ankle via peroneal artery".  Patient underwent following procedures on 06/02/2019. 1.  Right external iliac common femoral and profundofemoral endarterectomy with Dacron patch angioplasty 2.  Right common femoral to below-knee popliteal artery bypass with 6 mm ringed PTFE 3.  Amputation right first and second toes  Assessment & Plan    Principal Problem: Sepsis with right foot cellulitis, gangrene of the right first toe with history of PVD, right femoral endarterectomy 2004 -Met sepsis criteria. Blood cultures no  growth so far. Given underlying history of PVD, vascular surgery consulted. Patient was seen by Dr. Scot Dock,, recommended to transfer to [facility] for arteriogram and may need amputation of the right great toe after that.  He underwent aortogram which showed severe multivessel peripheral arterial disease. Patient underwent following procedures on 06/02/2019. 1.  Right external iliac common femoral and profundofemoral endarterectomy with Dacron patch angioplasty 2.  Right common femoral to below-knee popliteal artery bypass with 6 mm ringed PTFE 3.  Amputation right first and second toes Antibiotics discontinued on 06/03/2019.  Seen by PT OT.  Recommended SNF.    Active Problems: Diabetes mellitus type 2, uncontrolled with hyperglycemia, noncompliant, with right foot cellulitis, gangrene -Patient reports that he ran out of his Glucophage over a month ago, used to be on insulin in the  past -Hemoglobin A1c 8.5, Blood sugar controlled. Continue 10 units of Lantus and SSI.  Hyponatremia, has also underlying chronic hyponatremia.  Completely normal now.  History of coronary disease s/p CABG, stent -Continue Plavix, aspirin, metoprolol, statin  Prolonged QTC, trigeminy -QTC 532, EKG changes, extensive cardiac history, continue telemetry, repeat EKG on 06/01/2019 shows QTc of 506.  Hypothyroidism -TSH 5.18, free T4 0.8, ran out of Synthroid, resumed Synthroid 50 mcg.  Depression Ran out of Zoloft, resumed.  Monitor sodium closely.  Essential hypertension Blood pressure fairly stable.,  Continue to hold Imdur, and continue decreased dose of Toprol-XL to 25 mg daily  Nicotine use Patient counseled, continue nicotine patch  Right lower extremity swelling: Doppler negative.  DVT ruled out.  Code Status: Partial (yes to everything but no intubation) DVT Prophylaxis:  Lovenox  Family Communication: No family present.  Discussed plan of care with patient. Disposition Plan: To be determined based on further vascular work-up and will be discharged once cleared by vascular surgery.  PT recommends SNF.  Procedures:  None  Consultants:   Vascular surgery, Dr. Scot Dock  Antimicrobials:   Anti-infectives (From admission, onward)   Start     Dose/Rate Route Frequency Ordered Stop   06/03/19 0000  vancomycin (VANCOCIN) 1,250 mg in sodium chloride 0.9 % 250 mL IVPB  Status:  Discontinued     1,250 mg 166.7 mL/hr over 90 Minutes Intravenous Every 24 hours 06/02/19 1659 06/03/19 1317   05/29/19 2200  vancomycin (VANCOCIN) 1,500 mg in sodium chloride 0.9 % 500 mL IVPB  Status:  Discontinued     1,500 mg 250 mL/hr over 120 Minutes Intravenous Every 24 hours 05/29/19 1419 06/02/19 1659   05/29/19 2200  metroNIDAZOLE (FLAGYL) IVPB 500 mg  Status:  Discontinued     500 mg 100 mL/hr over 60 Minutes Intravenous Every 8 hours 05/29/19 1427 06/03/19 1344   05/29/19 2200  ceFEPIme  (MAXIPIME) 2 g in sodium chloride 0.9 % 100 mL IVPB  Status:  Discontinued     2 g 200 mL/hr over 30 Minutes Intravenous Every 8 hours 05/29/19 1428 06/03/19 1317   05/28/19 2200  vancomycin (VANCOCIN) 1,750 mg in sodium chloride 0.9 % 500 mL IVPB  Status:  Discontinued     1,750 mg 250 mL/hr over 120 Minutes Intravenous Every 24 hours 05/28/19 0610 05/29/19 1419   05/28/19 1000  vancomycin (VANCOCIN) 1,750 mg in sodium chloride 0.9 % 500 mL IVPB  Status:  Discontinued     1,750 mg 250 mL/hr over 120 Minutes Intravenous Every 24 hours 05/27/19 1949 05/28/19 0610   05/27/19 2000  piperacillin-tazobactam (ZOSYN) IVPB 3.375 g  Status:  Discontinued  3.375 g 12.5 mL/hr over 240 Minutes Intravenous Every 8 hours 05/27/19 1500 05/27/19 1941   05/27/19 2000  piperacillin-tazobactam (ZOSYN) IVPB 3.375 g  Status:  Discontinued     3.375 g 12.5 mL/hr over 240 Minutes Intravenous Every 8 hours 05/27/19 1941 05/29/19 1427   05/27/19 1800  piperacillin-tazobactam (ZOSYN) IVPB 3.375 g  Status:  Discontinued     3.375 g 100 mL/hr over 30 Minutes Intravenous  Once 05/27/19 1748 05/27/19 1941   05/27/19 1800  vancomycin (VANCOCIN) 1,500 mg in sodium chloride 0.9 % 500 mL IVPB     1,500 mg 250 mL/hr over 120 Minutes Intravenous  Once 05/27/19 1748 05/28/19 0543   05/27/19 1715  piperacillin-tazobactam (ZOSYN) IVPB 3.375 g  Status:  Discontinued     3.375 g 100 mL/hr over 30 Minutes Intravenous Every 6 hours 05/27/19 1212 05/27/19 1500   05/27/19 1215  piperacillin-tazobactam (ZOSYN) IVPB 3.375 g  Status:  Discontinued     3.375 g 100 mL/hr over 30 Minutes Intravenous Every 6 hours 05/27/19 1208 05/27/19 1212   05/27/19 1100  piperacillin-tazobactam (ZOSYN) IVPB 3.375 g     3.375 g 100 mL/hr over 30 Minutes Intravenous  Once 05/27/19 1055 05/27/19 1155         Medications  Scheduled Meds: . aspirin EC  81 mg Oral Daily  . clopidogrel  75 mg Oral Daily  . docusate sodium  100 mg Oral Daily   . enoxaparin (LOVENOX) injection  40 mg Subcutaneous Q24H  . insulin aspart  0-15 Units Subcutaneous TID WC  . insulin aspart  0-5 Units Subcutaneous QHS  . insulin glargine  15 Units Subcutaneous QHS  . levothyroxine  50 mcg Oral Daily  . metoprolol succinate  25 mg Oral Daily  . nicotine  21 mg Transdermal Q24H  . pantoprazole  40 mg Oral Daily  . rosuvastatin  10 mg Oral Daily  . sertraline  250 mg Oral QHS   Continuous Infusions: . sodium chloride 10 mL/hr at 06/01/19 2143  . sodium chloride    . sodium chloride 125 mL/hr at 06/03/19 2208  . magnesium sulfate bolus IVPB     PRN Meds:.sodium chloride, sodium chloride, acetaminophen **OR** acetaminophen, albuterol, alum & mag hydroxide-simeth, bisacodyl, famotidine, guaiFENesin-dextromethorphan, hydrALAZINE, HYDROcodone-acetaminophen, HYDROmorphone (DILAUDID) injection, magnesium sulfate bolus IVPB, ondansetron, oxyCODONE-acetaminophen, phenol, potassium chloride, senna-docusate, sodium phosphate      Subjective:   Patient seen and examined.  Continues to complain of right foot pain.  No other complaint. Objective:   Vitals:   06/03/19 0513 06/03/19 1358 06/03/19 1933 06/04/19 0350  BP: (!) 112/52 107/65 129/71 116/72  Pulse: 66 81 76 75  Resp: 17 18 (!) 23 19  Temp: 98.1 F (36.7 C) (!) 97.5 F (36.4 C) 97.6 F (36.4 C) 98.1 F (36.7 C)  TempSrc: Oral Oral Oral Oral  SpO2: 99% 96% 96% 97%  Weight:      Height:        Intake/Output Summary (Last 24 hours) at 06/04/2019 1037 Last data filed at 06/04/2019 0900 Gross per 24 hour  Intake 3692.58 ml  Output 500 ml  Net 3192.58 ml     Wt Readings from Last 3 Encounters:  06/02/19 75.6 kg  03/02/19 77.2 kg  05/06/18 74.1 kg   Physical Exam General exam: Appears calm and comfortable  Respiratory system: Clear to auscultation. Respiratory effort normal. Cardiovascular system: S1 & S2 heard, RRR. No JVD, murmurs, rubs, gallops or clicks.  +1 pitting edema right  lower  extremity Gastrointestinal system: Abdomen is nondistended, soft and nontender. No organomegaly or masses felt. Normal bowel sounds heard. Central nervous system: Alert and oriented. No focal neurological deficits. Extremities: Symmetric 5 x 5 power.  Edematous right lower extremity with calf tenderness. Skin: Dressing on right foot.  Direct visualization deferred. Psychiatry: Judgement and insight appear normal. Mood & affect appropriate.          Data Reviewed:  I have personally reviewed following labs and imaging studies  Micro Results Recent Results (from the past 240 hour(s))  Culture, blood (routine x 2)     Status: None   Collection Time: 05/27/19 10:45 AM   Specimen: BLOOD  Result Value Ref Range Status   Specimen Description BLOOD LEFT ANTECUBITAL  Final   Special Requests   Final    BOTTLES DRAWN AEROBIC ONLY Blood Culture results may not be optimal due to an inadequate volume of blood received in culture bottles   Culture   Final    NO GROWTH 5 DAYS Performed at [facility], [place] [address] [place], [place] [address]    Report Status 06/01/2019 FINAL  Final  Culture, blood (routine x 2)     Status: None   Collection Time: 05/27/19 11:00 AM   Specimen: BLOOD RIGHT WRIST  Result Value Ref Range Status   Specimen Description   Final    BLOOD RIGHT WRIST Performed at [facility], [address] [place], [place] [address]    Special Requests   Final    BOTTLES DRAWN AEROBIC AND ANAEROBIC Blood Culture adequate volume Performed at [facility], [address], [place], [place] [address]    Culture   Final    NO GROWTH 5 DAYS Performed at [facility], [place] [address] [place], [place] [address]    Report Status 06/01/2019 FINAL  Final  SARS CORONAVIRUS 2 (TAT 6-24 HRS) Nasopharyngeal Nasopharyngeal Swab     Status: None   Collection Time: 05/27/19 11:08 AM   Specimen: Nasopharyngeal Swab  Result Value Ref Range Status    SARS Coronavirus 2 NEGATIVE NEGATIVE Final    Comment: (NOTE) SARS-CoV-2 target nucleic acids are NOT DETECTED. The SARS-CoV-2 RNA is generally detectable in upper and lower respiratory specimens during the acute phase of infection. Negative results do not preclude SARS-CoV-2 infection, do not rule out co-infections with other pathogens, and should not be used as the sole basis for treatment or other patient management decisions. Negative results must be combined with clinical observations, patient history, and epidemiological information. The expected result is Negative. Fact Sheet for Patients: SugarRoll.be Fact Sheet for Healthcare Providers: https://www.woods-mathews.com/ This test is not yet approved or cleared by the [place] FDA and  has been authorized for detection and/or diagnosis of SARS-CoV-2 by FDA under an Emergency Use Authorization (EUA). This EUA will remain  in effect (meaning this test can be used) for the duration of the COVID-19 declaration under Section 56 4(b)(1) of the Act, 21 U.S.C. section 360bbb-3(b)(1), unless the authorization is terminated or revoked sooner. Performed at [facility], [place] [address], [place], [place] [address]   Surgical pcr screen     Status: Abnormal   Collection Time: 06/02/19  4:38 AM   Specimen: Nasal Mucosa; Nasal Swab  Result Value Ref Range Status   MRSA, PCR NEGATIVE NEGATIVE Final   Staphylococcus aureus POSITIVE (A) NEGATIVE Final    Comment: (NOTE) The Xpert SA Assay (FDA approved  for NASAL specimens in patients 75 years of age and older), is one component of a comprehensive surveillance program. It is not intended to diagnose infection nor to guide or monitor treatment. Performed at [facility], [place] [address] [place],  [address]     Radiology Reports DG Chest 1 View  Result Date: 05/27/2019 CLINICAL DATA:  Cellulitis. EXAM: CHEST  1 VIEW  COMPARISON:  August 01, 2017. FINDINGS: The heart size and mediastinal contours are within normal limits. Both lungs are clear. Status post coronary bypass graft. The visualized skeletal structures are unremarkable. IMPRESSION: No active disease. Electronically Signed   By: Marijo Conception M.D.   On: 05/27/2019 11:34   PERIPHERAL VASCULAR CATHETERIZATION  Result Date: 05/31/2019 Patient name: JARAY BOLIVER MRN: [id number] DOB: [birth year] Sex: male 05/31/2019 Pre-operative Diagnosis: Critical right lower extremity ischemia with great toe ulceration Post-operative diagnosis:  Same Surgeon:  Erlene Quan C. Donzetta Matters, MD Procedure Performed: 1.  Ultrasound-guided cannulation left common femoral artery 2.  Ultrasound-guided cannulation right common femoral artery 3.  Aortogram with bilateral lower extremity runoff 4.  Moderate sedation with fentanyl and Versed for Indications: 68 year old male here with great toe ulceration on the right.  He has severely depressed ABIs bilaterally.  Has had previous intervention on the right common femoral artery after cardiac catheterization.  Has possibly had greater saphenous vein harvested on the right as well. Findings: I cannot access from the left side because a dissection plane.  From the right side I was able to access.  The aorta and iliac segments are heavily diseased but there does appear to be nonflow-limiting stenosis to the both common femoral arteries.  On the right side which is the site of issue he possibly has a bypass to the profunda the SFA is flush occluded.  He reconstitutes above-knee popliteal.  Appears to have runoff via the anterior tibial and peroneal arteries.  On the left side he has a very diseased SFA.  Appears to occlude his tibioperoneal trunk and has runoff to the ankle via peroneal artery. We will consider patient for right common femoral to popliteal artery bypass.  He will be vein mapped for bilateral greater saphenous veins.  Procedure:  The patient  was identified in the holding area and taken to room 8.  The patient was then placed supine on the table and prepped and draped in the usual sterile fashion.  A time out was called.  Ultrasound was used to evaluate the left common femoral artery.  This was heavily diseased.  The cannulated 1 healthy area with micropuncture needle followed by wire and sheath.  Images saved the permanent record.  The wire would not pass I did use fluoroscopy to get it to pass further.  I placed a micropuncture sheath.  I then used a stiff angled Glidewire.  Unfortunately was in a subintimal plane.  I placed a 5 [place] sheath 10 disease.  Catheter to reenter but could not.  I then used ultrasound to identify the common femoral artery on the right.  This also appeared to be heavily diseased there was scar tissue was possibly a bypass in place.  I cannulated this with direct ultrasound visualization images saved the permanent record.  I did get a Bentson wire to pass.  I then performed retrograde angiography with hand-injection to confirm I was intraluminal in the aorta.  I then placed a Omni catheter to the level of L1 performed aortogram followed bilateral lower extremity runoff with the above  findings.  Patient will be considered for right common femoral to popliteal artery bypass.  We will vein map him.  He will likely need toe amputation as well.  Catheter was removed over wire.  Sheath will be pulled in postoperative holding.  He tolerated procedure well without immediate complication. Contrast: 100cc Brandon C. Donzetta Matters, MD [facility] Office: [phone] Pager: [phone]   DG Foot Complete Right  Result Date: 05/27/2019 CLINICAL DATA:  Cellulitis.  Right foot swelling. EXAM: RIGHT FOOT COMPLETE - 3+ VIEW COMPARISON:  None. FINDINGS: There is no evidence of fracture or dislocation. There is no evidence of arthropathy or other focal bone abnormality. No lytic destruction is seen to suggest  osteomyelitis. Soft tissues are unremarkable. IMPRESSION: Negative. Electronically Signed   By: Marijo Conception M.D.   On: 05/27/2019 11:36   VAS [place] ABI WITH/WO TBI  Result Date: 05/30/2019 LOWER EXTREMITY DOPPLER STUDY Indications: Ulceration, gangrene, and peripheral artery disease. High Risk Factors: Hypertension, hyperlipidemia, Diabetes, current smoker,                    coronary artery disease.  Vascular Interventions: Right ICA stent 03/28/15, CABG 2011. Comparison Study: No prior study on file for comparison Performing Technologist: Sharion Dove RVS  Examination Guidelines: A complete evaluation includes at minimum, Doppler waveform signals and systolic blood pressure reading at the level of bilateral brachial, anterior tibial, and posterior tibial arteries, when vessel segments are accessible. Bilateral testing is considered an integral part of a complete examination. Photoelectric Plethysmograph (PPG) waveforms and toe systolic pressure readings are included as required and additional duplex testing as needed. Limited examinations for reoccurring indications may be performed as noted.  ABI Findings: +---------+------------------+-----+-------------------+------------------+ Right    Rt Pressure (mmHg)IndexWaveform           Comment            +---------+------------------+-----+-------------------+------------------+ Brachial 135                    triphasic                             +---------+------------------+-----+-------------------+------------------+ PTA      54                0.40 dampened monophasic                   +---------+------------------+-----+-------------------+------------------+ DP       40                0.30 monophasic                            +---------+------------------+-----+-------------------+------------------+ Great Toe                                          bandage/ulceration  +---------+------------------+-----+-------------------+------------------+ +---------+------------------+-----+-------------------+-------+ Left     Lt Pressure (mmHg)IndexWaveform           Comment +---------+------------------+-----+-------------------+-------+ Brachial 129                    triphasic                  +---------+------------------+-----+-------------------+-------+ PTA      38  0.28 dampened monophasic        +---------+------------------+-----+-------------------+-------+ DP       33                0.24 dampened monophasic        +---------+------------------+-----+-------------------+-------+ Great Toe0                 0.00                    absent  +---------+------------------+-----+-------------------+-------+ +-------+-----------+-----------+------------+------------+ ABI/TBIToday's ABIToday's TBIPrevious ABIPrevious TBI +-------+-----------+-----------+------------+------------+ Right  0.4        wound                               +-------+-----------+-----------+------------+------------+ Left   0.2        absent                              +-------+-----------+-----------+------------+------------+  Summary: Right: Resting right ankle-brachial index indicates severe right lower extremity arterial disease. Left: Resting left ankle-brachial index indicates critical left limb ischemia. The left toe-brachial index is abnormal.  *See table(s) above for measurements and observations.  Electronically signed by Servando Snare MD on 05/30/2019 at 5:01:14 PM.    Final    VAS [place] LOWER EXTREMITY SAPHENOUS VEIN MAPPING  Result Date: 06/01/2019 LOWER EXTREMITY VEIN MAPPING Indications:       ulceration Other Indications: Occluded right lower extremity bypass graft.  Comparison Study: No priors. Performing Technologist: Oda Cogan RDMS, RVT  Examination Guidelines: A complete evaluation includes B-mode imaging, spectral Doppler,  color Doppler, and power Doppler as needed of all accessible portions of each vessel. Bilateral testing is considered an integral part of a complete examination. Limited examinations for reoccurring indications may be performed as noted. +--------------+----------------+-------------------+--------------+-----------+  RT Diameter    RT Findings           GSV         LT Diameter  LT Findings      (cm)                                             (cm)                 +--------------+----------------+-------------------+--------------+-----------+      0.51                       Saphenofemoral        0.58                                                    Junction                                +--------------+----------------+-------------------+--------------+-----------+      0.37                       Proximal thigh        0.59                 +--------------+----------------+-------------------+--------------+-----------+  not visualized      Mid thigh          0.37                                and harvested                                               +--------------+----------------+-------------------+--------------+-----------+                not visualized    Distal thigh         0.43                                and harvested                                               +--------------+----------------+-------------------+--------------+-----------+                not visualized        Knee             0.29                 +--------------+----------------+-------------------+--------------+-----------+      0.26        branching         Prox calf          0.24      branching  +--------------+----------------+-------------------+--------------+-----------+      0.21        branching         Mid calf           0.26                 +--------------+----------------+-------------------+--------------+-----------+      0.24                          Distal calf         0.20                 +--------------+----------------+-------------------+--------------+-----------+      0.27                            Ankle            0.23                 +--------------+----------------+-------------------+--------------+-----------+ Diagnosing physician: Curt Jews MD Electronically signed by Curt Jews MD on 06/01/2019 at 8:44:51 PM.    Final    VAS [place] LOWER EXTREMITY VENOUS (DVT)  Result Date: 06/04/2019  Lower Venous Study Indications: Pain, and Status post right femoral -popliteal bypass graft using the GSV.  Comparison Study: No priors. Performing Technologist: Oda Cogan RDMS, RVT  Examination Guidelines: A complete evaluation includes B-mode imaging, spectral Doppler, color Doppler, and power Doppler as needed of all accessible portions of each vessel. Bilateral testing is considered an integral part of a complete examination. Limited examinations for reoccurring indications may be performed as noted.  +---------+---------------+---------+-----------+----------+--------------+ RIGHT    CompressibilityPhasicitySpontaneityPropertiesThrombus Aging +---------+---------------+---------+-----------+----------+--------------+ CFV  Full           Yes      Yes                                 +---------+---------------+---------+-----------+----------+--------------+ SFJ      Full                                                        +---------+---------------+---------+-----------+----------+--------------+ FV Prox  Full                                                        +---------+---------------+---------+-----------+----------+--------------+ FV Mid   Full                                                        +---------+---------------+---------+-----------+----------+--------------+ FV DistalFull                                                         +---------+---------------+---------+-----------+----------+--------------+ PFV      Full                                                        +---------+---------------+---------+-----------+----------+--------------+ POP      Full           Yes      Yes                                 +---------+---------------+---------+-----------+----------+--------------+ PTV      Full                                                        +---------+---------------+---------+-----------+----------+--------------+ PERO     Full                                                        +---------+---------------+---------+-----------+----------+--------------+   +----+---------------+---------+-----------+----------+--------------+ LEFTCompressibilityPhasicitySpontaneityPropertiesThrombus Aging +----+---------------+---------+-----------+----------+--------------+ CFV Full           Yes      Yes                                 +----+---------------+---------+-----------+----------+--------------+ SFJ Full                                                        +----+---------------+---------+-----------+----------+--------------+  Summary: Right: There is no evidence of deep vein thrombosis in the lower extremity. No cystic structure found in the popliteal fossa. Left: No evidence of common femoral vein obstruction.  *See table(s) above for measurements and observations. Electronically signed by Servando Snare MD on 06/04/2019 at 10:20:16 AM.    Final     Lab Data:  CBC: Recent Labs  Lab 06/01/19 0154 06/02/19 0056 06/02/19 1358 06/02/19 1533 06/02/19 2332 06/03/19 1025 06/04/19 0254  WBC 10.6* 10.2  --   --  13.8* 13.5* 13.1*  NEUTROABS  --   --   --   --   --  11.3* 10.2*  HGB 9.4* 8.6* 6.8* 10.2* 8.8* 8.8* 8.9*  HCT 28.5* 25.8* 20.0* 30.0* 26.9* 26.1* 26.9*  MCV 96.3 96.3  --   --  98.2 97.0 97.8  PLT 268 254  --   --  212 192 697   Basic Metabolic  Panel: Recent Labs  Lab 05/31/19 0616 06/01/19 0154 06/02/19 1358 06/02/19 1533 06/02/19 2332 06/03/19 1025 06/04/19 0254  NA 134* 132* 133* 134* 135 132* 136  K 4.8 4.4 4.6 5.3* 4.9 4.3 5.1  CL 103 102 104  --  107 105 107  CO2 22 21*  --   --  19* 18* 20*  GLUCOSE 139* 121* 137*  --  141* 177* 139*  BUN _0 --  _1 CREATININE 1.14 0.90 0.80  --  1.28* 1.14 1.02  CALCIUM 9.3 9.0  --   --  8.5* 8.2* 8.6*   GFR: Estimated Creatinine Clearance: 71.2 mL/min (by C-G formula based on SCr of 1.02 mg/dL). Liver Function Tests: No results for input(s): AST, ALT, ALKPHOS, BILITOT, PROT, ALBUMIN in the last 168 hours. No results for input(s): LIPASE, AMYLASE in the last 168 hours. No results for input(s): AMMONIA in the last 168 hours. Coagulation Profile: No results for input(s): INR, PROTIME in the last 168 hours. Cardiac Enzymes: No results for input(s): CKTOTAL, CKMB, CKMBINDEX, TROPONINI in the last 168 hours. BNP (last 3 results) No results for input(s): PROBNP in the last 8760 hours. HbA1C: No results for input(s): HGBA1C in the last 72 hours. CBG: Recent Labs  Lab 06/03/19 0614 06/03/19 1132 06/03/19 1619 06/03/19 2149 06/04/19 0612  GLUCAP 99 168* 182* 130* 110*   Lipid Profile: No results for input(s): CHOL, HDL, LDLCALC, TRIG, CHOLHDL, LDLDIRECT in the last 72 hours. Thyroid Function Tests: No results for input(s): TSH, T4TOTAL, FREET4, T3FREE, THYROIDAB in the last 72 hours. Anemia Panel: No results for input(s): VITAMINB12, FOLATE, FERRITIN, TIBC, IRON, RETICCTPCT in the last 72 hours. Urine analysis:    Component Value Date/Time   COLORURINE YELLOW 07/08/2013 0226   APPEARANCEUR CLEAR 07/08/2013 0226   LABSPEC 1.006 07/08/2013 0226   PHURINE 6.0 07/08/2013 0226   GLUCOSEU NEGATIVE 07/08/2013 0226   HGBUR NEGATIVE 07/08/2013 0226   BILIRUBINUR NEGATIVE 07/08/2013 0226   KETONESUR NEGATIVE 07/08/2013 0226   PROTEINUR NEGATIVE 07/08/2013 0226    UROBILINOGEN 0.2 07/08/2013 0226   NITRITE NEGATIVE 07/08/2013 0226   LEUKOCYTESUR NEGATIVE 07/08/2013 0226    Total time spent 25 minutes Darliss Cheney M.D. [facility] 06/04/2019, 10:37 AM   Call night coverage person covering after 7pm

## 2019-06-04 NOTE — NC FL2 (Signed)
[place] LEVEL OF CARE SCREENING TOOL     IDENTIFICATION  Patient Name: Jason [name] Birthdate: [birth year] Sex: male Admission Date (Current Location): 05/27/2019  [facility] and [place] Number:  Herbalist and Address:  [facility] [place] [address] [place], [place] [address]      Provider Number: [id number]  Attending Physician Name and Address:  Darliss Cheney, MD  Relative Name and Phone Number:  Nadara Mustard [phone]    Current Level of Care: Hospital Recommended Level of Care: [place] Prior Approval Number:    Date Approved/Denied:   PASRR Number: Pending  Discharge Plan: SNF    Current Diagnoses: Patient Active Problem List   Diagnosis Date Noted  . Cellulitis 05/27/2019  . Gangrene of toe ([place]) 05/27/2019  . Status post lumbar spinal fusion 10/27/2017  . Acute on chronic combined systolic and diastolic CHF (congestive heart failure) ([place]) 10/25/2016  . NSTEMI (non-ST elevated myocardial infarction) ([place]) 10/22/2016  . Hypothyroidism 08/17/2015  . RBBB 05/11/2015  . Hyponatremia 04/04/2015  . Hypotension 04/04/2015  . Hemispheric carotid artery syndrome   . Carotid artery narrowing 03/28/2015  . Carotid stenosis- moderate 2011, 95% 2016 s/p stent 02/21/2014  . Unstable angina ([place]) 01/16/2014  . Tobacco abuse 01/16/2014  . Substance abuse in remission ([place]) 10/01/2012  . Radiculitis 02/10/2012  . CAD, CABG Feb 2011, cath x 4 since-medical Rx 10/03/2011  . PVD, hx Rt femoral endarterectomy 2004 10/03/2011  . DM (diabetes mellitus) ([place]) 10/02/2011  . HTN (hypertension) 10/02/2011  . Hyperlipidemia 10/02/2011    Orientation RESPIRATION BLADDER Height & Weight     Self, Time, Situation, Place  Normal Continent Weight: 166 lb 11.2 oz (75.6 kg) Height:  5' 9.02" (175.3 cm)  BEHAVIORAL SYMPTOMS/MOOD NEUROLOGICAL BOWEL NUTRITION STATUS      Continent    AMBULATORY STATUS COMMUNICATION OF  NEEDS Skin   Limited Assist Verbally Surgical wounds(right toes, groin,compression wraps)                       Personal Care Assistance Level of Assistance  Bathing, Feeding, Dressing Bathing Assistance: Limited assistance Feeding assistance: Independent Dressing Assistance: Independent     Functional Limitations Info  Sight, Hearing, Speech Sight Info: Impaired Hearing Info: Impaired Speech Info: Adequate    SPECIAL CARE FACTORS FREQUENCY  PT (By licensed PT), OT (By licensed OT)     PT Frequency: 5x per week OT Frequency: 5x per week            Contractures Contractures Info: Not present    Additional Factors Info  Code Status, Allergies, Insulin Sliding Scale, Psychotropic Code Status Info: Partial Allergies Info: SULFA ANTIBIOTICS,COREG Psychotropic Info: Depression-Zoloft Insulin Sliding Scale Info: insulin glargine (LANTUS) injection 15 Units   daily at bedtime       Current Medications (06/04/2019):  This is the current hospital active medication list Current Facility-Administered Medications  Medication Dose Route Frequency Provider Last Rate Last Admin  . 0.9 %  sodium chloride infusion   Intravenous PRN Ulyses Amor, PA-C 10 mL/hr at 06/01/19 2143 New Bag at 06/01/19 2143  . 0.9 %  sodium chloride infusion  500 mL Intravenous Once PRN Laurence Slate M, PA-C      . 0.9 %  sodium chloride infusion   Intravenous Continuous Ulyses Amor, PA-C 125 mL/hr at 06/03/19 2208 New Bag at 06/03/19 2208  . acetaminophen (TYLENOL) tablet 650 mg  650 mg Oral Q6H PRN Ulyses Amor, PA-C   650 mg at 06/01/19 1438   Or  . acetaminophen (TYLENOL) suppository 650 mg  650 mg Rectal Q6H PRN Ulyses Amor, PA-C      . albuterol (PROVENTIL) (2.5 MG/3ML) 0.083% nebulizer solution 2.5 mg  2.5 mg Nebulization Q4H PRN Laurence Slate M, PA-C      . alum & mag hydroxide-simeth (MAALOX/MYLANTA) 200-200-20 MG/5ML suspension 15-30 mL  15-30 mL Oral Q2H PRN Laurence Slate M, PA-C       . aspirin EC tablet 81 mg  81 mg Oral Daily Laurence Slate M, PA-C   81 mg at 06/04/19 1100  . bisacodyl (DULCOLAX) EC tablet 5 mg  5 mg Oral Daily PRN Laurence Slate M, PA-C      . clopidogrel (PLAVIX) tablet 75 mg  75 mg Oral Daily Laurence Slate M, PA-C   75 mg at 06/04/19 1100  . docusate sodium (COLACE) capsule 100 mg  100 mg Oral Daily Laurence Slate M, PA-C   100 mg at 06/04/19 1100  . enoxaparin (LOVENOX) injection 40 mg  40 mg Subcutaneous Q24H Laurence Slate M, PA-C   40 mg at 06/03/19 2208  . famotidine (PEPCID) tablet 20 mg  20 mg Oral BID PRN Ulyses Amor, PA-C   20 mg at 06/01/19 2253  . guaiFENesin-dextromethorphan (ROBITUSSIN DM) 100-10 MG/5ML syrup 15 mL  15 mL Oral Q4H PRN Laurence Slate M, PA-C      . hydrALAZINE (APRESOLINE) injection 5 mg  5 mg Intravenous Q20 Min PRN Laurence Slate M, PA-C      . HYDROcodone-acetaminophen (NORCO/VICODIN) 5-325 MG per tablet 1-2 tablet  1-2 tablet Oral Q4H PRN Ulyses Amor, PA-C   2 tablet at 06/03/19 1948  . HYDROmorphone (DILAUDID) injection 0.5-1 mg  0.5-1 mg Intravenous Q2H PRN Laurence Slate M, PA-C   1 mg at 06/04/19 1313  . insulin aspart (novoLOG) injection 0-15 Units  0-15 Units Subcutaneous TID WC Ulyses Amor, PA-C   2 Units at 06/04/19 1245  . insulin aspart (novoLOG) injection 0-5 Units  0-5 Units Subcutaneous QHS Ulyses Amor, [place]   2 Units at 05/29/19 2201  . insulin glargine (LANTUS) injection 15 Units  15 Units Subcutaneous QHS Ulyses Amor, [place]   15 Units at 06/03/19 2208  . levothyroxine (SYNTHROID) tablet 50 mcg  50 mcg Oral Daily Laurence Slate M, PA-C   50 mcg at 06/04/19 [id number]  . magnesium sulfate IVPB 2 g 50 mL  2 g Intravenous Daily PRN Laurence Slate M, PA-C      . metoprolol succinate (TOPROL-XL) 24 hr tablet 25 mg  25 mg Oral Daily Laurence Slate M, PA-C   25 mg at 06/04/19 1100  . nicotine (NICODERM CQ - dosed in mg/24 hours) patch 21 mg  21 mg Transdermal Q24H Laurence Slate M, PA-C   21 mg at 06/03/19 1952   . ondansetron (ZOFRAN) injection 4 mg  4 mg Intravenous Q6H PRN Laurence Slate M, PA-C      . oxyCODONE-acetaminophen (PERCOCET/ROXICET) 5-325 MG per tablet 1-2 tablet  1-2 tablet Oral Q4H PRN Ulyses Amor, PA-C   2 tablet at 06/04/19 0007  . pantoprazole (PROTONIX) EC tablet 40 mg  40 mg Oral Daily Laurence Slate M, PA-C   40 mg at 06/04/19 1100  . phenol (CHLORASEPTIC) mouth spray 1 spray  1 spray Mouth/Throat PRN Laurence Slate M, PA-C      . potassium chloride  SA (KLOR-CON) CR tablet 20-40 mEq  20-40 mEq Oral Daily PRN Laurence Slate M, PA-C      . rosuvastatin (CRESTOR) tablet 10 mg  10 mg Oral Daily Laurence Slate M, PA-C   10 mg at 06/04/19 1100  . senna-docusate (Senokot-S) tablet 1 tablet  1 tablet Oral QHS PRN Ulyses Amor, PA-C      . sertraline (ZOLOFT) tablet 250 mg  250 mg Oral QHS Laurence Slate M, PA-C   250 mg at 06/03/19 2208  . sodium phosphate (FLEET) 7-19 GM/118ML enema 1 enema  1 enema Rectal Once PRN Ulyses Amor, PA-C         Discharge Medications: Please see discharge summary for a list of discharge medications.  Relevant Imaging Results:  Relevant Lab Results:   Additional Information SS# [id number]  Bary Castilla, LCSW

## 2019-06-05 LAB — BASIC METABOLIC PANEL
Anion gap: 9 (ref 5–15)
BUN: 12 mg/dL (ref 8–23)
CO2: 20 mmol/L — ABNORMAL LOW (ref 22–32)
Calcium: 8.6 mg/dL — ABNORMAL LOW (ref 8.9–10.3)
Chloride: 106 mmol/L (ref 98–111)
Creatinine, Ser: 0.97 mg/dL (ref 0.61–1.24)
GFR calc Af Amer: >60 mL/min (ref >60)
GFR calc non Af Amer: >60 mL/min (ref >60)
Glucose, Bld: 122 mg/dL — ABNORMAL HIGH (ref 70–99)
Potassium: 4.3 mmol/L (ref 3.5–5.1)
Sodium: 135 mmol/L (ref 135–145)

## 2019-06-05 LAB — CBC WITH DIFFERENTIAL/PLATELET
Abs Immature Granulocytes: 0.12 10*3/uL — ABNORMAL HIGH (ref 0.00–0.07)
Basophils Absolute: 0 10*3/uL (ref 0.0–0.1)
Basophils Relative: 0 %
Eosinophils Absolute: 0.2 10*3/uL (ref 0.0–0.5)
Eosinophils Relative: 2 %
HCT: 25.9 % — ABNORMAL LOW (ref 39.0–52.0)
Hemoglobin: 8.3 g/dL — ABNORMAL LOW (ref 13.0–17.0)
Immature Granulocytes: 1 %
Lymphocytes Relative: 20 %
Lymphs Abs: 2.3 10*3/uL (ref 0.7–4.0)
MCH: 31.8 pg (ref 26.0–34.0)
MCHC: 32 g/dL (ref 30.0–36.0)
MCV: 99.2 fL (ref 80.0–100.0)
Monocytes Absolute: 0.8 10*3/uL (ref 0.1–1.0)
Monocytes Relative: 7 %
Neutro Abs: 7.8 10*3/uL — ABNORMAL HIGH (ref 1.7–7.7)
Neutrophils Relative %: 70 %
Platelets: 242 10*3/uL (ref 150–400)
RBC: 2.61 MIL/uL — ABNORMAL LOW (ref 4.22–5.81)
RDW: 13.4 % (ref 11.5–15.5)
WBC: 11.3 10*3/uL — ABNORMAL HIGH (ref 4.0–10.5)
nRBC: 0 % (ref 0.0–0.2)

## 2019-06-05 LAB — GLUCOSE, CAPILLARY
Glucose-Capillary: 98 mg/dL (ref 70–99)
Glucose-Capillary: 100 mg/dL — ABNORMAL HIGH (ref 70–99)
Glucose-Capillary: 122 mg/dL — ABNORMAL HIGH (ref 70–99)
Glucose-Capillary: 138 mg/dL — ABNORMAL HIGH (ref 70–99)

## 2019-06-05 MED ORDER — INSULIN GLARGINE 100 UNIT/ML ~~LOC~~ SOLN
10.0000 [IU] | Freq: Every day | SUBCUTANEOUS | Status: DC
  Administered 2019-06-05 – 2019-06-06 (×2): 10 [IU] via SUBCUTANEOUS
  Filled 2019-06-05 (×3): qty 0.1

## 2019-06-05 NOTE — Progress Notes (Signed)
  Progress Note    06/05/2019 9:47 AM 3 Days Post-Op  Subjective:  Right lower extremity swelling  Vitals:   06/05/19 0504 06/05/19 0900  BP: 125/65 127/63  Pulse: 74 69  Resp: 19 13  Temp: (!) 97.4 F (36.3 C) (!) 97.1 F (36.2 C)  SpO2: 99% 100%    Physical Exam: Awake alert oriented Swelling in right lower extremity is stable Amputation dressing clean dry intact Strong anterior tibial and posterior tibial signals today  CBC    Component Value Date/Time   WBC 11.3 (H) 06/05/2019 0233   RBC 2.61 (L) 06/05/2019 0233   HGB 8.3 (L) 06/05/2019 0233   HCT 25.9 (L) 06/05/2019 0233   PLT 242 06/05/2019 0233   MCV 99.2 06/05/2019 0233   MCV 96.7 12/23/2016 1549   MCH 31.8 06/05/2019 0233   MCHC 32.0 06/05/2019 0233   RDW 13.4 06/05/2019 0233   LYMPHSABS 2.3 06/05/2019 0233   MONOABS 0.8 06/05/2019 0233   EOSABS 0.2 06/05/2019 0233   BASOSABS 0.0 06/05/2019 0233    BMET    Component Value Date/Time   NA 135 06/05/2019 0233   NA 131 (L) 09/30/2017 1629   K 4.3 06/05/2019 0233   CL 106 06/05/2019 0233   CO2 20 (L) 06/05/2019 0233   GLUCOSE 122 (H) 06/05/2019 0233   BUN 12 06/05/2019 0233   BUN 13 09/30/2017 1629   CREATININE 0.97 06/05/2019 0233   CREATININE 1.14 07/12/2015 1220   CALCIUM 8.6 (L) 06/05/2019 0233   GFRNONAA >60 06/05/2019 0233   GFRNONAA 69 07/12/2015 1220   GFRAA >60 06/05/2019 0233   GFRAA 79 07/12/2015 1220    INR    Component Value Date/Time   INR 1.01 10/23/2016 0528     Intake/Output Summary (Last 24 hours) at 06/05/2019 0947 Last data filed at 06/05/2019 0900 Gross per 24 hour  Intake 1150.25 ml  Output 1950 ml  Net -799.75 ml     Assessment/plan:  68 y.o. male is s/p right extensive common femoral endarterectomy with Dacron patch and femoral to below-knee popliteal artery bypass with graft and amputation of first and second toes.    Heel weightbearing shoe.  On aspirin statin.  Will likely need SNF on  discharge.   Tariya Morrissette C. Donzetta Matters, MD [facility] Office: [phone] Pager: [phone]  06/05/2019 9:47 AM

## 2019-06-05 NOTE — Progress Notes (Signed)
[facility]                                                                              Patient Demographics  Jason [name], is a 68 y.o. male, DOB - Dec 31, 1950, YHC:[id number]  Admit date - 05/27/2019   Admitting Physician Georgette Shell, MD  Outpatient Primary MD for the patient is Patient, [name]  Outpatient specialists:   LOS - 9  days   Medical records reviewed and are as summarized below:  Chief Complaint  Patient presents with  . Foot Pain       Brief summary   Jason [name] is a 68 y.o. male  presented from [place] [facility] with medical history of DM, not on medications for 1 month, b/l Carotid artery disease with R ICA stent, occluded L ICA stent,CVA, COPD, CAD s/p CABG in 2011, and and NSTEMI and DES to sVF- OM2 in 2018, HTN, GERD, nicotine abuse presented with about 4 days of right foot pain followed by a change in color of his 1st toe to black. He admits to fever, chills, nausea and vomiting. He reported subjective fevers/ chill about 1 wk ago. The foot then became red and painful and the toe then became purple and black. His pain is moderate and worse when walking, radiates up his leg. He has had nausea and occasional vomiting for 2 days. He has not been able to pick up his medications in at least 1 month as he lives in [place] and his pharmacy is in [place].  In ED sodium 125, CO2 18, WBCs 13.6.  Right foot x-ray showed no osteomyelitis or gas.  He was admitted under hospital service due to sepsis secondary to right foot cellulitis/gangrene of the first toe with history of PVD.  Blood culture negative to date.  He was started on Zosyn and vancomycin.  He was seen by vascular surgery and they recommended aortogram for which patient was transferred to [facility] on 05/29/2019.  Underwent ultrasound-guided cannulation of the left and right common femoral artery, aortogram with bilateral lower extremity runoff on 05/31/2019.  Findings below  per vascular surgeon note "The aorta and iliac segments are heavily diseased but there does appear to be nonflow-limiting stenosis to the both common femoral arteries.  On the right side which is the site of issue he possibly has a bypass to the profunda the SFA is flush occluded.  He reconstitutes above-knee popliteal.  Appears to have runoff via the anterior tibial and peroneal arteries.  On the left side he has a very diseased SFA.  Appears to occlude his tibioperoneal trunk and has runoff to the ankle via peroneal artery".  Patient underwent following procedures on 06/02/2019. 1.  Right external iliac common femoral and profundofemoral endarterectomy with Dacron patch angioplasty 2.  Right common femoral to below-knee popliteal artery bypass with 6 mm ringed PTFE 3.  Amputation right first and second toes  Assessment & Plan    Principal Problem: All the following problems were present on admission. Sepsis with right foot cellulitis, gangrene of the right first toe with history of PVD, right femoral  endarterectomy 2004, POA -Met sepsis criteria. Blood cultures no growth so far. Given underlying history of PVD, vascular surgery consulted. Patient was seen by Dr. Scot Dock,, recommended to transfer to [facility] for arteriogram and may need amputation of the right great toe after that.  He underwent aortogram which showed severe multivessel peripheral arterial disease. Patient underwent following procedures on 06/02/2019. 1.  Right external iliac common femoral and profundofemoral endarterectomy with Dacron patch angioplasty 2.  Right common femoral to below-knee popliteal artery bypass with 6 mm ringed PTFE 3.  Amputation right first and second toes Antibiotics discontinued on 06/03/2019.  Seen by PT OT.  Recommended SNF.    Active Problems: Diabetes mellitus type 2, uncontrolled with hyperglycemia, noncompliant, with right foot cellulitis, gangrene, POA -Patient reports that he ran out of his  Glucophage over a month ago, used to be on insulin in the past -Hemoglobin A1c 8.5, Blood sugar controlled. Continue 10 units of Lantus and SSI.  Hyponatremia, POA:, has also underlying chronic hyponatremia.  Completely normal now.  History of coronary disease s/p CABG, stent, POA -Continue Plavix, aspirin, metoprolol, statin  Prolonged QTC, trigeminy, POA -QTC 532, EKG changes, extensive cardiac history, continue telemetry, repeat EKG on 06/01/2019 shows QTc of 506.  Hypothyroidism, POA -TSH 5.18, free T4 0.8, ran out of Synthroid, resumed Synthroid 50 mcg.  Depression, POA Ran out of Zoloft, resumed.  Monitor sodium closely.  Essential hypertension, POA Blood pressure fairly stable.,  Continue to hold Imdur, and continue decreased dose of Toprol-XL to 25 mg daily  Nicotine use, POA Patient counseled, continue nicotine patch  Right lower extremity swelling (not present on admission): Doppler negative.  DVT ruled out. Slightly better today. Improving every day.  Code Status: Partial (yes to everything but no intubation) DVT Prophylaxis:  Lovenox  Family Communication: No family present.  Discussed plan of care with patient. Disposition Plan: PT recommends SNF. Case manager aware. Will be discharged once placement arranged.  Procedures:  None  Consultants:   Vascular surgery, Dr. Scot Dock  Antimicrobials:   Anti-infectives (From admission, onward)   Start     Dose/Rate Route Frequency Ordered Stop   06/03/19 0000  vancomycin (VANCOCIN) 1,250 mg in sodium chloride 0.9 % 250 mL IVPB  Status:  Discontinued     1,250 mg 166.7 mL/hr over 90 Minutes Intravenous Every 24 hours 06/02/19 1659 06/03/19 1317   05/29/19 2200  vancomycin (VANCOCIN) 1,500 mg in sodium chloride 0.9 % 500 mL IVPB  Status:  Discontinued     1,500 mg 250 mL/hr over 120 Minutes Intravenous Every 24 hours 05/29/19 1419 06/02/19 1659   05/29/19 2200  metroNIDAZOLE (FLAGYL) IVPB 500 mg  Status:  Discontinued      500 mg 100 mL/hr over 60 Minutes Intravenous Every 8 hours 05/29/19 1427 06/03/19 1344   05/29/19 2200  ceFEPIme (MAXIPIME) 2 g in sodium chloride 0.9 % 100 mL IVPB  Status:  Discontinued     2 g 200 mL/hr over 30 Minutes Intravenous Every 8 hours 05/29/19 1428 06/03/19 1317   05/28/19 2200  vancomycin (VANCOCIN) 1,750 mg in sodium chloride 0.9 % 500 mL IVPB  Status:  Discontinued     1,750 mg 250 mL/hr over 120 Minutes Intravenous Every 24 hours 05/28/19 0610 05/29/19 1419   05/28/19 1000  vancomycin (VANCOCIN) 1,750 mg in sodium chloride 0.9 % 500 mL IVPB  Status:  Discontinued     1,750 mg 250 mL/hr over 120 Minutes Intravenous Every 24 hours 05/27/19 1949 05/28/19  1749   05/27/19 2000  piperacillin-tazobactam (ZOSYN) IVPB 3.375 g  Status:  Discontinued     3.375 g 12.5 mL/hr over 240 Minutes Intravenous Every 8 hours 05/27/19 1500 05/27/19 1941   05/27/19 2000  piperacillin-tazobactam (ZOSYN) IVPB 3.375 g  Status:  Discontinued     3.375 g 12.5 mL/hr over 240 Minutes Intravenous Every 8 hours 05/27/19 1941 05/29/19 1427   05/27/19 1800  piperacillin-tazobactam (ZOSYN) IVPB 3.375 g  Status:  Discontinued     3.375 g 100 mL/hr over 30 Minutes Intravenous  Once 05/27/19 1748 05/27/19 1941   05/27/19 1800  vancomycin (VANCOCIN) 1,500 mg in sodium chloride 0.9 % 500 mL IVPB     1,500 mg 250 mL/hr over 120 Minutes Intravenous  Once 05/27/19 1748 05/28/19 0543   05/27/19 1715  piperacillin-tazobactam (ZOSYN) IVPB 3.375 g  Status:  Discontinued     3.375 g 100 mL/hr over 30 Minutes Intravenous Every 6 hours 05/27/19 1212 05/27/19 1500   05/27/19 1215  piperacillin-tazobactam (ZOSYN) IVPB 3.375 g  Status:  Discontinued     3.375 g 100 mL/hr over 30 Minutes Intravenous Every 6 hours 05/27/19 1208 05/27/19 1212   05/27/19 1100  piperacillin-tazobactam (ZOSYN) IVPB 3.375 g     3.375 g 100 mL/hr over 30 Minutes Intravenous  Once 05/27/19 1055 05/27/19 1155          Medications  Scheduled Meds: . aspirin EC  81 mg Oral Daily  . clopidogrel  75 mg Oral Daily  . docusate sodium  100 mg Oral Daily  . enoxaparin (LOVENOX) injection  40 mg Subcutaneous Q24H  . insulin aspart  0-15 Units Subcutaneous TID WC  . insulin aspart  0-5 Units Subcutaneous QHS  . insulin glargine  10 Units Subcutaneous QHS  . levothyroxine  50 mcg Oral Daily  . metoprolol succinate  25 mg Oral Daily  . nicotine  21 mg Transdermal Q24H  . pantoprazole  40 mg Oral Daily  . rosuvastatin  10 mg Oral Daily  . sertraline  250 mg Oral QHS   Continuous Infusions: . sodium chloride 10 mL/hr at 06/01/19 2143  . sodium chloride    . sodium chloride Stopped (06/04/19 1230)  . magnesium sulfate bolus IVPB     PRN Meds:.sodium chloride, sodium chloride, acetaminophen **OR** acetaminophen, albuterol, alum & mag hydroxide-simeth, bisacodyl, famotidine, guaiFENesin-dextromethorphan, hydrALAZINE, HYDROcodone-acetaminophen, HYDROmorphone (DILAUDID) injection, magnesium sulfate bolus IVPB, ondansetron, oxyCODONE-acetaminophen, phenol, potassium chloride, senna-docusate, sodium phosphate      Subjective:   Seen and examined. Continues to complain of right foot pain but that is improving. No other complaint. Objective:   Vitals:   06/04/19 0350 06/04/19 1100 06/04/19 1935 06/05/19 0504  BP: 116/72 124/69 (!) 100/53 125/65  Pulse: 75 73 65 74  Resp: _0 Temp: 98.1 F (36.7 C)  98.2 F (36.8 C) (!) 97.4 F (36.3 C)  TempSrc: Oral  Oral Oral  SpO2: 97%  98% 99%  Weight:      Height:        Intake/Output Summary (Last 24 hours) at 06/05/2019 0831 Last data filed at 06/05/2019 4496 Gross per 24 hour  Intake 1030.25 ml  Output 2400 ml  Net -1369.75 ml     Wt Readings from Last 3 Encounters:  06/02/19 75.6 kg  03/02/19 77.2 kg  05/06/18 74.1 kg   Physical Exam General exam: Appears calm and comfortable  Respiratory system: Clear to auscultation.  Respiratory effort normal. Cardiovascular system: S1 & S2 heard,  RRR. No JVD, murmurs, rubs, gallops or clicks. +1 pitting edema right lower extremity, trace pitting edema left lower extremity. Gastrointestinal system: Abdomen is nondistended, soft and nontender. No organomegaly or masses felt. Normal bowel sounds heard. Central nervous system: Alert and oriented. No focal neurological deficits. Extremities: Calf tenderness in the right lower extremity. Skin: Dressing in right foot. Direct visualization deferred. Psychiatry: Judgement and insight appear normal. Mood & affect appropriate.          Data Reviewed:  I have personally reviewed following labs and imaging studies  Micro Results Recent Results (from the past 240 hour(s))  Culture, blood (routine x 2)     Status: None   Collection Time: 05/27/19 10:45 AM   Specimen: BLOOD  Result Value Ref Range Status   Specimen Description BLOOD LEFT ANTECUBITAL  Final   Special Requests   Final    BOTTLES DRAWN AEROBIC ONLY Blood Culture results may not be optimal due to an inadequate volume of blood received in culture bottles   Culture   Final    NO GROWTH 5 DAYS Performed at [facility], [place] [address] [place], [place] [address]    Report Status 06/01/2019 FINAL  Final  Culture, blood (routine x 2)     Status: None   Collection Time: 05/27/19 11:00 AM   Specimen: BLOOD RIGHT WRIST  Result Value Ref Range Status   Specimen Description   Final    BLOOD RIGHT WRIST Performed at [facility], [address] [place], [place] [address]    Special Requests   Final    BOTTLES DRAWN AEROBIC AND ANAEROBIC Blood Culture adequate volume Performed at [facility], [address], [place], [place] [address]    Culture   Final    NO GROWTH 5 DAYS Performed at [facility], [place] [address], [place], [place] [address]    Report Status 06/01/2019 FINAL  Final  SARS CORONAVIRUS 2 (TAT 6-24 HRS)  Nasopharyngeal Nasopharyngeal Swab     Status: None   Collection Time: 05/27/19 11:08 AM   Specimen: Nasopharyngeal Swab  Result Value Ref Range Status   SARS Coronavirus 2 NEGATIVE NEGATIVE Final    Comment: (NOTE) SARS-CoV-2 target nucleic acids are NOT DETECTED. The SARS-CoV-2 RNA is generally detectable in upper and lower respiratory specimens during the acute phase of infection. Negative results do not preclude SARS-CoV-2 infection, do not rule out co-infections with other pathogens, and should not be used as the sole basis for treatment or other patient management decisions. Negative results must be combined with clinical observations, patient history, and epidemiological information. The expected result is Negative. Fact Sheet for Patients: SugarRoll.be Fact Sheet for Healthcare Providers: https://www.woods-mathews.com/ This test is not yet approved or cleared by the [place] FDA and  has been authorized for detection and/or diagnosis of SARS-CoV-2 by FDA under an Emergency Use Authorization (EUA). This EUA will remain  in effect (meaning this test can be used) for the duration of the COVID-19 declaration under Section 56 4(b)(1) of the Act, 21 U.S.C. section 360bbb-3(b)(1), unless the authorization is terminated or revoked sooner. Performed at [facility], [place] [address] [place], [place] [address]   Surgical pcr screen     Status: Abnormal   Collection Time: 06/02/19  4:38 AM   Specimen: Nasal Mucosa; Nasal Swab  Result Value Ref Range Status   MRSA, PCR NEGATIVE NEGATIVE Final   Staphylococcus aureus POSITIVE (A) NEGATIVE  Final    Comment: (NOTE) The Xpert SA Assay (FDA approved for NASAL specimens in patients 43 years of age and older), is one component of a comprehensive surveillance program. It is not intended to diagnose infection nor to guide or monitor treatment. Performed at [facility], [place]  [address], [place], [place] [address]     Radiology Reports DG Chest 1 View  Result Date: 05/27/2019 CLINICAL DATA:  Cellulitis. EXAM: CHEST  1 VIEW COMPARISON:  August 01, 2017. FINDINGS: The heart size and mediastinal contours are within normal limits. Both lungs are clear. Status post coronary bypass graft. The visualized skeletal structures are unremarkable. IMPRESSION: No active disease. Electronically Signed   By: Marijo Conception M.D.   On: 05/27/2019 11:34   PERIPHERAL VASCULAR CATHETERIZATION  Result Date: 05/31/2019 Patient name: Jason [name] MRN: [id number] DOB: Sep 06, 1950 Sex: male 05/31/2019 Pre-operative Diagnosis: Critical right lower extremity ischemia with great toe ulceration Post-operative diagnosis:  Same Surgeon:  Erlene Quan C. Donzetta Matters, MD Procedure Performed: 1.  Ultrasound-guided cannulation left common femoral artery 2.  Ultrasound-guided cannulation right common femoral artery 3.  Aortogram with bilateral lower extremity runoff 4.  Moderate sedation with fentanyl and Versed for Indications: 68 year old male here with great toe ulceration on the right.  He has severely depressed ABIs bilaterally.  Has had previous intervention on the right common femoral artery after cardiac catheterization.  Has possibly had greater saphenous vein harvested on the right as well. Findings: I cannot access from the left side because a dissection plane.  From the right side I was able to access.  The aorta and iliac segments are heavily diseased but there does appear to be nonflow-limiting stenosis to the both common femoral arteries.  On the right side which is the site of issue he possibly has a bypass to the profunda the SFA is flush occluded.  He reconstitutes above-knee popliteal.  Appears to have runoff via the anterior tibial and peroneal arteries.  On the left side he has a very diseased SFA.  Appears to occlude his tibioperoneal trunk and has runoff to the ankle via peroneal artery. We will consider  patient for right common femoral to popliteal artery bypass.  He will be vein mapped for bilateral greater saphenous veins.  Procedure:  The patient was identified in the holding area and taken to room 8.  The patient was then placed supine on the table and prepped and draped in the usual sterile fashion.  A time out was called.  Ultrasound was used to evaluate the left common femoral artery.  This was heavily diseased.  The cannulated 1 healthy area with micropuncture needle followed by wire and sheath.  Images saved the permanent record.  The wire would not pass I did use fluoroscopy to get it to pass further.  I placed a micropuncture sheath.  I then used a stiff angled Glidewire.  Unfortunately was in a subintimal plane.  I placed a 5 [place] sheath 10 disease.  Catheter to reenter but could not.  I then used ultrasound to identify the common femoral artery on the right.  This also appeared to be heavily diseased there was scar tissue was possibly a bypass in place.  I cannulated this with direct ultrasound visualization images saved the permanent record.  I did get a Bentson wire to pass.  I then performed retrograde angiography with hand-injection to confirm I was intraluminal in the aorta.  I then placed a Omni catheter to the level  of L1 performed aortogram followed bilateral lower extremity runoff with the above findings.  Patient will be considered for right common femoral to popliteal artery bypass.  We will vein map him.  He will likely need toe amputation as well.  Catheter was removed over wire.  Sheath will be pulled in postoperative holding.  He tolerated procedure well without immediate complication. Contrast: 100cc Brandon C. Donzetta Matters, MD [facility] Office: [phone] Pager: [phone]   DG Foot Complete Right  Result Date: 05/27/2019 CLINICAL DATA:  Cellulitis.  Right foot swelling. EXAM: RIGHT FOOT COMPLETE - 3+ VIEW COMPARISON:  None. FINDINGS: There is no  evidence of fracture or dislocation. There is no evidence of arthropathy or other focal bone abnormality. No lytic destruction is seen to suggest osteomyelitis. Soft tissues are unremarkable. IMPRESSION: Negative. Electronically Signed   By: Marijo Conception M.D.   On: 05/27/2019 11:36   VAS [place] ABI WITH/WO TBI  Result Date: 05/30/2019 LOWER EXTREMITY DOPPLER STUDY Indications: Ulceration, gangrene, and peripheral artery disease. High Risk Factors: Hypertension, hyperlipidemia, Diabetes, current smoker,                    coronary artery disease.  Vascular Interventions: Right ICA stent 03/28/15, CABG 2011. Comparison Study: No prior study on file for comparison Performing Technologist: Sharion Dove RVS  Examination Guidelines: A complete evaluation includes at minimum, Doppler waveform signals and systolic blood pressure reading at the level of bilateral brachial, anterior tibial, and posterior tibial arteries, when vessel segments are accessible. Bilateral testing is considered an integral part of a complete examination. Photoelectric Plethysmograph (PPG) waveforms and toe systolic pressure readings are included as required and additional duplex testing as needed. Limited examinations for reoccurring indications may be performed as noted.  ABI Findings: +---------+------------------+-----+-------------------+------------------+ Right    Rt Pressure (mmHg)IndexWaveform           Comment            +---------+------------------+-----+-------------------+------------------+ Brachial 135                    triphasic                             +---------+------------------+-----+-------------------+------------------+ PTA      54                0.40 dampened monophasic                   +---------+------------------+-----+-------------------+------------------+ DP       40                0.30 monophasic                             +---------+------------------+-----+-------------------+------------------+ Great Toe                                          bandage/ulceration +---------+------------------+-----+-------------------+------------------+ +---------+------------------+-----+-------------------+-------+ Left     Lt Pressure (mmHg)IndexWaveform           Comment +---------+------------------+-----+-------------------+-------+ Brachial 129                    triphasic                  +---------+------------------+-----+-------------------+-------+ PTA  38                0.28 dampened monophasic        +---------+------------------+-----+-------------------+-------+ DP       33                0.24 dampened monophasic        +---------+------------------+-----+-------------------+-------+ Great Toe0                 0.00                    absent  +---------+------------------+-----+-------------------+-------+ +-------+-----------+-----------+------------+------------+ ABI/TBIToday's ABIToday's TBIPrevious ABIPrevious TBI +-------+-----------+-----------+------------+------------+ Right  0.4        wound                               +-------+-----------+-----------+------------+------------+ Left   0.2        absent                              +-------+-----------+-----------+------------+------------+  Summary: Right: Resting right ankle-brachial index indicates severe right lower extremity arterial disease. Left: Resting left ankle-brachial index indicates critical left limb ischemia. The left toe-brachial index is abnormal.  *See table(s) above for measurements and observations.  Electronically signed by Servando Snare MD on 05/30/2019 at 5:01:14 PM.    Final    VAS [place] LOWER EXTREMITY SAPHENOUS VEIN MAPPING  Result Date: 06/01/2019 LOWER EXTREMITY VEIN MAPPING Indications:       ulceration Other Indications: Occluded right lower extremity bypass graft.  Comparison Study: No  priors. Performing Technologist: Oda Cogan RDMS, RVT  Examination Guidelines: A complete evaluation includes B-mode imaging, spectral Doppler, color Doppler, and power Doppler as needed of all accessible portions of each vessel. Bilateral testing is considered an integral part of a complete examination. Limited examinations for reoccurring indications may be performed as noted. +--------------+----------------+-------------------+--------------+-----------+  RT Diameter    RT Findings           GSV         LT Diameter  LT Findings      (cm)                                             (cm)                 +--------------+----------------+-------------------+--------------+-----------+      0.51                       Saphenofemoral        0.58                                                    Junction                                +--------------+----------------+-------------------+--------------+-----------+      0.37                       Proximal thigh        0.59                 +--------------+----------------+-------------------+--------------+-----------+  not visualized      Mid thigh          0.37                                and harvested                                               +--------------+----------------+-------------------+--------------+-----------+                not visualized    Distal thigh         0.43                                and harvested                                               +--------------+----------------+-------------------+--------------+-----------+                not visualized        Knee             0.29                 +--------------+----------------+-------------------+--------------+-----------+      0.26        branching         Prox calf          0.24      branching  +--------------+----------------+-------------------+--------------+-----------+      0.21         branching         Mid calf           0.26                 +--------------+----------------+-------------------+--------------+-----------+      0.24                         Distal calf         0.20                 +--------------+----------------+-------------------+--------------+-----------+      0.27                            Ankle            0.23                 +--------------+----------------+-------------------+--------------+-----------+ Diagnosing physician: Curt Jews MD Electronically signed by Curt Jews MD on 06/01/2019 at 8:44:51 PM.    Final    VAS [place] LOWER EXTREMITY VENOUS (DVT)  Result Date: 06/04/2019  Lower Venous Study Indications: Pain, and Status post right femoral -popliteal bypass graft using the GSV.  Comparison Study: No priors. Performing Technologist: Oda Cogan RDMS, RVT  Examination Guidelines: A complete evaluation includes B-mode imaging, spectral Doppler, color Doppler, and power Doppler as needed of all accessible portions of each vessel. Bilateral testing is considered an integral part of a complete examination. Limited examinations for reoccurring indications may be performed as noted.  +---------+---------------+---------+-----------+----------+--------------+ RIGHT    CompressibilityPhasicitySpontaneityPropertiesThrombus Aging +---------+---------------+---------+-----------+----------+--------------+ CFV  Full           Yes      Yes                                 +---------+---------------+---------+-----------+----------+--------------+ SFJ      Full                                                        +---------+---------------+---------+-----------+----------+--------------+ FV Prox  Full                                                        +---------+---------------+---------+-----------+----------+--------------+ FV Mid   Full                                                         +---------+---------------+---------+-----------+----------+--------------+ FV DistalFull                                                        +---------+---------------+---------+-----------+----------+--------------+ PFV      Full                                                        +---------+---------------+---------+-----------+----------+--------------+ POP      Full           Yes      Yes                                 +---------+---------------+---------+-----------+----------+--------------+ PTV      Full                                                        +---------+---------------+---------+-----------+----------+--------------+ PERO     Full                                                        +---------+---------------+---------+-----------+----------+--------------+   +----+---------------+---------+-----------+----------+--------------+ LEFTCompressibilityPhasicitySpontaneityPropertiesThrombus Aging +----+---------------+---------+-----------+----------+--------------+ CFV Full           Yes      Yes                                 +----+---------------+---------+-----------+----------+--------------+ SFJ Full                                                        +----+---------------+---------+-----------+----------+--------------+  Summary: Right: There is no evidence of deep vein thrombosis in the lower extremity. No cystic structure found in the popliteal fossa. Left: No evidence of common femoral vein obstruction.  *See table(s) above for measurements and observations. Electronically signed by Servando Snare MD on 06/04/2019 at 10:20:16 AM.    Final     Lab Data:  CBC: Recent Labs  Lab 06/02/19 0056 06/02/19 1533 06/02/19 2332 06/03/19 1025 06/04/19 0254 06/05/19 0233  WBC 10.2  --  13.8* 13.5* 13.1* 11.3*  NEUTROABS  --   --   --  11.3* 10.2* 7.8*  HGB 8.6* 10.2* 8.8* 8.8* 8.9* 8.3*  HCT 25.8* 30.0* 26.9* 26.1*  26.9* 25.9*  MCV 96.3  --  98.2 97.0 97.8 99.2  PLT 254  --  212 192 214 216   Basic Metabolic Panel: Recent Labs  Lab 06/01/19 0154 06/02/19 1358 06/02/19 1533 06/02/19 2332 06/03/19 1025 06/04/19 0254 06/05/19 0233  NA 132* 133* 134* 135 132* 136 135  K 4.4 4.6 5.3* 4.9 4.3 5.1 4.3  CL 102 104  --  107 105 107 106  CO2 21*  --   --  19* 18* 20* 20*  GLUCOSE 121* 137*  --  141* 177* 139* 122*  BUN 14 11  --  _0 CREATININE 0.90 0.80  --  1.28* 1.14 1.02 0.97  CALCIUM 9.0  --   --  8.5* 8.2* 8.6* 8.6*   GFR: Estimated Creatinine Clearance: 74.9 mL/min (by C-G formula based on SCr of 0.97 mg/dL). Liver Function Tests: No results for input(s): AST, ALT, ALKPHOS, BILITOT, PROT, ALBUMIN in the last 168 hours. No results for input(s): LIPASE, AMYLASE in the last 168 hours. No results for input(s): AMMONIA in the last 168 hours. Coagulation Profile: No results for input(s): INR, PROTIME in the last 168 hours. Cardiac Enzymes: No results for input(s): CKTOTAL, CKMB, CKMBINDEX, TROPONINI in the last 168 hours. BNP (last 3 results) No results for input(s): PROBNP in the last 8760 hours. HbA1C: No results for input(s): HGBA1C in the last 72 hours. CBG: Recent Labs  Lab 06/04/19 1130 06/04/19 1636 06/04/19 1704 06/04/19 2127 06/05/19 0628  GLUCAP 136* 56* 71 150* 98   Lipid Profile: No results for input(s): CHOL, HDL, LDLCALC, TRIG, CHOLHDL, LDLDIRECT in the last 72 hours. Thyroid Function Tests: No results for input(s): TSH, T4TOTAL, FREET4, T3FREE, THYROIDAB in the last 72 hours. Anemia Panel: No results for input(s): VITAMINB12, FOLATE, FERRITIN, TIBC, IRON, RETICCTPCT in the last 72 hours. Urine analysis:    Component Value Date/Time   COLORURINE YELLOW 07/08/2013 0226   APPEARANCEUR CLEAR 07/08/2013 0226   LABSPEC 1.006 07/08/2013 0226   PHURINE 6.0 07/08/2013 0226   GLUCOSEU NEGATIVE 07/08/2013 0226   HGBUR NEGATIVE 07/08/2013 0226   BILIRUBINUR  NEGATIVE 07/08/2013 0226   KETONESUR NEGATIVE 07/08/2013 0226   PROTEINUR NEGATIVE 07/08/2013 0226   UROBILINOGEN 0.2 07/08/2013 0226   NITRITE NEGATIVE 07/08/2013 0226   LEUKOCYTESUR NEGATIVE 07/08/2013 0226    Total time spent 26 minutes Darliss Cheney M.D. [facility] 06/05/2019, 8:31 AM   Call night coverage person covering after 7pm

## 2019-06-05 NOTE — Plan of Care (Signed)
  Problem: Health Behavior/Discharge Planning: Goal: Ability to manage health-related needs will improve Outcome: Progressing   Problem: Clinical Measurements: Goal: Ability to maintain clinical measurements within normal limits will improve Outcome: Progressing Goal: Will remain free from infection Outcome: Progressing Goal: Diagnostic test results will improve Outcome: Progressing Goal: Respiratory complications will improve Outcome: Progressing Goal: Cardiovascular complication will be avoided Outcome: Progressing   Problem: Activity: Goal: Risk for activity intolerance will decrease Outcome: Progressing   Problem: Nutrition: Goal: Adequate nutrition will be maintained Outcome: Progressing   Problem: Elimination: Goal: Will not experience complications related to bowel motility Outcome: Progressing   Problem: Pain Managment: Goal: General experience of comfort will improve Outcome: Progressing   Problem: Skin Integrity: Goal: Risk for impaired skin integrity will decrease Outcome: Progressing   Problem: Education: Goal: Knowledge of General Education information will improve Description: Including pain rating scale, medication(s)/side effects and non-pharmacologic comfort measures Outcome: Progressing   Problem: Health Behavior/Discharge Planning: Goal: Ability to manage health-related needs will improve Outcome: Progressing   Problem: Clinical Measurements: Goal: Ability to maintain clinical measurements within normal limits will improve Outcome: Progressing Goal: Will remain free from infection Outcome: Progressing Goal: Diagnostic test results will improve Outcome: Progressing Goal: Respiratory complications will improve Outcome: Progressing Goal: Cardiovascular complication will be avoided Outcome: Progressing   Problem: Activity: Goal: Risk for activity intolerance will decrease Outcome: Progressing   Problem: Coping: Goal: Level of anxiety will  decrease Outcome: Progressing   Problem: Elimination: Goal: Will not experience complications related to bowel motility Outcome: Progressing Goal: Will not experience complications related to urinary retention Outcome: Progressing   Problem: Pain Managment: Goal: General experience of comfort will improve Outcome: Progressing   Problem: Safety: Goal: Ability to remain free from injury will improve Outcome: Progressing   Problem: Skin Integrity: Goal: Risk for impaired skin integrity will decrease Outcome: Progressing

## 2019-06-06 LAB — GLUCOSE, CAPILLARY
Glucose-Capillary: 86 mg/dL (ref 70–99)
Glucose-Capillary: 82 mg/dL (ref 70–99)
Glucose-Capillary: 125 mg/dL — ABNORMAL HIGH (ref 70–99)
Glucose-Capillary: 110 mg/dL — ABNORMAL HIGH (ref 70–99)

## 2019-06-06 LAB — CBC WITH DIFFERENTIAL/PLATELET
Abs Immature Granulocytes: 0.14 10*3/uL — ABNORMAL HIGH (ref 0.00–0.07)
Basophils Absolute: 0 10*3/uL (ref 0.0–0.1)
Basophils Relative: 0 %
Eosinophils Absolute: 0.1 10*3/uL (ref 0.0–0.5)
Eosinophils Relative: 1 %
HCT: 26.2 % — ABNORMAL LOW (ref 39.0–52.0)
Hemoglobin: 8.6 g/dL — ABNORMAL LOW (ref 13.0–17.0)
Immature Granulocytes: 1 %
Lymphocytes Relative: 13 %
Lymphs Abs: 1.6 10*3/uL (ref 0.7–4.0)
MCH: 32 pg (ref 26.0–34.0)
MCHC: 32.8 g/dL (ref 30.0–36.0)
MCV: 97.4 fL (ref 80.0–100.0)
Monocytes Absolute: 0.9 10*3/uL (ref 0.1–1.0)
Monocytes Relative: 7 %
Neutro Abs: 9.2 10*3/uL — ABNORMAL HIGH (ref 1.7–7.7)
Neutrophils Relative %: 78 %
Platelets: 281 10*3/uL (ref 150–400)
RBC: 2.69 MIL/uL — ABNORMAL LOW (ref 4.22–5.81)
RDW: 13.3 % (ref 11.5–15.5)
WBC: 11.9 10*3/uL — ABNORMAL HIGH (ref 4.0–10.5)
nRBC: 0 % (ref 0.0–0.2)

## 2019-06-06 LAB — SARS CORONAVIRUS 2 (TAT 6-24 HRS): SARS Coronavirus 2: NEGATIVE

## 2019-06-06 MED ORDER — MUPIROCIN 2 % EX OINT
1.0000 "application " | TOPICAL_OINTMENT | Freq: Two times a day (BID) | CUTANEOUS | Status: DC
  Administered 2019-06-06 – 2019-06-07 (×3): 1 via NASAL
  Filled 2019-06-06: qty 22

## 2019-06-06 MED ORDER — CHLORHEXIDINE GLUCONATE CLOTH 2 % EX PADS
6.0000 | MEDICATED_PAD | Freq: Every day | CUTANEOUS | Status: DC
  Administered 2019-06-06 – 2019-06-07 (×2): 6 via TOPICAL

## 2019-06-06 NOTE — Progress Notes (Addendum)
[facility]  Subjective  - No new complaints.  Tenderness at amputation site.   Objective 132/67 74 97.8 F (36.6 C) (Oral) (!) 25 97%  Intake/Output Summary (Last 24 hours) at 06/06/2019 0721 Last data filed at 06/06/2019 0159 Gross per 24 hour  Intake 1262 ml  Output 700 ml  Net 562 ml    Doppler signals at AT/DP/PT right LE, cont. Edema in right LE GT and second toe amputation site healing well.  C/D dressing applied Lungs non labored breathing Gen NAD  Assessment/Planning: S/P  right extensive common femoral endarterectomy with Dacron patch and femoral to below-knee popliteal artery bypass with graft and amputation of first and second toes.   Heel weight bearing in Darco shoe right LE for ambulation.  Non adherent dressing daily to amputation site right foot.  Pending discharge.  Roxy Horseman 06/06/2019 7:21 AM --  Laboratory Lab Results: Recent Labs    06/05/19 0233 06/06/19 0311  WBC 11.3* 11.9*  HGB 8.3* 8.6*  HCT 25.9* 26.2*  PLT 242 281   BMET Recent Labs    06/04/19 0254 06/05/19 0233  NA 136 135  K 5.1 4.3  CL 107 106  CO2 20* 20*  GLUCOSE 139* 122*  BUN 15 12  CREATININE 1.02 0.97  CALCIUM 8.6* 8.6*    COAG Lab Results  Component Value Date   INR 1.01 10/23/2016   INR 0.94 03/21/2015   INR 0.97 03/09/2015   No results found for: PTT  I have independently interviewed and examined patient and agree with PA assessment and plan above. [place] for d/c from vascular standpoint. Will f/u 06/30/18.  Dyasia Firestine C. Donzetta Matters, MD [facility] Office: [phone] Pager: [phone]

## 2019-06-06 NOTE — Progress Notes (Signed)
Physical Therapy Treatment Patient Details Name: Jason [name] MRN: [id number] DOB: Oct 22, 1950 Today's Date: 06/06/2019    History of Present Illness 68 y.o. male s/p RLE revascularization and 1st and 2nd toe amputation (Heel WB with Darco shoe). Pt with PMH of DM, b/l Carotid artery disease with R ICA stent, occluded L ICA stent,CVA, COPD, CAD s/p CABG in 2011, and and NSTEMI and DES to sVF- OM2 in 2018, HTN, GERD, nicotine abuse.    PT Comments    Pt making steady progress. Continue to recommend ST-SNF prior to return home alone.    Follow Up Recommendations  SNF;Supervision/Assistance - 24 hour     Equipment Recommendations  Rolling walker with 5" wheels    Recommendations for Other Services       Precautions / Restrictions Precautions Precautions: Fall Restrictions Weight Bearing Restrictions: Yes RLE Weight Bearing: (heel WB) Other Position/Activity Restrictions: DARCO shoe not present for eval    Mobility  Bed Mobility               General bed mobility comments: Pt up in recliner  Transfers Overall transfer level: Needs assistance Equipment used: Rolling walker (2 wheeled) Transfers: Sit to/from Stand Sit to Stand: Min assist         General transfer comment: Assist to bring hips up and for balance  Ambulation/Gait Ambulation/Gait assistance: Min assist;Mod assist Gait Distance (Feet): 15 Feet(x 2) Assistive device: Rolling walker (2 wheeled) Gait Pattern/deviations: Step-to pattern;Decreased step length - right;Decreased step length - left;Wide base of support Gait velocity: reduced Gait velocity interpretation: <1.31 ft/sec, indicative of household ambulator General Gait Details: min assist for balance and support except mod assist when pt turning and lost balance. Verbal cues to stay closer to walker. Pt with RLE abducted and externally rotated   Stairs             Wheelchair Mobility    Modified Rankin (Stroke Patients Only)        Balance Overall balance assessment: Needs assistance Sitting-balance support: No upper extremity supported;Feet supported Sitting balance-Leahy Scale: Good     Standing balance support: Bilateral upper extremity supported Standing balance-Leahy Scale: Poor Standing balance comment: walker and min assist for static balance                            Cognition Arousal/Alertness: Awake/alert Behavior During Therapy: WFL for tasks assessed/performed Overall Cognitive Status: Within Functional Limits for tasks assessed                                        Exercises      General Comments        Pertinent Vitals/Pain Pain Assessment: Faces Faces Pain Scale: Hurts a little bit Pain Location: RLE Pain Descriptors / Indicators: Sore Pain Intervention(s): Limited activity within patient's tolerance;Monitored during session;Repositioned    Home Living                      Prior Function            PT Goals (current goals can now be found in the care plan section) Acute Rehab PT Goals Patient Stated Goal: To return to independence Progress towards PT goals: Progressing toward goals    Frequency    Min 2X/week      PT Plan Current plan remains appropriate  Co-evaluation              AM-PAC PT "6 Clicks" Mobility   Outcome Measure  Help needed turning from your back to your side while in a flat bed without using bedrails?: None Help needed moving from lying on your back to sitting on the side of a flat bed without using bedrails?: None Help needed moving to and from a bed to a chair (including a wheelchair)?: A Little Help needed standing up from a chair using your arms (e.g., wheelchair or bedside chair)?: A Little Help needed to walk in hospital room?: A Lot Help needed climbing 3-5 steps with a railing? : Total 6 Click Score: 17    End of Session   Activity Tolerance: Patient tolerated treatment  well Patient left: in chair;with call bell/phone within reach Nurse Communication: Mobility status PT Visit Diagnosis: Other abnormalities of gait and mobility (R26.89);Pain;Muscle weakness (generalized) (M62.81) Pain - Right/Left: Right Pain - part of body: Knee     Time: 1500-1520 PT Time Calculation (min) (ACUTE ONLY): 20 min  Charges:  $Gait Training: 8-22 mins                     [place] Pager [phone] Office [place] 06/06/2019, 5:33 PM

## 2019-06-06 NOTE — Discharge Instructions (Signed)
 [facility]  Discharge instructions  Lower Extremity Bypass Surgery  Please refer to the following instruction for your post-procedure care. Your surgeon or physician assistant will discuss any changes with you.  Activity  You are encouraged to walk as much as you can. You can slowly return to normal activities during the month after your surgery. Avoid strenuous activity and heavy lifting until your doctor tells you it's OK. Avoid activities such as vacuuming or swinging a golf club. Do not drive until your doctor give the OK and you are no longer taking prescription pain medications. It is also normal to have difficulty with sleep habits, eating and bowel movement after surgery. These will go away with time.  Bathing/Showering  You may shower after you go home. Do not soak in a bathtub, hot tub, or swim until the incision heals completely.  Incision Care  Clean your incision with mild soap and water. Shower every day. Pat the area dry with a clean towel. You do not need a bandage unless otherwise instructed. Do not apply any ointments or creams to your incision. If you have open wounds you will be instructed how to care for them or a visiting nurse may be arranged for you. If you have staples or sutures along your incision they will be removed at your post-op appointment. You may have skin glue on your incision. Do not peel it off. It will come off on its own in about one week. If you have a great deal of moisture in your groin, use a gauze help keep this area dry.  Diet  Resume your normal diet. There are no special food restrictions following this procedure. A low fat/ low cholesterol diet is recommended for all patients with vascular disease. In order to heal from your surgery, it is CRITICAL to get adequate nutrition. Your body requires vitamins, minerals, and protein. Vegetables are the best source of vitamins and minerals. Vegetables also provide the  perfect balance of protein. Processed food has little nutritional value, so try to avoid this.  Medications  Resume taking all your medications unless your doctor or nurse practitioner tells you not to. If your incision is causing pain, you may take over-the-counter pain relievers such as acetaminophen (Tylenol). If you were prescribed a stronger pain medication, please aware these medication can cause nausea and constipation. Prevent nausea by taking the medication with a snack or meal. Avoid constipation by drinking plenty of fluids and eating foods with high amount of fiber, such as fruits, vegetables, and grains. Take Colase 100 mg (an over-the-counter stool softener) twice a day as needed for constipation. Do not take Tylenol if you are taking prescription pain medications.  Follow Up  Our office will schedule a follow up appointment 2-3 weeks following discharge.  Please call us immediately for any of the following conditions  Severe or worsening pain in your legs or feet while at rest or while walking Increase pain, redness, warmth, or drainage (pus) from your incision site(s) Fever of 101 degree or higher The swelling in your leg with the bypass suddenly worsens and becomes more painful than when you were in the hospital If you have been instructed to feel your graft pulse then you should do so every day. If you can no longer feel this pulse, call the office immediately. Not all patients are given this instruction.  Leg swelling is common after leg bypass surgery.  The swelling should improve over a few months   following surgery. To improve the swelling, you may elevate your legs above the level of your heart while you are sitting or resting. Your surgeon or physician assistant may ask you to apply an ACE wrap or wear compression (TED) stockings to help to reduce swelling.  Reduce your risk of vascular disease  Stop smoking. If you would like help call [facility] at 1-800-QUIT-NOW  (1-800-784-8669) or [place] at 336-586-4000.  Manage your cholesterol Maintain a desired weight Control your diabetes weight Control your diabetes Keep your blood pressure down  If you have any questions, please call the office at [phone]   

## 2019-06-06 NOTE — Progress Notes (Addendum)
[facility]                                                                              Patient Demographics  Jason [name], is a 68 y.o. male, DOB - 1950/08/14, FUX:[id number]  Admit date - 05/27/2019   Admitting Physician Georgette Shell, MD  Outpatient Primary MD for the patient is Patient, [name]  Outpatient specialists:   LOS - 10  days   Medical records reviewed and are as summarized below:  Chief Complaint  Patient presents with  . Foot Pain       Brief summary   Jason [name] is a 68 y.o. male  presented from [facility] with medical history of DM, not on medications for 1 month, b/l Carotid artery disease with R ICA stent, occluded L ICA stent,CVA, COPD, CAD s/p CABG in 2011, and and NSTEMI and DES to sVF- OM2 in 2018, HTN, GERD, nicotine abuse presented with about 4 days of right foot pain followed by a change in color of his 1st toe to black. He admits to fever, chills, nausea and vomiting. He reported subjective fevers/ chill about 1 wk ago. The foot then became red and painful and the toe then became purple and black. His pain is moderate and worse when walking, radiates up his leg. He has had nausea and occasional vomiting for 2 days. He has not been able to pick up his medications in at least 1 month as he lives in [place] and his pharmacy is in [place].  In ED sodium 125, CO2 18, WBCs 13.6.  Right foot x-ray showed no osteomyelitis or gas.  He was admitted under hospital service due to sepsis secondary to right foot cellulitis/gangrene of the first toe with history of PVD.  Blood culture negative to date.  He was started on Zosyn and vancomycin.  He was seen by vascular surgery and they recommended aortogram for which patient was transferred to [facility] on 05/29/2019.  Underwent ultrasound-guided cannulation of the left and right common femoral artery, aortogram with bilateral lower extremity runoff on 05/31/2019.  Findings  below per vascular surgeon note "The aorta and iliac segments are heavily diseased but there does appear to be nonflow-limiting stenosis to the both common femoral arteries.  On the right side which is the site of issue he possibly has a bypass to the profunda the SFA is flush occluded.  He reconstitutes above-knee popliteal.  Appears to have runoff via the anterior tibial and peroneal arteries.  On the left side he has a very diseased SFA.  Appears to occlude his tibioperoneal trunk and has runoff to the ankle via peroneal artery".  Patient underwent following procedures on 06/02/2019. 1.  Right external iliac common femoral and profundofemoral endarterectomy with Dacron patch angioplasty 2.  Right common femoral to below-knee popliteal artery bypass with 6 mm ringed PTFE 3.  Amputation right first and second toes  Assessment & Plan    Principal Problem: All the following problems were present on admission. Sepsis with right foot cellulitis, gangrene of the right first toe with history of PVD, right femoral  endarterectomy 2004, present on admission. -Met sepsis criteria. Blood cultures no growth so far. Given underlying history of PVD, vascular surgery consulted. Patient was seen by Dr. Scot Dock,, recommended to transfer to [facility] for arteriogram and may need amputation of the right great toe after that.  He underwent aortogram which showed severe multivessel peripheral arterial disease. Patient underwent following procedures on 06/02/2019. 1.  Right external iliac common femoral and profundofemoral endarterectomy with Dacron patch angioplasty 2.  Right common femoral to below-knee popliteal artery bypass with 6 mm ringed PTFE 3.  Amputation right first and second toes Antibiotics discontinued on 06/03/2019.  Seen by PT OT.  Recommended SNF.    Active Problems: Diabetes mellitus type 2, uncontrolled with hyperglycemia, noncompliant, with right foot cellulitis, gangrene, POA -Patient reports  that he ran out of his Glucophage over a month ago, used to be on insulin in the past -Hemoglobin A1c 8.5, Blood sugar controlled. Continue 10 units of Lantus and SSI.  Hyponatremia, POA:, has also underlying chronic hyponatremia.  Completely normal now.  History of coronary disease s/p CABG, stent, POA -Continue Plavix, aspirin, metoprolol, statin  Prolonged QTC, trigeminy, POA -QTC 532, EKG changes, extensive cardiac history, continue telemetry, repeat EKG on 06/01/2019 shows QTc of 506.  Hypothyroidism, POA -TSH 5.18, free T4 0.8, ran out of Synthroid, resumed Synthroid 50 mcg.  Depression, POA Ran out of Zoloft, resumed.  Monitor sodium closely.  Essential hypertension, POA Blood pressure fairly stable.,  Continue to hold Imdur, and continue decreased dose of Toprol-XL to 25 mg daily  Nicotine use, POA Patient counseled, continue nicotine patch  Right lower extremity swelling (not present on admission): Doppler negative.  DVT ruled out. Slightly better today. Improving every day.  Code Status: Partial (yes to everything but no intubation) DVT Prophylaxis:  Lovenox  Family Communication: No family present.  Discussed plan of care with patient. Disposition Plan: PT recommends SNF. Case manager aware.  Has been accepted to SNF.  Ordering Covid test.  Possible discharge tomorrow.  Insurance authorization pending.  Procedures:  1.  Right external iliac common femoral and profundofemoral endarterectomy with Dacron patch angioplasty 2.  Right common femoral to below-knee popliteal artery bypass with 6 mm ringed PTFE 3.  Amputation right first and second toes Antibiotics discontinued on 06/03/2019.  Seen by PT OT.  Recommended SNF.  Consultants:   Vascular surgery, Dr. Scot Dock  Antimicrobials:   Anti-infectives (From admission, onward)   Start     Dose/Rate Route Frequency Ordered Stop   06/03/19 0000  vancomycin (VANCOCIN) 1,250 mg in sodium chloride 0.9 % 250 mL IVPB   Status:  Discontinued     1,250 mg 166.7 mL/hr over 90 Minutes Intravenous Every 24 hours 06/02/19 1659 06/03/19 1317   05/29/19 2200  vancomycin (VANCOCIN) 1,500 mg in sodium chloride 0.9 % 500 mL IVPB  Status:  Discontinued     1,500 mg 250 mL/hr over 120 Minutes Intravenous Every 24 hours 05/29/19 1419 06/02/19 1659   05/29/19 2200  metroNIDAZOLE (FLAGYL) IVPB 500 mg  Status:  Discontinued     500 mg 100 mL/hr over 60 Minutes Intravenous Every 8 hours 05/29/19 1427 06/03/19 1344   05/29/19 2200  ceFEPIme (MAXIPIME) 2 g in sodium chloride 0.9 % 100 mL IVPB  Status:  Discontinued     2 g 200 mL/hr over 30 Minutes Intravenous Every 8 hours 05/29/19 1428 06/03/19 1317   05/28/19 2200  vancomycin (VANCOCIN) 1,750 mg in sodium chloride 0.9 % 500 mL IVPB  Status:  Discontinued     1,750 mg 250 mL/hr over 120 Minutes Intravenous Every 24 hours 05/28/19 0610 05/29/19 1419   05/28/19 1000  vancomycin (VANCOCIN) 1,750 mg in sodium chloride 0.9 % 500 mL IVPB  Status:  Discontinued     1,750 mg 250 mL/hr over 120 Minutes Intravenous Every 24 hours 05/27/19 1949 05/28/19 0610   05/27/19 2000  piperacillin-tazobactam (ZOSYN) IVPB 3.375 g  Status:  Discontinued     3.375 g 12.5 mL/hr over 240 Minutes Intravenous Every 8 hours 05/27/19 1500 05/27/19 1941   05/27/19 2000  piperacillin-tazobactam (ZOSYN) IVPB 3.375 g  Status:  Discontinued     3.375 g 12.5 mL/hr over 240 Minutes Intravenous Every 8 hours 05/27/19 1941 05/29/19 1427   05/27/19 1800  piperacillin-tazobactam (ZOSYN) IVPB 3.375 g  Status:  Discontinued     3.375 g 100 mL/hr over 30 Minutes Intravenous  Once 05/27/19 1748 05/27/19 1941   05/27/19 1800  vancomycin (VANCOCIN) 1,500 mg in sodium chloride 0.9 % 500 mL IVPB     1,500 mg 250 mL/hr over 120 Minutes Intravenous  Once 05/27/19 1748 05/28/19 0543   05/27/19 1715  piperacillin-tazobactam (ZOSYN) IVPB 3.375 g  Status:  Discontinued     3.375 g 100 mL/hr over 30 Minutes Intravenous  Every 6 hours 05/27/19 1212 05/27/19 1500   05/27/19 1215  piperacillin-tazobactam (ZOSYN) IVPB 3.375 g  Status:  Discontinued     3.375 g 100 mL/hr over 30 Minutes Intravenous Every 6 hours 05/27/19 1208 05/27/19 1212   05/27/19 1100  piperacillin-tazobactam (ZOSYN) IVPB 3.375 g     3.375 g 100 mL/hr over 30 Minutes Intravenous  Once 05/27/19 1055 05/27/19 1155         Medications  Scheduled Meds: . aspirin EC  81 mg Oral Daily  . clopidogrel  75 mg Oral Daily  . docusate sodium  100 mg Oral Daily  . enoxaparin (LOVENOX) injection  40 mg Subcutaneous Q24H  . insulin aspart  0-15 Units Subcutaneous TID WC  . insulin aspart  0-5 Units Subcutaneous QHS  . insulin glargine  10 Units Subcutaneous QHS  . levothyroxine  50 mcg Oral Daily  . metoprolol succinate  25 mg Oral Daily  . nicotine  21 mg Transdermal Q24H  . pantoprazole  40 mg Oral Daily  . rosuvastatin  10 mg Oral Daily  . sertraline  250 mg Oral QHS   Continuous Infusions: . sodium chloride 10 mL/hr at 06/01/19 2143  . sodium chloride    . sodium chloride Stopped (06/04/19 1230)  . magnesium sulfate bolus IVPB     PRN Meds:.sodium chloride, sodium chloride, acetaminophen **OR** acetaminophen, albuterol, alum & mag hydroxide-simeth, bisacodyl, famotidine, guaiFENesin-dextromethorphan, hydrALAZINE, HYDROcodone-acetaminophen, HYDROmorphone (DILAUDID) injection, magnesium sulfate bolus IVPB, ondansetron, oxyCODONE-acetaminophen, phenol, potassium chloride, senna-docusate, sodium phosphate      Subjective:   Seen and examined.  Complains of right foot pain, 6 out of 10.  No other complaint. Objective:   Vitals:   06/05/19 0900 06/05/19 2019 06/06/19 0436 06/06/19 0856  BP: 127/63 109/61 132/67 140/60  Pulse: 69 68 74 79  Resp: 13 18 (!) 25 (!) 22  Temp: (!) 97.1 F (36.2 C) 98.3 F (36.8 C) 97.8 F (36.6 C) 98.3 F (36.8 C)  TempSrc: Oral Oral Oral Oral  SpO2: 100% 98% 97% 100%  Weight:      Height:         Intake/Output Summary (Last 24 hours) at 06/06/2019 1018 Last data filed at  06/06/2019 0800 Gross per 24 hour  Intake 1142 ml  Output 875 ml  Net 267 ml     Wt Readings from Last 3 Encounters:  06/02/19 75.6 kg  03/02/19 77.2 kg  05/06/18 74.1 kg   Physical Exam General exam: Appears calm and comfortable  Respiratory system: Clear to auscultation. Respiratory effort normal. Cardiovascular system: S1 & S2 heard, RRR. No JVD, murmurs, rubs, gallops or clicks.  +2 pitting edema right lower extremity, +1 pitting edema left lower extremity.  Right lower extremity calf tenderness. Gastrointestinal system: Abdomen is nondistended, soft and nontender. No organomegaly or masses felt. Normal bowel sounds heard. Central nervous system: Alert and oriented. No focal neurological deficits. Extremities: Symmetric 5 x 5 power. Skin: No rashes, lesions or ulcers.  Psychiatry: Judgement and insight appear normal. Mood & affect appropriate.          Data Reviewed:  I have personally reviewed following labs and imaging studies  Micro Results Recent Results (from the past 240 hour(s))  Culture, blood (routine x 2)     Status: None   Collection Time: 05/27/19 10:45 AM   Specimen: BLOOD  Result Value Ref Range Status   Specimen Description BLOOD LEFT ANTECUBITAL  Final   Special Requests   Final    BOTTLES DRAWN AEROBIC ONLY Blood Culture results may not be optimal due to an inadequate volume of blood received in culture bottles   Culture   Final    NO GROWTH 5 DAYS Performed at [facility], [place] [address] [place], [place] [address]    Report Status 06/01/2019 FINAL  Final  Culture, blood (routine x 2)     Status: None   Collection Time: 05/27/19 11:00 AM   Specimen: BLOOD RIGHT WRIST  Result Value Ref Range Status   Specimen Description   Final    BLOOD RIGHT WRIST Performed at [facility], [place] [place], [place] [address]    Special Requests    Final    BOTTLES DRAWN AEROBIC AND ANAEROBIC Blood Culture adequate volume Performed at [facility], [address], [place], [place] [address]    Culture   Final    NO GROWTH 5 DAYS Performed at [facility], [place] [address], [place], [place] [address]    Report Status 06/01/2019 FINAL  Final  SARS CORONAVIRUS 2 (TAT 6-24 HRS) Nasopharyngeal Nasopharyngeal Swab     Status: None   Collection Time: 05/27/19 11:08 AM   Specimen: Nasopharyngeal Swab  Result Value Ref Range Status   SARS Coronavirus 2 NEGATIVE NEGATIVE Final    Comment: (NOTE) SARS-CoV-2 target nucleic acids are NOT DETECTED. The SARS-CoV-2 RNA is generally detectable in upper and lower respiratory specimens during the acute phase of infection. Negative results do not preclude SARS-CoV-2 infection, do not rule out co-infections with other pathogens, and should not be used as the sole basis for treatment or other patient management decisions. Negative results must be combined with clinical observations, patient history, and epidemiological information. The expected result is Negative. Fact Sheet for Patients: SugarRoll.be Fact Sheet for Healthcare Providers: https://www.woods-mathews.com/ This test is not yet approved or cleared by the [place] FDA and  has been authorized for detection and/or diagnosis of SARS-CoV-2 by FDA under an Emergency Use Authorization (EUA). This EUA will remain  in effect (meaning this test can be used) for the duration of the COVID-19 declaration under Section 56 4(b)(1) of the Act, 21 U.S.C.  section 360bbb-3(b)(1), unless the authorization is terminated or revoked sooner. Performed at [facility], [place] [address] [place], [place] [address]   Surgical pcr screen     Status: Abnormal   Collection Time: 06/02/19  4:38 AM   Specimen: Nasal Mucosa; Nasal Swab  Result Value Ref Range Status   MRSA, PCR NEGATIVE NEGATIVE  Final   Staphylococcus aureus POSITIVE (A) NEGATIVE Final    Comment: (NOTE) The Xpert SA Assay (FDA approved for NASAL specimens in patients 36 years of age and older), is one component of a comprehensive surveillance program. It is not intended to diagnose infection nor to guide or monitor treatment. Performed at [facility], [place] [address] [place], [place] [address]     Radiology Reports DG Chest 1 View  Result Date: 05/27/2019 CLINICAL DATA:  Cellulitis. EXAM: CHEST  1 VIEW COMPARISON:  August 01, 2017. FINDINGS: The heart size and mediastinal contours are within normal limits. Both lungs are clear. Status post coronary bypass graft. The visualized skeletal structures are unremarkable. IMPRESSION: No active disease. Electronically Signed   By: Marijo Conception M.D.   On: 05/27/2019 11:34   PERIPHERAL VASCULAR CATHETERIZATION  Result Date: 05/31/2019 Patient name: Jason [name] MRN: [id number] DOB: 25-May-1951 Sex: male 05/31/2019 Pre-operative Diagnosis: Critical right lower extremity ischemia with great toe ulceration Post-operative diagnosis:  Same Surgeon:  Erlene Quan C. Donzetta Matters, MD Procedure Performed: 1.  Ultrasound-guided cannulation left common femoral artery 2.  Ultrasound-guided cannulation right common femoral artery 3.  Aortogram with bilateral lower extremity runoff 4.  Moderate sedation with fentanyl and Versed for Indications: 68 year old male here with great toe ulceration on the right.  He has severely depressed ABIs bilaterally.  Has had previous intervention on the right common femoral artery after cardiac catheterization.  Has possibly had greater saphenous vein harvested on the right as well. Findings: I cannot access from the left side because a dissection plane.  From the right side I was able to access.  The aorta and iliac segments are heavily diseased but there does appear to be nonflow-limiting stenosis to the both common femoral arteries.  On the right side which  is the site of issue he possibly has a bypass to the profunda the SFA is flush occluded.  He reconstitutes above-knee popliteal.  Appears to have runoff via the anterior tibial and peroneal arteries.  On the left side he has a very diseased SFA.  Appears to occlude his tibioperoneal trunk and has runoff to the ankle via peroneal artery. We will consider patient for right common femoral to popliteal artery bypass.  He will be vein mapped for bilateral greater saphenous veins.  Procedure:  The patient was identified in the holding area and taken to room 8.  The patient was then placed supine on the table and prepped and draped in the usual sterile fashion.  A time out was called.  Ultrasound was used to evaluate the left common femoral artery.  This was heavily diseased.  The cannulated 1 healthy area with micropuncture needle followed by wire and sheath.  Images saved the permanent record.  The wire would not pass I did use fluoroscopy to get it to pass further.  I placed a micropuncture sheath.  I then used a stiff angled Glidewire.  Unfortunately was in a subintimal plane.  I placed a 5 [place] sheath 10 disease.  Catheter to reenter but could not.  I then used ultrasound to identify the common femoral artery  on the right.  This also appeared to be heavily diseased there was scar tissue was possibly a bypass in place.  I cannulated this with direct ultrasound visualization images saved the permanent record.  I did get a Bentson wire to pass.  I then performed retrograde angiography with hand-injection to confirm I was intraluminal in the aorta.  I then placed a Omni catheter to the level of L1 performed aortogram followed bilateral lower extremity runoff with the above findings.  Patient will be considered for right common femoral to popliteal artery bypass.  We will vein map him.  He will likely need toe amputation as well.  Catheter was removed over wire.  Sheath will be pulled in postoperative holding.  He  tolerated procedure well without immediate complication. Contrast: 100cc Brandon C. Donzetta Matters, MD [facility] Office: [phone] Pager: [phone]   DG Foot Complete Right  Result Date: 05/27/2019 CLINICAL DATA:  Cellulitis.  Right foot swelling. EXAM: RIGHT FOOT COMPLETE - 3+ VIEW COMPARISON:  None. FINDINGS: There is no evidence of fracture or dislocation. There is no evidence of arthropathy or other focal bone abnormality. No lytic destruction is seen to suggest osteomyelitis. Soft tissues are unremarkable. IMPRESSION: Negative. Electronically Signed   By: Marijo Conception M.D.   On: 05/27/2019 11:36   VAS [place] ABI WITH/WO TBI  Result Date: 05/30/2019 LOWER EXTREMITY DOPPLER STUDY Indications: Ulceration, gangrene, and peripheral artery disease. High Risk Factors: Hypertension, hyperlipidemia, Diabetes, current smoker,                    coronary artery disease.  Vascular Interventions: Right ICA stent 03/28/15, CABG 2011. Comparison Study: No prior study on file for comparison Performing Technologist: Sharion Dove RVS  Examination Guidelines: A complete evaluation includes at minimum, Doppler waveform signals and systolic blood pressure reading at the level of bilateral brachial, anterior tibial, and posterior tibial arteries, when vessel segments are accessible. Bilateral testing is considered an integral part of a complete examination. Photoelectric Plethysmograph (PPG) waveforms and toe systolic pressure readings are included as required and additional duplex testing as needed. Limited examinations for reoccurring indications may be performed as noted.  ABI Findings: +---------+------------------+-----+-------------------+------------------+ Right    Rt Pressure (mmHg)IndexWaveform           Comment            +---------+------------------+-----+-------------------+------------------+ Brachial 135                    triphasic                              +---------+------------------+-----+-------------------+------------------+ PTA      54                0.40 dampened monophasic                   +---------+------------------+-----+-------------------+------------------+ DP       40                0.30 monophasic                            +---------+------------------+-----+-------------------+------------------+ Great Toe  bandage/ulceration +---------+------------------+-----+-------------------+------------------+ +---------+------------------+-----+-------------------+-------+ Left     Lt Pressure (mmHg)IndexWaveform           Comment +---------+------------------+-----+-------------------+-------+ Brachial 129                    triphasic                  +---------+------------------+-----+-------------------+-------+ PTA      38                0.28 dampened monophasic        +---------+------------------+-----+-------------------+-------+ DP       33                0.24 dampened monophasic        +---------+------------------+-----+-------------------+-------+ Great Toe0                 0.00                    absent  +---------+------------------+-----+-------------------+-------+ +-------+-----------+-----------+------------+------------+ ABI/TBIToday's ABIToday's TBIPrevious ABIPrevious TBI +-------+-----------+-----------+------------+------------+ Right  0.4        wound                               +-------+-----------+-----------+------------+------------+ Left   0.2        absent                              +-------+-----------+-----------+------------+------------+  Summary: Right: Resting right ankle-brachial index indicates severe right lower extremity arterial disease. Left: Resting left ankle-brachial index indicates critical left limb ischemia. The left toe-brachial index is abnormal.  *See table(s) above for measurements and  observations.  Electronically signed by Servando Snare MD on 05/30/2019 at 5:01:14 PM.    Final    VAS [place] LOWER EXTREMITY SAPHENOUS VEIN MAPPING  Result Date: 06/01/2019 LOWER EXTREMITY VEIN MAPPING Indications:       ulceration Other Indications: Occluded right lower extremity bypass graft.  Comparison Study: No priors. Performing Technologist: Oda Cogan RDMS, RVT  Examination Guidelines: A complete evaluation includes B-mode imaging, spectral Doppler, color Doppler, and power Doppler as needed of all accessible portions of each vessel. Bilateral testing is considered an integral part of a complete examination. Limited examinations for reoccurring indications may be performed as noted. +--------------+----------------+-------------------+--------------+-----------+  RT Diameter    RT Findings           GSV         LT Diameter  LT Findings      (cm)                                             (cm)                 +--------------+----------------+-------------------+--------------+-----------+      0.51                       Saphenofemoral        0.58                                                    Junction                                +--------------+----------------+-------------------+--------------+-----------+  0.37                       Proximal thigh        0.59                 +--------------+----------------+-------------------+--------------+-----------+                not visualized      Mid thigh          0.37                                and harvested                                               +--------------+----------------+-------------------+--------------+-----------+                not visualized    Distal thigh         0.43                                and harvested                                               +--------------+----------------+-------------------+--------------+-----------+                not visualized         Knee             0.29                 +--------------+----------------+-------------------+--------------+-----------+      0.26        branching         Prox calf          0.24      branching  +--------------+----------------+-------------------+--------------+-----------+      0.21        branching         Mid calf           0.26                 +--------------+----------------+-------------------+--------------+-----------+      0.24                         Distal calf         0.20                 +--------------+----------------+-------------------+--------------+-----------+      0.27                            Ankle            0.23                 +--------------+----------------+-------------------+--------------+-----------+ Diagnosing physician: Curt Jews MD Electronically signed by Curt Jews MD on 06/01/2019 at 8:44:51 PM.    Final    VAS [place] LOWER EXTREMITY VENOUS (DVT)  Result Date: 06/04/2019  Lower Venous Study Indications: Pain, and Status post right femoral -popliteal bypass graft using the GSV.  Comparison Study: No  priors. Performing Technologist: Oda Cogan RDMS, RVT  Examination Guidelines: A complete evaluation includes B-mode imaging, spectral Doppler, color Doppler, and power Doppler as needed of all accessible portions of each vessel. Bilateral testing is considered an integral part of a complete examination. Limited examinations for reoccurring indications may be performed as noted.  +---------+---------------+---------+-----------+----------+--------------+ RIGHT    CompressibilityPhasicitySpontaneityPropertiesThrombus Aging +---------+---------------+---------+-----------+----------+--------------+ CFV      Full           Yes      Yes                                 +---------+---------------+---------+-----------+----------+--------------+ SFJ      Full                                                         +---------+---------------+---------+-----------+----------+--------------+ FV Prox  Full                                                        +---------+---------------+---------+-----------+----------+--------------+ FV Mid   Full                                                        +---------+---------------+---------+-----------+----------+--------------+ FV DistalFull                                                        +---------+---------------+---------+-----------+----------+--------------+ PFV      Full                                                        +---------+---------------+---------+-----------+----------+--------------+ POP      Full           Yes      Yes                                 +---------+---------------+---------+-----------+----------+--------------+ PTV      Full                                                        +---------+---------------+---------+-----------+----------+--------------+ PERO     Full                                                        +---------+---------------+---------+-----------+----------+--------------+   +----+---------------+---------+-----------+----------+--------------+  LEFTCompressibilityPhasicitySpontaneityPropertiesThrombus Aging +----+---------------+---------+-----------+----------+--------------+ CFV Full           Yes      Yes                                 +----+---------------+---------+-----------+----------+--------------+ SFJ Full                                                        +----+---------------+---------+-----------+----------+--------------+     Summary: Right: There is no evidence of deep vein thrombosis in the lower extremity. No cystic structure found in the popliteal fossa. Left: No evidence of common femoral vein obstruction.  *See table(s) above for measurements and observations. Electronically signed by Servando Snare MD on 06/04/2019 at  10:20:16 AM.    Final     Lab Data:  CBC: Recent Labs  Lab 06/02/19 2332 06/03/19 1025 06/04/19 0254 06/05/19 0233 06/06/19 0311  WBC 13.8* 13.5* 13.1* 11.3* 11.9*  NEUTROABS  --  11.3* 10.2* 7.8* 9.2*  HGB 8.8* 8.8* 8.9* 8.3* 8.6*  HCT 26.9* 26.1* 26.9* 25.9* 26.2*  MCV 98.2 97.0 97.8 99.2 97.4  PLT 212 192 214 242 408   Basic Metabolic Panel: Recent Labs  Lab 06/01/19 0154 06/02/19 1358 06/02/19 1533 06/02/19 2332 06/03/19 1025 06/04/19 0254 06/05/19 0233  NA 132* 133* 134* 135 132* 136 135  K 4.4 4.6 5.3* 4.9 4.3 5.1 4.3  CL 102 104  --  107 105 107 106  CO2 21*  --   --  19* 18* 20* 20*  GLUCOSE 121* 137*  --  141* 177* 139* 122*  BUN 14 11  --  _0 CREATININE 0.90 0.80  --  1.28* 1.14 1.02 0.97  CALCIUM 9.0  --   --  8.5* 8.2* 8.6* 8.6*   GFR: Estimated Creatinine Clearance: 74.9 mL/min (by C-G formula based on SCr of 0.97 mg/dL). Liver Function Tests: No results for input(s): AST, ALT, ALKPHOS, BILITOT, PROT, ALBUMIN in the last 168 hours. No results for input(s): LIPASE, AMYLASE in the last 168 hours. No results for input(s): AMMONIA in the last 168 hours. Coagulation Profile: No results for input(s): INR, PROTIME in the last 168 hours. Cardiac Enzymes: No results for input(s): CKTOTAL, CKMB, CKMBINDEX, TROPONINI in the last 168 hours. BNP (last 3 results) No results for input(s): PROBNP in the last 8760 hours. HbA1C: No results for input(s): HGBA1C in the last 72 hours. CBG: Recent Labs  Lab 06/05/19 0628 06/05/19 1138 06/05/19 1632 06/05/19 2128 06/06/19 0635  GLUCAP 98 100* 122* 138* 86   Lipid Profile: No results for input(s): CHOL, HDL, LDLCALC, TRIG, CHOLHDL, LDLDIRECT in the last 72 hours. Thyroid Function Tests: No results for input(s): TSH, T4TOTAL, FREET4, T3FREE, THYROIDAB in the last 72 hours. Anemia Panel: No results for input(s): VITAMINB12, FOLATE, FERRITIN, TIBC, IRON, RETICCTPCT in the last 72 hours. Urine  analysis:    Component Value Date/Time   COLORURINE YELLOW 07/08/2013 0226   APPEARANCEUR CLEAR 07/08/2013 0226   LABSPEC 1.006 07/08/2013 0226   PHURINE 6.0 07/08/2013 0226   GLUCOSEU NEGATIVE 07/08/2013 0226   HGBUR NEGATIVE 07/08/2013 0226   BILIRUBINUR NEGATIVE 07/08/2013 0226   KETONESUR NEGATIVE 07/08/2013 0226   PROTEINUR NEGATIVE 07/08/2013 0226   UROBILINOGEN 0.2 07/08/2013  0226   NITRITE NEGATIVE 07/08/2013 0226   LEUKOCYTESUR NEGATIVE 07/08/2013 0226    Total time spent 25 minutes Darliss Cheney M.D. [facility] 06/06/2019, 10:18 AM   Call night coverage person covering after 7pm

## 2019-06-06 NOTE — TOC Progression Note (Signed)
Transition of Care [facility]) - Progression Note    Patient Details  Name: AAQIL MASSUCCI MRN: [id number] Date of Birth: 12/01/1950  [facility] CM/SW [place], [place] Phone Number: 06/06/2019, 10:24 AM  Clinical Narrative:     CSW visit with the patient at bedside. CSW provided patient with bed offers. Patient has accepted [facility]  CSW contacted [facility] to confirm bed  Placement. CSW waiting on response.   CSW started insurance authorization process with [employer].   CSW placed 30 day note on shadow chart for MD to sign- PASRR remains pending.  Thurmond Butts, MSW, [facility] Clinical Social Worker [phone]   Expected Discharge Plan: Skilled Nursing Facility Barriers to Discharge: Insurance Authorization, Continued Medical Work up, SNF Pending bed offer  Expected Discharge Plan and Services Expected Discharge Plan: [place] arrangements for the past 2 months: Single Family Home                                       Social Determinants of Health (SDOH) Interventions    Readmission Risk Interventions No flowsheet data found.

## 2019-06-07 ENCOUNTER — Other Ambulatory Visit: Payer: Self-pay | Admitting: Internal Medicine

## 2019-06-07 LAB — CBC WITH DIFFERENTIAL/PLATELET
Abs Immature Granulocytes: 0.13 10*3/uL — ABNORMAL HIGH (ref 0.00–0.07)
Basophils Absolute: 0 10*3/uL (ref 0.0–0.1)
Basophils Relative: 0 %
Eosinophils Absolute: 0.2 10*3/uL (ref 0.0–0.5)
Eosinophils Relative: 2 %
HCT: 27.6 % — ABNORMAL LOW (ref 39.0–52.0)
Hemoglobin: 9.1 g/dL — ABNORMAL LOW (ref 13.0–17.0)
Immature Granulocytes: 1 %
Lymphocytes Relative: 22 %
Lymphs Abs: 2.4 10*3/uL (ref 0.7–4.0)
MCH: 31.9 pg (ref 26.0–34.0)
MCHC: 33 g/dL (ref 30.0–36.0)
MCV: 96.8 fL (ref 80.0–100.0)
Monocytes Absolute: 0.9 10*3/uL (ref 0.1–1.0)
Monocytes Relative: 8 %
Neutro Abs: 7.3 10*3/uL (ref 1.7–7.7)
Neutrophils Relative %: 67 %
Platelets: 327 10*3/uL (ref 150–400)
RBC: 2.85 MIL/uL — ABNORMAL LOW (ref 4.22–5.81)
RDW: 13.2 % (ref 11.5–15.5)
WBC: 11 10*3/uL — ABNORMAL HIGH (ref 4.0–10.5)
nRBC: 0 % (ref 0.0–0.2)

## 2019-06-07 LAB — GLUCOSE, CAPILLARY
Glucose-Capillary: 73 mg/dL (ref 70–99)
Glucose-Capillary: 163 mg/dL — ABNORMAL HIGH (ref 70–99)

## 2019-06-07 MED ORDER — JANUMET 50-500 MG PO TABS: 1.0000 | ORAL_TABLET | Freq: Two times a day (BID) | ORAL | 0 refills | Status: DC

## 2019-06-07 MED ORDER — OXYCODONE-ACETAMINOPHEN 5-325 MG PO TABS: 1.0000 | ORAL_TABLET | ORAL | 0 refills | Status: DC | PRN

## 2019-06-07 MED ORDER — METOPROLOL SUCCINATE ER 25 MG PO TB24: 25.0000 mg | ORAL_TABLET | Freq: Every day | ORAL | 0 refills | Status: DC

## 2019-06-07 MED ORDER — OXYCODONE-ACETAMINOPHEN 5-325 MG PO TABS: 1.0000 | ORAL_TABLET | Freq: Four times a day (QID) | ORAL | 0 refills | Status: DC | PRN

## 2019-06-07 NOTE — Discharge Summary (Signed)
Physician Discharge Summary  Jason [name] [id number] DOB: [birth year] DOA: 05/27/2019  PCP: Patient, [name]  Admit date: 05/27/2019 Discharge date: 06/07/2019  Admitted From: Home Disposition: SNF  Recommendations for Outpatient Follow-up:  1. Follow up with PCP in 1-2 weeks 2. Follow with Dr. Vella Redhead on 07/01/2019 3. Activity per vascular surgery recommendations 4. Please obtain BMP/CBC in one week 5. Please follow up on the following pending results:  Home Health: None Equipment/Devices: None  Discharge Condition: Stable CODE STATUS: Partial code Diet recommendation: Diabetic  Subjective: Seen and examined.  No complaints other than right foot pain which is improving.  Brief/Interim Summary: Jason [name] a 68 y.o.[facility] [place] [facility], not on medications for 1 month, b/l Carotid artery disease with R ICA stent, occluded L ICA stent,CVA, COPD, CAD s/p CABG in 2011, and and NSTEMI and DES to sVF- OM2 in 2018, HTN, GERD, nicotine abuse presented with about 4 days of right foot pain followed by a change in color of his 1st toe to black. He admits to fever, chills, nausea and vomiting. He reported subjective fevers/ chill about 1 wk ago. The foot then became red and painful and the toe then became purple and black. His pain is moderate and worse when walking, radiates up his leg. He has had nausea and occasional vomiting for 2 days. He has not been able to pick up his medications in at least 1 month as he lives in [place] and his pharmacy is in [place]. In ED sodium 125, CO2 18, WBCs 13.6.  Right foot x-ray showed no osteomyelitis or gas.  He was admitted under hospital service due to sepsis secondary to right foot cellulitis/gangrene of the first toe with history of PVD.  Blood culture negative to date.  He was started on Zosyn and vancomycin.  He was seen by vascular surgery and they recommended aortogram for which patient  was transferred to [facility] on 05/29/2019.  Underwent ultrasound-guided cannulation of the left and right common femoral artery, aortogram with bilateral lower extremity runoff on 05/31/2019.  Findings below per vascular surgeon note "The aorta and iliac segments are heavily diseased but there does appear to be nonflow-limiting stenosis to the both common femoral arteries. On the right side which is the site of issue he possibly has a bypass to the profunda the SFA is flush occluded. He reconstitutes above-knee popliteal. Appears to have runoff via the anterior tibial and peroneal arteries. On the left side he has a very diseased SFA. Appears to occlude his tibioperoneal trunk and has runoff to the ankle via peroneal artery".  Patient underwent following procedures on 06/02/2019. 1.Right external iliac common femoral and profundofemoral endarterectomy with Dacron patch angioplasty 2.Right common femoral to below-knee popliteal artery bypass with 6 mm ringed PTFE 3.Amputation right first and second toes. Postprocedure, patient's antibiotics were stopped.  He was followed by vascular surgery.  Seen by PT OT.  They recommended SNF.  This has been arranged for him.  He is going to be discharged in stable condition.  Of note, patient's blood sugar was elevated here while his Metformin was held and he was given 10 units of Lantus along with SSI.  Hemoglobin A1c over 8.  I am discharging him on Janumet 50/500 p.o. twice daily for his diabetes.  Due to soft blood pressure, his Imdur was held for last several days and his Toprol was also reduced to 25 mg p.o. daily all the  way from 100 mg p.o. daily and just on the Toprol, his blood pressure remained fairly controlled so at this point in time he is being discharged on reduced dose of Toprol and Imdur is being discontinued.  He also had right lower extremity swelling and calf tenderness postprocedure for which lower extremity ultrasound was done and it was  ruled out of DVT.  He will follow up with vascular surgery.  Discharge Diagnoses:  Principal Problem:   Cellulitis Active Problems:   DM (diabetes mellitus) ([place])   HTN (hypertension)   PVD, hx Rt femoral endarterectomy 2004   Hyponatremia   Hypothyroidism   Gangrene of toe [facility])    Discharge Instructions  Discharge Instructions    Discharge patient   Complete by: As directed    Discharge disposition: 03-Skilled [place]   Discharge patient date: 06/07/2019     Allergies as of 06/07/2019      Reactions   Coreg [carvedilol] Nausea And Vomiting, Other (See Comments)   Per patient made him dizzy and light sensitive   Sulfa Antibiotics Nausea And Vomiting, Other (See Comments)   Also headaches      Medication List    STOP taking these medications   isosorbide mononitrate 120 MG 24 hr tablet Commonly known as: IMDUR   metFORMIN 1000 MG tablet Commonly known as: GLUCOPHAGE     TAKE these medications   acetaminophen 325 MG tablet Commonly known as: TYLENOL Take 2 tablets (650 mg total) by mouth every 4 (four) hours as needed for headache or mild pain.   albuterol (2.5 MG/3ML) 0.083% nebulizer solution Commonly known as: PROVENTIL USE 1 VIAL IN NEBULIZER EVERY 6 HOURS AS NEEDED What changed: See the new instructions.   aspirin 81 MG tablet Take 1 tablet (81 mg total) by mouth daily.   clopidogrel 75 MG tablet Commonly known as: PLAVIX Take 1 tablet (75 mg total) by mouth daily.   Janumet 50-500 MG tablet Generic drug: sitaGLIPtin-metformin Take 1 tablet by mouth 2 (two) times daily with a meal.   levothyroxine 50 MCG tablet Commonly known as: SYNTHROID TAKE 1 TABLET BY MOUTH EVERY DAY What changed: when to take this   loratadine 10 MG tablet Commonly known as: CLARITIN Take 10 mg by mouth daily.   metoprolol succinate 25 MG 24 hr tablet Commonly known as: TOPROL-XL Take 1 tablet (25 mg total) by mouth daily. Start taking on: June 08, 2019 What changed:   medication strength  how much to take  how to take this  when to take this  additional instructions   nitroGLYCERIN 0.4 MG SL tablet Commonly known as: NITROSTAT PLACE 1 TABLET (0.4 MG TOTAL) UNDER THE TONGUE EVERY 5 (FIVE) MINUTES AS NEEDED FOR CHEST PAIN.   oxyCODONE-acetaminophen 5-325 MG tablet Commonly known as: Percocet Take 1 tablet by mouth every 4 (four) hours as needed for up to 5 days for severe pain.   ranolazine 1000 MG SR tablet Commonly known as: RANEXA TAKE 1 TABLET BY MOUTH 2 TIMES DAILY. NEED OV. What changed:   how much to take  how to take this  when to take this  additional instructions   rosuvastatin 10 MG tablet Commonly known as: CRESTOR Take 1 tablet (10 mg total) by mouth daily.   sertraline 100 MG tablet Commonly known as: ZOLOFT Take 2 and 1/2 tablets by mouth daily What changed:   how much to take  how to take this  when to take this  additional instructions  Follow-up Information    Waynetta Sandy, MD Follow up in 2 week(s).   Specialties: Vascular Surgery, Cardiology Why: office will call Contact information: [address] [place] [address] [phone]          Allergies  Allergen Reactions  . Coreg [Carvedilol] Nausea And Vomiting and Other (See Comments)    Per patient made him dizzy and light sensitive  . Sulfa Antibiotics Nausea And Vomiting and Other (See Comments)    Also headaches     Consultations: Vascular surgery   Procedures/Studies: DG Chest 1 View  Result Date: 05/27/2019 CLINICAL DATA:  Cellulitis. EXAM: CHEST  1 VIEW COMPARISON:  August 01, 2017. FINDINGS: The heart size and mediastinal contours are within normal limits. Both lungs are clear. Status post coronary bypass graft. The visualized skeletal structures are unremarkable. IMPRESSION: No active disease. Electronically Signed   By: Marijo Conception M.D.   On: 05/27/2019 11:34   PERIPHERAL  VASCULAR CATHETERIZATION  Result Date: 05/31/2019 Patient name: Jason [name] MRN: [id number] DOB: [birth year] Sex: male 05/31/2019 Pre-operative Diagnosis: Critical right lower extremity ischemia with great toe ulceration Post-operative diagnosis:  Same Surgeon:  Erlene Quan C. Donzetta Matters, MD Procedure Performed: 1.  Ultrasound-guided cannulation left common femoral artery 2.  Ultrasound-guided cannulation right common femoral artery 3.  Aortogram with bilateral lower extremity runoff 4.  Moderate sedation with fentanyl and Versed for Indications: 68 year old male here with great toe ulceration on the right.  He has severely depressed ABIs bilaterally.  Has had previous intervention on the right common femoral artery after cardiac catheterization.  Has possibly had greater saphenous vein harvested on the right as well. Findings: I cannot access from the left side because a dissection plane.  From the right side I was able to access.  The aorta and iliac segments are heavily diseased but there does appear to be nonflow-limiting stenosis to the both common femoral arteries.  On the right side which is the site of issue he possibly has a bypass to the profunda the SFA is flush occluded.  He reconstitutes above-knee popliteal.  Appears to have runoff via the anterior tibial and peroneal arteries.  On the left side he has a very diseased SFA.  Appears to occlude his tibioperoneal trunk and has runoff to the ankle via peroneal artery. We will consider patient for right common femoral to popliteal artery bypass.  He will be vein mapped for bilateral greater saphenous veins.  Procedure:  The patient was identified in the holding area and taken to room 8.  The patient was then placed supine on the table and prepped and draped in the usual sterile fashion.  A time out was called.  Ultrasound was used to evaluate the left common femoral artery.  This was heavily diseased.  The cannulated 1 healthy area with micropuncture needle  followed by wire and sheath.  Images saved the permanent record.  The wire would not pass I did use fluoroscopy to get it to pass further.  I placed a micropuncture sheath.  I then used a stiff angled Glidewire.  Unfortunately was in a subintimal plane.  I placed a 5 [place] sheath 10 disease.  Catheter to reenter but could not.  I then used ultrasound to identify the common femoral artery on the right.  This also appeared to be heavily diseased there was scar tissue was possibly a bypass in place.  I cannulated this with direct ultrasound visualization images saved the permanent record.  I did get  a Bentson wire to pass.  I then performed retrograde angiography with hand-injection to confirm I was intraluminal in the aorta.  I then placed a Omni catheter to the level of L1 performed aortogram followed bilateral lower extremity runoff with the above findings.  Patient will be considered for right common femoral to popliteal artery bypass.  We will vein map him.  He will likely need toe amputation as well.  Catheter was removed over wire.  Sheath will be pulled in postoperative holding.  He tolerated procedure well without immediate complication. Contrast: 100cc Brandon C. Donzetta Matters, MD [facility] Office: [phone] Pager: [phone]   DG Foot Complete Right  Result Date: 05/27/2019 CLINICAL DATA:  Cellulitis.  Right foot swelling. EXAM: RIGHT FOOT COMPLETE - 3+ VIEW COMPARISON:  None. FINDINGS: There is no evidence of fracture or dislocation. There is no evidence of arthropathy or other focal bone abnormality. No lytic destruction is seen to suggest osteomyelitis. Soft tissues are unremarkable. IMPRESSION: Negative. Electronically Signed   By: Marijo Conception M.D.   On: 05/27/2019 11:36   VAS [place] ABI WITH/WO TBI  Result Date: 05/30/2019 LOWER EXTREMITY DOPPLER STUDY Indications: Ulceration, gangrene, and peripheral artery disease. High Risk Factors: Hypertension, hyperlipidemia,  Diabetes, current smoker,                    coronary artery disease.  Vascular Interventions: Right ICA stent 03/28/15, CABG 2011. Comparison Study: No prior study on file for comparison Performing Technologist: Sharion Dove RVS  Examination Guidelines: A complete evaluation includes at minimum, Doppler waveform signals and systolic blood pressure reading at the level of bilateral brachial, anterior tibial, and posterior tibial arteries, when vessel segments are accessible. Bilateral testing is considered an integral part of a complete examination. Photoelectric Plethysmograph (PPG) waveforms and toe systolic pressure readings are included as required and additional duplex testing as needed. Limited examinations for reoccurring indications may be performed as noted.  ABI Findings: +---------+------------------+-----+-------------------+------------------+ Right    Rt Pressure (mmHg)IndexWaveform           Comment            +---------+------------------+-----+-------------------+------------------+ Brachial 135                    triphasic                             +---------+------------------+-----+-------------------+------------------+ PTA      54                0.40 dampened monophasic                   +---------+------------------+-----+-------------------+------------------+ DP       40                0.30 monophasic                            +---------+------------------+-----+-------------------+------------------+ Great Toe                                          bandage/ulceration +---------+------------------+-----+-------------------+------------------+ +---------+------------------+-----+-------------------+-------+ Left     Lt Pressure (mmHg)IndexWaveform           Comment +---------+------------------+-----+-------------------+-------+ Brachial 129  triphasic                   +---------+------------------+-----+-------------------+-------+ PTA      38                0.28 dampened monophasic        +---------+------------------+-----+-------------------+-------+ DP       33                0.24 dampened monophasic        +---------+------------------+-----+-------------------+-------+ Great Toe0                 0.00                    absent  +---------+------------------+-----+-------------------+-------+ +-------+-----------+-----------+------------+------------+ ABI/TBIToday's ABIToday's TBIPrevious ABIPrevious TBI +-------+-----------+-----------+------------+------------+ Right  0.4        wound                               +-------+-----------+-----------+------------+------------+ Left   0.2        absent                              +-------+-----------+-----------+------------+------------+  Summary: Right: Resting right ankle-brachial index indicates severe right lower extremity arterial disease. Left: Resting left ankle-brachial index indicates critical left limb ischemia. The left toe-brachial index is abnormal.  *See table(s) above for measurements and observations.  Electronically signed by Servando Snare MD on 05/30/2019 at 5:01:14 PM.    Final    VAS [place] LOWER EXTREMITY SAPHENOUS VEIN MAPPING  Result Date: 06/01/2019 LOWER EXTREMITY VEIN MAPPING Indications:       ulceration Other Indications: Occluded right lower extremity bypass graft.  Comparison Study: No priors. Performing Technologist: Oda Cogan RDMS, RVT  Examination Guidelines: A complete evaluation includes B-mode imaging, spectral Doppler, color Doppler, and power Doppler as needed of all accessible portions of each vessel. Bilateral testing is considered an integral part of a complete examination. Limited examinations for reoccurring indications may be performed as noted. +--------------+----------------+-------------------+--------------+-----------+  RT Diameter     RT Findings           GSV         LT Diameter  LT Findings      (cm)                                             (cm)                 +--------------+----------------+-------------------+--------------+-----------+      0.51                       Saphenofemoral        0.58                                                    Junction                                +--------------+----------------+-------------------+--------------+-----------+      0.37  Proximal thigh        0.59                 +--------------+----------------+-------------------+--------------+-----------+                not visualized      Mid thigh          0.37                                and harvested                                               +--------------+----------------+-------------------+--------------+-----------+                not visualized    Distal thigh         0.43                                and harvested                                               +--------------+----------------+-------------------+--------------+-----------+                not visualized        Knee             0.29                 +--------------+----------------+-------------------+--------------+-----------+      0.26        branching         Prox calf          0.24      branching  +--------------+----------------+-------------------+--------------+-----------+      0.21        branching         Mid calf           0.26                 +--------------+----------------+-------------------+--------------+-----------+      0.24                         Distal calf         0.20                 +--------------+----------------+-------------------+--------------+-----------+      0.27                            Ankle            0.23                 +--------------+----------------+-------------------+--------------+-----------+ Diagnosing physician:  Curt Jews MD Electronically signed by Curt Jews MD on 06/01/2019 at 8:44:51 PM.    Final    VAS [place] LOWER EXTREMITY VENOUS (DVT)  Result Date: 06/04/2019  Lower Venous Study Indications: Pain, and Status post right femoral -popliteal bypass graft using the GSV.  Comparison Study: No priors. Performing Technologist: Oda Cogan RDMS, RVT  Examination Guidelines: A complete evaluation includes B-mode imaging, spectral Doppler, color Doppler, and power Doppler  as needed of all accessible portions of each vessel. Bilateral testing is considered an integral part of a complete examination. Limited examinations for reoccurring indications may be performed as noted.  +---------+---------------+---------+-----------+----------+--------------+ RIGHT    CompressibilityPhasicitySpontaneityPropertiesThrombus Aging +---------+---------------+---------+-----------+----------+--------------+ CFV      Full           Yes      Yes                                 +---------+---------------+---------+-----------+----------+--------------+ SFJ      Full                                                        +---------+---------------+---------+-----------+----------+--------------+ FV Prox  Full                                                        +---------+---------------+---------+-----------+----------+--------------+ FV Mid   Full                                                        +---------+---------------+---------+-----------+----------+--------------+ FV DistalFull                                                        +---------+---------------+---------+-----------+----------+--------------+ PFV      Full                                                        +---------+---------------+---------+-----------+----------+--------------+ POP      Full           Yes      Yes                                  +---------+---------------+---------+-----------+----------+--------------+ PTV      Full                                                        +---------+---------------+---------+-----------+----------+--------------+ PERO     Full                                                        +---------+---------------+---------+-----------+----------+--------------+   +----+---------------+---------+-----------+----------+--------------+ LEFTCompressibilityPhasicitySpontaneityPropertiesThrombus Aging +----+---------------+---------+-----------+----------+--------------+ CFV Full  Yes      Yes                                 +----+---------------+---------+-----------+----------+--------------+ SFJ Full                                                        +----+---------------+---------+-----------+----------+--------------+     Summary: Right: There is no evidence of deep vein thrombosis in the lower extremity. No cystic structure found in the popliteal fossa. Left: No evidence of common femoral vein obstruction.  *See table(s) above for measurements and observations. Electronically signed by Servando Snare MD on 06/04/2019 at 10:20:16 AM.    Final       Discharge Exam: Vitals:   06/07/19 0413 06/07/19 0852  BP: 112/62 133/60  Pulse: 62 68  Resp: (!) 24 18  Temp: 98.3 F (36.8 C) 97.6 F (36.4 C)  SpO2: 96% 98%   Vitals:   06/06/19 1519 06/06/19 2005 06/07/19 0413 06/07/19 0852  BP: 130/67 (!) 132/57 112/62 133/60  Pulse: 66 70 62 68  Resp: 19 (!) 21 (!) 24 18  Temp: 97.9 F (36.6 C) 98 F (36.7 C) 98.3 F (36.8 C) 97.6 F (36.4 C)  TempSrc: Oral Oral Oral Oral  SpO2: 98% 99% 96% 98%  Weight:      Height:        General: Pt is alert, awake, not in acute distress Cardiovascular: RRR, S1/S2 +, no rubs, no gallops Respiratory: CTA bilaterally, no wheezing, no rhonchi Abdominal: Soft, NT, ND, bowel sounds + Extremities: Right lower extremity +2  pitting edema.  Trace pitting edema left lower extremity.  Right lower extremity calf tenderness.    The results of significant diagnostics from this hospitalization (including imaging, microbiology, ancillary and laboratory) are listed below for reference.     Microbiology: Recent Results (from the past 240 hour(s))  Surgical pcr screen     Status: Abnormal   Collection Time: 06/02/19  4:38 AM   Specimen: Nasal Mucosa; Nasal Swab  Result Value Ref Range Status   MRSA, PCR NEGATIVE NEGATIVE Final   Staphylococcus aureus POSITIVE (A) NEGATIVE Final    Comment: (NOTE) The Xpert SA Assay (FDA approved for NASAL specimens in patients 8 years of age and older), is one component of a comprehensive surveillance program. It is not intended to diagnose infection nor to guide or monitor treatment. Performed at [facility], [place] [address] [place], [place] [address]   SARS CORONAVIRUS 2 (TAT 6-24 HRS) Nasopharyngeal Nasopharyngeal Swab     Status: None   Collection Time: 06/06/19  1:14 PM   Specimen: Nasopharyngeal Swab  Result Value Ref Range Status   SARS Coronavirus 2 NEGATIVE NEGATIVE Final    Comment: (NOTE) SARS-CoV-2 target nucleic acids are NOT DETECTED. The SARS-CoV-2 RNA is generally detectable in upper and lower respiratory specimens during the acute phase of infection. Negative results do not preclude SARS-CoV-2 infection, do not rule out co-infections with other pathogens, and should not be used as the sole basis for treatment or other patient management decisions. Negative results must be combined with clinical observations, patient history, and epidemiological information. The expected result is Negative. Fact Sheet for Patients: SugarRoll.be Fact Sheet for Healthcare Providers: https://www.woods-mathews.com/ This test  is not yet approved or cleared by the [place] and  has been authorized for detection and/or  diagnosis of SARS-CoV-2 by FDA under an Emergency Use Authorization (EUA). This EUA will remain  in effect (meaning this test can be used) for the duration of the COVID-19 declaration under Section 56 4(b)(1) of the Act, 21 U.S.C. section 360bbb-3(b)(1), unless the authorization is terminated or revoked sooner. Performed at [facility], [place] [address] [place], [place] [address]      Labs: BNP (last 3 results) No results for input(s): BNP in the last 8760 hours. Basic Metabolic Panel: Recent Labs  Lab 06/01/19 0154 06/02/19 1358 06/02/19 1533 06/02/19 2332 06/03/19 1025 06/04/19 0254 06/05/19 0233  NA 132* 133* 134* 135 132* 136 135  K 4.4 4.6 5.3* 4.9 4.3 5.1 4.3  CL 102 104  --  107 105 107 106  CO2 21*  --   --  19* 18* 20* 20*  GLUCOSE 121* 137*  --  141* 177* 139* 122*  BUN 14 11  --  15 15 15 12   CREATININE 0.90 0.80  --  1.28* 1.14 1.02 0.97  CALCIUM 9.0  --   --  8.5* 8.2* 8.6* 8.6*   Liver Function Tests: No results for input(s): AST, ALT, ALKPHOS, BILITOT, PROT, ALBUMIN in the last 168 hours. No results for input(s): LIPASE, AMYLASE in the last 168 hours. No results for input(s): AMMONIA in the last 168 hours. CBC: Recent Labs  Lab 06/03/19 1025 06/04/19 0254 06/05/19 0233 06/06/19 0311 06/07/19 0316  WBC 13.5* 13.1* 11.3* 11.9* 11.0*  NEUTROABS 11.3* 10.2* 7.8* 9.2* 7.3  HGB 8.8* 8.9* 8.3* 8.6* 9.1*  HCT 26.1* 26.9* 25.9* 26.2* 27.6*  MCV 97.0 97.8 99.2 97.4 96.8  PLT 192 214 242 281 327   Cardiac Enzymes: No results for input(s): CKTOTAL, CKMB, CKMBINDEX, TROPONINI in the last 168 hours. BNP: Invalid input(s): POCBNP CBG: Recent Labs  Lab 06/06/19 0635 06/06/19 1106 06/06/19 1624 06/06/19 2153 06/07/19 0610  GLUCAP 86 82 125* 110* 73   D-Dimer No results for input(s): DDIMER in the last 72 hours. Hgb A1c No results for input(s): HGBA1C in the last 72 hours. Lipid Profile No results for input(s): CHOL, HDL, LDLCALC, TRIG,  CHOLHDL, LDLDIRECT in the last 72 hours. Thyroid function studies No results for input(s): TSH, T4TOTAL, T3FREE, THYROIDAB in the last 72 hours.  Invalid input(s): FREET3 Anemia work up No results for input(s): VITAMINB12, FOLATE, FERRITIN, TIBC, IRON, RETICCTPCT in the last 72 hours. Urinalysis    Component Value Date/Time   COLORURINE YELLOW 07/08/2013 0226   APPEARANCEUR CLEAR 07/08/2013 0226   LABSPEC 1.006 07/08/2013 0226   PHURINE 6.0 07/08/2013 0226   GLUCOSEU NEGATIVE 07/08/2013 0226   HGBUR NEGATIVE 07/08/2013 0226   BILIRUBINUR NEGATIVE 07/08/2013 0226   KETONESUR NEGATIVE 07/08/2013 0226   PROTEINUR NEGATIVE 07/08/2013 0226   UROBILINOGEN 0.2 07/08/2013 0226   NITRITE NEGATIVE 07/08/2013 0226   LEUKOCYTESUR NEGATIVE 07/08/2013 0226   Sepsis Labs Invalid input(s): PROCALCITONIN,  WBC,  LACTICIDVEN Microbiology Recent Results (from the past 240 hour(s))  Surgical pcr screen     Status: Abnormal   Collection Time: 06/02/19  4:38 AM   Specimen: Nasal Mucosa; Nasal Swab  Result Value Ref Range Status   MRSA, PCR NEGATIVE NEGATIVE Final   Staphylococcus aureus POSITIVE (A) NEGATIVE Final    Comment: (NOTE) The Xpert SA Assay (FDA approved for NASAL specimens in patients 32 years of age and older),  is one component of a comprehensive surveillance program. It is not intended to diagnose infection nor to guide or monitor treatment. Performed at [facility], [place] [address] [place], [place] [address]   SARS CORONAVIRUS 2 (TAT 6-24 HRS) Nasopharyngeal Nasopharyngeal Swab     Status: None   Collection Time: 06/06/19  1:14 PM   Specimen: Nasopharyngeal Swab  Result Value Ref Range Status   SARS Coronavirus 2 NEGATIVE NEGATIVE Final    Comment: (NOTE) SARS-CoV-2 target nucleic acids are NOT DETECTED. The SARS-CoV-2 RNA is generally detectable in upper and lower respiratory specimens during the acute phase of infection. Negative results do not preclude SARS-CoV-2  infection, do not rule out co-infections with other pathogens, and should not be used as the sole basis for treatment or other patient management decisions. Negative results must be combined with clinical observations, patient history, and epidemiological information. The expected result is Negative. Fact Sheet for Patients: SugarRoll.be Fact Sheet for Healthcare Providers: https://www.woods-mathews.com/ This test is not yet approved or cleared by the [place] FDA and  has been authorized for detection and/or diagnosis of SARS-CoV-2 by FDA under an Emergency Use Authorization (EUA). This EUA will remain  in effect (meaning this test can be used) for the duration of the COVID-19 declaration under Section 56 4(b)(1) of the Act, 21 U.S.C. section 360bbb-3(b)(1), unless the authorization is terminated or revoked sooner. Performed at [facility], [place] [address], [place], [place] [address]      Time coordinating discharge: Over 30 minutes  SIGNED:   Darliss Cheney, MD  [facility] 06/07/2019, 10:01 AM  If 7PM-7AM, please contact night-coverage [email or web address] Password [email or web address]

## 2019-06-07 NOTE — Progress Notes (Addendum)
[facility]  Subjective  - No new complaints   Objective 112/62 62 98.3 F (36.8 C) (Oral) (!) 24 96%  Intake/Output Summary (Last 24 hours) at 06/07/2019 0723 Last data filed at 06/07/2019 0500 Gross per 24 hour  Intake 860 ml  Output 1025 ml  Net -165 ml    Doppler signals brisk AT, right foot incision healing well Mod edema right LE Lungs non labored breathing  Assessment/Planning: S/P right extensive common femoral endarterectomy with Dacron patch and femoral to below-knee popliteal artery bypass with graft and amputation of first and second toes.  OK for discharge f/u with Dr. Donzetta Matters 07/01/2019 Elevation of LE when at rest, heel weight bearing in [place] 06/07/2019 7:23 AM --  Laboratory Lab Results: Recent Labs    06/06/19 0311 06/07/19 0316  WBC 11.9* 11.0*  HGB 8.6* 9.1*  HCT 26.2* 27.6*  PLT 281 327   BMET Recent Labs    06/05/19 0233  NA 135  K 4.3  CL 106  CO2 20*  GLUCOSE 122*  BUN 12  CREATININE 0.97  CALCIUM 8.6*    COAG Lab Results  Component Value Date   INR 1.01 10/23/2016   INR 0.94 03/21/2015   INR 0.97 03/09/2015   No results found for: PTT   I have interviewed and examined patient with PA and agree with assessment and plan above.   Jannis Atkins C. Donzetta Matters, MD [facility] [place] Office: [phone] Pager: [phone]

## 2019-06-07 NOTE — Progress Notes (Signed)
AVS/discharge paperwork given to PTAR. IV removed, clean and intact. Spoke with and gave report to RN at [employer] report. Pt escorted by PTAR.    Arletta Bale, RN

## 2019-06-07 NOTE — TOC Transition Note (Signed)
Transition of Care [facility]) - CM/SW Discharge Note   Patient Details  Name: STORY BRAKEMAN MRN: [id number] Date of Birth: 11-18-1950  Transition of Care [facility]) CM/SW Contact:  Vinie Sill, [place] Phone Number: 06/07/2019, 11:29 AM   Clinical Narrative:     [facility] to[facility] Date: 06/07/2019 Family Notified: Howard,brother Transport By: Corey Harold  RN, patient, and facility notified of DC. Discharge Summary sent to facility. RN given number for [phone]. Ambulance transport requested for patient.   Clinical Social Worker signing off. Thurmond Butts, MSW, [facility] Clinical Social Worker [phone]    Final next level of care: Skilled Nursing Facility Barriers to Discharge: Barriers Resolved   Patient Goals and CMS Choice Patient states their goals for this hospitalization and ongoing recovery are:: To be able to walk better and get around [facility] Medicare.gov Compare Post Acute Care list provided to:: Patient    Discharge Placement PASRR number recieved: 06/04/19            Patient [facility] bed at: [facility] Patient to be transferred to facility by: [place] Name of family member notified: Howard,brother Patient and family notified of of transfer: 06/07/19  Discharge Plan and Services                                     Social Determinants of Health (SDOH) Interventions     Readmission Risk Interventions No flowsheet data found.

## 2019-06-08 ENCOUNTER — Encounter: Payer: Self-pay | Admitting: Internal Medicine

## 2019-06-08 ENCOUNTER — Non-Acute Institutional Stay (SKILLED_NURSING_FACILITY): Payer: Medicare Other | Admitting: Internal Medicine

## 2019-06-08 DIAGNOSIS — E782 Mixed hyperlipidemia: Secondary | ICD-10-CM

## 2019-06-08 DIAGNOSIS — I96 Gangrene, not elsewhere classified: Secondary | ICD-10-CM

## 2019-06-08 DIAGNOSIS — E0810 Diabetes mellitus due to underlying condition with ketoacidosis without coma: Secondary | ICD-10-CM

## 2019-06-08 DIAGNOSIS — I10 Essential (primary) hypertension: Secondary | ICD-10-CM

## 2019-06-08 DIAGNOSIS — E034 Atrophy of thyroid (acquired): Secondary | ICD-10-CM

## 2019-06-08 DIAGNOSIS — I739 Peripheral vascular disease, unspecified: Secondary | ICD-10-CM | POA: Diagnosis not present

## 2019-06-08 NOTE — Progress Notes (Signed)
: Provider:  Hennie Duos., MD Location:  [place] Room Number: 511-P Place of Service:  SNF (31)  PCP: Patient, [name] Patient Care Team: Patient, [name] as PCP - General (General Practice) Troy Sine, MD as PCP - Cardiology (Cardiology) Croitoru, Dani Gobble, MD as Consulting Physician (Cardiology)  Extended Emergency Contact Information Primary Emergency Contact: Lile,Howard Address: [address]           [place], [place] [address] [place] Phone: [phone] Relation: Brother     Allergies: Coreg [carvedilol] and Sulfa antibiotics  Chief Complaint  Patient presents with  . New Admit To SNF    New admission to [facility] SN[facility]    HPI: Patient is a 68 y.o. male with diabetes mellitus not on medications for 1 month, bilateral carotid artery disease with right ICA stent, occluded left ICA stent, CVA, COPD, CAD status post CABG in 2011, and NSTEMI and DES to SVG-OM 2 in 2018, hypertension, GERD, nicotine abuse who presented with a 4-day.  Of right foot pain followed by a change in color of his first toe to black.  Patient admitted fever chills nausea and vomiting of 1 week duration.  Foot first became red then painful then the toe became purple then black.  The pain is worse with walking and radiates up his leg.  He is not helped able to pick up his medications for at least 1 month.  In the ED sodium 125, WBC 13.6.  Patient was admitted to [facility] from 12/14-15 where he was treated for sepsis secondary to right foot cellulitis/gangrene of the right first toe and history of peripheral vascular disease.  He was started on Zosyn and vancomycin and was seen by vascular surgery and they recommended aortogram for which patient was transferred to [facility].  Secondary to aortogram results patient underwent the following procedures on 12/10 #1 right external iliac common femoral and profundofemoral endarterectomy with  Dacron patch angioplasty, right common femoropopliteal bypass with 6 mm ringed PTFE, and amputation right first and second toes.  There was some blood sugar elevation secondary to Glucophage being stopped, patient has some soft blood pressures which led to reduction in some of his medications.  He had right lower extremity swelling and calf tenderness post procedure for which an ultrasound was done and DVT was ruled out.  Patient is admitted to skilled nursing facility for OT/PT.  While at skilled nursing facility patient will be followed for hypothyroidism treated with Synthroid, diabetes mellitus type 2 treated with Janumet, and hyperlipidemia treated with Crestor.  Past Medical History:  Diagnosis Date  . Anxiety    off xanax  and paxil since 3/13  . Arthritis   . Bilateral carotid artery disease ([place])    s/p R ICA stent 03/28/2015 with distal protection. Known chronically occluded L ICA. Carotid stent complicated by hypotension and acute stroke in [place]  . 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 Cataract   . COPD (chronic obstructive pulmonary disease) ([place])   . Coronary artery disease    a. s/p CABG in 2011 with LIMA-LAD, SVG-RI/OM, and SVG-PDA b. occluded SVG-PDA by cath in 2015 c. 10/2016: NSTEMI with cath showing thrombus along the distal graft to insertion of SVG-OM2 with DES placed.   . CVA (cerebral infarction)    occured on 10/10 several days after R ICA carotid stenting  .  Depression   . Diabetes mellitus   . GERD (gastroesophageal reflux disease)   . Heart attack ([place])   . High cholesterol   . Hypertension    type 2 NIDDM  . Myocardial infarct (HCC)    x4 last 10 yrs  . Pneumonia    hx  . 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 Smoker   . Substance abuse [facility])     Past Surgical History:  Procedure Laterality Date  . ABDOMINAL AORTOGRAM W/LOWER EXTREMITY Bilateral 05/31/2019   Procedure: ABDOMINAL AORTOGRAM  W/LOWER EXTREMITY;  Surgeon: Waynetta Sandy, MD;  Location: [place] CV [facility];  Service: Cardiovascular;  Laterality: Bilateral;  . AMPUTATION TOE Right 06/02/2019   Procedure: Amputation Right Great Toe and Second Toe;  Surgeon: Waynetta Sandy, MD;  Location: [place];  Service: Vascular;  Laterality: Right;  . BACK SURGERY  Jan 2014, Dec 2011   Dr Vertell Limber  . CARDIAC CATHETERIZATION  4/12   Medical Rx  . CORONARY ANGIOGRAM  01/17/14   med rx  . CORONARY ANGIOGRAM  4/13   med Rx  . CORONARY ANGIOGRAM  1/15   Med Rx  . CORONARY ANGIOPLASTY  Jan 2004   RCA  . CORONARY ARTERY BYPASS GRAFT  07/24/2009   L-LAD, SVG-RI/OM, SVG-PDA  . CORONARY STENT INTERVENTION N/A 10/23/2016   Procedure: Coronary Stent Intervention;  Surgeon: Peter M Martinique, MD;  Location: [place] CV [facility];  Service: Cardiovascular;  Laterality: N/A;  . ENDARTERECTOMY FEMORAL Right 06/02/2019   Procedure: Endarterectomy External Iliac  Femoral Artery and Profunda;  Surgeon: Waynetta Sandy, MD;  Location: [place];  Service: Vascular;  Laterality: Right;  . EYE SURGERY     cat bil  . FEMORAL ARTERY - FEMORAL ARTERY BYPASS GRAFT Right 2004   femoral enarterectomy  . FEMORAL-POPLITEAL BYPASS GRAFT Right 06/02/2019   Procedure: RIGHT LEG BYPASS GRAFT FEMORAL-POPLITEAL ARTERY using Gore Propaten Vascular Graft Removable Ring;  Surgeon: Waynetta Sandy, MD;  Location: [place];  Service: Vascular;  Laterality: Right;  . LEFT HEART CATH AND CORS/GRAFTS ANGIOGRAPHY N/A 10/23/2016   Procedure: Left Heart Cath and Cors/Grafts Angiography;  Surgeon: Peter M Martinique, MD;  Location: [place] [facility];  Service: Cardiovascular;  Laterality: N/A;  . LEFT HEART CATHETERIZATION WITH CORONARY ANGIOGRAM N/A 10/03/2011   Procedure: LEFT HEART CATHETERIZATION WITH CORONARY ANGIOGRAM;  Surgeon: Pixie Casino, MD;  Location: [facility] CATH LAB;  Service: Cardiovascular;  Laterality: N/A;  . LEFT HEART CATHETERIZATION WITH  CORONARY ANGIOGRAM N/A 07/08/2013   Procedure: LEFT HEART CATHETERIZATION WITH CORONARY ANGIOGRAM;  Surgeon: Blane Ohara, MD;  Location: [facility] CATH LAB;  Service: Cardiovascular;  Laterality: N/A;  . LEFT HEART CATHETERIZATION WITH CORONARY/GRAFT ANGIOGRAM N/A 01/17/2014   Procedure: LEFT HEART CATHETERIZATION WITH Beatrix Fetters;  Surgeon: Troy Sine, MD;  Location: [facility] C[facility] LAB;  Service: Cardiovascular;  Laterality: N/A;  . PATCH ANGIOPLASTY Right 06/02/2019   Procedure: Patch Angioplasty using Hemashield Platinum Finesse Patch of the External Iliac Femoral Artery and Profunda;  Surgeon: Waynetta Sandy, MD;  Location: [place];  Service: Vascular;  Laterality: Right;  . PERIPHERAL VASCULAR CATHETERIZATION N/A 03/28/2015   Procedure: Carotid PTA/Stent Intervention;  Surgeon: Lorretta Harp, MD;  Location: [place] [facility];  Service: Cardiovascular;  Laterality: N/A;  . US EXTREMITY*L*     lft arm tumor removed     Allergies as of 06/08/2019      Reactions  Coreg [carvedilol] Nausea And Vomiting, Other (See Comments)   Per patient made him dizzy and light sensitive   Sulfa Antibiotics Nausea And Vomiting, Other (See Comments)   Also headaches      Medication List       Accurate as of June 08, 2019 11:34 AM. If you have any questions, ask your nurse or doctor.        STOP taking these medications   acetaminophen 325 MG tablet Commonly known as: TYLENOL Stopped by: Inocencio Homes, MD   albuterol (2.5 MG/3ML) 0.083% nebulizer solution Commonly known as: PROVENTIL Stopped by: Inocencio Homes, MD     TAKE these medications   aspirin 81 MG tablet Take 1 tablet (81 mg total) by mouth daily.   clopidogrel 75 MG tablet Commonly known as: PLAVIX Take 1 tablet (75 mg total) by mouth daily.   Janumet 50-500 MG tablet Generic drug: sitaGLIPtin-metformin Take 1 tablet by mouth 2 (two) times daily with a meal.   levothyroxine 50 MCG tablet Commonly  known as: SYNTHROID TAKE 1 TABLET BY MOUTH EVERY DAY   loratadine 10 MG tablet Commonly known as: CLARITIN Take 10 mg by mouth daily.   metoprolol succinate 25 MG 24 hr tablet Commonly known as: TOPROL-XL Take 1 tablet (25 mg total) by mouth daily.   nitroGLYCERIN 0.4 MG SL tablet Commonly known as: NITROSTAT PLACE 1 TABLET (0.4 MG TOTAL) UNDER THE TONGUE EVERY 5 (FIVE) MINUTES AS NEEDED FOR CHEST PAIN.   oxyCODONE-acetaminophen 5-325 MG tablet Commonly known as: PERCOCET/ROXICET Take 1 tablet by mouth every 6 (six) hours as needed for severe pain.   ranolazine 1000 MG SR tablet Commonly known as: RANEXA TAKE 1 TABLET BY MOUTH 2 TIMES DAILY. NEED OV.   rosuvastatin 10 MG tablet Commonly known as: CRESTOR Take 10 mg by mouth daily. What changed: Another medication with the same name was removed. Continue taking this medication, and follow the directions you see here. Changed by: Inocencio Homes, MD   sertraline 100 MG tablet Commonly known as: ZOLOFT Take 2 and 1/2 tablets by mouth daily       No orders of the defined types were placed in this encounter.   Immunization History  Administered Date(s) Administered  . Influenza Inj Mdck Quad Pf 04/27/2017  . Influenza Split 07/17/2012  . Influenza, High Dose Seasonal PF 05/06/2018, 02/15/2019  . Influenza,inj,Quad PF,6+ Mos 07/12/2015  . Influenza-Unspecified 04/23/2017  . Pneumococcal Polysaccharide-23 03/30/2015    Social History   Tobacco Use  . Smoking status: Current Every Day Smoker    Packs/day: 1.00    Years: 48.00    Pack years: 48.00    Types: Cigarettes  . Smokeless tobacco: Never Used  Substance Use Topics  . Alcohol use: Yes    Alcohol/week: 0.0 standard drinks    Comment: socially    Family history is   Family History  Problem Relation Age of Onset  . Arthritis Mother   . Heart disease Father       Review of Systems   GENERAL:  no fevers, fatigue, appetite changes SKIN: No itching,  or rash EYES: No eye pain, redness, discharge EARS: No earache, tinnitus, change in hearing NOSE: No congestion, drainage or bleeding  MOUTH/THROAT: No mouth or tooth pain, No sore throat RESPIRATORY: No cough, wheezing, SOB CARDIAC: No chest pain, palpitations, lower extremity edema  GI: No abdominal pain, No N/V/D or constipation, No heartburn or reflux  GU: No dysuria, frequency or urgency, or incontinence  MUSCULOSKELETAL: No unrelieved bone/joint pain NEUROLOGIC: No headache, dizziness or focal weakness PSYCHIATRIC: No c/o anxiety or sadness   Vitals:   06/08/19 1100  BP: 129/67  Pulse: 68  Resp: 18  Temp: (!) 97.4 F (36.3 C)    SpO2 Readings from Last 1 Encounters:  06/07/19 98%   Body mass index is 24.6 kg/m.     Physical Exam  GENERAL APPEARANCE: Alert,   No acute distress.  SKIN: No diaphoresis rash HEAD: Normocephalic, atraumatic  EYES: Conjunctiva/lids clear. Pupils round, reactive. EOMs intact.  EARS: External exam WNL, canals clear. Hearing grossly normal.  NOSE: No deformity or discharge.  MOUTH/THROAT: Lips w/o lesions  RESPIRATORY: Breathing is even, unlabored. Lung sounds are clear   CARDIOVASCULAR: Heart RRR no murmurs, rubs or gallops. No peripheral edema right leg in dressing and boot.   GASTROINTESTINAL: Abdomen is soft, non-tender, not distended w/ normal bowel sounds. GENITOURINARY: Bladder non tender, not distended  MUSCULOSKELETAL: No abnormal joints or musculature NEUROLOGIC:  Cranial nerves 2-12 grossly intact. Moves all extremities  PSYCHIATRIC: Mood and affect appropriate to situation, no behavioral issues  Patient Active Problem List   Diagnosis Date Noted  . Cellulitis 05/27/2019  . Gangrene of toe ([place]) 05/27/2019  . Status post lumbar spinal fusion 10/27/2017  . Acute on chronic combined systolic and diastolic CHF (congestive heart failure) ([place]) 10/25/2016  . NSTEMI (non-ST elevated myocardial infarction) ([place]) 10/22/2016  .  Hypothyroidism 08/17/2015  . RBBB 05/11/2015  . Hyponatremia 04/04/2015  . Hypotension 04/04/2015  . Hemispheric carotid artery syndrome   . Carotid artery narrowing 03/28/2015  . Carotid stenosis- moderate 2011, 95% 2016 s/p stent 02/21/2014  . Unstable angina ([place]) 01/16/2014  . Tobacco abuse 01/16/2014  . Substance abuse in remission ([place]) 10/01/2012  . Radiculitis 02/10/2012  . CAD, CABG Feb 2011, cath x 4 since-medical Rx 10/03/2011  . PVD, hx Rt femoral endarterectomy 2004 10/03/2011  . DM (diabetes mellitus) ([place]) 10/02/2011  . HTN (hypertension) 10/02/2011  . Hyperlipidemia 10/02/2011      Labs reviewed: Basic Metabolic Panel:    Component Value Date/Time   NA 135 06/05/2019 0233   NA 131 (L) 09/30/2017 1629   K 4.3 06/05/2019 0233   CL 106 06/05/2019 0233   CO2 20 (L) 06/05/2019 0233   GLUCOSE 122 (H) 06/05/2019 0233   BUN 12 06/05/2019 0233   BUN 13 09/30/2017 1629   CREATININE 0.97 06/05/2019 0233   CREATININE 1.14 07/12/2015 1220   CALCIUM 8.6 (L) 06/05/2019 0233   PROT 6.8 09/30/2017 1629   ALBUMIN 4.3 09/30/2017 1629   AST 17 09/30/2017 1629   ALT 12 09/30/2017 1629   ALKPHOS 74 09/30/2017 1629   BILITOT 0.3 09/30/2017 1629   GFRNONAA >60 06/05/2019 0233   GFRNONAA 69 07/12/2015 1220   GFRAA >60 06/05/2019 0233   GFRAA 79 07/12/2015 1220    Recent Labs    06/03/19 1025 06/04/19 0254 06/05/19 0233  NA 132* 136 135  K 4.3 5.1 4.3  CL 105 107 106  CO2 18* 20* 20*  GLUCOSE 177* 139* 122*  BUN 15 15 12   CREATININE 1.14 1.02 0.97  CALCIUM 8.2* 8.6* 8.6*   Liver Function Tests: No results for input(s): AST, ALT, ALKPHOS, BILITOT, PROT, ALBUMIN in the last 8760 hours. No results for input(s): LIPASE, AMYLASE in the last 8760 hours. No results for input(s): AMMONIA in the last 8760 hours. CBC: Recent Labs    06/05/19 0233 06/06/19 0311 06/07/19 0316  WBC 11.3*  11.9* 11.0*  NEUTROABS 7.8* 9.2* 7.3  HGB 8.3* 8.6* 9.1*  HCT 25.9* 26.2* 27.6*   MCV 99.2 97.4 96.8  PLT 242 281 327   Lipid No results for input(s): CHOL, HDL, LDLCALC, TRIG in the last 8760 hours.  Cardiac Enzymes: No results for input(s): CKTOTAL, CKMB, CKMBINDEX, TROPONINI in the last 8760 hours. BNP: No results for input(s): BNP in the last 8760 hours. Lab Results  Component Value Date   MICROALBUR 0.7 07/12/2015   Lab Results  Component Value Date   HGBA1C 8.5 (H) 05/27/2019   Lab Results  Component Value Date   TSH 5.180 (H) 05/27/2019   No results found for: VITAMINB12 No results found for: FOLATE No results found for: IRON, TIBC, FERRITIN  Imaging and Procedures obtained prior to SNF admission: DG Chest 1 View  Result Date: 05/27/2019 CLINICAL DATA:  Cellulitis. EXAM: CHEST  1 VIEW COMPARISON:  August 01, 2017. FINDINGS: The heart size and mediastinal contours are within normal limits. Both lungs are clear. Status post coronary bypass graft. The visualized skeletal structures are unremarkable. IMPRESSION: No active disease. Electronically Signed   By: Marijo Conception M.D.   On: 05/27/2019 11:34   DG Foot Complete Right  Result Date: 05/27/2019 CLINICAL DATA:  Cellulitis.  Right foot swelling. EXAM: RIGHT FOOT COMPLETE - 3+ VIEW COMPARISON:  None. FINDINGS: There is no evidence of fracture or dislocation. There is no evidence of arthropathy or other focal bone abnormality. No lytic destruction is seen to suggest osteomyelitis. Soft tissues are unremarkable. IMPRESSION: Negative. Electronically Signed   By: Marijo Conception M.D.   On: 05/27/2019 11:36     Not all labs, radiology exams or other studies done during hospitalization come through on my EPIC note; however they are reviewed by me.    Assessment and Plan  Peripheral arterial disease/right great toe gangrene-patient underwent 3 procedures on 12/10 #1 right external iliac common femoral and profundofemoral endarterectomy with Dacron patch angioplasty #2 right common femoral to below  the knee popliteal artery bypass with 6 mm ringed PTFE #3 amputation right first and second toes Postop patient has swelling of right lower extremity and calf tenderness but lower extremity ultrasound ruled out DVT SNF-admitted for OT/PT continue wound care continue ASA 81 mg daily and Plavix 75 mg daily; patient is on statin  Diabetes mellitus-A1c 7.0 SNF-start Janumet 50-501 p.o. twice daily; patient is on statin  Hypothyroidism SNF-not stated as uncontrolled; continue 50 mcg daily  Hyperlipidemia status uncontrolled; continue Crestor 10 mg daily  Hypertension SNF-acceptable; continue metoprolol XL 25 mg daily   Time spent greater than 45 minutes;> 50% of time with patient was spent reviewing records, labs, tests and studies, counseling and developing plan of care  Hennie Duos, MD

## 2019-06-11 ENCOUNTER — Encounter: Payer: Self-pay | Admitting: Internal Medicine

## 2019-06-11 ENCOUNTER — Encounter (HOSPITAL_COMMUNITY): Payer: Self-pay

## 2019-06-11 ENCOUNTER — Inpatient Hospital Stay (HOSPITAL_COMMUNITY)
Admission: EM | Admit: 2019-06-11 | Discharge: 2019-06-16 | DRG: 565 | Disposition: A | Discharge status: Skilled Nursing Facility | Payer: Medicare Other | Source: Skilled Nursing Facility | Attending: Internal Medicine | Admitting: Internal Medicine

## 2019-06-11 ENCOUNTER — Other Ambulatory Visit: Payer: Self-pay

## 2019-06-11 ENCOUNTER — Inpatient Hospital Stay (HOSPITAL_COMMUNITY): Payer: Medicare Other

## 2019-06-11 ENCOUNTER — Emergency Department (HOSPITAL_COMMUNITY): Payer: Medicare Other

## 2019-06-11 DIAGNOSIS — T8743 Infection of amputation stump, right lower extremity: Principal | ICD-10-CM | POA: Diagnosis present

## 2019-06-11 DIAGNOSIS — Z7982 Long term (current) use of aspirin: Secondary | ICD-10-CM | POA: Diagnosis not present

## 2019-06-11 DIAGNOSIS — F329 Major depressive disorder, single episode, unspecified: Secondary | ICD-10-CM | POA: Diagnosis present

## 2019-06-11 DIAGNOSIS — Z20828 Contact with and (suspected) exposure to other viral communicable diseases: Secondary | ICD-10-CM | POA: Diagnosis present

## 2019-06-11 DIAGNOSIS — E039 Hypothyroidism, unspecified: Secondary | ICD-10-CM | POA: Diagnosis present

## 2019-06-11 DIAGNOSIS — M869 Osteomyelitis, unspecified: Secondary | ICD-10-CM

## 2019-06-11 DIAGNOSIS — I252 Old myocardial infarction: Secondary | ICD-10-CM

## 2019-06-11 DIAGNOSIS — L03115 Cellulitis of right lower limb: Secondary | ICD-10-CM | POA: Diagnosis present

## 2019-06-11 DIAGNOSIS — R52 Pain, unspecified: Secondary | ICD-10-CM | POA: Diagnosis not present

## 2019-06-11 DIAGNOSIS — E119 Type 2 diabetes mellitus without complications: Secondary | ICD-10-CM

## 2019-06-11 DIAGNOSIS — Y835 Amputation of limb(s) as the cause of abnormal reaction of the patient, or of later complication, without mention of misadventure at the time of the procedure: Secondary | ICD-10-CM | POA: Diagnosis present

## 2019-06-11 DIAGNOSIS — Z79899 Other long term (current) drug therapy: Secondary | ICD-10-CM

## 2019-06-11 DIAGNOSIS — E034 Atrophy of thyroid (acquired): Secondary | ICD-10-CM | POA: Diagnosis not present

## 2019-06-11 DIAGNOSIS — Z8673 Personal history of transient ischemic attack (TIA), and cerebral infarction without residual deficits: Secondary | ICD-10-CM | POA: Diagnosis not present

## 2019-06-11 DIAGNOSIS — E878 Other disorders of electrolyte and fluid balance, not elsewhere classified: Secondary | ICD-10-CM | POA: Diagnosis present

## 2019-06-11 DIAGNOSIS — Z720 Tobacco use: Secondary | ICD-10-CM

## 2019-06-11 DIAGNOSIS — E11628 Type 2 diabetes mellitus with other skin complications: Secondary | ICD-10-CM | POA: Diagnosis present

## 2019-06-11 DIAGNOSIS — L0390 Cellulitis, unspecified: Secondary | ICD-10-CM

## 2019-06-11 DIAGNOSIS — I25110 Atherosclerotic heart disease of native coronary artery with unstable angina pectoris: Secondary | ICD-10-CM | POA: Diagnosis present

## 2019-06-11 DIAGNOSIS — E1165 Type 2 diabetes mellitus with hyperglycemia: Secondary | ICD-10-CM | POA: Diagnosis present

## 2019-06-11 DIAGNOSIS — E785 Hyperlipidemia, unspecified: Secondary | ICD-10-CM | POA: Diagnosis present

## 2019-06-11 DIAGNOSIS — E1151 Type 2 diabetes mellitus with diabetic peripheral angiopathy without gangrene: Secondary | ICD-10-CM | POA: Diagnosis present

## 2019-06-11 DIAGNOSIS — I739 Peripheral vascular disease, unspecified: Secondary | ICD-10-CM | POA: Insufficient documentation

## 2019-06-11 DIAGNOSIS — I200 Unstable angina: Secondary | ICD-10-CM | POA: Diagnosis present

## 2019-06-11 DIAGNOSIS — E871 Hypo-osmolality and hyponatremia: Secondary | ICD-10-CM | POA: Diagnosis present

## 2019-06-11 DIAGNOSIS — J449 Chronic obstructive pulmonary disease, unspecified: Secondary | ICD-10-CM | POA: Diagnosis present

## 2019-06-11 DIAGNOSIS — Z8249 Family history of ischemic heart disease and other diseases of the circulatory system: Secondary | ICD-10-CM

## 2019-06-11 DIAGNOSIS — I25118 Atherosclerotic heart disease of native coronary artery with other forms of angina pectoris: Secondary | ICD-10-CM | POA: Diagnosis not present

## 2019-06-11 DIAGNOSIS — F17210 Nicotine dependence, cigarettes, uncomplicated: Secondary | ICD-10-CM | POA: Diagnosis present

## 2019-06-11 DIAGNOSIS — I10 Essential (primary) hypertension: Secondary | ICD-10-CM | POA: Diagnosis present

## 2019-06-11 DIAGNOSIS — Z7984 Long term (current) use of oral hypoglycemic drugs: Secondary | ICD-10-CM

## 2019-06-11 DIAGNOSIS — I6529 Occlusion and stenosis of unspecified carotid artery: Secondary | ICD-10-CM | POA: Diagnosis present

## 2019-06-11 DIAGNOSIS — D649 Anemia, unspecified: Secondary | ICD-10-CM | POA: Diagnosis present

## 2019-06-11 DIAGNOSIS — M25551 Pain in right hip: Secondary | ICD-10-CM | POA: Diagnosis present

## 2019-06-11 DIAGNOSIS — Z79890 Hormone replacement therapy (postmenopausal): Secondary | ICD-10-CM | POA: Diagnosis not present

## 2019-06-11 DIAGNOSIS — L089 Local infection of the skin and subcutaneous tissue, unspecified: Secondary | ICD-10-CM

## 2019-06-11 DIAGNOSIS — Z7902 Long term (current) use of antithrombotics/antiplatelets: Secondary | ICD-10-CM

## 2019-06-11 DIAGNOSIS — Z9181 History of falling: Secondary | ICD-10-CM

## 2019-06-11 LAB — I-STAT CHEM 8, ED
BUN: 14 mg/dL (ref 8–23)
Calcium, Ion: 1.2 mmol/L (ref 1.15–1.40)
Chloride: 95 mmol/L — ABNORMAL LOW (ref 98–111)
Creatinine, Ser: 1.2 mg/dL (ref 0.61–1.24)
Glucose, Bld: 95 mg/dL (ref 70–99)
HCT: 31 % — ABNORMAL LOW (ref 39.0–52.0)
Hemoglobin: 10.5 g/dL — ABNORMAL LOW (ref 13.0–17.0)
Potassium: 4.9 mmol/L (ref 3.5–5.1)
Sodium: 131 mmol/L — ABNORMAL LOW (ref 135–145)
TCO2: 26 mmol/L (ref 22–32)

## 2019-06-11 LAB — RESPIRATORY PANEL BY RT PCR (FLU A&B, COVID)
Influenza A by PCR: NEGATIVE
Influenza B by PCR: NEGATIVE
SARS Coronavirus 2 by RT PCR: NEGATIVE

## 2019-06-11 LAB — CBC WITH DIFFERENTIAL/PLATELET
Abs Immature Granulocytes: 0.1 10*3/uL — ABNORMAL HIGH (ref 0.00–0.07)
Basophils Absolute: 0.1 10*3/uL (ref 0.0–0.1)
Basophils Relative: 0 %
Eosinophils Absolute: 0.3 10*3/uL (ref 0.0–0.5)
Eosinophils Relative: 2 %
HCT: 31.9 % — ABNORMAL LOW (ref 39.0–52.0)
Hemoglobin: 10.3 g/dL — ABNORMAL LOW (ref 13.0–17.0)
Immature Granulocytes: 1 %
Lymphocytes Relative: 18 %
Lymphs Abs: 2 10*3/uL (ref 0.7–4.0)
MCH: 32.3 pg (ref 26.0–34.0)
MCHC: 32.3 g/dL (ref 30.0–36.0)
MCV: 100 fL (ref 80.0–100.0)
Monocytes Absolute: 1 10*3/uL (ref 0.1–1.0)
Monocytes Relative: 9 %
Neutro Abs: 7.8 10*3/uL — ABNORMAL HIGH (ref 1.7–7.7)
Neutrophils Relative %: 70 %
Platelets: 366 10*3/uL (ref 150–400)
RBC: 3.19 MIL/uL — ABNORMAL LOW (ref 4.22–5.81)
RDW: 13.8 % (ref 11.5–15.5)
WBC: 11.3 10*3/uL — ABNORMAL HIGH (ref 4.0–10.5)
nRBC: 0 % (ref 0.0–0.2)

## 2019-06-11 LAB — TSH: TSH: 6.423 u[IU]/mL — ABNORMAL HIGH (ref 0.350–4.500)

## 2019-06-11 LAB — MRSA PCR SCREENING: MRSA by PCR: NEGATIVE

## 2019-06-11 LAB — GLUCOSE, CAPILLARY
Glucose-Capillary: 122 mg/dL — ABNORMAL HIGH (ref 70–99)
Glucose-Capillary: 116 mg/dL — ABNORMAL HIGH (ref 70–99)
Glucose-Capillary: 131 mg/dL — ABNORMAL HIGH (ref 70–99)

## 2019-06-11 LAB — SEDIMENTATION RATE: Sed Rate: 75 mm/hr — ABNORMAL HIGH (ref 0–16)

## 2019-06-11 LAB — PREALBUMIN: Prealbumin: 18.6 mg/dL (ref 18–38)

## 2019-06-11 LAB — C-REACTIVE PROTEIN: CRP: 5 mg/dL — ABNORMAL HIGH (ref <1.0)

## 2019-06-11 LAB — HIV ANTIBODY (ROUTINE TESTING W REFLEX): HIV Screen 4th Generation wRfx: NONREACTIVE

## 2019-06-11 MED ORDER — LORATADINE 10 MG PO TABS
10.0000 mg | ORAL_TABLET | Freq: Every day | ORAL | Status: DC
  Administered 2019-06-11 – 2019-06-16 (×6): 10 mg via ORAL
  Filled 2019-06-11 (×6): qty 1

## 2019-06-11 MED ORDER — ACETAMINOPHEN 650 MG RE SUPP: 650.0000 mg | Freq: Four times a day (QID) | RECTAL | Status: DC | PRN

## 2019-06-11 MED ORDER — OCUVITE-LUTEIN PO CAPS
1.0000 | ORAL_CAPSULE | Freq: Every day | ORAL | Status: DC
  Filled 2019-06-11: qty 1

## 2019-06-11 MED ORDER — PROSIGHT PO TABS
1.0000 | ORAL_TABLET | Freq: Every day | ORAL | Status: DC
  Administered 2019-06-11 – 2019-06-16 (×6): 1 via ORAL
  Filled 2019-06-11 (×6): qty 1

## 2019-06-11 MED ORDER — ENOXAPARIN SODIUM 40 MG/0.4ML ~~LOC~~ SOLN
40.0000 mg | SUBCUTANEOUS | Status: DC
  Administered 2019-06-11 – 2019-06-16 (×6): 40 mg via SUBCUTANEOUS
  Filled 2019-06-11 (×6): qty 0.4

## 2019-06-11 MED ORDER — ENSURE ENLIVE PO LIQD
237.0000 mL | Freq: Two times a day (BID) | ORAL | Status: DC
  Administered 2019-06-11 – 2019-06-14 (×5): 237 mL via ORAL

## 2019-06-11 MED ORDER — POLYETHYLENE GLYCOL 3350 17 G PO PACK: 17.0000 g | PACK | Freq: Every day | ORAL | Status: DC | PRN

## 2019-06-11 MED ORDER — FENTANYL CITRATE (PF) 100 MCG/2ML IJ SOLN
100.0000 ug | Freq: Once | INTRAMUSCULAR | Status: AC
  Administered 2019-06-11: 100 ug via INTRAVENOUS
  Filled 2019-06-11: qty 2

## 2019-06-11 MED ORDER — LEVOTHYROXINE SODIUM 50 MCG PO TABS
50.0000 ug | ORAL_TABLET | Freq: Every day | ORAL | Status: DC
  Administered 2019-06-11 – 2019-06-16 (×6): 50 ug via ORAL
  Filled 2019-06-11 (×6): qty 1

## 2019-06-11 MED ORDER — PIPERACILLIN-TAZOBACTAM 3.375 G IVPB 30 MIN
3.3750 g | Freq: Once | INTRAVENOUS | Status: AC
  Administered 2019-06-11: 3.375 g via INTRAVENOUS
  Filled 2019-06-11: qty 50

## 2019-06-11 MED ORDER — METOPROLOL SUCCINATE ER 25 MG PO TB24
25.0000 mg | ORAL_TABLET | Freq: Every day | ORAL | Status: DC
  Administered 2019-06-11 – 2019-06-16 (×5): 25 mg via ORAL
  Filled 2019-06-11 (×6): qty 1

## 2019-06-11 MED ORDER — RANOLAZINE ER 500 MG PO TB12
1000.0000 mg | ORAL_TABLET | Freq: Two times a day (BID) | ORAL | Status: DC
  Administered 2019-06-11 – 2019-06-15 (×10): 1000 mg via ORAL
  Filled 2019-06-11 (×11): qty 2

## 2019-06-11 MED ORDER — METRONIDAZOLE IN NACL 5-0.79 MG/ML-% IV SOLN
500.0000 mg | Freq: Three times a day (TID) | INTRAVENOUS | Status: DC
  Administered 2019-06-11 – 2019-06-14 (×8): 500 mg via INTRAVENOUS
  Filled 2019-06-11 (×8): qty 100

## 2019-06-11 MED ORDER — GADOBUTROL 1 MMOL/ML IV SOLN
7.0000 mL | Freq: Once | INTRAVENOUS | Status: AC | PRN
  Administered 2019-06-11: 7 mL via INTRAVENOUS

## 2019-06-11 MED ORDER — ONDANSETRON HCL 4 MG/2ML IJ SOLN: 4.0000 mg | Freq: Four times a day (QID) | INTRAMUSCULAR | Status: DC | PRN

## 2019-06-11 MED ORDER — INSULIN ASPART 100 UNIT/ML ~~LOC~~ SOLN
0.0000 [IU] | Freq: Three times a day (TID) | SUBCUTANEOUS | Status: DC
  Administered 2019-06-11 – 2019-06-13 (×3): 1 [IU] via SUBCUTANEOUS
  Administered 2019-06-13 – 2019-06-14 (×2): 2 [IU] via SUBCUTANEOUS
  Administered 2019-06-15: 1 [IU] via SUBCUTANEOUS

## 2019-06-11 MED ORDER — JUVEN PO PACK
1.0000 | PACK | Freq: Two times a day (BID) | ORAL | Status: DC
  Filled 2019-06-11 (×9): qty 1

## 2019-06-11 MED ORDER — ROSUVASTATIN CALCIUM 5 MG PO TABS
10.0000 mg | ORAL_TABLET | Freq: Every day | ORAL | Status: DC
  Administered 2019-06-11 – 2019-06-16 (×6): 10 mg via ORAL
  Filled 2019-06-11 (×6): qty 2

## 2019-06-11 MED ORDER — ASPIRIN EC 81 MG PO TBEC
81.0000 mg | DELAYED_RELEASE_TABLET | Freq: Every day | ORAL | Status: DC
  Administered 2019-06-11 – 2019-06-16 (×6): 81 mg via ORAL
  Filled 2019-06-11 (×6): qty 1

## 2019-06-11 MED ORDER — SODIUM CHLORIDE 0.9 % IV SOLN: 2.0000 g | INTRAVENOUS | Status: DC

## 2019-06-11 MED ORDER — CLOPIDOGREL BISULFATE 75 MG PO TABS
75.0000 mg | ORAL_TABLET | Freq: Every day | ORAL | Status: DC
  Administered 2019-06-11 – 2019-06-16 (×6): 75 mg via ORAL
  Filled 2019-06-11 (×6): qty 1

## 2019-06-11 MED ORDER — INSULIN ASPART 100 UNIT/ML ~~LOC~~ SOLN: 0.0000 [IU] | Freq: Every day | SUBCUTANEOUS | Status: DC

## 2019-06-11 MED ORDER — NITROGLYCERIN 0.4 MG SL SUBL: 0.4000 mg | SUBLINGUAL_TABLET | SUBLINGUAL | Status: DC | PRN

## 2019-06-11 MED ORDER — SODIUM CHLORIDE 0.9 % IV SOLN
2.0000 g | INTRAVENOUS | Status: DC
  Administered 2019-06-11 – 2019-06-15 (×5): 2 g via INTRAVENOUS
  Filled 2019-06-11 (×5): qty 20

## 2019-06-11 MED ORDER — VANCOMYCIN HCL IN DEXTROSE 1-5 GM/200ML-% IV SOLN
1000.0000 mg | Freq: Once | INTRAVENOUS | Status: AC
  Administered 2019-06-11: 1000 mg via INTRAVENOUS
  Filled 2019-06-11: qty 200

## 2019-06-11 MED ORDER — BISACODYL 10 MG RE SUPP: 10.0000 mg | Freq: Every day | RECTAL | Status: DC | PRN

## 2019-06-11 MED ORDER — ACETAMINOPHEN 325 MG PO TABS
650.0000 mg | ORAL_TABLET | Freq: Four times a day (QID) | ORAL | Status: DC | PRN
  Administered 2019-06-12 – 2019-06-15 (×3): 650 mg via ORAL
  Filled 2019-06-11 (×3): qty 2

## 2019-06-11 MED ORDER — METRONIDAZOLE IN NACL 5-0.79 MG/ML-% IV SOLN: 500.0000 mg | Freq: Three times a day (TID) | INTRAVENOUS | Status: DC

## 2019-06-11 MED ORDER — ONDANSETRON HCL 4 MG PO TABS: 4.0000 mg | ORAL_TABLET | Freq: Four times a day (QID) | ORAL | Status: DC | PRN

## 2019-06-11 MED ORDER — SERTRALINE HCL 100 MG PO TABS
250.0000 mg | ORAL_TABLET | Freq: Every day | ORAL | Status: DC
  Administered 2019-06-11 – 2019-06-16 (×6): 250 mg via ORAL
  Filled 2019-06-11 (×6): qty 3

## 2019-06-11 MED ORDER — OXYCODONE-ACETAMINOPHEN 5-325 MG PO TABS
1.0000 | ORAL_TABLET | Freq: Four times a day (QID) | ORAL | Status: DC | PRN
  Administered 2019-06-11 – 2019-06-16 (×13): 1 via ORAL
  Filled 2019-06-11 (×14): qty 1

## 2019-06-11 NOTE — H&P (Signed)
History and Physical    Jason [name] [id number] DOB: 12-17-1950 DOA: 06/11/2019  PCP: Patient, [name]   Patient coming from: [facility]  Chief Complaint: Fall  HPI: Jason [name] is a 68 y.o. male with medical history significant for but not limited too depression and anxiety, bilateral carotid artery disease, history of basal cell cancer, COPD, history of several MIs in CAD with CABG and history of unstable angina, diabetes mellitus type 2, GERD, hypertension, hyperlipidemia, right bundle branch block and history of tobacco abuse and substance abuse as well as peripheral vascular disease who recently underwent amputations of his right first 2 toes on 12/ 03/2019 with revascularization procedure.  He was discharged to a rehab facility and was rehabbing but fell on Wednesday and complained of some right hip pain.  Because of worsening foot pain he presented to the emergency room for further evaluation recommendations and there was noted to having some discharge and crusting at the suture line with redness and warmth of the right foot onto the right shin area.  An x-ray was obtained and case was discussed with vascular surgery who recommended admission to [facility] for vascular surgery evaluation.  Patient denies any chest pain, lightheadedness or dizziness and no shortness breath at this time.  No other concerns or complaints at this time and he was given IV antibiotics with vancomycin and Zosyn in the ED.  Try hospice was called admit this patient for cellulitis of the right foot with concern for osteomyelitis in a patient with recent right toe amputations.   ED Course: Patient had basic blood work done in the ED with an i-STAT and a CBC and had a x-ray of his foot.  Vascular surgery was consulted.  Of note he tested negative for the coronavirus as well as the influenza.   Review of Systems: As per HPI otherwise all other systems reviewed and negative.   Past  Medical History:  Diagnosis Date  . Anxiety    off xanax  and paxil since 3/13  . Arthritis   . Bilateral carotid artery disease ([place])    s/p R ICA stent 03/28/2015 with distal protection. Known chronically occluded L ICA. Carotid stent complicated by hypotension and acute stroke in [place]  . Cancer ([place])    tumor basal cell rem from lft arm  . Cataract   . COPD (chronic obstructive pulmonary disease) ([place])   . Coronary artery disease    a. s/p CABG in 2011 with LIMA-LAD, SVG-RI/OM, and SVG-PDA b. occluded SVG-PDA by cath in 2015 c. 10/2016: NSTEMI with cath showing thrombus along the distal graft to insertion of SVG-OM2 with DES placed.   . CVA (cerebral infarction)    occured on 10/10 several days after R ICA carotid stenting  . Depression   . Diabetes mellitus   . GERD (gastroesophageal reflux disease)   . Heart attack ([place])   . High cholesterol   . Hypertension    type 2 NIDDM  . Myocardial infarct ([facility])    x4 last 10 yrs  . Pneumonia    hx  . 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 Smoker   . Substance abuse [facility])    Past Surgical History:  Procedure Laterality Date  . ABDOMINAL AORTOGRAM W/LOWER EXTREMITY Bilateral 05/31/2019   Procedure: ABDOMINAL AORTOGRAM W/LOWER EXTREMITY;  Surgeon: Waynetta Sandy, MD;  Location: [place] [facility];  Service: Cardiovascular;  Laterality: Bilateral;  .  AMPUTATION TOE Right 06/02/2019   Procedure: Amputation Right Great Toe and Second Toe;  Surgeon: Waynetta Sandy, MD;  Location: [place];  Service: Vascular;  Laterality: Right;  . BACK SURGERY  Jan 2014, Dec 2011   Dr Vertell Limber  . CARDIAC CATHETERIZATION  4/12   Medical Rx  . CORONARY ANGIOGRAM  01/17/14   med rx  . CORONARY ANGIOGRAM  4/13   med Rx  . CORONARY ANGIOGRAM  1/15   Med Rx  . CORONARY ANGIOPLASTY  Jan 2004   RCA  . CORONARY ARTERY BYPASS GRAFT  07/24/2009   L-LAD, SVG-RI/OM, SVG-PDA  . CORONARY STENT  INTERVENTION N/A 10/23/2016   Procedure: Coronary Stent Intervention;  Surgeon: Peter M Martinique, MD;  Location: [place] [facility];  Service: Cardiovascular;  Laterality: N/A;  . ENDARTERECTOMY FEMORAL Right 06/02/2019   Procedure: Endarterectomy External Iliac  Femoral Artery and Profunda;  Surgeon: Waynetta Sandy, MD;  Location: [place];  Service: Vascular;  Laterality: Right;  . EYE SURGERY     cat bil  . FEMORAL ARTERY - FEMORAL ARTERY BYPASS GRAFT Right 2004   femoral enarterectomy  . FEMORAL-POPLITEAL BYPASS GRAFT Right 06/02/2019   Procedure: RIGHT LEG BYPASS GRAFT FEMORAL-POPLITEAL ARTERY using Gore Propaten Vascular Graft Removable Ring;  Surgeon: Waynetta Sandy, MD;  Location: [place];  Service: Vascular;  Laterality: Right;  . LEFT HEART CATH AND CORS/GRAFTS ANGIOGRAPHY N/A 10/23/2016   Procedure: Left Heart Cath and Cors/Grafts Angiography;  Surgeon: Peter M Martinique, MD;  Location: [place] [facility];  Service: Cardiovascular;  Laterality: N/A;  . LEFT HEART CATHETERIZATION WITH CORONARY ANGIOGRAM N/A 10/03/2011   Procedure: LEFT HEART CATHETERIZATION WITH CORONARY ANGIOGRAM;  Surgeon: Pixie Casino, MD;  Location[facility] C[facility] LAB;  Service: Cardiovascular;  Laterality: N/A;  . LEFT HEART CATHETERIZATION WITH CORONARY ANGIOGRAM N/A 07/08/2013   Procedure: LEFT HEART CATHETERIZATION WITH CORONARY ANGIOGRAM;  Surgeon: Blane Ohara, MD;  Location: [facility] CATH LAB;  Service: Cardiovascular;  Laterality: N/A;  . LEFT HEART CATHETERIZATION WITH CORONARY/GRAFT ANGIOGRAM N/A 01/17/2014   Procedure: LEFT HEART CATHETERIZATION WITH Beatrix Fetters;  Surgeon: Troy Sine, MD;  Location: [facility] CATH LAB;  Service: Cardiovascular;  Laterality: N/A;  . PATCH ANGIOPLASTY Right 06/02/2019   Procedure: Patch Angioplasty using Hemashield Platinum Finesse Patch of the External Iliac Femoral Artery and Profunda;  Surgeon: Waynetta Sandy, MD;  Location: [place];  Service: Vascular;   Laterality: Right;  . PERIPHERAL VASCULAR CATHETERIZATION N/A 03/28/2015   Procedure: Carotid PTA/Stent Intervention;  Surgeon: Lorretta Harp, MD;  Location: [place] [facility];  Service: Cardiovascular;  Laterality: N/A;  . US EXTREMITY*L*     lft arm tumor removed    SOCIAL HISTORY  reports that he has been smoking cigarettes. He has a 48.00 pack-year smoking history. He has never used smokeless tobacco. He reports current alcohol use. He reports that he does not use drugs.  Allergies  Allergen Reactions  . Coreg [Carvedilol] Nausea And Vomiting and Other (See Comments)    Per patient made him dizzy and light sensitive  . Sulfa Antibiotics Nausea And Vomiting and Other (See Comments)    Also headaches    Family History  Problem Relation Age of Onset  . Arthritis Mother   . Heart disease Father    Prior to Admission medications   Medication Sig Start Date End Date Taking? Authorizing Provider  aspirin 81 MG tablet Take 1 tablet (81 mg total) by mouth daily. 10/26/16  Lyda Jester M, PA-C  clopidogrel (PLAVIX) 75 MG tablet Take 1 tablet (75 mg total) by mouth daily. 03/02/19   Kroeger, Lorelee Cover., PA-C  levothyroxine (SYNTHROID, LEVOTHROID) 50 MCG tablet TAKE 1 TABLET BY MOUTH EVERY DAY 08/31/16   Tereasa Coop, PA-C  loratadine (CLARITIN) 10 MG tablet Take 10 mg by mouth daily.    [provider]  metoprolol succinate (TOPROL-XL) 25 MG 24 hr tablet Take 1 tablet (25 mg total) by mouth daily. 06/08/19 07/08/19  Darliss Cheney, MD  nitroGLYCERIN (NITROSTAT) 0.4 MG SL tablet PLACE 1 TABLET (0.4 MG TOTAL) UNDER THE TONGUE EVERY 5 (FIVE) MINUTES AS NEEDED FOR CHEST PAIN. 03/02/19   Kroeger, Lorelee Cover., PA-C  oxyCODONE-acetaminophen (PERCOCET/ROXICET) 5-325 MG tablet Take 1 tablet by mouth every 6 (six) hours as needed for severe pain. 06/07/19 06/11/19  [provider]  ranolazine (RANEXA) 1000 MG SR tablet TAKE 1 TABLET BY MOUTH 2 TIMES DAILY. NEED OV. 03/02/19    Kroeger, Daleen Snook M., PA-C  rosuvastatin (CRESTOR) 10 MG tablet Take 10 mg by mouth daily.    [provider]  sertraline (ZOLOFT) 100 MG tablet Take 2 and 1/2 tablets by mouth daily 12/22/18   Forrest Moron, MD  sitaGLIPtin-metformin (JANUMET) 50-500 MG tablet Take 1 tablet by mouth 2 (two) times daily with a meal. 06/07/19 07/07/19  Darliss Cheney, MD   Physical Exam: Vitals:   06/11/19 0717 06/11/19 0718 06/11/19 0719 06/11/19 0720  BP:   98/60   Pulse: 65 67 64 67  Resp:   17   Temp:      TempSrc:      SpO2: 97% 97% 97% 98%  Weight:      Height:       Constitutional: Thin chronically ill appearing Caucasian male in NAD and appears calm  Eyes: Lids and conjunctivae normal, sclerae anicteric  ENMT: External Ears, Nose appear normal. Grossly normal hearing. Poor Dentition  Neck: Appears normal, supple, no cervical masses, normal ROM, no appreciable thyromegaly Respiratory: Diminished to auscultation bilaterally, no wheezing, rales, rhonchi or crackles. Normal respiratory effort and patient is not tachypenic. No accessory muscle use. Unlabored breathing  Cardiovascular: RRR, no murmurs / rubs / gallops. S1 and S2 auscultated. 1+ extremity edema in Right  Abdomen: Soft, non-tender, Distended slightly. Bowel sounds positive x4.  GU: Deferred. Musculoskeletal: Has Right first 2 Toe Amputations with sutures; Diminished motion in Right Leg due to pain  Skin: Warm and erythema on Right foot extended up to his distal shin. No induration; Warm and dry.  Neurologic: CN 2-12 grossly intact with no focal deficits. Romberg sign and Cerebellar reflexes not assessed.  Psychiatric: Normal judgment and insight. Alert and oriented x 3. Anxious mood and appropriate affect.   Labs on Admission: I have personally reviewed following labs and imaging studies  CBC: Recent Labs  Lab 06/05/19 0233 06/06/19 0311 06/07/19 0316 06/11/19 0230 06/11/19 0243  WBC 11.3* 11.9* 11.0* 11.3*  --    NEUTROABS 7.8* 9.2* 7.3 7.8*  --   HGB 8.3* 8.6* 9.1* 10.3* 10.5*  HCT 25.9* 26.2* 27.6* 31.9* 31.0*  MCV 99.2 97.4 96.8 100.0  --   PLT 242 281 327 366  --    Basic Metabolic Panel: Recent Labs  Lab 06/05/19 0233 06/11/19 0243  NA 135 131*  K 4.3 4.9  CL 106 95*  CO2 20*  --   GLUCOSE 122* 95  BUN 12 14  CREATININE 0.97 1.20  CALCIUM 8.6*  --  GFR: Estimated Creatinine Clearance: 60.6 mL/min (by C-G formula based on SCr of 1.2 mg/dL). Liver Function Tests: No results for input(s): AST, ALT, ALKPHOS, BILITOT, PROT, ALBUMIN in the last 168 hours. No results for input(s): LIPASE, AMYLASE in the last 168 hours. No results for input(s): AMMONIA in the last 168 hours. Coagulation Profile: No results for input(s): INR, PROTIME in the last 168 hours. Cardiac Enzymes: No results for input(s): CKTOTAL, CKMB, CKMBINDEX, TROPONINI in the last 168 hours. BNP (last 3 results) No results for input(s): PROBNP in the last 8760 hours. HbA1C: No results for input(s): HGBA1C in the last 72 hours. CBG: Recent Labs  Lab 06/06/19 1106 06/06/19 1624 06/06/19 2153 06/07/19 0610 06/07/19 1119  GLUCAP 82 125* 110* 73 163*   Lipid Profile: No results for input(s): CHOL, HDL, LDLCALC, TRIG, CHOLHDL, LDLDIRECT in the last 72 hours. Thyroid Function Tests: No results for input(s): TSH, T4TOTAL, FREET4, T3FREE, THYROIDAB in the last 72 hours. Anemia Panel: No results for input(s): VITAMINB12, FOLATE, FERRITIN, TIBC, IRON, RETICCTPCT in the last 72 hours. Urine analysis:    Component Value Date/Time   COLORURINE YELLOW 07/08/2013 0226   APPEARANCEUR CLEAR 07/08/2013 0226   LABSPEC 1.006 07/08/2013 0226   PHURINE 6.0 07/08/2013 0226   GLUCOSEU NEGATIVE 07/08/2013 0226   HGBUR NEGATIVE 07/08/2013 0226   BILIRUBINUR NEGATIVE 07/08/2013 0226   KETONESUR NEGATIVE 07/08/2013 0226   PROTEINUR NEGATIVE 07/08/2013 0226   UROBILINOGEN 0.2 07/08/2013 0226   NITRITE NEGATIVE 07/08/2013 0226    LEUKOCYTESUR NEGATIVE 07/08/2013 0226   Sepsis Labs: !!!!!!!!!!!!!!!!!!!!!!!!!!!!!!!!!!!!!!!!!!!! @LABRCNTIP (procalcitonin:4,lacticidven:4) ) Recent Results (from the past 240 hour(s))  Surgical pcr screen     Status: Abnormal   Collection Time: 06/02/19  4:38 AM   Specimen: Nasal Mucosa; Nasal Swab  Result Value Ref Range Status   MRSA, PCR NEGATIVE NEGATIVE Final   Staphylococcus aureus POSITIVE (A) NEGATIVE Final    Comment: (NOTE) The Xpert SA Assay (FDA approved for NASAL specimens in patients 8 years of age and older), is one component of a comprehensive surveillance program. It is not intended to diagnose infection nor to guide or monitor treatment. Performed at [facility], [place] [address], [place], [place] [address]   SARS CORONAVIRUS 2 (TAT 6-24 HRS) Nasopharyngeal Nasopharyngeal Swab     Status: None   Collection Time: 06/06/19  1:14 PM   Specimen: Nasopharyngeal Swab  Result Value Ref Range Status   SARS Coronavirus 2 NEGATIVE NEGATIVE Final    Comment: (NOTE) SARS-CoV-2 target nucleic acids are NOT DETECTED. The SARS-CoV-2 RNA is generally detectable in upper and lower respiratory specimens during the acute phase of infection. Negative results do not preclude SARS-CoV-2 infection, do not rule out co-infections with other pathogens, and should not be used as the sole basis for treatment or other patient management decisions. Negative results must be combined with clinical observations, patient history, and epidemiological information. The expected result is Negative. Fact Sheet for Patients: SugarRoll.be Fact Sheet for Healthcare Providers: https://www.woods-mathews.com/ This test is not yet approved or cleared by the [place] FDA and  has been authorized for detection and/or diagnosis of SARS-CoV-2 by FDA under an Emergency Use Authorization (EUA). This EUA will remain  in effect (meaning this test can be used)  for the duration of the COVID-19 declaration under Section 56 4(b)(1) of the Act, 21 U.S.C. section 360bbb-3(b)(1), unless the authorization is terminated or revoked sooner. Performed at [facility], [place] [address], [place], [place] [address]  Respiratory Panel by RT PCR (Flu A&B, Covid) - Nasopharyngeal Swab     Status: None   Collection Time: 06/11/19  2:30 AM   Specimen: Nasopharyngeal Swab  Result Value Ref Range Status   SARS Coronavirus 2 by RT PCR NEGATIVE NEGATIVE Final    Comment: (NOTE) SARS-CoV-2 target nucleic acids are NOT DETECTED. The SARS-CoV-2 RNA is generally detectable in upper respiratoy specimens during the acute phase of infection. The lowest concentration of SARS-CoV-2 viral copies this assay can detect is 131 copies/mL. A negative result does not preclude SARS-Cov-2 infection and should not be used as the sole basis for treatment or other patient management decisions. A negative result may occur with  improper specimen collection/handling, submission of specimen other than nasopharyngeal swab, presence of viral mutation(s) within the areas targeted by this assay, and inadequate number of viral copies (<131 copies/mL). A negative result must be combined with clinical observations, patient history, and epidemiological information. The expected result is Negative. Fact Sheet for Patients:  PinkCheek.be Fact Sheet for Healthcare Providers:  GravelBags.it This test is not yet ap proved or cleared by the [place] FDA and  has been authorized for detection and/or diagnosis of SARS-CoV-2 by FDA under an Emergency Use Authorization (EUA). This EUA will remain  in effect (meaning this test can be used) for the duration of the COVID-19 declaration under Section 564(b)(1) of the Act, 21 U.S.C. section 360bbb-3(b)(1), unless the authorization is terminated or revoked sooner.    Influenza A by PCR  NEGATIVE NEGATIVE Final   Influenza B by PCR NEGATIVE NEGATIVE Final    Comment: (NOTE) The Xpert Xpress SARS-CoV-2/FLU/RSV assay is intended as an aid in  the diagnosis of influenza from Nasopharyngeal swab specimens and  should not be used as a sole basis for treatment. Nasal washings and  aspirates are unacceptable for Xpert Xpress SARS-CoV-2/FLU/RSV  testing. Fact Sheet for Patients: PinkCheek.be Fact Sheet for Healthcare Providers: GravelBags.it This test is not yet approved or cleared by the [place] FDA and  has been authorized for detection and/or diagnosis of SARS-CoV-2 by  FDA under an Emergency Use Authorization (EUA). This EUA will remain  in effect (meaning this test can be used) for the duration of the  Covid-19 declaration under Section 564(b)(1) of the Act, 21  U.S.C. section 360bbb-3(b)(1), unless the authorization is  terminated or revoked. Performed at [facility], [place] [address], [place], [place] [address]     Radiological Exams on Admission: DG Foot Complete Right  Result Date: 06/11/2019 CLINICAL DATA:  Right foot pain. Surgery to remove 2 toes from right foot 06/02/2019. EXAM: RIGHT FOOT COMPLETE - 3+ VIEW COMPARISON:  Preoperative radiograph 05/27/2019 FINDINGS: Transmetatarsal resection of the first and second rays. Developing heterotopic calcification at the resection margin. Slight irregularity of the surgical margin of the second metatarsal. The remainder the foot is intact. No acute fracture. Small plantar calcaneal spur. Remote distal fibular fracture. 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 Slight developing heterotopic ossification at the operative bed. Resection margin of the second ray is minimally irregular, this may be normal postoperative healing or early osteomyelitis. Consider further evaluation with MRI based on clinical concern.  Electronically Signed   By: Keith Rake M.D.   On: 06/11/2019 02:08   DG HIP UNILAT WITH PELVIS 2-3 VIEWS RIGHT  Result Date: 06/11/2019 CLINICAL DATA:  Fall yesterday.  Right hip pain. EXAM: DG HIP (WITH OR WITHOUT PELVIS) 2-3V RIGHT COMPARISON:  None.  FINDINGS: The cortical margins of the bony pelvis and right hip are intact. No fracture. Pubic symphysis and sacroiliac joints are congruent. Both femoral heads are well-seated in the respective acetabula. Bones are under mineralized. Surgical hardware in the lower lumbar spine is partially included. There is mild soft tissue edema lateral to the right hip. IMPRESSION: 1. No acute fracture of the pelvis or right hip. 2. Lateral soft tissue edema. Electronically Signed   By: Keith Rake M.D.   On: 06/11/2019 02:05   EKG: No EKG Done on Arrival to the ED. Will obtain one now.   Assessment/Plan Active Problems:   Cellulitis of right foot  Cellulitis of the Right Foot in the Area of his Ray Amputations with concern for Osteomyelitis in a Diabetic known with PVD -Admit to Inpatient Medical Telemetry at [facility] at the Request of Vascular Surgery -Vascular Surgery will consult as patient recently had 2 Toe Amputations done by Dr. Donzetta Matters on 06/02/2019 -X-Ray of the Foot showed "Post transmetatarsal amputation of the first and second ray. Slight developing heterotopic ossification at the operative bed. Resection margin of the second ray is minimally irregular, this may be normal postoperative healing or early osteomyelitis. Consider further evaluation with MRI based on clinical concern" -Start Cellulitic Focused Abx and R/o Osteomyelitis and patient was placed on IV Ceftriaxone and IV Metronidazole -De-escalate Abx as Warranted -Check MRI of the Foot -Multidisciplinary Care Approach and will get Diabetes Education Coordinator, TOC, and WOC involved -Appreciate Further Recommendations by Vascular Surgery -Check Blood Cx x2 -WBC Was  11.3 -Check Inflammatory Markers   Recent Fall -PT/OT to Evaluate and Treat   Normocytic Anemia -Patient's Hb/Hct went from 9.1/27.6 -> 10.3/31.9 -> 10.5/31.0 -Check Anemia Panel in the AM -Continue to Monitor for S/Sx of Bleeding; Currently no overt bleeding noted -Repeat CBC in AM   Hx of MI/Hx of Unstable Angina/Hx of CAD and CABG x4 -C/w Cardiac Medications with ASA 81 mg po daily, Clopidogrel 75 mg po Daily, Metoprolol Succinate 25 mg po daily, NTG 0.4 mg sL q56miprn CP, Ranolazine 1000 mg po BID, and Rosuvastatin 10 mg po Daily  Hypothyroidism -Check TSH -C/w Levothyroxine 50 mcg po Daily   HTN -C/w Metoprolol Succinate 25 mg po Daily   HLD -C/w Rosuvastatin 10 mg po Daily   Poorly Controlled Diabetes Mellitus Type 2 -Recent HbA1c was 8.5 -Hold Home po Medications (Sitagliptin-Metformin) and start on Sensitive Novolog SSI AC/HS -Continue to Monitor CBG's carefully and adjust Insulin as Necessary  Hx of Carotid Stenosis s/p Stent -C/w ASA, Clopidogrel, and Rosuvastatin as Above   Hyponatremia/Hypochloremia -Mild as Na+ was 131 and Chloride level was 95 -Continue to Monitor and Trend -Repeat CMP in AM   Hx of COPD -Currently not in Exacerbation -Do not see any Inhalers for him on the [facility]  Depression and Anxiety -C/w Sertraline  DVT prophylaxis: Enoxaparin 40 mg sq q24h Code Status: FULL CODE  Family Communication: No family present at bedside  Disposition Plan: [facility] to [facility] Evaluation  Consults called: Vascular Surgery  Admission status: Inpatient Telemetry   Severity of Illness: The appropriate patient status for this patient is INPATIENT. Inpatient status is judged to be reasonable and necessary in order to provide the required intensity of service to ensure the patient's safety. The patient's presenting symptoms, physical exam findings, and initial radiographic and laboratory data in the context of their chronic  comorbidities is felt to place them at high risk for further clinical deterioration.  Furthermore, it is not anticipated that the patient will be medically stable for discharge from the hospital within 2 midnights of admission. The following factors support the patient status of inpatient.   " The patient's presenting symptoms include Foot Pain and Fall. " The worrisome physical exam findings include Redness and Pain of Right Foot near Amputation sight " The initial radiographic and laboratory data are worrisome because of elevated WBC and Findings on X-Ray . " The chronic co-morbidities are listed as above  * I certify that at the point of admission it is my clinical judgment that the patient will require inpatient hospital care spanning beyond 2 midnights from the point of admission due to high intensity of service, high risk for further deterioration and high frequency of surveillance required.Kerney Elbe, D.O. [facility] PAGER is on [place]  If 7PM-7AM, please contact night-coverage [email or web address]  06/11/2019, 7:24 AM

## 2019-06-11 NOTE — ED Triage Notes (Addendum)
Per EMS, patient from [facility] and endorses having a fall yesterday. Patient had surgery a few days ago to remove 2 toes from his right foot. The PA from nursing home believes pt may have fractured his right hip. Pt denies LOC during fall. Patient takes blood thinners.

## 2019-06-11 NOTE — ED Notes (Signed)
Attempted report x1. 

## 2019-06-11 NOTE — Progress Notes (Signed)
Inpatient Diabetes Program Recommendations  AACE/ADA: New Consensus Statement on Inpatient Glycemic Control (2015)  Target Ranges:  Prepandial:   less than 140 mg/dL      Peak postprandial:   less than 180 mg/dL (1-2 hours)      Critically ill patients:  140 - 180 mg/dL   Results for [name], Jason [name] (MRN [id number]) as of 06/11/2019 09:10  Ref. Range 06/11/2019 02:43  Glucose Latest Ref Range: 70 - 99 mg/dL 95   Results for [name], [name] (MRN [id number]) as of 06/11/2019 09:10  Ref. Range 05/27/2019 18:37  Hemoglobin A1C Latest Ref Range: 4.8 - 5.6 % 8.5 (H)    Admit with: Fell at SNF/ Cellulitis R Foot  History: DM, COPD, CVA  Home DM Meds: Janumet 50/500 mg BID  Current Insulin Orders: None placed yet   Just discharged to [facility] 12/15 after admission for Cellulitis and amputation of 2 toes.    MD- Please consider adding orders for CBG checks and Novolog Sensitive Correction Scale/ SSI (0-9 units) TID AC + HS     --Will follow patient during hospitalization--  Wyn Quaker RN, MSN, CDE Diabetes Coordinator Inpatient Glycemic Control Team Team Pager: [phone] (8a-5p)

## 2019-06-11 NOTE — Progress Notes (Signed)
Subjective  - POD #9, status post right iliofemoral endarterectomy with patch angioplasty and right femoral to below-knee popliteal bypass graft with Gore-Tex.  He also had amputation of his right first and second toes by Dr. Donzetta Matters.  The patient was discharged several days ago to rehab but felt 3 days ago and had right hip pain which is the reason why he came to the emergency department.  He was noted to have some discharge around his amputation site with redness and warmth extending up to his shin.  He was admitted for antibiotics.   Physical Exam:  Suture line from amputation remains intact with dry eschar.  There is some erythema on the dorsum of his foot.  He has biphasic pedal Doppler signals.       Assessment/Plan:  POD #9  The patient does have mild cellulitis on the dorsum of his foot.  There is some component of a reactive inflammatory process from his amputation and revascularization.  I did not feel there was any underlying abscess.  At this time I would recommend IV antibiotics to see if this process improves.  Jason [name] 06/11/2019 11:48 AM --  Vitals:   06/11/19 0720 06/11/19 0910  BP:  (!) 110/58  Pulse: 67 66  Resp:    Temp:  98.4 F (36.9 C)  SpO2: 98% 98%    Intake/Output Summary (Last 24 hours) at 06/11/2019 1148 Last data filed at 06/11/2019 1100 Gross per 24 hour  Intake 240 ml  Output --  Net 240 ml     Laboratory CBC    Component Value Date/Time   WBC 11.3 (H) 06/11/2019 0230   HGB 10.5 (L) 06/11/2019 0243   HCT 31.0 (L) 06/11/2019 0243   PLT 366 06/11/2019 0230    BMET    Component Value Date/Time   NA 131 (L) 06/11/2019 0243   NA 131 (L) 09/30/2017 1629   K 4.9 06/11/2019 0243   CL 95 (L) 06/11/2019 0243   CO2 20 (L) 06/05/2019 0233   GLUCOSE 95 06/11/2019 0243   BUN 14 06/11/2019 0243   BUN 13 09/30/2017 1629   CREATININE 1.20 06/11/2019 0243   CREATININE 1.14 07/12/2015 1220   CALCIUM 8.6 (L) 06/05/2019 0233    GFRNONAA >60 06/05/2019 0233   GFRNONAA 69 07/12/2015 1220   GFRAA >60 06/05/2019 0233   GFRAA 79 07/12/2015 1220    COAG Lab Results  Component Value Date   INR 1.01 10/23/2016   INR 0.94 03/21/2015   INR 0.97 03/09/2015   No results found for: PTT  Antibiotics Anti-infectives (From admission, onward)   Start     Dose/Rate Route Frequency Ordered Stop   06/11/19 1715  metroNIDAZOLE (FLAGYL) IVPB 500 mg     500 mg 100 mL/hr over 60 Minutes Intravenous Every 8 hours 06/11/19 0925     06/11/19 1330  cefTRIAXone (ROCEPHIN) 2 g in sodium chloride 0.9 % 100 mL IVPB     2 g 200 mL/hr over 30 Minutes Intravenous Every 24 hours 06/11/19 0925     06/11/19 0907  cefTRIAXone (ROCEPHIN) 2 g in sodium chloride 0.9 % 100 mL IVPB  Status:  Discontinued     2 g 200 mL/hr over 30 Minutes Intravenous Every 24 hours 06/11/19 0907 06/11/19 0925   06/11/19 0907  metroNIDAZOLE (FLAGYL) IVPB 500 mg  Status:  Discontinued     500 mg 100 mL/hr over 60 Minutes Intravenous Every 8 hours 06/11/19 0907 06/11/19 0925   06/11/19  0300  vancomycin (VANCOCIN) IVPB 1000 mg/200 mL premix     1,000 mg 200 mL/hr over 60 Minutes Intravenous  Once 06/11/19 0256 06/11/19 0525   06/11/19 0300  piperacillin-tazobactam (ZOSYN) IVPB 3.375 g     3.375 g 100 mL/hr over 30 Minutes Intravenous  Once 06/11/19 0256 06/11/19 0559       V. Leia Alf, M.D., [facility] [place] Office: [phone] Pager:  [phone]

## 2019-06-11 NOTE — Plan of Care (Signed)

## 2019-06-11 NOTE — ED Notes (Signed)
Provided pt with urinal

## 2019-06-11 NOTE — Progress Notes (Signed)
Initial Nutrition Assessment  DOCUMENTATION CODES:   Not applicable  INTERVENTION:  -Ensure Enlive po BID, each supplement provides 350 kcal and 20 grams of protein  -1 packet Juven BID, each packet provides 95 calories, 2.5 grams of protein (collagen), and 9.8 grams of carbohydrate (3 grams sugar); also contains 7 grams of L-arginine and L-glutamine, 300 mg vitamin C, 15 mg vitamin E, 1.2 mcg vitamin B-12, 9.5 mg zinc, 200 mg calcium, and 1.5 g  Calcium Beta-hydroxy-Beta-methylbutyrate to support wound healing  -Ocuvite-lutein MVI po daily  NUTRITION DIAGNOSIS:   Increased nutrient needs related to wound healing(mild cellulitis of right foot s/p right firtst and second toe amputation) as evidenced by estimated needs.   GOAL:   Patient will meet greater than or equal to 90% of their needs   MONITOR:   PO intake, Supplement acceptance, Weight trends, I & O's, Skin, Labs  REASON FOR ASSESSMENT:   Consult Wound healing  ASSESSMENT:  RD working remotely.  68 year old male with medical history significant of depression and anxiety, bilateral CAD, h/o basal cell cancer, COPD, h/o multiple MI's with CABG, h/o unstable angina, T2DM, GERD, HTN, HLD, right bundle branch block, h/o substance abuse, PVD, recent amputation of right first 2 toes 12/10 with revascularization procedure. Patient discharged to rehab facility where he subsequently experienced a fall and presented to ED for evaluation of right hip pain and worsening foot pain. In ED, pt noted with some discharge and crusting at suture line with redness and warmth of right foot extending to right shin. XR obtained and discussed with vascular surgery who recommended admission for cellulitis of right foot with concern for osteomyelitis in pt with recent right toe amputations.  Patient is post op day 9 s/p right iliofemoral endarterectomy with patch angioplasty and right femoral to below-knee popliteal bypass graft and amputation of  right first and second toe.   Per vascular noted, pt has mild cellulitis on dorsum of right foot and some component of reactive inflammatory process from amputation and revascularization. Recommending IV antibiotics at this time.  RD consulted for wound healing  Unable to obtain nutrition history at this time, per chart pt is in MRI. Patient eating 100% x 1 recorded meal this admission. Will continue to monitor for po intake; CBGs and provide Ensure to aid with increased calorie/protein needs as well as Juven to support wound healing.   Noted mild pitting RLE edema and non-pitting LLE edema per review of RN flowsheet  Current wt 75.3 kg (165.7 lbs) Weight history reviewed; stable Medications reviewed and include: SS Novolog, Rocephin, Flagyl  Labs: CBGs 122 x 1 this admission, Na 131 (L)   NUTRITION - FOCUSED PHYSICAL EXAM: Unable to complete at this time, RD working remotely.    Diet Order:   Diet Order            Diet heart healthy/carb modified Room service appropriate? Yes; Fluid consistency: Thin  Diet effective now              EDUCATION NEEDS:   No education needs have been identified at this time  Skin:  Skin Assessment: Reviewed RN Assessment(incision;closed; right foot, right groin, right leg)  Last BM:  12/14  Height:   Ht Readings from Last 1 Encounters:  06/11/19 5\' 9"  (1.753 m)    Weight:   Wt Readings from Last 1 Encounters:  06/11/19 75.3 kg    Ideal Body Weight:  72.7 kg  BMI:  Body mass index is 24.[place]  kg/m.  Estimated Nutritional Needs:   Kcal:  2000-2200  Protein:  110-120  Fluid:  >/= 2 L/day   Lajuan Lines, RD, LDN Clinical Nutrition [place] Telephone [phone] After Hours/Weekend Pager: [phone]

## 2019-06-11 NOTE — ED Provider Notes (Signed)
[facility] DEPT Provider Note   CSN: [id number] Arrival date & time: 06/11/19  0017     History Chief Complaint  Patient presents with   Post-op Problem    Jason [name] is a 68 y.o. male.  The history is provided by the patient.  Fall This is a new problem. The current episode started yesterday. The problem occurs constantly. The problem has not changed since onset.Pertinent negatives include no chest pain, no abdominal pain, no headaches and no shortness of breath. Nothing aggravates the symptoms. Nothing relieves the symptoms. He has tried nothing for the symptoms. The treatment provided no relief.  Patient had an amputation of 2 toes on 12/10 with a revascularization procedure and has fallen at a nursing home.  He complains of R hip pain.  Of not there is discharge and crusting at the suture line and redness and warmth of the right foot and onto the right shin.  No f/c/r.       Past Medical History:  Diagnosis Date   Anxiety    off xanax  and paxil since 3/13   Arthritis    Bilateral carotid artery disease ([place])    s/p R ICA stent 03/28/2015 with distal protection. Known chronically occluded L ICA. Carotid stent complicated by hypotension and acute stroke in [place]   Cancer [facility])    tumor basal cell rem from lft arm   Cataract    COPD (chronic obstructive pulmonary disease) ([place])    Coronary artery disease    a. s/p CABG in 2011 with LIMA-LAD, SVG-RI/OM, and SVG-PDA b. occluded SVG-PDA by cath in 2015 c. 10/2016: NSTEMI with cath showing thrombus along the distal graft to insertion of SVG-OM2 with DES placed.    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)    Smoker    Substance abuse [facility])     Patient Active Problem List   Diagnosis Date Noted   Cellulitis 05/27/2019   Gangrene of toe ([place]) 05/27/2019   Status post lumbar spinal fusion 10/27/2017   Acute on chronic combined systolic and diastolic CHF (congestive heart failure) ([place]) 10/25/2016   NSTEMI (non-ST elevated myocardial infarction) ([place]) 10/22/2016   Hypothyroidism 08/17/2015   RBBB 05/11/2015   Hyponatremia 04/04/2015   Hypotension 04/04/2015   Hemispheric carotid artery syndrome    Carotid artery narrowing 03/28/2015   Carotid stenosis- moderate 2011, 95% 2016 s/p stent 02/21/2014   Unstable angina ([place]) 01/16/2014   Tobacco abuse 01/16/2014   Substance abuse in remission ([place]) 10/01/2012   Radiculitis 02/10/2012   CAD, CABG Feb 2011, cath x 4 since-medical Rx 10/03/2011   PVD, hx Rt femoral endarterectomy 2004 10/03/2011   DM (diabetes mellitus) ([place]) 10/02/2011   HTN (hypertension) 10/02/2011   Hyperlipidemia 10/02/2011    Past Surgical History:  Procedure Laterality Date   ABDOMINAL AORTOGRAM W/LOWER EXTREMITY Bilateral 05/31/2019   Procedure: ABDOMINAL AORTOGRAM W/LOWER EXTREMITY;  Surgeon: Waynetta Sandy, MD;  Location: [facility];  Service: Cardiovascular;  Laterality: Bilateral;   AMPUTATION TOE Right  06/02/2019   Procedure: Amputation Right Great Toe and Second Toe;  Surgeon: Waynetta Sandy, MD;  Location: [facility];  Service: Vascular;  Laterality: Right;   BACK SURGERY  Jan 2014, Dec 2011   Dr Vertell Limber   CARDIAC CATHETERIZATION  4/12   Medical Rx   CORONARY ANGIOGRAM  01/17/14   med rx   CORONARY ANGIOGRAM  4/13   med Rx   CORONARY ANGIOGRAM  1/15   Med Rx   CORONARY ANGIOPLASTY  Jan 2004   RCA   CORONARY ARTERY BYPASS GRAFT  07/24/2009   L-LAD, SVG-RI/OM, SVG-PDA   CORONARY STENT INTERVENTION N/A 10/23/2016    Procedure: Coronary Stent Intervention;  Surgeon: Peter M Martinique, MD;  Location: [facility];  Service: Cardiovascular;  Laterality: N/A;   ENDARTERECTOMY FEMORAL Right 06/02/2019   Procedure: Endarterectomy External Iliac  Femoral Artery and Profunda;  Surgeon: Waynetta Sandy, MD;  Location: [facility];  Service: Vascular;  Laterality: Right;   EYE SURGERY     cat bil   FEMORAL ARTERY - FEMORAL ARTERY BYPASS GRAFT Right 2004   femoral enarterectomy   FEMORAL-POPLITEAL BYPASS GRAFT Right 06/02/2019   Procedure: RIGHT LEG BYPASS GRAFT FEMORAL-POPLITEAL ARTERY using Gore Propaten Vascular Graft Removable Ring;  Surgeon: Waynetta Sandy, MD;  Location: [facility];  Service: Vascular;  Laterality: Right;   LEFT HEART CATH AND CORS/GRAFTS ANGIOGRAPHY N/A 10/23/2016   Procedure: Left Heart Cath and Cors/Grafts Angiography;  Surgeon: Peter M Martinique, MD;  Location: [facility];  Service: Cardiovascular;  Laterality: N/A;   LEFT HEART CATHETERIZATION WITH CORONARY ANGIOGRAM N/A 10/03/2011   Procedure: LEFT HEART CATHETERIZATION WITH CORONARY ANGIOGRAM;  Surgeon: Pixie Casino, MD;  Location[facility] C[facility] LAB;  Service: Cardiovascular;  Laterality: N/A;   LEFT HEART CATHETERIZATION WITH CORONARY ANGIOGRAM N/A 07/08/2013   Procedure: LEFT HEART CATHETERIZATION WITH CORONARY ANGIOGRAM;  Surgeon: Blane Ohara, MD;  Location: [facility] CATH LAB;  Service: Cardiovascular;  Laterality: N/A;   LEFT HEART CATHETERIZATION WITH CORONARY/GRAFT ANGIOGRAM N/A 01/17/2014   Procedure: LEFT HEART CATHETERIZATION WITH Beatrix Fetters;  Surgeon: Troy Sine, MD;  Location: [facility] LAB;  Service: Cardiovascular;  Laterality: N/A;   PATCH ANGIOPLASTY Right 06/02/2019   Procedure: Patch Angioplasty using Hemashield Platinum Finesse Patch of the External Iliac Femoral Artery and Profunda;  Surgeon: Waynetta Sandy, MD;  Location: [facility];  Service: Vascular;  Laterality: Right;    PERIPHERAL VASCULAR CATHETERIZATION N/A 03/28/2015   Procedure: Carotid PTA/Stent Intervention;  Surgeon: Lorretta Harp, MD;  Location: [facility];  Service: Cardiovascular;  Laterality: N/A;   US EXTREMITY*L*     lft arm tumor removed        Family History  Problem Relation Age of Onset   Arthritis Mother    Heart disease Father     Social History   Tobacco Use   Smoking status: Current Every Day Smoker    Packs/day: 1.00    Years: 48.00    Pack years: 48.00    Types: Cigarettes   Smokeless tobacco: Never Used  Substance Use Topics   Alcohol use: Yes    Alcohol/week: 0.0 standard drinks    Comment: socially   Drug use: No    Comment: hx of cocaine use    Home Medications Prior to Admission medications   Medication Sig Start Date End Date Taking? Authorizing Provider  aspirin 81 MG tablet Take 1 tablet (81 mg total) by mouth daily. 10/26/16  Lyda Jester M, PA-C  clopidogrel (PLAVIX) 75 MG tablet Take 1 tablet (75 mg total) by mouth daily. 03/02/19   Kroeger, Lorelee Cover., PA-C  levothyroxine (SYNTHROID, LEVOTHROID) 50 MCG tablet TAKE 1 TABLET BY MOUTH EVERY DAY 08/31/16   Tereasa Coop, PA-C  loratadine (CLARITIN) 10 MG tablet Take 10 mg by mouth daily.    [provider]  metoprolol succinate (TOPROL-XL) 25 MG 24 hr tablet Take 1 tablet (25 mg total) by mouth daily. 06/08/19 07/08/19  Darliss Cheney, MD  nitroGLYCERIN (NITROSTAT) 0.4 MG SL tablet PLACE 1 TABLET (0.4 MG TOTAL) UNDER THE TONGUE EVERY 5 (FIVE) MINUTES AS NEEDED FOR CHEST PAIN. 03/02/19   Kroeger, Lorelee Cover., PA-C  oxyCODONE-acetaminophen (PERCOCET/ROXICET) 5-325 MG tablet Take 1 tablet by mouth every 6 (six) hours as needed for severe pain. 06/07/19 06/11/19  [provider]  ranolazine (RANEXA) 1000 MG SR tablet TAKE 1 TABLET BY MOUTH 2 TIMES DAILY. NEED OV. 03/02/19   Kroeger, Daleen Snook M., PA-C  rosuvastatin (CRESTOR) 10 MG tablet Take 10 mg by mouth daily.    [provider]  sertraline (ZOLOFT) 100 MG tablet Take 2 and 1/2 tablets by mouth daily 12/22/18   Forrest Moron, MD  sitaGLIPtin-metformin (JANUMET) 50-500 MG tablet Take 1 tablet by mouth 2 (two) times daily with a meal. 06/07/19 07/07/19  Darliss Cheney, MD    Allergies    Coreg [carvedilol] and Sulfa antibiotics  Review of Systems   Review of Systems  Constitutional: Negative for fever.  HENT: Negative for congestion.   Eyes: Negative for visual disturbance.  Respiratory: Negative for cough and shortness of breath.   Cardiovascular: Negative for chest pain.  Gastrointestinal: Negative for abdominal pain.  Genitourinary: Negative for difficulty urinating.  Musculoskeletal: Positive for arthralgias and joint swelling.  Neurological: Negative for headaches.  Psychiatric/Behavioral: Negative for agitation.  All other systems reviewed and are negative.   Physical Exam Updated Vital Signs BP 116/60    Pulse 61    Temp (!) 97.5 F (36.4 C) (Oral)    Resp 18    Ht 5\' 9"  (1.753 m)    Wt 75.3 kg    SpO2 99%    BMI 24.51 kg/m   Physical Exam Vitals and nursing note reviewed.  Constitutional:      Appearance: Normal appearance. He is not diaphoretic.  HENT:     Head: Normocephalic and atraumatic.     Nose: Nose normal.  Eyes:     Conjunctiva/sclera: Conjunctivae normal.     Pupils: Pupils are equal, round, and reactive to light.  Cardiovascular:     Rate and Rhythm: Normal rate and regular rhythm.     Pulses: Normal pulses.     Heart sounds: Normal heart sounds.  Pulmonary:     Effort: Pulmonary effort is normal.     Breath sounds: Normal breath sounds.  Abdominal:     General: Abdomen is flat. Bowel sounds are normal.     Tenderness: There is no abdominal tenderness. There is no guarding or rebound.  Musculoskeletal:     Cervical back: Normal range of motion and neck supple.     Right hip: Normal.     Left hip: Normal.     Right ankle: Normal.     Right Achilles Tendon:  Normal.     Left ankle: Normal.     Left Achilles Tendon: Normal.       Legs:  Skin:    General: Skin is warm and  dry.     Capillary Refill: Capillary refill takes less than 2 seconds.  Neurological:     General: No focal deficit present.     Mental Status: He is alert and oriented to person, place, and time.     Deep Tendon Reflexes: Reflexes normal.  Psychiatric:        Mood and Affect: Mood normal.        Behavior: Behavior normal.     ED Results / Procedures / Treatments   Labs (all labs ordered are listed, but only abnormal results are displayed) Results for orders placed or performed during the hospital encounter of 06/11/19  Respiratory Panel by RT PCR (Flu A&B, Covid) - Nasopharyngeal Swab   Specimen: Nasopharyngeal Swab  Result Value Ref Range   SARS Coronavirus 2 by RT PCR NEGATIVE NEGATIVE   Influenza A by PCR NEGATIVE NEGATIVE   Influenza B by PCR NEGATIVE NEGATIVE  CBC with Differential  Result Value Ref Range   WBC 11.3 (H) 4.0 - 10.5 K/uL   RBC 3.19 (L) 4.22 - 5.81 MIL/uL   Hemoglobin 10.3 (L) 13.0 - 17.0 g/dL   HCT 31.9 (L) 39.0 - 52.0 %   MCV 100.0 80.0 - 100.0 fL   MCH 32.3 26.0 - 34.0 pg   MCHC 32.3 30.0 - 36.0 g/dL   RDW 13.8 11.5 - 15.5 %   Platelets 366 150 - 400 K/uL   nRBC 0.0 0.0 - 0.2 %   Neutrophils Relative % 70 %   Neutro Abs 7.8 (H) 1.7 - 7.7 K/uL   Lymphocytes Relative 18 %   Lymphs Abs 2.0 0.7 - 4.0 K/uL   Monocytes Relative 9 %   Monocytes Absolute 1.0 0.1 - 1.0 K/uL   Eosinophils Relative 2 %   Eosinophils Absolute 0.3 0.0 - 0.5 K/uL   Basophils Relative 0 %   Basophils Absolute 0.1 0.0 - 0.1 K/uL   Immature Granulocytes 1 %   Abs Immature Granulocytes 0.10 (H) 0.00 - 0.07 K/uL  I-stat chem 8, ED (not at [facility] or [facility])  Result Value Ref Range   Sodium 131 (L) 135 - 145 mmol/L   Potassium 4.9 3.5 - 5.1 mmol/L   Chloride 95 (L) 98 - 111 mmol/L   BUN 14 8 - 23 mg/dL   Creatinine, Ser 1.20 0.61 - 1.24 mg/dL   Glucose, Bld 95 70 -  99 mg/dL   Calcium, Ion 1.20 1.15 - 1.40 mmol/L   TCO2 26 22 - 32 mmol/L   Hemoglobin 10.5 (L) 13.0 - 17.0 g/dL   HCT 31.0 (L) 39.0 - 52.0 %   DG Chest 1 View  Result Date: 05/27/2019 CLINICAL DATA:  Cellulitis. EXAM: CHEST  1 VIEW COMPARISON:  August 01, 2017. FINDINGS: The heart size and mediastinal contours are within normal limits. Both lungs are clear. Status post coronary bypass graft. The visualized skeletal structures are unremarkable. IMPRESSION: No active disease. Electronically Signed   By: Marijo Conception M.D.   On: 05/27/2019 11:34   PERIPHERAL VASCULAR CATHETERIZATION  Result Date: 05/31/2019 Patient name: MIHRAN LEIBA MRN: [id number] DOB: August 25, 1950 Sex: male 05/31/2019 Pre-operative Diagnosis: Critical right lower extremity ischemia with great toe ulceration Post-operative diagnosis:  Same Surgeon:  Erlene Quan C. Donzetta Matters, MD Procedure Performed: 1.  Ultrasound-guided cannulation left common femoral artery 2.  Ultrasound-guided cannulation right common femoral artery 3.  Aortogram with bilateral lower extremity runoff 4.  Moderate sedation with fentanyl and Versed for Indications: 68 year old male here with great  toe ulceration on the right.  He has severely depressed ABIs bilaterally.  Has had previous intervention on the right common femoral artery after cardiac catheterization.  Has possibly had greater saphenous vein harvested on the right as well. Findings: I cannot access from the left side because a dissection plane.  From the right side I was able to access.  The aorta and iliac segments are heavily diseased but there does appear to be nonflow-limiting stenosis to the both common femoral arteries.  On the right side which is the site of issue he possibly has a bypass to the profunda the SFA is flush occluded.  He reconstitutes above-knee popliteal.  Appears to have runoff via the anterior tibial and peroneal arteries.  On the left side he has a very diseased SFA.  Appears to occlude  his tibioperoneal trunk and has runoff to the ankle via peroneal artery. We will consider patient for right common femoral to popliteal artery bypass.  He will be vein mapped for bilateral greater saphenous veins.  Procedure:  The patient was identified in the holding area and taken to room 8.  The patient was then placed supine on the table and prepped and draped in the usual sterile fashion.  A time out was called.  Ultrasound was used to evaluate the left common femoral artery.  This was heavily diseased.  The cannulated 1 healthy area with micropuncture needle followed by wire and sheath.  Images saved the permanent record.  The wire would not pass I did use fluoroscopy to get it to pass further.  I placed a micropuncture sheath.  I then used a stiff angled Glidewire.  Unfortunately was in a subintimal plane.  I placed a 5 [place] sheath 10 disease.  Catheter to reenter but could not.  I then used ultrasound to identify the common femoral artery on the right.  This also appeared to be heavily diseased there was scar tissue was possibly a bypass in place.  I cannulated this with direct ultrasound visualization images saved the permanent record.  I did get a Bentson wire to pass.  I then performed retrograde angiography with hand-injection to confirm I was intraluminal in the aorta.  I then placed a Omni catheter to the level of L1 performed aortogram followed bilateral lower extremity runoff with the above findings.  Patient will be considered for right common femoral to popliteal artery bypass.  We will vein map him.  He will likely need toe amputation as well.  Catheter was removed over wire.  Sheath will be pulled in postoperative holding.  He tolerated procedure well without immediate complication. Contrast: 100cc Brandon C. Donzetta Matters, MD [facility] Office: [phone] Pager: [phone]   DG Foot Complete Right  Result Date: 06/11/2019 CLINICAL DATA:  Right foot pain.  Surgery to remove 2 toes from right foot 06/02/2019. EXAM: RIGHT FOOT COMPLETE - 3+ VIEW COMPARISON:  Preoperative radiograph 05/27/2019 FINDINGS: Transmetatarsal resection of the first and second rays. Developing heterotopic calcification at the resection margin. Slight irregularity of the surgical margin of the second metatarsal. The remainder the foot is intact. No acute fracture. Small plantar calcaneal spur. Remote distal fibular fracture. 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 Slight developing heterotopic ossification at the operative bed. Resection margin of the second ray is minimally irregular, this may be normal postoperative healing or early osteomyelitis. Consider further evaluation with MRI based on clinical concern. Electronically Signed   By: Aurther Loft.D.  On: 06/11/2019 02:08   DG Foot Complete Right  Result Date: 05/27/2019 CLINICAL DATA:  Cellulitis.  Right foot swelling. EXAM: RIGHT FOOT COMPLETE - 3+ VIEW COMPARISON:  None. FINDINGS: There is no evidence of fracture or dislocation. There is no evidence of arthropathy or other focal bone abnormality. No lytic destruction is seen to suggest osteomyelitis. Soft tissues are unremarkable. IMPRESSION: Negative. Electronically Signed   By: Marijo Conception M.D.   On: 05/27/2019 11:36   VAS [place] ABI WITH/WO TBI  Result Date: 05/30/2019 LOWER EXTREMITY DOPPLER STUDY Indications: Ulceration, gangrene, and peripheral artery disease. High Risk Factors: Hypertension, hyperlipidemia, Diabetes, current smoker,                    coronary artery disease.  Vascular Interventions: Right ICA stent 03/28/15, CABG 2011. Comparison Study: No prior study on file for comparison Performing Technologist: Sharion Dove RVS  Examination Guidelines: A complete evaluation includes at minimum, Doppler waveform signals and systolic blood pressure reading at the level of bilateral brachial, anterior tibial, and posterior tibial  arteries, when vessel segments are accessible. Bilateral testing is considered an integral part of a complete examination. Photoelectric Plethysmograph (PPG) waveforms and toe systolic pressure readings are included as required and additional duplex testing as needed. Limited examinations for reoccurring indications may be performed as noted.  ABI Findings: +---------+------------------+-----+-------------------+------------------+  Right     Rt Pressure (mmHg) Index Waveform            Comment             +---------+------------------+-----+-------------------+------------------+  Brachial  135                      triphasic                               +---------+------------------+-----+-------------------+------------------+  PTA       54                 0.40  dampened monophasic                     +---------+------------------+-----+-------------------+------------------+  DP        40                 0.30  monophasic                              +---------+------------------+-----+-------------------+------------------+  Great Toe                                              bandage/ulceration  +---------+------------------+-----+-------------------+------------------+ +---------+------------------+-----+-------------------+-------+  Left      Lt Pressure (mmHg) Index Waveform            Comment  +---------+------------------+-----+-------------------+-------+  Brachial  129                      triphasic                    +---------+------------------+-----+-------------------+-------+  PTA       38                 0.28  dampened monophasic          +---------+------------------+-----+-------------------+-------+  DP        33                 0.24  dampened monophasic          +---------+------------------+-----+-------------------+-------+  Great Toe 0                  0.00                      absent   +---------+------------------+-----+-------------------+-------+  +-------+-----------+-----------+------------+------------+  ABI/TBI Today's ABI Today's TBI Previous ABI Previous TBI  +-------+-----------+-----------+------------+------------+  Right   0.4         wound                                  +-------+-----------+-----------+------------+------------+  Left    0.2         absent                                 +-------+-----------+-----------+------------+------------+  Summary: Right: Resting right ankle-brachial index indicates severe right lower extremity arterial disease. Left: Resting left ankle-brachial index indicates critical left limb ischemia. The left toe-brachial index is abnormal.  *See table(s) above for measurements and observations.  Electronically signed by Servando Snare MD on 05/30/2019 at 5:01:14 PM.    Final    VAS [place] LOWER EXTREMITY SAPHENOUS VEIN MAPPING  Result Date: 06/01/2019 LOWER EXTREMITY VEIN MAPPING Indications:       ulceration Other Indications: Occluded right lower extremity bypass graft.  Comparison Study: No priors. Performing Technologist: Oda Cogan RDMS, RVT  Examination Guidelines: A complete evaluation includes B-mode imaging, spectral Doppler, color Doppler, and power Doppler as needed of all accessible portions of each vessel. Bilateral testing is considered an integral part of a complete examination. Limited examinations for reoccurring indications may be performed as noted. +--------------+----------------+-------------------+--------------+-----------+   RT Diameter     RT Findings            GSV          LT Diameter   LT Findings        (cm)                                                (cm)                   +--------------+----------------+-------------------+--------------+-----------+       0.51                         Saphenofemoral         0.58                                                         Junction                                    +--------------+----------------+-------------------+--------------+-----------+       0.37  Proximal thigh         0.59                   +--------------+----------------+-------------------+--------------+-----------+                  not visualized       Mid thigh           0.37                                    and harvested                                                   +--------------+----------------+-------------------+--------------+-----------+                  not visualized     Distal thigh          0.43                                    and harvested                                                   +--------------+----------------+-------------------+--------------+-----------+                  not visualized         Knee              0.29                   +--------------+----------------+-------------------+--------------+-----------+       0.26         branching          Prox calf           0.24       branching   +--------------+----------------+-------------------+--------------+-----------+       0.21         branching          Mid calf            0.26                   +--------------+----------------+-------------------+--------------+-----------+       0.24                           Distal calf          0.20                   +--------------+----------------+-------------------+--------------+-----------+       0.27                              Ankle             0.23                   +--------------+----------------+-------------------+--------------+-----------+ Diagnosing physician: Curt Jews MD Electronically signed by Curt Jews MD on 06/01/2019 at 8:44:51 PM.    Final  DG HIP UNILAT WITH PELVIS 2-3 VIEWS RIGHT  Result Date: 06/11/2019 CLINICAL DATA:  Fall yesterday.  Right hip pain. EXAM: DG HIP (WITH OR WITHOUT PELVIS) 2-3V RIGHT COMPARISON:  None. FINDINGS: The cortical margins of the bony pelvis and right hip are intact. No fracture. Pubic symphysis  and sacroiliac joints are congruent. Both femoral heads are well-seated in the respective acetabula. Bones are under mineralized. Surgical hardware in the lower lumbar spine is partially included. There is mild soft tissue edema lateral to the right hip. IMPRESSION: 1. No acute fracture of the pelvis or right hip. 2. Lateral soft tissue edema. Electronically Signed   By: Keith Rake M.D.   On: 06/11/2019 02:05   VAS [place] LOWER EXTREMITY VENOUS (DVT)  Result Date: 06/04/2019  Lower Venous Study Indications: Pain, and Status post right femoral -popliteal bypass graft using the GSV.  Comparison Study: No priors. Performing Technologist: Oda Cogan RDMS, RVT  Examination Guidelines: A complete evaluation includes B-mode imaging, spectral Doppler, color Doppler, and power Doppler as needed of all accessible portions of each vessel. Bilateral testing is considered an integral part of a complete examination. Limited examinations for reoccurring indications may be performed as noted.  +---------+---------------+---------+-----------+----------+--------------+  RIGHT     Compressibility Phasicity Spontaneity Properties Thrombus Aging  +---------+---------------+---------+-----------+----------+--------------+  CFV       Full            Yes       Yes                                    +---------+---------------+---------+-----------+----------+--------------+  SFJ       Full                                                             +---------+---------------+---------+-----------+----------+--------------+  FV Prox   Full                                                             +---------+---------------+---------+-----------+----------+--------------+  FV Mid    Full                                                             +---------+---------------+---------+-----------+----------+--------------+  FV Distal Full                                                              +---------+---------------+---------+-----------+----------+--------------+  PFV       Full                                                             +---------+---------------+---------+-----------+----------+--------------+  POP       Full            Yes       Yes                                    +---------+---------------+---------+-----------+----------+--------------+  PTV       Full                                                             +---------+---------------+---------+-----------+----------+--------------+  PERO      Full                                                             +---------+---------------+---------+-----------+----------+--------------+   +----+---------------+---------+-----------+----------+--------------+  LEFT Compressibility Phasicity Spontaneity Properties Thrombus Aging  +----+---------------+---------+-----------+----------+--------------+  CFV  Full            Yes       Yes                                    +----+---------------+---------+-----------+----------+--------------+  SFJ  Full                                                             +----+---------------+---------+-----------+----------+--------------+     Summary: Right: There is no evidence of deep vein thrombosis in the lower extremity. No cystic structure found in the popliteal fossa. Left: No evidence of common femoral vein obstruction.  *See table(s) above for measurements and observations. Electronically signed by Servando Snare MD on 06/04/2019 at 10:20:16 AM.    Final     EKG None  Radiology DG Foot Complete Right  Result Date: 06/11/2019 CLINICAL DATA:  Right foot pain. Surgery to remove 2 toes from right foot 06/02/2019. EXAM: RIGHT FOOT COMPLETE - 3+ VIEW COMPARISON:  Preoperative radiograph 05/27/2019 FINDINGS: Transmetatarsal resection of the first and second rays. Developing heterotopic calcification at the resection margin. Slight irregularity of the surgical margin of the second  metatarsal. The remainder the foot is intact. No acute fracture. Small plantar calcaneal spur. Remote distal fibular fracture. 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 Slight developing heterotopic ossification at the operative bed. Resection margin of the second ray is minimally irregular, this may be normal postoperative healing or early osteomyelitis. Consider further evaluation with MRI based on clinical concern. Electronically Signed   By: Keith Rake M.D.   On: 06/11/2019 02:08   DG HIP UNILAT WITH PELVIS 2-3 VIEWS RIGHT  Result Date: 06/11/2019 CLINICAL DATA:  Fall yesterday.  Right hip pain. EXAM: DG HIP (WITH OR WITHOUT PELVIS) 2-3V RIGHT COMPARISON:  None. FINDINGS: The cortical margins of the bony pelvis and right hip are intact. No fracture. Pubic symphysis and sacroiliac joints are congruent. Both  femoral heads are well-seated in the respective acetabula. Bones are under mineralized. Surgical hardware in the lower lumbar spine is partially included. There is mild soft tissue edema lateral to the right hip. IMPRESSION: 1. No acute fracture of the pelvis or right hip. 2. Lateral soft tissue edema. Electronically Signed   By: Keith Rake M.D.   On: 06/11/2019 02:05    Procedures Procedures (including critical care time)  Medications Ordered in ED Medications  piperacillin-tazobactam (ZOSYN) IVPB 3.375 g (has no administration in time range)  vancomycin (VANCOCIN) IVPB 1000 mg/200 mL premix (1,000 mg Intravenous New Bag/Given 06/11/19 0402)   NPO  ED Course  I have reviewed the triage vital signs and the nursing notes.  Pertinent labs & imaging results that were available during my care of the patient were reviewed by me and considered in my medical decision making (see chart for details).     case d/w Dr. Trula Slade please admit to medicine at [facility], vascular will consult   Case d/w hospitalist who will admit the patient  Final Clinical  Impression(s) / ED Diagnoses Final diagnoses:  Cellulitis, unspecified cellulitis site  Osteomyelitis of right foot, unspecified type [facility])    covid is negative admit to hospitalist at [facility], Aireanna Luellen, MD 06/11/19 [id number]

## 2019-06-11 NOTE — ED Notes (Signed)
Pt. I-stat Chem 8 results sodium 3.1 and hemoglobin 10.5. Palumbo,MD., and RN, Lanelle Bal made aware.

## 2019-06-11 NOTE — ED Notes (Signed)
ED TO INPATIENT HANDOFF REPORT  Name/Age/Gender Jason [name] 68 y.o. male  Code Status Code Status History    Date Active Date Inactive Code Status Order ID Comments User Context   05/27/2019 1813 06/07/2019 1611 Partial Code [id number]  Debbe Odea, MD Inpatient   05/27/2019 1229 05/27/2019 1813 Full Code [id number]  Georgette Shell, MD ED   10/22/2016 2146 10/23/2016 1243 Full Code [id number]  Burnell Blanks, MD Inpatient   03/28/2015 1007 04/04/2015 2103 Full Code [id number]  Lorretta Harp, MD Inpatient   01/17/2014 1937 01/18/2014 1414 Full Code [id number]  [place] Sine, MD Inpatient   01/16/2014 2028 01/17/2014 1933 Full Code [id number]  Burnell Blanks, MD Inpatient   07/08/2013 0201 07/09/2013 1638 Full Code [id number]  Orson Eva, MD Inpatient   Advance Care Planning Activity    Questions for Most Recent Historical Code Status (Order [id number])    Question Answer Comment   In the event of cardiac or respiratory ARREST: Initiate Code Blue, Call Rapid Response Yes    In the event of cardiac or respiratory ARREST: Perform CPR Yes    In the event of cardiac or respiratory ARREST: Perform Intubation/Mechanical Ventilation No    In the event of cardiac or respiratory ARREST: Use NIPPV/BiPAp only if indicated Yes    In the event of cardiac or respiratory ARREST: Administer ACLS medications if indicated Yes    In the event of cardiac or respiratory ARREST: Perform Defibrillation or Cardioversion if indicated Yes       Home/SNF/Other Rehab  Chief Complaint Cellulitis of right foot [L03.115]  Level of Care/Admitting Diagnosis ED Disposition    ED Disposition Condition [place]: [place] [[id number]]  Level of Care: Med-Surg [16]  Covid Evaluation: Confirmed COVID Negative  Diagnosis: Cellulitis of right foot [id number]  Admitting Physician: Edmonia Lynch [id number]  Attending Physician: Bunnie Pion Z [id number]   Estimated length of stay: 3 - 4 days  Certification:: I certify this patient will need inpatient services for at least 2 midnights       Medical History Past Medical History:  Diagnosis Date  . Anxiety    off xanax  and paxil since 3/13  . Arthritis   . Bilateral carotid artery disease ([place])    s/p R ICA stent 03/28/2015 with distal protection. Known chronically occluded L ICA. Carotid stent complicated by hypotension and acute stroke in [place]  . Cancer ([place])    tumor basal cell rem from lft arm  . Cataract   . COPD (chronic obstructive pulmonary disease) ([place])   . Coronary artery disease    a. s/p CABG in 2011 with LIMA-LAD, SVG-RI/OM, and SVG-PDA b. occluded SVG-PDA by cath in 2015 c. 10/2016: NSTEMI with cath showing thrombus along the distal graft to insertion of SVG-OM2 with DES placed.   . CVA (cerebral infarction)    occured on 10/10 several days after R ICA carotid stenting  . Depression   . Diabetes mellitus   . GERD (gastroesophageal reflux disease)   . Heart attack ([place])   . High cholesterol   . Hypertension    type 2 NIDDM  . Myocardial infarct (HCC)    x4 last 10 yrs  . Pneumonia    hx  . 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 Smoker   . Substance abuse (H[facility])     Allergies Allergies  Allergen Reactions  . Coreg [  Carvedilol] Nausea And Vomiting and Other (See Comments)    Per patient made him dizzy and light sensitive  . Sulfa Antibiotics Nausea And Vomiting and Other (See Comments)    Also headaches     IV Location/Drains/Wounds Patient Lines/Drains/Airways Status   Active Line/Drains/Airways    Name:   Placement date:   Placement time:   Site:   Days:   Peripheral IV 06/02/19 Left Forearm   06/02/19    1220    Forearm   9   Peripheral IV 06/11/19 Left Forearm   06/11/19    0228    Forearm   less than 1   Closed System Drain Posterior Back Accordion (Hemovac) 10 Fr.   07/16/12    1603    Back   2521    Incision 10/03/11 Groin Left   10/03/11    --     2808   Incision 07/16/12 Back Bilateral   07/16/12    1541     2521   Incision (Closed) 06/02/19 Groin Right   06/02/19    1355     9   Incision (Closed) 06/02/19 Foot Right   06/02/19    1355     9   Incision (Closed) 06/02/19 Leg Right   06/02/19    1355     9   Wound / Incision (Open or Dehisced) 05/30/19 Toe (Comment  which one) Right gangrene   05/30/19    --    Toe (Comment  which one)   12          Labs/Imaging Results for orders placed or performed during the hospital encounter of 06/11/19 (from the past 48 hour(s))  CBC with Differential     Status: Abnormal   Collection Time: 06/11/19  2:30 AM  Result Value Ref Range   WBC 11.3 (H) 4.0 - 10.5 K/uL   RBC 3.19 (L) 4.22 - 5.81 MIL/uL   Hemoglobin 10.3 (L) 13.0 - 17.0 g/dL   HCT 31.9 (L) 39.0 - 52.0 %   MCV 100.0 80.0 - 100.0 fL   MCH 32.3 26.0 - 34.0 pg   MCHC 32.3 30.0 - 36.0 g/dL   RDW 13.8 11.5 - 15.5 %   Platelets 366 150 - 400 K/uL   nRBC 0.0 0.0 - 0.2 %   Neutrophils Relative % 70 %   Neutro Abs 7.8 (H) 1.7 - 7.7 K/uL   Lymphocytes Relative 18 %   Lymphs Abs 2.0 0.7 - 4.0 K/uL   Monocytes Relative 9 %   Monocytes Absolute 1.0 0.1 - 1.0 K/uL   Eosinophils Relative 2 %   Eosinophils Absolute 0.3 0.0 - 0.5 K/uL   Basophils Relative 0 %   Basophils Absolute 0.1 0.0 - 0.1 K/uL   Immature Granulocytes 1 %   Abs Immature Granulocytes 0.10 (H) 0.00 - 0.07 K/uL    Comment: Performed at [facility], [place] [address] [place], [place] [address]  Respiratory Panel by RT PCR (Flu A&B, Covid) - Nasopharyngeal Swab     Status: None   Collection Time: 06/11/19  2:30 AM   Specimen: Nasopharyngeal Swab  Result Value Ref Range   SARS Coronavirus 2 by RT PCR NEGATIVE NEGATIVE    Comment: (NOTE) SARS-CoV-2 target nucleic acids are NOT DETECTED. The SARS-CoV-2 RNA is generally detectable in upper respiratoy specimens during the acute phase of infection. The  lowest concentration of SARS-CoV-2 viral copies this assay can detect is 131 copies/mL. A negative result  does not preclude SARS-Cov-2 infection and should not be used as the sole basis for treatment or other patient management decisions. A negative result may occur with  improper specimen collection/handling, submission of specimen other than nasopharyngeal swab, presence of viral mutation(s) within the areas targeted by this assay, and inadequate number of viral copies (<131 copies/mL). A negative result must be combined with clinical observations, patient history, and epidemiological information. The expected result is Negative. Fact Sheet for Patients:  PinkCheek.be Fact Sheet for Healthcare Providers:  GravelBags.it This test is not yet ap proved or cleared by the [place] FDA and  has been authorized for detection and/or diagnosis of SARS-CoV-2 by FDA under an Emergency Use Authorization (EUA). This EUA will remain  in effect (meaning this test can be used) for the duration of the COVID-19 declaration under Section 564(b)(1) of the Act, 21 U.S.C. section 360bbb-3(b)(1), unless the authorization is terminated or revoked sooner.    Influenza A by PCR NEGATIVE NEGATIVE   Influenza B by PCR NEGATIVE NEGATIVE    Comment: (NOTE) The Xpert Xpress SARS-CoV-2/FLU/RSV assay is intended as an aid in  the diagnosis of influenza from Nasopharyngeal swab specimens and  should not be used as a sole basis for treatment. Nasal washings and  aspirates are unacceptable for Xpert Xpress SARS-CoV-2/FLU/RSV  testing. Fact Sheet for Patients: PinkCheek.be Fact Sheet for Healthcare Providers: GravelBags.it This test is not yet approved or cleared by the [place] FDA and  has been authorized for detection and/or diagnosis of SARS-CoV-2 by  FDA under an Emergency Use  Authorization (EUA). This EUA will remain  in effect (meaning this test can be used) for the duration of the  Covid-19 declaration under Section 564(b)(1) of the Act, 21  U.S.C. section 360bbb-3(b)(1), unless the authorization is  terminated or revoked. Performed at [facility], [place] [address], [place], [place] [address]   I-stat chem 8, ED (not at [facility] or [facility])     Status: Abnormal   Collection Time: 06/11/19  2:43 AM  Result Value Ref Range   Sodium 131 (L) 135 - 145 mmol/L   Potassium 4.9 3.5 - 5.1 mmol/L   Chloride 95 (L) 98 - 111 mmol/L   BUN 14 8 - 23 mg/dL   Creatinine, Ser 1.20 0.61 - 1.24 mg/dL   Glucose, Bld 95 70 - 99 mg/dL   Calcium, Ion 1.20 1.15 - 1.40 mmol/L   TCO2 26 22 - 32 mmol/L   Hemoglobin 10.5 (L) 13.0 - 17.0 g/dL   HCT 31.0 (L) 39.0 - 52.0 %   DG Foot Complete Right  Result Date: 06/11/2019 CLINICAL DATA:  Right foot pain. Surgery to remove 2 toes from right foot 06/02/2019. EXAM: RIGHT FOOT COMPLETE - 3+ VIEW COMPARISON:  Preoperative radiograph 05/27/2019 FINDINGS: Transmetatarsal resection of the first and second rays. Developing heterotopic calcification at the resection margin. Slight irregularity of the surgical margin of the second metatarsal. The remainder the foot is intact. No acute fracture. Small plantar calcaneal spur. Remote distal fibular fracture. 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 Slight developing heterotopic ossification at the operative bed. Resection margin of the second ray is minimally irregular, this may be normal postoperative healing or early osteomyelitis. Consider further evaluation with MRI based on clinical concern. Electronically Signed   By: Keith Rake M.D.   On: 06/11/2019 02:08   DG HIP UNILAT WITH PELVIS 2-3 VIEWS RIGHT  Result Date: 06/11/2019 CLINICAL DATA:  Fall yesterday.  Right hip pain. EXAM: DG HIP (WITH OR WITHOUT PELVIS) 2-3V RIGHT COMPARISON:  None.  FINDINGS: The cortical margins of the bony pelvis and right hip are intact. No fracture. Pubic symphysis and sacroiliac joints are congruent. Both femoral heads are well-seated in the respective acetabula. Bones are under mineralized. Surgical hardware in the lower lumbar spine is partially included. There is mild soft tissue edema lateral to the right hip. IMPRESSION: 1. No acute fracture of the pelvis or right hip. 2. Lateral soft tissue edema. Electronically Signed   By: Keith Rake M.D.   On: 06/11/2019 02:05    Pending Labs Unresulted Labs (From admission, onward)    Start     Ordered   06/11/19 0256  Blood culture (routine x 2)  BLOOD CULTURE X 2,   STAT    Question Answer Comment  Patient immune status Normal   Release to patient Immediate      06/11/19 0256          Vitals/Pain Today's Vitals   06/11/19 0200 06/11/19 0232 06/11/19 0400 06/11/19 0500  BP: (!) 126/56 (!) 114/46 116/60 120/66  Pulse: 69 67 61 68  Resp:  17 18 19   Temp:      TempSrc:      SpO2: 99% 99% 99% 94%  Weight:      Height:      PainSc:        Isolation Precautions No active isolations  Medications Medications  vancomycin (VANCOCIN) IVPB 1000 mg/200 mL premix (0 mg Intravenous Stopped 06/11/19 0525)  piperacillin-tazobactam (ZOSYN) IVPB 3.375 g (0 g Intravenous Stopped 06/11/19 0559)  fentaNYL (SUBLIMAZE) injection 100 mcg (100 mcg Intravenous Given 06/11/19 0557)    Mobility walks with device

## 2019-06-12 DIAGNOSIS — Z720 Tobacco use: Secondary | ICD-10-CM

## 2019-06-12 LAB — COMPREHENSIVE METABOLIC PANEL
ALT: 15 U/L (ref 0–44)
AST: 20 U/L (ref 15–41)
Albumin: 3.2 g/dL — ABNORMAL LOW (ref 3.5–5.0)
Alkaline Phosphatase: 76 U/L (ref 38–126)
Anion gap: 11 (ref 5–15)
BUN: 14 mg/dL (ref 8–23)
CO2: 24 mmol/L (ref 22–32)
Calcium: 9.4 mg/dL (ref 8.9–10.3)
Chloride: 95 mmol/L — ABNORMAL LOW (ref 98–111)
Creatinine, Ser: 1.18 mg/dL (ref 0.61–1.24)
GFR calc Af Amer: >60 mL/min (ref >60)
GFR calc non Af Amer: >60 mL/min (ref >60)
Glucose, Bld: 129 mg/dL — ABNORMAL HIGH (ref 70–99)
Potassium: 4.6 mmol/L (ref 3.5–5.1)
Sodium: 130 mmol/L — ABNORMAL LOW (ref 135–145)
Total Bilirubin: 0.5 mg/dL (ref 0.3–1.2)
Total Protein: 7 g/dL (ref 6.5–8.1)

## 2019-06-12 LAB — CBC WITH DIFFERENTIAL/PLATELET
Abs Immature Granulocytes: 0.06 10*3/uL (ref 0.00–0.07)
Basophils Absolute: 0.1 10*3/uL (ref 0.0–0.1)
Basophils Relative: 1 %
Eosinophils Absolute: 0.3 10*3/uL (ref 0.0–0.5)
Eosinophils Relative: 4 %
HCT: 30.3 % — ABNORMAL LOW (ref 39.0–52.0)
Hemoglobin: 10 g/dL — ABNORMAL LOW (ref 13.0–17.0)
Immature Granulocytes: 1 %
Lymphocytes Relative: 20 %
Lymphs Abs: 1.7 10*3/uL (ref 0.7–4.0)
MCH: 31.5 pg (ref 26.0–34.0)
MCHC: 33 g/dL (ref 30.0–36.0)
MCV: 95.6 fL (ref 80.0–100.0)
Monocytes Absolute: 0.8 10*3/uL (ref 0.1–1.0)
Monocytes Relative: 10 %
Neutro Abs: 5.4 10*3/uL (ref 1.7–7.7)
Neutrophils Relative %: 64 %
Platelets: 355 10*3/uL (ref 150–400)
RBC: 3.17 MIL/uL — ABNORMAL LOW (ref 4.22–5.81)
RDW: 13.2 % (ref 11.5–15.5)
WBC: 8.3 10*3/uL (ref 4.0–10.5)
nRBC: 0 % (ref 0.0–0.2)

## 2019-06-12 LAB — GLUCOSE, CAPILLARY
Glucose-Capillary: 97 mg/dL (ref 70–99)
Glucose-Capillary: 143 mg/dL — ABNORMAL HIGH (ref 70–99)
Glucose-Capillary: 116 mg/dL — ABNORMAL HIGH (ref 70–99)

## 2019-06-12 LAB — PHOSPHORUS: Phosphorus: 4.2 mg/dL (ref 2.5–4.6)

## 2019-06-12 LAB — MAGNESIUM: Magnesium: 1.7 mg/dL (ref 1.7–2.4)

## 2019-06-12 MED ORDER — NICOTINE 21 MG/24HR TD PT24
21.0000 mg | MEDICATED_PATCH | Freq: Every day | TRANSDERMAL | Status: DC
  Administered 2019-06-12 – 2019-06-16 (×5): 21 mg via TRANSDERMAL
  Filled 2019-06-12 (×5): qty 1

## 2019-06-12 NOTE — Plan of Care (Signed)
  Problem: Education: Goal: Knowledge of General Education information will improve Description: Including pain rating scale, medication(s)/side effects and non-pharmacologic comfort measures Outcome: Progressing   Problem: Health Behavior/Discharge Planning: Goal: Ability to manage health-related needs will improve Outcome: Progressing   Problem: Clinical Measurements: Goal: Will remain free from infection Outcome: Progressing Goal: Respiratory complications will improve Outcome: Progressing Goal: Cardiovascular complication will be avoided Outcome: Progressing   Problem: Activity: Goal: Risk for activity intolerance will decrease Outcome: Progressing   Problem: Coping: Goal: Level of anxiety will decrease Outcome: Progressing   

## 2019-06-12 NOTE — Progress Notes (Signed)
This encounter was created in error - please disregard.

## 2019-06-12 NOTE — Consult Note (Signed)
WOC Nurse Consult Note: Patient receiving care in [facility] 5N04.  Consult completed remotely after review of record.  Photo of area for consult unavailable at this time. Vascular surgery consulted. Reason for Consult: "lower extremity wound" Wound type: Right foot surgical amputation site with discharge and crusting, erythema. Measurement, Wound bed, Drainage (amount, consistency, odor),Periwound: To be provided by the bedside RN in the flowsheet section Per MRI scan from yesterday:  "Irregular fluid collections adjacent to the amputation sites are nonspecific, although could reflect abscesses, especially if there is drainage of purulent fluid. No well-defined sinus tract identified."  Dressing procedure/placement/frequency: Place Aquacel over right foot draining amputation site. Secure with kerlex. Change daily. Monitor the wound area(s) for worsening of condition such as: Signs/symptoms of infection,  Increase in size,  Development of or worsening of odor, Development of pain, or increased pain at the affected locations.  Notify the medical team if any of these develop.  Thank you for the consult.  [place] nurse will not follow at this time.  Please re-consult the [place] team if needed.  Val Riles, RN, MSN, CWOCN, CNS-BC, pager [phone]

## 2019-06-12 NOTE — Progress Notes (Signed)
PROGRESS NOTE    Jason [name]   [id number]  DOB: 1951-05-15  DOA: 06/11/2019 PCP: Patient, [name]   Brief Narrative:  Jason [name] is a 68 year old male with a past medical history of diabetes mellitus, coronary artery disease status post CABG and ongoing unstable angina, tobacco abuse, hypertension, hyperlipidemia, COPD, bilateral carotid artery disease, basal cell cancer and peripheral vascular disease status post right common femoral to femoral endarterectomy Dacron patch angioplasty, below the knee popliteal artery bypass and amputation of first and second toes 12/10. Patient presented to the ED from [facility] where he fell on 12/18.  He presented essentially for hip pain.  While in the ED, he was noted to have cellulitis of the foot and was therefore was recommended by vascular surgery to be admitted to the [facility].      Subjective: Pain and swelling in right foot and leg is to be improving.  He has no new complaints.    Assessment & Plan:   Principal Problem:   Cellulitis of right foot/peripheral vascular disease -Status post extensive vascular surgery and amputations on 12/10 as mentioned above -Mild cellulitis of the right foot currently being treated with ceftriaxone and Flagyl-see pictures below  Active Problems: Right hip pain after a fall -Pain is improving- will request a PT eval - he should be able to return to skilled nursing    DM (diabetes mellitus), uncontrolled with hyperglycemia -Last A1c on 12/4 was 8.5 -He is on Janumet as outpatient  -currently receiving a NovoLog sliding scale in the hospital    Unstable angina/coronary artery disease status post CABG -Continue aspirin, Plavix, Ranexa, metoprolol and Crestor    Tobacco abuse -Continues to smoke 1 pack a day of cigarettes-I have counseled him to discontinue     Hypothyroidism -Continue Synthroid     Time spent in minutes: 30 DVT prophylaxis:  Lovenox Code Status: Full code Family Communication:  Disposition Plan: Plan to return to skilled nursing when infection improved Consultants:   Vascular surgery Procedures:   None Antimicrobials:  Anti-infectives (From admission, onward)   Start     Dose/Rate Route Frequency Ordered Stop   06/11/19 1715  metroNIDAZOLE (FLAGYL) IVPB 500 mg     500 mg 100 mL/hr over 60 Minutes Intravenous Every 8 hours 06/11/19 0925     06/11/19 1330  cefTRIAXone (ROCEPHIN) 2 g in sodium chloride 0.9 % 100 mL IVPB     2 g 200 mL/hr over 30 Minutes Intravenous Every 24 hours 06/11/19 0925     06/11/19 0907  cefTRIAXone (ROCEPHIN) 2 g in sodium chloride 0.9 % 100 mL IVPB  Status:  Discontinued     2 g 200 mL/hr over 30 Minutes Intravenous Every 24 hours 06/11/19 0907 06/11/19 0925   06/11/19 0907  metroNIDAZOLE (FLAGYL) IVPB 500 mg  Status:  Discontinued     500 mg 100 mL/hr over 60 Minutes Intravenous Every 8 hours 06/11/19 0907 06/11/19 0925   06/11/19 0300  vancomycin (VANCOCIN) IVPB 1000 mg/200 mL premix     1,000 mg 200 mL/hr over 60 Minutes Intravenous  Once 06/11/19 0256 06/11/19 0525   06/11/19 0300  piperacillin-tazobactam (ZOSYN) IVPB 3.375 g     3.375 g 100 mL/hr over 30 Minutes Intravenous  Once 06/11/19 0256 06/11/19 0559       Objective: Vitals:   06/11/19 1556 06/12/19 0452 06/12/19 0500 06/12/19 0756  BP:  119/64  (!) 122/57  Pulse:  66  (!)  59  Resp:  16  16  Temp:  97.6 F (36.4 C)    TempSrc:  Oral    SpO2: 100% 99%  (!) 69%  Weight:   75 kg   Height:        Intake/Output Summary (Last 24 hours) at 06/12/2019 0948 Last data filed at 06/12/2019 0031 Gross per 24 hour  Intake 870.53 ml  Output 1300 ml  Net -429.47 ml   Filed Weights   06/11/19 0037 06/12/19 0500  Weight: 75.3 kg 75 kg    Examination: General exam: Appears comfortable  HEENT: PERRLA, oral mucosa moist, no sclera icterus or thrush Respiratory system: Clear to auscultation. Respiratory effort  normal. Cardiovascular system: S1 & S2 heard, RRR.   Gastrointestinal system: Abdomen soft, non-tender, nondistended. Normal bowel sounds. Central nervous system: Alert and oriented. No focal neurological deficits. Extremities: No cyanosis, clubbing or edema Skin: No rashes or ulcers      Psychiatry:  Mood & affect appropriate.     Data Reviewed: I have personally reviewed following labs and imaging studies  CBC: Recent Labs  Lab 06/06/19 0311 06/07/19 0316 06/11/19 0230 06/11/19 0243 06/12/19 0448  WBC 11.9* 11.0* 11.3*  --  8.3  NEUTROABS 9.2* 7.3 7.8*  --  5.4  HGB 8.6* 9.1* 10.3* 10.5* 10.0*  HCT 26.2* 27.6* 31.9* 31.0* 30.3*  MCV 97.4 96.8 100.0  --  95.6  PLT 281 327 366  --  [id number]   Basic Metabolic Panel: Recent Labs  Lab 06/11/19 0243 06/12/19 0448  NA 131* 130*  K 4.9 4.6  CL 95* 95*  CO2  --  24  GLUCOSE 95 129*  BUN 14 14  CREATININE 1.20 1.18  CALCIUM  --  9.4  MG  --  1.7  PHOS  --  4.2   GFR: Estimated Creatinine Clearance: 61.6 mL/min (by C-G formula based on SCr of 1.18 mg/dL). Liver Function Tests: Recent Labs  Lab 06/12/19 0448  AST 20  ALT 15  ALKPHOS 76  BILITOT 0.5  PROT 7.0  ALBUMIN 3.2*   No results for input(s): LIPASE, AMYLASE in the last 168 hours. No results for input(s): AMMONIA in the last 168 hours. Coagulation Profile: No results for input(s): INR, PROTIME in the last 168 hours. Cardiac Enzymes: No results for input(s): CKTOTAL, CKMB, CKMBINDEX, TROPONINI in the last 168 hours. BNP (last 3 results) No results for input(s): PROBNP in the last 8760 hours. HbA1C: No results for input(s): HGBA1C in the last 72 hours. CBG: Recent Labs  Lab 06/07/19 1119 06/11/19 1133 06/11/19 1711 06/11/19 2139 06/12/19 0659  GLUCAP 163* 122* 116* 131* 97   Lipid Profile: No results for input(s): CHOL, HDL, LDLCALC, TRIG, CHOLHDL, LDLDIRECT in the last 72 hours. Thyroid Function Tests: Recent Labs    06/11/19 0946  TSH  6.423*   Anemia Panel: No results for input(s): VITAMINB12, FOLATE, FERRITIN, TIBC, IRON, RETICCTPCT in the last 72 hours. Urine analysis:    Component Value Date/Time   COLORURINE YELLOW 07/08/2013 0226   APPEARANCEUR CLEAR 07/08/2013 0226   LABSPEC 1.006 07/08/2013 0226   PHURINE 6.0 07/08/2013 0226   GLUCOSEU NEGATIVE 07/08/2013 0226   HGBUR NEGATIVE 07/08/2013 0226   BILIRUBINUR NEGATIVE 07/08/2013 0226   KETONESUR NEGATIVE 07/08/2013 0226   PROTEINUR NEGATIVE 07/08/2013 0226   UROBILINOGEN 0.2 07/08/2013 0226   NITRITE NEGATIVE 07/08/2013 0226   LEUKOCYTESUR NEGATIVE 07/08/2013 0226   Sepsis Labs: @LABRCNTIP (procalcitonin:4,lacticidven:4) ) Recent Results (from the past 240 hour(s))  SARS  CORONAVIRUS 2 (TAT 6-24 HRS) Nasopharyngeal Nasopharyngeal Swab     Status: None   Collection Time: 06/06/19  1:14 PM   Specimen: Nasopharyngeal Swab  Result Value Ref Range Status   SARS Coronavirus 2 NEGATIVE NEGATIVE Final    Comment: (NOTE) SARS-CoV-2 target nucleic acids are NOT DETECTED. The SARS-CoV-2 RNA is generally detectable in upper and lower respiratory specimens during the acute phase of infection. Negative results do not preclude SARS-CoV-2 infection, do not rule out co-infections with other pathogens, and should not be used as the sole basis for treatment or other patient management decisions. Negative results must be combined with clinical observations, patient history, and epidemiological information. The expected result is Negative. Fact Sheet for Patients: SugarRoll.be Fact Sheet for Healthcare Providers: https://www.woods-mathews.com/ This test is not yet approved or cleared by the [place] FDA and  has been authorized for detection and/or diagnosis of SARS-CoV-2 by FDA under an Emergency Use Authorization (EUA). This EUA will remain  in effect (meaning this test can be used) for the duration of the COVID-19  declaration under Section 56 4(b)(1) of the Act, 21 U.S.C. section 360bbb-3(b)(1), unless the authorization is terminated or revoked sooner. Performed at [facility], [place] [address] [place], [place] [address]   Respiratory Panel by RT PCR (Flu A&B, Covid) - Nasopharyngeal Swab     Status: None   Collection Time: 06/11/19  2:30 AM   Specimen: Nasopharyngeal Swab  Result Value Ref Range Status   SARS Coronavirus 2 by RT PCR NEGATIVE NEGATIVE Final    Comment: (NOTE) SARS-CoV-2 target nucleic acids are NOT DETECTED. The SARS-CoV-2 RNA is generally detectable in upper respiratoy specimens during the acute phase of infection. The lowest concentration of SARS-CoV-2 viral copies this assay can detect is 131 copies/mL. A negative result does not preclude SARS-Cov-2 infection and should not be used as the sole basis for treatment or other patient management decisions. A negative result may occur with  improper specimen collection/handling, submission of specimen other than nasopharyngeal swab, presence of viral mutation(s) within the areas targeted by this assay, and inadequate number of viral copies (<131 copies/mL). A negative result must be combined with clinical observations, patient history, and epidemiological information. The expected result is Negative. Fact Sheet for Patients:  PinkCheek.be Fact Sheet for Healthcare Providers:  GravelBags.it This test is not yet ap proved or cleared by the [place] FDA and  has been authorized for detection and/or diagnosis of SARS-CoV-2 by FDA under an Emergency Use Authorization (EUA). This EUA will remain  in effect (meaning this test can be used) for the duration of the COVID-19 declaration under Section 564(b)(1) of the Act, 21 U.S.C. section 360bbb-3(b)(1), unless the authorization is terminated or revoked sooner.    Influenza A by PCR NEGATIVE NEGATIVE Final   Influenza  B by PCR NEGATIVE NEGATIVE Final    Comment: (NOTE) The Xpert Xpress SARS-CoV-2/FLU/RSV assay is intended as an aid in  the diagnosis of influenza from Nasopharyngeal swab specimens and  should not be used as a sole basis for treatment. Nasal washings and  aspirates are unacceptable for Xpert Xpress SARS-CoV-2/FLU/RSV  testing. Fact Sheet for Patients: PinkCheek.be Fact Sheet for Healthcare Providers: GravelBags.it This test is not yet approved or cleared by the [place] FDA and  has been authorized for detection and/or diagnosis of SARS-CoV-2 by  FDA under an Emergency Use Authorization (EUA). This EUA will remain  in effect (meaning this test can be used) for the duration  of the  Covid-19 declaration under Section 564(b)(1) of the Act, 21  U.S.C. section 360bbb-3(b)(1), unless the authorization is  terminated or revoked. Performed at [facility], [place] [address], [place], [place] [address]   MRSA PCR Screening     Status: None   Collection Time: 06/11/19 12:19 PM   Specimen: Nasopharyngeal  Result Value Ref Range Status   MRSA by PCR NEGATIVE NEGATIVE Final    Comment:        The GeneXpert MRSA Assay (FDA approved for NASAL specimens only), is one component of a comprehensive MRSA colonization surveillance program. It is not intended to diagnose MRSA infection nor to guide or monitor treatment for MRSA infections. Performed at [facility], [place] [address] [place], [place] [address]          Radiology Studies: MRI Right foot with and without contrast  Result Date: 06/11/2019 CLINICAL DATA:  Revascularization procedure and partial amputation through the 1st and 2nd metatarsals on 06/02/2019. Patient fell 3 days ago with increasing right foot pain and discharge. EXAM: MRI OF THE RIGHT FOREFOOT WITHOUT AND WITH CONTRAST TECHNIQUE: Multiplanar, multisequence MR imaging of the right  forefoot was performed before and after the administration of intravenous contrast. CONTRAST:  1mL GADAVIST GADOBUTROL 1 MMOL/ML IV SOLN COMPARISON:  Radiographs 06/11/2019 and 05/27/2019. FINDINGS: Despite efforts by the technologist and patient, mild motion artifact is present on today's exam and could not be eliminated. This reduces exam sensitivity and specificity. Bones/Joint/Cartilage Status post amputation through the mid shaft of the 1st metatarsal and through the distal shaft of the 2nd metatarsal. Low level enhancement along the distal margins of the remaining 1st and 2nd metatarsals is nonspecific in light of the recent surgery. No cortical destruction identified. The 3rd, 4th and 5th rays appear unremarkable. The tarsal bones appear unremarkable. Ligaments Intact Lisfranc ligament. Muscles and Tendons No focal intramuscular fluid collections or suspicious enhancement. No significant tenosynovitis. Soft tissues Irregular fluid collections are noted adjacent to the amputation sites with peripheral enhancement following contrast. These collections measure up to 3.1 cm on image 10/3. The collections extend dorsally, although no well-defined sinus tract to the skin is identified. There is no soft tissue emphysema. There is generalized subcutaneous edema throughout the forefoot. IMPRESSION: 1. Status post amputation through the mid shaft of the 1st metatarsal and through the distal shaft of the 2nd metatarsal. Low level enhancement along the distal margins of the 1st and 2nd metatarsals is nonspecific in light of the recent surgery. No cortical destruction or soft tissue emphysema identified. 2. Irregular fluid collections adjacent to the amputation sites are nonspecific, although could reflect abscesses, especially if there is drainage of purulent fluid. No well-defined sinus tract identified. Electronically Signed   By: Richardean Sale M.D.   On: 06/11/2019 15:05   DG Foot Complete Right  Result Date:  06/11/2019 CLINICAL DATA:  Right foot pain. Surgery to remove 2 toes from right foot 06/02/2019. EXAM: RIGHT FOOT COMPLETE - 3+ VIEW COMPARISON:  Preoperative radiograph 05/27/2019 FINDINGS: Transmetatarsal resection of the first and second rays. Developing heterotopic calcification at the resection margin. Slight irregularity of the surgical margin of the second metatarsal. The remainder the foot is intact. No acute fracture. Small plantar calcaneal spur. Remote distal fibular fracture. 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 Slight developing heterotopic ossification at the operative bed. Resection margin of the second ray is minimally irregular, this may be normal postoperative healing or early osteomyelitis. Consider  further evaluation with MRI based on clinical concern. Electronically Signed   By: Keith Rake M.D.   On: 06/11/2019 02:08   DG HIP UNILAT WITH PELVIS 2-3 VIEWS RIGHT  Result Date: 06/11/2019 CLINICAL DATA:  Fall yesterday.  Right hip pain. EXAM: DG HIP (WITH OR WITHOUT PELVIS) 2-3V RIGHT COMPARISON:  None. FINDINGS: The cortical margins of the bony pelvis and right hip are intact. No fracture. Pubic symphysis and sacroiliac joints are congruent. Both femoral heads are well-seated in the respective acetabula. Bones are under mineralized. Surgical hardware in the lower lumbar spine is partially included. There is mild soft tissue edema lateral to the right hip. IMPRESSION: 1. No acute fracture of the pelvis or right hip. 2. Lateral soft tissue edema. Electronically Signed   By: Keith Rake M.D.   On: 06/11/2019 02:05      Scheduled Meds: . aspirin EC  81 mg Oral Daily  . clopidogrel  75 mg Oral Daily  . enoxaparin (LOVENOX) injection  40 mg Subcutaneous Q24H  . feeding supplement (ENSURE ENLIVE)  237 mL Oral BID BM  . insulin aspart  0-5 Units Subcutaneous QHS  . insulin aspart  0-9 Units Subcutaneous TID WC  . levothyroxine  50 mcg Oral  QAC breakfast  . loratadine  10 mg Oral Daily  . metoprolol succinate  25 mg Oral Daily  . multivitamin  1 tablet Oral Daily  . nutrition supplement (JUVEN)  1 packet Oral BID BM  . ranolazine  1,000 mg Oral BID  . rosuvastatin  10 mg Oral Daily  . sertraline  250 mg Oral Daily   Continuous Infusions: . cefTRIAXone (ROCEPHIN)  IV Stopped (06/11/19 1620)   And  . metronidazole 500 mg (06/12/19 0031)     LOS: 1 day      Debbe Odea, MD [facility] Pager: [email or web address] Password [facility] 06/12/2019, 9:48 AM

## 2019-06-12 NOTE — Plan of Care (Signed)
  Problem: Clinical Measurements: Goal: Will remain free from infection Outcome: Progressing Goal: Cardiovascular complication will be avoided Outcome: Progressing   Problem: Activity: Goal: Risk for activity intolerance will decrease Outcome: Progressing   Problem: Coping: Goal: Level of anxiety will decrease Outcome: Progressing   Problem: Pain Managment: Goal: General experience of comfort will improve Outcome: Progressing   Problem: Safety: Goal: Ability to remain free from injury will improve Outcome: Progressing   Problem: Skin Integrity: Goal: Risk for impaired skin integrity will decrease Outcome: Progressing

## 2019-06-12 NOTE — Progress Notes (Addendum)
[facility]  Subjective  - No new complaints   Objective (!) 122/57 (!) 59 97.6 F (36.4 C) (Oral) 16 (!) 69%  Intake/Output Summary (Last 24 hours) at 06/12/2019 0946 Last data filed at 06/12/2019 0031 Gross per 24 hour  Intake 870.53 ml  Output 1300 ml  Net -429.47 ml   Dressing clean and dry just changed by RN, RN reports no change in incision appearence. Leg incision healing well without erythema.  Groin soft and healing well Doppler signal intact AT/PT GEN NAD Lungs non labored breathing  Assessment/Planning: S/P right extensive common femoral endarterectomy with Dacron patch and femoral to below-knee popliteal artery bypass with graft and amputation of first and second toes.  Afebrile without with leukcytosis Cont. IV antibiotics.  Roxy Horseman 06/12/2019 9:46 AM --  Laboratory Lab Results: Recent Labs    06/11/19 0230 06/11/19 0243 06/12/19 0448  WBC 11.3*  --  8.3  HGB 10.3* 10.5* 10.0*  HCT 31.9* 31.0* 30.3*  PLT 366  --  355   BMET Recent Labs    06/11/19 0243 06/12/19 0448  NA 131* 130*  K 4.9 4.6  CL 95* 95*  CO2  --  24  GLUCOSE 95 129*  BUN 14 14  CREATININE 1.20 1.18  CALCIUM  --  9.4    COAG Lab Results  Component Value Date   INR 1.01 10/23/2016   INR 0.94 03/21/2015   INR 0.97 03/09/2015   No results found for: PTT

## 2019-06-13 LAB — GLUCOSE, CAPILLARY
Glucose-Capillary: 127 mg/dL — ABNORMAL HIGH (ref 70–99)
Glucose-Capillary: 151 mg/dL — ABNORMAL HIGH (ref 70–99)
Glucose-Capillary: 95 mg/dL (ref 70–99)
Glucose-Capillary: 97 mg/dL (ref 70–99)

## 2019-06-13 NOTE — Plan of Care (Signed)
  Problem: Activity: Goal: Risk for activity intolerance will decrease Outcome: Progressing   Problem: Pain Managment: Goal: General experience of comfort will improve Outcome: Progressing   Problem: Safety: Goal: Ability to remain free from injury will improve Outcome: Progressing   Problem: Skin Integrity: Goal: Risk for impaired skin integrity will decrease Outcome: Progressing   Problem: Clinical Measurements: Goal: Will remain free from infection Outcome: Progressing

## 2019-06-13 NOTE — Progress Notes (Signed)
PROGRESS NOTE    Jason [name]   [id number]  DOB: 10-06-1950  DOA: 06/11/2019 PCP: Patient, [name]   Brief Narrative:  Jason [name] is a 68 year old male with a past medical history of diabetes mellitus, coronary artery disease status post CABG and ongoing unstable angina, tobacco abuse, hypertension, hyperlipidemia, COPD, bilateral carotid artery disease, basal cell cancer and peripheral vascular disease status post right common femoral to femoral endarterectomy Dacron patch angioplasty, below the knee popliteal artery bypass and amputation of first and second toes 12/10. Patient presented to the ED from [facility] where he fell on 12/18.  He presented essentially for hip pain.  While in the ED, he was noted to have cellulitis of the foot and was therefore was recommended by vascular surgery to be admitted to the triad hospitalist service.     Subjective: The patient was seen and examined this morning, sitting in a sided bed, stable no acute distress.  Reporting swelling of the right foot and leg has been improving. No complaints currently No issues overnight.       Assessment & Plan:   Principal Problem:    Cellulitis of right foot/peripheral vascular disease Postop day 11 Status post right femoral popliteal bypass with graft and first and second toe amputation   -Remained hemodynamically stable, afebrile normotensive  -Status post extensive vascular surgery and amputations on 12/10 as mentioned above -Mild cellulitis of the right foot currently being treated with ceftriaxone and Flagyl-see pictures below -Continue wound care -Blood cultures negative to date  Active Problems:  Right hip pain after a fall -Pain is improving- will request a PT eval - he should be able to return to skilled nursing    DM (diabetes mellitus), uncontrolled with hyperglycemia -Last A1c on 12/4 was 8.5 -He is on Janumet as outpatient  -currently receiving a  NovoLog  -CBG QA CHS, SSI    Unstable angina/coronary artery disease status post CABG -Continue aspirin, Plavix, Ranexa, metoprolol and Crestor -Remained stable, denies any chest pain.    Tobacco abuse -Continues to smoke 1 pack a day of cigarettes-I have counseled him to discontinue     Hypothyroidism -Continue Synthroid  Time spent in minutes: 30 DVT prophylaxis: Lovenox Code Status: Full code Family Communication:  Disposition Plan: Plan to return to skilled nursing when infection improved--- appreciate SW assistance Consultants:   Vascular surgery Procedures:   Status post right femoral popliteal bypass with graft and first and second toe amputation -Status post extensive vascular surgery and amputations on 06/02/2019  Antimicrobials:  Anti-infectives (From admission, onward)   Start     Dose/Rate Route Frequency Ordered Stop   06/11/19 1715  metroNIDAZOLE (FLAGYL) IVPB 500 mg     500 mg 100 mL/hr over 60 Minutes Intravenous Every 8 hours 06/11/19 0925     06/11/19 1330  cefTRIAXone (ROCEPHIN) 2 g in sodium chloride 0.9 % 100 mL IVPB     2 g 200 mL/hr over 30 Minutes Intravenous Every 24 hours 06/11/19 0925     06/11/19 0907  cefTRIAXone (ROCEPHIN) 2 g in sodium chloride 0.9 % 100 mL IVPB  Status:  Discontinued     2 g 200 mL/hr over 30 Minutes Intravenous Every 24 hours 06/11/19 0907 06/11/19 0925   06/11/19 0907  metroNIDAZOLE (FLAGYL) IVPB 500 mg  Status:  Discontinued     500 mg 100 mL/hr over 60 Minutes Intravenous Every 8 hours 06/11/19 0907 06/11/19 0925   06/11/19 0300  vancomycin (  VANCOCIN) IVPB 1000 mg/200 mL premix     1,000 mg 200 mL/hr over 60 Minutes Intravenous  Once 06/11/19 0256 06/11/19 0525   06/11/19 0300  piperacillin-tazobactam (ZOSYN) IVPB 3.375 g     3.375 g 100 mL/hr over 30 Minutes Intravenous  Once 06/11/19 0256 06/11/19 0559       Objective: Vitals:   06/13/19 0410 06/13/19 0415 06/13/19 0500 06/13/19 0803  BP: (!) 140/59   (!)  93/51  Pulse: 73   63  Resp: 16   16  Temp: (!) 97.5 F (36.4 C)   98.3 F (36.8 C)  TempSrc: Oral   Oral  SpO2: (!) 83% 98%  97%  Weight:   75.4 kg   Height:        Intake/Output Summary (Last 24 hours) at 06/13/2019 1123 Last data filed at 06/12/2019 1821 Gross per 24 hour  Intake 360 ml  Output 1100 ml  Net -740 ml   Filed Weights   06/11/19 0037 06/12/19 0500 06/13/19 0500  Weight: 75.3 kg 75 kg 75.4 kg   BP (!) 93/51 (BP Location: Right Arm) Comment: I let know nurse  Pulse 63   Temp 98.3 F (36.8 C) (Oral)   Resp 16   Ht 5\' 9"  (1.753 m)   Wt 75.4 kg   SpO2 97%   BMI 24.55 kg/m    Physical Exam  Constitution:  Alert, cooperative, no distress,  Psychiatric: Normal and stable mood and affect, cognition intact,   HEENT: Normocephalic, PERRL, otherwise with in Normal limits  Chest:Chest symmetric Cardio vascular:  S1/S2, RRR, No murmure, No Rubs or Gallops  pulmonary: Clear to auscultation bilaterally, respirations unlabored, negative wheezes / crackles Abdomen: Soft, non-tender, non-distended, bowel sounds,no masses, no organomegaly Muscular skeletal: Limited exam - in bed, able to move all 4 extremities, Normal strength,  Neuro: CNII-XII intact. , normal motor and sensation, reflexes intact  Extremities: No pitting edema lower extremities, +2 pulses  Skin: Dry, warm to touch, negative for any Rashes, ++  wounds Wounds: per nursing documentation         Psychiatry:  Mood & affect appropriate.     Data Reviewed: I have personally reviewed following labs and imaging studies  CBC: Recent Labs  Lab 06/07/19 0316 06/11/19 0230 06/11/19 0243 06/12/19 0448  WBC 11.0* 11.3*  --  8.3  NEUTROABS 7.3 7.8*  --  5.4  HGB 9.1* 10.3* 10.5* 10.0*  HCT 27.6* 31.9* 31.0* 30.3*  MCV 96.8 100.0  --  95.6  PLT 327 366  --  [id number]   Basic Metabolic Panel: Recent Labs  Lab 06/11/19 0243 06/12/19 0448  NA 131* 130*  K 4.9 4.6  CL 95* 95*  CO2  --  24  GLUCOSE  95 129*  BUN 14 14  CREATININE 1.20 1.18  CALCIUM  --  9.4  MG  --  1.7  PHOS  --  4.2   GFR: Estimated Creatinine Clearance: 61.6 mL/min (by C-G formula based on SCr of 1.18 mg/dL). Liver Function Tests: Recent Labs  Lab 06/12/19 0448  AST 20  ALT 15  ALKPHOS 76  BILITOT 0.5  PROT 7.0  ALBUMIN 3.2*   Recent Labs  Lab 06/12/19 1159 06/12/19 1640 06/13/19 0054 06/13/19 0659 06/13/19 1110  GLUCAP 143* 116* 97 95 151*   Lipid Profile: No results for input(s): CHOL, HDL, LDLCALC, TRIG, CHOLHDL, LDLDIRECT in the last 72 hours. Thyroid Function Tests: Recent Labs    06/11/19 0946  TSH 6.423*  Anemia Panel: No results for input(s): VITAMINB12, FOLATE, FERRITIN, TIBC, IRON, RETICCTPCT in the last 72 hours. Urine analysis:    Component Value Date/Time   COLORURINE YELLOW 07/08/2013 0226   APPEARANCEUR CLEAR 07/08/2013 0226   LABSPEC 1.006 07/08/2013 0226   PHURINE 6.0 07/08/2013 0226   GLUCOSEU NEGATIVE 07/08/2013 0226   HGBUR NEGATIVE 07/08/2013 0226   BILIRUBINUR NEGATIVE 07/08/2013 0226   KETONESUR NEGATIVE 07/08/2013 0226   PROTEINUR NEGATIVE 07/08/2013 0226   UROBILINOGEN 0.2 07/08/2013 0226   NITRITE NEGATIVE 07/08/2013 0226   LEUKOCYTESUR NEGATIVE 07/08/2013 0226   Sepsis Labs: @LABRCNTIP (procalcitonin:4,lacticidven:4) ) Recent Results (from the past 240 hour(s))  SARS CORONAVIRUS 2 (TAT 6-24 HRS) Nasopharyngeal Nasopharyngeal Swab     Status: None   Collection Time: 06/06/19  1:14 PM   Specimen: Nasopharyngeal Swab  Result Value Ref Range Status   SARS Coronavirus 2 NEGATIVE NEGATIVE Final    Comment: (NOTE) SARS-CoV-2 target nucleic acids are NOT DETECTED. The SARS-CoV-2 RNA is generally detectable in upper and lower respiratory specimens during the acute phase of infection. Negative results do not preclude SARS-CoV-2 infection, do not rule out co-infections with other pathogens, and should not be used as the sole basis for treatment or other  patient management decisions. Negative results must be combined with clinical observations, patient history, and epidemiological information. The expected result is Negative. Fact Sheet for Patients: SugarRoll.be Fact Sheet for Healthcare Providers: https://www.woods-mathews.com/ This test is not yet approved or cleared by the [place] FDA and  has been authorized for detection and/or diagnosis of SARS-CoV-2 by FDA under an Emergency Use Authorization (EUA). This EUA will remain  in effect (meaning this test can be used) for the duration of the COVID-19 declaration under Section 56 4(b)(1) of the Act, 21 U.S.C. section 360bbb-3(b)(1), unless the authorization is terminated or revoked sooner. Performed at [facility], [place] [address] [place], [place] [address]   Respiratory Panel by RT PCR (Flu A&B, Covid) - Nasopharyngeal Swab     Status: None   Collection Time: 06/11/19  2:30 AM   Specimen: Nasopharyngeal Swab  Result Value Ref Range Status   SARS Coronavirus 2 by RT PCR NEGATIVE NEGATIVE Final    Comment: (NOTE) SARS-CoV-2 target nucleic acids are NOT DETECTED. The SARS-CoV-2 RNA is generally detectable in upper respiratoy specimens during the acute phase of infection. The lowest concentration of SARS-CoV-2 viral copies this assay can detect is 131 copies/mL. A negative result does not preclude SARS-Cov-2 infection and should not be used as the sole basis for treatment or other patient management decisions. A negative result may occur with  improper specimen collection/handling, submission of specimen other than nasopharyngeal swab, presence of viral mutation(s) within the areas targeted by this assay, and inadequate number of viral copies (<131 copies/mL). A negative result must be combined with clinical observations, patient history, and epidemiological information. The expected result is Negative. Fact Sheet for Patients:   PinkCheek.be Fact Sheet for Healthcare Providers:  GravelBags.it This test is not yet ap proved or cleared by the [place] FDA and  has been authorized for detection and/or diagnosis of SARS-CoV-2 by FDA under an Emergency Use Authorization (EUA). This EUA will remain  in effect (meaning this test can be used) for the duration of the COVID-19 declaration under Section 564(b)(1) of the Act, 21 U.S.C. section 360bbb-3(b)(1), unless the authorization is terminated or revoked sooner.    Influenza A by PCR NEGATIVE NEGATIVE Final   Influenza B by PCR NEGATIVE  NEGATIVE Final    Comment: (NOTE) The Xpert Xpress SARS-CoV-2/FLU/RSV assay is intended as an aid in  the diagnosis of influenza from Nasopharyngeal swab specimens and  should not be used as a sole basis for treatment. Nasal washings and  aspirates are unacceptable for Xpert Xpress SARS-CoV-2/FLU/RSV  testing. Fact Sheet for Patients: PinkCheek.be Fact Sheet for Healthcare Providers: GravelBags.it This test is not yet approved or cleared by the [place] FDA and  has been authorized for detection and/or diagnosis of SARS-CoV-2 by  FDA under an Emergency Use Authorization (EUA). This EUA will remain  in effect (meaning this test can be used) for the duration of the  Covid-19 declaration under Section 564(b)(1) of the Act, 21  U.S.C. section 360bbb-3(b)(1), unless the authorization is  terminated or revoked. Performed at [facility], [place] [address] [place], [place] [address]   Blood culture (routine x 2)     Status: None (Preliminary result)   Collection Time: 06/11/19  3:58 AM   Specimen: BLOOD RIGHT HAND  Result Value Ref Range Status   Specimen Description   Final    BLOOD RIGHT HAND Performed at [facility] [address], [place], [place] [address]    Special  Requests   Final    BOTTLES DRAWN AEROBIC AND ANAEROBIC Blood Culture adequate volume Performed at [facility] [address] [place], [place] [address]    Culture   Final    NO GROWTH 2 DAYS Performed at [facility] [address], [place], [place] [address]    Report Status PENDING  Incomplete  MRSA PCR Screening     Status: None   Collection Time: 06/11/19 12:19 PM   Specimen: Nasopharyngeal  Result Value Ref Range Status   MRSA by PCR NEGATIVE NEGATIVE Final    Comment:        The GeneXpert MRSA Assay (FDA approved for NASAL specimens only), is one component of a comprehensive MRSA colonization surveillance program. It is not intended to diagnose MRSA infection nor to guide or monitor treatment for MRSA infections. Performed at [facility], [place] [address], [place],  [address]          Radiology Studies: MRI Right foot with and without contrast  Result Date: 06/11/2019 CLINICAL DATA:  Revascularization procedure and partial amputation through the 1st and 2nd metatarsals on 06/02/2019. Patient fell 3 days ago with increasing right foot pain and discharge. EXAM: MRI OF THE RIGHT FOREFOOT WITHOUT AND WITH CONTRAST TECHNIQUE: Multiplanar, multisequence MR imaging of the right forefoot was performed before and after the administration of intravenous contrast. CONTRAST:  30mL GADAVIST GADOBUTROL 1 MMOL/ML IV SOLN COMPARISON:  Radiographs 06/11/2019 and 05/27/2019. FINDINGS: Despite efforts by the technologist and patient, mild motion artifact is present on today's exam and could not be eliminated. This reduces exam sensitivity and specificity. Bones/Joint/Cartilage Status post amputation through the mid shaft of the 1st metatarsal and through the distal shaft of the 2nd metatarsal. Low level enhancement along the distal margins of the remaining 1st and 2nd metatarsals is nonspecific in light of the recent surgery. No cortical destruction  identified. The 3rd, 4th and 5th rays appear unremarkable. The tarsal bones appear unremarkable. Ligaments Intact Lisfranc ligament. Muscles and Tendons No focal intramuscular fluid collections or suspicious enhancement. No significant tenosynovitis. Soft tissues Irregular fluid collections are noted adjacent to the amputation sites with peripheral enhancement following contrast. These collections measure up to 3.1 cm on  image 10/3. The collections extend dorsally, although no well-defined sinus tract to the skin is identified. There is no soft tissue emphysema. There is generalized subcutaneous edema throughout the forefoot. IMPRESSION: 1. Status post amputation through the mid shaft of the 1st metatarsal and through the distal shaft of the 2nd metatarsal. Low level enhancement along the distal margins of the 1st and 2nd metatarsals is nonspecific in light of the recent surgery. No cortical destruction or soft tissue emphysema identified. 2. Irregular fluid collections adjacent to the amputation sites are nonspecific, although could reflect abscesses, especially if there is drainage of purulent fluid. No well-defined sinus tract identified. Electronically Signed   By: Richardean Sale M.D.   On: 06/11/2019 15:05      Scheduled Meds: . aspirin EC  81 mg Oral Daily  . clopidogrel  75 mg Oral Daily  . enoxaparin (LOVENOX) injection  40 mg Subcutaneous Q24H  . feeding supplement (ENSURE ENLIVE)  237 mL Oral BID BM  . insulin aspart  0-5 Units Subcutaneous QHS  . insulin aspart  0-9 Units Subcutaneous TID WC  . levothyroxine  50 mcg Oral QAC breakfast  . loratadine  10 mg Oral Daily  . metoprolol succinate  25 mg Oral Daily  . multivitamin  1 tablet Oral Daily  . nicotine  21 mg Transdermal Daily  . nutrition supplement (JUVEN)  1 packet Oral BID BM  . ranolazine  1,000 mg Oral BID  . rosuvastatin  10 mg Oral Daily  . sertraline  250 mg Oral Daily   Continuous Infusions: . cefTRIAXone (ROCEPHIN)   IV 2 g (06/12/19 1249)   And  . metronidazole 500 mg (06/13/19 0852)     LOS: 2 days      Deatra James, MD [facility] Pager: [email or web address] Password [facility] 06/13/2019, 11:23 AM

## 2019-06-13 NOTE — Plan of Care (Signed)

## 2019-06-13 NOTE — Progress Notes (Addendum)
[facility]   Subjective  - No new complaints.  Denise fever and chills.   Objective (!) 93/51 63 98.3 F (36.8 C) (Oral) 16 97%  Intake/Output Summary (Last 24 hours) at 06/13/2019 0838 Last data filed at 06/12/2019 1821 Gross per 24 hour  Intake 360 ml  Output 1100 ml  Net -740 ml    Right LE incisions healing well, groin soft.  Right foot amputation site with dorsal erythema, suture line intact. Right foot warm to touch with intact active range of motion of toes and ankle.  Assessment/Planning: POD #11 Right fem-popliteal bypass with Gore graft and 1st and second toe amputations.    Dressing changed.  No drainage, no change in erythema.  Patent bypass. Blood cultures negative to date.  Roxy Horseman 06/13/2019 8:38 AM --  Laboratory Lab Results: Recent Labs    06/11/19 0230 06/11/19 0243 06/12/19 0448  WBC 11.3*  --  8.3  HGB 10.3* 10.5* 10.0*  HCT 31.9* 31.0* 30.3*  PLT 366  --  355   BMET Recent Labs    06/11/19 0243 06/12/19 0448  NA 131* 130*  K 4.9 4.6  CL 95* 95*  CO2  --  24  GLUCOSE 95 129*  BUN 14 14  CREATININE 1.20 1.18  CALCIUM  --  9.4    COAG Lab Results  Component Value Date   INR 1.01 10/23/2016   INR 0.94 03/21/2015   INR 0.97 03/09/2015   No results found for: PTT   I have examined the patient, reviewed and agree with above.  Comfortable overall.  Does have some soreness in the right posterior calf.  Surgical incisions healing quite well for his femoropopliteal bypass.  His first and second toe amputation is healing very nicely with minimal erythema on the dorsum of his foot.  Stable expected recovery following right femoral-popliteal bypass and partial second toe amputation.  Will not follow actively in the hospital.  We will keep his follow-up with Dr.Cain as an outpatient in several weeks for removal of sutures in his toe amputation site  Curt Jews, MD 06/13/2019 4:13  PM

## 2019-06-13 NOTE — Progress Notes (Signed)
Orthopedic Tech Progress Note Patient Details:  Jason [name] [birth year] [id number]  Ortho Devices Type of Ortho Device: Darco shoe Ortho Device/Splint Location: right Ortho Device/Splint Interventions: Application   Post Interventions Patient Tolerated: Well Instructions Provided: Care of device   [place] Pink 06/13/2019, 11:49 AM

## 2019-06-14 LAB — GLUCOSE, CAPILLARY
Glucose-Capillary: 103 mg/dL — ABNORMAL HIGH (ref 70–99)
Glucose-Capillary: 168 mg/dL — ABNORMAL HIGH (ref 70–99)
Glucose-Capillary: 99 mg/dL (ref 70–99)
Glucose-Capillary: 115 mg/dL — ABNORMAL HIGH (ref 70–99)

## 2019-06-14 LAB — SARS CORONAVIRUS 2 (TAT 6-24 HRS): SARS Coronavirus 2: NEGATIVE

## 2019-06-14 MED ORDER — METRONIDAZOLE 500 MG PO TABS
500.0000 mg | ORAL_TABLET | Freq: Three times a day (TID) | ORAL | Status: DC
  Administered 2019-06-14 – 2019-06-16 (×7): 500 mg via ORAL
  Filled 2019-06-14 (×7): qty 1

## 2019-06-14 MED ORDER — OXYCODONE-ACETAMINOPHEN 5-325 MG PO TABS: 1.0000 | ORAL_TABLET | Freq: Four times a day (QID) | ORAL | 0 refills | Status: DC | PRN

## 2019-06-14 MED ORDER — CLINDAMYCIN HCL 300 MG PO CAPS: 300.0000 mg | ORAL_CAPSULE | Freq: Three times a day (TID) | ORAL | 0 refills | Status: AC

## 2019-06-14 MED ORDER — LACTINEX PO CHEW: 1.0000 | CHEWABLE_TABLET | Freq: Three times a day (TID) | ORAL | 0 refills | Status: AC

## 2019-06-14 NOTE — Plan of Care (Signed)
  Problem: Pain Managment: Goal: General experience of comfort will improve Outcome: Progressing   Problem: Safety: Goal: Ability to remain free from injury will improve Outcome: Progressing   

## 2019-06-14 NOTE — Evaluation (Signed)
Physical Therapy Evaluation Patient Details Name: Jason [name] MRN: [id number] DOB: 08/12/1950 Today's Date: 06/14/2019   History of Present Illness  Pt is a 68 y.o. male recently s/p R femoral popliteal bypass graft and first and second toe amputation on 06/02/19 and d/c to SNF, now readmitted 06/11/19 after fall with c/o hip pain. No acute fx to R hip/pelvis. Found to have R foot cellulitis. Other PMH includes HTN, MI, DM, CVA, CAD, COPD, arthritis, depression.    Clinical Impression  Pt presents with an overall decrease in functional mobility secondary to above. Prior to initial admission 06/02/19, pt independent and lives alone, recent dc to SNF after R toe amputations. Today, pt requiring min-modA for mobility with RW, limited by generalized weakness and pain; at high risk for falls. Educ on precautions, positioning, therex, and importance of mobility. Pt would benefit from continued acute PT services to maximize functional mobility and independence prior to d/c with continued SNF-level therapies.     Follow Up Recommendations SNF;Supervision/Assistance - 24 hour    Equipment Recommendations  Rolling walker with 5" wheels    Recommendations for Other Services       Precautions / Restrictions Precautions Precautions: Fall Restrictions Weight Bearing Restrictions: Yes Other Position/Activity Restrictions: WB thru heel w/ darco shoe      Mobility  Bed Mobility               General bed mobility comments: Received sitting in recliner  Transfers Overall transfer level: Needs assistance Equipment used: Rolling walker (2 wheeled) Transfers: Sit to/from Stand Sit to Stand: Mod assist;Min assist         General transfer comment: Initial modA for trunk elevation standing from EOB to RW; 2x more trials from recliner, pt requiring minA progressing to min guard, heavy reliance on BUE support to push into standing  Ambulation/Gait Ambulation/Gait assistance: Min  assist Gait Distance (Feet): 10 Feet Assistive device: Rolling walker (2 wheeled) Gait Pattern/deviations: Step-to pattern;Decreased weight shift to right;Trunk flexed;Antalgic;Leaning posteriorly     General Gait Details: Amb 5' from bed to recliner with RW, pt very unstable with c/o RLE pain requiring minA for balance and max cues for sequencing/safety with RW. Additional 5' forwards/backwards with RW, stability improved with min guard to minA for balance  Stairs            Wheelchair Mobility    Modified Rankin (Stroke Patients Only)       Balance Overall balance assessment: Needs assistance Sitting-balance support: No upper extremity supported;Feet supported Sitting balance-Leahy Scale: Good       Standing balance-Leahy Scale: Poor Standing balance comment: Reliant on BUE support; external assist for dynamic stability                             Pertinent Vitals/Pain Pain Assessment: Faces Faces Pain Scale: Hurts little more Pain Location: RLE Pain Descriptors / Indicators: Burning Pain Intervention(s): Monitored during session;Repositioned    Home Living Family/patient expects to be discharged to:: Skilled nursing facility                 Additional Comments: Initially home alone, recent d/c to [facility] for rehab.    Prior Function Level of Independence: Needs assistance         Comments: Was independent prior to initial admission 05/2019; has been at SNF for rehab the past week for LE strengthening due to R foot sx  Hand Dominance        Extremity/Trunk Assessment   Upper Extremity Assessment Upper Extremity Assessment: Overall WFL for tasks assessed    Lower Extremity Assessment Lower Extremity Assessment: RLE deficits/detail RLE Deficits / Details: R hip and knee flex/ext >3/5 RLE Sensation: decreased light touch LLE Sensation: decreased light touch    Cervical / Trunk Assessment Cervical / Trunk Assessment:  Normal  Communication   Communication: No difficulties  Cognition Arousal/Alertness: Awake/alert Behavior During Therapy: WFL for tasks assessed/performed Overall Cognitive Status: No family/caregiver present to determine baseline cognitive functioning Area of Impairment: Safety/judgement                         Safety/Judgement: Decreased awareness of safety;Decreased awareness of deficits            General Comments      Exercises Other Exercises Other Exercises: Seated LAQ x10, hip flexion x10, gastroc stretch with blanket (20-30 sec hold x3)   Assessment/Plan    PT Assessment Patient needs continued PT services  PT Problem List Decreased strength;Decreased range of motion;Decreased activity tolerance;Decreased balance;Decreased mobility;Decreased knowledge of use of DME;Decreased safety awareness;Decreased knowledge of precautions;Impaired sensation;Pain       PT Treatment Interventions DME instruction;Gait training;Functional mobility training;Stair training;Therapeutic activities;Therapeutic exercise;Balance training;Neuromuscular re-education;Patient/family education    PT Goals (Current goals can be found in the Care Plan section)  Acute Rehab PT Goals Patient Stated Goal: Agreeable for return to SNF for continued rehab PT Goal Formulation: With patient Time For Goal Achievement: 06/28/19 Potential to Achieve Goals: Good    Frequency Min 2X/week   Barriers to discharge        Co-evaluation               AM-PAC PT "6 Clicks" Mobility  Outcome Measure Help needed turning from your back to your side while in a flat bed without using bedrails?: None Help needed moving from lying on your back to sitting on the side of a flat bed without using bedrails?: None Help needed moving to and from a bed to a chair (including a wheelchair)?: A Lot Help needed standing up from a chair using your arms (e.g., wheelchair or bedside chair)?: A Little Help  needed to walk in hospital room?: A Lot Help needed climbing 3-5 steps with a railing? : A Lot 6 Click Score: 17    End of Session   Activity Tolerance: Patient tolerated treatment well;Patient limited by pain Patient left: in chair;with call bell/phone within reach;with chair alarm set Nurse Communication: Mobility status PT Visit Diagnosis: Other abnormalities of gait and mobility (R26.89);Pain Pain - Right/Left: Right Pain - part of body: Leg    Time: 1410-1427 PT Time Calculation (min) (ACUTE ONLY): 17 min   Charges:   PT Evaluation $PT Eval Moderate Complexity: 1 Mod        Mabeline Caras, PT, DPT Acute Rehabilitation Services  Pager [phone] Office [place] 06/14/2019, 2:38 PM

## 2019-06-14 NOTE — Discharge Summary (Addendum)
Physician Discharge Summary [facility]    Patient: Jason [name]                   Admit date: 06/11/2019   DOB: 1951/02/04             Discharge date:06/14/2019/11:24 AM [id number]                          PCP: Patient, [name]  Disposition: SNF  Recommendations for Outpatient Follow-up:   . Follow up: in 1 week  Discharge Condition: Stable   Code Status:   Code Status: Full Code  Diet recommendation: Diabetic diet  Addendum: Were unable to discharge to SNF today as patient needs reevaluation by PT/OT, another Covid test.  And FL 2.  Likely can be discharged in a.m.    Discharge Diagnoses:    Principal Problem:   Cellulitis of right foot Active Problems:   DM (diabetes mellitus) ([place])   Unstable angina ([place])   Tobacco abuse   Hypothyroidism   Diabetic foot infection ([place])   PAD (peripheral artery disease) ([place])   Nicotine abuse   History of Present Illness/ Hospital Course Jason [name] Summary:    Jason [name] is a 68 year old male with a past medical history of diabetes mellitus, coronary artery disease status post CABG and ongoing unstable angina, tobacco abuse, hypertension, hyperlipidemia, COPD, bilateral carotid artery disease, basal cell cancer and peripheral vascular disease status post right common femoral to femoral endarterectomy Dacron patch angioplasty, below the knee popliteal artery bypass and amputation of first and second toes 12/10. Patient presented to the ED from [facility] where he fell on 12/18.  He presented essentially for hip pain.  While in the ED, he was noted to have cellulitis of the foot and was therefore was recommended by vascular surgery to be admitted to the [facility] service.     Subjective: The patient was seen and examined this morning, sitting in a sided bed, stable no acute distress.  Reporting swelling of the right foot and leg has been improving. No complaints currently No  issues overnight.       Assessment & Plan:   Principal Problem:    Cellulitis of right foot/peripheral vascular disease Postop day 11 Status post right femoral popliteal bypass with graft and first and second toe amputation   -Remained hemodynamically stable, afebrile normotensive  -Status post extensive vascular surgery and amputations on 12/10 as mentioned above -Mild cellulitis of the right foot currently being treated with ceftriaxone and Flagyl-see pictures below -Continue wound care/vascular team were following-continue daily dressing changes -Blood cultures negative to date -Status post treatment with IV antibiotics of Rocephin and Flagyl x4 days, will be switch to p.o. clindamycin for 10 more days this will conclude 2 weeks of antibiotic coverage  Active Problems:  Right hip pain after a fall -Pain is improving-  return to skilled nursing    DM (diabetes mellitus), uncontrolled with hyperglycemia -Last A1c on 12/4 was 8.5 -He is on Janumet  -CBG QA CHS, SSI    Unstable angina/coronary artery disease status post CABG -Continue aspirin, Plavix, Ranexa, metoprolol and Crestor -Remained stable, denies any chest pain.    Tobacco abuse -Continues to smoke 1 pack a day of cigarettes-I have counseled him to discontinue     Hypothyroidism -Continue Synthroid   Code Status: Full code Family Communication:  Not at bedside plan of care discussed with the  patient in detail expresses understanding and agreement. Disposition Plan:  To skilled nursing -with p.o. antibiotics Consultants:   Vascular surgery  Follow-up with Dr.Cain as an outpatient in several weeks for removal of sutures in his toe amputation site   Procedures:   Status post right femoral popliteal bypass with graft and first and second toe amputation -Status post extensive vascular surgery and amputations on 06/02/2019  Antimicrobials:  IV Flagyl/Rocephin x4 days switched to p.o.  Clindamycin 300 mg p.o. 3 times daily for 10 more days will conclude 2 weeks of antibiotics   Nutritional status:  Nutrition Problem: Increased nutrient needs Etiology: wound healing(mild cellulitis of right foot s/p right firtst and second toe amputation) Signs/Symptoms: estimated needs Interventions: Juven, MVI, Ensure Enlive (each supplement provides 350kcal and 20 grams of protein)   Discharge Instructions:   Discharge Instructions    Activity as tolerated - No restrictions   Complete by: As directed    Call MD for:  difficulty breathing, headache or visual disturbances   Complete by: As directed    Call MD for:  redness, tenderness, or signs of infection (pain, swelling, redness, odor or green/yellow discharge around incision site)   Complete by: As directed    Call MD for:  temperature >100.4   Complete by: As directed    Diet - low sodium heart healthy   Complete by: As directed    Discharge instructions   Complete by: As directed    Please continue current recommended medication including antibiotics Needs wound care at the facility daily. All attempts should be taken to keep the wound clean, sterile   Increase activity slowly   Complete by: As directed        Medication List    TAKE these medications   acetaminophen 325 MG tablet Commonly known as: TYLENOL Take 650 mg by mouth every 4 (four) hours as needed for mild pain or headache.   aspirin 81 MG tablet Take 1 tablet (81 mg total) by mouth daily.   clindamycin 300 MG capsule Commonly known as: CLEOCIN Take 1 capsule (300 mg total) by mouth 3 (three) times daily for 10 days.   clopidogrel 75 MG tablet Commonly known as: PLAVIX Take 1 tablet (75 mg total) by mouth daily.   Janumet 50-500 MG tablet Generic drug: sitaGLIPtin-metformin Take 1 tablet by mouth 2 (two) times daily with a meal.   lactobacillus acidophilus & bulgar chewable tablet Chew 1 tablet by mouth 3 (three) times daily with meals for 12  days.   levothyroxine 50 MCG tablet Commonly known as: SYNTHROID TAKE 1 TABLET BY MOUTH EVERY DAY What changed: when to take this   loratadine 10 MG tablet Commonly known as: CLARITIN Take 10 mg by mouth daily.   metoprolol succinate 25 MG 24 hr tablet Commonly known as: TOPROL-XL Take 1 tablet (25 mg total) by mouth daily.   nitroGLYCERIN 0.4 MG SL tablet Commonly known as: NITROSTAT PLACE 1 TABLET (0.4 MG TOTAL) UNDER THE TONGUE EVERY 5 (FIVE) MINUTES AS NEEDED FOR CHEST PAIN.   oxyCODONE-acetaminophen 5-325 MG tablet Commonly known as: PERCOCET/ROXICET Take 1 tablet by mouth every 6 (six) hours as needed for up to 4 days for severe pain.   ranolazine 1000 MG SR tablet Commonly known as: RANEXA TAKE 1 TABLET BY MOUTH 2 TIMES DAILY. NEED OV. What changed:   how much to take  how to take this  when to take this   rosuvastatin 10 MG tablet Commonly known as: CRESTOR  Take 10 mg by mouth daily.   sertraline 100 MG tablet Commonly known as: ZOLOFT Take 2 and 1/2 tablets by mouth daily       Allergies  Allergen Reactions  . Coreg [Carvedilol] Nausea And Vomiting and Other (See Comments)    Per patient made him dizzy and light sensitive  . Sulfa Antibiotics Nausea And Vomiting and Other (See Comments)    Also headaches      Procedures /Studies:   DG Chest 1 View  Result Date: 05/27/2019 CLINICAL DATA:  Cellulitis. EXAM: CHEST  1 VIEW COMPARISON:  August 01, 2017. FINDINGS: The heart size and mediastinal contours are within normal limits. Both lungs are clear. Status post coronary bypass graft. The visualized skeletal structures are unremarkable. IMPRESSION: No active disease. Electronically Signed   By: Marijo Conception M.D.   On: 05/27/2019 11:34   MRI Right foot with and without contrast  Result Date: 06/11/2019 CLINICAL DATA:  Revascularization procedure and partial amputation through the 1st and 2nd metatarsals on 06/02/2019. Patient fell 3 days ago with  increasing right foot pain and discharge. EXAM: MRI OF THE RIGHT FOREFOOT WITHOUT AND WITH CONTRAST TECHNIQUE: Multiplanar, multisequence MR imaging of the right forefoot was performed before and after the administration of intravenous contrast. CONTRAST:  40mL GADAVIST GADOBUTROL 1 MMOL/ML IV SOLN COMPARISON:  Radiographs 06/11/2019 and 05/27/2019. FINDINGS: Despite efforts by the technologist and patient, mild motion artifact is present on today's exam and could not be eliminated. This reduces exam sensitivity and specificity. Bones/Joint/Cartilage Status post amputation through the mid shaft of the 1st metatarsal and through the distal shaft of the 2nd metatarsal. Low level enhancement along the distal margins of the remaining 1st and 2nd metatarsals is nonspecific in light of the recent surgery. No cortical destruction identified. The 3rd, 4th and 5th rays appear unremarkable. The tarsal bones appear unremarkable. Ligaments Intact Lisfranc ligament. Muscles and Tendons No focal intramuscular fluid collections or suspicious enhancement. No significant tenosynovitis. Soft tissues Irregular fluid collections are noted adjacent to the amputation sites with peripheral enhancement following contrast. These collections measure up to 3.1 cm on image 10/3. The collections extend dorsally, although no well-defined sinus tract to the skin is identified. There is no soft tissue emphysema. There is generalized subcutaneous edema throughout the forefoot. IMPRESSION: 1. Status post amputation through the mid shaft of the 1st metatarsal and through the distal shaft of the 2nd metatarsal. Low level enhancement along the distal margins of the 1st and 2nd metatarsals is nonspecific in light of the recent surgery. No cortical destruction or soft tissue emphysema identified. 2. Irregular fluid collections adjacent to the amputation sites are nonspecific, although could reflect abscesses, especially if there is drainage of purulent  fluid. No well-defined sinus tract identified. Electronically Signed   By: Richardean Sale M.D.   On: 06/11/2019 15:05   PERIPHERAL VASCULAR CATHETERIZATION  Result Date: 05/31/2019 Patient name: Jason [name] MRN: [id number] DOB: 04-27-1951 Sex: male 05/31/2019 Pre-operative Diagnosis: Critical right lower extremity ischemia with great toe ulceration Post-operative diagnosis:  Same Surgeon:  Erlene Quan C. Donzetta Matters, MD Procedure Performed: 1.  Ultrasound-guided cannulation left common femoral artery 2.  Ultrasound-guided cannulation right common femoral artery 3.  Aortogram with bilateral lower extremity runoff 4.  Moderate sedation with fentanyl and Versed for Indications: 68 year old male here with great toe ulceration on the right.  He has severely depressed ABIs bilaterally.  Has had previous intervention on the right common femoral artery after cardiac catheterization.  Has possibly  had greater saphenous vein harvested on the right as well. Findings: I cannot access from the left side because a dissection plane.  From the right side I was able to access.  The aorta and iliac segments are heavily diseased but there does appear to be nonflow-limiting stenosis to the both common femoral arteries.  On the right side which is the site of issue he possibly has a bypass to the profunda the SFA is flush occluded.  He reconstitutes above-knee popliteal.  Appears to have runoff via the anterior tibial and peroneal arteries.  On the left side he has a very diseased SFA.  Appears to occlude his tibioperoneal trunk and has runoff to the ankle via peroneal artery. We will consider patient for right common femoral to popliteal artery bypass.  He will be vein mapped for bilateral greater saphenous veins.  Procedure:  The patient was identified in the holding area and taken to room 8.  The patient was then placed supine on the table and prepped and draped in the usual sterile fashion.  A time out was called.  Ultrasound was used  to evaluate the left common femoral artery.  This was heavily diseased.  The cannulated 1 healthy area with micropuncture needle followed by wire and sheath.  Images saved the permanent record.  The wire would not pass I did use fluoroscopy to get it to pass further.  I placed a micropuncture sheath.  I then used a stiff angled Glidewire.  Unfortunately was in a subintimal plane.  I placed a 5 [place] sheath 10 disease.  Catheter to reenter but could not.  I then used ultrasound to identify the common femoral artery on the right.  This also appeared to be heavily diseased there was scar tissue was possibly a bypass in place.  I cannulated this with direct ultrasound visualization images saved the permanent record.  I did get a Bentson wire to pass.  I then performed retrograde angiography with hand-injection to confirm I was intraluminal in the aorta.  I then placed a Omni catheter to the level of L1 performed aortogram followed bilateral lower extremity runoff with the above findings.  Patient will be considered for right common femoral to popliteal artery bypass.  We will vein map him.  He will likely need toe amputation as well.  Catheter was removed over wire.  Sheath will be pulled in postoperative holding.  He tolerated procedure well without immediate complication. Contrast: 100cc Brandon C. Donzetta Matters, MD [facility] Office: [phone] Pager: [phone]   DG Foot Complete Right  Result Date: 06/11/2019 CLINICAL DATA:  Right foot pain. Surgery to remove 2 toes from right foot 06/02/2019. EXAM: RIGHT FOOT COMPLETE - 3+ VIEW COMPARISON:  Preoperative radiograph 05/27/2019 FINDINGS: Transmetatarsal resection of the first and second rays. Developing heterotopic calcification at the resection margin. Slight irregularity of the surgical margin of the second metatarsal. The remainder the foot is intact. No acute fracture. Small plantar calcaneal spur. Remote distal fibular fracture.  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 Slight developing heterotopic ossification at the operative bed. Resection margin of the second ray is minimally irregular, this may be normal postoperative healing or early osteomyelitis. Consider further evaluation with MRI based on clinical concern. Electronically Signed   By: Keith Rake M.D.   On: 06/11/2019 02:08   DG Foot Complete Right  Result Date: 05/27/2019 CLINICAL DATA:  Cellulitis.  Right foot swelling. EXAM: RIGHT FOOT COMPLETE - 3+  VIEW COMPARISON:  None. FINDINGS: There is no evidence of fracture or dislocation. There is no evidence of arthropathy or other focal bone abnormality. No lytic destruction is seen to suggest osteomyelitis. Soft tissues are unremarkable. IMPRESSION: Negative. Electronically Signed   By: Marijo Conception M.D.   On: 05/27/2019 11:36   VAS [place] ABI WITH/WO TBI  Result Date: 05/30/2019 LOWER EXTREMITY DOPPLER STUDY Indications: Ulceration, gangrene, and peripheral artery disease. High Risk Factors: Hypertension, hyperlipidemia, Diabetes, current smoker,                    coronary artery disease.  Vascular Interventions: Right ICA stent 03/28/15, CABG 2011. Comparison Study: No prior study on file for comparison Performing Technologist: Sharion Dove RVS  Examination Guidelines: A complete evaluation includes at minimum, Doppler waveform signals and systolic blood pressure reading at the level of bilateral brachial, anterior tibial, and posterior tibial arteries, when vessel segments are accessible. Bilateral testing is considered an integral part of a complete examination. Photoelectric Plethysmograph (PPG) waveforms and toe systolic pressure readings are included as required and additional duplex testing as needed. Limited examinations for reoccurring indications may be performed as noted.  ABI Findings: +---------+------------------+-----+-------------------+------------------+ Right    Rt  Pressure (mmHg)IndexWaveform           Comment            +---------+------------------+-----+-------------------+------------------+ Brachial 135                    triphasic                             +---------+------------------+-----+-------------------+------------------+ PTA      54                0.40 dampened monophasic                   +---------+------------------+-----+-------------------+------------------+ DP       40                0.30 monophasic                            +---------+------------------+-----+-------------------+------------------+ Great Toe                                          bandage/ulceration +---------+------------------+-----+-------------------+------------------+ +---------+------------------+-----+-------------------+-------+ Left     Lt Pressure (mmHg)IndexWaveform           Comment +---------+------------------+-----+-------------------+-------+ Brachial 129                    triphasic                  +---------+------------------+-----+-------------------+-------+ PTA      38                0.28 dampened monophasic        +---------+------------------+-----+-------------------+-------+ DP       33                0.24 dampened monophasic        +---------+------------------+-----+-------------------+-------+ Great Toe0                 0.00                    absent  +---------+------------------+-----+-------------------+-------+ +-------+-----------+-----------+------------+------------+  ABI/TBIToday's ABIToday's TBIPrevious ABIPrevious TBI +-------+-----------+-----------+------------+------------+ Right  0.4        wound                               +-------+-----------+-----------+------------+------------+ Left   0.2        absent                              +-------+-----------+-----------+------------+------------+  Summary: Right: Resting right ankle-brachial index indicates severe  right lower extremity arterial disease. Left: Resting left ankle-brachial index indicates critical left limb ischemia. The left toe-brachial index is abnormal.  *See table(s) above for measurements and observations.  Electronically signed by Servando Snare MD on 05/30/2019 at 5:01:14 PM.    Final    VAS [place] LOWER EXTREMITY SAPHENOUS VEIN MAPPING  Result Date: 06/01/2019 LOWER EXTREMITY VEIN MAPPING Indications:       ulceration Other Indications: Occluded right lower extremity bypass graft.  Comparison Study: No priors. Performing Technologist: Oda Cogan RDMS, RVT  Examination Guidelines: A complete evaluation includes B-mode imaging, spectral Doppler, color Doppler, and power Doppler as needed of all accessible portions of each vessel. Bilateral testing is considered an integral part of a complete examination. Limited examinations for reoccurring indications may be performed as noted. +--------------+----------------+-------------------+--------------+-----------+  RT Diameter    RT Findings           GSV         LT Diameter  LT Findings      (cm)                                             (cm)                 +--------------+----------------+-------------------+--------------+-----------+      0.51                       Saphenofemoral        0.58                                                    Junction                                +--------------+----------------+-------------------+--------------+-----------+      0.37                       Proximal thigh        0.59                 +--------------+----------------+-------------------+--------------+-----------+                not visualized      Mid thigh          0.37                                and harvested                                               +--------------+----------------+-------------------+--------------+-----------+  not visualized    Distal thigh         0.43                                 and harvested                                               +--------------+----------------+-------------------+--------------+-----------+                not visualized        Knee             0.29                 +--------------+----------------+-------------------+--------------+-----------+      0.26        branching         Prox calf          0.24      branching  +--------------+----------------+-------------------+--------------+-----------+      0.21        branching         Mid calf           0.26                 +--------------+----------------+-------------------+--------------+-----------+      0.24                         Distal calf         0.20                 +--------------+----------------+-------------------+--------------+-----------+      0.27                            Ankle            0.23                 +--------------+----------------+-------------------+--------------+-----------+ Diagnosing physician: Curt Jews MD Electronically signed by Curt Jews MD on 06/01/2019 at 8:44:51 PM.    Final    DG HIP UNILAT WITH PELVIS 2-3 VIEWS RIGHT  Result Date: 06/11/2019 CLINICAL DATA:  Fall yesterday.  Right hip pain. EXAM: DG HIP (WITH OR WITHOUT PELVIS) 2-3V RIGHT COMPARISON:  None. FINDINGS: The cortical margins of the bony pelvis and right hip are intact. No fracture. Pubic symphysis and sacroiliac joints are congruent. Both femoral heads are well-seated in the respective acetabula. Bones are under mineralized. Surgical hardware in the lower lumbar spine is partially included. There is mild soft tissue edema lateral to the right hip. IMPRESSION: 1. No acute fracture of the pelvis or right hip. 2. Lateral soft tissue edema. Electronically Signed   By: Keith Rake M.D.   On: 06/11/2019 02:05   VAS [place] LOWER EXTREMITY VENOUS (DVT)  Result Date: 06/04/2019  Lower Venous Study Indications: Pain, and Status post right femoral  -popliteal bypass graft using the GSV.  Comparison Study: No priors. Performing Technologist: Oda Cogan RDMS, RVT  Examination Guidelines: A complete evaluation includes B-mode imaging, spectral Doppler, color Doppler, and power Doppler as needed of all accessible portions of each vessel. Bilateral testing is considered an integral part of a complete examination. Limited examinations for reoccurring indications may be performed as noted.  +---------+---------------+---------+-----------+----------+--------------+ RIGHT  CompressibilityPhasicitySpontaneityPropertiesThrombus Aging +---------+---------------+---------+-----------+----------+--------------+ CFV      Full           Yes      Yes                                 +---------+---------------+---------+-----------+----------+--------------+ SFJ      Full                                                        +---------+---------------+---------+-----------+----------+--------------+ FV Prox  Full                                                        +---------+---------------+---------+-----------+----------+--------------+ FV Mid   Full                                                        +---------+---------------+---------+-----------+----------+--------------+ FV DistalFull                                                        +---------+---------------+---------+-----------+----------+--------------+ PFV      Full                                                        +---------+---------------+---------+-----------+----------+--------------+ POP      Full           Yes      Yes                                 +---------+---------------+---------+-----------+----------+--------------+ PTV      Full                                                        +---------+---------------+---------+-----------+----------+--------------+ PERO     Full                                                         +---------+---------------+---------+-----------+----------+--------------+   +----+---------------+---------+-----------+----------+--------------+ LEFTCompressibilityPhasicitySpontaneityPropertiesThrombus Aging +----+---------------+---------+-----------+----------+--------------+ CFV Full           Yes      Yes                                 +----+---------------+---------+-----------+----------+--------------+  SFJ Full                                                        +----+---------------+---------+-----------+----------+--------------+     Summary: Right: There is no evidence of deep vein thrombosis in the lower extremity. No cystic structure found in the popliteal fossa. Left: No evidence of common femoral vein obstruction.  *See table(s) above for measurements and observations. Electronically signed by Servando Snare MD on 06/04/2019 at 10:20:16 AM.    Final     Subjective:   Patient was seen and examined 06/14/2019, 11:24 AM Patient stable today. No acute distress.  No issues overnight Stable for discharge.  Discharge Exam:    Vitals:   06/13/19 1948 06/14/19 0310 06/14/19 0500 06/14/19 0826  BP: 115/70 (!) 116/57  (!) 111/48  Pulse: 70 69  70  Resp: 12 16    Temp: 97.8 F (36.6 C) (!) 97.4 F (36.3 C)  98.5 F (36.9 C)  TempSrc: Oral Oral  Oral  SpO2: 100% 100%  96%  Weight:   75.2 kg   Height:        General: Pt lying comfortably in bed & appears in no obvious distress. Cardiovascular: S1 & S2 heard, RRR, S1/S2 +. No murmurs, rubs, gallops or clicks. No JVD or pedal edema. Respiratory: Clear to auscultation without wheezing, rhonchi or crackles. No increased work of breathing. Abdominal:  Non-distended, non-tender & soft. No organomegaly or masses appreciated. Normal bowel sounds heard. CNS: Alert and oriented. No focal deficits. Extremities: no edema, no cyanosis --surgical wound -right foot, dressing in place, negative for  erythema edema or drainage -wound remains dry    The results of significant diagnostics from this hospitalization (including imaging, microbiology, ancillary and laboratory) are listed below for reference.      Microbiology:   Recent Results (from the past 240 hour(s))  SARS CORONAVIRUS 2 (TAT 6-24 HRS) Nasopharyngeal Nasopharyngeal Swab     Status: None   Collection Time: 06/06/19  1:14 PM   Specimen: Nasopharyngeal Swab  Result Value Ref Range Status   SARS Coronavirus 2 NEGATIVE NEGATIVE Final    Comment: (NOTE) SARS-CoV-2 target nucleic acids are NOT DETECTED. The SARS-CoV-2 RNA is generally detectable in upper and lower respiratory specimens during the acute phase of infection. Negative results do not preclude SARS-CoV-2 infection, do not rule out co-infections with other pathogens, and should not be used as the sole basis for treatment or other patient management decisions. Negative results must be combined with clinical observations, patient history, and epidemiological information. The expected result is Negative. Fact Sheet for Patients: SugarRoll.be Fact Sheet for Healthcare Providers: https://www.woods-mathews.com/ This test is not yet approved or cleared by the [place] FDA and  has been authorized for detection and/or diagnosis of SARS-CoV-2 by FDA under an Emergency Use Authorization (EUA). This EUA will remain  in effect (meaning this test can be used) for the duration of the COVID-19 declaration under Section 56 4(b)(1) of the Act, 21 U.S.C. section 360bbb-3(b)(1), unless the authorization is terminated or revoked sooner. Performed at [facility], [place] [address] [place], [place] [address]   Respiratory Panel by RT PCR (Flu A&B, Covid) - Nasopharyngeal Swab     Status: None   Collection Time: 06/11/19  2:30 AM   Specimen: Nasopharyngeal Swab  Result Value Ref Range Status   SARS Coronavirus 2 by RT PCR  NEGATIVE NEGATIVE Final    Comment: (NOTE) SARS-CoV-2 target nucleic acids are NOT DETECTED. The SARS-CoV-2 RNA is generally detectable in upper respiratoy specimens during the acute phase of infection. The lowest concentration of SARS-CoV-2 viral copies this assay can detect is 131 copies/mL. A negative result does not preclude SARS-Cov-2 infection and should not be used as the sole basis for treatment or other patient management decisions. A negative result may occur with  improper specimen collection/handling, submission of specimen other than nasopharyngeal swab, presence of viral mutation(s) within the areas targeted by this assay, and inadequate number of viral copies (<131 copies/mL). A negative result must be combined with clinical observations, patient history, and epidemiological information. The expected result is Negative. Fact Sheet for Patients:  PinkCheek.be Fact Sheet for Healthcare Providers:  GravelBags.it This test is not yet ap proved or cleared by the [place] FDA and  has been authorized for detection and/or diagnosis of SARS-CoV-2 by FDA under an Emergency Use Authorization (EUA). This EUA will remain  in effect (meaning this test can be used) for the duration of the COVID-19 declaration under Section 564(b)(1) of the Act, 21 U.S.C. section 360bbb-3(b)(1), unless the authorization is terminated or revoked sooner.    Influenza A by PCR NEGATIVE NEGATIVE Final   Influenza B by PCR NEGATIVE NEGATIVE Final    Comment: (NOTE) The Xpert Xpress SARS-CoV-2/FLU/RSV assay is intended as an aid in  the diagnosis of influenza from Nasopharyngeal swab specimens and  should not be used as a sole basis for treatment. Nasal washings and  aspirates are unacceptable for Xpert Xpress SARS-CoV-2/FLU/RSV  testing. Fact Sheet for Patients: PinkCheek.be Fact Sheet for Healthcare  Providers: GravelBags.it This test is not yet approved or cleared by the [place] FDA and  has been authorized for detection and/or diagnosis of SARS-CoV-2 by  FDA under an Emergency Use Authorization (EUA). This EUA will remain  in effect (meaning this test can be used) for the duration of the  Covid-19 declaration under Section 564(b)(1) of the Act, 21  U.S.C. section 360bbb-3(b)(1), unless the authorization is  terminated or revoked. Performed at [facility], [place] [address] [place], [place] [address]   Blood culture (routine x 2)     Status: None (Preliminary result)   Collection Time: 06/11/19  3:58 AM   Specimen: BLOOD RIGHT HAND  Result Value Ref Range Status   Specimen Description   Final    BLOOD RIGHT HAND Performed at [facility] [address], [place], [place] [address]    Special Requests   Final    BOTTLES DRAWN AEROBIC AND ANAEROBIC Blood Culture adequate volume Performed at [facility] [address], [place], [place] [address]    Culture   Final    NO GROWTH 3 DAYS Performed at [facility], [place] [address] [place], [place] [address]    Report Status PENDING  Incomplete  MRSA PCR Screening     Status: None   Collection Time: 06/11/19 12:19 PM   Specimen: Nasopharyngeal  Result Value Ref Range Status   MRSA by PCR NEGATIVE NEGATIVE Final    Comment:        The GeneXpert MRSA Assay (FDA approved for NASAL specimens only), is one component of a comprehensive MRSA colonization surveillance program. It is not intended to diagnose MRSA infection nor to guide or monitor treatment for MRSA  infections. Performed at [facility], [place] [address], [place], [place] [address]      Labs:   CBC: Recent Labs  Lab 06/11/19 0230 06/11/19 0243 06/12/19 0448  WBC 11.3*  --  8.3  NEUTROABS 7.8*  --  5.4  HGB 10.3* 10.5* 10.0*  HCT 31.9* 31.0* 30.3*  MCV  100.0  --  95.6  PLT 366  --  [id number]   Basic Metabolic Panel: Recent Labs  Lab 06/11/19 0243 06/12/19 0448  NA 131* 130*  K 4.9 4.6  CL 95* 95*  CO2  --  24  GLUCOSE 95 129*  BUN 14 14  CREATININE 1.20 1.18  CALCIUM  --  9.4  MG  --  1.7  PHOS  --  4.2   Liver Function Tests: Recent Labs  Lab 06/12/19 0448  AST 20  ALT 15  ALKPHOS 76  BILITOT 0.5  PROT 7.0  ALBUMIN 3.2*   BNP (last 3 results) No results for input(s): BNP in the last 8760 hours. Cardiac Enzymes: No results for input(s): CKTOTAL, CKMB, CKMBINDEX, TROPONINI in the last 168 hours. CBG: Recent Labs  Lab 06/13/19 0054 06/13/19 0659 06/13/19 1110 06/13/19 1712 06/14/19 0652  GLUCAP 97 95 151* 127* 103*  Urinalysis    Component Value Date/Time   COLORURINE YELLOW 07/08/2013 0226   APPEARANCEUR CLEAR 07/08/2013 0226   LABSPEC 1.006 07/08/2013 0226   PHURINE 6.0 07/08/2013 0226   GLUCOSEU NEGATIVE 07/08/2013 0226   HGBUR NEGATIVE 07/08/2013 0226   BILIRUBINUR NEGATIVE 07/08/2013 0226   KETONESUR NEGATIVE 07/08/2013 0226   PROTEINUR NEGATIVE 07/08/2013 0226   UROBILINOGEN 0.2 07/08/2013 0226   NITRITE NEGATIVE 07/08/2013 0226   LEUKOCYTESUR NEGATIVE 07/08/2013 0226         Time coordinating discharge: Over 45 minutes  SIGNED: Deatra James, MD, FACP, FHM. [facility],  Pager [phone]  If 7PM-7AM, please contact night-coverage [email or web address] 06/14/2019, 11:24 AM

## 2019-06-14 NOTE — NC FL2 (Addendum)
[place] LEVEL OF CARE SCREENING TOOL     IDENTIFICATION  Patient Name: Jason [name] Birthdate: Feb 22, 1951 Sex: male Admission Date (Current Location): 06/11/2019  [facility] and [place] Number:  Herbalist and Address:  [facility] [place], [place] [address], [place], [place] [address]      Provider Number: [id number]  Attending Physician Name and Address:  Deatra James, MD  Relative Name and Phone Number:  Nadara Mustard [phone]    Current Level of Care: Hospital Recommended Level of Care: [place] Prior Approval Number:    Date Approved/Denied:   PASRR Number: [id number]  Discharge Plan: SNF    Current Diagnoses: Patient Active Problem List   Diagnosis Date Noted  . Nicotine abuse 06/12/2019  . Cellulitis of right foot 06/11/2019  . Diabetic foot infection ([place]) 06/11/2019  . PAD (peripheral artery disease) ([place]) 06/11/2019  . Cellulitis 05/27/2019  . Gangrene of toe ([place]) 05/27/2019  . Status post lumbar spinal fusion 10/27/2017  . Acute on chronic combined systolic and diastolic CHF (congestive heart failure) ([place]) 10/25/2016  . NSTEMI (non-ST elevated myocardial infarction) ([place]) 10/22/2016  . Hypothyroidism 08/17/2015  . RBBB 05/11/2015  . Hyponatremia 04/04/2015  . Hypotension 04/04/2015  . Hemispheric carotid artery syndrome   . Carotid artery narrowing 03/28/2015  . Carotid stenosis- moderate 2011, 95% 2016 s/p stent 02/21/2014  . Unstable angina ([place]) 01/16/2014  . Tobacco abuse 01/16/2014  . Substance abuse in remission ([place]) 10/01/2012  . Radiculitis 02/10/2012  . CAD, CABG Feb 2011, cath x 4 since-medical Rx 10/03/2011  . PVD, hx Rt femoral endarterectomy 2004 10/03/2011  . DM (diabetes mellitus) ([place]) 10/02/2011  . HTN (hypertension) 10/02/2011  . Hyperlipidemia 10/02/2011    Orientation RESPIRATION BLADDER Height & Weight     Self, Time, Situation, Place  Normal Continent Weight:  75.2 kg Height:  5\' 9"  (175.3 cm)  BEHAVIORAL SYMPTOMS/MOOD NEUROLOGICAL BOWEL NUTRITION STATUS  Other (Comment)(N/A)   Continent Diet(see discharge summary)  AMBULATORY STATUS COMMUNICATION OF NEEDS Skin   Limited Assist Verbally Surgical wounds(Hydrocolloid;Compression wrap; melepex and kerlex to right foot)                       Personal Care Assistance Level of Assistance  Bathing, Feeding, Dressing Bathing Assistance: Limited assistance Feeding assistance: Independent Dressing Assistance: Limited assistance     Functional Limitations Info  Sight, Hearing, Speech Sight Info: Impaired Hearing Info: Impaired Speech Info: Adequate    SPECIAL CARE FACTORS FREQUENCY  PT (By licensed PT), OT (By licensed OT)     PT Frequency: Min 2X/week              Contractures      Additional Factors Info  Code Status, Allergies, Insulin Sliding Scale, Psychotropic Code Status Info: Full Code Allergies Info: SULFA ANTIBIOTICS,COREG Psychotropic Info: Depression-Zoloft Insulin Sliding Scale Info: 3 times daily with meals       Current Medications (06/14/2019):  This is the current hospital active medication list Current Facility-Administered Medications  Medication Dose Route Frequency Provider Last Rate Last Admin  . acetaminophen (TYLENOL) tablet 650 mg  650 mg Oral Q6H PRN Raiford Noble [place], DO   650 mg at 06/13/19 1254   Or  . acetaminophen (TYLENOL) suppository 650 mg  650 mg Rectal Q6H PRN Raiford Noble Latif, DO      . aspirin EC tablet 81 mg  81 mg Oral Daily [address], Georgina Quint [place],  DO   81 mg at 06/14/19 0856  . bisacodyl (DULCOLAX) suppository 10 mg  10 mg Rectal Daily PRN Raiford Noble Latif, DO      . cefTRIAXone (ROCEPHIN) 2 g in sodium chloride 0.9 % 100 mL IVPB  2 g Intravenous Q24H Sheikh, Georgina Quint [place], DO 200 mL/hr at 06/14/19 1337 2 g at 06/14/19 1337  . clopidogrel (PLAVIX) tablet 75 mg  75 mg Oral Daily Raiford Noble [place], DO   75 mg at 06/14/19 [id number]  .  enoxaparin (LOVENOX) injection 40 mg  40 mg Subcutaneous Q24H Raiford Noble [place], DO   40 mg at 06/14/19 [id number]  . feeding supplement (ENSURE ENLIVE) (ENSURE ENLIVE) liquid 237 mL  237 mL Oral BID BM Sheikh, Omair Latif, DO   237 mL at 06/14/19 1329  . insulin aspart (novoLOG) injection 0-5 Units  0-5 Units Subcutaneous QHS Sheikh, Omair Latif, DO      . insulin aspart (novoLOG) injection 0-9 Units  0-9 Units Subcutaneous TID WC Raiford Noble [place], DO   2 Units at 06/14/19 1328  . levothyroxine (SYNTHROID) tablet 50 mcg  50 mcg Oral QAC breakfast Raiford Noble [place], DO   50 mcg at 06/14/19 [id number]  . loratadine (CLARITIN) tablet 10 mg  10 mg Oral Daily Raiford Noble [place], DO   10 mg at 06/14/19 [id number]  . metoprolol succinate (TOPROL-XL) 24 hr tablet 25 mg  25 mg Oral Daily Raiford Noble [place], DO   25 mg at 06/13/19 0840  . metroNIDAZOLE (FLAGYL) tablet 500 mg  500 mg Oral Q8H Shahmehdi, Seyed A, MD   500 mg at 06/14/19 1208  . multivitamin (PROSIGHT) tablet 1 tablet  1 tablet Oral Daily Raiford Noble [place], [place]   1 tablet at 06/14/19 [id number]  . nicotine (NICODERM CQ - dosed in mg/24 hours) patch 21 mg  21 mg Transdermal Daily Debbe [place]de[place], MD   21 mg at 06/14/19 0852  . nitroGLYCERIN (NITROSTAT) SL tablet 0.4 mg  0.4 mg Sublingual Q5 min PRN Sheikh, Omair Latif, DO      . nutrition supplement (JUVEN) (JUVEN) powder packet 1 packet  1 packet Oral BID BM Sheikh, Omair Latif, DO      . ondansetron [facility]) tablet 4 mg  4 mg Oral Q6H PRN Raiford Noble Latif, DO       Or  . ondansetron [facility]) injection 4 mg  4 mg Intravenous Q6H PRN Sheikh, Omair Latif, DO      . oxyCODONE-acetaminophen (PERCOCET/ROXICET) 5-325 MG per tablet 1 tablet  1 tablet Oral Q6H PRN Raiford Noble [place], DO   1 tablet at 06/14/19 1208  . polyethylene glycol (MIRALAX / GLYCOLAX) packet 17 g  17 g Oral Daily PRN Raiford Noble Latif, DO      . ranolazine (RANEXA) 12 hr tablet 1,000 mg  1,000 mg Oral BID Raiford Noble [place], DO   1,000 mg  at 06/14/19 [id number]  . rosuvastatin (CRESTOR) tablet 10 mg  10 mg Oral Daily Raiford Noble [place], DO   10 mg at 06/14/19 [id number]  . sertraline (ZOLOFT) tablet 250 mg  250 mg Oral Daily Raiford Noble [place], DO   250 mg at 06/14/19 [id number]     Discharge Medications: Please see discharge summary for a list of discharge medications.  Relevant Imaging Results:  Relevant Lab Results:   Additional Information SS# [id number]  Midge Minium MSN, RN, NCM-BC, ACM-RN [phone]

## 2019-06-15 DIAGNOSIS — L03115 Cellulitis of right lower limb: Secondary | ICD-10-CM

## 2019-06-15 LAB — GLUCOSE, CAPILLARY
Glucose-Capillary: 80 mg/dL (ref 70–99)
Glucose-Capillary: 118 mg/dL — ABNORMAL HIGH (ref 70–99)
Glucose-Capillary: 139 mg/dL — ABNORMAL HIGH (ref 70–99)
Glucose-Capillary: 134 mg/dL — ABNORMAL HIGH (ref 70–99)

## 2019-06-15 MED ORDER — OXYCODONE-ACETAMINOPHEN 5-325 MG PO TABS
1.0000 | ORAL_TABLET | Freq: Once | ORAL | Status: AC
  Administered 2019-06-15: 1 via ORAL

## 2019-06-15 NOTE — Progress Notes (Signed)
   06/15/19 1640  SNF Authorization Status  SNF Authorization Complete 06/15/19  SNF Auth Complete Time 1640  SNF Authorization Status Complete   [employer] ref#: [id number]  Midge Minium MSN, RN, NCM-BC, ACM-RN [phone]

## 2019-06-15 NOTE — Progress Notes (Signed)
   06/15/19 1118  SNF Authorization Status  SNF Authorization Type Transition of Care CM/SW Authorization Request  SNF Auth Started 06/15/19  SNF Auth Start Time 1118  SNF Authorization Status Started   CM faxed the requested documentation to Bernadene Bell to initiate insurance approval for [employer].   Midge Minium MSN, RN, NCM-BC, ACM-RN [phone]

## 2019-06-15 NOTE — Progress Notes (Signed)
Nutrition Follow-up  DOCUMENTATION CODES:   Not applicable  INTERVENTION:   -Discontinue Juven -Discontinue Prostat -Continue MVI with minerals daily -Double protein portions with meals  NUTRITION DIAGNOSIS:   Increased nutrient needs related to wound healing(mild cellulitis of right foot s/p right firtst and second toe amputation) as evidenced by estimated needs.  Ongoing  GOAL:   Patient will meet greater than or equal to 90% of their needs  Progressing   MONITOR:   PO intake, Supplement acceptance, Weight trends, I & O's, Skin, Labs  REASON FOR ASSESSMENT:   Consult Wound healing  ASSESSMENT:   68 year old male with medical history significant of depression and anxiety, bilateral CAD, h/o basal cell cancer, COPD, h/o multiple MI's with CABG, h/o unstable angina, T2DM, GERD, HTN, HLD, right bundle branch block, h/o substance abuse, PVD, recent amputation of right first 2 toes 12/10 with revascularization procedure. Patient discharged to rehab facility where he subsequently experienced a fall and presented to ED for evaluation of right hip pain and worsening foot pain. In ED, pt noted with some discharge and crusting at suture line with redness and warmth of right foot extending to right shin. XR obtained and discussed with vascular surgery who recommended admission for cellulitis of right foot with concern for osteomyelitis in pt with recent right toe amputations.  12/10- s/p s/p right iliofemoral endarterectomy with patch angioplasty and right femoral to below-knee popliteal bypass graft and amputation of right first and second toe.  Reviewed I/O's: -870 ml x 24 hours and -1.2 L since admission  UOP: 1.4 L x 24 hours  Pt with good appetite. Noted meal completion 75-100%. He is refusing Juven and Ensure supplements.  Per RNCM notes, plan to discharge to SNF once insurance authorization has been obtained.   Labs reviewed: CBGS: 80-115 (inpatient orders for glycemic  control are 0-5 units insulin aspart q HS and 0-9 units insulin aspart TID with meals).   Diet Order:   Diet Order            Diet - low sodium heart healthy        Diet heart healthy/carb modified Room service appropriate? Yes; Fluid consistency: Thin  Diet effective now              EDUCATION NEEDS:   No education needs have been identified at this time  Skin:  Skin Assessment: Reviewed RN Assessment(incision;closed; right foot, right groin, right leg)  Last BM:  06/14/19  Height:   Ht Readings from Last 1 Encounters:  06/11/19 5\' 9"  (1.753 m)    Weight:   Wt Readings from Last 1 Encounters:  06/15/19 74.5 kg    Ideal Body Weight:  72.7 kg  BMI:  Body mass index is 24.25 kg/m.  Estimated Nutritional Needs:   Kcal:  2000-2200  Protein:  110-120  Fluid:  >/= 2 L/day    Oddie Bottger A. Jimmye Norman, RD, LDN, [place] Registered Dietitian II Certified Diabetes Care and Education Specialist Pager: [phone] After hours Pager: [phone]

## 2019-06-15 NOTE — Evaluation (Signed)
Occupational Therapy Evaluation Patient Details Name: Jason [name] MRN: [id number] DOB: Jun 15, 1951 Today's Date: 06/15/2019    History of Present Illness Pt is a 68 y.o. male recently s/p R femoral popliteal bypass graft and first and second toe amputation on 06/02/19 and d/c to SNF, now readmitted 06/11/19 after fall with c/o hip pain. No acute fx to R hip/pelvis. Found to have R foot cellulitis. Other PMH includes HTN, MI, DM, CVA, CAD, COPD, arthritis, depression.   Clinical Impression   Pt with decline in function and safety with ADLs and ADL mobility with impaired balance and endurance. Pt lives at home alone and was independent with ADLs/selfcare, mobility. Home mgt and was driving. Pt recently at [facility] for [place] rehab and plans to return there when medically ready. Pt currently requires min A with LB ADLs, mod - min A with mobility using RW and min guard A with toileting. Pt would benefit from acute OT services to address impairments to maximize level of function and safety    Follow Up Recommendations  SNF;Supervision/Assistance - 24 hour    Equipment Recommendations  Tub/shower seat;Other (comment)(reacher)    Recommendations for Other Services       Precautions / Restrictions Precautions Precautions: Fall Restrictions Weight Bearing Restrictions: Yes Other Position/Activity Restrictions: WB thru heel w/ darco shoe      Mobility Bed Mobility Overal bed mobility: Needs Assistance Bed Mobility: Sit to Supine     Supine to sit: Supervision;HOB elevated        Transfers Overall transfer level: Needs assistance Equipment used: Rolling walker (2 wheeled) Transfers: Sit to/from Stand Sit to Stand: Mod assist;Min assist         General transfer comment: Initial mod A for trunk elevation standing from EOB to RW    Balance Overall balance assessment: Needs assistance Sitting-balance support: No upper extremity supported;Feet supported Sitting  balance-Leahy Scale: Good     Standing balance support: Bilateral upper extremity supported;During functional activity Standing balance-Leahy Scale: Poor                             ADL either performed or assessed with clinical judgement   ADL Overall ADL's : Needs assistance/impaired Eating/Feeding: Independent;Sitting   Grooming: Set up;Oral care;Wash/dry face;Sitting   Upper Body Bathing: Set up;Sitting   Lower Body Bathing: Minimal assistance;Sitting/lateral leans;Sit to/from stand   Upper Body Dressing : Set up;Sitting   Lower Body Dressing: Supervision/safety;Sitting/lateral leans;Minimal assistance   Toilet Transfer: Moderate assistance;Minimal assistance;Ambulation;RW;Stand-pivot;Cueing for safety;Cueing for sequencing   Toileting- Clothing Manipulation and Hygiene: Min guard;Sitting/lateral lean;Sit to/from stand       Functional mobility during ADLs: Moderate assistance;Minimal assistance;Cueing for safety;Cueing for sequencing;Rolling walker       Vision Patient Visual Report: No change from baseline       Perception     Praxis      Pertinent Vitals/Pain Pain Assessment: 0-10 Pain Score: 6  Pain Location: RLE Pain Descriptors / Indicators: Burning;Shooting Pain Intervention(s): Monitored during session;Repositioned     Hand Dominance Right   Extremity/Trunk Assessment Upper Extremity Assessment Upper Extremity Assessment: Overall WFL for tasks assessed   Lower Extremity Assessment Lower Extremity Assessment: Defer to PT evaluation   Cervical / Trunk Assessment Cervical / Trunk Assessment: Normal   Communication Communication Communication: No difficulties   Cognition Arousal/Alertness: Awake/alert Behavior During Therapy: WFL for tasks assessed/performed Overall Cognitive Status: No family/caregiver present to determine baseline cognitive functioning Area  of Impairment: Safety/judgement                          Safety/Judgement: Decreased awareness of safety;Decreased awareness of deficits         General Comments       Exercises     Shoulder Instructions      Home Living Family/patient expects to be discharged to:: Skilled nursing facility Living Arrangements: Alone Available Help at Discharge: Family;Available PRN/intermittently Type of Home: House Home Access: Stairs to enter [employer] of Steps: 6 Entrance Stairs-Rails: Right;Left Home Layout: One level     Bathroom Shower/Tub: Teacher, early years/pre: Handicapped height     Home Equipment: None   Additional Comments: Initially home alone, recent d/c to [facility]F for rehab.      Prior Functioning/Environment Level of Independence: Needs assistance        Comments: Was independent prior to initial admission 05/2019; has been at [facility] for rehab        OT Problem List: Decreased activity tolerance;Impaired balance (sitting and/or standing);Decreased safety awareness;Decreased knowledge of use of DME or AE;Decreased knowledge of precautions;Pain      OT Treatment/Interventions: Self-care/ADL training;DME and/or AE instruction;Therapeutic activities;Patient/family education;Balance training;Therapeutic exercise    OT Goals(Current goals can be found in the care plan section) Acute Rehab OT Goals Patient Stated Goal: Agreeable for return to SNF for continued rehab OT Goal Formulation: With patient Time For Goal Achievement: 06/29/19 ADL Goals Pt Will Perform Grooming: with min guard assist;standing Pt Will Perform Lower Body Bathing: with min guard assist;sitting/lateral leans;sit to/from stand Pt Will Perform Lower Body Dressing: with min guard assist;sitting/lateral leans;sit to/from stand Pt Will Transfer to Toilet: with min assist;with min guard assist;ambulating;stand pivot transfer Pt Will Perform Toileting - Clothing Manipulation and hygiene: with min guard assist;with supervision;sit  to/from stand  OT Frequency: Min 2X/week   Barriers to D/C: Decreased caregiver support          Co-evaluation              AM-PAC OT "6 Clicks" Daily Activity     Outcome Measure Help from another person eating meals?: None Help from another person taking care of personal grooming?: A Little Help from another person toileting, which includes using toliet, bedpan, or urinal?: A Little Help from another person bathing (including washing, rinsing, drying)?: A Little Help from another person to put on and taking off regular upper body clothing?: A Little Help from another person to put on and taking off regular lower body clothing?: A Little 6 Click Score: 19   End of Session Equipment Utilized During Treatment: Rolling walker;Gait belt  Activity Tolerance: Patient tolerated treatment well Patient left: in chair;with call bell/phone within reach  OT Visit Diagnosis: Unsteadiness on feet (R26.81);Other abnormalities of gait and mobility (R26.89);Pain Pain - Right/Left: Right Pain - part of body: Leg;Ankle and joints of foot                Time: [id number] OT Time Calculation (min): 26 min Charges:  OT General Charges $OT Visit: 1 Visit OT Evaluation $OT Eval Moderate Complexity: 1 Mod OT Treatments $Self Care/Home Management : 8-22 mins    Emmit [facility] 06/15/2019, 11:00 AM

## 2019-06-15 NOTE — Care Management Important Message (Signed)
Important Message  Patient Details  Name: Jason [name] MRN: [id number] Date of Birth: [birth year]   Medicare Important Message Given:  Yes     Grove Def[address] 06/15/2019, 2:50 PM

## 2019-06-15 NOTE — Plan of Care (Signed)
  Problem: Safety: Goal: Ability to remain free from injury will improve 06/15/2019 2313 by Josepha Pigg, RN Outcome: Progressing 06/15/2019 2313 by Josepha Pigg, RN Outcome: Progressing   Problem: Skin Integrity: Goal: Risk for impaired skin integrity will decrease 06/15/2019 2313 by Josepha Pigg, RN Outcome: Progressing 06/15/2019 2313 by Josepha Pigg, RN Outcome: Progressing   Problem: Pain Managment: Goal: General experience of comfort will improve 06/15/2019 2313 by Josepha Pigg, RN Outcome: Progressing 06/15/2019 2313 by Josepha Pigg, RN Outcome: Progressing

## 2019-06-15 NOTE — Progress Notes (Signed)
Awaiting insurance authorization, no acute overnight event.  Patient is observed ambulated to the bathroom .

## 2019-06-16 ENCOUNTER — Non-Acute Institutional Stay (SKILLED_NURSING_FACILITY): Payer: Medicare Other | Admitting: Internal Medicine

## 2019-06-16 DIAGNOSIS — E1151 Type 2 diabetes mellitus with diabetic peripheral angiopathy without gangrene: Secondary | ICD-10-CM | POA: Diagnosis not present

## 2019-06-16 DIAGNOSIS — E034 Atrophy of thyroid (acquired): Secondary | ICD-10-CM | POA: Diagnosis not present

## 2019-06-16 DIAGNOSIS — I25118 Atherosclerotic heart disease of native coronary artery with other forms of angina pectoris: Secondary | ICD-10-CM | POA: Diagnosis not present

## 2019-06-16 DIAGNOSIS — L03115 Cellulitis of right lower limb: Secondary | ICD-10-CM | POA: Diagnosis not present

## 2019-06-16 LAB — GLUCOSE, CAPILLARY: Glucose-Capillary: 114 mg/dL — ABNORMAL HIGH (ref 70–99)

## 2019-06-16 LAB — CULTURE, BLOOD (ROUTINE X 2)
Culture: NO GROWTH
Special Requests: ADEQUATE

## 2019-06-16 MED ORDER — OXYCODONE-ACETAMINOPHEN 5-325 MG PO TABS: 1.0000 | ORAL_TABLET | Freq: Four times a day (QID) | ORAL | 0 refills | Status: DC | PRN

## 2019-06-16 NOTE — TOC Transition Note (Addendum)
Transition of Care [facility]) - CM/SW Discharge Note   Patient Details  Name: Jason [name] MRN: [id number] Date of Birth: 1950-11-28  Transition of Care [facility] CM/SW Contact:  Midge Minium MSN, RN, NCM-BC, ACM-RN [phone] Phone Number: 06/16/2019, 10:09 AM   Clinical Narrative:    Patient medically stable to transition back to [facility] for [place] rehab. DC paperwork faxed to the facility. CM updated the patient, with the patient agreeable to the DCP. PTAR arranged for 1100.  Please call report to: [phone]; room 515   Final next level of care: Skilled Nursing Facility Barriers to Discharge: No Barriers Identified  Discharge Placement              Patient chooses bed at: [facility] Patient to be transferred to facility by: [place] Name of family member notified: Patient Patient and family notified of of transfer: 06/16/19   Social Determinants of Health (SDOH) Interventions     Readmission Risk Interventions No flowsheet data found.

## 2019-06-16 NOTE — Plan of Care (Signed)

## 2019-06-16 NOTE — Progress Notes (Signed)
This is an acute visit.  Level of care is skilled.  [facility]  Chief complaint is acute visit status post readmission from hospital with history of cellulitis right lower extremity.  History of present illness.  Patient is a very pleasant 68 year old male seen today for readmission after hospitalization for what was diagnosed as right lower extremity cellulitis.  It appears he was actually sent to the hospital for concerns of a possible tibial fracture-however on assessment at hospital apparently there was thought his the issue was more cellulitis of his right lower extremity. On previous hospitalization he had had a right femoral-popliteal bypass with graft and first and second toe amputations.  During hospitalization for cellulitis he was treated with Rocephin and Flagyl-and followed closely by wound care.  Blood cultures were negative.  He has been switched to p.o. clindamycin for a 10-day course.  His other diagnoses include type 2 diabetes as well as coronary artery disease hypothyroidism and depression.  Currently has no complaints continues to be in good spirits his foot is wrapped.  He continues on aspirin Plavix Ranexa Lopressor and Crestor with a history of coronary artery disease he is not complaining of any chest pain.  He continues on Synthroid for history of hypothyroidism I do note in hospital TSH was mildly elevated at 6.423.  For diabetes he continues on Janumet-it appears blood sugars were largely in the 100s range in the hospital  Past Medical History:  Diagnosis Date  . Anxiety    off xanax  and paxil since 3/13  . Arthritis   . Bilateral carotid artery disease ([place])    s/p R ICA stent 03/28/2015 with distal protection. Known chronically occluded L ICA. Carotid stent complicated by hypotension and acute stroke in [place]  . Cancer ([place])    tumor basal cell rem from lft arm  . Cataract   . COPD (chronic obstructive  pulmonary disease) ([place])   . Coronary artery disease    a. s/p CABG in 2011 with LIMA-LAD, SVG-RI/OM, and SVG-PDA b. occluded SVG-PDA by cath in 2015 c. 10/2016: NSTEMI with cath showing thrombus along the distal graft to insertion of SVG-OM2 with DES placed.   . CVA (cerebral infarction)    occured on 10/10 several days after R ICA carotid stenting  . Depression   . Diabetes mellitus   . GERD (gastroesophageal reflux disease)   . Heart attack ([place])   . High cholesterol   . Hypertension    type 2 NIDDM  . Myocardial infarct (H[facility])    x4 last 10 yrs  . Pneumonia    hx  . 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 Smoker   . Substance abuse [facility])    Past Surgical History:  Procedure Laterality Date  . ABDOMINAL AORTOGRAM W/LOWER EXTREMITY Bilateral 05/31/2019   Procedure: ABDOMINAL AORTOGRAM W/LOWER EXTREMITY;  Surgeon: Waynetta Sandy, MD;  Location: [place] CV LAB;  Service: Cardiovascular;  Laterality: Bilateral;  . AMPUTATION TOE Right 06/02/2019   Procedure: Amputation Right Great Toe and Second Toe;  Surgeon: Waynetta Sandy, MD;  Location: [place];  Service: Vascular;  Laterality: Right;  . BACK SURGERY  Jan 2014, Dec 2011   Dr Vertell Limber  . CARDIAC CATHETERIZATION  4/12   Medical Rx  . CORONARY ANGIOGRAM  01/17/14   med rx  . CORONARY ANGIOGRAM  4/13   med Rx  . CORONARY ANGIOGRAM  1/15  Med Rx  . CORONARY ANGIOPLASTY  Jan 2004   RCA  . CORONARY ARTERY BYPASS GRAFT  07/24/2009   L-LAD, SVG-RI/OM, SVG-PDA  . CORONARY STENT INTERVENTION N/A 10/23/2016   Procedure: Coronary Stent Intervention;  Surgeon: Peter M Martinique, MD;  Location: [place] CV LAB;  Service: Cardiovascular;  Laterality: N/A;  . ENDARTERECTOMY FEMORAL Right 06/02/2019   Procedure: Endarterectomy External Iliac  Femoral Artery and Profunda;  Surgeon: Waynetta Sandy, MD;  Location: [place];  Service: Vascular;   Laterality: Right;  . EYE SURGERY     cat bil  . FEMORAL ARTERY - FEMORAL ARTERY BYPASS GRAFT Right 2004   femoral enarterectomy  . FEMORAL-POPLITEAL BYPASS GRAFT Right 06/02/2019   Procedure: RIGHT LEG BYPASS GRAFT FEMORAL-POPLITEAL ARTERY using Gore Propaten Vascular Graft Removable Ring;  Surgeon: Waynetta Sandy, MD;  Location: [place];  Service: Vascular;  Laterality: Right;  . LEFT HEART CATH AND CORS/GRAFTS ANGIOGRAPHY N/A 10/23/2016   Procedure: Left Heart Cath and Cors/Grafts Angiography;  Surgeon: Peter M Martinique, MD;  Location: [place] CV LAB;  Service: Cardiovascular;  Laterality: N/A;  . LEFT HEART CATHETERIZATION WITH CORONARY ANGIOGRAM N/A 10/03/2011   Procedure: LEFT HEART CATHETERIZATION WITH CORONARY ANGIOGRAM;  Surgeon: Pixie Casino, MD;  Location: [facility] CATH LAB;  Service: Cardiovascular;  Laterality: N/A;  . LEFT HEART CATHETERIZATION WITH CORONARY ANGIOGRAM N/A 07/08/2013   Procedure: LEFT HEART CATHETERIZATION WITH CORONARY ANGIOGRAM;  Surgeon: Blane Ohara, MD;  Location[facility] LAB;  Service: Cardiovascular;  Laterality: N/A;  . LEFT HEART CATHETERIZATION WITH CORONARY/GRAFT ANGIOGRAM N/A 01/17/2014   Procedure: LEFT HEART CATHETERIZATION WITH Beatrix Fetters;  Surgeon: Troy Sine, MD;  Location: [facility] LAB;  Service: Cardiovascular;  Laterality: N/A;  . PATCH ANGIOPLASTY Right 06/02/2019   Procedure: Patch Angioplasty using Hemashield Platinum Finesse Patch of the External Iliac Femoral Artery and Profunda;  Surgeon: Waynetta Sandy, MD;  Location: [place];  Service: Vascular;  Laterality: Right;  . PERIPHERAL VASCULAR CATHETERIZATION N/A 03/28/2015   Procedure: Carotid PTA/Stent Intervention;  Surgeon: Lorretta Harp, MD;  Location: [place] CV LAB;  Service: Cardiovascular;  Laterality: N/A;  . US EXTREMITY*L*     lft arm tumor removed    SOCIAL HISTORY  reports that he has been smoking cigarettes. He has a 48.00  pack-year smoking history. He has never used smokeless tobacco. He reports current alcohol use. He reports that he does not use drugs.       Allergies  Allergen Reactions  . Coreg [Carvedilol] Nausea And Vomiting and Other (See Comments)    Per patient made him dizzy and light sensitive  . Sulfa Antibiotics Nausea And Vomiting and Other (See Comments)    Also headaches         Family History  Problem Relation Age of Onset  . Arthritis Mother   . Heart disease       MEDICATIONS    acetaminophen 325 MG tablet Commonly known as: TYLENOL Take 650 mg by mouth every 4 (four) hours as needed for mild pain or headache.   aspirin 81 MG tablet Take 1 tablet (81 mg total) by mouth daily.   clindamycin 300 MG capsule Commonly known as: CLEOCIN Take 1 capsule (300 mg total) by mouth 3 (three) times daily for 10 days.   clopidogrel 75 MG tablet Commonly known as: PLAVIX Take 1 tablet (75 mg total) by mouth daily.   Janumet 50-500 MG tablet Generic drug: sitaGLIPtin-metformin Take 1 tablet  by mouth 2 (two) times daily with a meal.   lactobacillus acidophilus & bulgar chewable tablet Chew 1 tablet by mouth 3 (three) times daily with meals for 12 days.   levothyroxine 50 MCG tablet Commonly known as: SYNTHROID TAKE 1 TABLET BY MOUTH EVERY DAY What changed: when to take this   loratadine 10 MG tablet Commonly known as: CLARITIN Take 10 mg by mouth daily.   metoprolol succinate 25 MG 24 hr tablet Commonly known as: TOPROL-XL Take 1 tablet (25 mg total) by mouth daily.   nitroGLYCERIN 0.4 MG SL tablet Commonly known as: NITROSTAT PLACE 1 TABLET (0.4 MG TOTAL) UNDER THE TONGUE EVERY 5 (FIVE) MINUTES AS NEEDED FOR CHEST PAIN.   oxyCODONE-acetaminophen 5-325 MG tablet Commonly known as: PERCOCET/ROXICET Take 1 tablet by mouth every 6 (six) hours as needed for up to 4 days for severe pain.   ranolazine 1000 MG SR tablet Commonly known as: RANEXA TAKE 1  TABLET BY MOUTH 2 TIMES DAILY. NEED OV. What changed:   how much to take  how to take this  when to take this   rosuvastatin 10 MG tablet Commonly known as: CRESTOR Take 10 mg by mouth daily.   sertraline 100 MG tablet Commonly known as: ZOLOFT Take 2 and 1/2 tablets by mouth daily      Review of systems.  In general he is not complaining of any fever or chills.  Skin does not complain of rashes or itching is status post toe amputations as noted above and recently treated for cellulitis.  Head ears eyes nose mouth and throat is not complain of visual changes or sore throat.  Respiratory denies any increased shortness of breath or cough.  Cardiac is not complaining of chest pain or palpitations.  GI does not complain of abdominal discomfort nausea vomiting diarrhea constipation.  GU no complaints of dysuria.  Musculoskeletal is status post right first and second toe amputations recently does not complain of pain at this time he says the oxycodone does help.  Neurologic does not complain of dizziness headache syncope.  And psych has a listed history of depression but does not complain of being depressed or anxious at this time   Physical exam.  He is afebrile pulse of 72 respirations 18 blood pressure is pending.  In general this is a very pleasant male in no distress sitting comfortably in his chair.  His skin is warm and dry his right foot is currently wrapped-I do not see any erythema of the area of his right lower leg that is exposed.  Eyes visual acuity appears to be intact sclera and conjunctive are clear.  Oropharynx is clear mucous membranes moist.  He does have numerous extractions.  Chest is clear to auscultation there is no labored breathing.  Heart is regular rate and rhythm without murmur gallop or rub he does not appear to have significant edema I would say trace edema somewhat difficult to fully assess on the right since foot is  wrapped.  Abdomen is soft nontender with positive bowel sounds.  Musculoskeletal again his right foot is wrapped does not appear able to move his extremities x4.  Neurologic appears grossly intact without lateralizing findings his speech is clear.  Psych he is alert and oriented very pleasant and appropriate.  Labs.  June 12, 2019.  Sodium 130 potassium 4.6 BUN 14 creatinine 1.18.  Albumin 3.2 otherwise liver function tests within normal limits.  WBC 8.3 hemoglobin 10.0 platelets 355.  June 11, 2019.  TSH was 6.423   Assessment and plan.  1.  Right foot cellulitis-he was treated with IV antibiotics including Rocephin and Flagyl in the hospital he has been transitioned to clindamycin for 10-day course at this point will monitor he does have Percocet every 6 hours as needed for pain.  2.  History of right femoral-popliteal bypass graft with right second and first toe amputations-again at this point he will need wound care follow-up--as well as continued PT and OT.  3.  History of diabetes last hemoglobin A1c when seen earlier December was 8.5 he is on Janumet 50-500 appears his blood sugars were largely in the 100s in the hospital at this point will monitor CBGs and notify provider of any sugar less than 60 or greater than 300.  4.  History of coronary artery disease at this point no complaints of chest pain he is on Plavix 75 mg a day Ranexa 1000 mg twice daily and aspirin 81 mg a day in addition to Toprol-XL 25 mg a day he also continues on Crestor.  5.  History of hypothyroidism he is on Synthroid 50 mcg a day TSH was mildly elevated in the hospital at 6.423-we will await Dr. Redgie Grayer input-it appears he is on the same dose he was when he entered the hospital initially.  6.  History of depression continue Zoloft 25 mg a day this appears to be quite stable he is not complaining of depression today he appears to be in good spirits  Of note will update labs including  CBC and basic metabolic panel first laboratory day next week he does appear to have some history of mildly depressed sodium it was 130 on hospital discharge.  [id number] note greater than 35 minutes spent assessing patient-reviewing his chart and labs-and coordinating and formulating a plan of care for numerous diagnoses-of note greater than 50% of time spent coordinating a plan of care with input as noted above

## 2019-06-16 NOTE — Progress Notes (Signed)
Discharge summary was done on 12/22 by Dr Roger Shelter. Planned discharge delayed due to waiting for  insurance authorization. Patient clinically stable, no acute changes. Proceed to discharge to snf today. Follow up with vascular surgery Dr Donzetta Matters on 1/8 at 49 am.

## 2019-06-16 NOTE — Progress Notes (Signed)
Report given to patients Nurse at [facility]. Patient is agreeable to transfer.

## 2019-06-20 ENCOUNTER — Encounter: Payer: Self-pay | Admitting: Internal Medicine

## 2019-06-20 ENCOUNTER — Non-Acute Institutional Stay (SKILLED_NURSING_FACILITY): Payer: Medicare Other | Admitting: Internal Medicine

## 2019-06-20 DIAGNOSIS — I739 Peripheral vascular disease, unspecified: Secondary | ICD-10-CM

## 2019-06-20 DIAGNOSIS — E0810 Diabetes mellitus due to underlying condition with ketoacidosis without coma: Secondary | ICD-10-CM

## 2019-06-20 DIAGNOSIS — I25118 Atherosclerotic heart disease of native coronary artery with other forms of angina pectoris: Secondary | ICD-10-CM | POA: Diagnosis not present

## 2019-06-20 DIAGNOSIS — L03115 Cellulitis of right lower limb: Secondary | ICD-10-CM

## 2019-06-20 DIAGNOSIS — E034 Atrophy of thyroid (acquired): Secondary | ICD-10-CM | POA: Diagnosis not present

## 2019-06-20 DIAGNOSIS — I200 Unstable angina: Secondary | ICD-10-CM

## 2019-06-20 MED ORDER — OXYCODONE-ACETAMINOPHEN 5-325 MG PO TABS: 1.0000 | ORAL_TABLET | Freq: Two times a day (BID) | ORAL | 0 refills | Status: DC

## 2019-06-20 NOTE — Progress Notes (Signed)
: Provider:  Hennie Duos., MD Location:  [place] Room Number: 515-P Place of Service:  SNF (31)  PCP: Patient, [name] Patient Care Team: Patient, [name] as PCP - General (General Practice) Troy Sine, MD as PCP - Cardiology (Cardiology) Croitoru, Dani Gobble, MD as Consulting Physician (Cardiology)  Extended Emergency Contact Information Primary Emergency Contact: Debellis,Howard Address: [address]           [place], [place] [address] [place] Phone: [phone] Relation: Brother     Allergies: Coreg [carvedilol] and Sulfa antibiotics  Chief Complaint  Patient presents with  . Readmit To SNF    Patient seen for readmission to [facility] SNF    HPI: Patient is a 68 y.o. male with diabetes mellitus, CAD status post CABG and ongoing unstable angina, tobacco abuse, hypertension, hyperlipidemia, COPD, bilateral carotid artery disease, basal cell cancer, peripheral vascular disease status post right common femoral to femoral endarterectomy Dacron patch angioplasty, below the knee popliteal artery bypass and amputation of first and second toes on 12/10.  Patient presented to the ED from [facility] where he fell on 12/18 he presented with hip pain.  While in the ED was noted to have cellulitis of the foot and was therefore admitted to [facility] from 12/19-24 where he was seen by vascular surgery.  Mild cellulitis of the right foot was treated with Rocephin and Flagyl for 4 days and then switched to p.o. clindamycin for 10 more days for total of 2 weeks of antibiotic coverage.  Patient did not have any fractures of his hip.  Patient is admitted back to skilled nursing facility for OT/PT.  While at skilled nursing facility patient will be followed for.  CAD treated with aspirin Plavix Ranexa metoprolol and Crestor, hypothyroidism treated with Synthroid and diabetes mellitus treated with Janumet.  Past Medical  History:  Diagnosis Date  . Anxiety    off xanax  and paxil since 3/13  . Arthritis   . Bilateral carotid artery disease ([place])    s/p R ICA stent 03/28/2015 with distal protection. Known chronically occluded L ICA. Carotid stent complicated by hypotension and acute stroke in [place]  . Cancer ([place])    tumor basal cell rem from lft arm  . Cataract   . COPD (chronic obstructive pulmonary disease) ([place])   . Coronary artery disease    a. s/p CABG in 2011 with LIMA-LAD, SVG-RI/OM, and SVG-PDA b. occluded SVG-PDA by cath in 2015 c. 10/2016: NSTEMI with cath showing thrombus along the distal graft to insertion of SVG-OM2 with DES placed.   . CVA (cerebral infarction)    occured on 10/10 several days after R ICA carotid stenting  . Depression   . Diabetes mellitus   . GERD (gastroesophageal reflux disease)   . Heart attack ([place])   . High cholesterol   . Hypertension    type 2 NIDDM  . Myocardial infarct (HCC)    x4 last 10 yrs  . Pneumonia    hx  . 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 Smoker   . Substance abuse [facility])     Past Surgical History:  Procedure Laterality Date  . ABDOMINAL AORTOGRAM W/LOWER EXTREMITY Bilateral 05/31/2019   Procedure: ABDOMINAL AORTOGRAM W/LOWER EXTREMITY;  Surgeon: Waynetta Sandy, MD;  Location: [place] CV LAB;  Service: Cardiovascular;  Laterality: Bilateral;  . AMPUTATION  TOE Right 06/02/2019   Procedure: Amputation Right Great Toe and Second Toe;  Surgeon: Waynetta Sandy, MD;  Location: [place];  Service: Vascular;  Laterality: Right;  . BACK SURGERY  Jan 2014, Dec 2011   Dr Vertell Limber  . CARDIAC CATHETERIZATION  4/12   Medical Rx  . CORONARY ANGIOGRAM  01/17/14   med rx  . CORONARY ANGIOGRAM  4/13   med Rx  . CORONARY ANGIOGRAM  1/15   Med Rx  . CORONARY ANGIOPLASTY  Jan 2004   RCA  . CORONARY ARTERY BYPASS GRAFT  07/24/2009   L-LAD, SVG-RI/OM, SVG-PDA  . CORONARY STENT  INTERVENTION N/A 10/23/2016   Procedure: Coronary Stent Intervention;  Surgeon: Peter M Martinique, MD;  Location: [place] CV LAB;  Service: Cardiovascular;  Laterality: N/A;  . ENDARTERECTOMY FEMORAL Right 06/02/2019   Procedure: Endarterectomy External Iliac  Femoral Artery and Profunda;  Surgeon: Waynetta Sandy, MD;  Location: [place];  Service: Vascular;  Laterality: Right;  . EYE SURGERY     cat bil  . FEMORAL ARTERY - FEMORAL ARTERY BYPASS GRAFT Right 2004   femoral enarterectomy  . FEMORAL-POPLITEAL BYPASS GRAFT Right 06/02/2019   Procedure: RIGHT LEG BYPASS GRAFT FEMORAL-POPLITEAL ARTERY using Gore Propaten Vascular Graft Removable Ring;  Surgeon: Waynetta Sandy, MD;  Location: [place];  Service: Vascular;  Laterality: Right;  . LEFT HEART CATH AND CORS/GRAFTS ANGIOGRAPHY N/A 10/23/2016   Procedure: Left Heart Cath and Cors/Grafts Angiography;  Surgeon: Peter M Martinique, MD;  Location: [place] CV LAB;  Service: Cardiovascular;  Laterality: N/A;  . LEFT HEART CATHETERIZATION WITH CORONARY ANGIOGRAM N/A 10/03/2011   Procedure: LEFT HEART CATHETERIZATION WITH CORONARY ANGIOGRAM;  Surgeon: Pixie Casino, MD;  Location: [facility] CATH LAB;  Service: Cardiovascular;  Laterality: N/A;  . LEFT HEART CATHETERIZATION WITH CORONARY ANGIOGRAM N/A 07/08/2013   Procedure: LEFT HEART CATHETERIZATION WITH CORONARY ANGIOGRAM;  Surgeon: Blane Ohara, MD;  Location: [facility] CATH LAB;  Service: Cardiovascular;  Laterality: N/A;  . LEFT HEART CATHETERIZATION WITH CORONARY/GRAFT ANGIOGRAM N/A 01/17/2014   Procedure: LEFT HEART CATHETERIZATION WITH Beatrix Fetters;  Surgeon: Troy Sine, MD;  Location: [facility] CATH LAB;  Service: Cardiovascular;  Laterality: N/A;  . PATCH ANGIOPLASTY Right 06/02/2019   Procedure: Patch Angioplasty using Hemashield Platinum Finesse Patch of the External Iliac Femoral Artery and Profunda;  Surgeon: Waynetta Sandy, MD;  Location: [place];  Service: Vascular;   Laterality: Right;  . PERIPHERAL VASCULAR CATHETERIZATION N/A 03/28/2015   Procedure: Carotid PTA/Stent Intervention;  Surgeon: Lorretta Harp, MD;  Location: [place] CV LAB;  Service: Cardiovascular;  Laterality: N/A;  . US EXTREMITY*L*     lft arm tumor removed     Allergies as of 06/20/2019      Reactions   Coreg [carvedilol] Nausea And Vomiting, Other (See Comments)   Per patient made him dizzy and light sensitive   Sulfa Antibiotics Nausea And Vomiting, Other (See Comments)   Also headaches      Medication List       Accurate as of June 20, 2019 10:16 AM. If you have any questions, ask your nurse or doctor.        acetaminophen 325 MG tablet Commonly known as: TYLENOL Take 650 mg by mouth every 4 (four) hours as needed for mild pain or headache.   aspirin 81 MG tablet Take 1 tablet (81 mg total) by mouth daily.   clindamycin 300 MG capsule Commonly known as: CLEOCIN Take 1  capsule (300 mg total) by mouth 3 (three) times daily for 10 days.   clopidogrel 75 MG tablet Commonly known as: PLAVIX Take 1 tablet (75 mg total) by mouth daily.   Janumet 50-500 MG tablet Generic drug: sitaGLIPtin-metformin Take 1 tablet by mouth 2 (two) times daily with a meal.   lactobacillus acidophilus & bulgar chewable tablet Chew 1 tablet by mouth 3 (three) times daily with meals for 12 days.   levothyroxine 50 MCG tablet Commonly known as: SYNTHROID TAKE 1 TABLET BY MOUTH EVERY DAY   loratadine 10 MG tablet Commonly known as: CLARITIN Take 10 mg by mouth daily.   metoprolol succinate 25 MG 24 hr tablet Commonly known as: TOPROL-XL Take 1 tablet (25 mg total) by mouth daily.   nitroGLYCERIN 0.4 MG SL tablet Commonly known as: NITROSTAT PLACE 1 TABLET (0.4 MG TOTAL) UNDER THE TONGUE EVERY 5 (FIVE) MINUTES AS NEEDED FOR CHEST PAIN.   oxyCODONE-acetaminophen 5-325 MG tablet Commonly known as: PERCOCET/ROXICET Take 1 tablet by mouth every 6 (six) hours as needed for  up to 4 days for severe pain.   ranolazine 1000 MG SR tablet Commonly known as: RANEXA TAKE 1 TABLET BY MOUTH 2 TIMES DAILY. NEED OV.   rosuvastatin 10 MG tablet Commonly known as: CRESTOR Take 10 mg by mouth daily.   sertraline 100 MG tablet Commonly known as: ZOLOFT Take 2 and 1/2 tablets by mouth daily       No orders of the defined types were placed in this encounter.   Immunization History  Administered Date(s) Administered  . Influenza Inj Mdck Quad Pf 04/27/2017  . Influenza Split 07/17/2012  . Influenza, High Dose Seasonal PF 05/06/2018, 02/15/2019  . Influenza,inj,Quad PF,6+ Mos 07/12/2015  . Influenza-Unspecified 04/23/2017  . Pneumococcal Polysaccharide-23 03/30/2015    Social History   Tobacco Use  . Smoking status: Current Every Day Smoker    Packs/day: 1.00    Years: 48.00    Pack years: 48.00    Types: Cigarettes  . Smokeless tobacco: Never Used  Substance Use Topics  . Alcohol use: Yes    Alcohol/week: 0.0 standard drinks    Comment: socially    Family history is   Family History  Problem Relation Age of Onset  . Arthritis Mother   . Heart disease Father       Review of Systems   GENERAL:  no fevers, fatigue, appetite changes SKIN: No itching, or rash EYES: No eye pain, redness, discharge EARS: No earache, tinnitus, change in hearing NOSE: No congestion, drainage or bleeding  MOUTH/THROAT: No mouth or tooth pain, No sore throat RESPIRATORY: No cough, wheezing, SOB CARDIAC: No chest pain, palpitations, lower extremity edema  GI: No abdominal pain, No N/V/D or constipation, No heartburn or reflux  GU: No dysuria, frequency or urgency, or incontinence  MUSCULOSKELETAL: No unrelieved bone/joint pain NEUROLOGIC: No headache, dizziness or focal weakness PSYCHIATRIC: No c/o anxiety or sadness   Vitals:   06/20/19 1005  BP: (!) 120/52  Pulse: 64  Resp: 20  Temp: 97.7 F (36.5 C)  SpO2: 97%    SpO2 Readings from Last 1 Encounters:   06/20/19 97%   Body mass index is 24.25 kg/m.     Physical Exam  GENERAL APPEARANCE: Alert, conversant,  No acute distress.  SKIN: Dressing in place right foot HEAD: Normocephalic, atraumatic  EYES: Conjunctiva/lids clear. Pupils round, reactive. EOMs intact.  EARS: External exam WNL, canals clear. Hearing grossly normal.  NOSE: No deformity or discharge.  MOUTH/THROAT: Lips w/o lesions  RESPIRATORY: Breathing is even, unlabored. Lung sounds are clear   CARDIOVASCULAR: Heart RRR no murmurs, rubs or gallops. No peripheral edema.   GASTROINTESTINAL: Abdomen is soft, non-tender, not distended w/ normal bowel sounds. GENITOURINARY: Bladder non tender, not distended  MUSCULOSKELETAL: No abnormal joints or musculature NEUROLOGIC:  Cranial nerves 2-12 grossly intact. Moves all extremities  PSYCHIATRIC: Mood and affect appropriate to situation, no behavioral issues  Patient Active Problem List   Diagnosis Date Noted  . Nicotine abuse 06/12/2019  . Cellulitis of right foot 06/11/2019  . Diabetic foot infection ([place]) 06/11/2019  . PAD (peripheral artery disease) ([place]) 06/11/2019  . Cellulitis 05/27/2019  . Gangrene of toe ([place]) 05/27/2019  . Status post lumbar spinal fusion 10/27/2017  . Acute on chronic combined systolic and diastolic CHF (congestive heart failure) ([place]) 10/25/2016  . NSTEMI (non-ST elevated myocardial infarction) ([place]) 10/22/2016  . Hypothyroidism 08/17/2015  . RBBB 05/11/2015  . Hyponatremia 04/04/2015  . Hypotension 04/04/2015  . Hemispheric carotid artery syndrome   . Carotid artery narrowing 03/28/2015  . Carotid stenosis- moderate 2011, 95% 2016 s/p stent 02/21/2014  . Unstable angina ([place]) 01/16/2014  . Tobacco abuse 01/16/2014  . Substance abuse in remission ([place]) 10/01/2012  . Radiculitis 02/10/2012  . CAD, CABG Feb 2011, cath x 4 since-medical Rx 10/03/2011  . PVD, hx Rt femoral endarterectomy 2004 10/03/2011  . DM (diabetes mellitus) ([place])  10/02/2011  . HTN (hypertension) 10/02/2011  . Hyperlipidemia 10/02/2011      Labs reviewed: Basic Metabolic Panel:    Component Value Date/Time   NA 130 (L) 06/12/2019 0448   NA 131 (L) 09/30/2017 1629   K 4.6 06/12/2019 0448   CL 95 (L) 06/12/2019 0448   CO2 24 06/12/2019 0448   GLUCOSE 129 (H) 06/12/2019 0448   BUN 14 06/12/2019 0448   BUN 13 09/30/2017 1629   CREATININE 1.18 06/12/2019 0448   CREATININE 1.14 07/12/2015 1220   CALCIUM 9.4 06/12/2019 0448   PROT 7.0 06/12/2019 0448   PROT 6.8 09/30/2017 1629   ALBUMIN 3.2 (L) 06/12/2019 0448   ALBUMIN 4.3 09/30/2017 1629   AST 20 06/12/2019 0448   ALT 15 06/12/2019 0448   ALKPHOS 76 06/12/2019 0448   BILITOT 0.5 06/12/2019 0448   BILITOT 0.3 09/30/2017 1629   GFRNONAA >60 06/12/2019 0448   GFRNONAA 69 07/12/2015 1220   GFRAA >60 06/12/2019 0448   GFRAA 79 07/12/2015 1220    Recent Labs    06/04/19 0254 06/05/19 0233 06/11/19 0243 06/12/19 0448  NA 136 135 131* 130*  K 5.1 4.3 4.9 4.6  CL 107 106 95* 95*  CO2 20* 20*  --  24  GLUCOSE 139* 122* 95 129*  BUN 15 12 14 14   CREATININE 1.02 0.97 1.20 1.18  CALCIUM 8.6* 8.6*  --  9.4  MG  --   --   --  1.7  PHOS  --   --   --  4.2   Liver Function Tests: Recent Labs    06/12/19 0448  AST 20  ALT 15  ALKPHOS 76  BILITOT 0.5  PROT 7.0  ALBUMIN 3.2*   No results for input(s): LIPASE, AMYLASE in the last 8760 hours. No results for input(s): AMMONIA in the last 8760 hours. CBC: Recent Labs    06/07/19 0316 06/11/19 0230 06/11/19 0243 06/12/19 0448  WBC 11.0* 11.3*  --  8.3  NEUTROABS 7.3 7.8*  --  5.4  HGB 9.1* 10.3* 10.5*  10.0*  HCT 27.6* 31.9* 31.0* 30.3*  MCV 96.8 100.0  --  95.6  PLT 327 366  --  355   Lipid No results for input(s): CHOL, HDL, LDLCALC, TRIG in the last 8760 hours.  Cardiac Enzymes: No results for input(s): CKTOTAL, CKMB, CKMBINDEX, TROPONINI in the last 8760 hours. BNP: No results for input(s): BNP in the last 8760  hours. Lab Results  Component Value Date   MICROALBUR 0.7 07/12/2015   Lab Results  Component Value Date   HGBA1C 8.5 (H) 05/27/2019   Lab Results  Component Value Date   TSH 6.423 (H) 06/11/2019   No results found for: VITAMINB12 No results found for: FOLATE No results found for: IRON, TIBC, FERRITIN  Imaging and Procedures obtained prior to SNF admission: MRI Right foot with and without contrast  Result Date: 06/11/2019 CLINICAL DATA:  Revascularization procedure and partial amputation through the 1st and 2nd metatarsals on 06/02/2019. Patient fell 3 days ago with increasing right foot pain and discharge. EXAM: MRI OF THE RIGHT FOREFOOT WITHOUT AND WITH CONTRAST TECHNIQUE: Multiplanar, multisequence MR imaging of the right forefoot was performed before and after the administration of intravenous contrast. CONTRAST:  45mL GADAVIST GADOBUTROL 1 MMOL/ML IV SOLN COMPARISON:  Radiographs 06/11/2019 and 05/27/2019. FINDINGS: Despite efforts by the technologist and patient, mild motion artifact is present on today's exam and could not be eliminated. This reduces exam sensitivity and specificity. Bones/Joint/Cartilage Status post amputation through the mid shaft of the 1st metatarsal and through the distal shaft of the 2nd metatarsal. Low level enhancement along the distal margins of the remaining 1st and 2nd metatarsals is nonspecific in light of the recent surgery. No cortical destruction identified. The 3rd, 4th and 5th rays appear unremarkable. The tarsal bones appear unremarkable. Ligaments Intact Lisfranc ligament. Muscles and Tendons No focal intramuscular fluid collections or suspicious enhancement. No significant tenosynovitis. Soft tissues Irregular fluid collections are noted adjacent to the amputation sites with peripheral enhancement following contrast. These collections measure up to 3.1 cm on image 10/3. The collections extend dorsally, although no well-defined sinus tract to the skin is  identified. There is no soft tissue emphysema. There is generalized subcutaneous edema throughout the forefoot. IMPRESSION: 1. Status post amputation through the mid shaft of the 1st metatarsal and through the distal shaft of the 2nd metatarsal. Low level enhancement along the distal margins of the 1st and 2nd metatarsals is nonspecific in light of the recent surgery. No cortical destruction or soft tissue emphysema identified. 2. Irregular fluid collections adjacent to the amputation sites are nonspecific, although could reflect abscesses, especially if there is drainage of purulent fluid. No well-defined sinus tract identified. Electronically Signed   By: Richardean Sale M.D.   On: 06/11/2019 15:05   DG Foot Complete Right  Result Date: 06/11/2019 CLINICAL DATA:  Right foot pain. Surgery to remove 2 toes from right foot 06/02/2019. EXAM: RIGHT FOOT COMPLETE - 3+ VIEW COMPARISON:  Preoperative radiograph 05/27/2019 FINDINGS: Transmetatarsal resection of the first and second rays. Developing heterotopic calcification at the resection margin. Slight irregularity of the surgical margin of the second metatarsal. The remainder the foot is intact. No acute fracture. Small plantar calcaneal spur. Remote distal fibular fracture. 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 Slight developing heterotopic ossification at the operative bed. Resection margin of the second ray is minimally irregular, this may be normal postoperative healing or early osteomyelitis. Consider further evaluation with MRI based on clinical concern. Electronically Signed  By: Keith Rake M.D.   On: 06/11/2019 02:08   DG HIP UNILAT WITH PELVIS 2-3 VIEWS RIGHT  Result Date: 06/11/2019 CLINICAL DATA:  Fall yesterday.  Right hip pain. EXAM: DG HIP (WITH OR WITHOUT PELVIS) 2-3V RIGHT COMPARISON:  None. FINDINGS: The cortical margins of the bony pelvis and right hip are intact. No fracture. Pubic symphysis and  sacroiliac joints are congruent. Both femoral heads are well-seated in the respective acetabula. Bones are under mineralized. Surgical hardware in the lower lumbar spine is partially included. There is mild soft tissue edema lateral to the right hip. IMPRESSION: 1. No acute fracture of the pelvis or right hip. 2. Lateral soft tissue edema. Electronically Signed   By: Keith Rake M.D.   On: 06/11/2019 02:05     Not all labs, radiology exams or other studies done during hospitalization come through on my EPIC note; however they are reviewed by me.    Assessment and Plan  Cellulitis right foot/peripheral vascular disease/status post femoropopliteal bypass with graft and first and second toe amputation of right-treated with Rocephin and Flagyl for 4 days and switching to clindamycin on discharge for 2 more days SNF-admitted for OT/PT; continue clindamycin 300 mg 3 times daily for 10 more days; wound care to follow right foot wound  Diabetes mellitus-last A1c 8.4 SNF-return to Janumet 50/501 p.o. twice daily  Coronary artery disease status post CABG/unstable angina SNF-continue is a 81 mg daily, Plavix 75 mg daily, metoprolol XL 25 mg daily Ranexa 1000 mg 2 times daily and sublingual nitroglycerin as needed; patient is on Crestor  Hypothyroidism SNF-not stated as uncontrolled; continue Synthroid 50 mcg daily    Hennie Duos, MD

## 2019-06-22 ENCOUNTER — Telehealth: Payer: Self-pay | Admitting: Vascular Surgery

## 2019-06-22 NOTE — Telephone Encounter (Signed)
-----   Message from Waynetta Sandy, MD sent at 06/22/2019  1:22 PM EST ----- Regarding: RE: Weight Bearing Shoe He is okay for heel weightbearing activity.  Erlene Quan ----- Message ----- From: Kaleen Mask, LPN Sent: [id number]   9:19 AM EST To: Waynetta Sandy, MD Subject: Weight Bearing Shoe                            Good Morning Dr. Donzetta Matters.  Yesterday a Physical Therapist with [facility] called regarding this patient.    He is choosing not to wear his Darco weight bearing shoe because he feels it is too long and throws him off balance. PT explained the risks of not wearing it and putting full weight on foot.   Do they need to limit walking even more, stop him from walking, etc?  Please advise.  Thank you,  Thurston Hole., LPN

## 2019-06-22 NOTE — Telephone Encounter (Signed)
I called and left this message on Susan's voicemail.  Thurston Hole., LPN

## 2019-06-25 ENCOUNTER — Encounter: Payer: Self-pay | Admitting: Internal Medicine

## 2019-07-01 ENCOUNTER — Other Ambulatory Visit: Payer: Self-pay

## 2019-07-01 ENCOUNTER — Ambulatory Visit (INDEPENDENT_AMBULATORY_CARE_PROVIDER_SITE_OTHER): Payer: Self-pay | Admitting: Vascular Surgery

## 2019-07-01 ENCOUNTER — Encounter: Payer: Self-pay | Admitting: Vascular Surgery

## 2019-07-01 VITALS — BP 137/67 | HR 65 | Temp 97.8°F | Resp 20 | Ht 69.0 in | Wt 164.0 lb

## 2019-07-01 DIAGNOSIS — I739 Peripheral vascular disease, unspecified: Secondary | ICD-10-CM

## 2019-07-01 NOTE — Progress Notes (Signed)
    Subjective:     Patient ID: Jason [name], male   DOB: March 27, 1951, 69 y.o.   MRN: [id number]  HPI 69 year old male follows up after right lower extremity bypass and first and second toe amputations.  He is doing well currently at [place].  States that foot is healing well.  Does not have any left lower extremity complaints.   Review of Systems No complaints today    Objective:   Physical Exam Vitals:   07/01/19 1100  BP: 137/67  Pulse: 65  Resp: 20  Temp: 97.8 F (36.6 C)  SpO2: 96%   Awake alert oriented Right groin incision well-healed Right below-knee incision well-healed Right first toe and second toe amputation site healing well with sutures in place, removed at bedside Strong posterior tibial signal on the right and brisk capillary refill in the foot     Assessment/plan     69 year old male status post right femoropopliteal bypass with graft with toe amputation sites that are healing.  We will have him follow-up in 4 weeks for further wound check.  Also has severe depressed ABI on the left and although no current wounds does have some pain in the foot and may require angiography to evaluate the left foot in the near future.  I discussed this with him also discussed protecting his left foot particularly with a wheelchair.  He will be heel weightbearing on the right until we can get the foot fully healed.        Jerral Mccauley C. Donzetta Matters, MD [facility] Office: [phone] Pager: [phone]

## 2019-07-04 ENCOUNTER — Encounter: Payer: Self-pay | Admitting: Internal Medicine

## 2019-07-04 ENCOUNTER — Non-Acute Institutional Stay (SKILLED_NURSING_FACILITY): Payer: Medicare Other | Admitting: Internal Medicine

## 2019-07-04 DIAGNOSIS — E0810 Diabetes mellitus due to underlying condition with ketoacidosis without coma: Secondary | ICD-10-CM

## 2019-07-04 DIAGNOSIS — I739 Peripheral vascular disease, unspecified: Secondary | ICD-10-CM | POA: Diagnosis not present

## 2019-07-04 DIAGNOSIS — S98111A Complete traumatic amputation of right great toe, initial encounter: Secondary | ICD-10-CM

## 2019-07-04 DIAGNOSIS — E034 Atrophy of thyroid (acquired): Secondary | ICD-10-CM

## 2019-07-04 DIAGNOSIS — L03115 Cellulitis of right lower limb: Secondary | ICD-10-CM | POA: Diagnosis not present

## 2019-07-04 DIAGNOSIS — I25118 Atherosclerotic heart disease of native coronary artery with other forms of angina pectoris: Secondary | ICD-10-CM | POA: Diagnosis not present

## 2019-07-04 DIAGNOSIS — F329 Major depressive disorder, single episode, unspecified: Secondary | ICD-10-CM

## 2019-07-04 MED ORDER — METOPROLOL SUCCINATE ER 25 MG PO TB24: 25.0000 mg | ORAL_TABLET | Freq: Every day | ORAL | 0 refills | Status: DC

## 2019-07-04 MED ORDER — LEVOTHYROXINE SODIUM 50 MCG PO TABS: 50.0000 ug | ORAL_TABLET | Freq: Every day | ORAL | 0 refills | Status: DC

## 2019-07-04 MED ORDER — NITROGLYCERIN 0.4 MG SL SUBL: SUBLINGUAL_TABLET | SUBLINGUAL | 0 refills | Status: DC

## 2019-07-04 MED ORDER — RANOLAZINE ER 1000 MG PO TB12: ORAL_TABLET | ORAL | 0 refills | Status: DC

## 2019-07-04 MED ORDER — SERTRALINE HCL 100 MG PO TABS: ORAL_TABLET | ORAL | 0 refills | Status: DC

## 2019-07-04 MED ORDER — JANUMET 50-500 MG PO TABS: 1.0000 | ORAL_TABLET | Freq: Two times a day (BID) | ORAL | 0 refills | Status: DC

## 2019-07-04 MED ORDER — CLOPIDOGREL BISULFATE 75 MG PO TABS: 75.0000 mg | ORAL_TABLET | Freq: Every day | ORAL | 0 refills | Status: DC

## 2019-07-04 MED ORDER — ROSUVASTATIN CALCIUM 10 MG PO TABS: 10.0000 mg | ORAL_TABLET | Freq: Every day | ORAL | 0 refills | Status: DC

## 2019-07-04 NOTE — Progress Notes (Signed)
Location:   [facility]  Provider:Nakima Fluegge Sheral Apley MD Place of Service:   SNF  PCP: Patient, [name] Patient Care Team: Patient, [name] as PCP - General (General Practice) Troy Sine, MD as PCP - Cardiology (Cardiology) Croitoru, Dani Gobble, MD as Consulting Physician (Cardiology)  Extended Emergency Contact Information Primary Emergency Contact: Smits,Howard Address: [address]           [place],  [address] [place] Phone: [phone] Relation: Brother  Allergies  Allergen Reactions  . Coreg [Carvedilol] Nausea And Vomiting and Other (See Comments)    Per patient made him dizzy and light sensitive  . Sulfa Antibiotics Nausea And Vomiting and Other (See Comments)    Also headaches     Chief Complaint  Patient presents with  . Discharge Note    HPI:  69 y.o. male with diabetes mellitus, CAD status post CABG and ongoing unstable angina, tobacco abuse, hypertension, hyperlipidemia, COPD, bilateral carotid artery disease, basal cell cancer, peripheral vascular disease status post right, common femoral to femoral endarterectomy Dacron patch angioplasty, below the knee popliteal artery bypass and amputation of first and second toes on 12/10.  Patient presented to ED from [facility] where he fell on 12/18 and presented with hip pain.  While in the ED was noted patient to have cellulitis of the foot and was therefore admitted to [facility] from 12/19-24 where he was seen by vascular surgery.  Mild cellulitis of the right foot was treated with Rocephin and Flagyl for 4 days and then switched to p.o. clindamycin for 10 more days for total of 2 weeks of antibiotic coverage.  Patient did not have any fractures.  Patient was admitted back to skilled nursing facility for OT/PT and is now ready to be discharged to home.      Past Medical History:  Diagnosis Date  . Anxiety    off xanax  and paxil since 3/13  . Arthritis   . Bilateral  carotid artery disease ([place])    s/p R ICA stent 03/28/2015 with distal protection. Known chronically occluded L ICA. Carotid stent complicated by hypotension and acute stroke in [place]  . Cancer ([place])    tumor basal cell rem from lft arm  . Cataract   . COPD (chronic obstructive pulmonary disease) ([place])   . Coronary artery disease    a. s/p CABG in 2011 with LIMA-LAD, SVG-RI/OM, and SVG-PDA b. occluded SVG-PDA by cath in 2015 c. 10/2016: NSTEMI with cath showing thrombus along the distal graft to insertion of SVG-OM2 with DES placed.   . CVA (cerebral infarction)    occured on 10/10 several days after R ICA carotid stenting  . Depression   . Diabetes mellitus   . GERD (gastroesophageal reflux disease)   . Heart attack ([place])   . High cholesterol   . Hypertension    type 2 NIDDM  . Myocardial infarct (HCC)    x4 last 10 yrs  . Pneumonia    hx  . 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 Smoker   . Substance abuse [facility])     Past Surgical History:  Procedure Laterality Date  . ABDOMINAL AORTOGRAM W/LOWER EXTREMITY Bilateral 05/31/2019   Procedure: ABDOMINAL AORTOGRAM W/LOWER EXTREMITY;  Surgeon: Waynetta Sandy, MD;  Location: [place] CV LAB;  Service: Cardiovascular;  Laterality: Bilateral;  . AMPUTATION TOE Right 06/02/2019   Procedure: Amputation Right  Great Toe and Second Toe;  Surgeon: Waynetta Sandy, MD;  Location: [place];  Service: Vascular;  Laterality: Right;  . BACK SURGERY  Jan 2014, Dec 2011   Dr Vertell Limber  . CARDIAC CATHETERIZATION  4/12   Medical Rx  . CORONARY ANGIOGRAM  01/17/14   med rx  . CORONARY ANGIOGRAM  4/13   med Rx  . CORONARY ANGIOGRAM  1/15   Med Rx  . CORONARY ANGIOPLASTY  Jan 2004   RCA  . CORONARY ARTERY BYPASS GRAFT  07/24/2009   L-LAD, SVG-RI/OM, SVG-PDA  . CORONARY STENT INTERVENTION N/A 10/23/2016   Procedure: Coronary Stent Intervention;  Surgeon: Peter M Martinique, MD;  Location: [place] CV LAB;  Service: Cardiovascular;  Laterality: N/A;  . ENDARTERECTOMY FEMORAL Right 06/02/2019   Procedure: Endarterectomy External Iliac  Femoral Artery and Profunda;  Surgeon: Waynetta Sandy, MD;  Location: [place];  Service: Vascular;  Laterality: Right;  . EYE SURGERY     cat bil  . FEMORAL ARTERY - FEMORAL ARTERY BYPASS GRAFT Right 2004   femoral enarterectomy  . FEMORAL-POPLITEAL BYPASS GRAFT Right 06/02/2019   Procedure: RIGHT LEG BYPASS GRAFT FEMORAL-POPLITEAL ARTERY using Gore Propaten Vascular Graft Removable Ring;  Surgeon: Waynetta Sandy, MD;  Location: [place];  Service: Vascular;  Laterality: Right;  . LEFT HEART CATH AND CORS/GRAFTS ANGIOGRAPHY N/A 10/23/2016   Procedure: Left Heart Cath and Cors/Grafts Angiography;  Surgeon: Peter M Martinique, MD;  Location: [place] CV LAB;  Service: Cardiovascular;  Laterality: N/A;  . LEFT HEART CATHETERIZATION WITH CORONARY ANGIOGRAM N/A 10/03/2011   Procedure: LEFT HEART CATHETERIZATION WITH CORONARY ANGIOGRAM;  Surgeon: Pixie Casino, MD;  Location: [facility] CATH LAB;  Service: Cardiovascular;  Laterality: N/A;  . LEFT HEART CATHETERIZATION WITH CORONARY ANGIOGRAM N/A 07/08/2013   Procedure: LEFT HEART CATHETERIZATION WITH CORONARY ANGIOGRAM;  Surgeon: Blane Ohara, MD;  Location: [facility] CATH LAB;  Service: Cardiovascular;  Laterality: N/A;  . LEFT HEART CATHETERIZATION WITH CORONARY/GRAFT ANGIOGRAM N/A 01/17/2014   Procedure: LEFT HEART CATHETERIZATION WITH Beatrix Fetters;  Surgeon: Troy Sine, MD;  Location: [facility] CATH LAB;  Service: Cardiovascular;  Laterality: N/A;  . PATCH ANGIOPLASTY Right 06/02/2019   Procedure: Patch Angioplasty using Hemashield Platinum Finesse Patch of the External Iliac Femoral Artery and Profunda;  Surgeon: Waynetta Sandy, MD;  Location: [place];  Service: Vascular;  Laterality: Right;  . PERIPHERAL VASCULAR CATHETERIZATION N/A 03/28/2015   Procedure: Carotid PTA/Stent  Intervention;  Surgeon: Lorretta Harp, MD;  Location: [place] CV LAB;  Service: Cardiovascular;  Laterality: N/A;  . US EXTREMITY*L*     lft arm tumor removed      reports that he has been smoking cigarettes. He has a 48.00 pack-year smoking history. He has never used smokeless tobacco. He reports current alcohol use. He reports that he does not use drugs. Social History   Socioeconomic History  . Marital status: Single    Spouse name: Not on file  . Number of children: 1  . Years of education: 60  . Highest education level: Not on file  Occupational History  . Occupation: welder-fabricator  Tobacco Use  . Smoking status: Current Every Day Smoker    Packs/day: 1.00    Years: 48.00    Pack years: 48.00    Types: Cigarettes  . Smokeless tobacco: Never Used  Substance and Sexual Activity  . Alcohol use: Yes    Alcohol/week: 0.0 standard drinks    Comment: socially  .  Drug use: No    Comment: hx of cocaine use  . Sexual activity: Not Currently  Other Topics Concern  . Not on file  Social History Narrative   Lives alone   Caffeine use: Drinks pepsi/coffee (32 oz diet pepsi per day)    Exercise walking 4 times per week for 1 mile   Current PPD smoker.    Social Determinants of Health   Financial Resource Strain:   . Difficulty of Paying Living Expenses: Not on file  Food Insecurity:   . Worried About Charity fundraiser in the Last Year: Not on file  . Ran Out of Food in the Last Year: Not on file  Transportation Needs:   . Lack of Transportation (Medical): Not on file  . Lack of Transportation (Non-Medical): Not on file  Physical Activity:   . Days of Exercise per Week: Not on file  . Minutes of Exercise per Session: Not on file  Stress:   . Feeling of Stress : Not on file  Social Connections:   . Frequency of Communication with Friends and Family: Not on file  . Frequency of Social Gatherings with Friends and Family: Not on file  . Attends Religious  Services: Not on file  . Active Member of Clubs or Organizations: Not on file  . Attends Archivist Meetings: Not on file  . Marital Status: Not on file  Intimate Partner Violence:   . Fear of Current or Ex-Partner: Not on file  . Emotionally Abused: Not on file  . Physically Abused: Not on file  . Sexually Abused: Not on file    Pertinent  Health Maintenance Due  Topic Date Due  . OPHTHALMOLOGY EXAM  08/18/1962  . COLONOSCOPY  08/18/2002  . URINE MICROALBUMIN  07/11/2016  . PNA vac Low Risk Adult (1 of 2 - PCV13) 08/18/2017  . FOOT EXAM  10/01/2018  . HEMOGLOBIN A1C  11/25/2019  . INFLUENZA VACCINE  Completed    Medications: Allergies as of 07/04/2019      Reactions   Coreg [carvedilol] Nausea And Vomiting, Other (See Comments)   Per patient made him dizzy and light sensitive   Sulfa Antibiotics Nausea And Vomiting, Other (See Comments)   Also headaches      Medication List       Accurate as of July 04, 2019 11:59 PM. If you have any questions, ask your nurse or doctor.        STOP taking these medications   oxyCODONE-acetaminophen 5-325 MG tablet Commonly known as: PERCOCET/ROXICET Stopped by: Inocencio Homes, MD     TAKE these medications   acetaminophen 325 MG tablet Commonly known as: TYLENOL Take 650 mg by mouth every 6 (six) hours as needed for mild pain or headache.   aspirin 81 MG tablet Take 1 tablet (81 mg total) by mouth daily.   clopidogrel 75 MG tablet Commonly known as: PLAVIX Take 1 tablet (75 mg total) by mouth daily.   Janumet 50-500 MG tablet Generic drug: sitaGLIPtin-metformin Take 1 tablet by mouth 2 (two) times daily with a meal.   levothyroxine 50 MCG tablet Commonly known as: SYNTHROID Take 1 tablet (50 mcg total) by mouth daily.   loratadine 10 MG tablet Commonly known as: CLARITIN Take 10 mg by mouth daily.   metoprolol succinate 25 MG 24 hr tablet Commonly known as: TOPROL-XL Take 1 tablet (25 mg total) by  mouth daily.   nitroGLYCERIN 0.4 MG SL tablet Commonly known as: NITROSTAT  PLACE 1 TABLET (0.4 MG TOTAL) UNDER THE TONGUE EVERY 5 (FIVE) MINUTES times 3 then call MD What changed: additional instructions Changed by: Inocencio Homes, MD   ranolazine 1000 MG SR tablet Commonly known as: RANEXA TAKE 1 TABLET BY MOUTH 2 TIMES DAILY. NEED OV.   rosuvastatin 10 MG tablet Commonly known as: CRESTOR Take 1 tablet (10 mg total) by mouth daily.   sertraline 100 MG tablet Commonly known as: ZOLOFT Take 2 and 1/2 tablets by mouth daily        Vitals:   07/11/19 2250  BP: 137/67  Pulse: 65  Resp: 20  Temp: 97.8 F (36.6 C)  SpO2: 96%   There is no height or weight on file to calculate BMI.  Physical Exam  GENERAL APPEARANCE: Alert, conversant. No acute distress.  HEENT: Unremarkable. RESPIRATORY: Breathing is even, unlabored. Lung sounds are clear   CARDIOVASCULAR: Heart RRR no murmurs, rubs or gallops. No peripheral edema.  GASTROINTESTINAL: Abdomen is soft, non-tender, not distended w/ normal bowel sounds.  NEUROLOGIC: Cranial nerves 2-12 grossly intact. Moves all extremities   Labs reviewed: Basic Metabolic Panel: Recent Labs    06/04/19 0254 06/04/19 0254 06/05/19 0233 06/11/19 0243 06/12/19 0448  NA 136   < > 135 131* 130*  K 5.1   < > 4.3 4.9 4.6  CL 107   < > 106 95* 95*  CO2 20*  --  20*  --  24  GLUCOSE 139*   < > 122* 95 129*  BUN 15   < > 12 14 14   CREATININE 1.02   < > 0.97 1.20 1.18  CALCIUM 8.6*  --  8.6*  --  9.4  MG  --   --   --   --  1.7  PHOS  --   --   --   --  4.2   < > = values in this interval not displayed.   Lab Results  Component Value Date   MICROALBUR 0.7 07/12/2015   Liver Function Tests: Recent Labs    06/12/19 0448  AST 20  ALT 15  ALKPHOS 76  BILITOT 0.5  PROT 7.0  ALBUMIN 3.2*   No results for input(s): LIPASE, AMYLASE in the last 8760 hours. No results for input(s): AMMONIA in the last 8760 hours. CBC: Recent Labs     06/07/19 0316 06/07/19 0316 06/11/19 0230 06/11/19 0243 06/12/19 0448  WBC 11.0*  --  11.3*  --  8.3  NEUTROABS 7.3  --  7.8*  --  5.4  HGB 9.1*   < > 10.3* 10.5* 10.0*  HCT 27.6*   < > 31.9* 31.0* 30.3*  MCV 96.8  --  100.0  --  95.6  PLT 327  --  366  --  355   < > = values in this interval not displayed.   Lipid No results for input(s): CHOL, HDL, LDLCALC, TRIG in the last 8760 hours. Cardiac Enzymes: No results for input(s): CKTOTAL, CKMB, CKMBINDEX, TROPONINI in the last 8760 hours. BNP: No results for input(s): BNP in the last 8760 hours. CBG: Recent Labs    06/15/19 1657 06/15/19 2132 06/16/19 0634  GLUCAP 139* 134* 114*    Procedures and Imaging Studies During Stay: No results found.  Assessment/Plan:   Cellulitis right foot  Peripheral arterial disease, status post femoropopliteal bypass with graft  Status post amputation first and second toe right foot  Diabetes mellitus type 2  Coronary artery disease status post CABG and  unstable angina  Hypothyroidism   Patient is being discharged with the following home health services: OT and [facility]/PT/nursing  Patient is being discharged with the following durable medical equipment: Rolling walker, bedside commode  Patient has been advised to f/u with their PCP in 1-2 weeks to bring them up to date on their rehab stay.  Social services at facility was responsible for arranging this appointment.  Pt was provided with a 30 day supply of prescriptions for medications and refills must be obtained from their PCP.  For controlled substances, a more limited supply may be provided adequate until PCP appointment only.  Medications have been reconciled.  Time spent greater than 30 minutes;> 50% of time with patient was spent reviewing records, labs, tests and studies, counseling and developing plan of care  Hennie Duos MD

## 2019-07-05 ENCOUNTER — Other Ambulatory Visit: Payer: Self-pay | Admitting: *Deleted

## 2019-07-05 DIAGNOSIS — I739 Peripheral vascular disease, unspecified: Secondary | ICD-10-CM

## 2019-07-06 ENCOUNTER — Telehealth: Payer: Self-pay | Admitting: Emergency Medicine

## 2019-07-06 NOTE — Telephone Encounter (Signed)
Home Health Verbal Orders - Caller/Agency: [facility] Callback Number: [phone] Requesting OT/PT/Skilled Nursing/Social Work/Speech Therapy: Nursing  Frequency: 1x for 9 wks.

## 2019-07-07 ENCOUNTER — Ambulatory Visit: Payer: Medicare Other | Admitting: Emergency Medicine

## 2019-07-07 NOTE — Telephone Encounter (Signed)
Spoke with Museum/gallery conservator at [facility] and ok the verbal orders per Dr. Mitchel Honour

## 2019-07-07 NOTE — Telephone Encounter (Signed)
No objections.  Thanks.

## 2019-07-07 NOTE — Telephone Encounter (Signed)
Please Advise

## 2019-07-08 ENCOUNTER — Encounter: Payer: Self-pay | Admitting: Emergency Medicine

## 2019-07-11 DIAGNOSIS — S98111A Complete traumatic amputation of right great toe, initial encounter: Secondary | ICD-10-CM | POA: Insufficient documentation

## 2019-07-15 ENCOUNTER — Telehealth: Payer: Self-pay | Admitting: Emergency Medicine

## 2019-07-15 NOTE — Telephone Encounter (Signed)
CB required for verbal orders for physical therapy   Cecilia - at [phone]

## 2019-07-18 NOTE — Telephone Encounter (Signed)
Merleen Nicely called from [facility] in regard to verbal orders for patient. CB is [phone].  Lorna Few called last week in regard to this patient. She had a passing of of a family member. Please disregard her CB number

## 2019-07-19 NOTE — Telephone Encounter (Signed)
Jason [name] is calling back regarding needing verbal orders for this patient . Physical therapy for balance gate and strengthening 1X for 1 week ,x2 for 2 weeks   Lease call at  [phone]

## 2019-07-20 ENCOUNTER — Telehealth: Payer: Self-pay | Admitting: Emergency Medicine

## 2019-07-20 NOTE — Telephone Encounter (Signed)
Requesting verbal orders, physical therapy 1x wk (1 wk) / 2x 2wks  Please advise

## 2019-07-20 NOTE — Telephone Encounter (Signed)
Please advise 

## 2019-07-20 NOTE — Telephone Encounter (Signed)
I do not know this patient.  Who is his primary care provider?  Thanks.

## 2019-07-21 NOTE — Telephone Encounter (Signed)
You need to find more information about this patient.  Who is his PCP?  Who ordered physical therapy and for what reason?  Thanks.

## 2019-07-21 NOTE — Telephone Encounter (Signed)
Please Advise

## 2019-07-22 NOTE — Telephone Encounter (Signed)
Verbal Order Request  Name of [place]   Name of [place] Contact Number: [id number]  PCP [place], Kittie Plater PCP  On file for hh   Last OV: 10/27/2018 telemed with [facility] ([facility] pt )   What Orders are being requested: 1 visit for 1 week and 2 visits for 2 weeks     Shaunda [facility] manager  [phone] she will have the info for original PT order   Reason for call :  Pt will not be able to be given without verbal orders

## 2019-07-25 NOTE — Telephone Encounter (Signed)
Order was placed by the Hospital for evaluation and they needed to get approval for further treatment and that's why the home health office is reaching out to [place]. Verbal order has been given. I also sent a msg to the scheduling pool to get patient and f/u appt can be tele-med to reevaluation.

## 2019-07-27 ENCOUNTER — Telehealth: Payer: Self-pay | Admitting: *Deleted

## 2019-07-27 NOTE — Telephone Encounter (Signed)
Faxed signed certificate and plan of care to [facility] [phone]. Confirmation page 11:04 am.

## 2019-07-28 ENCOUNTER — Telehealth (HOSPITAL_COMMUNITY): Payer: Self-pay

## 2019-07-28 ENCOUNTER — Telehealth: Payer: Self-pay

## 2019-07-28 NOTE — Telephone Encounter (Signed)
Cecelia from [facility] called.  Patient was not aware of his appt on Fri 02/05, nor was he aware that he was supposed to use his heel only for weightbearing.  I advised Cecelia that we will be re-assessed tomorrow at his appt.    I called patient to make him aware of his appt as well.  Pt verbalized understanding.  Thurston Hole., LPN

## 2019-07-28 NOTE — Telephone Encounter (Signed)

## 2019-07-29 ENCOUNTER — Ambulatory Visit (HOSPITAL_COMMUNITY)
Admission: RE | Admit: 2019-07-29 | Discharge: 2019-07-29 | Disposition: A | Discharge status: Home / Self Care | Payer: Medicare Other | Source: Ambulatory Visit | Attending: Surgery | Admitting: Surgery

## 2019-07-29 ENCOUNTER — Ambulatory Visit (INDEPENDENT_AMBULATORY_CARE_PROVIDER_SITE_OTHER): Payer: Self-pay | Admitting: Vascular Surgery

## 2019-07-29 ENCOUNTER — Other Ambulatory Visit: Payer: Self-pay

## 2019-07-29 ENCOUNTER — Encounter: Payer: Self-pay | Admitting: Vascular Surgery

## 2019-07-29 VITALS — BP 137/75 | HR 90 | Temp 97.6°F | Resp 20 | Ht 69.0 in | Wt 152.0 lb

## 2019-07-29 DIAGNOSIS — I739 Peripheral vascular disease, unspecified: Secondary | ICD-10-CM

## 2019-07-29 NOTE — Progress Notes (Signed)
    Subjective:     Patient ID: Jason [name], male   DOB: 03/14/1951, 69 y.o.   MRN: [id number]  HPI 69 year old male follows up after right lower extremity bypass femoropopliteal with amputation of his first 2 toes.  He has been followed by wound care at home.  States overall he is doing well and he is walking now with regular shoes.  He continues to smoke.   Review of Systems No complaints today    Objective:   Physical Exam  Vitals:   07/29/19 1022  BP: 137/75  Pulse: 90  Resp: 20  Temp: 97.6 F (36.4 C)  SpO2: 100%   Right lower extremity incisions healed well Strong biphasic signals PT and DP on the right with only monophasic peroneal signal on the left    I have independently interpreted his ABIs to be 0.85 and biphasic on the right and dampened monophasic 0.24 on the left with great toe pressure of 0.     Assessment/plan     69 year old male status post right femoropopliteal bypass with toe amputations.  Has toe pressure of 0 on the left incredibly does not have significant rest pain does have claudication not walking enough to elicit this.  I recommended smoking cessation.  We will have him follow-up in 3 to 4 months with repeat ABIs.  I have recommended washing his right foot daily with soap and water and protecting the toes on both feet particular the left side given the severely depressed ABI and toe pressure of 0.  Patient will likely need repeat angiography and femoral to peroneal artery bypass in the future on the left side but at this time he states that his pain is manageable.        Franchelle Foskett C. Donzetta Matters, MD [facility] Office: [phone] Pager: [phone]

## 2019-08-01 ENCOUNTER — Other Ambulatory Visit: Payer: Self-pay | Admitting: *Deleted

## 2019-08-01 DIAGNOSIS — I739 Peripheral vascular disease, unspecified: Secondary | ICD-10-CM

## 2019-08-02 ENCOUNTER — Other Ambulatory Visit: Payer: Self-pay | Admitting: Internal Medicine

## 2019-08-08 ENCOUNTER — Telehealth: Payer: Self-pay | Admitting: *Deleted

## 2019-08-08 NOTE — Telephone Encounter (Signed)
Faxed signed orders Attn: Soil scientist. Confirmation page 12:55 pm.

## 2019-08-09 ENCOUNTER — Telehealth: Payer: Self-pay | Admitting: *Deleted

## 2019-08-09 NOTE — Telephone Encounter (Signed)
Faxed signed Attn: Biomedical engineer. Confirmation page 1:14 pm.

## 2019-08-15 ENCOUNTER — Telehealth: Payer: Self-pay | Admitting: *Deleted

## 2019-08-15 NOTE — Telephone Encounter (Signed)
Faxed signed PT Re-evaluation to [facility]. Confirmation at 12:18 pm.

## 2019-08-24 ENCOUNTER — Telehealth: Payer: Self-pay | Admitting: Emergency Medicine

## 2019-08-24 NOTE — Telephone Encounter (Signed)
Spoke with [employer] and ok the verbal orders that she was requesting for pt.

## 2019-08-24 NOTE — Telephone Encounter (Signed)
Amber from [facility] is needing verbal orders for pt-nursing for twice a week for 2 weeks and wound care HYDROSERA BLUE every other day for 2 wounds on one foot. Please advise at [phone].

## 2019-08-28 NOTE — Progress Notes (Signed)
Virtual Visit via Telephone Note   This visit type was conducted due to national recommendations for restrictions regarding the COVID-19 Pandemic (e.g. social distancing) in an effort to limit this patient's exposure and mitigate transmission in our community.  Due to his co-morbid illnesses, this patient is at least at moderate risk for complications without adequate follow up.  This format is felt to be most appropriate for this patient at this time.  The patient did not have access to video technology/had technical difficulties with video requiring transitioning to audio format only (telephone).  All issues noted in this document were discussed and addressed.  No physical exam could be performed with this format.  Please refer to the patient's chart for his  consent to telehealth for [facility].   The patient was identified using 2 identifiers.  Date:  08/29/2019   ID:  Jason [name], DOB Nov 02, 1950, MRN [id number]  Patient Location: Home Provider Location: Home  PCP:  Horald Pollen, MD  Cardiologist:  Shelva Majestic, MD  Electrophysiologist:  None   Evaluation Performed:  Follow-Up Visit  Chief Complaint:  Routine follow-up  History of Present Illness:    Jason [name] is a 69 y.o. male with PMH of CAD s/p CABG in 2011 (occluded SVG-PDA on cath in 2015 and subsequent PCI/DES to SVG-OM2 in 2018), PAD s/p right fem-pop bypass with graft and amputation of 1st/2nd toes 05/2019, carotid artery disease, HTN, HLD, DM type 2, and tobacco abuse, who presents for routine follow-up.   He was last evaluated by cardiology at an outpatient visit with myself 03/02/2019, at which time he wa doing okay form a cardiac standpoint with stable angina. His primary complaints at that visit were related to his chronic back and hand pain. His imdur was increased to 120mg  daily given regular SL nitro use. Additionally he was started on crestor 10mg  daily with plans to uptitrate based on tolerance  given prior sensitivities to atorvastatin. He was recommended to follow-up in 6 months.   He was hospitalized 05/2019 for a gangrenous right 1st toe. He underwent right fem-pop bypass with graft and amputation of the 1st and 2nd toes 06/02/2019. He had a subsequent admission later that month for cellulitis treated with IV>po antibiotics.   Since discharge from the hospital he reports he has slowly been recovering. He has been receiving home RN visits for wound care. He is quite debilitated from his hospitalizations and chronic back pain. He reports stable angina since his last visit. He notes his anxiety has been poorly managed which often contributes to his chest pain. He reports being on xanax in the past which helped his symptoms, though has not had a prescription for this in quite some time. He was encouraged to speak with his PCP and possibly obtain a referral to psych. He has been tolerating crestor 10mg  daily. No recent lipids on file, however his LDL was poorly controlled in 2019 at 205. No complaints of palpitations, dizziness, lightheadedness, syncope, or LE edema.    The patient does not have symptoms concerning for COVID-19 infection (fever, chills, cough, or new shortness of breath).    Past Medical History:  Diagnosis Date  . Anxiety    off xanax  and paxil since 3/13  . Arthritis   . Bilateral carotid artery disease ([place])    s/p R ICA stent 03/28/2015 with distal protection. Known chronically occluded L ICA. Carotid stent complicated by hypotension and acute stroke in [place]  . Cancer (  [place])    tumor basal cell rem from lft arm  . Cataract   . COPD (chronic obstructive pulmonary disease) ([place])   . Coronary artery disease    a. s/p CABG in 2011 with LIMA-LAD, SVG-RI/OM, and SVG-PDA b. occluded SVG-PDA by cath in 2015 c. 10/2016: NSTEMI with cath showing thrombus along the distal graft to insertion of SVG-OM2 with DES placed.   . CVA (cerebral infarction)    occured on  10/10 several days after R ICA carotid stenting  . Depression   . Diabetes mellitus   . GERD (gastroesophageal reflux disease)   . Heart attack ([place])   . High cholesterol   . Hypertension    type 2 NIDDM  . Myocardial infarct (H[facility])    x4 last 10 yrs  . Pneumonia    hx  . 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 Smoker   . Substance abuse [facility])    Past Surgical History:  Procedure Laterality Date  . ABDOMINAL AORTOGRAM W/LOWER EXTREMITY Bilateral 05/31/2019   Procedure: ABDOMINAL AORTOGRAM W/LOWER EXTREMITY;  Surgeon: Waynetta Sandy, MD;  Location: [place] CV LAB;  Service: Cardiovascular;  Laterality: Bilateral;  . AMPUTATION TOE Right 06/02/2019   Procedure: Amputation Right Great Toe and Second Toe;  Surgeon: Waynetta Sandy, MD;  Location: [place];  Service: Vascular;  Laterality: Right;  . BACK SURGERY  Jan 2014, Dec 2011   Dr Vertell Limber  . CARDIAC CATHETERIZATION  4/12   Medical Rx  . CORONARY ANGIOGRAM  01/17/14   med rx  . CORONARY ANGIOGRAM  4/13   med Rx  . CORONARY ANGIOGRAM  1/15   Med Rx  . CORONARY ANGIOPLASTY  Jan 2004   RCA  . CORONARY ARTERY BYPASS GRAFT  07/24/2009   L-LAD, SVG-RI/OM, SVG-PDA  . CORONARY STENT INTERVENTION N/A 10/23/2016   Procedure: Coronary Stent Intervention;  Surgeon: Peter M Martinique, MD;  Location: [place] CV LAB;  Service: Cardiovascular;  Laterality: N/A;  . ENDARTERECTOMY FEMORAL Right 06/02/2019   Procedure: Endarterectomy External Iliac  Femoral Artery and Profunda;  Surgeon: Waynetta Sandy, MD;  Location: [place];  Service: Vascular;  Laterality: Right;  . EYE SURGERY     cat bil  . FEMORAL ARTERY - FEMORAL ARTERY BYPASS GRAFT Right 2004   femoral enarterectomy  . FEMORAL-POPLITEAL BYPASS GRAFT Right 06/02/2019   Procedure: RIGHT LEG BYPASS GRAFT FEMORAL-POPLITEAL ARTERY using Gore Propaten Vascular Graft Removable Ring;  Surgeon: Waynetta Sandy, MD;   Location: [place];  Service: Vascular;  Laterality: Right;  . LEFT HEART CATH AND CORS/GRAFTS ANGIOGRAPHY N/A 10/23/2016   Procedure: Left Heart Cath and Cors/Grafts Angiography;  Surgeon: Peter M Martinique, MD;  Location: [place] CV LAB;  Service: Cardiovascular;  Laterality: N/A;  . LEFT HEART CATHETERIZATION WITH CORONARY ANGIOGRAM N/A 10/03/2011   Procedure: LEFT HEART CATHETERIZATION WITH CORONARY ANGIOGRAM;  Surgeon: Pixie Casino, MD;  Location: [facility] CATH LAB;  Service: Cardiovascular;  Laterality: N/A;  . LEFT HEART CATHETERIZATION WITH CORONARY ANGIOGRAM N/A 07/08/2013   Procedure: LEFT HEART CATHETERIZATION WITH CORONARY ANGIOGRAM;  Surgeon: Blane Ohara, MD;  Location: [facility] CATH LAB;  Service: Cardiovascular;  Laterality: N/A;  . LEFT HEART CATHETERIZATION WITH CORONARY/GRAFT ANGIOGRAM N/A 01/17/2014   Procedure: LEFT HEART CATHETERIZATION WITH Beatrix Fetters;  Surgeon: Troy Sine, MD;  Location: [facility] CATH LAB;  Service: Cardiovascular;  Laterality: N/A;  . PATCH ANGIOPLASTY Right 06/02/2019   Procedure: Patch Angioplasty  using Hemashield Platinum Finesse Patch of the External Iliac Femoral Artery and Profunda;  Surgeon: Waynetta Sandy, MD;  Location: [place];  Service: Vascular;  Laterality: Right;  . PERIPHERAL VASCULAR CATHETERIZATION N/A 03/28/2015   Procedure: Carotid PTA/Stent Intervention;  Surgeon: Lorretta Harp, MD;  Location: [place] CV LAB;  Service: Cardiovascular;  Laterality: N/A;  . US EXTREMITY*L*     lft arm tumor removed      Current Meds  Medication Sig  . aspirin 81 MG tablet Take 1 tablet (81 mg total) by mouth daily.  . clopidogrel (PLAVIX) 75 MG tablet Take 1 tablet (75 mg total) by mouth daily.  Marland Kitchen levothyroxine (SYNTHROID) 50 MCG tablet Take 1 tablet (50 mcg total) by mouth daily.  Marland Kitchen loratadine (CLARITIN) 10 MG tablet Take 10 mg by mouth daily.  . metoprolol succinate (TOPROL-XL) 25 MG 24 hr tablet Take 1 tablet (25 mg total) by mouth  daily.  . ranolazine (RANEXA) 1000 MG SR tablet TAKE 1 TABLET BY MOUTH 2 TIMES DAILY. NEED OV.  . rosuvastatin (CRESTOR) 20 MG tablet Take 1 tablet (20 mg total) by mouth daily.  . sertraline (ZOLOFT) 100 MG tablet Take 2 and 1/2 tablets by mouth daily  . sitaGLIPtin-metformin (JANUMET) 50-500 MG tablet Take 1 tablet by mouth 2 (two) times daily with a meal.  . [DISCONTINUED] rosuvastatin (CRESTOR) 10 MG tablet Take 1 tablet (10 mg total) by mouth daily.     Allergies:   Coreg [carvedilol] and Sulfa antibiotics   Social History   Tobacco Use  . Smoking status: Current Every Day Smoker    Packs/day: 1.00    Years: 48.00    Pack years: 48.00    Types: Cigarettes  . Smokeless tobacco: Never Used  Substance Use Topics  . Alcohol use: Yes    Alcohol/week: 0.0 standard drinks    Comment: socially  . Drug use: No    Comment: hx of cocaine use     Family Hx: The patient's family history includes Arthritis in his mother; Heart disease in his father.  ROS:   Please see the history of present illness.     All other systems reviewed and are negative.   Prior CV studies:   The following studies were reviewed today:  Echocardiogram 08/2016: Study Conclusions  - Left ventricle: The cavity size was normal. There was mild focal basal hypertrophy of the septum. Systolic function was normal. The estimated ejection fraction was in the range of 60% to 65%. Wall motion was normal; there were no regional wall motion abnormalities. There was an increased relative contribution of atrial contraction to ventricular filling. Doppler parameters are consistent with abnormal left ventricular relaxation (grade 1 diastolic dysfunction). - Aortic valve: Severe focal calcification involving the left coronary and noncoronary cusp. Noncoronary cusp mobility was severely restricted. - Mitral valve: Calcified annulus. - Atrial septum: There was increased thickness of the  septum, consistent with lipomatous hypertrophy. - Pulmonary arteries: Systolic pressure could not be accurately estimated.  Left heart catheterization 10/2016:  Prox RCA to Mid RCA lesion, 100 %stenosed.  Ost LAD to Dist LAD lesion, 75 %stenosed.  1st Diag lesion, 50 %stenosed.  Ost 1st Mrg to 1st Mrg lesion, 100 %stenosed.  Ost Cx to Prox Cx lesion, 99 %stenosed.  Ost 2nd Mrg to 2nd Mrg lesion, 100 %stenosed.  SVG.  Origin lesion, 100 %stenosed.  Seq SVG-.  LIMA graft was visualized by angiography and is normal in caliber and anatomically normal.  LV  end diastolic pressure is moderately elevated.  A STENT RESOLUTE ONYX 4.5X18 drug eluting stent was successfully placed.  Dist Graft to Insertion lesion, 95 %stenosed.  Post intervention, there is a 0% residual stenosis.  1. Severe 3 vessel obstructive CAD. 2. Patent LIMA to the LAD 3. Patent SVG sequentially to OM1 and OM2 but with critical thrombotic lesion in the body of the SVG 4. Occluded SVG to the RCA- chronic 5. Moderately elevated LVEDP 6. Successful stenting of SVG to OM 2. Procedure complicated by no reflow phenomena and occlusion of the first OM.   Plan: IV Ntg overnight. Analgesics. Check Echo tomorrow. Serial troponins. Continue DAPT indefinitely.    Labs/Other Tests and Data Reviewed:    EKG:  An ECG dated 06/01/2019 was personally reviewed today and demonstrated:  sinus rhythm with rate 89 bpm, frequent PVCs, chronic RBBB/LPFB, no STE/D  Recent Labs: 06/11/2019: TSH 6.423 06/12/2019: ALT 15; BUN 14; Creatinine, Ser 1.18; Hemoglobin 10.0; Magnesium 1.7; Platelets 355; Potassium 4.6; Sodium 130   Recent Lipid Panel Lab Results  Component Value Date/Time   CHOL 324 (H) 05/06/2018 04:40 PM   TRIG 391 (H) 05/06/2018 04:40 PM   HDL 41 05/06/2018 04:40 PM   CHOLHDL 7.9 (H) 05/06/2018 04:40 PM   CHOLHDL 6.1 10/23/2016 05:28 AM   LDLCALC 205 (H) 05/06/2018 04:40 PM    Wt Readings from Last 3  Encounters:  08/29/19 160 lb (72.6 kg)  07/29/19 152 lb (68.9 kg)  07/01/19 164 lb (74.4 kg)     Objective:    Vital Signs:  Ht 5\' 9"  (1.753 m)   Wt 160 lb (72.6 kg)   BMI 23.63 kg/m    VITAL SIGNS:  reviewed GEN:  no acute distress RESPIRATORY:  speaking in full sentences without SOB CARDIOVASCULAR:  no peripheral edema NEURO:  A&O x3 PSYCH:  normal affect  ASSESSMENT & PLAN:    1. CAD s/p CABG with subsequent PCI/DES to SVG to OM2 in 2018: patient reports stable angina relieved with SL nitro  - Continue aspirin, plavix, and statin - Continue metoprolol, imdur, and renexa  2. HTN: does not recall BP from this mornings home RN visit but BP is typically in the 110s-120s/60s. - Continue metoprolol  3. HLD: LDL poorly controlled at 205 on lipids 04/2018 with goal <70. Reported to have difficulty taking atorvastatin 2/2 side effects. He has been tolerating crestor 10mg  daily - Will increase crestor to 20mg  daily - Will attempt to have home RN draw FLP this week - If LDL is not at goal of <70 (this is unlikely given severely increased level in 2019), will send a referral to the lipid clinic for PSK9-I consideration  4. PAD: s/p right fem-pop bypass and amputation of 1st/2nd toes 06/02/19. He has ongoing wound care at home.  - Continue close monitoring per Dr. Donzetta Matters with [facility].   5. Carotid artery disease: known chronically occluded left ICA and stable 50-75% stenosis of the right ICA on last duplex 01/2019 - Plan to repeat doppler 01/2020  6. DM type 2:  A1C 8.5 05/2019; he reports A1C level check by [facility] last week was <7.  - Continue metformin  7. Tobacco abuse: still smoking. Doesn't think he'll be able to quit due to anxiety - Continue to encourage cessation  8. Anxiety: suspect this contributes to his occasional chest pain. He reports improvement with xanax in the past but has not had a prescription for quite some time.  - Recommend f/u with PCP to  discuss management of  his anxiety. Consider referral to psych    COVID-19 Education: The signs and symptoms of COVID-19 were discussed with the patient and how to seek care for testing (follow up with PCP or arrange E-visit).  The importance of social distancing was discussed today.  Time:   Today, I have spent 24 minutes with the patient with telehealth technology discussing the above problems.     Medication Adjustments/Labs and Tests Ordered: Current medicines are reviewed at length with the patient today.  Concerns regarding medicines are outlined above.   Tests Ordered: Orders Placed This Encounter  Procedures  . Lipid panel  . VAS US CAROTID    Medication Changes: Meds ordered this encounter  Medications  . nitroGLYCERIN (NITROSTAT) 0.4 MG SL tablet    Sig: PLACE 1 TABLET (0.4 MG TOTAL) UNDER THE TONGUE EVERY 5 (FIVE) MINUTES times 3 then call MD    Dispense:  25 tablet    Refill:  2  . rosuvastatin (CRESTOR) 20 MG tablet    Sig: Take 1 tablet (20 mg total) by mouth daily.    Dispense:  90 tablet    Refill:  3    Follow Up:  In Person in 6 month(s)  Signed, Abigail Butts, PA-C  08/29/2019 4:21 PM    [place]

## 2019-08-29 ENCOUNTER — Telehealth: Payer: Self-pay

## 2019-08-29 ENCOUNTER — Telehealth: Payer: Self-pay | Admitting: *Deleted

## 2019-08-29 ENCOUNTER — Encounter: Payer: Self-pay | Admitting: Medical

## 2019-08-29 ENCOUNTER — Telehealth (INDEPENDENT_AMBULATORY_CARE_PROVIDER_SITE_OTHER): Payer: Medicare Other | Admitting: Medical

## 2019-08-29 VITALS — Ht 69.0 in | Wt 160.0 lb

## 2019-08-29 DIAGNOSIS — I25118 Atherosclerotic heart disease of native coronary artery with other forms of angina pectoris: Secondary | ICD-10-CM | POA: Diagnosis not present

## 2019-08-29 DIAGNOSIS — I10 Essential (primary) hypertension: Secondary | ICD-10-CM

## 2019-08-29 DIAGNOSIS — E782 Mixed hyperlipidemia: Secondary | ICD-10-CM

## 2019-08-29 DIAGNOSIS — Z720 Tobacco use: Secondary | ICD-10-CM

## 2019-08-29 DIAGNOSIS — I739 Peripheral vascular disease, unspecified: Secondary | ICD-10-CM | POA: Diagnosis not present

## 2019-08-29 DIAGNOSIS — E1159 Type 2 diabetes mellitus with other circulatory complications: Secondary | ICD-10-CM

## 2019-08-29 DIAGNOSIS — I6523 Occlusion and stenosis of bilateral carotid arteries: Secondary | ICD-10-CM

## 2019-08-29 DIAGNOSIS — Z794 Long term (current) use of insulin: Secondary | ICD-10-CM

## 2019-08-29 MED ORDER — NITROGLYCERIN 0.4 MG SL SUBL: SUBLINGUAL_TABLET | SUBLINGUAL | 2 refills | Status: DC

## 2019-08-29 MED ORDER — ROSUVASTATIN CALCIUM 20 MG PO TABS: 20.0000 mg | ORAL_TABLET | Freq: Every day | ORAL | 3 refills | Status: DC

## 2019-08-29 NOTE — Patient Instructions (Signed)
Medication Instructions:   INCREASE CRESTOR to 20 mg daily  *If you need a refill on your cardiac medications before your next appointment, please call your pharmacy*  Lab Work: You will need to have labs (blood work) drawn on Friday:  FASTING LIPID PANEL-DO NOT EAT OR DRINK PAST MIDNIGHT.(OK TO [place])  If you have labs (blood work) drawn today and your tests are completely normal, you will receive your results only by: Marland Kitchen MyChart Message (if you have MyChart) OR . A paper copy in the mail If you have any lab test that is abnormal or we need to change your treatment, we will call you to review the results.  Testing/Procedures: Your physician has requested that you have a carotid duplex. This test is an ultrasound of the carotid arteries in your neck. It looks at blood flow through these arteries that supply the brain with blood. Allow one hour for this exam. There are no restrictions or special instructions. This test is performed at [place] AUGUST 2021  Follow-Up: At [facility], you and your health needs are our priority.  As part of our continuing mission to provide you with exceptional heart care, we have created designated Provider Care Teams.  These Care Teams include your primary Cardiologist (physician) and Advanced Practice Providers (APPs -  Physician Assistants and Nurse Practitioners) who all work together to provide you with the care you need, when you need it.  We recommend signing up for the patient portal called "MyChart".  Sign up information is provided on this After Visit Summary.  MyChart is used to connect with patients for Virtual Visits (Telemedicine).  Patients are able to view lab/test results, encounter notes, upcoming appointments, etc.  Non-urgent messages can be sent to your provider as well.   To learn more about what you can do with MyChart, go to [email or web address].    Your next appointment:   6  month(s)  The format for your next appointment:   In Person  Provider:   Roby Lofts, PA-C  Other Instructions

## 2019-08-29 NOTE — Telephone Encounter (Signed)
Faxed signed orders for wound care to [place]. Confirmation page 2:03 pm.

## 2019-08-29 NOTE — Telephone Encounter (Signed)

## 2019-09-02 ENCOUNTER — Telehealth: Payer: Self-pay | Admitting: Emergency Medicine

## 2019-09-02 NOTE — Telephone Encounter (Signed)
Called [facility] and gave her the verbal.

## 2019-09-02 NOTE — Telephone Encounter (Signed)
Home Health Verbal Orders - Caller/Agency: [phone] [place] Number: [phone] Requesting OT/PT/Skilled Nursing/Social Work/Speech Therapy: Skilled Nursing, Blood Work received as well  Frequency: 2 week 2 1 week 2

## 2019-09-05 ENCOUNTER — Telehealth: Payer: Self-pay | Admitting: *Deleted

## 2019-09-05 NOTE — Telephone Encounter (Signed)
On 09/01/2019, faxed signed orders to Attn: Orders Manager. Confirmation page 2:53 pm.

## 2019-09-21 ENCOUNTER — Other Ambulatory Visit: Payer: Self-pay | Admitting: Internal Medicine

## 2019-10-24 IMAGING — MR MR LUMBAR SPINE W/O CM
4 of 5 series · 25 of 48 positions shown · non-contrast
Comparison: Lumbar spine radiographs September 30, 2017

CLINICAL DATA: Recurrent low back pain radiating to buttock and
bilateral lower extremities with LEFT leg numbness. History of
lumbar spine for surgery 4 years ago.

EXAM:
MRI LUMBAR SPINE WITHOUT CONTRAST
TECHNIQUE: Multiplanar, multisequence MR imaging of the lumbar spine was
performed. No intravenous contrast was administered.

[Series 3: T2 · sagittal · 4.0mm · 0.55mm/px · 6 of 13 slices shown (1 of 2)]
[im 1/13]
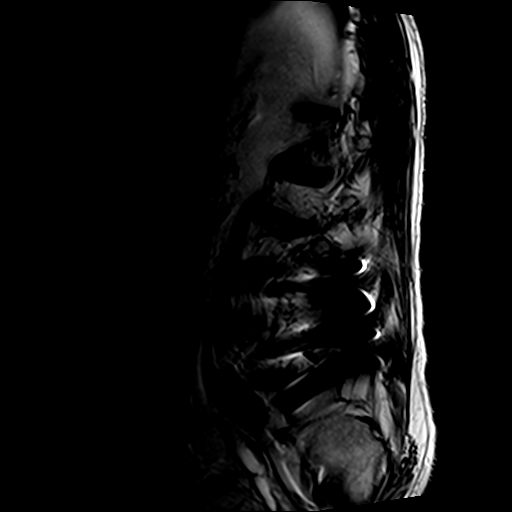
[im 3/13]
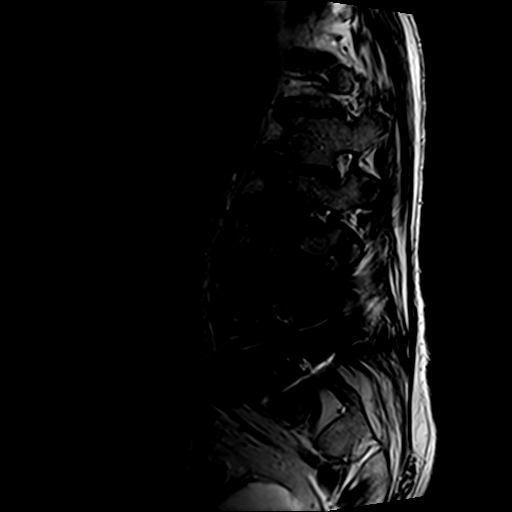
[im 5/13]
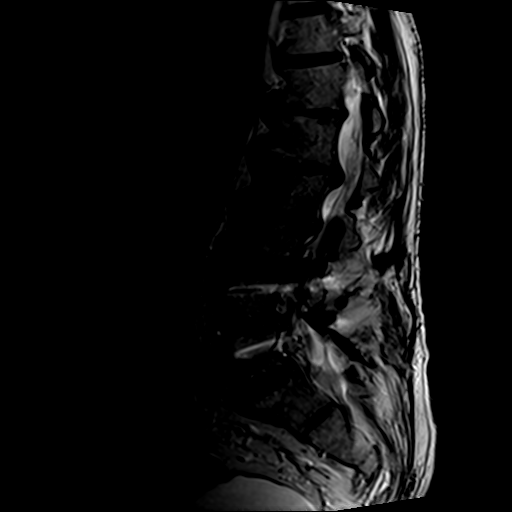
[im 8/13]
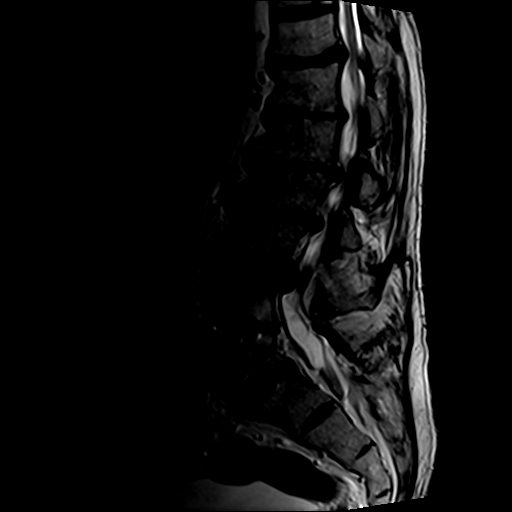
[im 10/13]
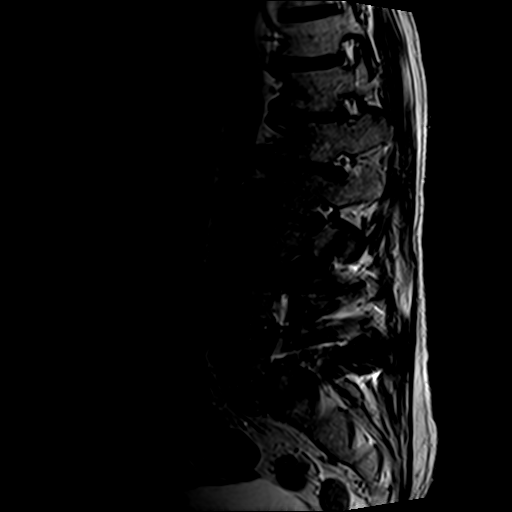
[im 13/13]
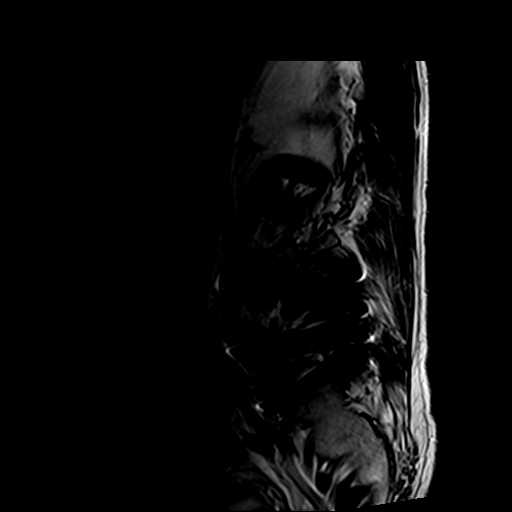

[Series 4: T1 · sagittal · 4.0mm · 0.55mm/px · 5 of 13 slices shown (1 of 2)]
[im 1/13]
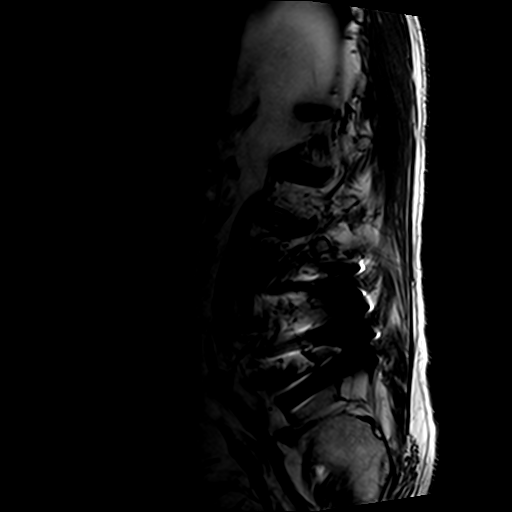
[im 4/13]
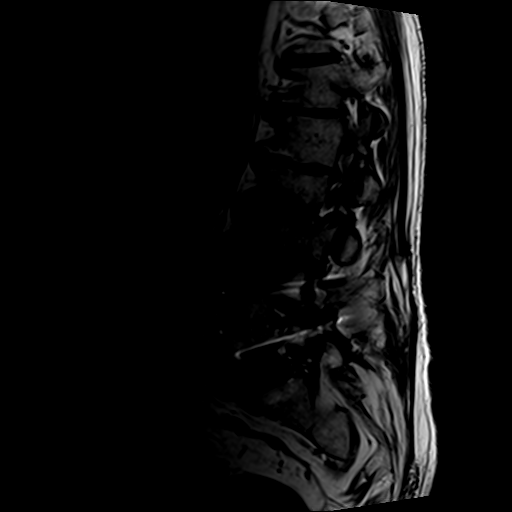
[im 7/13]
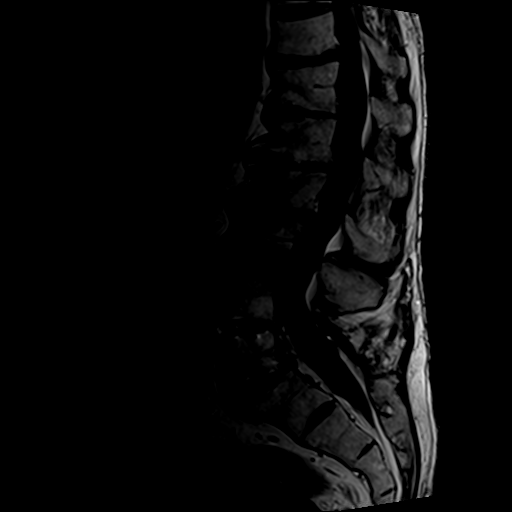
[im 10/13]
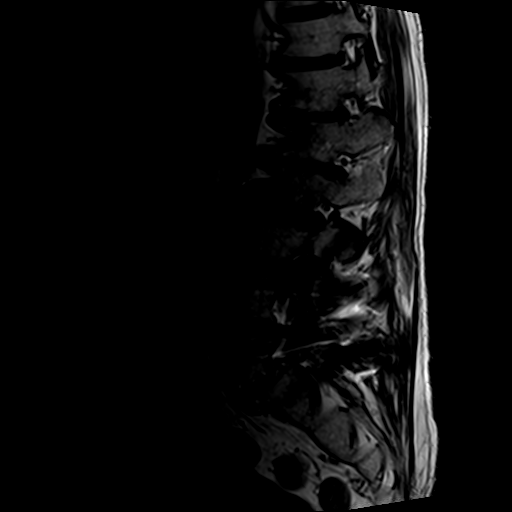
[im 13/13]
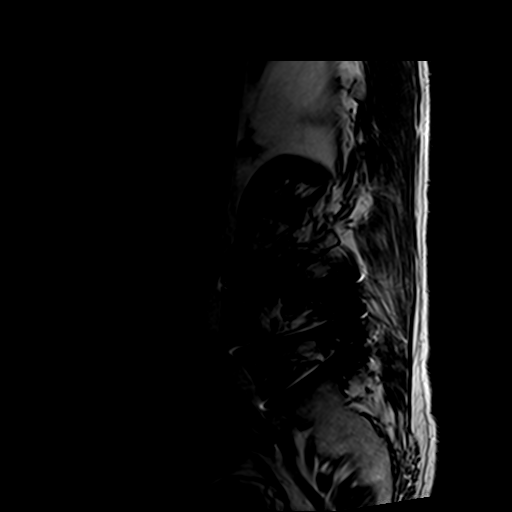

[Series 9: T2 · axial · 4.0mm · 0.70mm/px · z∈[-52,+146]mm · 10 of 40 slices shown (2 of 2)]
[im 3/40]
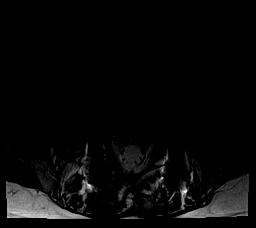
[im 6/40]
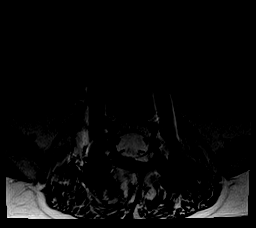
[im 8/40]
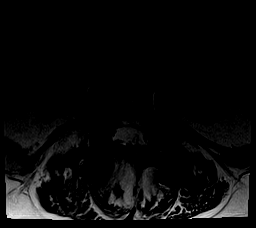
[im 14/40]
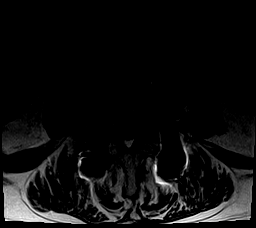
[im 19/40]
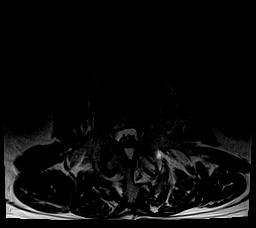
[im 21/40]
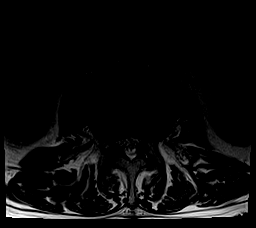
[im 24/40]
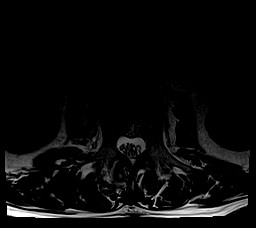
[im 29/40]
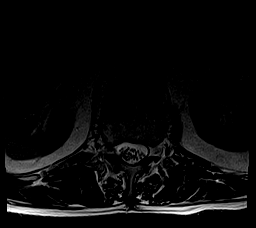
[im 34/40]
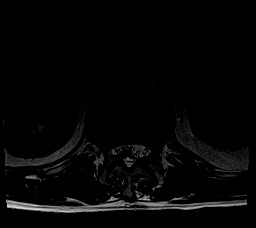
[im 40/40]
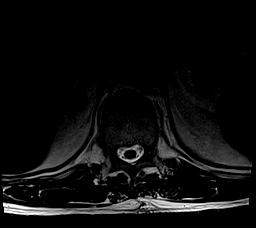

[Series 12: T1 · axial · 4.0mm · 0.35mm/px · z∈[-52,+129]mm · 4 of 40 slices shown (2 of 2)]
[im 3/40]
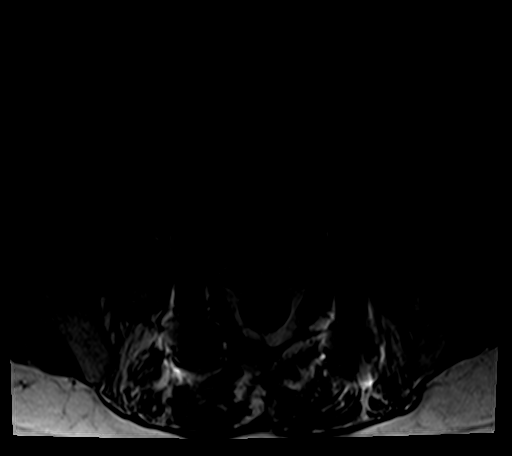
[im 6/40]
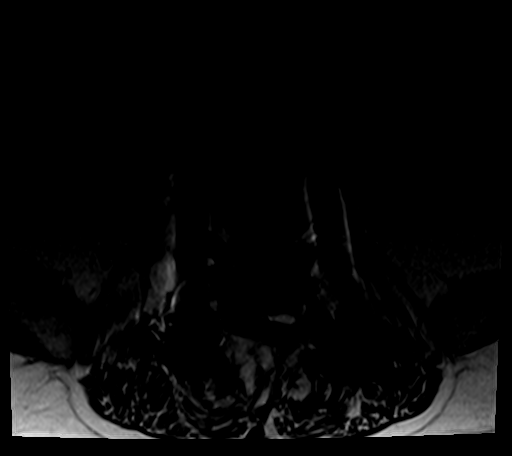
[im 21/40]
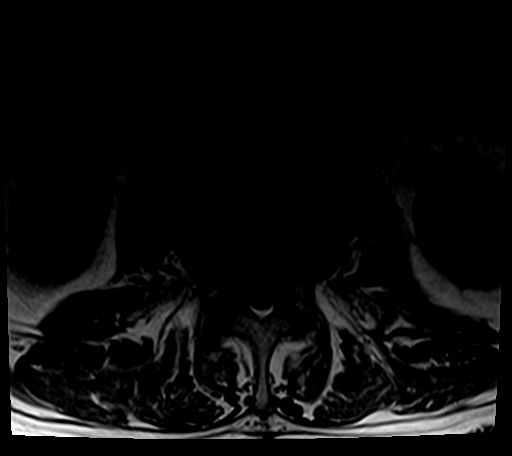
[im 34/40]
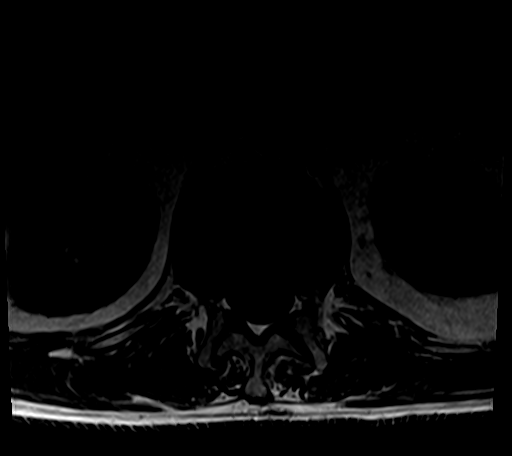

[25 of 48 positions shown; findings below may reference images not displayed]

FINDINGS: SEGMENTATION: For the purposes of this report, the last well-formed
intervertebral disc is reported as L5-S1.

ALIGNMENT: Maintained lumbar lordosis. Minimal grade 1 L2-3 and L3-4
retrolisthesis. Grade 1 L5-S1 anterolisthesis.

VERTEBRAE:Vertebral bodies are intact. Status post L4 through S1
PLIF, limited assessment for arthrodesis by MRI however no fluid
signal within the discs. Severe T12-L1 through L2-3 disc height loss
with moderate to severe chronic discogenic endplate changes, minimal
acute upon L2-3. Moderate to severe L3-4 disc height loss with
moderate acute discogenic endplate changes and focal disc edema.
Scattered old Schmorl's nodes.

CONUS MEDULLARIS AND CAUDA EQUINA: Conus medullaris terminates at L1
and demonstrates normal morphology and signal characteristics.
Central displacement of cauda equina due to canal stenosis.

PARASPINAL AND OTHER SOFT TISSUES: Nonacute. Paraspinal muscle
atrophy at and below the level surgical intervention.

DISC LEVELS:

T11-12: Small broad-based disc bulge, mild facet arthropathy.
Moderate canal stenosis. No neural foraminal narrowing.

T12-L1: Small broad-based disc bulge, mild facet arthropathy and
ligamentum flavum redundancy. No canal stenosis or neural foraminal
narrowing.

L1-2: 6 mm broad-based disc bulge asymmetric to the RIGHT could
affect the exited RIGHT L1 nerve. Mild facet arthropathy and
ligamentum flavum redundancy. Moderate canal stenosis. Mild to
moderate RIGHT neural foraminal narrowing.

L2-3: Retrolisthesis. Small broad-based disc bulge. Mild facet
arthropathy and ligamentum flavum redundancy. Mild canal stenosis.
Moderate LEFT greater than RIGHT neural foraminal narrowing.

L3-4: Retrolisthesis. 10 x 16 mm (AP by CC) central disc extrusion
with 8 mm superior and inferior contiguous migration.
Retrolisthesis. Severe canal stenosis, AP dimension of the thecal
sac is 7 mm. Lateral recess effacement displacing the traversing L4
nerves. Moderate to severe RIGHT, severe LEFT neural foraminal
narrowing.

L4-5: PLIF, posterior decompression without canal stenosis or neural
foraminal narrowing.

L5-S1: Anterolisthesis. PLIF. No canal stenosis. Severe RIGHT neural
foraminal narrowing may be overestimated by hardware artifact. Mild
LEFT neural foraminal narrowing.
IMPRESSION: 1. Status post L4 through S1 PLIF. Adjacent segment disease with
large L3-4 contiguous disc extrusion resulting in severe canal
stenosis and traversing L4 nerve impingement.
2. Multilevel grade 1 spondylolisthesis.
3. Moderate canal stenosis T11-12, L1-2.  Mild canal stenosis L2-3.
4. Multilevel neural foraminal narrowing: Severe on the LEFT at
L3-4.

## 2019-10-28 ENCOUNTER — Encounter (HOSPITAL_COMMUNITY): Payer: Medicare Other

## 2019-10-28 ENCOUNTER — Ambulatory Visit: Payer: Medicare Other

## 2019-10-28 ENCOUNTER — Telehealth: Payer: Self-pay | Admitting: Cardiovascular Disease

## 2019-10-28 NOTE — Telephone Encounter (Signed)
*  STAT* If patient is at the pharmacy, call can be transferred to refill team.   1. Which medications need to be refilled? (please list name of each medication and dose if known)  sitaGLIPtin-metformin (JANUMET) 50-500 MG tablet ranolazine (RANEXA) 1000 MG SR tablet  2. Which pharmacy/location (including street and city if local pharmacy) is medication to be sent to?   [id number] - [place], [place] - [address] AT [address]  3. Do they need a 30 day or 90 day supply? 30   Pt is aware that [name] has not written the rx for Janumet in the past and [name] may not write the new rx. The patient has had a hard time getting the medications from his PCP. The patient has been out of the [place] and does not want to loose another part of his limb

## 2019-11-01 ENCOUNTER — Telehealth: Payer: Self-pay | Admitting: *Deleted

## 2019-11-01 MED ORDER — RANOLAZINE ER 1000 MG PO TB12: ORAL_TABLET | ORAL | 4 refills | Status: DC

## 2019-11-01 NOTE — Telephone Encounter (Signed)
Ideally, the MD or provider's practice who wrote the prescription should renew the janumet. However, if unable ok to renew .

## 2019-11-01 NOTE — Telephone Encounter (Signed)
Faxed signed orders to [place]. Confirmation page 12:35 pm.

## 2019-11-04 MED ORDER — JANUMET 50-500 MG PO TABS: 1.0000 | ORAL_TABLET | Freq: Two times a day (BID) | ORAL | 0 refills | Status: DC

## 2019-11-17 ENCOUNTER — Other Ambulatory Visit: Payer: Self-pay | Admitting: *Deleted

## 2019-11-17 DIAGNOSIS — I739 Peripheral vascular disease, unspecified: Secondary | ICD-10-CM

## 2019-11-22 ENCOUNTER — Ambulatory Visit: Payer: Medicare Other

## 2019-11-22 ENCOUNTER — Inpatient Hospital Stay (HOSPITAL_COMMUNITY): Admission: RE | Admit: 2019-11-22 | Payer: Medicare Other | Source: Ambulatory Visit

## 2019-11-25 ENCOUNTER — Inpatient Hospital Stay (HOSPITAL_BASED_OUTPATIENT_CLINIC_OR_DEPARTMENT_OTHER)
Admission: EM | Admit: 2019-11-25 | Discharge: 2019-12-08 | DRG: 252 | Disposition: A | Discharge status: Skilled Nursing Facility | Payer: Medicare Other | Attending: Internal Medicine | Admitting: Internal Medicine

## 2019-11-25 ENCOUNTER — Telehealth: Payer: Self-pay | Admitting: Vascular Surgery

## 2019-11-25 ENCOUNTER — Emergency Department (HOSPITAL_BASED_OUTPATIENT_CLINIC_OR_DEPARTMENT_OTHER): Payer: Medicare Other

## 2019-11-25 ENCOUNTER — Other Ambulatory Visit: Payer: Self-pay

## 2019-11-25 ENCOUNTER — Encounter (HOSPITAL_BASED_OUTPATIENT_CLINIC_OR_DEPARTMENT_OTHER): Payer: Self-pay

## 2019-11-25 DIAGNOSIS — L97529 Non-pressure chronic ulcer of other part of left foot with unspecified severity: Secondary | ICD-10-CM | POA: Diagnosis present

## 2019-11-25 DIAGNOSIS — Z89421 Acquired absence of other right toe(s): Secondary | ICD-10-CM

## 2019-11-25 DIAGNOSIS — I70234 Atherosclerosis of native arteries of right leg with ulceration of heel and midfoot: Secondary | ICD-10-CM | POA: Diagnosis not present

## 2019-11-25 DIAGNOSIS — E7800 Pure hypercholesterolemia, unspecified: Secondary | ICD-10-CM | POA: Diagnosis present

## 2019-11-25 DIAGNOSIS — I7772 Dissection of iliac artery: Secondary | ICD-10-CM | POA: Diagnosis present

## 2019-11-25 DIAGNOSIS — Z8261 Family history of arthritis: Secondary | ICD-10-CM

## 2019-11-25 DIAGNOSIS — E1142 Type 2 diabetes mellitus with diabetic polyneuropathy: Secondary | ICD-10-CM | POA: Diagnosis present

## 2019-11-25 DIAGNOSIS — Z8673 Personal history of transient ischemic attack (TIA), and cerebral infarction without residual deficits: Secondary | ICD-10-CM

## 2019-11-25 DIAGNOSIS — Z20822 Contact with and (suspected) exposure to covid-19: Secondary | ICD-10-CM | POA: Diagnosis present

## 2019-11-25 DIAGNOSIS — I9589 Other hypotension: Secondary | ICD-10-CM | POA: Diagnosis not present

## 2019-11-25 DIAGNOSIS — E872 Acidosis: Secondary | ICD-10-CM | POA: Diagnosis not present

## 2019-11-25 DIAGNOSIS — E11621 Type 2 diabetes mellitus with foot ulcer: Secondary | ICD-10-CM | POA: Diagnosis present

## 2019-11-25 DIAGNOSIS — F329 Major depressive disorder, single episode, unspecified: Secondary | ICD-10-CM | POA: Diagnosis present

## 2019-11-25 DIAGNOSIS — S92911A Unspecified fracture of right toe(s), initial encounter for closed fracture: Secondary | ICD-10-CM

## 2019-11-25 DIAGNOSIS — L97429 Non-pressure chronic ulcer of left heel and midfoot with unspecified severity: Secondary | ICD-10-CM | POA: Diagnosis present

## 2019-11-25 DIAGNOSIS — I739 Peripheral vascular disease, unspecified: Secondary | ICD-10-CM | POA: Diagnosis present

## 2019-11-25 DIAGNOSIS — Z720 Tobacco use: Secondary | ICD-10-CM | POA: Diagnosis not present

## 2019-11-25 DIAGNOSIS — Z79899 Other long term (current) drug therapy: Secondary | ICD-10-CM

## 2019-11-25 DIAGNOSIS — E785 Hyperlipidemia, unspecified: Secondary | ICD-10-CM | POA: Diagnosis present

## 2019-11-25 DIAGNOSIS — Z9889 Other specified postprocedural states: Secondary | ICD-10-CM | POA: Diagnosis not present

## 2019-11-25 DIAGNOSIS — Z66 Do not resuscitate: Secondary | ICD-10-CM | POA: Diagnosis present

## 2019-11-25 DIAGNOSIS — D638 Anemia in other chronic diseases classified elsewhere: Secondary | ICD-10-CM | POA: Diagnosis present

## 2019-11-25 DIAGNOSIS — S81809A Unspecified open wound, unspecified lower leg, initial encounter: Secondary | ICD-10-CM | POA: Diagnosis not present

## 2019-11-25 DIAGNOSIS — E871 Hypo-osmolality and hyponatremia: Secondary | ICD-10-CM | POA: Diagnosis present

## 2019-11-25 DIAGNOSIS — I10 Essential (primary) hypertension: Secondary | ICD-10-CM | POA: Diagnosis present

## 2019-11-25 DIAGNOSIS — Z7902 Long term (current) use of antithrombotics/antiplatelets: Secondary | ICD-10-CM

## 2019-11-25 DIAGNOSIS — I959 Hypotension, unspecified: Secondary | ICD-10-CM | POA: Diagnosis present

## 2019-11-25 DIAGNOSIS — N179 Acute kidney failure, unspecified: Secondary | ICD-10-CM | POA: Diagnosis present

## 2019-11-25 DIAGNOSIS — S92501A Displaced unspecified fracture of right lesser toe(s), initial encounter for closed fracture: Secondary | ICD-10-CM | POA: Diagnosis present

## 2019-11-25 DIAGNOSIS — S91209A Unspecified open wound of unspecified toe(s) with damage to nail, initial encounter: Secondary | ICD-10-CM | POA: Diagnosis present

## 2019-11-25 DIAGNOSIS — E861 Hypovolemia: Secondary | ICD-10-CM | POA: Diagnosis present

## 2019-11-25 DIAGNOSIS — E538 Deficiency of other specified B group vitamins: Secondary | ICD-10-CM | POA: Diagnosis present

## 2019-11-25 DIAGNOSIS — I252 Old myocardial infarction: Secondary | ICD-10-CM

## 2019-11-25 DIAGNOSIS — E08621 Diabetes mellitus due to underlying condition with foot ulcer: Secondary | ICD-10-CM | POA: Diagnosis not present

## 2019-11-25 DIAGNOSIS — S92514A Nondisplaced fracture of proximal phalanx of right lesser toe(s), initial encounter for closed fracture: Secondary | ICD-10-CM | POA: Diagnosis not present

## 2019-11-25 DIAGNOSIS — L97509 Non-pressure chronic ulcer of other part of unspecified foot with unspecified severity: Secondary | ICD-10-CM | POA: Diagnosis not present

## 2019-11-25 DIAGNOSIS — Z89411 Acquired absence of right great toe: Secondary | ICD-10-CM

## 2019-11-25 DIAGNOSIS — E039 Hypothyroidism, unspecified: Secondary | ICD-10-CM | POA: Diagnosis present

## 2019-11-25 DIAGNOSIS — Z882 Allergy status to sulfonamides status: Secondary | ICD-10-CM

## 2019-11-25 DIAGNOSIS — F17210 Nicotine dependence, cigarettes, uncomplicated: Secondary | ICD-10-CM | POA: Diagnosis present

## 2019-11-25 DIAGNOSIS — J449 Chronic obstructive pulmonary disease, unspecified: Secondary | ICD-10-CM | POA: Diagnosis present

## 2019-11-25 DIAGNOSIS — Z8701 Personal history of pneumonia (recurrent): Secondary | ICD-10-CM

## 2019-11-25 DIAGNOSIS — Z8249 Family history of ischemic heart disease and other diseases of the circulatory system: Secondary | ICD-10-CM

## 2019-11-25 DIAGNOSIS — K219 Gastro-esophageal reflux disease without esophagitis: Secondary | ICD-10-CM | POA: Diagnosis present

## 2019-11-25 DIAGNOSIS — E1151 Type 2 diabetes mellitus with diabetic peripheral angiopathy without gangrene: Principal | ICD-10-CM | POA: Diagnosis present

## 2019-11-25 DIAGNOSIS — Z888 Allergy status to other drugs, medicaments and biological substances status: Secondary | ICD-10-CM

## 2019-11-25 DIAGNOSIS — D72829 Elevated white blood cell count, unspecified: Secondary | ICD-10-CM

## 2019-11-25 DIAGNOSIS — E611 Iron deficiency: Secondary | ICD-10-CM | POA: Diagnosis present

## 2019-11-25 DIAGNOSIS — M1990 Unspecified osteoarthritis, unspecified site: Secondary | ICD-10-CM | POA: Diagnosis present

## 2019-11-25 DIAGNOSIS — I70229 Atherosclerosis of native arteries of extremities with rest pain, unspecified extremity: Secondary | ICD-10-CM

## 2019-11-25 DIAGNOSIS — Z7982 Long term (current) use of aspirin: Secondary | ICD-10-CM

## 2019-11-25 DIAGNOSIS — I2510 Atherosclerotic heart disease of native coronary artery without angina pectoris: Secondary | ICD-10-CM | POA: Diagnosis present

## 2019-11-25 DIAGNOSIS — L8990 Pressure ulcer of unspecified site, unspecified stage: Secondary | ICD-10-CM | POA: Insufficient documentation

## 2019-11-25 DIAGNOSIS — Z85828 Personal history of other malignant neoplasm of skin: Secondary | ICD-10-CM

## 2019-11-25 DIAGNOSIS — L89311 Pressure ulcer of right buttock, stage 1: Secondary | ICD-10-CM | POA: Diagnosis present

## 2019-11-25 DIAGNOSIS — I25118 Atherosclerotic heart disease of native coronary artery with other forms of angina pectoris: Secondary | ICD-10-CM | POA: Diagnosis not present

## 2019-11-25 DIAGNOSIS — I998 Other disorder of circulatory system: Secondary | ICD-10-CM | POA: Diagnosis not present

## 2019-11-25 DIAGNOSIS — E034 Atrophy of thyroid (acquired): Secondary | ICD-10-CM | POA: Diagnosis not present

## 2019-11-25 DIAGNOSIS — Z955 Presence of coronary angioplasty implant and graft: Secondary | ICD-10-CM

## 2019-11-25 DIAGNOSIS — I70221 Atherosclerosis of native arteries of extremities with rest pain, right leg: Secondary | ICD-10-CM | POA: Diagnosis present

## 2019-11-25 DIAGNOSIS — Z79890 Hormone replacement therapy (postmenopausal): Secondary | ICD-10-CM

## 2019-11-25 DIAGNOSIS — E119 Type 2 diabetes mellitus without complications: Secondary | ICD-10-CM

## 2019-11-25 LAB — CBC WITH DIFFERENTIAL/PLATELET
Abs Immature Granulocytes: 0.08 10*3/uL — ABNORMAL HIGH (ref 0.00–0.07)
Basophils Absolute: 0 10*3/uL (ref 0.0–0.1)
Basophils Relative: 0 %
Eosinophils Absolute: 0.2 10*3/uL (ref 0.0–0.5)
Eosinophils Relative: 2 %
HCT: 34.7 % — ABNORMAL LOW (ref 39.0–52.0)
Hemoglobin: 11.9 g/dL — ABNORMAL LOW (ref 13.0–17.0)
Immature Granulocytes: 1 %
Lymphocytes Relative: 13 %
Lymphs Abs: 1.5 10*3/uL (ref 0.7–4.0)
MCH: 32.4 pg (ref 26.0–34.0)
MCHC: 34.3 g/dL (ref 30.0–36.0)
MCV: 94.6 fL (ref 80.0–100.0)
Monocytes Absolute: 0.9 10*3/uL (ref 0.1–1.0)
Monocytes Relative: 8 %
Neutro Abs: 8.3 10*3/uL — ABNORMAL HIGH (ref 1.7–7.7)
Neutrophils Relative %: 76 %
Platelets: 228 10*3/uL (ref 150–400)
RBC: 3.67 MIL/uL — ABNORMAL LOW (ref 4.22–5.81)
RDW: 13.7 % (ref 11.5–15.5)
WBC: 10.9 10*3/uL — ABNORMAL HIGH (ref 4.0–10.5)
nRBC: 0 % (ref 0.0–0.2)

## 2019-11-25 LAB — SARS CORONAVIRUS 2 BY RT PCR (HOSPITAL ORDER, PERFORMED IN ~~LOC~~ HOSPITAL LAB): SARS Coronavirus 2: NEGATIVE

## 2019-11-25 LAB — BASIC METABOLIC PANEL
Anion gap: 11 (ref 5–15)
BUN: 23 mg/dL (ref 8–23)
CO2: 22 mmol/L (ref 22–32)
Calcium: 9.5 mg/dL (ref 8.9–10.3)
Chloride: 96 mmol/L — ABNORMAL LOW (ref 98–111)
Creatinine, Ser: 1.29 mg/dL — ABNORMAL HIGH (ref 0.61–1.24)
GFR calc Af Amer: >60 mL/min (ref >60)
GFR calc non Af Amer: 57 mL/min — ABNORMAL LOW (ref >60)
Glucose, Bld: 198 mg/dL — ABNORMAL HIGH (ref 70–99)
Potassium: 4.1 mmol/L (ref 3.5–5.1)
Sodium: 129 mmol/L — ABNORMAL LOW (ref 135–145)

## 2019-11-25 LAB — CBG MONITORING, ED: Glucose-Capillary: 138 mg/dL — ABNORMAL HIGH (ref 70–99)

## 2019-11-25 LAB — LACTIC ACID, PLASMA: Lactic Acid, Venous: 2.1 mmol/L (ref 0.5–1.9)

## 2019-11-25 MED ORDER — FENTANYL CITRATE (PF) 100 MCG/2ML IJ SOLN
100.0000 ug | Freq: Once | INTRAMUSCULAR | Status: AC
  Administered 2019-11-25: 100 ug via INTRAVENOUS
  Filled 2019-11-25: qty 2

## 2019-11-25 MED ORDER — SITAGLIPTIN PHOS-METFORMIN HCL 50-500 MG PO TABS: 1.0000 | ORAL_TABLET | Freq: Two times a day (BID) | ORAL | Status: DC

## 2019-11-25 MED ORDER — LEVOTHYROXINE SODIUM 50 MCG PO TABS
50.0000 ug | ORAL_TABLET | Freq: Every day | ORAL | Status: DC
  Administered 2019-11-26 – 2019-12-08 (×11): 50 ug via ORAL
  Filled 2019-11-25 (×11): qty 1

## 2019-11-25 MED ORDER — SERTRALINE HCL 100 MG PO TABS
100.0000 mg | ORAL_TABLET | Freq: Every day | ORAL | Status: DC
  Administered 2019-11-26 – 2019-12-08 (×11): 100 mg via ORAL
  Filled 2019-11-25 (×11): qty 1

## 2019-11-25 MED ORDER — SODIUM CHLORIDE 0.9 % IV BOLUS
1000.0000 mL | Freq: Once | INTRAVENOUS | Status: AC
  Administered 2019-11-25: 1000 mL via INTRAVENOUS

## 2019-11-25 MED ORDER — METOPROLOL SUCCINATE ER 25 MG PO TB24: 25.0000 mg | ORAL_TABLET | Freq: Every day | ORAL | Status: DC

## 2019-11-25 MED ORDER — CLOPIDOGREL BISULFATE 75 MG PO TABS
75.0000 mg | ORAL_TABLET | Freq: Every day | ORAL | Status: DC
  Administered 2019-11-26 – 2019-12-08 (×11): 75 mg via ORAL
  Filled 2019-11-25 (×11): qty 1

## 2019-11-25 MED ORDER — RANOLAZINE ER 500 MG PO TB12
1000.0000 mg | ORAL_TABLET | Freq: Two times a day (BID) | ORAL | Status: DC
  Administered 2019-11-26 – 2019-12-08 (×24): 1000 mg via ORAL
  Filled 2019-11-25 (×26): qty 2

## 2019-11-25 MED ORDER — ROSUVASTATIN CALCIUM 20 MG PO TABS
20.0000 mg | ORAL_TABLET | Freq: Every day | ORAL | Status: DC
  Administered 2019-11-26 – 2019-12-08 (×11): 20 mg via ORAL
  Filled 2019-11-25 (×11): qty 1

## 2019-11-25 MED ORDER — ASPIRIN EC 81 MG PO TBEC
81.0000 mg | DELAYED_RELEASE_TABLET | Freq: Every day | ORAL | Status: DC
  Administered 2019-11-26 – 2019-12-08 (×11): 81 mg via ORAL
  Filled 2019-11-25 (×11): qty 1

## 2019-11-25 NOTE — ED Provider Notes (Signed)
[name] EMERGENCY DEPARTMENT Provider Note   CSN: [id number] Arrival date & time: 11/25/19  1628     History Chief Complaint  Patient presents with  . Leg Pain    Jason [name] is a 69 y.o. male.  69yo M w/ PMH including PVD s/p R fem-pop bypass, COPD, CAD s/p CABG, T2DM, HTN, HLD who p/w leg pain.  Patient had bypass surgery on his right leg in December and states that his vascular surgeon told him that he had disease on his left but they were trying to hold off on surgery.  He notes that over the past week he has had progressively worsening, constant pain in his legs especially in his left leg.  Pain is present at rest and gets much worse if he tries to walk.  He has chronic ulcers on his feet and does his own wound care.  He denies any fevers.  The history is provided by the patient.  Leg Pain      Past Medical History:  Diagnosis Date  . Anxiety    off xanax  and paxil since 3/13  . Arthritis   . Bilateral carotid artery disease ([place])    s/p R ICA stent 03/28/2015 with distal protection. Known chronically occluded L ICA. Carotid stent complicated by hypotension and acute stroke [place]  . Cancer ([place])    tumor basal cell rem from lft arm  . Cataract   . COPD (chronic obstructive pulmonary disease) ([place])   . Coronary artery disease    a. s/p CABG in 2011 with LIMA-LAD, SVG-RI/OM, and SVG-PDA b. occluded SVG-PDA by cath in 2015 c. 10/2016: NSTEMI with cath showing thrombus along the distal graft to insertion of SVG-OM2 with DES placed.   . CVA (cerebral infarction)    occured on 10/10 several days after R ICA carotid stenting  . Depression   . Diabetes mellitus   . GERD (gastroesophageal reflux disease)   . Heart attack ([place])   . High cholesterol   . Hypertension    type 2 NIDDM  . Myocardial infarct (H[facility])    x4 last 10 yrs  . Pneumonia    hx  . 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 Smoker   .  Substance abuse [facility])     Patient Active Problem List   Diagnosis Date Noted  . Amputation of right great toe ([place]) 07/11/2019  . Nicotine abuse 06/12/2019  . Cellulitis of right foot 06/11/2019  . Diabetic foot infection ([place]) 06/11/2019  . PAD (peripheral artery disease) ([place]) 06/11/2019  . Cellulitis 05/27/2019  . Gangrene of toe ([place]) 05/27/2019  . Status post lumbar spinal fusion 10/27/2017  . Acute on chronic combined systolic and diastolic CHF (congestive heart failure) ([place]) 10/25/2016  . NSTEMI (non-ST elevated myocardial infarction) ([place]) 10/22/2016  . Hypothyroidism 08/17/2015  . RBBB 05/11/2015  . Hyponatremia 04/04/2015  . Hypotension 04/04/2015  . Hemispheric carotid artery syndrome   . Carotid artery narrowing 03/28/2015  . Carotid stenosis- moderate 2011, 95% 2016 s/p stent 02/21/2014  . Unstable angina ([place]) 01/16/2014  . Tobacco abuse 01/16/2014  . Substance abuse in remission ([place]) 10/01/2012  . Radiculitis 02/10/2012  . CAD, CABG Feb 2011, cath x 4 since-medical Rx 10/03/2011  . PVD, hx Rt femoral endarterectomy 2004 10/03/2011  . DM (diabetes mellitus) ([place]) 10/02/2011  . HTN (hypertension) 10/02/2011  . Hyperlipidemia 10/02/2011    Past Surgical History:  Procedure Laterality Date  .  ABDOMINAL AORTOGRAM W/LOWER EXTREMITY Bilateral 05/31/2019   Procedure: ABDOMINAL AORTOGRAM W/LOWER EXTREMITY;  Surgeon: Waynetta Sandy, MD;  Location: [place] [facility];  Service: Cardiovascular;  Laterality: Bilateral;  . AMPUTATION TOE Right 06/02/2019   Procedure: Amputation Right Great Toe and Second Toe;  Surgeon: Waynetta Sandy, MD;  Location: [place];  Service: Vascular;  Laterality: Right;  . BACK SURGERY  Jan 2014, Dec 2011   Dr Vertell Limber  . CARDIAC CATHETERIZATION  4/12   Medical Rx  . CORONARY ANGIOGRAM  01/17/14   med rx  . CORONARY ANGIOGRAM  4/13   med Rx  . CORONARY ANGIOGRAM  1/15   Med Rx  . CORONARY ANGIOPLASTY  Jan 2004   RCA  .  CORONARY ARTERY BYPASS GRAFT  07/24/2009   L-LAD, SVG-RI/OM, SVG-PDA  . CORONARY STENT INTERVENTION N/A 10/23/2016   Procedure: Coronary Stent Intervention;  Surgeon: Peter M Martinique, MD;  Location: [place] CV LAB;  Service: Cardiovascular;  Laterality: N/A;  . ENDARTERECTOMY FEMORAL Right 06/02/2019   Procedure: Endarterectomy External Iliac  Femoral Artery and Profunda;  Surgeon: Waynetta Sandy, MD;  Location: [place];  Service: Vascular;  Laterality: Right;  . EYE SURGERY     cat bil  . FEMORAL ARTERY - FEMORAL ARTERY BYPASS GRAFT Right 2004   femoral enarterectomy  . FEMORAL-POPLITEAL BYPASS GRAFT Right 06/02/2019   Procedure: RIGHT LEG BYPASS GRAFT FEMORAL-POPLITEAL ARTERY using Gore Propaten Vascular Graft Removable Ring;  Surgeon: Waynetta Sandy, MD;  Location: [place];  Service: Vascular;  Laterality: Right;  . LEFT HEART CATH AND CORS/GRAFTS ANGIOGRAPHY N/A 10/23/2016   Procedure: Left Heart Cath and Cors/Grafts Angiography;  Surgeon: Peter M Martinique, MD;  Location: [place] CV LAB;  Service: Cardiovascular;  Laterality: N/A;  . LEFT HEART CATHETERIZATION WITH CORONARY ANGIOGRAM N/A 10/03/2011   Procedure: LEFT HEART CATHETERIZATION WITH CORONARY ANGIOGRAM;  Surgeon: Pixie Casino, MD;  Location[facility] CATH LAB;  Service: Cardiovascular;  Laterality: N/A;  . LEFT HEART CATHETERIZATION WITH CORONARY ANGIOGRAM N/A 07/08/2013   Procedure: LEFT HEART CATHETERIZATION WITH CORONARY ANGIOGRAM;  Surgeon: Blane Ohara, MD;  Location: [facility] CATH LAB;  Service: Cardiovascular;  Laterality: N/A;  . LEFT HEART CATHETERIZATION WITH CORONARY/GRAFT ANGIOGRAM N/A 01/17/2014   Procedure: LEFT HEART CATHETERIZATION WITH Beatrix Fetters;  Surgeon: Troy Sine, MD;  Location: [facility] CATH LAB;  Service: Cardiovascular;  Laterality: N/A;  . PATCH ANGIOPLASTY Right 06/02/2019   Procedure: Patch Angioplasty using Hemashield Platinum Finesse Patch of the External Iliac Femoral Artery and  Profunda;  Surgeon: Waynetta Sandy, MD;  Location: [place];  Service: Vascular;  Laterality: Right;  . PERIPHERAL VASCULAR CATHETERIZATION N/A 03/28/2015   Procedure: Carotid PTA/Stent Intervention;  Surgeon: Lorretta Harp, MD;  Location: [place] CV LAB;  Service: Cardiovascular;  Laterality: N/A;  . US EXTREMITY*L*     lft arm tumor removed        Family History  Problem Relation Age of Onset  . Arthritis Mother   . Heart disease Father     Social History   Tobacco Use  . Smoking status: Current Every Day Smoker    Packs/day: 1.00    Years: 48.00    Pack years: 48.00    Types: Cigarettes  . Smokeless tobacco: Never Used  Substance Use Topics  . Alcohol use: Yes    Alcohol/week: 0.0 standard drinks    Comment: socially  . Drug use: Not Currently    Home Medications Prior  to Admission medications   Medication Sig Start Date End Date Taking? Authorizing Provider  acetaminophen (TYLENOL) 325 MG tablet Take 650 mg by mouth every 6 (six) hours as needed for mild pain or headache.     [provider]  aspirin 81 MG tablet Take 1 tablet (81 mg total) by mouth daily. 10/26/16   Consuelo Pandy, PA-C  clopidogrel (PLAVIX) 75 MG tablet Take 1 tablet (75 mg total) by mouth daily. 07/04/19   Hennie Duos, MD  levothyroxine (SYNTHROID) 50 MCG tablet Take 1 tablet (50 mcg total) by mouth daily. 07/04/19   Hennie Duos, MD  loratadine (CLARITIN) 10 MG tablet Take 10 mg by mouth daily.    [provider]  metoprolol succinate (TOPROL-XL) 25 MG 24 hr tablet Take 1 tablet (25 mg total) by mouth daily. 07/04/19 08/29/19  Hennie Duos, MD  nitroGLYCERIN (NITROSTAT) 0.4 MG SL tablet PLACE 1 TABLET (0.4 MG TOTAL) UNDER THE TONGUE EVERY 5 (FIVE) MINUTES times 3 then call MD 08/29/19   Tommye Standard, Lorelee Cover., PA-C  ranolazine (RANEXA) 1000 MG SR tablet TAKE 1 TABLET BY MOUTH 2 TIMES DAILY. NEED OV. 11/01/19   Lorretta Harp, MD  rosuvastatin (CRESTOR) 20 MG  tablet Take 1 tablet (20 mg total) by mouth daily. 08/29/19   Kroeger, Lorelee Cover., PA-C  sertraline (ZOLOFT) 100 MG tablet Take 2 and 1/2 tablets by mouth daily 07/04/19   Hennie Duos, MD  sitaGLIPtin-metformin (JANUMET) 50-500 MG tablet Take 1 tablet by mouth 2 (two) times daily with a meal. PCP NEED TO REFILL NEXT. 11/04/19 12/04/19  Troy Sine, MD    Allergies    Coreg [carvedilol] and Sulfa antibiotics  Review of Systems   Review of Systems All other systems reviewed and are negative except that which was mentioned in HPI  Physical Exam Updated Vital Signs BP (!) 84/61 Comment: left leg  Pulse 78   Temp 97.7 F (36.5 C) (Oral)   Resp 18   Ht 5\' 9"  (1.753 m)   Wt 71.2 kg   SpO2 100%   BMI 23.17 kg/m   Physical Exam Vitals and nursing note reviewed.  Constitutional:      General: He is not in acute distress.    Appearance: He is well-developed.  HENT:     Head: Normocephalic and atraumatic.  Eyes:     Conjunctiva/sclera: Conjunctivae normal.  Cardiovascular:     Rate and Rhythm: Normal rate and regular rhythm.     Pulses:          Dorsalis pedis pulses are detected w/ Doppler on the right side and 0 on the left side.       Posterior tibial pulses are detected w/ Doppler on the right side and 0 on the left side.     Heart sounds: Normal heart sounds. No murmur.  Pulmonary:     Effort: Pulmonary effort is normal.     Breath sounds: Normal breath sounds.  Abdominal:     General: Bowel sounds are normal. There is no distension.     Palpations: Abdomen is soft.     Tenderness: There is no abdominal tenderness.  Musculoskeletal:     Cervical back: Neck supple.     Comments: R 1st and 2nd toe amputations  Skin:    General: Skin is warm and dry.     Comments: Chronic ulcer on plantar R foot without drainage, L great toenail lifted almost completely off toe w/ dried  blood, crack in L heel with no drainage or erythema  Neurological:     Mental Status: He is alert  and oriented to person, place, and time.     Comments: Fluent speech  Psychiatric:        Judgment: Judgment normal.     ED Results / Procedures / Treatments   Labs (all labs ordered are listed, but only abnormal results are displayed) Labs Reviewed  BASIC METABOLIC PANEL - Abnormal; Notable for the following components:      Result Value   Sodium 129 (*)    Chloride 96 (*)    Glucose, Bld 198 (*)    Creatinine, Ser 1.29 (*)    GFR calc non Af Amer 57 (*)    All other components within normal limits  CBC WITH DIFFERENTIAL/PLATELET - Abnormal; Notable for the following components:   WBC 10.9 (*)    RBC 3.67 (*)    Hemoglobin 11.9 (*)    HCT 34.7 (*)    Neutro Abs 8.3 (*)    Abs Immature Granulocytes 0.08 (*)    All other components within normal limits  LACTIC ACID, PLASMA - Abnormal; Notable for the following components:   Lactic Acid, Venous 2.1 (*)    All other components within normal limits  SARS CORONAVIRUS 2 BY RT PCR (HOSPITAL ORDER, [place] [facility])    EKG None  Radiology DG Foot Complete Right  Result Date: 11/25/2019 CLINICAL DATA:  Right foot ulcer.  Evaluate for osteomyelitis EXAM: RIGHT FOOT COMPLETE - 3+ VIEW COMPARISON:  06/11/2019 FINDINGS: Prior 1st and 2nd toe transmetatarsal amputation. Stable appearance. Subluxation of the 3rd MTP joint medially. There is a fracture within the proximal phalanx of the 4th toe. This is new since prior study. No definite bone destruction seen to suggest osteomyelitis. IMPRESSION: First and 2nd toe amputation. Subluxation of the 3rd MTP joint. Fracture at the 4th toe proximal phalanx. Electronically Signed   By: Rolm Baptise M.D.   On: 11/25/2019 18:50    Procedures Procedures (including critical care time)  Medications Ordered in ED Medications  fentaNYL (SUBLIMAZE) injection 100 mcg (has no administration in time range)  sodium chloride 0.9 % bolus 1,000 mL (has no administration in time range)     ED Course  I have reviewed the triage vital signs and the nursing notes.  Pertinent labs & imaging results that were available during my care of the patient were reviewed by me and considered in my medical decision making (see chart for details).  ABIs: LEFT- 0.68 RIGHT- 0.81   MDM Rules/Calculators/A&P                      No signs of necrosis on feet however patient did not have palpable or dopplerable PT or DP pulses on the left side.  ABIs as above.  No evidence of osteomyelitis on right foot x-ray although he does not have incidental finding of right fourth toe fracture.  Lab work shows creatinine 1.3, lactate 2.1, sodium 129.  Gave the patient fluid bolus and pain medication.  I discussed with vascular surgeon on-call, Dr. Oneida Alar, who recommended hospitalist admission and they will see the patient in consultation.  He advised to not administer heparin for now.  I discussed admission with [facility] hospitalist at [facility], Dr. Marlowe Sax. PT will be transferred for further care. Final Clinical Impression(s) / ED Diagnoses Final diagnoses:  None    Rx / DC Orders ED  Discharge Orders    None       Jamylah Marinaccio, Wenda Overland, MD 11/25/19 2056

## 2019-11-25 NOTE — ED Notes (Signed)
  Patient pulled off ECG leads and SPO2 monitor.  Patient stated he was irritated and would rip his lead off again if we put them back on.  Patient given something to drink and settled down.

## 2019-11-25 NOTE — Telephone Encounter (Signed)
Called by [facility] regarding pt with ulcers on both feet with pain.  He had prior right fem pop and 1st toe TMA by Dr Donzetta Matters in the past.  He has known poor perfusion of the left foot with most recent ABI 0.24 on the left.  I was told by the ER physician that the patient had ABIs performed today but was unable to find those results in EPIC  I agreed to see pt as consult on the hospitalist service.  He will be transferred to [facility].  Ruta Hinds, MD [facility] Office: [phone]

## 2019-11-25 NOTE — ED Triage Notes (Signed)
Pt c/o pain to bilat LE-reports hx of DVT and toe amputations to right LE-pt c/o "crack" to left heel-NAD-steady gait

## 2019-11-26 ENCOUNTER — Encounter (HOSPITAL_COMMUNITY): Payer: Medicare Other

## 2019-11-26 DIAGNOSIS — E871 Hypo-osmolality and hyponatremia: Secondary | ICD-10-CM

## 2019-11-26 DIAGNOSIS — Z720 Tobacco use: Secondary | ICD-10-CM

## 2019-11-26 DIAGNOSIS — E08621 Diabetes mellitus due to underlying condition with foot ulcer: Secondary | ICD-10-CM

## 2019-11-26 DIAGNOSIS — I25118 Atherosclerotic heart disease of native coronary artery with other forms of angina pectoris: Secondary | ICD-10-CM

## 2019-11-26 DIAGNOSIS — L97509 Non-pressure chronic ulcer of other part of unspecified foot with unspecified severity: Secondary | ICD-10-CM

## 2019-11-26 DIAGNOSIS — I998 Other disorder of circulatory system: Secondary | ICD-10-CM

## 2019-11-26 DIAGNOSIS — I9589 Other hypotension: Secondary | ICD-10-CM

## 2019-11-26 DIAGNOSIS — S92514A Nondisplaced fracture of proximal phalanx of right lesser toe(s), initial encounter for closed fracture: Secondary | ICD-10-CM

## 2019-11-26 DIAGNOSIS — S81809A Unspecified open wound, unspecified lower leg, initial encounter: Secondary | ICD-10-CM

## 2019-11-26 DIAGNOSIS — I739 Peripheral vascular disease, unspecified: Secondary | ICD-10-CM

## 2019-11-26 DIAGNOSIS — J449 Chronic obstructive pulmonary disease, unspecified: Secondary | ICD-10-CM

## 2019-11-26 DIAGNOSIS — I2510 Atherosclerotic heart disease of native coronary artery without angina pectoris: Secondary | ICD-10-CM

## 2019-11-26 DIAGNOSIS — S92911A Unspecified fracture of right toe(s), initial encounter for closed fracture: Secondary | ICD-10-CM

## 2019-11-26 DIAGNOSIS — I70229 Atherosclerosis of native arteries of extremities with rest pain, unspecified extremity: Secondary | ICD-10-CM | POA: Diagnosis present

## 2019-11-26 DIAGNOSIS — I70234 Atherosclerosis of native arteries of right leg with ulceration of heel and midfoot: Secondary | ICD-10-CM

## 2019-11-26 LAB — BASIC METABOLIC PANEL
Anion gap: 10 (ref 5–15)
BUN: 15 mg/dL (ref 8–23)
CO2: 21 mmol/L — ABNORMAL LOW (ref 22–32)
Calcium: 10 mg/dL (ref 8.9–10.3)
Chloride: 101 mmol/L (ref 98–111)
Creatinine, Ser: 1.13 mg/dL (ref 0.61–1.24)
GFR calc Af Amer: >60 mL/min (ref >60)
GFR calc non Af Amer: >60 mL/min (ref >60)
Glucose, Bld: 125 mg/dL — ABNORMAL HIGH (ref 70–99)
Potassium: 4.4 mmol/L (ref 3.5–5.1)
Sodium: 132 mmol/L — ABNORMAL LOW (ref 135–145)

## 2019-11-26 LAB — GLUCOSE, CAPILLARY
Glucose-Capillary: 109 mg/dL — ABNORMAL HIGH (ref 70–99)
Glucose-Capillary: 138 mg/dL — ABNORMAL HIGH (ref 70–99)
Glucose-Capillary: 155 mg/dL — ABNORMAL HIGH (ref 70–99)
Glucose-Capillary: 206 mg/dL — ABNORMAL HIGH (ref 70–99)
Glucose-Capillary: 228 mg/dL — ABNORMAL HIGH (ref 70–99)

## 2019-11-26 LAB — CBC
HCT: 36.3 % — ABNORMAL LOW (ref 39.0–52.0)
Hemoglobin: 12.1 g/dL — ABNORMAL LOW (ref 13.0–17.0)
MCH: 31.8 pg (ref 26.0–34.0)
MCHC: 33.3 g/dL (ref 30.0–36.0)
MCV: 95.5 fL (ref 80.0–100.0)
Platelets: 253 10*3/uL (ref 150–400)
RBC: 3.8 MIL/uL — ABNORMAL LOW (ref 4.22–5.81)
RDW: 13.7 % (ref 11.5–15.5)
WBC: 10.8 10*3/uL — ABNORMAL HIGH (ref 4.0–10.5)
nRBC: 0 % (ref 0.0–0.2)

## 2019-11-26 LAB — HEMOGLOBIN A1C
Hgb A1c MFr Bld: 7.3 % — ABNORMAL HIGH (ref 4.8–5.6)
Mean Plasma Glucose: 162.81 mg/dL

## 2019-11-26 LAB — LACTIC ACID, PLASMA
Lactic Acid, Venous: 1.4 mmol/L (ref 0.5–1.9)
Lactic Acid, Venous: 1.1 mmol/L (ref 0.5–1.9)

## 2019-11-26 MED ORDER — MORPHINE SULFATE (PF) 2 MG/ML IV SOLN
1.0000 mg | INTRAVENOUS | Status: DC | PRN
  Administered 2019-11-26 – 2019-11-28 (×5): 1 mg via INTRAVENOUS
  Filled 2019-11-26 (×5): qty 1

## 2019-11-26 MED ORDER — INSULIN ASPART 100 UNIT/ML ~~LOC~~ SOLN
0.0000 [IU] | Freq: Three times a day (TID) | SUBCUTANEOUS | Status: DC
  Administered 2019-11-26: 2 [IU] via SUBCUTANEOUS
  Administered 2019-11-26: 3 [IU] via SUBCUTANEOUS
  Administered 2019-11-27: 2 [IU] via SUBCUTANEOUS
  Administered 2019-11-27 (×2): 1 [IU] via SUBCUTANEOUS
  Administered 2019-11-29: 5 [IU] via SUBCUTANEOUS
  Administered 2019-11-30: 2 [IU] via SUBCUTANEOUS
  Administered 2019-11-30: 5 [IU] via SUBCUTANEOUS
  Administered 2019-11-30: 3 [IU] via SUBCUTANEOUS

## 2019-11-26 MED ORDER — METFORMIN HCL 500 MG PO TABS: 500.0000 mg | ORAL_TABLET | Freq: Two times a day (BID) | ORAL | Status: DC

## 2019-11-26 MED ORDER — LINAGLIPTIN 5 MG PO TABS: 5.0000 mg | ORAL_TABLET | Freq: Two times a day (BID) | ORAL | Status: DC

## 2019-11-26 MED ORDER — GABAPENTIN 300 MG PO CAPS
300.0000 mg | ORAL_CAPSULE | Freq: Two times a day (BID) | ORAL | Status: DC
  Administered 2019-11-26 – 2019-12-08 (×25): 300 mg via ORAL
  Filled 2019-11-26 (×25): qty 1

## 2019-11-26 MED ORDER — ENOXAPARIN SODIUM 40 MG/0.4ML ~~LOC~~ SOLN
40.0000 mg | SUBCUTANEOUS | Status: DC
  Administered 2019-11-26 – 2019-11-27 (×2): 40 mg via SUBCUTANEOUS
  Filled 2019-11-26 (×2): qty 0.4

## 2019-11-26 MED ORDER — METOPROLOL TARTRATE 12.5 MG HALF TABLET
12.5000 mg | ORAL_TABLET | Freq: Two times a day (BID) | ORAL | Status: DC
  Administered 2019-11-26 – 2019-12-08 (×23): 12.5 mg via ORAL
  Filled 2019-11-26 (×24): qty 1

## 2019-11-26 MED ORDER — NICOTINE 21 MG/24HR TD PT24
21.0000 mg | MEDICATED_PATCH | Freq: Every day | TRANSDERMAL | Status: DC
  Administered 2019-11-26 – 2019-12-08 (×12): 21 mg via TRANSDERMAL
  Filled 2019-11-26 (×12): qty 1

## 2019-11-26 NOTE — Plan of Care (Signed)
  Problem: Education: Goal: Knowledge of General Education information will improve Description Including pain rating scale, medication(s)/side effects and non-pharmacologic comfort measures Outcome: Progressing   

## 2019-11-26 NOTE — H&P (Signed)
History and Physical    Jason [name] EUM:[id number] DOB: 1950/09/22 DOA: 11/25/2019  PCP: Horald Pollen, MD  Patient coming from: [facility]  I have personally briefly reviewed patient's old medical records in [place]  Chief Complaint: Worsening bilateral lower extremity pain  HPI: Jason [name] is a 69 y.o. male with medical history significant for PVD s/p R fem-pop bypass, COPD, CAD s/p CABG, T2DM, HTN, HLD who presents with worsening bilateral lower extremity pain.  States worsening pain has been ongoing for the past month.  Worse with ambulation and better at rest.  Also noted left foot heel wound and also an ulcer on the plantar surface of the right foot.  Patient continues to be a current smoker of at least 1 pack/day.  Denies any fever.   ED Course: He was intermittently hypotensive with improvement with IV fluids.  Lab work notable for elevated creatinine 1.29 from a prior of 1.18.  Sodium of 129.  Mild leukocytosis of 10.9. No signs of necrosis on feet but patient did not have palpable or doppler findings of posterior tibial or dorsalis pedis pulse on the left side.  Right foot x-ray was negative for osteomyelitis but had incidental finding of fourth toe fracture.  ABI in the ED showed left lower extremity of 0.68 and right foot 0.81.  Last left ABI in February 2021 was 0.24.  ED physician Dr. Rex Kras discussed case with vascular surgeon Dr. Oneida Alar who recommended patient be transferred and did not start heparin at this time.  Review of Systems:  Constitutional: No Weight Change, No Fever ENT/Mouth: No sore throat, No Rhinorrhea Eyes: No Eye Pain, No Vision Changes Cardiovascular: No Chest Pain, no SOB, + Claudication, No Edema Respiratory: No Cough, No Sputum Gastrointestinal: No Nausea, No Vomiting Genitourinary: no Urinary Incontinence, No Urgency, No Flank Pain Musculoskeletal: No Arthralgias, No Myalgias Skin: No Skin Lesions, No Pruritus, Neuro: no  Weakness, No Numbness Psych: No Anxiety/Panic, No Depression, no decrease appetite Heme/Lymph: No Bruising, No Bleeding  Past Medical History:  Diagnosis Date  . Anxiety    off xanax  and paxil since 3/13  . Arthritis   . Bilateral carotid artery disease ([place])    s/p R ICA stent 03/28/2015 with distal protection. Known chronically occluded L ICA. Carotid stent complicated by hypotension and acute stroke [place]  . Cancer ([place])    tumor basal cell rem from lft arm  . Cataract   . COPD (chronic obstructive pulmonary disease) ([place])   . Coronary artery disease    a. s/p CABG in 2011 with LIMA-LAD, SVG-RI/OM, and SVG-PDA b. occluded SVG-PDA by cath in 2015 c. 10/2016: NSTEMI with cath showing thrombus along the distal graft to insertion of SVG-OM2 with DES placed.   . CVA (cerebral infarction)    occured on 10/10 several days after R ICA carotid stenting  . Depression   . Diabetes mellitus   . GERD (gastroesophageal reflux disease)   . Heart attack ([place])   . High cholesterol   . Hypertension    type 2 NIDDM  . Myocardial infarct (H[facility])    x4 last 10 yrs  . Pneumonia    hx  . 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 Smoker   . Substance abuse [facility])     Past Surgical History:  Procedure Laterality Date  . ABDOMINAL AORTOGRAM W/LOWER EXTREMITY Bilateral 05/31/2019   Procedure: ABDOMINAL AORTOGRAM W/LOWER EXTREMITY;  Surgeon: Servando Snare  Harrell Gave, MD;  Location: [place] [facility];  Service: Cardiovascular;  Laterality: Bilateral;  . AMPUTATION TOE Right 06/02/2019   Procedure: Amputation Right Great Toe and Second Toe;  Surgeon: Waynetta Sandy, MD;  Location: [place];  Service: Vascular;  Laterality: Right;  . BACK SURGERY  Jan 2014, Dec 2011   Dr Vertell Limber  . CARDIAC CATHETERIZATION  4/12   Medical Rx  . CORONARY ANGIOGRAM  01/17/14   med rx  . CORONARY ANGIOGRAM  4/13   med Rx  . CORONARY ANGIOGRAM  1/15   Med Rx  .  CORONARY ANGIOPLASTY  Jan 2004   RCA  . CORONARY ARTERY BYPASS GRAFT  07/24/2009   L-LAD, SVG-RI/OM, SVG-PDA  . CORONARY STENT INTERVENTION N/A 10/23/2016   Procedure: Coronary Stent Intervention;  Surgeon: Peter M Martinique, MD;  Location: [place] [facility];  Service: Cardiovascular;  Laterality: N/A;  . ENDARTERECTOMY FEMORAL Right 06/02/2019   Procedure: Endarterectomy External Iliac  Femoral Artery and Profunda;  Surgeon: Waynetta Sandy, MD;  Location: [place];  Service: Vascular;  Laterality: Right;  . EYE SURGERY     cat bil  . FEMORAL ARTERY - FEMORAL ARTERY BYPASS GRAFT Right 2004   femoral enarterectomy  . FEMORAL-POPLITEAL BYPASS GRAFT Right 06/02/2019   Procedure: RIGHT LEG BYPASS GRAFT FEMORAL-POPLITEAL ARTERY using Gore Propaten Vascular Graft Removable Ring;  Surgeon: Waynetta Sandy, MD;  Location: [place];  Service: Vascular;  Laterality: Right;  . LEFT HEART CATH AND CORS/GRAFTS ANGIOGRAPHY N/A 10/23/2016   Procedure: Left Heart Cath and Cors/Grafts Angiography;  Surgeon: Peter M Martinique, MD;  Location: [place] [facility];  Service: Cardiovascular;  Laterality: N/A;  . LEFT HEART CATHETERIZATION WITH CORONARY ANGIOGRAM N/A 10/03/2011   Procedure: LEFT HEART CATHETERIZATION WITH CORONARY ANGIOGRAM;  Surgeon: Pixie Casino, MD;  Location: [facility] C[facility] LAB;  Service: Cardiovascular;  Laterality: N/A;  . LEFT HEART CATHETERIZATION WITH CORONARY ANGIOGRAM N/A 07/08/2013   Procedure: LEFT HEART CATHETERIZATION WITH CORONARY ANGIOGRAM;  Surgeon: Blane Ohara, MD;  Location: [facility] CATH LAB;  Service: Cardiovascular;  Laterality: N/A;  . LEFT HEART CATHETERIZATION WITH CORONARY/GRAFT ANGIOGRAM N/A 01/17/2014   Procedure: LEFT HEART CATHETERIZATION WITH Beatrix Fetters;  Surgeon: Troy Sine, MD;  Location: [facility] CATH LAB;  Service: Cardiovascular;  Laterality: N/A;  . PATCH ANGIOPLASTY Right 06/02/2019   Procedure: Patch Angioplasty using Hemashield Platinum Finesse Patch  of the External Iliac Femoral Artery and Profunda;  Surgeon: Waynetta Sandy, MD;  Location: [place];  Service: Vascular;  Laterality: Right;  . PERIPHERAL VASCULAR CATHETERIZATION N/A 03/28/2015   Procedure: Carotid PTA/Stent Intervention;  Surgeon: Lorretta Harp, MD;  Location: [place] [facility];  Service: Cardiovascular;  Laterality: N/A;  . US EXTREMITY*L*     lft arm tumor removed      reports that he has been smoking cigarettes. He has a 48.00 pack-year smoking history. He has never used smokeless tobacco. He reports current alcohol use. He reports previous drug use.  Allergies  Allergen Reactions  . Coreg [Carvedilol] Nausea And Vomiting and Other (See Comments)    Per patient made him dizzy and light sensitive  . Sulfa Antibiotics Nausea And Vomiting and Other (See Comments)    Also headaches     Family History  Problem Relation Age of Onset  . Arthritis Mother   . Heart disease Father      Prior to Admission medications   Medication Sig Start Date End Date Taking? Authorizing Provider  acetaminophen (TYLENOL) 325 MG tablet Take 650 mg by mouth every 6 (six) hours as needed for mild pain or headache.    Yes [provider]  aspirin 81 MG tablet Take 1 tablet (81 mg total) by mouth daily. 10/26/16  Yes Lyda Jester M, PA-C  clopidogrel (PLAVIX) 75 MG tablet Take 1 tablet (75 mg total) by mouth daily. 07/04/19  Yes Hennie Duos, MD  loratadine (CLARITIN) 10 MG tablet Take 10 mg by mouth daily.   Yes [provider]  ranolazine (RANEXA) 1000 MG SR tablet TAKE 1 TABLET BY MOUTH 2 TIMES DAILY. NEED OV. 11/01/19  Yes Lorretta Harp, MD  sitaGLIPtin-metformin (JANUMET) 50-500 MG tablet Take 1 tablet by mouth 2 (two) times daily with a meal. PCP NEED TO REFILL NEXT. 11/04/19 12/04/19 Yes Troy Sine, MD  levothyroxine (SYNTHROID) 50 MCG tablet Take 1 tablet (50 mcg total) by mouth daily. 07/04/19   Hennie Duos, MD  metoprolol succinate  (TOPROL-XL) 25 MG 24 hr tablet Take 1 tablet (25 mg total) by mouth daily. 07/04/19 08/29/19  Hennie Duos, MD  nitroGLYCERIN (NITROSTAT) 0.4 MG SL tablet PLACE 1 TABLET (0.4 MG TOTAL) UNDER THE TONGUE EVERY 5 (FIVE) MINUTES times 3 then call MD 08/29/19   Tommye Standard, Lorelee Cover., PA-C  rosuvastatin (CRESTOR) 20 MG tablet Take 1 tablet (20 mg total) by mouth daily. 08/29/19   Kroeger, Lorelee Cover., PA-C  sertraline (ZOLOFT) 100 MG tablet Take 2 and 1/2 tablets by mouth daily 07/04/19   Hennie Duos, MD    Physical Exam: Vitals:   11/25/19 2149 11/25/19 2322 11/25/19 2324 11/26/19 0016  BP:  (!) 164/86  (!) 141/70  Pulse: 70  86 70  Resp:   18 20  Temp:   97.9 F (36.6 C) 97.6 F (36.4 C)  TempSrc:   Oral Oral  SpO2: 100%  99% 100%  Weight:    76.4 kg  Height:        Constitutional: NAD, calm, comfortable, nontoxic appearing male laying comfortably in bed Vitals:   11/25/19 2149 11/25/19 2322 11/25/19 2324 11/26/19 0016  BP:  (!) 164/86  (!) 141/70  Pulse: 70  86 70  Resp:   18 20  Temp:   97.9 F (36.6 C) 97.6 F (36.4 C)  TempSrc:   Oral Oral  SpO2: 100%  99% 100%  Weight:    76.4 kg  Height:       Eyes: PERRL, lids and conjunctivae normal ENMT: Mucous membranes are moist.  Neck: normal, supple Respiratory: clear to auscultation bilaterally, no wheezing, no crackles. Normal respiratory effort. No accessory muscle use.  Cardiovascular: Regular rate and rhythm, no murmurs / rubs / gallops. No extremity edema. No palpable dorsalis pedis pulses bilaterally.  Abdomen: no tenderness, no masses palpated.  Bowel sounds positive.  Musculoskeletal: no clubbing / cyanosis.  Amputated right great and second toe. Left lower extremity: Great toe nail has lifted off the nail bed with dried blood.  Lateral small ulcer wound.  Small slit like wound to left heel.  Pain to left foot with light palpation out of proportion to foot wound.  Right lower extremity: Plantar ulcer wound at the base of  the fourth toe.  Small ulcer wound to lateral great toe stump MTP. Skin: No rash Neurologic: CN 2-12 grossly intact. Sensation intact, Strength 5/5 in all 4.  Psychiatric: Normal judgment and insight. Alert and oriented x 3. Normal mood.     Labs on  Admission: I have personally reviewed following labs and imaging studies  CBC: Recent Labs  Lab 11/25/19 1810  WBC 10.9*  NEUTROABS 8.3*  HGB 11.9*  HCT 34.7*  MCV 94.6  PLT 779   Basic Metabolic Panel: Recent Labs  Lab 11/25/19 1810  NA 129*  K 4.1  CL 96*  CO2 22  GLUCOSE 198*  BUN 23  CREATININE 1.29*  CALCIUM 9.5   GFR: Estimated Creatinine Clearance: 55.6 mL/min (A) (by C-G formula based on SCr of 1.29 mg/dL (H)). Liver Function Tests: No results for input(s): AST, ALT, ALKPHOS, BILITOT, PROT, ALBUMIN in the last 168 hours. No results for input(s): LIPASE, AMYLASE in the last 168 hours. No results for input(s): AMMONIA in the last 168 hours. Coagulation Profile: No results for input(s): INR, PROTIME in the last 168 hours. Cardiac Enzymes: No results for input(s): CKTOTAL, CKMB, CKMBINDEX, TROPONINI in the last 168 hours. BNP (last 3 results) No results for input(s): PROBNP in the last 8760 hours. HbA1C: No results for input(s): HGBA1C in the last 72 hours. CBG: Recent Labs  Lab 11/25/19 2331 11/26/19 0035  GLUCAP 138* 138*   Lipid Profile: No results for input(s): CHOL, HDL, LDLCALC, TRIG, CHOLHDL, LDLDIRECT in the last 72 hours. Thyroid Function Tests: No results for input(s): TSH, T4TOTAL, FREET4, T3FREE, THYROIDAB in the last 72 hours. Anemia Panel: No results for input(s): VITAMINB12, FOLATE, FERRITIN, TIBC, IRON, RETICCTPCT in the last 72 hours. Urine analysis:    Component Value Date/Time   COLORURINE YELLOW 07/08/2013 0226   APPEARANCEUR CLEAR 07/08/2013 0226   LABSPEC 1.006 07/08/2013 0226   PHURINE 6.0 07/08/2013 0226   GLUCOSEU NEGATIVE 07/08/2013 0226   HGBUR NEGATIVE 07/08/2013 0226    BILIRUBINUR NEGATIVE 07/08/2013 0226   KETONESUR NEGATIVE 07/08/2013 0226   PROTEINUR NEGATIVE 07/08/2013 0226   UROBILINOGEN 0.2 07/08/2013 0226   NITRITE NEGATIVE 07/08/2013 0226   LEUKOCYTESUR NEGATIVE 07/08/2013 0226    Radiological Exams on Admission: DG Foot Complete Right  Result Date: 11/25/2019 CLINICAL DATA:  Right foot ulcer.  Evaluate for osteomyelitis EXAM: RIGHT FOOT COMPLETE - 3+ VIEW COMPARISON:  06/11/2019 FINDINGS: Prior 1st and 2nd toe transmetatarsal amputation. Stable appearance. Subluxation of the 3rd MTP joint medially. There is a fracture within the proximal phalanx of the 4th toe. This is new since prior study. No definite bone destruction seen to suggest osteomyelitis. IMPRESSION: First and 2nd toe amputation. Subluxation of the 3rd MTP joint. Fracture at the 4th toe proximal phalanx. Electronically Signed   By: Rolm Baptise M.D.   On: 11/25/2019 18:50      Assessment/Plan  Bilateral lower extremity claudication/Left ischemic limb with history of PVD s/p right femoropopliteal bypass  ED ABI showed left lower extremity of 0.68 and right foot 0.81.  Last left ABI in February 2021 was 0.24.  No palpable or Doppler findings of PT or DP pulses on left side. Continue aspirin and Plavix Vascular surgery to evaluate in the morning  Left great toe nail, heel, lateral wound/ Right plantar ulcer wound at base of 4th MTP Wound care consult  Right 4th toe fracture Rigid sole shoes for ambulation  Hypotension Likely due to hypovolemia.  We will hold metoprolol  AKI Patient received 1 L normal saline fluid in the ED.  Avoid nephrotoxic agent.  Hyponatremia Likely secondary to hypovolemia.  Will do repeat BMP to follow.  Tobacco abuse Nicotine patch  Type 2 diabetes Sensitive sliding scale  History of CAD s/p CABG Continue Ranexa, statin  Hypothyroidism Continue levothyroxine  DVT prophylaxis:.Lovenox Code Status: DNR Family Communication: Plan discussed  with patient at bedside  disposition Plan: Home with at least 2 midnight stays  Consults called:  Admission status: inpatient Status is: Inpatient  Remains inpatient appropriate because:Inpatient level of care appropriate due to severity of illness   Dispo: The patient is from: Home              Anticipated d/c is to: Home              Anticipated d/c date is: 3 days              Patient currently is not medically stable to d/c.         Orene Desanctis DO [facility]   If 7PM-7AM, please contact night-coverage [email or web address]   11/26/2019, 1:23 AM

## 2019-11-26 NOTE — Consult Note (Signed)
Patient name: Jason [name] MRN: [id number] DOB: 24-Mar-1951 Sex: male  REASON FOR CONSULT: non healing foot wounds  HPI: Jason [name] is a 69 y.o. male, presented to [facility] med center yesterday with several weeks of slowly worsening burning and stinging in both feet.  He says both feet are equally painful.  He has ulcers on the right foot and an avulsed toenail and heel crack on the left foot.  Pt has also had increased swelling in the right foot.  Pt has been previously seen by Dr Donzetta Matters. He had right femoral endarterectomy and right fem BK pop with PTFE 12/20 with amputation of 2 toes.  His last ABI in the left foot in February was 0.24 and left was 0.85.  COVID test negative yesterday.  Other medical problems include carotid occlusive disease with right ICA stent by Dr Gwenlyn Found 2016, COPD, CAD, diabetes hypertension elevated cholesterol all of which have been stable.  He is on plavix aspirin and a statin.  Past Medical History:  Diagnosis Date  . Anxiety    off xanax  and paxil since 3/13  . Arthritis   . Bilateral carotid artery disease ([place])    s/p R ICA stent 03/28/2015 with distal protection. Known chronically occluded L ICA. Carotid stent complicated by hypotension and acute stroke in [place]  . Cancer ([place])    tumor basal cell rem from lft arm  . Cataract   . COPD (chronic obstructive pulmonary disease) ([place])   . Coronary artery disease    a. s/p CABG in 2011 with LIMA-LAD, SVG-RI/OM, and SVG-PDA b. occluded SVG-PDA by cath in 2015 c. 10/2016: NSTEMI with cath showing thrombus along the distal graft to insertion of SVG-OM2 with DES placed.   . CVA (cerebral infarction)    occured on 10/10 several days after R ICA carotid stenting  . Depression   . Diabetes mellitus   . GERD (gastroesophageal reflux disease)   . Heart attack ([place])   . High cholesterol   . Hypertension    type 2 NIDDM  . Myocardial infarct (HCC)    x4 last 10 yrs  . Pneumonia    hx  .  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 Smoker   . Substance abuse [facility])    Past Surgical History:  Procedure Laterality Date  . ABDOMINAL AORTOGRAM W/LOWER EXTREMITY Bilateral 05/31/2019   Procedure: ABDOMINAL AORTOGRAM W/LOWER EXTREMITY;  Surgeon: Waynetta Sandy, MD;  Location: [place] [facility];  Service: Cardiovascular;  Laterality: Bilateral;  . AMPUTATION TOE Right 06/02/2019   Procedure: Amputation Right Great Toe and Second Toe;  Surgeon: Waynetta Sandy, MD;  Location: [place];  Service: Vascular;  Laterality: Right;  . BACK SURGERY  Jan 2014, Dec 2011   Dr Vertell Limber  . CARDIAC CATHETERIZATION  4/12   Medical Rx  . CORONARY ANGIOGRAM  01/17/14   med rx  . CORONARY ANGIOGRAM  4/13   med Rx  . CORONARY ANGIOGRAM  1/15   Med Rx  . CORONARY ANGIOPLASTY  Jan 2004   RCA  . CORONARY ARTERY BYPASS GRAFT  07/24/2009   L-LAD, SVG-RI/OM, SVG-PDA  . CORONARY STENT INTERVENTION N/A 10/23/2016   Procedure: Coronary Stent Intervention;  Surgeon: Peter M Martinique, MD;  Location: [place] [facility];  Service: Cardiovascular;  Laterality: N/A;  . ENDARTERECTOMY FEMORAL Right 06/02/2019   Procedure: Endarterectomy External Iliac  Femoral Artery and Profunda;  Surgeon: Servando Snare  Harrell Gave, MD;  Location: [place];  Service: Vascular;  Laterality: Right;  . EYE SURGERY     cat bil  . FEMORAL ARTERY - FEMORAL ARTERY BYPASS GRAFT Right 2004   femoral enarterectomy  . FEMORAL-POPLITEAL BYPASS GRAFT Right 06/02/2019   Procedure: RIGHT LEG BYPASS GRAFT FEMORAL-POPLITEAL ARTERY using Gore Propaten Vascular Graft Removable Ring;  Surgeon: Waynetta Sandy, MD;  Location: [place];  Service: Vascular;  Laterality: Right;  . LEFT HEART CATH AND CORS/GRAFTS ANGIOGRAPHY N/A 10/23/2016   Procedure: Left Heart Cath and Cors/Grafts Angiography;  Surgeon: Peter M Martinique, MD;  Location: [place] [facility];  Service: Cardiovascular;  Laterality: N/A;  . LEFT  HEART CATHETERIZATION WITH CORONARY ANGIOGRAM N/A 10/03/2011   Procedure: LEFT HEART CATHETERIZATION WITH CORONARY ANGIOGRAM;  Surgeon: Pixie Casino, MD;  Location: [facility] CATH LAB;  Service: Cardiovascular;  Laterality: N/A;  . LEFT HEART CATHETERIZATION WITH CORONARY ANGIOGRAM N/A 07/08/2013   Procedure: LEFT HEART CATHETERIZATION WITH CORONARY ANGIOGRAM;  Surgeon: Blane Ohara, MD;  Location: [facility] CATH LAB;  Service: Cardiovascular;  Laterality: N/A;  . LEFT HEART CATHETERIZATION WITH CORONARY/GRAFT ANGIOGRAM N/A 01/17/2014   Procedure: LEFT HEART CATHETERIZATION WITH Beatrix Fetters;  Surgeon: Troy Sine, MD;  Location: [facility] CATH LAB;  Service: Cardiovascular;  Laterality: N/A;  . PATCH ANGIOPLASTY Right 06/02/2019   Procedure: Patch Angioplasty using Hemashield Platinum Finesse Patch of the External Iliac Femoral Artery and Profunda;  Surgeon: Waynetta Sandy, MD;  Location: [place];  Service: Vascular;  Laterality: Right;  . PERIPHERAL VASCULAR CATHETERIZATION N/A 03/28/2015   Procedure: Carotid PTA/Stent Intervention;  Surgeon: Lorretta Harp, MD;  Location: [place] [facility];  Service: Cardiovascular;  Laterality: N/A;  . US EXTREMITY*L*     lft arm tumor removed     Family History  Problem Relation Age of Onset  . Arthritis Mother   . Heart disease Father     SOCIAL HISTORY: Social History   Socioeconomic History  . Marital status: Single    Spouse name: Not on file  . Number of children: 1  . Years of education: 71  . Highest education level: Not on file  Occupational History  . Occupation: welder-fabricator  Tobacco Use  . Smoking status: Current Every Day Smoker    Packs/day: 1.00    Years: 48.00    Pack years: 48.00    Types: Cigarettes  . Smokeless tobacco: Never Used  Substance and Sexual Activity  . Alcohol use: Yes    Alcohol/week: 0.0 standard drinks    Comment: socially  . Drug use: Not Currently  . Sexual activity: Not on file  Other  Topics Concern  . Not on file  Social History Narrative   Lives alone   Caffeine use: Drinks pepsi/coffee (32 oz diet pepsi per day)    Exercise walking 4 times per week for 1 mile   Current PPD smoker.    Social Determinants of Health   Financial Resource Strain:   . Difficulty of Paying Living Expenses:   Food Insecurity:   . Worried About Charity fundraiser in the Last Year:   . Arboriculturist in the Last Year:   Transportation Needs:   . Film/video editor (Medical):   Marland Kitchen Lack of Transportation (Non-Medical):   Physical Activity:   . Days of Exercise per Week:   . Minutes of Exercise per Session:   Stress:   . Feeling of Stress :   Social Connections:   .  Frequency of Communication with Friends and Family:   . Frequency of Social Gatherings with Friends and Family:   . Attends Religious Services:   . Active Member of Clubs or Organizations:   . Attends Archivist Meetings:   Marland Kitchen Marital Status:   Intimate Partner Violence:   . Fear of Current or Ex-Partner:   . Emotionally Abused:   Marland Kitchen Physically Abused:   . Sexually Abused:     Allergies  Allergen Reactions  . Coreg [Carvedilol] Nausea And Vomiting and Other (See Comments)    Per patient made him dizzy and light sensitive  . Sulfa Antibiotics Nausea And Vomiting and Other (See Comments)    Also headaches     Current Facility-Administered Medications  Medication Dose Route Frequency Provider Last Rate Last Admin  . aspirin EC tablet 81 mg  81 mg Oral Daily Little, Wenda Overland, MD      . clopidogrel (PLAVIX) tablet 75 mg  75 mg Oral Daily Little, Wenda Overland, MD      . enoxaparin (LOVENOX) injection 40 mg  40 mg Subcutaneous Q24H Tu, Ching T, DO      . insulin aspart (novoLOG) injection 0-9 Units  0-9 Units Subcutaneous TID WC Tu, Ching T, DO      . levothyroxine (SYNTHROID) tablet 50 mcg  50 mcg Oral Daily Little, Wenda Overland, MD      . morphine 2 MG/ML injection 1 mg  1 mg Intravenous Q3H  PRN Tu, Ching T, DO   1 mg at 11/26/19 4010  . nicotine (NICODERM CQ - dosed in mg/24 hours) patch 21 mg  21 mg Transdermal Daily Tu, Ching T, DO      . ranolazine (RANEXA) 12 hr tablet 1,000 mg  1,000 mg Oral BID Little, Wenda Overland, MD      . rosuvastatin (CRESTOR) tablet 20 mg  20 mg Oral Daily Little, Wenda Overland, MD      . sertraline (ZOLOFT) tablet 100 mg  100 mg Oral Daily Little, Wenda Overland, MD        ROS:   General:  No weight loss, Fever, chills  HEENT: No recent headaches, no nasal bleeding, no visual changes, no sore throat  Neurologic: No dizziness, blackouts, seizures. No recent symptoms of stroke or mini- stroke. No recent episodes of slurred speech, or temporary blindness.  Cardiac: No recent episodes of chest pain/pressure, no shortness of breath at rest.  No shortness of breath with exertion.  Denies history of atrial fibrillation or irregular heartbeat  Vascular: No history of rest pain in feet.  + history of claudication.  + history of non-healing ulcer, No history of DVT   Pulmonary: No home oxygen, no productive cough, no hemoptysis,  No asthma or wheezing  Musculoskeletal:  [ ]  Arthritis, [ ]  Low back pain,  [ ]  Joint pain  Hematologic:No history of hypercoagulable state.  No history of easy bleeding.  No history of anemia  Gastrointestinal: No hematochezia or melena,  No gastroesophageal reflux, no trouble swallowing  Urinary: [ ]  chronic Kidney disease, [ ]  on HD - [ ]  MWF or [ ]  TTHS, [ ]  Burning with urination, [ ]  Frequent urination, [ ]  Difficulty urinating;   Skin: No rashes  Psychological: No history of anxiety,  No history of depression   Physical Examination  Vitals:   11/25/19 2322 11/25/19 2324 11/26/19 0016 11/26/19 0421  BP: (!) 164/86  (!) 141/70 (!) 144/57  Pulse:  86 70 79  Resp:  18 20 20   Temp:  97.9 F (36.6 C) 97.6 F (36.4 C) 98.1 F (36.7 C)  TempSrc:  Oral Oral Oral  SpO2:  99% 100% 95%  Weight:   76.4 kg   Height:         Body mass index is 24.87 kg/m.  General:  Alert and oriented, no acute distress HEENT: Normal Neck: No JVD Cardiac: Regular Rate and Rhythm Skin: No rash, 2 cm less than 1 mm depth right first metatarsal head ulcer, crack left heel 2 cm length, avulsed right first toenail, left foot is cool and dusky Extremity Pulses:  1+ bilateral femoral, absent dorsalis pedis, posterior tibial pulses bilaterally, right foot is more warm than left Musculoskeletal: No deformity mild diffuse right pedal and ankle edema  Neurologic: Upper and lower extremity motor 5/5 and symmetric  DATA:  CBC    Component Value Date/Time   WBC 10.8 (H) 11/26/2019 0304   RBC 3.80 (L) 11/26/2019 0304   HGB 12.1 (L) 11/26/2019 0304   HCT 36.3 (L) 11/26/2019 0304   PLT 253 11/26/2019 0304   MCV 95.5 11/26/2019 0304   MCV 96.7 12/23/2016 1549   MCH 31.8 11/26/2019 0304   MCHC 33.3 11/26/2019 0304   RDW 13.7 11/26/2019 0304   LYMPHSABS 1.5 11/25/2019 1810   MONOABS 0.9 11/25/2019 1810   EOSABS 0.2 11/25/2019 1810   BASOSABS 0.0 11/25/2019 1810    BMET    Component Value Date/Time   NA 132 (L) 11/26/2019 0304   NA 131 (L) 09/30/2017 1629   K 4.4 11/26/2019 0304   CL 101 11/26/2019 0304   CO2 21 (L) 11/26/2019 0304   GLUCOSE 125 (H) 11/26/2019 0304   BUN 15 11/26/2019 0304   BUN 13 09/30/2017 1629   CREATININE 1.13 11/26/2019 0304   CREATININE 1.14 07/12/2015 1220   CALCIUM 10.0 11/26/2019 0304   GFRNONAA >60 11/26/2019 0304   GFRNONAA 69 07/12/2015 1220   GFRAA >60 11/26/2019 0304   GFRAA 79 07/12/2015 1220    Xray foot right fourth toe fracture.  ASSESSMENT:  Bilateral foot pain.  I suspect most of the burning and stinging that he complains about is diabetic neuropathy.  However, he does now have new non healing wounds on the left foot with known severe PAD in the left leg   PLAN: 1. PAD left greater than right.  Will get new baseline ABIs.  Doubt ABI of 0.8 at [facility] was accurate based  on pt known anatomy.  Will also get bilateral duplex to check right bypass for narrowing and assess further left leg.  He can eat from my standpoint.  No intervention planned until middle of next week.  2. Peripheral neuropathy would consider starting lyrica or neurontin will leave at discretion of primary service.  3. Wound care would recommend dry dressing to protect both feet and wash daily once with warm water and soap  Most likely pt will have arteriogram next Wednesday followed by bypass next Thursday.   Ruta Hinds, MD [facility] [place] Office: [phone] Pager: [phone]

## 2019-11-26 NOTE — Progress Notes (Signed)
New Admission Note: ? Arrival Method: E-Link Mental Orientation: Alert and Oriented x4 Telemetry: Box 14 Assessment: Completed Skin: Refer to flowsheet IV: Right Antecubital  Pain: 10/10 refer to [facility] Tubes: None Safety Measures: Safety Fall Prevention Plan discussed with patient. Admission: Completed 5 [place] Orientation: Patient has been orientated to the room, unit and the staff. Family: None at the bedside at this time Orders have been reviewed and are being implemented. Will continue to monitor the patient. Call light has been placed within reach and bed alarm has been activated.  ? Milagros Loll, RN  Phone Number: [phone]

## 2019-11-26 NOTE — Progress Notes (Signed)
PROGRESS NOTE    Jason [name]  NLG:[id number] DOB: [birth year] DOA: 11/25/2019 PCP: Horald Pollen, MD    Brief Narrative:  69 y.o. male with medical history significant for PVD s/p R fem-pop bypass, COPD, CAD s/p CABG, T2DM, HTN, HLD who presents with worsening bilateral lower extremity pain.  States worsening pain has been ongoing for the past month.  Worse with ambulation and better at rest.  Also noted left foot heel wound and also an ulcer on the plantar surface of the right foot.  Patient continues to be a current smoker of at least 1 pack/day.  Denies any fever.   ED Course: He was intermittently hypotensive with improvement with IV fluids.  Lab work notable for elevated creatinine 1.29 from a prior of 1.18.  Sodium of 129.  Mild leukocytosis of 10.9. No signs of necrosis on feet but patient did not have palpable or doppler findings of posterior tibial or dorsalis pedis pulse on the left side.  Right foot x-ray was negative for osteomyelitis but had incidental finding of fourth toe fracture.  ABI in the ED showed left lower extremity of 0.68 and right foot 0.81.  Last left ABI in February 2021 was 0.24.  ED physician Dr. Rex Kras discussed case with vascular surgeon Dr. Oneida Alar  Assessment & Plan:   Principal Problem:   Critical lower limb ischemia Active Problems:   DM (diabetes mellitus) ([place])   CAD, CABG Feb 2011, cath x 4 since-medical Rx   PVD, hx Rt femoral endarterectomy 2004   Tobacco abuse   Hyponatremia   Hypotension   Hypothyroidism   Wound of lower extremity   Toe fracture, right   Bilateral lower extremity claudication/Left ischemic limb with history of PVD s/p right femoropopliteal bypass - ABI in the ED with left lower extremity of 0.68 and right foot 0.81.  Last left ABI in February 2021 was 0.24.  No palpable or Doppler findings of PT or DP pulses on left side. -Patient is continued on aspirin and Plavix -Vascular Surgery following.  Recommendation for B duplex to check R bypass for narrowing and assess further LLE without intervention planned until middle of next week -Have started trial of low dose neurontin per Vascular Surgery recs   Left great toe nail, heel, lateral wound/ Right plantar ulcer wound at base of 4th MTP -Wound care consulted  Right 4th toe fracture -Cont with rigid sole shoes for ambulation  Hypotension -Suspect secondary to hypovolemia.  -BP now improved -Will resume metoprolol, albeit at lower dose for now with hold parameters  AKI -Patient received 1 L normal saline fluid in the ED.  Avoid nephrotoxic agents. -Cr has improved to 1.13 -Repeat bmet in AM  Hyponatremia -Likely secondary to hypovolemia. -Improved with IVF hydration -Repeat renal panel in AM  Tobacco abuse -continued on nicotine patch  Type 2 diabetes -Continue with sensitive sliding scale -A1c of 7.3 -Glucose trends overall stable with exception of one reading in the 200's  History of CAD s/p CABG -Continue Ranexa, statin as tolerated  Hypothyroidism -Continue levothyroxine as tolerated  DVT prophylaxis: Lovenox subq Code Status: DNR Family Communication: Pt in room, family not at bedside  Status is: Inpatient  Remains inpatient appropriate because:Ongoing diagnostic testing needed not appropriate for outpatient work up   Dispo: The patient is from: Home              Anticipated d/c is to: Home  Anticipated d/c date is: > 3 days              Patient currently is not medically stable to d/c.       Consultants:   Vascular Surgery  Procedures:     Antimicrobials: Anti-infectives (From admission, onward)   None       Subjective: Complains of continued LE discomfort  Objective: Vitals:   11/26/19 0016 11/26/19 0421 11/26/19 0927 11/26/19 1300  BP: (!) 141/70 (!) 144/57 (!) 142/65 140/67  Pulse: 70 79 85 86  Resp: 20 20 16 18   Temp: 97.6 F (36.4 C) 98.1 F (36.7  C) (!) 97.5 F (36.4 C) 98.2 F (36.8 C)  TempSrc: Oral Oral Oral Oral  SpO2: 100% 95% 100% 100%  Weight: 76.4 kg     Height:        Intake/Output Summary (Last 24 hours) at 11/26/2019 1538 Last data filed at 11/26/2019 1300 Gross per 24 hour  Intake 240 ml  Output 2220 ml  Net -1980 ml   Filed Weights   11/25/19 1639 11/26/19 0016  Weight: 71.2 kg 76.4 kg    Examination:  General exam: Appears calm and comfortable  Respiratory system: Clear to auscultation. Respiratory effort normal. Cardiovascular system: S1 & S2 heard, Regular Gastrointestinal system: Abdomen is nondistended, soft and nontender. No organomegaly or masses felt. Normal bowel sounds heard. Central nervous system: Alert and oriented. No focal neurological deficits. Extremities: Symmetric 5 x 5 power. Skin: No rashes, lesions  Psychiatry: Judgement and insight appear normal. Mood & affect appropriate.   Data Reviewed: I have personally reviewed following labs and imaging studies  CBC: Recent Labs  Lab 11/25/19 1810 11/26/19 0304  WBC 10.9* 10.8*  NEUTROABS 8.3*  --   HGB 11.9* 12.1*  HCT 34.7* 36.3*  MCV 94.6 95.5  PLT 228 086   Basic Metabolic Panel: Recent Labs  Lab 11/25/19 1810 11/26/19 0304  NA 129* 132*  K 4.1 4.4  CL 96* 101  CO2 22 21*  GLUCOSE 198* 125*  BUN 23 15  CREATININE 1.29* 1.13  CALCIUM 9.5 10.0   GFR: Estimated Creatinine Clearance: 63.4 mL/min (by C-G formula based on SCr of 1.13 mg/dL). Liver Function Tests: No results for input(s): AST, ALT, ALKPHOS, BILITOT, PROT, ALBUMIN in the last 168 hours. No results for input(s): LIPASE, AMYLASE in the last 168 hours. No results for input(s): AMMONIA in the last 168 hours. Coagulation Profile: No results for input(s): INR, PROTIME in the last 168 hours. Cardiac Enzymes: No results for input(s): CKTOTAL, CKMB, CKMBINDEX, TROPONINI in the last 168 hours. BNP (last 3 results) No results for input(s): PROBNP in the last 8760  hours. HbA1C: Recent Labs    11/26/19 0304  HGBA1C 7.3*   CBG: Recent Labs  Lab 11/25/19 2331 11/26/19 0035 11/26/19 0649 11/26/19 1151  GLUCAP 138* 138* 109* 228*   Lipid Profile: No results for input(s): CHOL, HDL, LDLCALC, TRIG, CHOLHDL, LDLDIRECT in the last 72 hours. Thyroid Function Tests: No results for input(s): TSH, T4TOTAL, FREET4, T3FREE, THYROIDAB in the last 72 hours. Anemia Panel: No results for input(s): VITAMINB12, FOLATE, FERRITIN, TIBC, IRON, RETICCTPCT in the last 72 hours. Sepsis Labs: Recent Labs  Lab 11/25/19 1810 11/26/19 0304 11/26/19 0704  LATICACIDVEN 2.1* 1.4 1.1    Recent Results (from the past 240 hour(s))  SARS Coronavirus 2 by RT PCR (hospital order, performed in [facility] hospital lab) Nasopharyngeal Nasopharyngeal Swab     Status: None   Collection  Time: 11/25/19  7:50 PM   Specimen: Nasopharyngeal Swab  Result Value Ref Range Status   SARS Coronavirus 2 NEGATIVE NEGATIVE Final    Comment: (NOTE) SARS-CoV-2 target nucleic acids are NOT DETECTED. The SARS-CoV-2 RNA is generally detectable in upper and lower respiratory specimens during the acute phase of infection. The lowest concentration of SARS-CoV-2 viral copies this assay can detect is 250 copies / mL. A negative result does not preclude SARS-CoV-2 infection and should not be used as the sole basis for treatment or other patient management decisions.  A negative result may occur with improper specimen collection / handling, submission of specimen other than nasopharyngeal swab, presence of viral mutation(s) within the areas targeted by this assay, and inadequate number of viral copies (<250 copies / mL). A negative result must be combined with clinical observations, patient history, and epidemiological information. Fact Sheet for Patients:   StrictlyIdeas.no Fact Sheet for Healthcare Providers: BankingDealers.co.za This test is  not yet approved or cleared  by the [place] FDA and has been authorized for detection and/or diagnosis of SARS-CoV-2 by FDA under an Emergency Use Authorization (EUA).  This EUA will remain in effect (meaning this test can be used) for the duration of the COVID-19 declaration under Section 564(b)(1) of the Act, 21 U.S.C. section 360bbb-3(b)(1), unless the authorization is terminated or revoked sooner. Performed at [facility], [address], [place], [place] [address]      Radiology Studies: DG Foot Complete Right  Result Date: 11/25/2019 CLINICAL DATA:  Right foot ulcer.  Evaluate for osteomyelitis EXAM: RIGHT FOOT COMPLETE - 3+ VIEW COMPARISON:  06/11/2019 FINDINGS: Prior 1st and 2nd toe transmetatarsal amputation. Stable appearance. Subluxation of the 3rd MTP joint medially. There is a fracture within the proximal phalanx of the 4th toe. This is new since prior study. No definite bone destruction seen to suggest osteomyelitis. IMPRESSION: First and 2nd toe amputation. Subluxation of the 3rd MTP joint. Fracture at the 4th toe proximal phalanx. Electronically Signed   By: Rolm Baptise M.D.   On: 11/25/2019 18:50    Scheduled Meds: . aspirin EC  81 mg Oral Daily  . clopidogrel  75 mg Oral Daily  . enoxaparin (LOVENOX) injection  40 mg Subcutaneous Q24H  . gabapentin  300 mg Oral BID  . insulin aspart  0-9 Units Subcutaneous TID WC  . levothyroxine  50 mcg Oral Daily  . nicotine  21 mg Transdermal Daily  . ranolazine  1,000 mg Oral BID  . rosuvastatin  20 mg Oral Daily  . sertraline  100 mg Oral Daily   Continuous Infusions:   LOS: 1 day   Marylu Lund, MD [facility] Pager On Amion  If 7PM-7AM, please contact night-coverage 11/26/2019, 3:38 PM

## 2019-11-27 ENCOUNTER — Inpatient Hospital Stay (HOSPITAL_COMMUNITY): Payer: Medicare Other

## 2019-11-27 DIAGNOSIS — S81809A Unspecified open wound, unspecified lower leg, initial encounter: Secondary | ICD-10-CM

## 2019-11-27 DIAGNOSIS — I998 Other disorder of circulatory system: Secondary | ICD-10-CM

## 2019-11-27 LAB — GLUCOSE, CAPILLARY
Glucose-Capillary: 139 mg/dL — ABNORMAL HIGH (ref 70–99)
Glucose-Capillary: 122 mg/dL — ABNORMAL HIGH (ref 70–99)
Glucose-Capillary: 157 mg/dL — ABNORMAL HIGH (ref 70–99)
Glucose-Capillary: 216 mg/dL — ABNORMAL HIGH (ref 70–99)

## 2019-11-27 LAB — COMPREHENSIVE METABOLIC PANEL
ALT: 14 U/L (ref 0–44)
AST: 12 U/L — ABNORMAL LOW (ref 15–41)
Albumin: 3.7 g/dL (ref 3.5–5.0)
Alkaline Phosphatase: 75 U/L (ref 38–126)
Anion gap: 13 (ref 5–15)
BUN: 13 mg/dL (ref 8–23)
CO2: 22 mmol/L (ref 22–32)
Calcium: 9.7 mg/dL (ref 8.9–10.3)
Chloride: 97 mmol/L — ABNORMAL LOW (ref 98–111)
Creatinine, Ser: 1.14 mg/dL (ref 0.61–1.24)
GFR calc Af Amer: >60 mL/min (ref >60)
GFR calc non Af Amer: >60 mL/min (ref >60)
Glucose, Bld: 139 mg/dL — ABNORMAL HIGH (ref 70–99)
Potassium: 4 mmol/L (ref 3.5–5.1)
Sodium: 132 mmol/L — ABNORMAL LOW (ref 135–145)
Total Bilirubin: 0.4 mg/dL (ref 0.3–1.2)
Total Protein: 7.3 g/dL (ref 6.5–8.1)

## 2019-11-27 LAB — CBC
HCT: 37.3 % — ABNORMAL LOW (ref 39.0–52.0)
Hemoglobin: 12.3 g/dL — ABNORMAL LOW (ref 13.0–17.0)
MCH: 31.9 pg (ref 26.0–34.0)
MCHC: 33 g/dL (ref 30.0–36.0)
MCV: 96.6 fL (ref 80.0–100.0)
Platelets: 233 10*3/uL (ref 150–400)
RBC: 3.86 MIL/uL — ABNORMAL LOW (ref 4.22–5.81)
RDW: 13.5 % (ref 11.5–15.5)
WBC: 8.1 10*3/uL (ref 4.0–10.5)
nRBC: 0 % (ref 0.0–0.2)

## 2019-11-27 MED ORDER — ACETAMINOPHEN 325 MG PO TABS: 650.0000 mg | ORAL_TABLET | Freq: Four times a day (QID) | ORAL | Status: DC | PRN

## 2019-11-27 NOTE — Progress Notes (Signed)
ABI and LEA duplex completed.  Refer to [facility] under chart review to view preliminary results.   11/27/2019  11:59 AM Akil Hoos, Bonnye Fava

## 2019-11-27 NOTE — Progress Notes (Signed)
Pt off floor getting vascular lab studies.  Will let Dr Donzetta Matters know of pt admission and he will follow up tomorrow.  Ruta Hinds, MD [facility] [place] Office: [phone]

## 2019-11-27 NOTE — Progress Notes (Signed)
PROGRESS NOTE    Jason [name]  ACZ:[id number] DOB: 01-Jan-1951 DOA: 11/25/2019 PCP: Horald Pollen, MD    Brief Narrative:  69 y.o. male with medical history significant for PVD s/p R fem-pop bypass, COPD, CAD s/p CABG, T2DM, HTN, HLD who presents with worsening bilateral lower extremity pain.  States worsening pain has been ongoing for the past month.  Worse with ambulation and better at rest.  Also noted left foot heel wound and also an ulcer on the plantar surface of the right foot.  Patient continues to be a current smoker of at least 1 pack/day.  Denies any fever.   ED Course: He was intermittently hypotensive with improvement with IV fluids.  Lab work notable for elevated creatinine 1.29 from a prior of 1.18.  Sodium of 129.  Mild leukocytosis of 10.9. No signs of necrosis on feet but patient did not have palpable or doppler findings of posterior tibial or dorsalis pedis pulse on the left side.  Right foot x-ray was negative for osteomyelitis but had incidental finding of fourth toe fracture.  ABI in the ED showed left lower extremity of 0.68 and right foot 0.81.  Last left ABI in February 2021 was 0.24.  ED physician Dr. Rex Kras discussed case with vascular surgeon Dr. Oneida Alar  Assessment & Plan:   Principal Problem:   Critical lower limb ischemia Active Problems:   DM (diabetes mellitus) ([place])   CAD, CABG Feb 2011, cath x 4 since-medical Rx   PVD, hx Rt femoral endarterectomy 2004   Tobacco abuse   Hyponatremia   Hypotension   Hypothyroidism   Wound of lower extremity   Toe fracture, right   Bilateral lower extremity claudication/Left ischemic limb with history of PVD s/p right femoropopliteal bypass - ABI in the ED with left lower extremity of 0.68 and right foot 0.81.  Last left ABI in February 2021 was 0.24.  No palpable or Doppler findings of PT or DP pulses on left side. -Patient is continued on aspirin and Plavix -Vascular Surgery following. Pt  now s/p ABI with LEA duplex study per Vascular -Vascular to follow up, potential for surgery this week noted -Pt reports much improvement with trial of neurontin. Will continue  Left great toe nail, heel, lateral wound/ Right plantar ulcer wound at base of 4th MTP -Wound care now consulted  Right 4th toe fracture -Cont with rigid sole shoes for ambulation  Hypotension -Suspect secondary to hypovolemia.  -BP now improved and controlled at this time -Continued on metoprolol, albeit at lower dose for now with hold parameters  AKI -Patient received 1 L normal saline fluid in the ED.  Avoid nephrotoxic agents. -Cr stable at 1.14 -Repeat bmet in AM  Hyponatremia -Likely secondary to hypovolemia. -Improved with IVF hydration -Recheck renal panel in AM  Tobacco abuse -continued on nicotine patch  Type 2 diabetes -Continue with sensitive sliding scale -A1c of 7.3 -Glucose trends overall stable with exception of one reading in the 200's  History of CAD s/p CABG -Continue Ranexa, statin as pt tolerates  Hypothyroidism -Continue levothyroxine as pt tolerates  DVT prophylaxis: Lovenox subq Code Status: DNR Family Communication: Pt in room, family not at bedside  Status is: Inpatient  Remains inpatient appropriate because:Ongoing diagnostic testing needed not appropriate for outpatient work up   Dispo: The patient is from: Home              Anticipated d/c is to: Home  Anticipated d/c date is: > 3 days              Patient currently is not medically stable to d/c.   Consultants:   Vascular Surgery  Procedures:     Antimicrobials: Anti-infectives (From admission, onward)   None      Subjective: Reports pain now much improved with neurontin  Objective: Vitals:   11/26/19 1630 11/26/19 2113 11/27/19 0458 11/27/19 0905  BP: (!) 134/57 136/65 (!) 123/47 (!) 126/52  Pulse: 72 68 67 85  Resp: 18 17 17 18   Temp: 98.6 F (37 C) 98.5 F  (36.9 C) 97.7 F (36.5 C) 98 F (36.7 C)  TempSrc: Oral Oral Oral Oral  SpO2: 97% 99% 100% 100%  Weight:  76.4 kg    Height:        Intake/Output Summary (Last 24 hours) at 11/27/2019 1522 Last data filed at 11/27/2019 1327 Gross per 24 hour  Intake 1080 ml  Output 675 ml  Net 405 ml   Filed Weights   11/25/19 1639 11/26/19 0016 11/26/19 2113  Weight: 71.2 kg 76.4 kg 76.4 kg    Examination: General exam: Awake, laying in bed, in nad Respiratory system: Normal respiratory effort, no wheezing Cardiovascular system: regular rate, s1, s2 Gastrointestinal system: Soft, nondistended, positive BS Central nervous system: CN2-12 grossly intact, strength intact Extremities: Perfused, no clubbing Skin: Normal skin turgor, no notable skin lesions seen Psychiatry: Mood normal // no visual hallucinations   Data Reviewed: I have personally reviewed following labs and imaging studies  CBC: Recent Labs  Lab 11/25/19 1810 11/26/19 0304 11/27/19 0655  WBC 10.9* 10.8* 8.1  NEUTROABS 8.3*  --   --   HGB 11.9* 12.1* 12.3*  HCT 34.7* 36.3* 37.3*  MCV 94.6 95.5 96.6  PLT 228 253 960   Basic Metabolic Panel: Recent Labs  Lab 11/25/19 1810 11/26/19 0304 11/27/19 0655  NA 129* 132* 132*  K 4.1 4.4 4.0  CL 96* 101 97*  CO2 22 21* 22  GLUCOSE 198* 125* 139*  BUN 23 15 13   CREATININE 1.29* 1.13 1.14  CALCIUM 9.5 10.0 9.7   GFR: Estimated Creatinine Clearance: 62.9 mL/min (by C-G formula based on SCr of 1.14 mg/dL). Liver Function Tests: Recent Labs  Lab 11/27/19 0655  AST 12*  ALT 14  ALKPHOS 75  BILITOT 0.4  PROT 7.3  ALBUMIN 3.7   No results for input(s): LIPASE, AMYLASE in the last 168 hours. No results for input(s): AMMONIA in the last 168 hours. Coagulation Profile: No results for input(s): INR, PROTIME in the last 168 hours. Cardiac Enzymes: No results for input(s): CKTOTAL, CKMB, CKMBINDEX, TROPONINI in the last 168 hours. BNP (last 3 results) No results for  input(s): PROBNP in the last 8760 hours. HbA1C: Recent Labs    11/26/19 0304  HGBA1C 7.3*   CBG: Recent Labs  Lab 11/26/19 1151 11/26/19 1629 11/26/19 2114 11/27/19 0647 11/27/19 1125  GLUCAP 228* 155* 206* 139* 122*   Lipid Profile: No results for input(s): CHOL, HDL, LDLCALC, TRIG, CHOLHDL, LDLDIRECT in the last 72 hours. Thyroid Function Tests: No results for input(s): TSH, T4TOTAL, FREET4, T3FREE, THYROIDAB in the last 72 hours. Anemia Panel: No results for input(s): VITAMINB12, FOLATE, FERRITIN, TIBC, IRON, RETICCTPCT in the last 72 hours. Sepsis Labs: Recent Labs  Lab 11/25/19 1810 11/26/19 0304 11/26/19 0704  LATICACIDVEN 2.1* 1.4 1.1    Recent Results (from the past 240 hour(s))  SARS Coronavirus 2 by RT PCR (hospital order,  performed in [facility] hospital lab) Nasopharyngeal Nasopharyngeal Swab     Status: None   Collection Time: 11/25/19  7:50 PM   Specimen: Nasopharyngeal Swab  Result Value Ref Range Status   SARS Coronavirus 2 NEGATIVE NEGATIVE Final    Comment: (NOTE) SARS-CoV-2 target nucleic acids are NOT DETECTED. The SARS-CoV-2 RNA is generally detectable in upper and lower respiratory specimens during the acute phase of infection. The lowest concentration of SARS-CoV-2 viral copies this assay can detect is 250 copies / mL. A negative result does not preclude SARS-CoV-2 infection and should not be used as the sole basis for treatment or other patient management decisions.  A negative result may occur with improper specimen collection / handling, submission of specimen other than nasopharyngeal swab, presence of viral mutation(s) within the areas targeted by this assay, and inadequate number of viral copies (<250 copies / mL). A negative result must be combined with clinical observations, patient history, and epidemiological information. Fact Sheet for Patients:   StrictlyIdeas.no Fact Sheet for Healthcare  Providers: BankingDealers.co.za This test is not yet approved or cleared  by the [place] FDA and has been authorized for detection and/or diagnosis of SARS-CoV-2 by FDA under an Emergency Use Authorization (EUA).  This EUA will remain in effect (meaning this test can be used) for the duration of the COVID-19 declaration under Section 564(b)(1) of the Act, 21 U.S.C. section 360bbb-3(b)(1), unless the authorization is terminated or revoked sooner. Performed at [facility], [address], [place], [place] [address]      Radiology Studies: DG Foot Complete Right  Result Date: 11/25/2019 CLINICAL DATA:  Right foot ulcer.  Evaluate for osteomyelitis EXAM: RIGHT FOOT COMPLETE - 3+ VIEW COMPARISON:  06/11/2019 FINDINGS: Prior 1st and 2nd toe transmetatarsal amputation. Stable appearance. Subluxation of the 3rd MTP joint medially. There is a fracture within the proximal phalanx of the 4th toe. This is new since prior study. No definite bone destruction seen to suggest osteomyelitis. IMPRESSION: First and 2nd toe amputation. Subluxation of the 3rd MTP joint. Fracture at the 4th toe proximal phalanx. Electronically Signed   By: Rolm Baptise M.D.   On: 11/25/2019 18:50   VAS [place] ABI WITH/WO TBI  Result Date: 11/27/2019 LOWER EXTREMITY DOPPLER STUDY Indications: Rest pain, and non healing left great toe wound. High Risk Factors: Hypertension, hyperlipidemia, Diabetes, current smoker.  Vascular Interventions: Right femoropopliteal bypass graft and right great toe                         amputation -06/02/19. Performing Technologist: Oda Cogan Chief Tech  Examination Guidelines: A complete evaluation includes at minimum, Doppler waveform signals and systolic blood pressure reading at the level of bilateral brachial, anterior tibial, and posterior tibial arteries, when vessel segments are accessible. Bilateral testing is considered an integral part of a complete  examination. Photoelectric Plethysmograph (PPG) waveforms and toe systolic pressure readings are included as required and additional duplex testing as needed. Limited examinations for reoccurring indications may be performed as noted.  ABI Findings: +---------+------------------+-----+----------+--------+ Right    Rt Pressure (mmHg)IndexWaveform  Comment  +---------+------------------+-----+----------+--------+ Brachial 137                    triphasic          +---------+------------------+-----+----------+--------+ ATA      81                0.58 monophasic         +---------+------------------+-----+----------+--------+  PTA      89                0.64                    +---------+------------------+-----+----------+--------+ PERO                            monophasic         +---------+------------------+-----+----------+--------+ Great Toe                       Absent             +---------+------------------+-----+----------+--------+ +--------+------------------+-----+----------+-------+ Left    Lt Pressure (mmHg)IndexWaveform  Comment +--------+------------------+-----+----------+-------+ [id number]                    triphasic         +--------+------------------+-----+----------+-------+ ATA     0                 0.00 absent            +--------+------------------+-----+----------+-------+ PTA     0                 0.00 absent            +--------+------------------+-----+----------+-------+ PERO    41                0.29 monophasic        +--------+------------------+-----+----------+-------+ +-------+-----------+-----------+------------+------------+ ABI/TBIToday's ABIToday's TBIPrevious ABIPrevious TBI +-------+-----------+-----------+------------+------------+ Right  0.64       N/A        0.85        N/A          +-------+-----------+-----------+------------+------------+ Left   0.29       N/A        0.24        N/A           +-------+-----------+-----------+------------+------------+ Right ABIs appear decreased compared to prior study on 07/29/19. Left ABIs appear essentially unchanged compared to prior study on 07/29/19.  Summary: Right: Resting right ankle-brachial index indicates moderate right lower extremity arterial disease. Left: Resting left ankle-brachial index indicates critical left limb ischemia.  *See table(s) above for measurements and observations.     Preliminary    VAS [place] LOWER EXTREMITY ARTERIAL DUPLEX  Result Date: 11/27/2019 LOWER EXTREMITY ARTERIAL DUPLEX STUDY Indications: Rest pain, and ulceration. High Risk Factors: Hypertension, hyperlipidemia, Diabetes, current smoker,                    coronary artery disease.  Vascular Interventions: Right femoropopliteal bypass graft and right great toe                         amputation 06/02/19. Current ABI:            Right 0.26, left 0.29 Performing Technologist: Oda Cogan Chief Tech  Examination Guidelines: A complete evaluation includes B-mode imaging, spectral Doppler, color Doppler, and power Doppler as needed of all accessible portions of each vessel. Bilateral testing is considered an integral part of a complete examination. Limited examinations for reoccurring indications may be performed as noted.  +-----------+--------+-----+--------+----------+--------+ RIGHT      PSV cm/sRatioStenosisWaveform  Comments +-----------+--------+-----+--------+----------+--------+ ATA Distal 62                   monophasic         +-----------+--------+-----+--------+----------+--------+  PTA Prox                occluded                   +-----------+--------+-----+--------+----------+--------+ PTA Mid                 occluded                   +-----------+--------+-----+--------+----------+--------+ PTA Distal 12                                      +-----------+--------+-----+--------+----------+--------+ PERO Distal62                    monophasic         +-----------+--------+-----+--------+----------+--------+  Right Graft #1: +------------------+--------+--------+----------+--------+                   PSV cm/sStenosisWaveform  Comments +------------------+--------+--------+----------+--------+ Inflow            86              monophasic         +------------------+--------+--------+----------+--------+ Prox Anastomosis  117             monophasic         +------------------+--------+--------+----------+--------+ Proximal Graft    52              monophasic         +------------------+--------+--------+----------+--------+ Mid Graft         45              monophasic         +------------------+--------+--------+----------+--------+ Distal Graft      55              monophasic         +------------------+--------+--------+----------+--------+ Distal Anastomosis56              monophasic         +------------------+--------+--------+----------+--------+ Outflow           141             monophasic         +------------------+--------+--------+----------+--------+ Patent graft with monophasic flow throughout.  +-----------+--------+-----+--------+----------+-----------------+ LEFT       PSV cm/sRatioStenosisWaveform  Comments          +-----------+--------+-----+--------+----------+-----------------+ CFA Distal 118                  monophasic                  +-----------+--------+-----+--------+----------+-----------------+ DFA        153                  monophasic                  +-----------+--------+-----+--------+----------+-----------------+ SFA Prox   65                   monophasic                  +-----------+--------+-----+--------+----------+-----------------+ SFA Mid    62                   monophasic                  +-----------+--------+-----+--------+----------+-----------------+ SFA Distal 51  monophasic                   +-----------+--------+-----+--------+----------+-----------------+ POP Prox                        monophasic                  +-----------+--------+-----+--------+----------+-----------------+ POP Distal 57                   monophasic                  +-----------+--------+-----+--------+----------+-----------------+ ATA Prox                occluded                            +-----------+--------+-----+--------+----------+-----------------+ ATA Mid                 occluded                            +-----------+--------+-----+--------+----------+-----------------+ ATA Distal              occluded                            +-----------+--------+-----+--------+----------+-----------------+ PTA Prox   26                   monophasic                  +-----------+--------+-----+--------+----------+-----------------+ PTA Mid    0            occluded                            +-----------+--------+-----+--------+----------+-----------------+ PTA Distal 0            occluded                            +-----------+--------+-----+--------+----------+-----------------+ PERO Mid   22                   monophasicseverely dampened +-----------+--------+-----+--------+----------+-----------------+ PERO Distal20                   monophasicseverly dampened  +-----------+--------+-----+--------+----------+-----------------+     Preliminary     Scheduled Meds: . aspirin EC  81 mg Oral Daily  . clopidogrel  75 mg Oral Daily  . enoxaparin (LOVENOX) injection  40 mg Subcutaneous Q24H  . gabapentin  300 mg Oral BID  . insulin aspart  0-9 Units Subcutaneous TID WC  . levothyroxine  50 mcg Oral Daily  . metoprolol tartrate  12.5 mg Oral BID  . nicotine  21 mg Transdermal Daily  . ranolazine  1,000 mg Oral BID  . rosuvastatin  20 mg Oral Daily  . sertraline  100 mg Oral Daily   Continuous Infusions:   LOS: 2 days   Marylu Lund,  MD [facility] Pager On Amion  If 7PM-7AM, please contact night-coverage 11/27/2019, 3:22 PM

## 2019-11-27 NOTE — Plan of Care (Signed)
  Problem: Education: Goal: Knowledge of General Education information will improve Description: Including pain rating scale, medication(s)/side effects and non-pharmacologic comfort measures Outcome: Progressing   Problem: Pain Managment: Goal: General experience of comfort will improve Outcome: Progressing   

## 2019-11-28 ENCOUNTER — Encounter (HOSPITAL_COMMUNITY)
Admission: EM | Disposition: A | Discharge status: Skilled Nursing Facility | Payer: Self-pay | Source: Home / Self Care | Attending: Internal Medicine

## 2019-11-28 ENCOUNTER — Other Ambulatory Visit: Payer: Self-pay

## 2019-11-28 DIAGNOSIS — E034 Atrophy of thyroid (acquired): Secondary | ICD-10-CM

## 2019-11-28 HISTORY — PX: PERIPHERAL VASCULAR INTERVENTION: CATH118257

## 2019-11-28 HISTORY — PX: LOWER EXTREMITY ANGIOGRAPHY: CATH118251

## 2019-11-28 HISTORY — PX: PERIPHERAL VASCULAR BALLOON ANGIOPLASTY: CATH118281

## 2019-11-28 LAB — CBC
HCT: 37.2 % — ABNORMAL LOW (ref 39.0–52.0)
Hemoglobin: 12.3 g/dL — ABNORMAL LOW (ref 13.0–17.0)
MCH: 31.6 pg (ref 26.0–34.0)
MCHC: 33.1 g/dL (ref 30.0–36.0)
MCV: 95.6 fL (ref 80.0–100.0)
Platelets: 274 10*3/uL (ref 150–400)
RBC: 3.89 MIL/uL — ABNORMAL LOW (ref 4.22–5.81)
RDW: 13.2 % (ref 11.5–15.5)
WBC: 8.5 10*3/uL (ref 4.0–10.5)
nRBC: 0 % (ref 0.0–0.2)

## 2019-11-28 LAB — GLUCOSE, CAPILLARY
Glucose-Capillary: 123 mg/dL — ABNORMAL HIGH (ref 70–99)
Glucose-Capillary: 128 mg/dL — ABNORMAL HIGH (ref 70–99)
Glucose-Capillary: 135 mg/dL — ABNORMAL HIGH (ref 70–99)
Glucose-Capillary: 101 mg/dL — ABNORMAL HIGH (ref 70–99)

## 2019-11-28 LAB — COMPREHENSIVE METABOLIC PANEL
ALT: 17 U/L (ref 0–44)
AST: 16 U/L (ref 15–41)
Albumin: 3.7 g/dL (ref 3.5–5.0)
Alkaline Phosphatase: 83 U/L (ref 38–126)
Anion gap: 10 (ref 5–15)
BUN: 13 mg/dL (ref 8–23)
CO2: 22 mmol/L (ref 22–32)
Calcium: 9.3 mg/dL (ref 8.9–10.3)
Chloride: 98 mmol/L (ref 98–111)
Creatinine, Ser: 1.11 mg/dL (ref 0.61–1.24)
GFR calc Af Amer: >60 mL/min (ref >60)
GFR calc non Af Amer: >60 mL/min (ref >60)
Glucose, Bld: 131 mg/dL — ABNORMAL HIGH (ref 70–99)
Potassium: 3.9 mmol/L (ref 3.5–5.1)
Sodium: 130 mmol/L — ABNORMAL LOW (ref 135–145)
Total Bilirubin: 0.7 mg/dL (ref 0.3–1.2)
Total Protein: 7.3 g/dL (ref 6.5–8.1)

## 2019-11-28 LAB — URINALYSIS, ROUTINE W REFLEX MICROSCOPIC
Bacteria, UA: NONE SEEN
Bilirubin Urine: NEGATIVE
Glucose, UA: NEGATIVE mg/dL
Ketones, ur: NEGATIVE mg/dL
Leukocytes,Ua: NEGATIVE
Nitrite: NEGATIVE
Protein, ur: NEGATIVE mg/dL
Specific Gravity, Urine: >1.046 — ABNORMAL HIGH (ref 1.005–1.030)
pH: 6 (ref 5.0–8.0)

## 2019-11-28 LAB — POCT ACTIVATED CLOTTING TIME
Activated Clotting Time: 208 seconds
Activated Clotting Time: 169 seconds

## 2019-11-28 SURGERY — LOWER EXTREMITY ANGIOGRAPHY
Anesthesia: LOCAL | Laterality: Right

## 2019-11-28 MED ORDER — CHLORHEXIDINE GLUCONATE 4 % EX LIQD
60.0000 mL | Freq: Once | CUTANEOUS | Status: AC
  Administered 2019-11-29: 4 via TOPICAL
  Filled 2019-11-28: qty 60

## 2019-11-28 MED ORDER — HEPARIN SODIUM (PORCINE) 1000 UNIT/ML IJ SOLN
INTRAMUSCULAR | Status: DC | PRN
  Administered 2019-11-28: 8000 [IU] via INTRAVENOUS

## 2019-11-28 MED ORDER — SODIUM CHLORIDE 0.9% FLUSH: 3.0000 mL | INTRAVENOUS | Status: DC | PRN

## 2019-11-28 MED ORDER — FENTANYL CITRATE (PF) 100 MCG/2ML IJ SOLN
INTRAMUSCULAR | Status: AC
  Filled 2019-11-28: qty 2

## 2019-11-28 MED ORDER — IODIXANOL 320 MG/ML IV SOLN
INTRAVENOUS | Status: DC | PRN
  Administered 2019-11-28: 165 mL via INTRA_ARTERIAL

## 2019-11-28 MED ORDER — LIDOCAINE HCL (PF) 1 % IJ SOLN
INTRAMUSCULAR | Status: AC
  Filled 2019-11-28: qty 30

## 2019-11-28 MED ORDER — LIDOCAINE HCL (PF) 1 % IJ SOLN
INTRAMUSCULAR | Status: DC | PRN
  Administered 2019-11-28: 18 mL via INTRADERMAL

## 2019-11-28 MED ORDER — OXYCODONE HCL 5 MG PO TABS
5.0000 mg | ORAL_TABLET | ORAL | Status: DC | PRN
  Administered 2019-11-28: 5 mg via ORAL
  Administered 2019-11-29 – 2019-12-07 (×19): 10 mg via ORAL
  Filled 2019-11-28 (×12): qty 2
  Filled 2019-11-28: qty 1
  Filled 2019-11-28 (×4): qty 2
  Filled 2019-11-28: qty 1
  Filled 2019-11-28 (×2): qty 2
  Filled 2019-11-28: qty 1

## 2019-11-28 MED ORDER — SODIUM CHLORIDE 0.9% FLUSH
3.0000 mL | Freq: Two times a day (BID) | INTRAVENOUS | Status: DC
  Administered 2019-11-28 – 2019-11-29 (×2): 3 mL via INTRAVENOUS

## 2019-11-28 MED ORDER — ONDANSETRON HCL 4 MG/2ML IJ SOLN: 4.0000 mg | Freq: Four times a day (QID) | INTRAMUSCULAR | Status: DC | PRN

## 2019-11-28 MED ORDER — CHLORHEXIDINE GLUCONATE 4 % EX LIQD
60.0000 mL | Freq: Once | CUTANEOUS | Status: AC
  Administered 2019-11-28: 4 via TOPICAL
  Filled 2019-11-28: qty 60

## 2019-11-28 MED ORDER — CEFAZOLIN SODIUM-DEXTROSE 2-4 GM/100ML-% IV SOLN
2.0000 g | INTRAVENOUS | Status: AC
  Administered 2019-11-29: 2 g via INTRAVENOUS
  Filled 2019-11-28 (×2): qty 100

## 2019-11-28 MED ORDER — HEPARIN (PORCINE) IN NACL 1000-0.9 UT/500ML-% IV SOLN
INTRAVENOUS | Status: AC
  Filled 2019-11-28: qty 1000

## 2019-11-28 MED ORDER — SODIUM CHLORIDE 0.9 % IV SOLN: INTRAVENOUS | Status: DC

## 2019-11-28 MED ORDER — HYDRALAZINE HCL 20 MG/ML IJ SOLN: 5.0000 mg | INTRAMUSCULAR | Status: DC | PRN

## 2019-11-28 MED ORDER — HYDROMORPHONE HCL 1 MG/ML IJ SOLN: 0.5000 mg | INTRAMUSCULAR | Status: DC | PRN

## 2019-11-28 MED ORDER — HEPARIN (PORCINE) IN NACL 1000-0.9 UT/500ML-% IV SOLN
INTRAVENOUS | Status: DC | PRN
  Administered 2019-11-28 (×2): 500 mL

## 2019-11-28 MED ORDER — HEPARIN SODIUM (PORCINE) 1000 UNIT/ML IJ SOLN
INTRAMUSCULAR | Status: AC
  Filled 2019-11-28: qty 1

## 2019-11-28 MED ORDER — SODIUM CHLORIDE 0.9 % IV SOLN: 250.0000 mL | INTRAVENOUS | Status: DC | PRN

## 2019-11-28 MED ORDER — SODIUM CHLORIDE 0.9 % IV SOLN: INTRAVENOUS | Status: AC

## 2019-11-28 MED ORDER — ACETAMINOPHEN 325 MG PO TABS: 650.0000 mg | ORAL_TABLET | ORAL | Status: DC | PRN

## 2019-11-28 MED ORDER — FENTANYL CITRATE (PF) 100 MCG/2ML IJ SOLN
INTRAMUSCULAR | Status: DC | PRN
  Administered 2019-11-28: 25 ug via INTRAVENOUS

## 2019-11-28 SURGICAL SUPPLY — 19 items
BALLN CHOCOLATE 6.0X40X120 (BALLOONS) ×4
BALLOON CHOCOLATE 6.0X40X120 (BALLOONS) IMPLANT
CATH ANGIO 5F BER2 65CM (CATHETERS) ×1 IMPLANT
CATH OMNI FLUSH 5F 65CM (CATHETERS) ×1 IMPLANT
DEVICE CONTINUOUS FLUSH (MISCELLANEOUS) ×1 IMPLANT
DEVICE TORQUE .025-.038 (MISCELLANEOUS) ×1 IMPLANT
GLIDEWIRE ADV .035X180CM (WIRE) ×1 IMPLANT
KIT ENCORE 26 ADVANTAGE (KITS) ×1 IMPLANT
KIT MICROPUNCTURE NIT STIFF (SHEATH) ×1 IMPLANT
KIT PV (KITS) ×4 IMPLANT
SHEATH FLEX ANSEL ANG 6F 45CM (SHEATH) ×1 IMPLANT
SHEATH PINNACLE 5F 10CM (SHEATH) ×1 IMPLANT
SHEATH PROBE COVER 6X72 (BAG) ×1 IMPLANT
STENT INNOVA 6X40X130 (Permanent Stent) ×1 IMPLANT
SYR MEDRAD MARK V 150ML (SYRINGE) ×1 IMPLANT
TRANSDUCER W/STOPCOCK (MISCELLANEOUS) ×4 IMPLANT
TRAY PV CATH (CUSTOM PROCEDURE TRAY) ×4 IMPLANT
WIRE BENTSON .035X145CM (WIRE) ×1 IMPLANT
WIRE G V18X300CM (WIRE) ×1 IMPLANT

## 2019-11-28 NOTE — Progress Notes (Addendum)
[facility]  Subjective  - States Neurontin seems to help some.   Objective (!) 118/53 70 98.2 F (36.8 C) 18 100%  Intake/Output Summary (Last 24 hours) at 11/28/2019 0709 Last data filed at 11/28/2019 0200 Gross per 24 hour  Intake 1080 ml  Output 925 ml  Net 155 ml    B Feet warm with intact motor Left peroneal signal only, left heel pain with laceration. Lungs non labored breathing    ABI Findings:  +---------+------------------+-----+----------+--------+  Right  Rt Pressure (mmHg)IndexWaveform Comment   +---------+------------------+-----+----------+--------+  Brachial 137           triphasic       +---------+------------------+-----+----------+--------+  ATA   81        0.58 monophasic      +---------+------------------+-----+----------+--------+  PTA   89        0.64            +---------+------------------+-----+----------+--------+  PERO               monophasic      +---------+------------------+-----+----------+--------+  Great Toe            Absent        +---------+------------------+-----+----------+--------+   +--------+------------------+-----+----------+-------+  Left  Lt Pressure (mmHg)IndexWaveform Comment  +--------+------------------+-----+----------+-------+  [id number]           triphasic       +--------+------------------+-----+----------+-------+  ATA   0         0.00 absent        +--------+------------------+-----+----------+-------+  PTA   0         0.00 absent        +--------+------------------+-----+----------+-------+  PERO  41        0.29 monophasic      +--------+------------------+-----+----------+-------+   +-------+-----------+-----------+------------+------------+  ABI/TBIToday's  ABIToday's TBIPrevious ABIPrevious TBI  +-------+-----------+-----------+------------+------------+  Right 0.64    N/A    0.85    N/A       +-------+-----------+-----------+------------+------------+  Left  0.29    N/A    0.24    N/A       +-------+-----------+-----------+------------+------------+     Right ABIs appear decreased compared to prior study on 07/29/19. Left ABIs  appear essentially unchanged compared to prior study on 07/29/19.    Summary:  Right: Resting right ankle-brachial index indicates moderate right lower  extremity arterial disease.   Left: Resting left ankle-brachial index indicates critical left limb  ischemia.     +-----------+--------+-----+--------+----------+--------+  RIGHT   PSV cm/sRatioStenosisWaveform Comments  +-----------+--------+-----+--------+----------+--------+  ATA Distal 62          monophasic      +-----------+--------+-----+--------+----------+--------+  PTA Prox         occluded           +-----------+--------+-----+--------+----------+--------+  PTA Mid         occluded           +-----------+--------+-----+--------+----------+--------+  PTA Distal 12                     +-----------+--------+-----+--------+----------+--------+  PERO Distal62          monophasic      +-----------+--------+-----+--------+----------+--------+       Right Graft #1:  +------------------+--------+--------+----------+--------+           PSV cm/sStenosisWaveform Comments  +------------------+--------+--------+----------+--------+  Inflow      86       monophasic      +------------------+--------+--------+----------+--------+  Prox Anastomosis 117  monophasic      +------------------+--------+--------+----------+--------+   Proximal Graft  52       monophasic      +------------------+--------+--------+----------+--------+  Mid Graft     45       monophasic      +------------------+--------+--------+----------+--------+  Distal Graft   55       monophasic      +------------------+--------+--------+----------+--------+  Distal Anastomosis56       monophasic      +------------------+--------+--------+----------+--------+  Outflow      141       monophasic      +------------------+--------+--------+----------+--------+   Patent graft with monophasic flow throughout.     +-----------+--------+-----+--------+----------+-----------------+  LEFT    PSV cm/sRatioStenosisWaveform Comments       +-----------+--------+-----+--------+----------+-----------------+  CFA Distal 118          monophasic           +-----------+--------+-----+--------+----------+-----------------+  DFA    153          monophasic           +-----------+--------+-----+--------+----------+-----------------+  SFA Prox  65          monophasic           +-----------+--------+-----+--------+----------+-----------------+  SFA Mid  62          monophasic           +-----------+--------+-----+--------+----------+-----------------+  SFA Distal 51          monophasic           +-----------+--------+-----+--------+----------+-----------------+  POP Prox             monophasic           +-----------+--------+-----+--------+----------+-----------------+  POP Distal 57          monophasic           +-----------+--------+-----+--------+----------+-----------------+  ATA Prox         occluded                 +-----------+--------+-----+--------+----------+-----------------+  ATA Mid         occluded                +-----------+--------+-----+--------+----------+-----------------+  ATA Distal        occluded                +-----------+--------+-----+--------+----------+-----------------+  PTA Prox  26          monophasic           +-----------+--------+-----+--------+----------+-----------------+  PTA Mid  0       occluded                +-----------+--------+-----+--------+----------+-----------------+  PTA Distal 0       occluded                +-----------+--------+-----+--------+----------+-----------------+  PERO Mid  22          monophasicseverely dampened  +-----------+--------+-----+--------+----------+-----------------+  PERO Distal20          monophasicseverly dampened   +-----------+--------+-----+--------+----------+-----------------+        Summary:  Right: Right femoropopliteal bypass graft is open and patent with  monophasic flow throughout.   Left: Left common femoral artery, superficial femoral artery, profunda  femoral artery and popliteal artery are patent with monophasic flow  without significant stenosis. The posterior tibial and anterior tibial  arteries appears occluded. Peroneal artery  appears patent with severly dampened flow.   Assessment/Planning: Right patent graft with monophasic flow throughout.  Left: Left common femoral artery, superficial femoral artery, profunda  femoral artery and popliteal artery are patent with monophasic flow  without significant stenosis. The posterior tibial and anterior tibial  arteries appears occluded. Peroneal artery  appears patent with severly dampened flow.  Pending angiogram today with possible intervention and plan for left LE possible  bypass. NPO   Roxy Horseman 11/28/2019 7:09 AM --  Laboratory Lab Results: Recent Labs    11/26/19 0304 11/27/19 0655  WBC 10.8* 8.1  HGB 12.1* 12.3*  HCT 36.3* 37.3*  PLT 253 233   BMET Recent Labs    11/26/19 0304 11/27/19 0655  NA 132* 132*  K 4.4 4.0  CL 101 97*  CO2 21* 22  GLUCOSE 125* 139*  BUN 15 13  CREATININE 1.13 1.14  CALCIUM 10.0 9.7    COAG Lab Results  Component Value Date   INR 1.01 10/23/2016   INR 0.94 03/21/2015   INR 0.97 03/09/2015   No results found for: PTT  I have independently interviewed and examined patient and agree with PA assessment and plan above. Plan for angiogram today with possible intervention and possible bypass tomorrow in OR.   Joziyah Roblero C. Donzetta Matters, MD [facility] Office: [phone] Pager: [phone]

## 2019-11-28 NOTE — Progress Notes (Signed)
PROGRESS NOTE    Jason [name]  TMH:[id number] DOB: 1950-08-11 DOA: 11/25/2019 PCP: Horald Pollen, MD    Brief Narrative:  69 y.o. male with medical history significant for PVD s/p R fem-pop bypass, COPD, CAD s/p CABG, T2DM, HTN, HLD who presents with worsening bilateral lower extremity pain.  States worsening pain has been ongoing for the past month.  Worse with ambulation and better at rest.  Also noted left foot heel wound and also an ulcer on the plantar surface of the right foot.  Patient continues to be a current smoker of at least 1 pack/day.  Denies any fever.   ED Course: He was intermittently hypotensive with improvement with IV fluids.  Lab work notable for elevated creatinine 1.29 from a prior of 1.18.  Sodium of 129.  Mild leukocytosis of 10.9. No signs of necrosis on feet but patient did not have palpable or doppler findings of posterior tibial or dorsalis pedis pulse on the left side.  Right foot x-ray was negative for osteomyelitis but had incidental finding of fourth toe fracture.  ABI in the ED showed left lower extremity of 0.68 and right foot 0.81.  Last left ABI in February 2021 was 0.24.  ED physician Dr. Rex Kras discussed case with vascular surgeon Dr. Oneida Alar  Assessment & Plan:   Principal Problem:   Critical lower limb ischemia Active Problems:   DM (diabetes mellitus) ([place])   CAD, CABG Feb 2011, cath x 4 since-medical Rx   PVD, hx Rt femoral endarterectomy 2004   Tobacco abuse   Hyponatremia   Hypotension   Hypothyroidism   Wound of lower extremity   Toe fracture, right   Bilateral lower extremity claudication/Left ischemic limb with history of PVD s/p right femoropopliteal bypass - ABI in the ED with left lower extremity of 0.68 and right foot 0.81.  Last left ABI in February 2021 was 0.24.  No palpable or Doppler findings of PT or DP pulses on left side. -Patient is continued on aspirin and Plavix -Vascular Surgery following. Pt  now s/p ABI with LEA duplex study per Vascular -Vascular following, underwent US guided cannulation of L common femoral, aortogram with B LE runoff, angioplasty of R common femoral and stenting to the L external iliac -Pt had earlier reported improvement with trial of neurontin.   Left great toe nail, heel, lateral wound/ Right plantar ulcer wound at base of 4th MTP -Wound care now consulted  Right 4th toe fracture -Cont with rigid sole shoes for ambulation  Hypotension -Suspect secondary to hypovolemia.  -BP now improved and controlled at this time -Continued on metoprolol, albeit at lower dose for now with hold parameters -Stable at this time  AKI -Patient received 1 L normal saline fluid in the ED.  Avoid nephrotoxic agents. -Cr stable at 1.14 -follow bmet in AM  Hyponatremia -Likely secondary to hypovolemia. -Improved with IVF hydration -Recheck renal panel in AM  Tobacco abuse -continued on nicotine patch  Type 2 diabetes -Continue with sensitive sliding scale -A1c of 7.3 -Glucose trends overall stable with exception of one reading in the 200's  History of CAD s/p CABG -Continue Ranexa, statin as pt tolerates  Hypothyroidism -Continue levothyroxine as pt tolerates  DVT prophylaxis: Lovenox subq Code Status: DNR Family Communication: Pt in room, family not at bedside  Status is: Inpatient  Remains inpatient appropriate because:Ongoing diagnostic testing needed not appropriate for outpatient work up   Dispo: The patient is from: Home  Anticipated d/c is to: Home              Anticipated d/c date is: > 3 days              Patient currently is not medically stable to d/c.  Consultants:   Vascular Surgery  Procedures:     Antimicrobials: Anti-infectives (From admission, onward)   None      Subjective: States LLE pain improved since starting neurontin  Objective: Vitals:   11/28/19 1440 11/28/19 1445 11/28/19 1450 11/28/19  1504  BP: 104/62 (!) 105/54 110/60 (!) 122/52  Pulse: 61 63 (!) 0 65  Resp: 17 19 (!) 4 15  Temp:      TempSrc:      SpO2: 100% 98% (!) 0%   Weight:      Height:        Intake/Output Summary (Last 24 hours) at 11/28/2019 1713 Last data filed at 11/28/2019 1217 Gross per 24 hour  Intake 240 ml  Output 200 ml  Net 40 ml   Filed Weights   11/25/19 1639 11/26/19 0016 11/26/19 2113  Weight: 71.2 kg 76.4 kg 76.4 kg    Examination: General exam: Conversant, in no acute distress Respiratory system: normal chest rise, clear, no audible wheezing Cardiovascular system: regular rhythm, s1-s2 Gastrointestinal system: Nondistended, nontender, pos BS Central nervous system: No seizures, no tremors Extremities: No cyanosis, no joint deformities Skin: No rashes, no pallor Psychiatry: Affect normal // no auditory hallucinations   Data Reviewed: I have personally reviewed following labs and imaging studies  CBC: Recent Labs  Lab 11/25/19 1810 11/26/19 0304 11/27/19 0655 11/28/19 0655  WBC 10.9* 10.8* 8.1 8.5  NEUTROABS 8.3*  --   --   --   HGB 11.9* 12.1* 12.3* 12.3*  HCT 34.7* 36.3* 37.3* 37.2*  MCV 94.6 95.5 96.6 95.6  PLT 228 253 233 916   Basic Metabolic Panel: Recent Labs  Lab 11/25/19 1810 11/26/19 0304 11/27/19 0655 11/28/19 0655  NA 129* 132* 132* 130*  K 4.1 4.4 4.0 3.9  CL 96* 101 97* 98  CO2 22 21* 22 22  GLUCOSE 198* 125* 139* 131*  BUN 23 15 13 13   CREATININE 1.29* 1.13 1.14 1.11  CALCIUM 9.5 10.0 9.7 9.3   GFR: Estimated Creatinine Clearance: 64.6 mL/min (by C-G formula based on SCr of 1.11 mg/dL). Liver Function Tests: Recent Labs  Lab 11/27/19 0655 11/28/19 0655  AST 12* 16  ALT 14 17  ALKPHOS 75 83  BILITOT 0.4 0.7  PROT 7.3 7.3  ALBUMIN 3.7 3.7   No results for input(s): LIPASE, AMYLASE in the last 168 hours. No results for input(s): AMMONIA in the last 168 hours. Coagulation Profile: No results for input(s): INR, PROTIME in the last 168  hours. Cardiac Enzymes: No results for input(s): CKTOTAL, CKMB, CKMBINDEX, TROPONINI in the last 168 hours. BNP (last 3 results) No results for input(s): PROBNP in the last 8760 hours. HbA1C: Recent Labs    11/26/19 0304  HGBA1C 7.3*   CBG: Recent Labs  Lab 11/27/19 1647 11/27/19 2038 11/28/19 0640 11/28/19 1139 11/28/19 1521  GLUCAP 157* 216* 123* 128* 135*   Lipid Profile: No results for input(s): CHOL, HDL, LDLCALC, TRIG, CHOLHDL, LDLDIRECT in the last 72 hours. Thyroid Function Tests: No results for input(s): TSH, T4TOTAL, FREET4, T3FREE, THYROIDAB in the last 72 hours. Anemia Panel: No results for input(s): VITAMINB12, FOLATE, FERRITIN, TIBC, IRON, RETICCTPCT in the last 72 hours. Sepsis Labs: Recent Labs  Lab 11/25/19 1810 11/26/19 0304 11/26/19 0704  LATICACIDVEN 2.1* 1.4 1.1    Recent Results (from the past 240 hour(s))  SARS Coronavirus 2 by RT PCR (hospital order, performed in [facility] hospital lab) Nasopharyngeal Nasopharyngeal Swab     Status: None   Collection Time: 11/25/19  7:50 PM   Specimen: Nasopharyngeal Swab  Result Value Ref Range Status   SARS Coronavirus 2 NEGATIVE NEGATIVE Final    Comment: (NOTE) SARS-CoV-2 target nucleic acids are NOT DETECTED. The SARS-CoV-2 RNA is generally detectable in upper and lower respiratory specimens during the acute phase of infection. The lowest concentration of SARS-CoV-2 viral copies this assay can detect is 250 copies / mL. A negative result does not preclude SARS-CoV-2 infection and should not be used as the sole basis for treatment or other patient management decisions.  A negative result may occur with improper specimen collection / handling, submission of specimen other than nasopharyngeal swab, presence of viral mutation(s) within the areas targeted by this assay, and inadequate number of viral copies (<250 copies / mL). A negative result must be combined with clinical observations, patient  history, and epidemiological information. Fact Sheet for Patients:   StrictlyIdeas.no Fact Sheet for Healthcare Providers: BankingDealers.co.za This test is not yet approved or cleared  by the [place] FDA and has been authorized for detection and/or diagnosis of SARS-CoV-2 by FDA under an Emergency Use Authorization (EUA).  This EUA will remain in effect (meaning this test can be used) for the duration of the COVID-19 declaration under Section 564(b)(1) of the Act, 21 U.S.C. section 360bbb-3(b)(1), unless the authorization is terminated or revoked sooner. Performed at [facility], [address], [place], [place] [address]      Radiology Studies: PERIPHERAL VASCULAR CATHETERIZATION  Result Date: 11/28/2019 Patient name: Jason [name] MRN: [id number] DOB: [birth year] Sex: male 11/28/2019 Pre-operative Diagnosis: Critical bilateral lower extremity ischemia with wounds Post-operative diagnosis:  Same Surgeon:  Erlene Quan C. Donzetta Matters, MD Procedure Performed: 1.  Ultrasound-guided cannulation left common femoral artery 2.  Aortogram with bilateral lower extremity runoff 3.  Chocolate balloon angioplasty of right common femoral artery with 6 x 40 mm balloon 4.  Stent of the left left external iliac artery with 6 x 40 mm Innova Indications: 69 year old male with history of right first and second toe amputation now has recurrent wounds there.  Has a history of a right femoral to popliteal artery bypass after previous revascularization of his right common femoral artery after cardiac catheterization.  On the left side he has had monophasic waveforms decreased ABIs but has not had pain.  He now has ulceration of the left foot including the left lateral foot left great toe and heel.  These are minor he is now indicated for angiography with possible invention likely bypass surgery. Findings: We difficulty accessing the left common femoral artery given  its diminutive size.  There was a dissection we ultimately stented left external iliac artery with 6 x 40 Innova.  The aorta iliac segments do not have any flow-limiting stenosis but are heavily diseased.  On the right side the common femoral artery had 80% stenosis which was reduced to 0% stenosis after chocolate balloon angioplasty the bypass is patent and he has runoff via the anterior tibial and peroneal arteries.  On the left side after stenting we have good inflow to the left common femoral artery.  The SFA is quite diminutive in frankly occludes appears in the mid thigh reconstitutes and above-knee  popliteal artery which occludes at the level of the anterior tibial artery and he reconstitutes the peroneal artery in the mid calf. Patient will be scheduled for left common femoral to peroneal artery bypass.  Procedure:  The patient was identified in the holding area and taken to room 8.  The patient was then placed supine on the table and prepped and draped in the usual sterile fashion.  A time out was called.  Ultrasound was used to evaluate the left common femoral artery.  I could actually not pass micropuncture wire very far.  We did save an image department record.  I placed a micropuncture sheath used a Bentson wire was able to get this in part of the placed a 5 French sheath halfway.  I then performed retrograde angiography this demonstrated dissection.  I used a bare catheter and Glidewire advantage was able to steer into a true luminal plane and put an Omni catheter level of T12 performed angiography.  There was demonstratable dissection of the left external iliac artery as well as tight stenosis of the right common femoral artery.  I then crossed the bifurcation with Omni catheter and Glidewire advantage.  Patient was fully heparinized I placed a long 6 French sheath into the right external iliac artery.  I then crossed the lesion with a V 18 wire into the right femoropopliteal bypass performed  angiography of the right lower extremity where I could visualize the tibial arteries to be patent as well as the bypass throughout his course.  I then performed topical balloon angioplasty with a 6 mm balloon at nominal pressure for 3 minutes.  Completion demonstrated no residual stenosis or dissection.  Satisfied I retracted my sheath to the common femoral artery on the left just above the cannulation site.  Perform retrograde angiography demonstrated our dissection.  This was then primarily stented over the V 18 wire with a 6 x 40 mm Innova.  This was dilated with 6 x 40 mm chocolate balloon.  Completion demonstrated no residual stenosis or dissection.  We then perform left lower extremity angiography we demonstrated peroneal artery is the dominant runoff in the mid calf.  Satisfied with this we removed our wire.  Sheath will be pulled in postoperative holding.  Patient will be scheduled for left common femoral to peroneal artery bypass tomorrow in the operating room. Contrast: 165cc Brandon C. Donzetta Matters, MD [facility] Office: [phone] Pager: [phone]  VAS [place] ABI WITH/WO TBI  Result Date: 11/27/2019 LOWER EXTREMITY DOPPLER STUDY Indications: Rest pain, and non healing left great toe wound. High Risk Factors: Hypertension, hyperlipidemia, Diabetes, current smoker.  Vascular Interventions: Right femoropopliteal bypass graft and right great toe                         amputation -06/02/19. Performing Technologist: Oda Cogan Chief Tech  Examination Guidelines: A complete evaluation includes at minimum, Doppler waveform signals and systolic blood pressure reading at the level of bilateral brachial, anterior tibial, and posterior tibial arteries, when vessel segments are accessible. Bilateral testing is considered an integral part of a complete examination. Photoelectric Plethysmograph (PPG) waveforms and toe systolic pressure readings are included as required and additional  duplex testing as needed. Limited examinations for reoccurring indications may be performed as noted.  ABI Findings: +---------+------------------+-----+----------+--------+ Right    Rt Pressure (mmHg)IndexWaveform  Comment  +---------+------------------+-----+----------+--------+ Brachial 137  triphasic          +---------+------------------+-----+----------+--------+ ATA      81                0.58 monophasic         +---------+------------------+-----+----------+--------+ PTA      89                0.64                    +---------+------------------+-----+----------+--------+ PERO                            monophasic         +---------+------------------+-----+----------+--------+ Great Toe                       Absent             +---------+------------------+-----+----------+--------+ +--------+------------------+-----+----------+-------+ Left    Lt Pressure (mmHg)IndexWaveform  Comment +--------+------------------+-----+----------+-------+ [id number]                    triphasic         +--------+------------------+-----+----------+-------+ ATA     0                 0.00 absent            +--------+------------------+-----+----------+-------+ PTA     0                 0.00 absent            +--------+------------------+-----+----------+-------+ PERO    41                0.29 monophasic        +--------+------------------+-----+----------+-------+ +-------+-----------+-----------+------------+------------+ ABI/TBIToday's ABIToday's TBIPrevious ABIPrevious TBI +-------+-----------+-----------+------------+------------+ Right  0.64       N/A        0.85        N/A          +-------+-----------+-----------+------------+------------+ Left   0.29       N/A        0.24        N/A          +-------+-----------+-----------+------------+------------+ Right ABIs appear decreased compared to prior study on 07/29/19.  Left ABIs appear essentially unchanged compared to prior study on 07/29/19.  Summary: Right: Resting right ankle-brachial index indicates moderate right lower extremity arterial disease. Left: Resting left ankle-brachial index indicates critical left limb ischemia.  *See table(s) above for measurements and observations.  Electronically signed by Ruta Hinds MD on 11/27/2019 at 7:12:01 PM.    Final    VAS [place] LOWER EXTREMITY ARTERIAL DUPLEX  Result Date: 11/27/2019 LOWER EXTREMITY ARTERIAL DUPLEX STUDY Indications: Rest pain, and ulceration. High Risk Factors: Hypertension, hyperlipidemia, Diabetes, current smoker,                    coronary artery disease.  Vascular Interventions: Right femoropopliteal bypass graft and right great toe                         amputation 06/02/19. Current ABI:            Right 0.26, left 0.29 Performing Technologist: Oda Cogan Chief Tech  Examination Guidelines: A complete evaluation includes B-mode imaging, spectral Doppler, color Doppler, and power Doppler as needed of all accessible portions of each vessel. Bilateral testing is considered an integral  part of a complete examination. Limited examinations for reoccurring indications may be performed as noted.  +-----------+--------+-----+--------+----------+--------+ RIGHT      PSV cm/sRatioStenosisWaveform  Comments +-----------+--------+-----+--------+----------+--------+ ATA Distal 62                   monophasic         +-----------+--------+-----+--------+----------+--------+ PTA Prox                occluded                   +-----------+--------+-----+--------+----------+--------+ PTA Mid                 occluded                   +-----------+--------+-----+--------+----------+--------+ PTA Distal 12                                      +-----------+--------+-----+--------+----------+--------+ PERO Distal62                   monophasic          +-----------+--------+-----+--------+----------+--------+  Right Graft #1: +------------------+--------+--------+----------+--------+                   PSV cm/sStenosisWaveform  Comments +------------------+--------+--------+----------+--------+ Inflow            86              monophasic         +------------------+--------+--------+----------+--------+ Prox Anastomosis  117             monophasic         +------------------+--------+--------+----------+--------+ Proximal Graft    52              monophasic         +------------------+--------+--------+----------+--------+ Mid Graft         45              monophasic         +------------------+--------+--------+----------+--------+ Distal Graft      55              monophasic         +------------------+--------+--------+----------+--------+ Distal Anastomosis56              monophasic         +------------------+--------+--------+----------+--------+ Outflow           141             monophasic         +------------------+--------+--------+----------+--------+ Patent graft with monophasic flow throughout.  +-----------+--------+-----+--------+----------+-----------------+ LEFT       PSV cm/sRatioStenosisWaveform  Comments          +-----------+--------+-----+--------+----------+-----------------+ CFA Distal 118                  monophasic                  +-----------+--------+-----+--------+----------+-----------------+ DFA        153                  monophasic                  +-----------+--------+-----+--------+----------+-----------------+ SFA Prox   65                   monophasic                  +-----------+--------+-----+--------+----------+-----------------+  SFA Mid    62                   monophasic                  +-----------+--------+-----+--------+----------+-----------------+ SFA Distal 51                   monophasic                   +-----------+--------+-----+--------+----------+-----------------+ POP Prox                        monophasic                  +-----------+--------+-----+--------+----------+-----------------+ POP Distal 57                   monophasic                  +-----------+--------+-----+--------+----------+-----------------+ ATA Prox                occluded                            +-----------+--------+-----+--------+----------+-----------------+ ATA Mid                 occluded                            +-----------+--------+-----+--------+----------+-----------------+ ATA Distal              occluded                            +-----------+--------+-----+--------+----------+-----------------+ PTA Prox   26                   monophasic                  +-----------+--------+-----+--------+----------+-----------------+ PTA Mid    0            occluded                            +-----------+--------+-----+--------+----------+-----------------+ PTA Distal 0            occluded                            +-----------+--------+-----+--------+----------+-----------------+ PERO Mid   22                   monophasicseverely dampened +-----------+--------+-----+--------+----------+-----------------+ PERO Distal20                   monophasicseverly dampened  +-----------+--------+-----+--------+----------+-----------------+  Summary: Right: Right 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 Left: Left common femoral artery, superficial femoral artery, profunda femoral artery and popliteal artery are patent with monophasic flow without significant stenosis. The posterior tibial and anterior tibial arteries appears occluded. Peroneal artery appears patent with severly dampened flow.  See table(s) above for measurements and observations. Electronically signed by Ruta Hinds MD on 11/27/2019 at 7:11:53 PM.    Final      Scheduled Meds: . [MAR Hold] aspirin EC  81 mg Oral Daily  . [MAR Hold] clopidogrel  75 mg Oral Daily  . [MAR Hold] enoxaparin (LOVENOX) injection  40 mg  Subcutaneous Q24H  . [MAR Hold] gabapentin  300 mg Oral BID  . [MAR Hold] insulin aspart  0-9 Units Subcutaneous TID WC  . [MAR Hold] levothyroxine  50 mcg Oral Daily  . [MAR Hold] metoprolol tartrate  12.5 mg Oral BID  . [MAR Hold] nicotine  21 mg Transdermal Daily  . [MAR Hold] ranolazine  1,000 mg Oral BID  . [MAR Hold] rosuvastatin  20 mg Oral Daily  . [MAR Hold] sertraline  100 mg Oral Daily   Continuous Infusions: . sodium chloride 100 mL/hr at 11/28/19 1231  . sodium chloride 100 mL/hr at 11/28/19 1517     LOS: 3 days   Marylu Lund, MD [facility] Pager On Amion  If 7PM-7AM, please contact night-coverage 11/28/2019, 5:13 PM

## 2019-11-28 NOTE — Progress Notes (Signed)
Site area: right groin  Site Prior to Removal:  Level 0  Pressure Applied For 20 MINUTES    Minutes Beginning at 20   Manual:   Yes.    Patient Status During Pull:  Stable   Post Pull Groin Site:  Level 0  Post Pull Instructions Given:  Yes.    Post Pull Pulses Present:  Yes.    Dressing Applied:  Yes.    Comments:  Bed rest started at 1700 X 4 hr.

## 2019-11-28 NOTE — Consult Note (Signed)
WOC Nurse Consult Note: Patient receiving care in [facility] [phone].  Consult completed remotely after review of record. Reason for Consult: R foot plantar ulcer Wound type: ischemic Pressure Injury POA: Yes/No/NA Measurement: Please see note from Dr. Ruta Hinds from 11/26/19 at 0750. Wound bed: Drainage (amount, consistency, odor)  Periwound: Dressing procedure/placement/frequency:  "Wound care would recommend dry dressing to protect both feet and wash daily once with warm water and soap" Order for this topical therapy have been entered per recommendations from Dr. Pershing Proud.  I understand from his note a vascular intervention may occur later this week. Monitor the wound area(s) for worsening of condition such as: Signs/symptoms of infection,  Increase in size,  Development of or worsening of odor, Development of pain, or increased pain at the affected locations.  Notify the medical team if any of these develop.  Thank you for the consult. [facility] nurse will not follow at this time.  Please re-consult the [facility] team if needed.  Val Riles, RN, MSN, CWOCN, CNS-BC, pager [phone]

## 2019-11-28 NOTE — Op Note (Signed)
Patient name: Jason [name] MRN: [id number] DOB: 06-Jun-1951 Sex: male  11/28/2019 Pre-operative Diagnosis: Critical bilateral lower extremity ischemia with wounds Post-operative diagnosis:  Same Surgeon:  Erlene Quan C. Donzetta Matters, MD Procedure Performed: 1.  Ultrasound-guided cannulation left common femoral artery 2.  Aortogram with bilateral lower extremity runoff 3.  Chocolate balloon angioplasty of right common femoral artery with 6 x 40 mm balloon 4.  Stent of the left left external iliac artery with 6 x 40 mm Innova  Indications: 69 year old male with history of right first and second toe amputation now has recurrent wounds there.  Has a history of a right femoral to popliteal artery bypass after previous revascularization of his right common femoral artery after cardiac catheterization.  On the left side he has had monophasic waveforms decreased ABIs but has not had pain.  He now has ulceration of the left foot including the left lateral foot left great toe and heel.  These are minor he is now indicated for angiography with possible invention likely bypass surgery.  Findings: We difficulty accessing the left common femoral artery given its diminutive size.  There was a dissection we ultimately stented left external iliac artery with 6 x 40 Innova.  The aorta iliac segments do not have any flow-limiting stenosis but are heavily diseased.  On the right side the common femoral artery had 80% stenosis which was reduced to 0% stenosis after chocolate balloon angioplasty the bypass is patent and he has runoff via the anterior tibial and peroneal arteries.  On the left side after stenting we have good inflow to the left common femoral artery.  The SFA is quite diminutive in frankly occludes appears in the mid thigh reconstitutes and above-knee popliteal artery which occludes at the level of the anterior tibial artery and he reconstitutes the peroneal artery in the mid calf.  Patient will be scheduled for  left common femoral to peroneal artery bypass.   Procedure:  The patient was identified in the holding area and taken to room 8.  The patient was then placed supine on the table and prepped and draped in the usual sterile fashion.  A time out was called.  Ultrasound was used to evaluate the left common femoral artery.  I could actually not pass micropuncture wire very far.  We did save an image department record.  I placed a micropuncture sheath used a Bentson wire was able to get this in part of the placed a 5 French sheath halfway.  I then performed retrograde angiography this demonstrated dissection.  I used a bare catheter and Glidewire advantage was able to steer into a true luminal plane and put an Omni catheter level of T12 performed angiography.  There was demonstratable dissection of the left external iliac artery as well as tight stenosis of the right common femoral artery.  I then crossed the bifurcation with Omni catheter and Glidewire advantage.  Patient was fully heparinized I placed a long 6 French sheath into the right external iliac artery.  I then crossed the lesion with a V 18 wire into the right femoropopliteal bypass performed angiography of the right lower extremity where I could visualize the tibial arteries to be patent as well as the bypass throughout his course.  I then performed topical balloon angioplasty with a 6 mm balloon at nominal pressure for 3 minutes.  Completion demonstrated no residual stenosis or dissection.  Satisfied I retracted my sheath to the common femoral artery on the left just above the  cannulation site.  Perform retrograde angiography demonstrated our dissection.  This was then primarily stented over the V 18 wire with a 6 x 40 mm Innova.  This was dilated with 6 x 40 mm chocolate balloon.  Completion demonstrated no residual stenosis or dissection.  We then perform left lower extremity angiography we demonstrated peroneal artery is the dominant runoff in the mid  calf.  Satisfied with this we removed our wire.  Sheath will be pulled in postoperative holding.  Patient will be scheduled for left common femoral to peroneal artery bypass tomorrow in the operating room.  Contrast: 165cc  Amair Shrout C. Donzetta Matters, MD [facility] Office: [phone] Pager: [phone]

## 2019-11-29 ENCOUNTER — Encounter (HOSPITAL_COMMUNITY)
Admission: EM | Disposition: A | Discharge status: Skilled Nursing Facility | Payer: Self-pay | Source: Home / Self Care | Attending: Internal Medicine

## 2019-11-29 ENCOUNTER — Inpatient Hospital Stay (HOSPITAL_COMMUNITY): Payer: Medicare Other | Admitting: Anesthesiology

## 2019-11-29 HISTORY — PX: BYPASS GRAFT FEMORAL-PERONEAL: SHX5762

## 2019-11-29 LAB — COMPREHENSIVE METABOLIC PANEL
ALT: 18 U/L (ref 0–44)
ALT: 17 U/L (ref 0–44)
AST: 17 U/L (ref 15–41)
AST: 16 U/L (ref 15–41)
Albumin: 3.3 g/dL — ABNORMAL LOW (ref 3.5–5.0)
Albumin: 3.2 g/dL — ABNORMAL LOW (ref 3.5–5.0)
Alkaline Phosphatase: 80 U/L (ref 38–126)
Alkaline Phosphatase: 74 U/L (ref 38–126)
Anion gap: 9 (ref 5–15)
Anion gap: 9 (ref 5–15)
BUN: 13 mg/dL (ref 8–23)
BUN: 15 mg/dL (ref 8–23)
CO2: 22 mmol/L (ref 22–32)
CO2: 22 mmol/L (ref 22–32)
Calcium: 9.1 mg/dL (ref 8.9–10.3)
Calcium: 9.1 mg/dL (ref 8.9–10.3)
Chloride: 100 mmol/L (ref 98–111)
Chloride: 100 mmol/L (ref 98–111)
Creatinine, Ser: 1.33 mg/dL — ABNORMAL HIGH (ref 0.61–1.24)
Creatinine, Ser: 1.24 mg/dL (ref 0.61–1.24)
GFR calc Af Amer: >60 mL/min (ref >60)
GFR calc Af Amer: >60 mL/min (ref >60)
GFR calc non Af Amer: 55 mL/min — ABNORMAL LOW (ref >60)
GFR calc non Af Amer: 60 mL/min — ABNORMAL LOW (ref >60)
Glucose, Bld: 247 mg/dL — ABNORMAL HIGH (ref 70–99)
Glucose, Bld: 163 mg/dL — ABNORMAL HIGH (ref 70–99)
Potassium: 4.5 mmol/L (ref 3.5–5.1)
Potassium: 4.3 mmol/L (ref 3.5–5.1)
Sodium: 131 mmol/L — ABNORMAL LOW (ref 135–145)
Sodium: 131 mmol/L — ABNORMAL LOW (ref 135–145)
Total Bilirubin: 0.3 mg/dL (ref 0.3–1.2)
Total Bilirubin: 0.6 mg/dL (ref 0.3–1.2)
Total Protein: 6.6 g/dL (ref 6.5–8.1)
Total Protein: 6.4 g/dL — ABNORMAL LOW (ref 6.5–8.1)

## 2019-11-29 LAB — CBC
HCT: 32.5 % — ABNORMAL LOW (ref 39.0–52.0)
HCT: 35.3 % — ABNORMAL LOW (ref 39.0–52.0)
HCT: 36 % — ABNORMAL LOW (ref 39.0–52.0)
Hemoglobin: 10.7 g/dL — ABNORMAL LOW (ref 13.0–17.0)
Hemoglobin: 11.7 g/dL — ABNORMAL LOW (ref 13.0–17.0)
Hemoglobin: 11.9 g/dL — ABNORMAL LOW (ref 13.0–17.0)
MCH: 31.7 pg (ref 26.0–34.0)
MCH: 31.8 pg (ref 26.0–34.0)
MCH: 31.8 pg (ref 26.0–34.0)
MCHC: 32.9 g/dL (ref 30.0–36.0)
MCHC: 33.1 g/dL (ref 30.0–36.0)
MCHC: 33.1 g/dL (ref 30.0–36.0)
MCV: 95.9 fL (ref 80.0–100.0)
MCV: 96 fL (ref 80.0–100.0)
MCV: 96.7 fL (ref 80.0–100.0)
Platelets: 236 10*3/uL (ref 150–400)
Platelets: 247 10*3/uL (ref 150–400)
Platelets: 254 10*3/uL (ref 150–400)
RBC: 3.36 MIL/uL — ABNORMAL LOW (ref 4.22–5.81)
RBC: 3.68 MIL/uL — ABNORMAL LOW (ref 4.22–5.81)
RBC: 3.75 MIL/uL — ABNORMAL LOW (ref 4.22–5.81)
RDW: 13.3 % (ref 11.5–15.5)
RDW: 13.4 % (ref 11.5–15.5)
RDW: 13.5 % (ref 11.5–15.5)
WBC: 12.7 10*3/uL — ABNORMAL HIGH (ref 4.0–10.5)
WBC: 7.5 10*3/uL (ref 4.0–10.5)
WBC: 7.9 10*3/uL (ref 4.0–10.5)
nRBC: 0 % (ref 0.0–0.2)
nRBC: 0 % (ref 0.0–0.2)
nRBC: 0 % (ref 0.0–0.2)

## 2019-11-29 LAB — GLUCOSE, CAPILLARY
Glucose-Capillary: 117 mg/dL — ABNORMAL HIGH (ref 70–99)
Glucose-Capillary: 136 mg/dL — ABNORMAL HIGH (ref 70–99)
Glucose-Capillary: 185 mg/dL — ABNORMAL HIGH (ref 70–99)
Glucose-Capillary: 226 mg/dL — ABNORMAL HIGH (ref 70–99)
Glucose-Capillary: 349 mg/dL — ABNORMAL HIGH (ref 70–99)

## 2019-11-29 LAB — TYPE AND SCREEN
ABO/RH(D): O POS
Antibody Screen: NEGATIVE

## 2019-11-29 LAB — PROTIME-INR
INR: 1.1 (ref 0.8–1.2)
Prothrombin Time: 13.5 seconds (ref 11.4–15.2)

## 2019-11-29 LAB — SURGICAL PCR SCREEN
MRSA, PCR: NEGATIVE
Staphylococcus aureus: POSITIVE — AB

## 2019-11-29 LAB — CREATININE, SERUM
Creatinine, Ser: 1.21 mg/dL (ref 0.61–1.24)
GFR calc Af Amer: >60 mL/min (ref >60)
GFR calc non Af Amer: >60 mL/min (ref >60)

## 2019-11-29 LAB — APTT: aPTT: 37 seconds — ABNORMAL HIGH (ref 24–36)

## 2019-11-29 LAB — POCT ACTIVATED CLOTTING TIME: Activated Clotting Time: 230 seconds

## 2019-11-29 SURGERY — CREATION, BYPASS, ARTERIAL, FEMORAL TO PERONEAL, USING GRAFT
Anesthesia: General | Site: Leg Upper | Laterality: Left

## 2019-11-29 MED ORDER — MIDAZOLAM HCL 2 MG/2ML IJ SOLN
INTRAMUSCULAR | Status: AC
  Filled 2019-11-29: qty 2

## 2019-11-29 MED ORDER — HEPARIN SODIUM (PORCINE) 1000 UNIT/ML IJ SOLN
INTRAMUSCULAR | Status: DC | PRN
  Administered 2019-11-29: 8000 [IU] via INTRAVENOUS

## 2019-11-29 MED ORDER — CEFAZOLIN SODIUM-DEXTROSE 2-4 GM/100ML-% IV SOLN
2.0000 g | Freq: Three times a day (TID) | INTRAVENOUS | Status: AC
  Administered 2019-11-29 – 2019-11-30 (×2): 2 g via INTRAVENOUS
  Filled 2019-11-29 (×2): qty 100

## 2019-11-29 MED ORDER — EPHEDRINE 5 MG/ML INJ
INTRAVENOUS | Status: AC
  Filled 2019-11-29: qty 10

## 2019-11-29 MED ORDER — SUGAMMADEX SODIUM 200 MG/2ML IV SOLN
INTRAVENOUS | Status: DC | PRN
  Administered 2019-11-29: 145.4 mg via INTRAVENOUS

## 2019-11-29 MED ORDER — FENTANYL CITRATE (PF) 100 MCG/2ML IJ SOLN
25.0000 ug | INTRAMUSCULAR | Status: DC | PRN
  Administered 2019-11-29 (×2): 50 ug via INTRAVENOUS

## 2019-11-29 MED ORDER — MORPHINE SULFATE (PF) 2 MG/ML IV SOLN: 2.0000 mg | INTRAVENOUS | Status: DC | PRN

## 2019-11-29 MED ORDER — ROCURONIUM BROMIDE 10 MG/ML (PF) SYRINGE
PREFILLED_SYRINGE | INTRAVENOUS | Status: DC | PRN
  Administered 2019-11-29: 10 mg via INTRAVENOUS
  Administered 2019-11-29: 60 mg via INTRAVENOUS
  Administered 2019-11-29: 10 mg via INTRAVENOUS

## 2019-11-29 MED ORDER — SODIUM CHLORIDE 0.9 % IV SOLN: 500.0000 mL | Freq: Once | INTRAVENOUS | Status: DC | PRN

## 2019-11-29 MED ORDER — ONDANSETRON HCL 4 MG/2ML IJ SOLN: 4.0000 mg | Freq: Four times a day (QID) | INTRAMUSCULAR | Status: DC | PRN

## 2019-11-29 MED ORDER — MIDAZOLAM HCL 5 MG/5ML IJ SOLN
INTRAMUSCULAR | Status: DC | PRN
  Administered 2019-11-29: 1 mg via INTRAVENOUS

## 2019-11-29 MED ORDER — FENTANYL CITRATE (PF) 100 MCG/2ML IJ SOLN
INTRAMUSCULAR | Status: DC | PRN
  Administered 2019-11-29 (×3): 50 ug via INTRAVENOUS
  Administered 2019-11-29: 75 ug via INTRAVENOUS
  Administered 2019-11-29: 25 ug via INTRAVENOUS

## 2019-11-29 MED ORDER — HEPARIN SODIUM (PORCINE) 1000 UNIT/ML IJ SOLN
INTRAMUSCULAR | Status: AC
  Filled 2019-11-29: qty 1

## 2019-11-29 MED ORDER — ORAL CARE MOUTH RINSE: 15.0000 mL | Freq: Once | OROMUCOSAL | Status: AC

## 2019-11-29 MED ORDER — POTASSIUM CHLORIDE CRYS ER 20 MEQ PO TBCR: 20.0000 meq | EXTENDED_RELEASE_TABLET | Freq: Every day | ORAL | Status: DC | PRN

## 2019-11-29 MED ORDER — PHENYLEPHRINE HCL-NACL 10-0.9 MG/250ML-% IV SOLN
INTRAVENOUS | Status: DC | PRN
  Administered 2019-11-29: 25 ug/min via INTRAVENOUS
  Administered 2019-11-29: 75 ug/min via INTRAVENOUS

## 2019-11-29 MED ORDER — CHLORHEXIDINE GLUCONATE 0.12 % MT SOLN
OROMUCOSAL | Status: AC
  Administered 2019-11-29: 15 mL via OROMUCOSAL
  Filled 2019-11-29: qty 15

## 2019-11-29 MED ORDER — FENTANYL CITRATE (PF) 250 MCG/5ML IJ SOLN
INTRAMUSCULAR | Status: AC
  Filled 2019-11-29: qty 5

## 2019-11-29 MED ORDER — PHENYLEPHRINE HCL (PRESSORS) 10 MG/ML IV SOLN
INTRAVENOUS | Status: DC | PRN
Start: 2019-11-29 — End: 2019-11-29
  Administered 2019-11-29: 120 ug via INTRAVENOUS
  Administered 2019-11-29: 40 ug via INTRAVENOUS
  Administered 2019-11-29: 120 ug via INTRAVENOUS
  Administered 2019-11-29: 80 ug via INTRAVENOUS

## 2019-11-29 MED ORDER — ALUM & MAG HYDROXIDE-SIMETH 200-200-20 MG/5ML PO SUSP: 15.0000 mL | ORAL | Status: DC | PRN

## 2019-11-29 MED ORDER — PROTAMINE SULFATE 10 MG/ML IV SOLN
INTRAVENOUS | Status: DC | PRN
  Administered 2019-11-29: 50 mg via INTRAVENOUS

## 2019-11-29 MED ORDER — DEXAMETHASONE SODIUM PHOSPHATE 10 MG/ML IJ SOLN
INTRAMUSCULAR | Status: DC | PRN
  Administered 2019-11-29: 4 mg via INTRAVENOUS

## 2019-11-29 MED ORDER — PROPOFOL 10 MG/ML IV BOLUS
INTRAVENOUS | Status: DC | PRN
  Administered 2019-11-29: 100 mg via INTRAVENOUS

## 2019-11-29 MED ORDER — HEMOSTATIC AGENTS (NO CHARGE) OPTIME
TOPICAL | Status: DC | PRN
  Administered 2019-11-29: 1 via TOPICAL

## 2019-11-29 MED ORDER — 0.9 % SODIUM CHLORIDE (POUR BTL) OPTIME
TOPICAL | Status: DC | PRN
  Administered 2019-11-29: 1000 mL

## 2019-11-29 MED ORDER — GUAIFENESIN-DM 100-10 MG/5ML PO SYRP: 15.0000 mL | ORAL_SOLUTION | ORAL | Status: DC | PRN

## 2019-11-29 MED ORDER — ONDANSETRON HCL 4 MG/2ML IJ SOLN: 4.0000 mg | Freq: Once | INTRAMUSCULAR | Status: DC | PRN

## 2019-11-29 MED ORDER — SODIUM CHLORIDE 0.9 % IV SOLN
INTRAVENOUS | Status: DC | PRN
  Administered 2019-11-29: 500 mL

## 2019-11-29 MED ORDER — PHENOL 1.4 % MT LIQD: 1.0000 | OROMUCOSAL | Status: DC | PRN

## 2019-11-29 MED ORDER — FENTANYL CITRATE (PF) 100 MCG/2ML IJ SOLN
INTRAMUSCULAR | Status: AC
  Filled 2019-11-29: qty 2

## 2019-11-29 MED ORDER — LACTATED RINGERS IV SOLN: INTRAVENOUS | Status: DC | PRN

## 2019-11-29 MED ORDER — ACETAMINOPHEN 500 MG PO TABS
1000.0000 mg | ORAL_TABLET | Freq: Once | ORAL | Status: AC
  Administered 2019-11-29: 1000 mg via ORAL

## 2019-11-29 MED ORDER — CHLORHEXIDINE GLUCONATE CLOTH 2 % EX PADS
6.0000 | MEDICATED_PAD | Freq: Every day | CUTANEOUS | Status: AC
  Administered 2019-12-03 – 2019-12-04 (×2): 6 via TOPICAL

## 2019-11-29 MED ORDER — PAPAVERINE HCL 30 MG/ML IJ SOLN
INTRAMUSCULAR | Status: AC
  Filled 2019-11-29: qty 2

## 2019-11-29 MED ORDER — MAGNESIUM SULFATE 2 GM/50ML IV SOLN: 2.0000 g | Freq: Every day | INTRAVENOUS | Status: DC | PRN

## 2019-11-29 MED ORDER — MUPIROCIN 2 % EX OINT
1.0000 "application " | TOPICAL_OINTMENT | Freq: Two times a day (BID) | CUTANEOUS | Status: AC
  Administered 2019-11-29 – 2019-12-03 (×10): 1 via NASAL
  Filled 2019-11-29 (×4): qty 22

## 2019-11-29 MED ORDER — PROTAMINE SULFATE 10 MG/ML IV SOLN
INTRAVENOUS | Status: AC
  Filled 2019-11-29: qty 25

## 2019-11-29 MED ORDER — HYDRALAZINE HCL 20 MG/ML IJ SOLN
5.0000 mg | INTRAMUSCULAR | Status: DC | PRN
  Filled 2019-11-29: qty 0.25

## 2019-11-29 MED ORDER — SODIUM CHLORIDE 0.9 % IV SOLN
INTRAVENOUS | Status: AC
  Filled 2019-11-29: qty 1.2

## 2019-11-29 MED ORDER — ACETAMINOPHEN 500 MG PO TABS
ORAL_TABLET | ORAL | Status: AC
  Filled 2019-11-29: qty 2

## 2019-11-29 MED ORDER — ONDANSETRON HCL 4 MG/2ML IJ SOLN
INTRAMUSCULAR | Status: DC | PRN
  Administered 2019-11-29: 4 mg via INTRAVENOUS

## 2019-11-29 MED ORDER — CHLORHEXIDINE GLUCONATE 0.12 % MT SOLN: 15.0000 mL | Freq: Once | OROMUCOSAL | Status: AC

## 2019-11-29 MED ORDER — PANTOPRAZOLE SODIUM 40 MG PO TBEC
40.0000 mg | DELAYED_RELEASE_TABLET | Freq: Every day | ORAL | Status: DC
  Administered 2019-11-30 – 2019-12-08 (×9): 40 mg via ORAL
  Filled 2019-11-29 (×9): qty 1

## 2019-11-29 MED ORDER — PROTAMINE SULFATE 10 MG/ML IV SOLN
INTRAVENOUS | Status: AC
  Filled 2019-11-29: qty 5

## 2019-11-29 MED ORDER — SODIUM CHLORIDE 0.9 % IV SOLN: INTRAVENOUS | Status: AC

## 2019-11-29 MED ORDER — LIDOCAINE 20MG/ML (2%) 15 ML SYRINGE OPTIME
INTRAMUSCULAR | Status: DC | PRN
  Administered 2019-11-29: 80 mg via INTRAVENOUS

## 2019-11-29 MED ORDER — HEPARIN SODIUM (PORCINE) 5000 UNIT/ML IJ SOLN
5000.0000 [IU] | Freq: Three times a day (TID) | INTRAMUSCULAR | Status: DC
  Administered 2019-11-30 – 2019-12-08 (×24): 5000 [IU] via SUBCUTANEOUS
  Filled 2019-11-29 (×25): qty 1

## 2019-11-29 SURGICAL SUPPLY — 63 items
ADH SKN CLS APL DERMABOND .7 (GAUZE/BANDAGES/DRESSINGS) ×2
BANDAGE ESMARK 6X9 LF (GAUZE/BANDAGES/DRESSINGS) IMPLANT
BNDG CMPR 9X6 STRL LF SNTH (GAUZE/BANDAGES/DRESSINGS)
BNDG ESMARK 6X9 LF (GAUZE/BANDAGES/DRESSINGS)
CANISTER SUCT 3000ML PPV (MISCELLANEOUS) ×3 IMPLANT
CANNULA VESSEL 3MM 2 BLNT TIP (CANNULA) ×2 IMPLANT
CLIP VESOCCLUDE MED 24/CT (CLIP) ×3 IMPLANT
CLIP VESOCCLUDE SM WIDE 24/CT (CLIP) ×3 IMPLANT
CLIP VESOCCLUDE SM WIDE 6/CT (CLIP) ×2 IMPLANT
COVER WAND RF STERILE (DRAPES) ×1 IMPLANT
CUFF TOURN SGL QUICK 24 (TOURNIQUET CUFF)
CUFF TOURN SGL QUICK 34 (TOURNIQUET CUFF)
CUFF TOURN SGL QUICK 42 (TOURNIQUET CUFF) IMPLANT
CUFF TRNQT CYL 24X4X16.5-23 (TOURNIQUET CUFF) IMPLANT
CUFF TRNQT CYL 34X4.125X (TOURNIQUET CUFF) IMPLANT
DERMABOND ADVANCED (GAUZE/BANDAGES/DRESSINGS) ×4
DERMABOND ADVANCED .7 DNX12 (GAUZE/BANDAGES/DRESSINGS) ×1 IMPLANT
DRAIN CHANNEL 15F RND FF W/TCR (WOUND CARE) IMPLANT
DRAPE C-ARM 42X72 X-RAY (DRAPES) IMPLANT
DRAPE HALF SHEET 40X57 (DRAPES) ×2 IMPLANT
DRAPE X-RAY CASS 24X20 (DRAPES) IMPLANT
DRSG MEPILEX BORDER 4X4 (GAUZE/BANDAGES/DRESSINGS) ×4 IMPLANT
ELECT REM PT RETURN 9FT ADLT (ELECTROSURGICAL) ×3
ELECTRODE REM PT RTRN 9FT ADLT (ELECTROSURGICAL) ×1 IMPLANT
EVACUATOR SILICONE 100CC (DRAIN) IMPLANT
GLOVE BIO SURGEON STRL SZ 6.5 (GLOVE) ×2 IMPLANT
GLOVE BIO SURGEON STRL SZ7.5 (GLOVE) ×5 IMPLANT
GLOVE BIO SURGEONS STRL SZ 6.5 (GLOVE) ×2
GLOVE BIOGEL PI IND STRL 6.5 (GLOVE) IMPLANT
GLOVE BIOGEL PI IND STRL 7.0 (GLOVE) IMPLANT
GLOVE BIOGEL PI INDICATOR 6.5 (GLOVE) ×8
GLOVE BIOGEL PI INDICATOR 7.0 (GLOVE) ×4
GOWN STRL REUS W/ TWL LRG LVL3 (GOWN DISPOSABLE) ×2 IMPLANT
GOWN STRL REUS W/ TWL XL LVL3 (GOWN DISPOSABLE) ×1 IMPLANT
GOWN STRL REUS W/TWL LRG LVL3 (GOWN DISPOSABLE) ×12
GOWN STRL REUS W/TWL XL LVL3 (GOWN DISPOSABLE) ×9
HEMOSTAT SNOW SURGICEL 2X4 (HEMOSTASIS) IMPLANT
INSERT FOGARTY SM (MISCELLANEOUS) IMPLANT
KIT BASIN OR (CUSTOM PROCEDURE TRAY) ×3 IMPLANT
KIT TURNOVER KIT B (KITS) ×3 IMPLANT
MARKER GRAFT CORONARY BYPASS (MISCELLANEOUS) IMPLANT
MARKER SKIN DUAL TIP RULER LAB (MISCELLANEOUS) ×2 IMPLANT
NS IRRIG 1000ML POUR BTL (IV SOLUTION) ×6 IMPLANT
PACK PERIPHERAL VASCULAR (CUSTOM PROCEDURE TRAY) ×3 IMPLANT
PAD ARMBOARD 7.5X6 YLW CONV (MISCELLANEOUS) ×4 IMPLANT
SET COLLECT BLD 21X3/4 12 (NEEDLE) IMPLANT
STOPCOCK 4 WAY LG BORE MALE ST (IV SETS) IMPLANT
SUT ETHILON 3 0 PS 1 (SUTURE) IMPLANT
SUT MNCRL AB 4-0 PS2 18 (SUTURE) ×12 IMPLANT
SUT PROLENE 5 0 C 1 24 (SUTURE) ×9 IMPLANT
SUT PROLENE 6 0 BV (SUTURE) ×7 IMPLANT
SUT PROLENE 7 0 BV 1 (SUTURE) IMPLANT
SUT SILK 2 0 SH (SUTURE) ×3 IMPLANT
SUT SILK 3 0 (SUTURE) ×9
SUT SILK 3-0 18XBRD TIE 12 (SUTURE) IMPLANT
SUT VIC AB 2-0 CT1 27 (SUTURE) ×6
SUT VIC AB 2-0 CT1 TAPERPNT 27 (SUTURE) ×2 IMPLANT
SUT VIC AB 3-0 SH 27 (SUTURE) ×15
SUT VIC AB 3-0 SH 27X BRD (SUTURE) ×2 IMPLANT
TOWEL GREEN STERILE (TOWEL DISPOSABLE) ×3 IMPLANT
TRAY FOLEY MTR SLVR 16FR STAT (SET/KITS/TRAYS/PACK) ×3 IMPLANT
UNDERPAD 30X36 HEAVY ABSORB (UNDERPADS AND DIAPERS) ×3 IMPLANT
WATER STERILE IRR 1000ML POUR (IV SOLUTION) ×3 IMPLANT

## 2019-11-29 NOTE — Progress Notes (Signed)
  Progress Note    11/29/2019 4:48 PM Day of Surgery  Subjective:  No complaints   Vitals:   11/29/19 1448 11/29/19 1502  BP: (!) 116/46 108/61  Pulse: 76 75  Resp: 15 18  Temp:  98.1 F (36.7 C)  SpO2: 94% 94%   Physical Exam: Lungs:  Non labored Incisions:  L groin and incisions of LLE c/d/i without hematoma Extremities:  Palpable L ATA Abdomen:  soft Neurologic: A&O  CBC    Component Value Date/Time   WBC 12.7 (H) 11/29/2019 1619   RBC 3.36 (L) 11/29/2019 1619   HGB 10.7 (L) 11/29/2019 1619   HCT 32.5 (L) 11/29/2019 1619   PLT 236 11/29/2019 1619   MCV 96.7 11/29/2019 1619   MCV 96.7 12/23/2016 1549   MCH 31.8 11/29/2019 1619   MCHC 32.9 11/29/2019 1619   RDW 13.3 11/29/2019 1619   LYMPHSABS 1.5 11/25/2019 1810   MONOABS 0.9 11/25/2019 1810   EOSABS 0.2 11/25/2019 1810   BASOSABS 0.0 11/25/2019 1810    BMET    Component Value Date/Time   NA 131 (L) 11/29/2019 0402   NA 131 (L) 09/30/2017 1629   K 4.3 11/29/2019 0402   CL 100 11/29/2019 0402   CO2 22 11/29/2019 0402   GLUCOSE 163 (H) 11/29/2019 0402   BUN 15 11/29/2019 0402   BUN 13 09/30/2017 1629   CREATININE 1.24 11/29/2019 0402   CREATININE 1.14 07/12/2015 1220   CALCIUM 9.1 11/29/2019 0402   GFRNONAA 60 (L) 11/29/2019 0402   GFRNONAA 69 07/12/2015 1220   GFRAA >60 11/29/2019 0402   GFRAA 79 07/12/2015 1220    INR    Component Value Date/Time   INR 1.1 11/28/2019 2352     Intake/Output Summary (Last 24 hours) at 11/29/2019 1648 Last data filed at 11/29/2019 1354 Gross per 24 hour  Intake 2641.7 ml  Output 2125 ml  Net 516.7 ml     Assessment/Plan:  69 y.o. male is s/p L CFA endarterectomy and femoral to peroneal bypass Day of Surgery   L foot well perfused with palpable L ATA pulse Pain medication on schedule overnight   Dagoberto Ligas, PA-C [facility] [phone] 11/29/2019 4:48 PM

## 2019-11-29 NOTE — Anesthesia Preprocedure Evaluation (Addendum)
Anesthesia Evaluation  Patient identified by MRN, date of birth, ID band Patient awake    Reviewed: Allergy & Precautions, NPO status , Patient's Chart, lab work & pertinent test results, reviewed documented beta blocker date and time   Airway Mallampati: II  TM Distance: >3 FB Neck ROM: Full    Dental  (+) Dental Advisory Given, Poor Dentition, Loose, Missing, Chipped,    Pulmonary COPD, Current Smoker and Patient abstained from smoking.,    Pulmonary exam normal breath sounds clear to auscultation       Cardiovascular hypertension, Pt. on home beta blockers + angina + CAD, + Past MI, + Cardiac Stents, + CABG, + Peripheral Vascular Disease (Critical Limb Ischemia; R ICA Stent) and +CHF  Normal cardiovascular exam+ dysrhythmias (RBBB)  Rhythm:Regular Rate:Normal  Echo 09/17/16: Study Conclusions   - Left ventricle: The cavity size was normal. There was mild focal basal hypertrophy of the septum. Systolic function was normal. The estimated ejection fraction was in the range of 60% to 65%. Wall motion was normal; there were no regional wall motion abnormalities. There was an increased relative contribution of atrial contraction to ventricular filling. Doppler parameters are consistent with abnormal left ventricular relaxation (grade 1 diastolic dysfunction).  - Aortic valve: Severe focal calcification involving the left  coronary and noncoronary cusp. Noncoronary cusp mobility was severely restricted.  - Mitral valve: Calcified annulus.  - Atrial septum: There was increased thickness of the septum, consistent with lipomatous hypertrophy.  - Pulmonary arteries: Systolic pressure could not be accurately estimated.    Neuro/Psych PSYCHIATRIC DISORDERS Anxiety Depression  Neuromuscular disease CVA    GI/Hepatic Neg liver ROS, GERD  ,  Endo/Other  diabetes, Type 2, Oral Hypoglycemic AgentsHypothyroidism   Renal/GU negative Renal ROS      Musculoskeletal  (+) Arthritis ,   Abdominal   Peds  Hematology  (+) Blood dyscrasia (Plavix), anemia ,   Anesthesia Other Findings   Reproductive/Obstetrics                            Anesthesia Physical Anesthesia Plan  ASA: III  Anesthesia Plan: General   Post-op Pain Management:    Induction: Intravenous  PONV Risk Score and Plan: 2 and Ondansetron and Dexamethasone  Airway Management Planned: Oral ETT  Additional Equipment:   Intra-op Plan:   Post-operative Plan: Extubation in OR  Informed Consent: I have reviewed the patients History and Physical, chart, labs and discussed the procedure including the risks, benefits and alternatives for the proposed anesthesia with the patient or authorized representative who has indicated his/her understanding and acceptance.   Patient has DNR.  Discussed DNR with patient and Suspend DNR.   Dental advisory given  Plan Discussed with: CRNA  Anesthesia Plan Comments:        Anesthesia Quick Evaluation

## 2019-11-29 NOTE — Progress Notes (Signed)
Pt arrived from unit from PACU. VSS.Will continue to monitor. Pt. Oriented to unit   Andi Layfield K Lydia Toren, RN  

## 2019-11-29 NOTE — Progress Notes (Signed)
PROGRESS NOTE    Jason [name]  LPF:[id number] DOB: Oct 10, 1950 DOA: 11/25/2019 PCP: Horald Pollen, MD    Brief Narrative:  69 y.o. male with medical history significant for PVD s/p R fem-pop bypass, COPD, CAD s/p CABG, T2DM, HTN, HLD who presents with worsening bilateral lower extremity pain.  States worsening pain has been ongoing for the past month.  Worse with ambulation and better at rest.  Also noted left foot heel wound and also an ulcer on the plantar surface of the right foot.  Patient continues to be a current smoker of at least 1 pack/day.  Denies any fever.   ED Course: He was intermittently hypotensive with improvement with IV fluids.  Lab work notable for elevated creatinine 1.29 from a prior of 1.18.  Sodium of 129.  Mild leukocytosis of 10.9. No signs of necrosis on feet but patient did not have palpable or doppler findings of posterior tibial or dorsalis pedis pulse on the left side.  Right foot x-ray was negative for osteomyelitis but had incidental finding of fourth toe fracture.  ABI in the ED showed left lower extremity of 0.68 and right foot 0.81.  Last left ABI in February 2021 was 0.24.  ED physician Dr. Rex Kras discussed case with vascular surgeon Dr. Oneida Alar  Assessment & Plan:   Principal Problem:   Critical lower limb ischemia Active Problems:   DM (diabetes mellitus) ([place])   CAD, CABG Feb 2011, cath x 4 since-medical Rx   PVD, hx Rt femoral endarterectomy 2004   Tobacco abuse   Hyponatremia   Hypotension   Hypothyroidism   Wound of lower extremity   Toe fracture, right   Bilateral lower extremity claudication/Left ischemic limb with history of PVD s/p right femoropopliteal bypass - ABI in the ED with left lower extremity of 0.68 and right foot 0.81.  Last left ABI in February 2021 was 0.24.  No palpable or Doppler findings of PT or DP pulses on left side. -Patient is continued on aspirin and Plavix -Vascular Surgery following. Pt  now s/p ABI with LEA duplex study per Vascular -Vascular following, underwent US guided cannulation of L common femoral, aortogram with B LE runoff, angioplasty of R common femoral and stenting to the L external iliac on 6/7 -Pt now s/p L common femoral endarterectomy with L common fem to peroneal artery bypass for critical LLE ischemia  Left great toe nail, heel, lateral wound/ Right plantar ulcer wound at base of 4th MTP -Wound care was consulted  Right 4th toe fracture -Cont with rigid sole shoes for ambulation  Hypotension -Suspect secondary to hypovolemia.  -BP now improved and controlled at this time -Continued on metoprolol, albeit at lower dose for now with hold parameters -Remains stable at this time  AKI -Patient received 1 L normal saline fluid in the ED.  Avoid nephrotoxic agents. -Cr stable at 1.14 -Recheck bmet in AM  Hyponatremia -Was suspected secondary to hypovolemia. -Initially improved with IVF hydration -Repeat bmet in AM  Tobacco abuse -continued on nicotine patch  Type 2 diabetes -Continue with sensitive sliding scale -A1c of 7.3 -Glucose trends overall appear stable  History of CAD s/p CABG -Continue Ranexa, statin as pt tolerates  Hypothyroidism -Continue levothyroxine as pt tolerates  DVT prophylaxis: heparin subq Code Status: DNR Family Communication: Pt in room, family not at bedside  Status is: Inpatient  Remains inpatient appropriate because:Ongoing diagnostic testing needed not appropriate for outpatient work up   Dispo: The patient is from: Home  Anticipated d/c is to: Home              Anticipated d/c date is: > 3 days              Patient currently is not medically stable to d/c.  Consultants:   Vascular Surgery  Procedures:   US guided cannulation of L common femoral, aortogram with B LE runoff, angioplasty of R common femoral and stenting to the L external iliac on 6/7   L common femoral  endarterectomy with L common fem to peroneal artery bypass for critical LLE ischemia on 6/8  Antimicrobials: Anti-infectives (From admission, onward)   Start     Dose/Rate Route Frequency Ordered Stop   11/29/19 1730  ceFAZolin (ANCEF) IVPB 2g/100 mL premix     2 g 200 mL/hr over 30 Minutes Intravenous Every 8 hours 11/29/19 1556 11/30/19 0929   11/29/19 0900  ceFAZolin (ANCEF) IVPB 2g/100 mL premix     2 g 200 mL/hr over 30 Minutes Intravenous 30 min pre-op 11/28/19 2108 11/29/19 1015      Subjective: Reports adequate pain control since starting neurontin. Seen prior to vascular surgery this AM. Eager to have surgery performed  Objective: Vitals:   11/29/19 1417 11/29/19 1433 11/29/19 1448 11/29/19 1502  BP: (!) 124/53 (!) 115/46 (!) 116/46 108/61  Pulse: 80 80 76 75  Resp: 19 16 15 18   Temp: 98.2 F (36.8 C)   98.1 F (36.7 C)  TempSrc:      SpO2: 94% 93% 94% 94%  Weight:      Height:        Intake/Output Summary (Last 24 hours) at 11/29/2019 1704 Last data filed at 11/29/2019 1354 Gross per 24 hour  Intake 2641.7 ml  Output 2125 ml  Net 516.7 ml   Filed Weights   11/26/19 2113 11/28/19 2108 11/29/19 0644  Weight: 76.4 kg 73.2 kg 72.7 kg    Examination: General exam: Awake, laying in bed, in nad Respiratory system: Normal respiratory effort, no wheezing Cardiovascular system: regular rate, s1, s2 Gastrointestinal system: Soft, nondistended, positive BS Central nervous system: CN2-12 grossly intact, strength intact Extremities: Perfused, no clubbing Skin: Normal skin turgor, no notable skin lesions seen Psychiatry: Mood normal // no visual hallucinations   Data Reviewed: I have personally reviewed following labs and imaging studies  CBC: Recent Labs  Lab 11/25/19 1810 11/26/19 0304 11/27/19 0655 11/28/19 0655 11/28/19 2352 11/29/19 0402 11/29/19 1619  WBC 10.9*   < > 8.1 8.5 7.9 7.5 12.7*  NEUTROABS 8.3*  --   --   --   --   --   --   HGB 11.9*   < >  12.3* 12.3* 11.9* 11.7* 10.7*  HCT 34.7*   < > 37.3* 37.2* 36.0* 35.3* 32.5*  MCV 94.6   < > 96.6 95.6 96.0 95.9 96.7  PLT 228   < > 233 274 254 247 236   < > = values in this interval not displayed.   Basic Metabolic Panel: Recent Labs  Lab 11/26/19 0304 11/26/19 0304 11/27/19 9485 11/28/19 0655 11/28/19 2352 11/29/19 0402 11/29/19 1619  NA 132*  --  132* 130* 131* 131*  --   K 4.4  --  4.0 3.9 4.5 4.3  --   CL 101  --  97* 98 100 100  --   CO2 21*  --  22 22 22 22   --   GLUCOSE 125*  --  139* 131* 247* 163*  --  BUN 15  --  13 13 13 15   --   CREATININE 1.13   < > 1.14 1.11 1.33* 1.24 1.21  CALCIUM 10.0  --  9.7 9.3 9.1 9.1  --    < > = values in this interval not displayed.   GFR: Estimated Creatinine Clearance: 59.2 mL/min (by C-G formula based on SCr of 1.21 mg/dL). Liver Function Tests: Recent Labs  Lab 11/27/19 0655 11/28/19 0655 11/28/19 2352 11/29/19 0402  AST 12* 16 17 16   ALT 14 17 18 17   ALKPHOS 75 83 80 74  BILITOT 0.4 0.7 0.3 0.6  PROT 7.3 7.3 6.6 6.4*  ALBUMIN 3.7 3.7 3.3* 3.2*   No results for input(s): LIPASE, AMYLASE in the last 168 hours. No results for input(s): AMMONIA in the last 168 hours. Coagulation Profile: Recent Labs  Lab 11/28/19 2352  INR 1.1   Cardiac Enzymes: No results for input(s): CKTOTAL, CKMB, CKMBINDEX, TROPONINI in the last 168 hours. BNP (last 3 results) No results for input(s): PROBNP in the last 8760 hours. HbA1C: No results for input(s): HGBA1C in the last 72 hours. CBG: Recent Labs  Lab 11/28/19 1521 11/28/19 2138 11/29/19 0742 11/29/19 1016 11/29/19 1419  GLUCAP 135* 101* 117* 136* 185*   Lipid Profile: No results for input(s): CHOL, HDL, LDLCALC, TRIG, CHOLHDL, LDLDIRECT in the last 72 hours. Thyroid Function Tests: No results for input(s): TSH, T4TOTAL, FREET4, T3FREE, THYROIDAB in the last 72 hours. Anemia Panel: No results for input(s): VITAMINB12, FOLATE, FERRITIN, TIBC, IRON, RETICCTPCT in the  last 72 hours. Sepsis Labs: Recent Labs  Lab 11/25/19 1810 11/26/19 0304 11/26/19 0704  LATICACIDVEN 2.1* 1.4 1.1    Recent Results (from the past 240 hour(s))  SARS Coronavirus 2 by RT PCR (hospital order, performed in [facility] hospital lab) Nasopharyngeal Nasopharyngeal Swab     Status: None   Collection Time: 11/25/19  7:50 PM   Specimen: Nasopharyngeal Swab  Result Value Ref Range Status   SARS Coronavirus 2 NEGATIVE NEGATIVE Final    Comment: (NOTE) SARS-CoV-2 target nucleic acids are NOT DETECTED. The SARS-CoV-2 RNA is generally detectable in upper and lower respiratory specimens during the acute phase of infection. The lowest concentration of SARS-CoV-2 viral copies this assay can detect is 250 copies / mL. A negative result does not preclude SARS-CoV-2 infection and should not be used as the sole basis for treatment or other patient management decisions.  A negative result may occur with improper specimen collection / handling, submission of specimen other than nasopharyngeal swab, presence of viral mutation(s) within the areas targeted by this assay, and inadequate number of viral copies (<250 copies / mL). A negative result must be combined with clinical observations, patient history, and epidemiological information. Fact Sheet for Patients:   StrictlyIdeas.no Fact Sheet for Healthcare Providers: BankingDealers.co.za This test is not yet approved or cleared  by the [place] FDA and has been authorized for detection and/or diagnosis of SARS-CoV-2 by FDA under an Emergency Use Authorization (EUA).  This EUA will remain in effect (meaning this test can be used) for the duration of the COVID-19 declaration under Section 564(b)(1) of the Act, 21 U.S.C. section 360bbb-3(b)(1), unless the authorization is terminated or revoked sooner. Performed at [facility], [address] [place], [place] [address]     Surgical pcr screen     Status: Abnormal   Collection Time: 11/28/19 11:12 PM   Specimen: Nasal Mucosa; Nasal Swab  Result Value Ref Range Status  MRSA, PCR NEGATIVE NEGATIVE Final   Staphylococcus aureus POSITIVE (A) NEGATIVE Final    Comment: CRITICAL RESULT CALLED TO, READ BACK BY AND VERIFIED WITH: Jola Schmidt [id number] @[id number]  THANEY Performed at [facility] [address] [place], [place] [address]      Radiology Studies: PERIPHERAL VASCULAR CATHETERIZATION  Result Date: 11/28/2019 Patient name: Jason [name] MRN: [id number] DOB: 08-29-1950 Sex: male 11/28/2019 Pre-operative Diagnosis: Critical bilateral lower extremity ischemia with wounds Post-operative diagnosis:  Same Surgeon:  Erlene Quan C. Donzetta Matters, MD Procedure Performed: 1.  Ultrasound-guided cannulation left common femoral artery 2.  Aortogram with bilateral lower extremity runoff 3.  Chocolate balloon angioplasty of right common femoral artery with 6 x 40 mm balloon 4.  Stent of the left left external iliac artery with 6 x 40 mm Innova Indications: 69 year old male with history of right first and second toe amputation now has recurrent wounds there.  Has a history of a right femoral to popliteal artery bypass after previous revascularization of his right common femoral artery after cardiac catheterization.  On the left side he has had monophasic waveforms decreased ABIs but has not had pain.  He now has ulceration of the left foot including the left lateral foot left great toe and heel.  These are minor he is now indicated for angiography with possible invention likely bypass surgery. Findings: We difficulty accessing the left common femoral artery given its diminutive size.  There was a dissection we ultimately stented left external iliac artery with 6 x 40 Innova.  The aorta iliac segments do not have any flow-limiting stenosis but are heavily diseased.  On the right side the common femoral artery had 80% stenosis which was reduced  to 0% stenosis after chocolate balloon angioplasty the bypass is patent and he has runoff via the anterior tibial and peroneal arteries.  On the left side after stenting we have good inflow to the left common femoral artery.  The SFA is quite diminutive in frankly occludes appears in the mid thigh reconstitutes and above-knee popliteal artery which occludes at the level of the anterior tibial artery and he reconstitutes the peroneal artery in the mid calf. Patient will be scheduled for left common femoral to peroneal artery bypass.  Procedure:  The patient was identified in the holding area and taken to room 8.  The patient was then placed supine on the table and prepped and draped in the usual sterile fashion.  A time out was called.  Ultrasound was used to evaluate the left common femoral artery.  I could actually not pass micropuncture wire very far.  We did save an image department record.  I placed a micropuncture sheath used a Bentson wire was able to get this in part of the placed a 5 French sheath halfway.  I then performed retrograde angiography this demonstrated dissection.  I used a bare catheter and Glidewire advantage was able to steer into a true luminal plane and put an Omni catheter level of T12 performed angiography.  There was demonstratable dissection of the left external iliac artery as well as tight stenosis of the right common femoral artery.  I then crossed the bifurcation with Omni catheter and Glidewire advantage.  Patient was fully heparinized I placed a long 6 French sheath into the right external iliac artery.  I then crossed the lesion with a V 18 wire into the right femoropopliteal bypass performed angiography of the right lower extremity where I could visualize the tibial arteries to  be patent as well as the bypass throughout his course.  I then performed topical balloon angioplasty with a 6 mm balloon at nominal pressure for 3 minutes.  Completion demonstrated no residual stenosis or  dissection.  Satisfied I retracted my sheath to the common femoral artery on the left just above the cannulation site.  Perform retrograde angiography demonstrated our dissection.  This was then primarily stented over the V 18 wire with a 6 x 40 mm Innova.  This was dilated with 6 x 40 mm chocolate balloon.  Completion demonstrated no residual stenosis or dissection.  We then perform left lower extremity angiography we demonstrated peroneal artery is the dominant runoff in the mid calf.  Satisfied with this we removed our wire.  Sheath will be pulled in postoperative holding.  Patient will be scheduled for left common femoral to peroneal artery bypass tomorrow in the operating room. Contrast: 165cc Brandon C. Donzetta Matters, MD [facility] Office: [phone] Pager: [phone]   Scheduled Meds: . acetaminophen      . aspirin EC  81 mg Oral Daily  . [START ON 11/30/2019] Chlorhexidine Gluconate Cloth  6 each Topical Daily  . clopidogrel  75 mg Oral Daily  . ePHEDrine      . fentaNYL      . gabapentin  300 mg Oral BID  . [START ON 11/30/2019] heparin  5,000 Units Subcutaneous Q8H  . insulin aspart  0-9 Units Subcutaneous TID WC  . levothyroxine  50 mcg Oral Daily  . metoprolol tartrate  12.5 mg Oral BID  . mupirocin ointment  1 application Nasal BID  . nicotine  21 mg Transdermal Daily  . [START ON 11/30/2019] pantoprazole  40 mg Oral Daily  . ranolazine  1,000 mg Oral BID  . rosuvastatin  20 mg Oral Daily  . sertraline  100 mg Oral Daily   Continuous Infusions: . sodium chloride 100 mL/hr at 11/28/19 1231  . sodium chloride    . sodium chloride    .  ceFAZolin (ANCEF) IV    . magnesium sulfate bolus IVPB       LOS: 4 days   Marylu Lund, MD [facility] Pager On Amion  If 7PM-7AM, please contact night-coverage 11/29/2019, 5:04 PM

## 2019-11-29 NOTE — Op Note (Signed)
Patient name: Jason [name] MRN: [id number] DOB: Mar 11, 1951 Sex: male  11/29/2019 Pre-operative Diagnosis: Critical left lower extremity ischemia Post-operative diagnosis:  Same Surgeon:  Erlene Quan C. Donzetta Matters, MD Assistant: Arlee Muslim, PA Procedure Performed: 1.  Left common femoral endarterectomy 2.  Left common femoral to peroneal artery bypass with ipsilateral, nonreversed, translocated greater saphenous vein 3.  Harvest left greater saphenous vein  Indications: 69 year old male has a history of a right femoral to tibioperoneal trunk bypass with graft.  The day prior to this procedure he underwent balloon angioplasty of his right common femoral artery and stent of his left external iliac artery to provide inflow.  He is now indicated for left likely common femoral endarterectomy and common femoral to peroneal artery bypass.  Findings: Greater saphenous vein throughout the thigh measured 4 mm in diameter.  Below the knee it was quite a bit more diminutive measuring 3 mm at greatest diameter that appeared adequate for bypass.  Common wart was heavily calcified.  There was 1 soft area for clamping.  We performed extensive endarterectomy of the entire common femoral artery including profunda femoris arteries of which there were 3 large branches to establish good backbleeding.  After this we had very strong inflow good backbleeding from the profunda.  Peroneal artery was soft in the mid calf.  I completion we had good signal peroneal artery that was graft dependent.   Procedure:  The patient was identified in the holding area and taken to the operating room where is placed supine on the operative table general anesthesia was induced.  He was sterilely prepped and draped in the left lower extremity usual fashion antibiotics were ministered a timeout was called.  We began using ultrasound to identify the saphenous vein throughout his leg as well as his common femoral artery in the groin.  A transverse  incision was made in the groin.  We dissected down through significant scar tissue likely from prior aortogram.  We dissected the common femoral artery.  Upon of the inguinal ligament there was a soft area for clamping we placed a vessel loop around this.  We then dissected out 3.  Profunda branches placed Vesseloops individually around all of these.  The SFA was also encircled with vessel loop.  Through the same incision we then dissected out our saphenofemoral junction.  We identified the saphenofemoral junction itself dissected out the greater saphenous vein.  Through 3 skip incisions above the knee we dissected out the rest of the saphenous vein.  Below the knee we then made to further skip incisions to dissected out.  In the most distal incision we then dissected taking down the soleus.  We divided our deep fascia identified our posterior tibial these were dropped down and moved across to her peroneal artery and veins.  We dissected out the peroneal artery and Doppler was used we identified good flow.  After we identified a good distal target we transected our vein and tied off distally.  At the saphenofemoral junction we clamped with a side-biting clamp and transected the vein was passed to the back table for preparation which was done by Arlee Muslim, PA.  The saphenofemoral junction was then oversewn with 5-0 Prolene suture in a running mattress fashion.  A tunneler was placed from the distal incision in the subfascial plane and subsartorially up to the common femoral incision.  Patient was fully heparinized.  We clamped the SFA as well as her profunda branches followed by our external iliac artery.  We made a longitudinal arteriotomy at the level of the profunda branches.  It was heavily calcified there.  We used [place] weed to elevate the calcium.  We loosened our clamps on her profunda branches were able to perform eversion endarterectomy of all 3 branches.  We then remove the calcium from the common  femoral artery up into the external iliac artery.  We then cleaned up our endarterectomy site had very strong inflow we irrigated with heparinized saline and reclamped.  We brought our vein graft to the table.  We spatulated this.  We then sewed it into side with 5-0 Prolene suture.  For completion we released our distal close first line backbleeding and then our proximal clamp.  We did fill the vein graft approximately 10 cm down to good flow into our profunda branches and the foot actually appeared much pinker at this time.  We then lysed all of our valves in our vein and had very strong pulsatile flow through the entirety of the vein graft.  We clamped it distally marked it for orientation and tunneled it maintaining the orientation.  Distally we identified the peroneal artery where it was soft.  This was clamped distally and proximally opened longitudinally.  We did have actually have some good antegrade and backbleeding.  We dilated the artery with 2 and 2.5 dilators and reclamped.  Was irrigated with heparinized saline.  We straighten the leg and trimmed the vein graft to size.  It was opened longitudinally and sewed end-to-side with 6-0 Prolene suture.  Prior to completion of functional directions.  Upon completion there was very good pulsatility distally in the peroneal artery we confirmed this with Doppler we also had peroneal artery signal at the ankle that was graft dependent.  50 mg protamine was administered.  We obtain hemostasis and irrigated all of her wounds.  They were all closed with Vicryl Monocryl.  He was awake from anesthesia having tolerated procedure well laterally complication.  All counts were correct at completion.  EBL: 100 cc.     Kensy Blizard C. Donzetta Matters, MD [facility] Office: [phone] Pager: [phone]

## 2019-11-29 NOTE — Anesthesia Postprocedure Evaluation (Signed)
Anesthesia Post Note  Patient: Jason [name]  Procedure(s) Performed: Left Bypass Graft Femoral-Peroneal using non reversed Greater Sapphenous vein (Left Leg Upper)     Patient location during evaluation: PACU Anesthesia Type: General Level of consciousness: awake and alert Pain management: pain level controlled Vital Signs Assessment: post-procedure vital signs reviewed and stable Respiratory status: spontaneous breathing, nonlabored ventilation, respiratory function stable and patient connected to nasal cannula oxygen Cardiovascular status: blood pressure returned to baseline and stable Postop Assessment: no apparent nausea or vomiting Anesthetic complications: no    Last Vitals:  Vitals:   11/29/19 1851 11/29/19 2011  BP: (!) 127/54 129/61  Pulse: 86 (!) 59  Resp: 18 20  Temp:  36.4 C  SpO2: 100% 100%    Last Pain:  Vitals:   11/29/19 2011  TempSrc: Oral  PainSc: [place] Julena Barbour

## 2019-11-29 NOTE — Transfer of Care (Signed)
Immediate Anesthesia Transfer of Care Note  Patient: Jason [name]  Procedure(s) Performed: Left Bypass Graft Femoral-Peroneal using non reversed Greater Sapphenous vein (Left Leg Upper)  Patient Location: PACU  Anesthesia Type:General  Level of Consciousness: drowsy and patient cooperative  Airway & Oxygen Therapy: Patient Spontanous Breathing and Patient connected to face mask oxygen  Post-op Assessment: Report given to RN and Post -op Vital signs reviewed and stable  Post vital signs: Reviewed and stable  Last Vitals:  Vitals Value Taken Time  BP 124/53 11/29/19 1420  Temp    Pulse 73 11/29/19 1420  Resp 17 11/29/19 1420  SpO2 96 % 11/29/19 1420  Vitals shown include unvalidated device data.  Last Pain:  Vitals:   11/29/19 0953  TempSrc:   PainSc: 7       Patients Stated Pain Goal: 2 (66/59/93 5701)  Complications: No apparent anesthesia complications

## 2019-11-29 NOTE — Progress Notes (Signed)
  Progress Note    11/29/2019 10:18 AM Day of Surgery  Subjective: No overnight issues  Vitals:   11/29/19 0644 11/29/19 0744  BP: 123/66 (!) 117/52  Pulse: 67 67  Resp: 17 16  Temp: 98.5 F (36.9 C) 97.6 F (36.4 C)  SpO2: 100% 98%    Physical Exam: Awake alert oriented On lab respirations Palpable common femoral pulses bilaterally Dressing in place left side without any hematoma   CBC    Component Value Date/Time   WBC 7.5 11/29/2019 0402   RBC 3.68 (L) 11/29/2019 0402   HGB 11.7 (L) 11/29/2019 0402   HCT 35.3 (L) 11/29/2019 0402   PLT 247 11/29/2019 0402   MCV 95.9 11/29/2019 0402   MCV 96.7 12/23/2016 1549   MCH 31.8 11/29/2019 0402   MCHC 33.1 11/29/2019 0402   RDW 13.4 11/29/2019 0402   LYMPHSABS 1.5 11/25/2019 1810   MONOABS 0.9 11/25/2019 1810   EOSABS 0.2 11/25/2019 1810   BASOSABS 0.0 11/25/2019 1810    BMET    Component Value Date/Time   NA 131 (L) 11/29/2019 0402   NA 131 (L) 09/30/2017 1629   K 4.3 11/29/2019 0402   CL 100 11/29/2019 0402   CO2 22 11/29/2019 0402   GLUCOSE 163 (H) 11/29/2019 0402   BUN 15 11/29/2019 0402   BUN 13 09/30/2017 1629   CREATININE 1.24 11/29/2019 0402   CREATININE 1.14 07/12/2015 1220   CALCIUM 9.1 11/29/2019 0402   GFRNONAA 60 (L) 11/29/2019 0402   GFRNONAA 69 07/12/2015 1220   GFRAA >60 11/29/2019 0402   GFRAA 79 07/12/2015 1220    INR    Component Value Date/Time   INR 1.1 11/28/2019 2352     Intake/Output Summary (Last 24 hours) at 11/29/2019 1018 Last data filed at 11/29/2019 0500 Gross per 24 hour  Intake 941.7 ml  Output 1225 ml  Net -283.3 ml     Assessment/plan:  69 y.o. male is postop day 1 from right common femoral balloon angioplasty and stent of left external iliac artery.  Bilateral common femoral pulses are now palpable.  We will plan for left sided common femoral to peroneal artery bypass today in the OR.  I discussed risk benefits alternatives including patient is DNR but would  accept chest compressions and prolonged intubation from the operation.  He demonstrates good understanding of this.   Kaijah Abts C. Donzetta Matters, MD [facility] Office: [phone] Pager: [phone]  11/29/2019 10:18 AM

## 2019-11-29 NOTE — Anesthesia Procedure Notes (Signed)
Procedure Name: Intubation Date/Time: 11/29/2019 10:29 AM Performed by: Lowella Dell, CRNA Pre-anesthesia Checklist: Patient identified, Emergency Drugs available, Suction available and Patient being monitored Patient Re-evaluated:Patient Re-evaluated prior to induction Oxygen Delivery Method: Circle System Utilized Preoxygenation: Pre-oxygenation with 100% oxygen Induction Type: IV induction Ventilation: Mask ventilation without difficulty Laryngoscope Size: Mac and 4 Grade View: Grade I Tube type: Oral Tube size: 7.5 mm Number of attempts: 1 Airway Equipment and Method: Stylet Placement Confirmation: ETT inserted through vocal cords under direct vision,  positive ETCO2 and breath sounds checked- equal and bilateral Secured at: 22 cm Tube secured with: Tape Dental Injury: Teeth and Oropharynx as per pre-operative assessment

## 2019-11-30 LAB — CBC
HCT: 31.3 % — ABNORMAL LOW (ref 39.0–52.0)
Hemoglobin: 10.3 g/dL — ABNORMAL LOW (ref 13.0–17.0)
MCH: 32 pg (ref 26.0–34.0)
MCHC: 32.9 g/dL (ref 30.0–36.0)
MCV: 97.2 fL (ref 80.0–100.0)
Platelets: 233 10*3/uL (ref 150–400)
RBC: 3.22 MIL/uL — ABNORMAL LOW (ref 4.22–5.81)
RDW: 13.2 % (ref 11.5–15.5)
WBC: 11.5 10*3/uL — ABNORMAL HIGH (ref 4.0–10.5)
nRBC: 0 % (ref 0.0–0.2)

## 2019-11-30 LAB — GLUCOSE, CAPILLARY
Glucose-Capillary: 178 mg/dL — ABNORMAL HIGH (ref 70–99)
Glucose-Capillary: 279 mg/dL — ABNORMAL HIGH (ref 70–99)
Glucose-Capillary: 220 mg/dL — ABNORMAL HIGH (ref 70–99)
Glucose-Capillary: 192 mg/dL — ABNORMAL HIGH (ref 70–99)

## 2019-11-30 LAB — BASIC METABOLIC PANEL
Anion gap: 10 (ref 5–15)
BUN: 16 mg/dL (ref 8–23)
CO2: 20 mmol/L — ABNORMAL LOW (ref 22–32)
Calcium: 8.6 mg/dL — ABNORMAL LOW (ref 8.9–10.3)
Chloride: 99 mmol/L (ref 98–111)
Creatinine, Ser: 1.19 mg/dL (ref 0.61–1.24)
GFR calc Af Amer: >60 mL/min (ref >60)
GFR calc non Af Amer: >60 mL/min (ref >60)
Glucose, Bld: 224 mg/dL — ABNORMAL HIGH (ref 70–99)
Potassium: 4.6 mmol/L (ref 3.5–5.1)
Sodium: 129 mmol/L — ABNORMAL LOW (ref 135–145)

## 2019-11-30 MED ORDER — BISACODYL 5 MG PO TBEC
5.0000 mg | DELAYED_RELEASE_TABLET | Freq: Every day | ORAL | Status: DC | PRN
  Administered 2019-12-01 – 2019-12-04 (×3): 5 mg via ORAL
  Filled 2019-11-30 (×3): qty 1

## 2019-11-30 MED ORDER — INSULIN ASPART 100 UNIT/ML ~~LOC~~ SOLN
0.0000 [IU] | Freq: Every day | SUBCUTANEOUS | Status: DC
  Administered 2019-12-01 – 2019-12-05 (×3): 2 [IU] via SUBCUTANEOUS

## 2019-11-30 MED ORDER — INSULIN ASPART 100 UNIT/ML ~~LOC~~ SOLN
0.0000 [IU] | Freq: Three times a day (TID) | SUBCUTANEOUS | Status: DC
  Administered 2019-12-01: 5 [IU] via SUBCUTANEOUS
  Administered 2019-12-01 – 2019-12-02 (×3): 3 [IU] via SUBCUTANEOUS
  Administered 2019-12-03: 5 [IU] via SUBCUTANEOUS
  Administered 2019-12-03: 2 [IU] via SUBCUTANEOUS
  Administered 2019-12-04: 5 [IU] via SUBCUTANEOUS
  Administered 2019-12-04: 2 [IU] via SUBCUTANEOUS
  Administered 2019-12-04: 3 [IU] via SUBCUTANEOUS
  Administered 2019-12-05: 2 [IU] via SUBCUTANEOUS
  Administered 2019-12-06 (×2): 3 [IU] via SUBCUTANEOUS
  Administered 2019-12-06: 2 [IU] via SUBCUTANEOUS
  Administered 2019-12-07: 3 [IU] via SUBCUTANEOUS
  Administered 2019-12-08 (×2): 2 [IU] via SUBCUTANEOUS

## 2019-11-30 NOTE — Evaluation (Signed)
Physical Therapy Evaluation Patient Details Name: Jason [name] MRN: [id number] DOB: [birth year] Today's Date: 11/30/2019   History of Present Illness  Jason [name] is a 69 y.o. male with medical history significant for PVD s/p R fem-pop bypass, COPD, CAD s/p CABG, T2DM, HTN, HLD who presents with worsening bilateral lower extremity pain. Pt underwent L CFAl endarterectomy and L common femoral to peroneal artery bypass.    Clinical Impression  Pt admitted with above. Pt functioning at min guard assist at this time and strongly desires to return home even though he lives alone. Pt refusing to go to SNF stating "I'll starve there." Anticipate pt will progress well and be able to return home with HHPT. Pt to benefit from 24/7 assist for initial few days as pt is at increased falls risk. Pt also has to be able to complete stair negotiation x 7 steps to enter home. Acute PT to continue to follow.    Follow Up Recommendations Home health PT;Supervision/Assistance - 24 hour(24/7 initially)    Equipment Recommendations  (has RW)    Recommendations for Other Services       Precautions / Restrictions Precautions Precautions: Other (comment) Precaution Comments: pt with noted R 4th toe fx however no plan Restrictions Weight Bearing Restrictions: No Other Position/Activity Restrictions: per chart pt with R 4th toe fx, no treatment plan or post op shoe ordered      Mobility  Bed Mobility Overal bed mobility: Modified Independent Bed Mobility: Supine to Sit     Supine to sit: HOB elevated;Modified independent (Device/Increase time)     General bed mobility comments: pt reports "ive been sleeping in a recliner for 6 years for my back" Pt used bed rail, no physical assist needed  Transfers Overall transfer level: Needs assistance Equipment used: Rolling walker (2 wheeled) Transfers: Sit to/from Stand Sit to Stand: Min guard         General transfer comment: pt with good hand  placement (pushing up from bed) min guard to steady during transition of hands but no physical assist needed. wide base of support  Ambulation/Gait Ambulation/Gait assistance: Min guard Gait Distance (Feet): 100 Feet Assistive device: Rolling walker (2 wheeled) Gait Pattern/deviations: Step-through pattern;Decreased stride length;Wide base of support Gait velocity: slow Gait velocity interpretation: <1.31 ft/sec, indicative of household ambulator General Gait Details: pt with bilat LE external rotation, increased bilat UE dependence on RW due to bilat LE pain. Pt with no episode of LOB but does require RW for safe ambulation  Stairs            Wheelchair Mobility    Modified Rankin (Stroke Patients Only)       Balance Overall balance assessment: Mild deficits observed, not formally tested(requires RW for safe amb)                                           Pertinent Vitals/Pain Pain Assessment: 0-10 Pain Score: 8  Pain Location: L LE, R foot 2/10 Pain Descriptors / Indicators: Burning Pain Intervention(s): Premedicated before session    Home Living Family/patient expects to be discharged to:: Private residence Living Arrangements: Alone Available Help at Discharge: Family;Available PRN/intermittently Type of Home: House Home Access: Stairs to enter Entrance Stairs-Rails: Psychiatric nurse of Steps: 7 Home Layout: One level Home Equipment: Walker - 2 wheels;Bedside commode;Cane - single point(uses a short wood stool in a  shower)      Prior Function Level of Independence: Independent with assistive device(s)         Comments: uses cane, does grocery shopping     Hand Dominance   Dominant Hand: Right    Extremity/Trunk Assessment   Upper Extremity Assessment Upper Extremity Assessment: Overall WFL for tasks assessed    Lower Extremity Assessment Lower Extremity Assessment: RLE deficits/detail;LLE deficits/detail RLE  Deficits / Details: generalized weakness LLE Deficits / Details: pt with limited ROM at hip and knee due to incisions and recent surgery but able to move without assist LLE Sensation: (burning sensation)    Cervical / Trunk Assessment Cervical / Trunk Assessment: Kyphotic(forward head)  Communication   Communication: HOH  Cognition Arousal/Alertness: Awake/alert Behavior During Therapy: WFL for tasks assessed/performed Overall Cognitive Status: Within Functional Limits for tasks assessed                                 General Comments: pt aware of deficits and has good problem solving      General Comments General comments (skin integrity, edema, etc.): L LE incisions without drainage, VSS    Exercises Other Exercises Other Exercises: encouraged pt to complete bilat LE AROM at hips, knees, ankles to assist with stiffness   Assessment/Plan    PT Assessment Patient needs continued PT services  PT Problem List Decreased strength;Decreased range of motion;Decreased activity tolerance;Decreased balance;Decreased mobility;Decreased coordination;Decreased cognition;Decreased knowledge of use of DME;Decreased safety awareness       PT Treatment Interventions DME instruction;Gait training;Stair training;Functional mobility training;Therapeutic activities;Therapeutic exercise;Balance training    PT Goals (Current goals can be found in the Care Plan section)  Acute Rehab PT Goals Patient Stated Goal: home PT Goal Formulation: With patient Time For Goal Achievement: 12/14/19 Potential to Achieve Goals: Good    Frequency Min 3X/week   Barriers to discharge Decreased caregiver support lives alone    Co-evaluation               AM-PAC PT "6 Clicks" Mobility  Outcome Measure Help needed turning from your back to your side while in a flat bed without using bedrails?: None Help needed moving from lying on your back to sitting on the side of a flat bed without  using bedrails?: None Help needed moving to and from a bed to a chair (including a wheelchair)?: A Little Help needed standing up from a chair using your arms (e.g., wheelchair or bedside chair)?: A Little Help needed to walk in hospital room?: A Little Help needed climbing 3-5 steps with a railing? : A Lot 6 Click Score: 19    End of Session Equipment Utilized During Treatment: Gait belt Activity Tolerance: Patient tolerated treatment well Patient left: in chair;with call bell/phone within reach;with chair alarm set Nurse Communication: Mobility status PT Visit Diagnosis: Unsteadiness on feet (R26.81);Difficulty in walking, not elsewhere classified (R26.2)    Time: 6256-3893 PT Time Calculation (min) (ACUTE ONLY): 30 min   Charges:   PT Evaluation $PT Eval Moderate Complexity: 1 Mod PT Treatments $Gait Training: 8-22 mins        Kittie Plater, PT, DPT Acute Rehabilitation Services Pager #: [phone] Office #: [phone]   Berline Lopes 11/30/2019, 11:30 AM

## 2019-11-30 NOTE — Progress Notes (Signed)
Inpatient Diabetes Program Recommendations  AACE/ADA: New Consensus Statement on Inpatient Glycemic Control (2015)  Target Ranges:  Prepandial:   less than 140 mg/dL      Peak postprandial:   less than 180 mg/dL (1-2 hours)      Critically ill patients:  140 - 180 mg/dL   Lab Results  Component Value Date   GLUCAP 279 (H) 11/30/2019   HGBA1C 7.3 (H) 11/26/2019    Review of Glycemic Control Results for Jason [name], Jason [name] (MRN [id number]) as of 11/30/2019 14:55  Ref. Range 11/29/2019 14:19 11/29/2019 16:48 11/29/2019 20:57 11/30/2019 06:32 11/30/2019 11:28  Glucose-Capillary Latest Ref Range: 70 - 99 mg/dL 185 (H) 226 (H) 349 (H) 178 (H) 279 (H)     Inpatient Diabetes Program Recommendations:    Novolog 2-3 units tid with meals if eats at least 50% and cbg > 80mg /dl  Will continue to follow while inpatient.  Thank you, Reche [place], RN, BSN Diabetes Coordinator Inpatient Diabetes Program [phone] (team pager from 8a-5p)

## 2019-11-30 NOTE — Evaluation (Signed)
Occupational Therapy Evaluation Patient Details Name: Jason [name] MRN: [id number] DOB: January 08, 1951 Today's Date: 11/30/2019    History of Present Illness Jason [name] is a 69 y.o. male with medical history significant for PVD s/p R fem-pop bypass, COPD, CAD s/p CABG, T2DM, HTN, HLD who presents with worsening bilateral lower extremity pain. Pt underwent L CFAl endarterectomy and L common femoral to peroneal artery bypass.   Clinical Impression   Pt admitted with the above diagnoses and presents with below problem list. Pt will benefit from continued acute OT to address the below listed deficits and maximize independence with basic ADLs prior to d/c home. At baseline pt is mod I with ADLs, lives alone with brother able to check in on him. Pt currently min guard with LB ADLs and functional transfers/mobility.     Follow Up Recommendations  Home health OT;Supervision - Intermittent    Equipment Recommendations  None recommended by OT    Recommendations for Other Services       Precautions / Restrictions Precautions Precautions: Other (comment) Precaution Comments: pt with noted R 4th toe fx however no plan Restrictions Weight Bearing Restrictions: No Other Position/Activity Restrictions: per chart pt with R 4th toe fx, no treatment plan or post op shoe ordered      Mobility Bed Mobility               General bed mobility comments: up in chair  Transfers Overall transfer level: Needs assistance Equipment used: Rolling walker (2 wheeled) Transfers: Sit to/from Stand Sit to Stand: Min guard         General transfer comment: min guard for safety    Balance Overall balance assessment: Mild deficits observed, not formally tested                                         ADL either performed or assessed with clinical judgement   ADL Overall ADL's : Needs assistance/impaired Eating/Feeding: Set up;Sitting   Grooming: Set up;Min  guard;Sitting;Standing   Upper Body Bathing: Set up;Sitting   Lower Body Bathing: Min guard;Sit to/from stand   Upper Body Dressing : Set up;Sitting   Lower Body Dressing: Min guard;Sit to/from stand   Toilet Transfer: Min guard;Ambulation;RW   Toileting- Water quality scientist and Hygiene: Min guard;Sit to/from stand   Tub/ Shower Transfer: Min guard;Ambulation;3 in 1;Rolling walker   Functional mobility during ADLs: Min guard;Rolling walker General ADL Comments: balance and strength deficits. increased pain with mobility     Vision         Perception     Praxis      Pertinent Vitals/Pain Pain Assessment: Faces Faces Pain Scale: Hurts even more Pain Location: LLE, RLE Pain Descriptors / Indicators: Burning Pain Intervention(s): Monitored during session;Repositioned;Limited activity within patient's tolerance     Hand Dominance Right   Extremity/Trunk Assessment Upper Extremity Assessment Upper Extremity Assessment: Overall WFL for tasks assessed;Generalized weakness   Lower Extremity Assessment Lower Extremity Assessment: Defer to PT evaluation       Communication Communication Communication: HOH   Cognition Arousal/Alertness: Awake/alert Behavior During Therapy: WFL for tasks assessed/performed Overall Cognitive Status: Within Functional Limits for tasks assessed                                 General Comments: pt aware of deficits and  has good problem solving   General Comments       Exercises     Shoulder Instructions      Home Living Family/patient expects to be discharged to:: Private residence Living Arrangements: Alone Available Help at Discharge: Family;Available PRN/intermittently Type of Home: House Home Access: Stairs to enter [employer] of Steps: 7 Entrance Stairs-Rails: Right;Left Home Layout: One level     Bathroom Shower/Tub: Tub/shower unit;Walk-in shower   Bathroom Toilet: Handicapped  height Bathroom Accessibility: Yes   Home Equipment: Walker - 2 wheels;Bedside commode;Cane - single point          Prior Functioning/Environment Level of Independence: Independent with assistive device(s)        Comments: uses cane, does grocery shopping        OT Problem List: Decreased activity tolerance;Impaired balance (sitting and/or standing);Decreased knowledge of use of DME or AE;Decreased knowledge of precautions;Pain      OT Treatment/Interventions: Self-care/ADL training;Therapeutic exercise;Energy conservation;DME and/or AE instruction;Therapeutic activities;Balance training;Patient/family education    OT Goals(Current goals can be found in the care plan section) Acute Rehab OT Goals Patient Stated Goal: home OT Goal Formulation: With patient Time For Goal Achievement: 12/14/19 Potential to Achieve Goals: Good ADL Goals Pt Will Perform Lower Body Bathing: with modified independence;sit to/from stand Pt Will Perform Lower Body Dressing: with modified independence;sit to/from stand Pt Will Perform Tub/Shower Transfer: Shower transfer;with modified independence;ambulating;3 in 1;rolling walker  OT Frequency: Min 2X/week   Barriers to D/C: Decreased caregiver support          Co-evaluation              AM-PAC OT "6 Clicks" Daily Activity     Outcome Measure Help from another person eating meals?: None Help from another person taking care of personal grooming?: None Help from another person toileting, which includes using toliet, bedpan, or urinal?: A Little Help from another person bathing (including washing, rinsing, drying)?: A Little Help from another person to put on and taking off regular upper body clothing?: None Help from another person to put on and taking off regular lower body clothing?: A Little 6 Click Score: 21   End of Session Equipment Utilized During Treatment: Rolling walker  Activity Tolerance: Patient tolerated treatment  well;Patient limited by pain Patient left: in chair;with call bell/phone within reach  OT Visit Diagnosis: Unsteadiness on feet (R26.81);Muscle weakness (generalized) (M62.81);Pain                Time: 0911-0940 OT Time Calculation (min): 29 min Charges:  OT General Charges $OT Visit: 1 Visit OT Evaluation $OT Eval Low Complexity: 1 Low OT Treatments $Self Care/Home Management : 8-22 mins  Tyrone Schimke, OT Acute Rehabilitation Services Pager: [phone] Office: [phone]   Hortencia Pilar 11/30/2019, 1:04 PM

## 2019-11-30 NOTE — Care Management Important Message (Signed)
Important Message  Patient Details  Name: Jason [name] MRN: [id number] Date of Birth: Aug 15, 1950   Medicare Important Message Given:  Yes     Shelda Altes 11/30/2019, 11:31 AM

## 2019-11-30 NOTE — Progress Notes (Signed)
PROGRESS NOTE    Jason [name]  KZL:[id number] DOB: 12/31/1950 DOA: 11/25/2019 PCP: Horald Pollen, MD    Brief Narrative:  69 y.o. male with medical history significant for PVD s/p R fem-pop bypass, COPD, CAD s/p CABG, T2DM, HTN, HLD who presents with worsening bilateral lower extremity pain for about 1 month, worse with ambulation and better at rest.  Also noted left foot heel wound and also an ulcer on the plantar surface of the right foot. Patient continues to be a current smoker of at least 1 pack/day. In the ED, intermittently hypotensive with improvement with IV fluids.  Lab work notable for elevated creatinine 1.29 from a prior of 1.18.  Sodium of 129.  Mild leukocytosis of 10.9. No signs of necrosis on feet but patient did not have palpable or doppler findings of posterior tibial or dorsalis pedis pulse on the left side. Right foot x-ray was negative for osteomyelitis but had incidental finding of fourth toe fracture. ABI in the ED showed left lower extremity of 0.68 and right foot 0.81.  Last left ABI in February 2021 was 0.24. ED physician Dr. Rex Kras discussed case with vascular surgeon Dr. Oneida Alar    Assessment & Plan:   Principal Problem:   Critical lower limb ischemia Active Problems:   DM (diabetes mellitus) ([place])   CAD, CABG Feb 2011, cath x 4 since-medical Rx   PVD, hx Rt femoral endarterectomy 2004   Tobacco abuse   Hyponatremia   Hypotension   Hypothyroidism   Wound of lower extremity   Toe fracture, right   Bilateral lower extremity claudication/Left ischemic limb with history of PVD ABI in the ED with left lower extremity of 0.68 and right foot 0.81.  Last left ABI in February 2021 was 0.24.  No palpable or Doppler findings of PT or DP pulses on left side Vascular on board, underwent US guided cannulation of L common femoral, aortogram with B LE runoff, angioplasty of R common femoral and stenting to the L external iliac on 6/7 and L common femoral  endarterectomy with L common fem to peroneal artery bypass for critical LLE ischemia Patient is continued on aspirin and Plavix  Leukocytosis ?recative, afebrile, no signs of infection Daily cbc  Left great toe nail, heel, lateral wound/ Right plantar ulcer wound at base of 4th MTP Wound care was consulted  Right 4th toe fracture Cont with rigid sole shoes for ambulation  Hypertension Initially hypotensive, currently stable Continued on metoprolol, albeit at lower dose for now with hold parameters  AKI Cr stable  Daily BMP  Chronic hyponatremia Ongoing for years Daily BMP  Normocytic anemia of chronic disease Hemoglobin at baseline Daily CBC  Tobacco abuse Continued on nicotine patch  Type 2 diabetes mellitus SSI, accuchecks, hypoglycemic protocol  History of CAD s/p CABG Continue Ran[place]a, statin  Hypothyroidism Continue levothyroxine     DVT prophylaxis: heparin subq Code Status: DNR Family Communication: Discussed extensively with patient  Status is: Inpatient  Remains inpatient appropriate because:Ongoing diagnostic testing needed not appropriate for outpatient work up   Dispo: The patient is from: Home              Anticipated d/c is to: Home              Anticipated d/c date is: 2 days              Patient currently is not medically stable to d/c.  Consultants:   Vascular Surgery  Procedures:  US guided cannulation of L common femoral, aortogram with B LE runoff, angioplasty of R common femoral and stenting to the L external iliac on 6/7   L common femoral endarterectomy with L common fem to peroneal artery bypass for critical LLE ischemia on 6/8  Antimicrobials: Anti-infectives (From admission, onward)   Start     Dose/Rate Route Frequency Ordered Stop   11/29/19 1730  ceFAZolin (ANCEF) IVPB 2g/100 mL premix     2 g 200 mL/hr over 30 Minutes Intravenous Every 8 hours 11/29/19 1556 11/30/19 0109   11/29/19 0900  ceFAZolin (ANCEF)  IVPB 2g/100 mL premix     2 g 200 mL/hr over 30 Minutes Intravenous 30 min pre-op 11/28/19 2108 11/29/19 1015      Subjective: Patient denies any points, chest pain shortness of breath abdominal pain, nausea/vomiting, fever/chills.  Postop pain adequately controlled  Objective: Vitals:   11/30/19 0819 11/30/19 0820 11/30/19 1125 11/30/19 1419  BP: 128/62  (!) 114/43 (!) 129/44  Pulse: 78 80 72 82  Resp: 20  14 17   Temp: 97.6 F (36.4 C)  98 F (36.7 C) 97.9 F (36.6 C)  TempSrc: Oral  Oral Oral  SpO2: 99%  100% 100%  Weight:      Height:        Intake/Output Summary (Last 24 hours) at 11/30/2019 1827 Last data filed at 11/30/2019 1300 Gross per 24 hour  Intake 820.07 ml  Output 1750 ml  Net -929.93 ml   Filed Weights   11/26/19 2113 11/28/19 2108 11/29/19 0644  Weight: 76.4 kg 73.2 kg 72.7 kg    Examination:  General: NAD   Cardiovascular: S1, S2 present  Respiratory: CTAB  Abdomen: Soft, nontender, nondistended, bowel sounds present  Musculoskeletal: No bilateral pedal edema noted  Skin: Normal  Psychiatry: Normal mood    Data Reviewed: I have personally reviewed following labs and imaging studies  CBC: Recent Labs  Lab 11/25/19 1810 11/26/19 0304 11/28/19 0655 11/28/19 2352 11/29/19 0402 11/29/19 1619 11/30/19 0432  WBC 10.9*   < > 8.5 7.9 7.5 12.7* 11.5*  NEUTROABS 8.3*  --   --   --   --   --   --   HGB 11.9*   < > 12.3* 11.9* 11.7* 10.7* 10.3*  HCT 34.7*   < > 37.2* 36.0* 35.3* 32.5* 31.3*  MCV 94.6   < > 95.6 96.0 95.9 96.7 97.2  PLT 228   < > 274 254 247 236 233   < > = values in this interval not displayed.   Basic Metabolic Panel: Recent Labs  Lab 11/27/19 0655 11/27/19 0655 11/28/19 0655 11/28/19 2352 11/29/19 0402 11/29/19 1619 11/30/19 0432  NA 132*  --  130* 131* 131*  --  129*  K 4.0  --  3.9 4.5 4.3  --  4.6  CL 97*  --  98 100 100  --  99  CO2 22  --  22 22 22   --  20*  GLUCOSE 139*  --  131* 247* 163*  --  224*    BUN 13  --  13 13 15   --  16  CREATININE 1.14   < > 1.11 1.33* 1.24 1.21 1.19  CALCIUM 9.7  --  9.3 9.1 9.1  --  8.6*   < > = values in this interval not displayed.   GFR: Estimated Creatinine Clearance: 60.2 mL/min (by C-G formula based on SCr of 1.19 mg/dL). Liver Function Tests: Recent Labs  Lab 11/27/19 0655 11/28/19 0655 11/28/19 2352 11/29/19 0402  AST 12* 16 17 16   ALT 14 17 18 17   ALKPHOS 75 83 80 74  BILITOT 0.4 0.7 0.3 0.6  PROT 7.3 7.3 6.6 6.4*  ALBUMIN 3.7 3.7 3.3* 3.2*   No results for input(s): LIPASE, AMYLASE in the last 168 hours. No results for input(s): AMMONIA in the last 168 hours. Coagulation Profile: Recent Labs  Lab 11/28/19 2352  INR 1.1   Cardiac Enzymes: No results for input(s): CKTOTAL, CKMB, CKMBINDEX, TROPONINI in the last 168 hours. BNP (last 3 results) No results for input(s): PROBNP in the last 8760 hours. HbA1C: No results for input(s): HGBA1C in the last 72 hours. CBG: Recent Labs  Lab 11/29/19 1648 11/29/19 2057 11/30/19 0632 11/30/19 1128 11/30/19 1550  GLUCAP 226* 349* 178* 279* 220*   Lipid Profile: No results for input(s): CHOL, HDL, LDLCALC, TRIG, CHOLHDL, LDLDIRECT in the last 72 hours. Thyroid Function Tests: No results for input(s): TSH, T4TOTAL, FREET4, T3FREE, THYROIDAB in the last 72 hours. Anemia Panel: No results for input(s): VITAMINB12, FOLATE, FERRITIN, TIBC, IRON, RETICCTPCT in the last 72 hours. Sepsis Labs: Recent Labs  Lab 11/25/19 1810 11/26/19 0304 11/26/19 0704  LATICACIDVEN 2.1* 1.4 1.1    Recent Results (from the past 240 hour(s))  SARS Coronavirus 2 by RT PCR (hospital order, performed in [facility] hospital lab) Nasopharyngeal Nasopharyngeal Swab     Status: None   Collection Time: 11/25/19  7:50 PM   Specimen: Nasopharyngeal Swab  Result Value Ref Range Status   SARS Coronavirus 2 NEGATIVE NEGATIVE Final    Comment: (NOTE) SARS-CoV-2 target nucleic acids are NOT DETECTED. The  SARS-CoV-2 RNA is generally detectable in upper and lower respiratory specimens during the acute phase of infection. The lowest concentration of SARS-CoV-2 viral copies this assay can detect is 250 copies / mL. A negative result does not preclude SARS-CoV-2 infection and should not be used as the sole basis for treatment or other patient management decisions.  A negative result may occur with improper specimen collection / handling, submission of specimen other than nasopharyngeal swab, presence of viral mutation(s) within the areas targeted by this assay, and inadequate number of viral copies (<250 copies / mL). A negative result must be combined with clinical observations, patient history, and epidemiological information. Fact Sheet for Patients:   StrictlyIdeas.no Fact Sheet for Healthcare Providers: BankingDealers.co.za This test is not yet approved or cleared  by the [place] FDA and has been authorized for detection and/or diagnosis of SARS-CoV-2 by FDA under an Emergency Use Authorization (EUA).  This EUA will remain in effect (meaning this test can be used) for the duration of the COVID-19 declaration under Section 564(b)(1) of the Act, 21 U.S.C. section 360bbb-3(b)(1), unless the authorization is terminated or revoked sooner. Performed at [facility], [address], [place], [place] [address]   Surgical pcr screen     Status: Abnormal   Collection Time: 11/28/19 11:12 PM   Specimen: Nasal Mucosa; Nasal Swab  Result Value Ref Range Status   MRSA, PCR NEGATIVE NEGATIVE Final   Staphylococcus aureus POSITIVE (A) NEGATIVE Final    Comment: CRITICAL RESULT CALLED TO, READ BACK BY AND VERIFIED WITH: Jola Schmidt [id number] @0139  THANEY Performed at [facility] [address], [place], [place] [address]      Radiology Studies: No results found.  Scheduled Meds: . aspirin EC  81 mg Oral Daily  .  Chlorhexidine  Gluconate Cloth  6 each Topical Daily  . clopidogrel  75 mg Oral Daily  . gabapentin  300 mg Oral BID  . heparin  5,000 Units Subcutaneous Q8H  . insulin aspart  0-9 Units Subcutaneous TID WC  . levothyroxine  50 mcg Oral Daily  . metoprolol tartrate  12.5 mg Oral BID  . mupirocin ointment  1 application Nasal BID  . nicotine  21 mg Transdermal Daily  . pantoprazole  40 mg Oral Daily  . ranolazine  1,000 mg Oral BID  . rosuvastatin  20 mg Oral Daily  . sertraline  100 mg Oral Daily   Continuous Infusions: . sodium chloride 100 mL/hr at 11/28/19 1231  . sodium chloride    . magnesium sulfate bolus IVPB       LOS: 5 days   Alma Friendly, MD [facility] Pager On Amion  If 7PM-7AM, please contact night-coverage 11/30/2019, 6:27 PM

## 2019-11-30 NOTE — Progress Notes (Signed)
Mobility Specialist: Progress Note    11/30/19 1426  Mobility  Activity Ambulated in hall  Level of Assistance Contact guard assist, steadying assist  Assistive Device Front wheel walker  Distance Ambulated (ft) 420 ft  Mobility Response Tolerated well  Mobility performed by Mobility specialist  Bed Position Chair  $Mobility charge 1 Mobility   Pre-Mobility: 77 HR, 97/48 BP Post-Mobility: 98 HR, 129/44 BP   Pt c/o of soreness/stiffness in both calf's during ambulation.   [facility] Daschel Roughton Mobility Specialist

## 2019-11-30 NOTE — Progress Notes (Addendum)
  Progress Note    11/30/2019 7:42 AM 1 Day Post-Op  Subjective:  No complaints this morning   Vitals:   11/29/19 2319 11/30/19 0439  BP: 136/65 120/65  Pulse:  66  Resp: 19 19  Temp: 97.7 F (36.5 C) 97.7 F (36.5 C)  SpO2: 100% 100%   Physical Exam: Lungs:  Non labored Incisions:  L groin and all LLE incisions c/d/i without hematoma Extremities:  L GT wound stable; palpable L ATA Neurologic: A&O  CBC    Component Value Date/Time   WBC 11.5 (H) 11/30/2019 0432   RBC 3.22 (L) 11/30/2019 0432   HGB 10.3 (L) 11/30/2019 0432   HCT 31.3 (L) 11/30/2019 0432   PLT 233 11/30/2019 0432   MCV 97.2 11/30/2019 0432   MCV 96.7 12/23/2016 1549   MCH 32.0 11/30/2019 0432   MCHC 32.9 11/30/2019 0432   RDW 13.2 11/30/2019 0432   LYMPHSABS 1.5 11/25/2019 1810   MONOABS 0.9 11/25/2019 1810   EOSABS 0.2 11/25/2019 1810   BASOSABS 0.0 11/25/2019 1810    BMET    Component Value Date/Time   NA 129 (L) 11/30/2019 0432   NA 131 (L) 09/30/2017 1629   K 4.6 11/30/2019 0432   CL 99 11/30/2019 0432   CO2 20 (L) 11/30/2019 0432   GLUCOSE 224 (H) 11/30/2019 0432   BUN 16 11/30/2019 0432   BUN 13 09/30/2017 1629   CREATININE 1.19 11/30/2019 0432   CREATININE 1.14 07/12/2015 1220   CALCIUM 8.6 (L) 11/30/2019 0432   GFRNONAA >60 11/30/2019 0432   GFRNONAA 69 07/12/2015 1220   GFRAA >60 11/30/2019 0432   GFRAA 79 07/12/2015 1220    INR    Component Value Date/Time   INR 1.1 11/28/2019 2352     Intake/Output Summary (Last 24 hours) at 11/30/2019 0742 Last data filed at 11/30/2019 0459 Gross per 24 hour  Intake 1800.07 ml  Output 2350 ml  Net -549.93 ml     Assessment/Plan:  69 y.o. male is s/p L CFA endarterectomy and femoral to peroneal bypass 1 Day Post-Op   L foot well perfused with palpable ATA pulse PT/OT to evaluate Discharge home when mobility improved Foot wounds will be followed as outpt   Dagoberto Ligas, PA-C [facility] [phone] 11/30/2019 7:42 AM  I have independently interviewed and examined patient and agree with PA assessment and plan above.   Leonie Amacher C. Donzetta Matters, MD [facility] [place] Office: [phone] Pager: [phone]

## 2019-12-01 LAB — CBC WITH DIFFERENTIAL/PLATELET
Abs Immature Granulocytes: 0.05 10*3/uL (ref 0.00–0.07)
Basophils Absolute: 0 10*3/uL (ref 0.0–0.1)
Basophils Relative: 0 %
Eosinophils Absolute: 0.1 10*3/uL (ref 0.0–0.5)
Eosinophils Relative: 1 %
HCT: 26.8 % — ABNORMAL LOW (ref 39.0–52.0)
Hemoglobin: 9 g/dL — ABNORMAL LOW (ref 13.0–17.0)
Immature Granulocytes: 0 %
Lymphocytes Relative: 13 %
Lymphs Abs: 1.5 10*3/uL (ref 0.7–4.0)
MCH: 32.1 pg (ref 26.0–34.0)
MCHC: 33.6 g/dL (ref 30.0–36.0)
MCV: 95.7 fL (ref 80.0–100.0)
Monocytes Absolute: 1.3 10*3/uL — ABNORMAL HIGH (ref 0.1–1.0)
Monocytes Relative: 11 %
Neutro Abs: 8.9 10*3/uL — ABNORMAL HIGH (ref 1.7–7.7)
Neutrophils Relative %: 75 %
Platelets: 207 10*3/uL (ref 150–400)
RBC: 2.8 MIL/uL — ABNORMAL LOW (ref 4.22–5.81)
RDW: 13.4 % (ref 11.5–15.5)
WBC: 11.7 10*3/uL — ABNORMAL HIGH (ref 4.0–10.5)
nRBC: 0 % (ref 0.0–0.2)

## 2019-12-01 LAB — BASIC METABOLIC PANEL
Anion gap: 10 (ref 5–15)
BUN: 20 mg/dL (ref 8–23)
CO2: 19 mmol/L — ABNORMAL LOW (ref 22–32)
Calcium: 8.4 mg/dL — ABNORMAL LOW (ref 8.9–10.3)
Chloride: 99 mmol/L (ref 98–111)
Creatinine, Ser: 1.25 mg/dL — ABNORMAL HIGH (ref 0.61–1.24)
GFR calc Af Amer: >60 mL/min (ref >60)
GFR calc non Af Amer: 59 mL/min — ABNORMAL LOW (ref >60)
Glucose, Bld: 217 mg/dL — ABNORMAL HIGH (ref 70–99)
Potassium: 4.5 mmol/L (ref 3.5–5.1)
Sodium: 128 mmol/L — ABNORMAL LOW (ref 135–145)

## 2019-12-01 LAB — LIPID PANEL
Cholesterol: 103 mg/dL (ref 0–200)
HDL: 29 mg/dL — ABNORMAL LOW (ref >40)
LDL Cholesterol: 43 mg/dL (ref 0–99)
Total CHOL/HDL Ratio: 3.6 RATIO
Triglycerides: 156 mg/dL — ABNORMAL HIGH (ref <150)
VLDL: 31 mg/dL (ref 0–40)

## 2019-12-01 LAB — GLUCOSE, CAPILLARY
Glucose-Capillary: 203 mg/dL — ABNORMAL HIGH (ref 70–99)
Glucose-Capillary: 161 mg/dL — ABNORMAL HIGH (ref 70–99)
Glucose-Capillary: 159 mg/dL — ABNORMAL HIGH (ref 70–99)
Glucose-Capillary: 204 mg/dL — ABNORMAL HIGH (ref 70–99)

## 2019-12-01 MED ORDER — SODIUM CHLORIDE 0.9 % IV SOLN: INTRAVENOUS | Status: DC

## 2019-12-01 NOTE — Progress Notes (Signed)
Physical Therapy Treatment Patient Details Name: Jason [name] MRN: [id number] DOB: 11/20/1950 Today's Date: 12/01/2019    History of Present Illness Jason [name] is a 69 y.o. male with medical history significant for PVD s/p R fem-pop bypass, COPD, CAD s/p CABG, T2DM, HTN, HLD who presents with worsening bilateral lower extremity pain. Pt underwent L CFAl endarterectomy and L common femoral to peroneal artery bypass.    PT Comments    Pt not moving as well this morning. Lowered walker and he did better but not as well as yesterday. Pt tells me his brother can help him at DC. Pt declines SNF.    Follow Up Recommendations  Home health PT;Supervision/Assistance - 24 hour (pt refusing SNF and says brother can assist at DC)     Equipment Recommendations  None recommended by PT    Recommendations for Other Services       Precautions / Restrictions Precautions Precautions: Other (comment) Precaution Comments: pt with noted R 4th toe fx however no plan Restrictions Other Position/Activity Restrictions: per chart pt with incidental finding R 4th toe fx, no treatment plan or post op shoe ordered    Mobility  Bed Mobility Overal bed mobility: Needs Assistance Bed Mobility: Supine to Sit     Supine to sit: Min assist;HOB elevated     General bed mobility comments: Assist to move LLE off of bed and to elevate trunk into sitting.  Transfers Overall transfer level: Needs assistance Equipment used: Rolling walker (2 wheeled) Transfers: Sit to/from Stand Sit to Stand: Min assist         General transfer comment: Assist for balance.  Ambulation/Gait Ambulation/Gait assistance: Min assist Gait Distance (Feet): 60 Feet (60' x 1, 20' x 1) Assistive device: Rolling walker (2 wheeled) Gait Pattern/deviations: Step-through pattern;Wide base of support;Decreased step length - right;Decreased step length - left;Shuffle Gait velocity: decr Gait velocity interpretation: <1.8  ft/sec, indicate of risk for recurrent falls General Gait Details: Min assist for balance and frequent cues to stay closer to walker with pt pushing walker too far forward creating anterior lean. Pt also grabbing wall rail with one hand several times. After sitting rest break lowered walker and pt with improved gait pattern and control   Stairs             Wheelchair Mobility    Modified Rankin (Stroke Patients Only)       Balance Overall balance assessment: Needs assistance Sitting-balance support: No upper extremity supported;Feet supported Sitting balance-Leahy Scale: Fair     Standing balance support: Bilateral upper extremity supported Standing balance-Leahy Scale: Poor Standing balance comment: walker and min assist for static standing                            Cognition Arousal/Alertness: Awake/alert Behavior During Therapy: WFL for tasks assessed/performed Overall Cognitive Status: Within Functional Limits for tasks assessed                                        Exercises      General Comments        Pertinent Vitals/Pain Pain Assessment: Faces Faces Pain Scale: Hurts even more Pain Location: LLE Pain Descriptors / Indicators: Sore Pain Intervention(s): Monitored during session;Repositioned    Home Living  Prior Function            PT Goals (current goals can now be found in the care plan section) Acute Rehab PT Goals Patient Stated Goal: home Progress towards PT goals: Not progressing toward goals - comment    Frequency    Min 3X/week      PT Plan Current plan remains appropriate    Co-evaluation              AM-PAC PT "6 Clicks" Mobility   Outcome Measure  Help needed turning from your back to your side while in a flat bed without using bedrails?: None Help needed moving from lying on your back to sitting on the side of a flat bed without using bedrails?: A  Little Help needed moving to and from a bed to a chair (including a wheelchair)?: A Little Help needed standing up from a chair using your arms (e.g., wheelchair or bedside chair)?: A Little Help needed to walk in hospital room?: A Little Help needed climbing 3-5 steps with a railing? : A Lot 6 Click Score: 18    End of Session Equipment Utilized During Treatment: Gait belt Activity Tolerance: Patient limited by fatigue Patient left: in chair;with call bell/phone within reach;with chair alarm set Nurse Communication: Mobility status PT Visit Diagnosis: Unsteadiness on feet (R26.81);Difficulty in walking, not elsewhere classified (R26.2)     Time: 5364-6803 PT Time Calculation (min) (ACUTE ONLY): 26 min  Charges:  $Gait Training: 23-37 mins                     [place] Pager [phone] [place] 12/01/2019, 2:35 PM

## 2019-12-01 NOTE — Progress Notes (Addendum)
  Progress Note    12/01/2019 7:28 AM 2 Days Post-Op  Subjective:  Says he is a little sore  Tm 99.8 HR 80's-90's NSR 025'K-270'W systolic 23% RA  Vitals:   11/30/19 2323 12/01/19 0450  BP: (!) 107/47 (!) 118/57  Pulse: 89 90  Resp: 20 20  Temp: 99.5 F (37.5 C) 99.8 F (37.7 C)  SpO2: 90% 92%    Physical Exam: Cardiac:  regular Lungs:  Non labored Incisions:  All incisions look good Extremities:  Brisk left DP doppler signal   CBC    Component Value Date/Time   WBC 11.7 (H) 12/01/2019 0254   RBC 2.80 (L) 12/01/2019 0254   HGB 9.0 (L) 12/01/2019 0254   HCT 26.8 (L) 12/01/2019 0254   PLT 207 12/01/2019 0254   MCV 95.7 12/01/2019 0254   MCV 96.7 12/23/2016 1549   MCH 32.1 12/01/2019 0254   MCHC 33.6 12/01/2019 0254   RDW 13.4 12/01/2019 0254   LYMPHSABS 1.5 12/01/2019 0254   MONOABS 1.3 (H) 12/01/2019 0254   EOSABS 0.1 12/01/2019 0254   BASOSABS 0.0 12/01/2019 0254    BMET    Component Value Date/Time   NA 128 (L) 12/01/2019 0254   NA 131 (L) 09/30/2017 1629   K 4.5 12/01/2019 0254   CL 99 12/01/2019 0254   CO2 19 (L) 12/01/2019 0254   GLUCOSE 217 (H) 12/01/2019 0254   BUN 20 12/01/2019 0254   BUN 13 09/30/2017 1629   CREATININE 1.25 (H) 12/01/2019 0254   CREATININE 1.14 07/12/2015 1220   CALCIUM 8.4 (L) 12/01/2019 0254   GFRNONAA 59 (L) 12/01/2019 0254   GFRNONAA 69 07/12/2015 1220   GFRAA >60 12/01/2019 0254   GFRAA 79 07/12/2015 1220    INR    Component Value Date/Time   INR 1.1 11/28/2019 2352     Intake/Output Summary (Last 24 hours) at 12/01/2019 0728 Last data filed at 11/30/2019 1300 Gross per 24 hour  Intake 720 ml  Output 300 ml  Net 420 ml     Assessment:  69 y.o. male is s/p:  1.  Left common femoral endarterectomy 2.  Left common femoral to peroneal artery bypass with ipsilateral, nonreversed, translocated greater saphenous vein 3.  Harvest left greater saphenous vein  2 Days Post-Op  Plan: -pt with brisk left DP  doppler signal -pt needs to float heels off the bed -oob and increase mobility today -DVT prophylaxis:  Sq heparin   Leontine Locket, PA-C [facility] [phone] 12/01/2019 7:28 AM  I have independently interviewed and examined patient and agree with PA assessment and plan above.   Ruhama Lehew C. Donzetta Matters, MD [facility] [place] Office: [phone] Pager: [phone]

## 2019-12-01 NOTE — Progress Notes (Signed)
Mobility Specialist: Progress Note    12/01/19 1635  Mobility  Activity Ambulated in hall  Level of Assistance Minimal assist, patient does 75% or more  Assistive Device Front wheel walker  Distance Ambulated (ft) 140 ft  Mobility Response Tolerated fair  Mobility performed by Mobility specialist  $Mobility charge 1 Mobility   Pre-Mobility: 75 HR, 114/54 BP, 99% SpO2 Post-Mobility: 86 HR  Pt had to stop to take one seated rest break halfway through ambulation b/c of calf's feeling stiff/tight.   [facility] Alanta Scobey Mobility Specialist

## 2019-12-01 NOTE — Progress Notes (Signed)
PROGRESS NOTE    Jason [name]  OMV:[id number] DOB: October 08, 1950 DOA: 11/25/2019 PCP: Horald Pollen, MD    Brief Narrative:  69 y.o. male with medical history significant for PVD s/p R fem-pop bypass, COPD, CAD s/p CABG, T2DM, HTN, HLD who presents with worsening bilateral lower extremity pain for about 1 month, worse with ambulation and better at rest.  Also noted left foot heel wound and also an ulcer on the plantar surface of the right foot. Patient continues to be a current smoker of at least 1 pack/day. In the ED, intermittently hypotensive with improvement with IV fluids.  Lab work notable for elevated creatinine 1.29 from a prior of 1.18.  Sodium of 129.  Mild leukocytosis of 10.9. No signs of necrosis on feet but patient did not have palpable or doppler findings of posterior tibial or dorsalis pedis pulse on the left side. Right foot x-ray was negative for osteomyelitis but had incidental finding of fourth toe fracture. ABI in the ED showed left lower extremity of 0.68 and right foot 0.81.  Last left ABI in February 2021 was 0.24. ED physician Dr. Rex Kras discussed case with vascular surgeon Dr. Oneida Alar    Assessment & Plan:   Principal Problem:   Critical lower limb ischemia Active Problems:   DM (diabetes mellitus) ([place])   CAD, CABG Feb 2011, cath x 4 since-medical Rx   PVD, hx Rt femoral endarterectomy 2004   Tobacco abuse   Hyponatremia   Hypotension   Hypothyroidism   Wound of lower extremity   Toe fracture, right   Bilateral lower extremity claudication/Left ischemic limb with history of PVD ABI in the ED with left lower extremity of 0.68 and right foot 0.81.  Last left ABI in February 2021 was 0.24.  No palpable or Doppler findings of PT or DP pulses on left side Vascular on board, underwent US guided cannulation of L common femoral, aortogram with B LE runoff, angioplasty of R common femoral and stenting to the L external iliac on 6/7 and L common femoral  endarterectomy with L common fem to peroneal artery bypass for critical LLE ischemia Patient is continued on aspirin and Plavix  Leukocytosis ?recative, afebrile, no signs of infection Daily cbc  Left great toe nail, heel, lateral wound/ Right plantar ulcer wound at base of 4th MTP Wound care was consulted  Right 4th toe fracture Cont with rigid sole shoes for ambulation  Hypertension Initially hypotensive, currently stable Continued on metoprolol with hold parameters  AKI/metabolic acidosis Cr uptrending (?contrast) Restart IVF Daily BMP  Chronic hyponatremia Ongoing for years Daily BMP  Normocytic anemia of chronic disease Hemoglobin at baseline Daily CBC  Tobacco abuse Continued on nicotine patch  Type 2 diabetes mellitus SSI, accuchecks, hypoglycemic protocol  History of CAD s/p CABG Continue Ranexa, statin  Hypothyroidism Continue levothyroxine     DVT prophylaxis: heparin subq Code Status: DNR Family Communication: Discussed extensively with patient  Status is: Inpatient  Remains inpatient appropriate because:Ongoing diagnostic testing needed not appropriate for outpatient work up   Dispo: The patient is from: Home              Anticipated d/c is to: Home              Anticipated d/c date is: 2 days              Patient currently is not medically stable to d/c.  Consultants:   Vascular Surgery  Procedures:   US guided cannulation  of L common femoral, aortogram with B LE runoff, angioplasty of R common femoral and stenting to the L external iliac on 6/7   L common femoral endarterectomy with L common fem to peroneal artery bypass for critical LLE ischemia on 6/8  Antimicrobials: Anti-infectives (From admission, onward)   Start     Dose/Rate Route Frequency Ordered Stop   11/29/19 1730  ceFAZolin (ANCEF) IVPB 2g/100 mL premix        2 g 200 mL/hr over 30 Minutes Intravenous Every 8 hours 11/29/19 1556 11/30/19 0109   11/29/19 0900   ceFAZolin (ANCEF) IVPB 2g/100 mL premix        2 g 200 mL/hr over 30 Minutes Intravenous 30 min pre-op 11/28/19 2108 11/29/19 1015      Subjective: Patient denies any new complaints.  Objective: Vitals:   11/30/19 2323 12/01/19 0450 12/01/19 0800 12/01/19 1139  BP: (!) 107/47 (!) 118/57 (!) 137/56 (!) 112/52  Pulse: 89 90 93 81  Resp: 20 20 19 20   Temp: 99.5 F (37.5 C) 99.8 F (37.7 C) 98.7 F (37.1 C) 99.2 F (37.3 C)  TempSrc: Oral Oral Oral Oral  SpO2: 90% 92% 100% 93%  Weight:      Height:        Intake/Output Summary (Last 24 hours) at 12/01/2019 1728 Last data filed at 12/01/2019 1517 Gross per 24 hour  Intake 2004.77 ml  Output --  Net 2004.77 ml   Filed Weights   11/26/19 2113 11/28/19 2108 11/29/19 0644  Weight: 76.4 kg 73.2 kg 72.7 kg    Examination:  General: NAD   Cardiovascular: S1, S2 present  Respiratory: CTAB  Abdomen: Soft, nontender, nondistended, bowel sounds present  Musculoskeletal: No bilateral pedal edema noted  Skin: Normal  Psychiatry: Normal mood    Data Reviewed: I have personally reviewed following labs and imaging studies  CBC: Recent Labs  Lab 11/25/19 1810 11/26/19 0304 11/28/19 2352 11/29/19 0402 11/29/19 1619 11/30/19 0432 12/01/19 0254  WBC 10.9*   < > 7.9 7.5 12.7* 11.5* 11.7*  NEUTROABS 8.3*  --   --   --   --   --  8.9*  HGB 11.9*   < > 11.9* 11.7* 10.7* 10.3* 9.0*  HCT 34.7*   < > 36.0* 35.3* 32.5* 31.3* 26.8*  MCV 94.6   < > 96.0 95.9 96.7 97.2 95.7  PLT 228   < > 254 247 236 233 207   < > = values in this interval not displayed.   Basic Metabolic Panel: Recent Labs  Lab 11/28/19 0655 11/28/19 0655 11/28/19 2352 11/29/19 0402 11/29/19 1619 11/30/19 0432 12/01/19 0254  NA 130*  --  131* 131*  --  129* 128*  K 3.9  --  4.5 4.3  --  4.6 4.5  CL 98  --  100 100  --  99 99  CO2 22  --  22 22  --  20* 19*  GLUCOSE 131*  --  247* 163*  --  224* 217*  BUN 13  --  13 15  --  16 20  CREATININE  1.11   < > 1.33* 1.24 1.21 1.19 1.25*  CALCIUM 9.3  --  9.1 9.1  --  8.6* 8.4*   < > = values in this interval not displayed.   GFR: Estimated Creatinine Clearance: 57.3 mL/min (A) (by C-G formula based on SCr of 1.25 mg/dL (H)). Liver Function Tests: Recent Labs  Lab 11/27/19 0655 11/28/19 0655 11/28/19 2352  11/29/19 0402  AST 12* 16 17 16   ALT 14 17 18 17   ALKPHOS 75 83 80 74  BILITOT 0.4 0.7 0.3 0.6  PROT 7.3 7.3 6.6 6.4*  ALBUMIN 3.7 3.7 3.3* 3.2*   No results for input(s): LIPASE, AMYLASE in the last 168 hours. No results for input(s): AMMONIA in the last 168 hours. Coagulation Profile: Recent Labs  Lab 11/28/19 2352  INR 1.1   Cardiac Enzymes: No results for input(s): CKTOTAL, CKMB, CKMBINDEX, TROPONINI in the last 168 hours. BNP (last 3 results) No results for input(s): PROBNP in the last 8760 hours. HbA1C: No results for input(s): HGBA1C in the last 72 hours. CBG: Recent Labs  Lab 11/30/19 1550 11/30/19 2044 12/01/19 0604 12/01/19 1141 12/01/19 1658  GLUCAP 220* 192* 203* 161* 159*   Lipid Profile: Recent Labs    12/01/19 0254  CHOL 103  HDL 29*  LDLCALC 43  TRIG 156*  CHOLHDL 3.6   Thyroid Function Tests: No results for input(s): TSH, T4TOTAL, FREET4, T3FREE, THYROIDAB in the last 72 hours. Anemia Panel: No results for input(s): VITAMINB12, FOLATE, FERRITIN, TIBC, IRON, RETICCTPCT in the last 72 hours. Sepsis Labs: Recent Labs  Lab 11/25/19 1810 11/26/19 0304 11/26/19 0704  LATICACIDVEN 2.1* 1.4 1.1    Recent Results (from the past 240 hour(s))  SARS Coronavirus 2 by RT PCR (hospital order, performed in [facility] hospital lab) Nasopharyngeal Nasopharyngeal Swab     Status: None   Collection Time: 11/25/19  7:50 PM   Specimen: Nasopharyngeal Swab  Result Value Ref Range Status   SARS Coronavirus 2 NEGATIVE NEGATIVE Final    Comment: (NOTE) SARS-CoV-2 target nucleic acids are NOT DETECTED. The SARS-CoV-2 RNA is generally detectable  in upper and lower respiratory specimens during the acute phase of infection. The lowest concentration of SARS-CoV-2 viral copies this assay can detect is 250 copies / mL. A negative result does not preclude SARS-CoV-2 infection and should not be used as the sole basis for treatment or other patient management decisions.  A negative result may occur with improper specimen collection / handling, submission of specimen other than nasopharyngeal swab, presence of viral mutation(s) within the areas targeted by this assay, and inadequate number of viral copies (<250 copies / mL). A negative result must be combined with clinical observations, patient history, and epidemiological information. Fact Sheet for Patients:   StrictlyIdeas.no Fact Sheet for Healthcare Providers: BankingDealers.co.za This test is not yet approved or cleared  by the [place] FDA and has been authorized for detection and/or diagnosis of SARS-CoV-2 by FDA under an Emergency Use Authorization (EUA).  This EUA will remain in effect (meaning this test can be used) for the duration of the COVID-19 declaration under Section 564(b)(1) of the Act, 21 U.S.C. section 360bbb-3(b)(1), unless the authorization is terminated or revoked sooner. Performed at [facility], [address], [place], [place] [address]   Surgical pcr screen     Status: Abnormal   Collection Time: 11/28/19 11:12 PM   Specimen: Nasal Mucosa; Nasal Swab  Result Value Ref Range Status   MRSA, PCR NEGATIVE NEGATIVE Final   Staphylococcus aureus POSITIVE (A) NEGATIVE Final    Comment: CRITICAL RESULT CALLED TO, READ BACK BY AND VERIFIED WITH: Jola Schmidt [id number] @0139  THANEY Performed at [facility] [address], [place], [place] [address]      Radiology Studies: No results found.  Scheduled Meds: . aspirin EC  81 mg Oral Daily  .  Chlorhexidine Gluconate Cloth  6 each Topical  Daily  . clopidogrel  75 mg Oral Daily  . gabapentin  300 mg Oral BID  . heparin  5,000 Units Subcutaneous Q8H  . insulin aspart  0-15 Units Subcutaneous TID WC  . insulin aspart  0-5 Units Subcutaneous QHS  . levothyroxine  50 mcg Oral Daily  . metoprolol tartrate  12.5 mg Oral BID  . mupirocin ointment  1 application Nasal BID  . nicotine  21 mg Transdermal Daily  . pantoprazole  40 mg Oral Daily  . ranolazine  1,000 mg Oral BID  . rosuvastatin  20 mg Oral Daily  . sertraline  100 mg Oral Daily   Continuous Infusions: . sodium chloride    . sodium chloride 100 mL/hr at 12/01/19 1517  . magnesium sulfate bolus IVPB       LOS: 6 days   Alma Friendly, MD [facility] Pager On Amion  If 7PM-7AM, please contact night-coverage 12/01/2019, 5:28 PM

## 2019-12-02 ENCOUNTER — Ambulatory Visit (HOSPITAL_COMMUNITY): Payer: Medicare Other

## 2019-12-02 ENCOUNTER — Inpatient Hospital Stay (HOSPITAL_COMMUNITY): Payer: Medicare Other

## 2019-12-02 ENCOUNTER — Ambulatory Visit: Payer: Medicare Other

## 2019-12-02 LAB — CBC WITH DIFFERENTIAL/PLATELET
Abs Immature Granulocytes: 0.16 10*3/uL — ABNORMAL HIGH (ref 0.00–0.07)
Basophils Absolute: 0 10*3/uL (ref 0.0–0.1)
Basophils Relative: 0 %
Eosinophils Absolute: 0.1 10*3/uL (ref 0.0–0.5)
Eosinophils Relative: 1 %
HCT: 27.8 % — ABNORMAL LOW (ref 39.0–52.0)
Hemoglobin: 9 g/dL — ABNORMAL LOW (ref 13.0–17.0)
Immature Granulocytes: 1 %
Lymphocytes Relative: 14 %
Lymphs Abs: 1.8 10*3/uL (ref 0.7–4.0)
MCH: 31.9 pg (ref 26.0–34.0)
MCHC: 32.4 g/dL (ref 30.0–36.0)
MCV: 98.6 fL (ref 80.0–100.0)
Monocytes Absolute: 1.3 10*3/uL — ABNORMAL HIGH (ref 0.1–1.0)
Monocytes Relative: 10 %
Neutro Abs: 8.9 10*3/uL — ABNORMAL HIGH (ref 1.7–7.7)
Neutrophils Relative %: 74 %
Platelets: 221 10*3/uL (ref 150–400)
RBC: 2.82 MIL/uL — ABNORMAL LOW (ref 4.22–5.81)
RDW: 13.7 % (ref 11.5–15.5)
WBC: 12.3 10*3/uL — ABNORMAL HIGH (ref 4.0–10.5)
nRBC: 0 % (ref 0.0–0.2)

## 2019-12-02 LAB — BASIC METABOLIC PANEL
Anion gap: 12 (ref 5–15)
BUN: 20 mg/dL (ref 8–23)
CO2: 17 mmol/L — ABNORMAL LOW (ref 22–32)
Calcium: 8.5 mg/dL — ABNORMAL LOW (ref 8.9–10.3)
Chloride: 100 mmol/L (ref 98–111)
Creatinine, Ser: 1.17 mg/dL (ref 0.61–1.24)
GFR calc Af Amer: >60 mL/min (ref >60)
GFR calc non Af Amer: >60 mL/min (ref >60)
Glucose, Bld: 143 mg/dL — ABNORMAL HIGH (ref 70–99)
Potassium: 4.3 mmol/L (ref 3.5–5.1)
Sodium: 129 mmol/L — ABNORMAL LOW (ref 135–145)

## 2019-12-02 LAB — GLUCOSE, CAPILLARY
Glucose-Capillary: 108 mg/dL — ABNORMAL HIGH (ref 70–99)
Glucose-Capillary: 164 mg/dL — ABNORMAL HIGH (ref 70–99)
Glucose-Capillary: 191 mg/dL — ABNORMAL HIGH (ref 70–99)
Glucose-Capillary: 182 mg/dL — ABNORMAL HIGH (ref 70–99)

## 2019-12-02 MED ORDER — SODIUM BICARBONATE 650 MG PO TABS
650.0000 mg | ORAL_TABLET | Freq: Two times a day (BID) | ORAL | Status: AC
  Administered 2019-12-02 – 2019-12-03 (×4): 650 mg via ORAL
  Filled 2019-12-02 (×4): qty 1

## 2019-12-02 NOTE — Progress Notes (Addendum)
[facility] [place]  Subjective  - doing a little better each day.   Objective 115/63 66 98.6 F (37 C) (Oral) 19 99%  Intake/Output Summary (Last 24 hours) at 12/02/2019 0725 Last data filed at 12/02/2019 2956 Gross per 24 hour  Intake 3444.77 ml  Output --  Net 3444.77 ml    Doppler signals left LE DP/PT, GT toe nail avulsion  Left LE incisions healing well, groin soft  Lungs non labored breathing    Assessment/Planning: POD # 3 69 y.o. male is s/p:  1.Left common femoral endarterectomy 2.Left common femoral to peroneal artery bypass with ipsilateral, nonreversed, translocated greater saphenous vein 3.Harvest left greater saphenous vein   Patent bypass with doppler signals intact.  GT nail avulsion taped on.   Encourage mobility  Roxy Horseman 12/02/2019 7:25 AM --  Laboratory Lab Results: Recent Labs    12/01/19 0254 12/02/19 0423  WBC 11.7* 12.3*  HGB 9.0* 9.0*  HCT 26.8* 27.8*  PLT 207 221   BMET Recent Labs    12/01/19 0254 12/02/19 0423  NA 128* 129*  K 4.5 4.3  CL 99 100  CO2 19* 17*  GLUCOSE 217* 143*  BUN 20 20  CREATININE 1.25* 1.17  CALCIUM 8.4* 8.5*    COAG Lab Results  Component Value Date   INR 1.1 11/28/2019   INR 1.01 10/23/2016   INR 0.94 03/21/2015   No results found for: PTT  I have independently interviewed and examined patient and agree with PA assessment and plan above.   Jaiel Saraceno C. Donzetta Matters, MD [facility] [place] Office: [phone] Pager: [phone]

## 2019-12-02 NOTE — Progress Notes (Signed)
Mobility Specialist - Progress Note   12/02/19 1525  Mobility  Activity Refused mobility  Mobility performed by Mobility specialist   Pt refused mobility due to feeling fatigued from PT earlier. He expressed he was willing to walk later this evening if he is able to with his nurse.   [place] Specialist

## 2019-12-02 NOTE — Progress Notes (Addendum)
PROGRESS NOTE    Jason [name]  XTK:[id number] DOB: 07-Jan-1951 DOA: 11/25/2019 PCP: Horald Pollen, MD    Brief Narrative:  69 y.o. male with medical history significant for PVD s/p R fem-pop bypass, COPD, CAD s/p CABG, T2DM, HTN, HLD who presents with worsening bilateral lower extremity pain for about 1 month, worse with ambulation and better at rest.  Also noted left foot heel wound and also an ulcer on the plantar surface of the right foot. Patient continues to be a current smoker of at least 1 pack/day. In the ED, intermittently hypotensive with improvement with IV fluids.  Lab work notable for elevated creatinine 1.29 from a prior of 1.18.  Sodium of 129.  Mild leukocytosis of 10.9. No signs of necrosis on feet but patient did not have palpable or doppler findings of posterior tibial or dorsalis pedis pulse on the left side. Right foot x-ray was negative for osteomyelitis but had incidental finding of fourth toe fracture. ABI in the ED showed left lower extremity of 0.68 and right foot 0.81.  Last left ABI in February 2021 was 0.24. ED physician Dr. Rex Kras discussed case with vascular surgeon Dr. Oneida Alar    Assessment & Plan:   Principal Problem:   Critical lower limb ischemia Active Problems:   DM (diabetes mellitus) ([place])   CAD, CABG Feb 2011, cath x 4 since-medical Rx   PVD, hx Rt femoral endarterectomy 2004   Tobacco abuse   Hyponatremia   Hypotension   Hypothyroidism   Wound of lower extremity   Toe fracture, right   Bilateral lower extremity claudication/Left ischemic limb with history of PVD ABI in the ED with left lower extremity of 0.68 and right foot 0.81.  Last left ABI in February 2021 was 0.24.  No palpable or Doppler findings of PT or DP pulses on left side Vascular on board, underwent US guided cannulation of L common femoral, aortogram with B LE runoff, angioplasty of R common femoral and stenting to the L external iliac on 6/7 and L common femoral  endarterectomy with L common fem to peroneal artery bypass for critical LLE ischemia Patient is continued on aspirin and Plavix  Leukocytosis ?recative, afebrile, no signs of infection CXR without acute infiltrate Daily cbc  Left great toe nail, heel, lateral wound/ Right plantar ulcer wound at base of 4th MTP Wound care was consulted  Right 4th toe fracture Cont with rigid sole shoes for ambulation  Hypertension Initially hypotensive, currently stable Continued on metoprolol with hold parameters  AKI/metabolic acidosis Cr down trended, still acidotic D/C IVF PO bicarb X 2 days Daily BMP  Chronic hyponatremia Ongoing for years Daily BMP  Normocytic anemia of chronic disease Hemoglobin at baseline Anemia panel pending Daily CBC  Tobacco abuse Continued on nicotine patch  Type 2 diabetes mellitus SSI, accuchecks, hypoglycemic protocol  History of CAD s/p CABG Continue Ranexa, statin  Hypothyroidism Continue levothyroxine     DVT prophylaxis: heparin subq Code Status: DNR Family Communication: Discussed extensively with patient  Status is: Inpatient  Remains inpatient appropriate because:Ongoing diagnostic testing needed not appropriate for outpatient work up   Dispo: The patient is from: Home              Anticipated d/c is to: Home, refusing SNF (although PT recommended)              Anticipated d/c date is: 2 days              Patient currently  is not medically stable to d/c., awaiting sign off from vascular. Pt still having mobility issues  Consultants:   Vascular Surgery  Procedures:   US guided cannulation of L common femoral, aortogram with B LE runoff, angioplasty of R common femoral and stenting to the L external iliac on 6/7   L common femoral endarterectomy with L common fem to peroneal artery bypass for critical LLE ischemia on 6/8  Antimicrobials: Anti-infectives (From admission, onward)   Start     Dose/Rate Route Frequency  Ordered Stop   11/29/19 1730  ceFAZolin (ANCEF) IVPB 2g/100 mL premix        2 g 200 mL/hr over 30 Minutes Intravenous Every 8 hours 11/29/19 1556 11/30/19 0109   11/29/19 0900  ceFAZolin (ANCEF) IVPB 2g/100 mL premix        2 g 200 mL/hr over 30 Minutes Intravenous 30 min pre-op 11/28/19 2108 11/29/19 1015      Subjective: Patient denies any new complaints   Objective: Vitals:   12/01/19 2358 12/02/19 0446 12/02/19 0845 12/02/19 1245  BP: 102/64 115/63 (!) 132/53 (!) 114/49  Pulse: 66  98 80  Resp: 19  20   Temp: 98.6 F (37 C) 98.6 F (37 C) 98 F (36.7 C) 98 F (36.7 C)  TempSrc: Oral Oral Oral Oral  SpO2: 99%  92% 100%  Weight:      Height:        Intake/Output Summary (Last 24 hours) at 12/02/2019 1448 Last data filed at 12/02/2019 0830 Gross per 24 hour  Intake 2964.77 ml  Output --  Net 2964.77 ml   Filed Weights   11/26/19 2113 11/28/19 2108 11/29/19 0644  Weight: 76.4 kg 73.2 kg 72.7 kg    Examination:  General: NAD   Cardiovascular: S1, S2 present  Respiratory: CTAB  Abdomen: Soft, nontender, nondistended, bowel sounds present  Musculoskeletal: No bilateral pedal edema noted  Skin: Normal  Psychiatry: Normal mood    Data Reviewed: I have personally reviewed following labs and imaging studies  CBC: Recent Labs  Lab 11/25/19 1810 11/26/19 0304 11/29/19 0402 11/29/19 1619 11/30/19 0432 12/01/19 0254 12/02/19 0423  WBC 10.9*   < > 7.5 12.7* 11.5* 11.7* 12.3*  NEUTROABS 8.3*  --   --   --   --  8.9* 8.9*  HGB 11.9*   < > 11.7* 10.7* 10.3* 9.0* 9.0*  HCT 34.7*   < > 35.3* 32.5* 31.3* 26.8* 27.8*  MCV 94.6   < > 95.9 96.7 97.2 95.7 98.6  PLT 228   < > 247 236 233 207 221   < > = values in this interval not displayed.   Basic Metabolic Panel: Recent Labs  Lab 11/28/19 2352 11/28/19 2352 11/29/19 0402 11/29/19 1619 11/30/19 0432 12/01/19 0254 12/02/19 0423  NA 131*  --  131*  --  129* 128* 129*  K 4.5  --  4.3  --  4.6 4.5 4.3   CL 100  --  100  --  99 99 100  CO2 22  --  22  --  20* 19* 17*  GLUCOSE 247*  --  163*  --  224* 217* 143*  BUN 13  --  15  --  16 20 20   CREATININE 1.33*   < > 1.24 1.21 1.19 1.25* 1.17  CALCIUM 9.1  --  9.1  --  8.6* 8.4* 8.5*   < > = values in this interval not displayed.   GFR: Estimated Creatinine Clearance:  61.3 mL/min (by C-G formula based on SCr of 1.17 mg/dL). Liver Function Tests: Recent Labs  Lab 11/27/19 0655 11/28/19 0655 11/28/19 2352 11/29/19 0402  AST 12* 16 17 16   ALT 14 17 18 17   ALKPHOS 75 83 80 74  BILITOT 0.4 0.7 0.3 0.6  PROT 7.3 7.3 6.6 6.4*  ALBUMIN 3.7 3.7 3.3* 3.2*   No results for input(s): LIPASE, AMYLASE in the last 168 hours. No results for input(s): AMMONIA in the last 168 hours. Coagulation Profile: Recent Labs  Lab 11/28/19 2352  INR 1.1   Cardiac Enzymes: No results for input(s): CKTOTAL, CKMB, CKMBINDEX, TROPONINI in the last 168 hours. BNP (last 3 results) No results for input(s): PROBNP in the last 8760 hours. HbA1C: No results for input(s): HGBA1C in the last 72 hours. CBG: Recent Labs  Lab 12/01/19 1141 12/01/19 1658 12/01/19 2134 12/02/19 0628 12/02/19 1246  GLUCAP 161* 159* 204* 108* 164*   Lipid Profile: Recent Labs    12/01/19 0254  CHOL 103  HDL 29*  LDLCALC 43  TRIG 156*  CHOLHDL 3.6   Thyroid Function Tests: No results for input(s): TSH, T4TOTAL, FREET4, T3FREE, THYROIDAB in the last 72 hours. Anemia Panel: No results for input(s): VITAMINB12, FOLATE, FERRITIN, TIBC, IRON, RETICCTPCT in the last 72 hours. Sepsis Labs: Recent Labs  Lab 11/25/19 1810 11/26/19 0304 11/26/19 0704  LATICACIDVEN 2.1* 1.4 1.1    Recent Results (from the past 240 hour(s))  SARS Coronavirus 2 by RT PCR (hospital order, performed in [facility] hospital lab) Nasopharyngeal Nasopharyngeal Swab     Status: None   Collection Time: 11/25/19  7:50 PM   Specimen: Nasopharyngeal Swab  Result Value Ref Range Status   SARS  Coronavirus 2 NEGATIVE NEGATIVE Final    Comment: (NOTE) SARS-CoV-2 target nucleic acids are NOT DETECTED. The SARS-CoV-2 RNA is generally detectable in upper and lower respiratory specimens during the acute phase of infection. The lowest concentration of SARS-CoV-2 viral copies this assay can detect is 250 copies / mL. A negative result does not preclude SARS-CoV-2 infection and should not be used as the sole basis for treatment or other patient management decisions.  A negative result may occur with improper specimen collection / handling, submission of specimen other than nasopharyngeal swab, presence of viral mutation(s) within the areas targeted by this assay, and inadequate number of viral copies (<250 copies / mL). A negative result must be combined with clinical observations, patient history, and epidemiological information. Fact Sheet for Patients:   StrictlyIdeas.no Fact Sheet for Healthcare Providers: BankingDealers.co.za This test is not yet approved or cleared  by the [place] FDA and has been authorized for detection and/or diagnosis of SARS-CoV-2 by FDA under an Emergency Use Authorization (EUA).  This EUA will remain in effect (meaning this test can be used) for the duration of the COVID-19 declaration under Section 564(b)(1) of the Act, 21 U.S.C. section 360bbb-3(b)(1), unless the authorization is terminated or revoked sooner. Performed at [facility], [address] [place], [place] [address]   Surgical pcr screen     Status: Abnormal   Collection Time: 11/28/19 11:12 PM   Specimen: Nasal Mucosa; Nasal Swab  Result Value Ref Range Status   MRSA, PCR NEGATIVE NEGATIVE Final   Staphylococcus aureus POSITIVE (A) NEGATIVE Final    Comment: CRITICAL RESULT CALLED TO, READ BACK BY AND VERIFIED WITH: Jola Schmidt [id number] @0139  THANEY Performed at [facility] [address], [place], [place]  [address]      Radiology Studies: [facility] Chest Port 1 View  Result Date: 12/02/2019 CLINICAL DATA:  Leukocytosis EXAM: PORTABLE CHEST 1 VIEW COMPARISON:  Portable exam 0753 hours compared to 05/27/2019 FINDINGS: Normal heart size post CABG and coronary stenting. Mediastinal contours and pulmonary vascularity normal. Atherosclerotic calcification aorta. Minimal LEFT basilar atelectasis. Lungs otherwise clear. No pulmonary infiltrate, pleural effusion, or pneumothorax. Probable RIGHT nipple shadow unchanged since 07/25/2009 Bones demineralized. IMPRESSION: Post CABG and coronary stenting. LEFT basilar atelectasis without acute infiltrate. Electronically Signed   By: Lavonia Dana M.D.   On: 12/02/2019 08:30    Scheduled Meds: . aspirin EC  81 mg Oral Daily  . Chlorhexidine Gluconate Cloth  6 each Topical Daily  . clopidogrel  75 mg Oral Daily  . gabapentin  300 mg Oral BID  . heparin  5,000 Units Subcutaneous Q8H  . insulin aspart  0-15 Units Subcutaneous TID WC  . insulin aspart  0-5 Units Subcutaneous QHS  . levothyroxine  50 mcg Oral Daily  . metoprolol tartrate  12.5 mg Oral BID  . mupirocin ointment  1 application Nasal BID  . nicotine  21 mg Transdermal Daily  . pantoprazole  40 mg Oral Daily  . ranolazine  1,000 mg Oral BID  . rosuvastatin  20 mg Oral Daily  . sertraline  100 mg Oral Daily  . sodium bicarbonate  650 mg Oral BID   Continuous Infusions: . magnesium sulfate bolus IVPB       LOS: 7 days   Alma Friendly, MD [facility] Pager On Amion  If 7PM-7AM, please contact night-coverage 12/02/2019, 2:48 PM

## 2019-12-02 NOTE — Progress Notes (Signed)
Physical Therapy Treatment Patient Details Name: Jason [name] MRN: [id number] DOB: 11-23-1950 Today's Date: 12/02/2019    History of Present Illness Jason [name] is a 69 y.o. male with medical history significant for PVD s/p R fem-pop bypass, COPD, CAD s/p CABG, T2DM, HTN, HLD who presents with worsening bilateral lower extremity pain. Pt underwent L CFAl endarterectomy and L common femoral to peroneal artery bypass.    PT Comments    Pt still requiring assist with mobility and is not steady on his feet. In further questioning pt reports his brother goes to work at 6:00 AM and will not be there during the day. I told the pt I was concerned with this plan and asked if he would reconsider [facility]NF. He reports he had been at SNF previously and he was not happy with it especially the food. I asked him to reconsider.   Follow Up Recommendations  SNF;Supervision/Assistance - 24 hour (Pt refusing SNF so will need max HH services.)     Equipment Recommendations  None recommended by PT    Recommendations for Other Services       Precautions / Restrictions Precautions Precautions: Other (comment) Precaution Comments: pt with noted R 4th toe fx however no plan Restrictions Other Position/Activity Restrictions: per chart pt with incidental finding R 4th toe fx, no treatment plan or post op shoe ordered    Mobility  Bed Mobility               General bed mobility comments: Pt up in chair  Transfers Overall transfer level: Needs assistance Equipment used: Rolling walker (2 wheeled) Transfers: Sit to/from Stand Sit to Stand: Min assist         General transfer comment: Assist for balance coming up from low chair.   Ambulation/Gait Ambulation/Gait assistance: Min assist Gait Distance (Feet): 140 Feet Assistive device: Rolling walker (2 wheeled) Gait Pattern/deviations: Step-through pattern;Wide base of support;Decreased step length - right;Decreased step length -  left;Shuffle;Trunk flexed Gait velocity: decr Gait velocity interpretation: <1.8 ft/sec, indicate of risk for recurrent falls General Gait Details: Assist for balance and cues to stay closer to walker   Stairs             Wheelchair Mobility    Modified Rankin (Stroke Patients Only)       Balance Overall balance assessment: Needs assistance Sitting-balance support: No upper extremity supported;Feet supported Sitting balance-Leahy Scale: Fair     Standing balance support: Bilateral upper extremity supported Standing balance-Leahy Scale: Poor Standing balance comment: walker and min guard assist for static standing                            Cognition Arousal/Alertness: Awake/alert Behavior During Therapy: WFL for tasks assessed/performed Overall Cognitive Status: Within Functional Limits for tasks assessed                                        Exercises      General Comments        Pertinent Vitals/Pain Pain Assessment: Faces Faces Pain Scale: Hurts even more Pain Location: LLE Pain Descriptors / Indicators: Sore Pain Intervention(s): Monitored during session;Repositioned    Home Living                      Prior Function  PT Goals (current goals can now be found in the care plan section) Acute Rehab PT Goals Patient Stated Goal: home Progress towards PT goals: Progressing toward goals    Frequency    Min 3X/week      PT Plan Current plan remains appropriate    Co-evaluation              AM-PAC PT "6 Clicks" Mobility   Outcome Measure  Help needed turning from your back to your side while in a flat bed without using bedrails?: None Help needed moving from lying on your back to sitting on the side of a flat bed without using bedrails?: A Little Help needed moving to and from a bed to a chair (including a wheelchair)?: A Little Help needed standing up from a chair using your arms (e.g.,  wheelchair or bedside chair)?: A Little Help needed to walk in hospital room?: A Little Help needed climbing 3-5 steps with a railing? : A Lot 6 Click Score: 18    End of Session Equipment Utilized During Treatment: Gait belt Activity Tolerance: Patient limited by fatigue Patient left: in chair;with call bell/phone within reach;with chair alarm set Nurse Communication: Mobility status PT Visit Diagnosis: Unsteadiness on feet (R26.81);Difficulty in walking, not elsewhere classified (R26.2)     Time: 1245-1310 PT Time Calculation (min) (ACUTE ONLY): 25 min  Charges:  $Gait Training: 23-37 mins                     [place] Pager [phone] Office [place] 12/02/2019, 2:30 PM

## 2019-12-02 NOTE — Discharge Instructions (Signed)
 [facility]  Discharge instructions  Lower Extremity Bypass Surgery  Please refer to the following instruction for your post-procedure care. Your surgeon or physician assistant will discuss any changes with you.  Activity  You are encouraged to walk as much as you can. You can slowly return to normal activities during the month after your surgery. Avoid strenuous activity and heavy lifting until your doctor tells you it's OK. Avoid activities such as vacuuming or swinging a golf club. Do not drive until your doctor give the OK and you are no longer taking prescription pain medications. It is also normal to have difficulty with sleep habits, eating and bowel movement after surgery. These will go away with time.  Bathing/Showering  You may shower after you go home. Do not soak in a bathtub, hot tub, or swim until the incision heals completely.  Incision Care  Clean your incision with mild soap and water. Shower every day. Pat the area dry with a clean towel. You do not need a bandage unless otherwise instructed. Do not apply any ointments or creams to your incision. If you have open wounds you will be instructed how to care for them or a visiting nurse may be arranged for you. If you have staples or sutures along your incision they will be removed at your post-op appointment. You may have skin glue on your incision. Do not peel it off. It will come off on its own in about one week. If you have a great deal of moisture in your groin, use a gauze help keep this area dry.  Diet  Resume your normal diet. There are no special food restrictions following this procedure. A low fat/ low cholesterol diet is recommended for all patients with vascular disease. In order to heal from your surgery, it is CRITICAL to get adequate nutrition. Your body requires vitamins, minerals, and protein. Vegetables are the best source of vitamins and minerals. Vegetables also provide the  perfect balance of protein. Processed food has little nutritional value, so try to avoid this.  Medications  Resume taking all your medications unless your doctor or nurse practitioner tells you not to. If your incision is causing pain, you may take over-the-counter pain relievers such as acetaminophen (Tylenol). If you were prescribed a stronger pain medication, please aware these medication can cause nausea and constipation. Prevent nausea by taking the medication with a snack or meal. Avoid constipation by drinking plenty of fluids and eating foods with high amount of fiber, such as fruits, vegetables, and grains. Take Colase 100 mg (an over-the-counter stool softener) twice a day as needed for constipation. Do not take Tylenol if you are taking prescription pain medications.  Follow Up  Our office will schedule a follow up appointment 2-3 weeks following discharge.  Please call us immediately for any of the following conditions  Severe or worsening pain in your legs or feet while at rest or while walking Increase pain, redness, warmth, or drainage (pus) from your incision site(s) Fever of 101 degree or higher The swelling in your leg with the bypass suddenly worsens and becomes more painful than when you were in the hospital If you have been instructed to feel your graft pulse then you should do so every day. If you can no longer feel this pulse, call the office immediately. Not all patients are given this instruction.  Leg swelling is common after leg bypass surgery.  The swelling should improve over a few months   following surgery. To improve the swelling, you may elevate your legs above the level of your heart while you are sitting or resting. Your surgeon or physician assistant may ask you to apply an ACE wrap or wear compression (TED) stockings to help to reduce swelling.  Reduce your risk of vascular disease  Stop smoking. If you would like help call [facility] at 1-800-QUIT-NOW  (1-800-784-8669) or [place] at 336-586-4000.  Manage your cholesterol Maintain a desired weight Control your diabetes weight Control your diabetes Keep your blood pressure down  If you have any questions, please call the office at [phone]   

## 2019-12-02 NOTE — Care Management Important Message (Signed)
Important Message  Patient Details  Name: Jason [name] MRN: [id number] Date of Birth: [birth year]   Medicare Important Message Given:  Yes     Shelda Altes 12/02/2019, 9:49 AM

## 2019-12-03 LAB — CBC WITH DIFFERENTIAL/PLATELET
Abs Immature Granulocytes: 0.07 10*3/uL (ref 0.00–0.07)
Basophils Absolute: 0 10*3/uL (ref 0.0–0.1)
Basophils Relative: 0 %
Eosinophils Absolute: 0.2 10*3/uL (ref 0.0–0.5)
Eosinophils Relative: 2 %
HCT: 27.2 % — ABNORMAL LOW (ref 39.0–52.0)
Hemoglobin: 8.9 g/dL — ABNORMAL LOW (ref 13.0–17.0)
Immature Granulocytes: 1 %
Lymphocytes Relative: 16 %
Lymphs Abs: 1.6 10*3/uL (ref 0.7–4.0)
MCH: 31.8 pg (ref 26.0–34.0)
MCHC: 32.7 g/dL (ref 30.0–36.0)
MCV: 97.1 fL (ref 80.0–100.0)
Monocytes Absolute: 1 10*3/uL (ref 0.1–1.0)
Monocytes Relative: 9 %
Neutro Abs: 7.3 10*3/uL (ref 1.7–7.7)
Neutrophils Relative %: 72 %
Platelets: 239 10*3/uL (ref 150–400)
RBC: 2.8 MIL/uL — ABNORMAL LOW (ref 4.22–5.81)
RDW: 13.4 % (ref 11.5–15.5)
WBC: 10.1 10*3/uL (ref 4.0–10.5)
nRBC: 0 % (ref 0.0–0.2)

## 2019-12-03 LAB — BASIC METABOLIC PANEL
Anion gap: 10 (ref 5–15)
BUN: 19 mg/dL (ref 8–23)
CO2: 22 mmol/L (ref 22–32)
Calcium: 8.6 mg/dL — ABNORMAL LOW (ref 8.9–10.3)
Chloride: 98 mmol/L (ref 98–111)
Creatinine, Ser: 1.19 mg/dL (ref 0.61–1.24)
GFR calc Af Amer: >60 mL/min (ref >60)
GFR calc non Af Amer: >60 mL/min (ref >60)
Glucose, Bld: 166 mg/dL — ABNORMAL HIGH (ref 70–99)
Potassium: 4.8 mmol/L (ref 3.5–5.1)
Sodium: 130 mmol/L — ABNORMAL LOW (ref 135–145)

## 2019-12-03 LAB — VITAMIN B12: Vitamin B-12: 137 pg/mL — ABNORMAL LOW (ref 180–914)

## 2019-12-03 LAB — FOLATE: Folate: 7.2 ng/mL (ref >5.9)

## 2019-12-03 LAB — GLUCOSE, CAPILLARY
Glucose-Capillary: 134 mg/dL — ABNORMAL HIGH (ref 70–99)
Glucose-Capillary: 225 mg/dL — ABNORMAL HIGH (ref 70–99)
Glucose-Capillary: 113 mg/dL — ABNORMAL HIGH (ref 70–99)
Glucose-Capillary: 155 mg/dL — ABNORMAL HIGH (ref 70–99)

## 2019-12-03 LAB — TYPE AND SCREEN
ABO/RH(D): O POS
Antibody Screen: NEGATIVE

## 2019-12-03 LAB — IRON AND TIBC
Iron: 35 ug/dL — ABNORMAL LOW (ref 45–182)
Saturation Ratios: 10 % — ABNORMAL LOW (ref 17.9–39.5)
TIBC: 336 ug/dL (ref 250–450)
UIBC: 301 ug/dL

## 2019-12-03 LAB — FERRITIN: Ferritin: 167 ng/mL (ref 24–336)

## 2019-12-03 MED ORDER — SODIUM CHLORIDE 0.9 % IV SOLN
510.0000 mg | Freq: Once | INTRAVENOUS | Status: AC
  Administered 2019-12-03: 510 mg via INTRAVENOUS
  Filled 2019-12-03: qty 17

## 2019-12-03 MED ORDER — VITAMIN B-12 1000 MCG PO TABS
1000.0000 ug | ORAL_TABLET | Freq: Every day | ORAL | Status: DC
  Administered 2019-12-04 – 2019-12-08 (×5): 1000 ug via ORAL
  Filled 2019-12-03 (×5): qty 1

## 2019-12-03 MED ORDER — FERROUS SULFATE 325 (65 FE) MG PO TABS
325.0000 mg | ORAL_TABLET | Freq: Every day | ORAL | Status: DC
  Administered 2019-12-03 – 2019-12-08 (×6): 325 mg via ORAL
  Filled 2019-12-03 (×6): qty 1

## 2019-12-03 MED ORDER — CYANOCOBALAMIN 1000 MCG/ML IJ SOLN
1000.0000 ug | Freq: Once | INTRAMUSCULAR | Status: AC
  Administered 2019-12-03: 1000 ug via SUBCUTANEOUS
  Filled 2019-12-03: qty 1

## 2019-12-03 NOTE — Progress Notes (Addendum)
[facility]  Subjective  - Doing well over all, a little better each day.   Objective (!) 126/59 70 98 F (36.7 C) (Oral) 16 98%  Intake/Output Summary (Last 24 hours) at 12/03/2019 0809 Last data filed at 12/03/2019 0600 Gross per 24 hour  Intake 690 ml  Output 600 ml  Net 90 ml    Doppler DP/PT/peroneal, GT avulsion of nail stable for now will watch Incisions healing well groin soft Lungs non labored breathing  Assessment/Planning: POD # 4 70 y.o.maleis s/p:  1.Left common femoral endarterectomy 2.Left common femoral to peroneal artery bypass with ipsilateral, nonreversed, translocated greater saphenous vein 3.Harvest left greater saphenous vein  He lives alone and wants to be more independent to go home verses SNF Stable with patent bypass left LE  Roxy Horseman 12/03/2019 8:09 AM --  Laboratory Lab Results: Recent Labs    12/02/19 0423 12/03/19 0338  WBC 12.3* 10.1  HGB 9.0* 8.9*  HCT 27.8* 27.2*  PLT 221 239   BMET Recent Labs    12/02/19 0423 12/03/19 0338  NA 129* 130*  K 4.3 4.8  CL 100 98  CO2 17* 22  GLUCOSE 143* 166*  BUN 20 19  CREATININE 1.17 1.19  CALCIUM 8.5* 8.6*    COAG Lab Results  Component Value Date   INR 1.1 11/28/2019   INR 1.01 10/23/2016   INR 0.94 03/21/2015   No results found for: PTT   I have seen and evaluated the patient. I agree with the PA note as documented above.  Postop day 4 status post left common femoral to peroneal bypass with saphenous vein.  Incisions look good.  He has a very brisk peroneal signal at the left ankle.  Appears PT is recommending SNF and he states he lives alone.  I think he could be transitioned to discharge if he has a bed available at the SNF but appears to be refusing that.  Marty Heck, MD [facility] Office: [phone]

## 2019-12-03 NOTE — Progress Notes (Signed)
Mobility Specialist - Progress Note   12/03/19 1252  Mobility  Activity Ambulated in hall  Level of Assistance Modified independent, requires aide device or extra time  Assistive Device Front wheel walker  Distance Ambulated (ft) 160 ft  Mobility Response Tolerated well  Mobility performed by Mobility specialist  $Mobility charge 1 Mobility    Pre-mobility: 65 HR, 118/54 BP, 100% SpO2 Post-mobility: 76 HR, 133/54 BP, 100% SPO2  Pt needed me to hold the RW steady while he stood, he didn't require any other assistance. He c/o R calf cramping that he rated a 5/10 while walking.   [place] Specialist

## 2019-12-03 NOTE — Progress Notes (Signed)
PROGRESS NOTE    Jason [name]  ZDG:[id number] DOB: 14-Aug-1950 DOA: 11/25/2019 PCP: Horald Pollen, MD    Brief Narrative:  69 y.o. male with medical history significant for PVD s/p R fem-pop bypass, COPD, CAD s/p CABG, T2DM, HTN, HLD who presents with worsening bilateral lower extremity pain for about 1 month, worse with ambulation and better at rest.  Also noted left foot heel wound and also an ulcer on the plantar surface of the right foot. Patient continues to be a current smoker of at least 1 pack/day. In the ED, intermittently hypotensive with improvement with IV fluids.  Lab work notable for elevated creatinine 1.29 from a prior of 1.18.  Sodium of 129.  Mild leukocytosis of 10.9. No signs of necrosis on feet but patient did not have palpable or doppler findings of posterior tibial or dorsalis pedis pulse on the left side. Right foot x-ray was negative for osteomyelitis but had incidental finding of fourth toe fracture. ABI in the ED showed left lower extremity of 0.68 and right foot 0.81.  Last left ABI in February 2021 was 0.24. ED physician Dr. Rex Kras discussed case with vascular surgeon Dr. Oneida Alar    Assessment & Plan:   Principal Problem:   Critical lower limb ischemia Active Problems:   DM (diabetes mellitus) ([place])   CAD, CABG Feb 2011, cath x 4 since-medical Rx   PVD, hx Rt femoral endarterectomy 2004   Tobacco abuse   Hyponatremia   Hypotension   Hypothyroidism   Wound of lower extremity   Toe fracture, right   Bilateral lower extremity claudication/Left ischemic limb with history of PVD ABI in the ED with left lower extremity of 0.68 and right foot 0.81.  Last left ABI in February 2021 was 0.24.  No palpable or Doppler findings of PT or DP pulses on left side Vascular on board, underwent US guided cannulation of L common femoral, aortogram with B LE runoff, angioplasty of R common femoral and stenting to the L external iliac on 6/7 and L common femoral  endarterectomy with L common fem to peroneal artery bypass for critical LLE ischemia Patient is continued on aspirin and Plavix  Leukocytosis Resolved ?recative, afebrile, no signs of infection CXR without acute infiltrate Daily cbc  Left great toe nail, heel, lateral wound/ Right plantar ulcer wound at base of 4th MTP Wound care was consulted  Right 4th toe fracture Cont with rigid sole shoes for ambulation  Hypertension Initially hypotensive, currently stable Continued on metoprolol with hold parameters  AKI/metabolic acidosis Cr down trended, acidosis improving D/C IVF PO bicarb X 2 days Daily BMP  Chronic hyponatremia Ongoing for years Daily BMP  Iron def/Vit B12 def/anemia of chronic disease Hemoglobin slowing trending down Anemia panel showed iron 35, sats 10, Vit B12 137 S/P 1 dose of feraheme, Virgie cyanocobalamin on 12/03/19, continue oral supplements Daily CBC  Tobacco abuse Continued on nicotine patch  Type 2 diabetes mellitus SSI, accuchecks, hypoglycemic protocol  History of CAD s/p CABG Continue Ranexa, statin  Hypothyroidism Continue levothyroxine     DVT prophylaxis: heparin subq Code Status: DNR Family Communication: Discussed extensively with patient  Status is: Inpatient  Remains inpatient appropriate because:Ongoing diagnostic testing needed not appropriate for outpatient work up   Dispo: The patient is from: Home              Anticipated d/c is to: Home, refusing SNF (although PT recommended)  Anticipated d/c date is: 2 days              Patient currently is not medically stable to d/c., awaiting sign off from vascular. Pt still having mobility issues  Consultants:   Vascular Surgery  Procedures:   US guided cannulation of L common femoral, aortogram with B LE runoff, angioplasty of R common femoral and stenting to the L external iliac on 6/7   L common femoral endarterectomy with L common fem to peroneal  artery bypass for critical LLE ischemia on 6/8  Antimicrobials: Anti-infectives (From admission, onward)   Start     Dose/Rate Route Frequency Ordered Stop   11/29/19 1730  ceFAZolin (ANCEF) IVPB 2g/100 mL premix        2 g 200 mL/hr over 30 Minutes Intravenous Every 8 hours 11/29/19 1556 11/30/19 0109   11/29/19 0900  ceFAZolin (ANCEF) IVPB 2g/100 mL premix        2 g 200 mL/hr over 30 Minutes Intravenous 30 min pre-op 11/28/19 2108 11/29/19 1015      Subjective: Patient reports feeling tired, otherwise denies any new complaints     Objective: Vitals:   12/03/19 0335 12/03/19 0838 12/03/19 1059 12/03/19 1607  BP: (!) 126/59 (!) 99/55 (!) 118/55 (!) 111/49  Pulse: 70 69 71   Resp: 16 15 18 19   Temp: 98 F (36.7 C) 97.6 F (36.4 C) 97.8 F (36.6 C) 97.6 F (36.4 C)  TempSrc: Oral Oral Oral Oral  SpO2: 98% 100% 100% 100%  Weight:      Height:        Intake/Output Summary (Last 24 hours) at 12/03/2019 1719 Last data filed at 12/03/2019 0600 Gross per 24 hour  Intake 450 ml  Output --  Net 450 ml   Filed Weights   11/26/19 2113 11/28/19 2108 11/29/19 0644  Weight: 76.4 kg 73.2 kg 72.7 kg    Examination:  General: NAD   Cardiovascular: S1, S2 present  Respiratory: CTAB  Abdomen: Soft, nontender, nondistended, bowel sounds present  Musculoskeletal: No bilateral pedal edema noted  Skin: Normal  Psychiatry: Normal mood    Data Reviewed: I have personally reviewed following labs and imaging studies  CBC: Recent Labs  Lab 11/29/19 1619 11/30/19 0432 12/01/19 0254 12/02/19 0423 12/03/19 0338  WBC 12.7* 11.5* 11.7* 12.3* 10.1  NEUTROABS  --   --  8.9* 8.9* 7.3  HGB 10.7* 10.3* 9.0* 9.0* 8.9*  HCT 32.5* 31.3* 26.8* 27.8* 27.2*  MCV 96.7 97.2 95.7 98.6 97.1  PLT 236 233 207 221 098   Basic Metabolic Panel: Recent Labs  Lab 11/29/19 0402 11/29/19 0402 11/29/19 1619 11/30/19 0432 12/01/19 0254 12/02/19 0423 12/03/19 0338  NA 131*  --   --   129* 128* 129* 130*  K 4.3  --   --  4.6 4.5 4.3 4.8  CL 100  --   --  99 99 100 98  CO2 22  --   --  20* 19* 17* 22  GLUCOSE 163*  --   --  224* 217* 143* 166*  BUN 15  --   --  16 20 20 19   CREATININE 1.24   < > 1.21 1.19 1.25* 1.17 1.19  CALCIUM 9.1  --   --  8.6* 8.4* 8.5* 8.6*   < > = values in this interval not displayed.   GFR: Estimated Creatinine Clearance: 60.2 mL/min (by C-G formula based on SCr of 1.19 mg/dL). Liver Function Tests: Recent Labs  Lab 11/27/19 0655 11/28/19 0655 11/28/19 2352 11/29/19 0402  AST 12* 16 17 16   ALT 14 17 18 17   ALKPHOS 75 83 80 74  BILITOT 0.4 0.7 0.3 0.6  PROT 7.3 7.3 6.6 6.4*  ALBUMIN 3.7 3.7 3.3* 3.2*   No results for input(s): LIPASE, AMYLASE in the last 168 hours. No results for input(s): AMMONIA in the last 168 hours. Coagulation Profile: Recent Labs  Lab 11/28/19 2352  INR 1.1   Cardiac Enzymes: No results for input(s): CKTOTAL, CKMB, CKMBINDEX, TROPONINI in the last 168 hours. BNP (last 3 results) No results for input(s): PROBNP in the last 8760 hours. HbA1C: No results for input(s): HGBA1C in the last 72 hours. CBG: Recent Labs  Lab 12/02/19 1803 12/02/19 2122 12/03/19 0614 12/03/19 1057 12/03/19 1605  GLUCAP 191* 182* 134* 225* 113*   Lipid Profile: Recent Labs    12/01/19 0254  CHOL 103  HDL 29*  LDLCALC 43  TRIG 156*  CHOLHDL 3.6   Thyroid Function Tests: No results for input(s): TSH, T4TOTAL, FREET4, T3FREE, THYROIDAB in the last 72 hours. Anemia Panel: Recent Labs    12/03/19 0338  VITAMINB12 137*  FOLATE 7.2  FERRITIN 167  TIBC 336  IRON 35*   Sepsis Labs: No results for input(s): PROCALCITON, LATICACIDVEN in the last 168 hours.  Recent Results (from the past 240 hour(s))  SARS Coronavirus 2 by RT PCR (hospital order, performed in [facility] hospital lab) Nasopharyngeal Nasopharyngeal Swab     Status: None   Collection Time: 11/25/19  7:50 PM   Specimen: Nasopharyngeal Swab  Result  Value Ref Range Status   SARS Coronavirus 2 NEGATIVE NEGATIVE Final    Comment: (NOTE) SARS-CoV-2 target nucleic acids are NOT DETECTED. The SARS-CoV-2 RNA is generally detectable in upper and lower respiratory specimens during the acute phase of infection. The lowest concentration of SARS-CoV-2 viral copies this assay can detect is 250 copies / mL. A negative result does not preclude SARS-CoV-2 infection and should not be used as the sole basis for treatment or other patient management decisions.  A negative result may occur with improper specimen collection / handling, submission of specimen other than nasopharyngeal swab, presence of viral mutation(s) within the areas targeted by this assay, and inadequate number of viral copies (<250 copies / mL). A negative result must be combined with clinical observations, patient history, and epidemiological information. Fact Sheet for Patients:   StrictlyIdeas.no Fact Sheet for Healthcare Providers: BankingDealers.co.za This test is not yet approved or cleared  by the [place] FDA and has been authorized for detection and/or diagnosis of SARS-CoV-2 by FDA under an Emergency Use Authorization (EUA).  This EUA will remain in effect (meaning this test can be used) for the duration of the COVID-19 declaration under Section 564(b)(1) of the Act, 21 U.S.C. section 360bbb-3(b)(1), unless the authorization is terminated or revoked sooner. Performed at [facility], [address], [place], [place] [address]   Surgical pcr screen     Status: Abnormal   Collection Time: 11/28/19 11:12 PM   Specimen: Nasal Mucosa; Nasal Swab  Result Value Ref Range Status   MRSA, PCR NEGATIVE NEGATIVE Final   Staphylococcus aureus POSITIVE (A) NEGATIVE Final    Comment: CRITICAL RESULT CALLED TO, READ BACK BY AND VERIFIED WITH: Jola Schmidt [id number] @0139  THANEY Performed at [facility] [address], [place], [place] [address]      Radiology Studies: [facility] Chest [place] [address]  Result Date: 12/02/2019 CLINICAL DATA:  Leukocytosis EXAM: PORTABLE CHEST 1 VIEW COMPARISON:  Portable exam 0753 hours compared to 05/27/2019 FINDINGS: Normal heart size post CABG and coronary stenting. Mediastinal contours and pulmonary vascularity normal. Atherosclerotic calcification aorta. Minimal LEFT basilar atelectasis. Lungs otherwise clear. No pulmonary infiltrate, pleural effusion, or pneumothorax. Probable RIGHT nipple shadow unchanged since 07/25/2009 Bones demineralized. IMPRESSION: Post CABG and coronary stenting. LEFT basilar atelectasis without acute infiltrate. Electronically Signed   By: Lavonia Dana M.D.   On: 12/02/2019 08:30    Scheduled Meds: . aspirin EC  81 mg Oral Daily  . Chlorhexidine Gluconate Cloth  6 each Topical Daily  . clopidogrel  75 mg Oral Daily  . ferrous sulfate  325 mg Oral Q breakfast  . gabapentin  300 mg Oral BID  . heparin  5,000 Units Subcutaneous Q8H  . insulin aspart  0-15 Units Subcutaneous TID WC  . insulin aspart  0-5 Units Subcutaneous QHS  . levothyroxine  50 mcg Oral Daily  . metoprolol tartrate  12.5 mg Oral BID  . mupirocin ointment  1 application Nasal BID  . nicotine  21 mg Transdermal Daily  . pantoprazole  40 mg Oral Daily  . ranolazine  1,000 mg Oral BID  . rosuvastatin  20 mg Oral Daily  . sertraline  100 mg Oral Daily  . sodium bicarbonate  650 mg Oral BID  . [START ON 12/04/2019] vitamin B-12  1,000 mcg Oral Daily   Continuous Infusions: . magnesium sulfate bolus IVPB       LOS: 8 days   Alma Friendly, MD [facility] Pager On Amion  If 7PM-7AM, please contact night-coverage 12/03/2019, 5:19 PM

## 2019-12-04 DIAGNOSIS — L8990 Pressure ulcer of unspecified site, unspecified stage: Secondary | ICD-10-CM | POA: Insufficient documentation

## 2019-12-04 LAB — BASIC METABOLIC PANEL
Anion gap: 7 (ref 5–15)
BUN: 16 mg/dL (ref 8–23)
CO2: 23 mmol/L (ref 22–32)
Calcium: 8.7 mg/dL — ABNORMAL LOW (ref 8.9–10.3)
Chloride: 97 mmol/L — ABNORMAL LOW (ref 98–111)
Creatinine, Ser: 1.17 mg/dL (ref 0.61–1.24)
GFR calc Af Amer: >60 mL/min (ref >60)
GFR calc non Af Amer: >60 mL/min (ref >60)
Glucose, Bld: 152 mg/dL — ABNORMAL HIGH (ref 70–99)
Potassium: 4.7 mmol/L (ref 3.5–5.1)
Sodium: 127 mmol/L — ABNORMAL LOW (ref 135–145)

## 2019-12-04 LAB — CBC WITH DIFFERENTIAL/PLATELET
Abs Immature Granulocytes: 0.11 10*3/uL — ABNORMAL HIGH (ref 0.00–0.07)
Basophils Absolute: 0 10*3/uL (ref 0.0–0.1)
Basophils Relative: 0 %
Eosinophils Absolute: 0.2 10*3/uL (ref 0.0–0.5)
Eosinophils Relative: 2 %
HCT: 26.5 % — ABNORMAL LOW (ref 39.0–52.0)
Hemoglobin: 8.8 g/dL — ABNORMAL LOW (ref 13.0–17.0)
Immature Granulocytes: 1 %
Lymphocytes Relative: 19 %
Lymphs Abs: 1.6 10*3/uL (ref 0.7–4.0)
MCH: 31.8 pg (ref 26.0–34.0)
MCHC: 33.2 g/dL (ref 30.0–36.0)
MCV: 95.7 fL (ref 80.0–100.0)
Monocytes Absolute: 0.8 10*3/uL (ref 0.1–1.0)
Monocytes Relative: 10 %
Neutro Abs: 5.6 10*3/uL (ref 1.7–7.7)
Neutrophils Relative %: 68 %
Platelets: 223 10*3/uL (ref 150–400)
RBC: 2.77 MIL/uL — ABNORMAL LOW (ref 4.22–5.81)
RDW: 13.2 % (ref 11.5–15.5)
WBC: 8.3 10*3/uL (ref 4.0–10.5)
nRBC: 0 % (ref 0.0–0.2)

## 2019-12-04 LAB — GLUCOSE, CAPILLARY
Glucose-Capillary: 145 mg/dL — ABNORMAL HIGH (ref 70–99)
Glucose-Capillary: 248 mg/dL — ABNORMAL HIGH (ref 70–99)
Glucose-Capillary: 194 mg/dL — ABNORMAL HIGH (ref 70–99)
Glucose-Capillary: 203 mg/dL — ABNORMAL HIGH (ref 70–99)

## 2019-12-04 NOTE — Progress Notes (Signed)
Mobility Specialist - Progress Note   12/04/19 1250  Mobility  Activity Ambulated in hall  Level of Assistance Modified independent, requires aide device or extra time  Assistive Device Front wheel walker  Distance Ambulated (ft) 240 ft  Mobility Response Tolerated well  Mobility performed by Mobility specialist  $Mobility charge 1 Mobility    Pre-mobility: 67 HR, 113/72 BP, 97% SpO2 Post-mobility: 63 HR, 106/76 BP, 97% SpO2  Pt c/o bilateral aching pain in both legs while ambulating that he rated a 7/10.   [place] Specialist

## 2019-12-04 NOTE — Progress Notes (Addendum)
[facility]   Subjective  - Mobility and pain with mobility improving slowly.   Objective (!) 92/53 (!) 59 97.7 F (36.5 C) (Oral) 20 99%  Intake/Output Summary (Last 24 hours) at 12/04/2019 0833 Last data filed at 12/04/2019 6387 Gross per 24 hour  Intake 480 ml  Output 800 ml  Net -320 ml   Doppler DP/PT/peroneal, GT avulsion of nail stable for now will watch.  Dry dressing on toe. Incisions healing well groin soft Lungs non labored breathing   Assessment/Planning: POD # 5 1.Left common femoral endarterectomy 2.Left common femoral to peroneal artery bypass with ipsilateral, nonreversed, translocated greater saphenous vein 3.Harvest left greater saphenous vein  He lives alone and wants to be more independent to go home verses SNF Stable with patent bypass left LE. Stable from a vascular point of view for discharge  Roxy Horseman 12/04/2019 8:33 AM --  Laboratory Lab Results: Recent Labs    12/03/19 0338 12/04/19 0517  WBC 10.1 8.3  HGB 8.9* 8.8*  HCT 27.2* 26.5*  PLT 239 223   BMET Recent Labs    12/03/19 0338 12/04/19 0517  NA 130* 127*  K 4.8 4.7  CL 98 97*  CO2 22 23  GLUCOSE 166* 152*  BUN 19 16  CREATININE 1.19 1.17  CALCIUM 8.6* 8.7*    COAG Lab Results  Component Value Date   INR 1.1 11/28/2019   INR 1.01 10/23/2016   INR 0.94 03/21/2015   No results found for: PTT  I have seen and evaluated the patient. I agree with the PA note as documented above.  Status post left fem peroneal bypass.  Has very brisk peroneal signal in the left foot.  All of incisions look good.  Initially was refusing SNF but has had some issues with mobility and lives alone.  Now he states he is amendable potentially to a facility for a short period of time.  Marty Heck, MD [facility] [place] Office: [phone]

## 2019-12-04 NOTE — Progress Notes (Signed)
PROGRESS NOTE    Jason [name]  DHR:[id number] DOB: Nov 07, 1950 DOA: 11/25/2019 PCP: Horald Pollen, MD    Brief Narrative:  69 y.o. male with medical history significant for PVD s/p R fem-pop bypass, COPD, CAD s/p CABG, T2DM, HTN, HLD who presents with worsening bilateral lower extremity pain for about 1 month, worse with ambulation and better at rest.  Also noted left foot heel wound and also an ulcer on the plantar surface of the right foot. Patient continues to be a current smoker of at least 1 pack/day. In the ED, intermittently hypotensive with improvement with IV fluids.  Lab work notable for elevated creatinine 1.29 from a prior of 1.18.  Sodium of 129.  Mild leukocytosis of 10.9. No signs of necrosis on feet but patient did not have palpable or doppler findings of posterior tibial or dorsalis pedis pulse on the left side. Right foot x-ray was negative for osteomyelitis but had incidental finding of fourth toe fracture. ABI in the ED showed left lower extremity of 0.68 and right foot 0.81.  Last left ABI in February 2021 was 0.24. ED physician Dr. Rex Kras discussed case with vascular surgeon Dr. Oneida Alar    Assessment & Plan:   Principal Problem:   Critical lower limb ischemia Active Problems:   DM (diabetes mellitus) ([place])   CAD, CABG Feb 2011, cath x 4 since-medical Rx   PVD, hx Rt femoral endarterectomy 2004   Tobacco abuse   Hyponatremia   Hypotension   Hypothyroidism   Wound of lower extremity   Toe fracture, right   Pressure injury of skin   Bilateral lower extremity claudication/Left ischemic limb with history of PVD ABI in the ED with left lower extremity of 0.68 and right foot 0.81.  Last left ABI in February 2021 was 0.24.  No palpable or Doppler findings of PT or DP pulses on left side Vascular on board, underwent US guided cannulation of L common femoral, aortogram with B LE runoff, angioplasty of R common femoral and stenting to the L external iliac on 6/7  and L common femoral endarterectomy with L common fem to peroneal artery bypass for critical LLE ischemia Patient is continued on aspirin and Plavix  Leukocytosis Resolved ?recative, afebrile, no signs of infection CXR without acute infiltrate Daily cbc  Left great toe nail, heel, lateral wound/ Right plantar ulcer wound at base of 4th MTP Wound care was consulted  Right 4th toe fracture Cont with rigid sole shoes for ambulation  Hypertension Initially hypotensive, currently stable Continued on metoprolol with hold parameters  AKI/metabolic acidosis Cr down trended, acidosis resolved D/C IVF S/p PO bicarb X 2 days Daily BMP  Chronic hyponatremia Ongoing for years Daily BMP  Iron def/Vit B12 def/anemia of chronic disease Hemoglobin slowing trending down Anemia panel showed iron 35, sats 10, Vit B12 137 S/P 1 dose of feraheme, Ubly cyanocobalamin on 12/03/19, continue oral supplements Daily CBC  Tobacco abuse Continued on nicotine patch  Type 2 diabetes mellitus SSI, accuchecks, hypoglycemic protocol  History of CAD s/p CABG Continue Ranexa, statin  Hypothyroidism Continue levothyroxine     DVT prophylaxis: heparin subq Code Status: DNR Family Communication: Discussed extensively with patient  Status is: Inpatient  Remains inpatient appropriate because:Ongoing diagnostic testing needed not appropriate for outpatient work up   Dispo: The patient is from: Home              Anticipated d/c is to: SNF (pt now agreeable for SNF)  Anticipated d/c date is: 1 day              Patient currently is not medically stable to d/c., awaiting sign off from vascular. Pt still having mobility issues  Consultants:   Vascular Surgery  Procedures:   US guided cannulation of L common femoral, aortogram with B LE runoff, angioplasty of R common femoral and stenting to the L external iliac on 6/7   L common femoral endarterectomy with L common fem to  peroneal artery bypass for critical LLE ischemia on 6/8  Antimicrobials: Anti-infectives (From admission, onward)   Start     Dose/Rate Route Frequency Ordered Stop   11/29/19 1730  ceFAZolin (ANCEF) IVPB 2g/100 mL premix        2 g 200 mL/hr over 30 Minutes Intravenous Every 8 hours 11/29/19 1556 11/30/19 0109   11/29/19 0900  ceFAZolin (ANCEF) IVPB 2g/100 mL premix        2 g 200 mL/hr over 30 Minutes Intravenous 30 min pre-op 11/28/19 2108 11/29/19 1015      Subjective: Patient still with mobility issues, pain in calf area upon ambulation. Denies any other new complaints      Objective: Vitals:   12/03/19 2357 12/04/19 0401 12/04/19 0900 12/04/19 1100  BP: 106/68 (!) 92/53 (!) 112/50 (!) 113/48  Pulse: (!) 57 (!) 59 66 67  Resp: 17 20 14 17   Temp: 97.7 F (36.5 C) 97.7 F (36.5 C) 97.6 F (36.4 C) 97.8 F (36.6 C)  TempSrc: Oral Oral Oral Oral  SpO2: 96% 99% 100% 100%  Weight:      Height:        Intake/Output Summary (Last 24 hours) at 12/04/2019 1532 Last data filed at 12/04/2019 7169 Gross per 24 hour  Intake 480 ml  Output 800 ml  Net -320 ml   Filed Weights   11/26/19 2113 11/28/19 2108 11/29/19 0644  Weight: 76.4 kg 73.2 kg 72.7 kg    Examination:  General: NAD   Cardiovascular: S1, S2 present  Respiratory: CTAB  Abdomen: Soft, nontender, nondistended, bowel sounds present  Musculoskeletal: No bilateral pedal edema noted  Skin: Normal  Psychiatry: Normal mood    Data Reviewed: I have personally reviewed following labs and imaging studies  CBC: Recent Labs  Lab 11/30/19 0432 12/01/19 0254 12/02/19 0423 12/03/19 0338 12/04/19 0517  WBC 11.5* 11.7* 12.3* 10.1 8.3  NEUTROABS  --  8.9* 8.9* 7.3 5.6  HGB 10.3* 9.0* 9.0* 8.9* 8.8*  HCT 31.3* 26.8* 27.8* 27.2* 26.5*  MCV 97.2 95.7 98.6 97.1 95.7  PLT 233 207 221 239 678   Basic Metabolic Panel: Recent Labs  Lab 11/30/19 0432 12/01/19 0254 12/02/19 0423 12/03/19 0338 12/04/19 0517   NA 129* 128* 129* 130* 127*  K 4.6 4.5 4.3 4.8 4.7  CL 99 99 100 98 97*  CO2 20* 19* 17* 22 23  GLUCOSE 224* 217* 143* 166* 152*  BUN 16 20 20 19 16   CREATININE 1.19 1.25* 1.17 1.19 1.17  CALCIUM 8.6* 8.4* 8.5* 8.6* 8.7*   GFR: Estimated Creatinine Clearance: 61.3 mL/min (by C-G formula based on SCr of 1.17 mg/dL). Liver Function Tests: Recent Labs  Lab 11/28/19 0655 11/28/19 2352 11/29/19 0402  AST 16 17 16   ALT 17 18 17   ALKPHOS 83 80 74  BILITOT 0.7 0.3 0.6  PROT 7.3 6.6 6.4*  ALBUMIN 3.7 3.3* 3.2*   No results for input(s): LIPASE, AMYLASE in the last 168 hours. No results for input(s):  AMMONIA in the last 168 hours. Coagulation Profile: Recent Labs  Lab 11/28/19 2352  INR 1.1   Cardiac Enzymes: No results for input(s): CKTOTAL, CKMB, CKMBINDEX, TROPONINI in the last 168 hours. BNP (last 3 results) No results for input(s): PROBNP in the last 8760 hours. HbA1C: No results for input(s): HGBA1C in the last 72 hours. CBG: Recent Labs  Lab 12/03/19 1057 12/03/19 1605 12/03/19 2110 12/04/19 0601 12/04/19 1059  GLUCAP 225* 113* 155* 145* 248*   Lipid Profile: No results for input(s): CHOL, HDL, LDLCALC, TRIG, CHOLHDL, LDLDIRECT in the last 72 hours. Thyroid Function Tests: No results for input(s): TSH, T4TOTAL, FREET4, T3FREE, THYROIDAB in the last 72 hours. Anemia Panel: Recent Labs    12/03/19 0338  VITAMINB12 137*  FOLATE 7.2  FERRITIN 167  TIBC 336  IRON 35*   Sepsis Labs: No results for input(s): PROCALCITON, LATICACIDVEN in the last 168 hours.  Recent Results (from the past 240 hour(s))  SARS Coronavirus 2 by RT PCR (hospital order, performed in [facility] hospital lab) Nasopharyngeal Nasopharyngeal Swab     Status: None   Collection Time: 11/25/19  7:50 PM   Specimen: Nasopharyngeal Swab  Result Value Ref Range Status   SARS Coronavirus 2 NEGATIVE NEGATIVE Final    Comment: (NOTE) SARS-CoV-2 target nucleic acids are NOT DETECTED. The  SARS-CoV-2 RNA is generally detectable in upper and lower respiratory specimens during the acute phase of infection. The lowest concentration of SARS-CoV-2 viral copies this assay can detect is 250 copies / mL. A negative result does not preclude SARS-CoV-2 infection and should not be used as the sole basis for treatment or other patient management decisions.  A negative result may occur with improper specimen collection / handling, submission of specimen other than nasopharyngeal swab, presence of viral mutation(s) within the areas targeted by this assay, and inadequate number of viral copies (<250 copies / mL). A negative result must be combined with clinical observations, patient history, and epidemiological information. Fact Sheet for Patients:   StrictlyIdeas.no Fact Sheet for Healthcare Providers: BankingDealers.co.za This test is not yet approved or cleared  by the [place] FDA and has been authorized for detection and/or diagnosis of SARS-CoV-2 by FDA under an Emergency Use Authorization (EUA).  This EUA will remain in effect (meaning this test can be used) for the duration of the COVID-19 declaration under Section 564(b)(1) of the Act, 21 U.S.C. section 360bbb-3(b)(1), unless the authorization is terminated or revoked sooner. Performed at [facility], [address] [place], [place] [address]   Surgical pcr screen     Status: Abnormal   Collection Time: 11/28/19 11:12 PM   Specimen: Nasal Mucosa; Nasal Swab  Result Value Ref Range Status   MRSA, PCR NEGATIVE NEGATIVE Final   Staphylococcus aureus POSITIVE (A) NEGATIVE Final    Comment: CRITICAL RESULT CALLED TO, READ BACK BY AND VERIFIED WITH: Jola Schmidt [phone] @0139  THANEY Performed at [place] [address] [place], [place] [address]      Radiology Studies: No results found.  Scheduled Meds: . aspirin EC  81 mg Oral Daily  .  Chlorhexidine Gluconate Cloth  6 each Topical Daily  . clopidogrel  75 mg Oral Daily  . ferrous sulfate  325 mg Oral Q breakfast  . gabapentin  300 mg Oral BID  . heparin  5,000 Units Subcutaneous Q8H  . insulin aspart  0-15 Units Subcutaneous TID WC  . insulin aspart  0-5 Units Subcutaneous  QHS  . levothyroxine  50 mcg Oral Daily  . metoprolol tartrate  12.5 mg Oral BID  . nicotine  21 mg Transdermal Daily  . pantoprazole  40 mg Oral Daily  . ranolazine  1,000 mg Oral BID  . rosuvastatin  20 mg Oral Daily  . sertraline  100 mg Oral Daily  . vitamin B-12  1,000 mcg Oral Daily   Continuous Infusions: . magnesium sulfate bolus IVPB       LOS: 9 days   Alma Friendly, MD [facility] Pager On Amion  If 7PM-7AM, please contact night-coverage 12/04/2019, 3:32 PM

## 2019-12-05 ENCOUNTER — Inpatient Hospital Stay (HOSPITAL_COMMUNITY): Payer: Medicare Other

## 2019-12-05 DIAGNOSIS — Z9889 Other specified postprocedural states: Secondary | ICD-10-CM

## 2019-12-05 LAB — GLUCOSE, CAPILLARY
Glucose-Capillary: 137 mg/dL — ABNORMAL HIGH (ref 70–99)
Glucose-Capillary: 168 mg/dL — ABNORMAL HIGH (ref 70–99)
Glucose-Capillary: 115 mg/dL — ABNORMAL HIGH (ref 70–99)
Glucose-Capillary: 219 mg/dL — ABNORMAL HIGH (ref 70–99)

## 2019-12-05 LAB — BASIC METABOLIC PANEL
Anion gap: 10 (ref 5–15)
BUN: 11 mg/dL (ref 8–23)
CO2: 20 mmol/L — ABNORMAL LOW (ref 22–32)
Calcium: 8.6 mg/dL — ABNORMAL LOW (ref 8.9–10.3)
Chloride: 100 mmol/L (ref 98–111)
Creatinine, Ser: 1.05 mg/dL (ref 0.61–1.24)
GFR calc Af Amer: >60 mL/min (ref >60)
GFR calc non Af Amer: >60 mL/min (ref >60)
Glucose, Bld: 129 mg/dL — ABNORMAL HIGH (ref 70–99)
Potassium: 4.4 mmol/L (ref 3.5–5.1)
Sodium: 130 mmol/L — ABNORMAL LOW (ref 135–145)

## 2019-12-05 MED ORDER — SODIUM BICARBONATE 650 MG PO TABS
650.0000 mg | ORAL_TABLET | Freq: Two times a day (BID) | ORAL | Status: AC
  Administered 2019-12-05 (×2): 650 mg via ORAL
  Filled 2019-12-05 (×2): qty 1

## 2019-12-05 NOTE — Progress Notes (Signed)
ABI study completed.  ° °See Cv Proc for preliminary results.  ° °Jason [name] ° °

## 2019-12-05 NOTE — Progress Notes (Addendum)
[facility] [place]  Subjective  - No new complaints.   Objective (!) 116/47 66 97.6 F (36.4 C) (Oral) 20 96%  Intake/Output Summary (Last 24 hours) at 12/05/2019 0728 Last data filed at 12/04/2019 1916 Gross per 24 hour  Intake 1520 ml  Output 300 ml  Net 1220 ml    Left doppler brisk DP/peroneal Incisions healing well Lungs non labored breathing  Assessment/Planning: POD # 6 1.Left common femoral endarterectomy 2.Left common femoral to peroneal artery bypass with ipsilateral, nonreversed, translocated greater saphenous vein 3.Harvest left greater saphenous vein  Patent bypass Pending PT today and mobility home verse SNF F/U in our office in 2 weeks  Roxy Horseman 12/05/2019 7:28 AM --  Laboratory Lab Results: Recent Labs    12/03/19 0338 12/04/19 0517  WBC 10.1 8.3  HGB 8.9* 8.8*  HCT 27.2* 26.5*  PLT 239 223   BMET Recent Labs    12/04/19 0517 12/05/19 0322  NA 127* 130*  K 4.7 4.4  CL 97* 100  CO2 23 20*  GLUCOSE 152* 129*  BUN 16 11  CREATININE 1.17 1.05  CALCIUM 8.7* 8.6*    COAG Lab Results  Component Value Date   INR 1.1 11/28/2019   INR 1.01 10/23/2016   INR 0.94 03/21/2015   No results found for: PTT  I have independently interviewed and examined patient and agree with PA assessment and plan above.  Strong left peroneal artery signal and incisions are clean dry intact.  He is okay for discharge from vascular surgery standpoint when disposition has been confirmed.  Jonny Longino C. Donzetta Matters, MD [facility] [place] Office: [phone] Pager: [phone]

## 2019-12-05 NOTE — NC FL2 (Signed)
[place] LEVEL OF CARE SCREENING TOOL     IDENTIFICATION  Patient Name: Jason [name] Birthdate: 04-Nov-1950 Sex: male Admission Date (Current Location): 11/25/2019  [facility] [place] Number:  Herbalist and Address:  [facility] [place], [place] [address] [place], [place] [address]      Provider Number: [id number]  Attending Physician Name and Address:  Alma Friendly, MD  Relative Name and Phone Number:       Current Level of Care: Hospital Recommended Level of Care: [place] Prior Approval Number:    Date Approved/Denied:   PASRR Number: [id number]  Discharge Plan: SNF    Current Diagnoses: Patient Active Problem List   Diagnosis Date Noted  . Pressure injury of skin 12/04/2019  . Critical lower limb ischemia 11/26/2019  . Wound of lower extremity 11/26/2019  . Toe fracture, right 11/26/2019  . Amputation of right great toe ([place]) 07/11/2019  . Nicotine abuse 06/12/2019  . Cellulitis of right foot 06/11/2019  . Diabetic foot infection ([place]) 06/11/2019  . PAD (peripheral artery disease) ([place]) 06/11/2019  . Cellulitis 05/27/2019  . Gangrene of toe ([place]) 05/27/2019  . Status post lumbar spinal fusion 10/27/2017  . Acute on chronic combined systolic and diastolic CHF (congestive heart failure) ([place]) 10/25/2016  . NSTEMI (non-ST elevated myocardial infarction) ([place]) 10/22/2016  . Hypothyroidism 08/17/2015  . RBBB 05/11/2015  . Hyponatremia 04/04/2015  . Hypotension 04/04/2015  . Hemispheric carotid artery syndrome   . Carotid artery narrowing 03/28/2015  . Carotid stenosis- moderate 2011, 95% 2016 s/p stent 02/21/2014  . Unstable angina ([place]) 01/16/2014  . Tobacco abuse 01/16/2014  . Substance abuse in remission ([place]) 10/01/2012  . Radiculitis 02/10/2012  . CAD, CABG Feb 2011, cath x 4 since-medical Rx 10/03/2011  . PVD, hx Rt femoral endarterectomy 2004 10/03/2011  . DM (diabetes mellitus) ([place])  10/02/2011  . HTN (hypertension) 10/02/2011  . Hyperlipidemia 10/02/2011    Orientation RESPIRATION BLADDER Height & Weight     Self, Time, Situation, Place  Normal Continent Weight: 160 lb 4.4 oz (72.7 kg) Height:  5\' 9"  (175.3 cm)  BEHAVIORAL SYMPTOMS/MOOD NEUROLOGICAL BOWEL NUTRITION STATUS      Continent Diet (please see discharge summary)  AMBULATORY STATUS COMMUNICATION OF NEEDS Skin   Supervision Verbally Surgical wounds (pressure injury,buttocks,right,left,midstage, closed incison left leg,closed incision left thigh, liquid skin adhesive, closed incision left foot, diabetic ulcer heel, foam left dressing)                       Personal Care Assistance Level of Assistance  Bathing, Feeding, Dressing Bathing Assistance: Limited assistance Feeding assistance: Independent Dressing Assistance: Limited assistance     Functional Limitations Info  Sight, Speech, Hearing Sight Info: Adequate Hearing Info: Adequate Speech Info: Adequate    SPECIAL CARE FACTORS FREQUENCY  PT (By licensed PT), OT (By licensed OT)     PT Frequency: 5x per week OT Frequency: 5x per week            Contractures Contractures Info: Not present    Additional Factors Info  Code Status, Allergies Code Status Info: DNR Allergies Info: Coreg,Sulfa Antibiotics           Current Medications (12/05/2019):  This is the current hospital active medication list Current Facility-Administered Medications  Medication Dose Route Frequency Provider Last Rate Last Admin  . alum & mag hydroxide-simeth (MAALOX/MYLANTA) 200-200-20 MG/5ML suspension 15-30 mL  15-30 mL Oral  Q2H PRN Dagoberto Ligas, PA-C      . aspirin EC tablet 81 mg  81 mg Oral Daily Dagoberto Ligas, PA-C   81 mg at 12/05/19 0816  . bisacodyl (DULCOLAX) EC tablet 5 mg  5 mg Oral Daily PRN Waynetta Sandy, MD   5 mg at 12/04/19 2112  . clopidogrel (PLAVIX) tablet 75 mg  75 mg Oral Daily Dagoberto Ligas, PA-C   75 mg at  12/05/19 0815  . ferrous sulfate tablet 325 mg  325 mg Oral Q breakfast Alma Friendly, MD   325 mg at 12/05/19 0816  . gabapentin (NEURONTIN) capsule 300 mg  300 mg Oral BID Dagoberto Ligas, PA-C   300 mg at 12/05/19 0815  . guaiFENesin-dextromethorphan (ROBITUSSIN DM) 100-10 MG/5ML syrup 15 mL  15 mL Oral Q4H PRN Dagoberto Ligas, PA-C      . heparin injection 5,000 Units  5,000 Units Subcutaneous Q8H Dagoberto Ligas, PA-C   5,000 Units at 12/05/19 0530  . hydrALAZINE (APRESOLINE) injection 5 mg  5 mg Intravenous Q20 Min PRN Dagoberto Ligas, PA-C      . insulin aspart (novoLOG) injection 0-15 Units  0-15 Units Subcutaneous TID WC Alma Friendly, MD   2 Units at 12/05/19 [phone]  . insulin aspart (novoLOG) injection 0-5 Units  0-5 Units Subcutaneous QHS Alma Friendly, MD   2 Units at 12/04/19 2138  . levothyroxine (SYNTHROID) tablet 50 mcg  50 mcg Oral Daily Dagoberto Ligas, PA-C   50 mcg at 12/05/19 0816  . magnesium sulfate IVPB 2 g 50 mL  2 g Intravenous Daily PRN Dagoberto Ligas, PA-C      . metoprolol tartrate (LOPRESSOR) tablet 12.5 mg  12.5 mg Oral BID Dagoberto Ligas, PA-C   12.5 mg at 12/05/19 0815  . nicotine (NICODERM CQ - dosed in mg/24 hours) patch 21 mg  21 mg Transdermal Daily Dagoberto Ligas, PA-C   21 mg at 12/05/19 0817  . ondansetron (ZOFRAN) injection 4 mg  4 mg Intravenous Q6H PRN Dagoberto Ligas, PA-C      . oxyCODONE (Oxy IR/ROXICODONE) immediate release tablet 5-10 mg  5-10 mg Oral Q4H PRN Dagoberto Ligas, PA-C   10 mg at 12/04/19 1914  . pantoprazole (PROTONIX) EC tablet 40 mg  40 mg Oral Daily Dagoberto Ligas, PA-C   40 mg at 12/05/19 0819  . phenol (CHLORASEPTIC) mouth spray 1 spray  1 spray Mouth/Throat PRN Dagoberto Ligas, PA-C      . potassium chloride SA (KLOR-CON) CR tablet 20-40 mEq  20-40 mEq Oral Daily PRN Dagoberto Ligas, PA-C      . ranolazine (RANEXA) 12 hr tablet 1,000 mg  1,000 mg Oral BID Dagoberto Ligas, PA-C   1,000 mg at  12/05/19 0815  . rosuvastatin (CRESTOR) tablet 20 mg  20 mg Oral Daily Dagoberto Ligas, PA-C   20 mg at 12/05/19 0815  . sertraline (ZOLOFT) tablet 100 mg  100 mg Oral Daily Dagoberto Ligas, PA-C   100 mg at 12/05/19 0816  . sodium bicarbonate tablet 650 mg  650 mg Oral BID Alma Friendly, MD   650 mg at 12/05/19 0816  . vitamin B-12 (CYANOCOBALAMIN) tablet 1,000 mcg  1,000 mcg Oral Daily Alma Friendly, MD   1,000 mcg at 12/05/19 6789     Discharge Medications: Please see discharge summary for a list of discharge medications.  Relevant Imaging Results:  Relevant Lab Results:   Additional Information [id number]  Vinie Sill, LCSWA

## 2019-12-05 NOTE — Progress Notes (Signed)
Mobility Specialist - Progress Note   12/05/19 1732  Mobility  Activity Refused mobility  Mobility performed by Mobility specialist   [place] Mobility Specialist

## 2019-12-05 NOTE — Progress Notes (Signed)
Occupational Therapy Treatment Patient Details Name: Jason [name] MRN: [id number] DOB: July 23, 1950 Today's Date: 12/05/2019    History of present illness Jason [name] is a 69 y.o. male with medical history significant for PVD s/p R fem-pop bypass, COPD, CAD s/p CABG, T2DM, HTN, HLD who presents with worsening bilateral lower extremity pain. Pt underwent L CFAl endarterectomy and L common femoral to peroneal artery bypass.   OT comments  Pt progressing towards OT goals, presents seated in recliner pleasant and willing to participate in therapy session. Pt tolerating room level mobility, seated and standing ADL at sink, and toileting during session with overall minA throughout. Pt requiring cues for safe use of RW with transitions. VSS throughout. Pt continues to remain unsteady on his feet moreso notable when fatigued. Given continued weakness and that pt typically lives alone feel ST rehab will be beneficial to progress pt's safety and independence with ADL and mobility. Pt seems to be more in agreement with rehab at this time. Will continue to follow while acutely admitted.    Follow Up Recommendations  SNF;Supervision/Assistance - 24 hour    Equipment Recommendations  None recommended by OT          Precautions / Restrictions Precautions Precautions: Other (comment) Precaution Comments: pt with noted R 4th toe fx however no plan Restrictions Other Position/Activity Restrictions: per chart pt with incidental finding R 4th toe fx, no treatment plan or post op shoe ordered       Mobility Bed Mobility               General bed mobility comments: Pt up in chair  Transfers Overall transfer level: Needs assistance Equipment used: Rolling walker (2 wheeled) Transfers: Sit to/from Stand Sit to Stand: Min assist         General transfer comment: minA from recliner, chair without armrests at sink, and from lower surface height of toilet     Balance Overall balance  assessment: Needs assistance Sitting-balance support: No upper extremity supported;Feet supported Sitting balance-Leahy Scale: Fair     Standing balance support: Bilateral upper extremity supported Standing balance-Leahy Scale: Poor Standing balance comment: reliant on external assist                            ADL either performed or assessed with clinical judgement   ADL Overall ADL's : Needs assistance/impaired     Grooming: Wash/dry hands;Wash/dry face;Brushing hair;Minimal assistance;Standing;Supervision/safety;Sitting Grooming Details (indicate cue type and reason): performing multiple ADL in standing and seated at sink, minA For balance during standing portion Upper Body Bathing: Set up;Supervision/ safety;Sitting   Lower Body Bathing: Minimal assistance;Sitting/lateral leans;Sit to/from stand Lower Body Bathing Details (indicate cue type and reason): assist for balance          Toilet Transfer: Minimal assistance;Ambulation;RW;Grab bars;Regular Museum/gallery exhibitions officer and Hygiene: Min guard;Supervision/safety;Sitting/lateral lean;Sit to/from stand Toileting - Water quality scientist Details (indicate cue type and reason):  pt performing pericare via lateral lean     Functional mobility during ADLs: Minimal assistance;Rolling walker                         Cognition Arousal/Alertness: Awake/alert Behavior During Therapy: WFL for tasks assessed/performed Overall Cognitive Status: Within Functional Limits for tasks assessed  Exercises     Shoulder Instructions       General Comments VSS, LLE incision with min drainage today    Pertinent Vitals/ Pain       Pain Assessment: Faces Faces Pain Scale: Hurts a little bit Pain Location: LLE and feet (R toe fx and L great toenail) Pain Descriptors / Indicators: Sore;Guarding Pain Intervention(s): Monitored during  session;Repositioned  Home Living                                          Prior Functioning/Environment              Frequency  Min 2X/week        Progress Toward Goals  OT Goals(current goals can now be found in the care plan section)  Progress towards OT goals: Progressing toward goals  Acute Rehab OT Goals Patient Stated Goal: home OT Goal Formulation: With patient Time For Goal Achievement: 12/14/19 Potential to Achieve Goals: Good ADL Goals Pt Will Perform Lower Body Bathing: with modified independence;sit to/from stand Pt Will Perform Lower Body Dressing: with modified independence;sit to/from stand Pt Will Perform Tub/Shower Transfer: Shower transfer;with modified independence;ambulating;3 in 1;rolling walker  Plan Discharge plan needs to be updated    Co-evaluation                 AM-PAC OT "6 Clicks" Daily Activity     Outcome Measure   Help from another person eating meals?: None Help from another person taking care of personal grooming?: A Little Help from another person toileting, which includes using toliet, bedpan, or urinal?: A Little Help from another person bathing (including washing, rinsing, drying)?: A Little Help from another person to put on and taking off regular upper body clothing?: A Little Help from another person to put on and taking off regular lower body clothing?: A Little 6 Click Score: 19    End of Session Equipment Utilized During Treatment: Gait belt;Rolling walker  OT Visit Diagnosis: Unsteadiness on feet (R26.81);Muscle weakness (generalized) (M62.81);Pain   Activity Tolerance Patient tolerated treatment well   Patient Left in chair;with call bell/phone within reach   Nurse Communication Mobility status        Time: 0998-3382 OT Time Calculation (min): 34 min  Charges: OT General Charges $OT Visit: 1 Visit OT Treatments $Self Care/Home Management : 23-37 mins  Lou Cal,  OT Acute Rehabilitation Services Pager [phone] Office [place] 12/05/2019, 5:06 PM

## 2019-12-05 NOTE — Progress Notes (Signed)
Physical Therapy Treatment Patient Details Name: Jason [name] MRN: [id number] DOB: 02-Aug-1950 Today's Date: 12/05/2019    History of Present Illness Jason [name] is a 69 y.o. male with medical history significant for PVD s/p R fem-pop bypass, COPD, CAD s/p CABG, T2DM, HTN, HLD who presents with worsening bilateral lower extremity pain. Pt underwent L CFAl endarterectomy and L common femoral to peroneal artery bypass.    PT Comments    Patient is making steady progress toward PT goals. Pt continues to present with generalized weakness, gait abnormalities, and requires assistance for safety with OOB mobility. Given pt's current mobility level continue to recommend SNF for further skilled PT services to maximize independence and safety with mobility prior to d/c home alone.    Pt is now agreeable to ST-SNF stay      Follow Up Recommendations  SNF;Supervision/Assistance - 24 hour     Equipment Recommendations  None recommended by PT    Recommendations for Other Services       Precautions / Restrictions Precautions Precautions: Other (comment) Precaution Comments: pt with noted R 4th toe fx however no plan Restrictions Other Position/Activity Restrictions: per chart pt with incidental finding R 4th toe fx, no treatment plan or post op shoe ordered    Mobility  Bed Mobility               General bed mobility comments: Pt up in chair  Transfers Overall transfer level: Needs assistance Equipment used: Rolling walker (2 wheeled) Transfers: Sit to/from Stand Sit to Stand: Min assist;Min guard         General transfer comment: min guard assist from recliner and min A from commode using grab bar (without 3 in 1 over toilet)  Ambulation/Gait Ambulation/Gait assistance: Min guard;Min assist Gait Distance (Feet): 120 Feet Assistive device: Rolling walker (2 wheeled) Gait Pattern/deviations: Step-through pattern;Wide base of support;Decreased step length -  right;Decreased step length - left;Shuffle;Trunk flexed Gait velocity: decr   General Gait Details: pt with shuffling gait and bilat LE ER; heavy reliance on bilat UE support for balance and pain; grossly min guard for gait with increased assistance just before needing to sit due to fatigue; cues for upright posture and attempting increased bilat step lengths    Stairs Stairs: Yes Stairs assistance: Min assist Stair Management: One rail Right;One rail Left;Step to pattern;Sideways Number of Stairs: 6 General stair comments: pt requires assist to steady and cues for sequencing and technique; less difficulty using L rail vs R rail (pt used to using R rail at home and wanted to try that first)   Wheelchair Mobility    Modified Rankin (Stroke Patients Only)       Balance Overall balance assessment: Needs assistance Sitting-balance support: No upper extremity supported;Feet supported Sitting balance-Leahy Scale: Fair     Standing balance support: Bilateral upper extremity supported Standing balance-Leahy Scale: Poor                 High Level Balance Comments: increased instability with stepping backwards but no overt LOB             Cognition Arousal/Alertness: Awake/alert Behavior During Therapy: WFL for tasks assessed/performed Overall Cognitive Status: Within Functional Limits for tasks assessed                                        Exercises      General  Comments        Pertinent Vitals/Pain Pain Assessment: Faces Faces Pain Scale: Hurts little more Pain Location: LLE and feet (R toe fx and L great toenail) Pain Descriptors / Indicators: Sore;Guarding Pain Intervention(s): Monitored during session;Limited activity within patient's tolerance;Repositioned    Home Living                      Prior Function            PT Goals (current goals can now be found in the care plan section) Acute Rehab PT Goals Patient Stated  Goal: home Progress towards PT goals: Progressing toward goals    Frequency    Min 3X/week      PT Plan Current plan remains appropriate    Co-evaluation              AM-PAC PT "6 Clicks" Mobility   Outcome Measure  Help needed turning from your back to your side while in a flat bed without using bedrails?: None Help needed moving from lying on your back to sitting on the side of a flat bed without using bedrails?: A Little Help needed moving to and from a bed to a chair (including a wheelchair)?: A Little Help needed standing up from a chair using your arms (e.g., wheelchair or bedside chair)?: A Little Help needed to walk in hospital room?: A Little Help needed climbing 3-5 steps with a railing? : A Little 6 Click Score: 19    End of Session Equipment Utilized During Treatment: Gait belt Activity Tolerance: Patient tolerated treatment well Patient left: in chair;with call bell/phone within reach Nurse Communication: Mobility status PT Visit Diagnosis: Unsteadiness on feet (R26.81);Difficulty in walking, not elsewhere classified (R26.2)     Time: 4268-3419 PT Time Calculation (min) (ACUTE ONLY): 40 min  Charges:  $Gait Training: 23-37 mins                     Jason [name], PTA Acute Rehabilitation Services Pager: [phone] Office: [phone]     Jason [name] 12/05/2019, 10:31 AM

## 2019-12-05 NOTE — Progress Notes (Signed)
PROGRESS NOTE    Jason [name]  OZD:[id number] DOB: 09-05-1950 DOA: 11/25/2019 PCP: Horald Pollen, MD    Brief Narrative:  69 y.o. male with medical history significant for PVD s/p R fem-pop bypass, COPD, CAD s/p CABG, T2DM, HTN, HLD who presents with worsening bilateral lower extremity pain for about 1 month, worse with ambulation and better at rest.  Also noted left foot heel wound and also an ulcer on the plantar surface of the right foot. Patient continues to be a current smoker of at least 1 pack/day. In the ED, intermittently hypotensive with improvement with IV fluids.  Lab work notable for elevated creatinine 1.29 from a prior of 1.18.  Sodium of 129.  Mild leukocytosis of 10.9. No signs of necrosis on feet but patient did not have palpable or doppler findings of posterior tibial or dorsalis pedis pulse on the left side. Right foot x-ray was negative for osteomyelitis but had incidental finding of fourth toe fracture. ABI in the ED showed left lower extremity of 0.68 and right foot 0.81.  Last left ABI in February 2021 was 0.24. ED physician Dr. Rex Kras discussed case with vascular surgeon Dr. Oneida Alar    Assessment & Plan:   Principal Problem:   Critical lower limb ischemia Active Problems:   DM (diabetes mellitus) ([place])   CAD, CABG Feb 2011, cath x 4 since-medical Rx   PVD, hx Rt femoral endarterectomy 2004   Tobacco abuse   Hyponatremia   Hypotension   Hypothyroidism   Wound of lower extremity   Toe fracture, right   Pressure injury of skin   Bilateral lower extremity claudication/Left ischemic limb with history of PVD ABI in the ED with left lower extremity of 0.68 and right foot 0.81.  Last left ABI in February 2021 was 0.24.  No palpable or Doppler findings of PT or DP pulses on left side Vascular on board, underwent US guided cannulation of L common femoral, aortogram with B LE runoff, angioplasty of R common femoral and stenting to the L external iliac on 6/7  and L common femoral endarterectomy with L common fem to peroneal artery bypass for critical LLE ischemia Patient is continued on aspirin and Plavix  Leukocytosis Resolved ?recative, afebrile, no signs of infection CXR without acute infiltrate Daily cbc  Left great toe nail, heel, lateral wound/ Right plantar ulcer wound at base of 4th MTP Wound care was consulted  Right 4th toe fracture Cont with rigid sole shoes for ambulation  Hypertension Initially hypotensive, currently stable Continued on metoprolol with hold parameters  AKI/metabolic acidosis Cr down trended, acidosis D/C IVF PO bicarb X 2 days Daily BMP  Chronic hyponatremia Ongoing for years Daily BMP  Iron def/Vit B12 def/anemia of chronic disease Hemoglobin slowing trending down Anemia panel showed iron 35, sats 10, Vit B12 137 S/P 1 dose of feraheme, Rafael Gonzalez cyanocobalamin on 12/03/19, continue oral supplements Daily CBC  Tobacco abuse Continued on nicotine patch  Type 2 diabetes mellitus SSI, accuchecks, hypoglycemic protocol  History of CAD s/p CABG Continue Ranexa, statin  Hypothyroidism Continue levothyroxine     DVT prophylaxis: heparin subq Code Status: DNR Family Communication: Discussed extensively with patient  Status is: Inpatient  Remains inpatient appropriate because:Ongoing diagnostic testing needed not appropriate for outpatient work up   Dispo: The patient is from: Home              Anticipated d/c is to: SNF (pt now agreeable for SNF)  Anticipated d/c date is: 1 day              Patient currently is medically stable to d/c., awaiting TOC  Consultants:   Vascular Surgery  Procedures:   US guided cannulation of L common femoral, aortogram with B LE runoff, angioplasty of R common femoral and stenting to the L external iliac on 6/7   L common femoral endarterectomy with L common fem to peroneal artery bypass for critical LLE ischemia on  6/8  Antimicrobials: Anti-infectives (From admission, onward)   Start     Dose/Rate Route Frequency Ordered Stop   11/29/19 1730  ceFAZolin (ANCEF) IVPB 2g/100 mL premix        2 g 200 mL/hr over 30 Minutes Intravenous Every 8 hours 11/29/19 1556 11/30/19 0109   11/29/19 0900  ceFAZolin (ANCEF) IVPB 2g/100 mL premix        2 g 200 mL/hr over 30 Minutes Intravenous 30 min pre-op 11/28/19 2108 11/29/19 1015      Subjective: Patient denies any new complaints, now wants to go to SNF (anywhere but [facility])      Objective: Vitals:   12/04/19 2327 12/05/19 0326 12/05/19 0823 12/05/19 1215  BP: (!) 102/59 (!) 116/47 (!) 110/54 119/62  Pulse: (!) 56 66 67 60  Resp: 20 20  20   Temp: (!) 97.3 F (36.3 C) 97.6 F (36.4 C) 98.3 F (36.8 C) (!) 97.5 F (36.4 C)  TempSrc: Oral Oral Oral Oral  SpO2: 96% 96% 98% 98%  Weight:      Height:        Intake/Output Summary (Last 24 hours) at 12/05/2019 1525 Last data filed at 12/04/2019 1916 Gross per 24 hour  Intake 720 ml  Output 300 ml  Net 420 ml   Filed Weights   11/26/19 2113 11/28/19 2108 11/29/19 0644  Weight: 76.4 kg 73.2 kg 72.7 kg    Examination:  General: NAD   Cardiovascular: S1, S2 present  Respiratory: CTAB  Abdomen: Soft, nontender, nondistended, bowel sounds present  Musculoskeletal: No bilateral pedal edema noted, LLE incision c/d/i  Skin: Normal  Psychiatry: Normal mood    Data Reviewed: I have personally reviewed following labs and imaging studies  CBC: Recent Labs  Lab 11/30/19 0432 12/01/19 0254 12/02/19 0423 12/03/19 0338 12/04/19 0517  WBC 11.5* 11.7* 12.3* 10.1 8.3  NEUTROABS  --  8.9* 8.9* 7.3 5.6  HGB 10.3* 9.0* 9.0* 8.9* 8.8*  HCT 31.3* 26.8* 27.8* 27.2* 26.5*  MCV 97.2 95.7 98.6 97.1 95.7  PLT 233 207 221 239 124   Basic Metabolic Panel: Recent Labs  Lab 12/01/19 0254 12/02/19 0423 12/03/19 0338 12/04/19 0517 12/05/19 0322  NA 128* 129* 130* 127* 130*  K 4.5 4.3 4.8 4.7  4.4  CL 99 100 98 97* 100  CO2 19* 17* 22 23 20*  GLUCOSE 217* 143* 166* 152* 129*  BUN 20 20 19 16 11   CREATININE 1.25* 1.17 1.19 1.17 1.05  CALCIUM 8.4* 8.5* 8.6* 8.7* 8.6*   GFR: Estimated Creatinine Clearance: 68.3 mL/min (by C-G formula based on SCr of 1.05 mg/dL). Liver Function Tests: Recent Labs  Lab 11/28/19 2352 11/29/19 0402  AST 17 16  ALT 18 17  ALKPHOS 80 74  BILITOT 0.3 0.6  PROT 6.6 6.4*  ALBUMIN 3.3* 3.2*   No results for input(s): LIPASE, AMYLASE in the last 168 hours. No results for input(s): AMMONIA in the last 168 hours. Coagulation Profile: Recent Labs  Lab 11/28/19 2352  INR 1.1   Cardiac Enzymes: No results for input(s): CKTOTAL, CKMB, CKMBINDEX, TROPONINI in the last 168 hours. BNP (last 3 results) No results for input(s): PROBNP in the last 8760 hours. HbA1C: No results for input(s): HGBA1C in the last 72 hours. CBG: Recent Labs  Lab 12/04/19 1059 12/04/19 1558 12/04/19 2134 12/05/19 0603 12/05/19 1210  GLUCAP 248* 194* 203* 137* 168*   Lipid Profile: No results for input(s): CHOL, HDL, LDLCALC, TRIG, CHOLHDL, LDLDIRECT in the last 72 hours. Thyroid Function Tests: No results for input(s): TSH, T4TOTAL, FREET4, T3FREE, THYROIDAB in the last 72 hours. Anemia Panel: Recent Labs    12/03/19 0338  VITAMINB12 137*  FOLATE 7.2  FERRITIN 167  TIBC 336  IRON 35*   Sepsis Labs: No results for input(s): PROCALCITON, LATICACIDVEN in the last 168 hours.  Recent Results (from the past 240 hour(s))  SARS Coronavirus 2 by RT PCR (hospital order, performed in [facility] hospital lab) Nasopharyngeal Nasopharyngeal Swab     Status: None   Collection Time: 11/25/19  7:50 PM   Specimen: Nasopharyngeal Swab  Result Value Ref Range Status   SARS Coronavirus 2 NEGATIVE NEGATIVE Final    Comment: (NOTE) SARS-CoV-2 target nucleic acids are NOT DETECTED. The SARS-CoV-2 RNA is generally detectable in upper and lower respiratory specimens during  the acute phase of infection. The lowest concentration of SARS-CoV-2 viral copies this assay can detect is 250 copies / mL. A negative result does not preclude SARS-CoV-2 infection and should not be used as the sole basis for treatment or other patient management decisions.  A negative result may occur with improper specimen collection / handling, submission of specimen other than nasopharyngeal swab, presence of viral mutation(s) within the areas targeted by this assay, and inadequate number of viral copies (<250 copies / mL). A negative result must be combined with clinical observations, patient history, and epidemiological information. Fact Sheet for Patients:   StrictlyIdeas.no Fact Sheet for Healthcare Providers: BankingDealers.co.za This test is not yet approved or cleared  by the [place] FDA and has been authorized for detection and/or diagnosis of SARS-CoV-2 by FDA under an Emergency Use Authorization (EUA).  This EUA will remain in effect (meaning this test can be used) for the duration of the COVID-19 declaration under Section 564(b)(1) of the Act, 21 U.S.C. section 360bbb-3(b)(1), unless the authorization is terminated or revoked sooner. Performed at [facility], [address], [place], [place] [address]   Surgical pcr screen     Status: Abnormal   Collection Time: 11/28/19 11:12 PM   Specimen: Nasal Mucosa; Nasal Swab  Result Value Ref Range Status   MRSA, PCR NEGATIVE NEGATIVE Final   Staphylococcus aureus POSITIVE (A) NEGATIVE Final    Comment: CRITICAL RESULT CALLED TO, READ BACK BY AND VERIFIED WITH: Jola Schmidt [id number] @0139  THANEY Performed at [facility] [address] [place], [place] [address]      Radiology Studies: VAS [place] ABI WITH/WO TBI  Result Date: 12/05/2019 LOWER EXTREMITY DOPPLER STUDY Indications: Post-Op.  Vascular Interventions: 11-29-19 S/P Left Bypass Graft  Femoral-Peroneal                          06-02-19 RT Leg Bypass Graft Fem-Pop and Rt great toe                         amputation. Comparison Study: Last study done 11-27-19 Performing  Technologist: Darlin Coco Supporting Technologist: Oda Cogan RDMS, RVT  Examination Guidelines: A complete evaluation includes at minimum, Doppler waveform signals and systolic blood pressure reading at the level of bilateral brachial, anterior tibial, and posterior tibial arteries, when vessel segments are accessible. Bilateral testing is considered an integral part of a complete examination. Photoelectric Plethysmograph (PPG) waveforms and toe systolic pressure readings are included as required and additional duplex testing as needed. Limited examinations for reoccurring indications may be performed as noted.  ABI Findings: +---------+------------------+-----+----------+----------------+ Right    Rt Pressure (mmHg)IndexWaveform  Comment          +---------+------------------+-----+----------+----------------+ Brachial 133                    triphasic                  +---------+------------------+-----+----------+----------------+ PTA      118               0.89 monophasic                 +---------+------------------+-----+----------+----------------+ DP       134               1.01 monophasic                 +---------+------------------+-----+----------+----------------+ Great Toe                                 Rt toe amputated +---------+------------------+-----+----------+----------------+ +---------+------------------+-----+----------+-------+ Left     Lt Pressure (mmHg)IndexWaveform  Comment +---------+------------------+-----+----------+-------+ Brachial 132                    triphasic         +---------+------------------+-----+----------+-------+ PERO                            monophasic        +---------+------------------+-----+----------+-------+ DP       89                 0.67 monophasic        +---------+------------------+-----+----------+-------+ Great Toe73                0.55                   +---------+------------------+-----+----------+-------+ +-------+-----------+-----------+------------+------------+ ABI/TBIToday's ABIToday's TBIPrevious ABIPrevious TBI +-------+-----------+-----------+------------+------------+ Right  1.0                   0.64                     +-------+-----------+-----------+------------+------------+ Left   0.67       0.55       0.29                     +-------+-----------+-----------+------------+------------+ Right ABIs appear increased compared to prior study on 11/27/2019. Left ABIs appear increased compared to prior study on 11/27/2019.  Summary: Right: Resting right ankle-brachial index is within normal range. No evidence of significant right lower extremity arterial disease. ABIs are unreliable. Left: Resting left ankle-brachial index indicates moderate left lower extremity arterial disease. The left toe-brachial index is abnormal.  *See table(s) above for measurements and observations.     Preliminary     Scheduled Meds: . aspirin EC  81 mg Oral Daily  . clopidogrel  75 mg Oral  Daily  . ferrous sulfate  325 mg Oral Q breakfast  . gabapentin  300 mg Oral BID  . heparin  5,000 Units Subcutaneous Q8H  . insulin aspart  0-15 Units Subcutaneous TID WC  . insulin aspart  0-5 Units Subcutaneous QHS  . levothyroxine  50 mcg Oral Daily  . metoprolol tartrate  12.5 mg Oral BID  . nicotine  21 mg Transdermal Daily  . pantoprazole  40 mg Oral Daily  . ranolazine  1,000 mg Oral BID  . rosuvastatin  20 mg Oral Daily  . sertraline  100 mg Oral Daily  . sodium bicarbonate  650 mg Oral BID  . vitamin B-12  1,000 mcg Oral Daily   Continuous Infusions: . magnesium sulfate bolus IVPB       LOS: 10 days   Alma Friendly, MD [facility] Pager On Amion  If 7PM-7AM, please  contact night-coverage 12/05/2019, 3:25 PM

## 2019-12-05 NOTE — TOC Initial Note (Signed)
Transition of Care [facility]) - Initial/Assessment Note    Patient Details  Name: Jason [name] MRN: [id number] Date of Birth: 03-08-1951  Transition of Care [facility]) CM/SW Contact:    Vinie Sill, [place] Phone Number: 12/05/2019, 4:40 PM  Clinical Narrative:                  CSW visit patient at bedside. CSW introduced self and explained role. CSW discussed PT recommendation for short term rehab at [facility]. Patient states he lives home. Patient states he has received rehab at [facility] before. Patient recognizes the need for physical therapy and was agreeable to discharge to SNF. Patient states no SNF preference. CSW explained the SNF process and answered all questions. CSW was given permission to send SNF referrals. Patient states he has received only one of the covid vaccines shots.   Patient will need insurance authorization and covid test results prior to discharge to SNF  CSW will provide bed offers once available.   Thurmond Butts, MSW, [place] Clinical Social Worker   Expected Discharge Plan: Skilled Nursing Facility Barriers to Discharge: Ship broker, SNF Pending bed offer   Patient Goals and CMS Choice        Expected Discharge Plan and Services Expected Discharge Plan: [place] In-house Referral: Clinical Social Work     Living arrangements for the past 2 months: Single Family Home                                      Prior Living Arrangements/Services Living arrangements for the past 2 months: Single Family Home Lives with:: Self Patient language and need for interpreter reviewed:: No        Need for Family Participation in Patient Care: No (Comment) Care giver support system in place?: Yes (comment)   Criminal Activity/Legal Involvement Pertinent to Current Situation/Hospitalization: No - Comment as needed  Activities of Daily Living Home Assistive Devices/Equipment: None ADL Screening (condition at time of  admission) Patient's cognitive ability adequate to safely complete daily activities?: Yes Is the patient deaf or have difficulty hearing?: No Does the patient have difficulty seeing, even when wearing glasses/contacts?: No Does the patient have difficulty concentrating, remembering, or making decisions?: No Patient able to express need for assistance with ADLs?: Yes Does the patient have difficulty dressing or bathing?: No Independently performs ADLs?: No Walks in Home: Needs assistance Does the patient have difficulty walking or climbing stairs?: Yes Weakness of Legs: Both Weakness of Arms/Hands: Right  Permission Sought/Granted Permission sought to share information with : Family Supports Permission granted to share information with : Yes, Verbal Permission Granted  Share Information with NAME: Asad Keeven  Permission granted to share info w AGENCY: SNFs  Permission granted to share info w Relationship: brother  Permission granted to share info w Contact Information: [phone]  Emotional Assessment Appearance:: Appears stated age Attitude/Demeanor/Rapport: Engaged Affect (typically observed): Accepting, Appropriate Orientation: : Oriented to Self, Oriented to Place, Oriented to  Time, Oriented to Situation Alcohol / Substance Use: Not Applicable Psych Involvement: No (comment)  Admission diagnosis:  PVD (peripheral vascular disease) ([place]) [I73.9] PAD (peripheral artery disease) ([place]) [I73.9] Claudication of both lower extremities ([facility]) [I73.9] Critical lower limb ischemia [I99.8] Patient Active Problem List   Diagnosis Date Noted  . Pressure injury of skin 12/04/2019  . Critical lower limb ischemia 11/26/2019  . Wound of lower extremity 11/26/2019  . Toe  fracture, right 11/26/2019  . Amputation of right great toe ([place]) 07/11/2019  . Nicotine abuse 06/12/2019  . Cellulitis of right foot 06/11/2019  . Diabetic foot infection ([place]) 06/11/2019  . PAD (peripheral artery  disease) ([place]) 06/11/2019  . Cellulitis 05/27/2019  . Gangrene of toe ([place]) 05/27/2019  . Status post lumbar spinal fusion 10/27/2017  . Acute on chronic combined systolic and diastolic CHF (congestive heart failure) ([place]) 10/25/2016  . NSTEMI (non-ST elevated myocardial infarction) ([place]) 10/22/2016  . Hypothyroidism 08/17/2015  . RBBB 05/11/2015  . Hyponatremia 04/04/2015  . Hypotension 04/04/2015  . Hemispheric carotid artery syndrome   . Carotid artery narrowing 03/28/2015  . Carotid stenosis- moderate 2011, 95% 2016 s/p stent 02/21/2014  . Unstable angina ([place]) 01/16/2014  . Tobacco abuse 01/16/2014  . Substance abuse in remission ([place]) 10/01/2012  . Radiculitis 02/10/2012  . CAD, CABG Feb 2011, cath x 4 since-medical Rx 10/03/2011  . PVD, hx Rt femoral endarterectomy 2004 10/03/2011  . DM (diabetes mellitus) ([place]) 10/02/2011  . HTN (hypertension) 10/02/2011  . Hyperlipidemia 10/02/2011   PCP:  Horald Pollen, MD Pharmacy:   [facility], [address] [place] [address] Phone: [phone] Fax: [phone]  [id number] [place], [address] [place]  [address] Phone: [phone] Fax: [phone]     Social Determinants of Health (SDOH) Interventions    Readmission Risk Interventions No flowsheet data found.

## 2019-12-06 ENCOUNTER — Other Ambulatory Visit: Payer: Self-pay | Admitting: Cardiovascular Disease

## 2019-12-06 LAB — BASIC METABOLIC PANEL
Anion gap: 11 (ref 5–15)
BUN: 10 mg/dL (ref 8–23)
CO2: 22 mmol/L (ref 22–32)
Calcium: 9.1 mg/dL (ref 8.9–10.3)
Chloride: 100 mmol/L (ref 98–111)
Creatinine, Ser: 1.26 mg/dL — ABNORMAL HIGH (ref 0.61–1.24)
GFR calc Af Amer: >60 mL/min (ref >60)
GFR calc non Af Amer: 59 mL/min — ABNORMAL LOW (ref >60)
Glucose, Bld: 151 mg/dL — ABNORMAL HIGH (ref 70–99)
Potassium: 4.4 mmol/L (ref 3.5–5.1)
Sodium: 133 mmol/L — ABNORMAL LOW (ref 135–145)

## 2019-12-06 LAB — GLUCOSE, CAPILLARY
Glucose-Capillary: 161 mg/dL — ABNORMAL HIGH (ref 70–99)
Glucose-Capillary: 159 mg/dL — ABNORMAL HIGH (ref 70–99)
Glucose-Capillary: 138 mg/dL — ABNORMAL HIGH (ref 70–99)
Glucose-Capillary: 152 mg/dL — ABNORMAL HIGH (ref 70–99)

## 2019-12-06 NOTE — Progress Notes (Addendum)
Physical Therapy Treatment Patient Details Name: Jason [name] MRN: [id number] DOB: Apr 26, 1951 Today's Date: 12/06/2019    History of Present Illness Jason [name] is a 69 y.o. male with medical history significant for PVD s/p R fem-pop bypass, COPD, CAD s/p CABG, T2DM, HTN, HLD who presents with worsening bilateral lower extremity pain. Pt underwent L CFAl endarterectomy and L common femoral to peroneal artery bypass.    PT Comments    Pt OOB in recliner upon arrival of PT, agreeable to session with focus on progressing functional mobility and stability. The pt was able to demo good tolerance for ambulation in hallway with use of RW, but reports significant fatigue. The pt was also challenged by ambulation without use of AD, and was limited to short bouts of ambulation with modA through HHA to complete without use of RW. The pt will continue to benefit from skilled PT acutely and following d/c to maximize rehab and facilitate return to prior level of function and independence as the pt lives alone with 7 steps to enter his home.   5X Sit-to-Stand: 45 sec (> 11.4 sec indicates increased risk of falls for individuals aged 60-69, > 15 sec indicates increased risk of recurrent falls)     Follow Up Recommendations  SNF;Supervision/Assistance - 24 hour     Equipment Recommendations  None recommended by PT    Recommendations for Other Services       Precautions / Restrictions Precautions Precautions: Fall;Other (comment) Precaution Comments: pt with noted R 4th toe fx however no plan Restrictions Other Position/Activity Restrictions: per chart pt with incidental finding R 4th toe fx, no treatment plan or post op shoe ordered    Mobility  Bed Mobility Overal bed mobility: Needs Assistance             General bed mobility comments: Pt up in chair  Transfers Overall transfer level: Needs assistance Equipment used: Rolling walker (2 wheeled) Transfers: Sit to/from  Stand Sit to Stand: Min guard         General transfer comment: minG with sig extra time and VC for hand placement from recliner. completed from chair without arm rest 5xSTS in 45 sec  Ambulation/Gait Ambulation/Gait assistance: Min guard;Min assist Gait Distance (Feet): 150 Feet Assistive device: Rolling walker (2 wheeled) Gait Pattern/deviations: Step-through pattern;Wide base of support;Decreased step length - right;Decreased step length - left;Shuffle;Trunk flexed Gait velocity: 0.3 m/s Gait velocity interpretation: <1.31 ft/sec, indicative of household ambulator General Gait Details: pt with shuffling gait and LLE ER. needs cues for proximity to RW, significant reliance on BUE support, increased lateral movement with each step   Stairs             Wheelchair Mobility    Modified Rankin (Stroke Patients Only)       Balance Overall balance assessment: Needs assistance Sitting-balance support: No upper extremity supported;Feet supported Sitting balance-Leahy Scale: Fair     Standing balance support: Bilateral upper extremity supported Standing balance-Leahy Scale: Poor Standing balance comment: reliant on external assist                             Cognition Arousal/Alertness: Awake/alert Behavior During Therapy: WFL for tasks assessed/performed Overall Cognitive Status: Within Functional Limits for tasks assessed                                 General Comments:  pt aware of deficits and has good problem solving      Exercises      General Comments General comments (skin integrity, edema, etc.): VSS on RA, LLE incision with drainage requiring change of linnens      Pertinent Vitals/Pain Pain Assessment: Faces Faces Pain Scale: Hurts a little bit Pain Location: LLE and feet (R toe fx and L great toenail) Pain Descriptors / Indicators: Sore;Guarding Pain Intervention(s): Limited activity within patient's tolerance;Monitored  during session    Home Living                      Prior Function            PT Goals (current goals can now be found in the care plan section) Acute Rehab PT Goals Patient Stated Goal: home PT Goal Formulation: With patient Time For Goal Achievement: 12/14/19 Potential to Achieve Goals: Good Progress towards PT goals: Progressing toward goals    Frequency    Min 3X/week      PT Plan Current plan remains appropriate    Co-evaluation              AM-PAC PT "6 Clicks" Mobility   Outcome Measure  Help needed turning from your back to your side while in a flat bed without using bedrails?: None Help needed moving from lying on your back to sitting on the side of a flat bed without using bedrails?: A Little Help needed moving to and from a bed to a chair (including a wheelchair)?: A Little Help needed standing up from a chair using your arms (e.g., wheelchair or bedside chair)?: A Little Help needed to walk in hospital room?: A Little Help needed climbing 3-5 steps with a railing? : A Little 6 Click Score: 19    End of Session Equipment Utilized During Treatment: Gait belt Activity Tolerance: Patient tolerated treatment well Patient left: in chair;with call bell/phone within reach Nurse Communication: Mobility status PT Visit Diagnosis: Unsteadiness on feet (R26.81);Difficulty in walking, not elsewhere classified (R26.2)     Time: 8127-5170 PT Time Calculation (min) (ACUTE ONLY): 27 min  Charges:  $Gait Training: 23-37 mins                     Karma Ganja, PT, DPT   Acute Rehabilitation Department Pager #: [phone]   Otho Bellows 12/06/2019, 4:23 PM

## 2019-12-06 NOTE — Progress Notes (Signed)
PROGRESS NOTE    Jason [name]  RXV:[id number] DOB: [birth year] DOA: 11/25/2019 PCP: Horald Pollen, MD    Brief Narrative:  69 y.o. male with medical history significant for PVD s/p R fem-pop bypass, COPD, CAD s/p CABG, T2DM, HTN, HLD who presents with worsening bilateral lower extremity pain for about 1 month, worse with ambulation and better at rest.  Also noted left foot heel wound and also an ulcer on the plantar surface of the right foot. Patient continues to be a current smoker of at least 1 pack/day. In the ED, intermittently hypotensive with improvement with IV fluids.  Lab work notable for elevated creatinine 1.29 from a prior of 1.18.  Sodium of 129.  Mild leukocytosis of 10.9. No signs of necrosis on feet but patient did not have palpable or doppler findings of posterior tibial or dorsalis pedis pulse on the left side. Right foot x-ray was negative for osteomyelitis but had incidental finding of fourth toe fracture. ABI in the ED showed left lower extremity of 0.68 and right foot 0.81.  Last left ABI in February 2021 was 0.24. ED physician Dr. Rex Kras discussed case with vascular surgeon Dr. Oneida Alar    Assessment & Plan:   Principal Problem:   Critical lower limb ischemia Active Problems:   DM (diabetes mellitus) ([place])   CAD, CABG Feb 2011, cath x 4 since-medical Rx   PVD, hx Rt femoral endarterectomy 2004   Tobacco abuse   Hyponatremia   Hypotension   Hypothyroidism   Wound of lower extremity   Toe fracture, right   Pressure injury of skin   Bilateral lower extremity claudication/Left ischemic limb with history of PVD ABI in the ED with left lower extremity of 0.68 and right foot 0.81.  Last left ABI in February 2021 was 0.24.  No palpable or Doppler findings of PT or DP pulses on left side Vascular on board, underwent US guided cannulation of L common femoral, aortogram with B LE runoff, angioplasty of R common femoral and stenting to the L external iliac on 6/7  and L common femoral endarterectomy with L common fem to peroneal artery bypass for critical LLE ischemia Patient is continued on aspirin and Plavix  Leukocytosis Resolved ?recative, afebrile, no signs of infection CXR without acute infiltrate Daily cbc  Left great toe nail, heel, lateral wound/ Right plantar ulcer wound at base of 4th MTP Wound care was consulted  Right 4th toe fracture Cont with rigid sole shoes for ambulation  Hypertension Initially hypotensive, currently stable Continued on metoprolol with hold parameters  AKI/metabolic acidosis Cr worsening D/C IVF PO bicarb X 2 days Daily BMP  Chronic hyponatremia Ongoing for years Daily BMP  Iron def/Vit B12 def/anemia of chronic disease Hemoglobin slowing trending down Anemia panel showed iron 35, sats 10, Vit B12 137 S/P 1 dose of feraheme, [place] cyanocobalamin on 12/03/19, continue oral supplements Daily CBC  Tobacco abuse Continued on nicotine patch  Type 2 diabetes mellitus SSI, accuchecks, hypoglycemic protocol  History of CAD s/p CABG Continue Ranexa, statin  Hypothyroidism Continue levothyroxine     DVT prophylaxis: heparin subq Code Status: DNR Family Communication: Discussed extensively with patient  Status is: Inpatient  Remains inpatient appropriate because:Ongoing diagnostic testing needed not appropriate for outpatient work up   Dispo: The patient is from: Home              Anticipated d/c is to: SNF (pt now agreeable for SNF)  Anticipated d/c date is: 1 day              Patient currently is medically stable to d/c., awaiting placement  Consultants:   Vascular Surgery  Procedures:   US guided cannulation of L common femoral, aortogram with B LE runoff, angioplasty of R common femoral and stenting to the L external iliac on 6/7   L common femoral endarterectomy with L common fem to peroneal artery bypass for critical LLE ischemia on  6/8  Antimicrobials: Anti-infectives (From admission, onward)   Start     Dose/Rate Route Frequency Ordered Stop   11/29/19 1730  ceFAZolin (ANCEF) IVPB 2g/100 mL premix        2 g 200 mL/hr over 30 Minutes Intravenous Every 8 hours 11/29/19 1556 11/30/19 0109   11/29/19 0900  ceFAZolin (ANCEF) IVPB 2g/100 mL premix        2 g 200 mL/hr over 30 Minutes Intravenous 30 min pre-op 11/28/19 2108 11/29/19 1015      Subjective: Patient with LLE swelling with some mild drainage noted.    Objective: Vitals:   12/06/19 0500 12/06/19 0730 12/06/19 1111 12/06/19 1655  BP: (!) 112/54 114/69 (!) 112/51 (!) 137/43  Pulse:  60 62 62  Resp:  16 17 16   Temp: 98 F (36.7 C) 97.7 F (36.5 C) 97.6 F (36.4 C) 97.7 F (36.5 C)  TempSrc: Oral Oral Oral Oral  SpO2: 96% 100% 98% 98%  Weight:      Height:        Intake/Output Summary (Last 24 hours) at 12/06/2019 1707 Last data filed at 12/06/2019 0330 Gross per 24 hour  Intake 120 ml  Output 650 ml  Net -530 ml   Filed Weights   11/26/19 2113 11/28/19 2108 11/29/19 0644  Weight: 76.4 kg 73.2 kg 72.7 kg    Examination:  General: NAD   Cardiovascular: S1, S2 present  Respiratory: CTAB  Abdomen: Soft, nontender, nondistended, bowel sounds present  Musculoskeletal: No bilateral pedal edema noted, LLE incision with some mild drainage, LLE mild swelling  Skin: Normal  Psychiatry: Normal mood    Data Reviewed: I have personally reviewed following labs and imaging studies  CBC: Recent Labs  Lab 11/30/19 0432 12/01/19 0254 12/02/19 0423 12/03/19 0338 12/04/19 0517  WBC 11.5* 11.7* 12.3* 10.1 8.3  NEUTROABS  --  8.9* 8.9* 7.3 5.6  HGB 10.3* 9.0* 9.0* 8.9* 8.8*  HCT 31.3* 26.8* 27.8* 27.2* 26.5*  MCV 97.2 95.7 98.6 97.1 95.7  PLT 233 207 221 239 161   Basic Metabolic Panel: Recent Labs  Lab 12/02/19 0423 12/03/19 0338 12/04/19 0517 12/05/19 0322 12/06/19 0249  NA 129* 130* 127* 130* 133*  K 4.3 4.8 4.7 4.4 4.4   CL 100 98 97* 100 100  CO2 17* 22 23 20* 22  GLUCOSE 143* 166* 152* 129* 151*  BUN 20 19 16 11 10   CREATININE 1.17 1.19 1.17 1.05 1.26*  CALCIUM 8.5* 8.6* 8.7* 8.6* 9.1   GFR: Estimated Creatinine Clearance: 56.9 mL/min (A) (by C-G formula based on SCr of 1.26 mg/dL (H)). Liver Function Tests: No results for input(s): AST, ALT, ALKPHOS, BILITOT, PROT, ALBUMIN in the last 168 hours. No results for input(s): LIPASE, AMYLASE in the last 168 hours. No results for input(s): AMMONIA in the last 168 hours. Coagulation Profile: No results for input(s): INR, PROTIME in the last 168 hours. Cardiac Enzymes: No results for input(s): CKTOTAL, CKMB, CKMBINDEX, TROPONINI in the last 168 hours. BNP (last  3 results) No results for input(s): PROBNP in the last 8760 hours. HbA1C: No results for input(s): HGBA1C in the last 72 hours. CBG: Recent Labs  Lab 12/05/19 1709 12/05/19 2059 12/06/19 0631 12/06/19 1110 12/06/19 1623  GLUCAP 115* 219* 161* 159* 138*   Lipid Profile: No results for input(s): CHOL, HDL, LDLCALC, TRIG, CHOLHDL, LDLDIRECT in the last 72 hours. Thyroid Function Tests: No results for input(s): TSH, T4TOTAL, FREET4, T3FREE, THYROIDAB in the last 72 hours. Anemia Panel: No results for input(s): VITAMINB12, FOLATE, FERRITIN, TIBC, IRON, RETICCTPCT in the last 72 hours. Sepsis Labs: No results for input(s): PROCALCITON, LATICACIDVEN in the last 168 hours.  Recent Results (from the past 240 hour(s))  Surgical pcr screen     Status: Abnormal   Collection Time: 11/28/19 11:12 PM   Specimen: Nasal Mucosa; Nasal Swab  Result Value Ref Range Status   MRSA, PCR NEGATIVE NEGATIVE Final   Staphylococcus aureus POSITIVE (A) NEGATIVE Final    Comment: CRITICAL RESULT CALLED TO, READ BACK BY AND VERIFIED WITH: Jola Schmidt [id number] @[phone]  THANEY Performed at [facility] [address] [place], [place] [address]      Radiology Studies: VAS [place] ABI WITH/WO TBI  Result  Date: 12/05/2019 LOWER EXTREMITY DOPPLER STUDY Indications: Post-Op.  Vascular Interventions: 11-29-19 S/P Left Bypass Graft Femoral-Peroneal                          06-02-19 RT Leg Bypass Graft Fem-Pop and Rt great toe                         amputation. Comparison Study: Last study done 11-27-19 Performing Technologist: Darlin Coco Supporting Technologist: Oda Cogan RDMS, RVT  Examination Guidelines: A complete evaluation includes at minimum, Doppler waveform signals and systolic blood pressure reading at the level of bilateral brachial, anterior tibial, and posterior tibial arteries, when vessel segments are accessible. Bilateral testing is considered an integral part of a complete examination. Photoelectric Plethysmograph (PPG) waveforms and toe systolic pressure readings are included as required and additional duplex testing as needed. Limited examinations for reoccurring indications may be performed as noted.  ABI Findings: +---------+------------------+-----+----------+----------------+ Right    Rt Pressure (mmHg)IndexWaveform  Comment          +---------+------------------+-----+----------+----------------+ Brachial 133                    triphasic                  +---------+------------------+-----+----------+----------------+ PTA      118               0.89 monophasic                 +---------+------------------+-----+----------+----------------+ DP       134               1.01 monophasic                 +---------+------------------+-----+----------+----------------+ Great Toe                                 Rt toe amputated +---------+------------------+-----+----------+----------------+ +---------+------------------+-----+----------+-------+ Left     Lt Pressure (mmHg)IndexWaveform  Comment +---------+------------------+-----+----------+-------+ Brachial 132                    triphasic         +---------+------------------+-----+----------+-------+  PERO                            monophasic        +---------+------------------+-----+----------+-------+ DP       89                0.67 monophasic        +---------+------------------+-----+----------+-------+ Great Toe73                0.55                   +---------+------------------+-----+----------+-------+ +-------+-----------+-----------+------------+------------+ ABI/TBIToday's ABIToday's TBIPrevious ABIPrevious TBI +-------+-----------+-----------+------------+------------+ Right  1.0                   0.64                     +-------+-----------+-----------+------------+------------+ Left   0.67       0.55       0.29                     +-------+-----------+-----------+------------+------------+ Right ABIs appear increased compared to prior study on 11/27/2019. Left ABIs appear increased compared to prior study on 11/27/2019.  Summary: Right: Resting right ankle-brachial index is within normal range. No evidence of significant right lower extremity arterial disease. ABIs are unreliable. Left: Resting left ankle-brachial index indicates moderate left lower extremity arterial disease. The left toe-brachial index is abnormal.  *See table(s) above for measurements and observations.  Electronically signed by Curt Jews MD on 12/05/2019 at 5:36:23 PM.    Final     Scheduled Meds: . aspirin EC  81 mg Oral Daily  . clopidogrel  75 mg Oral Daily  . ferrous sulfate  325 mg Oral Q breakfast  . gabapentin  300 mg Oral BID  . heparin  5,000 Units Subcutaneous Q8H  . insulin aspart  0-15 Units Subcutaneous TID WC  . insulin aspart  0-5 Units Subcutaneous QHS  . levothyroxine  50 mcg Oral Daily  . metoprolol tartrate  12.5 mg Oral BID  . nicotine  21 mg Transdermal Daily  . pantoprazole  40 mg Oral Daily  . ranolazine  1,000 mg Oral BID  . rosuvastatin  20 mg Oral Daily  . sertraline  100 mg Oral Daily  . vitamin B-12  1,000 mcg Oral Daily   Continuous  Infusions: . magnesium sulfate bolus IVPB       LOS: 11 days   Alma Friendly, MD [facility] Pager On Amion  If 7PM-7AM, please contact night-coverage 12/06/2019, 5:07 PM

## 2019-12-06 NOTE — TOC Progression Note (Addendum)
Transition of Care [facility]) - Progression Note    Patient Details  Name: Jason [name] MRN: [id number] Date of Birth: 1951/02/11  Transition of Care [facility]) CM/SW [place], [place] Phone Number: 12/06/2019, 3:04 PM  Clinical Narrative:     CSW met with patient and provided bed offers. Patient selected [facility] called SNF and confirmed bed offers with the understanding facility has no smoking policy and he will be quarantine for 14 days. CSW also discussed he has ambulated over 553ft and his insurance may not approve. Patient agrees he has progressed well.  CSW advised his  insurance remains pending at this time. Patient will discuss discharge plan with his brother and inform CSW SNF verses Home.   CThurmond Butts MSW, LReeltownClinical Social Worker   Expected Discharge Plan: Skilled Nursing Facility Barriers to Discharge: IShip broker SNF Pending bed offer  Expected Discharge Plan and Services Expected Discharge Plan: [place] Referral: Clinical Social Work     Living arrangements for the past 2 months: Single Family Home                                       Social Determinants of Health (SDOH) Interventions    Readmission Risk Interventions No flowsheet data found.

## 2019-12-06 NOTE — Progress Notes (Signed)
Mobility Specialist - Progress Note   12/06/19 1331  Mobility  Activity Ambulated in hall;Ambulated to bathroom  Level of Assistance Modified independent, requires aide device or extra time  Assistive Device Front wheel walker  Distance Ambulated (ft) 540 ft  Mobility Response Tolerated well  Mobility performed by Mobility specialist  $Mobility charge 1 Mobility    Pre-mobility: 65 HR, 111/50 BP, 93% SpO2 Post-mobility: 73 HR, 128/50 BP, 100% SpO2   Evelean Bigler Solicitor

## 2019-12-06 NOTE — TOC Progression Note (Signed)
Transition of Care [facility]) - Progression Note    Patient Details  Name: CARSON BOGDEN MRN: [id number] Date of Birth: 05/24/1951  Transition of Care [facility]) CM/SW [place], [place] Phone Number: 12/06/2019, 10:15 AM  Clinical Narrative:     Patient currently has no bed offers-  CSW started insurance authorization  Thurmond Butts, MSW, LCSWA Clinical Social Worker   Expected Discharge Plan: Skilled Nursing Facility Barriers to Discharge: Ship broker, SNF Pending bed offer  Expected Discharge Plan and Services Expected Discharge Plan: [place] In-house Referral: Clinical Social Work     Living arrangements for the past 2 months: Single Family Home                                       Social Determinants of Health (SDOH) Interventions    Readmission Risk Interventions No flowsheet data found.

## 2019-12-06 NOTE — Progress Notes (Addendum)
  Progress Note    12/06/2019 7:43 AM 7 Days Post-Op  Subjective:  No new complaints   Vitals:   12/06/19 0500 12/06/19 0730  BP: (!) 112/54 114/69  Pulse:  60  Resp:  16  Temp: 98 F (36.7 C) 97.7 F (36.5 C)  SpO2: 96% 100%   Physical Exam: Lungs:  Non labored Incisions:  LLE incisions c/d/i Extremities:  LLE edematous to the level of the thigh; brisk DP by doppler Abdomen:  soft Neurologic: A&O  CBC    Component Value Date/Time   WBC 8.3 12/04/2019 0517   RBC 2.77 (L) 12/04/2019 0517   HGB 8.8 (L) 12/04/2019 0517   HCT 26.5 (L) 12/04/2019 0517   PLT 223 12/04/2019 0517   MCV 95.7 12/04/2019 0517   MCV 96.7 12/23/2016 1549   MCH 31.8 12/04/2019 0517   MCHC 33.2 12/04/2019 0517   RDW 13.2 12/04/2019 0517   LYMPHSABS 1.6 12/04/2019 0517   MONOABS 0.8 12/04/2019 0517   EOSABS 0.2 12/04/2019 0517   BASOSABS 0.0 12/04/2019 0517    BMET    Component Value Date/Time   NA 133 (L) 12/06/2019 0249   NA 131 (L) 09/30/2017 1629   K 4.4 12/06/2019 0249   CL 100 12/06/2019 0249   CO2 22 12/06/2019 0249   GLUCOSE 151 (H) 12/06/2019 0249   BUN 10 12/06/2019 0249   BUN 13 09/30/2017 1629   CREATININE 1.26 (H) 12/06/2019 0249   CREATININE 1.14 07/12/2015 1220   CALCIUM 9.1 12/06/2019 0249   GFRNONAA 59 (L) 12/06/2019 0249   GFRNONAA 69 07/12/2015 1220   GFRAA >60 12/06/2019 0249   GFRAA 79 07/12/2015 1220    INR    Component Value Date/Time   INR 1.1 11/28/2019 2352     Intake/Output Summary (Last 24 hours) at 12/06/2019 0743 Last data filed at 12/06/2019 0330 Gross per 24 hour  Intake 120 ml  Output 650 ml  Net -530 ml     Assessment/Plan:  69 y.o. male is s/p L CFA endart and femoral to peroneal bypass with vein 7 Days Post-Op   L foot well perfused Encouraged elevation of LLE when in bed due to edema [facility] for discharge from vascular standpoint    Dagoberto Ligas, PA-C [facility] [phone] 12/06/2019 7:43 AM   I have  independently interviewed and examined patient and agree with PA assessment and plan above.   Lyberti Thrush C. Donzetta Matters, MD [facility] [place] Office: [phone] Pager: [phone]

## 2019-12-07 LAB — BASIC METABOLIC PANEL
Anion gap: 8 (ref 5–15)
BUN: 11 mg/dL (ref 8–23)
CO2: 22 mmol/L (ref 22–32)
Calcium: 8.9 mg/dL (ref 8.9–10.3)
Chloride: 99 mmol/L (ref 98–111)
Creatinine, Ser: 1.48 mg/dL — ABNORMAL HIGH (ref 0.61–1.24)
GFR calc Af Amer: 56 mL/min — ABNORMAL LOW (ref >60)
GFR calc non Af Amer: 48 mL/min — ABNORMAL LOW (ref >60)
Glucose, Bld: 147 mg/dL — ABNORMAL HIGH (ref 70–99)
Potassium: 5 mmol/L (ref 3.5–5.1)
Sodium: 129 mmol/L — ABNORMAL LOW (ref 135–145)

## 2019-12-07 LAB — CBC WITH DIFFERENTIAL/PLATELET
Abs Immature Granulocytes: 0.41 10*3/uL — ABNORMAL HIGH (ref 0.00–0.07)
Basophils Absolute: 0.1 10*3/uL (ref 0.0–0.1)
Basophils Relative: 1 %
Eosinophils Absolute: 0.3 10*3/uL (ref 0.0–0.5)
Eosinophils Relative: 3 %
HCT: 28.6 % — ABNORMAL LOW (ref 39.0–52.0)
Hemoglobin: 9.4 g/dL — ABNORMAL LOW (ref 13.0–17.0)
Immature Granulocytes: 4 %
Lymphocytes Relative: 23 %
Lymphs Abs: 2.2 10*3/uL (ref 0.7–4.0)
MCH: 32.1 pg (ref 26.0–34.0)
MCHC: 32.9 g/dL (ref 30.0–36.0)
MCV: 97.6 fL (ref 80.0–100.0)
Monocytes Absolute: 1 10*3/uL (ref 0.1–1.0)
Monocytes Relative: 10 %
Neutro Abs: 5.8 10*3/uL (ref 1.7–7.7)
Neutrophils Relative %: 59 %
Platelets: 283 10*3/uL (ref 150–400)
RBC: 2.93 MIL/uL — ABNORMAL LOW (ref 4.22–5.81)
RDW: 13.6 % (ref 11.5–15.5)
WBC: 9.7 10*3/uL (ref 4.0–10.5)
nRBC: 0 % (ref 0.0–0.2)

## 2019-12-07 LAB — GLUCOSE, CAPILLARY
Glucose-Capillary: 119 mg/dL — ABNORMAL HIGH (ref 70–99)
Glucose-Capillary: 171 mg/dL — ABNORMAL HIGH (ref 70–99)
Glucose-Capillary: 188 mg/dL — ABNORMAL HIGH (ref 70–99)
Glucose-Capillary: 155 mg/dL — ABNORMAL HIGH (ref 70–99)

## 2019-12-07 LAB — SARS CORONAVIRUS 2 BY RT PCR (HOSPITAL ORDER, PERFORMED IN ~~LOC~~ HOSPITAL LAB): SARS Coronavirus 2: NEGATIVE

## 2019-12-07 MED ORDER — NICOTINE 21 MG/24HR TD PT24: 21.0000 mg | MEDICATED_PATCH | Freq: Every day | TRANSDERMAL | 0 refills | Status: DC

## 2019-12-07 MED ORDER — METOPROLOL TARTRATE 25 MG PO TABS: 12.5000 mg | ORAL_TABLET | Freq: Two times a day (BID) | ORAL | 0 refills | Status: DC

## 2019-12-07 MED ORDER — FERROUS SULFATE 325 (65 FE) MG PO TABS: 325.0000 mg | ORAL_TABLET | Freq: Every day | ORAL | 3 refills | Status: DC

## 2019-12-07 MED ORDER — GABAPENTIN 300 MG PO CAPS: 300.0000 mg | ORAL_CAPSULE | Freq: Two times a day (BID) | ORAL | 1 refills | Status: DC

## 2019-12-07 MED ORDER — CYANOCOBALAMIN 1000 MCG PO TABS: 1000.0000 ug | ORAL_TABLET | Freq: Every day | ORAL | Status: DC

## 2019-12-07 MED ORDER — PANTOPRAZOLE SODIUM 40 MG PO TBEC: 40.0000 mg | DELAYED_RELEASE_TABLET | Freq: Every day | ORAL | 0 refills | Status: DC

## 2019-12-07 NOTE — Progress Notes (Signed)
PT Cancellation Note  Patient Details Name: Jason [name] MRN: [id number] DOB: [birth year]   Cancelled Treatment:    Reason Eval/Treat Not Completed: Other (comment).  Pt in BR then had his dinner.  Retry as time and pt allow.   Ramond Dial 12/07/2019, 5:47 PM   Mee Hives, PT MS Acute Rehab Dept. Number: [place] and [place]

## 2019-12-07 NOTE — Progress Notes (Signed)
Mobility Specialist: Progress Note    12/07/19 1422  Mobility  Activity Ambulated in hall  Level of Assistance Modified independent, requires aide device or extra time  Assistive Device Front wheel walker  Distance Ambulated (ft) 510 ft  Mobility Response Tolerated well  Mobility performed by Mobility specialist  Bed Position Chair  $Mobility charge 1 Mobility   Pre-Mobility: 68 HR, 118/52 BP Post-Mobility: 71 HR, 132/47 BP  Pt had to take two standing rest breaks w/ c/o stiffness and soreness in R hamstrings and calf.   [facility] Gailyn Crook Mobility Specialist

## 2019-12-07 NOTE — TOC Progression Note (Signed)
Transition of Care [facility]) - Progression Note    Patient Details  Name: HILL MACKIE MRN: [id number] Date of Birth: 20-Apr-1951  Transition of Care [facility]) CM/SW [place], [place] Phone Number: 12/07/2019, 3:03 PM  Clinical Narrative:     Received insurance authorization # [id number] from 06/16-06/18. CSW contacted [facility]- waiting on response to confirm of they can admit patient today.  Thurmond Butts, MSW, [place] Clinical Social Worker   Expected Discharge Plan: Skilled Nursing Facility Barriers to Discharge: Ship broker, SNF Pending bed offer  Expected Discharge Plan and Services Expected Discharge Plan: [place] In-house Referral: Clinical Social Work     Living arrangements for the past 2 months: Single Family Home                                       Social Determinants of Health (SDOH) Interventions    Readmission Risk Interventions No flowsheet data found.

## 2019-12-07 NOTE — Discharge Summary (Addendum)
Physician Discharge Summary  DAKARI CREGGER DJM:[id number] DOB: 02-08-1951 DOA: 11/25/2019  PCP: Horald Pollen, MD  Admit date: 11/25/2019 Discharge date: 12/08/2019  Admitted From: Home.  Disposition: SNF Recommendations for Outpatient Follow-up:  1. Follow up with PCP in 1-2 weeks 2. Please obtain BMP/CBC in 1 to 2 days.  Please follow up with vascular surgery as recommended.    Discharge Condition: STABLE  CODE STATUS: DNR Diet recommendation: Heart Healthy  Brief/Interim Summary: 69 y.o.malewith medical history significant forPVD s/p R fem-pop bypass, COPD, CAD s/p CABG, T2DM, HTN, HLD whopresents with worsening bilateral lower extremity pain for about 1 month, worse with ambulation and better at rest. Also noted left foot heel wound and also an ulcer on the plantar surface of the right foot. Patient continues to be a current smoker of at least 1 pack/day. In the ED, intermittently hypotensive with improvement with IV fluids. Lab work notable for elevated creatinine 1.29 from a prior of 1.18. Sodium of 129. Mild leukocytosis of 10.9. No signs of necrosis on feet but patient did not have palpable or doppler findings of posterior tibial or dorsalis pedis pulse on the left side. Right foot x-ray was negative for osteomyelitis but had incidental finding of fourth toe fracture. ABI in the ED showed left lower extremity of 0.68 and right foot 0.81. Last left ABI in February 2021 was 0.24. ED physician Dr. Rex Kras discussed case with vascular surgeon Dr. Oneida Alar. underwent US guided cannulation of L common femoral, aortogram with B LE runoff, angioplasty of R common femoral and stenting to the L external iliac on 6/7 and L common femoral endarterectomy with L common fem to peroneal artery bypass for critical LLE ischemia Patient is continued on aspirin and Plavix  Discharge Diagnoses:  Principal Problem:   Critical lower limb ischemia Active Problems:   DM (diabetes mellitus)  ([place])   CAD, CABG Feb 2011, cath x 4 since-medical Rx   PVD, hx Rt femoral endarterectomy 2004   Tobacco abuse   Hyponatremia   Hypotension   Hypothyroidism   Wound of lower extremity   Toe fracture, right   Pressure injury of skin  Bilateral lower extremity claudication/Left ischemic limbwith history of PVD ABI in the ED with left lower extremity of 0.68 and right foot 0.81. Last left ABI in February 2021 was 0.24.No palpable or Doppler findings of PT or DP pulses on left side Vascular on board, underwent US guided cannulation of L common femoral, aortogram with B LE runoff, angioplasty of R common femoral and stenting to the L external iliac on 6/7 and L common femoral endarterectomy with L common fem to peroneal artery bypass for critical LLE ischemia Patient is continued on aspirin and Plavix  Leukocytosis Resolved ?recative, afebrile, no signs of infection CXR without acute infiltrate Daily cbc  Left great toe nail, heel, lateral wound/ Right plantar ulcer wound at base of 4th MTP Wound care was consulted  Right 4th toe fracture Cont with rigid sole shoes for ambulation  Hypertension Initially hypotensive, currently stable Continued on metoprolol with hold parameters  AKI/metabolic acidosis Slight worsening of creatinine.  PO bicarb X 2 days . Recommend getting BMP in I to 2 days.    Chronic hyponatremia Ongoing for years Daily BMP  Iron def/Vit B12 def/anemia of chronic disease Hemoglobin slowing trending down Anemia panel showed iron 35, sats 10, Vit B12 137 S/P 1 dose of feraheme, [place] cyanocobalamin on 12/03/19, continue oral supplements   Tobacco abuse Continued on nicotine  patch  Type 2 diabetes mellitus SSI, accuchecks, hypoglycemic protocol  History of CAD s/p CABG Continue Ranexa, statin  Hypothyroidism Continue levothyroxine   Pressure Injury 12/04/19 Buttocks Right;Left;Mid Stage 1 -  Intact skin with non-blanchable redness of a  localized area usually over a bony prominence. redden (Active)  12/04/19 0600  Location: Buttocks  Location Orientation: Right;Left;Mid  Staging: Stage 1 -  Intact skin with non-blanchable redness of a localized area usually over a bony prominence.  Wound Description (Comments): redden  Present on Admission:    WOUND CARE consulted for recommendations.      Discharge Instructions  Discharge Instructions    Change dressing (specify)   Complete by: As directed    Wound care as per Dr Oneida Alar.   Diet - low sodium heart healthy   Complete by: As directed    Discharge instructions   Complete by: As directed    Please follow up with vascular surgery as recommended.     Allergies as of 12/07/2019      Reactions   Coreg [carvedilol] Nausea And Vomiting, Other (See Comments)   Per patient made him dizzy and light sensitive   Sulfa Antibiotics Nausea And Vomiting, Other (See Comments)   Also headaches      Medication List    STOP taking these medications   metoprolol succinate 25 MG 24 hr tablet Commonly known as: TOPROL-XL     TAKE these medications   acetaminophen 325 MG tablet Commonly known as: TYLENOL Take 650 mg by mouth every 6 (six) hours as needed for mild pain or headache.   aspirin 81 MG tablet Take 1 tablet (81 mg total) by mouth daily.   clopidogrel 75 MG tablet Commonly known as: PLAVIX Take 1 tablet (75 mg total) by mouth daily.   cyanocobalamin 1000 MCG tablet Take 1 tablet (1,000 mcg total) by mouth daily. Start taking on: December 08, 2019   ferrous sulfate 325 (65 FE) MG tablet Take 1 tablet (325 mg total) by mouth daily with breakfast. Start taking on: December 08, 2019   gabapentin 300 MG capsule Commonly known as: NEURONTIN Take 1 capsule (300 mg total) by mouth 2 (two) times daily.   Janumet 50-500 MG tablet Generic drug: sitaGLIPtin-metformin Take 1 tablet by mouth 2 (two) times daily with a meal. PCP NEED TO REFILL NEXT. What changed:  additional instructions   levothyroxine 50 MCG tablet Commonly known as: SYNTHROID Take 1 tablet (50 mcg total) by mouth daily.   loratadine 10 MG tablet Commonly known as: CLARITIN Take 10 mg by mouth daily.   metoprolol tartrate 25 MG tablet Commonly known as: LOPRESSOR Take 0.5 tablets (12.5 mg total) by mouth 2 (two) times daily.   nicotine 21 mg/24hr patch Commonly known as: NICODERM CQ - dosed in mg/24 hours Place 1 patch (21 mg total) onto the skin daily. Start taking on: December 08, 2019   nitroGLYCERIN 0.4 MG SL tablet Commonly known as: NITROSTAT PLACE 1 TABLET (0.4 MG TOTAL) UNDER THE TONGUE EVERY 5 (FIVE) MINUTES times 3 then call MD What changed:   how much to take  how to take this  when to take this  reasons to take this  additional instructions   pantoprazole 40 MG tablet Commonly known as: PROTONIX Take 1 tablet (40 mg total) by mouth daily. Start taking on: December 08, 2019   ranolazine 1000 MG SR tablet Commonly known as: RANEXA TAKE 1 TABLET BY MOUTH 2 TIMES DAILY. NEED OV. What changed:  how much to take  how to take this  when to take this  additional instructions   rosuvastatin 20 MG tablet Commonly known as: CRESTOR Take 1 tablet (20 mg total) by mouth daily.   sertraline 100 MG tablet Commonly known as: ZOLOFT Take 2 and 1/2 tablets by mouth daily What changed:   how much to take  how to take this  when to take this  additional instructions            Discharge Care Instructions  (From admission, onward)         Start     Ordered   12/07/19 0000  Change dressing (specify)       Comments: Wound care as per Dr Oneida Alar.   12/07/19 1332          Follow-up Information    Waynetta Sandy, MD Follow up in 2 week(s).   Specialties: Vascular Surgery, Cardiology Why: office will call Contact information: [place] [address] [phone]        Horald Pollen, MD. Schedule an  appointment as soon as possible for a visit in 1 week(s).   Specialty: Internal Medicine Contact information: [address] [place] [address] [phone]        Troy Sine, MD .   Specialty: Cardiology Contact information: [address] [place] [address] [phone]              Allergies  Allergen Reactions  . Coreg [Carvedilol] Nausea And Vomiting and Other (See Comments)    Per patient made him dizzy and light sensitive  . Sulfa Antibiotics Nausea And Vomiting and Other (See Comments)    Also headaches     Consultations:  Vascular surgery.    Procedures/Studies: PERIPHERAL VASCULAR CATHETERIZATION  Result Date: 11/28/2019 Patient name: JAYKO VOORHEES MRN: [id number] DOB: September 07, 1950 Sex: male 11/28/2019 Pre-operative Diagnosis: Critical bilateral lower extremity ischemia with wounds Post-operative diagnosis:  Same Surgeon:  Eda Paschal. Donzetta Matters, MD Procedure Performed: 1.  Ultrasound-guided cannulation left common femoral artery 2.  Aortogram with bilateral lower extremity runoff 3.  Chocolate balloon angioplasty of right common femoral artery with 6 x 40 mm balloon 4.  Stent of the left left external iliac artery with 6 x 40 mm Innova Indications: 69 year old male with history of right first and second toe amputation now has recurrent wounds there.  Has a history of a right femoral to popliteal artery bypass after previous revascularization of his right common femoral artery after cardiac catheterization.  On the left side he has had monophasic waveforms decreased ABIs but has not had pain.  He now has ulceration of the left foot including the left lateral foot left great toe and heel.  These are minor he is now indicated for angiography with possible invention likely bypass surgery. Findings: We difficulty accessing the left common femoral artery given its diminutive size.  There was a dissection we ultimately stented left external iliac artery with 6  x 40 Innova.  The aorta iliac segments do not have any flow-limiting stenosis but are heavily diseased.  On the right side the common femoral artery had 80% stenosis which was reduced to 0% stenosis after chocolate balloon angioplasty the bypass is patent and he has runoff via the anterior tibial and peroneal arteries.  On the left side after stenting we have good inflow to the left common femoral artery.  The SFA is quite diminutive in frankly occludes appears in the  mid thigh reconstitutes and above-knee popliteal artery which occludes at the level of the anterior tibial artery and he reconstitutes the peroneal artery in the mid calf. Patient will be scheduled for left common femoral to peroneal artery bypass.  Procedure:  The patient was identified in the holding area and taken to room 8.  The patient was then placed supine on the table and prepped and draped in the usual sterile fashion.  A time out was called.  Ultrasound was used to evaluate the left common femoral artery.  I could actually not pass micropuncture wire very far.  We did save an image department record.  I placed a micropuncture sheath used a Bentson wire was able to get this in part of the placed a 5 French sheath halfway.  I then performed retrograde angiography this demonstrated dissection.  I used a bare catheter and Glidewire advantage was able to steer into a true luminal plane and put an Omni catheter level of T12 performed angiography.  There was demonstratable dissection of the left external iliac artery as well as tight stenosis of the right common femoral artery.  I then crossed the bifurcation with Omni catheter and Glidewire advantage.  Patient was fully heparinized I placed a long 6 French sheath into the right external iliac artery.  I then crossed the lesion with a V 18 wire into the right femoropopliteal bypass performed angiography of the right lower extremity where I could visualize the tibial arteries to be patent as well as  the bypass throughout his course.  I then performed topical balloon angioplasty with a 6 mm balloon at nominal pressure for 3 minutes.  Completion demonstrated no residual stenosis or dissection.  Satisfied I retracted my sheath to the common femoral artery on the left just above the cannulation site.  Perform retrograde angiography demonstrated our dissection.  This was then primarily stented over the V 18 wire with a 6 x 40 mm Innova.  This was dilated with 6 x 40 mm chocolate balloon.  Completion demonstrated no residual stenosis or dissection.  We then perform left lower extremity angiography we demonstrated peroneal artery is the dominant runoff in the mid calf.  Satisfied with this we removed our wire.  Sheath will be pulled in postoperative holding.  Patient will be scheduled for left common femoral to peroneal artery bypass tomorrow in the operating room. Contrast: 165cc Brandon C. Donzetta Matters, MD [facility] Office: [phone] Pager: [phone]  DG Chest Port 1 View  Result Date: 12/02/2019 CLINICAL DATA:  Leukocytosis EXAM: PORTABLE CHEST 1 VIEW COMPARISON:  Portable exam 0753 hours compared to 05/27/2019 FINDINGS: Normal heart size post CABG and coronary stenting. Mediastinal contours and pulmonary vascularity normal. Atherosclerotic calcification aorta. Minimal LEFT basilar atelectasis. Lungs otherwise clear. No pulmonary infiltrate, pleural effusion, or pneumothorax. Probable RIGHT nipple shadow unchanged since 07/25/2009 Bones demineralized. IMPRESSION: Post CABG and coronary stenting. LEFT basilar atelectasis without acute infiltrate. Electronically Signed   By: Lavonia Dana M.D.   On: 12/02/2019 08:30   DG Foot Complete Right  Result Date: 11/25/2019 CLINICAL DATA:  Right foot ulcer.  Evaluate for osteomyelitis EXAM: RIGHT FOOT COMPLETE - 3+ VIEW COMPARISON:  06/11/2019 FINDINGS: Prior 1st and 2nd toe transmetatarsal amputation. Stable appearance. Subluxation of the  3rd MTP joint medially. There is a fracture within the proximal phalanx of the 4th toe. This is new since prior study. No definite bone destruction seen to suggest osteomyelitis. IMPRESSION: First and 2nd toe amputation. Subluxation  of the 3rd MTP joint. Fracture at the 4th toe proximal phalanx. Electronically Signed   By: Rolm Baptise M.D.   On: 11/25/2019 18:50   VAS [place] ABI WITH/WO TBI  Result Date: 12/05/2019 LOWER EXTREMITY DOPPLER STUDY Indications: Post-Op.  Vascular Interventions: 11-29-19 S/P Left Bypass Graft Femoral-Peroneal                          06-02-19 RT Leg Bypass Graft Fem-Pop and Rt great toe                         amputation. Comparison Study: Last study done 11-27-19 Performing Technologist: Darlin Coco Supporting Technologist: Oda Cogan RDMS, RVT  Examination Guidelines: A complete evaluation includes at minimum, Doppler waveform signals and systolic blood pressure reading at the level of bilateral brachial, anterior tibial, and posterior tibial arteries, when vessel segments are accessible. Bilateral testing is considered an integral part of a complete examination. Photoelectric Plethysmograph (PPG) waveforms and toe systolic pressure readings are included as required and additional duplex testing as needed. Limited examinations for reoccurring indications may be performed as noted.  ABI Findings: +---------+------------------+-----+----------+----------------+ Right    Rt Pressure (mmHg)IndexWaveform  Comment          +---------+------------------+-----+----------+----------------+ Brachial 133                    triphasic                  +---------+------------------+-----+----------+----------------+ PTA      118               0.89 monophasic                 +---------+------------------+-----+----------+----------------+ DP       134               1.01 monophasic                 +---------+------------------+-----+----------+----------------+ Great  Toe                                 Rt toe amputated +---------+------------------+-----+----------+----------------+ +---------+------------------+-----+----------+-------+ Left     Lt Pressure (mmHg)IndexWaveform  Comment +---------+------------------+-----+----------+-------+ Brachial 132                    triphasic         +---------+------------------+-----+----------+-------+ PERO                            monophasic        +---------+------------------+-----+----------+-------+ DP       89                0.67 monophasic        +---------+------------------+-----+----------+-------+ Great Toe73                0.55                   +---------+------------------+-----+----------+-------+ +-------+-----------+-----------+------------+------------+ ABI/TBIToday's ABIToday's TBIPrevious ABIPrevious TBI +-------+-----------+-----------+------------+------------+ Right  1.0                   0.64                     +-------+-----------+-----------+------------+------------+ Left   0.67       0.55  0.29                     +-------+-----------+-----------+------------+------------+ Right ABIs appear increased compared to prior study on 11/27/2019. Left ABIs appear increased compared to prior study on 11/27/2019.  Summary: Right: Resting right ankle-brachial index is within normal range. No evidence of significant right lower extremity arterial disease. ABIs are unreliable. Left: Resting left ankle-brachial index indicates moderate left lower extremity arterial disease. The left toe-brachial index is abnormal.  *See table(s) above for measurements and observations.  Electronically signed by Curt Jews MD on 12/05/2019 at 5:36:23 PM.    Final    VAS [place] ABI WITH/WO TBI  Result Date: 11/27/2019 LOWER EXTREMITY DOPPLER STUDY Indications: Rest pain, and non healing left great toe wound. High Risk Factors: Hypertension, hyperlipidemia, Diabetes, current smoker.   Vascular Interventions: Right femoropopliteal bypass graft and right great toe                         amputation -06/02/19. Performing Technologist: Oda Cogan Chief Tech  Examination Guidelines: A complete evaluation includes at minimum, Doppler waveform signals and systolic blood pressure reading at the level of bilateral brachial, anterior tibial, and posterior tibial arteries, when vessel segments are accessible. Bilateral testing is considered an integral part of a complete examination. Photoelectric Plethysmograph (PPG) waveforms and toe systolic pressure readings are included as required and additional duplex testing as needed. Limited examinations for reoccurring indications may be performed as noted.  ABI Findings: +---------+------------------+-----+----------+--------+ Right    Rt Pressure (mmHg)IndexWaveform  Comment  +---------+------------------+-----+----------+--------+ Brachial 137                    triphasic          +---------+------------------+-----+----------+--------+ ATA      81                0.58 monophasic         +---------+------------------+-----+----------+--------+ PTA      89                0.64                    +---------+------------------+-----+----------+--------+ PERO                            monophasic         +---------+------------------+-----+----------+--------+ Great Toe                       Absent             +---------+------------------+-----+----------+--------+ +--------+------------------+-----+----------+-------+ Left    Lt Pressure (mmHg)IndexWaveform  Comment +--------+------------------+-----+----------+-------+ [id number]                    triphasic         +--------+------------------+-----+----------+-------+ ATA     0                 0.00 absent            +--------+------------------+-----+----------+-------+ PTA     0                 0.00 absent             +--------+------------------+-----+----------+-------+ PERO    41                0.29 monophasic        +--------+------------------+-----+----------+-------+ +-------+-----------+-----------+------------+------------+  ABI/TBIToday's ABIToday's TBIPrevious ABIPrevious TBI +-------+-----------+-----------+------------+------------+ Right  0.64       N/A        0.85        N/A          +-------+-----------+-----------+------------+------------+ Left   0.29       N/A        0.24        N/A          +-------+-----------+-----------+------------+------------+ Right ABIs appear decreased compared to prior study on 07/29/19. Left ABIs appear essentially unchanged compared to prior study on 07/29/19.  Summary: Right: Resting right ankle-brachial index indicates moderate right lower extremity arterial disease. Left: Resting left ankle-brachial index indicates critical left limb ischemia.  *See table(s) above for measurements and observations.  Electronically signed by Ruta Hinds MD on 11/27/2019 at 7:12:01 PM.    Final    VAS [place] LOWER EXTREMITY ARTERIAL DUPLEX  Result Date: 11/27/2019 LOWER EXTREMITY ARTERIAL DUPLEX STUDY Indications: Rest pain, and ulceration. High Risk Factors: Hypertension, hyperlipidemia, Diabetes, current smoker,                    coronary artery disease.  Vascular Interventions: Right femoropopliteal bypass graft and right great toe                         amputation 06/02/19. Current ABI:            Right 0.26, left 0.29 Performing Technologist: Oda Cogan Chief Tech  Examination Guidelines: A complete evaluation includes B-mode imaging, spectral Doppler, color Doppler, and power Doppler as needed of all accessible portions of each vessel. Bilateral testing is considered an integral part of a complete examination. Limited examinations for reoccurring indications may be performed as noted.  +-----------+--------+-----+--------+----------+--------+ RIGHT      PSV  cm/sRatioStenosisWaveform  Comments +-----------+--------+-----+--------+----------+--------+ ATA Distal 62                   monophasic         +-----------+--------+-----+--------+----------+--------+ PTA Prox                occluded                   +-----------+--------+-----+--------+----------+--------+ PTA Mid                 occluded                   +-----------+--------+-----+--------+----------+--------+ PTA Distal 12                                      +-----------+--------+-----+--------+----------+--------+ PERO Distal62                   monophasic         +-----------+--------+-----+--------+----------+--------+  Right Graft #1: +------------------+--------+--------+----------+--------+                   PSV cm/sStenosisWaveform  Comments +------------------+--------+--------+----------+--------+ Inflow            86              monophasic         +------------------+--------+--------+----------+--------+ Prox Anastomosis  117             monophasic         +------------------+--------+--------+----------+--------+ Proximal Graft    52  monophasic         +------------------+--------+--------+----------+--------+ Mid Graft         45              monophasic         +------------------+--------+--------+----------+--------+ Distal Graft      55              monophasic         +------------------+--------+--------+----------+--------+ Distal Anastomosis56              monophasic         +------------------+--------+--------+----------+--------+ Outflow           141             monophasic         +------------------+--------+--------+----------+--------+ Patent graft with monophasic flow throughout.  +-----------+--------+-----+--------+----------+-----------------+ LEFT       PSV cm/sRatioStenosisWaveform  Comments          +-----------+--------+-----+--------+----------+-----------------+  CFA Distal 118                  monophasic                  +-----------+--------+-----+--------+----------+-----------------+ DFA        153                  monophasic                  +-----------+--------+-----+--------+----------+-----------------+ SFA Prox   65                   monophasic                  +-----------+--------+-----+--------+----------+-----------------+ SFA Mid    62                   monophasic                  +-----------+--------+-----+--------+----------+-----------------+ SFA Distal 51                   monophasic                  +-----------+--------+-----+--------+----------+-----------------+ POP Prox                        monophasic                  +-----------+--------+-----+--------+----------+-----------------+ POP Distal 57                   monophasic                  +-----------+--------+-----+--------+----------+-----------------+ ATA Prox                occluded                            +-----------+--------+-----+--------+----------+-----------------+ ATA Mid                 occluded                            +-----------+--------+-----+--------+----------+-----------------+ ATA Distal              occluded                            +-----------+--------+-----+--------+----------+-----------------+ PTA Prox   26  monophasic                  +-----------+--------+-----+--------+----------+-----------------+ PTA Mid    0            occluded                            +-----------+--------+-----+--------+----------+-----------------+ PTA Distal 0            occluded                            +-----------+--------+-----+--------+----------+-----------------+ PERO Mid   22                   monophasicseverely dampened +-----------+--------+-----+--------+----------+-----------------+ PERO Distal20                   monophasicseverly dampened   +-----------+--------+-----+--------+----------+-----------------+  Summary: Right: Right 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 Left: Left common femoral artery, superficial femoral artery, profunda femoral artery and popliteal artery are patent with monophasic flow without significant stenosis. The posterior tibial and anterior tibial arteries appears occluded. Peroneal artery appears patent with severly dampened flow.  See table(s) above for measurements and observations. Electronically signed by Ruta Hinds MD on 11/27/2019 at 7:11:53 PM.    Final        Subjective: No new complaints.   Discharge Exam: Vitals:   12/07/19 0808 12/07/19 1115  BP: (!) 100/58 102/61  Pulse: 65 82  Resp: 19 17  Temp: 97.6 F (36.4 C) 97.7 F (36.5 C)  SpO2: 98% 94%   Vitals:   12/07/19 0016 12/07/19 0444 12/07/19 0808 12/07/19 1115  BP:  (!) 95/40 (!) 100/58 102/61  Pulse:  61 65 82  Resp:  17 19 17   Temp: 98.7 F (37.1 C) 97.8 F (36.6 C) 97.6 F (36.4 C) 97.7 F (36.5 C)  TempSrc: Oral Oral Oral Oral  SpO2:  96% 98% 94%  Weight:      Height:        General: Pt is alert, awake, not in acute distress Cardiovascular: RRR, S1/S2 +, no rubs, no gallops Respiratory: CTA bilaterally, no wheezing, no rhonchi Abdominal: Soft, NT, ND, bowel sounds + Extremities: NO CYANOSIS.     The results of significant diagnostics from this hospitalization (including imaging, microbiology, ancillary and laboratory) are listed below for reference.     Microbiology: Recent Results (from the past 240 hour(s))  Surgical pcr screen     Status: Abnormal   Collection Time: 11/28/19 11:12 PM   Specimen: Nasal Mucosa; Nasal Swab  Result Value Ref Range Status   MRSA, PCR NEGATIVE NEGATIVE Final   Staphylococcus aureus POSITIVE (A) NEGATIVE Final    Comment: CRITICAL RESULT CALLED TO, READ BACK BY AND VERIFIED WITH: Jola Schmidt [id number] @0139  THANEY Performed at  [facility] [address], [place], [place] [address]      Labs: BNP (last 3 results) No results for input(s): BNP in the last 8760 hours. Basic Metabolic Panel: Recent Labs  Lab 12/03/19 0338 12/04/19 0517 12/05/19 0322 12/06/19 0249 12/07/19 0330  NA 130* 127* 130* 133* 129*  K 4.8 4.7 4.4 4.4 5.0  CL 98 97* 100 100 99  CO2 22 23 20* 22 22  GLUCOSE 166* 152* 129* 151* 147*  BUN 19 16 11 10 11   CREATININE 1.19 1.17 1.05 1.26* 1.48*  CALCIUM 8.6*  8.7* 8.6* 9.1 8.9   Liver Function Tests: No results for input(s): AST, ALT, ALKPHOS, BILITOT, PROT, ALBUMIN in the last 168 hours. No results for input(s): LIPASE, AMYLASE in the last 168 hours. No results for input(s): AMMONIA in the last 168 hours. CBC: Recent Labs  Lab 12/01/19 0254 12/02/19 0423 12/03/19 0338 12/04/19 0517 12/07/19 0330  WBC 11.7* 12.3* 10.1 8.3 9.7  NEUTROABS 8.9* 8.9* 7.3 5.6 5.8  HGB 9.0* 9.0* 8.9* 8.8* 9.4*  HCT 26.8* 27.8* 27.2* 26.5* 28.6*  MCV 95.7 98.6 97.1 95.7 97.6  PLT 207 221 239 223 283   Cardiac Enzymes: No results for input(s): CKTOTAL, CKMB, CKMBINDEX, TROPONINI in the last 168 hours. BNP: Invalid input(s): POCBNP CBG: Recent Labs  Lab 12/06/19 1110 12/06/19 1623 12/06/19 2139 12/07/19 0609 12/07/19 1113  GLUCAP 159* 138* 152* 119* 171*   D-Dimer No results for input(s): DDIMER in the last 72 hours. Hgb A1c No results for input(s): HGBA1C in the last 72 hours. Lipid Profile No results for input(s): CHOL, HDL, LDLCALC, TRIG, CHOLHDL, LDLDIRECT in the last 72 hours. Thyroid function studies No results for input(s): TSH, T4TOTAL, T3FREE, THYROIDAB in the last 72 hours.  Invalid input(s): FREET3 Anemia work up No results for input(s): VITAMINB12, FOLATE, FERRITIN, TIBC, IRON, RETICCTPCT in the last 72 hours. Urinalysis    Component Value Date/Time   COLORURINE YELLOW 11/28/2019 2109   APPEARANCEUR CLEAR 11/28/2019 2109   LABSPEC >1.046 (H) 11/28/2019 2109    PHURINE 6.0 11/28/2019 2109   GLUCOSEU NEGATIVE 11/28/2019 2109   HGBUR SMALL (A) 11/28/2019 2109   BILIRUBINUR NEGATIVE 11/28/2019 2109   KETONESUR NEGATIVE 11/28/2019 2109   PROTEINUR NEGATIVE 11/28/2019 2109   UROBILINOGEN 0.2 07/08/2013 0226   NITRITE NEGATIVE 11/28/2019 2109   LEUKOCYTESUR NEGATIVE 11/28/2019 2109   Sepsis Labs Invalid input(s): PROCALCITONIN,  WBC,  LACTICIDVEN Microbiology Recent Results (from the past 240 hour(s))  Surgical pcr screen     Status: Abnormal   Collection Time: 11/28/19 11:12 PM   Specimen: Nasal Mucosa; Nasal Swab  Result Value Ref Range Status   MRSA, PCR NEGATIVE NEGATIVE Final   Staphylococcus aureus POSITIVE (A) NEGATIVE Final    Comment: CRITICAL RESULT CALLED TO, READ BACK BY AND VERIFIED WITH: Jola Schmidt [id number] @0139  THANEY Performed at [facility] [address], [place], [place] [address]      Time coordinating discharge: 61 MINUTES SIGNED:   Hosie Poisson, MD  [facility] 12/07/2019, 1:33 PM

## 2019-12-07 NOTE — TOC Initial Note (Signed)
Transition of Care [facility]) - Initial/Assessment Note    Patient Details  Name: Jason [name] MRN: [id number] Date of Birth: 10/23/1950  [facility]) CM/SW Contact:    Vinie Sill, [place] Phone Number: 12/07/2019, 4:18 PM  Clinical Narrative:                  [facility]/SNF can admit patient tomorrow morning.   MD updated  Thurmond Butts, MSW, [place] Clinical Social Worker   Expected Discharge Plan: Skilled Nursing Facility Barriers to Discharge: Insurance Authorization, SNF Pending bed offer   Patient Goals and CMS Choice        Expected Discharge Plan and Services Expected Discharge Plan: [place] In-house Referral: Clinical Social Work     Living arrangements for the past 2 months: Single Family Home Expected Discharge Date: 12/07/19                                    Prior Living Arrangements/Services Living arrangements for the past 2 months: Single Family Home Lives with:: Self Patient language and need for interpreter reviewed:: No        Need for Family Participation in Patient Care: No (Comment) Care giver support system in place?: Yes (comment)   Criminal Activity/Legal Involvement Pertinent to Current Situation/Hospitalization: No - Comment as needed  Activities of Daily Living Home Assistive Devices/Equipment: None ADL Screening (condition at time of admission) Patient's cognitive ability adequate to safely complete daily activities?: Yes Is the patient deaf or have difficulty hearing?: No Does the patient have difficulty seeing, even when wearing glasses/contacts?: No Does the patient have difficulty concentrating, remembering, or making decisions?: No Patient able to express need for assistance with ADLs?: Yes Does the patient have difficulty dressing or bathing?: No Independently performs ADLs?: No Walks in Home: Needs assistance Does the patient have difficulty walking or climbing stairs?:  Yes Weakness of Legs: Both Weakness of Arms/Hands: Right  Permission Sought/Granted Permission sought to share information with : Family Supports Permission granted to share information with : Yes, Verbal Permission Granted  Share Information with NAME: Emir Nack  Permission granted to share info w AGENCY: SNFs  Permission granted to share info w Relationship: brother  Permission granted to share info w Contact Information: [phone]  Emotional Assessment Appearance:: Appears stated age Attitude/Demeanor/Rapport: Engaged Affect (typically observed): Accepting, Appropriate Orientation: : Oriented to Self, Oriented to Place, Oriented to  Time, Oriented to Situation Alcohol / Substance Use: Not Applicable Psych Involvement: No (comment)  Admission diagnosis:  PVD (peripheral vascular disease) ([place]) [I73.9] PAD (peripheral artery disease) ([place]) [I73.9] Claudication of both lower extremities (HCC) [I73.9] Critical lower limb ischemia [I99.8] Patient Active Problem List   Diagnosis Date Noted  . Pressure injury of skin 12/04/2019  . Critical lower limb ischemia 11/26/2019  . Wound of lower extremity 11/26/2019  . Toe fracture, right 11/26/2019  . Amputation of right great toe ([place]) 07/11/2019  . Nicotine abuse 06/12/2019  . Cellulitis of right foot 06/11/2019  . Diabetic foot infection ([place]) 06/11/2019  . PAD (peripheral artery disease) ([place]) 06/11/2019  . Cellulitis 05/27/2019  . Gangrene of toe ([place]) 05/27/2019  . Status post lumbar spinal fusion 10/27/2017  . Acute on chronic combined systolic and diastolic CHF (congestive heart failure) ([place]) 10/25/2016  . NSTEMI (non-ST elevated myocardial infarction) ([place]) 10/22/2016  . Hypothyroidism 08/17/2015  . RBBB 05/11/2015  . Hyponatremia 04/04/2015  .  Hypotension 04/04/2015  . Hemispheric carotid artery syndrome   . Carotid artery narrowing 03/28/2015  . Carotid stenosis- moderate 2011, 95% 2016 s/p stent 02/21/2014  .  Unstable angina ([place]) 01/16/2014  . Tobacco abuse 01/16/2014  . Substance abuse in remission ([place]) 10/01/2012  . Radiculitis 02/10/2012  . CAD, CABG Feb 2011, cath x 4 since-medical Rx 10/03/2011  . PVD, hx Rt femoral endarterectomy 2004 10/03/2011  . DM (diabetes mellitus) ([place]) 10/02/2011  . HTN (hypertension) 10/02/2011  . Hyperlipidemia 10/02/2011   PCP:  Horald Pollen, MD Pharmacy:   [facility], [address] [place] [address] Phone: [phone] Fax: [phone]  [id number] [place], [address] [place] [address] Phone: [phone] Fax: [phone]     Social Determinants of Health (SDOH) Interventions    Readmission Risk Interventions No flowsheet data found.

## 2019-12-07 NOTE — Progress Notes (Addendum)
  Progress Note    12/07/2019 7:25 AM 8 Days Post-Op  Subjective:  No new complaints   Vitals:   12/07/19 0016 12/07/19 0444  BP:  (!) 95/40  Pulse:  61  Resp:  17  Temp: 98.7 F (37.1 C) 97.8 F (36.6 C)  SpO2:  96%   Physical Exam: Lungs:  Non labored Incisions:  Some serous drainage from distal most incision however no dehiscence; all other incisions c/d/i; pitting edema LLE up to knee Extremities:  Brisk DP, soft peroneal and PT by doppler Neurologic: A&O  CBC    Component Value Date/Time   WBC 9.7 12/07/2019 0330   RBC 2.93 (L) 12/07/2019 0330   HGB 9.4 (L) 12/07/2019 0330   HCT 28.6 (L) 12/07/2019 0330   PLT 283 12/07/2019 0330   MCV 97.6 12/07/2019 0330   MCV 96.7 12/23/2016 1549   MCH 32.1 12/07/2019 0330   MCHC 32.9 12/07/2019 0330   RDW 13.6 12/07/2019 0330   LYMPHSABS 2.2 12/07/2019 0330   MONOABS 1.0 12/07/2019 0330   EOSABS 0.3 12/07/2019 0330   BASOSABS 0.1 12/07/2019 0330    BMET    Component Value Date/Time   NA 129 (L) 12/07/2019 0330   NA 131 (L) 09/30/2017 1629   K 5.0 12/07/2019 0330   CL 99 12/07/2019 0330   CO2 22 12/07/2019 0330   GLUCOSE 147 (H) 12/07/2019 0330   BUN 11 12/07/2019 0330   BUN 13 09/30/2017 1629   CREATININE 1.48 (H) 12/07/2019 0330   CREATININE 1.14 07/12/2015 1220   CALCIUM 8.9 12/07/2019 0330   GFRNONAA 48 (L) 12/07/2019 0330   GFRNONAA 69 07/12/2015 1220   GFRAA 56 (L) 12/07/2019 0330   GFRAA 79 07/12/2015 1220    INR    Component Value Date/Time   INR 1.1 11/28/2019 2352     Intake/Output Summary (Last 24 hours) at 12/07/2019 0725 Last data filed at 12/06/2019 1826 Gross per 24 hour  Intake 640 ml  Output --  Net 640 ml     Assessment/Plan:  69 y.o. male is s/p L CFA endart and femoral to peroneal bypass with vein 8 Days Post-Op   L foot well perfused Elevate leg on pillows when in bed Ok for discharge when bed confirmed and insurance approved; vascular will continue to follow  intermittently   Dagoberto Ligas, PA-C [facility] [phone] 12/07/2019 7:25 AM   I have independently interviewed and examined patient and agree with PA assessment and plan above.   Celestial Barnfield C. Donzetta Matters, MD [facility] [place] Office: [phone] Pager: [phone]

## 2019-12-07 NOTE — TOC Progression Note (Signed)
Transition of Care [facility]) - Progression Note    Patient Details  Name: KOLETON DUCHEMIN MRN: [id number] Date of Birth: [birth year]  Transition of [facility]) CM/SW [place], [place] Phone Number: 12/07/2019, 10:20 AM  Clinical Narrative:     Insurance authorization for SNF still pending refer # [id number]-   Thurmond Butts, MSW, [place] Clinical Social Worker   Expected Discharge Plan: Skilled Nursing Facility Barriers to Discharge: Ship broker, SNF Pending bed offer  Expected Discharge Plan and Services Expected Discharge Plan: [employer] In-house Referral: Clinical Social Work     Living arrangements for the past 2 months: Single Family Home                                       Social Determinants of Health (SDOH) Interventions    Readmission Risk Interventions No flowsheet data found.

## 2019-12-08 LAB — BASIC METABOLIC PANEL
Anion gap: 8 (ref 5–15)
BUN: 13 mg/dL (ref 8–23)
CO2: 23 mmol/L (ref 22–32)
Calcium: 9 mg/dL (ref 8.9–10.3)
Chloride: 97 mmol/L — ABNORMAL LOW (ref 98–111)
Creatinine, Ser: 1.29 mg/dL — ABNORMAL HIGH (ref 0.61–1.24)
GFR calc Af Amer: >60 mL/min (ref >60)
GFR calc non Af Amer: 57 mL/min — ABNORMAL LOW (ref >60)
Glucose, Bld: 157 mg/dL — ABNORMAL HIGH (ref 70–99)
Potassium: 4.8 mmol/L (ref 3.5–5.1)
Sodium: 128 mmol/L — ABNORMAL LOW (ref 135–145)

## 2019-12-08 LAB — GLUCOSE, CAPILLARY
Glucose-Capillary: 122 mg/dL — ABNORMAL HIGH (ref 70–99)
Glucose-Capillary: 129 mg/dL — ABNORMAL HIGH (ref 70–99)

## 2019-12-08 NOTE — Plan of Care (Signed)
Discharge to [facility]

## 2019-12-08 NOTE — Plan of Care (Signed)
Continue to monitor

## 2019-12-08 NOTE — Care Management Important Message (Signed)
Important Message  Patient Details  Name: Jason [name] MRN: [id number] Date of Birth: 12-27-1950   Medicare Important Message Given:  Yes     Shelda Altes 12/08/2019, 8:15 AM

## 2019-12-08 NOTE — Progress Notes (Signed)
Report called to [place], LPN at [facility].

## 2019-12-08 NOTE — Progress Notes (Signed)
Mobility Specialist: Progress Note   12/08/19 1437  Mobility  Activity Refused mobility  Mobility performed by Mobility specialist   Nurse said not to see pt b/c they are discharging soon.   [facility] Bunnie Lederman Mobility Specialist

## 2019-12-08 NOTE — TOC Transition Note (Signed)
Transition of Care [facility]) - CM/SW Discharge Note   Patient Details  Name: ISHAQ MAFFEI MRN: [id number] Date of Birth: 1950/12/17  Transition of Care [facility]) CM/SW Contact:  Vinie Sill, [place] Phone Number: 12/08/2019, 12:45 PM   Clinical Narrative:     Patient will DC to: [facility] Date: 12/08/19 Family Notified: Howard,brother Transport RN:[facility]   Per MD patient is ready for discharge. RN, patient, and facility notified of DC. Discharge Summary sent to facility. RN given number for [phone]. Ambulance transport requested for patient.   Clinical Social Worker signing off.  Thurmond Butts, MSW, [place] Clinical Social Worker   Final next level of care: Skilled Nursing Facility Barriers to Discharge: Barriers Resolved   Patient Goals and CMS Choice        Discharge Placement              Patient chooses bed at: [facility] Patient to be transferred to facility by: [place] Name of family member notified: Howard,brother Patient and family notified of of transfer: 12/08/19  Discharge Plan and Services In-house Referral: Clinical Social Work                                   Social Determinants of Health (SDOH) Interventions     Readmission Risk Interventions No flowsheet data found.

## 2019-12-08 NOTE — Discharge Summary (Signed)
Physician Discharge Summary  Jason [name] NUU:[id number] DOB: 18-Nov-1950 DOA: 11/25/2019  PCP: Horald Pollen, MD  Admit date: 11/25/2019 Discharge date: 12/08/2019  Admitted From: Home.  Disposition: SNF Recommendations for Outpatient Follow-up:  1. Follow up with PCP in 1-2 weeks 2. Please obtain BMP/CBC in 1 to 2 days.  Please follow up with vascular surgery as recommended.    Discharge Condition: STABLE  CODE STATUS: DNR Diet recommendation: Heart Healthy  Brief/Interim Summary: 69 y.o.malewith medical history significant forPVD s/p R fem-pop bypass, COPD, CAD s/p CABG, T2DM, HTN, HLD whopresents with worsening bilateral lower extremity pain for about 1 month, worse with ambulation and better at rest. Also noted left foot heel wound and also an ulcer on the plantar surface of the right foot. Patient continues to be a current smoker of at least 1 pack/day. In the ED, intermittently hypotensive with improvement with IV fluids. Lab work notable for elevated creatinine 1.29 from a prior of 1.18. Sodium of 129. Mild leukocytosis of 10.9. No signs of necrosis on feet but patient did not have palpable or doppler findings of posterior tibial or dorsalis pedis pulse on the left side. Right foot x-ray was negative for osteomyelitis but had incidental finding of fourth toe fracture. ABI in the ED showed left lower extremity of 0.68 and right foot 0.81. Last left ABI in February 2021 was 0.24. ED physician Dr. Rex Kras discussed case with vascular surgeon Dr. Oneida Alar. underwent US guided cannulation of L common femoral, aortogram with B LE runoff, angioplasty of R common femoral and stenting to the L external iliac on 6/7 and L common femoral endarterectomy with L common fem to peroneal artery bypass for critical LLE ischemia Patient is continued on aspirin and Plavix  Discharge Diagnoses:  Principal Problem:   Critical lower limb ischemia Active Problems:   DM (diabetes mellitus)  ([place])   CAD, CABG Feb 2011, cath x 4 since-medical Rx   PVD, hx Rt femoral endarterectomy 2004   Tobacco abuse   Hyponatremia   Hypotension   Hypothyroidism   Wound of lower extremity   Toe fracture, right   Pressure injury of skin  Bilateral lower extremity claudication/Left ischemic limbwith history of PVD ABI in the ED with left lower extremity of 0.68 and right foot 0.81. Last left ABI in February 2021 was 0.24.No palpable or Doppler findings of PT or DP pulses on left side Vascular on board, underwent US guided cannulation of L common femoral, aortogram with B LE runoff, angioplasty of R common femoral and stenting to the L external iliac on 6/7 and L common femoral endarterectomy with L common fem to peroneal artery bypass for critical LLE ischemia Patient is continued on aspirin and Plavix  Leukocytosis Resolved ?recative, afebrile, no signs of infection CXR without acute infiltrate Daily cbc  Left great toe nail, heel, lateral wound/ Right plantar ulcer wound at base of 4th MTP Wound care was consulted  Right 4th toe fracture Cont with rigid sole shoes for ambulation  Hypertension Initially hypotensive, currently stable Continued on metoprolol with hold parameters  AKI/metabolic acidosis Slight worsening of creatinine.  PO bicarb X 2 days . Recommend getting BMP in I to 2 days.    Chronic hyponatremia Ongoing for years Daily BMP  Iron def/Vit B12 def/anemia of chronic disease Hemoglobin slowing trending down Anemia panel showed iron 35, sats 10, Vit B12 137 S/P 1 dose of feraheme, Rush cyanocobalamin on 12/03/19, continue oral supplements   Tobacco abuse Continued on nicotine  patch  Type 2 diabetes mellitus SSI, accuchecks, hypoglycemic protocol  History of CAD s/p CABG Continue Ranexa, statin  Hypothyroidism Continue levothyroxine   Pressure Injury 12/04/19 Buttocks Right;Left;Mid Stage 1 -  Intact skin with non-blanchable redness of a  localized area usually over a bony prominence. redden (Active)  12/04/19 0600  Location: Buttocks  Location Orientation: Right;Left;Mid  Staging: Stage 1 -  Intact skin with non-blanchable redness of a localized area usually over a bony prominence.  Wound Description (Comments): redden  Present on Admission:    WOUND CARE consulted for recommendations.      Discharge Instructions  Discharge Instructions    Change dressing (specify)   Complete by: As directed    Wound care as per Dr Oneida Alar.   Diet - low sodium heart healthy   Complete by: As directed    Discharge instructions   Complete by: As directed    Please follow up with vascular surgery as recommended.     Allergies as of 12/08/2019      Reactions   Coreg [carvedilol] Nausea And Vomiting, Other (See Comments)   Per patient made him dizzy and light sensitive   Sulfa Antibiotics Nausea And Vomiting, Other (See Comments)   Also headaches      Medication List    STOP taking these medications   metoprolol succinate 25 MG 24 hr tablet Commonly known as: TOPROL-XL     TAKE these medications   acetaminophen 325 MG tablet Commonly known as: TYLENOL Take 650 mg by mouth every 6 (six) hours as needed for mild pain or headache.   aspirin 81 MG tablet Take 1 tablet (81 mg total) by mouth daily.   clopidogrel 75 MG tablet Commonly known as: PLAVIX Take 1 tablet (75 mg total) by mouth daily.   cyanocobalamin 1000 MCG tablet Take 1 tablet (1,000 mcg total) by mouth daily.   ferrous sulfate 325 (65 FE) MG tablet Take 1 tablet (325 mg total) by mouth daily with breakfast.   gabapentin 300 MG capsule Commonly known as: NEURONTIN Take 1 capsule (300 mg total) by mouth 2 (two) times daily.   Janumet 50-500 MG tablet Generic drug: sitaGLIPtin-metformin Take 1 tablet by mouth 2 (two) times daily with a meal. PCP NEED TO REFILL NEXT. What changed: additional instructions   levothyroxine 50 MCG tablet Commonly known  as: SYNTHROID Take 1 tablet (50 mcg total) by mouth daily.   loratadine 10 MG tablet Commonly known as: CLARITIN Take 10 mg by mouth daily.   metoprolol tartrate 25 MG tablet Commonly known as: LOPRESSOR Take 0.5 tablets (12.5 mg total) by mouth 2 (two) times daily.   nicotine 21 mg/24hr patch Commonly known as: NICODERM CQ - dosed in mg/24 hours Place 1 patch (21 mg total) onto the skin daily.   nitroGLYCERIN 0.4 MG SL tablet Commonly known as: NITROSTAT PLACE 1 TABLET (0.4 MG TOTAL) UNDER THE TONGUE EVERY 5 (FIVE) MINUTES times 3 then call MD What changed:   how much to take  how to take this  when to take this  reasons to take this  additional instructions   pantoprazole 40 MG tablet Commonly known as: PROTONIX Take 1 tablet (40 mg total) by mouth daily.   ranolazine 1000 MG SR tablet Commonly known as: RANEXA TAKE 1 TABLET BY MOUTH 2 TIMES DAILY. NEED OV. What changed:   how much to take  how to take this  when to take this  additional instructions   rosuvastatin 20 MG  tablet Commonly known as: CRESTOR Take 1 tablet (20 mg total) by mouth daily.   sertraline 100 MG tablet Commonly known as: ZOLOFT Take 2 and 1/2 tablets by mouth daily What changed:   how much to take  how to take this  when to take this  additional instructions            Discharge Care Instructions  (From admission, onward)         Start     Ordered   12/07/19 0000  Change dressing (specify)       Comments: Wound care as per Dr Oneida Alar.   12/07/19 1332          Follow-up Information    Waynetta Sandy, MD Follow up in 2 week(s).   Specialties: Vascular Surgery, Cardiology Why: office will call Contact information: [place] [address] [phone]        Horald Pollen, MD. Schedule an appointment as soon as possible for a visit in 1 week(s).   Specialty: Internal Medicine Contact information: [address] [place]  [place] [address] [phone]        Troy Sine, MD .   Specialty: Cardiology Contact information: [address] [place] [address] [phone]              Allergies  Allergen Reactions  . Coreg [Carvedilol] Nausea And Vomiting and Other (See Comments)    Per patient made him dizzy and light sensitive  . Sulfa Antibiotics Nausea And Vomiting and Other (See Comments)    Also headaches     Consultations:  Vascular surgery.    Procedures/Studies: PERIPHERAL VASCULAR CATHETERIZATION  Result Date: 11/28/2019 Patient name: Jason [name] MRN: [id number] DOB: [birth year] Sex: male 11/28/2019 Pre-operative Diagnosis: Critical bilateral lower extremity ischemia with wounds Post-operative diagnosis:  Same Surgeon:  Eda Paschal. Donzetta Matters, MD Procedure Performed: 1.  Ultrasound-guided cannulation left common femoral artery 2.  Aortogram with bilateral lower extremity runoff 3.  Chocolate balloon angioplasty of right common femoral artery with 6 x 40 mm balloon 4.  Stent of the left left external iliac artery with 6 x 40 mm Innova Indications: 69 year old male with history of right first and second toe amputation now has recurrent wounds there.  Has a history of a right femoral to popliteal artery bypass after previous revascularization of his right common femoral artery after cardiac catheterization.  On the left side he has had monophasic waveforms decreased ABIs but has not had pain.  He now has ulceration of the left foot including the left lateral foot left great toe and heel.  These are minor he is now indicated for angiography with possible invention likely bypass surgery. Findings: We difficulty accessing the left common femoral artery given its diminutive size.  There was a dissection we ultimately stented left external iliac artery with 6 x 40 Innova.  The aorta iliac segments do not have any flow-limiting stenosis but are heavily diseased.  On the right side the common  femoral artery had 80% stenosis which was reduced to 0% stenosis after chocolate balloon angioplasty the bypass is patent and he has runoff via the anterior tibial and peroneal arteries.  On the left side after stenting we have good inflow to the left common femoral artery.  The SFA is quite diminutive in frankly occludes appears in the mid thigh reconstitutes and above-knee popliteal artery which occludes at the level of the anterior tibial artery and he reconstitutes the peroneal  artery in the mid calf. Patient will be scheduled for left common femoral to peroneal artery bypass.  Procedure:  The patient was identified in the holding area and taken to room 8.  The patient was then placed supine on the table and prepped and draped in the usual sterile fashion.  A time out was called.  Ultrasound was used to evaluate the left common femoral artery.  I could actually not pass micropuncture wire very far.  We did save an image department record.  I placed a micropuncture sheath used a Bentson wire was able to get this in part of the placed a 5 French sheath halfway.  I then performed retrograde angiography this demonstrated dissection.  I used a bare catheter and Glidewire advantage was able to steer into a true luminal plane and put an Omni catheter level of T12 performed angiography.  There was demonstratable dissection of the left external iliac artery as well as tight stenosis of the right common femoral artery.  I then crossed the bifurcation with Omni catheter and Glidewire advantage.  Patient was fully heparinized I placed a long 6 French sheath into the right external iliac artery.  I then crossed the lesion with a V 18 wire into the right femoropopliteal bypass performed angiography of the right lower extremity where I could visualize the tibial arteries to be patent as well as the bypass throughout his course.  I then performed topical balloon angioplasty with a 6 mm balloon at nominal pressure for 3 minutes.   Completion demonstrated no residual stenosis or dissection.  Satisfied I retracted my sheath to the common femoral artery on the left just above the cannulation site.  Perform retrograde angiography demonstrated our dissection.  This was then primarily stented over the V 18 wire with a 6 x 40 mm Innova.  This was dilated with 6 x 40 mm chocolate balloon.  Completion demonstrated no residual stenosis or dissection.  We then perform left lower extremity angiography we demonstrated peroneal artery is the dominant runoff in the mid calf.  Satisfied with this we removed our wire.  Sheath will be pulled in postoperative holding.  Patient will be scheduled for left common femoral to peroneal artery bypass tomorrow in the operating room. Contrast: 165cc Brandon C. Donzetta Matters, MD [facility] Office: [phone] Pager: [phone]  DG Chest Port 1 View  Result Date: 12/02/2019 CLINICAL DATA:  Leukocytosis EXAM: PORTABLE CHEST 1 VIEW COMPARISON:  Portable exam 0753 hours compared to 05/27/2019 FINDINGS: Normal heart size post CABG and coronary stenting. Mediastinal contours and pulmonary vascularity normal. Atherosclerotic calcification aorta. Minimal LEFT basilar atelectasis. Lungs otherwise clear. No pulmonary infiltrate, pleural effusion, or pneumothorax. Probable RIGHT nipple shadow unchanged since 07/25/2009 Bones demineralized. IMPRESSION: Post CABG and coronary stenting. LEFT basilar atelectasis without acute infiltrate. Electronically Signed   By: Lavonia Dana M.D.   On: 12/02/2019 08:30   DG Foot Complete Right  Result Date: 11/25/2019 CLINICAL DATA:  Right foot ulcer.  Evaluate for osteomyelitis EXAM: RIGHT FOOT COMPLETE - 3+ VIEW COMPARISON:  06/11/2019 FINDINGS: Prior 1st and 2nd toe transmetatarsal amputation. Stable appearance. Subluxation of the 3rd MTP joint medially. There is a fracture within the proximal phalanx of the 4th toe. This is new since prior study. No definite  bone destruction seen to suggest osteomyelitis. IMPRESSION: First and 2nd toe amputation. Subluxation of the 3rd MTP joint. Fracture at the 4th toe proximal phalanx. Electronically Signed   By: Rolm Baptise M.D.  On: 11/25/2019 18:50   VAS [place] ABI WITH/WO TBI  Result Date: 12/05/2019 LOWER EXTREMITY DOPPLER STUDY Indications: Post-Op.  Vascular Interventions: 11-29-19 S/P Left Bypass Graft Femoral-Peroneal                          06-02-19 RT Leg Bypass Graft Fem-Pop and Rt great toe                         amputation. Comparison Study: Last study done 11-27-19 Performing Technologist: Darlin Coco Supporting Technologist: Oda Cogan RDMS, RVT  Examination Guidelines: A complete evaluation includes at minimum, Doppler waveform signals and systolic blood pressure reading at the level of bilateral brachial, anterior tibial, and posterior tibial arteries, when vessel segments are accessible. Bilateral testing is considered an integral part of a complete examination. Photoelectric Plethysmograph (PPG) waveforms and toe systolic pressure readings are included as required and additional duplex testing as needed. Limited examinations for reoccurring indications may be performed as noted.  ABI Findings: +---------+------------------+-----+----------+----------------+ Right    Rt Pressure (mmHg)IndexWaveform  Comment          +---------+------------------+-----+----------+----------------+ Brachial 133                    triphasic                  +---------+------------------+-----+----------+----------------+ PTA      118               0.89 monophasic                 +---------+------------------+-----+----------+----------------+ DP       134               1.01 monophasic                 +---------+------------------+-----+----------+----------------+ Great Toe                                 Rt toe amputated +---------+------------------+-----+----------+----------------+  +---------+------------------+-----+----------+-------+ Left     Lt Pressure (mmHg)IndexWaveform  Comment +---------+------------------+-----+----------+-------+ Brachial 132                    triphasic         +---------+------------------+-----+----------+-------+ PERO                            monophasic        +---------+------------------+-----+----------+-------+ DP       89                0.67 monophasic        +---------+------------------+-----+----------+-------+ Great Toe73                0.55                   +---------+------------------+-----+----------+-------+ +-------+-----------+-----------+------------+------------+ ABI/TBIToday's ABIToday's TBIPrevious ABIPrevious TBI +-------+-----------+-----------+------------+------------+ Right  1.0                   0.64                     +-------+-----------+-----------+------------+------------+ Left   0.67       0.55       0.29                     +-------+-----------+-----------+------------+------------+  Right ABIs appear increased compared to prior study on 11/27/2019. Left ABIs appear increased compared to prior study on 11/27/2019.  Summary: Right: Resting right ankle-brachial index is within normal range. No evidence of significant right lower extremity arterial disease. ABIs are unreliable. Left: Resting left ankle-brachial index indicates moderate left lower extremity arterial disease. The left toe-brachial index is abnormal.  *See table(s) above for measurements and observations.  Electronically signed by Curt Jews MD on 12/05/2019 at 5:36:23 PM.    Final    VAS [place] ABI WITH/WO TBI  Result Date: 11/27/2019 LOWER EXTREMITY DOPPLER STUDY Indications: Rest pain, and non healing left great toe wound. High Risk Factors: Hypertension, hyperlipidemia, Diabetes, current smoker.  Vascular Interventions: Right femoropopliteal bypass graft and right great toe                         amputation  -06/02/19. Performing Technologist: Oda Cogan Chief Tech  Examination Guidelines: A complete evaluation includes at minimum, Doppler waveform signals and systolic blood pressure reading at the level of bilateral brachial, anterior tibial, and posterior tibial arteries, when vessel segments are accessible. Bilateral testing is considered an integral part of a complete examination. Photoelectric Plethysmograph (PPG) waveforms and toe systolic pressure readings are included as required and additional duplex testing as needed. Limited examinations for reoccurring indications may be performed as noted.  ABI Findings: +---------+------------------+-----+----------+--------+ Right    Rt Pressure (mmHg)IndexWaveform  Comment  +---------+------------------+-----+----------+--------+ Brachial 137                    triphasic          +---------+------------------+-----+----------+--------+ ATA      81                0.58 monophasic         +---------+------------------+-----+----------+--------+ PTA      89                0.64                    +---------+------------------+-----+----------+--------+ PERO                            monophasic         +---------+------------------+-----+----------+--------+ Great Toe                       Absent             +---------+------------------+-----+----------+--------+ +--------+------------------+-----+----------+-------+ Left    Lt Pressure (mmHg)IndexWaveform  Comment +--------+------------------+-----+----------+-------+ [id number]                    triphasic         +--------+------------------+-----+----------+-------+ ATA     0                 0.00 absent            +--------+------------------+-----+----------+-------+ PTA     0                 0.00 absent            +--------+------------------+-----+----------+-------+ PERO    41                0.29 monophasic         +--------+------------------+-----+----------+-------+ +-------+-----------+-----------+------------+------------+ ABI/TBIToday's ABIToday's TBIPrevious ABIPrevious TBI +-------+-----------+-----------+------------+------------+ Right  0.64       N/A  0.85        N/A          +-------+-----------+-----------+------------+------------+ Left   0.29       N/A        0.24        N/A          +-------+-----------+-----------+------------+------------+ Right ABIs appear decreased compared to prior study on 07/29/19. Left ABIs appear essentially unchanged compared to prior study on 07/29/19.  Summary: Right: Resting right ankle-brachial index indicates moderate right lower extremity arterial disease. Left: Resting left ankle-brachial index indicates critical left limb ischemia.  *See table(s) above for measurements and observations.  Electronically signed by Ruta Hinds MD on 11/27/2019 at 7:12:01 PM.    Final    VAS [place] LOWER EXTREMITY ARTERIAL DUPLEX  Result Date: 11/27/2019 LOWER EXTREMITY ARTERIAL DUPLEX STUDY Indications: Rest pain, and ulceration. High Risk Factors: Hypertension, hyperlipidemia, Diabetes, current smoker,                    coronary artery disease.  Vascular Interventions: Right femoropopliteal bypass graft and right great toe                         amputation 06/02/19. Current ABI:            Right 0.26, left 0.29 Performing Technologist: Oda Cogan Chief Tech  Examination Guidelines: A complete evaluation includes B-mode imaging, spectral Doppler, color Doppler, and power Doppler as needed of all accessible portions of each vessel. Bilateral testing is considered an integral part of a complete examination. Limited examinations for reoccurring indications may be performed as noted.  +-----------+--------+-----+--------+----------+--------+ RIGHT      PSV cm/sRatioStenosisWaveform  Comments +-----------+--------+-----+--------+----------+--------+ ATA Distal 62                    monophasic         +-----------+--------+-----+--------+----------+--------+ PTA Prox                occluded                   +-----------+--------+-----+--------+----------+--------+ PTA Mid                 occluded                   +-----------+--------+-----+--------+----------+--------+ PTA Distal 12                                      +-----------+--------+-----+--------+----------+--------+ PERO Distal62                   monophasic         +-----------+--------+-----+--------+----------+--------+  Right Graft #1: +------------------+--------+--------+----------+--------+                   PSV cm/sStenosisWaveform  Comments +------------------+--------+--------+----------+--------+ Inflow            86              monophasic         +------------------+--------+--------+----------+--------+ Prox Anastomosis  117             monophasic         +------------------+--------+--------+----------+--------+ Proximal Graft    52              monophasic         +------------------+--------+--------+----------+--------+ Mid Graft  45              monophasic         +------------------+--------+--------+----------+--------+ Distal Graft      55              monophasic         +------------------+--------+--------+----------+--------+ Distal Anastomosis56              monophasic         +------------------+--------+--------+----------+--------+ Outflow           141             monophasic         +------------------+--------+--------+----------+--------+ Patent graft with monophasic flow throughout.  +-----------+--------+-----+--------+----------+-----------------+ LEFT       PSV cm/sRatioStenosisWaveform  Comments          +-----------+--------+-----+--------+----------+-----------------+ CFA Distal 118                  monophasic                   +-----------+--------+-----+--------+----------+-----------------+ DFA        153                  monophasic                  +-----------+--------+-----+--------+----------+-----------------+ SFA Prox   65                   monophasic                  +-----------+--------+-----+--------+----------+-----------------+ SFA Mid    62                   monophasic                  +-----------+--------+-----+--------+----------+-----------------+ SFA Distal 51                   monophasic                  +-----------+--------+-----+--------+----------+-----------------+ POP Prox                        monophasic                  +-----------+--------+-----+--------+----------+-----------------+ POP Distal 57                   monophasic                  +-----------+--------+-----+--------+----------+-----------------+ ATA Prox                occluded                            +-----------+--------+-----+--------+----------+-----------------+ ATA Mid                 occluded                            +-----------+--------+-----+--------+----------+-----------------+ ATA Distal              occluded                            +-----------+--------+-----+--------+----------+-----------------+ PTA Prox   26                   monophasic                  +-----------+--------+-----+--------+----------+-----------------+  PTA Mid    0            occluded                            +-----------+--------+-----+--------+----------+-----------------+ PTA Distal 0            occluded                            +-----------+--------+-----+--------+----------+-----------------+ PERO Mid   22                   monophasicseverely dampened +-----------+--------+-----+--------+----------+-----------------+ PERO Distal20                   monophasicseverly dampened  +-----------+--------+-----+--------+----------+-----------------+   Summary: Right: Right 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 Left: Left common femoral artery, superficial femoral artery, profunda femoral artery and popliteal artery are patent with monophasic flow without significant stenosis. The posterior tibial and anterior tibial arteries appears occluded. Peroneal artery appears patent with severly dampened flow.  See table(s) above for measurements and observations. Electronically signed by Ruta Hinds MD on 11/27/2019 at 7:11:53 PM.    Final       Subjective: No new complaints.   Discharge Exam: Vitals:   12/08/19 1019 12/08/19 1237  BP:  (!) 108/58  Pulse: 66 64  Resp:  18  Temp:  (!) 97.4 F (36.3 C)  SpO2:  100%   Vitals:   12/08/19 0740 12/08/19 1000 12/08/19 1019 12/08/19 1237  BP: (!) 100/56 (!) 105/59  (!) 108/58  Pulse: 64  66 64  Resp: 18   18  Temp: (!) 97.3 F (36.3 C)   (!) 97.4 F (36.3 C)  TempSrc: Oral   Oral  SpO2: 97%   100%  Weight:      Height:        General: Pt is alert, awake, not in acute distress Cardiovascular: RRR, S1/S2 +, no rubs, no gallops Respiratory: CTA bilaterally, no wheezing, no rhonchi Abdominal: Soft, NT, ND, bowel sounds + Extremities: NO CYANOSIS.     The results of significant diagnostics from this hospitalization (including imaging, microbiology, ancillary and laboratory) are listed below for reference.     Microbiology: Recent Results (from the past 240 hour(s))  Surgical pcr screen     Status: Abnormal   Collection Time: 11/28/19 11:12 PM   Specimen: Nasal Mucosa; Nasal Swab  Result Value Ref Range Status   MRSA, PCR NEGATIVE NEGATIVE Final   Staphylococcus aureus POSITIVE (A) NEGATIVE Final    Comment: CRITICAL RESULT CALLED TO, READ BACK BY AND VERIFIED WITH: Jola Schmidt [id number] @0139  THANEY Performed at [facility] [address] [place], [place] [address]   SARS Coronavirus 2 by RT PCR (hospital order, performed in [facility] hospital lab) Nasopharyngeal Nasopharyngeal Swab     Status: None   Collection Time: 12/07/19 11:44 AM   Specimen: Nasopharyngeal Swab  Result Value Ref Range Status   SARS Coronavirus 2 NEGATIVE NEGATIVE Final    Comment: (NOTE) SARS-CoV-2 target nucleic acids are NOT DETECTED.  The SARS-CoV-2 RNA is generally detectable in upper and lower respiratory specimens during the acute phase of infection. The lowest concentration of SARS-CoV-2 viral copies this assay can detect is 250 copies / mL. A negative result does not preclude SARS-CoV-2 infection and should not be used as the sole  basis for treatment or other patient management decisions.  A negative result may occur with improper specimen collection / handling, submission of specimen other than nasopharyngeal swab, presence of viral mutation(s) within the areas targeted by this assay, and inadequate number of viral copies (<250 copies / mL). A negative result must be combined with clinical observations, patient history, and epidemiological information.  Fact Sheet for Patients:   StrictlyIdeas.no  Fact Sheet for Healthcare Providers: BankingDealers.co.za  This test is not yet approved or  cleared by the [place] FDA and has been authorized for detection and/or diagnosis of SARS-CoV-2 by FDA under an Emergency Use Authorization (EUA).  This EUA will remain in effect (meaning this test can be used) for the duration of the COVID-19 declaration under Section 564(b)(1) of the Act, 21 U.S.C. section 360bbb-3(b)(1), unless the authorization is terminated or revoked sooner.  Performed at [facility], [place] [address], [place], [place] [address]      Labs: BNP (last 3 results) No results for input(s): BNP in the last 8760 hours. Basic Metabolic Panel: Recent Labs  Lab 12/04/19 0517 12/05/19 0322 12/06/19 0249 12/07/19 0330 12/08/19 0220  NA 127* 130* 133* 129*  128*  K 4.7 4.4 4.4 5.0 4.8  CL 97* 100 100 99 97*  CO2 23 20* 22 22 23   GLUCOSE 152* 129* 151* 147* 157*  BUN 16 11 10 11 13   CREATININE 1.17 1.05 1.26* 1.48* 1.29*  CALCIUM 8.7* 8.6* 9.1 8.9 9.0   Liver Function Tests: No results for input(s): AST, ALT, ALKPHOS, BILITOT, PROT, ALBUMIN in the last 168 hours. No results for input(s): LIPASE, AMYLASE in the last 168 hours. No results for input(s): AMMONIA in the last 168 hours. CBC: Recent Labs  Lab 12/02/19 0423 12/03/19 0338 12/04/19 0517 12/07/19 0330  WBC 12.3* 10.1 8.3 9.7  NEUTROABS 8.9* 7.3 5.6 5.8  HGB 9.0* 8.9* 8.8* 9.4*  HCT 27.8* 27.2* 26.5* 28.6*  MCV 98.6 97.1 95.7 97.6  PLT 221 239 223 283   Cardiac Enzymes: No results for input(s): CKTOTAL, CKMB, CKMBINDEX, TROPONINI in the last 168 hours. BNP: Invalid input(s): POCBNP CBG: Recent Labs  Lab 12/07/19 1113 12/07/19 1557 12/07/19 2048 12/08/19 0619 12/08/19 1137  GLUCAP 171* 188* 155* 122* 129*   D-Dimer No results for input(s): DDIMER in the last 72 hours. Hgb A1c No results for input(s): HGBA1C in the last 72 hours. Lipid Profile No results for input(s): CHOL, HDL, LDLCALC, TRIG, CHOLHDL, LDLDIRECT in the last 72 hours. Thyroid function studies No results for input(s): TSH, T4TOTAL, T3FREE, THYROIDAB in the last 72 hours.  Invalid input(s): FREET3 Anemia work up No results for input(s): VITAMINB12, FOLATE, FERRITIN, TIBC, IRON, RETICCTPCT in the last 72 hours. Urinalysis    Component Value Date/Time   COLORURINE YELLOW 11/28/2019 2109   APPEARANCEUR CLEAR 11/28/2019 2109   LABSPEC >1.046 (H) 11/28/2019 2109   PHURINE 6.0 11/28/2019 2109   GLUCOSEU NEGATIVE 11/28/2019 2109   HGBUR SMALL (A) 11/28/2019 2109   BILIRUBINUR NEGATIVE 11/28/2019 2109   KETONESUR NEGATIVE 11/28/2019 2109   PROTEINUR NEGATIVE 11/28/2019 2109   UROBILINOGEN 0.2 07/08/2013 0226   NITRITE NEGATIVE 11/28/2019 2109   LEUKOCYTESUR NEGATIVE 11/28/2019 2109   Sepsis  Labs Invalid input(s): PROCALCITONIN,  WBC,  LACTICIDVEN Microbiology Recent Results (from the past 240 hour(s))  Surgical pcr screen     Status: Abnormal   Collection Time: 11/28/19 11:12 PM   Specimen: Nasal Mucosa; Nasal Swab  Result Value Ref Range Status  MRSA, PCR NEGATIVE NEGATIVE Final   Staphylococcus aureus POSITIVE (A) NEGATIVE Final    Comment: CRITICAL RESULT CALLED TO, READ BACK BY AND VERIFIED WITH: Jola Schmidt [phone] @0139  THANEY Performed at [facility] [address] [place], [place] [address]   SARS Coronavirus 2 by RT PCR (hospital order, performed in [facility] hospital lab) Nasopharyngeal Nasopharyngeal Swab     Status: None   Collection Time: 12/07/19 11:44 AM   Specimen: Nasopharyngeal Swab  Result Value Ref Range Status   SARS Coronavirus 2 NEGATIVE NEGATIVE Final    Comment: (NOTE) SARS-CoV-2 target nucleic acids are NOT DETECTED.  The SARS-CoV-2 RNA is generally detectable in upper and lower respiratory specimens during the acute phase of infection. The lowest concentration of SARS-CoV-2 viral copies this assay can detect is 250 copies / mL. A negative result does not preclude SARS-CoV-2 infection and should not be used as the sole basis for treatment or other patient management decisions.  A negative result may occur with improper specimen collection / handling, submission of specimen other than nasopharyngeal swab, presence of viral mutation(s) within the areas targeted by this assay, and inadequate number of viral copies (<250 copies / mL). A negative result must be combined with clinical observations, patient history, and epidemiological information.  Fact Sheet for Patients:   StrictlyIdeas.no  Fact Sheet for Healthcare Providers: BankingDealers.co.za  This test is not yet approved or  cleared by the [place] FDA and has been authorized for detection and/or diagnosis of SARS-CoV-2  by FDA under an Emergency Use Authorization (EUA).  This EUA will remain in effect (meaning this test can be used) for the duration of the COVID-19 declaration under Section 564(b)(1) of the Act, 21 U.S.C. section 360bbb-3(b)(1), unless the authorization is terminated or revoked sooner.  Performed at [facility], [place] [address], [place],  [address]      Time coordinating discharge: 51 MINUTES SIGNED:   Hosie Poisson, MD  [facility] 12/08/2019, 2:37 PM  PT seen and examined, no changes in medications at this time.   Hosie Poisson , MD

## 2019-12-08 NOTE — Progress Notes (Signed)
°  °  Patient was resting comfortably and I did not disturb him.  He is okay for discharge from a vascular standpoint.  Elexa Kivi C. Donzetta Matters, MD [facility] Office: [phone] Pager: [phone]

## 2019-12-08 NOTE — Progress Notes (Signed)
IV and telemetry discontinued at this time. CCMD notfiied. Per [facility], they will be here to transport patient to [facility] in about 45 minutes.

## 2019-12-09 ENCOUNTER — Other Ambulatory Visit: Payer: Self-pay

## 2019-12-15 ENCOUNTER — Encounter: Payer: Self-pay | Admitting: Cardiovascular Disease

## 2019-12-15 ENCOUNTER — Ambulatory Visit (INDEPENDENT_AMBULATORY_CARE_PROVIDER_SITE_OTHER): Payer: Medicare Other | Admitting: Cardiovascular Disease

## 2019-12-15 ENCOUNTER — Other Ambulatory Visit: Payer: Self-pay

## 2019-12-15 VITALS — BP 123/60 | HR 67 | Temp 98.1°F | Ht 71.0 in | Wt 158.4 lb

## 2019-12-15 DIAGNOSIS — I6523 Occlusion and stenosis of bilateral carotid arteries: Secondary | ICD-10-CM

## 2019-12-15 DIAGNOSIS — Z720 Tobacco use: Secondary | ICD-10-CM

## 2019-12-15 DIAGNOSIS — I5189 Other ill-defined heart diseases: Secondary | ICD-10-CM

## 2019-12-15 DIAGNOSIS — I739 Peripheral vascular disease, unspecified: Secondary | ICD-10-CM

## 2019-12-15 DIAGNOSIS — I25118 Atherosclerotic heart disease of native coronary artery with other forms of angina pectoris: Secondary | ICD-10-CM

## 2019-12-15 DIAGNOSIS — E782 Mixed hyperlipidemia: Secondary | ICD-10-CM

## 2019-12-15 DIAGNOSIS — I4510 Unspecified right bundle-branch block: Secondary | ICD-10-CM

## 2019-12-15 DIAGNOSIS — I359 Nonrheumatic aortic valve disorder, unspecified: Secondary | ICD-10-CM

## 2019-12-15 DIAGNOSIS — I10 Essential (primary) hypertension: Secondary | ICD-10-CM | POA: Diagnosis not present

## 2019-12-15 DIAGNOSIS — E1159 Type 2 diabetes mellitus with other circulatory complications: Secondary | ICD-10-CM

## 2019-12-15 DIAGNOSIS — I519 Heart disease, unspecified: Secondary | ICD-10-CM

## 2019-12-15 DIAGNOSIS — Z794 Long term (current) use of insulin: Secondary | ICD-10-CM

## 2019-12-15 NOTE — Patient Instructions (Addendum)
Medication Instructions:  CONTINUE WITH CURRENT MEDICATIONS. NO CHANGES.  *If you need a refill on your cardiac medications before your next appointment, please call your pharmacy*   Testing/Procedures: Your physician has requested that you have an echocardiogram. Echocardiography is a painless test that uses sound waves to create images of your heart. It provides your doctor with information about the size and shape of your heart and how well your heart's chambers and valves are working. This procedure takes approximately one hour. There are no restrictions for this procedure.  [address]   Follow-Up: At [facility], you and your health needs are our priority.  As part of our continuing mission to provide you with exceptional heart care, we have created designated Provider Care Teams.  These Care Teams include your primary Cardiologist (physician) and Advanced Practice Providers (APPs -  Physician Assistants and Nurse Practitioners) who all work together to provide you with the care you need, when you need it.  We recommend signing up for the patient portal called "MyChart".  Sign up information is provided on this After Visit Summary.  MyChart is used to connect with patients for Virtual Visits (Telemedicine).  Patients are able to view lab/test results, encounter notes, upcoming appointments, etc.  Non-urgent messages can be sent to your provider as well.   To learn more about what you can do with MyChart, go to [email or web address].    Your next appointment:   6 month(s)  The format for your next appointment:   In Person  Provider:   Shelva Majestic, MD   Wean your nicotine patch from 23 to 16 to 9 then off entirely    Appear lives by the steps on his floor

## 2019-12-15 NOTE — Progress Notes (Signed)
Patient ID: Jason [name], male   DOB: 1951/06/16, 69 y.o.   MRN: [id number]    Primary M.D.: Dr. Arlyss Queen  HPI: Jason [name] is a 69 y.o. male who presents to the office today for a 39 month follow up cardiology evaluation. I last saw him in March 2018.  Jason [name] has a history of CAD and in 2001 underwent CABG revascularization surgery.  He has a history of hypertension, diabetes mellitus, COPD, and carotid disease.  In January 2015 he underwent cardiac catheterization which revealed a patent LIMA graft to his LAD, a patent sequential graft to marginal vessels, and an occluded sequential graft which previously had supplied the PDA and PLA vessels of the RCA and there was evidence for mild left-to-right collateralization.  In July 2015 he was admitted with recurrent anginal symptomatology.  Repeat catheterization revealed mild LV dysfunction with mid to basal inferior hypocontractility.  Ejection fraction was 50%.  He had significant native CAD with coronary calcification, 70% ostial and distal left main stenoses, 50% stenosis in the first diagonal branch was LAD, 80% mid LAD stenosis, and occluded high marginal/ramus intermediate vessel, 80% AV groove left circumflex stenosis and total proximal RCA occlusion.  He had a patent LIMA to the LAD, a patent sequential graft to a high marginal, a known 2 vessel in the vein graft to its previously supplied the PDA and PLA vessel remained occluded.  Increased medical therapy was recommended.  When I saw him in August 2016 he had begun to notice more episodes of chest pain.  He had been on Ranexa 500 mg twice a day, isosorbide 30 mg, and aspirin. I titrated his Ranexa to 1000 mg bid and addedd low dose metoprolol. He had a loud carotid bruit and I referred him for carotid duplex imaging. He was [name] to have an occluded L ICA and high grade R ICA stenosis.  I referred him to Dr. Gwenlyn [name] and due to his high risk for carotid endartectomy he underwent  successful right internal carotid artery PTA and stenting using a nitinol self expanding stent and distal carotid embolic protection in the setting of an occluded contralateral carotid artery and high risk coronary anatomy. He tolerated the procedure well.   Jason [name] underwent a 2-D echo Doppler study on 03/31/2015.  This showed an EF of 60-65%.  There was mild LVH with basal inferoposterior hypokinesis to akinesis and grade 1 diastolic dysfunction.  He denies chest pain, PND, neurologic sx.  His pulse has been faster. He is still smoking, although considerably less than previously.   I last saw him in March 2018 and prior to that evaluation he had experienced episodes of some mild chest pain with fast walking.  He was under some increased stress.  His mother died in June 08, 2023 and last week very close friend past away. Unfortunately he continued to smoke one pack of cigarettes per day.  In November, he states he was complaining of knee discomfort and stopped his atorvastatin, which previously had been 40 mg daily.  He was taking ranolazine 1000 mg twice a day, Toprol-XL 100 mg daily, isosorbide 90 mg in addition to his aspirin and Plavix.  He continues to take continue to proble.  He has continued to smoke.  M 50 g.  He has a history of type 2 diabetes mellitus and is on metformin.   Over the last 3 years since my evaluation, he h sequentially supplied a diagonal and mid LAD.  The graft supplying the  circumflex marginal vessel had 95% stenosis as continued to have problems.  He underwent a cardiac catheterization in May 2018 by Dr. Peter [name] which I reviewed today.  His LIMA  was patent which sequentially supplied a diagonal and LAD.  The sequential vein graft supplying the OM1 and OM 2 vessel had 95% stenosis proximal to its initial anastomosis which was successfully stented with a Resolute DES stent.  His native RCA and vein graft RCA were occluded.  Apparently, in 2020 he developed worsening peripheral  vascular disease and underwent a right femoropopliteal bypass graft and amputation of his first and second toes.  He has been followed by Dr. Donzetta [name].  He was recently hospitalized from June 4 through December 08, 2019 and due to critical lower extremity limb ischemia he underwent ultrasound-guided cannulation of his left common femoral, aortogram with bilateral lower extremity runoff, angioplasty of his right common femoral and stenting to the left external iliac on June 7 and left common femoral endarterectomy with left common femoral to peroneal artery bypass.   Presently he continues to smoke.  He was started on nicotine patch.  He continues to be on aspirin and Plavix.  He is diabetic on Janumet.  He is not having anginal symptoms on metoprolol 12.5 mg twice a day, ranolazine 1000 mg twice a day.  He has hypothyroidism on levothyroxine.  He presents for over 3-year follow-up evaluation with me.  Past Medical History:  Diagnosis Date  . Anxiety    off xanax  and paxil since 3/13  . Arthritis   . Bilateral carotid artery disease ([place])    s/p R ICA stent 03/28/2015 with distal protection. Known chronically occluded L ICA. Carotid stent complicated by hypotension and acute stroke in [place]  . Cancer ([place])    tumor basal cell rem from lft arm  . Cataract   . COPD (chronic obstructive pulmonary disease) ([place])   . Coronary artery disease    a. s/p CABG in 2011 with LIMA-LAD, SVG-RI/OM, and SVG-PDA b. occluded SVG-PDA by cath in 2015 c. 10/2016: NSTEMI with cath showing thrombus along the distal graft to insertion of SVG-OM2 with DES placed.   . CVA (cerebral infarction)    occured on 10/10 several days after R ICA carotid stenting  . Depression   . Diabetes mellitus   . GERD (gastroesophageal reflux disease)   . Heart attack ([place])   . High cholesterol   . Hypertension    type 2 NIDDM  . Myocardial infarct (H[facility])    x4 last 10 yrs  . Pneumonia    hx  . 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 Smoker   . Substance abuse [facility])     Past Surgical History:  Procedure Laterality Date  . ABDOMINAL AORTOGRAM W/LOWER EXTREMITY Bilateral 05/31/2019   Procedure: ABDOMINAL AORTOGRAM W/LOWER EXTREMITY;  Surgeon: Waynetta Sandy, MD;  Location: [place] [facility];  Service: Cardiovascular;  Laterality: Bilateral;  . AMPUTATION TOE Right 06/02/2019   Procedure: Amputation Right Great Toe and Second Toe;  Surgeon: Waynetta Sandy, MD;  Location: [place];  Service: Vascular;  Laterality: Right;  . BACK SURGERY  Jan 2014, Dec 2011   Dr Vertell Limber  . BYPASS GRAFT FEMORAL-PERONEAL Left 11/29/2019   Procedure: Left Bypass Graft Femoral-Peroneal using non reversed Greater Sapphenous vein;  Surgeon: Waynetta Sandy, MD;  Location: [place];  Service: Vascular;  Laterality: Left;  . CARDIAC CATHETERIZATION  4/12  Medical Rx  . CORONARY ANGIOGRAM  01/17/14   med rx  . CORONARY ANGIOGRAM  4/13   med Rx  . CORONARY ANGIOGRAM  1/15   Med Rx  . CORONARY ANGIOPLASTY  Jan 2004   RCA  . CORONARY ARTERY BYPASS GRAFT  07/24/2009   L-LAD, SVG-RI/OM, SVG-PDA  . CORONARY STENT INTERVENTION N/A 10/23/2016   Procedure: Coronary Stent Intervention;  Surgeon: Jason M Swaziland, MD;  Location: [facility] INVASIVE CV LAB;  Service: Cardiovascular;  Laterality: N/A;  . ENDARTERECTOMY FEMORAL Right 06/02/2019   Procedure: Endarterectomy External Iliac  Femoral Artery and Profunda;  Surgeon: Maeola Harman, MD;  Location: [facility] OR;  Service: Vascular;  Laterality: Right;  . EYE SURGERY     cat bil  . FEMORAL ARTERY - FEMORAL ARTERY BYPASS GRAFT Right 2004   femoral enarterectomy  . FEMORAL-POPLITEAL BYPASS GRAFT Right 06/02/2019   Procedure: RIGHT LEG BYPASS GRAFT FEMORAL-POPLITEAL ARTERY using Gore Propaten Vascular Graft Removable Ring;  Surgeon: Maeola Harman, MD;  Location: [facility] OR;  Service: Vascular;  Laterality: Right;  . LEFT HEART CATH AND  CORS/GRAFTS ANGIOGRAPHY N/A 10/23/2016   Procedure: Left Heart Cath and Cors/Grafts Angiography;  Surgeon: Jason M Swaziland, MD;  Location: [facility] INVASIVE CV LAB;  Service: Cardiovascular;  Laterality: N/A;  . LEFT HEART CATHETERIZATION WITH CORONARY ANGIOGRAM N/A 10/03/2011   Procedure: LEFT HEART CATHETERIZATION WITH CORONARY ANGIOGRAM;  Surgeon: Chrystie Nose, MD;  Location: [facility] CATH LAB;  Service: Cardiovascular;  Laterality: N/A;  . LEFT HEART CATHETERIZATION WITH CORONARY ANGIOGRAM N/A 07/08/2013   Procedure: LEFT HEART CATHETERIZATION WITH CORONARY ANGIOGRAM;  Surgeon: Micheline Chapman, MD;  Location: [facility] CATH LAB;  Service: Cardiovascular;  Laterality: N/A;  . LEFT HEART CATHETERIZATION WITH CORONARY/GRAFT ANGIOGRAM N/A 01/17/2014   Procedure: LEFT HEART CATHETERIZATION WITH Isabel Caprice;  Surgeon: Lennette Bihari, MD;  Location: [facility] CATH LAB;  Service: Cardiovascular;  Laterality: N/A;  . LOWER EXTREMITY ANGIOGRAPHY N/A 11/28/2019   Procedure: LOWER EXTREMITY ANGIOGRAPHY;  Surgeon: Maeola Harman, MD;  Location: [facility] INVASIVE CV LAB;  Service: Cardiovascular;  Laterality: N/A;  . PATCH ANGIOPLASTY Right 06/02/2019   Procedure: Patch Angioplasty using Hemashield Platinum Finesse Patch of the External Iliac Femoral Artery and Profunda;  Surgeon: Maeola Harman, MD;  Location: [facility] OR;  Service: Vascular;  Laterality: Right;  . PERIPHERAL VASCULAR BALLOON ANGIOPLASTY Right 11/28/2019   Procedure: PERIPHERAL VASCULAR BALLOON ANGIOPLASTY;  Surgeon: Maeola Harman, MD;  Location: [facility] INVASIVE CV LAB;  Service: Cardiovascular;  Laterality: Right;  Common femoral  . PERIPHERAL VASCULAR CATHETERIZATION N/A 03/28/2015   Procedure: Carotid PTA/Stent Intervention;  Surgeon: Runell Gess, MD;  Location: [facility] INVASIVE CV LAB;  Service: Cardiovascular;  Laterality: N/A;  . PERIPHERAL VASCULAR INTERVENTION Left 11/28/2019   Procedure: PERIPHERAL VASCULAR INTERVENTION;  Surgeon:  Maeola Harman, MD;  Location: [facility] INVASIVE CV LAB;  Service: Cardiovascular;  Laterality: Left;  external iliac  . US EXTREMITY*L*     lft arm tumor removed     Allergies  Allergen Reactions  . Coreg [Carvedilol] Nausea And Vomiting and Other (See Comments)    Per patient made him dizzy and light sensitive  . Sulfa Antibiotics Nausea And Vomiting and Other (See Comments)    Also headaches     Current Outpatient Medications  Medication Sig Dispense Refill  . acetaminophen (TYLENOL) 325 MG tablet Take 650 mg by mouth every 6 (six) hours as needed for mild pain or headache.     Marland Kitchen  aspirin 81 MG tablet Take 1 tablet (81 mg total) by mouth daily.    . clopidogrel (PLAVIX) 75 MG tablet Take 1 tablet (75 mg total) by mouth daily. 30 tablet 0  . ferrous sulfate 325 (65 FE) MG tablet Take 1 tablet (325 mg total) by mouth daily with breakfast. 30 tablet 3  . gabapentin (NEURONTIN) 300 MG capsule Take 1 capsule (300 mg total) by mouth 2 (two) times daily. 60 capsule 1  . levothyroxine (SYNTHROID) 50 MCG tablet Take 1 tablet (50 mcg total) by mouth daily. 30 tablet 0  . loratadine (CLARITIN) 10 MG tablet Take 10 mg by mouth daily.    . metoprolol tartrate (LOPRESSOR) 25 MG tablet Take 0.5 tablets (12.5 mg total) by mouth 2 (two) times daily. 60 tablet 0  . nicotine (NICODERM CQ - DOSED IN MG/24 HOURS) 21 mg/24hr patch Place 1 patch (21 mg total) onto the skin daily. 28 patch 0  . nitroGLYCERIN (NITROSTAT) 0.4 MG SL tablet PLACE 1 TABLET (0.4 MG TOTAL) UNDER THE TONGUE EVERY 5 (FIVE) MINUTES times 3 then call MD (Patient taking differently: Place 0.4 mg under the tongue every 5 (five) minutes as needed for chest pain. ) 25 tablet 2  . pantoprazole (PROTONIX) 40 MG tablet Take 1 tablet (40 mg total) by mouth daily. 30 tablet 0  . ranolazine (RANEXA) 1000 MG SR tablet TAKE 1 TABLET BY MOUTH 2 TIMES DAILY. NEED OV. (Patient taking differently: Take 500 mg by mouth 2 (two) times daily. ) 60  tablet 4  . rosuvastatin (CRESTOR) 20 MG tablet Take 1 tablet (20 mg total) by mouth daily. 90 tablet 3  . sertraline (ZOLOFT) 100 MG tablet Take 2 and 1/2 tablets by mouth daily (Patient taking differently: Take 250 mg by mouth daily. ) 75 tablet 0  . vitamin B-12 1000 MCG tablet Take 1 tablet (1,000 mcg total) by mouth daily.    . sitaGLIPtin-metformin (JANUMET) 50-500 MG tablet Take 1 tablet by mouth 2 (two) times daily with a meal. PCP NEED TO REFILL NEXT. (Patient taking differently: Take 1 tablet by mouth 2 (two) times daily with a meal. ) 60 tablet 0   No current facility-administered medications for this visit.    Social History   Socioeconomic History  . Marital status: Single    Spouse name: Not on file  . Number of children: 1  . Years of education: 42  . Highest education level: Not on file  Occupational History  . Occupation: welder-fabricator  Tobacco Use  . Smoking status: Current Every Day Smoker    Packs/day: 1.00    Years: 48.00    Pack years: 48.00    Types: Cigarettes  . Smokeless tobacco: Never Used  Vaping Use  . Vaping Use: Never used  Substance and Sexual Activity  . Alcohol use: Yes    Alcohol/week: 0.0 standard drinks    Comment: socially  . Drug use: Not Currently  . Sexual activity: Not on file  Other Topics Concern  . Not on file  Social History Narrative   Lives alone   Caffeine use: Drinks pepsi/coffee (32 oz diet pepsi per day)    Exercise walking 4 times per week for 1 mile   Current PPD smoker.    Social Determinants of Health   Financial Resource Strain:   . Difficulty of Paying Living Expenses:   Food Insecurity:   . Worried About Charity fundraiser in the Last Year:   . [employer]  of Food in the Last Year:   Transportation Needs:   . Film/video editor (Medical):   Marland Kitchen Lack of Transportation (Non-Medical):   Physical Activity:   . Days of Exercise per Week:   . Minutes of Exercise per Session:   Stress:   . Feeling of Stress  :   Social Connections:   . Frequency of Communication with Friends and Family:   . Frequency of Social Gatherings with Friends and Family:   . Attends Religious Services:   . Active Member of Clubs or Organizations:   . Attends Archivist Meetings:   Marland Kitchen Marital Status:   Intimate Partner Violence:   . Fear of Current or Ex-Partner:   . Emotionally Abused:   Marland Kitchen Physically Abused:   . Sexually Abused:     Family History  Problem Relation Age of Onset  . Arthritis Mother   . Heart disease Father     ROS General: Negative; No fevers, chills, or night sweats HEENT: Negative; No changes in vision or hearing, sinus congestion, difficulty swallowing Pulmonary: Negative; No cough, wheezing, shortness of breath, hemoptysis Cardiovascular: See HPI:  GI: Negative; No nausea, vomiting, diarrhea, or abdominal pain GU: Negative; No dysuria, hematuria, or difficulty voiding Musculoskeletal: Occasional muscle cramps. Hematologic: Negative; no easy bruising, bleeding Endocrine: Positive for hypothyroidism and diabetes mellitus Neuro: Negative; no changes in balance, headaches Skin: Negative; No rashes or skin lesions Psychiatric: On Zoloft Sleep: Negative; No snoring,  daytime sleepiness, hypersomnolence, bruxism, restless legs, hypnogognic hallucinations. Other comprehensive 14 point system review is negative   Physical Exam BP 123/60   Pulse 67   Temp 98.1 F (36.7 C)   Ht '5\' 11"'$  (1.803 m)   Wt 158 lb 6.4 oz (71.8 kg)   SpO2 92%   BMI 22.09 kg/m    Repeat blood pressure by me 120/72  Wt Readings from Last 3 Encounters:  12/16/19 158 lb (71.7 kg)  12/15/19 158 lb 6.4 oz (71.8 kg)  11/29/19 160 lb 4.4 oz (72.7 kg)   General: Alert, oriented, no distress.  Skin: normal turgor, no rashes, warm and dry HEENT: Normocephalic, atraumatic. Pupils equal round and reactive to light; sclera anicteric; extraocular muscles intact;  Nose without nasal septal  hypertrophy Mouth/Parynx benign; Mallinpatti scale 3 Neck: No JVD, no carotid bruits; normal carotid upstroke Lungs: Slightly decreased breath sounds without wheezing  Chest wall: without tenderness to palpitation Heart: PMI not displaced, RRR, s1 s2 normal, 1/6 systolic murmur, no diastolic murmur, no rubs, gallops, thrills, or heaves Abdomen: soft, nontender; no hepatosplenomehaly, BS+; abdominal aorta nontender and not dilated by palpation. Back: no CVA tenderness Pulses 2+ Musculoskeletal: full range of motion, normal strength, no joint deformities Extremities: No edema.  Status post right first and second toe amputation Neurologic: grossly nonfocal; Cranial nerves grossly wnl Psychologic: Normal mood and affect   ECG (independently read by me): NSR at 67l RBBB with repolarization changes, Inferior Q waves c/w old MI, QTc 509 msec  March 2018 ECG (independently read by me): Normal sinus rhythm at 76 bpm.  Right bundle-branch block with repolarization changes.  Possible left posterior hemiblock.  Small inferior Q waves  July 2017 ECG (independently read by me): Sinus rhythm at 80 bpm with an occasional PVC.  Right bundle branch block.  Q wave in lead 3.  November 2016 ECG (independently read by me): NSR at 95; RBBB with repolariztion changes  August 2016 ECG (independently read by me): Normal sinus rhythm at 78 bpm.  Right bundle branch block with repolarization changes.  LABS:  BMP Latest Ref Rng & Units 12/08/2019 12/07/2019 12/06/2019  Glucose 70 - 99 mg/dL 157(H) 147(H) 151(H)  BUN 8 - 23 mg/dL '13 11 10  '$ Creatinine 0.61 - 1.24 mg/dL 1.29(H) 1.48(H) 1.26(H)  BUN/Creat Ratio 10 - 24 - - -  Sodium 135 - 145 mmol/L 128(L) 129(L) 133(L)  Potassium 3.5 - 5.1 mmol/L 4.8 5.0 4.4  Chloride 98 - 111 mmol/L 97(L) 99 100  CO2 22 - 32 mmol/L '23 22 22  '$ Calcium 8.9 - 10.3 mg/dL 9.0 8.9 9.1     Hepatic Function Latest Ref Rng & Units 11/29/2019 11/28/2019 11/28/2019  Total Protein 6.5 - 8.1  g/dL 6.4(L) 6.6 7.3  Albumin 3.5 - 5.0 g/dL 3.2(L) 3.3(L) 3.7  AST 15 - 41 U/L '16 17 16  '$ ALT 0 - 44 U/L '17 18 17  '$ Alk Phosphatase 38 - 126 U/L 74 80 83  Total Bilirubin 0.3 - 1.2 mg/dL 0.6 0.3 0.7    CBC Latest Ref Rng & Units 12/07/2019 12/04/2019 12/03/2019  WBC 4.0 - 10.5 K/uL 9.7 8.3 10.1  Hemoglobin 13.0 - 17.0 g/dL 9.4(L) 8.8(L) 8.9(L)  Hematocrit 39 - 52 % 28.6(L) 26.5(L) 27.2(L)  Platelets 150 - 400 K/uL 283 223 239   Lab Results  Component Value Date   MCV 97.6 12/07/2019   MCV 95.7 12/04/2019   MCV 97.1 12/03/2019    Lab Results  Component Value Date   TSH 6.423 (H) 06/11/2019    BNP    Component Value Date/Time   BNP 272.8 (H) 10/22/2016 1509    ProBNP    Component Value Date/Time   PROBNP 77.9 07/07/2013 2033     Lipid Panel     Component Value Date/Time   CHOL 103 12/01/2019 0254   CHOL 324 (H) 05/06/2018 1640   TRIG 156 (H) 12/01/2019 0254   HDL 29 (L) 12/01/2019 0254   HDL 41 05/06/2018 1640   CHOLHDL 3.6 12/01/2019 0254   VLDL 31 12/01/2019 0254   LDLCALC 43 12/01/2019 0254   LDLCALC 205 (H) 05/06/2018 1640     RADIOLOGY: PERIPHERAL VASCULAR CATHETERIZATION  Result Date: 11/28/2019 Patient name: Jason [name] MRN: [id number] DOB: [birth year] Sex: male 11/28/2019 Pre-operative Diagnosis: Critical bilateral lower extremity ischemia with wounds Post-operative diagnosis:  Same Surgeon:  Erlene Quan C. Jason Matters, MD Procedure Performed: 1.  Ultrasound-guided cannulation left common femoral artery 2.  Aortogram with bilateral lower extremity runoff 3.  Chocolate balloon angioplasty of right common femoral artery with 6 x 40 mm balloon 4.  Stent of the left left external iliac artery with 6 x 40 mm Innova Indications: 69 year old male with history of right first and second toe amputation now has recurrent wounds there.  Has a history of a right femoral to popliteal artery bypass after previous revascularization of his right common femoral artery after cardiac  catheterization.  On the left side he has had monophasic waveforms decreased ABIs but has not had pain.  He now has ulceration of the left foot including the left lateral foot left great toe and heel.  These are minor he is now indicated for angiography with possible invention likely bypass surgery. Findings: We difficulty accessing the left common femoral artery given its diminutive size.  There was a dissection we ultimately stented left external iliac artery with 6 x 40 Innova.  The aorta iliac segments do not have any flow-limiting stenosis but are heavily diseased.  On the right side the  common femoral artery had 80% stenosis which was reduced to 0% stenosis after chocolate balloon angioplasty the bypass is patent and he has runoff via the anterior tibial and peroneal arteries.  On the left side after stenting we have good inflow to the left common femoral artery.  The SFA is quite diminutive in frankly occludes appears in the mid thigh reconstitutes and above-knee popliteal artery which occludes at the level of the anterior tibial artery and he reconstitutes the peroneal artery in the mid calf. Patient will be scheduled for left common femoral to peroneal artery bypass.  Procedure:  The patient was identified in the holding area and taken to room 8.  The patient was then placed supine on the table and prepped and draped in the usual sterile fashion.  A time out was called.  Ultrasound was used to evaluate the left common femoral artery.  I could actually not pass micropuncture wire very far.  We did save an image department record.  I placed a micropuncture sheath used a Bentson wire was able to get this in part of the placed a 5 French sheath halfway.  I then performed retrograde angiography this demonstrated dissection.  I used a bare catheter and Glidewire advantage was able to steer into a true luminal plane and put an Omni catheter level of T12 performed angiography.  There was demonstratable dissection  of the left external iliac artery as well as tight stenosis of the right common femoral artery.  I then crossed the bifurcation with Omni catheter and Glidewire advantage.  Patient was fully heparinized I placed a long 6 French sheath into the right external iliac artery.  I then crossed the lesion with a V 18 wire into the right femoropopliteal bypass performed angiography of the right lower extremity where I could visualize the tibial arteries to be patent as well as the bypass throughout his course.  I then performed topical balloon angioplasty with a 6 mm balloon at nominal pressure for 3 minutes.  Completion demonstrated no residual stenosis or dissection.  Satisfied I retracted my sheath to the common femoral artery on the left just above the cannulation site.  Perform retrograde angiography demonstrated our dissection.  This was then primarily stented over the V 18 wire with a 6 x 40 mm Innova.  This was dilated with 6 x 40 mm chocolate balloon.  Completion demonstrated no residual stenosis or dissection.  We then perform left lower extremity angiography we demonstrated peroneal artery is the dominant runoff in the mid calf.  Satisfied with this we removed our wire.  Sheath will be pulled in postoperative holding.  Patient will be scheduled for left common femoral to peroneal artery bypass tomorrow in the operating room. Contrast: 165cc Brandon C. Jason Matters, MD [facility] [place] [facility] Office: [phone] Pager: [phone]  DG Chest Port 1 View  Result Date: 12/02/2019 CLINICAL DATA:  Leukocytosis EXAM: PORTABLE CHEST 1 VIEW COMPARISON:  Portable exam 0753 hours compared to 05/27/2019 FINDINGS: Normal heart size post CABG and coronary stenting. Mediastinal contours and pulmonary vascularity normal. Atherosclerotic calcification aorta. Minimal LEFT basilar atelectasis. Lungs otherwise clear. No pulmonary infiltrate, pleural effusion, or pneumothorax. Probable RIGHT nipple shadow unchanged  since 07/25/2009 Bones demineralized. IMPRESSION: Post CABG and coronary stenting. LEFT basilar atelectasis without acute infiltrate. Electronically Signed   By: Lavonia Dana M.D.   On: 12/02/2019 08:30   DG Foot Complete Right  Result Date: 11/25/2019 CLINICAL DATA:  Right foot ulcer.  Evaluate for osteomyelitis  EXAM: RIGHT FOOT COMPLETE - 3+ VIEW COMPARISON:  06/11/2019 FINDINGS: Prior 1st and 2nd toe transmetatarsal amputation. Stable appearance. Subluxation of the 3rd MTP joint medially. There is a fracture within the proximal phalanx of the 4th toe. This is new since prior study. No definite bone destruction seen to suggest osteomyelitis. IMPRESSION: First and 2nd toe amputation. Subluxation of the 3rd MTP joint. Fracture at the 4th toe proximal phalanx. Electronically Signed   By: Rolm Baptise M.D.   On: 11/25/2019 18:50   VAS [place] ABI WITH/WO TBI  Result Date: 12/05/2019 LOWER EXTREMITY DOPPLER STUDY Indications: Post-Op.  Vascular Interventions: 11-29-19 S/P Left Bypass Graft Femoral-Peroneal                          06-02-19 RT Leg Bypass Graft Fem-Pop and Rt great toe                         amputation. Comparison Study: Last study done 11-27-19 Performing Technologist: Darlin Coco Supporting Technologist: Oda Cogan RDMS, RVT  Examination Guidelines: A complete evaluation includes at minimum, Doppler waveform signals and systolic blood pressure reading at the level of bilateral brachial, anterior tibial, and posterior tibial arteries, when vessel segments are accessible. Bilateral testing is considered an integral part of a complete examination. Photoelectric Plethysmograph (PPG) waveforms and toe systolic pressure readings are included as required and additional duplex testing as needed. Limited examinations for reoccurring indications may be performed as noted.  ABI Findings: +---------+------------------+-----+----------+----------------+ Right    Rt Pressure (mmHg)IndexWaveform   Comment          +---------+------------------+-----+----------+----------------+ Brachial 133                    triphasic                  +---------+------------------+-----+----------+----------------+ PTA      118               0.89 monophasic                 +---------+------------------+-----+----------+----------------+ DP       134               1.01 monophasic                 +---------+------------------+-----+----------+----------------+ Great Toe                                 Rt toe amputated +---------+------------------+-----+----------+----------------+ +---------+------------------+-----+----------+-------+ Left     Lt Pressure (mmHg)IndexWaveform  Comment +---------+------------------+-----+----------+-------+ Brachial 132                    triphasic         +---------+------------------+-----+----------+-------+ PERO                            monophasic        +---------+------------------+-----+----------+-------+ DP       89                0.67 monophasic        +---------+------------------+-----+----------+-------+ Great Toe73                0.55                   +---------+------------------+-----+----------+-------+ +-------+-----------+-----------+------------+------------+ ABI/TBIToday's ABIToday's TBIPrevious ABIPrevious  TBI +-------+-----------+-----------+------------+------------+ Right  1.0                   0.64                     +-------+-----------+-----------+------------+------------+ Left   0.67       0.55       0.29                     +-------+-----------+-----------+------------+------------+ Right ABIs appear increased compared to prior study on 11/27/2019. Left ABIs appear increased compared to prior study on 11/27/2019.  Summary: Right: Resting right ankle-brachial index is within normal range. No evidence of significant right lower extremity arterial disease. ABIs are unreliable. Left:  Resting left ankle-brachial index indicates moderate left lower extremity arterial disease. The left toe-brachial index is abnormal.  *See table(s) above for measurements and observations.  Electronically signed by Curt Jews MD on 12/05/2019 at 5:36:23 PM.    Final    VAS [place] ABI WITH/WO TBI  Result Date: 11/27/2019 LOWER EXTREMITY DOPPLER STUDY Indications: Rest pain, and non healing left great toe wound. High Risk Factors: Hypertension, hyperlipidemia, Diabetes, current smoker.  Vascular Interventions: Right femoropopliteal bypass graft and right great toe                         amputation -06/02/19. Performing Technologist: Oda Cogan Chief Tech  Examination Guidelines: A complete evaluation includes at minimum, Doppler waveform signals and systolic blood pressure reading at the level of bilateral brachial, anterior tibial, and posterior tibial arteries, when vessel segments are accessible. Bilateral testing is considered an integral part of a complete examination. Photoelectric Plethysmograph (PPG) waveforms and toe systolic pressure readings are included as required and additional duplex testing as needed. Limited examinations for reoccurring indications may be performed as noted.  ABI Findings: +---------+------------------+-----+----------+--------+ Right    Rt Pressure (mmHg)IndexWaveform  Comment  +---------+------------------+-----+----------+--------+ Brachial 137                    triphasic          +---------+------------------+-----+----------+--------+ ATA      81                0.58 monophasic         +---------+------------------+-----+----------+--------+ PTA      89                0.64                    +---------+------------------+-----+----------+--------+ PERO                            monophasic         +---------+------------------+-----+----------+--------+ Great Toe                       Absent              +---------+------------------+-----+----------+--------+ +--------+------------------+-----+----------+-------+ Left    Lt Pressure (mmHg)IndexWaveform  Comment +--------+------------------+-----+----------+-------+ [id number]                    triphasic         +--------+------------------+-----+----------+-------+ ATA     0                 0.00 absent            +--------+------------------+-----+----------+-------+ PTA  0                 0.00 absent            +--------+------------------+-----+----------+-------+ PERO    41                0.29 monophasic        +--------+------------------+-----+----------+-------+ +-------+-----------+-----------+------------+------------+ ABI/TBIToday's ABIToday's TBIPrevious ABIPrevious TBI +-------+-----------+-----------+------------+------------+ Right  0.64       N/A        0.85        N/A          +-------+-----------+-----------+------------+------------+ Left   0.29       N/A        0.24        N/A          +-------+-----------+-----------+------------+------------+ Right ABIs appear decreased compared to prior study on 07/29/19. Left ABIs appear essentially unchanged compared to prior study on 07/29/19.  Summary: Right: Resting right ankle-brachial index indicates moderate right lower extremity arterial disease. Left: Resting left ankle-brachial index indicates critical left limb ischemia.  *See table(s) above for measurements and observations.  Electronically signed by Ruta Hinds MD on 11/27/2019 at 7:12:01 PM.    Final    VAS [place] LOWER EXTREMITY ARTERIAL DUPLEX  Result Date: 11/27/2019 LOWER EXTREMITY ARTERIAL DUPLEX STUDY Indications: Rest pain, and ulceration. High Risk Factors: Hypertension, hyperlipidemia, Diabetes, current smoker,                    coronary artery disease.  Vascular Interventions: Right femoropopliteal bypass graft and right great toe                         amputation 06/02/19. Current ABI:             Right 0.26, left 0.29 Performing Technologist: Oda Cogan Chief Tech  Examination Guidelines: A complete evaluation includes B-mode imaging, spectral Doppler, color Doppler, and power Doppler as needed of all accessible portions of each vessel. Bilateral testing is considered an integral part of a complete examination. Limited examinations for reoccurring indications may be performed as noted.  +-----------+--------+-----+--------+----------+--------+ RIGHT      PSV cm/sRatioStenosisWaveform  Comments +-----------+--------+-----+--------+----------+--------+ ATA Distal 62                   monophasic         +-----------+--------+-----+--------+----------+--------+ PTA Prox                occluded                   +-----------+--------+-----+--------+----------+--------+ PTA Mid                 occluded                   +-----------+--------+-----+--------+----------+--------+ PTA Distal 12                                      +-----------+--------+-----+--------+----------+--------+ PERO Distal62                   monophasic         +-----------+--------+-----+--------+----------+--------+  Right Graft #1: +------------------+--------+--------+----------+--------+                   PSV cm/sStenosisWaveform  Comments +------------------+--------+--------+----------+--------+ Inflow  86              monophasic         +------------------+--------+--------+----------+--------+ Prox Anastomosis  117             monophasic         +------------------+--------+--------+----------+--------+ Proximal Graft    52              monophasic         +------------------+--------+--------+----------+--------+ Mid Graft         45              monophasic         +------------------+--------+--------+----------+--------+ Distal Graft      55              monophasic          +------------------+--------+--------+----------+--------+ Distal Anastomosis56              monophasic         +------------------+--------+--------+----------+--------+ Outflow           141             monophasic         +------------------+--------+--------+----------+--------+ Patent graft with monophasic flow throughout.  +-----------+--------+-----+--------+----------+-----------------+ LEFT       PSV cm/sRatioStenosisWaveform  Comments          +-----------+--------+-----+--------+----------+-----------------+ CFA Distal 118                  monophasic                  +-----------+--------+-----+--------+----------+-----------------+ DFA        153                  monophasic                  +-----------+--------+-----+--------+----------+-----------------+ SFA Prox   65                   monophasic                  +-----------+--------+-----+--------+----------+-----------------+ SFA Mid    62                   monophasic                  +-----------+--------+-----+--------+----------+-----------------+ SFA Distal 51                   monophasic                  +-----------+--------+-----+--------+----------+-----------------+ POP Prox                        monophasic                  +-----------+--------+-----+--------+----------+-----------------+ POP Distal 57                   monophasic                  +-----------+--------+-----+--------+----------+-----------------+ ATA Prox                occluded                            +-----------+--------+-----+--------+----------+-----------------+ ATA Mid                 occluded                            +-----------+--------+-----+--------+----------+-----------------+  ATA Distal              occluded                            +-----------+--------+-----+--------+----------+-----------------+ PTA Prox   26                   monophasic                   +-----------+--------+-----+--------+----------+-----------------+ PTA Mid    0            occluded                            +-----------+--------+-----+--------+----------+-----------------+ PTA Distal 0            occluded                            +-----------+--------+-----+--------+----------+-----------------+ PERO Mid   22                   monophasicseverely dampened +-----------+--------+-----+--------+----------+-----------------+ PERO Distal20                   monophasicseverly dampened  +-----------+--------+-----+--------+----------+-----------------+  Summary: Right: Right 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 Left: Left common femoral artery, superficial femoral artery, profunda femoral artery and popliteal artery are patent with monophasic flow without significant stenosis. The posterior tibial and anterior tibial arteries appears occluded. Peroneal artery appears patent with severly dampened flow.  See table(s) above for measurements and observations. Electronically signed by Ruta Hinds MD on 11/27/2019 at 7:11:53 PM.    Final      IMPRESSION:  1. Coronary artery disease of native artery of native heart with stable angina pectoris ([place])   2. Essential hypertension   3. Mixed hyperlipidemia   4. PAD (peripheral artery disease) ([place])   5. Bilateral carotid artery stenosis   6. Type 2 diabetes mellitus with other circulatory complication, with long-term current use of insulin ([place])   7. Tobacco abuse   8. RBBB   9. Grade I diastolic dysfunction   10. Aortic valve disease     ASSESSMENT AND PLAN: Mr. Jason [name] is a 69 year old male who has a history of hypertension, hyperlipidemia, CAD, PVD, tobacco abuse diabetes mellitus and anxiety who underwent CABG revascularization surgery in 2001.  He has documented occlusion of the vein graft which previously had supplied the PDA and PLA branches of his RCA.  I  reviewed his most recent catheterization which occurred after his last office visit with me in 2018.  He had severe native CAD with a patent sequential LIMA graft supplying his diagonal and LAD.  The sequential vein graft which supplied a high marginal/ramus-like vessel and OM 2 vessel now had new 95% stenosis which was successfully stented.  His native RCA and his SVG to RCA were occluded.  Presently he is not not having any significant anginal symptomatology but continues to be on aspirin/Plavix in addition to metoprolol tartrate 12.5 mg twice a day, ranolazine 1000 mg twice a day.  He has significant peripheral vascular disease and underwent very complex peripheral intervention to a high-grade ostial right internal carotid artery stenosis in the setting of having an occluded left internal carotid artery.  He subsequently has developed lower extremity claudication and in December 2020 underwent right femoropopliteal bypass  surgery with amputation of his first and second toes.  More recently he was rehospitalized in June 2021 and required additional intervention with angioplasty of the right common femoral and stenting to the left external iliac June 7 and left common femoral endarterectomy with left common femoral to peroneal artery bypass for critical lower limb ischemia.  He has been smoking since age 89.  He was recently started on nicotine patch.  Stressed the importance of not smoking while taking the patch but ultimately he needs to gradually wean his patch regimen from 21 down to 14 and subsequently down to 7 with hopeful complete discontinuance of tobacco.  His ECG today shows sinus rhythm with right bundle branch block.  His QTc interval is increased contributed by his conduction abnormality and when corrected is stable on ranolazine.  He is on levothyroxine for hypothyroidism.  He continues to be on rosuvastatin for hyperlipidemia.  Target LDL is less than 70.  Most recent lipid panel from December 01, 2019  showed his LDL cholesterol markedly improved from 205 one year ago down to 43 presently.  Chronically occluded left internal carotid and stable 50 to 70% stenosis of his ICA on his last duplex in August 2020.  Plan is for repeat Doppler later this year.  He continues to be on Janumet that for his diabetes mellitus.  He has not had recent reassessment of LV function.  I am scheduling him for 2D echo Doppler study.  Previously he was noted to have mild diastolic dysfunction and severe focal calcification involving his left coronary noncoronary cusp of trileaflet aortic valve.  I will contact him regarding results and see him in 6 months for reevaluation or sooner as needed.  Troy Sine, MD, [facility]  12/17/2019 1:00 PM

## 2019-12-16 ENCOUNTER — Ambulatory Visit (INDEPENDENT_AMBULATORY_CARE_PROVIDER_SITE_OTHER): Payer: Medicare Other | Admitting: Vascular Surgery

## 2019-12-16 ENCOUNTER — Encounter: Payer: Self-pay | Admitting: Vascular Surgery

## 2019-12-16 ENCOUNTER — Other Ambulatory Visit: Payer: Self-pay

## 2019-12-16 VITALS — BP 134/69 | HR 55 | Resp 20 | Ht 71.0 in | Wt 158.0 lb

## 2019-12-16 DIAGNOSIS — I739 Peripheral vascular disease, unspecified: Secondary | ICD-10-CM

## 2019-12-16 NOTE — Progress Notes (Signed)
    Subjective:     Patient ID: Jason [name], male   DOB: 24-Aug-1950, 69 y.o.   MRN: [id number]  HPI 69 year old male with a history of first and second toe amputations and a right femoropopliteal bypass with ulceration of the left great toe.  Most recently underwent topical balloon angioplasty of the right common femoral artery stent of the left external iliac artery followed by left common femoral to peroneal artery bypass with vein the following day.  Amputations on the right have now come closer to healing or previously there was an eschar.  Toe ulceration nearly healed does have swelling of the left lower extremity.   Review of Systems Ulceration left great toe improving has swelling of the left lower extremity Right toe amputation sites healing better    Objective:   Physical Exam Vitals:   12/16/19 1512  BP: 134/69  Pulse: (!) 55  Resp: 20  SpO2: 96%   Awake alert oriented Right common femoral pulses easily palpable Left common femoral pulse palpable incisions clean dry intact there is some swelling Strong bilateral anterior tibial and posterior tibial signals Right first and second toe amputation site healing well there is an ulcer on the dorsum of his foot overlying the third toe Left great toe ulcer appears to be healing well    Assessment/plan   69 year old male status post above-noted procedures.  Currently wounds are healing well.  We will get him to follow-up in 3 months with bilateral lower extremity arterial duplexes to evaluate his bypasses and ABIs.      Aunna Snooks C. Donzetta Matters, MD [facility] Office: [phone] Pager: [phone]

## 2019-12-17 ENCOUNTER — Encounter: Payer: Self-pay | Admitting: Cardiovascular Disease

## 2019-12-19 ENCOUNTER — Other Ambulatory Visit: Payer: Self-pay | Admitting: *Deleted

## 2019-12-19 DIAGNOSIS — I739 Peripheral vascular disease, unspecified: Secondary | ICD-10-CM

## 2019-12-21 NOTE — Telephone Encounter (Signed)
This encounter was created in error - please disregard.

## 2019-12-27 ENCOUNTER — Inpatient Hospital Stay: Payer: Medicare Other | Admitting: Emergency Medicine

## 2019-12-28 ENCOUNTER — Encounter: Payer: Self-pay | Admitting: Emergency Medicine

## 2020-01-06 ENCOUNTER — Other Ambulatory Visit (HOSPITAL_COMMUNITY): Payer: Medicare Other

## 2020-01-12 LAB — HM DIABETES EYE EXAM

## 2020-01-17 ENCOUNTER — Telehealth: Payer: Self-pay | Admitting: Cardiovascular Disease

## 2020-01-17 NOTE — Telephone Encounter (Signed)
Cord is returning Lisa's call to schedule his Echo. He states he is still wanting to put off scheduling due to having a blister on the bottom of his right foot that he cannot stand on due to the pain. I advised Renso I would let Lattie Haw know and he stated he will callback to schedule once his foot is better.

## 2020-01-20 ENCOUNTER — Encounter (HOSPITAL_COMMUNITY): Payer: Self-pay | Admitting: Cardiovascular Disease

## 2020-01-24 ENCOUNTER — Other Ambulatory Visit: Payer: Self-pay | Admitting: Cardiovascular Disease

## 2020-01-24 ENCOUNTER — Encounter: Payer: Self-pay | Admitting: *Deleted

## 2020-01-27 ENCOUNTER — Telehealth: Payer: Self-pay

## 2020-01-27 ENCOUNTER — Telehealth: Payer: Self-pay | Admitting: *Deleted

## 2020-01-27 ENCOUNTER — Telehealth: Payer: Self-pay | Admitting: Cardiovascular Disease

## 2020-01-27 ENCOUNTER — Ambulatory Visit (HOSPITAL_COMMUNITY): Payer: Medicare Other

## 2020-01-27 NOTE — Telephone Encounter (Signed)
-----   Message from Marguerita Merles, RN sent at 01/27/2020  2:45 PM EDT ----- Regarding: FW: gapapentin refill Can you call this pt and let him know this is what PA said. ----- Message ----- From: Barbie Banner, PA-C Sent: 01/27/2020   2:33 PM EDT To: Marguerita Merles, RN Subject: RE: gapapentin refill                          I can't determine the reason he was prescribed the med. I would recommend he discuss w ith pcp. He was instructed to follow up with him in 1-2 weeks after discharge in June ----- Message ----- From: Marguerita Merles, RN Sent: 01/27/2020   2:08 PM EDT To: Barbie Banner, PA-C Subject: gapapentin refill                              This pt also is asking for refill of Gabapentin. He was prescribed it in ED 11/25/19 and is out. Please let me know if it is ok to refill this for him. Thanks, Izora Gala

## 2020-01-27 NOTE — Telephone Encounter (Signed)
Left message to call back  

## 2020-01-27 NOTE — Telephone Encounter (Signed)
Pt called with c/o open sore on bottom of R foot. This has been ongoing since January. Pt states it is like a callous that won't heal. Per PA, he should see podiatry. I have let him know and pt is aware and verbalized understanding.

## 2020-01-27 NOTE — Telephone Encounter (Signed)
Spoke with patient let him know to check with PCP regarding refill . Patient verbalized understanding of all instructions.

## 2020-01-27 NOTE — Telephone Encounter (Signed)
   Pt c/o medication issue:  1. Name of Medication:  isosorbid 120 mg levothyroxine (SYNTHROID) 50 MCG tablet janumet 50/500 mg  2. How are you currently taking this medication (dosage and times per day)?   3. Are you having a reaction (difficulty breathing--STAT)?   4. What is your medication issue? Pt asked if DR. Kelly send a prescribe this medication for him.

## 2020-01-30 ENCOUNTER — Telehealth (HOSPITAL_COMMUNITY): Payer: Self-pay | Admitting: Cardiovascular Disease

## 2020-01-30 ENCOUNTER — Other Ambulatory Visit: Payer: Self-pay

## 2020-01-30 MED ORDER — JANUMET 50-500 MG PO TABS: ORAL_TABLET | ORAL | 3 refills | Status: DC

## 2020-01-30 NOTE — Telephone Encounter (Signed)
Just an FYI. We have made several attempts to contact this patient including sending a letter to schedule or reschedule their echocardiogram. We will be removing the patient from the echo [place].   01/20/20 MAILED LETTER  01/20/20 [facility] to reschedule @ 10:01/[facility]W  01/17/20 Per staff message pt does not wish to schedule at this time due to blister on foot/LBW  [facility] to reschedule NS @ 1:57/LBW  Pt No Showed 01/06/2020       Thank you

## 2020-01-31 ENCOUNTER — Other Ambulatory Visit: Payer: Self-pay | Admitting: Cardiovascular Disease

## 2020-01-31 NOTE — Telephone Encounter (Signed)
Patient is returning call.  °

## 2020-01-31 NOTE — Telephone Encounter (Signed)
OK to renew the medicines that he is currently taking

## 2020-01-31 NOTE — Telephone Encounter (Signed)
Left message for pt to call.

## 2020-01-31 NOTE — Telephone Encounter (Signed)
Spoke with patient and advised Janumet was refilled to [facility] yesterday by Judson Roch RN Explained that TSH has not been checked recently and levothyroxine has not been refilled per Epic since Jan 2021 -- will defer to [name] to advise on this refill Advised that imdur is not an active prescription on med list -- he states he got a refill yesterday for 60mg  tablets - taking 120mg  daily (9/20202 K. Kroeger PA visit)  -- was d/c'ed 06/07/2019 discharge summary but he never stopped  Patient needs refills to [facility]   Send to MD/RN to review refill request

## 2020-02-01 NOTE — Telephone Encounter (Signed)
LVM2CB need to clarify which medications he is taking that need renewal.

## 2020-02-09 ENCOUNTER — Other Ambulatory Visit: Payer: Self-pay

## 2020-02-09 MED ORDER — LEVOTHYROXINE SODIUM 50 MCG PO TABS: 50.0000 ug | ORAL_TABLET | Freq: Every day | ORAL | 0 refills | Status: DC

## 2020-02-09 MED ORDER — ISOSORBIDE MONONITRATE ER 120 MG PO TB24: 120.0000 mg | ORAL_TABLET | Freq: Every day | ORAL | 3 refills | Status: DC

## 2020-02-09 NOTE — Telephone Encounter (Signed)
Called and spoke with pt. He states that he is still taking the isosorbide. He reports that with his hospital visits and rehab all of his prescriptions with [facility] got messed up.  Notified I would update his med list to contain the isosorbide he has been taking and refill his synthroid. Pt verbalized understanding with no other questions at this time.

## 2020-02-09 NOTE — Telephone Encounter (Signed)
Clarification from pt, he states he has been taking 120mg s. He states that the script that was refilled this time was old and it was a 60mg  tablet. He would like a 120mg  tablet sent in and he is aware to take 1 tab daily. Will make dr.kelly aware.

## 2020-02-09 NOTE — Telephone Encounter (Signed)
LVM2CB, need clarification on dose and how he has been taking his isosorbide.

## 2020-02-09 NOTE — Telephone Encounter (Signed)
Follow up   Pt is returning call from [facility]

## 2020-02-11 ENCOUNTER — Other Ambulatory Visit: Payer: Self-pay | Admitting: Medical

## 2020-02-13 NOTE — Telephone Encounter (Signed)
This is Dr. Kelly's pt. °

## 2020-02-21 ENCOUNTER — Ambulatory Visit (HOSPITAL_COMMUNITY): Payer: Medicare Other

## 2020-03-16 ENCOUNTER — Encounter (HOSPITAL_COMMUNITY): Payer: Medicare Other

## 2020-03-16 ENCOUNTER — Ambulatory Visit: Payer: Medicare Other

## 2020-03-16 ENCOUNTER — Other Ambulatory Visit (HOSPITAL_COMMUNITY): Payer: Medicare Other

## 2020-03-16 NOTE — Progress Notes (Deleted)
[facility] [place] HISTORY AND PHYSICAL   History of Present Illness:  Patient is a 69 y.o. year old male who presents for evaluation of claudication.  The patient currently describes a cramping sensation in the {Desc; right/left/both:390000001} lower extremity. There {Is/is not:9024} rest pain.  There is no history of ulcerations on the feet.  Atherosclerotic risk factors and other medical problems include ***  Past Medical History:  Diagnosis Date  . Anxiety    off xanax  and paxil since 3/13  . Arthritis   . Bilateral carotid artery disease ([place])    s/p R ICA stent 03/28/2015 with distal protection. Known chronically occluded L ICA. Carotid stent complicated by hypotension and acute stroke in [place]  . Cancer ([place])    tumor basal cell rem from lft arm  . Cataract   . COPD (chronic obstructive pulmonary disease) ([place])   . Coronary artery disease    a. s/p CABG in 2011 with LIMA-LAD, SVG-RI/OM, and SVG-PDA b. occluded SVG-PDA by cath in 2015 c. 10/2016: NSTEMI with cath showing thrombus along the distal graft to insertion of SVG-OM2 with DES placed.   . CVA (cerebral infarction)    occured on 10/10 several days after R ICA carotid stenting  . Depression   . Diabetes mellitus   . GERD (gastroesophageal reflux disease)   . Heart attack ([place])   . High cholesterol   . Hypertension    type 2 NIDDM  . Myocardial infarct (H[facility])    x4 last 10 yrs  . Pneumonia    hx  . 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 Smoker   . Substance abuse [facility])     Past Surgical History:  Procedure Laterality Date  . ABDOMINAL AORTOGRAM W/LOWER EXTREMITY Bilateral 05/31/2019   Procedure: ABDOMINAL AORTOGRAM W/LOWER EXTREMITY;  Surgeon: Waynetta Sandy, MD;  Location: [place] [facility];  Service: Cardiovascular;  Laterality: Bilateral;  . AMPUTATION TOE Right 06/02/2019   Procedure: Amputation Right Great Toe and Second Toe;   Surgeon: Waynetta Sandy, MD;  Location: [place];  Service: Vascular;  Laterality: Right;  . BACK SURGERY  Jan 2014, Dec 2011   Dr Vertell Limber  . BYPASS GRAFT FEMORAL-PERONEAL Left 11/29/2019   Procedure: Left Bypass Graft Femoral-Peroneal using non reversed Greater Sapphenous vein;  Surgeon: Waynetta Sandy, MD;  Location: [place];  Service: Vascular;  Laterality: Left;  . CARDIAC CATHETERIZATION  4/12   Medical Rx  . CORONARY ANGIOGRAM  01/17/14   med rx  . CORONARY ANGIOGRAM  4/13   med Rx  . CORONARY ANGIOGRAM  1/15   Med Rx  . CORONARY ANGIOPLASTY  Jan 2004   RCA  . CORONARY ARTERY BYPASS GRAFT  07/24/2009   L-LAD, SVG-RI/OM, SVG-PDA  . CORONARY STENT INTERVENTION N/A 10/23/2016   Procedure: Coronary Stent Intervention;  Surgeon: Peter M Martinique, MD;  Location: [place] [facility];  Service: Cardiovascular;  Laterality: N/A;  . ENDARTERECTOMY FEMORAL Right 06/02/2019   Procedure: Endarterectomy External Iliac  Femoral Artery and Profunda;  Surgeon: Waynetta Sandy, MD;  Location: [place];  Service: Vascular;  Laterality: Right;  . EYE SURGERY     cat bil  . FEMORAL ARTERY - FEMORAL ARTERY BYPASS GRAFT Right 2004   femoral enarterectomy  . FEMORAL-POPLITEAL BYPASS GRAFT Right 06/02/2019   Procedure: RIGHT LEG BYPASS GRAFT FEMORAL-POPLITEAL ARTERY using Gore Propaten Vascular Graft Removable Ring;  Surgeon: Waynetta Sandy, MD;  Location: [facility];  Service: Vascular;  Laterality: Right;  . LEFT HEART CATH AND CORS/GRAFTS ANGIOGRAPHY N/A 10/23/2016   Procedure: Left Heart Cath and Cors/Grafts Angiography;  Surgeon: Peter M Martinique, MD;  Location: [place] [facility];  Service: Cardiovascular;  Laterality: N/A;  . LEFT HEART CATHETERIZATION WITH CORONARY ANGIOGRAM N/A 10/03/2011   Procedure: LEFT HEART CATHETERIZATION WITH CORONARY ANGIOGRAM;  Surgeon: Pixie Casino, MD;  Location: [facility] CATH LAB;  Service: Cardiovascular;  Laterality: N/A;  . LEFT HEART CATHETERIZATION  WITH CORONARY ANGIOGRAM N/A 07/08/2013   Procedure: LEFT HEART CATHETERIZATION WITH CORONARY ANGIOGRAM;  Surgeon: Blane Ohara, MD;  Location: [facility] CATH LAB;  Service: Cardiovascular;  Laterality: N/A;  . LEFT HEART CATHETERIZATION WITH CORONARY/GRAFT ANGIOGRAM N/A 01/17/2014   Procedure: LEFT HEART CATHETERIZATION WITH Beatrix Fetters;  Surgeon: Troy Sine, MD;  Location: [facility] CATH LAB;  Service: Cardiovascular;  Laterality: N/A;  . LOWER EXTREMITY ANGIOGRAPHY N/A 11/28/2019   Procedure: LOWER EXTREMITY ANGIOGRAPHY;  Surgeon: Waynetta Sandy, MD;  Location: [place] [facility];  Service: Cardiovascular;  Laterality: N/A;  . PATCH ANGIOPLASTY Right 06/02/2019   Procedure: Patch Angioplasty using Hemashield Platinum Finesse Patch of the External Iliac Femoral Artery and Profunda;  Surgeon: Waynetta Sandy, MD;  Location: [place];  Service: Vascular;  Laterality: Right;  . PERIPHERAL VASCULAR BALLOON ANGIOPLASTY Right 11/28/2019   Procedure: PERIPHERAL VASCULAR BALLOON ANGIOPLASTY;  Surgeon: Waynetta Sandy, MD;  Location: [place] [facility];  Service: Cardiovascular;  Laterality: Right;  Common femoral  . PERIPHERAL VASCULAR CATHETERIZATION N/A 03/28/2015   Procedure: Carotid PTA/Stent Intervention;  Surgeon: Lorretta Harp, MD;  Location: [place] [facility];  Service: Cardiovascular;  Laterality: N/A;  . PERIPHERAL VASCULAR INTERVENTION Left 11/28/2019   Procedure: PERIPHERAL VASCULAR INTERVENTION;  Surgeon: Waynetta Sandy, MD;  Location: [place] [facility];  Service: Cardiovascular;  Laterality: Left;  external iliac  . US EXTREMITY*L*     lft arm tumor removed     ROS:   General:  No weight loss, Fever, chills  HEENT: No recent headaches, no nasal bleeding, no visual changes, no sore throat  Neurologic: No dizziness, blackouts, seizures. No recent symptoms of stroke or mini- stroke. No recent episodes of slurred speech, or temporary  blindness.  Cardiac: No recent episodes of chest pain/pressure, no shortness of breath at rest.  No shortness of breath with exertion.  Denies history of atrial fibrillation or irregular heartbeat  Vascular: No history of rest pain in feet.  No history of claudication.  No history of non-healing ulcer, No history of DVT   Pulmonary: No home oxygen, no productive cough, no hemoptysis,  No asthma or wheezing  Musculoskeletal:  [ ]  Arthritis, [ ]  Low back pain,  [ ]  Joint pain  Hematologic:No history of hypercoagulable state.  No history of easy bleeding.  No history of anemia  Gastrointestinal: No hematochezia or melena,  No gastroesophageal reflux, no trouble swallowing  Urinary: [ ]  chronic Kidney disease, [ ]  on HD - [ ]  MWF or [ ]  TTHS, [ ]  Burning with urination, [ ]  Frequent urination, [ ]  Difficulty urinating;   Skin: No rashes  Psychological: No history of anxiety,  No history of depression  Social History Social History   Tobacco Use  . Smoking status: Current Every Day Smoker    Packs/day: 1.00    Years: 48.00    Pack years: 48.00    Types: Cigarettes  . Smokeless tobacco: Never Used  Vaping Use  . Vaping Use: Never used  Substance Use Topics  . Alcohol use: Yes    Alcohol/week: 0.0 standard drinks    Comment: socially  . Drug use: Not Currently    Family History Family History  Problem Relation Age of Onset  . Arthritis Mother   . Heart disease Father     Allergies  Allergies  Allergen Reactions  . Coreg [Carvedilol] Nausea And Vomiting and Other (See Comments)    Per patient made him dizzy and light sensitive  . Sulfa Antibiotics Nausea And Vomiting and Other (See Comments)    Also headaches      Current Outpatient Medications  Medication Sig Dispense Refill  . acetaminophen (TYLENOL) 325 MG tablet Take 650 mg by mouth every 6 (six) hours as needed for mild pain or headache.     Marland Kitchen aspirin 81 MG tablet Take 1 tablet (81 mg total) by mouth daily.     . clopidogrel (PLAVIX) 75 MG tablet TAKE 1 TABLET BY MOUTH EVERY DAY 90 tablet 3  . ferrous sulfate 325 (65 FE) MG tablet Take 1 tablet (325 mg total) by mouth daily with breakfast. 30 tablet 3  . gabapentin (NEURONTIN) 300 MG capsule Take 1 capsule (300 mg total) by mouth 2 (two) times daily. 60 capsule 1  . isosorbide mononitrate (IMDUR) 120 MG 24 hr tablet Take 1 tablet (120 mg total) by mouth daily. 90 tablet 3  . levothyroxine (SYNTHROID) 50 MCG tablet Take 1 tablet (50 mcg total) by mouth daily. 30 tablet 0  . loratadine (CLARITIN) 10 MG tablet Take 10 mg by mouth daily.    . metoprolol tartrate (LOPRESSOR) 25 MG tablet Take 0.5 tablets (12.5 mg total) by mouth 2 (two) times daily. 60 tablet 0  . nicotine (NICODERM CQ - DOSED IN MG/24 HOURS) 21 mg/24hr patch Place 1 patch (21 mg total) onto the skin daily. 28 patch 0  . nitroGLYCERIN (NITROSTAT) 0.4 MG SL tablet PLACE 1 TABLET (0.4 MG TOTAL) UNDER THE TONGUE EVERY 5 (FIVE) MINUTES times 3 then call MD (Patient taking differently: Place 0.4 mg under the tongue every 5 (five) minutes as needed for chest pain. ) 25 tablet 2  . pantoprazole (PROTONIX) 40 MG tablet Take 1 tablet (40 mg total) by mouth daily. 30 tablet 0  . ranolazine (RANEXA) 1000 MG SR tablet TAKE 1 TABLET BY MOUTH 2 TIMES DAILY. 180 tablet 1  . rosuvastatin (CRESTOR) 20 MG tablet Take 1 tablet (20 mg total) by mouth daily. 90 tablet 3  . sertraline (ZOLOFT) 100 MG tablet Take 2 and 1/2 tablets by mouth daily (Patient taking differently: Take 250 mg by mouth daily. ) 75 tablet 0  . sitaGLIPtin-metformin (JANUMET) 50-500 MG tablet TAKE 1 TABLET BY MOUTH TWICE A DAY WITH A MEAL. 180 tablet 3  . vitamin B-12 1000 MCG tablet Take 1 tablet (1,000 mcg total) by mouth daily.     No current facility-administered medications for this visit.    Physical Examination  There were no vitals filed for this visit.  There is no height or weight on file to calculate BMI.  General:  Alert  and oriented, no acute distress HEENT: Normal Neck: No bruit or JVD Pulmonary: Clear to auscultation bilaterally Cardiac: Regular Rate and Rhythm without murmur Abdomen: Soft, non-tender, non-distended, no mass, no scars Skin: No rash Extremity Pulses:  2+ radial, brachial, femoral, dorsalis pedis, posterior tibial pulses bilaterally Musculoskeletal: No deformity or edema  Neurologic: Upper and lower  extremity motor 5/5 and symmetric  DATA: ***   ASSESSMENT: ***   PLAN: ***   Ruta Hinds, MD [facility] [place] Office: [phone] Pager: [phone]

## 2020-05-23 DIAGNOSIS — Z86010 Personal history of colonic polyps: Secondary | ICD-10-CM

## 2020-05-23 DIAGNOSIS — Z860101 Personal history of adenomatous and serrated colon polyps: Secondary | ICD-10-CM

## 2020-05-23 HISTORY — DX: Personal history of colonic polyps: Z86.010

## 2020-05-23 HISTORY — DX: Personal history of adenomatous and serrated colon polyps: Z86.0101

## 2020-06-05 ENCOUNTER — Inpatient Hospital Stay (HOSPITAL_COMMUNITY)
Admission: EM | Admit: 2020-06-05 | Discharge: 2020-06-14 | DRG: 683 | Disposition: A | Discharge status: Home / Self Care | Payer: Medicare Other | Attending: Internal Medicine | Admitting: Internal Medicine

## 2020-06-05 ENCOUNTER — Other Ambulatory Visit: Payer: Self-pay

## 2020-06-05 ENCOUNTER — Encounter (HOSPITAL_COMMUNITY): Payer: Self-pay | Admitting: Emergency Medicine

## 2020-06-05 DIAGNOSIS — E785 Hyperlipidemia, unspecified: Secondary | ICD-10-CM | POA: Diagnosis present

## 2020-06-05 DIAGNOSIS — E871 Hypo-osmolality and hyponatremia: Secondary | ICD-10-CM | POA: Diagnosis not present

## 2020-06-05 DIAGNOSIS — K222 Esophageal obstruction: Secondary | ICD-10-CM | POA: Diagnosis present

## 2020-06-05 DIAGNOSIS — R197 Diarrhea, unspecified: Secondary | ICD-10-CM | POA: Diagnosis present

## 2020-06-05 DIAGNOSIS — Z7984 Long term (current) use of oral hypoglycemic drugs: Secondary | ICD-10-CM

## 2020-06-05 DIAGNOSIS — N189 Chronic kidney disease, unspecified: Secondary | ICD-10-CM | POA: Diagnosis present

## 2020-06-05 DIAGNOSIS — F32A Depression, unspecified: Secondary | ICD-10-CM | POA: Diagnosis present

## 2020-06-05 DIAGNOSIS — D72829 Elevated white blood cell count, unspecified: Secondary | ICD-10-CM | POA: Diagnosis present

## 2020-06-05 DIAGNOSIS — R1110 Vomiting, unspecified: Secondary | ICD-10-CM

## 2020-06-05 DIAGNOSIS — R6881 Early satiety: Secondary | ICD-10-CM | POA: Diagnosis present

## 2020-06-05 DIAGNOSIS — G8929 Other chronic pain: Secondary | ICD-10-CM

## 2020-06-05 DIAGNOSIS — L97509 Non-pressure chronic ulcer of other part of unspecified foot with unspecified severity: Secondary | ICD-10-CM

## 2020-06-05 DIAGNOSIS — R61 Generalized hyperhidrosis: Secondary | ICD-10-CM | POA: Diagnosis present

## 2020-06-05 DIAGNOSIS — M545 Low back pain, unspecified: Secondary | ICD-10-CM

## 2020-06-05 DIAGNOSIS — I5042 Chronic combined systolic (congestive) and diastolic (congestive) heart failure: Secondary | ICD-10-CM | POA: Diagnosis present

## 2020-06-05 DIAGNOSIS — K648 Other hemorrhoids: Secondary | ICD-10-CM | POA: Diagnosis present

## 2020-06-05 DIAGNOSIS — D123 Benign neoplasm of transverse colon: Secondary | ICD-10-CM | POA: Diagnosis present

## 2020-06-05 DIAGNOSIS — N19 Unspecified kidney failure: Secondary | ICD-10-CM

## 2020-06-05 DIAGNOSIS — E1151 Type 2 diabetes mellitus with diabetic peripheral angiopathy without gangrene: Secondary | ICD-10-CM | POA: Diagnosis present

## 2020-06-05 DIAGNOSIS — R64 Cachexia: Secondary | ICD-10-CM | POA: Diagnosis present

## 2020-06-05 DIAGNOSIS — E1122 Type 2 diabetes mellitus with diabetic chronic kidney disease: Secondary | ICD-10-CM | POA: Diagnosis present

## 2020-06-05 DIAGNOSIS — E860 Dehydration: Secondary | ICD-10-CM

## 2020-06-05 DIAGNOSIS — I708 Atherosclerosis of other arteries: Secondary | ICD-10-CM | POA: Diagnosis present

## 2020-06-05 DIAGNOSIS — Z89421 Acquired absence of other right toe(s): Secondary | ICD-10-CM

## 2020-06-05 DIAGNOSIS — Z882 Allergy status to sulfonamides status: Secondary | ICD-10-CM

## 2020-06-05 DIAGNOSIS — Z791 Long term (current) use of non-steroidal anti-inflammatories (NSAID): Secondary | ICD-10-CM

## 2020-06-05 DIAGNOSIS — Z20822 Contact with and (suspected) exposure to covid-19: Secondary | ICD-10-CM | POA: Diagnosis present

## 2020-06-05 DIAGNOSIS — L97519 Non-pressure chronic ulcer of other part of right foot with unspecified severity: Secondary | ICD-10-CM | POA: Diagnosis present

## 2020-06-05 DIAGNOSIS — Z7982 Long term (current) use of aspirin: Secondary | ICD-10-CM

## 2020-06-05 DIAGNOSIS — E440 Moderate protein-calorie malnutrition: Secondary | ICD-10-CM | POA: Insufficient documentation

## 2020-06-05 DIAGNOSIS — M549 Dorsalgia, unspecified: Secondary | ICD-10-CM | POA: Diagnosis present

## 2020-06-05 DIAGNOSIS — I252 Old myocardial infarction: Secondary | ICD-10-CM

## 2020-06-05 DIAGNOSIS — D125 Benign neoplasm of sigmoid colon: Secondary | ICD-10-CM

## 2020-06-05 DIAGNOSIS — Z8261 Family history of arthritis: Secondary | ICD-10-CM

## 2020-06-05 DIAGNOSIS — E039 Hypothyroidism, unspecified: Secondary | ICD-10-CM | POA: Diagnosis present

## 2020-06-05 DIAGNOSIS — Z8701 Personal history of pneumonia (recurrent): Secondary | ICD-10-CM

## 2020-06-05 DIAGNOSIS — I130 Hypertensive heart and chronic kidney disease with heart failure and stage 1 through stage 4 chronic kidney disease, or unspecified chronic kidney disease: Secondary | ICD-10-CM | POA: Diagnosis present

## 2020-06-05 DIAGNOSIS — Z79890 Hormone replacement therapy (postmenopausal): Secondary | ICD-10-CM

## 2020-06-05 DIAGNOSIS — Z8673 Personal history of transient ischemic attack (TIA), and cerebral infarction without residual deficits: Secondary | ICD-10-CM

## 2020-06-05 DIAGNOSIS — M1990 Unspecified osteoarthritis, unspecified site: Secondary | ICD-10-CM | POA: Diagnosis present

## 2020-06-05 DIAGNOSIS — N179 Acute kidney failure, unspecified: Secondary | ICD-10-CM | POA: Diagnosis not present

## 2020-06-05 DIAGNOSIS — E538 Deficiency of other specified B group vitamins: Secondary | ICD-10-CM | POA: Diagnosis present

## 2020-06-05 DIAGNOSIS — E876 Hypokalemia: Secondary | ICD-10-CM | POA: Diagnosis present

## 2020-06-05 DIAGNOSIS — K219 Gastro-esophageal reflux disease without esophagitis: Secondary | ICD-10-CM | POA: Diagnosis present

## 2020-06-05 DIAGNOSIS — Z955 Presence of coronary angioplasty implant and graft: Secondary | ICD-10-CM

## 2020-06-05 DIAGNOSIS — Z7902 Long term (current) use of antithrombotics/antiplatelets: Secondary | ICD-10-CM

## 2020-06-05 DIAGNOSIS — D122 Benign neoplasm of ascending colon: Secondary | ICD-10-CM

## 2020-06-05 DIAGNOSIS — D638 Anemia in other chronic diseases classified elsewhere: Secondary | ICD-10-CM | POA: Diagnosis present

## 2020-06-05 DIAGNOSIS — D509 Iron deficiency anemia, unspecified: Secondary | ICD-10-CM | POA: Diagnosis present

## 2020-06-05 DIAGNOSIS — R112 Nausea with vomiting, unspecified: Secondary | ICD-10-CM | POA: Diagnosis not present

## 2020-06-05 DIAGNOSIS — E11621 Type 2 diabetes mellitus with foot ulcer: Secondary | ICD-10-CM

## 2020-06-05 DIAGNOSIS — F17210 Nicotine dependence, cigarettes, uncomplicated: Secondary | ICD-10-CM | POA: Diagnosis present

## 2020-06-05 DIAGNOSIS — I2510 Atherosclerotic heart disease of native coronary artery without angina pectoris: Secondary | ICD-10-CM | POA: Diagnosis present

## 2020-06-05 DIAGNOSIS — I452 Bifascicular block: Secondary | ICD-10-CM | POA: Diagnosis present

## 2020-06-05 DIAGNOSIS — I25810 Atherosclerosis of coronary artery bypass graft(s) without angina pectoris: Secondary | ICD-10-CM | POA: Diagnosis present

## 2020-06-05 DIAGNOSIS — Z79899 Other long term (current) drug therapy: Secondary | ICD-10-CM

## 2020-06-05 DIAGNOSIS — R1310 Dysphagia, unspecified: Secondary | ICD-10-CM

## 2020-06-05 DIAGNOSIS — Z6821 Body mass index (BMI) 21.0-21.9, adult: Secondary | ICD-10-CM

## 2020-06-05 DIAGNOSIS — J449 Chronic obstructive pulmonary disease, unspecified: Secondary | ICD-10-CM | POA: Diagnosis present

## 2020-06-05 DIAGNOSIS — Z8249 Family history of ischemic heart disease and other diseases of the circulatory system: Secondary | ICD-10-CM

## 2020-06-05 DIAGNOSIS — F419 Anxiety disorder, unspecified: Secondary | ICD-10-CM | POA: Diagnosis present

## 2020-06-05 DIAGNOSIS — Z888 Allergy status to other drugs, medicaments and biological substances status: Secondary | ICD-10-CM

## 2020-06-05 HISTORY — DX: Basal cell carcinoma of skin, unspecified: C44.91

## 2020-06-05 LAB — CBC
HCT: 39.1 % (ref 39.0–52.0)
Hemoglobin: 12.3 g/dL — ABNORMAL LOW (ref 13.0–17.0)
MCH: 28.9 pg (ref 26.0–34.0)
MCHC: 31.5 g/dL (ref 30.0–36.0)
MCV: 91.8 fL (ref 80.0–100.0)
Platelets: 428 10*3/uL — ABNORMAL HIGH (ref 150–400)
RBC: 4.26 MIL/uL (ref 4.22–5.81)
RDW: 14.5 % (ref 11.5–15.5)
WBC: 19 10*3/uL — ABNORMAL HIGH (ref 4.0–10.5)
nRBC: 0 % (ref 0.0–0.2)

## 2020-06-05 LAB — COMPREHENSIVE METABOLIC PANEL
ALT: 10 U/L (ref 0–44)
AST: 10 U/L — ABNORMAL LOW (ref 15–41)
Albumin: 3.5 g/dL (ref 3.5–5.0)
Alkaline Phosphatase: 147 U/L — ABNORMAL HIGH (ref 38–126)
Anion gap: 18 — ABNORMAL HIGH (ref 5–15)
BUN: 26 mg/dL — ABNORMAL HIGH (ref 8–23)
CO2: 25 mmol/L (ref 22–32)
Calcium: 9.9 mg/dL (ref 8.9–10.3)
Chloride: 96 mmol/L — ABNORMAL LOW (ref 98–111)
Creatinine, Ser: 2.92 mg/dL — ABNORMAL HIGH (ref 0.61–1.24)
GFR, Estimated: 23 mL/min — ABNORMAL LOW (ref >60)
Glucose, Bld: 174 mg/dL — ABNORMAL HIGH (ref 70–99)
Potassium: 3.2 mmol/L — ABNORMAL LOW (ref 3.5–5.1)
Sodium: 139 mmol/L (ref 135–145)
Total Bilirubin: 0.5 mg/dL (ref 0.3–1.2)
Total Protein: 7.6 g/dL (ref 6.5–8.1)

## 2020-06-05 LAB — TROPONIN I (HIGH SENSITIVITY): Troponin I (High Sensitivity): 14 ng/L (ref <18)

## 2020-06-05 LAB — LIPASE, BLOOD: Lipase: 27 U/L (ref 11–51)

## 2020-06-05 NOTE — ED Triage Notes (Signed)
Pt to ED with c/o nausea, vomiting and chest pain off and on x's 1 month.    Unable to keep anything down  Pt also c/o shortness of breath and weakness

## 2020-06-06 ENCOUNTER — Emergency Department (HOSPITAL_COMMUNITY): Payer: Medicare Other

## 2020-06-06 DIAGNOSIS — N179 Acute kidney failure, unspecified: Secondary | ICD-10-CM | POA: Diagnosis present

## 2020-06-06 LAB — URINALYSIS, ROUTINE W REFLEX MICROSCOPIC
Bilirubin Urine: NEGATIVE
Glucose, UA: NEGATIVE mg/dL
Hgb urine dipstick: NEGATIVE
Ketones, ur: NEGATIVE mg/dL
Leukocytes,Ua: NEGATIVE
Nitrite: NEGATIVE
Protein, ur: 30 mg/dL — AB
Specific Gravity, Urine: 1.023 (ref 1.005–1.030)
pH: 5 (ref 5.0–8.0)

## 2020-06-06 LAB — CBC
HCT: 33.2 % — ABNORMAL LOW (ref 39.0–52.0)
Hemoglobin: 10.3 g/dL — ABNORMAL LOW (ref 13.0–17.0)
MCH: 28.7 pg (ref 26.0–34.0)
MCHC: 31 g/dL (ref 30.0–36.0)
MCV: 92.5 fL (ref 80.0–100.0)
Platelets: 336 10*3/uL (ref 150–400)
RBC: 3.59 MIL/uL — ABNORMAL LOW (ref 4.22–5.81)
RDW: 14.5 % (ref 11.5–15.5)
WBC: 15 10*3/uL — ABNORMAL HIGH (ref 4.0–10.5)
nRBC: 0 % (ref 0.0–0.2)

## 2020-06-06 LAB — BASIC METABOLIC PANEL
Anion gap: 12 (ref 5–15)
BUN: 28 mg/dL — ABNORMAL HIGH (ref 8–23)
CO2: 23 mmol/L (ref 22–32)
Calcium: 8.7 mg/dL — ABNORMAL LOW (ref 8.9–10.3)
Chloride: 102 mmol/L (ref 98–111)
Creatinine, Ser: 2.79 mg/dL — ABNORMAL HIGH (ref 0.61–1.24)
GFR, Estimated: 24 mL/min — ABNORMAL LOW (ref >60)
Glucose, Bld: 138 mg/dL — ABNORMAL HIGH (ref 70–99)
Potassium: 3.6 mmol/L (ref 3.5–5.1)
Sodium: 137 mmol/L (ref 135–145)

## 2020-06-06 LAB — RESP PANEL BY RT-PCR (FLU A&B, COVID) ARPGX2
Influenza A by PCR: NEGATIVE
Influenza B by PCR: NEGATIVE
SARS Coronavirus 2 by RT PCR: NEGATIVE

## 2020-06-06 LAB — ETHANOL: Alcohol, Ethyl (B): <10 mg/dL (ref <10)

## 2020-06-06 LAB — TROPONIN I (HIGH SENSITIVITY): Troponin I (High Sensitivity): 17 ng/L (ref <18)

## 2020-06-06 LAB — HEMOGLOBIN A1C
Hgb A1c MFr Bld: 6.5 % — ABNORMAL HIGH (ref 4.8–5.6)
Mean Plasma Glucose: 139.85 mg/dL

## 2020-06-06 MED ORDER — CLOPIDOGREL BISULFATE 75 MG PO TABS
75.0000 mg | ORAL_TABLET | Freq: Every day | ORAL | Status: DC
  Administered 2020-06-06 – 2020-06-08 (×3): 75 mg via ORAL
  Filled 2020-06-06 (×3): qty 1

## 2020-06-06 MED ORDER — METOPROLOL TARTRATE 12.5 MG HALF TABLET
12.5000 mg | ORAL_TABLET | Freq: Two times a day (BID) | ORAL | Status: DC
  Administered 2020-06-06 – 2020-06-14 (×17): 12.5 mg via ORAL
  Filled 2020-06-06 (×18): qty 1

## 2020-06-06 MED ORDER — METOCLOPRAMIDE HCL 10 MG PO TABS: 10.0000 mg | ORAL_TABLET | Freq: Four times a day (QID) | ORAL | 0 refills | Status: DC

## 2020-06-06 MED ORDER — SODIUM CHLORIDE 0.9 % IV SOLN
1000.0000 mL | INTRAVENOUS | Status: DC
  Administered 2020-06-06: 13:00:00 1000 mL via INTRAVENOUS

## 2020-06-06 MED ORDER — ISOSORBIDE MONONITRATE ER 60 MG PO TB24
120.0000 mg | ORAL_TABLET | Freq: Every day | ORAL | Status: DC
  Administered 2020-06-06 – 2020-06-14 (×9): 120 mg via ORAL
  Filled 2020-06-06 (×6): qty 2
  Filled 2020-06-06 (×2): qty 4
  Filled 2020-06-06: qty 2

## 2020-06-06 MED ORDER — ALUM & MAG HYDROXIDE-SIMETH 200-200-20 MG/5ML PO SUSP
30.0000 mL | Freq: Once | ORAL | Status: AC
  Administered 2020-06-06: 05:00:00 30 mL via ORAL
  Filled 2020-06-06: qty 30

## 2020-06-06 MED ORDER — LEVOTHYROXINE SODIUM 50 MCG PO TABS
50.0000 ug | ORAL_TABLET | Freq: Every day | ORAL | Status: DC
  Administered 2020-06-07 – 2020-06-14 (×8): 50 ug via ORAL
  Filled 2020-06-06 (×8): qty 1

## 2020-06-06 MED ORDER — ONDANSETRON HCL 4 MG/2ML IJ SOLN
4.0000 mg | Freq: Four times a day (QID) | INTRAMUSCULAR | Status: DC | PRN
  Administered 2020-06-07 – 2020-06-09 (×3): 4 mg via INTRAVENOUS
  Filled 2020-06-06 (×3): qty 2

## 2020-06-06 MED ORDER — SERTRALINE HCL 100 MG PO TABS
200.0000 mg | ORAL_TABLET | Freq: Every day | ORAL | Status: DC
  Administered 2020-06-06 – 2020-06-13 (×8): 200 mg via ORAL
  Filled 2020-06-06 (×9): qty 2

## 2020-06-06 MED ORDER — SODIUM CHLORIDE 0.9 % IV BOLUS
1000.0000 mL | Freq: Once | INTRAVENOUS | Status: AC
  Administered 2020-06-06: 16:00:00 1000 mL via INTRAVENOUS

## 2020-06-06 MED ORDER — HYOSCYAMINE SULFATE 0.125 MG SL SUBL
0.2500 mg | SUBLINGUAL_TABLET | Freq: Once | SUBLINGUAL | Status: AC
  Administered 2020-06-06: 05:00:00 0.25 mg via SUBLINGUAL
  Filled 2020-06-06: qty 2

## 2020-06-06 MED ORDER — FERROUS SULFATE 325 (65 FE) MG PO TABS
325.0000 mg | ORAL_TABLET | Freq: Every day | ORAL | Status: DC
  Administered 2020-06-07 – 2020-06-14 (×7): 325 mg via ORAL
  Filled 2020-06-06 (×7): qty 1

## 2020-06-06 MED ORDER — HEPARIN SODIUM (PORCINE) 5000 UNIT/ML IJ SOLN
5000.0000 [IU] | Freq: Three times a day (TID) | INTRAMUSCULAR | Status: DC
  Administered 2020-06-06 – 2020-06-14 (×24): 5000 [IU] via SUBCUTANEOUS
  Filled 2020-06-06 (×24): qty 1

## 2020-06-06 MED ORDER — ASPIRIN EC 81 MG PO TBEC
81.0000 mg | DELAYED_RELEASE_TABLET | Freq: Every day | ORAL | Status: DC
  Administered 2020-06-06 – 2020-06-14 (×9): 81 mg via ORAL
  Filled 2020-06-06 (×9): qty 1

## 2020-06-06 MED ORDER — PANTOPRAZOLE SODIUM 40 MG PO TBEC
40.0000 mg | DELAYED_RELEASE_TABLET | Freq: Every day | ORAL | Status: DC
  Administered 2020-06-06 – 2020-06-08 (×3): 40 mg via ORAL
  Filled 2020-06-06 (×3): qty 1

## 2020-06-06 MED ORDER — SODIUM CHLORIDE 0.9 % IV BOLUS (SEPSIS)
1000.0000 mL | Freq: Once | INTRAVENOUS | Status: AC
  Administered 2020-06-06: 05:00:00 1000 mL via INTRAVENOUS

## 2020-06-06 MED ORDER — GABAPENTIN 300 MG PO CAPS
300.0000 mg | ORAL_CAPSULE | Freq: Two times a day (BID) | ORAL | Status: DC
  Administered 2020-06-06 – 2020-06-14 (×17): 300 mg via ORAL
  Filled 2020-06-06 (×17): qty 1

## 2020-06-06 MED ORDER — ONDANSETRON HCL 4 MG PO TABS: 4.0000 mg | ORAL_TABLET | Freq: Four times a day (QID) | ORAL | Status: DC | PRN

## 2020-06-06 MED ORDER — LORATADINE 10 MG PO TABS
10.0000 mg | ORAL_TABLET | Freq: Every day | ORAL | Status: DC
  Administered 2020-06-06 – 2020-06-14 (×9): 10 mg via ORAL
  Filled 2020-06-06 (×9): qty 1

## 2020-06-06 MED ORDER — METOCLOPRAMIDE HCL 5 MG/ML IJ SOLN
10.0000 mg | Freq: Once | INTRAMUSCULAR | Status: AC
  Administered 2020-06-06: 05:00:00 10 mg via INTRAVENOUS
  Filled 2020-06-06: qty 2

## 2020-06-06 MED ORDER — ACETAMINOPHEN 325 MG PO TABS: 650.0000 mg | ORAL_TABLET | Freq: Four times a day (QID) | ORAL | Status: DC | PRN

## 2020-06-06 MED ORDER — ROSUVASTATIN CALCIUM 20 MG PO TABS
20.0000 mg | ORAL_TABLET | Freq: Every day | ORAL | Status: DC
  Administered 2020-06-06 – 2020-06-14 (×9): 20 mg via ORAL
  Filled 2020-06-06 (×9): qty 1

## 2020-06-06 MED ORDER — LIDOCAINE VISCOUS HCL 2 % MT SOLN
15.0000 mL | Freq: Once | OROMUCOSAL | Status: AC
  Administered 2020-06-06: 05:00:00 15 mL via ORAL
  Filled 2020-06-06: qty 15

## 2020-06-06 MED ORDER — RANOLAZINE ER 500 MG PO TB12
1000.0000 mg | ORAL_TABLET | Freq: Two times a day (BID) | ORAL | Status: DC
  Administered 2020-06-06 – 2020-06-14 (×16): 1000 mg via ORAL
  Filled 2020-06-06 (×21): qty 2

## 2020-06-06 MED ORDER — DOCUSATE SODIUM 100 MG PO CAPS
100.0000 mg | ORAL_CAPSULE | Freq: Two times a day (BID) | ORAL | Status: DC
  Administered 2020-06-07 – 2020-06-14 (×12): 100 mg via ORAL
  Filled 2020-06-06 (×14): qty 1

## 2020-06-06 MED ORDER — VITAMIN B-12 1000 MCG PO TABS
1000.0000 ug | ORAL_TABLET | Freq: Every day | ORAL | Status: DC
  Administered 2020-06-06 – 2020-06-14 (×10): 1000 ug via ORAL
  Filled 2020-06-06 (×10): qty 1

## 2020-06-06 MED ORDER — SODIUM CHLORIDE 0.9 % IV SOLN: 1000.0000 mL | INTRAVENOUS | Status: DC

## 2020-06-06 MED ORDER — DIPHENHYDRAMINE HCL 25 MG PO CAPS: 50.0000 mg | ORAL_CAPSULE | Freq: Every evening | ORAL | Status: DC | PRN

## 2020-06-06 NOTE — ED Provider Notes (Signed)
[facility] EMERGENCY DEPARTMENT Provider Note  CSN: [id number] Arrival date & time: 06/05/20 1938  Chief Complaint(s) Emesis  HPI Jason [name] is a 69 y.o. male with a past medical history listed below including type 2 diabetes who presents to the emergency department with 1 month of on and off feeling of early cessation and possible food impaction followed by emesis several days later.  He reports that the vomitus includes foods he ate several days ago.  Denies any blood.  No associated abdominal pain.  He does report increased heartburn.  Patient reports decreased oral intake due to the symptoms.  Reports that he still having bowel movements and passing gas.  No recent fevers or infections.  He does endorse chronic cough.  Reports that he smokes daily.  Denies any alcohol use.  Did admit to using crack cocaine, last of which was a month ago.  Denied any other illicit drug use.  No other physical complaints.  HPI  Past Medical History Past Medical History:  Diagnosis Date  . Anxiety    off xanax  and paxil since 3/13  . Arthritis   . Bilateral carotid artery disease ([place])    s/p R ICA stent 03/28/2015 with distal protection. Known chronically occluded L ICA. Carotid stent complicated by hypotension and acute stroke in [place]  . Cancer ([place])    tumor basal cell rem from lft arm  . Cataract   . COPD (chronic obstructive pulmonary disease) ([place])   . Coronary artery disease    a. s/p CABG in 2011 with LIMA-LAD, SVG-RI/OM, and SVG-PDA b. occluded SVG-PDA by cath in 2015 c. 10/2016: NSTEMI with cath showing thrombus along the distal graft to insertion of SVG-OM2 with DES placed.   . CVA (cerebral infarction)    occured on 10/10 several days after R ICA carotid stenting  . Depression   . Diabetes mellitus   . GERD (gastroesophageal reflux disease)   . Heart attack ([place])   . High cholesterol   . Hypertension    type 2 NIDDM  . Myocardial infarct  (HCC)    x4 last 10 yrs  . Pneumonia    hx  . 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 Smoker   . Substance abuse [facility])    Patient Active Problem List   Diagnosis Date Noted  . Pressure injury of skin 12/04/2019  . Critical lower limb ischemia ([place]) 11/26/2019  . Wound of lower extremity 11/26/2019  . Toe fracture, right 11/26/2019  . Amputation of right great toe ([place]) 07/11/2019  . Nicotine abuse 06/12/2019  . Cellulitis of right foot 06/11/2019  . Diabetic foot infection ([place]) 06/11/2019  . PAD (peripheral artery disease) ([place]) 06/11/2019  . Cellulitis 05/27/2019  . Gangrene of toe ([place]) 05/27/2019  . Status post lumbar spinal fusion 10/27/2017  . Acute on chronic combined systolic and diastolic CHF (congestive heart failure) ([place]) 10/25/2016  . NSTEMI (non-ST elevated myocardial infarction) ([place]) 10/22/2016  . Hypothyroidism 08/17/2015  . RBBB 05/11/2015  . Hyponatremia 04/04/2015  . Hypotension 04/04/2015  . Hemispheric carotid artery syndrome   . Carotid artery narrowing 03/28/2015  . Carotid stenosis- moderate 2011, 95% 2016 s/p stent 02/21/2014  . Unstable angina ([place]) 01/16/2014  . Tobacco abuse 01/16/2014  . Substance abuse in remission ([place]) 10/01/2012  . Radiculitis 02/10/2012  . CAD, CABG Feb 2011, cath x 4 since-medical Rx 10/03/2011  . PVD, hx Rt femoral endarterectomy 2004 10/03/2011  .  DM (diabetes mellitus) ([place]) 10/02/2011  . HTN (hypertension) 10/02/2011  . Hyperlipidemia 10/02/2011   Home Medication(s) Prior to Admission medications   Medication Sig Start Date End Date Taking? Authorizing Provider  acetaminophen (TYLENOL) 325 MG tablet Take 650 mg by mouth every 6 (six) hours as needed for mild pain or headache.     [provider]  aspirin 81 MG tablet Take 1 tablet (81 mg total) by mouth daily. 10/26/16   Lyda Jester M, PA-C  clopidogrel (PLAVIX) 75 MG tablet TAKE 1 TABLET BY MOUTH EVERY DAY 02/13/20    Troy Sine, MD  ferrous sulfate 325 (65 FE) MG tablet Take 1 tablet (325 mg total) by mouth daily with breakfast. 12/08/19   Hosie Poisson, MD  gabapentin (NEURONTIN) 300 MG capsule Take 1 capsule (300 mg total) by mouth 2 (two) times daily. 12/07/19   Hosie Poisson, MD  isosorbide mononitrate (IMDUR) 120 MG 24 hr tablet Take 1 tablet (120 mg total) by mouth daily. 02/09/20   Troy Sine, MD  levothyroxine (SYNTHROID) 50 MCG tablet Take 1 tablet (50 mcg total) by mouth daily. 02/09/20   Troy Sine, MD  loratadine (CLARITIN) 10 MG tablet Take 10 mg by mouth daily.    [provider]  metoprolol tartrate (LOPRESSOR) 25 MG tablet Take 0.5 tablets (12.5 mg total) by mouth 2 (two) times daily. 12/07/19   Hosie Poisson, MD  nicotine (NICODERM CQ - DOSED IN MG/24 HOURS) 21 mg/24hr patch Place 1 patch (21 mg total) onto the skin daily. 12/08/19   Hosie Poisson, MD  nitroGLYCERIN (NITROSTAT) 0.4 MG SL tablet PLACE 1 TABLET (0.4 MG TOTAL) UNDER THE TONGUE EVERY 5 (FIVE) MINUTES times 3 then call MD Patient taking differently: Place 0.4 mg under the tongue every 5 (five) minutes as needed for chest pain.  08/29/19   Kroeger, Lorelee Cover., PA-C  pantoprazole (PROTONIX) 40 MG tablet Take 1 tablet (40 mg total) by mouth daily. 12/08/19   Hosie Poisson, MD  ranolazine (RANEXA) 1000 MG SR tablet TAKE 1 TABLET BY MOUTH 2 TIMES DAILY. 01/25/20   Troy Sine, MD  rosuvastatin (CRESTOR) 20 MG tablet Take 1 tablet (20 mg total) by mouth daily. 08/29/19   Kroeger, Lorelee Cover., PA-C  sertraline (ZOLOFT) 100 MG tablet Take 2 and 1/2 tablets by mouth daily Patient taking differently: Take 250 mg by mouth daily.  07/04/19   Hennie Duos, MD  sitaGLIPtin-metformin (JANUMET) 50-500 MG tablet TAKE 1 TABLET BY MOUTH TWICE A DAY WITH A MEAL. 01/30/20   Troy Sine, MD  vitamin B-12 1000 MCG tablet Take 1 tablet (1,000 mcg total) by mouth daily. 12/08/19   Hosie Poisson, MD                                                                                                                                     Past Surgical History Past Surgical  History:  Procedure Laterality Date  . ABDOMINAL AORTOGRAM W/LOWER EXTREMITY Bilateral 05/31/2019   Procedure: ABDOMINAL AORTOGRAM W/LOWER EXTREMITY;  Surgeon: Waynetta Sandy, MD;  Location: [place] CV LAB;  Service: Cardiovascular;  Laterality: Bilateral;  . AMPUTATION TOE Right 06/02/2019   Procedure: Amputation Right Great Toe and Second Toe;  Surgeon: Waynetta Sandy, MD;  Location: [facility];  Service: Vascular;  Laterality: Right;  . BACK SURGERY  Jan 2014, Dec 2011   Dr Vertell Limber  . BYPASS GRAFT FEMORAL-PERONEAL Left 11/29/2019   Procedure: Left Bypass Graft Femoral-Peroneal using non reversed Greater Sapphenous vein;  Surgeon: Waynetta Sandy, MD;  Location: [facility];  Service: Vascular;  Laterality: Left;  . CARDIAC CATHETERIZATION  4/12   Medical Rx  . CORONARY ANGIOGRAM  01/17/14   med rx  . CORONARY ANGIOGRAM  4/13   med Rx  . CORONARY ANGIOGRAM  1/15   Med Rx  . CORONARY ANGIOPLASTY  Jan 2004   RCA  . CORONARY ARTERY BYPASS GRAFT  07/24/2009   L-LAD, SVG-RI/OM, SVG-PDA  . CORONARY STENT INTERVENTION N/A 10/23/2016   Procedure: Coronary Stent Intervention;  Surgeon: Peter M Martinique, MD;  Location: [facility] CV LAB;  Service: Cardiovascular;  Laterality: N/A;  . ENDARTERECTOMY FEMORAL Right 06/02/2019   Procedure: Endarterectomy External Iliac  Femoral Artery and Profunda;  Surgeon: Waynetta Sandy, MD;  Location: [facility];  Service: Vascular;  Laterality: Right;  . EYE SURGERY     cat bil  . FEMORAL ARTERY - FEMORAL ARTERY BYPASS GRAFT Right 2004   femoral enarterectomy  . FEMORAL-POPLITEAL BYPASS GRAFT Right 06/02/2019   Procedure: RIGHT LEG BYPASS GRAFT FEMORAL-POPLITEAL ARTERY using Gore Propaten Vascular Graft Removable Ring;  Surgeon: Waynetta Sandy, MD;  Location: [facility] [place];  Service: Vascular;  Laterality: Right;   . LEFT HEART CATH AND CORS/GRAFTS ANGIOGRAPHY N/A 10/23/2016   Procedure: Left Heart Cath and Cors/Grafts Angiography;  Surgeon: Peter M Martinique, MD;  Location: [facility] CV LAB;  Service: Cardiovascular;  Laterality: N/A;  . LEFT HEART CATHETERIZATION WITH CORONARY ANGIOGRAM N/A 10/03/2011   Procedure: LEFT HEART CATHETERIZATION WITH CORONARY ANGIOGRAM;  Surgeon: Pixie Casino, MD;  Location[facility] CATH LAB;  Service: Cardiovascular;  Laterality: N/A;  . LEFT HEART CATHETERIZATION WITH CORONARY ANGIOGRAM N/A 07/08/2013   Procedure: LEFT HEART CATHETERIZATION WITH CORONARY ANGIOGRAM;  Surgeon: Blane Ohara, MD;  Location: [facility] CATH LAB;  Service: Cardiovascular;  Laterality: N/A;  . LEFT HEART CATHETERIZATION WITH CORONARY/GRAFT ANGIOGRAM N/A 01/17/2014   Procedure: LEFT HEART CATHETERIZATION WITH Beatrix Fetters;  Surgeon: Troy Sine, MD;  Location: [facility] CATH LAB;  Service: Cardiovascular;  Laterality: N/A;  . LOWER EXTREMITY ANGIOGRAPHY N/A 11/28/2019   Procedure: LOWER EXTREMITY ANGIOGRAPHY;  Surgeon: Waynetta Sandy, MD;  Location: [facility] CV LAB;  Service: Cardiovascular;  Laterality: N/A;  . PATCH ANGIOPLASTY Right 06/02/2019   Procedure: Patch Angioplasty using Hemashield Platinum Finesse Patch of the External Iliac Femoral Artery and Profunda;  Surgeon: Waynetta Sandy, MD;  Location: [facility];  Service: Vascular;  Laterality: Right;  . PERIPHERAL VASCULAR BALLOON ANGIOPLASTY Right 11/28/2019   Procedure: PERIPHERAL VASCULAR BALLOON ANGIOPLASTY;  Surgeon: Waynetta Sandy, MD;  Location: [place] CV LAB;  Service: Cardiovascular;  Laterality: Right;  Common femoral  . PERIPHERAL VASCULAR CATHETERIZATION N/A 03/28/2015   Procedure: Carotid PTA/Stent Intervention;  Surgeon: Lorretta Harp, MD;  Location: [place] CV LAB;  Service: Cardiovascular;  Laterality: N/A;  . PERIPHERAL VASCULAR INTERVENTION Left  11/28/2019   Procedure: PERIPHERAL VASCULAR  INTERVENTION;  Surgeon: Waynetta Sandy, MD;  Location: [place] CV LAB;  Service: Cardiovascular;  Laterality: Left;  external iliac  . US EXTREMITY*L*     lft arm tumor removed    Family History Family History  Problem Relation Age of Onset  . Arthritis Mother   . Heart disease Father     Social History Social History   Tobacco Use  . Smoking status: Current Every Day Smoker    Packs/day: 1.00    Years: 48.00    Pack years: 48.00    Types: Cigarettes  . Smokeless tobacco: Never Used  Vaping Use  . Vaping Use: Never used  Substance Use Topics  . Alcohol use: Yes    Alcohol/week: 0.0 standard drinks    Comment: socially  . Drug use: Not Currently   Allergies Coreg [carvedilol] and Sulfa antibiotics  Review of Systems Review of Systems All other systems are reviewed and are negative for acute change except as noted in the HPI  Physical Exam Vital Signs  I have reviewed the triage vital signs BP 138/68 (BP Location: Right Arm)   Pulse 84   Temp 97.9 F (36.6 C) (Oral)   Resp (!) 21   Ht 5\' 8"  (1.727 m)   Wt 72.6 kg   SpO2 100%   BMI 24.33 kg/m   Physical Exam Vitals reviewed.  Constitutional:      General: He is not in acute distress.    Appearance: He is well-developed and well-nourished. He is not diaphoretic.  HENT:     Head: Normocephalic and atraumatic.     Nose: Nose normal.  Eyes:     General: No scleral icterus.       Right eye: No discharge.        Left eye: No discharge.     Extraocular Movements: EOM normal.     Conjunctiva/sclera: Conjunctivae normal.     Pupils: Pupils are equal, round, and reactive to light.  Cardiovascular:     Rate and Rhythm: Normal rate and regular rhythm.     Heart sounds: No murmur heard. No friction rub. No gallop.   Pulmonary:     Effort: Pulmonary effort is normal. No respiratory distress.     Breath sounds: Normal breath sounds. No stridor. No rales.  Chest:    Abdominal:     General: There  is no distension.     Palpations: Abdomen is soft.     Tenderness: There is no abdominal tenderness.  Musculoskeletal:        General: No tenderness or edema.     Cervical back: Normal range of motion and neck supple.  Skin:    General: Skin is warm and dry.     Findings: No erythema or rash.  Neurological:     Mental Status: He is alert and oriented to person, place, and time.  Psychiatric:        Mood and Affect: Mood and affect normal.     ED Results and Treatments Labs (all labs ordered are listed, but only abnormal results are displayed) Labs Reviewed  COMPREHENSIVE METABOLIC PANEL - Abnormal; Notable for the following components:      Result Value   Potassium 3.2 (*)    Chloride 96 (*)    Glucose, Bld 174 (*)    BUN 26 (*)    Creatinine, Ser 2.92 (*)    AST 10 (*)    Alkaline Phosphatase 147 (*)  GFR, Estimated 23 (*)    Anion gap 18 (*)    All other components within normal limits  CBC - Abnormal; Notable for the following components:   WBC 19.0 (*)    Hemoglobin 12.3 (*)    Platelets 428 (*)    All other components within normal limits  URINALYSIS, ROUTINE W REFLEX MICROSCOPIC - Abnormal; Notable for the following components:   Color, Urine AMBER (*)    APPearance CLOUDY (*)    Protein, ur 30 (*)    Bacteria, UA RARE (*)    All other components within normal limits  LIPASE, BLOOD  RAPID URINE DRUG SCREEN, HOSP PERFORMED  ETHANOL  TROPONIN I (HIGH SENSITIVITY)  TROPONIN I (HIGH SENSITIVITY)                                                                                                                         EKG  EKG Interpretation  Date/Time:  Tuesday June 05 2020 20:36:12 EST Ventricular Rate:  98 PR Interval:  134 QRS Duration: 142 QT Interval:  412 QTC Calculation: 525 R Axis:   179 Text Interpretation: Sinus rhythm with occasional Premature ventricular complexes Right bundle branch block Left posterior fascicular block ** Bifascicular block  ** Abnormal ECG No acute changes Confirmed by Addison Lank ([id number]) on 06/06/2020 4:16:23 AM      Radiology No results found.  Pertinent labs & imaging results that were available during my care of the patient were reviewed by me and considered in my medical decision making (see chart for details).  Medications Ordered in ED Medications  sodium chloride 0.9 % bolus 1,000 mL (has no administration in time range)    Followed by  sodium chloride 0.9 % bolus 1,000 mL (has no administration in time range)    Followed by  0.9 %  sodium chloride infusion (has no administration in time range)  metoCLOPramide (REGLAN) injection 10 mg (has no administration in time range)  alum & mag hydroxide-simeth (MAALOX/MYLANTA) 200-200-20 MG/5ML suspension 30 mL (has no administration in time range)    And  lidocaine (XYLOCAINE) 2 % viscous mouth solution 15 mL (has no administration in time range)  hyoscyamine (LEVSIN SL) SL tablet 0.25 mg (has no administration in time range)  Procedures Procedures  (including critical care time)  Medical Decision Making / ED Course I have reviewed the nursing notes for this encounter and the patient's prior records (if available in EHR or on provided paperwork).   Jason [name] was evaluated in Emergency Department on 06/06/2020 for the symptoms described in the history of present illness. He was evaluated in the context of the global COVID-19 pandemic, which necessitated consideration that the patient might be at risk for infection with the SARS-CoV-2 virus that causes COVID-19. Institutional protocols and algorithms that pertain to the evaluation of patients at risk for COVID-19 are in a state of rapid change based on information released by regulatory bodies including the CDC and federal and state organizations. These policies and  algorithms were followed during the patient's care in the ED.  Patient presents with early cessation followed by emesis.  Abdomen benign on exam. He is endorsing increased heartburn.  EKG without acute ischemic changes. Serial troponins negative x2. Low suspicion for ACS.  Doubt PE or dissection.  CBC notable for leukocytosis -etiology none at this time but may be secondary to stress reaction from emesis vs hemoconcentration (compared to prior CBCs) given dehydration.  Metabolic panel also notable for AKI likely from dehydration.  Patient does have hyperglycemia and elevated anion gap but bicarb is greater than 25 and not consistent with DKA.  Likely dehydration ketoacidosis.  UA without ketones or glucose urea.  No infection.  No evidence of biliary obstruction or pancreatitis.  We will obtain acute abdominal series with chest x-ray.  Also provide him with IV fluids, Reglan and GI cocktail.  Will repeat labs following treatment and p.o. challenge.  Patient care turned over to Dr Rogene Houston. Patient case and results discussed in detail; please see their note for further ED managment.         Final Clinical Impression(s) / ED Diagnoses Final diagnoses:  Emesis  AKI (acute kidney injury) ([place])  Dehydration      This chart was dictated using voice recognition software.  Despite best efforts to proofread,  errors can occur which can change the documentation meaning.   Fatima Blank, MD 06/06/20 [phone]

## 2020-06-06 NOTE — ED Notes (Signed)
Patient given dinner tray.

## 2020-06-06 NOTE — ED Provider Notes (Signed)
Patient lives by himself. Now having some onset of diarrhea. Has not had any vomiting recently. Renal function improving some with the fluids. But still as of this morning still had a creatinine of 2.79 which is fairly off from his baseline. In addition although patient feeling little bit better he still feels in general weak. Has some chronic back problems. Feels best probably at this point to go ahead and admit him and continue IV fluids. Until the creatinine improves until his strength gets back.   Fredia Sorrow, MD 06/06/20 1152

## 2020-06-06 NOTE — Hospital Course (Addendum)
Jason [name] is 69 year old male with a history of anxiety, PAD, CAD s/p CABG 7902 combined systolic and diastolic heart failure, hypothyroidism, diabetes mellitus, pretension, and hyperlipidemia who presented with 1 month history of a early sensation of fullness, possible food impaction , and nausea and vomiting which improved with GI cocktail, noted to have an AKI.    AKI on CKD- Improved  Patient has a history of uncontrolled diabetes and uncontrolled hypertension, will intermittently take his medications.  Cr of 2.92 on admission, improved mildly to 2.79 with fluids. Creatinine is unclear, seem to be around 1.1 with a small AKI in June 2021.  Patient has been having approximately 1 month history of nausea and emesis and decreased p.o. intake, also endorsed loose stools.  He also endorses a daily use of ibuprofen about 4-5 tabs twice a day for multiple years for his chronic back pain.  Does endorse some mild slowing of his urine however denies any straining, difficulty emptying, dysuria, hematuria, or abdominal pain.  Urinalysis done showed 30 protein, negative for nitrates, hemoglobin, ketones, or leukocytes.  Patient does appear volume down on exam, this could be prerenal secondary to decreased p.o. intake however this also could be progression due to to his ibuprofen use, uncontrolled hypertension, and diabetes. Received IVF and improved.     Nausea/vomiting, early satiety: Concerning for PUD (2/2 to chronic NSAID use) versus gastroparesis versus malignancy (given extensive smoking history). Obtained stool studies to rule out infectious causes versus malabsorption, were normal. CTA obtained due to extensive vascular disease history and postprandial symptoms; results negative for mesenteric ischemia. Gi consulted and ss per GI held Plavix for 5 days to do Upper endoscopy and colonoscopy. Patient had upper endoscopy, mild Schatzki ring which was dilated. Colonoscopy has 10 diminutive polyps in sigmoid colon  which were resected. GI cleared the patient for discharge with outpatient follow up.      Normocytic anemia:  Patient Hb 9.7 fluctuating around it. Iron studies this admission look like anemia of chronic disease. Chart review clarifies further that he has a history of multifactorial anemia 2/2 iron deficiency and vit B12 deficiency. Vitamin B12 levels are low this admission, we have started supplements. Further work up with EGD/Colonoscopy .May need to consider IV vitamin b12 if there is concern for malabsorption. Received Feraheme 510 mg IV once.

## 2020-06-06 NOTE — ED Notes (Addendum)
Patient transported to Ultrasound 

## 2020-06-06 NOTE — ED Notes (Signed)
Made pt aware that we needed a urine specimen

## 2020-06-06 NOTE — ED Notes (Signed)
PT desat to 80's while sleeping 2 L [place]. 02 now 95

## 2020-06-06 NOTE — ED Notes (Signed)
Patient denies pain and is resting comfortably.  

## 2020-06-06 NOTE — ED Notes (Signed)
Pt returned to floor

## 2020-06-06 NOTE — ED Notes (Signed)
Patient resting in bed, no issues noted at this time.   

## 2020-06-06 NOTE — H&P (Signed)
Date: 06/06/2020               Patient Name:  Jason [name] MRN: [id number]  DOB: 1951-02-12 Age / Sex: 69 y.o., male   PCP: Horald Pollen, MD         Medical Service: Internal Medicine Teaching Service         Attending Physician: Dr. Velna Ochs, MD    First Contact: Dr. Demaris Callander Pager: [phone]  Second Contact: Dr. Charleen Kirks Pager: [phone]       After Hours (After 5p/  First Contact Pager: [phone]  weekends / holidays): Second Contact Pager: [phone]   Chief Complaint: Nausea, vomiting  History of Present Illness: This is a 69 year old male with a history of anxiety, PAD CAD s/p CABG 2011, right 1st and 2nd toe amputation, right femoropopliteral bypass s/p balloon angioplast, hypothyroidism, diabetes mellitus, hypertension, and hyperlipidemia who presented with 1 month history of a early sensation of fullness, possible food impaction , and nausea and vomiting.  Reports that he has been having alternating between loose stools and constipation for years. Over the past 1 month he has had worsening acid reflux symptoms.  He states that he will have some substernal chest pain following ingestion of food, feels like food gets stuck in the lower part of his chest, not related to exertion however does appear to improve with rest.  Does endorse some shortness of breath when he has the symptoms which improves after vomiting.  Reports that he tried multiple over-the-counter medications with no relief. He denies any fevers, chills, abdominal pain, hematemesis, melena, dysuria, hematuria, difficulty emptying his bladder, or changes in diet.   Patient reports that he is not on any medications for his acid reflux, states that he stopped taking Protonix and sertraline months ago.  Also endorses that he only occasionally will take Synthroid.  States that he does not have a PCP.  In the ED he was noted to be afebrile, mild tachypnea, and hypertension.  Noted to have an elevated  creatinine of 2.92 up from her last check of 1.29 in June 2021, unclear baseline, and potassium of 3.2. WBC was elevated at 19.  Troponins were flat. Abdominal x-ray showed abdominal and pelvic atherosclerosis and left external iliac artery stent.  Patient was given Reglan, Maalox, lidocaine, and hyoscyamine.  As well as 2 L NS. On follow up, patient started having loose stools, and on repeat labs Cr remained elevated at 2.79, with improvement in WBC to 15. Patient admitted to [facility] for further evaluation.  Meds:  Current Meds  Medication Sig  . acetaminophen (TYLENOL) 325 MG tablet Take 650 mg by mouth every 6 (six) hours as needed for mild pain or headache.   Marland Kitchen acetaminophen (TYLENOL) 650 MG CR tablet Take 1,950-2,600 mg by mouth 2 times daily at 12 noon and 4 pm.  . aspirin 81 MG tablet Take 1 tablet (81 mg total) by mouth daily.  . clopidogrel (PLAVIX) 75 MG tablet TAKE 1 TABLET BY MOUTH EVERY DAY (Patient taking differently: Take 75 mg by mouth daily.)  . diphenhydrAMINE (BENADRYL) 25 MG tablet Take 75 mg by mouth at bedtime as needed for sleep.  Marland Kitchen docusate sodium (COLACE) 100 MG capsule Take 100 mg by mouth 2 (two) times daily.  . ferrous sulfate 325 (65 FE) MG tablet Take 1 tablet (325 mg total) by mouth daily with breakfast.  . gabapentin (NEURONTIN) 300 MG capsule Take 1 capsule (300 mg total) by  mouth 2 (two) times daily.  Marland Kitchen ibuprofen (ADVIL) 200 MG tablet Take 800 mg by mouth in the morning and at bedtime.  . isosorbide mononitrate (IMDUR) 120 MG 24 hr tablet Take 1 tablet (120 mg total) by mouth daily.  Marland Kitchen levothyroxine (SYNTHROID) 50 MCG tablet Take 1 tablet (50 mcg total) by mouth daily.  Marland Kitchen loratadine (CLARITIN) 10 MG tablet Take 10 mg by mouth daily.  . metoprolol tartrate (LOPRESSOR) 25 MG tablet Take 0.5 tablets (12.5 mg total) by mouth 2 (two) times daily.  . nitroGLYCERIN (NITROSTAT) 0.4 MG SL tablet PLACE 1 TABLET (0.4 MG TOTAL) UNDER THE TONGUE EVERY 5 (FIVE) MINUTES times 3 then  call MD (Patient taking differently: Place 0.4 mg under the tongue every 5 (five) minutes as needed for chest pain.)  . ranolazine (RANEXA) 1000 MG SR tablet TAKE 1 TABLET BY MOUTH 2 TIMES DAILY. (Patient taking differently: Take 1,000 mg by mouth 2 (two) times daily.)  . rosuvastatin (CRESTOR) 20 MG tablet Take 1 tablet (20 mg total) by mouth daily.  . sertraline (ZOLOFT) 100 MG tablet Take 2 and 1/2 tablets by mouth daily (Patient taking differently: Take 250 mg by mouth daily.)  . sitaGLIPtin-metformin (JANUMET) 50-500 MG tablet TAKE 1 TABLET BY MOUTH TWICE A DAY WITH A MEAL. (Patient taking differently: Take 1 tablet by mouth 2 (two) times daily with a meal.)  . vitamin B-12 1000 MCG tablet Take 1 tablet (1,000 mcg total) by mouth daily.     Allergies: Allergies as of 06/05/2020 - Review Complete 12/17/2019  Allergen Reaction Noted  . Coreg [carvedilol] Nausea And Vomiting and Other (See Comments) 07/26/2013  . Sulfa antibiotics Nausea And Vomiting and Other (See Comments) 10/02/2011   Past Medical History:  Diagnosis Date  . Anxiety    off xanax  and paxil since 3/13  . Arthritis   . Bilateral carotid artery disease ([place])    s/p R ICA stent 03/28/2015 with distal protection. Known chronically occluded L ICA. Carotid stent complicated by hypotension and acute stroke [place]  . Cancer ([place])    tumor basal cell rem from lft arm  . Cataract   . COPD (chronic obstructive pulmonary disease) ([place])   . Coronary artery disease    a. s/p CABG in 2011 with LIMA-LAD, SVG-RI/OM, and SVG-PDA b. occluded SVG-PDA by cath in 2015 c. 10/2016: NSTEMI with cath showing thrombus along the distal graft to insertion of SVG-OM2 with DES placed.   . CVA (cerebral infarction)    occured on 10/10 several days after R ICA carotid stenting  . Depression   . Diabetes mellitus   . GERD (gastroesophageal reflux disease)   . Heart attack ([place])   . High cholesterol   . Hypertension    type 2  NIDDM  . Myocardial infarct ([facility])    x4 last 10 yrs  . Pneumonia    hx  . 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 Smoker   . Substance abuse ([place])     Family History:   Family History  Problem Relation Age of Onset  . Arthritis Mother   . Heart disease Father      Social History: Reports smoking 1 pack/day for the past 52 years.  Has history of crack cocaine use years ago, has not had anything recently.  Never injected.  Denies alcohol use.  Currently retired on disability.  Previously worked as a Building control surveyor.  Lives alone.  Brother and  daughter live in the area.  Review of Systems: A complete ROS was negative except as per HPI.   Physical Exam: Blood pressure 128/62, pulse 83, temperature 97.9 F (36.6 C), temperature source Oral, resp. rate 15, height 5\' 8"  (1.727 m), weight 72.6 kg, SpO2 99 %. Physical Exam Constitutional:      Appearance: Normal appearance.  HENT:     Head: Normocephalic and atraumatic.     Nose: Nose normal.     Mouth/Throat:     Mouth: Mucous membranes are moist.     Pharynx: Oropharynx is clear.  Cardiovascular:     Rate and Rhythm: Normal rate and regular rhythm.     Pulses: Normal pulses.     Heart sounds: Normal heart sounds.  Pulmonary:     Effort: Pulmonary effort is normal. No respiratory distress.     Breath sounds: Normal breath sounds.  Abdominal:     General: Abdomen is flat. Bowel sounds are normal.     Palpations: Abdomen is soft.     Tenderness: There is no abdominal tenderness.  Musculoskeletal:     Cervical back: Normal range of motion and neck supple.     Comments: Right toe amputated. No LE swelling  Skin:    General: Skin is warm and dry.     Capillary Refill: Capillary refill takes less than 2 seconds.  Neurological:     General: No focal deficit present.     Mental Status: He is alert and oriented to person, place, and time.  Psychiatric:        Mood and Affect: Mood normal.         Behavior: Behavior normal.      EKG: personally reviewed my interpretation is sinus rhythm, RBBB, bifascicular block  ABd x-ray:  IMPRESSION: 1. Normal bowel gas pattern, no free air. 2. No acute cardiopulmonary abnormality. Prior CABG. Stable chronic probable smoking related interstitial markings. 3. Evidence of abdominal and pelvic atherosclerosis. Left external iliac artery vascular stent. 4. Prior lumbar decompression and fusion with adjacent segment disease.  Assessment & Plan by Problem: Active Problems:   AKI (acute kidney injury) [facility])  This is a 69 year old male with a history of anxiety, PAD, CAD s/p CABG 2542 combined systolic and diastolic heart failure, hypothyroidism, diabetes mellitus, pretension, and hyperlipidemia who presented with 1 month history of a early sensation of fullness, possible food impaction , and nausea and vomiting which improved with GI cocktail, noted to have an AKI.   AKI versus AKI on CKD: Patient has a history of uncontrolled diabetes and uncontrolled hypertension, will intermittently take his medications.  Cr of 2.92 on admission, improved mildly to 2.79 with fluids. Creatinine is unclear, seem to be around 1.1 with a small AKI in June 2021.  Patient has been having approximately 1 month history of nausea and emesis and decreased p.o. intake, also endorsed loose stools.  He also endorses a daily use of ibuprofen about 4-5 tabs twice a day for multiple years for his chronic back pain.  Does endorse some mild slowing of his urine however denies any straining, difficulty emptying, dysuria, hematuria, or abdominal pain.  Urinalysis done showed 30 protein, negative for nitrates, hemoglobin, ketones, or leukocytes.  Patient does appear volume down on exam, this could be prerenal secondary to decreased p.o. intake however this also could be progression due to to his ibuprofen use, uncontrolled hypertension, and diabetes.  -We will bolus 1 L normal saline,  continue maintenance fluids -Repeat BMP  in a.m. -Obtain protein/creatinine ratio -Obtain Fena urine studies -Consider renal ultrasound if not improvement in AM  Nausea/vomiting, early satiety: Reports a 1 month history of dysphagia, early satiety, worsening reflux, nausea, and emesis.  Has never had this before.  Denies any recent travel or sick contacts. No changes in his diet.  Denies any hematemesis, hematochezia, or melena.  Weight appears to be stable with no significant weight loss.  Patient has mild electrolyte abnormalities with hypokalemia 3.2.  Symptoms improved with GI cocktail.  This could be secondary to untreated GERD, gastroparesis, or mild gastroenteritis.  -Resume home pantoprazole (patient was not taking) -Zofran PRN -Consider esophagram if dysphagia continues  HTN:  Patient is on metoprolol 12.5 mg twice daily.  Blood pressures have been well controlled and occasionally will be soft.   -Resumed home metoprolol  Normocytic anemia:  Hemoglobin on admission 12.3, decreased to 10.3 today however all cell lines decreased so may have some component of dilution. Denies any sources of bleeding.  Has history of iron deficiency/vitamin B12 deficiency/anemia of chronic disease.  Received Feraheme on his last visit.  -Daily CBC  DM:  Last A1c was 7.3. Is on Janumet at home which he reports he takes.  -Repeat A1c today -SSI while admitted  Hypothyroidism: Patient is prescribed 50 mcg tablets of levothyroxine daily at home however states that he only intermittently will take this.  States that he does not have a PCP. -Check TSH -Resume levothyroxine 50 mcg daily -Referral for social work to assist with PCP  Anxiety:  Patient has been prescribed sertraline 250 mg daily in the past, states that he ran out more than 6 months ago.  States that he has been feeling depressed and anxious and would be interested in resuming this medication. -Resume home sertraline at 100 mg  daily -Referral for social work  HLD: Patient reports taking Crestor 20 mg daily, will resume this.  CAD status post CABG: Has been reporting acid reflux symptoms with some substernal chest pain that is not exertional related.  Troponins have been flat and EKG is unchanged from prior.  PAD: Patient has history of PAD s/p R fem-pop bypass. On ASA and plavix. Resumed this.  Tobacco use: Smokes 1 pack/day over the past 2 years.  Encourage smoking cessation.  Dispo: Admit patient to Observation with expected length of stay less than 2 midnights.  Signed: Asencion Noble, MD 06/06/2020, 1:59 PM  Pager: [phone] After 5pm on weekdays and 1pm on weekends: On Call pager: [phone]

## 2020-06-06 NOTE — ED Notes (Signed)
Daughter (cell)  [phone]

## 2020-06-06 NOTE — ED Notes (Signed)
PT transported to Xray

## 2020-06-07 ENCOUNTER — Observation Stay (HOSPITAL_COMMUNITY): Payer: Medicare Other

## 2020-06-07 DIAGNOSIS — I130 Hypertensive heart and chronic kidney disease with heart failure and stage 1 through stage 4 chronic kidney disease, or unspecified chronic kidney disease: Secondary | ICD-10-CM

## 2020-06-07 DIAGNOSIS — E860 Dehydration: Secondary | ICD-10-CM | POA: Diagnosis not present

## 2020-06-07 DIAGNOSIS — K529 Noninfective gastroenteritis and colitis, unspecified: Secondary | ICD-10-CM

## 2020-06-07 DIAGNOSIS — F419 Anxiety disorder, unspecified: Secondary | ICD-10-CM

## 2020-06-07 DIAGNOSIS — E785 Hyperlipidemia, unspecified: Secondary | ICD-10-CM

## 2020-06-07 DIAGNOSIS — R112 Nausea with vomiting, unspecified: Secondary | ICD-10-CM | POA: Diagnosis not present

## 2020-06-07 DIAGNOSIS — I739 Peripheral vascular disease, unspecified: Secondary | ICD-10-CM

## 2020-06-07 DIAGNOSIS — I2510 Atherosclerotic heart disease of native coronary artery without angina pectoris: Secondary | ICD-10-CM

## 2020-06-07 DIAGNOSIS — R1110 Vomiting, unspecified: Secondary | ICD-10-CM

## 2020-06-07 DIAGNOSIS — I5042 Chronic combined systolic (congestive) and diastolic (congestive) heart failure: Secondary | ICD-10-CM

## 2020-06-07 DIAGNOSIS — N179 Acute kidney failure, unspecified: Secondary | ICD-10-CM | POA: Diagnosis not present

## 2020-06-07 DIAGNOSIS — R1310 Dysphagia, unspecified: Secondary | ICD-10-CM

## 2020-06-07 DIAGNOSIS — E039 Hypothyroidism, unspecified: Secondary | ICD-10-CM

## 2020-06-07 DIAGNOSIS — E43 Unspecified severe protein-calorie malnutrition: Secondary | ICD-10-CM

## 2020-06-07 DIAGNOSIS — R197 Diarrhea, unspecified: Secondary | ICD-10-CM

## 2020-06-07 DIAGNOSIS — Z951 Presence of aortocoronary bypass graft: Secondary | ICD-10-CM

## 2020-06-07 DIAGNOSIS — N189 Chronic kidney disease, unspecified: Secondary | ICD-10-CM

## 2020-06-07 DIAGNOSIS — R634 Abnormal weight loss: Secondary | ICD-10-CM | POA: Diagnosis not present

## 2020-06-07 DIAGNOSIS — D6489 Other specified anemias: Secondary | ICD-10-CM

## 2020-06-07 DIAGNOSIS — E1122 Type 2 diabetes mellitus with diabetic chronic kidney disease: Secondary | ICD-10-CM

## 2020-06-07 DIAGNOSIS — Z89429 Acquired absence of other toe(s), unspecified side: Secondary | ICD-10-CM

## 2020-06-07 DIAGNOSIS — D72829 Elevated white blood cell count, unspecified: Secondary | ICD-10-CM

## 2020-06-07 LAB — CBC
HCT: 30.7 % — ABNORMAL LOW (ref 39.0–52.0)
Hemoglobin: 10.2 g/dL — ABNORMAL LOW (ref 13.0–17.0)
MCH: 30.5 pg (ref 26.0–34.0)
MCHC: 33.2 g/dL (ref 30.0–36.0)
MCV: 91.9 fL (ref 80.0–100.0)
Platelets: 318 10*3/uL (ref 150–400)
RBC: 3.34 MIL/uL — ABNORMAL LOW (ref 4.22–5.81)
RDW: 14.6 % (ref 11.5–15.5)
WBC: 12.1 10*3/uL — ABNORMAL HIGH (ref 4.0–10.5)
nRBC: 0 % (ref 0.0–0.2)

## 2020-06-07 LAB — CBG MONITORING, ED: Glucose-Capillary: 113 mg/dL — ABNORMAL HIGH (ref 70–99)

## 2020-06-07 LAB — PROTEIN / CREATININE RATIO, URINE
Creatinine, Urine: 209.25 mg/dL
Protein Creatinine Ratio: 0.29 mg/mg{Cre} — ABNORMAL HIGH (ref 0.00–0.15)
Total Protein, Urine: 60 mg/dL

## 2020-06-07 LAB — RAPID URINE DRUG SCREEN, HOSP PERFORMED
Amphetamines: NOT DETECTED
Barbiturates: NOT DETECTED
Benzodiazepines: NOT DETECTED
Cocaine: NOT DETECTED
Opiates: NOT DETECTED
Tetrahydrocannabinol: NOT DETECTED

## 2020-06-07 LAB — COMPREHENSIVE METABOLIC PANEL
ALT: 9 U/L (ref 0–44)
AST: 13 U/L — ABNORMAL LOW (ref 15–41)
Albumin: 2.9 g/dL — ABNORMAL LOW (ref 3.5–5.0)
Alkaline Phosphatase: 118 U/L (ref 38–126)
Anion gap: 10 (ref 5–15)
BUN: 23 mg/dL (ref 8–23)
CO2: 27 mmol/L (ref 22–32)
Calcium: 8.9 mg/dL (ref 8.9–10.3)
Chloride: 102 mmol/L (ref 98–111)
Creatinine, Ser: 1.63 mg/dL — ABNORMAL HIGH (ref 0.61–1.24)
GFR, Estimated: 46 mL/min — ABNORMAL LOW (ref >60)
Glucose, Bld: 155 mg/dL — ABNORMAL HIGH (ref 70–99)
Potassium: 3.9 mmol/L (ref 3.5–5.1)
Sodium: 139 mmol/L (ref 135–145)
Total Bilirubin: 0.6 mg/dL (ref 0.3–1.2)
Total Protein: 5.9 g/dL — ABNORMAL LOW (ref 6.5–8.1)

## 2020-06-07 LAB — GLUCOSE, CAPILLARY
Glucose-Capillary: 139 mg/dL — ABNORMAL HIGH (ref 70–99)
Glucose-Capillary: 118 mg/dL — ABNORMAL HIGH (ref 70–99)

## 2020-06-07 LAB — SODIUM, URINE, RANDOM: Sodium, Ur: 85 mmol/L

## 2020-06-07 LAB — TSH: TSH: 1.455 u[IU]/mL (ref 0.350–4.500)

## 2020-06-07 LAB — SAVE SMEAR(SSMR), FOR PROVIDER SLIDE REVIEW

## 2020-06-07 LAB — CREATININE, URINE, RANDOM: Creatinine, Urine: 203.74 mg/dL

## 2020-06-07 MED ORDER — NICOTINE 21 MG/24HR TD PT24
21.0000 mg | MEDICATED_PATCH | Freq: Every day | TRANSDERMAL | Status: DC
  Administered 2020-06-07 – 2020-06-14 (×8): 21 mg via TRANSDERMAL
  Filled 2020-06-07 (×8): qty 1

## 2020-06-07 MED ORDER — INSULIN ASPART 100 UNIT/ML ~~LOC~~ SOLN
0.0000 [IU] | Freq: Three times a day (TID) | SUBCUTANEOUS | Status: DC
  Administered 2020-06-07: 17:00:00 0 [IU] via SUBCUTANEOUS
  Administered 2020-06-10: 12:00:00 2 [IU] via SUBCUTANEOUS
  Administered 2020-06-12 – 2020-06-14 (×2): 1 [IU] via SUBCUTANEOUS

## 2020-06-07 NOTE — Progress Notes (Signed)
Subjective: HD#1 No acute overnight events. Jason [name] evaluated at bedside this AM. He notes stomach pains, diarrhea. States he is doing better but still not at baseline. Endorses 2.5 months of diarrhea with vomiting, emesis even with liquids. Notes very painful acid reflux and epigastric abdominal pain. Endorses low and high temperatures at home. High around 99. + chills, ? Night sweats . Endorses feeling "faint" at times   Objective:  Vital signs in last 24 hours: Vitals:   06/07/20 0612 06/07/20 0946 06/07/20 1115 06/07/20 1427  BP: 113/61 (!) 121/56 117/61 (!) 117/51  Pulse: 82 78 80 72  Resp: 16  (!) 24 20  Temp: 98.6 F (37 C)   98.2 F (36.8 C)  TempSrc: Oral   Oral  SpO2: 97%  96% 97%  Weight:      Height:       CBC Latest Ref Rng & Units 06/07/2020 06/06/2020 06/05/2020  WBC 4.0 - 10.5 K/uL 12.1(H) 15.0(H) 19.0(H)  Hemoglobin 13.0 - 17.0 g/dL 10.2(L) 10.3(L) 12.3(L)  Hematocrit 39.0 - 52.0 % 30.7(L) 33.2(L) 39.1  Platelets 150 - 400 K/uL 318 336 428(H)   Physical exam: General: Well developed, elderly male laying comfortably in bed, NAD HEENT: MMM Cv: RRR, S1, S2 heard, No m/r/g Pulmonary: CTAB Abd: Soft, no rigidity, no guarding, no tenderness on palpation MSK: R 2 toes amputated, callus on R foot  Neuro: AAO*3 Psych: Normal mood and affect Assessment/Plan: 69 year old male with a history of anxiety, PAD, CAD s/p CABG 9562 combined systolic and diastolic heart failure, hypothyroidism, diabetes mellitus, pretension, and hyperlipidemia who presented with 1 month history of a early sensation of fullness, possible food impaction , and nausea and vomiting which improved with GI cocktail, noted to have an AKI.   Active Problems:   AKI (acute kidney injury) ([place])  AKI versus AKI on CKD: Improving SCr~ 1.63, trending down from 2.79 on admission.  BUN is 23 from 28. Protein /creatinine ratio is 0.29, mildly elevated. Renal ultrasound does not show any acute  abnormality.  -Encourage oral intake -F/U BMP -Obtain Fena urine studies   Nausea/vomiting, early satiety: Reports a 1 month history of dysphagia, early satiety, worsening reflux, nausea, and emesis.  Weight appears to be stable with no significant weight loss but complains of low grade fever, chills and possible night sweats. Potassium is 3.9 at this morning.  Symptoms improved with GI cocktail.  GI consulted due to severity and chronicity of his ongoing symptoms and will appreciate their recommendations.   -C/W Protonix -Zofran PRN -F/U Fecal studies   HTN:  Patient is on metoprolol 12.5 mg twice daily.  Blood pressures have been well controlled and occasionally will be soft.   -C/W metoprolol  Normocytic anemia:  Hemoglobin on admission 12.3, decreased to 10.3 today however all cell lines decreased so may have some component of dilution. Denies any sources of bleeding.  Has history of iron deficiency/vitamin B12 deficiency/anemia of chronic disease.  Received Feraheme on his last visit.  -Daily CBC  DM:  Latest A1c is 6.5.   -SSI   Hypothyroidism: Current TSH is 1.455. Patient is prescribed 50 mcg tablets of levothyroxine daily at home however states that he only intermittently will take this.  States that he does not have a PCP.  -Resume levothyroxine 50 mcg daily -Referral for social work to assist with PCP  Anxiety:  Patient has been prescribed sertraline 250 mg daily in the past, states that he ran out more than  6 months ago.  States that he has been feeling depressed and anxious and would be interested in resuming this medication. -Resume home sertraline at 100 mg daily -Referral for social work  HLD: Patient reports taking Crestor 20 mg daily, will resume this.   PAD: Patient has history of PAD s/p R fem-pop bypass. On ASA and plavix. Resumed this. Also notes callus under his R foot. - F/U X-ray R foot   Prior to Admission Living Arrangement:  Home Anticipated Discharge Location: Home Barriers to Discharge: Ongoing medical management Dispo: Anticipated discharge in approximately 2-3 day(s).   Jason Junes, MD 06/07/2020, 5:02 PM Pager: [phone] After 5pm on weekdays and 1pm on weekends: On Call pager [phone]

## 2020-06-07 NOTE — ED Notes (Addendum)
Pt had stool on pants. Pt was able to ambulate to bathroom with standby assistance. Tech helped pt clean up and took of dirty pants. Brief was placed on pt and fall management slipper socks placed as well. Pt is back in bed at this time, bed is in the lowest position and call bell is within reach.

## 2020-06-07 NOTE — Progress Notes (Signed)
Physical Therapy Evaluation Patient Details Name: Jason [name] MRN: [id number] DOB: 1950-07-18 Today's Date: 06/07/2020   History of Present Illness  Pt is a 69 y/o male admitted secondary to AKI and nausea/vomiting. PMH includes CAD s/p CABG, tobacco use, COPD, DM, and HTN.  Clinical Impression  Pt admitted secondary to problem above with deficits below. Pt requiring min guard A for mobility within the room. Limited secondary to nausea and back pain. Pt reporting callous on R foot that is causing increased pain; notified MD that was in the room at end of session. Feel pt would benefit from rollator given limited mobility tolerance and outpatient PT to address back pain and mobility deficits. Will continue to follow acutely to maximize functional mobility independence and safety.     Follow Up Recommendations Outpatient PT    Equipment Recommendations  Other (comment) (4 wheeled walker with a seat (rollator))    Recommendations for Other Services       Precautions / Restrictions Precautions Precautions: None Restrictions Weight Bearing Restrictions: No      Mobility  Bed Mobility Overal bed mobility: Needs Assistance Bed Mobility: Supine to Sit;Sit to Supine     Supine to sit: Supervision Sit to supine: Supervision   General bed mobility comments: Supervision for safety.    Transfers Overall transfer level: Needs assistance Equipment used: None Transfers: Sit to/from Stand Sit to Stand: Min guard         General transfer comment: Min guard for safety.  Ambulation/Gait Ambulation/Gait assistance: Min guard Gait Distance (Feet): 40 Feet Assistive device: None Gait Pattern/deviations: Step-through pattern Gait velocity: Decreased   General Gait Details: No LOB noted, however, pt reporting back pain and R foot pain.  Stairs            Wheelchair Mobility    Modified Rankin (Stroke Patients Only)       Balance Overall balance assessment: Needs  assistance Sitting-balance support: No upper extremity supported;Feet supported Sitting balance-Leahy Scale: Good     Standing balance support: No upper extremity supported;During functional activity Standing balance-Leahy Scale: Fair                               Pertinent Vitals/Pain Pain Assessment: Faces Faces Pain Scale: Hurts little more Pain Location: back Pain Descriptors / Indicators: Aching;Operative site guarding Pain Intervention(s): Limited activity within patient's tolerance;Monitored during session;Repositioned    Home Living Family/patient expects to be discharged to:: Private residence Living Arrangements: Alone Available Help at Discharge: Family;Available PRN/intermittently (brother can assist) Type of Home: House Home Access: Stairs to enter Entrance Stairs-Rails: Psychiatric nurse of Steps: 7 Home Layout: One level Home Equipment: Environmental consultant - 2 wheels      Prior Function Level of Independence: Independent with assistive device(s)         Comments: Occasionally uses a RW when back pain flares     Hand Dominance        Extremity/Trunk Assessment   Upper Extremity Assessment Upper Extremity Assessment: Defer to OT evaluation    Lower Extremity Assessment Lower Extremity Assessment: RLE deficits/detail RLE Deficits / Details: Multiple toes amputated. Pt with callous on foot that he reports hurts when ambulating    Cervical / Trunk Assessment Cervical / Trunk Assessment: Normal  Communication   Communication: No difficulties  Cognition Arousal/Alertness: Awake/alert Behavior During Therapy: WFL for tasks assessed/performed Overall Cognitive Status: Within Functional Limits for tasks assessed  General Comments      Exercises     Assessment/Plan    PT Assessment Patient needs continued PT services  PT Problem List Decreased mobility;Decreased activity  tolerance;Pain;Decreased strength       PT Treatment Interventions Gait training;Stair training;Functional mobility training;Therapeutic activities;Therapeutic exercise;Balance training;Patient/family education    PT Goals (Current goals can be found in the Care Plan section)  Acute Rehab PT Goals Patient Stated Goal: to go home PT Goal Formulation: With patient Time For Goal Achievement: 06/21/20 Potential to Achieve Goals: Good    Frequency Min 3X/week   Barriers to discharge        Co-evaluation               AM-PAC PT "6 Clicks" Mobility  Outcome Measure Help needed turning from your back to your side while in a flat bed without using bedrails?: None Help needed moving from lying on your back to sitting on the side of a flat bed without using bedrails?: None Help needed moving to and from a bed to a chair (including a wheelchair)?: A Little Help needed standing up from a chair using your arms (e.g., wheelchair or bedside chair)?: A Little Help needed to walk in hospital room?: A Little Help needed climbing 3-5 steps with a railing? : A Little 6 Click Score: 20    End of Session Equipment Utilized During Treatment: Gait belt Activity Tolerance: Patient limited by pain Patient left: in bed;with call bell/phone within reach (on stretcher in ED with MD present) Nurse Communication: Mobility status PT Visit Diagnosis: Other abnormalities of gait and mobility (R26.89);Pain Pain - part of body:  (back)    Time: 2482-5003 PT Time Calculation (min) (ACUTE ONLY): 12 min   Charges:   PT Evaluation $PT Eval Low Complexity: 1 Low          Lou Miner, DPT  Acute Rehabilitation Services  Pager: [phone] Office: [phone]   Rudean Hitt 06/07/2020, 1:21 PM

## 2020-06-07 NOTE — ED Notes (Signed)
Lunch Tray Ordered @ 1032. 

## 2020-06-07 NOTE — Consult Note (Addendum)
Consultation  Referring Provider: Dr.Guilloud     Primary Care Physician:  Horald Pollen, MD Primary Gastroenterologist: Althia Forts       Reason for Consultation: GERD, nausea, vomiting, dysphagia , abdominal pain         HPI:   Jason [name] is a 69 y.o. male with a past medical history of anxiety, PAD, CAD status post CABG 2011 on Plavix, diabetes and others listed below, who initially presented to the ER on 06/06/2020 with a complaint of 1-2 month history of early sensation of fullness, possible food impaction and nausea and vomiting nominal pain.    Today, the patient explains that he has been feeling full very fast over the past month to 2 months explaining that he will eat just a little bit of food and feel like it either gets stuck on its way down and causes some chest pain or that he just is not hungry.  Along with this has been experiencing a lot of nausea and vomiting which is brought on with eating.  Has hardly been able to keep anything down at all.  After vomiting he has excruciating abdominal pain typically in his epigastrium which also radiates through to his back, if he does not eat some of the symptoms are slightly better.  Along with this explains that anytime he got anything down he would have explosive diarrhea.  Denies seeing any blood in his stools.  Associated symptoms include reflux and a taste of acid in his mouth.  He tried over-the-counter Zantac with no relief.  Also describes seeing occasional "very dark" stools.  Does describe using for Ibuprofen 4 tabs twice a day for at least the past 10 years for arthritic back pain.    Denies previous GI evaluation with EGD or colonoscopy.    Denies fever or chills.  ED course: Mild tachypnea, hypertension, creatinine of 2.92 (6/21 1.29), potassium 3.2, WBC elevated at 19, abdominal x-ray showed abdominal and pelvic atherosclerosis and left external iliac artery stent he was given Reglan, Maalox, lidocaine  hyoscyamine.  Past Medical History:  Diagnosis Date  . Anxiety    off xanax  and paxil since 3/13  . Arthritis   . Bilateral carotid artery disease ([place])    s/p R ICA stent 03/28/2015 with distal protection. Known chronically occluded L ICA. Carotid stent complicated by hypotension and acute stroke in [place]  . Cancer ([place])    tumor basal cell rem from lft arm  . Cataract   . COPD (chronic obstructive pulmonary disease) ([place])   . Coronary artery disease    a. s/p CABG in 2011 with LIMA-LAD, SVG-RI/OM, and SVG-PDA b. occluded SVG-PDA by cath in 2015 c. 10/2016: NSTEMI with cath showing thrombus along the distal graft to insertion of SVG-OM2 with DES placed.   . CVA (cerebral infarction)    occured on 10/10 several days after R ICA carotid stenting  . Depression   . Diabetes mellitus   . GERD (gastroesophageal reflux disease)   . Heart attack ([place])   . High cholesterol   . Hypertension    type 2 NIDDM  . Myocardial infarct (H[facility])    x4 last 10 yrs  . Pneumonia    hx  . 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 Smoker   . Substance abuse [facility])     Past Surgical History:  Procedure Laterality Date  . ABDOMINAL AORTOGRAM W/LOWER EXTREMITY Bilateral 05/31/2019   Procedure:  ABDOMINAL AORTOGRAM W/LOWER EXTREMITY;  Surgeon: Waynetta Sandy, MD;  Location: [place] [facility];  Service: Cardiovascular;  Laterality: Bilateral;  . AMPUTATION TOE Right 06/02/2019   Procedure: Amputation Right Great Toe and Second Toe;  Surgeon: Waynetta Sandy, MD;  Location: [place];  Service: Vascular;  Laterality: Right;  . BACK SURGERY  Jan 2014, Dec 2011   Dr Vertell Limber  . BYPASS GRAFT FEMORAL-PERONEAL Left 11/29/2019   Procedure: Left Bypass Graft Femoral-Peroneal using non reversed Greater Sapphenous vein;  Surgeon: Waynetta Sandy, MD;  Location: [place];  Service: Vascular;  Laterality: Left;  . CARDIAC CATHETERIZATION  4/12   Medical  Rx  . CORONARY ANGIOGRAM  01/17/14   med rx  . CORONARY ANGIOGRAM  4/13   med Rx  . CORONARY ANGIOGRAM  1/15   Med Rx  . CORONARY ANGIOPLASTY  Jan 2004   RCA  . CORONARY ARTERY BYPASS GRAFT  07/24/2009   L-LAD, SVG-RI/OM, SVG-PDA  . CORONARY STENT INTERVENTION N/A 10/23/2016   Procedure: Coronary Stent Intervention;  Surgeon: Peter M Martinique, MD;  Location: [place] [facility];  Service: Cardiovascular;  Laterality: N/A;  . ENDARTERECTOMY FEMORAL Right 06/02/2019   Procedure: Endarterectomy External Iliac  Femoral Artery and Profunda;  Surgeon: Waynetta Sandy, MD;  Location: [place];  Service: Vascular;  Laterality: Right;  . EYE SURGERY     cat bil  . FEMORAL ARTERY - FEMORAL ARTERY BYPASS GRAFT Right 2004   femoral enarterectomy  . FEMORAL-POPLITEAL BYPASS GRAFT Right 06/02/2019   Procedure: RIGHT LEG BYPASS GRAFT FEMORAL-POPLITEAL ARTERY using Gore Propaten Vascular Graft Removable Ring;  Surgeon: Waynetta Sandy, MD;  Location: [place];  Service: Vascular;  Laterality: Right;  . LEFT HEART CATH AND CORS/GRAFTS ANGIOGRAPHY N/A 10/23/2016   Procedure: Left Heart Cath and Cors/Grafts Angiography;  Surgeon: Peter M Martinique, MD;  Location: [place] [facility];  Service: Cardiovascular;  Laterality: N/A;  . LEFT HEART CATHETERIZATION WITH CORONARY ANGIOGRAM N/A 10/03/2011   Procedure: LEFT HEART CATHETERIZATION WITH CORONARY ANGIOGRAM;  Surgeon: Pixie Casino, MD;  Location: [facility] CATH LAB;  Service: Cardiovascular;  Laterality: N/A;  . LEFT HEART CATHETERIZATION WITH CORONARY ANGIOGRAM N/A 07/08/2013   Procedure: LEFT HEART CATHETERIZATION WITH CORONARY ANGIOGRAM;  Surgeon: Blane Ohara, MD;  Location[facility] CATH LAB;  Service: Cardiovascular;  Laterality: N/A;  . LEFT HEART CATHETERIZATION WITH CORONARY/GRAFT ANGIOGRAM N/A 01/17/2014   Procedure: LEFT HEART CATHETERIZATION WITH Beatrix Fetters;  Surgeon: Troy Sine, MD;  Location: [facility] CATH LAB;  Service: Cardiovascular;   Laterality: N/A;  . LOWER EXTREMITY ANGIOGRAPHY N/A 11/28/2019   Procedure: LOWER EXTREMITY ANGIOGRAPHY;  Surgeon: Waynetta Sandy, MD;  Location: [place] [facility];  Service: Cardiovascular;  Laterality: N/A;  . PATCH ANGIOPLASTY Right 06/02/2019   Procedure: Patch Angioplasty using Hemashield Platinum Finesse Patch of the External Iliac Femoral Artery and Profunda;  Surgeon: Waynetta Sandy, MD;  Location: [place];  Service: Vascular;  Laterality: Right;  . PERIPHERAL VASCULAR BALLOON ANGIOPLASTY Right 11/28/2019   Procedure: PERIPHERAL VASCULAR BALLOON ANGIOPLASTY;  Surgeon: Waynetta Sandy, MD;  Location: [place] [facility];  Service: Cardiovascular;  Laterality: Right;  Common femoral  . PERIPHERAL VASCULAR CATHETERIZATION N/A 03/28/2015   Procedure: Carotid PTA/Stent Intervention;  Surgeon: Lorretta Harp, MD;  Location: [place] CV [facility];  Service: Cardiovascular;  Laterality: N/A;  . PERIPHERAL VASCULAR INTERVENTION Left 11/28/2019   Procedure: PERIPHERAL VASCULAR INTERVENTION;  Surgeon: Waynetta Sandy, MD;  Location:  [place] INVASIVE CV LAB;  Service: Cardiovascular;  Laterality: Left;  external iliac  . US EXTREMITY*L*     lft arm tumor removed     Family History  Problem Relation Age of Onset  . Arthritis Mother   . Heart disease Father     Social History   Tobacco Use  . Smoking status: Current Every Day Smoker    Packs/day: 1.00    Years: 48.00    Pack years: 48.00    Types: Cigarettes  . Smokeless tobacco: Never Used  Vaping Use  . Vaping Use: Never used  Substance Use Topics  . Alcohol use: Yes    Alcohol/week: 0.0 standard drinks    Comment: socially  . Drug use: Not Currently    Prior to Admission medications   Medication Sig Start Date End Date Taking? Authorizing Provider  acetaminophen (TYLENOL) 325 MG tablet Take 650 mg by mouth every 6 (six) hours as needed for mild pain or headache.    Yes [provider]   acetaminophen (TYLENOL) 650 MG CR tablet Take 1,950-2,600 mg by mouth 2 times daily at 12 noon and 4 pm.   Yes [provider]  aspirin 81 MG tablet Take 1 tablet (81 mg total) by mouth daily. 10/26/16  Yes Lyda Jester M, PA-C  clopidogrel (PLAVIX) 75 MG tablet TAKE 1 TABLET BY MOUTH EVERY DAY Patient taking differently: Take 75 mg by mouth daily. 02/13/20  Yes Troy Sine, MD  diphenhydrAMINE (BENADRYL) 25 MG tablet Take 75 mg by mouth at bedtime as needed for sleep.   Yes [provider]  docusate sodium (COLACE) 100 MG capsule Take 100 mg by mouth 2 (two) times daily.   Yes [provider]  ferrous sulfate 325 (65 FE) MG tablet Take 1 tablet (325 mg total) by mouth daily with breakfast. 12/08/19  Yes Hosie Poisson, MD  gabapentin (NEURONTIN) 300 MG capsule Take 1 capsule (300 mg total) by mouth 2 (two) times daily. 12/07/19  Yes Hosie Poisson, MD  ibuprofen (ADVIL) 200 MG tablet Take 800 mg by mouth in the morning and at bedtime.   Yes [provider]  isosorbide mononitrate (IMDUR) 120 MG 24 hr tablet Take 1 tablet (120 mg total) by mouth daily. 02/09/20  Yes Troy Sine, MD  levothyroxine (SYNTHROID) 50 MCG tablet Take 1 tablet (50 mcg total) by mouth daily. 02/09/20  Yes Troy Sine, MD  loratadine (CLARITIN) 10 MG tablet Take 10 mg by mouth daily.   Yes [provider]  metoprolol tartrate (LOPRESSOR) 25 MG tablet Take 0.5 tablets (12.5 mg total) by mouth 2 (two) times daily. 12/07/19  Yes Hosie Poisson, MD  nitroGLYCERIN (NITROSTAT) 0.4 MG SL tablet PLACE 1 TABLET (0.4 MG TOTAL) UNDER THE TONGUE EVERY 5 (FIVE) MINUTES times 3 then call MD Patient taking differently: Place 0.4 mg under the tongue every 5 (five) minutes as needed for chest pain. 08/29/19  Yes Kroeger, Daleen Snook M., PA-C  ranolazine (RANEXA) 1000 MG SR tablet TAKE 1 TABLET BY MOUTH 2 TIMES DAILY. Patient taking differently: Take 1,000 mg by mouth 2 (two) times daily. 01/25/20   Yes Troy Sine, MD  rosuvastatin (CRESTOR) 20 MG tablet Take 1 tablet (20 mg total) by mouth daily. 08/29/19  Yes Kroeger, Daleen Snook M., PA-C  sertraline (ZOLOFT) 100 MG tablet Take 2 and 1/2 tablets by mouth daily Patient taking differently: Take 250 mg by mouth daily. 07/04/19  Yes Hennie Duos, MD  sitaGLIPtin-metformin Carlyn Reichert)  50-500 MG tablet TAKE 1 TABLET BY MOUTH TWICE A DAY WITH A MEAL. Patient taking differently: Take 1 tablet by mouth 2 (two) times daily with a meal. 01/30/20  Yes Troy Sine, MD  vitamin B-12 1000 MCG tablet Take 1 tablet (1,000 mcg total) by mouth daily. 12/08/19  Yes Hosie Poisson, MD  metoCLOPramide (REGLAN) 10 MG tablet Take 1 tablet (10 mg total) by mouth every 6 (six) hours. 06/06/20   Fatima Blank, MD  pantoprazole (PROTONIX) 40 MG tablet Take 1 tablet (40 mg total) by mouth daily. Patient not taking: No sig reported 12/08/19   Hosie Poisson, MD    Current Facility-Administered Medications  Medication Dose Route Frequency Provider Last Rate Last Admin  . acetaminophen (TYLENOL) tablet 650 mg  650 mg Oral Q6H PRN Asencion Noble, MD      . aspirin EC tablet 81 mg  81 mg Oral Daily Asencion Noble, MD   81 mg at 06/07/20 0947  . clopidogrel (PLAVIX) tablet 75 mg  75 mg Oral Daily Asencion Noble, MD   75 mg at 06/07/20 0949  . diphenhydrAMINE (BENADRYL) capsule 50 mg  50 mg Oral QHS PRN Asencion Noble, MD      . docusate sodium (COLACE) capsule 100 mg  100 mg Oral BID Asencion Noble, MD   100 mg at 06/07/20 0949  . ferrous sulfate tablet 325 mg  325 mg Oral Q breakfast Asencion Noble, MD   325 mg at 06/07/20 0839  . gabapentin (NEURONTIN) capsule 300 mg  300 mg Oral BID Asencion Noble, MD   300 mg at 06/07/20 0949  . heparin injection 5,000 Units  5,000 Units Subcutaneous Q8H Asencion Noble, MD   5,000 Units at 06/07/20 1420  . insulin aspart (novoLOG) injection 0-9 Units  0-9 Units Subcutaneous TID WC Jose Persia, MD      . isosorbide mononitrate (IMDUR) 24 hr tablet 120 mg  120 mg Oral Daily Asencion Noble, MD   120 mg at 06/07/20 0950  . levothyroxine (SYNTHROID) tablet 50 mcg  50 mcg Oral Daily Asencion Noble, MD   50 mcg at 06/07/20 6761  . loratadine (CLARITIN) tablet 10 mg  10 mg Oral Daily Asencion Noble, MD   10 mg at 06/07/20 0947  . metoprolol tartrate (LOPRESSOR) tablet 12.5 mg  12.5 mg Oral BID Sherry Ruffing, Marissa M, MD   12.5 mg at 06/07/20 0946  . nicotine (NICODERM CQ - dosed in mg/24 hours) patch 21 mg  21 mg Transdermal Daily Asencion Noble, MD   21 mg at 06/07/20 0839  . ondansetron (ZOFRAN) tablet 4 mg  4 mg Oral Q6H PRN Asencion Noble, MD       Or  . ondansetron [facility]) injection 4 mg  4 mg Intravenous Q6H PRN Asencion Noble, MD   4 mg at 06/07/20 1243  . pantoprazole (PROTONIX) EC tablet 40 mg  40 mg Oral Daily Asencion Noble, MD   40 mg at 06/07/20 0948  . ranolazine (RANEXA) 12 hr tablet 1,000 mg  1,000 mg Oral BID Asencion Noble, MD   1,000 mg at 06/07/20 0944  . rosuvastatin (CRESTOR) tablet 20 mg  20 mg Oral Daily Asencion Noble, MD   20 mg at 06/07/20 0948  . sertraline (ZOLOFT) tablet 200 mg  200 mg Oral Daily Asencion Noble, MD   200 mg at 06/07/20 0949  . vitamin B-12 (  CYANOCOBALAMIN) tablet 1,000 mcg  1,000 mcg Oral Daily Asencion Noble, MD   1,000 mcg at 06/07/20 2376    Allergies as of 06/05/2020 - Review Complete 12/17/2019  Allergen Reaction Noted  . Coreg [carvedilol] Nausea And Vomiting and Other (See Comments) 07/26/2013  . Sulfa antibiotics Nausea And Vomiting and Other (See Comments) 10/02/2011     Review of Systems:    Constitutional: No weight loss, fever or chills Skin: No rash  Cardiovascular: No chest pain   Respiratory: No SOB  Gastrointestinal: See HPI and otherwise negative Genitourinary: No dysuria Neurological: No headache, dizziness or syncope Musculoskeletal: No new muscle or joint  pain Hematologic: No bruising Psychiatric: No history of depression or anxiety    Physical Exam:  Vital signs in last 24 hours: Temp:  [98.1 F (36.7 C)-98.6 F (37 C)] 98.2 F (36.8 C) (12/16 1427) Pulse Rate:  [72-86] 72 (12/16 1427) Resp:  [14-26] 20 (12/16 1427) BP: (98-126)/(48-61) 117/51 (12/16 1427) SpO2:  [96 %-99 %] 97 % (12/16 1427)   General:   Pleasant Caucasian male appears to be in NAD, Well developed, Well nourished, alert and cooperative Head:  Normocephalic and atraumatic. Eyes:   PEERL, EOMI. No icterus. Conjunctiva pink. Ears:  Normal auditory acuity. Neck:  Supple Throat: Oral cavity and pharynx without inflammation, swelling or lesion. Poor dentition Lungs: Respirations even and unlabored. Lungs clear to auscultation bilaterally.   No wheezes, crackles, or rhonchi.  Heart: Normal S1, S2. No MRG. Regular rate and rhythm. No peripheral edema, cyanosis or pallor.  Abdomen:  Soft, nondistended, mild epigastric ttp. No rebound or guarding. Increased BS all four quadrants, No appreciable masses or hepatomegaly. Rectal:  Not performed.  Msk:  Symmetrical without gross deformities. Peripheral pulses intact.  Extremities:  Without edema, no deformity or joint abnormality. Neurologic:  Alert and  oriented x4;  grossly normal neurologically.  Skin:   Dry and intact without significant lesions or rashes. Psychiatric: Demonstrates good judgement and reason without abnormal affect or behaviors.   LAB RESULTS: Recent Labs    06/05/20 2040 06/06/20 0704 06/07/20 0335  WBC 19.0* 15.0* 12.1*  HGB 12.3* 10.3* 10.2*  HCT 39.1 33.2* 30.7*  PLT 428* 336 318   BMET Recent Labs    06/05/20 2040 06/06/20 0704 06/07/20 0335  NA 139 137 139  K 3.2* 3.6 3.9  CL 96* 102 102  CO2 25 23 27   GLUCOSE 174* 138* 155*  BUN 26* 28* 23  CREATININE 2.92* 2.79* 1.63*  CALCIUM 9.9 8.7* 8.9   LFT Recent Labs    06/07/20 0335  PROT 5.9*  ALBUMIN 2.9*  AST 13*  ALT 9   ALKPHOS 118  BILITOT 0.6   STUDIES: US RENAL  Result Date: 06/06/2020 CLINICAL DATA:  Renal failure EXAM: RENAL / URINARY TRACT ULTRASOUND COMPLETE COMPARISON:  None. FINDINGS: Right Kidney: Renal measurements: 9.3 x 4.4 x 5.2 cm = volume: 111.2 mL. Echogenicity within normal limits. No mass or hydronephrosis visualized. Left Kidney: Renal measurements: 9.7 x 5.7 x 4.6 cm = volume: 132.6 mL. Echogenicity within normal limits. No mass or hydronephrosis visualized. Bladder: Appears normal for degree of bladder distention. IMPRESSION: No acute abnormality. Electronically Signed   By: Margaretha Sheffield MD   On: 06/06/2020 16:52   DG ABD ACUTE 2+V W 1V CHEST  Result Date: 06/06/2020 CLINICAL DATA:  69 year old male with vomiting, feeling of food getting stuck in throat. Smoker. EXAM: DG ABDOMEN ACUTE WITH 1 VIEW CHEST COMPARISON:  Chest radiographs  12/02/2019 and earlier. FINDINGS: Prior CABG. Lung volumes and mediastinal contours are within normal limits on PA upright view of the chest. Visualized tracheal air column is within normal limits. Mild bilateral increased pulmonary interstitial markings appear chronic and stable. No pneumothorax or pneumoperitoneum. No acute pulmonary opacity. Upright and supine views of the abdomen. Non obstructed bowel gas pattern. No pneumoperitoneum. Abdominal and pelvic visceral contours are within normal limits. Prior lower lumbar decompression and fusion. Advanced adjacent segment disease L3-L4. Vacuum disc. Bilateral inguinal surgical clips. Left external iliac artery vascular stent. Aortoiliac calcified atherosclerosis. No acute osseous abnormality identified. IMPRESSION: 1. Normal bowel gas pattern, no free air. 2. No acute cardiopulmonary abnormality. Prior CABG. Stable chronic probable smoking related interstitial markings. 3. Evidence of abdominal and pelvic atherosclerosis. Left external iliac artery vascular stent. 4. Prior lumbar decompression and fusion with  adjacent segment disease. Electronically Signed   By: Genevie Ann M.D.   On: 06/06/2020 05:27   PREVIOUS ENDOSCOPIES:            None   Impression / Plan:   Impression: 1.  Nausea and vomiting with early satiety: For the past month with reflux symptoms, history of ibuprofen usage 8 tabs a day for the past 10 years for back pain: concern for PUD 2.  AKI on CKD: Creatinine improving 3.  Hypertension 4.  Diabetes 5.  Anxiety 6.  CAD status post CABG on Plavix 7.  History of tobacco use: 1 pack a day for the past 2 years  Plan: 1.  Patient needs an EGD as well as a screening colonoscopy.  I am not sure he would be able to handle the bowel prep for colonoscopy, but this may be worth trying since he will have to come off of his Plavix for both of these procedures. 2.  We will need to get clearance from cardiology for the patient to get off of his Plavix for ideally 5 days prior to time of procedures. 3.  Continue antiemetics 4.  Agree with stool studies 5.  CT angio of the abdomen ordered today. 6.  Agree with Pantoprazole 40 mg, will increase to twice daily dosing 7.  Please await further recommendations from Dr. Silverio Decamp later today  Thank you for your kind consultation, we will continue to follow.  Jason [name]  06/07/2020, 4:30 PM  Addendum: 11:30 AM 06/08/2020  Spoke with Dr. Marisue Ivan from cardiology who approved holding patient's Plavix for 5 days. Will discontinue Plavix now- should be able to plan for procedures Wednesday of next week. Would try for Double if patient able to prep.  Will also need to stop heparin drip 6 hours prior to time of procedures when these are scheduled.  [place]   Attending physician's note   I have taken a history, examined the patient and reviewed the chart. I agree with the Advanced Practitioner's note, impression and recommendations.  69 year old male with CAD s/p CABG, peripheral arterial disease on chronic Plavix admitted with progressive solid  dysphagia, anorexia, abdominal pain, significant weight loss and protein energy malnutrition  He is scheduled for CT angio of the abdomen  We will proceed with EGD and colonoscopy to further evaluate, plan for esophageal dilation has evidence of peptic stricture or esophageal ring.  Will need to exclude any neoplasia or malignancy. Hold Plavix for 5 days in case of potential endoscopic intervention and decrease risk for bleeding, we will tentatively plan for EGD and colonoscopy early next week on Tuesday Continue pantoprazole 40 mg  twice daily Soft diet as tolerated  The risks and benefits as well as alternatives of endoscopic procedure(s) have been discussed and reviewed. All questions answered. The patient agrees to proceed.  GI will not round over the weekend, is available if needed.  Dr. Collene Mares will be covering for [place] this weekend   The patient was provided an opportunity to ask questions and all were answered. The patient agreed with the plan and demonstrated an understanding of the instructions.  Damaris Hippo , MD [phone]

## 2020-06-07 NOTE — ED Notes (Signed)
Breakfast ordered 

## 2020-06-08 ENCOUNTER — Observation Stay (HOSPITAL_COMMUNITY): Payer: Medicare Other

## 2020-06-08 ENCOUNTER — Other Ambulatory Visit: Payer: Self-pay

## 2020-06-08 DIAGNOSIS — R1310 Dysphagia, unspecified: Secondary | ICD-10-CM | POA: Diagnosis not present

## 2020-06-08 DIAGNOSIS — E785 Hyperlipidemia, unspecified: Secondary | ICD-10-CM | POA: Diagnosis present

## 2020-06-08 DIAGNOSIS — K529 Noninfective gastroenteritis and colitis, unspecified: Secondary | ICD-10-CM | POA: Diagnosis not present

## 2020-06-08 DIAGNOSIS — D125 Benign neoplasm of sigmoid colon: Secondary | ICD-10-CM | POA: Diagnosis present

## 2020-06-08 DIAGNOSIS — E440 Moderate protein-calorie malnutrition: Secondary | ICD-10-CM | POA: Diagnosis not present

## 2020-06-08 DIAGNOSIS — Z20822 Contact with and (suspected) exposure to covid-19: Secondary | ICD-10-CM | POA: Diagnosis not present

## 2020-06-08 DIAGNOSIS — I25810 Atherosclerosis of coronary artery bypass graft(s) without angina pectoris: Secondary | ICD-10-CM | POA: Diagnosis not present

## 2020-06-08 DIAGNOSIS — I5042 Chronic combined systolic (congestive) and diastolic (congestive) heart failure: Secondary | ICD-10-CM | POA: Diagnosis not present

## 2020-06-08 DIAGNOSIS — E039 Hypothyroidism, unspecified: Secondary | ICD-10-CM | POA: Diagnosis not present

## 2020-06-08 DIAGNOSIS — Z7902 Long term (current) use of antithrombotics/antiplatelets: Secondary | ICD-10-CM | POA: Diagnosis not present

## 2020-06-08 DIAGNOSIS — K648 Other hemorrhoids: Secondary | ICD-10-CM | POA: Diagnosis present

## 2020-06-08 DIAGNOSIS — D509 Iron deficiency anemia, unspecified: Secondary | ICD-10-CM | POA: Diagnosis present

## 2020-06-08 DIAGNOSIS — K222 Esophageal obstruction: Secondary | ICD-10-CM | POA: Diagnosis present

## 2020-06-08 DIAGNOSIS — E538 Deficiency of other specified B group vitamins: Secondary | ICD-10-CM | POA: Diagnosis present

## 2020-06-08 DIAGNOSIS — N189 Chronic kidney disease, unspecified: Secondary | ICD-10-CM | POA: Diagnosis not present

## 2020-06-08 DIAGNOSIS — L97509 Non-pressure chronic ulcer of other part of unspecified foot with unspecified severity: Secondary | ICD-10-CM

## 2020-06-08 DIAGNOSIS — K635 Polyp of colon: Secondary | ICD-10-CM | POA: Diagnosis not present

## 2020-06-08 DIAGNOSIS — N179 Acute kidney failure, unspecified: Secondary | ICD-10-CM | POA: Diagnosis not present

## 2020-06-08 DIAGNOSIS — E1151 Type 2 diabetes mellitus with diabetic peripheral angiopathy without gangrene: Secondary | ICD-10-CM | POA: Diagnosis present

## 2020-06-08 DIAGNOSIS — G8929 Other chronic pain: Secondary | ICD-10-CM

## 2020-06-08 DIAGNOSIS — E1122 Type 2 diabetes mellitus with diabetic chronic kidney disease: Secondary | ICD-10-CM | POA: Diagnosis not present

## 2020-06-08 DIAGNOSIS — E860 Dehydration: Secondary | ICD-10-CM | POA: Diagnosis not present

## 2020-06-08 DIAGNOSIS — E11621 Type 2 diabetes mellitus with foot ulcer: Secondary | ICD-10-CM

## 2020-06-08 DIAGNOSIS — R197 Diarrhea, unspecified: Secondary | ICD-10-CM | POA: Diagnosis not present

## 2020-06-08 DIAGNOSIS — R112 Nausea with vomiting, unspecified: Secondary | ICD-10-CM | POA: Diagnosis present

## 2020-06-08 DIAGNOSIS — I130 Hypertensive heart and chronic kidney disease with heart failure and stage 1 through stage 4 chronic kidney disease, or unspecified chronic kidney disease: Secondary | ICD-10-CM | POA: Diagnosis not present

## 2020-06-08 DIAGNOSIS — R64 Cachexia: Secondary | ICD-10-CM | POA: Diagnosis not present

## 2020-06-08 DIAGNOSIS — D638 Anemia in other chronic diseases classified elsewhere: Secondary | ICD-10-CM | POA: Diagnosis present

## 2020-06-08 DIAGNOSIS — R6881 Early satiety: Secondary | ICD-10-CM | POA: Diagnosis present

## 2020-06-08 DIAGNOSIS — D122 Benign neoplasm of ascending colon: Secondary | ICD-10-CM | POA: Diagnosis present

## 2020-06-08 DIAGNOSIS — D123 Benign neoplasm of transverse colon: Secondary | ICD-10-CM | POA: Diagnosis present

## 2020-06-08 DIAGNOSIS — E871 Hypo-osmolality and hyponatremia: Secondary | ICD-10-CM | POA: Diagnosis not present

## 2020-06-08 DIAGNOSIS — I452 Bifascicular block: Secondary | ICD-10-CM | POA: Diagnosis not present

## 2020-06-08 DIAGNOSIS — M549 Dorsalgia, unspecified: Secondary | ICD-10-CM

## 2020-06-08 LAB — CBC WITH DIFFERENTIAL/PLATELET
Abs Immature Granulocytes: 0.05 10*3/uL (ref 0.00–0.07)
Basophils Absolute: 0 10*3/uL (ref 0.0–0.1)
Basophils Relative: 0 %
Eosinophils Absolute: 0.3 10*3/uL (ref 0.0–0.5)
Eosinophils Relative: 3 %
HCT: 27.8 % — ABNORMAL LOW (ref 39.0–52.0)
Hemoglobin: 9.3 g/dL — ABNORMAL LOW (ref 13.0–17.0)
Immature Granulocytes: 1 %
Lymphocytes Relative: 20 %
Lymphs Abs: 1.9 10*3/uL (ref 0.7–4.0)
MCH: 30.3 pg (ref 26.0–34.0)
MCHC: 33.5 g/dL (ref 30.0–36.0)
MCV: 90.6 fL (ref 80.0–100.0)
Monocytes Absolute: 0.7 10*3/uL (ref 0.1–1.0)
Monocytes Relative: 8 %
Neutro Abs: 6.6 10*3/uL (ref 1.7–7.7)
Neutrophils Relative %: 68 %
Platelets: 257 10*3/uL (ref 150–400)
RBC: 3.07 MIL/uL — ABNORMAL LOW (ref 4.22–5.81)
RDW: 14.2 % (ref 11.5–15.5)
WBC: 9.5 10*3/uL (ref 4.0–10.5)
nRBC: 0 % (ref 0.0–0.2)

## 2020-06-08 LAB — COMPREHENSIVE METABOLIC PANEL
ALT: 8 U/L (ref 0–44)
AST: 9 U/L — ABNORMAL LOW (ref 15–41)
Albumin: 2.6 g/dL — ABNORMAL LOW (ref 3.5–5.0)
Alkaline Phosphatase: 108 U/L (ref 38–126)
Anion gap: 9 (ref 5–15)
BUN: 17 mg/dL (ref 8–23)
CO2: 23 mmol/L (ref 22–32)
Calcium: 8.7 mg/dL — ABNORMAL LOW (ref 8.9–10.3)
Chloride: 103 mmol/L (ref 98–111)
Creatinine, Ser: 1.22 mg/dL (ref 0.61–1.24)
GFR, Estimated: >60 mL/min (ref >60)
Glucose, Bld: 106 mg/dL — ABNORMAL HIGH (ref 70–99)
Potassium: 3.8 mmol/L (ref 3.5–5.1)
Sodium: 135 mmol/L (ref 135–145)
Total Bilirubin: 0.5 mg/dL (ref 0.3–1.2)
Total Protein: 5.8 g/dL — ABNORMAL LOW (ref 6.5–8.1)

## 2020-06-08 LAB — GLUCOSE, CAPILLARY
Glucose-Capillary: 106 mg/dL — ABNORMAL HIGH (ref 70–99)
Glucose-Capillary: 97 mg/dL (ref 70–99)
Glucose-Capillary: 111 mg/dL — ABNORMAL HIGH (ref 70–99)
Glucose-Capillary: 99 mg/dL (ref 70–99)

## 2020-06-08 MED ORDER — IOHEXOL 350 MG/ML SOLN
100.0000 mL | Freq: Once | INTRAVENOUS | Status: AC | PRN
  Administered 2020-06-08: 100 mL via INTRAVENOUS

## 2020-06-08 MED ORDER — PANTOPRAZOLE SODIUM 40 MG PO TBEC
40.0000 mg | DELAYED_RELEASE_TABLET | Freq: Two times a day (BID) | ORAL | Status: DC
  Administered 2020-06-09 – 2020-06-14 (×11): 40 mg via ORAL
  Filled 2020-06-08 (×12): qty 1

## 2020-06-08 NOTE — Progress Notes (Signed)
Subjective:   Mr. Favor states that he had excruciating pain overnight in his abdomen. He states his pain occurred after dinner but resolved this morning. Overall, Mr. Denio notes his pain tends to be after eating usually within 10-15 minutes, although there are times it occurs without eating. He also feels that what he eats gets stuck in his lower chest and causes vomiting of material from days before. He endorses a lot of gas, burping with foul taste with burning in his chest and ribs. Endorses chronic back pain, worsened recently with movement.   Objective:  Vital signs in last 24 hours: Vitals:   06/07/20 1427 06/08/20 0027 06/08/20 0438 06/08/20 0807  BP: (!) 117/51 (!) 125/56 135/63 (!) 105/51  Pulse: 72 76 73 70  Resp: 20 18 18    Temp: 98.2 F (36.8 C) 98.9 F (37.2 C) 98.4 F (36.9 C)   TempSrc: Oral Oral Oral   SpO2: 97% 98% 97%   Weight:   65.6 kg   Height:       Physical Exam Vitals and nursing note reviewed.  Constitutional:      General: He is not in acute distress.    Appearance: He is normal weight.  Pulmonary:     Effort: Pulmonary effort is normal. No respiratory distress.  Abdominal:     General: Bowel sounds are normal. There is no distension.     Palpations: Abdomen is soft. There is no mass.     Tenderness: There is no abdominal tenderness. There is no guarding.  Skin:    General: Skin is warm and dry.  Neurological:     General: No focal deficit present.     Mental Status: He is alert and oriented to person, place, and time. Mental status is at baseline.  Psychiatric:        Mood and Affect: Mood normal.        Behavior: Behavior normal.    Assessment/Plan:   Active Problems:   AKI (acute kidney injury) ([place])   Dehydration   Nausea and vomiting   Diarrhea  69 year old male with a history of anxiety, PAD, CAD s/p CABG 6629 combined systolic and diastolic heart failure, hypothyroidism, diabetes mellitus, pretension, and hyperlipidemia who  presented with 1 month history of a dysphagia, N/V/D, noted to have an AKI.   # AKI: Creatinine continues to improve and is nearly at baseline.   - Trend creatinine  - Encourage oral intake - Avoid nephrotoxic agents, including NSAIDs  # Nausea/vomiting/diarrhea, early satiety: Concerning for PUD (2/2 to chronic NSAID use) versus gastroparesis versus malignancy (given extensive smoking history). Obtaining stool studies to rule out infectious causes versus malabsorption. CTA obtained today due to extensive vascular disease history and postprandial symptoms; results negative for mesenteric ischemia.   - GI following; appreciate their recommendations - Protonix 40 mg BID - Zofran PRN - Follow up stool studies  # HTN:  Well controlled.   - Continue home Metoprolol   # Chronic Normocytic anemia:  Stable.   - Daily CBC  # T2DM # Diabetic Foot Ulcer Latest A1c is 6.5. Foot xray yesterday negative for osteomyelitis or notable soft tissue swelling.   - SSI   # Hypothyroidism: - Continue levothyroxine 50 mcg daily  Prior to Admission Living Arrangement: Home Anticipated Discharge Location: Home Barriers to Discharge: Ongoing medical management Dispo: Anticipated discharge in approximately 2-3 day(s).   Dr. Jose Persia Internal Medicine PGY-2  Pager: [phone] After 5pm on weekdays and 1pm  on weekends: On Call pager [phone]  06/08/2020, 1:37 PM

## 2020-06-08 NOTE — Evaluation (Signed)
Clinical/Bedside Swallow Evaluation Patient Details  Name: Jason [name] MRN: [id number] Date of Birth: Sep 26, 1950  Today's Date: 06/08/2020 Time: SLP Start Time (ACUTE ONLY): 1228 SLP Stop Time (ACUTE ONLY): 1238 SLP Time Calculation (min) (ACUTE ONLY): 10 min  Past Medical History:  Past Medical History:  Diagnosis Date  . Anxiety    off xanax  and paxil since 3/13  . Arthritis   . Bilateral carotid artery disease ([place])    s/p R ICA stent 03/28/2015 with distal protection. Known chronically occluded L ICA. Carotid stent complicated by hypotension and acute stroke in [place]  . Cancer ([place])    tumor basal cell rem from lft arm  . Cataract   . COPD (chronic obstructive pulmonary disease) ([place])   . Coronary artery disease    a. s/p CABG in 2011 with LIMA-LAD, SVG-RI/OM, and SVG-PDA b. occluded SVG-PDA by cath in 2015 c. 10/2016: NSTEMI with cath showing thrombus along the distal graft to insertion of SVG-OM2 with DES placed.   . CVA (cerebral infarction)    occured on 10/10 several days after R ICA carotid stenting  . Depression   . Diabetes mellitus   . GERD (gastroesophageal reflux disease)   . Heart attack ([place])   . High cholesterol   . Hypertension    type 2 NIDDM  . Myocardial infarct (H[facility])    x4 last 10 yrs  . Pneumonia    hx  . 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 Smoker   . Substance abuse [facility])    Past Surgical History:  Past Surgical History:  Procedure Laterality Date  . ABDOMINAL AORTOGRAM W/LOWER EXTREMITY Bilateral 05/31/2019   Procedure: ABDOMINAL AORTOGRAM W/LOWER EXTREMITY;  Surgeon: Waynetta Sandy, MD;  Location: [place] CV LAB;  Service: Cardiovascular;  Laterality: Bilateral;  . AMPUTATION TOE Right 06/02/2019   Procedure: Amputation Right Great Toe and Second Toe;  Surgeon: Waynetta Sandy, MD;  Location: [place];  Service: Vascular;  Laterality: Right;  . BACK SURGERY  Jan  2014, Dec 2011   Dr Vertell Limber  . BYPASS GRAFT FEMORAL-PERONEAL Left 11/29/2019   Procedure: Left Bypass Graft Femoral-Peroneal using non reversed Greater Sapphenous vein;  Surgeon: Waynetta Sandy, MD;  Location: [place];  Service: Vascular;  Laterality: Left;  . CARDIAC CATHETERIZATION  4/12   Medical Rx  . CORONARY ANGIOGRAM  01/17/14   med rx  . CORONARY ANGIOGRAM  4/13   med Rx  . CORONARY ANGIOGRAM  1/15   Med Rx  . CORONARY ANGIOPLASTY  Jan 2004   RCA  . CORONARY ARTERY BYPASS GRAFT  07/24/2009   L-LAD, SVG-RI/OM, SVG-PDA  . CORONARY STENT INTERVENTION N/A 10/23/2016   Procedure: Coronary Stent Intervention;  Surgeon: Peter M Martinique, MD;  Location: [place] [facility];  Service: Cardiovascular;  Laterality: N/A;  . ENDARTERECTOMY FEMORAL Right 06/02/2019   Procedure: Endarterectomy External Iliac  Femoral Artery and Profunda;  Surgeon: Waynetta Sandy, MD;  Location: [place];  Service: Vascular;  Laterality: Right;  . EYE SURGERY     cat bil  . FEMORAL ARTERY - FEMORAL ARTERY BYPASS GRAFT Right 2004   femoral enarterectomy  . FEMORAL-POPLITEAL BYPASS GRAFT Right 06/02/2019   Procedure: RIGHT LEG BYPASS GRAFT FEMORAL-POPLITEAL ARTERY using Gore Propaten Vascular Graft Removable Ring;  Surgeon: Waynetta Sandy, MD;  Location: [place];  Service: Vascular;  Laterality: Right;  . LEFT HEART CATH AND CORS/GRAFTS ANGIOGRAPHY N/A 10/23/2016  Procedure: Left Heart Cath and Cors/Grafts Angiography;  Surgeon: Peter M Martinique, MD;  Location: [place] CV LAB;  Service: Cardiovascular;  Laterality: N/A;  . LEFT HEART CATHETERIZATION WITH CORONARY ANGIOGRAM N/A 10/03/2011   Procedure: LEFT HEART CATHETERIZATION WITH CORONARY ANGIOGRAM;  Surgeon: Pixie Casino, MD;  Location: [facility] CATH LAB;  Service: Cardiovascular;  Laterality: N/A;  . LEFT HEART CATHETERIZATION WITH CORONARY ANGIOGRAM N/A 07/08/2013   Procedure: LEFT HEART CATHETERIZATION WITH CORONARY ANGIOGRAM;  Surgeon: Blane Ohara, MD;  Location: [facility] CATH LAB;  Service: Cardiovascular;  Laterality: N/A;  . LEFT HEART CATHETERIZATION WITH CORONARY/GRAFT ANGIOGRAM N/A 01/17/2014   Procedure: LEFT HEART CATHETERIZATION WITH Beatrix Fetters;  Surgeon: Troy Sine, MD;  Location: [facility] CATH LAB;  Service: Cardiovascular;  Laterality: N/A;  . LOWER EXTREMITY ANGIOGRAPHY N/A 11/28/2019   Procedure: LOWER EXTREMITY ANGIOGRAPHY;  Surgeon: Waynetta Sandy, MD;  Location: [place] [facility] LAB;  Service: Cardiovascular;  Laterality: N/A;  . PATCH ANGIOPLASTY Right 06/02/2019   Procedure: Patch Angioplasty using Hemashield Platinum Finesse Patch of the External Iliac Femoral Artery and Profunda;  Surgeon: Waynetta Sandy, MD;  Location: [place];  Service: Vascular;  Laterality: Right;  . PERIPHERAL VASCULAR BALLOON ANGIOPLASTY Right 11/28/2019   Procedure: PERIPHERAL VASCULAR BALLOON ANGIOPLASTY;  Surgeon: Waynetta Sandy, MD;  Location: [place] CV LAB;  Service: Cardiovascular;  Laterality: Right;  Common femoral  . PERIPHERAL VASCULAR CATHETERIZATION N/A 03/28/2015   Procedure: Carotid PTA/Stent Intervention;  Surgeon: Lorretta Harp, MD;  Location: [place] [facility] LAB;  Service: Cardiovascular;  Laterality: N/A;  . PERIPHERAL VASCULAR INTERVENTION Left 11/28/2019   Procedure: PERIPHERAL VASCULAR INTERVENTION;  Surgeon: Waynetta Sandy, MD;  Location: [place] CV LAB;  Service: Cardiovascular;  Laterality: Left;  external iliac  . US EXTREMITY*L*     lft arm tumor removed    HPI:  69 year old male with a history of anxiety, PAD, CAD s/p CABG 5573 combined systolic and diastolic heart failure, hypothyroidism, diabetes mellitus, pretension, and hyperlipidemia who presented with 1 month history of a early sensation of fullness, possible food impaction , and nausea and vomiting which improved with GI cocktail, noted to have an AKI.   Assessment / Plan / Recommendation Clinical  Impression  Pt participated in brief clinical assessment of swallow function.  He is good historian, and describes issues that are related to UGI system but not impairments of oropharyngeal function.  CN and oral mechanism exam were normal.  He consumed water with no concerns for aspiration. There was consistent belching after the swallow.  No further POs were given due to his discomfort.  [facility] has evaluated him and are planning to proceed with an EGD.  No further SLP f/u is needed given no concerns for an oropharyngeal dysphagia. Continue diet per GI.  Our service will sign off. SLP Visit Diagnosis: Dysphagia, unspecified (R13.10)    Aspiration Risk    minimal   Diet Recommendation   Per GI       Other  Recommendations Recommended Consults:  (GI is following)   Follow up Recommendations          Swallow Study   General HPI: 69 year old male with a history of anxiety, PAD, CAD s/p CABG 2202 combined systolic and diastolic heart failure, hypothyroidism, diabetes mellitus, pretension, and hyperlipidemia who presented with 1 month history of a early sensation of fullness, possible food impaction , and nausea and vomiting which improved with GI cocktail,  noted to have an AKI. Type of Study: Bedside Swallow Evaluation Previous Swallow Assessment: no Diet Prior to this Study: Regular;Thin liquids Temperature Spikes Noted: No Respiratory Status: Room air History of Recent Intubation: No Behavior/Cognition: Alert;Cooperative;Pleasant mood Oral Cavity Assessment: Within Functional Limits Oral Care Completed by SLP: No Oral Cavity - Dentition: Poor condition Vision: Functional for self-feeding Self-Feeding Abilities: Able to feed self Patient Positioning: Upright in bed Baseline Vocal Quality: Normal Volitional Cough: Strong Volitional Swallow: Able to elicit    Oral/Motor/Sensory Function Overall Oral Motor/Sensory Function: Within functional limits   Ice Chips Ice chips: Not  tested   Thin Liquid Thin Liquid: Within functional limits    Nectar Thick Nectar Thick Liquid: Not tested   Honey Thick Honey Thick Liquid: Not tested   Puree Puree: Not tested   Solid     Solid: Not tested      Juan Quam Laurice 06/08/2020,12:49 PM  Estill Bamberg L. Tivis Ringer, [place] Office number [phone] Pager [phone]

## 2020-06-09 LAB — CBC WITH DIFFERENTIAL/PLATELET
Abs Immature Granulocytes: 0.05 10*3/uL (ref 0.00–0.07)
Basophils Absolute: 0 10*3/uL (ref 0.0–0.1)
Basophils Relative: 0 %
Eosinophils Absolute: 0.1 10*3/uL (ref 0.0–0.5)
Eosinophils Relative: 1 %
HCT: 30.6 % — ABNORMAL LOW (ref 39.0–52.0)
Hemoglobin: 9.9 g/dL — ABNORMAL LOW (ref 13.0–17.0)
Immature Granulocytes: 1 %
Lymphocytes Relative: 19 %
Lymphs Abs: 1.8 10*3/uL (ref 0.7–4.0)
MCH: 29.1 pg (ref 26.0–34.0)
MCHC: 32.4 g/dL (ref 30.0–36.0)
MCV: 90 fL (ref 80.0–100.0)
Monocytes Absolute: 0.8 10*3/uL (ref 0.1–1.0)
Monocytes Relative: 9 %
Neutro Abs: 6.7 10*3/uL (ref 1.7–7.7)
Neutrophils Relative %: 70 %
Platelets: 254 10*3/uL (ref 150–400)
RBC: 3.4 MIL/uL — ABNORMAL LOW (ref 4.22–5.81)
RDW: 14 % (ref 11.5–15.5)
WBC: 9.6 10*3/uL (ref 4.0–10.5)
nRBC: 0 % (ref 0.0–0.2)

## 2020-06-09 LAB — GASTROINTESTINAL PANEL BY PCR, STOOL (REPLACES STOOL CULTURE)

## 2020-06-09 LAB — BASIC METABOLIC PANEL
Anion gap: 11 (ref 5–15)
BUN: 15 mg/dL (ref 8–23)
CO2: 20 mmol/L — ABNORMAL LOW (ref 22–32)
Calcium: 8.5 mg/dL — ABNORMAL LOW (ref 8.9–10.3)
Chloride: 103 mmol/L (ref 98–111)
Creatinine, Ser: 1.1 mg/dL (ref 0.61–1.24)
GFR, Estimated: >60 mL/min (ref >60)
Glucose, Bld: 88 mg/dL (ref 70–99)
Potassium: 3.4 mmol/L — ABNORMAL LOW (ref 3.5–5.1)
Sodium: 134 mmol/L — ABNORMAL LOW (ref 135–145)

## 2020-06-09 LAB — GLUCOSE, CAPILLARY
Glucose-Capillary: 87 mg/dL (ref 70–99)
Glucose-Capillary: 74 mg/dL (ref 70–99)
Glucose-Capillary: 113 mg/dL — ABNORMAL HIGH (ref 70–99)
Glucose-Capillary: 119 mg/dL — ABNORMAL HIGH (ref 70–99)

## 2020-06-09 MED ORDER — POTASSIUM CHLORIDE 20 MEQ PO PACK
40.0000 meq | PACK | Freq: Once | ORAL | Status: AC
  Administered 2020-06-09: 09:00:00 40 meq via ORAL
  Filled 2020-06-09: qty 2

## 2020-06-09 MED ORDER — LACTATED RINGERS IV BOLUS
1000.0000 mL | Freq: Once | INTRAVENOUS | Status: AC
  Administered 2020-06-09: 11:00:00 1000 mL via INTRAVENOUS

## 2020-06-09 MED ORDER — ENSURE ENLIVE PO LIQD
237.0000 mL | Freq: Three times a day (TID) | ORAL | Status: DC
  Administered 2020-06-09 – 2020-06-12 (×2): 237 mL via ORAL

## 2020-06-09 NOTE — Evaluation (Signed)
Occupational Therapy Evaluation Patient Details Name: Jason [name] MRN: [id number] DOB: [birth year] Today's Date: 06/09/2020    History of Present Illness Pt is a 69 y/o male admitted secondary to AKI and nausea/vomiting. PMH includes CAD s/p CABG, tobacco use, COPD, DM, and HTN.   Clinical Impression   This 69 y/o male presents with the above. PTA pt reports living alone and performing ADL and mobility tasks independently, though reports some difficulty with iADL given ongoing back pain. Today pt performing mobility and ADL tasks grossly with minguard assist without AD. Of note pt mildly unsteady without use of AD and often seeing out UE support when available during mobility in room. Pt reports fatigue with activity but did tolerate x3 standing grooming ADL tasks prior to taking seated rest. He reports his brother lives nearby and can assist PRN after d/c. Pt to benefit from continued acute OT services to maximize his overall safety and independence with ADL and mobility. Pending progress do not anticipate pt to require follow up OT services after discharge.     Follow Up Recommendations  No OT follow up;Supervision/Assistance - 24 hour (24hr initially)    Equipment Recommendations  None recommended by OT (pt's DME needs are met)           Precautions / Restrictions Precautions Precautions: Fall Restrictions Weight Bearing Restrictions: No      Mobility Bed Mobility Overal bed mobility: Needs Assistance Bed Mobility: Supine to Sit;Sit to Supine     Supine to sit: Supervision Sit to supine: Supervision   General bed mobility comments: Supervision for safety.    Transfers Overall transfer level: Needs assistance Equipment used: None Transfers: Sit to/from Stand Sit to Stand: Min guard         General transfer comment: Min guard for safety. stood from EOB and toilet    Balance Overall balance assessment: Needs assistance Sitting-balance support: No upper  extremity supported;Feet supported Sitting balance-Leahy Scale: Good     Standing balance support: No upper extremity supported;During functional activity Standing balance-Leahy Scale: Fair Standing balance comment: tends to rely on UE support when it's availalbe                           ADL either performed or assessed with clinical judgement   ADL Overall ADL's : Needs assistance/impaired Eating/Feeding: Modified independent;Sitting   Grooming: Min guard;Standing;Wash/dry hands;Wash/dry face;Oral care Grooming Details (indicate cue type and reason): standing at sink Upper Body Bathing: Modified independent;Sitting   Lower Body Bathing: Min guard;Sit to/from stand   Upper Body Dressing : Modified independent;Sitting   Lower Body Dressing: Min guard;Sit to/from stand Lower Body Dressing Details (indicate cue type and reason): for standing balance Toilet Transfer: Min guard;Ambulation;Regular Toilet;Grab bars Toilet Transfer Details (indicate cue type and reason): pt mildly unsteady with mobility, tendency to seek UE support on items in room Toileting- Clothing Manipulation and Hygiene: Min guard;Sit to/from stand;Sitting/lateral lean Toileting - Clothing Manipulation Details (indicate cue type and reason): pt performing clothing management (pants) and posterior pericare without assist, minguard for balance and safety     Functional mobility during ADLs: Min guard                           Pertinent Vitals/Pain Pain Assessment: Faces Faces Pain Scale: Hurts little more Pain Location: back Pain Descriptors / Indicators: Aching;Discomfort Pain Intervention(s): Monitored during session;Repositioned     Hand Dominance  Extremity/Trunk Assessment Upper Extremity Assessment Upper Extremity Assessment: Overall WFL for tasks assessed   Lower Extremity Assessment Lower Extremity Assessment: Defer to PT evaluation   Cervical / Trunk Assessment Cervical  / Trunk Assessment: Normal   Communication Communication Communication: No difficulties   Cognition Arousal/Alertness: Awake/alert Behavior During Therapy: WFL for tasks assessed/performed Overall Cognitive Status: Within Functional Limits for tasks assessed                                     General Comments       Exercises     Shoulder Instructions      Home Living Family/patient expects to be discharged to:: Private residence Living Arrangements: Alone Available Help at Discharge: Family;Available PRN/intermittently (brother can assist) Type of Home: House Home Access: Stairs to enter [employer] of Steps: 7 Entrance Stairs-Rails: Right;Left Home Layout: One level     Bathroom Shower/Tub: Tub/shower unit;Walk-in shower   Bathroom Toilet: Handicapped height     Home Equipment: Environmental consultant - 2 wheels;Bedside commode;Shower seat          Prior Functioning/Environment Level of Independence: Independent with assistive device(s)        Comments: Occasionally uses a RW when back pain flares        OT Problem List: Decreased strength;Decreased range of motion;Decreased activity tolerance;Impaired balance (sitting and/or standing);Decreased knowledge of use of DME or AE      OT Treatment/Interventions: Self-care/ADL training;Therapeutic exercise;Energy conservation;DME and/or AE instruction;Therapeutic activities;Balance training;Patient/family education    OT Goals(Current goals can be found in the care plan section) Acute Rehab OT Goals Patient Stated Goal: to go home OT Goal Formulation: With patient Time For Goal Achievement: 06/23/20 Potential to Achieve Goals: Good  OT Frequency: Min 2X/week   Barriers to D/C:            Co-evaluation              AM-PAC OT "6 Clicks" Daily Activity     Outcome Measure Help from another person eating meals?: None Help from another person taking care of personal grooming?: A Little Help  from another person toileting, which includes using toliet, bedpan, or urinal?: A Little Help from another person bathing (including washing, rinsing, drying)?: A Little Help from another person to put on and taking off regular upper body clothing?: None Help from another person to put on and taking off regular lower body clothing?: A Little 6 Click Score: 20   End of Session Nurse Communication: Mobility status  Activity Tolerance: Patient tolerated treatment well Patient left: in bed;with call bell/phone within reach  OT Visit Diagnosis: Muscle weakness (generalized) (M62.81);Unsteadiness on feet (R26.81)                Time: 1308-6578 OT Time Calculation (min): 27 min Charges:  OT General Charges $OT Visit: 1 Visit OT Evaluation $OT Eval Moderate Complexity: 1 Mod OT Treatments $Self Care/Home Management : 8-22 mins  Lou Cal, OT Acute Rehabilitation Services Pager [phone] Office [place] 06/09/2020, 1:41 PM

## 2020-06-09 NOTE — Progress Notes (Signed)
Subjective: HD#3 No acute overnight events. Mr. Lara evaluated at bedside this AM. He states he feels tired and was not able to keep his breakfast down. Notes had multiple bowel movements last night, diarrhea around 1 full cup each time. Endorses nausea now.   Objective:  Vital signs in last 24 hours: Vitals:   06/08/20 1206 06/08/20 1714 06/09/20 0011 06/09/20 0511  BP: (!) 127/57 129/60 124/64 123/60  Pulse: 70 72 72 71  Resp: 18 18 18 16   Temp: 98.1 F (36.7 C) 97.7 F (36.5 C) 97.8 F (36.6 C) 98.1 F (36.7 C)  TempSrc: Oral Oral Oral Oral  SpO2: 97% 96% 97% 98%  Weight:    63.4 kg  Height:       CBC Latest Ref Rng & Units 06/09/2020 06/08/2020 06/07/2020  WBC 4.0 - 10.5 K/uL 9.6 9.5 12.1(H)  Hemoglobin 13.0 - 17.0 g/dL 9.9(L) 9.3(L) 10.2(L)  Hematocrit 39.0 - 52.0 % 30.6(L) 27.8(L) 30.7(L)  Platelets 150 - 400 K/uL 254 257 318   BMP Latest Ref Rng & Units 06/09/2020 06/08/2020 06/07/2020  Glucose 70 - 99 mg/dL 88 106(H) 155(H)  BUN 8 - 23 mg/dL 15 17 23   Creatinine 0.61 - 1.24 mg/dL 1.10 1.22 1.63(H)  BUN/Creat Ratio 10 - 24 - - -  Sodium 135 - 145 mmol/L 134(L) 135 139  Potassium 3.5 - 5.1 mmol/L 3.4(L) 3.8 3.9  Chloride 98 - 111 mmol/L 103 103 102  CO2 22 - 32 mmol/L 20(L) 23 27  Calcium 8.9 - 10.3 mg/dL 8.5(L) 8.7(L) 8.9   Physical Exam Vitals and nursing note reviewed.  Constitutional:      General: He is not in acute distress.    Appearance: He is normal weight.  Pulmonary:     Effort: Pulmonary effort is normal. No respiratory distress.  Abdominal:     General: Bowel sounds are normal. There is no distension.     Palpations: Abdomen is soft.     Tenderness: There is no abdominal tenderness. There is no guarding.  Skin:    General: Skin is warm and dry.  Neurological:     General: No focal deficit present.     Mental Status: He is alert and oriented to person, place, and time. Mental status is at baseline.  Psychiatric:        Mood and Affect:  Mood normal.        Behavior: Behavior normal.    Assessment/Plan:  Mr. Montagna is a 69 year old male with a history of anxiety, PAD, CAD s/p CABG 0962 combined systolic and diastolic heart failure, hypothyroidism, diabetes mellitus, pretension, and hyperlipidemia who presented with 1 month history of a dysphagia, N/V/D, noted to have an AKI .  Active Problems:   AKI (acute kidney injury) ([place])   Dehydration   Emesis   Diarrhea   # Nausea/vomiting/diarrhea, early satiety: Concerning for PUD (2/2 to chronic NSAID use) versus gastroparesis versus malignancy (given extensive smoking history). Obtaind stool studies to rule out infectious causes versus malabsorption, still waiting on results. CTA results negative for mesenteric ischemia. As per GI holding Plavix for 3-4 days to do Upper endoscopy and colonoscopy. Due to ongoing diarrhea, will replenish fluids. Speech and swallow yesterday did not have any concerns and signed off, appreciate their assistance.   - 1st day of Holding Plavix - GI following; appreciate their recommendations - Protonix 40 mg BID - Zofran PRN - Follow up stool studies - IVF 100 ml -Kcl 40 meq once  #  AKI: Improved sCr ~1.10, BUN 15- improved and at baseline.  - Trend creatinine  - Encourage oral intake - Avoid nephrotoxic agents, including NSAIDs  # HTN:  Well controlled.   - Continue home Metoprolol   # Chronic Normocytic anemia:  Stable.   - Daily CBC  # T2DM # Diabetic Foot Ulcer Latest A1c is 6.5. Foot xray yesterday negative for osteomyelitis or notable soft tissue swelling.   - SSI   # Hypothyroidism: - Continue levothyroxine 50 mcg daily  Prior to Admission Living Arrangement: Home Anticipated Discharge Location: Home Barriers to Discharge: Ongoing medical management Dispo: Anticipated discharge in approximately 4-5 day(s).   Dr. Meredith Staggers [facility] PGY-1  Pager: [phone] After 5pm on weekdays and 1pm on weekends:  On Call pager [phone]  06/09/2020, 10:02 AM

## 2020-06-09 NOTE — Progress Notes (Signed)
Initial Nutrition Assessment  DOCUMENTATION CODES:   Not applicable  INTERVENTION:  Provide Ensure Enlive po TID, each supplement provides 350 kcal and 20 grams of protein.  Encourage adequate PO intake.  NUTRITION DIAGNOSIS:   Increased nutrient needs related to chronic illness (CHF) as evidenced by estimated needs.  GOAL:   Patient will meet greater than or equal to 90% of their needs  MONITOR:   PO intake,Supplement acceptance,Skin,Weight trends,Labs,I & O's  REASON FOR ASSESSMENT:   Malnutrition Screening Tool    ASSESSMENT:   69 year old male with a history of anxiety, PAD, CAD s/p CABG 0488 combined systolic and diastolic heart failure, hypothyroidism, diabetes mellitus, pretension, and hyperlipidemia who presented with 1 month history of a dysphagia, N/V/D, noted to have an AKI  Pt unavailable during attempted time of contact. RD unable to obtain pt nutrition history at this time. Per MD note, pt reports dysphagia and early satiety which had been ongoing over the past 1 month. Per SLP evaluation yesterday, pt with no concerns for oropharyngeal dysphagia. Pt with n/v/d, plans for EGD and colonoscopy per MD. RD to order nutritional supplements to aid in caloric and protein needs. Unable to complete Nutrition-Focused physical exam at this time. Per weight records, pt with a 11.5% weight loss in 6 months, significant for time frame, however noted pt with history of CHF.  Labs and medications reviewed.   Diet Order:   Diet Order            Diet Heart Room service appropriate? Yes; Fluid consistency: Thin  Diet effective now                 EDUCATION NEEDS:   Not appropriate for education at this time  Skin:  Skin Assessment: Skin Integrity Issues: Skin Integrity Issues:: Diabetic Ulcer Diabetic Ulcer: R foot  Last BM:  12/17  Height:   Ht Readings from Last 1 Encounters:  06/05/20 5\' 8"  (1.727 m)    Weight:   Wt Readings from Last 1 Encounters:   06/09/20 63.4 kg    BMI:  Body mass index is 21.25 kg/m.  Estimated Nutritional Needs:   Kcal:  1900-2100  Protein:  95-105 grams  Fluid:  >/= 1.9 L/day  Corrin Parker, MS, RD, LDN RD pager number/after hours weekend pager number on Amion.

## 2020-06-10 DIAGNOSIS — R6881 Early satiety: Secondary | ICD-10-CM

## 2020-06-10 DIAGNOSIS — I110 Hypertensive heart disease with heart failure: Secondary | ICD-10-CM

## 2020-06-10 LAB — CBC
HCT: 26.7 % — ABNORMAL LOW (ref 39.0–52.0)
Hemoglobin: 9.1 g/dL — ABNORMAL LOW (ref 13.0–17.0)
MCH: 30 pg (ref 26.0–34.0)
MCHC: 34.1 g/dL (ref 30.0–36.0)
MCV: 88.1 fL (ref 80.0–100.0)
Platelets: 222 K/uL (ref 150–400)
RBC: 3.03 MIL/uL — ABNORMAL LOW (ref 4.22–5.81)
RDW: 13.9 % (ref 11.5–15.5)
WBC: 7.5 K/uL (ref 4.0–10.5)
nRBC: 0 % (ref 0.0–0.2)

## 2020-06-10 LAB — GLUCOSE, CAPILLARY
Glucose-Capillary: 108 mg/dL — ABNORMAL HIGH (ref 70–99)
Glucose-Capillary: 161 mg/dL — ABNORMAL HIGH (ref 70–99)
Glucose-Capillary: 104 mg/dL — ABNORMAL HIGH (ref 70–99)
Glucose-Capillary: 101 mg/dL — ABNORMAL HIGH (ref 70–99)

## 2020-06-10 LAB — BASIC METABOLIC PANEL
Anion gap: 11 (ref 5–15)
BUN: 12 mg/dL (ref 8–23)
CO2: 21 mmol/L — ABNORMAL LOW (ref 22–32)
Calcium: 8.4 mg/dL — ABNORMAL LOW (ref 8.9–10.3)
Chloride: 101 mmol/L (ref 98–111)
Creatinine, Ser: 1.11 mg/dL (ref 0.61–1.24)
GFR, Estimated: >60 mL/min (ref >60)
Glucose, Bld: 141 mg/dL — ABNORMAL HIGH (ref 70–99)
Potassium: 3.1 mmol/L — ABNORMAL LOW (ref 3.5–5.1)
Sodium: 133 mmol/L — ABNORMAL LOW (ref 135–145)

## 2020-06-10 MED ORDER — POTASSIUM CHLORIDE 20 MEQ PO PACK
40.0000 meq | PACK | Freq: Two times a day (BID) | ORAL | Status: AC
  Administered 2020-06-10 (×2): 40 meq via ORAL
  Filled 2020-06-10 (×2): qty 2

## 2020-06-10 NOTE — Progress Notes (Signed)
Subjective: HD#4 No acute overnight events. Mr. Jason [name] evaluated at bedside this AM. He states he did not throw up today, had 2 bowel movements and feels better than yesterday. Notes, able to keep his food down and feels hydrated.   Objective:  Vital signs in last 24 hours: Vitals:   06/09/20 2252 06/10/20 0100 06/10/20 0521 06/10/20 1228  BP: 122/85  130/88 (!) 105/51  Pulse: 72  88 70  Resp: 16  16 16   Temp: 98.5 F (36.9 C)  98.4 F (36.9 C) 97.8 F (36.6 C)  TempSrc: Oral  Oral Oral  SpO2: 98% (!) 7% 98% 98%  Weight:   64.5 kg   Height:       CBC Latest Ref Rng & Units 06/10/2020 06/09/2020 06/08/2020  WBC 4.0 - 10.5 K/uL 7.5 9.6 9.5  Hemoglobin 13.0 - 17.0 g/dL 9.1(L) 9.9(L) 9.3(L)  Hematocrit 39.0 - 52.0 % 26.7(L) 30.6(L) 27.8(L)  Platelets 150 - 400 K/uL 222 254 257   BMP Latest Ref Rng & Units 06/10/2020 06/09/2020 06/08/2020  Glucose 70 - 99 mg/dL 141(H) 88 106(H)  BUN 8 - 23 mg/dL 12 15 17   Creatinine 0.61 - 1.24 mg/dL 1.11 1.10 1.22  BUN/Creat Ratio 10 - 24 - - -  Sodium 135 - 145 mmol/L 133(L) 134(L) 135  Potassium 3.5 - 5.1 mmol/L 3.1(L) 3.4(L) 3.8  Chloride 98 - 111 mmol/L 101 103 103  CO2 22 - 32 mmol/L 21(L) 20(L) 23  Calcium 8.9 - 10.3 mg/dL 8.4(L) 8.5(L) 8.7(L)   Physical Exam Vitals and nursing note reviewed.  Constitutional:      General: He is not in acute distress.    Appearance: He is normal weight.  Pulmonary:     Effort: Pulmonary effort is normal. No respiratory distress.  Abdominal:     General: Bowel sounds are normal. There is no distension.     Palpations: Abdomen is soft.     Tenderness: There is no abdominal tenderness. There is no guarding.  Skin:    General: Skin is warm and dry.  Neurological:     Mental Status: He is alert and oriented to person, place, and time.  Psychiatric:        Mood and Affect: Mood normal.        Behavior: Behavior normal.    Assessment/Plan:  Mr. Jason [name] is a 69 year old male with a history of  anxiety, PAD, CAD s/p CABG 7322 combined systolic and diastolic heart failure, hypothyroidism, diabetes mellitus, pretension, and hyperlipidemia who presented with 1 month history of a dysphagia, N/V/D, noted to have an AKI .   Active Problems:   AKI (acute kidney injury) ([place])   Dehydration   Emesis   Diarrhea   # Nausea/vomiting/diarrhea, early satiety: Concerning for PUD (2/2 to chronic NSAID use) versus gastroparesis versus malignancy (given extensive smoking history). Obtaind stool studies to rule out infectious causes versus malabsorption, still waiting on results. Stool GI panel does not show any growing organisms. CTA results negative for mesenteric ischemia. As per GI holding Plavix for 3-4 days to do Upper endoscopy and colonoscopy.   - 2nd day of Holding Plavix - GI following; appreciate their recommendations - Protonix 40 mg BID - Zofran PRN - Follow up stool studies -Kcl 40 meq BID  # AKI: Improved sCr ~1.11, BUN 12- at baseline.  - Trend creatinine  - Encourage oral intake - Avoid nephrotoxic agents, including NSAIDs  # HTN:  Well controlled.   - Continue home  Metoprolol   # Chronic Normocytic anemia:  Stable.   - Daily CBC  # T2DM # Diabetic Foot Ulcer Latest A1c is 6.5. Foot xray yesterday negative for osteomyelitis or notable soft tissue swelling.   - SSI   # Hypothyroidism: - Continue levothyroxine 50 mcg daily  Prior to Admission Living Arrangement: Home Anticipated Discharge Location: Home Barriers to Discharge: Ongoing medical management Dispo: Anticipated discharge in approximately 3-4 day(s).   Dr. Meredith Staggers [place] Jason [name] Pager: [phone] After 5pm on weekdays and 1pm on weekends: On Call pager [phone]  06/10/2020, 3:12 PM

## 2020-06-11 DIAGNOSIS — Z7902 Long term (current) use of antithrombotics/antiplatelets: Secondary | ICD-10-CM

## 2020-06-11 DIAGNOSIS — R112 Nausea with vomiting, unspecified: Secondary | ICD-10-CM

## 2020-06-11 DIAGNOSIS — R1310 Dysphagia, unspecified: Secondary | ICD-10-CM

## 2020-06-11 DIAGNOSIS — I10 Essential (primary) hypertension: Secondary | ICD-10-CM

## 2020-06-11 DIAGNOSIS — R197 Diarrhea, unspecified: Secondary | ICD-10-CM

## 2020-06-11 LAB — BASIC METABOLIC PANEL
Anion gap: 11 (ref 5–15)
BUN: 9 mg/dL (ref 8–23)
CO2: 20 mmol/L — ABNORMAL LOW (ref 22–32)
Calcium: 8.5 mg/dL — ABNORMAL LOW (ref 8.9–10.3)
Chloride: 101 mmol/L (ref 98–111)
Creatinine, Ser: 1.2 mg/dL (ref 0.61–1.24)
GFR, Estimated: >60 mL/min (ref >60)
Glucose, Bld: 150 mg/dL — ABNORMAL HIGH (ref 70–99)
Potassium: 4.2 mmol/L (ref 3.5–5.1)
Sodium: 132 mmol/L — ABNORMAL LOW (ref 135–145)

## 2020-06-11 LAB — CBC
HCT: 29.6 % — ABNORMAL LOW (ref 39.0–52.0)
Hemoglobin: 9.7 g/dL — ABNORMAL LOW (ref 13.0–17.0)
MCH: 29 pg (ref 26.0–34.0)
MCHC: 32.8 g/dL (ref 30.0–36.0)
MCV: 88.6 fL (ref 80.0–100.0)
Platelets: 199 10*3/uL (ref 150–400)
RBC: 3.34 MIL/uL — ABNORMAL LOW (ref 4.22–5.81)
RDW: 14.1 % (ref 11.5–15.5)
WBC: 6.3 10*3/uL (ref 4.0–10.5)
nRBC: 0 % (ref 0.0–0.2)

## 2020-06-11 LAB — GLUCOSE, CAPILLARY
Glucose-Capillary: 117 mg/dL — ABNORMAL HIGH (ref 70–99)
Glucose-Capillary: 119 mg/dL — ABNORMAL HIGH (ref 70–99)
Glucose-Capillary: 119 mg/dL — ABNORMAL HIGH (ref 70–99)
Glucose-Capillary: 102 mg/dL — ABNORMAL HIGH (ref 70–99)

## 2020-06-11 MED ORDER — METOCLOPRAMIDE HCL 5 MG/ML IJ SOLN
10.0000 mg | Freq: Once | INTRAMUSCULAR | Status: AC
  Administered 2020-06-13: 06:00:00 10 mg via INTRAVENOUS
  Filled 2020-06-11: qty 2

## 2020-06-11 MED ORDER — METOCLOPRAMIDE HCL 5 MG/ML IJ SOLN
10.0000 mg | Freq: Once | INTRAMUSCULAR | Status: AC
  Administered 2020-06-12: 17:00:00 10 mg via INTRAVENOUS
  Filled 2020-06-11: qty 2

## 2020-06-11 MED ORDER — PEG-KCL-NACL-NASULF-NA ASC-C 100 G PO SOLR
0.5000 | Freq: Once | ORAL | Status: AC
  Administered 2020-06-13: 05:00:00 100 g via ORAL

## 2020-06-11 MED ORDER — PEG-KCL-NACL-NASULF-NA ASC-C 100 G PO SOLR
0.5000 | Freq: Once | ORAL | Status: AC
  Administered 2020-06-12: 17:00:00 100 g via ORAL
  Filled 2020-06-11: qty 1

## 2020-06-11 MED ORDER — PEG-KCL-NACL-NASULF-NA ASC-C 100 G PO SOLR
0.5000 | Freq: Once | ORAL | Status: DC
  Filled 2020-06-11: qty 1

## 2020-06-11 MED ORDER — PEG-KCL-NACL-NASULF-NA ASC-C 100 G PO SOLR: 1.0000 | Freq: Once | ORAL | Status: DC

## 2020-06-11 MED ORDER — PEG-KCL-NACL-NASULF-NA ASC-C 100 G PO SOLR
0.5000 | Freq: Once | ORAL | Status: DC
  Filled 2020-06-11 (×2): qty 1

## 2020-06-11 MED ORDER — LOPERAMIDE HCL 2 MG PO CAPS
2.0000 mg | ORAL_CAPSULE | ORAL | Status: DC | PRN
Start: 2020-06-11 — End: 2020-06-11

## 2020-06-11 NOTE — Progress Notes (Signed)
          Daily Rounding Note  06/11/2020, 9:31 AM  LOS: 3 days   SUBJECTIVE:   Chief complaint: Early satiety.  Regurgitation/vomiting.  Loose stool Vomiting is a little bit better.  Tolerating some solid food.  Continues to have loose stools especially after p.o. intake.  OBJECTIVE:         Vital signs in last 24 hours:    Temp:  [97.5 F (36.4 C)-98.4 F (36.9 C)] 98.4 F (36.9 C) (12/20 0530) Pulse Rate:  [65-75] 75 (12/20 0530) Resp:  [16-18] 16 (12/20 0530) BP: (105-132)/(47-56) 132/54 (12/20 0530) SpO2:  [98 %-100 %] 98 % (12/20 0530) Weight:  [64.3 kg] 64.3 kg (12/19 2333) Last BM Date: 06/10/20 Filed Weights   06/09/20 0511 06/10/20 0521 06/10/20 2333  Weight: 63.4 kg 64.5 kg 64.3 kg   General: Looks chronically unwell.  Comfortable, alert Heart: RRR. Chest: No labored breathing or cough Abdomen: Nontender, nondistended.  Active bowel sounds Extremities: Healing femoropopliteal bypass scars on LLE.  Well-healed scars from femoropopliteal on right.  Toe amputations on right.  No edema. Neuro/Psych: Oriented x3.  Alert.  No tremors.  Intake/Output from previous day: No intake/output data recorded.  Intake/Output this shift: No intake/output data recorded.  Lab Results: Recent Labs    06/09/20 0439 06/10/20 0219 06/11/20 0230  WBC 9.6 7.5 6.3  HGB 9.9* 9.1* 9.7*  HCT 30.6* 26.7* 29.6*  PLT 254 222 199   BMET Recent Labs    06/09/20 0439 06/10/20 0219 06/11/20 0230  NA 134* 133* 132*  K 3.4* 3.1* 4.2  CL 103 101 101  CO2 20* 21* 20*  GLUCOSE 88 141* 150*  BUN 15 12 9   CREATININE 1.10 1.11 1.20  CALCIUM 8.5* 8.4* 8.5*   LFT No results for input(s): PROT, ALBUMIN, AST, ALT, ALKPHOS, BILITOT, BILIDIR, IBILI in the last 72 hours. PT/INR No results for input(s): LABPROT, INR in the last 72 hours. Hepatitis Panel No results for input(s): HEPBSAG, HCVAB, HEPAIGM, HEPBIGM in the last 72  hours.  Studies/Results: No results found.   IMPRESSION:   *   Dysphagia.  Weight loss.  Abd pain. Early satiety.  Nausea/vomiting.  Malnutrition. Cachexia.  Dark stools.  800 mg ibuprofen daily.  No PPI at home CTAP with angio:  Aortoiliac atherosclerosis without significant flow-limiting stenosis.  No mesenteric ischemia.  Liver, GB, pancreas, stomach, bowel unremarkable. Stool pathogen PCR study negative Sending stool studies include fecal fat qualitative, fecal calprotectin, ova and parasite.  *   Chronic Plavix/DAPT. Last dose 12/17.  ASPVD. Carotid artery dz.  S/p R ICA stent.   S/p Bil fem-pop bypass.  S/p CABG 2011.  S/p coronary stents.  MI  X 4.   Still smoking!!!  *    Normocytic anemia.  Chronic.  Continues on chronic oral iron  *     Hyponatremia.  *   NIDDM   PLAN:     *   EGD and colonoscopy ~  5 d off Plavix will be 12/22.     Azucena Freed  06/11/2020, 9:31 AM Phone [phone]

## 2020-06-11 NOTE — Progress Notes (Signed)
Patient voices that he has 5 (2 medium and 3 small) loose stool. RN unable to assess since pt flushed all his BMs. Patient denied abd pain or discomfort. Attending MD paged. Awaiting for call back. Will report to oncoming RN to let rounding team know.

## 2020-06-11 NOTE — H&P (View-Only) (Signed)
          Daily Rounding Note  06/11/2020, 9:31 AM  LOS: 3 days   SUBJECTIVE:   Chief complaint: Early satiety.  Regurgitation/vomiting.  Loose stool Vomiting is a little bit better.  Tolerating some solid food.  Continues to have loose stools especially after p.o. intake.  OBJECTIVE:         Vital signs in last 24 hours:    Temp:  [97.5 F (36.4 C)-98.4 F (36.9 C)] 98.4 F (36.9 C) (12/20 0530) Pulse Rate:  [65-75] 75 (12/20 0530) Resp:  [16-18] 16 (12/20 0530) BP: (105-132)/(47-56) 132/54 (12/20 0530) SpO2:  [98 %-100 %] 98 % (12/20 0530) Weight:  [64.3 kg] 64.3 kg (12/19 2333) Last BM Date: 06/10/20 Filed Weights   06/09/20 0511 06/10/20 0521 06/10/20 2333  Weight: 63.4 kg 64.5 kg 64.3 kg   General: Looks chronically unwell.  Comfortable, alert Heart: RRR. Chest: No labored breathing or cough Abdomen: Nontender, nondistended.  Active bowel sounds Extremities: Healing femoropopliteal bypass scars on LLE.  Well-healed scars from femoropopliteal on right.  Toe amputations on right.  No edema. Neuro/Psych: Oriented x3.  Alert.  No tremors.  Intake/Output from previous day: No intake/output data recorded.  Intake/Output this shift: No intake/output data recorded.  Lab Results: Recent Labs    06/09/20 0439 06/10/20 0219 06/11/20 0230  WBC 9.6 7.5 6.3  HGB 9.9* 9.1* 9.7*  HCT 30.6* 26.7* 29.6*  PLT 254 222 199   BMET Recent Labs    06/09/20 0439 06/10/20 0219 06/11/20 0230  NA 134* 133* 132*  K 3.4* 3.1* 4.2  CL 103 101 101  CO2 20* 21* 20*  GLUCOSE 88 141* 150*  BUN 15 12 9   CREATININE 1.10 1.11 1.20  CALCIUM 8.5* 8.4* 8.5*   LFT No results for input(s): PROT, ALBUMIN, AST, ALT, ALKPHOS, BILITOT, BILIDIR, IBILI in the last 72 hours. PT/INR No results for input(s): LABPROT, INR in the last 72 hours. Hepatitis Panel No results for input(s): HEPBSAG, HCVAB, HEPAIGM, HEPBIGM in the last 72  hours.  Studies/Results: No results found.   IMPRESSION:   *   Dysphagia.  Weight loss.  Abd pain. Early satiety.  Nausea/vomiting.  Malnutrition. Cachexia.  Dark stools.  800 mg ibuprofen daily.  No PPI at home CTAP with angio:  Aortoiliac atherosclerosis without significant flow-limiting stenosis.  No mesenteric ischemia.  Liver, GB, pancreas, stomach, bowel unremarkable. Stool pathogen PCR study negative Sending stool studies include fecal fat qualitative, fecal calprotectin, ova and parasite.  *   Chronic Plavix/DAPT. Last dose 12/17.  ASPVD. Carotid artery dz.  S/p R ICA stent.   S/p Bil fem-pop bypass.  S/p CABG 2011.  S/p coronary stents.  MI  X 4.   Still smoking!!!  *    Normocytic anemia.  Chronic.  Continues on chronic oral iron  *     Hyponatremia.  *   NIDDM   PLAN:     *   EGD and colonoscopy ~  5 d off Plavix will be 12/22.     Azucena Freed  06/11/2020, 9:31 AM Phone [phone]

## 2020-06-11 NOTE — Progress Notes (Signed)
Internal Medicine Attending Note:  Patient sitting up out of bed in a chair. Continues to have diarrhea, multiple episodes overnight and this morning. Tolerating some PO but limited due to nausea. Denies emesis.  Physical Exam Constitutional: NAD, appears comfortable Cardiovascular: RRR, no murmurs, rubs, or gallops.  Abdominal: Soft, non tender, non distended. +BS.  Extremities: Warm and well perfused.  No edema.   Patient is a 69 yo M with a pmhx of  HTN, HLD, CAD s/p CABG in 2011, type II DM s/p 2nd toe amputation, PVD s/p right fem pop bypass, and hypothyroidism  here with chronic nausea, vomitting, diarrhea and inability to tolerate PO.    1. N/V/D: Stool studies still pending. Zofran PRN, will add imodium today but plan to d/c tomorrow for his colonoscopy prep. Continue holding plavix. Appreciate GI's assistance.   2. Acute Renal Failure: Pre renal in the setting of N/V/D and inability to tolerate PO. Resolved with IVFs. Continue to monitor.   3. Chronic Normocytic Anemia: Iron studies this admission look like anemia of chronic disease. However on chart review he has a history of multifactorial anemia 2/2 iron deficiency and vit B12 deficiency. Vitamin B12 levels are low this admission, we have started supplements. Further work up with EGD/Colonoscopy planned 12/22. May need to consider IV vitamin b12 if there is concern for malabsorption.   4. Type II DM: Holding home Janumet. SSI-sensitive TID AC.  5. Hypothyroidism: TSH WNL, continue synthroid 50 mcg.    6. HTN: Home Imdur and metoprolol   7. CAD s/p CABG, PVD: Crestor 20 mg daily, holding plavix   Velna Ochs, MD 06/11/2020, 11:21 AM

## 2020-06-11 NOTE — Progress Notes (Signed)
PT Cancellation Note  Patient Details Name: CAMAURI CRATON MRN: [id number] DOB: [birth year]   Cancelled Treatment:    Reason Eval/Treat Not Completed: Other (comment) Pt declined therapy earlier today due to not feeling well/"had a bad night."  Will f/u as able. Abran Richard, PT Acute Rehab Services Pager [phone] [facility] [place] 06/11/2020, 5:14 PM

## 2020-06-11 NOTE — Progress Notes (Signed)
OT Cancellation Note  Patient Details Name: Jason [name] MRN: [id number] DOB: 01-22-1951   Cancelled Treatment:    Reason Eval/Treat Not Completed: Other (comment) (Pt reports that he can dress himself and walk to the bathroom without assist. Pt reports having a bad night of diarrhea, but also wants to be discharged from OT. OT signing off at this time.) OT eval states no follow-up OT post d/c.  Jefferey Pica, OTR/L Acute Rehabilitation Services Pager: [phone] Office: [phone]   Jefferey Pica 06/11/2020, 5:43 PM

## 2020-06-11 NOTE — Progress Notes (Deleted)
OT Cancellation Note  Patient Details Name: Jason [name] MRN: [id number] DOB: 10/31/1950   Cancelled Treatment:    Reason Eval/Treat Not Completed: Other (comment) (Pt reports that he and his wife are comfortable performing ADL and he does not require continued OT acute skilled services. Pt reports walking to bathroom with spouse and 'it is going well." OT to d/c orders as pt declined further OT visits.)   Jefferey Pica, OTR/L Acute Rehabilitation Services Pager: [phone] Office: [phone]   Kadynce Bonds C 06/11/2020, 5:38 PM

## 2020-06-12 DIAGNOSIS — R42 Dizziness and giddiness: Secondary | ICD-10-CM

## 2020-06-12 LAB — GLUCOSE, CAPILLARY
Glucose-Capillary: 115 mg/dL — ABNORMAL HIGH (ref 70–99)
Glucose-Capillary: 123 mg/dL — ABNORMAL HIGH (ref 70–99)
Glucose-Capillary: 93 mg/dL (ref 70–99)
Glucose-Capillary: 81 mg/dL (ref 70–99)

## 2020-06-12 LAB — FECAL FAT, QUALITATIVE
Fat Qual Neutral, Stl: NORMAL
Fat Qual Total, Stl: NORMAL

## 2020-06-12 LAB — CALPROTECTIN, FECAL: Calprotectin, Fecal: 98 ug/g (ref 0–120)

## 2020-06-12 MED ORDER — SODIUM CHLORIDE 0.9 % IV SOLN
510.0000 mg | Freq: Once | INTRAVENOUS | Status: AC
  Administered 2020-06-12: 14:00:00 510 mg via INTRAVENOUS
  Filled 2020-06-12: qty 17

## 2020-06-12 NOTE — Progress Notes (Addendum)
Subjective: HD#6  No acute overnight events. Jason [name] endorses occasional dizziness / light-headedness upon standing up. Denies nausea, vomiting, abdominal pain, and has not had a bowel movement since yesterday.   Objective:  Vital signs in last 24 hours: Vitals:   06/11/20 2328 06/12/20 0330 06/12/20 0529 06/12/20 1226  BP: (!) 116/59 (!) 120/58 130/68 97/76  Pulse: 83 80 77 73  Resp: 16 16 18 18   Temp: 98.4 F (36.9 C) 98.7 F (37.1 C) 98 F (36.7 C) 97.8 F (36.6 C)  TempSrc: Oral Oral Oral Oral  SpO2: 94% 98% 99% 97%  Weight:  142 lb 3.2 oz (64.5 kg)    Height:       CBC Latest Ref Rng & Units 06/11/2020 06/10/2020 06/09/2020  WBC 4.0 - 10.5 K/uL 6.3 7.5 9.6  Hemoglobin 13.0 - 17.0 g/dL 9.7(L) 9.1(L) 9.9(L)  Hematocrit 39.0 - 52.0 % 29.6(L) 26.7(L) 30.6(L)  Platelets 150 - 400 K/uL 199 222 254   BMP Latest Ref Rng & Units 06/11/2020 06/10/2020 06/09/2020  Glucose 70 - 99 mg/dL 150(H) 141(H) 88  BUN 8 - 23 mg/dL 9 12 15   Creatinine 0.61 - 1.24 mg/dL 1.20 1.11 1.10  BUN/Creat Ratio 10 - 24 - - -  Sodium 135 - 145 mmol/L 132(L) 133(L) 134(L)  Potassium 3.5 - 5.1 mmol/L 4.2 3.1(L) 3.4(L)  Chloride 98 - 111 mmol/L 101 101 103  CO2 22 - 32 mmol/L 20(L) 21(L) 20(L)  Calcium 8.9 - 10.3 mg/dL 8.5(L) 8.4(L) 8.5(L)   Physical Exam Vitals and nursing note reviewed.  Constitutional:      General: He is not in acute distress.    Appearance: He is normal weight.  Pulmonary:     Effort: Pulmonary effort is normal. No respiratory distress.  Abdominal:     General: Bowel sounds are normal. There is no distension.     Palpations: Abdomen is soft.     Tenderness: There is no abdominal tenderness. There is no guarding.  Skin:    General: Skin is warm and dry.  Neurological:     Mental Status: He is alert and oriented to person, place, and time.  Psychiatric:        Mood and Affect: Mood normal.        Behavior: Behavior normal.    Assessment/Plan:  Jason [name] is a 69  year old male with a history of anxiety, PAD, CAD s/p CABG 0960 combined systolic and diastolic heart failure, hypothyroidism, diabetes mellitus, pretension, and hyperlipidemia who presented with 1 month history of a dysphagia, N/V/D, noted to have an AKI .   Active Problems:   AKI (acute kidney injury) ([place])   Dehydration   Emesis   Diarrhea   Dysphagia   Antiplatelet or antithrombotic long-term use   # Nausea/vomiting/diarrhea, early satiety: Concerning for PUD (2/2 to chronic NSAID use) versus gastroparesis versus malignancy (given extensive smoking history). Obtaind stool studies to rule out infectious causes versus malabsorption, still waiting on results. Stool GI panel does not show any growing organisms. Fecal fat is normal. CTA results negative for mesenteric ischemia. As per GI holding Plavix and plan for Upper endoscopy and colonoscopy tomorrow at 1 pm. GI changed diet to clear liquids today and will ne NPO later tonight with bowel prep. Ordered Feraheme infusion, holding oral iron and SQ levonox and will resume tomorrow.    - 4th day of Holding Plavix - GI following; appreciate their recommendations - Protonix 40 mg BID - Zofran PRN -  Follow up stool studies   # AKI: Improved sCr ~1.20, BUN 9- at baseline.  - Trend creatinine  - Encourage oral intake - Avoid nephrotoxic agents, including NSAIDs  # HTN:  Well controlled.   - Continue home Metoprolol   # Chronic Normocytic anemia:   Patient Hb 9.7 today, fluctuating around it. Iron studies this admission look like anemia of chronic disease. Chart review clarifies further that he has a history of multifactorial anemia 2/2 iron deficiency and vit B12 deficiency. Vitamin B12 levels are low this admission, we have started supplements. Further work up with EGD/Colonoscopy planned tomorrow. May need to consider IV vitamin b12 if there is concern for malabsorption.  -- Daily CBC -- Vitamin B12 1000 mcg daily -- Hold Iron per  surgery -- Feraheme 510 mg IV once  # T2DM # Diabetic Foot Ulcer Latest A1c is 6.5. Foot xray yesterday negative for osteomyelitis or notable soft tissue swelling.   - SSI   # Hypothyroidism: - Continue levothyroxine 50 mcg daily  Prior to Admission Living Arrangement: Home Anticipated Discharge Location: Home Barriers to Discharge: Ongoing medical management Dispo: Anticipated discharge in approximately 1-2 day(s).   Dr. Meredith Staggers Gaberiel Youngblood Pager: [phone] After 5pm on weekdays and 1pm on weekends: On Call pager [phone]  06/12/2020, 3:33 PM

## 2020-06-12 NOTE — Progress Notes (Signed)
Physical Therapy Treatment Patient Details Name: Jason [name] MRN: [id number] DOB: 08/14/1950 Today's Date: 06/12/2020    History of Present Illness Pt is a 69 y/o male admitted secondary to AKI and nausea/vomiting. PMH includes CAD s/p CABG, tobacco use, COPD, DM, and HTN.    PT Comments    Patient received in bed, very pleasant and cooperative with PT, feeling much better today. Tolerated significant increase in gait distance but did need one sitting rest break due to back and R foot pain. Gait with wide BOS and mildly unsteady, however able to maintain balance without assistance, does still tend to reach for rails and door frames when they are available and would benefit from use of assistive device. Declines up to recliner as it increases his back pain, but was positioned to comfort with bed in chair position at EOS and all needs otherwise met.     Follow Up Recommendations  Outpatient PT     Equipment Recommendations  Other (comment) (rollator)    Recommendations for Other Services       Precautions / Restrictions Precautions Precautions: Fall Restrictions Weight Bearing Restrictions: No    Mobility  Bed Mobility Overal bed mobility: Needs Assistance Bed Mobility: Supine to Sit;Sit to Supine     Supine to sit: Modified independent (Device/Increase time);HOB elevated Sit to supine: Modified independent (Device/Increase time);HOB elevated   General bed mobility comments: no physical assist given  Transfers Overall transfer level: Needs assistance Equipment used: None Transfers: Sit to/from Stand Sit to Stand: Supervision         General transfer comment: S for safety with wide BOS and increased effort, mildly unsteady at first and needed to lean on bed with back of legs to gain balance  Ambulation/Gait Ambulation/Gait assistance: Min guard Gait Distance (Feet): 100 Feet (43fx2) Assistive device: None Gait Pattern/deviations: Step-through  pattern;Decreased stride length;Decreased step length - left;Decreased step length - right;Wide base of support Gait velocity: Decreased   General Gait Details: wide BOS, slow and mildly unsteady but able to maintain balance without assistive device or external assist ; needed one sitting rest break due to back and R foot pain   Stairs             Wheelchair Mobility    Modified Rankin (Stroke Patients Only)       Balance Overall balance assessment: Needs assistance Sitting-balance support: No upper extremity supported;Feet supported Sitting balance-Leahy Scale: Normal     Standing balance support: No upper extremity supported;During functional activity Standing balance-Leahy Scale: Fair Standing balance comment: tends to rely on UE support when it's availalbe                            Cognition Arousal/Alertness: Awake/alert Behavior During Therapy: WFL for tasks assessed/performed Overall Cognitive Status: Within Functional Limits for tasks assessed                                        Exercises      General Comments        Pertinent Vitals/Pain Pain Assessment: Faces Faces Pain Scale: Hurts little more Pain Location: back Pain Descriptors / Indicators: Aching;Discomfort Pain Intervention(s): Limited activity within patient's tolerance;Monitored during session;Repositioned    Home Living  Prior Function            PT Goals (current goals can now be found in the care plan section) Acute Rehab PT Goals Patient Stated Goal: to go home PT Goal Formulation: With patient Time For Goal Achievement: 06/21/20 Potential to Achieve Goals: Good Progress towards PT goals: Progressing toward goals    Frequency    Min 3X/week      PT Plan Current plan remains appropriate    Co-evaluation              AM-PAC PT "6 Clicks" Mobility   Outcome Measure  Help needed turning from your back  to your side while in a flat bed without using bedrails?: None Help needed moving from lying on your back to sitting on the side of a flat bed without using bedrails?: None Help needed moving to and from a bed to a chair (including a wheelchair)?: A Little Help needed standing up from a chair using your arms (e.g., wheelchair or bedside chair)?: A Little Help needed to walk in hospital room?: A Little Help needed climbing 3-5 steps with a railing? : A Little 6 Click Score: 20    End of Session Equipment Utilized During Treatment: Gait belt Activity Tolerance: Patient tolerated treatment well Patient left: in bed;with call bell/phone within reach (bed in chair position) Nurse Communication: Mobility status PT Visit Diagnosis: Other abnormalities of gait and mobility (R26.89);Pain Pain - part of body:  (back)     Time: 1010-1026 PT Time Calculation (min) (ACUTE ONLY): 16 min  Charges:  $Gait Training: 8-22 mins                     Windell Norfolk, DPT, PN1   Supplemental Physical Therapist Branch    Pager [phone] Acute Rehab Office [phone]

## 2020-06-12 NOTE — Progress Notes (Signed)
Colonoscopy, egd set for 1 PM tomorrow 12/22.  See prep and diet orders.   Assessment of loose stools, regurgitation/vomiting early satiety, normocytic anemia w Iron deficiency.  Will order Feraheme infusion.  Holding oral iron and SQ Lovenox and will resume these after colonoscopy/EGD Discussed w pt yesterday and again today.  He is agreeable No BM's yet today.    Azucena Freed PA-C

## 2020-06-13 ENCOUNTER — Other Ambulatory Visit: Payer: Self-pay

## 2020-06-13 ENCOUNTER — Inpatient Hospital Stay (HOSPITAL_COMMUNITY): Payer: Medicare Other | Admitting: Anesthesiology

## 2020-06-13 ENCOUNTER — Encounter (HOSPITAL_COMMUNITY)
Admission: EM | Disposition: A | Discharge status: Home / Self Care | Payer: Self-pay | Source: Home / Self Care | Attending: Internal Medicine

## 2020-06-13 ENCOUNTER — Encounter (HOSPITAL_COMMUNITY): Payer: Self-pay | Admitting: Internal Medicine

## 2020-06-13 DIAGNOSIS — D123 Benign neoplasm of transverse colon: Secondary | ICD-10-CM

## 2020-06-13 DIAGNOSIS — D122 Benign neoplasm of ascending colon: Secondary | ICD-10-CM

## 2020-06-13 DIAGNOSIS — K635 Polyp of colon: Secondary | ICD-10-CM

## 2020-06-13 DIAGNOSIS — K222 Esophageal obstruction: Secondary | ICD-10-CM

## 2020-06-13 DIAGNOSIS — E440 Moderate protein-calorie malnutrition: Secondary | ICD-10-CM | POA: Insufficient documentation

## 2020-06-13 DIAGNOSIS — D125 Benign neoplasm of sigmoid colon: Secondary | ICD-10-CM

## 2020-06-13 HISTORY — PX: BIOPSY: SHX5522

## 2020-06-13 HISTORY — PX: BALLOON DILATION: SHX5330

## 2020-06-13 HISTORY — PX: POLYPECTOMY: SHX5525

## 2020-06-13 HISTORY — PX: ESOPHAGOGASTRODUODENOSCOPY (EGD) WITH PROPOFOL: SHX5813

## 2020-06-13 HISTORY — PX: COLONOSCOPY WITH PROPOFOL: SHX5780

## 2020-06-13 LAB — CBC
HCT: 36 % — ABNORMAL LOW (ref 39.0–52.0)
Hemoglobin: 11.4 g/dL — ABNORMAL LOW (ref 13.0–17.0)
MCH: 28.7 pg (ref 26.0–34.0)
MCHC: 31.7 g/dL (ref 30.0–36.0)
MCV: 90.7 fL (ref 80.0–100.0)
Platelets: 244 10*3/uL (ref 150–400)
RBC: 3.97 MIL/uL — ABNORMAL LOW (ref 4.22–5.81)
RDW: 14.5 % (ref 11.5–15.5)
WBC: 8.4 10*3/uL (ref 4.0–10.5)
nRBC: 0 % (ref 0.0–0.2)

## 2020-06-13 LAB — BASIC METABOLIC PANEL
Anion gap: 12 (ref 5–15)
BUN: 10 mg/dL (ref 8–23)
CO2: 24 mmol/L (ref 22–32)
Calcium: 9.3 mg/dL (ref 8.9–10.3)
Chloride: 99 mmol/L (ref 98–111)
Creatinine, Ser: 1.29 mg/dL — ABNORMAL HIGH (ref 0.61–1.24)
GFR, Estimated: >60 mL/min (ref >60)
Glucose, Bld: 102 mg/dL — ABNORMAL HIGH (ref 70–99)
Potassium: 3.8 mmol/L (ref 3.5–5.1)
Sodium: 135 mmol/L (ref 135–145)

## 2020-06-13 LAB — GLUCOSE, CAPILLARY
Glucose-Capillary: 91 mg/dL (ref 70–99)
Glucose-Capillary: 103 mg/dL — ABNORMAL HIGH (ref 70–99)
Glucose-Capillary: 102 mg/dL — ABNORMAL HIGH (ref 70–99)
Glucose-Capillary: 126 mg/dL — ABNORMAL HIGH (ref 70–99)

## 2020-06-13 LAB — OVA + PARASITE EXAM

## 2020-06-13 SURGERY — ESOPHAGOGASTRODUODENOSCOPY (EGD) WITH PROPOFOL
Anesthesia: Monitor Anesthesia Care

## 2020-06-13 MED ORDER — VASOPRESSIN 20 UNIT/ML IV SOLN
INTRAVENOUS | Status: DC | PRN
  Administered 2020-06-13 (×3): 1 [IU] via INTRAVENOUS
  Administered 2020-06-13: 2 [IU] via INTRAVENOUS

## 2020-06-13 MED ORDER — LACTATED RINGERS IV SOLN: Freq: Once | INTRAVENOUS | Status: AC

## 2020-06-13 MED ORDER — PROPOFOL 500 MG/50ML IV EMUL
INTRAVENOUS | Status: DC | PRN
  Administered 2020-06-13: 150 ug/kg/min via INTRAVENOUS

## 2020-06-13 MED ORDER — ADULT MULTIVITAMIN W/MINERALS CH
1.0000 | ORAL_TABLET | Freq: Every day | ORAL | Status: DC
  Administered 2020-06-14: 09:00:00 1 via ORAL
  Filled 2020-06-13: qty 1

## 2020-06-13 MED ORDER — PHENYLEPHRINE 40 MCG/ML (10ML) SYRINGE FOR IV PUSH (FOR BLOOD PRESSURE SUPPORT)
PREFILLED_SYRINGE | INTRAVENOUS | Status: DC | PRN
  Administered 2020-06-13: 80 ug via INTRAVENOUS
  Administered 2020-06-13: 120 ug via INTRAVENOUS
  Administered 2020-06-13: 200 ug via INTRAVENOUS
  Administered 2020-06-13: 160 ug via INTRAVENOUS

## 2020-06-13 MED ORDER — EPHEDRINE SULFATE-NACL 50-0.9 MG/10ML-% IV SOSY
PREFILLED_SYRINGE | INTRAVENOUS | Status: DC | PRN
  Administered 2020-06-13: 10 mg via INTRAVENOUS
  Administered 2020-06-13: 15 mg via INTRAVENOUS
  Administered 2020-06-13: 10 mg via INTRAVENOUS

## 2020-06-13 MED ORDER — PROSOURCE PLUS PO LIQD
30.0000 mL | Freq: Two times a day (BID) | ORAL | Status: DC
  Administered 2020-06-14 (×2): 30 mL via ORAL
  Filled 2020-06-13 (×2): qty 30

## 2020-06-13 MED ORDER — SODIUM CHLORIDE 0.9 % IV SOLN: INTRAVENOUS | Status: DC | PRN

## 2020-06-13 MED ORDER — PROPOFOL 10 MG/ML IV BOLUS
INTRAVENOUS | Status: DC | PRN
  Administered 2020-06-13: 25 mg via INTRAVENOUS

## 2020-06-13 MED ORDER — CLOPIDOGREL BISULFATE 75 MG PO TABS
75.0000 mg | ORAL_TABLET | Freq: Every day | ORAL | Status: DC
  Administered 2020-06-14: 09:00:00 75 mg via ORAL
  Filled 2020-06-13: qty 1

## 2020-06-13 MED ORDER — ALBUMIN HUMAN 5 % IV SOLN: INTRAVENOUS | Status: DC | PRN

## 2020-06-13 MED ORDER — LIDOCAINE 2% (20 MG/ML) 5 ML SYRINGE
INTRAMUSCULAR | Status: DC | PRN
  Administered 2020-06-13: 60 mg via INTRAVENOUS

## 2020-06-13 SURGICAL SUPPLY — 25 items

## 2020-06-13 NOTE — Transfer of Care (Signed)
Immediate Anesthesia Transfer of Care Note  Patient: Jason [name]  Procedure(s) Performed: ESOPHAGOGASTRODUODENOSCOPY (EGD) WITH PROPOFOL (N/A ) COLONOSCOPY WITH PROPOFOL (N/A ) BIOPSY BALLOON DILATION (N/A )  Patient Location: Endoscopy Unit  Anesthesia Type:MAC  Level of Consciousness: drowsy  Airway & Oxygen Therapy: Patient Spontanous Breathing  Post-op Assessment: Report given to RN and Post -op Vital signs reviewed and stable  Post vital signs: Reviewed and stable  Last Vitals:  Vitals Value Taken Time  BP 97/33 06/13/20 1430  Temp    Pulse 54 06/13/20 1430  Resp 24 06/13/20 1430  SpO2 100 % 06/13/20 1430  Vitals shown include unvalidated device data.  Last Pain:  Vitals:   06/13/20 1227  TempSrc: Temporal  PainSc: 0-No pain         Complications: No complications documented.

## 2020-06-13 NOTE — Anesthesia Preprocedure Evaluation (Addendum)
Anesthesia Evaluation  Patient identified by MRN, date of birth, ID band Patient awake    Reviewed: Allergy & Precautions, NPO status , Patient's Chart, lab work & pertinent test results  Airway Mallampati: I  TM Distance: >3 FB Neck ROM: Full    Dental  (+) Poor Dentition, Missing, Dental Advisory Given,    Pulmonary COPD, Current Smoker and Patient abstained from smoking.,    Pulmonary exam normal breath sounds clear to auscultation       Cardiovascular hypertension, + angina + CAD, + Past MI, + Cardiac Stents (2018), + CABG (2011), + Peripheral Vascular Disease (s/p right femoropopliteral bypass, R ICA stent) and +CHF  Normal cardiovascular exam Rhythm:Regular Rate:Normal  Known RBBB with LAFB  TTE 2018 - Left ventricle: The cavity size was normal. There was mild focal basal hypertrophy of the septum. Systolic function was normal. The estimated ejection fraction was in the range of 60% to 65%. Wall motion was normal; there were no regional wall motion abnormalities. There was an increased relative contribution ofatrial contraction to ventricular filling. Doppler parameters are consistent with abnormal left ventricular relaxation (grade 1diastolic dysfunction).  - Aortic valve: Severe focal calcification involving the left  coronary and noncoronary cusp. Noncoronary cusp mobility was severely restricted.  - Mitral valve: Calcified annulus.  - Atrial septum: There was increased thickness of the septum, consistent with lipomatous hypertrophy.  - Pulmonary arteries: Systolic pressure could not be accurately estimated.   LHC 2018  Prox RCA to Mid RCA lesion, 100 %stenosed.  Ost LAD to Dist LAD lesion, 75 %stenosed.  1st Diag lesion, 50 %stenosed.  Ost 1st Mrg to 1st Mrg lesion, 100 %stenosed.  Ost Cx to Prox Cx lesion, 99 %stenosed.  Ost 2nd Mrg to 2nd Mrg lesion, 100 %stenosed.  SVG.  Origin lesion, 100  %stenosed.  Seq SVG-.  LIMA graft was visualized by angiography and is normal in caliber and anatomically normal.  LV end diastolic pressure is moderately elevated.  A STENT RESOLUTE ONYX [id number] drug eluting stent was successfully placed.  Dist Graft to Insertion lesion, 95 %stenosed.  Post intervention, there is a 0% residual stenosis.   1. Severe 3 vessel obstructive CAD. 2. Patent LIMA to the LAD 3. Patent SVG sequentially to OM1 and OM2 but with critical thrombotic lesion in the body of the SVG 4. Occluded SVG to the RCA- chronic 5. Moderately elevated LVEDP 6. Successful stenting of SVG to OM 2. Procedure complicated by no reflow phenomena and occlusion of the first OM.     Neuro/Psych PSYCHIATRIC DISORDERS Anxiety Depression CVA    GI/Hepatic Neg liver ROS, GERD  Controlled and Medicated,  Endo/Other  diabetes, Oral Hypoglycemic AgentsHypothyroidism   Renal/GU Renal disease (Cr 1.29, K 3.8)  negative genitourinary   Musculoskeletal  (+) Arthritis ,   Abdominal   Peds  Hematology  (+) Blood dyscrasia (on plavix), ,   Anesthesia Other Findings EGD for N/V, early satiety  Reproductive/Obstetrics                            Anesthesia Physical Anesthesia Plan  ASA: III  Anesthesia Plan: MAC   Post-op Pain Management:    Induction: Intravenous  PONV Risk Score and Plan: Propofol infusion and Treatment may vary due to age or medical condition  Airway Management Planned: Natural Airway  Additional Equipment:   Intra-op Plan:   Post-operative Plan:   Informed Consent: I have reviewed the patients History  and Physical, chart, labs and discussed the procedure including the risks, benefits and alternatives for the proposed anesthesia with the patient or authorized representative who has indicated his/her understanding and acceptance.     Dental advisory given  Plan Discussed with: CRNA  Anesthesia Plan Comments:          Anesthesia Quick Evaluation

## 2020-06-13 NOTE — Interval H&P Note (Signed)
History and Physical Interval Note:  06/13/2020 12:59 PM  Jason [name]  has presented today for surgery, with the diagnosis of weight loss, anorexia, early satiety, diarrhea, anemia..  The various methods of treatment have been discussed with the patient and family. After consideration of risks, benefits and other options for treatment, the patient has consented to  Procedure(s): ESOPHAGOGASTRODUODENOSCOPY (EGD) WITH PROPOFOL (N/A) COLONOSCOPY WITH PROPOFOL (N/A) as a surgical intervention.  The patient's history has been reviewed, patient examined, no change in status, stable for surgery.  I have reviewed the patient's chart and labs.  Questions were answered to the patient's satisfaction.     Silvano Rusk

## 2020-06-13 NOTE — Anesthesia Procedure Notes (Signed)
Procedure Name: MAC Date/Time: 06/13/2020 1:34 PM Performed by: Imagene Riches, CRNA Pre-anesthesia Checklist: Patient identified, Emergency Drugs available, Suction available, Patient being monitored and Timeout performed Patient Re-evaluated:Patient Re-evaluated prior to induction Oxygen Delivery Method: Nasal cannula

## 2020-06-13 NOTE — Progress Notes (Signed)
Nutrition Follow-up  DOCUMENTATION CODES:   Non-severe (moderate) malnutrition in context of chronic illness  INTERVENTION:   Once diet is advanced, add:   -D/c Ensure Enlive po TID, each supplement provides 350 kcal and 20 grams of protein -Magic cup TID with meals, each supplement provides 290 kcal and 9 grams of protein -MVI with minerals daily -30 ml Prosource Plus BID, each supplement provides 100 kcals and 15 grams protein   NUTRITION DIAGNOSIS:   Moderate Malnutrition related to chronic illness (CHF) as evidenced by mild fat depletion,moderate fat depletion,mild muscle depletion,moderate muscle depletion,energy intake < 75% for > or equal to 1 month,percent weight loss.  Ongoing  GOAL:   Patient will meet greater than or equal to 90% of their needs  Progressing   MONITOR:   PO intake,Supplement acceptance,Diet advancement,Labs,Weight trends,Skin,I & O's  REASON FOR ASSESSMENT:   Malnutrition Screening Tool    ASSESSMENT:   69 year old male with a history of anxiety, PAD, CAD s/p CABG 6734 combined systolic and diastolic heart failure, hypothyroidism, diabetes mellitus, pretension, and hyperlipidemia who presented with 1 month history of a dysphagia, N/V/D, noted to have an AKI  Reviewed I/O's: +117 ml x 24 hours and +3.1 L since admission  Reviewed MD notes; plan for EGD and colonoscopy today. Pt currently NPO in preparation for procedure.   Spoke with pt at bedside, who reports decreased oral intake over the past month secondary to nausea, vomiting, and diarrhea. Pt shares that his intake has been very inconsistent over the past two months; on "good days" he would be able to consume about two meals per day, however, on "bad days" food would be unappealing to him or "just come right back up if I tired to eat".   Pt reports nausea, vomiting, and diarrhea have improved since admission, but thinks this is likely due to poor oral intake. He estimates he is consuming  about 40% of meals at best. Noted documented meal completion 55-60%. Pt had one Ensure supplement earlier this admission, but does not like the flavor. He reports he has been consuming mostly fresh fruit and orange sherbet.   Per pt, UBW is around 160#, which he last weighed about 2 months ago. He endorses a 20# weight loss over the past two months. Noted pt has experienced a 11.4% wt loss over the past 6 months, which is significant for time frame.   Discussed with pt importance of good meal and supplement intake to promote healing. Reviewed available supplements on the formulary; pt amenable to [place].   Medications reviewed and include ferrous sulfate and vitamin B-12.   Labs reviewed: CBGS: 81-123 (inpatient orders for glycemic control are 0-9 units insulin aspart TID with meals).   NUTRITION - FOCUSED PHYSICAL EXAM:  Flowsheet Row Most Recent Value  Orbital Region Mild depletion  Upper Arm Region Mild depletion  Thoracic and Lumbar Region Moderate depletion  Buccal Region Mild depletion  Temple Region Moderate depletion  Clavicle Bone Region Mild depletion  Clavicle and Acromion Bone Region Mild depletion  Scapular Bone Region Mild depletion  Dorsal Hand Moderate depletion  Patellar Region Mild depletion  Anterior Thigh Region Mild depletion  Posterior Calf Region Mild depletion  Edema (RD Assessment) Mild  Hair Reviewed  Eyes Reviewed  Mouth Reviewed  Skin Reviewed  Nails Reviewed       Diet Order:   Diet Order            Diet NPO time specified  Diet effective ____                 EDUCATION NEEDS:   Education needs have been addressed  Skin:  Skin Assessment: Skin Integrity Issues: Skin Integrity Issues:: Diabetic Ulcer Diabetic Ulcer: R foot  Last BM:  06/11/20  Height:   Ht Readings from Last 1 Encounters:  06/05/20 5\' 8"  (1.727 m)    Weight:   Wt Readings from Last 1 Encounters:  06/13/20 63.5 kg   BMI:  Body mass index is  21.27 kg/m.  Estimated Nutritional Needs:   Kcal:  1900-2100  Protein:  95-105 grams  Fluid:  >/= 1.9 L/day    Loistine Chance, RD, LDN, CDCES Registered Dietitian II Certified Diabetes Care and Education Specialist Please refer to [facility] for RD and/or RD on-call/weekend/after hours pager

## 2020-06-13 NOTE — Op Note (Addendum)
[name] Patient Name: Jason [name] Procedure Date : 06/13/2020 MRN: [id number] Attending MD: Gatha Mayer , MD Date of Birth: [birth year] CSN: [id number] Age: 69 Admit Type: Inpatient Procedure:                Colonoscopy Indications:              Clinically significant diarrhea of unexplained                            origin Providers:                Gatha Mayer, MD, Wynonia Sours, RN, Tyna Jaksch Technician Referring MD:              Medicines:                Propofol per Anesthesia, Monitored Anesthesia Care Complications:            No immediate complications. Estimated Blood Loss:     Estimated blood loss was minimal. Procedure:                Pre-Anesthesia Assessment:                           - Prior to the procedure, a History and Physical                            was performed, and patient medications and                            allergies were reviewed. The patient's tolerance of                            previous anesthesia was also reviewed. The risks                            and benefits of the procedure and the sedation                            options and risks were discussed with the patient.                            All questions were answered, and informed consent                            was obtained. Prior Anticoagulants: The patient                            last took Plavix (clopidogrel) 5 days prior to the                            procedure. ASA Grade Assessment: III - A patient  with severe systemic disease. After reviewing the                            risks and benefits, the patient was deemed in                            satisfactory condition to undergo the procedure.                           - Prior to the procedure, a History and Physical                            was performed, and patient medications and                            allergies were  reviewed. The patient's tolerance of                            previous anesthesia was also reviewed. The risks                            and benefits of the procedure and the sedation                            options and risks were discussed with the patient.                            All questions were answered, and informed consent                            was obtained. Prior Anticoagulants: The patient                            last took Plavix (clopidogrel) 5 days prior to the                            procedure. ASA Grade Assessment: III - A patient                            with severe systemic disease. After reviewing the                            risks and benefits, the patient was deemed in                            satisfactory condition to undergo the procedure.                           After obtaining informed consent, the colonoscope                            was passed under direct vision. Throughout the  procedure, the patient's blood pressure, pulse, and                            oxygen saturations were monitored continuously. The                            CF-[id number] [id number]) Olympus colonoscope was                            introduced through the anus and advanced to the the                            terminal ileum, with identification of the                            appendiceal orifice and IC valve. The colonoscopy                            was somewhat difficult due to significant looping.                            Successful completion of the procedure was aided by                            applying abdominal pressure. The patient tolerated                            the procedure well. The quality of the bowel                            preparation was good. The bowel preparation used                            was MoviPrep via split dose instruction. The                            terminal ileum, ileocecal valve,  appendiceal                            orifice, and rectum were photographed. The                            [id number]) Olympus colonoscope was                            introduced through the and advanced to the. Scope In: 1:52:31 PM Scope Out: 2:22:37 PM Scope Withdrawal Time: 0 hours 21 minutes 57 seconds  Total Procedure Duration: 0 hours 30 minutes 6 seconds  Findings:      The perianal exam findings include erythema.      Ten sessile polyps were found in the sigmoid colon, transverse colon and       ascending colon. The polyps were diminutive in size. These polyps were       removed with a cold snare. Resection and retrieval  were complete.       Verification of patient identification for the specimen was done.       Estimated blood loss was minimal.      Normal mucosa was found in the entire colon. Biopsies for histology were       taken with a cold forceps from the right colon and left colon for       evaluation of microscopic colitis. Verification of patient       identification for the specimen was done. Estimated blood loss was       minimal.      Internal hemorrhoids were found.      The exam was otherwise without abnormality on direct and retroflexion       views. Impression:               - Erythema found on perianal exam.                           - Ten diminutive polyps in the sigmoid colon, in                            the transverse colon and in the ascending colon,                            removed with a cold snare. Resected and retrieved.                           - Normal mucosa in the entire examined colon.                            Biopsied.                           - Internal hemorrhoids.                           - The examination was otherwise normal on direct                            and retroflexion views. Recommendation:           - Patient has a contact number available for                            emergencies. The signs and symptoms of  potential                            delayed complications were discussed with the                            patient. Return to normal activities tomorrow.                            Written discharge instructions were provided to the                            patient.                           -  Cardiac diet.                           - Continue present medications.                           - Await pathology results.                           - No recommendation at this time regarding repeat                            colonoscopy due to comorbidities/lifeexpectancy.                           - consider sertraline as a cause of diarrhea                           - Resume Plavix (clopidogrel) at prior dose                            tomorrow.                           - ok to dc per GI - I will f/u pathology and we                            will contact the patient re: any f/u - we will                            decide and schedule any appointments                           - I cxalled and updated brother Margarito LinerHoward Mcnicholas Procedure Code(s):        --- Professional ---                           [phone], Colonoscopy, flexible; with removal of                            tumor(s), polyp(s), or other lesion(s) by snare                            technique                           45380, 59, Colonoscopy, flexible; with biopsy,                            single or multiple Diagnosis Code(s):        --- Professional ---                           K64.8, Other hemorrhoids                           K63.5,  Polyp of colon                           R19.7, Diarrhea, unspecified CPT copyright 2019 American Medical Association. All rights reserved. The codes documented in this report are preliminary and upon coder review may  be revised to meet current compliance requirements. Gatha Mayer, MD 06/13/2020 2:47:08 PM This report has been signed electronically. Number of Addenda: 0

## 2020-06-13 NOTE — Care Management Important Message (Signed)
Important Message  Patient Details  Name: Jason [name] MRN: [id number] Date of Birth: Oct 25, 1950   Medicare Important Message Given:  Yes     Barb Merino Altheimer 06/13/2020, 4:28 PM

## 2020-06-13 NOTE — Progress Notes (Signed)
PT Cancellation Note  Patient Details Name: Jason [name] MRN: [id number] DOB: 01-Feb-1951   Cancelled Treatment:    Reason Eval/Treat Not Completed: Patient at procedure or test/unavailable - at colonoscopy, PT to check back as schedule allows.  Stacie Glaze, PT Acute Rehabilitation Services Pager [phone]  Office [phone]    Louis Matte 06/13/2020, 2:13 PM

## 2020-06-13 NOTE — Anesthesia Postprocedure Evaluation (Signed)
Anesthesia Post Note  Patient: Jason [name]  Procedure(s) Performed: ESOPHAGOGASTRODUODENOSCOPY (EGD) WITH PROPOFOL (N/A ) COLONOSCOPY WITH PROPOFOL (N/A ) BIOPSY BALLOON DILATION (N/A ) POLYPECTOMY     Patient location during evaluation: PACU Anesthesia Type: MAC Level of consciousness: awake and alert Pain management: pain level controlled Vital Signs Assessment: post-procedure vital signs reviewed and stable Respiratory status: spontaneous breathing Cardiovascular status: stable Anesthetic complications: no   No complications documented.  Last Vitals:  Vitals:   06/13/20 1510 06/13/20 1604  BP: (!) 134/48 (!) 119/59  Pulse:  66  Resp: (!) 25 18  Temp:  (!) 36.2 C  SpO2: 99% 95%    Last Pain:  Vitals:   06/13/20 1604  TempSrc: Axillary  PainSc: 0-No pain                 Nolon Nations

## 2020-06-13 NOTE — Progress Notes (Signed)
Subjective: [name]  No acute overnight events. Jason [name] notes diarrhea but had bowel prep last evening. Endorsing mild nausea.   Objective:  Vital signs in last 24 hours: Vitals:   06/12/20 0330 06/13/20 0100 06/13/20 0520 06/13/20 1046  BP:  (!) 123/53 (!) 112/52 (!) 123/56  Pulse:  66 62 66  Resp:  18 18 18   Temp:  97.6 F (36.4 C) 98.2 F (36.8 C) 97.7 F (36.5 C)  TempSrc:  Oral Oral Oral  SpO2:  95% 94% 94%  Weight: 64.5 kg  63.5 kg   Height:       CBC Latest Ref Rng & Units 06/13/2020 06/11/2020 06/10/2020  WBC 4.0 - 10.5 K/uL 8.4 6.3 7.5  Hemoglobin 13.0 - 17.0 g/dL 11.4(L) 9.7(L) 9.1(L)  Hematocrit 39.0 - 52.0 % 36.0(L) 29.6(L) 26.7(L)  Platelets 150 - 400 K/uL 244 199 222    BMP Latest Ref Rng & Units 06/13/2020 06/11/2020 06/10/2020  Glucose 70 - 99 mg/dL 102(H) 150(H) 141(H)  BUN 8 - 23 mg/dL 10 9 12   Creatinine 0.61 - 1.24 mg/dL 1.29(H) 1.20 1.11  BUN/Creat Ratio 10 - 24 - - -  Sodium 135 - 145 mmol/L 135 132(L) 133(L)  Potassium 3.5 - 5.1 mmol/L 3.8 4.2 3.1(L)  Chloride 98 - 111 mmol/L 99 101 101  CO2 22 - 32 mmol/L 24 20(L) 21(L)  Calcium 8.9 - 10.3 mg/dL 9.3 8.5(L) 8.4(L)    Physical Exam Vitals and nursing note reviewed.  Constitutional:      General: He is not in acute distress.    Appearance: He is normal weight.  Pulmonary:     Effort: No respiratory distress.  Abdominal:     General: Bowel sounds are normal. There is no distension.     Palpations: Abdomen is soft.     Tenderness: There is no abdominal tenderness. There is no guarding.  Skin:    General: Skin is warm and dry.  Neurological:     Mental Status: He is alert and oriented to person, place, and time.  Psychiatric:        Mood and Affect: Mood normal.        Behavior: Behavior normal.    Assessment/Plan:  Jason [name] is a 69 year old male with a history of anxiety, PAD, CAD s/p CABG [id number] combined systolic and diastolic heart failure, hypothyroidism, diabetes mellitus,  pretension, and hyperlipidemia who presented with 1 month history of a dysphagia, N/V/D, noted to have an AKI .   Active Problems:   AKI (acute kidney injury) ([place])   Dehydration   Emesis   Diarrhea   Dysphagia   Antiplatelet or antithrombotic long-term use   # Nausea/vomiting/diarrhea, early satiety: Concerning for PUD (2/2 to chronic NSAID use) versus gastroparesis versus malignancy (given extensive smoking history). Obtaind stool studies to rule out infectious causes versus malabsorption, still waiting on results. Stool GI panel does not show any growing organisms. Fecal fat is normal. Stool Calprotectin is 98, normal. CTA results negative for mesenteric ischemia. As per GI holding Plavix and plan for Upper endoscopy and colonoscopy today at 1 pm. Bowel prep last evening and NPO now.  - 5th day of Holding Plavix - GI following; appreciate their recommendations - Protonix 40 mg BID - Zofran PRN - Follow up stool studies   # AKI: Improved At baseline  - Trend creatinine  - Encourage oral intake - Avoid nephrotoxic agents, including NSAIDs  # HTN:  Well controlled.   - Continue home  Metoprolol   # Chronic Normocytic anemia:   Iron studies this admission look like anemia of chronic disease. Chart review clarifies further that he has a history of multifactorial anemia 2/2 iron deficiency and vit B12 deficiency. Vitamin B12 levels are low this admission, we have started supplements. Further work up with EGD/Colonoscopy planned tomorrow. May need to consider IV vitamin b12 if there is concern for malabsorption.  -- Daily CBC -- Vitamin B12 1000 mcg daily -- Hold Iron per surgery -- s/p Feraheme 510 mg IV once  # T2DM # Diabetic Foot Ulcer Latest A1c is 6.5. Foot xray yesterday negative for osteomyelitis or notable soft tissue swelling.   - SSI   # Hypothyroidism: - Continue levothyroxine 50 mcg daily  Prior to Admission Living Arrangement: Home Anticipated Discharge  Location: Home Barriers to Discharge: Ongoing medical management Dispo: Anticipated discharge in approximately 1-2 day(s).   Dr. Meredith Staggers Shahida Schnackenberg Pager: [phone] After 5pm on weekdays and 1pm on weekends: On Call pager [phone]  06/13/2020, 11:20 AM

## 2020-06-13 NOTE — Op Note (Signed)
[facility] Patient Name: Jason [name] Procedure Date : 06/13/2020 MRN: [id number] Attending MD: Gatha Mayer , MD Date of Birth: [birth year] CSN: [id number] Age: 69 Admit Type: Inpatient Procedure:                Upper GI endoscopy Indications:              Dysphagia, Diarrhea Providers:                Gatha Mayer, MD, Wynonia Sours, RN, Tyna Jaksch Technician Referring MD:              Medicines:                Propofol per Anesthesia, Monitored Anesthesia Care Complications:            No immediate complications. Estimated Blood Loss:     Estimated blood loss was minimal. Procedure:                Pre-Anesthesia Assessment:                           - Prior to the procedure, a History and Physical                            was performed, and patient medications and                            allergies were reviewed. The patient's tolerance of                            previous anesthesia was also reviewed. The risks                            and benefits of the procedure and the sedation                            options and risks were discussed with the patient.                            All questions were answered, and informed consent                            was obtained. Prior Anticoagulants: The patient                            last took Plavix (clopidogrel) 5 days prior to the                            procedure. ASA Grade Assessment: III - A patient                            with severe systemic disease. After reviewing the  risks and benefits, the patient was deemed in                            satisfactory condition to undergo the procedure.                           After obtaining informed consent, the endoscope was                            passed under direct vision. Throughout the                            procedure, the patient's blood pressure, pulse, and                             oxygen saturations were monitored continuously. The                            [id number]) Olympus gastroscope was                            introduced through the mouth, and advanced to the                            second part of duodenum. The upper GI endoscopy was                            accomplished without difficulty. The patient                            tolerated the procedure well. Scope In: Scope Out: Findings:      A mild Schatzki ring was found at the gastroesophageal junction. A TTS       dilator was passed through the scope. Dilation with an 18-19-20 mm       balloon dilator was performed to 20 mm. The dilation site was examined       and showed no change. biopsy forceps disruption x 4 next      The gastroesophageal flap valve was visualized endoscopically and       classified as Hill Grade III (minimal fold, loose to endoscope, hiatal       hernia likely).      Normal mucosa was found in the entire examined stomach.      The examined duodenum was normal. Biopsies for histology were taken with       a cold forceps for evaluation of celiac disease. Verification of patient       identification for the specimen was done. Estimated blood loss was       minimal.      The cardia and gastric fundus were otherwise normal on retroflexion.       Stomach was J-shaped Impression:               - Mild Schatzki ring. Dilated.                           - Gastroesophageal flap valve classified as Hill  Grade III (minimal fold, loose to endoscope, hiatal                            hernia likely).                           - Normal mucosa was found in the entire stomach.                           - Normal examined duodenum. Biopsied. Recommendation:           - Return patient to hospital ward for ongoing care.                           - Cardiac diet.                           - Continue present medications.                           - Await  pathology results.                           - See the other procedure note for documentation of                            additional recommendations. Procedure Code(s):        --- Professional ---                           [phone], Esophagogastroduodenoscopy, flexible,                            transoral; with transendoscopic balloon dilation of                            esophagus (less than 30 mm diameter)                           43239, 59, Esophagogastroduodenoscopy, flexible,                            transoral; with biopsy, single or multiple Diagnosis Code(s):        --- Professional ---                           K22.2, Esophageal obstruction                           R13.10, Dysphagia, unspecified                           R19.7, Diarrhea, unspecified CPT copyright 2019 American Medical Association. All rights reserved. The codes documented in this report are preliminary and upon coder review may  be revised to meet current compliance requirements. Gatha Mayer, MD 06/13/2020 2:39:22 PM This report has been signed electronically. Number of Addenda: 0

## 2020-06-14 LAB — CBC
HCT: 29.6 % — ABNORMAL LOW (ref 39.0–52.0)
Hemoglobin: 10 g/dL — ABNORMAL LOW (ref 13.0–17.0)
MCH: 29.8 pg (ref 26.0–34.0)
MCHC: 33.8 g/dL (ref 30.0–36.0)
MCV: 88.1 fL (ref 80.0–100.0)
Platelets: 239 10*3/uL (ref 150–400)
RBC: 3.36 MIL/uL — ABNORMAL LOW (ref 4.22–5.81)
RDW: 14.6 % (ref 11.5–15.5)
WBC: 6.6 10*3/uL (ref 4.0–10.5)
nRBC: 0 % (ref 0.0–0.2)

## 2020-06-14 LAB — BASIC METABOLIC PANEL
Anion gap: 11 (ref 5–15)
BUN: 8 mg/dL (ref 8–23)
CO2: 22 mmol/L (ref 22–32)
Calcium: 8.9 mg/dL (ref 8.9–10.3)
Chloride: 98 mmol/L (ref 98–111)
Creatinine, Ser: 1.23 mg/dL (ref 0.61–1.24)
GFR, Estimated: >60 mL/min (ref >60)
Glucose, Bld: 123 mg/dL — ABNORMAL HIGH (ref 70–99)
Potassium: 3.6 mmol/L (ref 3.5–5.1)
Sodium: 131 mmol/L — ABNORMAL LOW (ref 135–145)

## 2020-06-14 LAB — GLUCOSE, CAPILLARY
Glucose-Capillary: 108 mg/dL — ABNORMAL HIGH (ref 70–99)
Glucose-Capillary: 138 mg/dL — ABNORMAL HIGH (ref 70–99)

## 2020-06-14 MED ORDER — PANTOPRAZOLE SODIUM 40 MG PO TBEC: 40.0000 mg | DELAYED_RELEASE_TABLET | Freq: Two times a day (BID) | ORAL | 0 refills | Status: DC

## 2020-06-14 MED ORDER — NICOTINE 21 MG/24HR TD PT24: 21.0000 mg | MEDICATED_PATCH | Freq: Every day | TRANSDERMAL | 0 refills | Status: DC

## 2020-06-14 NOTE — TOC Transition Note (Signed)
Transition of Care [facility]) - CM/SW Discharge Note   Patient Details  Name: Jason [name] MRN: [id number] Date of Birth: 10/26/1950  Transition of Care [facility]) CM/SW Contact:  Pollie Friar, RN Phone Number: 06/14/2020, 1:05 PM   Clinical Narrative:    Pt is discharging home with outpatient therapy. Pt prefers to attend in [facility]. Orders entered and information on the AVS.  Rollator recommended but pt states his brother has one he can use.  24 hour supervision recommended. Pt states his brother will check on him but noone can 24 hours. He feels he will be ok at home.  Pt has transportation home.    Final next level of care: OP Rehab Barriers to Discharge: No Barriers Identified   Patient Goals and CMS Choice        Discharge Placement                       Discharge Plan and Services                                     Social Determinants of Health (SDOH) Interventions     Readmission Risk Interventions No flowsheet data found.

## 2020-06-14 NOTE — Progress Notes (Signed)
Physical Therapy Treatment Patient Details Name: Jason [name] MRN: [id number] DOB: 1951-02-25 Today's Date: 06/14/2020    History of Present Illness Pt is a 69 y/o male admitted secondary to AKI and nausea/vomiting. PMH includes CAD s/p CABG, tobacco use, COPD, DM, and HTN.    PT Comments    Patient received sitting at EOB, very pleasant and reporting he is feeling much better. Tolerated progression of gait distance and does demonstrate safer and more steady gait pattern with use of rollator especially as fatigue increases. Able to walk short distances without device but remains mildly unsteady and would benefit from use of device for longer/community distances. Overall doing much better. Left sitting at EOB with all needs met this morning.     Follow Up Recommendations  Outpatient PT     Equipment Recommendations  Other (comment) (rollator)    Recommendations for Other Services       Precautions / Restrictions Precautions Precautions: Fall Restrictions Weight Bearing Restrictions: No    Mobility  Bed Mobility               General bed mobility comments: sitting at EOB upon entry  Transfers Overall transfer level: Needs assistance Equipment used: None Transfers: Sit to/from Stand Sit to Stand: Modified independent (Device/Increase time)         General transfer comment: S for safety with and without rollater, safe and much more steady today  Ambulation/Gait Ambulation/Gait assistance: Supervision Gait Distance (Feet): 120 Feet (+20 more feet in room with no device) Assistive device: 4-wheeled walker Gait Pattern/deviations: Step-through pattern;Decreased stride length;Decreased step length - left;Decreased step length - right Gait velocity: Decreased   General Gait Details: gait much more steady with rollator today, no rest breaks needed and demonstrated safe use of device. Also able to walk in room without device much still mildly unsteady and  occasionally reaching for walls and bed rails.   Stairs             Wheelchair Mobility    Modified Rankin (Stroke Patients Only)       Balance Overall balance assessment: Needs assistance Sitting-balance support: No upper extremity supported;Feet supported Sitting balance-Leahy Scale: Normal     Standing balance support: No upper extremity supported;During functional activity Standing balance-Leahy Scale: Fair Standing balance comment: mildly unsteady and reaches for UE support when available                            Cognition Arousal/Alertness: Awake/alert Behavior During Therapy: WFL for tasks assessed/performed Overall Cognitive Status: Within Functional Limits for tasks assessed                                        Exercises      General Comments        Pertinent Vitals/Pain Pain Assessment: No/denies pain Faces Pain Scale: No hurt    Home Living                      Prior Function            PT Goals (current goals can now be found in the care plan section) Acute Rehab PT Goals Patient Stated Goal: to go home PT Goal Formulation: With patient Time For Goal Achievement: 06/21/20 Potential to Achieve Goals: Good Progress towards PT goals: Progressing toward goals  Frequency    Min 3X/week      PT Plan Current plan remains appropriate    Co-evaluation              AM-PAC PT "6 Clicks" Mobility   Outcome Measure  Help needed turning from your back to your side while in a flat bed without using bedrails?: None Help needed moving from lying on your back to sitting on the side of a flat bed without using bedrails?: None Help needed moving to and from a bed to a chair (including a wheelchair)?: None Help needed standing up from a chair using your arms (e.g., wheelchair or bedside chair)?: None Help needed to walk in hospital room?: A Little Help needed climbing 3-5 steps with a railing? : A  Little 6 Click Score: 22    End of Session   Activity Tolerance: Patient tolerated treatment well Patient left: in bed;with call bell/phone within reach (sitting at EOB per his request) Nurse Communication: Mobility status PT Visit Diagnosis: Other abnormalities of gait and mobility (R26.89);Pain Pain - part of body:  (back)     Time: 1155-2080 PT Time Calculation (min) (ACUTE ONLY): 17 min  Charges:  $Gait Training: 8-22 mins                     Windell Norfolk, DPT, PN1   Supplemental Physical Therapist [place]    Pager [phone] Acute Rehab Office [phone]

## 2020-06-14 NOTE — Progress Notes (Signed)
Subjective: [name]  No acute overnight events. Jason [name] notes diarrhea but had bowel prep last evening. Endorsing mild nausea.   Objective:  Vital signs in last 24 hours: Vitals:   06/13/20 1604 06/13/20 1818 06/14/20 0016 06/14/20 0506  BP: (!) 119/59 130/62 127/62 119/64  Pulse: 66 69 61 64  Resp: 18 16 18 18   Temp: (!) 97.2 F (36.2 C) (!) 97.3 F (36.3 C) (!) 97.4 F (36.3 C) 97.8 F (36.6 C)  TempSrc: Axillary Oral Oral Oral  SpO2: 95% 95% 97% 94%  Weight:    63.6 kg  Height:       CBC Latest Ref Rng & Units 06/14/2020 06/13/2020 06/11/2020  WBC 4.0 - 10.5 K/uL 6.6 8.4 6.3  Hemoglobin 13.0 - 17.0 g/dL 10.0(L) 11.4(L) 9.7(L)  Hematocrit 39.0 - 52.0 % 29.6(L) 36.0(L) 29.6(L)  Platelets 150 - 400 K/uL 239 244 199    BMP Latest Ref Rng & Units 06/14/2020 06/13/2020 06/11/2020  Glucose 70 - 99 mg/dL 123(H) 102(H) 150(H)  BUN 8 - 23 mg/dL 8 10 9   Creatinine 0.61 - 1.24 mg/dL 1.23 1.29(H) 1.20  BUN/Creat Ratio 10 - 24 - - -  Sodium 135 - 145 mmol/L 131(L) 135 132(L)  Potassium 3.5 - 5.1 mmol/L 3.6 3.8 4.2  Chloride 98 - 111 mmol/L 98 99 101  CO2 22 - 32 mmol/L 22 24 20(L)  Calcium 8.9 - 10.3 mg/dL 8.9 9.3 8.5(L)    Physical Exam Vitals and nursing note reviewed.  Constitutional:      General: He is not in acute distress.    Appearance: He is normal weight.  Pulmonary:     Effort: No respiratory distress.  Abdominal:     General: Bowel sounds are normal. There is no distension.     Palpations: Abdomen is soft.     Tenderness: There is no abdominal tenderness. There is no guarding.  Skin:    General: Skin is warm and dry.  Neurological:     Mental Status: He is alert and oriented to person, place, and time.  Psychiatric:        Mood and Affect: Mood normal.        Behavior: Behavior normal.    Assessment/Plan:  Jason [name] is a 69 year old male with a history of anxiety, PAD, CAD s/p CABG 6387 combined systolic and diastolic heart failure, hypothyroidism,  diabetes mellitus, pretension, and hyperlipidemia who presented with 1 month history of a dysphagia, N/V/D, noted to have an AKI .   Active Problems:   AKI (acute kidney injury) ([place])   Dehydration   Emesis   Diarrhea   Dysphagia   Antiplatelet or antithrombotic long-term use   Malnutrition of moderate degree   Benign neoplasm of ascending colon   Benign neoplasm of transverse colon   Benign neoplasm of sigmoid colon   Lower esophageal ring   # Nausea/vomiting/diarrhea, early satiety: Concerning for PUD (2/2 to chronic NSAID use) versus gastroparesis versus malignancy (given extensive smoking history). Obtaind stool studies to rule out infectious causes versus malabsorption, still waiting on results. Stool GI panel does not show any growing organisms. Fecal fat is normal. Stool Calprotectin is 98, normal. CTA results negative for mesenteric ischemia. As per GI holding Plavix and plan for Upper endoscopy and colonoscopy today at 1 pm. Bowel prep last evening and NPO now.  - 5th day of Holding Plavix - GI following; appreciate their recommendations - Protonix 40 mg BID - Zofran PRN - Follow up  stool studies   # AKI: Improved At baseline  - Trend creatinine  - Encourage oral intake - Avoid nephrotoxic agents, including NSAIDs  # HTN:  Well controlled.   - Continue home Metoprolol   # Chronic Normocytic anemia:   Iron studies this admission look like anemia of chronic disease. Chart review clarifies further that he has a history of multifactorial anemia 2/2 iron deficiency and vit B12 deficiency. Vitamin B12 levels are low this admission, we have started supplements. Further work up with EGD/Colonoscopy planned tomorrow. May need to consider IV vitamin b12 if there is concern for malabsorption.  -- Daily CBC -- Vitamin B12 1000 mcg daily -- Hold Iron per surgery -- s/p Feraheme 510 mg IV once  # T2DM # Diabetic Foot Ulcer Latest A1c is 6.5. Foot xray yesterday negative  for osteomyelitis or notable soft tissue swelling.   - SSI   # Hypothyroidism: - Continue levothyroxine 50 mcg daily  Prior to Admission Living Arrangement: Home Anticipated Discharge Location: Home Barriers to Discharge: Ongoing medical management Dispo: Anticipated discharge in approximately 1-2 day(s).   Dr. Meredith Staggers Jason [name] Pager: [phone] After 5pm on weekdays and 1pm on weekends: On Call pager [phone]  06/14/2020, 6:57 AM

## 2020-06-14 NOTE — Progress Notes (Signed)
RN gave pt discharge instructions and the patient stated understanding. IV has been removed and prescriptions given to patient, pt stated he is calling his ride and getting his belongings together.

## 2020-06-14 NOTE — Progress Notes (Signed)
Subjective: [name] No acute overnight events.  Jason [name] evaluated at bedside this AM. Denies nausea, diarrhea this morning.  Discussion about discharge today and close outpatient follow up, shows good understanding.   Objective:  Vital signs in last 24 hours: Vitals:   06/13/20 1604 06/13/20 1818 06/14/20 0016 06/14/20 0506  BP: (!) 119/59 130/62 127/62 119/64  Pulse: 66 69 61 64  Resp: 18 16 18 18   Temp: (!) 97.2 F (36.2 C) (!) 97.3 F (36.3 C) (!) 97.4 F (36.3 C) 97.8 F (36.6 C)  TempSrc: Axillary Oral Oral Oral  SpO2: 95% 95% 97% 94%  Weight:    63.6 kg  Height:       CBC Latest Ref Rng & Units 06/14/2020 06/13/2020 06/11/2020  WBC 4.0 - 10.5 K/uL 6.6 8.4 6.3  Hemoglobin 13.0 - 17.0 g/dL 10.0(L) 11.4(L) 9.7(L)  Hematocrit 39.0 - 52.0 % 29.6(L) 36.0(L) 29.6(L)  Platelets 150 - 400 K/uL 239 244 199    BMP Latest Ref Rng & Units 06/14/2020 06/13/2020 06/11/2020  Glucose 70 - 99 mg/dL 123(H) 102(H) 150(H)  BUN 8 - 23 mg/dL 8 10 9   Creatinine 0.61 - 1.24 mg/dL 1.23 1.29(H) 1.20  BUN/Creat Ratio 10 - 24 - - -  Sodium 135 - 145 mmol/L 131(L) 135 132(L)  Potassium 3.5 - 5.1 mmol/L 3.6 3.8 4.2  Chloride 98 - 111 mmol/L 98 99 101  CO2 22 - 32 mmol/L 22 24 20(L)  Calcium 8.9 - 10.3 mg/dL 8.9 9.3 8.5(L)    Physical Exam Vitals and nursing note reviewed.  Constitutional:      General: He is not in acute distress.    Appearance: He is normal weight.  Pulmonary:     Effort: No respiratory distress.  Abdominal:     General: Bowel sounds are normal. There is no distension.     Palpations: Abdomen is soft.     Tenderness: There is no abdominal tenderness. There is no guarding.  Skin:    General: Skin is warm and dry.  Neurological:     Mental Status: He is alert and oriented to person, place, and time.  Psychiatric:        Mood and Affect: Mood normal.        Behavior: Behavior normal.    Assessment/Plan:  Mr. Lindaman is a 69 year old male with a history of  anxiety, PAD, CAD s/p CABG 8413 combined systolic and diastolic heart failure, hypothyroidism, diabetes mellitus, pretension, and hyperlipidemia who presented with 1 month history of a dysphagia, N/V/D, noted to have an AKI which has improved.   Active Problems:   AKI (acute kidney injury) ([place])   Dehydration   Emesis   Diarrhea   Dysphagia   Antiplatelet or antithrombotic long-term use   Malnutrition of moderate degree   Benign neoplasm of ascending colon   Benign neoplasm of transverse colon   Benign neoplasm of sigmoid colon   Lower esophageal ring   # Nausea/vomiting/diarrhea, early satiety: Patient had upper endoscopy, mild Schatzki ring which was dilated. Colonoscopy has 10 diminutive polyps in sigmoid colon which were resected. GI cleared the patient for discharge with outpatient follow up. This  morning pt had big breakfast, denies nausea and feels better. Plan is discharge home today . Also, pt counseled about d/c Zoloft which might be a cause of GI distress.   - Restart Plavix - GI following; appreciate their recommendations - Protonix 40 mg BID - Zofran PRN   # AKI: Improved  At baseline  - Trend creatinine  - Encourage oral intake - Avoid nephrotoxic agents, including NSAIDs  # HTN:  Well controlled.   - Continue home Metoprolol   # Chronic Normocytic anemia:   Iron studies this admission look like anemia of chronic disease. Chart review clarifies further that he has a history of multifactorial anemia 2/2 iron deficiency and vit B12 deficiency. Vitamin B12 levels are low this admission, we have started supplements. -- Daily CBC -- Vitamin B12 1000 mcg daily -- s/p Feraheme 510 mg IV once  # T2DM # Diabetic Foot Ulcer Latest A1c is 6.5. Foot xray yesterday negative for osteomyelitis or notable soft tissue swelling.   - SSI   # Hypothyroidism: - Continue levothyroxine 50 mcg daily  Prior to Admission Living Arrangement: Home Anticipated Discharge  Location: Home Barriers to Discharge: None Dispo: Anticipated discharge in approximately 0 day(s).   Dr. Meredith Staggers Leylany Nored Pager: [phone] After 5pm on weekdays and 1pm on weekends: On Call pager [phone]  06/14/2020, 11:05 AM

## 2020-06-15 NOTE — Discharge Summary (Signed)
Name: Jason [name] MRN: [id number] DOB: 07-06-1950 69 y.o. PCP: Horald Pollen, MD  Date of Admission: 06/05/2020  8:16 PM Date of Discharge: 06/14/2020 Attending Physician: Dr. Dareen Piano Discharge Diagnosis: 1. AKI 2. Gi distress- Nausea, Vomiting, diarrhea 3. Normocytic Anemia  Discharge Medications: Allergies as of 06/14/2020      Reactions   Coreg [carvedilol] Nausea And Vomiting, Other (See Comments)   Per patient made him dizzy and light sensitive   Sulfa Antibiotics Nausea And Vomiting, Other (See Comments)   Also headaches      Medication List    STOP taking these medications   ibuprofen 200 MG tablet Commonly known as: ADVIL   sertraline 100 MG tablet Commonly known as: ZOLOFT     TAKE these medications   acetaminophen 650 MG CR tablet Commonly known as: TYLENOL Take 1,950-2,600 mg by mouth 2 times daily at 12 noon and 4 pm. What changed: Another medication with the same name was removed. Continue taking this medication, and follow the directions you see here.   aspirin 81 MG tablet Take 1 tablet (81 mg total) by mouth daily.   clopidogrel 75 MG tablet Commonly known as: PLAVIX TAKE 1 TABLET BY MOUTH EVERY DAY   cyanocobalamin 1000 MCG tablet Take 1 tablet (1,000 mcg total) by mouth daily.   diphenhydrAMINE 25 MG tablet Commonly known as: BENADRYL Take 75 mg by mouth at bedtime as needed for sleep.   docusate sodium 100 MG capsule Commonly known as: COLACE Take 100 mg by mouth 2 (two) times daily.   ferrous sulfate 325 (65 FE) MG tablet Take 1 tablet (325 mg total) by mouth daily with breakfast.   gabapentin 300 MG capsule Commonly known as: NEURONTIN Take 1 capsule (300 mg total) by mouth 2 (two) times daily.   isosorbide mononitrate 120 MG 24 hr tablet Commonly known as: IMDUR Take 1 tablet (120 mg total) by mouth daily.   Janumet 50-500 MG tablet Generic drug: sitaGLIPtin-metformin TAKE 1 TABLET BY MOUTH TWICE A DAY WITH A  MEAL. What changed:   how much to take  how to take this  when to take this  additional instructions   levothyroxine 50 MCG tablet Commonly known as: SYNTHROID Take 1 tablet (50 mcg total) by mouth daily.   loratadine 10 MG tablet Commonly known as: CLARITIN Take 10 mg by mouth daily.   metoCLOPramide 10 MG tablet Commonly known as: REGLAN Take 1 tablet (10 mg total) by mouth every 6 (six) hours.   metoprolol tartrate 25 MG tablet Commonly known as: LOPRESSOR Take 0.5 tablets (12.5 mg total) by mouth 2 (two) times daily.   nicotine 21 mg/24hr patch Commonly known as: NICODERM CQ - dosed in mg/24 hours Place 1 patch (21 mg total) onto the skin daily.   nitroGLYCERIN 0.4 MG SL tablet Commonly known as: NITROSTAT PLACE 1 TABLET (0.4 MG TOTAL) UNDER THE TONGUE EVERY 5 (FIVE) MINUTES times 3 then call MD What changed:   how much to take  how to take this  when to take this  reasons to take this  additional instructions   pantoprazole 40 MG tablet Commonly known as: PROTONIX Take 1 tablet (40 mg total) by mouth 2 (two) times daily before a meal. What changed: when to take this   ranolazine 1000 MG SR tablet Commonly known as: RANEXA TAKE 1 TABLET BY MOUTH 2 TIMES DAILY. What changed:   how much to take  how to take this  when to  take this  additional instructions   rosuvastatin 20 MG tablet Commonly known as: CRESTOR Take 1 tablet (20 mg total) by mouth daily.       Disposition and follow-up:   Jason [name] was discharged from [facility] in Stable condition.  At the hospital follow up visit please address:  1.  - AKI- Improved, no further follow up at this time      - GI Distress- Please follow up with outpatient GI doctors for biopsy results      -Normocytic Anemia- Improved, no further follow up at this time  2.  Labs / imaging needed at time of follow-up: None  3.  Pending labs/ test needing follow-up:  None  Follow-up Appointments:  Follow-up Information    Call  Horald Pollen, MD.   Specialty: Internal Medicine Why: To schedule an appointment for close follow up Contact information: [address] [place] [address] [phone]        Call  [facility].   Specialty: Gastroenterology Why: To schedule an appointment for close follow up Contact information: [place] [phone] [place] Follow up.   Specialty: Rehabilitation Why: The outpatient therapy will contact you for the first appointment Contact information: [place] by problem list: Jason [name] is 69 year old male with a history of anxiety, PAD, CAD s/p CABG [id number] combined systolic and diastolic heart failure, hypothyroidism, diabetes mellitus, pretension, and hyperlipidemia who presented with 1 month history of a early sensation of fullness, possible food impaction , and nausea and vomiting which improved with GI cocktail, noted to have an AKI.    AKI on CKD- Improved  Patient has a history of uncontrolled diabetes and uncontrolled hypertension, will intermittently take his medications.  Cr of 2.92 on admission, improved mildly to 2.79 with fluids. Creatinine is unclear, seem to be around 1.1 with a small AKI in June 2021.  Patient has been having approximately 1 month history of nausea and emesis and decreased p.o. intake, also endorsed loose stools.  He also endorses a daily use of ibuprofen about 4-5 tabs twice a day for multiple years for his chronic back pain.  Does endorse some mild slowing of his urine however denies any straining, difficulty emptying, dysuria, hematuria, or abdominal pain.  Urinalysis done showed 30 protein, negative for nitrates, hemoglobin, ketones, or leukocytes.   Patient does appear volume down on exam, this could be prerenal secondary to decreased p.o. intake however this also could be progression due to to his ibuprofen use, uncontrolled hypertension, and diabetes. Received IVF and improved.     Nausea/vomiting, early satiety: Concerning for PUD (2/2 to chronic NSAID use) versus gastroparesis versus malignancy (given extensive smoking history). Obtained stool studies to rule out infectious causes versus malabsorption, were normal. CTA obtained due to extensive vascular disease history and postprandial symptoms; results negative for mesenteric ischemia. Gi consulted and ss per GI held Plavix for 5 days to do Upper endoscopy and colonoscopy. Patient had upper endoscopy, mild Schatzki ring which was dilated. Colonoscopy has 10 diminutive polyps in sigmoid colon which were resected. GI cleared the patient for discharge with outpatient follow up.      Normocytic anemia:  Patient Hb 9.7 fluctuating around it. Iron studies this  admission look like anemia of chronic disease. Chart review clarifies further that he has a history of multifactorial anemia 2/2 iron deficiency and vit B12 deficiency. Vitamin B12 levels are low this admission, we have started supplements. Further work up with EGD/Colonoscopy .May need to consider IV vitamin b12 if there is concern for malabsorption. Received Feraheme 510 mg IV once.  Discharge Vitals:   BP 119/64 (BP Location: Left Arm)   Pulse 64   Temp 97.8 F (36.6 C) (Oral)   Resp 18   Ht 5\' 8"  (1.727 m)   Wt 63.6 kg   SpO2 94%   BMI 21.32 kg/m   Pertinent Labs, Studies, and Procedures:  CBC Latest Ref Rng & Units 06/14/2020 06/13/2020 06/11/2020  WBC 4.0 - 10.5 K/uL 6.6 8.4 6.3  Hemoglobin 13.0 - 17.0 g/dL 10.0(L) 11.4(L) 9.7(L)  Hematocrit 39.0 - 52.0 % 29.6(L) 36.0(L) 29.6(L)  Platelets 150 - 400 K/uL 239 244 199   BMP Latest Ref Rng & Units 06/14/2020 06/13/2020 06/11/2020  Glucose 70 - 99 mg/dL 123(H) 102(H) 150(H)   BUN 8 - 23 mg/dL 8 10 9   Creatinine 0.61 - 1.24 mg/dL 1.23 1.29(H) 1.20  BUN/Creat Ratio 10 - 24 - - -  Sodium 135 - 145 mmol/L 131(L) 135 132(L)  Potassium 3.5 - 5.1 mmol/L 3.6 3.8 4.2  Chloride 98 - 111 mmol/L 98 99 101  CO2 22 - 32 mmol/L 22 24 20(L)  Calcium 8.9 - 10.3 mg/dL 8.9 9.3 8.5(L)   X-ray Abdomen   IMPRESSION: 1. Normal bowel gas pattern, no free air. 2. No acute cardiopulmonary abnormality. Prior CABG. Stable chronic probable smoking related interstitial markings. 3. Evidence of abdominal and pelvic atherosclerosis. Left external iliac artery vascular stent. 4. Prior lumbar decompression and fusion with adjacent segment Disease.  U/S Renal   IMPRESSION: No acute abnormality.  X-ray B/L foot IMPRESSION: 1. No acute fracture. 2. Medial subluxation of the third MTP joint, progressed since the prior radiograph. 3. Status post prior amputation of the first and second metatarsals.  CT Angio  IMPRESSION:  VASCULAR  No evidence of acute abdominopelvic vascular abnormality.  Aortoiliac atherosclerotic disease without significant flow limiting stenosis. Aortic Atherosclerosis (ICD10-I70.0).  Patent indwelling left external iliac artery stent.  The visualized portion of the proximal right femoral artery bypass graft is patent.  NON-VASCULAR  No evidence of acute abdominopelvic abnormality. Specifically, no evidence of mesenteric ischemia as queried.  Endoscopy  Impression  Mild Schatzki ring. Dilated. - Gastroesophageal flap valve classified as Hill Grade III (minimal fold, loose to endoscope, hiatal hernia likely). - Normal mucosa was found in the entire stomach. - Normal examined duodenum. Biopsied.  Colonoscopy:  Impression   Erythema found on perianal exam. - Ten diminutive polyps in the sigmoid colon, in the transverse colon and in the ascending colon, removed with a cold snare. Resected and retrieved. - Normal mucosa in the  entire examined colon. Biopsied. - Internal hemorrhoids. - The examination was otherwise normal on direct and retroflexion views    Discharge Instructions: Discharge Instructions    Ambulatory referral to Physical Therapy   Complete by: As directed    Call MD for:  extreme fatigue   Complete by: As directed    Call MD for:  persistant dizziness or light-headedness   Complete by: As directed    Call MD for:  persistant nausea and vomiting   Complete by: As directed    Diet - low sodium heart healthy   Complete by: As directed  Discharge instructions   Complete by: As directed    Dear Jason [name],  You presented with nausea, vomiting, diarrhea and decreased appetite. You had procedures done yesterday and they looked fine. You have to follow up with GI doctors as an outpatient. Also, you are instructed to stop using Zoloft ( anti-depressants) as it might be a possible reason for your GI problems.  Your kidneys were not doing good and admission but they have improved significantly and are at baseline.  Please continue taking your medications and see psychiatrist for anti-depressant change.  It was pleasure taking care of you!  Dr. Demaris Callander   Increase activity slowly   Complete by: As directed       Signed: Honor Junes, MD 06/15/2020, 9:15 AM   Pager: [phone]

## 2020-06-18 LAB — SURGICAL PATHOLOGY

## 2020-06-19 ENCOUNTER — Encounter: Payer: Self-pay | Admitting: Internal Medicine

## 2020-06-19 ENCOUNTER — Telehealth: Payer: Self-pay

## 2020-06-19 NOTE — Telephone Encounter (Signed)
Left message to please call back. °

## 2020-06-19 NOTE — Telephone Encounter (Signed)
-----   Message from Iva Boop, MD sent at 06/19/2020 11:53 AM EST ----- Please contact the patient and explained that his colon polyps were all benign but precancerous  Intestinal biopsies were okay  He was seen at the hospital for diarrhea and dysphagia issues and had an EGD colonoscopy  He needs the following:  1- one year colonoscopy recall with me  2-Follow-up in January regarding diarrhea and dysphagia and GERD  3-find out how he is doing and if he is not improved with his GI symptoms let me know and I may advise some different medications while he is waiting for a follow-up

## 2020-07-18 ENCOUNTER — Ambulatory Visit: Payer: Medicare Other | Admitting: Internal Medicine

## 2020-08-31 ENCOUNTER — Other Ambulatory Visit: Payer: Self-pay | Admitting: Medical

## 2020-09-01 ENCOUNTER — Inpatient Hospital Stay (HOSPITAL_COMMUNITY): Payer: Medicare Other

## 2020-09-01 ENCOUNTER — Emergency Department (HOSPITAL_COMMUNITY): Payer: Medicare Other

## 2020-09-01 ENCOUNTER — Inpatient Hospital Stay (HOSPITAL_COMMUNITY)
Admission: EM | Disposition: A | Discharge status: Home / Self Care | Payer: Self-pay | Source: Home / Self Care | Attending: Internal Medicine

## 2020-09-01 ENCOUNTER — Other Ambulatory Visit: Payer: Self-pay

## 2020-09-01 ENCOUNTER — Encounter (HOSPITAL_COMMUNITY): Payer: Self-pay

## 2020-09-01 ENCOUNTER — Inpatient Hospital Stay (HOSPITAL_COMMUNITY)
Admission: EM | Admit: 2020-09-01 | Discharge: 2020-09-06 | DRG: 280 | Disposition: A | Discharge status: Home / Self Care | Payer: Medicare Other | Attending: Internal Medicine | Admitting: Internal Medicine

## 2020-09-01 DIAGNOSIS — Z8673 Personal history of transient ischemic attack (TIA), and cerebral infarction without residual deficits: Secondary | ICD-10-CM

## 2020-09-01 DIAGNOSIS — Z955 Presence of coronary angioplasty implant and graft: Secondary | ICD-10-CM

## 2020-09-01 DIAGNOSIS — Z7982 Long term (current) use of aspirin: Secondary | ICD-10-CM | POA: Diagnosis not present

## 2020-09-01 DIAGNOSIS — I252 Old myocardial infarction: Secondary | ICD-10-CM | POA: Diagnosis not present

## 2020-09-01 DIAGNOSIS — Z7902 Long term (current) use of antithrombotics/antiplatelets: Secondary | ICD-10-CM | POA: Diagnosis not present

## 2020-09-01 DIAGNOSIS — E039 Hypothyroidism, unspecified: Secondary | ICD-10-CM | POA: Diagnosis present

## 2020-09-01 DIAGNOSIS — E7800 Pure hypercholesterolemia, unspecified: Secondary | ICD-10-CM | POA: Diagnosis present

## 2020-09-01 DIAGNOSIS — Z452 Encounter for adjustment and management of vascular access device: Secondary | ICD-10-CM

## 2020-09-01 DIAGNOSIS — I214 Non-ST elevation (NSTEMI) myocardial infarction: Secondary | ICD-10-CM | POA: Diagnosis present

## 2020-09-01 DIAGNOSIS — Z89421 Acquired absence of other right toe(s): Secondary | ICD-10-CM

## 2020-09-01 DIAGNOSIS — E43 Unspecified severe protein-calorie malnutrition: Secondary | ICD-10-CM | POA: Insufficient documentation

## 2020-09-01 DIAGNOSIS — I2510 Atherosclerotic heart disease of native coronary artery without angina pectoris: Secondary | ICD-10-CM | POA: Diagnosis present

## 2020-09-01 DIAGNOSIS — K219 Gastro-esophageal reflux disease without esophagitis: Secondary | ICD-10-CM | POA: Diagnosis present

## 2020-09-01 DIAGNOSIS — Z85828 Personal history of other malignant neoplasm of skin: Secondary | ICD-10-CM

## 2020-09-01 DIAGNOSIS — Z79890 Hormone replacement therapy (postmenopausal): Secondary | ICD-10-CM

## 2020-09-01 DIAGNOSIS — J449 Chronic obstructive pulmonary disease, unspecified: Secondary | ICD-10-CM | POA: Diagnosis present

## 2020-09-01 DIAGNOSIS — N170 Acute kidney failure with tubular necrosis: Secondary | ICD-10-CM | POA: Diagnosis present

## 2020-09-01 DIAGNOSIS — I110 Hypertensive heart disease with heart failure: Secondary | ICD-10-CM | POA: Diagnosis present

## 2020-09-01 DIAGNOSIS — Z20822 Contact with and (suspected) exposure to covid-19: Secondary | ICD-10-CM | POA: Diagnosis present

## 2020-09-01 DIAGNOSIS — I5021 Acute systolic (congestive) heart failure: Secondary | ICD-10-CM | POA: Diagnosis present

## 2020-09-01 DIAGNOSIS — F32A Depression, unspecified: Secondary | ICD-10-CM | POA: Diagnosis present

## 2020-09-01 DIAGNOSIS — Z79899 Other long term (current) drug therapy: Secondary | ICD-10-CM | POA: Diagnosis not present

## 2020-09-01 DIAGNOSIS — E1151 Type 2 diabetes mellitus with diabetic peripheral angiopathy without gangrene: Secondary | ICD-10-CM | POA: Diagnosis present

## 2020-09-01 DIAGNOSIS — R570 Cardiogenic shock: Secondary | ICD-10-CM | POA: Diagnosis present

## 2020-09-01 DIAGNOSIS — E785 Hyperlipidemia, unspecified: Secondary | ICD-10-CM | POA: Diagnosis present

## 2020-09-01 DIAGNOSIS — I25810 Atherosclerosis of coronary artery bypass graft(s) without angina pectoris: Secondary | ICD-10-CM | POA: Diagnosis present

## 2020-09-01 DIAGNOSIS — Z6821 Body mass index (BMI) 21.0-21.9, adult: Secondary | ICD-10-CM

## 2020-09-01 DIAGNOSIS — F17210 Nicotine dependence, cigarettes, uncomplicated: Secondary | ICD-10-CM | POA: Diagnosis present

## 2020-09-01 DIAGNOSIS — I5043 Acute on chronic combined systolic (congestive) and diastolic (congestive) heart failure: Secondary | ICD-10-CM

## 2020-09-01 DIAGNOSIS — Z7984 Long term (current) use of oral hypoglycemic drugs: Secondary | ICD-10-CM

## 2020-09-01 DIAGNOSIS — E871 Hypo-osmolality and hyponatremia: Secondary | ICD-10-CM | POA: Diagnosis present

## 2020-09-01 DIAGNOSIS — E876 Hypokalemia: Secondary | ICD-10-CM | POA: Diagnosis present

## 2020-09-01 HISTORY — PX: RIGHT/LEFT HEART CATH AND CORONARY/GRAFT ANGIOGRAPHY: CATH118267

## 2020-09-01 LAB — CBC
HCT: 43.2 % (ref 39.0–52.0)
HCT: 39.4 % (ref 39.0–52.0)
Hemoglobin: 13.6 g/dL (ref 13.0–17.0)
Hemoglobin: 12.9 g/dL — ABNORMAL LOW (ref 13.0–17.0)
MCH: 31.4 pg (ref 26.0–34.0)
MCH: 31.7 pg (ref 26.0–34.0)
MCHC: 31.5 g/dL (ref 30.0–36.0)
MCHC: 32.7 g/dL (ref 30.0–36.0)
MCV: 99.8 fL (ref 80.0–100.0)
MCV: 96.8 fL (ref 80.0–100.0)
Platelets: 263 10*3/uL (ref 150–400)
Platelets: 263 10*3/uL (ref 150–400)
RBC: 4.33 MIL/uL (ref 4.22–5.81)
RBC: 4.07 MIL/uL — ABNORMAL LOW (ref 4.22–5.81)
RDW: 17.1 % — ABNORMAL HIGH (ref 11.5–15.5)
RDW: 16.9 % — ABNORMAL HIGH (ref 11.5–15.5)
WBC: 15.3 10*3/uL — ABNORMAL HIGH (ref 4.0–10.5)
WBC: 15.5 10*3/uL — ABNORMAL HIGH (ref 4.0–10.5)
nRBC: 0 % (ref 0.0–0.2)
nRBC: 0 % (ref 0.0–0.2)

## 2020-09-01 LAB — BASIC METABOLIC PANEL
Anion gap: 9 (ref 5–15)
Anion gap: 9 (ref 5–15)
BUN: 12 mg/dL (ref 8–23)
BUN: 11 mg/dL (ref 8–23)
CO2: 19 mmol/L — ABNORMAL LOW (ref 22–32)
CO2: 19 mmol/L — ABNORMAL LOW (ref 22–32)
Calcium: 9.1 mg/dL (ref 8.9–10.3)
Calcium: 9.2 mg/dL (ref 8.9–10.3)
Chloride: 106 mmol/L (ref 98–111)
Chloride: 107 mmol/L (ref 98–111)
Creatinine, Ser: 1.36 mg/dL — ABNORMAL HIGH (ref 0.61–1.24)
Creatinine, Ser: 1.29 mg/dL — ABNORMAL HIGH (ref 0.61–1.24)
GFR, Estimated: 57 mL/min — ABNORMAL LOW (ref >60)
GFR, Estimated: >60 mL/min (ref >60)
Glucose, Bld: 137 mg/dL — ABNORMAL HIGH (ref 70–99)
Glucose, Bld: 121 mg/dL — ABNORMAL HIGH (ref 70–99)
Potassium: 3.9 mmol/L (ref 3.5–5.1)
Potassium: 4.2 mmol/L (ref 3.5–5.1)
Sodium: 134 mmol/L — ABNORMAL LOW (ref 135–145)
Sodium: 135 mmol/L (ref 135–145)

## 2020-09-01 LAB — COOXEMETRY PANEL
Carboxyhemoglobin: 1.2 % (ref 0.5–1.5)
Carboxyhemoglobin: 1.3 % (ref 0.5–1.5)
Methemoglobin: 1 % (ref 0.0–1.5)
Methemoglobin: 1.2 % (ref 0.0–1.5)
O2 Saturation: 71.8 %
O2 Saturation: 59.4 %
Total hemoglobin: 14 g/dL (ref 12.0–16.0)
Total hemoglobin: 11.8 g/dL — ABNORMAL LOW (ref 12.0–16.0)

## 2020-09-01 LAB — GLUCOSE, CAPILLARY: Glucose-Capillary: 189 mg/dL — ABNORMAL HIGH (ref 70–99)

## 2020-09-01 LAB — LIPID PANEL
Cholesterol: 159 mg/dL (ref 0–200)
HDL: 44 mg/dL (ref >40)
LDL Cholesterol: 86 mg/dL (ref 0–99)
Total CHOL/HDL Ratio: 3.6 RATIO
Triglycerides: 146 mg/dL (ref <150)
VLDL: 29 mg/dL (ref 0–40)

## 2020-09-01 LAB — TROPONIN I (HIGH SENSITIVITY)
Troponin I (High Sensitivity): 10422 ng/L (ref <18)
Troponin I (High Sensitivity): 11585 ng/L (ref <18)

## 2020-09-01 LAB — PROTIME-INR
INR: 1.1 (ref 0.8–1.2)
Prothrombin Time: 13.6 seconds (ref 11.4–15.2)

## 2020-09-01 LAB — RESP PANEL BY RT-PCR (FLU A&B, COVID) ARPGX2
Influenza A by PCR: NEGATIVE
Influenza B by PCR: NEGATIVE
SARS Coronavirus 2 by RT PCR: NEGATIVE

## 2020-09-01 LAB — MRSA PCR SCREENING: MRSA by PCR: NEGATIVE

## 2020-09-01 LAB — HIV ANTIBODY (ROUTINE TESTING W REFLEX): HIV Screen 4th Generation wRfx: NONREACTIVE

## 2020-09-01 LAB — HEPARIN LEVEL (UNFRACTIONATED): Heparin Unfractionated: 0.17 IU/mL — ABNORMAL LOW (ref 0.30–0.70)

## 2020-09-01 SURGERY — RIGHT/LEFT HEART CATH AND CORONARY/GRAFT ANGIOGRAPHY
Anesthesia: LOCAL

## 2020-09-01 MED ORDER — IOHEXOL 350 MG/ML SOLN
INTRAVENOUS | Status: DC | PRN
  Administered 2020-09-01: 95 mL

## 2020-09-01 MED ORDER — HEPARIN (PORCINE) 25000 UT/250ML-% IV SOLN
1000.0000 [IU]/h | INTRAVENOUS | Status: DC
  Filled 2020-09-01: qty 250

## 2020-09-01 MED ORDER — MORPHINE SULFATE (PF) 2 MG/ML IV SOLN
2.0000 mg | Freq: Once | INTRAVENOUS | Status: AC
  Administered 2020-09-01: 2 mg via INTRAVENOUS
  Filled 2020-09-01: qty 1

## 2020-09-01 MED ORDER — FUROSEMIDE 10 MG/ML IJ SOLN: 80.0000 mg | Freq: Two times a day (BID) | INTRAMUSCULAR | Status: DC

## 2020-09-01 MED ORDER — RANOLAZINE ER 500 MG PO TB12
1000.0000 mg | ORAL_TABLET | Freq: Two times a day (BID) | ORAL | Status: DC
  Administered 2020-09-01: 1000 mg via ORAL
  Filled 2020-09-01 (×2): qty 2

## 2020-09-01 MED ORDER — LIDOCAINE HCL (PF) 1 % IJ SOLN
INTRAMUSCULAR | Status: AC
  Filled 2020-09-01: qty 30

## 2020-09-01 MED ORDER — SODIUM CHLORIDE 0.9% FLUSH
3.0000 mL | Freq: Two times a day (BID) | INTRAVENOUS | Status: DC
  Administered 2020-09-01 – 2020-09-06 (×7): 3 mL via INTRAVENOUS

## 2020-09-01 MED ORDER — SODIUM CHLORIDE 0.9 % IV SOLN: 250.0000 mL | INTRAVENOUS | Status: DC | PRN

## 2020-09-01 MED ORDER — SODIUM CHLORIDE 0.9% FLUSH
10.0000 mL | Freq: Two times a day (BID) | INTRAVENOUS | Status: DC
  Administered 2020-09-01 – 2020-09-06 (×7): 10 mL

## 2020-09-01 MED ORDER — LEVOTHYROXINE SODIUM 50 MCG PO TABS
50.0000 ug | ORAL_TABLET | Freq: Every day | ORAL | Status: DC
  Administered 2020-09-01: 50 ug via ORAL
  Filled 2020-09-01: qty 1

## 2020-09-01 MED ORDER — METOPROLOL TARTRATE 12.5 MG HALF TABLET
12.5000 mg | ORAL_TABLET | Freq: Two times a day (BID) | ORAL | Status: DC
  Administered 2020-09-01: 12.5 mg via ORAL
  Filled 2020-09-01: qty 1

## 2020-09-01 MED ORDER — SODIUM CHLORIDE 0.9 % IV SOLN: INTRAVENOUS | Status: DC

## 2020-09-01 MED ORDER — ASPIRIN EC 81 MG PO TBEC
81.0000 mg | DELAYED_RELEASE_TABLET | Freq: Every day | ORAL | Status: DC
  Administered 2020-09-02 – 2020-09-06 (×5): 81 mg via ORAL
  Filled 2020-09-01 (×5): qty 1

## 2020-09-01 MED ORDER — HYDRALAZINE HCL 20 MG/ML IJ SOLN: 10.0000 mg | INTRAMUSCULAR | Status: DC | PRN

## 2020-09-01 MED ORDER — LEVOTHYROXINE SODIUM 50 MCG PO TABS
50.0000 ug | ORAL_TABLET | Freq: Every day | ORAL | Status: DC
  Administered 2020-09-02 – 2020-09-06 (×5): 50 ug via ORAL
  Filled 2020-09-01 (×5): qty 1

## 2020-09-01 MED ORDER — MIDAZOLAM HCL 2 MG/2ML IJ SOLN
INTRAMUSCULAR | Status: DC | PRN
  Administered 2020-09-01: 1 mg via INTRAVENOUS

## 2020-09-01 MED ORDER — ONDANSETRON HCL 4 MG/2ML IJ SOLN
INTRAMUSCULAR | Status: DC | PRN
  Administered 2020-09-01: 4 mg via INTRAVENOUS

## 2020-09-01 MED ORDER — MILRINONE LACTATE IN DEXTROSE 20-5 MG/100ML-% IV SOLN
0.1250 ug/kg/min | INTRAVENOUS | Status: DC
  Administered 2020-09-01: 0.125 ug/kg/min via INTRAVENOUS
  Filled 2020-09-01: qty 100

## 2020-09-01 MED ORDER — CLOPIDOGREL BISULFATE 75 MG PO TABS
75.0000 mg | ORAL_TABLET | Freq: Every day | ORAL | Status: DC
  Administered 2020-09-02 – 2020-09-06 (×5): 75 mg via ORAL
  Filled 2020-09-01 (×5): qty 1

## 2020-09-01 MED ORDER — SODIUM CHLORIDE 0.9% FLUSH: 3.0000 mL | Freq: Two times a day (BID) | INTRAVENOUS | Status: DC

## 2020-09-01 MED ORDER — VERAPAMIL HCL 2.5 MG/ML IV SOLN: INTRAVENOUS | Status: DC | PRN

## 2020-09-01 MED ORDER — CHLORHEXIDINE GLUCONATE CLOTH 2 % EX PADS
6.0000 | MEDICATED_PAD | Freq: Every day | CUTANEOUS | Status: DC
  Administered 2020-09-05: 6 via TOPICAL

## 2020-09-01 MED ORDER — HEPARIN BOLUS VIA INFUSION
2000.0000 [IU] | Freq: Once | INTRAVENOUS | Status: AC
  Administered 2020-09-01: 2000 [IU] via INTRAVENOUS
  Filled 2020-09-01: qty 2000

## 2020-09-01 MED ORDER — PANTOPRAZOLE SODIUM 40 MG PO TBEC
40.0000 mg | DELAYED_RELEASE_TABLET | Freq: Every day | ORAL | Status: DC
  Administered 2020-09-02 – 2020-09-06 (×5): 40 mg via ORAL
  Filled 2020-09-01 (×5): qty 1

## 2020-09-01 MED ORDER — NITROGLYCERIN 1 MG/10 ML FOR IR/CATH LAB
INTRA_ARTERIAL | Status: AC
  Filled 2020-09-01: qty 10

## 2020-09-01 MED ORDER — RANOLAZINE ER 500 MG PO TB12
1000.0000 mg | ORAL_TABLET | Freq: Two times a day (BID) | ORAL | Status: DC
  Administered 2020-09-01 – 2020-09-06 (×10): 1000 mg via ORAL
  Filled 2020-09-01 (×10): qty 2

## 2020-09-01 MED ORDER — ASPIRIN 81 MG PO CHEW
324.0000 mg | CHEWABLE_TABLET | Freq: Once | ORAL | Status: AC
  Administered 2020-09-01: 324 mg via ORAL
  Filled 2020-09-01: qty 4

## 2020-09-01 MED ORDER — NITROGLYCERIN IN D5W 200-5 MCG/ML-% IV SOLN
0.0000 ug/min | INTRAVENOUS | Status: DC
  Administered 2020-09-01: 2 ug/min via INTRAVENOUS
  Filled 2020-09-01: qty 250

## 2020-09-01 MED ORDER — FUROSEMIDE 10 MG/ML IJ SOLN
INTRAMUSCULAR | Status: AC
  Filled 2020-09-01: qty 8

## 2020-09-01 MED ORDER — SODIUM CHLORIDE 0.9% FLUSH
3.0000 mL | INTRAVENOUS | Status: DC | PRN
Start: 2020-09-01 — End: 2020-09-06
  Administered 2020-09-04: 3 mL via INTRAVENOUS

## 2020-09-01 MED ORDER — SODIUM CHLORIDE 0.9% FLUSH: 10.0000 mL | INTRAVENOUS | Status: DC | PRN

## 2020-09-01 MED ORDER — SODIUM CHLORIDE 0.9% IV SOLUTION: INTRAVENOUS | Status: DC

## 2020-09-01 MED ORDER — FENTANYL CITRATE (PF) 100 MCG/2ML IJ SOLN
INTRAMUSCULAR | Status: AC
  Filled 2020-09-01: qty 2

## 2020-09-01 MED ORDER — FUROSEMIDE 10 MG/ML IJ SOLN
80.0000 mg | Freq: Two times a day (BID) | INTRAMUSCULAR | Status: DC
  Administered 2020-09-01 – 2020-09-03 (×4): 80 mg via INTRAVENOUS
  Filled 2020-09-01 (×4): qty 8

## 2020-09-01 MED ORDER — FENTANYL CITRATE (PF) 100 MCG/2ML IJ SOLN
INTRAMUSCULAR | Status: DC | PRN
  Administered 2020-09-01: 25 ug via INTRAVENOUS

## 2020-09-01 MED ORDER — IOHEXOL 350 MG/ML SOLN
INTRAVENOUS | Status: AC
  Filled 2020-09-01: qty 1

## 2020-09-01 MED ORDER — ASPIRIN EC 81 MG PO TBEC
81.0000 mg | DELAYED_RELEASE_TABLET | Freq: Every day | ORAL | Status: DC
Start: 2020-09-01 — End: 2020-09-01
  Administered 2020-09-01: 81 mg via ORAL
  Filled 2020-09-01: qty 1

## 2020-09-01 MED ORDER — NITROGLYCERIN 0.4 MG SL SUBL: 0.4000 mg | SUBLINGUAL_TABLET | SUBLINGUAL | Status: DC | PRN

## 2020-09-01 MED ORDER — ROSUVASTATIN CALCIUM 20 MG PO TABS
20.0000 mg | ORAL_TABLET | Freq: Every day | ORAL | Status: DC
  Administered 2020-09-02 – 2020-09-03 (×2): 20 mg via ORAL
  Filled 2020-09-01 (×3): qty 1

## 2020-09-01 MED ORDER — ASPIRIN 300 MG RE SUPP: 300.0000 mg | RECTAL | Status: DC

## 2020-09-01 MED ORDER — NICOTINE 21 MG/24HR TD PT24
21.0000 mg | MEDICATED_PATCH | Freq: Every day | TRANSDERMAL | Status: DC
  Administered 2020-09-01: 21 mg via TRANSDERMAL
  Filled 2020-09-01: qty 1

## 2020-09-01 MED ORDER — FUROSEMIDE 10 MG/ML IJ SOLN
INTRAMUSCULAR | Status: DC | PRN
  Administered 2020-09-01: 80 mg via INTRAVENOUS

## 2020-09-01 MED ORDER — ONDANSETRON HCL 4 MG/2ML IJ SOLN
INTRAMUSCULAR | Status: AC
  Filled 2020-09-01: qty 2

## 2020-09-01 MED ORDER — PANTOPRAZOLE SODIUM 40 MG PO TBEC
40.0000 mg | DELAYED_RELEASE_TABLET | Freq: Two times a day (BID) | ORAL | Status: DC
  Administered 2020-09-01 (×2): 40 mg via ORAL
  Filled 2020-09-01 (×2): qty 1

## 2020-09-01 MED ORDER — MIDAZOLAM HCL 2 MG/2ML IJ SOLN
INTRAMUSCULAR | Status: AC
  Filled 2020-09-01: qty 2

## 2020-09-01 MED ORDER — ASPIRIN 81 MG PO CHEW: 324.0000 mg | CHEWABLE_TABLET | ORAL | Status: DC

## 2020-09-01 MED ORDER — HEPARIN (PORCINE) 25000 UT/250ML-% IV SOLN
1050.0000 [IU]/h | INTRAVENOUS | Status: DC
  Administered 2020-09-01: 800 [IU]/h via INTRAVENOUS
  Filled 2020-09-01: qty 250

## 2020-09-01 MED ORDER — NITROGLYCERIN 0.4 MG SL SUBL
0.4000 mg | SUBLINGUAL_TABLET | SUBLINGUAL | Status: DC | PRN
Start: 2020-09-01 — End: 2020-09-01
  Filled 2020-09-01: qty 1

## 2020-09-01 MED ORDER — HEPARIN SODIUM (PORCINE) 1000 UNIT/ML IJ SOLN
INTRAMUSCULAR | Status: DC | PRN
  Administered 2020-09-01: 3000 [IU] via INTRAVENOUS

## 2020-09-01 MED ORDER — HEPARIN (PORCINE) IN NACL 1000-0.9 UT/500ML-% IV SOLN
INTRAVENOUS | Status: AC
  Filled 2020-09-01: qty 1500

## 2020-09-01 MED ORDER — ROSUVASTATIN CALCIUM 20 MG PO TABS
20.0000 mg | ORAL_TABLET | Freq: Every day | ORAL | Status: DC
  Administered 2020-09-01: 20 mg via ORAL
  Filled 2020-09-01: qty 1

## 2020-09-01 MED ORDER — CHLORHEXIDINE GLUCONATE CLOTH 2 % EX PADS
6.0000 | MEDICATED_PAD | Freq: Every day | CUTANEOUS | Status: DC
  Administered 2020-09-02 – 2020-09-06 (×4): 6 via TOPICAL

## 2020-09-01 MED ORDER — LIDOCAINE HCL (PF) 1 % IJ SOLN
INTRAMUSCULAR | Status: DC | PRN
  Administered 2020-09-01: 2 mL
  Administered 2020-09-01: 20 mL

## 2020-09-01 MED ORDER — GABAPENTIN 300 MG PO CAPS
600.0000 mg | ORAL_CAPSULE | Freq: Two times a day (BID) | ORAL | Status: DC
  Administered 2020-09-01 – 2020-09-06 (×10): 600 mg via ORAL
  Filled 2020-09-01 (×10): qty 2

## 2020-09-01 MED ORDER — GABAPENTIN 300 MG PO CAPS
300.0000 mg | ORAL_CAPSULE | Freq: Two times a day (BID) | ORAL | Status: DC
  Administered 2020-09-01: 300 mg via ORAL
  Filled 2020-09-01: qty 1

## 2020-09-01 MED ORDER — CHLORHEXIDINE GLUCONATE CLOTH 2 % EX PADS
6.0000 | MEDICATED_PAD | Freq: Every day | CUTANEOUS | Status: DC
  Administered 2020-09-01: 6 via TOPICAL

## 2020-09-01 MED ORDER — ONDANSETRON HCL 4 MG/2ML IJ SOLN
4.0000 mg | Freq: Four times a day (QID) | INTRAMUSCULAR | Status: DC | PRN
Start: 2020-09-01 — End: 2020-09-01

## 2020-09-01 MED ORDER — ACETAMINOPHEN 325 MG PO TABS
650.0000 mg | ORAL_TABLET | ORAL | Status: DC | PRN
  Administered 2020-09-02 – 2020-09-05 (×6): 650 mg via ORAL
  Filled 2020-09-01 (×6): qty 2

## 2020-09-01 MED ORDER — MORPHINE SULFATE (PF) 2 MG/ML IV SOLN
2.0000 mg | INTRAVENOUS | Status: DC | PRN
  Administered 2020-09-01: 2 mg via INTRAVENOUS
  Filled 2020-09-01: qty 1

## 2020-09-01 MED ORDER — SODIUM CHLORIDE 0.9 % IV SOLN
INTRAVENOUS | Status: AC | PRN
Start: 2020-09-01 — End: 2020-09-01
  Administered 2020-09-01: 10 mL/h via INTRAVENOUS

## 2020-09-01 MED ORDER — HEPARIN (PORCINE) 25000 UT/250ML-% IV SOLN
1000.0000 [IU]/h | INTRAVENOUS | Status: AC
  Administered 2020-09-01: 1000 [IU]/h via INTRAVENOUS
  Administered 2020-09-03: 1250 [IU]/h via INTRAVENOUS
  Filled 2020-09-01 (×2): qty 250

## 2020-09-01 MED ORDER — HEPARIN BOLUS VIA INFUSION
4000.0000 [IU] | Freq: Once | INTRAVENOUS | Status: AC
  Administered 2020-09-01: 4000 [IU] via INTRAVENOUS
  Filled 2020-09-01: qty 4000

## 2020-09-01 MED ORDER — HEPARIN SODIUM (PORCINE) 1000 UNIT/ML IJ SOLN
INTRAMUSCULAR | Status: AC
  Filled 2020-09-01: qty 1

## 2020-09-01 MED ORDER — SODIUM CHLORIDE 0.9% FLUSH: 10.0000 mL | Freq: Two times a day (BID) | INTRAVENOUS | Status: DC

## 2020-09-01 MED ORDER — CLOPIDOGREL BISULFATE 75 MG PO TABS
75.0000 mg | ORAL_TABLET | Freq: Every day | ORAL | Status: DC
Start: 2020-09-01 — End: 2020-09-01
  Administered 2020-09-01: 75 mg via ORAL
  Filled 2020-09-01: qty 1

## 2020-09-01 MED ORDER — SODIUM CHLORIDE 0.9% FLUSH: 3.0000 mL | INTRAVENOUS | Status: DC | PRN

## 2020-09-01 MED ORDER — ACETAMINOPHEN 325 MG PO TABS: 650.0000 mg | ORAL_TABLET | ORAL | Status: DC | PRN

## 2020-09-01 MED ORDER — MILRINONE LACTATE IN DEXTROSE 20-5 MG/100ML-% IV SOLN
0.1250 ug/kg/min | INTRAVENOUS | Status: DC
  Administered 2020-09-01 – 2020-09-02 (×2): 0.125 ug/kg/min via INTRAVENOUS
  Filled 2020-09-01: qty 100

## 2020-09-01 MED ORDER — VERAPAMIL HCL 2.5 MG/ML IV SOLN
INTRAVENOUS | Status: AC
  Filled 2020-09-01: qty 2

## 2020-09-01 MED ORDER — ONDANSETRON HCL 4 MG/2ML IJ SOLN: 4.0000 mg | Freq: Four times a day (QID) | INTRAMUSCULAR | Status: DC | PRN

## 2020-09-01 MED ORDER — HEPARIN (PORCINE) IN NACL 1000-0.9 UT/500ML-% IV SOLN
INTRAVENOUS | Status: DC | PRN
  Administered 2020-09-01 (×2): 500 mL

## 2020-09-01 MED ORDER — NICOTINE 21 MG/24HR TD PT24
21.0000 mg | MEDICATED_PATCH | Freq: Every day | TRANSDERMAL | Status: DC
  Administered 2020-09-02 – 2020-09-06 (×5): 21 mg via TRANSDERMAL
  Filled 2020-09-01 (×5): qty 1

## 2020-09-01 SURGICAL SUPPLY — 14 items
CATH INFINITI 5FR AL1 (CATHETERS) ×1 IMPLANT
CATH INFINITI 5FR MULTPACK ANG (CATHETERS) ×1 IMPLANT
CATH SWAN GANZ 7F STRAIGHT (CATHETERS) ×1 IMPLANT
DEVICE RAD COMP TR BAND LRG (VASCULAR PRODUCTS) ×1 IMPLANT
GLIDESHEATH SLEND SS 6F .021 (SHEATH) ×1 IMPLANT
GUIDEWIRE .025 260CM (WIRE) ×1 IMPLANT
GUIDEWIRE INQWIRE 1.5J.035X260 (WIRE) IMPLANT
INQWIRE 1.5J .035X260CM (WIRE) ×2
KIT ENCORE 26 ADVANTAGE (KITS) ×1 IMPLANT
KIT HEART LEFT (KITS) ×2 IMPLANT
PACK CARDIAC CATHETERIZATION (CUSTOM PROCEDURE TRAY) ×2 IMPLANT
SHEATH PINNACLE 7F 10CM (SHEATH) ×1 IMPLANT
TRANSDUCER W/STOPCOCK (MISCELLANEOUS) ×2 IMPLANT
TUBING CIL FLEX 10 FLL-RA (TUBING) ×2 IMPLANT

## 2020-09-01 NOTE — ED Triage Notes (Signed)
Patient BIB [facility] from home for chest pain, states pain started yesterday and was intermittent in nature, now constant pain, states in his L chest radiating into his arm and jaw, took nitro without relief.

## 2020-09-01 NOTE — Progress Notes (Signed)
ANTICOAGULATION CONSULT NOTE - Initial Consult  Pharmacy Consult for Heparin Indication: chest pain/ACS  Allergies  Allergen Reactions  . Coreg [Carvedilol] Nausea And Vomiting and Other (See Comments)    Per patient made him dizzy and light sensitive  . Sulfa Antibiotics Nausea And Vomiting and Other (See Comments)    Also headaches     Patient Measurements: Height: 5\' 8"  (172.7 cm) Weight: 63.8 kg (140 lb 10.5 oz) IBW/kg (Calculated) : 68.4 Vital Signs: Temp: 97.8 F (36.6 C) (03/12 0113) Temp Source: Oral (03/12 0113) BP: 114/78 (03/12 1110) Pulse Rate: 69 (03/12 1110)  Labs: Recent Labs    09/01/20 0117 09/01/20 0312 09/01/20 0437 09/01/20 1058  HGB 13.6  --  12.9*  --   HCT 43.2  --  39.4  --   PLT 263  --  263  --   LABPROT  --   --  13.6  --   INR  --   --  1.1  --   HEPARINUNFRC  --   --   --  0.17*  CREATININE 1.36*  --  1.29*  --   TROPONINIHS 10,422* 11,585*  --   --     Estimated Creatinine Clearance: 49.5 mL/min (A) (by C-G formula based on SCr of 1.29 mg/dL (H)).   Medical History: Past Medical History:  Diagnosis Date  . Anxiety    off xanax  and paxil since 3/13  . Arthritis   . Bilateral carotid artery disease ([place])    s/p R ICA stent 03/28/2015 with distal protection. Known chronically occluded L ICA. Carotid stent complicated by hypotension and acute stroke in [place]  . Cataract   . COPD (chronic obstructive pulmonary disease) ([place])   . Coronary artery disease    a. s/p CABG in 2011 with LIMA-LAD, SVG-RI/OM, and SVG-PDA b. occluded SVG-PDA by cath in 2015 c. 10/2016: NSTEMI with cath showing thrombus along the distal graft to insertion of SVG-OM2 with DES placed.   . CVA (cerebral infarction)    occured on 10/10 several days after R ICA carotid stenting  . Depression   . Diabetes mellitus   . GERD (gastroesophageal reflux disease)   . GERD with stricture   . High cholesterol   . Hx of adenomatous colonic polyps 05/2020    10 diminutive adenomas  . Hypertension    type 2 NIDDM  . Myocardial infarct (H[facility])    x4 last 10 yrs  . Pneumonia    hx  . 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 Skin cancer, basal cell    tumor basal cell rem from lft arm  . Smoker   . Substance abuse [facility])     Assessment: 70 y/o M with significant cardiac history presents to the ED with chest pain, found to have significantly elevated troponin, starting heparin, CBC ok, Scr 1.36, PTA meds reviewed.   Goal of Therapy:  Heparin level 0.3-0.7 units/ml Monitor platelets by anticoagulation protocol: Yes   Plan:  - Heparin level subtherapeutic at 0.17 - Will bolus with Heparin 2000 units IV x 1 dose  - Increase heparin drip to 1050 units/hr  - Recheck HL in ~ 6 hours  - Monitor patient for s/s of bleeding and daily CBC/Heparin level   Duanne Limerick, PharmD, [place] Pharmacist Phone: [phone]

## 2020-09-01 NOTE — Progress Notes (Signed)
Patient with worsening chest pain despite increasing nitro. SBPs low 100s. Discussed with Dr. Ellyn Hack. Will plan for cath now. Patient made NPO and amenable to proceed. Risks and benefits discussed with him at length at bedside as detailed below.  INFORMED CONSENT: I have reviewed the risks, indications, and alternatives to cardiac catheterization, possible angioplasty, and stenting with the patient. Risks include but are not limited to bleeding, infection, vascular injury, stroke, myocardial infection, arrhythmia, kidney injury, radiation-related injury in the case of prolonged fluoroscopy use, emergency cardiac surgery, and death. The patient understands the risks of serious complication is 1-2 in 3414 with diagnostic cardiac cath and 1-2% or less with angioplasty/stenting.   Gwyndolyn Kaufman, MD

## 2020-09-01 NOTE — Interval H&P Note (Signed)
History and Physical Interval Note:  09/01/2020 2:01 PM  Jason [name]  has presented today for surgery, with the diagnosis of Urgent Non-STEMI.  The various methods of treatment have been discussed with the patient and family. After consideration of risks, benefits and other options for treatment, the patient has consented to  Procedure(s): Coronary/Graft Acute MI Revascularization (N/A) LEFT HEART CATH AND CORONARY ANGIOGRAPHY (N/A)  PERCUTANEOUS CORONARY INTERVENTION  as a surgical intervention.  The patient's history has been reviewed, patient examined, no change in status, stable for surgery.  I have reviewed the patient's chart and labs.  Questions were answered to the patient's satisfaction.    Cath Lab Visit (complete for each Cath Lab visit)  Clinical Evaluation Leading to the Procedure:   ACS: Yes.    Non-ACS:    Anginal Classification: CCS IV  Anti-ischemic medical therapy: Maximal Therapy (2 or more classes of medications)  Non-Invasive Test Results: No non-invasive testing performed  Prior CABG: Previous CABG    Glenetta Hew

## 2020-09-01 NOTE — ED Notes (Signed)
md notified of critical troponin 10,422

## 2020-09-01 NOTE — H&P (Addendum)
Cardiology Admission History and Physical:   Patient ID: KY RUMPLE MRN: [id number]; DOB: Apr 09, 1951   Admission date: 09/01/2020  Primary Care Provider: Horald Pollen, MD Primary Cardiologist: Shelva Majestic, MD  Primary Electrophysiologist:  None   Chief Complaint:  Chest pain  Patient Profile:   LENNIS KORB is a 70 y.o. male with CAD s/p CABG in 2011 and PCI to SVG in 2018, PAD, HTN, HLD, T2DM, hypothyroidism who presents with chest pain and found to have NSTEMI  History of Present Illness:   Mr. Kirkendall with CAD s/p CABG in 2011 and PCI to SVG in 2018, PAD, HTN, HLD, T2DM, hypothyroidism who presents with chest pain that radiates to the let arm and to the jaw. He notes that this pain is exactly the same as his prior MI. He developed diaphoresis and cold sweats as well.  First started 2 days prior to arrival and was intermittent, however progressed and is now constant. He was brought in by EMS for further evaluation. Of note, he still is smoking 1PPD of cigarettes.   Initial evaluation in the ED had an ECG with nonspecific changes and no ST elevation. WBC 15.3, sCr 1.36, hsTnI 10422 on first check. Still having chest pain.   He was given full dose aspirin load, anticoagulation with heparin and started on nitroglycerin infusion by the ED.    Past Medical History:  Diagnosis Date  . Anxiety    off xanax  and paxil since 3/13  . Arthritis   . Bilateral carotid artery disease ([place])    s/p R ICA stent 03/28/2015 with distal protection. Known chronically occluded L ICA. Carotid stent complicated by hypotension and acute stroke in [place]  . Cataract   . COPD (chronic obstructive pulmonary disease) ([place])   . Coronary artery disease    a. s/p CABG in 2011 with LIMA-LAD, SVG-RI/OM, and SVG-PDA b. occluded SVG-PDA by cath in 2015 c. 10/2016: NSTEMI with cath showing thrombus along the distal graft to insertion of SVG-OM2 with DES placed.   . CVA (cerebral  infarction)    occured on 10/10 several days after R ICA carotid stenting  . Depression   . Diabetes mellitus   . GERD (gastroesophageal reflux disease)   . GERD with stricture   . High cholesterol   . Hx of adenomatous colonic polyps 05/2020   10 diminutive adenomas  . Hypertension    type 2 NIDDM  . Myocardial infarct (HCC)    x4 last 10 yrs  . Pneumonia    hx  . 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 Skin cancer, basal cell    tumor basal cell rem from lft arm  . Smoker   . Substance abuse [facility])     Past Surgical History:  Procedure Laterality Date  . ABDOMINAL AORTOGRAM W/LOWER EXTREMITY Bilateral 05/31/2019   Procedure: ABDOMINAL AORTOGRAM W/LOWER EXTREMITY;  Surgeon: Waynetta Sandy, MD;  Location: [place] CV LAB;  Service: Cardiovascular;  Laterality: Bilateral;  . AMPUTATION TOE Right 06/02/2019   Procedure: Amputation Right Great Toe and Second Toe;  Surgeon: Waynetta Sandy, MD;  Location: [place];  Service: Vascular;  Laterality: Right;  . BACK SURGERY  Jan 2014, Dec 2011   Dr Vertell Limber  . BALLOON DILATION N/A 06/13/2020   Procedure: BALLOON DILATION;  Surgeon: Gatha Mayer, MD;  Location: [facility] ENDOSCOPY;  Service: Endoscopy;  Laterality: N/A;  . BIOPSY  06/13/2020   Procedure: BIOPSY;  Surgeon: Gatha Mayer, MD;  Location: [facility] ENDOSCOPY;  Service: Endoscopy;;  . BYPASS GRAFT FEMORAL-PERONEAL Left 11/29/2019   Procedure: Left Bypass Graft Femoral-Peroneal using non reversed Greater Sapphenous vein;  Surgeon: Waynetta Sandy, MD;  Location: [place];  Service: Vascular;  Laterality: Left;  . CARDIAC CATHETERIZATION  4/12   Medical Rx  . COLONOSCOPY WITH PROPOFOL N/A 06/13/2020   Procedure: COLONOSCOPY WITH PROPOFOL;  Surgeon: Gatha Mayer, MD;  Location: [facility] ENDOSCOPY;  Service: Endoscopy;  Laterality: N/A;  . CORONARY ANGIOGRAM  01/17/14   med rx  . CORONARY ANGIOGRAM  4/13   med Rx  . CORONARY  ANGIOGRAM  1/15   Med Rx  . CORONARY ANGIOPLASTY  Jan 2004   RCA  . CORONARY ARTERY BYPASS GRAFT  07/24/2009   L-LAD, SVG-RI/OM, SVG-PDA  . CORONARY STENT INTERVENTION N/A 10/23/2016   Procedure: Coronary Stent Intervention;  Surgeon: Peter M Martinique, MD;  Location: [place] [facility] LAB;  Service: Cardiovascular;  Laterality: N/A;  . ENDARTERECTOMY FEMORAL Right 06/02/2019   Procedure: Endarterectomy External Iliac  Femoral Artery and Profunda;  Surgeon: Waynetta Sandy, MD;  Location: [place];  Service: Vascular;  Laterality: Right;  . ESOPHAGOGASTRODUODENOSCOPY (EGD) WITH PROPOFOL N/A 06/13/2020   Procedure: ESOPHAGOGASTRODUODENOSCOPY (EGD) WITH PROPOFOL;  Surgeon: Gatha Mayer, MD;  Location: [place];  Service: Endoscopy;  Laterality: N/A;  . EYE SURGERY     cat bil  . FEMORAL ARTERY - FEMORAL ARTERY BYPASS GRAFT Right 2004   femoral enarterectomy  . FEMORAL-POPLITEAL BYPASS GRAFT Right 06/02/2019   Procedure: RIGHT LEG BYPASS GRAFT FEMORAL-POPLITEAL ARTERY using Gore Propaten Vascular Graft Removable Ring;  Surgeon: Waynetta Sandy, MD;  Location: [place];  Service: Vascular;  Laterality: Right;  . LEFT HEART CATH AND CORS/GRAFTS ANGIOGRAPHY N/A 10/23/2016   Procedure: Left Heart Cath and Cors/Grafts Angiography;  Surgeon: Peter M Martinique, MD;  Location: [place] [facility];  Service: Cardiovascular;  Laterality: N/A;  . LEFT HEART CATHETERIZATION WITH CORONARY ANGIOGRAM N/A 10/03/2011   Procedure: LEFT HEART CATHETERIZATION WITH CORONARY ANGIOGRAM;  Surgeon: Pixie Casino, MD;  Location: [facility] CATH LAB;  Service: Cardiovascular;  Laterality: N/A;  . LEFT HEART CATHETERIZATION WITH CORONARY ANGIOGRAM N/A 07/08/2013   Procedure: LEFT HEART CATHETERIZATION WITH CORONARY ANGIOGRAM;  Surgeon: Blane Ohara, MD;  Location: [facility] CATH LAB;  Service: Cardiovascular;  Laterality: N/A;  . LEFT HEART CATHETERIZATION WITH CORONARY/GRAFT ANGIOGRAM N/A 01/17/2014   Procedure: LEFT HEART  CATHETERIZATION WITH Beatrix Fetters;  Surgeon: Troy Sine, MD;  Location: [facility] CATH LAB;  Service: Cardiovascular;  Laterality: N/A;  . LOWER EXTREMITY ANGIOGRAPHY N/A 11/28/2019   Procedure: LOWER EXTREMITY ANGIOGRAPHY;  Surgeon: Waynetta Sandy, MD;  Location: [place] [facility] LAB;  Service: Cardiovascular;  Laterality: N/A;  . PATCH ANGIOPLASTY Right 06/02/2019   Procedure: Patch Angioplasty using Hemashield Platinum Finesse Patch of the External Iliac Femoral Artery and Profunda;  Surgeon: Waynetta Sandy, MD;  Location: [place];  Service: Vascular;  Laterality: Right;  . PERIPHERAL VASCULAR BALLOON ANGIOPLASTY Right 11/28/2019   Procedure: PERIPHERAL VASCULAR BALLOON ANGIOPLASTY;  Surgeon: Waynetta Sandy, MD;  Location: [place] [facility] LAB;  Service: Cardiovascular;  Laterality: Right;  Common femoral  . PERIPHERAL VASCULAR CATHETERIZATION N/A 03/28/2015   Procedure: Carotid PTA/Stent Intervention;  Surgeon: Lorretta Harp, MD;  Location: [place] [facility] LAB;  Service: Cardiovascular;  Laterality: N/A;  . PERIPHERAL VASCULAR INTERVENTION Left 11/28/2019   Procedure: PERIPHERAL VASCULAR INTERVENTION;  Surgeon: Donzetta Matters,  Georgia Dom, MD;  Location: [place] CV LAB;  Service: Cardiovascular;  Laterality: Left;  external iliac  . POLYPECTOMY  06/13/2020   Procedure: POLYPECTOMY;  Surgeon: Gatha Mayer, MD;  Location: [facility] ENDOSCOPY;  Service: Endoscopy;;  . US EXTREMITY*L*     lft arm tumor removed      Medications Prior to Admission: Prior to Admission medications   Medication Sig Start Date End Date Taking? Authorizing Provider  acetaminophen (TYLENOL) 650 MG CR tablet Take 1,950-2,600 mg by mouth 2 times daily at 12 noon and 4 pm.    [provider]  aspirin 81 MG tablet Take 1 tablet (81 mg total) by mouth daily. 10/26/16   Lyda Jester M, PA-C  clopidogrel (PLAVIX) 75 MG tablet TAKE 1 TABLET BY MOUTH EVERY DAY Patient taking  differently: Take 75 mg by mouth daily. 02/13/20   Troy Sine, MD  diphenhydrAMINE (BENADRYL) 25 MG tablet Take 75 mg by mouth at bedtime as needed for sleep.    [provider]  docusate sodium (COLACE) 100 MG capsule Take 100 mg by mouth 2 (two) times daily.    [provider]  ferrous sulfate 325 (65 FE) MG tablet Take 1 tablet (325 mg total) by mouth daily with breakfast. 12/08/19   Hosie Poisson, MD  gabapentin (NEURONTIN) 300 MG capsule Take 1 capsule (300 mg total) by mouth 2 (two) times daily. 12/07/19   Hosie Poisson, MD  isosorbide mononitrate (IMDUR) 120 MG 24 hr tablet Take 1 tablet (120 mg total) by mouth daily. 02/09/20   Troy Sine, MD  levothyroxine (SYNTHROID) 50 MCG tablet Take 1 tablet (50 mcg total) by mouth daily. 02/09/20   Troy Sine, MD  loratadine (CLARITIN) 10 MG tablet Take 10 mg by mouth daily.    [provider]  metoCLOPramide (REGLAN) 10 MG tablet Take 1 tablet (10 mg total) by mouth every 6 (six) hours. 06/06/20   Fatima Blank, MD  metoprolol tartrate (LOPRESSOR) 25 MG tablet Take 0.5 tablets (12.5 mg total) by mouth 2 (two) times daily. 12/07/19   Hosie Poisson, MD  nicotine (NICODERM CQ - DOSED IN MG/24 HOURS) 21 mg/24hr patch Place 1 patch (21 mg total) onto the skin daily. 06/15/20   Dagar, Meredith Staggers, MD  nitroGLYCERIN (NITROSTAT) 0.4 MG SL tablet PLACE 1 TABLET (0.4 MG TOTAL) UNDER THE TONGUE EVERY 5 (FIVE) MINUTES times 3 then call MD Patient taking differently: Place 0.4 mg under the tongue every 5 (five) minutes as needed for chest pain. 08/29/19   Kroeger, Lorelee Cover., PA-C  pantoprazole (PROTONIX) 40 MG tablet Take 1 tablet (40 mg total) by mouth 2 (two) times daily before a meal. 06/14/20   Dagar, Meredith Staggers, MD  ranolazine (RANEXA) 1000 MG SR tablet TAKE 1 TABLET BY MOUTH 2 TIMES DAILY. Patient taking differently: Take 1,000 mg by mouth 2 (two) times daily. 01/25/20   Troy Sine, MD  rosuvastatin (CRESTOR) 20 MG tablet  Take 1 tablet (20 mg total) by mouth daily. 08/29/19   Kroeger, Lorelee Cover., PA-C  sitaGLIPtin-metformin (JANUMET) 50-500 MG tablet TAKE 1 TABLET BY MOUTH TWICE A DAY WITH A MEAL. Patient taking differently: Take 1 tablet by mouth 2 (two) times daily with a meal. 01/30/20   Troy Sine, MD  vitamin B-12 1000 MCG tablet Take 1 tablet (1,000 mcg total) by mouth daily. 12/08/19   Hosie Poisson, MD     Allergies:    Allergies  Allergen Reactions  . Coreg [  Carvedilol] Nausea And Vomiting and Other (See Comments)    Per patient made him dizzy and light sensitive  . Sulfa Antibiotics Nausea And Vomiting and Other (See Comments)    Also headaches     Social History:   Social History   Socioeconomic History  . Marital status: Single    Spouse name: Not on file  . Number of children: 1  . Years of education: 36  . Highest education level: Not on file  Occupational History  . Occupation: welder-fabricator  Tobacco Use  . Smoking status: Current Every Day Smoker    Packs/day: 1.00    Years: 48.00    Pack years: 48.00    Types: Cigarettes  . Smokeless tobacco: Never Used  Vaping Use  . Vaping Use: Never used  Substance and Sexual Activity  . Alcohol use: Yes    Alcohol/week: 0.0 standard drinks    Comment: socially  . Drug use: Not Currently  . Sexual activity: Not on file  Other Topics Concern  . Not on file  Social History Narrative   Lives alone   Caffeine use: Drinks pepsi/coffee (32 oz diet pepsi per day)    Exercise walking 4 times per week for 1 mile   Current PPD smoker.    Social Determinants of Health   Financial Resource Strain: Not on file  Food Insecurity: Not on file  Transportation Needs: Not on file  Physical Activity: Not on file  Stress: Not on file  Social Connections: Not on file  Intimate Partner Violence: Not on file    Family History:   The patient's family history includes Arthritis in his mother; Heart disease in his father.    Review of  Systems: [y] = yes, [ ]  = no     General: Weight gain [ ] ; Weight loss [ ] ; Anorexia [ ] ; Fatigue [ ] ; Fever [ ] ; Chills [ ] ; Weakness [ ]    Cardiac: Chest pain/pressure Blue.Reese ]; Resting SOB [ ] ; Exertional SOB [ y]; Orthopnea [ ] ; Pedal Edema [ ] ; Palpitations [ ] ; Syncope [ ] ; Presyncope [ ] ; Paroxysmal nocturnal dyspnea[ ]    Pulmonary: Cough [ ] ; Wheezing[ ] ; Hemoptysis[ ] ; Sputum [ ] ; Snoring [ ]    GI: Vomiting[ ] ; Dysphagia[ ] ; Melena[ ] ; Hematochezia [ ] ; Heartburn[ ] ; Abdominal pain [ ] ; Constipation [ ] ; Diarrhea [ ] ; BRBPR [ ]    GU: Hematuria[ ] ; Dysuria [ ] ; Nocturia[ ]    Vascular: Pain in legs with walking Blue.Reese ]; Pain in feet with lying flat [ ] ; Non-healing sores [ ] ; Stroke [ ] ; TIA [ ] ; Slurred speech [ ] ;   Neuro: Headaches[ ] ; Vertigo[ ] ; Seizures[ ] ; Paresthesias[ ] ;Blurred vision [ ] ; Diplopia [ ] ; Vision changes [ ]    Ortho/Skin: Arthritis [ ] ; Joint pain [ ] ; Muscle pain [ ] ; Joint swelling [ ] ; Back Pain [ ] ; Rash [ ]    Psych: Depression[ ] ; Anxiety[ ]    Heme: Bleeding problems [ ] ; Clotting disorders [ ] ; Anemia [ ]    Endocrine: Diabetes [ ] ; Thyroid dysfunction[ ]   Physical Exam/Data:   Vitals:   09/01/20 0109 09/01/20 0113 09/01/20 0113  BP:   117/89  Pulse:   90  Resp:   (!) 21  Temp:  97.8 F (36.6 C)   TempSrc:  Oral   SpO2:   100%  Weight: 63.8 kg    Height: 5\' 8"  (1.727 m)     No intake or output data in the 24 hours  ending 09/01/20 0228 Filed Weights   09/01/20 0109  Weight: 63.8 kg   Body mass index is 21.39 kg/m.  General:  Elderly appearing male, no acute distress, some chest pain  HEENT: normal Lymph: no adenopathy Neck: no JVD Endocrine:  No thryomegaly Vascular: No carotid bruits; FA pulses 2+ bilaterally without bruits  Cardiac:  normal S1, S2; RRR; no murmur  Lungs: lung sounds clear but decreased throughout  Abd: soft, nontender, no hepatomegaly  Ext: no edema Musculoskeletal:  No deformities, BUE and BLE strength normal and  equal Skin: warm and dry  Neuro:  CNs 2-12 intact, no focal abnormalities noted Psych:  Normal affect    EKG:  The ECG that was done  was personally reviewed and demonstrates bifasicular block   Relevant CV Studies: LHC 10/2016: 1. Severe 3 vessel obstructive CAD. 2. Patent LIMA to the LAD 3. Patent SVG sequentially to OM1 and OM2 but with critical thrombotic lesion in the body of the SVG 4. Occluded SVG to the RCA- chronic 5. Moderately elevated LVEDP 6. Successful stenting of SVG to OM 2. Procedure complicated by no reflow phenomena and occlusion of the first OM.    Laboratory Data:  Chemistry Recent Labs  Lab 09/01/20 0117  NA 134*  K 3.9  CL 106  CO2 19*  GLUCOSE 137*  BUN 12  CREATININE 1.36*  CALCIUM 9.1  GFRNONAA 57*  ANIONGAP 9    No results for input(s): PROT, ALBUMIN, AST, ALT, ALKPHOS, BILITOT in the last 168 hours. Hematology Recent Labs  Lab 09/01/20 0117  WBC 15.3*  RBC 4.33  HGB 13.6  HCT 43.2  MCV 99.8  MCH 31.4  MCHC 31.5  RDW 17.1*  PLT 263   Cardiac EnzymesNo results for input(s): TROPONINI in the last 168 hours. No results for input(s): TROPIPOC in the last 168 hours.  BNPNo results for input(s): BNP, PROBNP in the last 168 hours.  DDimer No results for input(s): DDIMER in the last 168 hours.  Radiology/Studies:  DG Chest 2 View  Result Date: 09/01/2020 CLINICAL DATA:  Chest pain EXAM: CHEST - 2 VIEW COMPARISON:  12/02/2019 FINDINGS: The heart size and mediastinal contours are within normal limits. Both lungs are clear. The visualized skeletal structures are unremarkable. IMPRESSION: No active cardiopulmonary disease. Electronically Signed   By: Ulyses Jarred M.D.   On: 09/01/2020 01:39    Assessment and Plan:   1. NSTEMI. Has known severe native CAD with CABG that is >10 years. On [place] in 2018, LIMA to LAD was patent however SVG to OM1/OM2 had to be intervenened. SVG to RCA was chronically occluded. Worry that one of the grafts has  occluded given symptoms and marked troponin elevation.  1. Load ASA, continue plavix, start heparin 2. Continue high dose statin 3. Nitroglycerin gtt for pain; hold oral nitro for now  4. Continue ranolazine 5. Continue BB, hold ace for now  6. Echo ordered  7. Will need LHC  2. T2DM. Hold metform. SSI 3. HTN. BB as above and nitro 4. HLD. Statin as above 5. Hypothyroidism. Continue synthroid    Severity of Illness: The appropriate patient status for this patient is INPATIENT. Inpatient status is judged to be reasonable and necessary in order to provide the required intensity of service to ensure the patient's safety. The patient's presenting symptoms, physical exam findings, and initial radiographic and laboratory data in the context of their chronic comorbidities is felt to place them at high risk for further clinical deterioration.  Furthermore, it is not anticipated that the patient will be medically stable for discharge from the hospital within 2 midnights of admission. The following factors support the patient status of inpatient.   " The patient's presenting symptoms include chest pain. " The worrisome physical exam findings include chest pain. " The initial radiographic and laboratory data are worrisome because of troponin 10400. " The chronic co-morbidities include PAD, diabetes, smoking .   * I certify that at the point of admission it is my clinical judgment that the patient will require inpatient hospital care spanning beyond 2 midnights from the point of admission due to high intensity of service, high risk for further deterioration and high frequency of surveillance required.*    For questions or updates, please contact [place] Please consult [email or web address] for contact info under        Signed, Doyne Keel, MD  09/01/2020 2:28 AM   Patient seen and examined and agree with Doyne Keel, MD as above.  In brief, the patient is a 70 year old male with  extensive history of CAD s/p CABG in 2011 and PCI to SVG in 2018, PAD, carotid artery stenosis s/p stenting, HTN, HLD, T2DM, hypothyroidism and current tobacco use who presented with chest pain found to have NSTEMI with trop 10,422-->11,585.  Patient states that he initially developed substernal chest pressure radiating to the jaw and down the left arm on Thursday night. Pain was similar to the angina that he has had with his prior myocardial infarctions in the past. Initially intermittent in nature, but then became more constant prompting him to come to the ER. ECG here with NSR, RBBB, LFPB, non-specific ST changes but no STE/significant STD. Was placed on nitro gtt and pain is now significantly improved. Remains HD stable.   Last cath 09/12/2016:  Prox RCA to Mid RCA lesion, 100 %stenosed.  Ost LAD to Dist LAD lesion, 75 %stenosed.  1st Diag lesion, 50 %stenosed.  Ost 1st Mrg to 1st Mrg lesion, 100 %stenosed.  Ost Cx to Prox Cx lesion, 99 %stenosed.  Ost 2nd Mrg to 2nd Mrg lesion, 100 %stenosed.  SVG.  Origin lesion, 100 %stenosed.  Seq SVG-.  LIMA graft was visualized by angiography and is normal in caliber and anatomically normal.  LV end diastolic pressure is moderately elevated.  A STENT RESOLUTE ONYX 4.5X18 drug eluting stent was successfully placed.  Dist Graft to Insertion lesion, 95 %stenosed.  Post intervention, there is a 0% residual stenosis.   1. Severe 3 vessel obstructive CAD. 2. Patent LIMA to the LAD 3. Patent SVG sequentially to OM1 and OM2 but with critical thrombotic lesion in the body of the SVG 4. Occluded SVG to the RCA- chronic 5. Moderately elevated LVEDP 6. Successful stenting of SVG to OM 2. Procedure complicated by no reflow phenomena and occlusion of the first OM.    Exam: GEN: No acute distress. Resting comfortbaly Neck: No JVD Cardiac: RRR, 2/6 systolic murmur best heart at RUSB Respiratory: Clear to auscultation bilaterally. GI: Soft,  nontender, non-distended  MS: No edema; No deformity. Neuro:  Nonfocal  Psych: Normal affect  Plan: -Continue ASA, plavix and heparin -Continue crestor -Continue nitro gtt and ranolazine -Given that the patient's pain is significantly improved and patient is comfortable, no ST changes on ECG, trop rise is not overly significant and patient's initial pain developed on Thursday, no need for emergent catheterization now--will plan on cath Monday unless clinical change (Discussed with STEMI attending, Dr. Ellyn Hack) -Continue  BB; add ACE/ARB post-cath as able -Follow-up TTE -Okay for diet   Gwyndolyn Kaufman, MD

## 2020-09-01 NOTE — ED Notes (Signed)
Pt updated on plan of care, informed that the provider has changed his diet to NPO at this time due to wanting to potentially take him to the cath lab this afternoon Provider states they will order some PRN pain medications since BP is to low to titrate Nitro higher at this time   Pt states understanding of new plan of care Continues to c/o 8/10 chest pain describes at twisting, will give morphine once verified.

## 2020-09-01 NOTE — ED Provider Notes (Signed)
[place] EMERGENCY DEPARTMENT Provider Note   CSN: [id number] Arrival date & time: 09/01/20  0106     History Chief Complaint  Patient presents with  . Chest Pain    Jason [name] is a 70 y.o. male.  70 yo M with a chief complaint of left-sided chest pain that radiates to the left arm the jaw.  Patient has had multiple heart attacks in the past and thinks this feels exactly the same.  He has been taking his home medications for this but have not had significant improvement.  Estimates that his pain is between a 6 and 9 throughout the day.  Seeming to come and go.  He actually finished his bottle of nitroglycerin and went to the pharmacy and was unable to get it filled without seeing his doctor and so he decided come to the ED.  The history is provided by the patient.  Chest Pain Pain location:  L chest Pain quality: aching   Pain radiates to:  Does not radiate Pain severity:  Moderate Onset quality:  Gradual Duration:  2 days Timing:  Constant Progression:  Worsening Chronicity:  New Relieved by:  Nothing Worsened by:  Nothing Ineffective treatments:  None tried Associated symptoms: no abdominal pain, no fever, no headache, no palpitations, no shortness of breath and no vomiting        Past Medical History:  Diagnosis Date  . Anxiety    off xanax  and paxil since 3/13  . Arthritis   . Bilateral carotid artery disease ([place])    s/p R ICA stent 03/28/2015 with distal protection. Known chronically occluded L ICA. Carotid stent complicated by hypotension and acute stroke in [place]  . Cataract   . COPD (chronic obstructive pulmonary disease) ([place])   . Coronary artery disease    a. s/p CABG in 2011 with LIMA-LAD, SVG-RI/OM, and SVG-PDA b. occluded SVG-PDA by cath in 2015 c. 10/2016: NSTEMI with cath showing thrombus along the distal graft to insertion of SVG-OM2 with DES placed.   . CVA (cerebral infarction)    occured on 10/10 several  days after R ICA carotid stenting  . Depression   . Diabetes mellitus   . GERD (gastroesophageal reflux disease)   . GERD with stricture   . High cholesterol   . Hx of adenomatous colonic polyps 05/2020   10 diminutive adenomas  . Hypertension    type 2 NIDDM  . Myocardial infarct (HCC)    x4 last 10 yrs  . Pneumonia    hx  . 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 Skin cancer, basal cell    tumor basal cell rem from lft arm  . Smoker   . Substance abuse [facility])     Patient Active Problem List   Diagnosis Date Noted  . Malnutrition of moderate degree 06/13/2020  . Benign neoplasm of ascending colon   . Benign neoplasm of transverse colon   . Benign neoplasm of sigmoid colon   . Lower esophageal ring   . Dysphagia   . Antiplatelet or antithrombotic long-term use   . Dehydration   . Emesis   . Diarrhea   . AKI (acute kidney injury) ([place]) 06/06/2020  . Hx of adenomatous colonic polyps 05/2020  . Pressure injury of skin 12/04/2019  . Critical lower limb ischemia ([place]) 11/26/2019  . Wound of lower extremity 11/26/2019  . Toe fracture, right 11/26/2019  . Amputation of right great  toe ([place]) 07/11/2019  . Nicotine abuse 06/12/2019  . Cellulitis of right foot 06/11/2019  . Diabetic foot infection ([place]) 06/11/2019  . PAD (peripheral artery disease) ([place]) 06/11/2019  . Cellulitis 05/27/2019  . Gangrene of toe ([place]) 05/27/2019  . Status post lumbar spinal fusion 10/27/2017  . Acute on chronic combined systolic and diastolic CHF (congestive heart failure) ([place]) 10/25/2016  . NSTEMI (non-ST elevated myocardial infarction) ([place]) 10/22/2016  . Hypothyroidism 08/17/2015  . RBBB 05/11/2015  . Hyponatremia 04/04/2015  . Hypotension 04/04/2015  . Hemispheric carotid artery syndrome   . Carotid artery narrowing 03/28/2015  . Carotid stenosis- moderate 2011, 95% 2016 s/p stent 02/21/2014  . Unstable angina ([place]) 01/16/2014  . Tobacco abuse  01/16/2014  . Substance abuse in remission ([place]) 10/01/2012  . Radiculitis 02/10/2012  . CAD, CABG Feb 2011, cath x 4 since-medical Rx 10/03/2011  . PVD, hx Rt femoral endarterectomy 2004 10/03/2011  . DM (diabetes mellitus) ([place]) 10/02/2011  . HTN (hypertension) 10/02/2011  . Hyperlipidemia 10/02/2011    Past Surgical History:  Procedure Laterality Date  . ABDOMINAL AORTOGRAM W/LOWER EXTREMITY Bilateral 05/31/2019   Procedure: ABDOMINAL AORTOGRAM W/LOWER EXTREMITY;  Surgeon: Waynetta Sandy, MD;  Location: [place] CV LAB;  Service: Cardiovascular;  Laterality: Bilateral;  . AMPUTATION TOE Right 06/02/2019   Procedure: Amputation Right Great Toe and Second Toe;  Surgeon: Waynetta Sandy, MD;  Location: [place];  Service: Vascular;  Laterality: Right;  . BACK SURGERY  Jan 2014, Dec 2011   Dr Vertell Limber  . BALLOON DILATION N/A 06/13/2020   Procedure: BALLOON DILATION;  Surgeon: Gatha Mayer, MD;  Location: [facility] ENDOSCOPY;  Service: Endoscopy;  Laterality: N/A;  . BIOPSY  06/13/2020   Procedure: BIOPSY;  Surgeon: Gatha Mayer, MD;  Location: [facility] ENDOSCOPY;  Service: Endoscopy;;  . BYPASS GRAFT FEMORAL-PERONEAL Left 11/29/2019   Procedure: Left Bypass Graft Femoral-Peroneal using non reversed Greater Sapphenous vein;  Surgeon: Waynetta Sandy, MD;  Location: [place];  Service: Vascular;  Laterality: Left;  . CARDIAC CATHETERIZATION  4/12   Medical Rx  . COLONOSCOPY WITH PROPOFOL N/A 06/13/2020   Procedure: COLONOSCOPY WITH PROPOFOL;  Surgeon: Gatha Mayer, MD;  Location: [facility] ENDOSCOPY;  Service: Endoscopy;  Laterality: N/A;  . CORONARY ANGIOGRAM  01/17/14   med rx  . CORONARY ANGIOGRAM  4/13   med Rx  . CORONARY ANGIOGRAM  1/15   Med Rx  . CORONARY ANGIOPLASTY  Jan 2004   RCA  . CORONARY ARTERY BYPASS GRAFT  07/24/2009   L-LAD, SVG-RI/OM, SVG-PDA  . CORONARY STENT INTERVENTION N/A 10/23/2016   Procedure: Coronary Stent Intervention;  Surgeon: Peter M Martinique,  MD;  Location: [place] CV LAB;  Service: Cardiovascular;  Laterality: N/A;  . ENDARTERECTOMY FEMORAL Right 06/02/2019   Procedure: Endarterectomy External Iliac  Femoral Artery and Profunda;  Surgeon: Waynetta Sandy, MD;  Location: [place];  Service: Vascular;  Laterality: Right;  . ESOPHAGOGASTRODUODENOSCOPY (EGD) WITH PROPOFOL N/A 06/13/2020   Procedure: ESOPHAGOGASTRODUODENOSCOPY (EGD) WITH PROPOFOL;  Surgeon: Gatha Mayer, MD;  Location: [place];  Service: Endoscopy;  Laterality: N/A;  . EYE SURGERY     cat bil  . FEMORAL ARTERY - FEMORAL ARTERY BYPASS GRAFT Right 2004   femoral enarterectomy  . FEMORAL-POPLITEAL BYPASS GRAFT Right 06/02/2019   Procedure: RIGHT LEG BYPASS GRAFT FEMORAL-POPLITEAL ARTERY using Gore Propaten Vascular Graft Removable Ring;  Surgeon: Waynetta Sandy, MD;  Location: [place];  Service: Vascular;  Laterality: Right;  .  LEFT HEART CATH AND CORS/GRAFTS ANGIOGRAPHY N/A 10/23/2016   Procedure: Left Heart Cath and Cors/Grafts Angiography;  Surgeon: Peter M Martinique, MD;  Location: [place] CV LAB;  Service: Cardiovascular;  Laterality: N/A;  . LEFT HEART CATHETERIZATION WITH CORONARY ANGIOGRAM N/A 10/03/2011   Procedure: LEFT HEART CATHETERIZATION WITH CORONARY ANGIOGRAM;  Surgeon: Pixie Casino, MD;  Location: [facility] CATH LAB;  Service: Cardiovascular;  Laterality: N/A;  . LEFT HEART CATHETERIZATION WITH CORONARY ANGIOGRAM N/A 07/08/2013   Procedure: LEFT HEART CATHETERIZATION WITH CORONARY ANGIOGRAM;  Surgeon: Blane Ohara, MD;  Location: [facility] CATH LAB;  Service: Cardiovascular;  Laterality: N/A;  . LEFT HEART CATHETERIZATION WITH CORONARY/GRAFT ANGIOGRAM N/A 01/17/2014   Procedure: LEFT HEART CATHETERIZATION WITH Beatrix Fetters;  Surgeon: Troy Sine, MD;  Location: [facility] CATH LAB;  Service: Cardiovascular;  Laterality: N/A;  . LOWER EXTREMITY ANGIOGRAPHY N/A 11/28/2019   Procedure: LOWER EXTREMITY ANGIOGRAPHY;  Surgeon: Waynetta Sandy, MD;  Location: [place] CV LAB;  Service: Cardiovascular;  Laterality: N/A;  . PATCH ANGIOPLASTY Right 06/02/2019   Procedure: Patch Angioplasty using Hemashield Platinum Finesse Patch of the External Iliac Femoral Artery and Profunda;  Surgeon: Waynetta Sandy, MD;  Location: [place];  Service: Vascular;  Laterality: Right;  . PERIPHERAL VASCULAR BALLOON ANGIOPLASTY Right 11/28/2019   Procedure: PERIPHERAL VASCULAR BALLOON ANGIOPLASTY;  Surgeon: Waynetta Sandy, MD;  Location: [place] CV LAB;  Service: Cardiovascular;  Laterality: Right;  Common femoral  . PERIPHERAL VASCULAR CATHETERIZATION N/A 03/28/2015   Procedure: Carotid PTA/Stent Intervention;  Surgeon: Lorretta Harp, MD;  Location: [place] CV LAB;  Service: Cardiovascular;  Laterality: N/A;  . PERIPHERAL VASCULAR INTERVENTION Left 11/28/2019   Procedure: PERIPHERAL VASCULAR INTERVENTION;  Surgeon: Waynetta Sandy, MD;  Location: [place] CV LAB;  Service: Cardiovascular;  Laterality: Left;  external iliac  . POLYPECTOMY  06/13/2020   Procedure: POLYPECTOMY;  Surgeon: Gatha Mayer, MD;  Location: [facility] ENDOSCOPY;  Service: Endoscopy;;  . US EXTREMITY*L*     lft arm tumor removed        Family History  Problem Relation Age of Onset  . Arthritis Mother   . Heart disease Father     Social History   Tobacco Use  . Smoking status: Current Every Day Smoker    Packs/day: 1.00    Years: 48.00    Pack years: 48.00    Types: Cigarettes  . Smokeless tobacco: Never Used  Vaping Use  . Vaping Use: Never used  Substance Use Topics  . Alcohol use: Yes    Alcohol/week: 0.0 standard drinks    Comment: socially  . Drug use: Not Currently    Home Medications Prior to Admission medications   Medication Sig Start Date End Date Taking? Authorizing Provider  acetaminophen (TYLENOL) 650 MG CR tablet Take 1,950-2,600 mg by mouth 2 times daily at 12 noon and 4 pm.    [provider]  aspirin 81 MG tablet Take 1 tablet (81 mg total) by mouth daily. 10/26/16   Lyda Jester M, PA-C  clopidogrel (PLAVIX) 75 MG tablet TAKE 1 TABLET BY MOUTH EVERY DAY Patient taking differently: Take 75 mg by mouth daily. 02/13/20   Troy Sine, MD  diphenhydrAMINE (BENADRYL) 25 MG tablet Take 75 mg by mouth at bedtime as needed for sleep.    [provider]  docusate sodium (COLACE) 100 MG capsule Take 100 mg by mouth 2 (two) times daily.    [provider]  ferrous sulfate 325 (65 FE) MG tablet Take 1 tablet (325 mg total) by mouth daily with breakfast. 12/08/19   Hosie Poisson, MD  gabapentin (NEURONTIN) 300 MG capsule Take 1 capsule (300 mg total) by mouth 2 (two) times daily. 12/07/19   Hosie Poisson, MD  isosorbide mononitrate (IMDUR) 120 MG 24 hr tablet Take 1 tablet (120 mg total) by mouth daily. 02/09/20   Troy Sine, MD  levothyroxine (SYNTHROID) 50 MCG tablet Take 1 tablet (50 mcg total) by mouth daily. 02/09/20   Troy Sine, MD  loratadine (CLARITIN) 10 MG tablet Take 10 mg by mouth daily.    [provider]  metoCLOPramide (REGLAN) 10 MG tablet Take 1 tablet (10 mg total) by mouth every 6 (six) hours. 06/06/20   Fatima Blank, MD  metoprolol tartrate (LOPRESSOR) 25 MG tablet Take 0.5 tablets (12.5 mg total) by mouth 2 (two) times daily. 12/07/19   Hosie Poisson, MD  nicotine (NICODERM CQ - DOSED IN MG/24 HOURS) 21 mg/24hr patch Place 1 patch (21 mg total) onto the skin daily. 06/15/20   Dagar, Meredith Staggers, MD  nitroGLYCERIN (NITROSTAT) 0.4 MG SL tablet PLACE 1 TABLET (0.4 MG TOTAL) UNDER THE TONGUE EVERY 5 (FIVE) MINUTES times 3 then call MD Patient taking differently: Place 0.4 mg under the tongue every 5 (five) minutes as needed for chest pain. 08/29/19   Kroeger, Lorelee Cover., PA-C  pantoprazole (PROTONIX) 40 MG tablet Take 1 tablet (40 mg total) by mouth 2 (two) times daily before a meal. 06/14/20   Dagar, Meredith Staggers, MD  ranolazine (RANEXA) 1000  MG SR tablet TAKE 1 TABLET BY MOUTH 2 TIMES DAILY. Patient taking differently: Take 1,000 mg by mouth 2 (two) times daily. 01/25/20   Troy Sine, MD  rosuvastatin (CRESTOR) 20 MG tablet Take 1 tablet (20 mg total) by mouth daily. 08/29/19   Kroeger, Lorelee Cover., PA-C  sitaGLIPtin-metformin (JANUMET) 50-500 MG tablet TAKE 1 TABLET BY MOUTH TWICE A DAY WITH A MEAL. Patient taking differently: Take 1 tablet by mouth 2 (two) times daily with a meal. 01/30/20   Troy Sine, MD  vitamin B-12 1000 MCG tablet Take 1 tablet (1,000 mcg total) by mouth daily. 12/08/19   Hosie Poisson, MD    Allergies    Coreg [carvedilol] and Sulfa antibiotics  Review of Systems   Review of Systems  Constitutional: Negative for chills and fever.  HENT: Negative for congestion and facial swelling.   Eyes: Negative for discharge and visual disturbance.  Respiratory: Negative for shortness of breath.   Cardiovascular: Positive for chest pain. Negative for palpitations.  Gastrointestinal: Negative for abdominal pain, diarrhea and vomiting.  Musculoskeletal: Negative for arthralgias and myalgias.  Skin: Negative for color change and rash.  Neurological: Negative for tremors, syncope and headaches.  Psychiatric/Behavioral: Negative for confusion and dysphoric mood.    Physical Exam Updated Vital Signs BP 116/72   Pulse 71   Temp 97.8 F (36.6 C) (Oral)   Resp 17   Ht 5\' 8"  (1.727 m)   Wt 63.8 kg   SpO2 99%   BMI 21.39 kg/m   Physical Exam Vitals and nursing note reviewed.  Constitutional:      Appearance: He is well-developed.  HENT:     Head: Normocephalic and atraumatic.  Eyes:     Pupils: Pupils are equal, round, and reactive to light.  Neck:     Vascular: No JVD.  Cardiovascular:     Rate and Rhythm: Normal rate and  regular rhythm.     Heart sounds: No murmur heard. No friction rub. No gallop.   Pulmonary:     Effort: No respiratory distress.     Breath sounds: No wheezing.  Chest:      Chest wall: No tenderness.  Abdominal:     General: There is no distension.     Tenderness: There is no guarding or rebound.  Musculoskeletal:        General: Normal range of motion.     Cervical back: Normal range of motion and neck supple.  Skin:    Coloration: Skin is not pale.     Findings: No rash.  Neurological:     Mental Status: He is alert and oriented to person, place, and time.  Psychiatric:        Behavior: Behavior normal.     ED Results / Procedures / Treatments   Labs (all labs ordered are listed, but only abnormal results are displayed) Labs Reviewed  BASIC METABOLIC PANEL - Abnormal; Notable for the following components:      Result Value   Sodium 134 (*)    CO2 19 (*)    Glucose, Bld 137 (*)    Creatinine, Ser 1.36 (*)    GFR, Estimated 57 (*)    All other components within normal limits  CBC - Abnormal; Notable for the following components:   WBC 15.3 (*)    RDW 17.1 (*)    All other components within normal limits  TROPONIN I (HIGH SENSITIVITY) - Abnormal; Notable for the following components:   Troponin I (High Sensitivity) 10,422 (*)    All other components within normal limits  TROPONIN I (HIGH SENSITIVITY) - Abnormal; Notable for the following components:   Troponin I (High Sensitivity) 11,585 (*)    All other components within normal limits  RESP PANEL BY RT-PCR (FLU A&B, COVID) ARPGX2  HEPARIN LEVEL (UNFRACTIONATED)  HIV ANTIBODY (ROUTINE TESTING W REFLEX)  BASIC METABOLIC PANEL  LIPID PANEL  CBC  PROTIME-INR    EKG None  Radiology DG Chest 2 View  Result Date: 09/01/2020 CLINICAL DATA:  Chest pain EXAM: CHEST - 2 VIEW COMPARISON:  12/02/2019 FINDINGS: The heart size and mediastinal contours are within normal limits. Both lungs are clear. The visualized skeletal structures are unremarkable. IMPRESSION: No active cardiopulmonary disease. Electronically Signed   By: Ulyses Jarred M.D.   On: 09/01/2020 01:39    Procedures Procedures    Medications Ordered in ED Medications  nitroGLYCERIN (NITROSTAT) SL tablet 0.4 mg (has no administration in time range)  nitroGLYCERIN 50 mg in dextrose 5 % 250 mL (0.2 mg/mL) infusion (15 mcg/min Intravenous Rate/Dose Change 09/01/20 0426)  heparin ADULT infusion 100 units/mL (25000 units/262mL) (800 Units/hr Intravenous New Bag/Given 09/01/20 0241)  aspirin EC tablet 81 mg (has no administration in time range)  metoprolol tartrate (LOPRESSOR) tablet 12.5 mg (has no administration in time range)  ranolazine (RANEXA) 12 hr tablet 1,000 mg (has no administration in time range)  rosuvastatin (CRESTOR) tablet 20 mg (has no administration in time range)  nicotine (NICODERM CQ - dosed in mg/24 hours) patch 21 mg (has no administration in time range)  levothyroxine (SYNTHROID) tablet 50 mcg (has no administration in time range)  pantoprazole (PROTONIX) EC tablet 40 mg (has no administration in time range)  clopidogrel (PLAVIX) tablet 75 mg (has no administration in time range)  gabapentin (NEURONTIN) capsule 300 mg (has no administration in time range)  acetaminophen (TYLENOL) tablet 650 mg (has no  administration in time range)  ondansetron (ZOFRAN) injection 4 mg (has no administration in time range)  aspirin chewable tablet 324 mg (324 mg Oral Given 09/01/20 0221)  morphine 2 MG/ML injection 2 mg (2 mg Intravenous Given 09/01/20 0222)  heparin bolus via infusion 4,000 Units (4,000 Units Intravenous Bolus from Bag 09/01/20 0241)    ED Course  I have reviewed the triage vital signs and the nursing notes.  Pertinent labs & imaging results that were available during my care of the patient were reviewed by me and considered in my medical decision making (see chart for details).    MDM Rules/Calculators/A&P                          70 yo M with a chief complaints of chest pain.  This is typical in nature, exactly the same as his prior heart attacks.  EKG without obvious change.  Patient's troponin  is 10,000.  Still having pain will start on nitro infusion.  Start on heparin.  Cards admit.   CRITICAL CARE Performed by: Cecilio Asper   Total critical care time: 35 minutes  Critical care time was exclusive of separately billable procedures and treating other patients.  Critical care was necessary to treat or prevent imminent or life-threatening deterioration.  Critical care was time spent personally by me on the following activities: development of treatment plan with patient and/or surrogate as well as nursing, discussions with consultants, evaluation of patient's response to treatment, examination of patient, obtaining history from patient or surrogate, ordering and performing treatments and interventions, ordering and review of laboratory studies, ordering and review of radiographic studies, pulse oximetry and re-evaluation of patient's condition.  The patients results and plan were reviewed and discussed.   Any x-rays performed were independently reviewed by myself.   Differential diagnosis were considered with the presenting HPI.  Medications  nitroGLYCERIN (NITROSTAT) SL tablet 0.4 mg (has no administration in time range)  nitroGLYCERIN 50 mg in dextrose 5 % 250 mL (0.2 mg/mL) infusion (15 mcg/min Intravenous Rate/Dose Change 09/01/20 0426)  heparin ADULT infusion 100 units/mL (25000 units/26mL) (800 Units/hr Intravenous New Bag/Given 09/01/20 0241)  aspirin EC tablet 81 mg (has no administration in time range)  metoprolol tartrate (LOPRESSOR) tablet 12.5 mg (has no administration in time range)  ranolazine (RANEXA) 12 hr tablet 1,000 mg (has no administration in time range)  rosuvastatin (CRESTOR) tablet 20 mg (has no administration in time range)  nicotine (NICODERM CQ - dosed in mg/24 hours) patch 21 mg (has no administration in time range)  levothyroxine (SYNTHROID) tablet 50 mcg (has no administration in time range)  pantoprazole (PROTONIX) EC tablet 40 mg (has no  administration in time range)  clopidogrel (PLAVIX) tablet 75 mg (has no administration in time range)  gabapentin (NEURONTIN) capsule 300 mg (has no administration in time range)  acetaminophen (TYLENOL) tablet 650 mg (has no administration in time range)  ondansetron (ZOFRAN) injection 4 mg (has no administration in time range)  aspirin chewable tablet 324 mg (324 mg Oral Given 09/01/20 0221)  morphine 2 MG/ML injection 2 mg (2 mg Intravenous Given 09/01/20 0222)  heparin bolus via infusion 4,000 Units (4,000 Units Intravenous Bolus from Bag 09/01/20 0241)    Vitals:   09/01/20 0245 09/01/20 0300 09/01/20 0345 09/01/20 0430  BP: 111/63 123/68 134/77 116/72  Pulse: 64 64 78 71  Resp: 15 20 19 17   Temp:      TempSrc:  SpO2: 100% 100% 99% 99%  Weight:      Height:        Final diagnoses:  NSTEMI (non-ST elevated myocardial infarction) [facility])    Admission/ observation were discussed with the admitting physician, patient and/or family and they are comfortable with the plan.    Final Clinical Impression(s) / ED Diagnoses Final diagnoses:  NSTEMI (non-ST elevated myocardial infarction) [facility])    Rx / [place] Orders ED Discharge Orders    None       Deno Etienne, DO 09/01/20 [phone]

## 2020-09-01 NOTE — ED Notes (Signed)
Admitting at bedside 

## 2020-09-01 NOTE — Progress Notes (Signed)
[place] for Heparin Indication: chest pain/ACS  Allergies  Allergen Reactions  . Coreg [Carvedilol] Nausea And Vomiting and Other (See Comments)    Per patient made him dizzy and light sensitive  . Sulfa Antibiotics Nausea And Vomiting and Other (See Comments)    Also headaches     Patient Measurements: Height: 5\' 8"  (172.7 cm) Weight: 63.8 kg (140 lb 10.5 oz) IBW/kg (Calculated) : 68.4 Vital Signs: Temp: 97.6 F (36.4 C) (03/12 1330) Temp Source: Oral (03/12 1330) BP: 120/67 (03/12 1558) Pulse Rate: 0 (03/12 1603)  Labs: Recent Labs    09/01/20 0117 09/01/20 0312 09/01/20 0437 09/01/20 1058  HGB 13.6  --  12.9*  --   HCT 43.2  --  39.4  --   PLT 263  --  263  --   LABPROT  --   --  13.6  --   INR  --   --  1.1  --   HEPARINUNFRC  --   --   --  0.17*  CREATININE 1.36*  --  1.29*  --   TROPONINIHS 10,422* 11,585*  --   --     Estimated Creatinine Clearance: 49.5 mL/min (A) (by C-G formula based on SCr of 1.29 mg/dL (H)).   Medical History: Past Medical History:  Diagnosis Date  . Anxiety    off xanax  and paxil since 3/13  . Arthritis   . Bilateral carotid artery disease ([place])    s/p R ICA stent 03/28/2015 with distal protection. Known chronically occluded L ICA. Carotid stent complicated by hypotension and acute stroke [place]  . Cataract   . COPD (chronic obstructive pulmonary disease) ([place])   . Coronary artery disease    a. s/p CABG in 2011 with LIMA-LAD, SVG-RI/OM, and SVG-PDA b. occluded SVG-PDA by cath in 2015 c. 10/2016: NSTEMI with cath showing thrombus along the distal graft to insertion of SVG-OM2 with DES placed.   . CVA (cerebral infarction)    occured on 10/10 several days after R ICA carotid stenting  . Depression   . Diabetes mellitus   . GERD (gastroesophageal reflux disease)   . GERD with stricture   . High cholesterol   . Hx of adenomatous colonic polyps 05/2020   10 diminutive  adenomas  . Hypertension    type 2 NIDDM  . Myocardial infarct (HCC)    x4 last 10 yrs  . Pneumonia    hx  . 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 Skin cancer, basal cell    tumor basal cell rem from lft arm  . Smoker   . Substance abuse [facility])     Assessment: 70 y/o M with significant cardiac history presents to the ED with chest pain, found to have significantly elevated troponin, starting heparin, CBC ok, Scr 1.36, PTA meds reviewed.   Pt now s/p cath lab with [place] placed, no intervention done. MD asked pharmacy to resume heparin 8h after sheath removed (radial) and continue x24h for now.  Goal of Therapy:  Heparin level 0.3-0.7 units/ml Monitor platelets by anticoagulation protocol: Yes   Plan:  -Restart heparin with no bolus at 1000 units/h at 2330 -Check heparin level in 8h -F/U heparin duration 3/13   Arrie Senate, PharmD, BCPS, [facility] Clinical Pharmacist [phone] Please check AMION for all [facility] numbers 09/01/2020

## 2020-09-01 NOTE — Progress Notes (Signed)
ANTICOAGULATION CONSULT NOTE - Initial Consult  Pharmacy Consult for Heparin Indication: chest pain/ACS  Allergies  Allergen Reactions  . Coreg [Carvedilol] Nausea And Vomiting and Other (See Comments)    Per patient made him dizzy and light sensitive  . Sulfa Antibiotics Nausea And Vomiting and Other (See Comments)    Also headaches     Patient Measurements: Height: 5\' 8"  (172.7 cm) Weight: 63.8 kg (140 lb 10.5 oz) IBW/kg (Calculated) : 68.4 Vital Signs: Temp: 97.8 F (36.6 C) (03/12 0113) Temp Source: Oral (03/12 0113) BP: 111/63 (03/12 0245) Pulse Rate: 64 (03/12 0245)  Labs: Recent Labs    09/01/20 0117  HGB 13.6  HCT 43.2  PLT 263  CREATININE 1.36*  TROPONINIHS 10,422*    Estimated Creatinine Clearance: 46.9 mL/min (A) (by C-G formula based on SCr of 1.36 mg/dL (H)).   Medical History: Past Medical History:  Diagnosis Date  . Anxiety    off xanax  and paxil since 3/13  . Arthritis   . Bilateral carotid artery disease ([place])    s/p R ICA stent 03/28/2015 with distal protection. Known chronically occluded L ICA. Carotid stent complicated by hypotension and acute stroke in [place] territory  . Cataract   . COPD (chronic obstructive pulmonary disease) ([place])   . Coronary artery disease    a. s/p CABG in 2011 with LIMA-LAD, SVG-RI/OM, and SVG-PDA b. occluded SVG-PDA by cath in 2015 c. 10/2016: NSTEMI with cath showing thrombus along the distal graft to insertion of SVG-OM2 with DES placed.   . CVA (cerebral infarction)    occured on 10/10 several days after R ICA carotid stenting  . Depression   . Diabetes mellitus   . GERD (gastroesophageal reflux disease)   . GERD with stricture   . High cholesterol   . Hx of adenomatous colonic polyps 05/2020   10 diminutive adenomas  . Hypertension    type 2 NIDDM  . Myocardial infarct (HCC)    x4 last 10 yrs  . Pneumonia    hx  . 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 Skin  cancer, basal cell    tumor basal cell rem from lft arm  . Smoker   . Substance abuse [facility])     Assessment: 70 y/o M with significant cardiac history presents to the ED with chest pain, found to have significantly elevated troponin, starting heparin, CBC ok, Scr 1.36, PTA meds reviewed.   Goal of Therapy:  Heparin level 0.3-0.7 units/ml Monitor platelets by anticoagulation protocol: Yes   Plan:  Heparin 4000 units BOLUS Start heparin drip at 800 units/hr 1100 Heparin level Daily CBC/Heparin level Monitor for bleeding  Narda Bonds, PharmD, BCPS Clinical Pharmacist Phone: [phone]

## 2020-09-01 NOTE — ED Notes (Signed)
Pt complaining of increased CP pain, rated 7/10.  Provider paged

## 2020-09-02 ENCOUNTER — Inpatient Hospital Stay (HOSPITAL_COMMUNITY): Payer: Medicare Other

## 2020-09-02 DIAGNOSIS — I214 Non-ST elevation (NSTEMI) myocardial infarction: Secondary | ICD-10-CM | POA: Diagnosis not present

## 2020-09-02 DIAGNOSIS — R570 Cardiogenic shock: Secondary | ICD-10-CM

## 2020-09-02 LAB — HEPARIN LEVEL (UNFRACTIONATED)
Heparin Unfractionated: 0.2 IU/mL — ABNORMAL LOW (ref 0.30–0.70)
Heparin Unfractionated: 0.15 IU/mL — ABNORMAL LOW (ref 0.30–0.70)
Heparin Unfractionated: 0.45 IU/mL (ref 0.30–0.70)

## 2020-09-02 LAB — BASIC METABOLIC PANEL
Anion gap: 9 (ref 5–15)
BUN: 16 mg/dL (ref 8–23)
CO2: 18 mmol/L — ABNORMAL LOW (ref 22–32)
Calcium: 7.9 mg/dL — ABNORMAL LOW (ref 8.9–10.3)
Chloride: 102 mmol/L (ref 98–111)
Creatinine, Ser: 1.43 mg/dL — ABNORMAL HIGH (ref 0.61–1.24)
GFR, Estimated: 53 mL/min — ABNORMAL LOW (ref >60)
Glucose, Bld: 114 mg/dL — ABNORMAL HIGH (ref 70–99)
Potassium: 3.4 mmol/L — ABNORMAL LOW (ref 3.5–5.1)
Sodium: 129 mmol/L — ABNORMAL LOW (ref 135–145)

## 2020-09-02 LAB — CBC
HCT: 34.7 % — ABNORMAL LOW (ref 39.0–52.0)
Hemoglobin: 12 g/dL — ABNORMAL LOW (ref 13.0–17.0)
MCH: 32.7 pg (ref 26.0–34.0)
MCHC: 34.6 g/dL (ref 30.0–36.0)
MCV: 94.6 fL (ref 80.0–100.0)
Platelets: 197 10*3/uL (ref 150–400)
RBC: 3.67 MIL/uL — ABNORMAL LOW (ref 4.22–5.81)
RDW: 17.2 % — ABNORMAL HIGH (ref 11.5–15.5)
WBC: 13 10*3/uL — ABNORMAL HIGH (ref 4.0–10.5)
nRBC: 0 % (ref 0.0–0.2)

## 2020-09-02 LAB — GLUCOSE, CAPILLARY
Glucose-Capillary: 128 mg/dL — ABNORMAL HIGH (ref 70–99)
Glucose-Capillary: 81 mg/dL (ref 70–99)
Glucose-Capillary: 95 mg/dL (ref 70–99)
Glucose-Capillary: 111 mg/dL — ABNORMAL HIGH (ref 70–99)

## 2020-09-02 LAB — COOXEMETRY PANEL
Carboxyhemoglobin: 1.2 % (ref 0.5–1.5)
Methemoglobin: 1.1 % (ref 0.0–1.5)
O2 Saturation: 62.3 %
Total hemoglobin: 12.7 g/dL (ref 12.0–16.0)

## 2020-09-02 LAB — ECHOCARDIOGRAM COMPLETE
Area-P 1/2: 4.49 cm2
Height: 68 in
S' Lateral: 4.1 cm
Weight: 2328.06 oz

## 2020-09-02 LAB — HEMOGLOBIN A1C
Hgb A1c MFr Bld: 5.9 % — ABNORMAL HIGH (ref 4.8–5.6)
Mean Plasma Glucose: 122.63 mg/dL

## 2020-09-02 MED ORDER — POTASSIUM CHLORIDE CRYS ER 20 MEQ PO TBCR
40.0000 meq | EXTENDED_RELEASE_TABLET | Freq: Once | ORAL | Status: AC
  Administered 2020-09-02: 40 meq via ORAL
  Filled 2020-09-02: qty 2

## 2020-09-02 NOTE — Progress Notes (Signed)
[name] for Heparin Indication: chest pain/ACS  Allergies  Allergen Reactions  . Coreg [Carvedilol] Nausea And Vomiting and Other (See Comments)    Per patient made him dizzy and light sensitive  . Sulfa Antibiotics Nausea And Vomiting and Other (See Comments)    Also headaches     Patient Measurements: Height: 5\' 8"  (172.7 cm) Weight: 66 kg (145 lb 8.1 oz) IBW/kg (Calculated) : 68.4 Vital Signs: Temp: 99.32 F (37.4 C) (03/13 2100) Temp Source: Core (03/13 2000) BP: 118/67 (03/13 2100) Pulse Rate: 87 (03/13 2100)  Labs: Recent Labs    09/01/20 0117 09/01/20 0312 09/01/20 0437 09/01/20 1058 09/02/20 0936 09/02/20 1228 09/02/20 2020  HGB 13.6  --  12.9*  --  12.0*  --   --   HCT 43.2  --  39.4  --  34.7*  --   --   PLT 263  --  263  --  197  --   --   LABPROT  --   --  13.6  --   --   --   --   INR  --   --  1.1  --   --   --   --   HEPARINUNFRC  --   --   --    < > 0.20* 0.15* 0.45  CREATININE 1.36*  --  1.29*  --  1.43*  --   --   TROPONINIHS 10,422* 11,585*  --   --   --   --   --    < > = values in this interval not displayed.    Estimated Creatinine Clearance: 46.2 mL/min (A) (by C-G formula based on SCr of 1.43 mg/dL (H)).   Medical History: Past Medical History:  Diagnosis Date  . Anxiety    off xanax  and paxil since 3/13  . Arthritis   . Bilateral carotid artery disease ([place])    s/p R ICA stent 03/28/2015 with distal protection. Known chronically occluded L ICA. Carotid stent complicated by hypotension and acute stroke in [place] territory  . Cataract   . COPD (chronic obstructive pulmonary disease) ([place])   . Coronary artery disease    a. s/p CABG in 2011 with LIMA-LAD, SVG-RI/OM, and SVG-PDA b. occluded SVG-PDA by cath in 2015 c. 10/2016: NSTEMI with cath showing thrombus along the distal graft to insertion of SVG-OM2 with DES placed.   . CVA (cerebral infarction)    occured on 10/10 several days after R  ICA carotid stenting  . Depression   . Diabetes mellitus   . GERD (gastroesophageal reflux disease)   . GERD with stricture   . High cholesterol   . Hx of adenomatous colonic polyps 05/2020   10 diminutive adenomas  . Hypertension    type 2 NIDDM  . Myocardial infarct (HCC)    x4 last 10 yrs  . Pneumonia    hx  . 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 Skin cancer, basal cell    tumor basal cell rem from lft arm  . Smoker   . Substance abuse [facility])     Assessment: 70 y/o Jason [name] with significant cardiac history presents to the ED with chest pain, found to have significantly elevated troponin, starting heparin, CBC ok, Scr 1.36, PTA meds reviewed.   Pt now s/p cath lab with Swan placed, no intervention done. MD asked pharmacy to resume heparin 8h after sheath removed (radial).  Heparin therapeutic on recheck this evening.  Goal of Therapy:  Heparin level 0.3-0.7 units/ml Monitor platelets by anticoagulation protocol: Yes   Plan:  -Continue heparin 1250 units/h -Daily heparin level and CBC   Arrie Senate, PharmD, BCPS, [facility] Clinical Pharmacist [phone] Please check AMION for all [facility] Pharmacy numbers 09/02/2020

## 2020-09-02 NOTE — Progress Notes (Signed)
[place] for Heparin Indication: chest pain/ACS  Allergies  Allergen Reactions  . Coreg [Carvedilol] Nausea And Vomiting and Other (See Comments)    Per patient made him dizzy and light sensitive  . Sulfa Antibiotics Nausea And Vomiting and Other (See Comments)    Also headaches     Patient Measurements: Height: 5\' 8"  (172.7 cm) Weight: 66 kg (145 lb 8.1 oz) IBW/kg (Calculated) : 68.4 Vital Signs: Temp: 98.42 F (36.9 C) (03/13 1300) Temp Source: Core (03/13 0800) BP: 97/60 (03/13 1300) Pulse Rate: 81 (03/13 1300)  Labs: Recent Labs    09/01/20 0117 09/01/20 0312 09/01/20 0437 09/01/20 1058 09/02/20 0936 09/02/20 1228  HGB 13.6  --  12.9*  --  12.0*  --   HCT 43.2  --  39.4  --  34.7*  --   PLT 263  --  263  --  197  --   LABPROT  --   --  13.6  --   --   --   INR  --   --  1.1  --   --   --   HEPARINUNFRC  --   --   --  0.17* 0.20* 0.15*  CREATININE 1.36*  --  1.29*  --  1.43*  --   TROPONINIHS 10,422* 11,585*  --   --   --   --     Estimated Creatinine Clearance: 46.2 mL/min (A) (by C-G formula based on SCr of 1.43 mg/dL (H)).   Medical History: Past Medical History:  Diagnosis Date  . Anxiety    off xanax  and paxil since 3/13  . Arthritis   . Bilateral carotid artery disease ([place])    s/p R ICA stent 03/28/2015 with distal protection. Known chronically occluded L ICA. Carotid stent complicated by hypotension and acute stroke in [place] territory  . Cataract   . COPD (chronic obstructive pulmonary disease) ([place])   . Coronary artery disease    a. s/p CABG in 2011 with LIMA-LAD, SVG-RI/OM, and SVG-PDA b. occluded SVG-PDA by cath in 2015 c. 10/2016: NSTEMI with cath showing thrombus along the distal graft to insertion of SVG-OM2 with DES placed.   . CVA (cerebral infarction)    occured on 10/10 several days after R ICA carotid stenting  . Depression   . Diabetes mellitus   . GERD (gastroesophageal reflux disease)    . GERD with stricture   . High cholesterol   . Hx of adenomatous colonic polyps 05/2020   10 diminutive adenomas  . Hypertension    type 2 NIDDM  . Myocardial infarct (HCC)    x4 last 10 yrs  . Pneumonia    hx  . 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 Skin cancer, basal cell    tumor basal cell rem from lft arm  . Smoker   . Substance abuse [facility])     Assessment: 70 y/o M with significant cardiac history presents to the ED with chest pain, found to have significantly elevated troponin, starting heparin, CBC ok, Scr 1.36, PTA meds reviewed.   Pt now s/p cath lab with Swan placed, no intervention done. MD asked pharmacy to resume heparin 8h after sheath removed (radial).   HL remains subtherapeutic at 0.15, on drip rate 1000 units/hr. Heparin was running through swan this morning, so infusion was switched to run through PIV and recheck level drawn from swan. CBC stable. No overt bleeding or infusion  issues per nursing.   Goal of Therapy:  Heparin level 0.3-0.7 units/ml Monitor platelets by anticoagulation protocol: Yes   Plan:  -Increase heparin to 1250 units/hr -Check HL in 6 hrs -Continue infusion for 24 more hours through 3/14 at 1200, stop time entered -Daily HL, CBC, s/sx bleeding  Richardine Service, PharmD, BCPS PGY2 Cardiology Pharmacy Resident Phone: [phone] 09/02/2020  2:03 PM  Please check [email or web address] for unit-specific pharmacy phone numbers.

## 2020-09-02 NOTE — Progress Notes (Signed)
  Echocardiogram 2D Echocardiogram has been performed.  Matilde Bash 09/02/2020, 10:37 AM

## 2020-09-02 NOTE — Progress Notes (Signed)
[place] Progress Note Patient Name: Jason [name] DOB: 04/05/1951 MRN: [id number]   Date of Service  09/02/2020  HPI/Events of Note  Discussed patient's BP with RN. He is on milrinone at present. Depressed EF and CO. S/p cath lab yesterday. Swan in place with CVP reading of 3 at present. HR 76 (SR), BP 84/48 (MAP 59). He is sleeping comfortably, but rouses easily. When he is awakened, his BP increases to an SBP in 90s. He is making urine (albeit helped by Lasix given earlier) and is warm and well perfused in his extremities.   eICU Interventions  I do not think his current BP reflects a significant change to his baseline from earlier today. Rather, he has a low BP in setting of systolic heart failure and this has become exaggerated during the physiologic dip in BP that occurs during sleep.  Continue to monitor closely, but without changes to plan at this time.     Intervention Category Intermediate Interventions: Hypotension - evaluation and management  Charlott Rakes 09/02/2020, 12:49 AM

## 2020-09-02 NOTE — Consult Note (Signed)
Advanced Heart Failure Team Consult Note   Primary Physician: Horald Pollen, MD PCP-Cardiologist:  Shelva Majestic, MD  Reason for Consultation: Cardiogenic shock  HPI:    Jason [name] is seen today for evaluation of cardiogenic shock at the request of Dr. Ellyn Hack.   Mr. Kauffmann is a 70 y/o male with PAD, HTN, DM2, CAD s/p CABG in 2011 and PCI to SVG in 2018. Admitted 3/12 with NSTEMI. hstrop 11k  Taken to cath lab on 3/12.  - Severe 3v native CAD - LIMA ok - SVG-> PDA 100% (chronic) - SVG -> OM-1 -> OM-2 100% flush occlusion (culpit)  - not PCI target - EF 25-35% LVEDP 35 - PA 47/19 (29) mmHg, PCWP mean 22 mmHg PA sat 57%, ao sat 95% Fick 3.5/1.9 TD 3.0/1.7;   Unable to open SVG. Started milrinone and IV lasix and moved to ICU.   Diuresed 2L. Feeling better. No CP since this am. Denies COP, orthopnea or PND.   [place] CVP 11-12 PA 50/26 Thermo 4./2.2  Echo this am 3/13: EF 35-40% (prelim)   Review of Systems: [y] = yes, [ ]  = no   . General: Weight gain [ ] ; Weight loss [ ] ; Anorexia [ ] ; Fatigue [ ] ; Fever [ ] ; Chills [ ] ; Weakness [ ]   . Cardiac: Chest pain/pressure Blue.Reese ]; Resting SOB [ ] ; Exertional SOB [ y]; Orthopnea [ ] ; Pedal Edema Blue.Reese ]; Palpitations [ ] ; Syncope [ ] ; Presyncope [ ] ; Paroxysmal nocturnal dyspnea[ ]   . Pulmonary: Cough [ ] ; Wheezing[ ] ; Hemoptysis[ ] ; Sputum [ ] ; Snoring [ ]   . GI: Vomiting[ ] ; Dysphagia[ ] ; Melena[ ] ; Hematochezia [ ] ; Heartburn[ ] ; Abdominal pain [ ] ; Constipation [ ] ; Diarrhea [ ] ; BRBPR [ ]   . GU: Hematuria[ ] ; Dysuria [ ] ; Nocturia[ ]   . Vascular: Pain in legs with walking [ ] ; Pain in feet with lying flat [ ] ; Non-healing sores [ ] ; Stroke [ ] ; TIA [ ] ; Slurred speech [ ] ;  . Neuro: Headaches[ ] ; Vertigo[ ] ; Seizures[ ] ; Paresthesias[ ] ;Blurred vision [ ] ; Diplopia [ ] ; Vision changes [ ]   . Ortho/Skin: Arthritis Blue.Reese ]; Joint pain Blue.Reese ]; Muscle pain [ ] ; Joint swelling [ ] ; Back Pain Blue.Reese ]; Rash [ ]   . Psych: Depression[  ]; Anxiety[ ]   . Heme: Bleeding problems [ ] ; Clotting disorders [ ] ; Anemia [ ]   . Endocrine: Diabetes [ y]; Thyroid dysfunction[y ]  Past Medical History: Past Medical History:  Diagnosis Date  . Anxiety    off xanax  and paxil since 3/13  . Arthritis   . Bilateral carotid artery disease ([place])    s/p R ICA stent 03/28/2015 with distal protection. Known chronically occluded L ICA. Carotid stent complicated by hypotension and acute stroke in [place]  . Cataract   . COPD (chronic obstructive pulmonary disease) ([place])   . Coronary artery disease    a. s/p CABG in 2011 with LIMA-LAD, SVG-RI/OM, and SVG-PDA b. occluded SVG-PDA by cath in 2015 c. 10/2016: NSTEMI with cath showing thrombus along the distal graft to insertion of SVG-OM2 with DES placed.   . CVA (cerebral infarction)    occured on 10/10 several days after R ICA carotid stenting  . Depression   . Diabetes mellitus   . GERD (gastroesophageal reflux disease)   . GERD with stricture   . High cholesterol   . Hx of adenomatous colonic polyps 05/2020   10 diminutive adenomas  .  Hypertension    type 2 NIDDM  . Myocardial infarct (HCC)    x4 last 10 yrs  . Pneumonia    hx  . 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 Skin cancer, basal cell    tumor basal cell rem from lft arm  . Smoker   . Substance abuse [facility])     Past Surgical History: Past Surgical History:  Procedure Laterality Date  . ABDOMINAL AORTOGRAM W/LOWER EXTREMITY Bilateral 05/31/2019   Procedure: ABDOMINAL AORTOGRAM W/LOWER EXTREMITY;  Surgeon: Waynetta Sandy, MD;  Location: [place] CV [facility];  Service: Cardiovascular;  Laterality: Bilateral;  . AMPUTATION TOE Right 06/02/2019   Procedure: Amputation Right Great Toe and Second Toe;  Surgeon: Waynetta Sandy, MD;  Location: [place];  Service: Vascular;  Laterality: Right;  . BACK SURGERY  Jan 2014, Dec 2011   Dr Vertell Limber  . BALLOON DILATION N/A  06/13/2020   Procedure: BALLOON DILATION;  Surgeon: Gatha Mayer, MD;  Location: [facility] ENDOSCOPY;  Service: Endoscopy;  Laterality: N/A;  . BIOPSY  06/13/2020   Procedure: BIOPSY;  Surgeon: Gatha Mayer, MD;  Location[facility]COPY;  Service: Endoscopy;;  . BYPASS GRAFT FEMORAL-PERONEAL Left 11/29/2019   Procedure: Left Bypass Graft Femoral-Peroneal using non reversed Greater Sapphenous vein;  Surgeon: Waynetta Sandy, MD;  Location: [place];  Service: Vascular;  Laterality: Left;  . CARDIAC CATHETERIZATION  4/12   Medical Rx  . COLONOSCOPY WITH PROPOFOL N/A 06/13/2020   Procedure: COLONOSCOPY WITH PROPOFOL;  Surgeon: Gatha Mayer, MD;  Location: [facility] ENDOSCOPY;  Service: Endoscopy;  Laterality: N/A;  . CORONARY ANGIOGRAM  01/17/14   med rx  . CORONARY ANGIOGRAM  4/13   med Rx  . CORONARY ANGIOGRAM  1/15   Med Rx  . CORONARY ANGIOPLASTY  Jan 2004   RCA  . CORONARY ARTERY BYPASS GRAFT  07/24/2009   L-LAD, SVG-RI/OM, SVG-PDA  . CORONARY STENT INTERVENTION N/A 10/23/2016   Procedure: Coronary Stent Intervention;  Surgeon: Peter M Martinique, MD;  Location: [place] CV [facility];  Service: Cardiovascular;  Laterality: N/A;  . ENDARTERECTOMY FEMORAL Right 06/02/2019   Procedure: Endarterectomy External Iliac  Femoral Artery and Profunda;  Surgeon: Waynetta Sandy, MD;  Location: [place];  Service: Vascular;  Laterality: Right;  . ESOPHAGOGASTRODUODENOSCOPY (EGD) WITH PROPOFOL N/A 06/13/2020   Procedure: ESOPHAGOGASTRODUODENOSCOPY (EGD) WITH PROPOFOL;  Surgeon: Gatha Mayer, MD;  Location: [place];  Service: Endoscopy;  Laterality: N/A;  . EYE SURGERY     cat bil  . FEMORAL ARTERY - FEMORAL ARTERY BYPASS GRAFT Right 2004   femoral enarterectomy  . FEMORAL-POPLITEAL BYPASS GRAFT Right 06/02/2019   Procedure: RIGHT LEG BYPASS GRAFT FEMORAL-POPLITEAL ARTERY using Gore Propaten Vascular Graft Removable Ring;  Surgeon: Waynetta Sandy, MD;  Location: [place];  Service:  Vascular;  Laterality: Right;  . LEFT HEART CATH AND CORS/GRAFTS ANGIOGRAPHY N/A 10/23/2016   Procedure: Left Heart Cath and Cors/Grafts Angiography;  Surgeon: Peter M Martinique, MD;  Location: [place] CV [facility];  Service: Cardiovascular;  Laterality: N/A;  . LEFT HEART CATHETERIZATION WITH CORONARY ANGIOGRAM N/A 10/03/2011   Procedure: LEFT HEART CATHETERIZATION WITH CORONARY ANGIOGRAM;  Surgeon: Pixie Casino, MD;  Location: [facility] CATH LAB;  Service: Cardiovascular;  Laterality: N/A;  . LEFT HEART CATHETERIZATION WITH CORONARY ANGIOGRAM N/A 07/08/2013   Procedure: LEFT HEART CATHETERIZATION WITH CORONARY ANGIOGRAM;  Surgeon: Blane Ohara, MD;  Location: [facility] CATH LAB;  Service: Cardiovascular;  Laterality: N/A;  . LEFT HEART CATHETERIZATION WITH CORONARY/GRAFT ANGIOGRAM N/A 01/17/2014   Procedure: LEFT HEART CATHETERIZATION WITH Beatrix Fetters;  Surgeon: Troy Sine, MD;  Location: [facility] CATH LAB;  Service: Cardiovascular;  Laterality: N/A;  . LOWER EXTREMITY ANGIOGRAPHY N/A 11/28/2019   Procedure: LOWER EXTREMITY ANGIOGRAPHY;  Surgeon: Waynetta Sandy, MD;  Location: [place] CV LAB;  Service: Cardiovascular;  Laterality: N/A;  . PATCH ANGIOPLASTY Right 06/02/2019   Procedure: Patch Angioplasty using Hemashield Platinum Finesse Patch of the External Iliac Femoral Artery and Profunda;  Surgeon: Waynetta Sandy, MD;  Location: [place];  Service: Vascular;  Laterality: Right;  . PERIPHERAL VASCULAR BALLOON ANGIOPLASTY Right 11/28/2019   Procedure: PERIPHERAL VASCULAR BALLOON ANGIOPLASTY;  Surgeon: Waynetta Sandy, MD;  Location: [place] CV LAB;  Service: Cardiovascular;  Laterality: Right;  Common femoral  . PERIPHERAL VASCULAR CATHETERIZATION N/A 03/28/2015   Procedure: Carotid PTA/Stent Intervention;  Surgeon: Lorretta Harp, MD;  Location: [place] CV LAB;  Service: Cardiovascular;  Laterality: N/A;  . PERIPHERAL VASCULAR INTERVENTION Left 11/28/2019    Procedure: PERIPHERAL VASCULAR INTERVENTION;  Surgeon: Waynetta Sandy, MD;  Location: [place] CV LAB;  Service: Cardiovascular;  Laterality: Left;  external iliac  . POLYPECTOMY  06/13/2020   Procedure: POLYPECTOMY;  Surgeon: Gatha Mayer, MD;  Location: [facility] ENDOSCOPY;  Service: Endoscopy;;  . US EXTREMITY*L*     lft arm tumor removed     Family History: Family History  Problem Relation Age of Onset  . Arthritis Mother   . Heart disease Father     Social History: Social History   Socioeconomic History  . Marital status: Single    Spouse name: Not on file  . Number of children: 1  . Years of education: 36  . Highest education level: Not on file  Occupational History  . Occupation: welder-fabricator  Tobacco Use  . Smoking status: Current Every Day Smoker    Packs/day: 1.00    Years: 48.00    Pack years: 48.00    Types: Cigarettes  . Smokeless tobacco: Never Used  Vaping Use  . Vaping Use: Never used  Substance and Sexual Activity  . Alcohol use: Yes    Alcohol/week: 0.0 standard drinks    Comment: socially  . Drug use: Not Currently  . Sexual activity: Not on file  Other Topics Concern  . Not on file  Social History Narrative   Lives alone   Caffeine use: Drinks pepsi/coffee (32 oz diet pepsi per day)    Exercise walking 4 times per week for 1 mile   Current PPD smoker.    Social Determinants of Health   Financial Resource Strain: Not on file  Food Insecurity: Not on file  Transportation Needs: Not on file  Physical Activity: Not on file  Stress: Not on file  Social Connections: Not on file    Allergies:  Allergies  Allergen Reactions  . Coreg [Carvedilol] Nausea And Vomiting and Other (See Comments)    Per patient made him dizzy and light sensitive  . Sulfa Antibiotics Nausea And Vomiting and Other (See Comments)    Also headaches     Objective:    Vital Signs:   Temp:  [97.6 F (36.4 C)-98.96 F (37.2 C)] 98.42 F (36.9 C)  (03/13 1000) Pulse Rate:  [0-113] 77 (03/13 1000) Resp:  [9-46] 22 (03/13 1000) BP: (74-125)/(44-96) 108/56 (03/13 1000) SpO2:  [0 %-100 %] 100 % (03/13 1000) Weight:  [66 kg] 66  kg (03/13 0500) Last BM Date:  (PTA)  Weight change: Filed Weights   09/01/20 0109 09/02/20 0500  Weight: 63.8 kg 66 kg    Intake/Output:   Intake/Output Summary (Last 24 hours) at 09/02/2020 1210 Last data filed at 09/02/2020 0700 Gross per 24 hour  Intake 399.09 ml  Output 1900 ml  Net -1500.91 ml      Physical Exam    General:  Sitting up in bed . No resp difficulty HEENT: normal Neck: supple. JVP to jaw . Carotids 2+ bilat; no bruits. No lymphadenopathy or thyromegaly appreciated. Cor: PMI nondisplaced. Regular rate & rhythm. No rubs, gallops or murmurs. Lungs: clear Abdomen: soft, nontender, nondistended. No hepatosplenomegaly. No bruits or masses. Good bowel sounds. Extremities: no cyanosis, clubbing, rash, 1+ edema RFV swan Neuro: alert & orientedx3, cranial nerves grossly intact. moves all 4 extremities w/o difficulty. Affect pleasant   Telemetry   NSR 70s Personally reviewed  EKG    NSR 81 RBBB and LAFB Personally reviewed  Labs   Basic Metabolic Panel: Recent Labs  Lab 09/01/20 0117 09/01/20 0437 09/02/20 0936  NA 134* 135 129*  K 3.9 4.2 3.4*  CL 106 107 102  CO2 19* 19* 18*  GLUCOSE 137* 121* 114*  BUN 12 11 16   CREATININE 1.36* 1.29* 1.43*  CALCIUM 9.1 9.2 7.9*    Liver Function Tests: No results for input(s): AST, ALT, ALKPHOS, BILITOT, PROT, ALBUMIN in the last 168 hours. No results for input(s): LIPASE, AMYLASE in the last 168 hours. No results for input(s): AMMONIA in the last 168 hours.  CBC: Recent Labs  Lab 09/01/20 0117 09/01/20 0437 09/02/20 0936  WBC 15.3* 15.5* 13.0*  HGB 13.6 12.9* 12.0*  HCT 43.2 39.4 34.7*  MCV 99.8 96.8 94.6  PLT 263 263 197    Cardiac Enzymes: No results for input(s): CKTOTAL, CKMB, CKMBINDEX, TROPONINI in the last  168 hours.  BNP: BNP (last 3 results) No results for input(s): BNP in the last 8760 hours.  ProBNP (last 3 results) No results for input(s): PROBNP in the last 8760 hours.   CBG: Recent Labs  Lab 09/01/20 2224 09/02/20 0736 09/02/20 1139  GLUCAP 189* 128* 81    Coagulation Studies: Recent Labs    09/01/20 0437  LABPROT 13.6  INR 1.1     Imaging   CARDIAC CATHETERIZATION  Result Date: 09/01/2020  Mid LAD to Dist LAD lesion is 75% stenosed. -Mid LAD subtotally occluded; 1st Diag lesion is 50% stenosed.  LIMA-mLAD and is normal in caliber. Competitive flow noted to distal LAD  Ost RCA to Dist RCA lesion is 100% stenosed.  Ost Cx to Prox Cx lesion is 99% stenosed. Prox Cx lesion is 100% stenosed with 100% stenosed side branch in 1st Mrg.  SVG-RPDA graft was visualized by angiography. Origin lesion is 100% stenosed. Known CTO  Seq SVG- OM1-OM2 graft was visualized by angiography. Origin to Prox Graft lesion before 1st Mrg is 100% stenosed.  Hemodynamic findings consistent with mild SecondaryPulmonary Hypertension with Severely Reduced LV Function: PA sat 57%, AO sat 95%; CARDIAC OUTPUT -INDEX: (Fick) 3.5-1.199; (Thermal) 2.99-1.7  There is severe left ventricular systolic dysfunction.  LV end diastolic pressure is severely elevated.  The left ventricular ejection fraction is 25-35% by visual estimate.  There is trivial (1+) mitral regurgitation.  There is no aortic valve stenosis.  SUMMARY  Severe multivessel CAD of native vessels as well as grafts:  Culprit lesion: 100% flush occlusion of sequential SVG-OM1-OM2 (with previously occluded  OM1)  Known occlusion of native LCx & known occlusion of native RCA and SVG-RCA with collaterals filling from left to right  -Diffuse upstream LAD disease (stable) with mid subtotal occlusion-competitive flow from LIMA-LAD graft. ->  Left-to-right collaterals fill RPDA and PL system small caliber collaterals)  Severely reduced EF of  roughly 25% with severe inferior and inferolateral hypokinesis/akinesis. ->  Elevated LVEDP of 35 mmHg Consistent with Acute Combined Systolic and Diastolic Heart Failure  Right Heart Pressures: PA pressure 47/19 mmHg-mean 1229 mmHg, PCWP mean 22 mmHg.  Mild Secondary Pulmonary Hypertension  PA sat 57%, ao sat 95%.  CARDIAC OUTPUT-INDEX: (Fick) 3.5, 1.99; (Thermodilution) 2.99,1.7 RECOMMENDATIONS as noted above Transfer to CCU for ongoing care with advanced heart failure service IV milrinone drip with IV Lasix Glenetta Hew, MD  Port CXR  Result Date: 09/01/2020 CLINICAL DATA:  Central line placement EXAM: PORTABLE CHEST 1 VIEW COMPARISON:  09/01/2020 FINDINGS: Single frontal view of the chest demonstrates central venous catheter from an inferior vena cava approach. The catheter crosses the midline, likely within the pulmonary outflow tract, tip overlying the left infrahilar vessels. Cardiac silhouette is stable. No airspace disease, effusion, or pneumothorax. No acute bony abnormalities. IMPRESSION: 1. Central venous catheter from an inferior approach, coursing along the expected pulmonary outflow tract with tip overlying the proximal left lower lobe pulmonary artery. 2. No acute airspace disease. Electronically Signed   By: Randa Ngo M.D.   On: 09/01/2020 22:14      Medications:     Current Medications: . aspirin EC  81 mg Oral Daily  . Chlorhexidine Gluconate Cloth  6 each Topical Daily  . Chlorhexidine Gluconate Cloth  6 each Topical Daily  . clopidogrel  75 mg Oral Q breakfast  . furosemide  80 mg Intravenous BID  . gabapentin  600 mg Oral BID  . levothyroxine  50 mcg Oral Q0600  . nicotine  21 mg Transdermal Daily  . pantoprazole  40 mg Oral Daily  . ranolazine  1,000 mg Oral BID  . rosuvastatin  20 mg Oral Daily  . sodium chloride flush  10-40 mL Intracatheter Q12H  . sodium chloride flush  3 mL Intravenous Q12H     Infusions: . sodium chloride 10 mL/hr at 09/01/20 2024   . sodium chloride    . sodium chloride 10 mL/hr at 09/01/20 2025  . sodium chloride 20 mL/hr at 09/02/20 0700  . sodium chloride 10 mL/hr at 09/01/20 2025  . heparin 1,000 Units/hr (09/02/20 1017)  . milrinone 0.125 mcg/kg/min (09/02/20 0700)      Assessment/Plan   1. CAD/NSTEMI - s/p CABG 2011 - hstrop 11.5K - cath 3/12 results as above. Culprit lesion flush occlusion of SVG-> OM1-> OM2 (not PCI target) - Medical management  - DAPT/statin. Increase crestor to 40  - Continue heparin for 24 mor hours then stop - CR consult  2. Acute systolic HF due to iCM/NSTEMI -> cardiogenic shock - EF 25-35% by V-gram  - Echo this am 35-40% Personally reviewed - Co-ox 62% on milrinone 0.125 - Volume status improving but still elevated. Outputs marginal - Leave swan in today. Continue milrinone and diuresis.  - start spiro - no b-blocker yet with low output - likely ARB in am - SGLT2i prior to d/c  3. DM2 - cover with SSI.  - start SGLT2i prior to d/c   4. AKI - ? ATN vs contrast nepropathy - Creatinine baseline 1.1 -1.2 - today 1.4 - Follow. Avoid nephrotoxic agents  5. Tobacco use - on nicotine patch. - encouraged to quit  6. Hypokalemia - supp  - add spiro 12.5  7. Hyponatremia - restrict free water  CRITICAL CARE Performed by: Glori Bickers  Total critical care time: 35 minutes  Critical care time was exclusive of separately billable procedures and treating other patients.  Critical care was necessary to treat or prevent imminent or life-threatening deterioration.  Critical care was time spent personally by me (independent of midlevel providers or residents) on the following activities: development of treatment plan with patient and/or surrogate as well as nursing, discussions with consultants, evaluation of patient's response to treatment, examination of patient, obtaining history from patient or surrogate, ordering and performing treatments and interventions,  ordering and review of laboratory studies, ordering and review of radiographic studies, pulse oximetry and re-evaluation of patient's condition.    Length of Stay: 1  Glori Bickers, MD  09/02/2020, 12:10 PM  Advanced Heart Failure Team Pager [phone] (M-F; 7a - 5p)  Please contact [facility] for night-coverage after hours (4p -7a ) and weekends on [email or web address]

## 2020-09-03 ENCOUNTER — Encounter (HOSPITAL_COMMUNITY): Payer: Self-pay | Admitting: Cardiology

## 2020-09-03 LAB — POCT I-STAT EG7
Acid-base deficit: 7 mmol/L — ABNORMAL HIGH (ref 0.0–2.0)
Bicarbonate: 18.8 mmol/L — ABNORMAL LOW (ref 20.0–28.0)
Calcium, Ion: 1.31 mmol/L (ref 1.15–1.40)
HCT: 37 % — ABNORMAL LOW (ref 39.0–52.0)
Hemoglobin: 12.6 g/dL — ABNORMAL LOW (ref 13.0–17.0)
O2 Saturation: 57 %
Potassium: 4 mmol/L (ref 3.5–5.1)
Sodium: 134 mmol/L — ABNORMAL LOW (ref 135–145)
TCO2: 20 mmol/L — ABNORMAL LOW (ref 22–32)
pCO2, Ven: 36.2 mmHg — ABNORMAL LOW (ref 44.0–60.0)
pH, Ven: 7.323 (ref 7.250–7.430)
pO2, Ven: 32 mmHg (ref 32.0–45.0)

## 2020-09-03 LAB — BASIC METABOLIC PANEL
Anion gap: 9 (ref 5–15)
BUN: 23 mg/dL (ref 8–23)
CO2: 19 mmol/L — ABNORMAL LOW (ref 22–32)
Calcium: 8.3 mg/dL — ABNORMAL LOW (ref 8.9–10.3)
Chloride: 101 mmol/L (ref 98–111)
Creatinine, Ser: 1.69 mg/dL — ABNORMAL HIGH (ref 0.61–1.24)
GFR, Estimated: 44 mL/min — ABNORMAL LOW (ref >60)
Glucose, Bld: 176 mg/dL — ABNORMAL HIGH (ref 70–99)
Potassium: 3.6 mmol/L (ref 3.5–5.1)
Sodium: 129 mmol/L — ABNORMAL LOW (ref 135–145)

## 2020-09-03 LAB — GLUCOSE, CAPILLARY
Glucose-Capillary: 115 mg/dL — ABNORMAL HIGH (ref 70–99)
Glucose-Capillary: 128 mg/dL — ABNORMAL HIGH (ref 70–99)
Glucose-Capillary: 100 mg/dL — ABNORMAL HIGH (ref 70–99)
Glucose-Capillary: 171 mg/dL — ABNORMAL HIGH (ref 70–99)

## 2020-09-03 LAB — CBC
HCT: 34.5 % — ABNORMAL LOW (ref 39.0–52.0)
Hemoglobin: 11.6 g/dL — ABNORMAL LOW (ref 13.0–17.0)
MCH: 31.8 pg (ref 26.0–34.0)
MCHC: 33.6 g/dL (ref 30.0–36.0)
MCV: 94.5 fL (ref 80.0–100.0)
Platelets: 185 10*3/uL (ref 150–400)
RBC: 3.65 MIL/uL — ABNORMAL LOW (ref 4.22–5.81)
RDW: 16.9 % — ABNORMAL HIGH (ref 11.5–15.5)
WBC: 12.4 10*3/uL — ABNORMAL HIGH (ref 4.0–10.5)
nRBC: 0 % (ref 0.0–0.2)

## 2020-09-03 LAB — POCT I-STAT 7, (LYTES, BLD GAS, ICA,H+H)
Acid-base deficit: 8 mmol/L — ABNORMAL HIGH (ref 0.0–2.0)
Bicarbonate: 16.4 mmol/L — ABNORMAL LOW (ref 20.0–28.0)
Calcium, Ion: 1.27 mmol/L (ref 1.15–1.40)
HCT: 34 % — ABNORMAL LOW (ref 39.0–52.0)
Hemoglobin: 11.6 g/dL — ABNORMAL LOW (ref 13.0–17.0)
O2 Saturation: 95 %
Potassium: 3.7 mmol/L (ref 3.5–5.1)
Sodium: 134 mmol/L — ABNORMAL LOW (ref 135–145)
TCO2: 17 mmol/L — ABNORMAL LOW (ref 22–32)
pCO2 arterial: 29.6 mmHg — ABNORMAL LOW (ref 32.0–48.0)
pH, Arterial: 7.352 (ref 7.350–7.450)
pO2, Arterial: 79 mmHg — ABNORMAL LOW (ref 83.0–108.0)

## 2020-09-03 LAB — COOXEMETRY PANEL
Carboxyhemoglobin: 1 % (ref 0.5–1.5)
Methemoglobin: 1.2 % (ref 0.0–1.5)
O2 Saturation: 73 %
Total hemoglobin: 11.4 g/dL — ABNORMAL LOW (ref 12.0–16.0)

## 2020-09-03 LAB — HEPARIN LEVEL (UNFRACTIONATED): Heparin Unfractionated: 0.99 IU/mL — ABNORMAL HIGH (ref 0.30–0.70)

## 2020-09-03 LAB — POCT ACTIVATED CLOTTING TIME: Activated Clotting Time: 172 seconds

## 2020-09-03 MED ORDER — POTASSIUM CHLORIDE CRYS ER 20 MEQ PO TBCR
40.0000 meq | EXTENDED_RELEASE_TABLET | Freq: Once | ORAL | Status: AC
  Administered 2020-09-03: 40 meq via ORAL
  Filled 2020-09-03: qty 2

## 2020-09-03 MED FILL — Nitroglycerin IV Soln 100 MCG/ML in D5W: INTRA_ARTERIAL | Qty: 10 | Status: AC

## 2020-09-03 NOTE — Progress Notes (Addendum)
Advanced Heart Failure Rounding Note  PCP-Cardiologist: Shelva Majestic, MD   Subjective:   Cath 3/12  Severe 3v native CAD - LIMA ok - SVG-> PDA 100% (chronic) - SVG -> OM-1 -> OM-2 100% flush occlusion (culpit)  - not PCI target - EF 25-35% LVEDP 35 - PA 47/19 (29) mmHg, PCWP mean 22 mmHg PA sat 57%, ao sat 95% Fick 3.5/1.9 TD 3.0/1.7  Post cath started on IV lasix + milrinone 0.125. Weight down another 5 pounds.   Creatinine trending up 1.4>1.7   Swan#s  PA 33/12 (20)  CO 4.2  CI 2.4  CO-OX 73%  CVP 3  Denies SOB or CP. Complaining of intermittent arm ache.    Objective:   Weight Range: 63.3 kg Body mass index is 21.22 kg/m.   Vital Signs:   Temp:  [97.88 F (36.6 C)-99.5 F (37.5 C)] 98.24 F (36.8 C) (03/14 0730) Pulse Rate:  [75-92] 85 (03/14 0730) Resp:  [18-38] 22 (03/14 0730) BP: (85-143)/(42-127) 113/76 (03/14 0730) SpO2:  [94 %-100 %] 100 % (03/14 0730) Weight:  [63.3 kg] 63.3 kg (03/14 0458) Last BM Date:  (PTA)  Weight change: Filed Weights   09/01/20 0109 09/02/20 0500 09/03/20 0458  Weight: 63.8 kg 66 kg 63.3 kg    Intake/Output:   Intake/Output Summary (Last 24 hours) at 09/03/2020 0913 Last data filed at 09/03/2020 0700 Gross per 24 hour  Intake 1295.71 ml  Output 3200 ml  Net -1904.29 ml      Physical Exam    General:  . No resp difficulty HEENT: Normal Neck: Supple. JVP flat . Carotids 2+ bilat; no bruits. No lymphadenopathy or thyromegaly appreciated. Cor: PMI nondisplaced. Regular rate & rhythm. No rubs, gallops or murmurs. Lungs: Clear Abdomen: Soft, nontender, nondistended. No hepatosplenomegaly. No bruits or masses. Good bowel sounds. Extremities: No cyanosis, clubbing, rash, edema. L femoral swan  Neuro: Alert & orientedx3, cranial nerves grossly intact. moves all 4 extremities w/o difficulty. Affect pleasant   Telemetry   SR 80s . No VT  EKG    N/A   Labs    CBC Recent Labs    09/02/20 0936  09/03/20 0450  WBC 13.0* 12.4*  HGB 12.0* 11.6*  HCT 34.7* 34.5*  MCV 94.6 94.5  PLT 197 017   Basic Metabolic Panel Recent Labs    09/02/20 0936 09/03/20 0450  NA 129* 129*  K 3.4* 3.6  CL 102 101  CO2 18* 19*  GLUCOSE 114* 176*  BUN 16 23  CREATININE 1.43* 1.69*  CALCIUM 7.9* 8.3*   Liver Function Tests No results for input(s): AST, ALT, ALKPHOS, BILITOT, PROT, ALBUMIN in the last 72 hours. No results for input(s): LIPASE, AMYLASE in the last 72 hours. Cardiac Enzymes No results for input(s): CKTOTAL, CKMB, CKMBINDEX, TROPONINI in the last 72 hours.  BNP: BNP (last 3 results) No results for input(s): BNP in the last 8760 hours.  ProBNP (last 3 results) No results for input(s): PROBNP in the last 8760 hours.   D-Dimer No results for input(s): DDIMER in the last 72 hours. Hemoglobin A1C Recent Labs    09/02/20 0936  HGBA1C 5.9*   Fasting Lipid Panel Recent Labs    09/01/20 1058  CHOL 159  HDL 44  LDLCALC 86  TRIG 146  CHOLHDL 3.6   Thyroid Function Tests No results for input(s): TSH, T4TOTAL, T3FREE, THYROIDAB in the last 72 hours.  Invalid input(s): FREET3  Other results:   Imaging  ECHOCARDIOGRAM COMPLETE  Result Date: 09/02/2020    ECHOCARDIOGRAM REPORT   Patient Name:   Jason [name] Date of Exam: 09/02/2020 Medical Rec #:  [id number]         Height:       68.0 in Accession #:    [id number]        Weight:       145.5 lb Date of Birth:  05-11-1951         BSA:          1.785 m Patient Age:    70 years          BP:           108/56 mmHg Patient Gender: M                 HR:           77 bpm. Exam Location:  Inpatient Procedure: 2D Echo, Cardiac Doppler and Color Doppler Indications:    Acute MI  History:        Patient has prior history of Echocardiogram examinations, most                 recent 09/12/2016. CHF, CAD and Acute MI, Prior CABG, Stroke,                 COPD, PAD and Carotid Disease, Arrythmias:RBBB,                  Signs/Symptoms:Chest Pain; Risk Factors:Current Smoker,                 Hypertension, Diabetes and Dyslipidemia. Substance abuse.  Sonographer:    Dustin Flock Referring Phys: Val[place]  1. Posterolateral and anterolateral akinesis. Akinesis of the basal to mid anterior myocardium with hypokinesis of the apical anterior myocardium. Hypokinesis of the basal inferior myocardium.. Left ventricular ejection fraction, by estimation, is 25 to  30%. The left ventricle has severely decreased function. The left ventricle demonstrates regional wall motion abnormalities (see scoring diagram/findings for description). There is mild concentric left ventricular hypertrophy. Left ventricular diastolic  parameters are consistent with Grade I diastolic dysfunction (impaired relaxation).  2. Right ventricular systolic function is normal. The right ventricular size is normal. There is normal pulmonary artery systolic pressure.  3. Left atrial size was mildly dilated.  4. The mitral valve is normal in structure. Trivial mitral valve regurgitation. No evidence of mitral stenosis.  5. The aortic valve is calcified. There is moderate calcification of the aortic valve. There is moderate thickening of the aortic valve. Aortic valve regurgitation is not visualized. No aortic stenosis is present.  6. The inferior vena cava is normal in size with greater than 50% respiratory variability, suggesting right atrial pressure of 3 mmHg. FINDINGS  Left Ventricle: Posterolateral and anterolateral akinesis. Akinesis of the basal to mid anterior myocardium with hypokinesis of the apical anterior myocardium. Hypokinesis of the basal inferior myocardium. Left ventricular ejection fraction, by estimation, is 25 to 30%. The left ventricle has severely decreased function. The left ventricle demonstrates regional wall motion abnormalities. The left ventricular internal cavity size was normal in size. There is mild concentric left  ventricular hypertrophy. Left ventricular diastolic parameters are consistent with Grade I diastolic dysfunction (impaired relaxation). Indeterminate filling pressures. Right Ventricle: The right ventricular size is normal. No increase in right ventricular wall thickness. Right ventricular systolic function is normal. There is normal pulmonary artery systolic pressure. The tricuspid regurgitant velocity is 1.38  m/s, and  with an assumed right atrial pressure of 3 mmHg, the estimated right ventricular systolic pressure is 22.9 mmHg. Left Atrium: Left atrial size was mildly dilated. Right Atrium: Right atrial size was normal in size. Pericardium: There is no evidence of pericardial effusion. Mitral Valve: The mitral valve is normal in structure. Mild mitral annular calcification. Trivial mitral valve regurgitation. No evidence of mitral valve stenosis. Tricuspid Valve: The tricuspid valve is normal in structure. Tricuspid valve regurgitation is trivial. No evidence of tricuspid stenosis. Aortic Valve: The aortic valve is calcified. There is moderate calcification of the aortic valve. There is moderate thickening of the aortic valve. Aortic valve regurgitation is not visualized. No aortic stenosis is present. Pulmonic Valve: The pulmonic valve was normal in structure. Pulmonic valve regurgitation is not visualized. No evidence of pulmonic stenosis. Aorta: The aortic root is normal in size and structure. Venous: The inferior vena cava is normal in size with greater than 50% respiratory variability, suggesting right atrial pressure of 3 mmHg. IAS/Shunts: No atrial level shunt detected by color flow Doppler.  LEFT VENTRICLE PLAX 2D LVIDd:         4.80 cm  Diastology LVIDs:         4.10 cm  LV e' medial:    6.64 cm/s LV PW:         1.10 cm  LV E/e' medial:  9.0 LV IVS:        1.30 cm  LV e' lateral:   10.90 cm/s LVOT diam:     2.00 cm  LV E/e' lateral: 5.5 LV SV:         40 LV SV Index:   22 LVOT Area:     3.14 cm  RIGHT  VENTRICLE RV Basal diam:  2.60 cm RV S prime:     6.20 cm/s TAPSE (M-mode): 2.0 cm LEFT ATRIUM             Index       RIGHT ATRIUM           Index LA diam:        3.60 cm 2.02 cm/m  RA Area:     11.50 cm LA Vol (A2C):   36.0 ml 20.16 ml/m RA Volume:   24.70 ml  13.84 ml/m LA Vol (A4C):   52.4 ml 29.35 ml/m LA Biplane Vol: 47.5 ml 26.61 ml/m  AORTIC VALVE LVOT Vmax:   63.30 cm/s LVOT Vmean:  39.700 cm/s LVOT VTI:    0.127 m  AORTA Ao Root diam: 2.90 cm MITRAL VALVE               TRICUSPID VALVE MV Area (PHT): 4.49 cm    TR Peak grad:   7.6 mmHg MV Decel Time: 169 msec    TR Vmax:        138.00 cm/s MV E velocity: 59.60 cm/s MV A velocity: 87.80 cm/s  SHUNTS MV E/A ratio:  0.68        Systemic VTI:  0.13 m                            Systemic Diam: 2.00 cm Skeet Latch MD Electronically signed by Skeet Latch MD Signature Date/Time: 09/02/2020/1:35:37 PM    Final       Medications:     Scheduled Medications: . aspirin EC  81 mg Oral Daily  . Chlorhexidine Gluconate Cloth  6 each Topical Daily  .  Chlorhexidine Gluconate Cloth  6 each Topical Daily  . clopidogrel  75 mg Oral Q breakfast  . furosemide  80 mg Intravenous BID  . gabapentin  600 mg Oral BID  . levothyroxine  50 mcg Oral Q0600  . nicotine  21 mg Transdermal Daily  . pantoprazole  40 mg Oral Daily  . ranolazine  1,000 mg Oral BID  . rosuvastatin  20 mg Oral Daily  . sodium chloride flush  10-40 mL Intracatheter Q12H  . sodium chloride flush  3 mL Intravenous Q12H     Infusions: . sodium chloride 10 mL/hr at 09/01/20 2024  . sodium chloride    . sodium chloride 10 mL/hr at 09/01/20 2025  . sodium chloride 20 mL/hr at 09/03/20 0700  . sodium chloride 10 mL/hr at 09/01/20 2025  . heparin 1,000 Units/hr (09/03/20 0700)  . milrinone 0.125 mcg/kg/min (09/03/20 0700)     PRN Medications:  sodium chloride, acetaminophen, nitroGLYCERIN, ondansetron (ZOFRAN) IV, sodium chloride flush, sodium chloride  flush    Patient Profile   Mr.Jason [name] is a 70 y/o malewithPAD, HTN, DM2, CAD s/p CABG in 2011 and PCI to SVG in 2018. Admitted 3/12 with NSTEMI. hstrop 11k.   Cath with severe 3V CAD   Assessment/Plan   1. CAD/NSTEMI - s/p CABG 2011 - hstrop 11.5K - cath 3/12 results as above. Culprit lesion flush occlusion of SVG-> OM1-> OM2 (not PCI target) - Medical management  - DAPT/statin. Continue crestor to 40  - Continue ranexa 1000 mg twice a day.  - Stop heparin today.  - CR consult  2. Acute systolic HF due to iCM/NSTEMI -> cardiogenic shock - EF 25-35% by V-gram  - Echo 3/13 EF 35-40% (read formally as 25-30%) - Co-ox 73% on milrinone 0.125. Stop milrinone.  - CVP 3. Stop IV lasix - Hold spiro.   - no b-blocker yet with low output - likely ARB in am - SGLT2i prior to d/c  3. DM2 - cover with SSI.  - start SGLT2i prior to d/c   4. AKI - ? ATN vs contrast nepropathy - Creatinine baseline 1.1 -1.2 - today 1.4>1.7 / Repeat BMET in am.  - Stop IV lasix.   - Follow. Avoid nephrotoxic agents  5. Tobacco use - on nicotine patch. - encouraged to quit  6. Hypokalemia - supp  - Hold off on spiro with elevated creatinine.   7. Hyponatremia - Continue to restrict free water   Length of Stay: 2  Darrick Grinder, NP  09/03/2020, 9:13 AM  Advanced Heart Failure Team Pager [phone] (M-F; 7a - 5p)  Please contact [facility] for night-coverage after hours (4p -7a ) and weekends on [email or web address]  Agree with above.   Remains on milrinone. Swan numbers and co-ox much improved. He has diuresed well. Creatinine up slightly. No CP. Echo EF ~ 35%   General:  Sitting up in bed . No resp difficulty HEENT: normal Neck: supple. no JVD. Carotids 2+ bilat; no bruits. No lymphadenopathy or thryomegaly appreciated. Cor: PMI nondisplaced. Regular rate & rhythm. No rubs, gallops or murmurs. Lungs: clear Abdomen: soft, nontender, nondistended. No hepatosplenomegaly. No bruits or  masses. Good bowel sounds. Extremities: no cyanosis, clubbing, rash, edema RFV swan  Neuro: alert & orientedx3, cranial nerves grossly intact. moves all 4 extremities w/o difficulty. Affect pleasant  Will pull swan today. Can stop milrinone and heparin. Discussed dosing with PharmD personally. Ambulate as tolerated. Can go to SDU.   CRITICAL CARE Performed by: Glori Bickers  Total critical care time: 35 minutes  Critical care time was exclusive of separately billable procedures and treating other patients.  Critical care was necessary to treat or prevent imminent or life-threatening deterioration.  Critical care was time spent personally by me (independent of midlevel providers or residents) on the following activities: development of treatment plan with patient and/or surrogate as well as nursing, discussions with consultants, evaluation of patient's response to treatment, examination of patient, obtaining history from patient or surrogate, ordering and performing treatments and interventions, ordering and review of laboratory studies, ordering and review of radiographic studies, pulse oximetry and re-evaluation of patient's condition.  Glori Bickers, MD  10:30 AM

## 2020-09-03 NOTE — Progress Notes (Addendum)
[facility] for Heparin Indication: chest pain/ACS  Allergies  Allergen Reactions  . Coreg [Carvedilol] Nausea And Vomiting and Other (See Comments)    Per patient made him dizzy and light sensitive  . Sulfa Antibiotics Nausea And Vomiting and Other (See Comments)    Also headaches     Patient Measurements: Height: 5\' 8"  (172.7 cm) Weight: 63.3 kg (139 lb 8.8 oz) IBW/kg (Calculated) : 68.4 Vital Signs: Temp: 98.06 F (36.7 C) (03/14 0600) Temp Source: Core (03/14 0400) BP: 100/56 (03/14 0600) Pulse Rate: 78 (03/14 0600)  Labs: Recent Labs    09/01/20 0117 09/01/20 0312 09/01/20 0437 09/01/20 1058 09/02/20 0936 09/02/20 1228 09/02/20 2020 09/03/20 0450  HGB 13.6  --  12.9*  --  12.0*  --   --  11.6*  HCT 43.2  --  39.4  --  34.7*  --   --  34.5*  PLT 263  --  263  --  197  --   --  185  LABPROT  --   --  13.6  --   --   --   --   --   INR  --   --  1.1  --   --   --   --   --   HEPARINUNFRC  --   --   --    < > 0.20* 0.15* 0.45 0.99*  CREATININE 1.36*  --  1.29*  --  1.43*  --   --  1.69*  TROPONINIHS 10,422* 11,585*  --   --   --   --   --   --    < > = values in this interval not displayed.    Estimated Creatinine Clearance: 37.5 mL/min (A) (by C-G formula based on SCr of 1.69 mg/dL (H)).  Assessment: 70 y.o. male with NSTEMI s/p cath for heparin  Goal of Therapy:  Heparin level 0.3-0.7 units/ml Monitor platelets by anticoagulation protocol: Yes   Plan:  Decrease Heparin 1000 units/hr Heparin off at noon today (s/p 48 hrs).  [place] Crane, Roylene Reason, BCCP Clinical Pharmacist  09/03/2020 10:21 AM   [facility] pharmacy phone numbers are listed on [email or web address]

## 2020-09-04 DIAGNOSIS — I5021 Acute systolic (congestive) heart failure: Secondary | ICD-10-CM

## 2020-09-04 LAB — CBC
HCT: 35.7 % — ABNORMAL LOW (ref 39.0–52.0)
Hemoglobin: 12.2 g/dL — ABNORMAL LOW (ref 13.0–17.0)
MCH: 31.7 pg (ref 26.0–34.0)
MCHC: 34.2 g/dL (ref 30.0–36.0)
MCV: 92.7 fL (ref 80.0–100.0)
Platelets: 194 10*3/uL (ref 150–400)
RBC: 3.85 MIL/uL — ABNORMAL LOW (ref 4.22–5.81)
RDW: 16.7 % — ABNORMAL HIGH (ref 11.5–15.5)
WBC: 11.6 10*3/uL — ABNORMAL HIGH (ref 4.0–10.5)
nRBC: 0 % (ref 0.0–0.2)

## 2020-09-04 LAB — BASIC METABOLIC PANEL
Anion gap: 10 (ref 5–15)
BUN: 21 mg/dL (ref 8–23)
CO2: 21 mmol/L — ABNORMAL LOW (ref 22–32)
Calcium: 8.5 mg/dL — ABNORMAL LOW (ref 8.9–10.3)
Chloride: 98 mmol/L (ref 98–111)
Creatinine, Ser: 1.45 mg/dL — ABNORMAL HIGH (ref 0.61–1.24)
GFR, Estimated: 52 mL/min — ABNORMAL LOW (ref >60)
Glucose, Bld: 165 mg/dL — ABNORMAL HIGH (ref 70–99)
Potassium: 3.9 mmol/L (ref 3.5–5.1)
Sodium: 129 mmol/L — ABNORMAL LOW (ref 135–145)

## 2020-09-04 LAB — GLUCOSE, CAPILLARY
Glucose-Capillary: 106 mg/dL — ABNORMAL HIGH (ref 70–99)
Glucose-Capillary: 174 mg/dL — ABNORMAL HIGH (ref 70–99)
Glucose-Capillary: 88 mg/dL (ref 70–99)
Glucose-Capillary: 127 mg/dL — ABNORMAL HIGH (ref 70–99)

## 2020-09-04 MED ORDER — ROSUVASTATIN CALCIUM 20 MG PO TABS
40.0000 mg | ORAL_TABLET | Freq: Every day | ORAL | Status: DC
  Administered 2020-09-04 – 2020-09-06 (×3): 40 mg via ORAL
  Filled 2020-09-04 (×3): qty 2

## 2020-09-04 MED ORDER — ENSURE ENLIVE PO LIQD: 237.0000 mL | Freq: Two times a day (BID) | ORAL | Status: DC

## 2020-09-04 MED ORDER — SPIRONOLACTONE 12.5 MG HALF TABLET
12.5000 mg | ORAL_TABLET | Freq: Every day | ORAL | Status: DC
Start: 2020-09-04 — End: 2020-09-06
  Administered 2020-09-04 – 2020-09-06 (×3): 12.5 mg via ORAL
  Filled 2020-09-04 (×3): qty 1

## 2020-09-04 MED ORDER — EMPAGLIFLOZIN 10 MG PO TABS
10.0000 mg | ORAL_TABLET | Freq: Every day | ORAL | Status: DC
  Administered 2020-09-04 – 2020-09-06 (×3): 10 mg via ORAL
  Filled 2020-09-04 (×3): qty 1

## 2020-09-04 MED ORDER — ADULT MULTIVITAMIN W/MINERALS CH
1.0000 | ORAL_TABLET | Freq: Every day | ORAL | Status: DC
  Administered 2020-09-04 – 2020-09-06 (×3): 1 via ORAL
  Filled 2020-09-04 (×3): qty 1

## 2020-09-04 MED ORDER — SORBITOL 70 % SOLN
30.0000 mL | Freq: Once | Status: AC
  Administered 2020-09-04: 30 mL via ORAL
  Filled 2020-09-04: qty 30

## 2020-09-04 NOTE — Progress Notes (Signed)
CARDIAC REHAB PHASE I   PRE:  Rate/Rhythm: 81 SR   BP:  Supine:   Sitting: 118/67  Standing:    SaO2: 97%RA  MODE:  Ambulation: 370 ft   POST:  Rate/Rhythm: 98 SR  BP:  Supine:   Sitting: 142/70  Standing:    SaO2: 92%RA 1300-1415 Pt called me by name when I walked in room as I have seen him several times in past. Pt walked 370 ft on RA with gait belt use, rolling walker and asst x 1. Gait steady, not sure of sats as hard to read but pt tolerated well. Once back in room and resting, sats at 92% on ear. No c/o CP. Stated a little SOB at end of walk. MI and CHF ed completed with pt who voiced understanding. Reviewed NTG use, MI restrictions, low sodium diet of 2000 mg, daily weights and when to call MD, signs of CHF, and CRP 2. Will refer to [place] CRP 2. Pt does not have scales at home. Discussed importance of daily weights. Hopeful that we can provide scales. To recliner after walk with call bell. Also discussed smoking cessation and calling [email or web address]. Doubtful he will be able to quit. Gave smoking cessation handout.   Graylon Good, RN BSN  09/04/2020 2:17 PM   3

## 2020-09-04 NOTE — Progress Notes (Addendum)
Advanced Heart Failure Rounding Note  PCP-Cardiologist: Shelva Majestic, MD   Subjective:   Cath 3/12  Severe 3v native CAD - LIMA ok - SVG-> PDA 100% (chronic) - SVG -> OM-1 -> OM-2 100% flush occlusion (culpit)  - not PCI target - EF 25-35% LVEDP 35 - PA 47/19 (29) mmHg, PCWP mean 22 mmHg PA sat 57%, ao sat 95% Fick 3.5/1.9 TD 3.0/1.7 3/14 Milrinone stopped.   Feeling better. Denies SOB/chest pain.     Objective:   Weight Range: 64 kg Body mass index is 21.45 kg/m.   Vital Signs:   Temp:  [97.6 F (36.4 C)-99.14 F (37.3 C)] 97.8 F (36.6 C) (03/15 0338) Pulse Rate:  [72-98] 82 (03/15 0800) Resp:  [16-30] 25 (03/15 0800) BP: (92-140)/(49-97) 125/65 (03/15 0800) SpO2:  [89 %-100 %] 100 % (03/15 0800) Weight:  [64 kg] 64 kg (03/15 0500) Last BM Date:  (PTA)  Weight change: Filed Weights   09/02/20 0500 09/03/20 0458 09/04/20 0500  Weight: 66 kg 63.3 kg 64 kg    Intake/Output:   Intake/Output Summary (Last 24 hours) at 09/04/2020 0826 Last data filed at 09/04/2020 0600 Gross per 24 hour  Intake 200.75 ml  Output 4000 ml  Net -3799.25 ml      Physical Exam    General:  Sitting in the chair. No resp difficulty HEENT: normal Neck: supple. JVP 6-7 . Carotids 2+ bilat; no bruits. No lymphadenopathy or thryomegaly appreciated. Cor: PMI nondisplaced. Regular rate & rhythm. No rubs, gallops or murmurs. Lungs: Crackles in the bases Abdomen: soft, nontender, nondistended. No hepatosplenomegaly. No bruits or masses. Good bowel sounds. Extremities: no cyanosis, clubbing, rash, edema. R groin ok.  Neuro: alert & orientedx3, cranial nerves grossly intact. moves all 4 extremities w/o difficulty. Affect pleasant   Telemetry  SR 80-90s    EKG    N/A   Labs    CBC Recent Labs    09/03/20 0450 09/04/20 0030  WBC 12.4* 11.6*  HGB 11.6* 12.2*  HCT 34.5* 35.7*  MCV 94.5 92.7  PLT 185 130   Basic Metabolic Panel Recent Labs    09/03/20 0450  09/04/20 0030  NA 129* 129*  K 3.6 3.9  CL 101 98  CO2 19* 21*  GLUCOSE 176* 165*  BUN 23 21  CREATININE 1.69* 1.45*  CALCIUM 8.3* 8.5*   Liver Function Tests No results for input(s): AST, ALT, ALKPHOS, BILITOT, PROT, ALBUMIN in the last 72 hours. No results for input(s): LIPASE, AMYLASE in the last 72 hours. Cardiac Enzymes No results for input(s): CKTOTAL, CKMB, CKMBINDEX, TROPONINI in the last 72 hours.  BNP: BNP (last 3 results) No results for input(s): BNP in the last 8760 hours.  ProBNP (last 3 results) No results for input(s): PROBNP in the last 8760 hours.   D-Dimer No results for input(s): DDIMER in the last 72 hours. Hemoglobin A1C Recent Labs    09/02/20 0936  HGBA1C 5.9*   Fasting Lipid Panel Recent Labs    09/01/20 1058  CHOL 159  HDL 44  LDLCALC 86  TRIG 146  CHOLHDL 3.6   Thyroid Function Tests No results for input(s): TSH, T4TOTAL, T3FREE, THYROIDAB in the last 72 hours.  Invalid input(s): FREET3  Other results:   Imaging    No results found.   Medications:     Scheduled Medications: . aspirin EC  81 mg Oral Daily  . Chlorhexidine Gluconate Cloth  6 each Topical Daily  . Chlorhexidine Gluconate Cloth  6 each Topical Daily  . clopidogrel  75 mg Oral Q breakfast  . gabapentin  600 mg Oral BID  . levothyroxine  50 mcg Oral Q0600  . nicotine  21 mg Transdermal Daily  . pantoprazole  40 mg Oral Daily  . ranolazine  1,000 mg Oral BID  . rosuvastatin  40 mg Oral Daily  . sodium chloride flush  10-40 mL Intracatheter Q12H  . sodium chloride flush  3 mL Intravenous Q12H  . spironolactone  12.5 mg Oral Daily    Infusions: . sodium chloride 10 mL/hr at 09/01/20 2024  . sodium chloride    . sodium chloride 10 mL/hr at 09/01/20 2025  . sodium chloride Stopped (09/03/20 1432)  . sodium chloride 10 mL/hr at 09/01/20 2025    PRN Medications: sodium chloride, acetaminophen, nitroGLYCERIN, ondansetron (ZOFRAN) IV, sodium chloride  flush, sodium chloride flush    Patient Profile   Mr.Tulloch is a 70 y/o malewithPAD, HTN, DM2, CAD s/p CABG in 2011 and PCI to SVG in 2018. Admitted 3/12 with NSTEMI. hstrop 11k.   Cath with severe 3V CAD   Assessment/Plan   1. CAD/NSTEMI - s/p CABG 2011 - hstrop 11.5K - cath 3/12 results as above. Culprit lesion flush occlusion of SVG-> OM1-> OM2 (not PCI target) - Medical management. No chest pain.  - DAPT/statin. Increase crestor to 40 mg daily  - Continue ranexa 1000 mg twice a day.  - 2. Acute systolic HF due to iCM/NSTEMI -> cardiogenic shock - EF 25-35% by V-gram  - Echo 3/13 EF 35-40% (read formally as 25-30%) - Volume status stable.  - Start 12.5 mg daily.    - no b-blocker yet with low output. Would use bisoprolol down the road.  - SGLT2i prior to d/c  3. DM2 - start SGLT2i prior to d/c   4. AKI - ? ATN vs contrast nepropathy - Creatinine baseline 1.1 -1.2 - Creatinine coming back down to 1.45  - Avoid nephrotoxic agents  5. Tobacco use - on nicotine patch. -Discussed smoking cessation.   6. Hypokalemia - Resolved.  - Starting spiro today.   7. Hyponatremia - Continue to restrict free water  Transfer to [facility] or [place] [facility].   Length of Stay: 3  Amy Clegg, NP  09/04/2020, 8:26 AM  Advanced Heart Failure Team Pager [phone] (M-F; 7a - 5p)  Please contact [facility] for night-coverage after hours (4p -7a ) and weekends on [email or web address]  Patient seen and examined with the above-signed Advanced Practice Provider and/or Housestaff. I personally reviewed laboratory data, imaging studies and relevant notes. I independently examined the patient and formulated the important aspects of the plan. I have edited the note to reflect any of my changes or salient points. I have personally discussed the plan with the patient and/or family.  Sitting up in chair. Off milrinone. Luiz Blare out. Denies CP or SOB. Creatinine improving.    General:  Well appearing. No resp difficulty HEENT: normal Neck: supple. no JVD. Carotids 2+ bilat; no bruits. No lymphadenopathy or thryomegaly appreciated. Cor: PMI nondisplaced. Regular rate & rhythm. No rubs, gallops or murmurs. Lungs: clear. Decreased throughout  Abdomen: soft, nontender, nondistended. No hepatosplenomegaly. No bruits or masses. Good bowel sounds. Extremities: no cyanosis, clubbing, rash, edema Neuro: alert & orientedx3, cranial nerves grossly intact. moves all 4 extremities w/o difficulty. Affect pleasant  Feeling better but still somewhat tenuous. No angina. Volume status ok. Agree with spiro and Jardiance. No b-blocker yet but will  try to start prior to d/c. Continue DAPT/statin. CR to see.   Glori Bickers, MD  9:17 AM

## 2020-09-04 NOTE — Progress Notes (Signed)
Initial Nutrition Assessment  DOCUMENTATION CODES:   Severe malnutrition in context of chronic illness  INTERVENTION:   Allow double portions at meal times with snacks between meals-pt is content with receiving snacks from floor stock at this time  Add MVI with Minerals  Trial Ensure Enlive po BID, each supplement provides 350 kcal and 20 grams of protein   NUTRITION DIAGNOSIS:   Severe Malnutrition related to chronic illness as evidenced by severe muscle depletion,severe fat depletion.   GOAL:   Patient will meet greater than or equal to 90% of their needs   MONITOR:   PO intake,Supplement acceptance,Labs,Weight trends  REASON FOR ASSESSMENT:   Malnutrition Screening Tool    ASSESSMENT:   70 yo male with severe 3v CAD and admitted with NSTEMI, acute CHF with cardiogenic shock. PMH includes PAD, HTN, DM, CAD/CABG, GERD with stricture, basal cell skin cancer, COPD. +smoker   Pt with good appetite at present, eating 100% of meals, still hungry and asking for snacks. Plan to allow double portions  Pt reports at home he eats 1 meal per day, usually around 530 or 6 pm. Pt fixes him self a "[place] style" meal with lots of veggies (corn, green beans and potatoes) and usually some kind of protein. Pt also likes to eat spaghetti or chilli. Sometimes pt will also eat a egg sandwich for breakfast. Pt does not snack a lot but if he does it will be something like cheese and crackers. Pt does not use any oral nutrition supplements at home and does not think he would be interested in them now  Pt reports admission around Christmas last year for "stomach issues" that included vomiting, burping. Pt reports a diagnosis was not found and he is not currently following with an MD for this. Pt reports he still periodically has burping with terrible taste for several days at a time  but now has diarrhea mostly, instead of vomiting. Pt reports at this time he lost from 165 pounds to 139 pounds;  16% wt loss. Current wt 141 pounds.   Noted MD recommending continuing free water restriction however pt not currently on fluid restriction. Na 129.   Upon performing Nutrition Focused Physical Exam, pt reports the "divets" in his temple are from skin cancer; upon further questioning, when asked if he had the skin cancer removed , he said no. He also said a doctor was not following him for this. Pt does have a history of basal cell carcinoma, pt noted a large section on his left arm that was removed due to skin cancer. Let MD know about this as well  Labs: sodium 129 (L), Creatinine 1.45, CBGs 100-174 Meds: reviewed   NUTRITION - FOCUSED PHYSICAL EXAM:  Flowsheet Row Most Recent Value  Orbital Region Severe depletion  Upper Arm Region Severe depletion  Thoracic and Lumbar Region Severe depletion  Buccal Region Severe depletion  Temple Region Severe depletion  Clavicle Bone Region Severe depletion  Clavicle and Acromion Bone Region Severe depletion  Scapular Bone Region Moderate depletion  Dorsal Hand Severe depletion  Patellar Region Severe depletion  Anterior Thigh Region Moderate depletion  Posterior Calf Region Moderate depletion  Edema (RD Assessment) None  Skin --  [pt reports lesions on his temples are from skin cancer-pt reports he is not seeing at MD for this and they have not been treated]       Diet Order:   Diet Order            Diet  Carb Modified Fluid consistency: Thin; Room service appropriate? Yes  Diet effective now                 EDUCATION NEEDS:   Education needs have been addressed  Skin:  Skin Assessment: Skin Integrity Issues:  Last BM:  PTA  Height:   Ht Readings from Last 1 Encounters:  09/01/20 5\' 8"  (1.727 m)    Weight:   Wt Readings from Last 1 Encounters:  09/04/20 64 kg    BMI:  Body mass index is 21.45 kg/m.  Estimated Nutritional Needs:   Kcal:  2100-2300 kcals  Protein:  105-120 g  Fluid:  1.6 L   Kerman Passey MS,  RDN, LDN, CNSC Registered Dietitian III Clinical Nutrition RD Pager and On-Call Pager Number Located in [place]

## 2020-09-04 NOTE — Hospital Course (Signed)
Jason [name] is a 70 y/o male with PAD, HTN, DM2, CAD s/p CABG in 2011 and PCI to SVG in 2018. Admitted 3/12 with NSTEMI. hstrop 11k.Taken to cath lab on 3/12. Cath showed severe 3v native CAD. Post cath started on IV lasix and milrinone. Milrinone gradually weaned off. Placed on GDMT.  -  Unable to open SVG. Started milrinone and IV lasix and moved to ICU.   1. CAD/NSTEMI - s/p CABG 2011 - hstrop 11.5K - cath 3/12 results as above. Culprit lesion flush occlusion of SVG-> OM1-> OM2 (not PCI target) - Medical management. No chest pain.  - DAPT/statin. Increase crestor to 40 mg daily  - Continue ranexa 1000 mg twice a day.  -  2. Acute systolic HF due to iCM/NSTEMI -> cardiogenic shock - EF 25-35% by V-gram  - Echo 3/13 EF 35-40% (read formally as 25-30%) - Volume status stable.  - GDMT initiated - Start 12.5 mg daily.    - Started on bisoprolol with COPD.  - Started on jardiance   3. DM2 - start SGLT2i prior to d/c    4. AKI - ? ATN vs contrast nepropathy - Creatinine baseline 1.1 -1.2 -   5. Tobacco use - on nicotine patch. -Discussed smoking cessation.    6. Hypokalemia - Resolved.  - Starting spiro today.    7. Hyponatremia - Continue to restrict free water

## 2020-09-04 NOTE — Care Management (Signed)
1724 09-04-20 Case manager received a consult for Jardiance. Benefits check submitted and Case Manager will follow for cost. Graves-Bigelow, Ocie Cornfield , RN, BSN Case Manager

## 2020-09-05 DIAGNOSIS — E43 Unspecified severe protein-calorie malnutrition: Secondary | ICD-10-CM | POA: Insufficient documentation

## 2020-09-05 LAB — CBC
HCT: 35.8 % — ABNORMAL LOW (ref 39.0–52.0)
Hemoglobin: 12.6 g/dL — ABNORMAL LOW (ref 13.0–17.0)
MCH: 32.1 pg (ref 26.0–34.0)
MCHC: 35.2 g/dL (ref 30.0–36.0)
MCV: 91.3 fL (ref 80.0–100.0)
Platelets: 223 10*3/uL (ref 150–400)
RBC: 3.92 MIL/uL — ABNORMAL LOW (ref 4.22–5.81)
RDW: 16.5 % — ABNORMAL HIGH (ref 11.5–15.5)
WBC: 11 10*3/uL — ABNORMAL HIGH (ref 4.0–10.5)
nRBC: 0 % (ref 0.0–0.2)

## 2020-09-05 LAB — BASIC METABOLIC PANEL
Anion gap: 12 (ref 5–15)
BUN: 18 mg/dL (ref 8–23)
CO2: 19 mmol/L — ABNORMAL LOW (ref 22–32)
Calcium: 8.9 mg/dL (ref 8.9–10.3)
Chloride: 97 mmol/L — ABNORMAL LOW (ref 98–111)
Creatinine, Ser: 1.36 mg/dL — ABNORMAL HIGH (ref 0.61–1.24)
GFR, Estimated: 57 mL/min — ABNORMAL LOW (ref >60)
Glucose, Bld: 152 mg/dL — ABNORMAL HIGH (ref 70–99)
Potassium: 4.2 mmol/L (ref 3.5–5.1)
Sodium: 128 mmol/L — ABNORMAL LOW (ref 135–145)

## 2020-09-05 LAB — GLUCOSE, CAPILLARY
Glucose-Capillary: 131 mg/dL — ABNORMAL HIGH (ref 70–99)
Glucose-Capillary: 112 mg/dL — ABNORMAL HIGH (ref 70–99)
Glucose-Capillary: 148 mg/dL — ABNORMAL HIGH (ref 70–99)
Glucose-Capillary: 134 mg/dL — ABNORMAL HIGH (ref 70–99)

## 2020-09-05 MED ORDER — BISOPROLOL FUMARATE 5 MG PO TABS
2.5000 mg | ORAL_TABLET | Freq: Every day | ORAL | Status: DC
  Administered 2020-09-05: 2.5 mg via ORAL
  Filled 2020-09-05: qty 1

## 2020-09-05 NOTE — TOC Benefit Eligibility Note (Signed)
Transition of Care [facility]) Benefit Eligibility Note    Patient Details  Name: Jason [name] MRN: [id number] Date of Birth: 11-29-1950   Medication/Dose: Vania Rea 10mg  qd  Covered?: Yes     Prescription Coverage Preferred Pharmacy: most major retail pharmacies  Spoke with Person/Company/Phone Number:: [employer]  Co-Pay: $47 for 30 day retail  Prior Approval: No          Delorse Lek Phone Number: 09/05/2020, 10:49 AM

## 2020-09-05 NOTE — Evaluation (Signed)
Physical Therapy Evaluation Patient Details Name: Jason [name] MRN: [id number] DOB: 08/20/1950 Today's Date: 09/05/2020   History of Present Illness  Pt is 70 yo male admitted 09/01/20 with NSTEMI cath with severe 3 vessel CAD.  Pt with PMH including PAD, HTN, DM2, CAD s/p CABG in 2011 and PCI to SVG in 2018, R foot digit 1 and 2 amputation, chronic low back pain.  Clinical Impression  Pt admitted with above diagnosis. Pt resides at home alone and is normally fairly independent but does have some difficulty due to chronic low back pain.  He has a Industrial/product designer and brother who can assist at times with IADLs.  Pt was able to ambulate in hall with RW and no back pain - reports they have started him on neurotin and that seems to be helping.  He did have mild instability - continue to recommend use of RW, may also benefit from ambulation with shoes due to toe amputations. Pt does have steps to enter his home and would like to practice stairs next visit.  Expected to progress well with PT.  Pt currently with functional limitations due to the deficits listed below (see PT Problem List). Pt will benefit from skilled PT to increase their independence and safety with mobility to allow discharge to the venue listed below.       Follow Up Recommendations No PT follow up;Supervision - Intermittent (possible cardiac rehab)    Equipment Recommendations  None recommended by PT    Recommendations for Other Services       Precautions / Restrictions Precautions Precautions: Fall      Mobility  Bed Mobility               General bed mobility comments: in chair at arrival    Transfers Overall transfer level: Needs assistance Equipment used: None;Rolling walker (2 wheeled) Transfers: Sit to/from Stand Sit to Stand: Min guard         General transfer comment: min guard for safety; performed x 1 from chair with RW and x 1 without AD  Ambulation/Gait Ambulation/Gait assistance: Min guard Gait  Distance (Feet): 370 Feet Assistive device: Rolling walker (2 wheeled);None Gait Pattern/deviations: Step-through pattern;Decreased stride length Gait velocity: decreased   General Gait Details: Ambulated in hall with RW with cues for posture and RW proximity.  Demonstrated toe out and limited dorsiflexion on R (chronic).  Pt reports often ambulates without AD in home - attempted 67' ambulation in room without AD and pt requiring min guard, mild unsteadines, and reaching for furniture at times.  Stairs            Wheelchair Mobility    Modified Rankin (Stroke Patients Only)       Balance Overall balance assessment: Needs assistance Sitting-balance support: No upper extremity supported Sitting balance-Leahy Scale: Normal     Standing balance support: Bilateral upper extremity supported;No upper extremity supported;Single extremity supported Standing balance-Leahy Scale: Fair Standing balance comment: RW for ambulation in hall or had mild unsteadiness without support                             Pertinent Vitals/Pain Pain Assessment:  (Reports getting Neurotin and back not hurting)    Home Living Family/patient expects to be discharged to:: Private residence Living Arrangements: Alone Available Help at Discharge: Family;Available PRN/intermittently (brother lives nearby but works long hours - can assist with shopping/food) Type of Home: House Home Access: Stairs to enter  Entrance Stairs-Rails: Right;Left Entrance Stairs-Number of Steps: 7 Home Layout: One level Home Equipment: Walker - 2 wheels;Bedside commode;Shower seat;Grab bars - tub/shower      Prior Function Level of Independence: Needs assistance   Gait / Transfers Assistance Needed: Pt could ambulate in community some days (depending on back pain); does not typically use AD unless back pain flaired  ADL's / Homemaking Assistance Needed: Independent with ADLs; can do IADLs but needs frequent rest  breaks due to back pain  Comments: Reports chronic low back pain manges with tylenol/advil and CBD cream.  Increases with bending and walking.     Hand Dominance        Extremity/Trunk Assessment   Upper Extremity Assessment Upper Extremity Assessment: Overall WFL for tasks assessed    Lower Extremity Assessment Lower Extremity Assessment: RLE deficits/detail RLE Deficits / Details: chronic deficits due to prior pass and toe amputation; functional but with toe out and limited ankle dorsiflexion       Communication   Communication: No difficulties  Cognition Arousal/Alertness: Awake/alert Behavior During Therapy: WFL for tasks assessed/performed Overall Cognitive Status: Within Functional Limits for tasks assessed                                        General Comments General comments (skin integrity, edema, etc.): VSS on RA.  HR 80's-90's.  BP 106/90.  O2 sats difficult to read - poor pleth at rest and worse with ambulation.  When good pleth present O2 sats reading 92% or >.  Denied shortness of breath with activity    Exercises     Assessment/Plan    PT Assessment Patient needs continued PT services  PT Problem List Decreased strength;Decreased balance;Decreased knowledge of use of DME;Cardiopulmonary status limiting activity;Decreased activity tolerance;Decreased coordination;Decreased mobility;Decreased safety awareness       PT Treatment Interventions DME instruction;Therapeutic activities;Gait training;Therapeutic exercise;Patient/family education;Modalities;Stair training;Balance training;Functional mobility training    PT Goals (Current goals can be found in the Care Plan section)  Acute Rehab PT Goals Patient Stated Goal: return home; practice stairs before home PT Goal Formulation: With patient Time For Goal Achievement: 09/19/20 Potential to Achieve Goals: Good    Frequency Min 3X/week   Barriers to discharge Decreased caregiver  support      Co-evaluation               AM-PAC PT "6 Clicks" Mobility  Outcome Measure Help needed turning from your back to your side while in a flat bed without using bedrails?: A Little Help needed moving from lying on your back to sitting on the side of a flat bed without using bedrails?: A Little Help needed moving to and from a bed to a chair (including a wheelchair)?: A Little Help needed standing up from a chair using your arms (e.g., wheelchair or bedside chair)?: A Little Help needed to walk in hospital room?: A Little Help needed climbing 3-5 steps with a railing? : A Little 6 Click Score: 18    End of Session Equipment Utilized During Treatment: Gait belt Activity Tolerance: Patient tolerated treatment well Patient left: in chair;with call bell/phone within reach Nurse Communication: Mobility status PT Visit Diagnosis: Unsteadiness on feet (R26.81);Other abnormalities of gait and mobility (R26.89)    Time: 1035-1105 PT Time Calculation (min) (ACUTE ONLY): 30 min   Charges:   PT Evaluation $PT Eval Moderate Complexity: 1 Mod PT Treatments $  Gait Training: 8-22 mins        Abran Richard, PT Acute Rehab Services Pager [phone] [facility] 09/05/2020, 12:06 PM

## 2020-09-05 NOTE — Progress Notes (Addendum)
Advanced Heart Failure Rounding Note  PCP-Cardiologist: Shelva Majestic, MD   Subjective:   Cath 3/12  Severe 3v native CAD - LIMA ok - SVG-> PDA 100% (chronic) - SVG -> OM-1 -> OM-2 100% flush occlusion (culpit)  - not PCI target - EF 25-35% LVEDP 35 - PA 47/19 (29) mmHg, PCWP mean 22 mmHg PA sat 57%, ao sat 95% Fick 3.5/1.9 TD 3.0/1.7 3/14 Milrinone stopped.   Having some shortness breath after walking. Says he was short of breath with exertion prior to admit.    Objective:   Weight Range: 64.2 kg Body mass index is 21.52 kg/m.   Vital Signs:   Temp:  [97.7 F (36.5 C)-98.6 F (37 C)] 98.1 F (36.7 C) (03/16 0655) Pulse Rate:  [35-89] 76 (03/16 0400) Resp:  [15-36] 20 (03/16 0400) BP: (96-157)/(56-105) 96/60 (03/16 0400) SpO2:  [83 %-100 %] 98 % (03/16 0400) Weight:  [64.2 kg] 64.2 kg (03/16 0600) Last BM Date: 09/04/20  Weight change: Filed Weights   09/03/20 0458 09/04/20 0500 09/05/20 0600  Weight: 63.3 kg 64 kg 64.2 kg    Intake/Output:   Intake/Output Summary (Last 24 hours) at 09/05/2020 0805 Last data filed at 09/05/2020 0700 Gross per 24 hour  Intake 600 ml  Output 3050 ml  Net -2450 ml      Physical Exam    General:  Sitting in the chair.  No resp difficulty HEENT: normal Neck: supple. no JVD. Carotids 2+ bilat; no bruits. No lymphadenopathy or thryomegaly appreciated. Cor: PMI nondisplaced. Regular rate & rhythm. No rubs, gallops or murmurs. Lungs: Crackles LLL on room air.  Abdomen: soft, nontender, nondistended. No hepatosplenomegaly. No bruits or masses. Good bowel sounds. Extremities: no cyanosis, clubbing, rash, edema Neuro: alert & orientedx3, cranial nerves grossly intact. moves all 4 extremities w/o difficulty. Affect pleasant   Telemetry  NSR 70-80s  Personally reviewed.   EKG    N/A   Labs    CBC Recent Labs    09/04/20 0030 09/05/20 0048  WBC 11.6* 11.0*  HGB 12.2* 12.6*  HCT 35.7* 35.8*  MCV 92.7 91.3  PLT 194  176   Basic Metabolic Panel Recent Labs    09/04/20 0030 09/05/20 0048  NA 129* 128*  K 3.9 4.2  CL 98 97*  CO2 21* 19*  GLUCOSE 165* 152*  BUN 21 18  CREATININE 1.45* 1.36*  CALCIUM 8.5* 8.9   Liver Function Tests No results for input(s): AST, ALT, ALKPHOS, BILITOT, PROT, ALBUMIN in the last 72 hours. No results for input(s): LIPASE, AMYLASE in the last 72 hours. Cardiac Enzymes No results for input(s): CKTOTAL, CKMB, CKMBINDEX, TROPONINI in the last 72 hours.  BNP: BNP (last 3 results) No results for input(s): BNP in the last 8760 hours.  ProBNP (last 3 results) No results for input(s): PROBNP in the last 8760 hours.   D-Dimer No results for input(s): DDIMER in the last 72 hours. Hemoglobin A1C Recent Labs    09/02/20 0936  HGBA1C 5.9*   Fasting Lipid Panel No results for input(s): CHOL, HDL, LDLCALC, TRIG, CHOLHDL, LDLDIRECT in the last 72 hours. Thyroid Function Tests No results for input(s): TSH, T4TOTAL, T3FREE, THYROIDAB in the last 72 hours.  Invalid input(s): FREET3  Other results:   Imaging    No results found.   Medications:     Scheduled Medications: . aspirin EC  81 mg Oral Daily  . Chlorhexidine Gluconate Cloth  6 each Topical Daily  . Chlorhexidine Gluconate  Cloth  6 each Topical Daily  . clopidogrel  75 mg Oral Q breakfast  . empagliflozin  10 mg Oral Daily  . feeding supplement  237 mL Oral BID BM  . gabapentin  600 mg Oral BID  . levothyroxine  50 mcg Oral Q0600  . multivitamin with minerals  1 tablet Oral Daily  . nicotine  21 mg Transdermal Daily  . pantoprazole  40 mg Oral Daily  . ranolazine  1,000 mg Oral BID  . rosuvastatin  40 mg Oral Daily  . sodium chloride flush  10-40 mL Intracatheter Q12H  . sodium chloride flush  3 mL Intravenous Q12H  . spironolactone  12.5 mg Oral Daily    Infusions: . sodium chloride 10 mL/hr at 09/01/20 2024  . sodium chloride    . sodium chloride 10 mL/hr at 09/01/20 2025  . sodium  chloride Stopped (09/03/20 1432)  . sodium chloride 10 mL/hr at 09/01/20 2025    PRN Medications: sodium chloride, acetaminophen, nitroGLYCERIN, ondansetron (ZOFRAN) IV, sodium chloride flush, sodium chloride flush    Patient Profile   Jason [name] is a 70 y/o malewithPAD, HTN, DM2, CAD s/p CABG in 2011 and PCI to SVG in 2018. Admitted 3/12 with NSTEMI. hstrop 11k.   Cath with severe 3V CAD   Assessment/Plan   1. CAD/NSTEMI - s/p CABG 2011 - hstrop 11.5K - cath 3/12 results as above. Culprit lesion flush occlusion of SVG-> OM1-> OM2 (not PCI target) - Medical management. No chest pain.  - DAPT/statin. Continue crestor to 40 mg daily  - Continue ranexa 1000 mg twice a day.  - Add bisoprolol 2.5 mg at bed time  - 2. Acute systolic HF due to iCM/NSTEMI -> cardiogenic shock - EF 25-35% by V-gram  - Echo 3/13 EF 35-40% (read formally as 25-30%) - Volume status stable.  - Continue spiro 12.5 mg daily  - Continue jardiance 10 mg daily  - Start bisoprolol 2.5 mg at bed time  3. DM2 - Started jardiance 3/15   4. AKI - ? ATN vs contrast nepropathy - Creatinine baseline 1.1 -1.2 - Creatinine stable today at 1.36   - Avoid nephrotoxic agents  5. Tobacco use - on nicotine patch. -Discussed smoking cessation.   6. Hypokalemia - Resolved.   7. Hyponatremia - Sodium 128.   - Continue to restrict free water  8. Severe Protein Malnutrition R/T chronic illness Dietitian consulted. Recommendations appreciated.   Jardiance is 47.00/month. He will be give 30 day free card at d/c and will get meds from [facility] at d/c   -Cardiac rehab following.  -Lives alone and able to drive to appointments.   Length of Stay: 4  Amy Clegg, NP  09/05/2020, 8:05 AM  Advanced Heart Failure Team Pager [phone] (M-F; 7a - 5p)  Please contact [facility] for night-coverage after hours (4p -7a ) and weekends on [email or web address]   Patient seen and examined with the above-signed  Advanced Practice Provider and/or Housestaff. I personally reviewed laboratory data, imaging studies and relevant notes. I independently examined the patient and formulated the important aspects of the plan. I have edited the note to reflect any of my changes or salient points. I have personally discussed the plan with the patient and/or family.  Feeling ok. Some SOB with walking. No CP. No orthopnea or PND>  General:  Sitting in chair No resp difficulty HEENT: normal Neck: supple. no JVD. Carotids 2+ bilat; no bruits. No lymphadenopathy or thryomegaly appreciated. Cor: PMI nondisplaced. Regular rate &  rhythm. No rubs, gallops or murmurs. Lungs: clear Abdomen: soft, nontender, nondistended. No hepatosplenomegaly. No bruits or masses. Good bowel sounds. Extremities: no cyanosis, clubbing, rash, edema Neuro: alert & orientedx3, cranial nerves grossly intact. moves all 4 extremities w/o difficulty. Affect pleasant  Feels ok. Remains a bit weak but able to walk hall with help. PT has seen. No recs. Add b-block.   Go to SDU. Likely home in am.   Glori Bickers, MD  2:32 PM

## 2020-09-05 NOTE — Progress Notes (Signed)
CARDIAC REHAB PHASE I   PRE:  Rate/Rhythm: 80 SR  BP:  Supine:   Sitting: 119/78  Standing:    SaO2: 92%RA  MODE:  Ambulation: 660 ft   POST:  Rate/Rhythm: 96 SR  BP:  Supine:   Sitting: 160/82  Standing:    SaO2: 99%RA 1311-1340 Pt willing to walk and he increased distance. Walked 660 ft on RA with rolling walker and little assistance. He stated he needs to do stairs with PT. Able to talk and walk.  Sats hard to get even on ears. Able to get sats after walk and pt sitting. Pt stated he feels less SOB today with activity. No questions re ed done yesterday.   Graylon Good, RN BSN  09/05/2020 1:36 PM

## 2020-09-05 NOTE — Plan of Care (Signed)
  Problem: Clinical Measurements: Goal: Ability to maintain clinical measurements within normal limits will improve Outcome: Progressing Goal: Will remain free from infection Outcome: Progressing Goal: Diagnostic test results will improve Outcome: Progressing Goal: Respiratory complications will improve Outcome: Progressing Goal: Cardiovascular complication will be avoided Outcome: Progressing   Problem: Activity: Goal: Risk for activity intolerance will decrease Outcome: Progressing   Problem: Coping: Goal: Level of anxiety will decrease Outcome: Progressing   Problem: Elimination: Goal: Will not experience complications related to bowel motility Outcome: Progressing Goal: Will not experience complications related to urinary retention Outcome: Progressing   Problem: Pain Managment: Goal: General experience of comfort will improve Outcome: Progressing   Problem: Safety: Goal: Ability to remain free from injury will improve Outcome: Progressing   

## 2020-09-06 LAB — BASIC METABOLIC PANEL
Anion gap: 10 (ref 5–15)
BUN: 22 mg/dL (ref 8–23)
CO2: 24 mmol/L (ref 22–32)
Calcium: 9.2 mg/dL (ref 8.9–10.3)
Chloride: 94 mmol/L — ABNORMAL LOW (ref 98–111)
Creatinine, Ser: 1.83 mg/dL — ABNORMAL HIGH (ref 0.61–1.24)
GFR, Estimated: 40 mL/min — ABNORMAL LOW (ref >60)
Glucose, Bld: 132 mg/dL — ABNORMAL HIGH (ref 70–99)
Potassium: 4.4 mmol/L (ref 3.5–5.1)
Sodium: 128 mmol/L — ABNORMAL LOW (ref 135–145)

## 2020-09-06 LAB — CBC
HCT: 34.2 % — ABNORMAL LOW (ref 39.0–52.0)
Hemoglobin: 12 g/dL — ABNORMAL LOW (ref 13.0–17.0)
MCH: 32.4 pg (ref 26.0–34.0)
MCHC: 35.1 g/dL (ref 30.0–36.0)
MCV: 92.4 fL (ref 80.0–100.0)
Platelets: 253 10*3/uL (ref 150–400)
RBC: 3.7 MIL/uL — ABNORMAL LOW (ref 4.22–5.81)
RDW: 16.2 % — ABNORMAL HIGH (ref 11.5–15.5)
WBC: 12.5 10*3/uL — ABNORMAL HIGH (ref 4.0–10.5)
nRBC: 0 % (ref 0.0–0.2)

## 2020-09-06 LAB — GLUCOSE, CAPILLARY: Glucose-Capillary: 138 mg/dL — ABNORMAL HIGH (ref 70–99)

## 2020-09-06 MED ORDER — GABAPENTIN 300 MG PO CAPS: 600.0000 mg | ORAL_CAPSULE | Freq: Two times a day (BID) | ORAL | 6 refills | Status: DC

## 2020-09-06 MED ORDER — EMPAGLIFLOZIN 10 MG PO TABS: 10.0000 mg | ORAL_TABLET | Freq: Every day | ORAL | 6 refills | Status: DC

## 2020-09-06 MED ORDER — ROSUVASTATIN CALCIUM 40 MG PO TABS: 40.0000 mg | ORAL_TABLET | Freq: Every day | ORAL | 6 refills | Status: DC

## 2020-09-06 MED ORDER — BISOPROLOL FUMARATE 5 MG PO TABS: 2.5000 mg | ORAL_TABLET | Freq: Every day | ORAL | 6 refills | Status: DC

## 2020-09-06 MED ORDER — ASPIRIN 81 MG PO TBEC: 81.0000 mg | DELAYED_RELEASE_TABLET | Freq: Every day | ORAL | 11 refills | Status: DC

## 2020-09-06 MED ORDER — SPIRONOLACTONE 25 MG PO TABS: 12.5000 mg | ORAL_TABLET | Freq: Every day | ORAL | 6 refills | Status: DC

## 2020-09-06 MED ORDER — METFORMIN HCL 500 MG PO TABS: 500.0000 mg | ORAL_TABLET | Freq: Two times a day (BID) | ORAL | 11 refills | Status: DC

## 2020-09-06 NOTE — TOC Transition Note (Signed)
Transition of Care [facility]) - CM/SW Discharge Note Heart Failure   Patient Details  Name: Jason [name] MRN: [id number] Date of Birth: 10-Nov-1950  Transition of Care [facility]) CM/SW Contact:  [place], [place] Phone Number: 09/06/2020, 11:18 AM   Clinical Narrative:    CSW scheduled [name] a primary care appointment with [facility] in [facility], [place] with Dr. Iona Beard on Monday 09/10/20 at 2:15pm. CSW tried to schedule [name] a primary care appointment with [facility] in [place], [place] but they didn't have anything available until May 11/02/2020. [name] reported he would like to keep both appointments for right now since [facility] is connected to [facility]. CSW provided [name] with both [facility] and [facility]'s phone number, address, and appointment information.          Patient Goals and CMS Choice        Discharge Placement                       Discharge Plan and Services                                     Social Determinants of Health (SDOH) Interventions Food Insecurity Interventions: Intervention Not Indicated Financial Strain Interventions: Other (Comment) (CSW scheduled Pt. PCP appointment and encouraged Pt. to attend HV outpatient appointment to discuss further options with outpatient CSW.) Transportation Interventions: Other (Comment) (Pt. reports he shouldn't be driving due to no car insurance but his brother can help him get to appointments if needed)   Readmission Risk Interventions No flowsheet data found.   [name], MSW, [place] Heart Failure Social Worker

## 2020-09-06 NOTE — Discharge Summary (Addendum)
Advanced Heart Failure Team  Discharge Summary   Patient ID: Jason [name] MRN: [id number], DOB/AGE: 1951-06-13 70 y.o. Admit date: 09/01/2020 D/C date:     09/06/2020   Primary Discharge Diagnoses:  1. CAD/NSTEMI 2. Acute systolic HF due to iCM/NSTEMI -> cardiogenic shock 3. DM2 4. AKI 5. Tobacco use 6. Hypokalemia 7. Hyponatremia  Hospital Course:  Mr. Boissonneault is a 70 y/o male with PAD, HTN, DM2, tpbacco abuse, CAD s/p CABG in 2011 and PCI to SVG in 2018.   Admitted 3/12 with NSTEMI. hstrop 11k.Taken to cath lab on 3/12. Cath showed severe 3v native CAD and unable to open SVG and was not PCI target. Plans for medical management.  Post cath started on IV lasix and milrinone. Milrinone gradually weaned off. Placed on GDMT. Cardiac rehab consulted. Plan to repeat ECHO in 3 months.   See below for detailed problem list. He will continue to be followed closely in the HF clinic and eventually back to Dr Claiborne Billings. He was set up with PCP. He received new meds from [place].     1. CAD/NSTEMI - s/p CABG 2011 - hstrop 11.5K - cath 3/12 results as above. Culprit lesion flush occlusion of SVG-> OM1-> OM2 (not PCI target) - Medical management. No further chest pain.  - DAPT/statin. Placed on higher intensity dose of crestor to 40 mg daily  - Continue ranexa 1000 mg twice a day.  -  2. Acute systolic HF due to iCM/NSTEMI -> cardiogenic shock - EF 25-35% by V-gram  - Echo 3/13 EF 35-40% (read formally as 25-30%) - Volume status stable.  - GDMT initiated - Start 12.5 mg daily.    - Started on bisoprolol with COPD.  - Started on jardiance - Hold off on arni with elevated creatinine. Consider as an outpatient.    3. DM2 - started SGLT2i - As well as metformin.    4. AKI - ? ATN vs contrast nepropathy - Creatinine baseline 1.1 -1.2 -  Creatinine peaked at 1.8. Plan to check BMET next week .   5. Tobacco use - on nicotine patch. -Discussed smoking cessation.    6.  Hypokalemia - Resolved.    7. Hyponatremia - Continue to restrict free water  8. Severe Protein Malnutrition  Dietitian consulted.   Discharge Vitals: Blood pressure 105/79, pulse 72, temperature 97.7 F (36.5 C), temperature source Oral, resp. rate (!) 26, height 5\' 8"  (1.727 m), weight 62.5 kg, SpO2 99 %.  Labs: Lab Results  Component Value Date   WBC 12.5 (H) 09/06/2020   HGB 12.0 (L) 09/06/2020   HCT 34.2 (L) 09/06/2020   MCV 92.4 09/06/2020   PLT 253 09/06/2020    Recent Labs  Lab 09/06/20 0043  NA 128*  K 4.4  CL 94*  CO2 24  BUN 22  CREATININE 1.83*  CALCIUM 9.2  GLUCOSE 132*   Lab Results  Component Value Date   CHOL 159 09/01/2020   HDL 44 09/01/2020   LDLCALC 86 09/01/2020   TRIG 146 09/01/2020   BNP (last 3 results) No results for input(s): BNP in the last 8760 hours.  ProBNP (last 3 results) No results for input(s): PROBNP in the last 8760 hours.   Diagnostic Studies/Procedures  LHC 3/12  Severe 3v native CAD - LIMA ok - SVG-> PDA 100% (chronic) - SVG -> OM-1 -> OM-2 100% flush occlusion (culpit)  - not PCI target - EF 25-35% LVEDP 35 - PA 47/19 (29) mmHg, PCWP  mean 22 mmHg PA sat 57%, ao sat 95% Fick 3.5/1.9 TD 3.0/1.7  ECHO 09/02/2020  1. Posterolateral and anterolateral akinesis. Akinesis of the basal to mid anterior myocardium with hypokinesis of the apical anterior myocardium. Hypokinesis of the basal inferior myocardium.. Left ventricular ejection fraction, by estimation, is 25 to 30%. The left ventricle has severely decreased function. The left ventricle demonstrates regional wall motion abnormalities (see scoring diagram/findings for description). There is mild concentric left ventricular hypertrophy. Left ventricular diastolic parameters are consistent with Grade I diastolic dysfunction (impaired relaxation). 2. Right ventricular systolic function is normal. The right ventricular size is normal. There is normal pulmonary artery  systolic pressure. 3. Left atrial size was mildly dilated. 4. The mitral valve is normal in structure. Trivial mitral valve regurgitation. No evidence of mitral stenosis. 5. The aortic valve is calcified. There is moderate calcification of the aortic valve. There is moderate thickening of the aortic valve. Aortic valve regurgitation is not visualized. No aortic stenosis is present. 6. The inferior vena cava is normal in size with greater than 50% respiratory variability, suggesting right atrial pressure of 3 mmHg. Discharge Medications   Allergies as of 09/06/2020       Reactions   Coreg [carvedilol] Nausea And Vomiting, Other (See Comments)   Per patient made him dizzy and light sensitive   Sulfa Antibiotics Nausea And Vomiting, Other (See Comments)   Also headaches        Medication List     STOP taking these medications    aspirin 325 MG tablet Replaced by: aspirin 81 MG EC tablet   Janumet 50-500 MG tablet Generic drug: sitaGLIPtin-metformin   metoCLOPramide 10 MG tablet Commonly known as: REGLAN   metoprolol tartrate 25 MG tablet Commonly known as: LOPRESSOR   Zantac 360 10 MG tablet Generic drug: famotidine       TAKE these medications    acetaminophen 650 MG CR tablet Commonly known as: TYLENOL Take 1,950-2,600 mg by mouth 2 (two) times daily as needed for pain.   aspirin 81 MG EC tablet Take 1 tablet (81 mg total) by mouth daily. Swallow whole. Replaces: aspirin 325 MG tablet   bisoprolol 5 MG tablet Commonly known as: ZEBETA Take 0.5 tablets (2.5 mg total) by mouth at bedtime.   clopidogrel 75 MG tablet Commonly known as: PLAVIX TAKE 1 TABLET BY MOUTH EVERY DAY   diphenhydrAMINE 25 MG tablet Commonly known as: BENADRYL Take 75 mg by mouth at bedtime as needed for sleep.   docusate sodium 100 MG capsule Commonly known as: COLACE Take 100 mg by mouth 2 (two) times daily.   empagliflozin 10 MG Tabs tablet Commonly known as: JARDIANCE Take  1 tablet (10 mg total) by mouth daily.   ferrous sulfate 325 (65 FE) MG tablet Take 1 tablet (325 mg total) by mouth daily with breakfast.   gabapentin 300 MG capsule Commonly known as: NEURONTIN Take 2 capsules (600 mg total) by mouth 2 (two) times daily. What changed: how much to take   ibuprofen 200 MG tablet Commonly known as: ADVIL Take 800 mg by mouth 2 (two) times daily as needed for mild pain.   isosorbide mononitrate 120 MG 24 hr tablet Commonly known as: IMDUR Take 1 tablet (120 mg total) by mouth daily.   levothyroxine 50 MCG tablet Commonly known as: SYNTHROID Take 1 tablet (50 mcg total) by mouth daily.   loratadine 10 MG tablet Commonly known as: CLARITIN Take 10 mg by mouth daily.  metFORMIN 500 MG tablet Commonly known as: Glucophage Take 1 tablet (500 mg total) by mouth 2 (two) times daily with a meal.   nicotine 21 mg/24hr patch Commonly known as: NICODERM CQ - dosed in mg/24 hours Place 1 patch (21 mg total) onto the skin daily.   nitroGLYCERIN 0.4 MG SL tablet Commonly known as: NITROSTAT Place 1 tablet (0.4 mg total) under the tongue every 5 (five) minutes as needed for chest pain.   pantoprazole 40 MG tablet Commonly known as: PROTONIX Take 1 tablet (40 mg total) by mouth 2 (two) times daily before a meal.   ranolazine 1000 MG SR tablet Commonly known as: RANEXA TAKE 1 TABLET BY MOUTH 2 TIMES DAILY. What changed:  how much to take how to take this when to take this additional instructions   rosuvastatin 40 MG tablet Commonly known as: CRESTOR Take 1 tablet (40 mg total) by mouth daily. What changed:  medication strength how much to take   spironolactone 25 MG tablet Commonly known as: ALDACTONE Take 0.5 tablets (12.5 mg total) by mouth daily.        Disposition   The patient will be discharged in stable condition to home. Discharge Instructions     (HEART FAILURE PATIENTS) Call MD:  Anytime you have any of the following  symptoms: 1) 3 pound weight gain in 24 hours or 5 pounds in 1 week 2) shortness of breath, with or without a dry hacking cough 3) swelling in the hands, feet or stomach 4) if you have to sleep on extra pillows at night in order to breathe.   Complete by: As directed    Amb Referral to Cardiac Rehabilitation   Complete by: As directed    Referring to [place] CRP 2   Diagnosis: NSTEMI   After initial evaluation and assessments completed: Virtual Based Care may be provided alone or in conjunction with Phase 2 Cardiac Rehab based on patient barriers.: Yes   Diet - low sodium heart healthy   Complete by: As directed    Heart Failure patients record your daily weight using the same scale at the same time of day   Complete by: As directed    Increase activity slowly   Complete by: As directed        Follow-up Information     [facility] Follow up on 09/24/2020.   Specialty: Cardiology Why: Also needs lab work next week to check renal function. 08/16/20 at 2:30   at 0830. Garage Code [id number]. Located at [facility] on the 1st floor. Entrance C Contact information: [address] [id number] [place] [phone]                  Duration of Discharge Encounter: Greater than 35 minutes   Signed, Darrick Grinder NP-C  09/06/2020, 12:05 PM  Patient seen and examined with the above-signed Advanced Practice Provider and/or Housestaff. I personally reviewed laboratory data, imaging studies and relevant notes. I independently examined the patient and formulated the important aspects of the plan. I have edited the note to reflect any of my changes or salient points. I have personally discussed the plan with the patient and/or family.  He is stable for d/c. Will need close outpatient f/u.   Glori Bickers, MD  11:40 AM

## 2020-09-06 NOTE — Progress Notes (Addendum)
Advanced Heart Failure Rounding Note  PCP-Cardiologist: Shelva Majestic, MD   Subjective:   Cath 3/12  Severe 3v native CAD - LIMA ok - SVG-> PDA 100% (chronic) - SVG -> OM-1 -> OM-2 100% flush occlusion (culpit)  - not PCI target - EF 25-35% LVEDP 35 - PA 47/19 (29) mmHg, PCWP mean 22 mmHg PA sat 57%, ao sat 95% Fick 3.5/1.9 TD 3.0/1.7 3/14 Milrinone stopped.   Yesterday bisoprolol added.   Denies chest pain. Denies shortness of breath.    Objective:   Weight Range: 62.5 kg Body mass index is 20.95 kg/m.   Vital Signs:   Temp:  [97.8 F (36.6 C)-98.5 F (36.9 C)] 98.5 F (36.9 C) (03/16 2000) Pulse Rate:  [67-123] 79 (03/17 0700) Resp:  [14-26] 21 (03/17 0700) BP: (98-143)/(57-100) 109/88 (03/17 0700) SpO2:  [83 %-100 %] 99 % (03/17 0700) Weight:  [62.5 kg] 62.5 kg (03/17 0500) Last BM Date: 09/04/20  Weight change: Filed Weights   09/04/20 0500 09/05/20 0600 09/06/20 0500  Weight: 64 kg 64.2 kg 62.5 kg    Intake/Output:   Intake/Output Summary (Last 24 hours) at 09/06/2020 0806 Last data filed at 09/06/2020 0400 Gross per 24 hour  Intake 420 ml  Output 4925 ml  Net -4505 ml      Physical Exam    General:  Sitting in the chair.  No resp difficulty HEENT: normal Neck: supple. no JVD. Carotids 2+ bilat; no bruits. No lymphadenopathy or thryomegaly appreciated. Cor: PMI nondisplaced. Regular rate & rhythm. No rubs, gallops or murmurs. Lungs: clear Abdomen: soft, nontender, nondistended. No hepatosplenomegaly. No bruits or masses. Good bowel sounds. Extremities: no cyanosis, clubbing, rash, edema Neuro: alert & orientedx3, cranial nerves grossly intact. moves all 4 extremities w/o difficulty. Affect pleasant   Telemetry  NSR 70-80s personally reviewed.   EKG    N/A   Labs    CBC Recent Labs    09/05/20 0048 09/06/20 0043  WBC 11.0* 12.5*  HGB 12.6* 12.0*  HCT 35.8* 34.2*  MCV 91.3 92.4  PLT 223 536   Basic Metabolic Panel Recent  Labs    09/05/20 0048 09/06/20 0043  NA 128* 128*  K 4.2 4.4  CL 97* 94*  CO2 19* 24  GLUCOSE 152* 132*  BUN 18 22  CREATININE 1.36* 1.83*  CALCIUM 8.9 9.2   Liver Function Tests No results for input(s): AST, ALT, ALKPHOS, BILITOT, PROT, ALBUMIN in the last 72 hours. No results for input(s): LIPASE, AMYLASE in the last 72 hours. Cardiac Enzymes No results for input(s): CKTOTAL, CKMB, CKMBINDEX, TROPONINI in the last 72 hours.  BNP: BNP (last 3 results) No results for input(s): BNP in the last 8760 hours.  ProBNP (last 3 results) No results for input(s): PROBNP in the last 8760 hours.   D-Dimer No results for input(s): DDIMER in the last 72 hours. Hemoglobin A1C No results for input(s): HGBA1C in the last 72 hours. Fasting Lipid Panel No results for input(s): CHOL, HDL, LDLCALC, TRIG, CHOLHDL, LDLDIRECT in the last 72 hours. Thyroid Function Tests No results for input(s): TSH, T4TOTAL, T3FREE, THYROIDAB in the last 72 hours.  Invalid input(s): FREET3  Other results:   Imaging    No results found.   Medications:     Scheduled Medications: . aspirin EC  81 mg Oral Daily  . bisoprolol  2.5 mg Oral QHS  . Chlorhexidine Gluconate Cloth  6 each Topical Daily  . Chlorhexidine Gluconate Cloth  6 each Topical  Daily  . clopidogrel  75 mg Oral Q breakfast  . empagliflozin  10 mg Oral Daily  . feeding supplement  237 mL Oral BID BM  . gabapentin  600 mg Oral BID  . levothyroxine  50 mcg Oral Q0600  . multivitamin with minerals  1 tablet Oral Daily  . nicotine  21 mg Transdermal Daily  . pantoprazole  40 mg Oral Daily  . ranolazine  1,000 mg Oral BID  . rosuvastatin  40 mg Oral Daily  . sodium chloride flush  10-40 mL Intracatheter Q12H  . sodium chloride flush  3 mL Intravenous Q12H  . spironolactone  12.5 mg Oral Daily    Infusions: . sodium chloride 10 mL/hr at 09/01/20 2024  . sodium chloride    . sodium chloride 10 mL/hr at 09/01/20 2025  . sodium  chloride Stopped (09/03/20 1432)  . sodium chloride 10 mL/hr at 09/01/20 2025    PRN Medications: sodium chloride, acetaminophen, nitroGLYCERIN, ondansetron (ZOFRAN) IV, sodium chloride flush, sodium chloride flush    Patient Profile   Mr.Thrun is a 70 y/o malewithPAD, HTN, DM2, CAD s/p CABG in 2011 and PCI to SVG in 2018. Admitted 3/12 with NSTEMI. hstrop 11k.   Cath with severe 3V CAD   Assessment/Plan   1. CAD/NSTEMI - s/p CABG 2011 - hstrop 11.5K - cath 3/12 results as above. Culprit lesion flush occlusion of SVG-> OM1-> OM2 (not PCI target) - Medical management. No chest pain.  - DAPT/statin. Continue crestor to 40 mg daily  - Continue ranexa 1000 mg twice a day.  - Continue bisoprolol 2.5 mg at bed time   2. Acute systolic HF due to iCM/NSTEMI -> cardiogenic shock - EF 25-35% by V-gram  - Echo 3/13 EF 35-40% (read formally as 25-30%) - Volume status stable.  - Continue spiro 12.5 mg daily  - Continue jardiance 10 mg daily  - Continue  bisoprolol 2.5 mg at bed time - Hold off on Arb with elevated creatinne.   3. DM2 - Started jardiance 3/15   4. AKI - ? ATN vs contrast nepropathy - Creatinine baseline 1.1 -1.2 - Creatinine stable trending up 1.4>1.8 . Jardiance started so this may be the result SGT2i    - Avoid nephrotoxic agents  5. Tobacco use - on nicotine patch. -Discussed smoking cessation.   6. Hypokalemia - K 4.4  7. Hyponatremia - Sodium 128.   - Continue to restrict free water  8. Severe Protein Malnutrition R/T chronic illness Dietitian consulted. Recommendations appreciated.   Jardiance is 47.00/month. He will be give 30 day free card at d/c and will get meds from [facility] at d/c   D/C today will get meds from [facility]. Have asked pharmacy to educate on HF meds.   Length of Stay: [place], NP  09/06/2020, 8:06 AM  Advanced Heart Failure Team Pager [phone] (M-F; 7a - 5p)  Please contact [facility] for night-coverage after  hours (4p -7a ) and weekends on [email or web address]  Patient seen and examined with the above-signed Advanced Practice Provider and/or Housestaff. I personally reviewed laboratory data, imaging studies and relevant notes. I independently examined the patient and formulated the important aspects of the plan. I have edited the note to reflect any of my changes or salient points. I have personally discussed the plan with the patient and/or family.  Feels better. No CP or SOB. Did well with PT yesterday.   General:  Sitting up in chair. No resp difficulty HEENT: normal  Neck: supple. no JVD. Carotids 2+ bilat; no bruits. No lymphadenopathy or thryomegaly appreciated. Cor: PMI nondisplaced. Regular rate & rhythm. No rubs, gallops or murmurs. Lungs: clear Abdomen: soft, nontender, nondistended. No hepatosplenomegaly. No bruits or masses. Good bowel sounds. Extremities: no cyanosis, clubbing, rash, tr edema Neuro: alert & orientedx3, cranial nerves grossly intact. moves all 4 extremities w/o difficulty. Affect pleasant  Creatinine up slightly. Otherwise looks good. Discussed meds in detail. Will need f/u next week.   Glori Bickers, MD  8:49 AM

## 2020-09-13 ENCOUNTER — Other Ambulatory Visit (HOSPITAL_COMMUNITY): Payer: Medicare Other

## 2020-09-14 ENCOUNTER — Telehealth (HOSPITAL_COMMUNITY): Payer: Self-pay

## 2020-09-14 NOTE — Telephone Encounter (Signed)
Cardiac rehab referral for Ph.II faxed to [facility].

## 2020-09-23 NOTE — Progress Notes (Incomplete)
Advanced Heart Failure Clinic Note   Referring Physician: PCP: Horald Pollen, MD PCP-Cardiologist: Shelva Majestic, MD   HPI: Mr.Tilleyis a 70 y/o malewithPAD, HTN, DM2,tpbacco abuse, CAD s/p CABG in 2011 and PCI to SVG in 2018.   Admitted 3/12 with NSTEMI. hstrop 11k.Taken to cath lab on 3/12. Cath showed severe 3v native CAD and unable to open SVG and was not PCI target. Plans for medical management.  Post cath started on IV lasix and milrinone. Milrinone gradually weaned off. Placed on GDMT. Cardiac rehab consulted. Plan to repeat ECHO in 3 months.   Today he returns for post hospital HF follow up.Overall feeling fine. Denies SOB/PND/Orthopnea. Appetite ok. No fever or chills. Weight at home  pounds. Taking all medications.     Review of Systems: [y] = yes, [ ]  = no   General: Weight gain [ ] ; Weight loss [ ] ; Anorexia [ ] ; Fatigue [ ] ; Fever [ ] ; Chills [ ] ; Weakness [ ]   Cardiac: Chest pain/pressure [ ] ; Resting SOB [ ] ; Exertional SOB [ ] ; Orthopnea [ ] ; Pedal Edema [ ] ; Palpitations [ ] ; Syncope [ ] ; Presyncope [ ] ; Paroxysmal nocturnal dyspnea[ ]   Pulmonary: Cough [ ] ; Wheezing[ ] ; Hemoptysis[ ] ; Sputum [ ] ; Snoring [ ]   GI: Vomiting[ ] ; Dysphagia[ ] ; Melena[ ] ; Hematochezia [ ] ; Heartburn[ ] ; Abdominal pain [ ] ; Constipation [ ] ; Diarrhea [ ] ; BRBPR [ ]   GU: Hematuria[ ] ; Dysuria [ ] ; Nocturia[ ]   Vascular: Pain in legs with walking [ ] ; Pain in feet with lying flat [ ] ; Non-healing sores [ ] ; Stroke [ ] ; TIA [ ] ; Slurred speech [ ] ;  Neuro: Headaches[ ] ; Vertigo[ ] ; Seizures[ ] ; Paresthesias[ ] ;Blurred vision [ ] ; Diplopia [ ] ; Vision changes [ ]   Ortho/Skin: Arthritis [ ] ; Joint pain [ ] ; Muscle pain [ ] ; Joint swelling [ ] ; Back Pain [ ] ; Rash [ ]   Psych: Depression[ ] ; Anxiety[ ]   Heme: Bleeding problems [ ] ; Clotting disorders [ ] ; Anemia [ ]   Endocrine: Diabetes [ ] ; Thyroid dysfunction[ ]    Past Medical History:  Diagnosis Date  . Anxiety    off xanax   and paxil since 3/13  . Arthritis   . Bilateral carotid artery disease ([place])    s/p R ICA stent 03/28/2015 with distal protection. Known chronically occluded L ICA. Carotid stent complicated by hypotension and acute stroke [place]  . Cataract   . COPD (chronic obstructive pulmonary disease) ([place])   . Coronary artery disease    a. s/p CABG in 2011 with LIMA-LAD, SVG-RI/OM, and SVG-PDA b. occluded SVG-PDA by cath in 2015 c. 10/2016: NSTEMI with cath showing thrombus along the distal graft to insertion of SVG-OM2 with DES placed.   . CVA (cerebral infarction)    occured on 10/10 several days after R ICA carotid stenting  . Depression   . Diabetes mellitus   . GERD (gastroesophageal reflux disease)   . GERD with stricture   . High cholesterol   . Hx of adenomatous colonic polyps 05/2020   10 diminutive adenomas  . Hypertension    type 2 NIDDM  . Myocardial infarct (HCC)    x4 last 10 yrs  . Pneumonia    hx  . 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 Skin cancer, basal cell    tumor basal cell rem from lft arm  . Smoker   . Substance abuse ([place])     Current  Outpatient Medications  Medication Sig Dispense Refill  . acetaminophen (TYLENOL) 650 MG CR tablet Take 1,950-2,600 mg by mouth 2 (two) times daily as needed for pain.    Marland Kitchen aspirin EC 81 MG EC tablet Take 1 tablet (81 mg total) by mouth daily. Swallow whole. 30 tablet 11  . bisoprolol (ZEBETA) 5 MG tablet Take 0.5 tablets (2.5 mg total) by mouth at bedtime. 30 tablet 6  . clopidogrel (PLAVIX) 75 MG tablet TAKE 1 TABLET BY MOUTH EVERY DAY (Patient taking differently: Take 75 mg by mouth daily.) 90 tablet 3  . diphenhydrAMINE (BENADRYL) 25 MG tablet Take 75 mg by mouth at bedtime as needed for sleep.    Marland Kitchen docusate sodium (COLACE) 100 MG capsule Take 100 mg by mouth 2 (two) times daily.    . empagliflozin (JARDIANCE) 10 MG TABS tablet Take 1 tablet (10 mg total) by mouth daily. 30 tablet  6  . ferrous sulfate 325 (65 FE) MG tablet Take 1 tablet (325 mg total) by mouth daily with breakfast. 30 tablet 3  . gabapentin (NEURONTIN) 300 MG capsule Take 2 capsules (600 mg total) by mouth 2 (two) times daily. 60 capsule 6  . ibuprofen (ADVIL) 200 MG tablet Take 800 mg by mouth 2 (two) times daily as needed for mild pain.    . isosorbide mononitrate (IMDUR) 120 MG 24 hr tablet Take 1 tablet (120 mg total) by mouth daily. 90 tablet 3  . levothyroxine (SYNTHROID) 50 MCG tablet Take 1 tablet (50 mcg total) by mouth daily. 30 tablet 0  . loratadine (CLARITIN) 10 MG tablet Take 10 mg by mouth daily.    . metFORMIN (GLUCOPHAGE) 500 MG tablet Take 1 tablet (500 mg total) by mouth 2 (two) times daily with a meal. 60 tablet 11  . nicotine (NICODERM CQ - DOSED IN MG/24 HOURS) 21 mg/24hr patch Place 1 patch (21 mg total) onto the skin daily. (Patient not taking: Reported on 09/01/2020) 28 patch 0  . nitroGLYCERIN (NITROSTAT) 0.4 MG SL tablet Place 1 tablet (0.4 mg total) under the tongue every 5 (five) minutes as needed for chest pain. 25 tablet 3  . pantoprazole (PROTONIX) 40 MG tablet Take 1 tablet (40 mg total) by mouth 2 (two) times daily before a meal. (Patient not taking: Reported on 09/01/2020) 60 tablet 0  . ranolazine (RANEXA) 1000 MG SR tablet TAKE 1 TABLET BY MOUTH 2 TIMES DAILY. (Patient taking differently: Take 1,000 mg by mouth 2 (two) times daily.) 180 tablet 1  . rosuvastatin (CRESTOR) 40 MG tablet Take 1 tablet (40 mg total) by mouth daily. 40 tablet 6  . spironolactone (ALDACTONE) 25 MG tablet Take 0.5 tablets (12.5 mg total) by mouth daily. 30 tablet 6   No current facility-administered medications for this visit.    Allergies  Allergen Reactions  . Coreg [Carvedilol] Nausea And Vomiting and Other (See Comments)    Per patient made him dizzy and light sensitive  . Sulfa Antibiotics Nausea And Vomiting and Other (See Comments)    Also headaches       Social History    Socioeconomic History  . Marital status: Single    Spouse name: Not on file  . Number of children: 1  . Years of education: 94  . Highest education level: Not on file  Occupational History  . Occupation: welder-fabricator  Tobacco Use  . Smoking status: Current Every Day Smoker    Packs/day: 1.00    Years: 48.00    Pack  years: 48.00    Types: Cigarettes  . Smokeless tobacco: Never Used  Vaping Use  . Vaping Use: Never used  Substance and Sexual Activity  . Alcohol use: Yes    Alcohol/week: 0.0 standard drinks    Comment: socially  . Drug use: Not Currently  . Sexual activity: Not on file  Other Topics Concern  . Not on file  Social History Narrative   Lives alone   Caffeine use: Drinks pepsi/coffee (32 oz diet pepsi per day)    Exercise walking 4 times per week for 1 mile   Current PPD smoker.    Social Determinants of Health   Financial Resource Strain: Medium Risk  . Difficulty of Paying Living Expenses: Somewhat hard  Food Insecurity: No Food Insecurity  . Worried About Charity fundraiser in the Last Year: Never true  . Ran Out of Food in the Last Year: Never true  Transportation Needs: No Transportation Needs  . Lack of Transportation (Medical): No  . Lack of Transportation (Non-Medical): No  Physical Activity: Not on file  Stress: Not on file  Social Connections: Not on file  Intimate Partner Violence: Not on file      Family History  Problem Relation Age of Onset  . Arthritis Mother   . Heart disease Father     There were no vitals filed for this visit.   PHYSICAL EXAM: General:  Well appearing. No respiratory difficulty HEENT: normal Neck: supple. no JVD. Carotids 2+ bilat; no bruits. No lymphadenopathy or thyromegaly appreciated. Cor: PMI nondisplaced. Regular rate & rhythm. No rubs, gallops or murmurs. Lungs: clear Abdomen: soft, nontender, nondistended. No hepatosplenomegaly. No bruits or masses. Good bowel sounds. Extremities: no  cyanosis, clubbing, rash, edema Neuro: alert & oriented x 3, cranial nerves grossly intact. moves all 4 extremities w/o difficulty. Affect pleasant.  ECG:   ASSESSMENT & PLAN:  1. CAD/NSTEMI - s/p CABG2011 - hstrop 11.5K - cath 3/12 results as above. Culprit lesion flush occlusion of SVG-> OM1-> OM2 (not PCI target) - Medical management. No further chest pain.  - DAPT/statin.Placed on higher intensity dose of crestor to 40mg  daily - Continue ranexa 1000 mg twice a day.  - 2. Chronic Systolic HF due to iCM/NSTEMI  - EF 25-35% by V-gram  - Echo 09/02/20 EF 35-40% (read formally as 25-30%) - -Continue spiro 12.5 mg daily. - Continue bisoprolol  -Continue jardiance 10 mg daily  - Hold off on arni with elevated creatinine.  3. DM2 - started SGLT2i - As well as metformin.   4. H/O AKI - ? ATN vs contrast nepropathy - Creatinine baseline 1.1 -1.2 -Check BMET   5. Tobacco use - on nicotine patch. -Discussed smoking cessation.  Check CBC, BMET, EKG  Darrick Grinder, NP 09/23/20

## 2020-09-24 ENCOUNTER — Encounter (HOSPITAL_COMMUNITY): Payer: Medicare Other

## 2020-11-02 ENCOUNTER — Encounter: Payer: Self-pay | Admitting: Family

## 2020-11-02 ENCOUNTER — Ambulatory Visit (INDEPENDENT_AMBULATORY_CARE_PROVIDER_SITE_OTHER): Payer: Medicare Other | Admitting: Family

## 2020-11-02 ENCOUNTER — Other Ambulatory Visit: Payer: Self-pay

## 2020-11-02 VITALS — BP 124/60 | HR 72 | Temp 97.5°F | Ht 68.0 in | Wt 158.2 lb

## 2020-11-02 DIAGNOSIS — G8929 Other chronic pain: Secondary | ICD-10-CM

## 2020-11-02 DIAGNOSIS — M545 Low back pain, unspecified: Secondary | ICD-10-CM | POA: Diagnosis not present

## 2020-11-02 DIAGNOSIS — I214 Non-ST elevation (NSTEMI) myocardial infarction: Secondary | ICD-10-CM

## 2020-11-02 DIAGNOSIS — E08621 Diabetes mellitus due to underlying condition with foot ulcer: Secondary | ICD-10-CM

## 2020-11-02 DIAGNOSIS — E039 Hypothyroidism, unspecified: Secondary | ICD-10-CM | POA: Diagnosis not present

## 2020-11-02 DIAGNOSIS — L97509 Non-pressure chronic ulcer of other part of unspecified foot with unspecified severity: Secondary | ICD-10-CM | POA: Diagnosis not present

## 2020-11-02 DIAGNOSIS — I10 Essential (primary) hypertension: Secondary | ICD-10-CM

## 2020-11-02 DIAGNOSIS — F339 Major depressive disorder, recurrent, unspecified: Secondary | ICD-10-CM

## 2020-11-02 DIAGNOSIS — J449 Chronic obstructive pulmonary disease, unspecified: Secondary | ICD-10-CM

## 2020-11-02 LAB — CBC WITH DIFFERENTIAL/PLATELET
Basophils Absolute: 0 10*3/uL (ref 0.0–0.1)
Basophils Relative: 0.5 % (ref 0.0–3.0)
Eosinophils Absolute: 0.2 10*3/uL (ref 0.0–0.7)
Eosinophils Relative: 2.7 % (ref 0.0–5.0)
HCT: 37.2 % — ABNORMAL LOW (ref 39.0–52.0)
Hemoglobin: 12.6 g/dL — ABNORMAL LOW (ref 13.0–17.0)
Lymphocytes Relative: 27.9 % (ref 12.0–46.0)
Lymphs Abs: 2.3 10*3/uL (ref 0.7–4.0)
MCHC: 33.8 g/dL (ref 30.0–36.0)
MCV: 99.5 fl (ref 78.0–100.0)
Monocytes Absolute: 0.6 10*3/uL (ref 0.1–1.0)
Monocytes Relative: 7.7 % (ref 3.0–12.0)
Neutro Abs: 5.1 10*3/uL (ref 1.4–7.7)
Neutrophils Relative %: 61.2 % (ref 43.0–77.0)
Platelets: 223 10*3/uL (ref 150.0–400.0)
RBC: 3.74 Mil/uL — ABNORMAL LOW (ref 4.22–5.81)
RDW: 15 % (ref 11.5–15.5)
WBC: 8.4 10*3/uL (ref 4.0–10.5)

## 2020-11-02 LAB — COMPREHENSIVE METABOLIC PANEL
ALT: 9 U/L (ref 0–53)
AST: 11 U/L (ref 0–37)
Albumin: 4.4 g/dL (ref 3.5–5.2)
Alkaline Phosphatase: 83 U/L (ref 39–117)
BUN: 15 mg/dL (ref 6–23)
CO2: 23 mEq/L (ref 19–32)
Calcium: 9.8 mg/dL (ref 8.4–10.5)
Chloride: 103 mEq/L (ref 96–112)
Creatinine, Ser: 1.47 mg/dL (ref 0.40–1.50)
GFR: 48.81 mL/min — ABNORMAL LOW (ref >60.00)
Glucose, Bld: 105 mg/dL — ABNORMAL HIGH (ref 70–99)
Potassium: 5.8 mEq/L — ABNORMAL HIGH (ref 3.5–5.1)
Sodium: 135 mEq/L (ref 135–145)
Total Bilirubin: 0.3 mg/dL (ref 0.2–1.2)
Total Protein: 7.1 g/dL (ref 6.0–8.3)

## 2020-11-02 LAB — MICROALBUMIN / CREATININE URINE RATIO
Creatinine,U: 53.1 mg/dL
Microalb Creat Ratio: 1.3 mg/g (ref 0.0–30.0)
Microalb, Ur: <0.7 mg/dL (ref 0.0–1.9)

## 2020-11-02 LAB — TSH: TSH: 3.37 u[IU]/mL (ref 0.35–4.50)

## 2020-11-02 LAB — HEMOGLOBIN A1C: Hgb A1c MFr Bld: 5.9 % (ref 4.6–6.5)

## 2020-11-02 MED ORDER — ESCITALOPRAM OXALATE 10 MG PO TABS: 10.0000 mg | ORAL_TABLET | Freq: Every day | ORAL | 0 refills | Status: DC

## 2020-11-02 MED ORDER — LEVOTHYROXINE SODIUM 50 MCG PO TABS: 50.0000 ug | ORAL_TABLET | Freq: Every day | ORAL | 0 refills | Status: DC

## 2020-11-02 MED ORDER — ALBUTEROL SULFATE (2.5 MG/3ML) 0.083% IN NEBU: 2.5000 mg | INHALATION_SOLUTION | Freq: Four times a day (QID) | RESPIRATORY_TRACT | 1 refills | Status: DC | PRN

## 2020-11-02 NOTE — Progress Notes (Signed)
KYE HEDDEN is a 70 y.o. male with the following history as recorded in EpicCare:  Patient Active Problem List   Diagnosis Date Noted  . Protein-calorie malnutrition, severe 09/05/2020  . Malnutrition of moderate degree 06/13/2020  . Benign neoplasm of ascending colon   . Benign neoplasm of transverse colon   . Benign neoplasm of sigmoid colon   . Lower esophageal ring   . Dysphagia   . Antiplatelet or antithrombotic long-term use   . Dehydration   . Emesis   . Diarrhea   . AKI (acute kidney injury) ([place]) 06/06/2020  . Hx of adenomatous colonic polyps 05/2020  . Pressure injury of skin 12/04/2019  . Critical lower limb ischemia ([place]) 11/26/2019  . Wound of lower extremity 11/26/2019  . Toe fracture, right 11/26/2019  . Amputation of right great toe ([place]) 07/11/2019  . Nicotine abuse 06/12/2019  . Cellulitis of right foot 06/11/2019  . Diabetic foot infection ([place]) 06/11/2019  . PAD (peripheral artery disease) ([place]) 06/11/2019  . Cellulitis 05/27/2019  . Gangrene of toe ([place]) 05/27/2019  . Status post lumbar spinal fusion 10/27/2017  . Acute on chronic combined systolic and diastolic CHF (congestive heart failure) ([place]) 10/25/2016  . NSTEMI (non-ST elevated myocardial infarction) ([place]) 10/22/2016  . Hypothyroidism 08/17/2015  . RBBB 05/11/2015  . Hyponatremia 04/04/2015  . Hypotension 04/04/2015  . Hemispheric carotid artery syndrome   . Carotid artery narrowing 03/28/2015  . Carotid stenosis- moderate 2011, 95% 2016 s/p stent 02/21/2014  . Unstable angina ([place]) 01/16/2014  . Tobacco abuse 01/16/2014  . Substance abuse in remission ([place]) 10/01/2012  . Radiculitis 02/10/2012  . CAD, CABG Feb 2011, cath x 4 since-medical Rx 10/03/2011  . PVD, hx Rt femoral endarterectomy 2004 10/03/2011  . DM (diabetes mellitus) ([place]) 10/02/2011  . HTN (hypertension) 10/02/2011  . Hyperlipidemia 10/02/2011    Current Outpatient Medications  Medication Sig Dispense Refill  .  acetaminophen (TYLENOL) 650 MG CR tablet Take 1,950-2,600 mg by mouth 2 (two) times daily as needed for pain.    Marland Kitchen albuterol (PROVENTIL) (2.5 MG/3ML) 0.083% nebulizer solution Take 3 mLs (2.5 mg total) by nebulization every 6 (six) hours as needed for wheezing or shortness of breath. 150 mL 1  . aspirin EC 81 MG EC tablet Take 1 tablet (81 mg total) by mouth daily. Swallow whole. 30 tablet 11  . bisoprolol (ZEBETA) 5 MG tablet Take 0.5 tablets (2.5 mg total) by mouth at bedtime. 30 tablet 6  . clopidogrel (PLAVIX) 75 MG tablet TAKE 1 TABLET BY MOUTH EVERY DAY (Patient taking differently: Take 75 mg by mouth daily.) 90 tablet 3  . diphenhydrAMINE (BENADRYL) 25 MG tablet Take 75 mg by mouth at bedtime as needed for sleep.    Marland Kitchen docusate sodium (COLACE) 100 MG capsule Take 100 mg by mouth 2 (two) times daily.    . empagliflozin (JARDIANCE) 10 MG TABS tablet Take 1 tablet (10 mg total) by mouth daily. 30 tablet 6  . escitalopram (LEXAPRO) 10 MG tablet Take 1 tablet (10 mg total) by mouth daily. 90 tablet 0  . ferrous sulfate 325 (65 FE) MG tablet Take 1 tablet (325 mg total) by mouth daily with breakfast. 30 tablet 3  . gabapentin (NEURONTIN) 300 MG capsule Take 2 capsules (600 mg total) by mouth 2 (two) times daily. 60 capsule 6  . ibuprofen (ADVIL) 200 MG tablet Take 800 mg by mouth 2 (two) times daily as needed for mild pain.    . isosorbide  mononitrate (IMDUR) 120 MG 24 hr tablet Take 1 tablet (120 mg total) by mouth daily. 90 tablet 3  . loratadine (CLARITIN) 10 MG tablet Take 10 mg by mouth daily.    . metFORMIN (GLUCOPHAGE) 500 MG tablet Take 1 tablet (500 mg total) by mouth 2 (two) times daily with a meal. 60 tablet 11  . nitroGLYCERIN (NITROSTAT) 0.4 MG SL tablet Place 1 tablet (0.4 mg total) under the tongue every 5 (five) minutes as needed for chest pain. 25 tablet 3  . ranolazine (RANEXA) 1000 MG SR tablet TAKE 1 TABLET BY MOUTH 2 TIMES DAILY. (Patient taking differently: Take 1,000 mg by  mouth 2 (two) times daily.) 180 tablet 1  . rosuvastatin (CRESTOR) 40 MG tablet Take 1 tablet (40 mg total) by mouth daily. 40 tablet 6  . spironolactone (ALDACTONE) 25 MG tablet Take 0.5 tablets (12.5 mg total) by mouth daily. 30 tablet 6  . levothyroxine (SYNTHROID) 50 MCG tablet Take 1 tablet (50 mcg total) by mouth daily. 90 tablet 0   No current facility-administered medications for this visit.    Allergies: Coreg [carvedilol] and Sulfa antibiotics  Past Medical History:  Diagnosis Date  . Anxiety    off xanax  and paxil since 3/13  . Arthritis   . Bilateral carotid artery disease (HCC)    s/p R ICA stent 03/28/2015 with distal protection. Known chronically occluded L ICA. Carotid stent complicated by hypotension and acute [place]  . Cataract   . COPD (chronic obstructive pulmonary disease) (HCC)   . Coronary artery disease    a. s/p CABG in 2011 with LIMA-LAD, SVG-RI/OM, and SVG-PDA b. occluded SVG-PDA by cath in 2015 c. 10/2016: NSTEMI with cath showing thrombus along the distal graft to insertion of SVG-OM2 with DES placed.   . CVA (cerebral infarction)    occured on 10/10 several days after R ICA carotid stenting  . Depression   . Diabetes mellitus   . GERD (gastroesophageal reflux disease)   . GERD with stricture   . High cholesterol   . Hx of adenomatous colonic polyps 05/2020   10 diminutive adenomas  . Hypertension    type 2 NIDDM  . Myocardial infarct (HCC)    x4 last 10 yrs  . Pneumonia    hx  . 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 Skin cancer, basal cell    tumor basal cell rem from lft arm  . Smoker   . Substance abuse [facility])     Past Surgical History:  Procedure Laterality Date  . ABDOMINAL AORTOGRAM W/LOWER EXTREMITY Bilateral 05/31/2019   Procedure: ABDOMINAL AORTOGRAM W/LOWER EXTREMITY;  Surgeon: Maeola Harman, MD;  Location: [facility] INVASIVE CV LAB;  Service: Cardiovascular;  Laterality:  Bilateral;  . AMPUTATION TOE Right 06/02/2019   Procedure: Amputation Right Great Toe and Second Toe;  Surgeon: Maeola Harman, MD;  Location: [facility] OR;  Service: Vascular;  Laterality: Right;  . BACK SURGERY  Jan 2014, Dec 2011   Dr Venetia Maxon  . BALLOON DILATION N/A 06/13/2020   Procedure: BALLOON DILATION;  Surgeon: Iva Boop, MD;  Location: [facility] ENDOSCOPY;  Service: Endoscopy;  Laterality: N/A;  . BIOPSY  06/13/2020   Procedure: BIOPSY;  Surgeon: Iva Boop, MD;  [facility];  Service: Endoscopy;;  . BYPASS GRAFT FEMORAL-PERONEAL Left 11/29/2019   Procedure: Left Bypass Graft Femoral-Peroneal using non reversed Greater Sapphenous vein;  Surgeon: Maeola Harman, MD;  Location: [facility] OR;  Service: Vascular;  Laterality: Left;  . CARDIAC CATHETERIZATION  4/12   Medical Rx  . COLONOSCOPY WITH PROPOFOL N/A 06/13/2020   Procedure: COLONOSCOPY WITH PROPOFOL;  Surgeon: Gatha Mayer, MD;  Location[facility] ENDOSCOPY;  Service: Endoscopy;  Laterality: N/A;  . CORONARY ANGIOGRAM  01/17/14   med rx  . CORONARY ANGIOGRAM  4/13   med Rx  . CORONARY ANGIOGRAM  1/15   Med Rx  . CORONARY ANGIOPLASTY  Jan 2004   RCA  . CORONARY ARTERY BYPASS GRAFT  07/24/2009   L-LAD, SVG-RI/OM, SVG-PDA  . CORONARY STENT INTERVENTION N/A 10/23/2016   Procedure: Coronary Stent Intervention;  Surgeon: Peter M Martinique, MD;  Location: [place] CV LAB;  Service: Cardiovascular;  Laterality: N/A;  . ENDARTERECTOMY FEMORAL Right 06/02/2019   Procedure: Endarterectomy External Iliac  Femoral Artery and Profunda;  Surgeon: Waynetta Sandy, MD;  Location: [place];  Service: Vascular;  Laterality: Right;  . ESOPHAGOGASTRODUODENOSCOPY (EGD) WITH PROPOFOL N/A 06/13/2020   Procedure: ESOPHAGOGASTRODUODENOSCOPY (EGD) WITH PROPOFOL;  Surgeon: Gatha Mayer, MD;  Location: [place];  Service: Endoscopy;  Laterality: N/A;  . EYE SURGERY     cat bil  . FEMORAL ARTERY - FEMORAL ARTERY BYPASS  GRAFT Right 2004   femoral enarterectomy  . FEMORAL-POPLITEAL BYPASS GRAFT Right 06/02/2019   Procedure: RIGHT LEG BYPASS GRAFT FEMORAL-POPLITEAL ARTERY using Gore Propaten Vascular Graft Removable Ring;  Surgeon: Waynetta Sandy, MD;  Location: [place];  Service: Vascular;  Laterality: Right;  . LEFT HEART CATH AND CORS/GRAFTS ANGIOGRAPHY N/A 10/23/2016   Procedure: Left Heart Cath and Cors/Grafts Angiography;  Surgeon: Peter M Martinique, MD;  Location: [place] CV LAB;  Service: Cardiovascular;  Laterality: N/A;  . LEFT HEART CATHETERIZATION WITH CORONARY ANGIOGRAM N/A 10/03/2011   Procedure: LEFT HEART CATHETERIZATION WITH CORONARY ANGIOGRAM;  Surgeon: Pixie Casino, MD;  Location: [facility] CATH LAB;  Service: Cardiovascular;  Laterality: N/A;  . LEFT HEART CATHETERIZATION WITH CORONARY ANGIOGRAM N/A 07/08/2013   Procedure: LEFT HEART CATHETERIZATION WITH CORONARY ANGIOGRAM;  Surgeon: Blane Ohara, MD;  Location[facility] C[facility] LAB;  Service: Cardiovascular;  Laterality: N/A;  . LEFT HEART CATHETERIZATION WITH CORONARY/GRAFT ANGIOGRAM N/A 01/17/2014   Procedure: LEFT HEART CATHETERIZATION WITH Beatrix Fetters;  Surgeon: Troy Sine, MD;  Location: [facility] CATH LAB;  Service: Cardiovascular;  Laterality: N/A;  . LOWER EXTREMITY ANGIOGRAPHY N/A 11/28/2019   Procedure: LOWER EXTREMITY ANGIOGRAPHY;  Surgeon: Waynetta Sandy, MD;  Location: [place] CV LAB;  Service: Cardiovascular;  Laterality: N/A;  . PATCH ANGIOPLASTY Right 06/02/2019   Procedure: Patch Angioplasty using Hemashield Platinum Finesse Patch of the External Iliac Femoral Artery and Profunda;  Surgeon: Waynetta Sandy, MD;  Location: [place];  Service: Vascular;  Laterality: Right;  . PERIPHERAL VASCULAR BALLOON ANGIOPLASTY Right 11/28/2019   Procedure: PERIPHERAL VASCULAR BALLOON ANGIOPLASTY;  Surgeon: Waynetta Sandy, MD;  Location: [place] CV LAB;  Service: Cardiovascular;  Laterality: Right;   Common femoral  . PERIPHERAL VASCULAR CATHETERIZATION N/A 03/28/2015   Procedure: Carotid PTA/Stent Intervention;  Surgeon: Lorretta Harp, MD;  Location: [place] CV LAB;  Service: Cardiovascular;  Laterality: N/A;  . PERIPHERAL VASCULAR INTERVENTION Left 11/28/2019   Procedure: PERIPHERAL VASCULAR INTERVENTION;  Surgeon: Waynetta Sandy, MD;  Location: [place] CV LAB;  Service: Cardiovascular;  Laterality: Left;  external iliac  . POLYPECTOMY  06/13/2020   Procedure: POLYPECTOMY;  Surgeon: Gatha Mayer, MD;  Location: [place];  Service:  Endoscopy;;  . RIGHT/LEFT HEART CATH AND CORONARY/GRAFT ANGIOGRAPHY N/A 09/01/2020   Procedure: RIGHT/LEFT HEART CATH AND CORONARY/GRAFT ANGIOGRAPHY;  Surgeon: Leonie Man, MD;  Location: [place] CV LAB;  Service: Cardiovascular;  Laterality: N/A;  . US EXTREMITY*L*     lft arm tumor removed     Family History  Problem Relation Age of Onset  . Arthritis Mother   . Heart disease Father     Social History   Tobacco Use  . Smoking status: Current Every Day Smoker    Packs/day: 1.00    Years: 48.00    Pack years: 48.00    Types: Cigarettes  . Smokeless tobacco: Never Used  Substance Use Topics  . Alcohol use: Yes    Alcohol/week: 0.0 standard drinks    Comment: socially    Subjective:   Presents today as a new patient; has not had a PCP for almost 6 years; majority of healthcare needs have been and will continue to be managed by cardiology;  Has history of hypothyroidism- has been off Synthroid since last fall; needs refill on Synthroid 50 mcg;  Type 2 Diabetes- Hgba1c was excellent at 5.9 when checked 2 months ago; uses combination of Metformin and Jardiance;   Tobacco abuse- has smoked for 50+ years and admits he is not interested in quitting smoking;   COPD- requesting refill on albuterol to use in his nebulizer;  Chronic low back pain- last MRI done in 2019; asking for referral back to orthopedist;       Objective:  Vitals:   11/02/20 1016  BP: 124/60  Pulse: 72  Temp: (!) 97.5 F (36.4 C)  TempSrc: Oral  SpO2: 99%  Weight: 158 lb 3.2 oz (71.8 kg)  Height: $Remove'5\' 8"'[id number]$  (1.727 m)    General: Well developed, well nourished, in no acute distress  Skin : Warm and dry.  Head: Normocephalic and atraumatic  Eyes: Sclera and conjunctiva clear; pupils round and reactive to light; extraocular movements intact  Ears: External normal; canals clear; tympanic membranes normal  Oropharynx: Pink, supple. No suspicious lesions  Neck: Supple without thyromegaly, adenopathy  Lungs: Respirations unlabored; wheezing noted; CVS exam: normal rate and regular rhythm.  Musculoskeletal: No deformities; no active joint inflammation  Extremities: No edema, cyanosis, clubbing  Vessels: Symmetric bilaterally  Neurologic: Alert and oriented; speech intact; face symmetrical; moves all extremities well; CNII-XII intact without focal deficit    Assessment:  1. Primary hypertension   2. Hypothyroidism, unspecified type   3. Diabetes mellitus due to underlying condition with foot ulcer, without long-term current use of insulin ([place])   4. Chronic bilateral low back pain, unspecified whether sciatica present   5. Chronic obstructive pulmonary disease, unspecified COPD type ([place])   6. NSTEMI (non-ST elevated myocardial infarction) ([place])   7. Depression, recurrent ([place])     Plan:  1. Stable; continue with cardiology; 2. Re-start Synthroid; re-check TSH in 3 months; 3. Most recent Hgba1c is excellent; 4. Refer to Dr. Lorin Mercy whom he has seen in the past; 5. Refill on albuterol for nebulizer; 6. Extensive cardiac history- stressed need to follow-up with his cardiologist;  7. Trial of Lexapro 10 mg daily;  8. Encouraged to quit smoking; discussed lung cancer screen and he defers at this time;  This visit occurred during the SARS-CoV-2 public health emergency.  Safety protocols were in place, including screening  questions prior to the visit, additional usage of staff PPE, and extensive cleaning of exam room while observing appropriate contact  time as indicated for disinfecting solutions.     Return in about 3 months (around 02/02/2021).  Orders Placed This Encounter  Procedures  . CBC with Differential/Platelet  . Comp Met (CMET)  . TSH  . Hemoglobin A1c  . Urine Microalbumin w/creat. ratio  . Ambulatory referral to Orthopedic Surgery    Referral Priority:   Routine    Referral Type:   Surgical    Referral Reason:   Specialty Services Required    Referred to Provider:   Marybelle Killings, MD    Requested Specialty:   Orthopedic Surgery    Number of Visits Requested:   1    Requested Prescriptions   Signed Prescriptions Disp Refills  . levothyroxine (SYNTHROID) 50 MCG tablet 90 tablet 0    Sig: Take 1 tablet (50 mcg total) by mouth daily.  Marland Kitchen escitalopram (LEXAPRO) 10 MG tablet 90 tablet 0    Sig: Take 1 tablet (10 mg total) by mouth daily.  Marland Kitchen albuterol (PROVENTIL) (2.5 MG/3ML) 0.083% nebulizer solution 150 mL 1    Sig: Take 3 mLs (2.5 mg total) by nebulization every 6 (six) hours as needed for wheezing or shortness of breath.

## 2020-11-02 NOTE — Patient Instructions (Addendum)
Please schedule a follow-up with [name];  You will get a call from the orthopedist regarding follow-up on your back;     [place] Follow up on 09/24/2020.   Specialty: Cardiology Why: Also needs lab work next week to check renal function. 08/16/20 at 2:30   at 0830. Garage Code [id number]. Located at [facility] on the 1st floor. Entrance C Contact information: [address] [phone] [place] [phone]

## 2020-11-05 ENCOUNTER — Other Ambulatory Visit: Payer: Self-pay | Admitting: Family

## 2020-11-05 DIAGNOSIS — E875 Hyperkalemia: Secondary | ICD-10-CM

## 2020-11-06 ENCOUNTER — Telehealth: Payer: Self-pay | Admitting: Family

## 2020-11-06 NOTE — Telephone Encounter (Signed)
Noted. Pt aware of results as well. -JMA

## 2020-11-06 NOTE — Telephone Encounter (Signed)
Patient was calling back to let provider know that he will call back once his truck is out the shop to make lab appointment.

## 2020-12-26 ENCOUNTER — Ambulatory Visit: Payer: Medicare Other | Admitting: General Practice

## 2021-01-01 ENCOUNTER — Ambulatory Visit: Payer: Medicare Other | Admitting: Orthopaedic Surgery

## 2021-01-25 ENCOUNTER — Other Ambulatory Visit (HOSPITAL_COMMUNITY): Payer: Self-pay | Admitting: Adult Health

## 2021-01-25 ENCOUNTER — Other Ambulatory Visit: Payer: Self-pay | Admitting: Family

## 2021-01-29 ENCOUNTER — Ambulatory Visit: Payer: Medicare Other | Admitting: Orthopaedic Surgery

## 2021-02-03 ENCOUNTER — Other Ambulatory Visit: Payer: Self-pay | Admitting: Cardiovascular Disease

## 2021-02-05 ENCOUNTER — Encounter: Payer: Self-pay | Admitting: Family

## 2021-02-05 ENCOUNTER — Ambulatory Visit (INDEPENDENT_AMBULATORY_CARE_PROVIDER_SITE_OTHER): Payer: Medicare Other | Admitting: Family

## 2021-02-05 ENCOUNTER — Other Ambulatory Visit: Payer: Self-pay

## 2021-02-05 VITALS — BP 140/70 | HR 82 | Temp 97.5°F | Ht 67.5 in | Wt 159.2 lb

## 2021-02-05 DIAGNOSIS — F419 Anxiety disorder, unspecified: Secondary | ICD-10-CM

## 2021-02-05 DIAGNOSIS — E08621 Diabetes mellitus due to underlying condition with foot ulcer: Secondary | ICD-10-CM | POA: Diagnosis not present

## 2021-02-05 DIAGNOSIS — L97509 Non-pressure chronic ulcer of other part of unspecified foot with unspecified severity: Secondary | ICD-10-CM

## 2021-02-05 DIAGNOSIS — E034 Atrophy of thyroid (acquired): Secondary | ICD-10-CM

## 2021-02-05 DIAGNOSIS — F32A Depression, unspecified: Secondary | ICD-10-CM

## 2021-02-05 MED ORDER — ESCITALOPRAM OXALATE 20 MG PO TABS: 20.0000 mg | ORAL_TABLET | Freq: Every day | ORAL | 3 refills | Status: DC

## 2021-02-05 MED ORDER — GABAPENTIN 300 MG PO CAPS: 600.0000 mg | ORAL_CAPSULE | Freq: Two times a day (BID) | ORAL | 3 refills | Status: DC

## 2021-02-05 NOTE — Patient Instructions (Signed)
Hold the Metformin for now and let's see if that helps stop the low episodes of blood sugar;

## 2021-02-05 NOTE — Progress Notes (Signed)
Jason [name] is a 70 y.o. male with the following history as recorded in EpicCare:  Patient Active Problem List   Diagnosis Date Noted   Protein-calorie malnutrition, severe 09/05/2020   Malnutrition of moderate degree 06/13/2020   Benign neoplasm of ascending colon    Benign neoplasm of transverse colon    Benign neoplasm of sigmoid colon    Lower esophageal ring    Dysphagia    Antiplatelet or antithrombotic long-term use    Dehydration    Emesis    Diarrhea    AKI (acute kidney injury) ([place]) 06/06/2020   Hx of adenomatous colonic polyps 05/2020   Pressure injury of skin 12/04/2019   Critical lower limb ischemia ([place]) 11/26/2019   Wound of lower extremity 11/26/2019   Toe fracture, right 11/26/2019   Amputation of right great toe ([place]) 07/11/2019   Nicotine abuse 06/12/2019   Cellulitis of right foot 06/11/2019   Diabetic foot infection ([place]) 06/11/2019   PAD (peripheral artery disease) ([place]) 06/11/2019   Cellulitis 05/27/2019   Gangrene of toe ([place]) 05/27/2019   Status post lumbar spinal fusion 10/27/2017   Acute on chronic combined systolic and diastolic CHF (congestive heart failure) ([place]) 10/25/2016   NSTEMI (non-ST elevated myocardial infarction) ([place]) 10/22/2016   Hypothyroidism 08/17/2015   RBBB 05/11/2015   Hyponatremia 04/04/2015   Hypotension 04/04/2015   Hemispheric carotid artery syndrome    Carotid artery narrowing 03/28/2015   Carotid stenosis- moderate 2011, 95% 2016 s/p stent 02/21/2014   Unstable angina ([place]) 01/16/2014   Tobacco abuse 01/16/2014   Substance abuse in remission ([place]) 10/01/2012   Radiculitis 02/10/2012   CAD, CABG Feb 2011, cath x 4 since-medical Rx 10/03/2011   PVD, hx Rt femoral endarterectomy 2004 10/03/2011   DM (diabetes mellitus) ([place]) 10/02/2011   HTN (hypertension) 10/02/2011   Hyperlipidemia 10/02/2011    Current Outpatient Medications  Medication Sig Dispense Refill   acetaminophen (TYLENOL) 650 MG CR tablet Take  1,950-2,600 mg by mouth 2 (two) times daily as needed for pain.     albuterol (PROVENTIL) (2.5 MG/3ML) 0.083% nebulizer solution Take 3 mLs (2.5 mg total) by nebulization every 6 (six) hours as needed for wheezing or shortness of breath. 150 mL 1   aspirin EC 81 MG EC tablet Take 1 tablet (81 mg total) by mouth daily. Swallow whole. 30 tablet 11   bisoprolol (ZEBETA) 5 MG tablet Take 0.5 tablets (2.5 mg total) by mouth at bedtime. 30 tablet 6   clopidogrel (PLAVIX) 75 MG tablet TAKE 1 TABLET BY MOUTH EVERY DAY (Patient taking differently: Take 75 mg by mouth daily.) 90 tablet 3   diphenhydrAMINE (BENADRYL) 25 MG tablet Take 75 mg by mouth at bedtime as needed for sleep.     docusate sodium (COLACE) 100 MG capsule Take 100 mg by mouth 2 (two) times daily.     empagliflozin (JARDIANCE) 10 MG TABS tablet Take 1 tablet (10 mg total) by mouth daily. 30 tablet 6   ferrous sulfate 325 (65 FE) MG tablet Take 1 tablet (325 mg total) by mouth daily with breakfast. 30 tablet 3   ibuprofen (ADVIL) 200 MG tablet Take 800 mg by mouth 2 (two) times daily as needed for mild pain.     isosorbide mononitrate (IMDUR) 120 MG 24 hr tablet Take 1 tablet (120 mg total) by mouth daily. 90 tablet 3   levothyroxine (SYNTHROID) 50 MCG tablet Take 1 tablet (50 mcg total) by mouth daily. 90 tablet 0   loratadine (  CLARITIN) 10 MG tablet Take 10 mg by mouth daily.     metFORMIN (GLUCOPHAGE) 500 MG tablet Take 1 tablet (500 mg total) by mouth 2 (two) times daily with a meal. 60 tablet 11   nitroGLYCERIN (NITROSTAT) 0.4 MG SL tablet Place 1 tablet (0.4 mg total) under the tongue every 5 (five) minutes as needed for chest pain. 25 tablet 3   ranolazine (RANEXA) 1000 MG SR tablet TAKE 1 TABLET BY MOUTH TWICE A DAY 180 tablet 1   rosuvastatin (CRESTOR) 40 MG tablet Take 1 tablet (40 mg total) by mouth daily. 40 tablet 6   spironolactone (ALDACTONE) 25 MG tablet Take 0.5 tablets (12.5 mg total) by mouth daily. 30 tablet 6    escitalopram (LEXAPRO) 20 MG tablet Take 1 tablet (20 mg total) by mouth daily. 90 tablet 3   gabapentin (NEURONTIN) 300 MG capsule Take 2 capsules (600 mg total) by mouth 2 (two) times daily. 360 capsule 3   No current facility-administered medications for this visit.    Allergies: Coreg [carvedilol] and Sulfa antibiotics  Past Medical History:  Diagnosis Date   Anxiety    off xanax  and paxil since 3/13   Arthritis    Bilateral carotid artery disease ([place])    s/p R ICA stent 03/28/2015 with distal protection. Known chronically occluded L ICA. Carotid stent complicated by hypotension and acute stroke in [place]   Cataract    COPD (chronic obstructive pulmonary disease) ([place])    Coronary artery disease    a. s/p CABG in 2011 with LIMA-LAD, SVG-RI/OM, and SVG-PDA b. occluded SVG-PDA by cath in 2015 c. 10/2016: NSTEMI with cath showing thrombus along the distal graft to insertion of SVG-OM2 with DES placed.    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)    Skin cancer, basal cell    tumor basal cell rem from lft arm   Smoker    Substance abuse [facility])     Past Surgical History:  Procedure Laterality Date   ABDOMINAL AORTOGRAM W/LOWER EXTREMITY Bilateral 05/31/2019   Procedure: ABDOMINAL AORTOGRAM W/LOWER EXTREMITY;  Surgeon: Waynetta Sandy, MD;  Location: [place] CV LAB;  Service: Cardiovascular;  Laterality: Bilateral;   AMPUTATION TOE Right 06/02/2019   Procedure: Amputation Right Great Toe and Second Toe;  Surgeon: Waynetta Sandy, MD;  Location: [place];  Service: Vascular;  Laterality: Right;   BACK SURGERY  Jan 2014, Dec 2011   Dr Clydene Fake DILATION N/A 06/13/2020   Procedure: Larrie Kass DILATION;  Surgeon: Gatha Mayer, MD;  Location: [place];  Service: Endoscopy;  Laterality: N/A;   BIOPSY  06/13/2020   Procedure: BIOPSY;  Surgeon: Gatha Mayer, MD;  Location: [facility] ENDOSCOPY;  Service: Endoscopy;;   BYPASS GRAFT FEMORAL-PERONEAL Left 11/29/2019   Procedure: Left Bypass Graft Femoral-Peroneal using non reversed Greater Sapphenous vein;  Surgeon: Waynetta Sandy, MD;  Location: [place];  Service: Vascular;  Laterality: Left;   CARDIAC  CATHETERIZATION  4/12   Medical Rx   COLONOSCOPY WITH PROPOFOL N/A 06/13/2020   Procedure: COLONOSCOPY WITH PROPOFOL;  Surgeon: Gatha Mayer, MD;  Location: [facility] ENDOSCOPY;  Service: Endoscopy;  Laterality: N/A;   CORONARY ANGIOGRAM  01/17/14   med rx   CORONARY ANGIOGRAM  4/13   med Rx   CORONARY ANGIOGRAM  1/15   Med Rx   CORONARY ANGIOPLASTY  Jan 2004   RCA   CORONARY ARTERY BYPASS GRAFT  07/24/2009   L-LAD, SVG-RI/OM, SVG-PDA   CORONARY STENT INTERVENTION N/A 10/23/2016   Procedure: Coronary Stent Intervention;  Surgeon: Peter M Martinique, MD;  Location: [place] CV LAB;  Service: Cardiovascular;  Laterality: N/A;   ENDARTERECTOMY FEMORAL Right 06/02/2019   Procedure: Endarterectomy External Iliac  Femoral Artery and Profunda;  Surgeon: Waynetta Sandy, MD;  Location: [place];  Service: Vascular;  Laterality: Right;   ESOPHAGOGASTRODUODENOSCOPY (EGD) WITH PROPOFOL N/A 06/13/2020   Procedure: ESOPHAGOGASTRODUODENOSCOPY (EGD) WITH PROPOFOL;  Surgeon: Gatha Mayer, MD;  Location: [place];  Service: Endoscopy;  Laterality: N/A;   EYE SURGERY     cat bil   FEMORAL ARTERY - FEMORAL ARTERY BYPASS GRAFT Right 2004   femoral enarterectomy   FEMORAL-POPLITEAL BYPASS GRAFT Right 06/02/2019   Procedure: RIGHT LEG BYPASS GRAFT FEMORAL-POPLITEAL ARTERY using Gore Propaten  Vascular Graft Removable Ring;  Surgeon: Waynetta Sandy, MD;  Location: [place];  Service: Vascular;  Laterality: Right;   LEFT HEART CATH AND CORS/GRAFTS ANGIOGRAPHY N/A 10/23/2016   Procedure: Left Heart Cath and Cors/Grafts Angiography;  Surgeon: Peter M Martinique, MD;  Location: [place] CV LAB;  Service: Cardiovascular;  Laterality: N/A;   LEFT HEART CATHETERIZATION WITH CORONARY ANGIOGRAM N/A 10/03/2011   Procedure: LEFT HEART CATHETERIZATION WITH CORONARY ANGIOGRAM;  Surgeon: Pixie Casino, MD;  Location: [facility] CATH LAB;  Service: Cardiovascular;  Laterality: N/A;   LEFT HEART CATHETERIZATION WITH CORONARY ANGIOGRAM N/A 07/08/2013   Procedure: LEFT HEART CATHETERIZATION WITH CORONARY ANGIOGRAM;  Surgeon: Blane Ohara, MD;  Location: [facility] CATH LAB;  Service: Cardiovascular;  Laterality: N/A;   LEFT HEART CATHETERIZATION WITH CORONARY/GRAFT ANGIOGRAM N/A 01/17/2014   Procedure: LEFT HEART CATHETERIZATION WITH Beatrix Fetters;  Surgeon: Troy Sine, MD;  Location: [facility] CATH LAB;  Service: Cardiovascular;  Laterality: N/A;   LOWER EXTREMITY ANGIOGRAPHY N/A 11/28/2019   Procedure: LOWER EXTREMITY ANGIOGRAPHY;  Surgeon: Waynetta Sandy, MD;  Location: [place] CV LAB;  Service: Cardiovascular;  Laterality: N/A;   PATCH ANGIOPLASTY Right 06/02/2019   Procedure: Patch Angioplasty using Hemashield Platinum Finesse Patch of the External Iliac Femoral Artery and Profunda;  Surgeon: Waynetta Sandy, MD;  Location: [place];  Service: Vascular;  Laterality: Right;   PERIPHERAL VASCULAR BALLOON ANGIOPLASTY Right 11/28/2019   Procedure: PERIPHERAL VASCULAR BALLOON ANGIOPLASTY;  Surgeon: Waynetta Sandy, MD;  Location: [place] [facility] LAB;  Service: Cardiovascular;  Laterality: Right;  Common femoral   PERIPHERAL VASCULAR CATHETERIZATION N/A 03/28/2015   Procedure: Carotid PTA/Stent Intervention;  Surgeon: Lorretta Harp, MD;  Location: [place] CV LAB;  Service:  Cardiovascular;  Laterality: N/A;   PERIPHERAL VASCULAR INTERVENTION Left 11/28/2019   Procedure: PERIPHERAL VASCULAR INTERVENTION;  Surgeon: Waynetta Sandy, MD;  Location: [place] CV LAB;  Service: Cardiovascular;  Laterality: Left;  external iliac   POLYPECTOMY  06/13/2020   Procedure: POLYPECTOMY;  Surgeon: Gatha Mayer, MD;  Location: [place];  Service: Endoscopy;;   RIGHT/LEFT HEART CATH AND CORONARY/GRAFT ANGIOGRAPHY N/A 09/01/2020  Procedure: RIGHT/LEFT HEART CATH AND CORONARY/GRAFT ANGIOGRAPHY;  Surgeon: Leonie Man, MD;  Location: [place] CV LAB;  Service: Cardiovascular;  Laterality: N/A;   US EXTREMITY*L*     lft arm tumor removed     Family History  Problem Relation Age of Onset   Arthritis Mother    Heart disease Father     Social History   Tobacco Use   Smoking status: Every Day    Packs/day: 1.00    Years: 48.00    Pack years: 48.00    Types: Cigarettes   Smokeless tobacco: Never  Substance Use Topics   Alcohol use: Yes    Alcohol/week: 0.0 standard drinks    Comment: socially    Subjective:  3 month follow up on re-start of Synthroid; has been taking regularly;  Was also started on Lexapro at last office visit- does feel is helping some but would like to consider increasing dosage;   Does have some concerns for episodes of low blood sugar; takes Jardiance for diabetes and heart failure benefit; admits that he will often hold his Metformin when his blood sugar gets very low.     Objective:  Vitals:   02/05/21 1312  BP: 140/70  Pulse: 82  Temp: (!) 97.5 F (36.4 C)  TempSrc: Oral  SpO2: 92%  Weight: 159 lb 3.2 oz (72.2 kg)  Height: 5' 7.5" (1.715 m)    General: Well developed, well nourished, in no acute distress  Skin : Warm and dry.  Head: Normocephalic and atraumatic  Eyes: Sclera and conjunctiva clear; pupils round and reactive to light; extraocular movements intact  Ears: External normal; canals clear; tympanic  membranes normal  Oropharynx: Pink, supple. No suspicious lesions  Neck: Supple without thyromegaly, adenopathy  Lungs: Respirations unlabored; clear to auscultation bilaterally without wheeze, rales, rhonchi  CVS exam: normal rate and regular rhythm.  Neurologic: Alert and oriented; speech intact; face symmetrical; moves all extremities well; CNII-XII intact without focal deficit   Assessment:  1. Hypothyroidism due to acquired atrophy of thyroid   2. Diabetes mellitus due to underlying condition with foot ulcer, without long-term current use of insulin ([place])   3. Anxiety and depression     Plan:  Check TSH today; will adjust Synthroid dosage as needed based on lab results; Most recent Hgba1c was at 5.9- patient does mention episodes of low blood sugar; will have him hold Metformin until further notice; follow up to be determined; Increase Lexapro to 20 mg daily;   Follow up in 3-6 months, sooner prn.  This visit occurred during the SARS-CoV-2 public health emergency.  Safety protocols were in place, including screening questions prior to the visit, additional usage of staff PPE, and extensive cleaning of exam room while observing appropriate contact time as indicated for disinfecting solutions.    No follow-ups on file.  Orders Placed This Encounter  Procedures   Comp Met (CMET)   Hemoglobin A1c   TSH    Requested Prescriptions   Signed Prescriptions Disp Refills   escitalopram (LEXAPRO) 20 MG tablet 90 tablet 3    Sig: Take 1 tablet (20 mg total) by mouth daily.   gabapentin (NEURONTIN) 300 MG capsule 360 capsule 3    Sig: Take 2 capsules (600 mg total) by mouth 2 (two) times daily.

## 2021-02-06 ENCOUNTER — Other Ambulatory Visit: Payer: Self-pay | Admitting: Family

## 2021-02-06 LAB — COMPREHENSIVE METABOLIC PANEL
ALT: 15 U/L (ref 0–53)
AST: 15 U/L (ref 0–37)
Albumin: 4.4 g/dL (ref 3.5–5.2)
Alkaline Phosphatase: 83 U/L (ref 39–117)
BUN: 9 mg/dL (ref 6–23)
CO2: 22 mEq/L (ref 19–32)
Calcium: 9.8 mg/dL (ref 8.4–10.5)
Chloride: 100 mEq/L (ref 96–112)
Creatinine, Ser: 1.29 mg/dL (ref 0.40–1.50)
GFR: 56.99 mL/min — ABNORMAL LOW (ref >60.00)
Glucose, Bld: 118 mg/dL — ABNORMAL HIGH (ref 70–99)
Potassium: 4.6 mEq/L (ref 3.5–5.1)
Sodium: 132 mEq/L — ABNORMAL LOW (ref 135–145)
Total Bilirubin: 0.3 mg/dL (ref 0.2–1.2)
Total Protein: 7.2 g/dL (ref 6.0–8.3)

## 2021-02-06 LAB — HEMOGLOBIN A1C: Hgb A1c MFr Bld: 6.2 % (ref 4.6–6.5)

## 2021-02-06 LAB — TSH: TSH: 2.71 u[IU]/mL (ref 0.35–5.50)

## 2021-02-06 MED ORDER — LEVOTHYROXINE SODIUM 50 MCG PO TABS: 50.0000 ug | ORAL_TABLET | Freq: Every day | ORAL | 1 refills | Status: AC

## 2021-02-27 ENCOUNTER — Ambulatory Visit: Payer: Medicare Other | Admitting: Medical

## 2021-03-06 ENCOUNTER — Other Ambulatory Visit: Payer: Self-pay | Admitting: Cardiovascular Disease

## 2021-03-07 ENCOUNTER — Other Ambulatory Visit: Payer: Self-pay | Admitting: Medical

## 2021-03-07 NOTE — Telephone Encounter (Signed)
This is Dr. Kelly's pt. °

## 2021-03-17 ENCOUNTER — Other Ambulatory Visit: Payer: Self-pay | Admitting: Cardiovascular Disease

## 2021-03-21 ENCOUNTER — Other Ambulatory Visit: Payer: Self-pay | Admitting: Medical

## 2021-04-15 ENCOUNTER — Other Ambulatory Visit: Payer: Self-pay | Admitting: Cardiovascular Disease

## 2021-04-19 ENCOUNTER — Other Ambulatory Visit: Payer: Self-pay | Admitting: Cardiovascular Disease

## 2021-04-24 ENCOUNTER — Other Ambulatory Visit: Payer: Self-pay | Admitting: Medical

## 2021-04-24 ENCOUNTER — Ambulatory Visit: Payer: Medicare Other | Admitting: Medical

## 2021-05-07 ENCOUNTER — Ambulatory Visit: Payer: Medicare Other | Admitting: Family

## 2021-05-10 ENCOUNTER — Other Ambulatory Visit: Payer: Self-pay | Admitting: Medical

## 2021-05-10 NOTE — Telephone Encounter (Signed)
This is Dr. Kelly's pt. °

## 2021-05-24 ENCOUNTER — Other Ambulatory Visit: Payer: Self-pay | Admitting: Cardiovascular Disease

## 2021-05-28 ENCOUNTER — Ambulatory Visit: Payer: Medicare Other | Admitting: Family

## 2021-06-07 ENCOUNTER — Other Ambulatory Visit: Payer: Self-pay | Admitting: Cardiovascular Disease

## 2021-06-20 ENCOUNTER — Encounter: Payer: Self-pay | Admitting: Internal Medicine

## 2021-07-15 ENCOUNTER — Other Ambulatory Visit: Payer: Self-pay | Admitting: Cardiovascular Disease

## 2021-07-29 ENCOUNTER — Other Ambulatory Visit (HOSPITAL_COMMUNITY): Payer: Self-pay | Admitting: Adult Health

## 2021-07-31 NOTE — Telephone Encounter (Signed)
This is Dr. Kelly's pt. °

## 2021-08-16 ENCOUNTER — Other Ambulatory Visit (HOSPITAL_COMMUNITY): Payer: Self-pay | Admitting: Adult Health

## 2021-08-19 NOTE — Telephone Encounter (Signed)
This is Dr. Kelly's pt. °

## 2021-09-02 ENCOUNTER — Telehealth: Payer: Self-pay | Admitting: Family

## 2021-09-02 NOTE — Telephone Encounter (Signed)
Pt will like clarification on how many pills to take daily for Escitalopram  ? ?Pt states when he takes 2 pills it helps his back more for at least 4 hours and give him time to do things within the house  ? ? ?Please advise ?

## 2021-09-03 NOTE — Telephone Encounter (Signed)
In regards to last message: ? ?1) Escitalopram is not for back pain; it is a medication for anxiety/ depression. ?2) He should not be taking more than 20 mg daily of Escitalopram. ?3) If he is having back pain, he needs an appointment; ?4) He is actually overdue for appointment anyway to follow up on his thyroid and Type 2 Diabetes; he was due for an OV in November 2022;  ?

## 2021-09-03 NOTE — Telephone Encounter (Signed)
I have called the pt and informed him that we can not fill his rx early. It will be hundreds of dollars out of pocket and the next refill is due on Monday 09/09/21. I have instructed him not to double up on the 20 mg of Lexapro. Pt then reports that after taking 40 mg he feels good enough to work around his house and his chest feels more loose. I informed him that I will relay the message to the provider and see what her recommendations will be in the mean time. He will make accommodations to pick up his medication from the pharmacy.    ? ?I have also scheduled his appointment for 10/08/21 at 2:20pm and he stated that hopefully he will get his truck fixed by that time.  ?

## 2021-10-08 ENCOUNTER — Ambulatory Visit: Payer: Medicare Other | Admitting: Family

## 2021-10-09 NOTE — Telephone Encounter (Signed)
No longer on med list.  ? ?

## 2021-10-22 ENCOUNTER — Telehealth: Payer: Self-pay | Admitting: Family

## 2021-10-22 NOTE — Telephone Encounter (Signed)
Left message for patient to call back and schedule Medicare Annual Wellness Visit (AWV) either virtually or phone  ? ? ?AWVI DUE 03/23/2018 PER [place] ? please schedule at anytime with health coach 2 ? ?This should be a 45 minute visit.  ?I left my direct # [phone] ?

## 2021-11-07 ENCOUNTER — Other Ambulatory Visit: Payer: Self-pay | Admitting: Cardiovascular Disease

## 2021-11-25 ENCOUNTER — Telehealth: Payer: Self-pay | Admitting: Family

## 2021-11-25 NOTE — Telephone Encounter (Signed)
Left message for patient to call back and schedule Medicare Annual Wellness Visit (AWV).   Please offer to do virtually or by telephone.  Left office number and my jabber [phone].  AWVI eligible as of 03/23/2018  Please schedule at anytime with Nurse Health Advisor.

## 2022-03-19 ENCOUNTER — Other Ambulatory Visit: Payer: Self-pay | Admitting: Family

## 2022-04-07 ENCOUNTER — Other Ambulatory Visit: Payer: Self-pay | Admitting: Cardiovascular Disease

## 2022-04-12 ENCOUNTER — Other Ambulatory Visit: Payer: Self-pay | Admitting: Family

## 2022-04-14 ENCOUNTER — Telehealth: Payer: Self-pay | Admitting: Family

## 2022-04-14 NOTE — Telephone Encounter (Signed)
error 

## 2022-04-14 NOTE — Telephone Encounter (Signed)
He is overdue for OV; needs follow up on his diabetes and thyroid; we will not be able to give further refills without OV.

## 2022-04-14 NOTE — Telephone Encounter (Signed)
I have called the pt and there was no answer. I left a message to call back.

## 2022-04-14 NOTE — Telephone Encounter (Signed)
I have called the pt back and we have scheduled the appointment for next Tuesday at 2pm. Pt stated that he now has transportation and would be able to make the appointment. He also stated that he would like to have his Gabapentin for his neuropathy. He has been out of medication for the last few weeks. Please assist.

## 2022-04-14 NOTE — Telephone Encounter (Signed)
Pt called stating he has an appt next week, but he really needs a refill on albuterol (PROVENTIL) (2.5 MG/3ML) 0.083% nebulizer solution to be sent to [facility] on [address] prior to appt.

## 2022-04-15 ENCOUNTER — Other Ambulatory Visit: Payer: Self-pay | Admitting: Family

## 2022-04-15 MED ORDER — ALBUTEROL SULFATE (2.5 MG/3ML) 0.083% IN NEBU: 2.5000 mg | INHALATION_SOLUTION | Freq: Four times a day (QID) | RESPIRATORY_TRACT | 0 refills | Status: AC | PRN

## 2022-04-15 MED ORDER — GABAPENTIN 300 MG PO CAPS: 600.0000 mg | ORAL_CAPSULE | Freq: Two times a day (BID) | ORAL | 0 refills | Status: AC

## 2022-04-15 NOTE — Telephone Encounter (Signed)
Noted pt stated he will be here at his next appt.

## 2022-04-22 ENCOUNTER — Emergency Department (HOSPITAL_COMMUNITY): Payer: Medicare Other

## 2022-04-22 ENCOUNTER — Encounter (HOSPITAL_COMMUNITY): Payer: Self-pay

## 2022-04-22 ENCOUNTER — Other Ambulatory Visit: Payer: Self-pay

## 2022-04-22 ENCOUNTER — Inpatient Hospital Stay (HOSPITAL_COMMUNITY)
Admission: EM | Admit: 2022-04-22 | Discharge: 2022-04-26 | DRG: 378 | Disposition: A | Discharge status: Home / Self Care | Payer: Medicare Other | Attending: Internal Medicine | Admitting: Internal Medicine

## 2022-04-22 ENCOUNTER — Ambulatory Visit: Payer: Medicare Other | Admitting: Family

## 2022-04-22 DIAGNOSIS — Z79899 Other long term (current) drug therapy: Secondary | ICD-10-CM

## 2022-04-22 DIAGNOSIS — Z86010 Personal history of colonic polyps: Secondary | ICD-10-CM

## 2022-04-22 DIAGNOSIS — Z7982 Long term (current) use of aspirin: Secondary | ICD-10-CM

## 2022-04-22 DIAGNOSIS — S2243XA Multiple fractures of ribs, bilateral, initial encounter for closed fracture: Secondary | ICD-10-CM

## 2022-04-22 DIAGNOSIS — D62 Acute posthemorrhagic anemia: Secondary | ICD-10-CM | POA: Diagnosis not present

## 2022-04-22 DIAGNOSIS — Z8261 Family history of arthritis: Secondary | ICD-10-CM

## 2022-04-22 DIAGNOSIS — W19XXXA Unspecified fall, initial encounter: Secondary | ICD-10-CM

## 2022-04-22 DIAGNOSIS — I25118 Atherosclerotic heart disease of native coronary artery with other forms of angina pectoris: Secondary | ICD-10-CM | POA: Diagnosis not present

## 2022-04-22 DIAGNOSIS — E7800 Pure hypercholesterolemia, unspecified: Secondary | ICD-10-CM | POA: Diagnosis present

## 2022-04-22 DIAGNOSIS — K264 Chronic or unspecified duodenal ulcer with hemorrhage: Principal | ICD-10-CM | POA: Diagnosis present

## 2022-04-22 DIAGNOSIS — K922 Gastrointestinal hemorrhage, unspecified: Secondary | ICD-10-CM

## 2022-04-22 DIAGNOSIS — Z882 Allergy status to sulfonamides status: Secondary | ICD-10-CM

## 2022-04-22 DIAGNOSIS — F17210 Nicotine dependence, cigarettes, uncomplicated: Secondary | ICD-10-CM | POA: Diagnosis present

## 2022-04-22 DIAGNOSIS — K921 Melena: Secondary | ICD-10-CM | POA: Diagnosis present

## 2022-04-22 DIAGNOSIS — Z9181 History of falling: Secondary | ICD-10-CM

## 2022-04-22 DIAGNOSIS — K219 Gastro-esophageal reflux disease without esophagitis: Secondary | ICD-10-CM | POA: Diagnosis present

## 2022-04-22 DIAGNOSIS — E119 Type 2 diabetes mellitus without complications: Secondary | ICD-10-CM

## 2022-04-22 DIAGNOSIS — E1151 Type 2 diabetes mellitus with diabetic peripheral angiopathy without gangrene: Secondary | ICD-10-CM | POA: Diagnosis present

## 2022-04-22 DIAGNOSIS — J449 Chronic obstructive pulmonary disease, unspecified: Secondary | ICD-10-CM | POA: Diagnosis present

## 2022-04-22 DIAGNOSIS — F32A Depression, unspecified: Secondary | ICD-10-CM | POA: Diagnosis present

## 2022-04-22 DIAGNOSIS — Z888 Allergy status to other drugs, medicaments and biological substances status: Secondary | ICD-10-CM

## 2022-04-22 DIAGNOSIS — E08621 Diabetes mellitus due to underlying condition with foot ulcer: Secondary | ICD-10-CM

## 2022-04-22 DIAGNOSIS — Z8673 Personal history of transient ischemic attack (TIA), and cerebral infarction without residual deficits: Secondary | ICD-10-CM

## 2022-04-22 DIAGNOSIS — K2971 Gastritis, unspecified, with bleeding: Secondary | ICD-10-CM | POA: Diagnosis present

## 2022-04-22 DIAGNOSIS — Z7902 Long term (current) use of antithrombotics/antiplatelets: Secondary | ICD-10-CM

## 2022-04-22 DIAGNOSIS — S2249XA Multiple fractures of ribs, unspecified side, initial encounter for closed fracture: Secondary | ICD-10-CM | POA: Diagnosis present

## 2022-04-22 DIAGNOSIS — L97509 Non-pressure chronic ulcer of other part of unspecified foot with unspecified severity: Secondary | ICD-10-CM

## 2022-04-22 DIAGNOSIS — I252 Old myocardial infarction: Secondary | ICD-10-CM

## 2022-04-22 DIAGNOSIS — E785 Hyperlipidemia, unspecified: Secondary | ICD-10-CM | POA: Diagnosis present

## 2022-04-22 DIAGNOSIS — Z951 Presence of aortocoronary bypass graft: Secondary | ICD-10-CM

## 2022-04-22 DIAGNOSIS — Z79890 Hormone replacement therapy (postmenopausal): Secondary | ICD-10-CM

## 2022-04-22 DIAGNOSIS — E782 Mixed hyperlipidemia: Secondary | ICD-10-CM

## 2022-04-22 DIAGNOSIS — Z955 Presence of coronary angioplasty implant and graft: Secondary | ICD-10-CM

## 2022-04-22 DIAGNOSIS — Z7984 Long term (current) use of oral hypoglycemic drugs: Secondary | ICD-10-CM

## 2022-04-22 DIAGNOSIS — Z794 Long term (current) use of insulin: Secondary | ICD-10-CM

## 2022-04-22 DIAGNOSIS — K640 First degree hemorrhoids: Secondary | ICD-10-CM | POA: Diagnosis present

## 2022-04-22 DIAGNOSIS — L089 Local infection of the skin and subcutaneous tissue, unspecified: Secondary | ICD-10-CM

## 2022-04-22 DIAGNOSIS — E11628 Type 2 diabetes mellitus with other skin complications: Secondary | ICD-10-CM

## 2022-04-22 DIAGNOSIS — I2510 Atherosclerotic heart disease of native coronary artery without angina pectoris: Secondary | ICD-10-CM | POA: Diagnosis present

## 2022-04-22 DIAGNOSIS — I10 Essential (primary) hypertension: Secondary | ICD-10-CM | POA: Diagnosis present

## 2022-04-22 DIAGNOSIS — Z85828 Personal history of other malignant neoplasm of skin: Secondary | ICD-10-CM

## 2022-04-22 DIAGNOSIS — E039 Hypothyroidism, unspecified: Secondary | ICD-10-CM | POA: Diagnosis present

## 2022-04-22 DIAGNOSIS — Z8249 Family history of ischemic heart disease and other diseases of the circulatory system: Secondary | ICD-10-CM

## 2022-04-22 DIAGNOSIS — N179 Acute kidney failure, unspecified: Secondary | ICD-10-CM | POA: Diagnosis present

## 2022-04-22 DIAGNOSIS — I959 Hypotension, unspecified: Secondary | ICD-10-CM | POA: Diagnosis not present

## 2022-04-22 DIAGNOSIS — Z89421 Acquired absence of other right toe(s): Secondary | ICD-10-CM

## 2022-04-22 DIAGNOSIS — I452 Bifascicular block: Secondary | ICD-10-CM | POA: Diagnosis present

## 2022-04-22 LAB — CBC WITH DIFFERENTIAL/PLATELET
Abs Immature Granulocytes: 0.15 10*3/uL — ABNORMAL HIGH (ref 0.00–0.07)
Basophils Absolute: 0 10*3/uL (ref 0.0–0.1)
Basophils Relative: 0 %
Eosinophils Absolute: 0.1 10*3/uL (ref 0.0–0.5)
Eosinophils Relative: 1 %
HCT: 29.8 % — ABNORMAL LOW (ref 39.0–52.0)
Hemoglobin: 9.8 g/dL — ABNORMAL LOW (ref 13.0–17.0)
Immature Granulocytes: 2 %
Lymphocytes Relative: 14 %
Lymphs Abs: 1.5 10*3/uL (ref 0.7–4.0)
MCH: 33.7 pg (ref 26.0–34.0)
MCHC: 32.9 g/dL (ref 30.0–36.0)
MCV: 102.4 fL — ABNORMAL HIGH (ref 80.0–100.0)
Monocytes Absolute: 0.9 10*3/uL (ref 0.1–1.0)
Monocytes Relative: 8 %
Neutro Abs: 7.7 10*3/uL (ref 1.7–7.7)
Neutrophils Relative %: 75 %
Platelets: 231 10*3/uL (ref 150–400)
RBC: 2.91 MIL/uL — ABNORMAL LOW (ref 4.22–5.81)
RDW: 13.8 % (ref 11.5–15.5)
WBC: 10.2 10*3/uL (ref 4.0–10.5)
nRBC: 0 % (ref 0.0–0.2)

## 2022-04-22 LAB — URINALYSIS, ROUTINE W REFLEX MICROSCOPIC
Bilirubin Urine: NEGATIVE
Glucose, UA: NEGATIVE mg/dL
Hgb urine dipstick: NEGATIVE
Ketones, ur: NEGATIVE mg/dL
Nitrite: NEGATIVE
Protein, ur: NEGATIVE mg/dL
Specific Gravity, Urine: 1.038 — ABNORMAL HIGH (ref 1.005–1.030)
WBC, UA: >50 WBC/hpf — ABNORMAL HIGH (ref 0–5)
pH: 7 (ref 5.0–8.0)

## 2022-04-22 LAB — COMPREHENSIVE METABOLIC PANEL
ALT: 13 U/L (ref 0–44)
AST: 14 U/L — ABNORMAL LOW (ref 15–41)
Albumin: 3 g/dL — ABNORMAL LOW (ref 3.5–5.0)
Alkaline Phosphatase: 70 U/L (ref 38–126)
Anion gap: 10 (ref 5–15)
BUN: 17 mg/dL (ref 8–23)
CO2: 18 mmol/L — ABNORMAL LOW (ref 22–32)
Calcium: 8.9 mg/dL (ref 8.9–10.3)
Chloride: 105 mmol/L (ref 98–111)
Creatinine, Ser: 1.42 mg/dL — ABNORMAL HIGH (ref 0.61–1.24)
GFR, Estimated: 53 mL/min — ABNORMAL LOW (ref >60)
Glucose, Bld: 191 mg/dL — ABNORMAL HIGH (ref 70–99)
Potassium: 4.4 mmol/L (ref 3.5–5.1)
Sodium: 133 mmol/L — ABNORMAL LOW (ref 135–145)
Total Bilirubin: 0.6 mg/dL (ref 0.3–1.2)
Total Protein: 5.5 g/dL — ABNORMAL LOW (ref 6.5–8.1)

## 2022-04-22 LAB — I-STAT CHEM 8, ED
BUN: 17 mg/dL (ref 8–23)
Calcium, Ion: 0.92 mmol/L — ABNORMAL LOW (ref 1.15–1.40)
Chloride: 107 mmol/L (ref 98–111)
Creatinine, Ser: 1.2 mg/dL (ref 0.61–1.24)
Glucose, Bld: 195 mg/dL — ABNORMAL HIGH (ref 70–99)
HCT: 28 % — ABNORMAL LOW (ref 39.0–52.0)
Hemoglobin: 9.5 g/dL — ABNORMAL LOW (ref 13.0–17.0)
Potassium: 4.3 mmol/L (ref 3.5–5.1)
Sodium: 131 mmol/L — ABNORMAL LOW (ref 135–145)
TCO2: 17 mmol/L — ABNORMAL LOW (ref 22–32)

## 2022-04-22 LAB — POC OCCULT BLOOD, ED: Fecal Occult Bld: POSITIVE — AB

## 2022-04-22 LAB — ETHANOL: Alcohol, Ethyl (B): <10 mg/dL (ref <10)

## 2022-04-22 MED ORDER — ONDANSETRON HCL 4 MG/2ML IJ SOLN: 4.0000 mg | Freq: Four times a day (QID) | INTRAMUSCULAR | Status: DC | PRN

## 2022-04-22 MED ORDER — IOHEXOL 350 MG/ML SOLN
70.0000 mL | Freq: Once | INTRAVENOUS | Status: AC | PRN
  Administered 2022-04-22: 70 mL via INTRAVENOUS

## 2022-04-22 MED ORDER — GABAPENTIN 300 MG PO CAPS
600.0000 mg | ORAL_CAPSULE | Freq: Two times a day (BID) | ORAL | Status: DC
  Administered 2022-04-22 – 2022-04-26 (×7): 600 mg via ORAL
  Filled 2022-04-22 (×7): qty 2

## 2022-04-22 MED ORDER — ISOSORBIDE MONONITRATE ER 60 MG PO TB24
120.0000 mg | ORAL_TABLET | Freq: Every day | ORAL | Status: DC
  Administered 2022-04-25 – 2022-04-26 (×2): 120 mg via ORAL
  Filled 2022-04-22 (×2): qty 2

## 2022-04-22 MED ORDER — LORAZEPAM 1 MG PO TABS: 1.0000 mg | ORAL_TABLET | Freq: Once | ORAL | Status: DC

## 2022-04-22 MED ORDER — INSULIN ASPART 100 UNIT/ML IJ SOLN
0.0000 [IU] | Freq: Three times a day (TID) | INTRAMUSCULAR | Status: DC
  Administered 2022-04-23 (×2): 3 [IU] via SUBCUTANEOUS
  Administered 2022-04-24 (×2): 2 [IU] via SUBCUTANEOUS
  Administered 2022-04-25: 5 [IU] via SUBCUTANEOUS
  Administered 2022-04-25 – 2022-04-26 (×3): 2 [IU] via SUBCUTANEOUS

## 2022-04-22 MED ORDER — FENTANYL CITRATE PF 50 MCG/ML IJ SOSY
50.0000 ug | PREFILLED_SYRINGE | Freq: Once | INTRAMUSCULAR | Status: AC
  Administered 2022-04-22: 50 ug via INTRAVENOUS
  Filled 2022-04-22: qty 1

## 2022-04-22 MED ORDER — RANOLAZINE ER 500 MG PO TB12
500.0000 mg | ORAL_TABLET | Freq: Two times a day (BID) | ORAL | Status: DC
  Administered 2022-04-22 – 2022-04-26 (×7): 500 mg via ORAL
  Filled 2022-04-22 (×10): qty 1

## 2022-04-22 MED ORDER — OXYCODONE-ACETAMINOPHEN 5-325 MG PO TABS
1.0000 | ORAL_TABLET | Freq: Four times a day (QID) | ORAL | Status: DC | PRN
  Administered 2022-04-22 – 2022-04-25 (×6): 1 via ORAL
  Filled 2022-04-22 (×7): qty 1

## 2022-04-22 MED ORDER — LEVOTHYROXINE SODIUM 50 MCG PO TABS
50.0000 ug | ORAL_TABLET | Freq: Every day | ORAL | Status: DC
  Administered 2022-04-23 – 2022-04-26 (×4): 50 ug via ORAL
  Filled 2022-04-22 (×4): qty 1

## 2022-04-22 MED ORDER — DOCUSATE SODIUM 100 MG PO CAPS: 100.0000 mg | ORAL_CAPSULE | Freq: Two times a day (BID) | ORAL | Status: DC | PRN

## 2022-04-22 MED ORDER — ACETAMINOPHEN 325 MG PO TABS
650.0000 mg | ORAL_TABLET | Freq: Four times a day (QID) | ORAL | Status: DC | PRN
  Administered 2022-04-23 – 2022-04-25 (×2): 650 mg via ORAL
  Filled 2022-04-22 (×2): qty 2

## 2022-04-22 MED ORDER — ACETAMINOPHEN 650 MG RE SUPP: 650.0000 mg | Freq: Four times a day (QID) | RECTAL | Status: DC | PRN

## 2022-04-22 MED ORDER — PANTOPRAZOLE SODIUM 40 MG PO TBEC
40.0000 mg | DELAYED_RELEASE_TABLET | Freq: Every day | ORAL | Status: DC
  Administered 2022-04-23 – 2022-04-24 (×2): 40 mg via ORAL
  Filled 2022-04-22 (×2): qty 1

## 2022-04-22 MED ORDER — EMPAGLIFLOZIN 10 MG PO TABS
10.0000 mg | ORAL_TABLET | Freq: Every day | ORAL | Status: DC
  Administered 2022-04-23 – 2022-04-26 (×3): 10 mg via ORAL
  Filled 2022-04-22 (×4): qty 1

## 2022-04-22 MED ORDER — ROSUVASTATIN CALCIUM 20 MG PO TABS
40.0000 mg | ORAL_TABLET | Freq: Every day | ORAL | Status: DC
  Administered 2022-04-22 – 2022-04-26 (×4): 40 mg via ORAL
  Filled 2022-04-22 (×4): qty 2

## 2022-04-22 MED ORDER — LORAZEPAM 2 MG/ML IJ SOLN
0.5000 mg | INTRAMUSCULAR | Status: DC | PRN
  Filled 2022-04-22: qty 1

## 2022-04-22 MED ORDER — ALBUTEROL SULFATE (2.5 MG/3ML) 0.083% IN NEBU: 2.5000 mg | INHALATION_SOLUTION | Freq: Four times a day (QID) | RESPIRATORY_TRACT | Status: DC | PRN

## 2022-04-22 MED ORDER — ONDANSETRON HCL 4 MG PO TABS: 4.0000 mg | ORAL_TABLET | Freq: Four times a day (QID) | ORAL | Status: DC | PRN

## 2022-04-22 MED ORDER — ESCITALOPRAM OXALATE 20 MG PO TABS
20.0000 mg | ORAL_TABLET | Freq: Every day | ORAL | Status: DC
  Administered 2022-04-22 – 2022-04-26 (×4): 20 mg via ORAL
  Filled 2022-04-22 (×3): qty 1
  Filled 2022-04-22: qty 2

## 2022-04-22 NOTE — Assessment & Plan Note (Signed)
Incentive spirometry Percocet PRN pain if needed.

## 2022-04-22 NOTE — ED Triage Notes (Signed)
Pt BIBA from home. Pt fell Friday at home. No LOC. Pt does take Plavix. Pt is concerned for left rib fx.   Pt has had 4 blood stools this AM.

## 2022-04-22 NOTE — Assessment & Plan Note (Addendum)
Hematochezia more suggestive of lower bleed, though history of fall followed by rib pain and taking ibuprofen more suspicious for NSAID ulcer. 1. See GI note: 1. PPI 2. Hold DAPT and allow washout 3. tentative plan = EGD / colonoscopy on 11/2 4. If rapid hematochezia in mean time do GIB CTA and call IR if positive for embolization.

## 2022-04-22 NOTE — H&P (Addendum)
History and Physical    Patient: Jason [name] ION:[id number] DOB: [birth year] DOA: 04/22/2022 DOS: the patient was seen and examined on 04/22/2022 PCP: Marrian Salvage, [place]  Patient coming from: Home  Chief Complaint:  Chief Complaint  Patient presents with   Fall   HPI: Jason [name] is a 71 y.o. male with medical history significant of CAD s/p CABG 2011, subsequent stents, stroke following ICA stent.  On chronic DAPT.  DM2.  Pt in to ED today after fall this past Friday.  Lightheaded on standing.  Knees gave out.  No LOC.  Today 4 large bloody BMs which was new for him.  In to ED for hematochezia.  Has B lower rib pain, he suspects (correctly) he may have fractured a rib (actually he fractured one on each side).   Review of Systems: As mentioned in the history of present illness. All other systems reviewed and are negative. Past Medical History:  Diagnosis Date   Anxiety    off xanax  and paxil since 3/13   Arthritis    Bilateral carotid artery disease ([place])    s/p R ICA stent 03/28/2015 with distal protection. Known chronically occluded L ICA. Carotid stent complicated by hypotension and acute stroke in [place]   Cataract    COPD (chronic obstructive pulmonary disease) ([place])    Coronary artery disease    a. s/p CABG in 2011 with LIMA-LAD, SVG-RI/OM, and SVG-PDA b. occluded SVG-PDA by cath in 2015 c. 10/2016: NSTEMI with cath showing thrombus along the distal graft to insertion of SVG-OM2 with DES placed.    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)    Skin cancer, basal cell    tumor basal cell rem from lft arm   Smoker    Substance abuse [facility])    Past Surgical History:  Procedure Laterality Date   ABDOMINAL AORTOGRAM W/LOWER EXTREMITY Bilateral 05/31/2019   Procedure: ABDOMINAL AORTOGRAM W/LOWER EXTREMITY;  Surgeon: Waynetta Sandy, MD;  Location: [place] [facility];  Service: Cardiovascular;  Laterality: Bilateral;   AMPUTATION TOE Right 06/02/2019   Procedure: Amputation Right Great Toe and Second Toe;  Surgeon: Waynetta Sandy, MD;  Location: [place];  Service: Vascular;  Laterality: Right;   BACK SURGERY  Jan 2014, Dec 2011   Dr Clydene Fake DILATION N/A 06/13/2020   Procedure: Larrie Kass DILATION;  Surgeon: Gatha Mayer, MD;  Location: [place];  Service: Endoscopy;  Laterality: N/A;   BIOPSY  06/13/2020   Procedure: BIOPSY;  Surgeon: Gatha Mayer, MD;  Location: [facility] ENDOSCOPY;  Service: Endoscopy;;   BYPASS GRAFT FEMORAL-PERONEAL Left 11/29/2019   Procedure: Left Bypass Graft Femoral-Peroneal using non reversed Greater Sapphenous vein;  Surgeon: Waynetta Sandy, MD;  Location: [place];  Service: Vascular;  Laterality: Left;   CARDIAC CATHETERIZATION  4/12   Medical Rx   COLONOSCOPY WITH PROPOFOL N/A 06/13/2020   Procedure: COLONOSCOPY WITH PROPOFOL;  Surgeon: Gatha Mayer,  MD;  Location: [place] ENDOSCOPY;  Service: Endoscopy;  Laterality: N/A;   CORONARY ANGIOGRAM  01/17/14   med rx   CORONARY ANGIOGRAM  4/13   med Rx   CORONARY ANGIOGRAM  1/15   Med Rx   CORONARY ANGIOPLASTY  Jan 2004   RCA   CORONARY ARTERY BYPASS GRAFT  07/24/2009   L-LAD, SVG-RI/OM, SVG-PDA   CORONARY STENT INTERVENTION N/A 10/23/2016   Procedure: Coronary Stent Intervention;  Surgeon: Peter M Martinique, MD;  Location: [place] [facility];  Service: Cardiovascular;  Laterality: N/A;   ENDARTERECTOMY FEMORAL Right 06/02/2019   Procedure: Endarterectomy External Iliac  Femoral Artery and Profunda;  Surgeon: Waynetta Sandy, MD;  Location: [place];  Service: Vascular;  Laterality: Right;   ESOPHAGOGASTRODUODENOSCOPY (EGD) WITH PROPOFOL N/A 06/13/2020   Procedure: ESOPHAGOGASTRODUODENOSCOPY (EGD) WITH PROPOFOL;  Surgeon: Gatha Mayer, MD;  Location: [place];  Service: Endoscopy;  Laterality: N/A;   EYE SURGERY     cat bil   FEMORAL ARTERY - FEMORAL ARTERY BYPASS GRAFT Right 2004   femoral enarterectomy   FEMORAL-POPLITEAL BYPASS GRAFT Right 06/02/2019   Procedure: RIGHT LEG BYPASS GRAFT FEMORAL-POPLITEAL ARTERY using Gore Propaten Vascular Graft Removable Ring;  Surgeon: Waynetta Sandy, MD;  Location: [place];  Service: Vascular;  Laterality: Right;   LEFT HEART CATH AND CORS/GRAFTS ANGIOGRAPHY N/A 10/23/2016   Procedure: Left Heart Cath and Cors/Grafts Angiography;  Surgeon: Peter M Martinique, MD;  Location: [place] [facility];  Service: Cardiovascular;  Laterality: N/A;   LEFT HEART CATHETERIZATION WITH CORONARY ANGIOGRAM N/A 10/03/2011   Procedure: LEFT HEART CATHETERIZATION WITH CORONARY ANGIOGRAM;  Surgeon: Pixie Casino, MD;  Location: [facility] CATH LAB;  Service: Cardiovascular;  Laterality: N/A;   LEFT HEART CATHETERIZATION WITH CORONARY ANGIOGRAM N/A 07/08/2013   Procedure: LEFT HEART CATHETERIZATION WITH CORONARY ANGIOGRAM;  Surgeon: Blane Ohara, MD;  Location: [facility] CATH LAB;  Service: Cardiovascular;  Laterality: N/A;   LEFT HEART CATHETERIZATION WITH CORONARY/GRAFT ANGIOGRAM N/A 01/17/2014   Procedure: LEFT HEART CATHETERIZATION WITH Beatrix Fetters;  Surgeon: Troy Sine, MD;  Location: [facility] CATH LAB;  Service: Cardiovascular;  Laterality: N/A;   LOWER EXTREMITY ANGIOGRAPHY N/A 11/28/2019   Procedure: LOWER EXTREMITY ANGIOGRAPHY;  Surgeon: Waynetta Sandy, MD;  Location: [place] [facility];  Service: Cardiovascular;  Laterality: N/A;   PATCH ANGIOPLASTY Right 06/02/2019   Procedure: Patch Angioplasty using Hemashield Platinum Finesse Patch of the External  Iliac Femoral Artery and Profunda;  Surgeon: Waynetta Sandy, MD;  Location: [place];  Service: Vascular;  Laterality: Right;   PERIPHERAL VASCULAR BALLOON ANGIOPLASTY Right 11/28/2019   Procedure: PERIPHERAL VASCULAR BALLOON ANGIOPLASTY;  Surgeon: Waynetta Sandy, MD;  Location: [place] [facility];  Service: Cardiovascular;  Laterality: Right;  Common femoral   PERIPHERAL VASCULAR CATHETERIZATION N/A 03/28/2015   Procedure: Carotid PTA/Stent Intervention;  Surgeon: Lorretta Harp, MD;  Location: [place] [facility];  Service: Cardiovascular;  Laterality: N/A;   PERIPHERAL VASCULAR INTERVENTION Left 11/28/2019   Procedure: PERIPHERAL VASCULAR INTERVENTION;  Surgeon: Waynetta Sandy, MD;  Location: [place] [facility];  Service: Cardiovascular;  Laterality: Left;  external iliac   POLYPECTOMY  06/13/2020   Procedure: POLYPECTOMY;  Surgeon: Gatha Mayer, MD;  Location: [place];  Service: Endoscopy;;   RIGHT/LEFT HEART CATH AND CORONARY/GRAFT ANGIOGRAPHY N/A 09/01/2020   Procedure: RIGHT/LEFT HEART CATH AND CORONARY/GRAFT ANGIOGRAPHY;  Surgeon: Leonie Man, MD;  Location: [place] [facility];  Service: Cardiovascular;  Laterality:  N/A;   US EXTREMITY*L*     lft arm tumor removed    Social History:  reports that he has been smoking cigarettes. He has a 48.00 pack-year smoking history. He has never used smokeless tobacco. He reports current alcohol use. He reports that he does not currently use drugs.  Allergies  Allergen Reactions   Coreg [Carvedilol] Nausea And Vomiting and Other (See Comments)    Per patient made him dizzy and light sensitive   Sulfa Antibiotics Nausea And Vomiting and Other (See Comments)    Also headaches     Family History  Problem Relation Age of Onset   Arthritis Mother    Heart disease Father     Prior to Admission medications   Medication Sig Start Date End Date Taking? Authorizing Provider  acetaminophen (TYLENOL) 650 MG CR  tablet Take 1,950-2,600 mg by mouth 2 (two) times daily as needed for pain.   Yes [provider]  albuterol (PROVENTIL) (2.5 MG/3ML) 0.083% nebulizer solution Take 3 mLs (2.5 mg total) by nebulization every 6 (six) hours as needed for wheezing or shortness of breath. 04/15/22  Yes Marrian Salvage, FNP  aspirin EC 81 MG EC tablet Take 1 tablet (81 mg total) by mouth daily. Swallow whole. 09/06/20  Yes Clegg, Amy D, NP  clopidogrel (PLAVIX) 75 MG tablet TAKE 1 TABLET BY MOUTH EVERY DAY Patient taking differently: Take 75 mg by mouth once. 03/19/21  Yes Troy Sine, MD  diphenhydrAMINE (BENADRYL) 25 MG tablet Take 75 mg by mouth at bedtime as needed for sleep.   Yes [provider]  docusate sodium (COLACE) 100 MG capsule Take 100 mg by mouth 2 (two) times daily as needed for mild constipation.   Yes [provider]  escitalopram (LEXAPRO) 20 MG tablet TAKE 1 TABLET BY MOUTH EVERY DAY 04/14/22  Yes Marrian Salvage, FNP  gabapentin (NEURONTIN) 300 MG capsule Take 2 capsules (600 mg total) by mouth 2 (two) times daily. 04/15/22  Yes Marrian Salvage, FNP  ibuprofen (ADVIL) 200 MG tablet Take 800 mg by mouth 2 (two) times daily as needed for mild pain.   Yes [provider]  isosorbide mononitrate (IMDUR) 120 MG 24 hr tablet Take 1 tablet (120 mg total) by mouth daily. Schedule an appointment for further refills, final attempt Patient taking differently: Take 120 mg by mouth daily. 11/07/21  Yes Troy Sine, MD  JARDIANCE 10 MG TABS tablet TAKE 1 TABLET EVERY DAY Patient taking differently: Take 10 mg by mouth daily. 08/19/21  Yes Troy Sine, MD  levothyroxine (SYNTHROID) 50 MCG tablet Take 1 tablet (50 mcg total) by mouth daily. 02/06/21  Yes Marrian Salvage, FNP  metFORMIN (GLUCOPHAGE) 500 MG tablet Take 1 tablet (500 mg total) by mouth 2 (two) times daily with a meal. 09/06/20 04/22/22 Yes Clegg, Amy D, NP  nitroGLYCERIN (NITROSTAT)  0.4 MG SL tablet Place 1 tablet (0.4 mg total) under the tongue every 5 (five) minutes as needed for chest pain. 09/03/20  Yes Kroeger, Daleen Snook M., PA-C  OVER THE COUNTER MEDICATION Apply 1 Application topically daily. CBD '3000mg'$  cream for knees.   Yes [provider]  ranolazine (RANEXA) 1000 MG SR tablet TAKE 1 TABLET BY MOUTH TWICE A DAY Patient taking differently: Take 1,000 mg by mouth at bedtime. 04/08/22  Yes Troy Sine, MD  rosuvastatin (CRESTOR) 40 MG tablet Take 1 tablet (40 mg total) by mouth daily. NEED OV. 06/10/21  Yes Claiborne Billings,  Joyice Faster, MD    Physical Exam: Vitals:   04/22/22 1615 04/22/22 1645 04/22/22 1821 04/22/22 1930  BP: (!) 109/59 100/74 (!) 103/57 120/65  Pulse: 75  81 87  Resp: (!) 22 (!) 21 20 (!) 25  Temp: 97.9 F (36.6 C)  97.9 F (36.6 C)   TempSrc: Oral     SpO2: 94%  96% 97%  Weight:      Height:       Constitutional: NAD, calm, comfortable Eyes: PERRL, lids and conjunctivae normal ENMT: Mucous membranes are moist. Posterior pharynx clear of any exudate or lesions.Normal dentition.  Neck: normal, supple, no masses, no thyromegaly Respiratory: clear to auscultation bilaterally, no wheezing, no crackles. Normal respiratory effort. No accessory muscle use.  Cardiovascular: Regular rate and rhythm, no murmurs / rubs / gallops. No extremity edema. 2+ pedal pulses. No carotid bruits.  Abdomen: no tenderness, no masses palpated. No hepatosplenomegaly. Bowel sounds positive.  Musculoskeletal: no clubbing / cyanosis. No joint deformity upper and lower extremities. Good ROM, no contractures. Normal muscle tone.  Skin: no rashes, lesions, ulcers. No induration Neurologic: CN 2-12 grossly intact. Sensation intact, DTR normal. Strength 5/5 in all 4.  Psychiatric: Normal judgment and insight. Alert and oriented x 3. Normal mood.   Data Reviewed:       Latest Ref Rng & Units 04/22/2022   12:45 PM 04/22/2022   12:24 PM 11/02/2020   11:02 AM  CBC  WBC  4.0 - 10.5 K/uL  10.2  8.4   Hemoglobin 13.0 - 17.0 g/dL 9.5  9.8  12.6   Hematocrit 39.0 - 52.0 % 28.0  29.8  37.2   Platelets 150 - 400 K/uL  231  223.0       Latest Ref Rng & Units 04/22/2022   12:45 PM 04/22/2022   12:24 PM 02/05/2021    1:38 PM  CMP  Glucose 70 - 99 mg/dL 195  191  118   BUN 8 - 23 mg/dL '17  17  9   '$ Creatinine 0.61 - 1.24 mg/dL 1.20  1.42  1.29   Sodium 135 - 145 mmol/L 131  133  132   Potassium 3.5 - 5.1 mmol/L 4.3  4.4  4.6   Chloride 98 - 111 mmol/L 107  105  100   CO2 22 - 32 mmol/L  18  22   Calcium 8.9 - 10.3 mg/dL  8.9  9.8   Total Protein 6.5 - 8.1 g/dL  5.5  7.2   Total Bilirubin 0.3 - 1.2 mg/dL  0.6  0.3   Alkaline Phos 38 - 126 U/L  70  83   AST 15 - 41 U/L  14  15   ALT 0 - 44 U/L  13  15    MRI brain shows chronic stroke, nothing acute.  CT CAP: IMPRESSION: 1. Acute minimally displaced fractures of the right seventh and left eighth ribs. No pneumothorax. 2. Small bilateral pleural effusions. 3. No acute traumatic injury within the abdomen or pelvis. 4. Mild diffuse wall thickening of the urinary bladder. Correlate with urinalysis to exclude cystitis. 5. Aortic and coronary artery atherosclerosis (ICD10-I70.0).  Hemoccult positive  Assessment and Plan: * Hematochezia Hematochezia more suggestive of lower bleed, though history of fall followed by rib pain and taking ibuprofen more suspicious for NSAID ulcer. See GI note: PPI Hold DAPT and allow washout tentative plan = EGD / colonoscopy on 11/2 If rapid hematochezia in mean time do GIB CTA and call IR  if positive for embolization.  ABLA (acute blood loss anemia) Secondary to GIB Type and screen Repeat H/H and CBC in AM Transfuse if needed Tele monitor  Rib fractures Incentive spirometry Percocet PRN pain if needed.  CAD, CABG Feb 2011, cath x 4 since-medical Rx Hold DAPT in setting of GIB with ABLA Continue ranexa, looks like he is taking 1g QHS only.  Will put on '500mg'$  BID  dosing since this is a Q12H med.  Hyperlipidemia Continue Statin  HTN (hypertension) Continue Imdur  DM (diabetes mellitus) ([place]) Continue Jardiance Mod scale SSI AC      Advance Care Planning:   Code Status: Full Code  Consults: GI has seen in consult, see their note  Family Communication: No family in room  Severity of Illness: The appropriate patient status for this patient is OBSERVATION. Observation status is judged to be reasonable and necessary in order to provide the required intensity of service to ensure the patient's safety. The patient's presenting symptoms, physical exam findings, and initial radiographic and laboratory data in the context of their medical condition is felt to place them at decreased risk for further clinical deterioration. Furthermore, it is anticipated that the patient will be medically stable for discharge from the hospital within 2 midnights of admission.   Author: Etta Quill., DO 04/22/2022 10:11 PM  For on call review [email or web address].

## 2022-04-22 NOTE — Assessment & Plan Note (Addendum)
Secondary to GIB 1. Type and screen 2. Repeat H/H and CBC in AM 3. Transfuse if needed 4. Tele monitor

## 2022-04-22 NOTE — ED Provider Triage Note (Signed)
Emergency Medicine Provider Triage Evaluation Note  Jason [name] , a 70 y.o. male  was evaluated in triage.  Pt complains of bodily pains and new bloody bowel movements since a fall.  [place] last week on Friday.  States he usually feels lightheaded when eating goes from a sit to a standing position.  Usually pauses when he gets the top, however states someone was at the door and wanted to get there before they left.  You turned quickly and lost consciousness and fell.  Denies hitting head/neck, though unsure.  On Plavix.  Reports mild pain of the left pelvis, moderate pain of the left ribs, and mild left wrist pain.  Today had 4 bloody bowel movements described as dark red, came to the ED for further evaluation.  Denies fever, shortness of breath, chest pain, dizziness, or other injuries since then.  Significant history for HTN, DMT2, CABG x4, CAD, chronic tobacco use, hemispheric carotid artery syndrome, CHF, PAD, prior neoplasms of colon  Review of Systems  Positive:  Negative: See above  Physical Exam  BP (!) 120/52 (BP Location: Left Arm)   Pulse 85   Temp (!) 97.4 F (36.3 C) (Oral)   Resp 18   Ht '5\' 8"'$  (1.727 m)   Wt 70.3 kg   SpO2 97%   BMI 23.57 kg/m  Gen:   Awake, no distress, sitting comfortably Resp:  Normal effort, rhonchi bilaterally, communicates without difficulty, equal chest rise MSK:   Moves extremities without difficulty  Other:  Mild tenderness over the left side of the rib cage.  Chest non-TTP centrally.  Abdomen soft, nondistended.  Mild left hip and left wrist tenderness without obvious bony deformities.  Extremities appear neurovascularly intact.  Gaze aligned appropriately.  Medical Decision Making  Medically screening exam initiated at 12:11 PM.  Appropriate orders placed.  Jason [name] was informed that the remainder of the evaluation will be completed by another provider, this initial triage assessment does not replace that evaluation, and the importance  of remaining in the ED until their evaluation is complete.  Discussed pt with triage nurse, needs to come back to next available room as soon as possible   Jason [name], [place] 31/59/45 1222

## 2022-04-22 NOTE — Assessment & Plan Note (Signed)
Continue Statin 

## 2022-04-22 NOTE — Assessment & Plan Note (Addendum)
Continue Jardiance Mod scale SSI AC

## 2022-04-22 NOTE — ED Provider Notes (Signed)
[facility] EMERGENCY DEPARTMENT Provider Note   CSN: [id number] Arrival date & time: 04/22/22  1155     History  Chief Complaint  Patient presents with   Lytle Michaels    Jason [name] is a 71 y.o. male.   Fall     71 year old male with medical history significant for CAD status post CABG in 2011, HTN, DM 2, GERD, HLD, COPD, depression, substance abuse, carotid artery stenosis status post stent placement with a known chronically occluded left ICA, prior CVA who presents to the emergency department with multiple complaints.  The patient states that he fell this past Friday.  He stated that he got lightheaded upon standing and "my knees gave out on me" but he did not lose consciousness.  Today, he noticed 4 large bloody bowel movements which was new for him.  He endorses generalized fatigue and weakness.  He is on aspirin and Plavix.  He endorses left-sided and right-sided chest discomfort and is worried he may have fractured a rib.  He is not on anticoagulation.  He arrived to the emergency department GCS 15, ABC intact.  Home Medications Prior to Admission medications   Medication Sig Start Date End Date Taking? Authorizing Provider  acetaminophen (TYLENOL) 650 MG CR tablet Take 1,950-2,600 mg by mouth 2 (two) times daily as needed for pain.   Yes [provider]  albuterol (PROVENTIL) (2.5 MG/3ML) 0.083% nebulizer solution Take 3 mLs (2.5 mg total) by nebulization every 6 (six) hours as needed for wheezing or shortness of breath. 04/15/22  Yes Marrian Salvage, FNP  aspirin EC 81 MG EC tablet Take 1 tablet (81 mg total) by mouth daily. Swallow whole. 09/06/20  Yes Clegg, Amy D, NP  clopidogrel (PLAVIX) 75 MG tablet TAKE 1 TABLET BY MOUTH EVERY DAY Patient taking differently: Take 75 mg by mouth once. 03/19/21  Yes Troy Sine, MD  diphenhydrAMINE (BENADRYL) 25 MG tablet Take 75 mg by mouth at bedtime as needed for sleep.   Yes [provider]   docusate sodium (COLACE) 100 MG capsule Take 100 mg by mouth 2 (two) times daily as needed for mild constipation.   Yes [provider]  escitalopram (LEXAPRO) 20 MG tablet TAKE 1 TABLET BY MOUTH EVERY DAY 04/14/22  Yes Marrian Salvage, FNP  gabapentin (NEURONTIN) 300 MG capsule Take 2 capsules (600 mg total) by mouth 2 (two) times daily. 04/15/22  Yes Marrian Salvage, FNP  ibuprofen (ADVIL) 200 MG tablet Take 800 mg by mouth 2 (two) times daily as needed for mild pain.   Yes [provider]  isosorbide mononitrate (IMDUR) 120 MG 24 hr tablet Take 1 tablet (120 mg total) by mouth daily. Schedule an appointment for further refills, final attempt Patient taking differently: Take 120 mg by mouth daily. 11/07/21  Yes Troy Sine, MD  JARDIANCE 10 MG TABS tablet TAKE 1 TABLET EVERY DAY Patient taking differently: Take 10 mg by mouth daily. 08/19/21  Yes Troy Sine, MD  levothyroxine (SYNTHROID) 50 MCG tablet Take 1 tablet (50 mcg total) by mouth daily. 02/06/21  Yes Marrian Salvage, FNP  metFORMIN (GLUCOPHAGE) 500 MG tablet Take 1 tablet (500 mg total) by mouth 2 (two) times daily with a meal. 09/06/20 04/22/22 Yes Clegg, Amy D, NP  nitroGLYCERIN (NITROSTAT) 0.4 MG SL tablet Place 1 tablet (0.4 mg total) under the tongue every 5 (five) minutes as needed for chest pain. 09/03/20  Yes Kroeger, Lorelee Cover., PA-C  OVER THE COUNTER MEDICATION Apply 1 Application topically daily. CBD '3000mg'$  cream for knees.   Yes [provider]  ranolazine (RANEXA) 1000 MG SR tablet TAKE 1 TABLET BY MOUTH TWICE A DAY Patient taking differently: Take 1,000 mg by mouth at bedtime. 04/08/22  Yes Troy Sine, MD  rosuvastatin (CRESTOR) 40 MG tablet Take 1 tablet (40 mg total) by mouth daily. NEED OV. 06/10/21  Yes Troy Sine, MD      Allergies    Coreg [carvedilol] and Sulfa antibiotics    Review of Systems   Review of Systems  All other systems reviewed and are  negative.   Physical Exam Updated Vital Signs BP 120/65   Pulse 87   Temp 97.9 F (36.6 C)   Resp (!) 25   Ht '5\' 8"'$  (1.727 m)   Wt 70.3 kg   SpO2 97%   BMI 23.57 kg/m  Physical Exam Vitals and nursing note reviewed. Exam conducted with a chaperone present.  Constitutional:      Appearance: He is well-developed.     Comments: GCS 15, ABC intact  HENT:     Head: Normocephalic.  Eyes:     Conjunctiva/sclera: Conjunctivae normal.  Neck:     Comments: No midline tenderness to palpation of the cervical spine. ROM intact. Cardiovascular:     Rate and Rhythm: Normal rate and regular rhythm.  Pulmonary:     Effort: Pulmonary effort is normal. No respiratory distress.     Breath sounds: Normal breath sounds.  Chest:     Comments: Chest wall with bilateral tenderness to palpation. Clavicles stable and non-tender to AP compression Abdominal:     Palpations: Abdomen is soft.     Tenderness: There is no abdominal tenderness.     Comments: Pelvis stable to lateral compression.  Genitourinary:    Rectum: Guaiac result positive.     Comments: Small mount of bright red blood per rectum Musculoskeletal:     Cervical back: Neck supple.     Comments: No midline tenderness to palpation of the thoracic or lumbar spine. Extremities atraumatic with intact ROM.   Skin:    General: Skin is warm and dry.  Neurological:     Mental Status: He is alert.     Comments: CN II-XII grossly intact. Moving all four extremities spontaneously and sensation grossly intact.     ED Results / Procedures / Treatments   Labs (all labs ordered are listed, but only abnormal results are displayed) Labs Reviewed  COMPREHENSIVE METABOLIC PANEL - Abnormal; Notable for the following components:      Result Value   Sodium 133 (*)    CO2 18 (*)    Glucose, Bld 191 (*)    Creatinine, Ser 1.42 (*)    Total Protein 5.5 (*)    Albumin 3.0 (*)    AST 14 (*)    GFR, Estimated 53 (*)    All other components  within normal limits  CBC WITH DIFFERENTIAL/PLATELET - Abnormal; Notable for the following components:   RBC 2.91 (*)    Hemoglobin 9.8 (*)    HCT 29.8 (*)    MCV 102.4 (*)    Abs Immature Granulocytes 0.15 (*)    All other components within normal limits  POC OCCULT BLOOD, ED - Abnormal; Notable for the following components:   Fecal Occult Bld POSITIVE (*)    All other components within normal limits  I-STAT CHEM 8, ED - Abnormal; Notable for the following components:  Sodium 131 (*)    Glucose, Bld 195 (*)    Calcium, Ion 0.92 (*)    TCO2 17 (*)    Hemoglobin 9.5 (*)    HCT 28.0 (*)    All other components within normal limits  ETHANOL  URINALYSIS, ROUTINE W REFLEX MICROSCOPIC  HIV ANTIBODY (ROUTINE TESTING W REFLEX)  CBC  BASIC METABOLIC PANEL  TYPE AND SCREEN    EKG EKG Interpretation  Date/Time:  Tuesday April 22 2022 15:49:22 EDT Ventricular Rate:  81 PR Interval:  145 QRS Duration: 171 QT Interval:  466 QTC Calculation: 541 R Axis:   175 Text Interpretation: Sinus rhythm RBBB and LPFB Confirmed by Regan Lemming ([id number]) on 04/22/2022 9:32:28 PM  Radiology MR BRAIN WO CONTRAST  Result Date: 04/22/2022 CLINICAL DATA:  Fall last week, lightheadedness. EXAM: MRI HEAD WITHOUT CONTRAST TECHNIQUE: Multiplanar, multiecho pulse sequences of the brain and surrounding structures were obtained without intravenous contrast. COMPARISON:  Same-day noncontrast CT head FINDINGS: Brain: There is no acute intracranial hemorrhage, extra-axial fluid collection, or acute infarct There is moderate background parenchymal volume loss with prominence of the ventricular system and extra-axial CSF spaces. There is a small remote infarct in the right parietal white matter extending to the cortex which is evolved since 2016. A small remote lacunar infarct in the left cerebellar hemisphere is new since 2016. A small area of encephalomalacia in the left insular region was present in 2016. There is  a tiny cortical infarct in the right precentral gyrus, unchanged since 2016. There is a remote cortical infarct in the left occipital lobe, accounting for the finding on the CT head from 1 day priorand unchanged since the MRI from 2016. There is patchy FLAIR signal abnormality in the supratentorial white matter likely reflecting moderate chronic small vessel ischemic change, overall progressed since 2016. No chronic blood products are seen. There is no mass lesion.  There is no mass effect or midline shift. Vascular: The left high cervical and intracranial ICA flow void is absent, unchanged since 2016 and likely occluded. The MCA and ACA flow voids are normal. The other major flow voids are normal. Skull and upper cervical spine: Normal marrow signal. Sinuses/Orbits: There is complete opacification of the sphenoid sinuses with findings consistent with chronic sinusitis on the prior CT. Bilateral lens implants are in place. The globes and orbits are otherwise unremarkable. Other: There are trace mastoid effusions. IMPRESSION: 1. No acute intracranial pathology. 2. Remote infarct in the left occipital lobe accounting for the finding on the head CT from 1 day prior. 3. Additional small remote infarcts and background chronic small-vessel ischemic changes are overall progressed since 2016 4. Absent left ICA flow void, likely occluded and unchanged since 2016 Electronically Signed   By: Valetta Mole M.D.   On: 04/22/2022 18:33   CT CHEST ABDOMEN PELVIS W CONTRAST  Result Date: 04/22/2022 CLINICAL DATA:  Chest wall pain. Polytrauma, blunt [id number] Trauma [phone] EXAM: CT CHEST, ABDOMEN, AND PELVIS WITH CONTRAST TECHNIQUE: Multidetector CT imaging of the chest, abdomen and pelvis was performed following the standard protocol during bolus administration of intravenous contrast. RADIATION DOSE REDUCTION: This exam was performed according to the departmental dose-optimization program which includes automated exposure  control, adjustment of the mA and/or kV according to patient size and/or use of iterative reconstruction technique. CONTRAST:  39m OMNIPAQUE IOHEXOL 350 MG/ML SOLN COMPARISON:  CT abdomen pelvis 06/08/2020 FINDINGS: CT CHEST FINDINGS Cardiovascular: Heart size within normal limits. No pericardial effusion. Prior sternotomy and CABG.  Thoracic aorta is nonaneurysmal. Atherosclerotic calcification of the aorta and coronary arteries. Central pulmonary vasculature is within normal limits. Mediastinum/Nodes: No mediastinal hematoma. No enlarged mediastinal, hilar, or axillary lymph nodes. Thyroid gland, trachea, and esophagus demonstrate no significant findings. Lungs/Pleura: Small bilateral pleural effusions. No airspace consolidation. No pneumothorax. Musculoskeletal: Acute minimally displaced fracture of the anterolateral right seventh rib. Acute minimally displaced fracture of the lateral left eighth rib. Thoracic vertebral body heights are maintained without evidence of fracture. Chronic fractures of the right eighth, tenth, and eleventh ribs. CT ABDOMEN PELVIS FINDINGS Hepatobiliary: No hepatic injury or perihepatic hematoma. Gallbladder is unremarkable. Pancreas: Unremarkable. No pancreatic ductal dilatation or surrounding inflammatory changes. Spleen: Normal in size without focal abnormality. Adrenals/Urinary Tract: No adrenal hemorrhage or renal injury identified. Urinary bladder is moderately distended with mild diffuse wall thickening. Stomach/Bowel: Stomach is within normal limits. Appendix appears normal. No evidence of bowel wall thickening, distention, or inflammatory changes. Vascular/Lymphatic: Aortic atherosclerosis. No enlarged abdominal or pelvic lymph nodes. Reproductive: Prostate is unremarkable. Other: No free fluid. No abdominopelvic fluid collection. No pneumoperitoneum. No abdominal wall hernia. Musculoskeletal: No acute or significant osseous findings. Prior L4-S1 fusion. Advanced multilevel  degenerative disc disease of the lumbar spine. IMPRESSION: 1. Acute minimally displaced fractures of the right seventh and left eighth ribs. No pneumothorax. 2. Small bilateral pleural effusions. 3. No acute traumatic injury within the abdomen or pelvis. 4. Mild diffuse wall thickening of the urinary bladder. Correlate with urinalysis to exclude cystitis. 5. Aortic and coronary artery atherosclerosis (ICD10-I70.0). Electronically Signed   By: Davina Poke D.O.   On: 04/22/2022 14:01   CT HEAD WO CONTRAST  Result Date: 04/22/2022 CLINICAL DATA:  Trauma EXAM: CT HEAD WITHOUT CONTRAST CT CERVICAL SPINE WITHOUT CONTRAST TECHNIQUE: Multidetector CT imaging of the head and cervical spine was performed following the standard protocol without intravenous contrast. Multiplanar CT image reconstructions of the cervical spine were also generated. RADIATION DOSE REDUCTION: This exam was performed according to the departmental dose-optimization program which includes automated exposure control, adjustment of the mA and/or kV according to patient size and/or use of iterative reconstruction technique. COMPARISON:  MRI Brain 04/04/2015 FINDINGS: CT HEAD FINDINGS Brain: No hemorrhage. No extra-axial fluid collection. There is sequela of severe chronic microvascular ischemic change with likely subacute, but technically age indeterminate infarcts in the left occipital lobe (series 3, image 15). There is ventriculomegaly, which is likely proportional to the degree of volume loss, which is advanced. Vascular: No hyperdense vessel or unexpected calcification. Skull: Normal. Negative for fracture or focal lesion. Sinuses/Orbits: No acute finding. Other: None. CT CERVICAL SPINE FINDINGS Alignment: There is straightening of the normal cervical lordosis. Skull base and vertebrae: No acute fracture. No primary bone lesion or focal pathologic process. Soft tissues and spinal canal: No prevertebral fluid or swelling. No visible canal  hematoma. Right common carotid artery vascular stent place. Disc levels: There are multilevel degenerative changes with calcified disc bulges at the C3-C4, C4-C5, and C5-C6 level. There is likely moderate to severe spinal canal stenosis at the C4-C5 and C5-C6 vertebral body levels Upper chest: Negative. Other: None IMPRESSION: 1. No CT evidence of intracranial injury. 2. Possibly subacute, but technically age indeterminate infarct in the left occipital lobe. Recommend MRI for further evaluation. 3. No acute cervical spine fracture. Multilevel degenerative changes with likely moderate to severe spinal canal stenosis at the C4-C5 and C5-C6 vertebral body levels. Electronically Signed   By: Marin Roberts M.D.   On: 04/22/2022 13:49   CT  CERVICAL SPINE WO CONTRAST  Result Date: 04/22/2022 CLINICAL DATA:  Trauma EXAM: CT HEAD WITHOUT CONTRAST CT CERVICAL SPINE WITHOUT CONTRAST TECHNIQUE: Multidetector CT imaging of the head and cervical spine was performed following the standard protocol without intravenous contrast. Multiplanar CT image reconstructions of the cervical spine were also generated. RADIATION DOSE REDUCTION: This exam was performed according to the departmental dose-optimization program which includes automated exposure control, adjustment of the mA and/or kV according to patient size and/or use of iterative reconstruction technique. COMPARISON:  MRI Brain 04/04/2015 FINDINGS: CT HEAD FINDINGS Brain: No hemorrhage. No extra-axial fluid collection. There is sequela of severe chronic microvascular ischemic change with likely subacute, but technically age indeterminate infarcts in the left occipital lobe (series 3, image 15). There is ventriculomegaly, which is likely proportional to the degree of volume loss, which is advanced. Vascular: No hyperdense vessel or unexpected calcification. Skull: Normal. Negative for fracture or focal lesion. Sinuses/Orbits: No acute finding. Other: None. CT CERVICAL SPINE  FINDINGS Alignment: There is straightening of the normal cervical lordosis. Skull base and vertebrae: No acute fracture. No primary bone lesion or focal pathologic process. Soft tissues and spinal canal: No prevertebral fluid or swelling. No visible canal hematoma. Right common carotid artery vascular stent place. Disc levels: There are multilevel degenerative changes with calcified disc bulges at the C3-C4, C4-C5, and C5-C6 level. There is likely moderate to severe spinal canal stenosis at the C4-C5 and C5-C6 vertebral body levels Upper chest: Negative. Other: None IMPRESSION: 1. No CT evidence of intracranial injury. 2. Possibly subacute, but technically age indeterminate infarct in the left occipital lobe. Recommend MRI for further evaluation. 3. No acute cervical spine fracture. Multilevel degenerative changes with likely moderate to severe spinal canal stenosis at the C4-C5 and C5-C6 vertebral body levels. Electronically Signed   By: Marin Roberts M.D.   On: 04/22/2022 13:49   DG Chest 2 View  Result Date: 04/22/2022 CLINICAL DATA:  Chest wall pain since fall a few days ago. EXAM: CHEST - 2 VIEW COMPARISON:  Chest x-ray dated September 01, 2020. FINDINGS: The heart size and mediastinal contours are within normal limits. Prior CABG. Both lungs are clear. The visualized skeletal structures are unremarkable. IMPRESSION: 1. No acute cardiopulmonary disease. Electronically Signed   By: Titus Dubin M.D.   On: 04/22/2022 12:46    Procedures Procedures    Medications Ordered in ED Medications  pantoprazole (PROTONIX) EC tablet 40 mg (has no administration in time range)  LORazepam (ATIVAN) tablet 1 mg (1 mg Oral Not Given 04/22/22 1801)  LORazepam (ATIVAN) injection 0.5 mg (has no administration in time range)  acetaminophen (TYLENOL) tablet 650 mg (has no administration in time range)    Or  acetaminophen (TYLENOL) suppository 650 mg (has no administration in time range)  ondansetron (ZOFRAN) tablet  4 mg (has no administration in time range)    Or  ondansetron (ZOFRAN) injection 4 mg (has no administration in time range)  iohexol (OMNIPAQUE) 350 MG/ML injection 70 mL (70 mLs Intravenous Contrast Given 04/22/22 1334)  fentaNYL (SUBLIMAZE) injection 50 mcg (50 mcg Intravenous Given 04/22/22 1619)    ED Course/ Medical Decision Making/ A&P                           Medical Decision Making Amount and/or Complexity of Data Reviewed Radiology: ordered.  Risk Prescription drug management. Decision regarding hospitalization.   71 year old male with medical history significant for CAD status post CABG  in 2011, HTN, DM 2, GERD, HLD, COPD, depression, substance abuse, carotid artery stenosis status post stent placement with a known chronically occluded left ICA, prior CVA who presents to the emergency department with multiple complaints.  The patient states that he fell this past Friday.  He stated that he got lightheaded upon standing and "my knees gave out on me" but he did not lose consciousness.  Today, he noticed 4 large bloody bowel movements which was new for him.  He endorses generalized fatigue and weakness.  He is on aspirin and Plavix.  He endorses left-sided and right-sided chest discomfort and is worried he may have fractured a rib.  He endorses some tenderness in his left hip.  He is not on anticoagulation.  He has been ambulatory since the fall.  He arrived to the emergency department GCS 15, ABC intact.  On arrival, the patient was vitally stable.  Afebrile, not tachycardic or tachypneic, BP 120/52, saturating 92 to 95% on room air.  Sinus rhythm noted on cardiac telemetry.  EKG revealed bundle-branch blocks, no acute ischemic changes noted, ventricular rate 81.  Rectal exam was performed which revealed bright red blood.  No hemorrhoids. No Melena.  Concern for lower GI bleed in the setting of the patient's anticoagulation.  Additional concern for traumatic injury with concern for  potential rib fractures given the patient's tenderness on exam.  Concern for intracranial injury.  The patient is neurologically intact without deficit.  Work-up significant for CMP with serum creatinine of 1.42, non-anion gap acidosis with a bicarbonate of 18, anion gap of 10, CBC without a leukocytosis, anemia to 9.8, decreased from measurements 1 year ago it was measured at 12.6.  Imaging evaluation significant for CT head and cervical spine: IMPRESSION:  1. No CT evidence of intracranial injury.  2. Possibly subacute, but technically age indeterminate infarct in  the left occipital lobe. Recommend MRI for further evaluation.  3. No acute cervical spine fracture. Multilevel degenerative changes  with likely moderate to severe spinal canal stenosis at the C4-C5  and C5-C6 vertebral body levels.    CT chest abdomen pelvis: IMPRESSION:  1. Acute minimally displaced fractures of the right seventh and left  eighth ribs. No pneumothorax.  2. Small bilateral pleural effusions.  3. No acute traumatic injury within the abdomen or pelvis.  4. Mild diffuse wall thickening of the urinary bladder. Correlate  with urinalysis to exclude cystitis.  5. Aortic and coronary artery atherosclerosis (ICD10-I70.0).    MRI of the brain: IMPRESSION:  1. No acute intracranial pathology.  2. Remote infarct in the left occipital lobe accounting for the  finding on the head CT from 1 day prior.  3. Additional small remote infarcts and background chronic  small-vessel ischemic changes are overall progressed since 2016  4. Absent left ICA flow void, likely occluded and unchanged since  2016   I did discuss the patient's case with Dr. Lyndel Safe of on-call gastroenterology.  Patient has painless hematochezia.  Clinically, the it was thought that he was having a diverticular bleed.  Plan for EGD inpatient, repeat colonoscopy, likely for 11/2.  Protonix 40 mg daily was added, did not feel that he needed a PPI  gtt.  Hospitalist medicine was consulted for admission for further evaluation.  Dr. Alcario Drought of hospitalist medicine subsequently accepted the patient in admission.   Final Clinical Impression(s) / ED Diagnoses Final diagnoses:  Closed fracture of multiple ribs of both sides, initial encounter  Lower GI bleed  Fall,  initial encounter    Rx / DC Orders ED Discharge Orders     None         Regan Lemming, MD 04/22/22 2153

## 2022-04-22 NOTE — Assessment & Plan Note (Addendum)
Hold DAPT in setting of GIB with ABLA Continue ranexa, looks like he is taking 1g QHS only.  Will put on '500mg'$  BID dosing since this is a Q12H med.

## 2022-04-22 NOTE — Consult Note (Addendum)
[facility] Consult: 4:12 PM 04/22/2022  LOS: 0 days    Referring Provider: Dr Armandina Gemma in ED  Primary Care Physician:  Marrian Salvage, [place] Primary Gastroenterologist:  unassigned.  2021 inpatient evaluation by [facility].      Reason for Consultation:  Painless Hematochezia.     HPI: Jason [name] is a 71 y.o. male.  PMH CAD.  BID.  Previous CABG 2011.  Cardiac stenting at time of non-STEMI in 2018. CVA several days following ICA stenting.  Chronic Plavix.  DM2.  PAD.  Previous right femoral artery bypass then femoral popliteal bypass.  Peripheral vascular angioplasties.  Status post partial toe amputations.  05/2020 EGD.  Inpatient evaluation for N, V, dysphagia, abdominal pain, GER symptoms. Mild Schatzki ring was dilated.  Hill grade 3 GE flap.  Gastric and examined duodenal mucosa unremarkable, duodenal biopsies obtained Duodenal pathology benign,iliac sprue.   05/2020 colonoscopy.  For evaluation diarrhea.   10 diminutive polyps removed from throughout the colon.  Colonic mucosa otherwise normal.  Nonbleeding internal hemorrhoids. Random colon biopsies with benign lymphoid aggregate, no microscopic colitis.  Polyp pathology were TA's without high-grade dysplasia.  Over the course of the last 3 to 4 weeks patient's had progressive weakness and noticing tachycardia.  Normally has brown stools every day sometimes might skip a day.  Denies dysphagia, reflux symptoms, anorexia, nausea.  Takes Ibuprofen consisting of 800 mg once to twice daily at least 3 to 4 days a week. On Saturday evening the patient was weak and fell to the ground.  He did not have a syncopal spell.  He had to crawl to the door to find a neighbor.  When he got up he was dizzy. Patient's last bowel movement was probably on Friday.   Then he had a total of 4 episodes of moderate to large volume hematochezia over the course of an hour and a half this morning.  Denies previous minor bleeding per rectum or any GI bleeding at all. He took his Plavix this morning.  Last dose of aspirin was yesterday.  Hgb 9.5.  It was 12.6 in May 2022.  MCV 102.  Platelets, WBCs normal. Na 131.  Glucose 195.  BUNs/creatinine, LFTs normal. CT chest, abdomen, pelvis with contrast: Fractures of right seventh and left eighth ribs.  No PTX.  Small, bilateral pleural effusions.  No evidence for trauma injury in the abdomen or pelvis.  Mild diffuse wall thickening at urinary bladder correlate with urinalysis to exclude cystitis.  Aortic and coronary artery atherosclerosis.  Advanced multilevel degenerative disc disease in lumbar spine. CT head.  No evidence for injury.  Possibly subacute left occipital lobe infarct, MRI recommended.  Multilevel cervical spine stenosis and degenerative changes. MR brain ordered.  Family history significant for ulcer disease in his father.  There is no family history of liver, pancreas disease or gastric/intestinal cancers. Patient smokes about a pack a day.  Does not consume alcohol for many years and no history of heavy alcohol use.  He lives alone in [place].  He has  a daughter and grandchild in [place].  Past Medical History:  Diagnosis Date   Anxiety    off xanax  and paxil since 3/13   Arthritis    Bilateral carotid artery disease ([place])    s/p R ICA stent 03/28/2015 with distal protection. Known chronically occluded L ICA. Carotid stent complicated by hypotension and acute stroke in [place]   Cataract    COPD (chronic obstructive pulmonary disease) ([place])    Coronary artery disease    a. s/p CABG in 2011 with LIMA-LAD, SVG-RI/OM, and SVG-PDA b. occluded SVG-PDA by cath in 2015 c. 10/2016: NSTEMI with cath showing thrombus along the distal graft to insertion of SVG-OM2 with DES placed.     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)    Skin cancer, basal cell    tumor basal cell rem from lft arm   Smoker    Substance abuse [facility])     Past Surgical History:  Procedure Laterality Date   ABDOMINAL AORTOGRAM W/LOWER EXTREMITY Bilateral 05/31/2019   Procedure: ABDOMINAL AORTOGRAM W/LOWER EXTREMITY;  Surgeon: Waynetta Sandy, MD;  Location: [place] [facility];  Service: Cardiovascular;  Laterality: Bilateral;   AMPUTATION TOE Right 06/02/2019   Procedure: Amputation Right Great Toe and Second Toe;  Surgeon: Waynetta Sandy, MD;  Location: [place];  Service: Vascular;  Laterality: Right;   BACK SURGERY  Jan 2014, Dec 2011   Dr Clydene Fake DILATION N/A 06/13/2020   Procedure: Larrie Kass DILATION;  Surgeon: Gatha Mayer, MD;  Location: [place];  Service: Endoscopy;  Laterality: N/A;   BIOPSY  06/13/2020   Procedure: BIOPSY;  Surgeon: Gatha Mayer, MD;  Location: [facility] ENDOSCOPY;  Service: Endoscopy;;   BYPASS GRAFT FEMORAL-PERONEAL Left 11/29/2019   Procedure: Left Bypass Graft Femoral-Peroneal using non reversed Greater Sapphenous vein;  Surgeon: Waynetta Sandy, MD;  Location: [place];  Service: Vascular;  Laterality: Left;   CARDIAC CATHETERIZATION  4/12   Medical Rx   COLONOSCOPY WITH PROPOFOL N/A 06/13/2020   Procedure: COLONOSCOPY WITH PROPOFOL;  Surgeon: Gatha Mayer, MD;  Location: [place];  Service: Endoscopy;  Laterality: N/A;   CORONARY ANGIOGRAM  01/17/14   med rx   CORONARY ANGIOGRAM  4/13   med Rx   CORONARY ANGIOGRAM   1/15   Med Rx   CORONARY ANGIOPLASTY  Jan 2004   RCA   CORONARY ARTERY BYPASS GRAFT  07/24/2009   L-LAD, SVG-RI/OM, SVG-PDA   CORONARY STENT INTERVENTION N/A 10/23/2016   Procedure: Coronary Stent Intervention;  Surgeon: Peter M Martinique, MD;  Location: [place] [facility];  Service: Cardiovascular;  Laterality: N/A;   ENDARTERECTOMY FEMORAL Right 06/02/2019   Procedure: Endarterectomy External Iliac  Femoral Artery and Profunda;  Surgeon: Waynetta Sandy, MD;  Location: [place];  Service: Vascular;  Laterality: Right;   ESOPHAGOGASTRODUODENOSCOPY (EGD) WITH PROPOFOL N/A 06/13/2020   Procedure: ESOPHAGOGASTRODUODENOSCOPY (EGD) WITH PROPOFOL;  Surgeon: Gatha Mayer, MD;  Location: [facility] ENDOSCOPY;  Service: Endoscopy;  Laterality: N/A;   EYE SURGERY     cat bil   FEMORAL ARTERY - FEMORAL ARTERY BYPASS GRAFT Right 2004   femoral enarterectomy   FEMORAL-POPLITEAL BYPASS GRAFT Right 06/02/2019   Procedure: RIGHT LEG BYPASS GRAFT FEMORAL-POPLITEAL ARTERY using Gore Propaten Vascular Graft Removable Ring;  Surgeon: Waynetta Sandy, MD;  Location: [place];  Service: Vascular;  Laterality: Right;   LEFT HEART CATH AND CORS/GRAFTS ANGIOGRAPHY N/A 10/23/2016   Procedure: Left Heart Cath and Cors/Grafts Angiography;  Surgeon: Peter M Martinique, MD;  Location: [place] [facility];  Service: Cardiovascular;  Laterality: N/A;   LEFT HEART CATHETERIZATION WITH CORONARY ANGIOGRAM N/A 10/03/2011   Procedure: LEFT HEART CATHETERIZATION WITH CORONARY ANGIOGRAM;  Surgeon: Pixie Casino, MD;  Location: [facility] CATH LAB;  Service: Cardiovascular;  Laterality: N/A;   LEFT HEART CATHETERIZATION WITH CORONARY ANGIOGRAM N/A 07/08/2013   Procedure: LEFT HEART CATHETERIZATION WITH CORONARY ANGIOGRAM;  Surgeon: Blane Ohara, MD;  Location: [facility] CATH LAB;  Service: Cardiovascular;  Laterality: N/A;   LEFT HEART CATHETERIZATION WITH CORONARY/GRAFT ANGIOGRAM N/A 01/17/2014   Procedure: LEFT HEART CATHETERIZATION WITH  Beatrix Fetters;  Surgeon: Troy Sine, MD;  Location: [facility] C[facility] LAB;  Service: Cardiovascular;  Laterality: N/A;   LOWER EXTREMITY ANGIOGRAPHY N/A 11/28/2019   Procedure: LOWER EXTREMITY ANGIOGRAPHY;  Surgeon: Waynetta Sandy, MD;  Location: [place] [facility];  Service: Cardiovascular;  Laterality: N/A;   PATCH ANGIOPLASTY Right 06/02/2019   Procedure: Patch Angioplasty using Hemashield Platinum Finesse Patch of the External Iliac Femoral Artery and Profunda;  Surgeon: Waynetta Sandy, MD;  Location: [place];  Service: Vascular;  Laterality: Right;   PERIPHERAL VASCULAR BALLOON ANGIOPLASTY Right 11/28/2019   Procedure: PERIPHERAL VASCULAR BALLOON ANGIOPLASTY;  Surgeon: Waynetta Sandy, MD;  Location: [place] [facility];  Service: Cardiovascular;  Laterality: Right;  Common femoral   PERIPHERAL VASCULAR CATHETERIZATION N/A 03/28/2015   Procedure: Carotid PTA/Stent Intervention;  Surgeon: Lorretta Harp, MD;  Location: [place] [facility];  Service: Cardiovascular;  Laterality: N/A;   PERIPHERAL VASCULAR INTERVENTION Left 11/28/2019   Procedure: PERIPHERAL VASCULAR INTERVENTION;  Surgeon: Waynetta Sandy, MD;  Location: [place] [facility];  Service: Cardiovascular;  Laterality: Left;  external iliac   POLYPECTOMY  06/13/2020   Procedure: POLYPECTOMY;  Surgeon: Gatha Mayer, MD;  Location: [place];  Service: Endoscopy;;   RIGHT/LEFT HEART CATH AND CORONARY/GRAFT ANGIOGRAPHY N/A 09/01/2020   Procedure: RIGHT/LEFT HEART CATH AND CORONARY/GRAFT ANGIOGRAPHY;  Surgeon: Leonie Man, MD;  Location: [place] [facility];  Service: Cardiovascular;  Laterality: N/A;   US EXTREMITY*L*     lft arm tumor removed     Prior to Admission medications   Medication Sig Start Date End Date Taking? Authorizing Provider  acetaminophen (TYLENOL) 650 MG CR tablet Take 1,950-2,600 mg by mouth 2 (two) times daily as needed for pain.    [provider]   albuterol (PROVENTIL) (2.5 MG/3ML) 0.083% nebulizer solution Take 3 mLs (2.5 mg total) by nebulization every 6 (six) hours as needed for wheezing or shortness of breath. 04/15/22   Marrian Salvage, FNP  aspirin EC 81 MG EC tablet Take 1 tablet (81 mg total) by mouth daily. Swallow whole. 09/06/20   Clegg, Amy D, NP  bisoprolol (ZEBETA) 5 MG tablet Take 0.5 tablets (2.5 mg total) by mouth at bedtime. 09/06/20   Clegg, Amy D, NP  clopidogrel (PLAVIX) 75 MG tablet TAKE 1 TABLET  BY MOUTH EVERY DAY 03/19/21   Troy Sine, MD  diphenhydrAMINE (BENADRYL) 25 MG tablet Take 75 mg by mouth at bedtime as needed for sleep.    [provider]  docusate sodium (COLACE) 100 MG capsule Take 100 mg by mouth 2 (two) times daily.    [provider]  escitalopram (LEXAPRO) 20 MG tablet TAKE 1 TABLET BY MOUTH EVERY DAY 04/14/22   Marrian Salvage, FNP  ferrous sulfate 325 (65 FE) MG tablet Take 1 tablet (325 mg total) by mouth daily with breakfast. 12/08/19   Hosie Poisson, MD  gabapentin (NEURONTIN) 300 MG capsule Take 2 capsules (600 mg total) by mouth 2 (two) times daily. 04/15/22   Marrian Salvage, FNP  ibuprofen (ADVIL) 200 MG tablet Take 800 mg by mouth 2 (two) times daily as needed for mild pain.    [provider]  isosorbide mononitrate (IMDUR) 120 MG 24 hr tablet Take 1 tablet (120 mg total) by mouth daily. Schedule an appointment for further refills, final attempt 11/07/21   Troy Sine, MD  JARDIANCE 10 MG TABS tablet TAKE 1 TABLET EVERY DAY 08/19/21   Troy Sine, MD  levothyroxine (SYNTHROID) 50 MCG tablet Take 1 tablet (50 mcg total) by mouth daily. 02/06/21   Marrian Salvage, FNP  loratadine (CLARITIN) 10 MG tablet Take 10 mg by mouth daily.    [provider]  metFORMIN (GLUCOPHAGE) 500 MG tablet Take 1 tablet (500 mg total) by mouth 2 (two) times daily with a meal. 09/06/20 09/06/21  Clegg, Amy D, NP  nitroGLYCERIN (NITROSTAT) 0.4 MG SL  tablet Place 1 tablet (0.4 mg total) under the tongue every 5 (five) minutes as needed for chest pain. 09/03/20   Kroeger, Lorelee Cover., PA-C  ranolazine (RANEXA) 1000 MG SR tablet TAKE 1 TABLET BY MOUTH TWICE A DAY 04/08/22   Troy Sine, MD  rosuvastatin (CRESTOR) 40 MG tablet Take 1 tablet (40 mg total) by mouth daily. NEED OV. 06/10/21   Troy Sine, MD  spironolactone (ALDACTONE) 25 MG tablet Take 0.5 tablets (12.5 mg total) by mouth daily. 09/06/20   Darrick Grinder D, NP    Scheduled Meds:  fentaNYL (SUBLIMAZE) injection  50 mcg Intravenous Once   Infusions:  PRN Meds:    Allergies as of 04/22/2022 - Review Complete 04/22/2022  Allergen Reaction Noted   Coreg [carvedilol] Nausea And Vomiting and Other (See Comments) 07/26/2013   Sulfa antibiotics Nausea And Vomiting and Other (See Comments) 10/02/2011    Family History  Problem Relation Age of Onset   Arthritis Mother    Heart disease Father     Social History   Socioeconomic History   Marital status: Single    Spouse name: Not on file   Number of children: 1   Years of education: 14   Highest education level: Not on file  Occupational History   Occupation: welder-fabricator  Tobacco Use   Smoking status: Every Day    Packs/day: 1.00    Years: 48.00    Total pack years: 48.00    Types: Cigarettes   Smokeless tobacco: Never  Vaping Use   Vaping Use: Never used  Substance and Sexual Activity   Alcohol use: Yes    Alcohol/week: 0.0 standard drinks of alcohol    Comment: socially   Drug use: Not Currently   Sexual activity: Not on file  Other Topics Concern   Not on file  Social History Narrative   Lives  alone   Caffeine use: Drinks pepsi/coffee (32 oz diet pepsi per day)    Exercise walking 4 times per week for 1 mile   Current PPD smoker.    Social Determinants of Health   Financial Resource Strain: Medium Risk (09/06/2020)   Overall Financial Resource Strain (CARDIA)    Difficulty of Paying Living  Expenses: Somewhat hard  Food Insecurity: No Food Insecurity (09/06/2020)   Hunger Vital Sign    Worried About Running Out of Food in the Last Year: Never true    Ran Out of Food in the Last Year: Never true  Transportation Needs: No Transportation Needs (09/06/2020)   PRAPARE - Hydrologist (Medical): No    Lack of Transportation (Non-Medical): No  Physical Activity: Not on file  Stress: Not on file  Social Connections: Not on file  Intimate Partner Violence: Not on file    REVIEW OF SYSTEMS: Constitutional: Weakness.  Fatigue. ENT:  No nose bleeds Pulm: Mildly productive cough. CV:  No angina.  No LE edema.  + Tachypalpitations. GU:  No hematuria, no frequency GI: See HPI Heme: Denies excessive or unusual bleeding other than this morning's hematochezia. Transfusions: None Neuro: Tingling and numbness in his feet.  No headaches.  No seizures, no syncope. Derm:  No itching, no rash or sores.  Endocrine:  No sweats or chills.  No polyuria or dysuria Immunization: Has not had a flu shot for this current flu season.  Has undergone at least 2 COVID-19 vaccinations. Travel: Not queried.   PHYSICAL EXAM: Vital signs in last 24 hours: Vitals:   04/22/22 1159 04/22/22 1440  BP:  (!) 135/56  Pulse:  96  Resp:  18  Temp:  97.8 F (36.6 C)  SpO2: 97% 94%   Wt Readings from Last 3 Encounters:  04/22/22 70.3 kg  02/05/21 72.2 kg  11/02/20 71.8 kg    General: Patient does not appear acutely ill.  He is resting on the bed comfortable and alert. Head: No facial asymmetry or signs of head trauma. Eyes: No conjunctival pallor or scleral icterus. Ears: No obvious hearing deficit. Nose: No congestion, no discharge Mouth: Poor dentition.  At least 50% of his teeth are gone remaining teeth in poor repair.  Mucosa is moist, pink, clear.  Tongue midline. Neck: No JVD, no masses Lungs: Diminished breath sounds but clear bilaterally.  Raspy/hoarse vocal quality  with some mild wet cough. Heart: RRR.  No MRG.  S1, S2 present Abdomen: Soft, not tender, not distended.  No HSM, masses, bruits, hernias..   Rectal: Small quantity of red blood on exam glove.  No palpable or visible masses. Musc/Skeltl: No joint redness, swelling or gross deformity. Extremities: No CCE. R great toe amputation.   Neurologic: Oriented times 3.  Moves all 4 limbs.  Speech is slow but clear, accurate. Skin:  changes of sun exposure on face and hands/arms Nodes: No cervical adenopathy Psych: Cooperative, pleasant, calm.  Intake/Output from previous day: No intake/output data recorded. Intake/Output this shift: No intake/output data recorded.  LAB RESULTS: Recent Labs    04/22/22 1224 04/22/22 1245  WBC 10.2  --   HGB 9.8* 9.5*  HCT 29.8* 28.0*  PLT 231  --    BMET Lab Results  Component Value Date   NA 131 (L) 04/22/2022   NA 133 (L) 04/22/2022   NA 132 (L) 02/05/2021   K 4.3 04/22/2022   K 4.4 04/22/2022   K 4.6 02/05/2021  CL 107 04/22/2022   CL 105 04/22/2022   CL 100 02/05/2021   CO2 18 (L) 04/22/2022   CO2 22 02/05/2021   CO2 23 11/02/2020   GLUCOSE 195 (H) 04/22/2022   GLUCOSE 191 (H) 04/22/2022   GLUCOSE 118 (H) 02/05/2021   BUN 17 04/22/2022   BUN 17 04/22/2022   BUN 9 02/05/2021   CREATININE 1.20 04/22/2022   CREATININE 1.42 (H) 04/22/2022   CREATININE 1.29 02/05/2021   CALCIUM 8.9 04/22/2022   CALCIUM 9.8 02/05/2021   CALCIUM 9.8 11/02/2020   LFT Recent Labs    04/22/22 1224  PROT 5.5*  ALBUMIN 3.0*  AST 14*  ALT 13  ALKPHOS 70  BILITOT 0.6   PT/INR Lab Results  Component Value Date   INR 1.1 09/01/2020   INR 1.1 11/28/2019   INR 1.01 10/23/2016   Hepatitis Panel No results for input(s): "HEPBSAG", "HCVAB", "HEPAIGM", "HEPBIGM" in the last 72 hours. C-Diff No components found for: "CDIFF" Lipase     Component Value Date/Time   LIPASE 27 06/05/2020 2040    Drugs of Abuse     Component Value Date/Time   LABOPIA  NONE DETECTED 06/07/2020 0747   COCAINSCRNUR NONE DETECTED 06/07/2020 0747   LABBENZ NONE DETECTED 06/07/2020 0747   AMPHETMU NONE DETECTED 06/07/2020 0747   THCU NONE DETECTED 06/07/2020 0747   LABBARB NONE DETECTED 06/07/2020 0747     RADIOLOGY STUDIES: CT CHEST ABDOMEN PELVIS W CONTRAST  Result Date: 04/22/2022 CLINICAL DATA:  Chest wall pain. Polytrauma, blunt [id number] Trauma [phone] EXAM: CT CHEST, ABDOMEN, AND PELVIS WITH CONTRAST TECHNIQUE: Multidetector CT imaging of the chest, abdomen and pelvis was performed following the standard protocol during bolus administration of intravenous contrast. RADIATION DOSE REDUCTION: This exam was performed according to the departmental dose-optimization program which includes automated exposure control, adjustment of the mA and/or kV according to patient size and/or use of iterative reconstruction technique. CONTRAST:  79m OMNIPAQUE IOHEXOL 350 MG/ML SOLN COMPARISON:  CT abdomen pelvis 06/08/2020 FINDINGS: CT CHEST FINDINGS Cardiovascular: Heart size within normal limits. No pericardial effusion. Prior sternotomy and CABG. Thoracic aorta is nonaneurysmal. Atherosclerotic calcification of the aorta and coronary arteries. Central pulmonary vasculature is within normal limits. Mediastinum/Nodes: No mediastinal hematoma. No enlarged mediastinal, hilar, or axillary lymph nodes. Thyroid gland, trachea, and esophagus demonstrate no significant findings. Lungs/Pleura: Small bilateral pleural effusions. No airspace consolidation. No pneumothorax. Musculoskeletal: Acute minimally displaced fracture of the anterolateral right seventh rib. Acute minimally displaced fracture of the lateral left eighth rib. Thoracic vertebral body heights are maintained without evidence of fracture. Chronic fractures of the right eighth, tenth, and eleventh ribs. CT ABDOMEN PELVIS FINDINGS Hepatobiliary: No hepatic injury or perihepatic hematoma. Gallbladder is unremarkable. Pancreas:  Unremarkable. No pancreatic ductal dilatation or surrounding inflammatory changes. Spleen: Normal in size without focal abnormality. Adrenals/Urinary Tract: No adrenal hemorrhage or renal injury identified. Urinary bladder is moderately distended with mild diffuse wall thickening. Stomach/Bowel: Stomach is within normal limits. Appendix appears normal. No evidence of bowel wall thickening, distention, or inflammatory changes. Vascular/Lymphatic: Aortic atherosclerosis. No enlarged abdominal or pelvic lymph nodes. Reproductive: Prostate is unremarkable. Other: No free fluid. No abdominopelvic fluid collection. No pneumoperitoneum. No abdominal wall hernia. Musculoskeletal: No acute or significant osseous findings. Prior L4-S1 fusion. Advanced multilevel degenerative disc disease of the lumbar spine. IMPRESSION: 1. Acute minimally displaced fractures of the right seventh and left eighth ribs. No pneumothorax. 2. Small bilateral pleural effusions. 3. No acute traumatic injury within the abdomen or pelvis. 4.  Mild diffuse wall thickening of the urinary bladder. Correlate with urinalysis to exclude cystitis. 5. Aortic and coronary artery atherosclerosis (ICD10-I70.0). Electronically Signed   By: Davina Poke D.O.   On: 04/22/2022 14:01   CT HEAD WO CONTRAST  Result Date: 04/22/2022 CLINICAL DATA:  Trauma EXAM: CT HEAD WITHOUT CONTRAST CT CERVICAL SPINE WITHOUT CONTRAST TECHNIQUE: Multidetector CT imaging of the head and cervical spine was performed following the standard protocol without intravenous contrast. Multiplanar CT image reconstructions of the cervical spine were also generated. RADIATION DOSE REDUCTION: This exam was performed according to the departmental dose-optimization program which includes automated exposure control, adjustment of the mA and/or kV according to patient size and/or use of iterative reconstruction technique. COMPARISON:  MRI Brain 04/04/2015 FINDINGS: CT HEAD FINDINGS Brain: No  hemorrhage. No extra-axial fluid collection. There is sequela of severe chronic microvascular ischemic change with likely subacute, but technically age indeterminate infarcts in the left occipital lobe (series 3, image 15). There is ventriculomegaly, which is likely proportional to the degree of volume loss, which is advanced. Vascular: No hyperdense vessel or unexpected calcification. Skull: Normal. Negative for fracture or focal lesion. Sinuses/Orbits: No acute finding. Other: None. CT CERVICAL SPINE FINDINGS Alignment: There is straightening of the normal cervical lordosis. Skull base and vertebrae: No acute fracture. No primary bone lesion or focal pathologic process. Soft tissues and spinal canal: No prevertebral fluid or swelling. No visible canal hematoma. Right common carotid artery vascular stent place. Disc levels: There are multilevel degenerative changes with calcified disc bulges at the C3-C4, C4-C5, and C5-C6 level. There is likely moderate to severe spinal canal stenosis at the C4-C5 and C5-C6 vertebral body levels Upper chest: Negative. Other: None IMPRESSION: 1. No CT evidence of intracranial injury. 2. Possibly subacute, but technically age indeterminate infarct in the left occipital lobe. Recommend MRI for further evaluation. 3. No acute cervical spine fracture. Multilevel degenerative changes with likely moderate to severe spinal canal stenosis at the C4-C5 and C5-C6 vertebral body levels. Electronically Signed   By: Marin Roberts M.D.   On: 04/22/2022 13:49   CT CERVICAL SPINE WO CONTRAST  Result Date: 04/22/2022 CLINICAL DATA:  Trauma EXAM: CT HEAD WITHOUT CONTRAST CT CERVICAL SPINE WITHOUT CONTRAST TECHNIQUE: Multidetector CT imaging of the head and cervical spine was performed following the standard protocol without intravenous contrast. Multiplanar CT image reconstructions of the cervical spine were also generated. RADIATION DOSE REDUCTION: This exam was performed according to the  departmental dose-optimization program which includes automated exposure control, adjustment of the mA and/or kV according to patient size and/or use of iterative reconstruction technique. COMPARISON:  MRI Brain 04/04/2015 FINDINGS: CT HEAD FINDINGS Brain: No hemorrhage. No extra-axial fluid collection. There is sequela of severe chronic microvascular ischemic change with likely subacute, but technically age indeterminate infarcts in the left occipital lobe (series 3, image 15). There is ventriculomegaly, which is likely proportional to the degree of volume loss, which is advanced. Vascular: No hyperdense vessel or unexpected calcification. Skull: Normal. Negative for fracture or focal lesion. Sinuses/Orbits: No acute finding. Other: None. CT CERVICAL SPINE FINDINGS Alignment: There is straightening of the normal cervical lordosis. Skull base and vertebrae: No acute fracture. No primary bone lesion or focal pathologic process. Soft tissues and spinal canal: No prevertebral fluid or swelling. No visible canal hematoma. Right common carotid artery vascular stent place. Disc levels: There are multilevel degenerative changes with calcified disc bulges at the C3-C4, C4-C5, and C5-C6 level. There is likely moderate to severe spinal  canal stenosis at the C4-C5 and C5-C6 vertebral body levels Upper chest: Negative. Other: None IMPRESSION: 1. No CT evidence of intracranial injury. 2. Possibly subacute, but technically age indeterminate infarct in the left occipital lobe. Recommend MRI for further evaluation. 3. No acute cervical spine fracture. Multilevel degenerative changes with likely moderate to severe spinal canal stenosis at the C4-C5 and C5-C6 vertebral body levels. Electronically Signed   By: Marin Roberts M.D.   On: 04/22/2022 13:49   DG Chest 2 View  Result Date: 04/22/2022 CLINICAL DATA:  Chest wall pain since fall a few days ago. EXAM: CHEST - 2 VIEW COMPARISON:  Chest x-ray dated September 01, 2020. FINDINGS:  The heart size and mediastinal contours are within normal limits. Prior CABG. Both lungs are clear. The visualized skeletal structures are unremarkable. IMPRESSION: 1. No acute cardiopulmonary disease. Electronically Signed   By: Titus Dubin M.D.   On: 04/22/2022 12:46      IMPRESSION:   Painless hematochezia.  Clinically it sounds like a diverticular bleed though on his colonoscopy 2 years ago diverticulosis was not mentioned.  Multiple diminutive adenomatous colon polyps removed in 2021.  Callback letter sent  December 2022, but no pt response.  Takes ~ 800 to 1600 mg Ibuprofen several days per week so could possibly have Upper GI ulcer for source of bleeding. This is less likely given BUN not elevated and no sxs referable to ulcers or GERD.  No PPI at home.    Chronic Plavix.  Last dose this morning 10/31.  Peripheral artery disease, status post interventions to lower extremities and right great toe amputation.  CVA following ICA stenting.  CAD, previous CABG 2011, stent 2018.  Recent onset of weakness and subsequent fall w imaging confirmed rib fractures.     NIDDM.      PLAN:     Per Dr. Lyndel Safe.  ? Repeat colonoscopy, EGD.  Probably plan for Thursday 11/2  Ordered diet for patient.  Does not need PPI drip.   Added Protonix 40 mg daily.     Azucena Freed  04/22/2022, 4:12 PM Phone [phone]   Attending physician's note   I have taken history, reviewed the chart and examined the patient. I performed a substantive portion of this encounter, including complete performance of at least one of the key components, in conjunction with the APP. I agree with the Advanced Practitioner's note, impression and recommendations.   Painless hematochezia. HD stable. Upper vs lower GI bleed. Hb 12 (baseline) to 9.5. Pt on plavix/ASA (last dose 10/31) and NSAIDs. Neg CT AP today. Last colon Dec 2021 with several small tubular adenomas.Last EGD Dec 2021 with Schatzki's ring s/p dil.   CAD s/p  CABG/stenting, PVD on ASA/plavix. CVA, DM2, COPD with continued smoking.  Recent fall with # R ribs  Plan: -IV protonix -Hold plavix and NSAIDs. -Trend CBC -If active brisk bleeding, CTA.If +, IR embolozation. -EGD/colon 11/2 tentatively after few days of Plavix washout.   Carmell Austria, MD [facility] [phone]

## 2022-04-22 NOTE — Assessment & Plan Note (Signed)
Continue Imdur 

## 2022-04-23 DIAGNOSIS — I25118 Atherosclerotic heart disease of native coronary artery with other forms of angina pectoris: Secondary | ICD-10-CM | POA: Diagnosis not present

## 2022-04-23 DIAGNOSIS — D62 Acute posthemorrhagic anemia: Secondary | ICD-10-CM | POA: Diagnosis not present

## 2022-04-23 DIAGNOSIS — K921 Melena: Secondary | ICD-10-CM | POA: Diagnosis not present

## 2022-04-23 DIAGNOSIS — E034 Atrophy of thyroid (acquired): Secondary | ICD-10-CM

## 2022-04-23 DIAGNOSIS — S2249XA Multiple fractures of ribs, unspecified side, initial encounter for closed fracture: Secondary | ICD-10-CM

## 2022-04-23 LAB — BASIC METABOLIC PANEL
Anion gap: 8 (ref 5–15)
BUN: 21 mg/dL (ref 8–23)
CO2: 20 mmol/L — ABNORMAL LOW (ref 22–32)
Calcium: 8.5 mg/dL — ABNORMAL LOW (ref 8.9–10.3)
Chloride: 106 mmol/L (ref 98–111)
Creatinine, Ser: 1.37 mg/dL — ABNORMAL HIGH (ref 0.61–1.24)
GFR, Estimated: 56 mL/min — ABNORMAL LOW (ref >60)
Glucose, Bld: 124 mg/dL — ABNORMAL HIGH (ref 70–99)
Potassium: 4.2 mmol/L (ref 3.5–5.1)
Sodium: 134 mmol/L — ABNORMAL LOW (ref 135–145)

## 2022-04-23 LAB — GLUCOSE, CAPILLARY
Glucose-Capillary: 178 mg/dL — ABNORMAL HIGH (ref 70–99)
Glucose-Capillary: 187 mg/dL — ABNORMAL HIGH (ref 70–99)
Glucose-Capillary: 123 mg/dL — ABNORMAL HIGH (ref 70–99)

## 2022-04-23 LAB — CBC
HCT: 22.6 % — ABNORMAL LOW (ref 39.0–52.0)
Hemoglobin: 7.6 g/dL — ABNORMAL LOW (ref 13.0–17.0)
MCH: 33.5 pg (ref 26.0–34.0)
MCHC: 33.6 g/dL (ref 30.0–36.0)
MCV: 99.6 fL (ref 80.0–100.0)
Platelets: 204 10*3/uL (ref 150–400)
RBC: 2.27 MIL/uL — ABNORMAL LOW (ref 4.22–5.81)
RDW: 14 % (ref 11.5–15.5)
WBC: 9.6 10*3/uL (ref 4.0–10.5)
nRBC: 0 % (ref 0.0–0.2)

## 2022-04-23 LAB — HEMOGLOBIN A1C
Hgb A1c MFr Bld: 7 % — ABNORMAL HIGH (ref 4.8–5.6)
Mean Plasma Glucose: 154.2 mg/dL

## 2022-04-23 LAB — HIV ANTIBODY (ROUTINE TESTING W REFLEX): HIV Screen 4th Generation wRfx: NONREACTIVE

## 2022-04-23 MED ORDER — PEG-KCL-NACL-NASULF-NA ASC-C 100 G PO SOLR
0.5000 | Freq: Once | ORAL | Status: AC
  Administered 2022-04-23: 100 g via ORAL
  Filled 2022-04-23: qty 1

## 2022-04-23 MED ORDER — METOCLOPRAMIDE HCL 5 MG/ML IJ SOLN
10.0000 mg | Freq: Once | INTRAMUSCULAR | Status: AC
  Administered 2022-04-23: 10 mg via INTRAVENOUS
  Filled 2022-04-23: qty 2

## 2022-04-23 MED ORDER — PEG-KCL-NACL-NASULF-NA ASC-C 100 G PO SOLR: 1.0000 | Freq: Once | ORAL | Status: DC

## 2022-04-23 MED ORDER — METOCLOPRAMIDE HCL 5 MG/ML IJ SOLN
10.0000 mg | Freq: Once | INTRAMUSCULAR | Status: AC
  Administered 2022-04-24: 10 mg via INTRAVENOUS
  Filled 2022-04-23: qty 2

## 2022-04-23 MED ORDER — PEG-KCL-NACL-NASULF-NA ASC-C 100 G PO SOLR
0.5000 | Freq: Once | ORAL | Status: AC
  Administered 2022-04-24: 100 g via ORAL
  Filled 2022-04-23: qty 1

## 2022-04-23 MED ORDER — BISACODYL 5 MG PO TBEC
20.0000 mg | DELAYED_RELEASE_TABLET | Freq: Once | ORAL | Status: AC
  Administered 2022-04-23: 20 mg via ORAL
  Filled 2022-04-23: qty 4

## 2022-04-23 NOTE — H&P (View-Only) (Signed)
Daily Rounding Note  04/23/2022, 12:35 PM  LOS: 0 days   SUBJECTIVE:   Chief complaint:      Soft blood pressure readings with 1 episode of hypotension at 97/49.  No tachycardia.  Room air sats high 90s.  Pt reports no additional episodes of hematochezia or stools whatsoever after I saw him around 5 PM yesterday.  Denies abdominal pain.  No nausea.  Experiencing pain in his ribs shoulder and back.  OBJECTIVE:         Vital signs in last 24 hours:    Temp:  [97.8 F (36.6 C)-98.6 F (37 C)] 98 F (36.7 C) (11/01 0851) Pulse Rate:  [75-96] 88 (11/01 0851) Resp:  [18-25] 18 (11/01 0851) BP: (97-135)/(49-74) 114/54 (11/01 0851) SpO2:  [94 %-99 %] 99 % (11/01 0851) Last BM Date : 04/22/22 Filed Weights   04/22/22 1201  Weight: 70.3 kg   General: NAD.  Comfortable.  Resting comfortably. Heart: RRR. Chest: No labored breathing or cough Abdomen: Soft without tenderness.  No distention.  Active bowel sounds. Extremities: No CCE. Neuro/Psych: Drowsy but he awakens..  Appropriate.  Oriented x3.  Intake/Output from previous day: No intake/output data recorded.  Intake/Output this shift: Total I/O In: 240 [P.O.:240] Out: -   Lab Results: Recent Labs    04/22/22 1224 04/22/22 1245 04/23/22 0404  WBC 10.2  --  9.6  HGB 9.8* 9.5* 7.6*  HCT 29.8* 28.0* 22.6*  PLT 231  --  204   BMET Recent Labs    04/22/22 1224 04/22/22 1245 04/23/22 0404  NA 133* 131* 134*  K 4.4 4.3 4.2  CL 105 107 106  CO2 18*  --  20*  GLUCOSE 191* 195* 124*  BUN '17 17 21  '$ CREATININE 1.42* 1.20 1.37*  CALCIUM 8.9  --  8.5*   LFT Recent Labs    04/22/22 1224  PROT 5.5*  ALBUMIN 3.0*  AST 14*  ALT 13  ALKPHOS 70  BILITOT 0.6   PT/INR No results for input(s): "LABPROT", "INR" in the last 72 hours. Hepatitis Panel No results for input(s): "HEPBSAG", "HCVAB", "HEPAIGM", "HEPBIGM" in the last 72 hours.  Studies/Results: MR  BRAIN WO CONTRAST  Result Date: 04/22/2022 CLINICAL DATA:  Fall last week, lightheadedness. EXAM: MRI HEAD WITHOUT CONTRAST TECHNIQUE: Multiplanar, multiecho pulse sequences of the brain and surrounding structures were obtained without intravenous contrast. COMPARISON:  Same-day noncontrast CT head FINDINGS: Brain: There is no acute intracranial hemorrhage, extra-axial fluid collection, or acute infarct There is moderate background parenchymal volume loss with prominence of the ventricular system and extra-axial CSF spaces. There is a small remote infarct in the right parietal white matter extending to the cortex which is evolved since 2016. A small remote lacunar infarct in the left cerebellar hemisphere is new since 2016. A small area of encephalomalacia in the left insular region was present in 2016. There is a tiny cortical infarct in the right precentral gyrus, unchanged since 2016. There is a remote cortical infarct in the left occipital lobe, accounting for the finding on the CT head from 1 day priorand unchanged since the MRI from 2016. There is patchy FLAIR signal abnormality in the supratentorial white matter likely reflecting moderate chronic small vessel ischemic change, overall progressed since 2016. No chronic blood products are seen. There is no mass lesion.  There is no mass effect or midline shift. Vascular: The left high cervical and intracranial ICA flow void is absent,  unchanged since 2016 and likely occluded. The MCA and ACA flow voids are normal. The other major flow voids are normal. Skull and upper cervical spine: Normal marrow signal. Sinuses/Orbits: There is complete opacification of the sphenoid sinuses with findings consistent with chronic sinusitis on the prior CT. Bilateral lens implants are in place. The globes and orbits are otherwise unremarkable. Other: There are trace mastoid effusions. IMPRESSION: 1. No acute intracranial pathology. 2. Remote infarct in the left occipital lobe  accounting for the finding on the head CT from 1 day prior. 3. Additional small remote infarcts and background chronic small-vessel ischemic changes are overall progressed since 2016 4. Absent left ICA flow void, likely occluded and unchanged since 2016 Electronically Signed   By: Valetta Mole M.D.   On: 04/22/2022 18:33   CT CHEST ABDOMEN PELVIS W CONTRAST  Result Date: 04/22/2022 CLINICAL DATA:  Chest wall pain. Polytrauma, blunt [id number] Trauma [phone] EXAM: CT CHEST, ABDOMEN, AND PELVIS WITH CONTRAST TECHNIQUE: Multidetector CT imaging of the chest, abdomen and pelvis was performed following the standard protocol during bolus administration of intravenous contrast. RADIATION DOSE REDUCTION: This exam was performed according to the departmental dose-optimization program which includes automated exposure control, adjustment of the mA and/or kV according to patient size and/or use of iterative reconstruction technique. CONTRAST:  37m OMNIPAQUE IOHEXOL 350 MG/ML SOLN COMPARISON:  CT abdomen pelvis 06/08/2020 FINDINGS: CT CHEST FINDINGS Cardiovascular: Heart size within normal limits. No pericardial effusion. Prior sternotomy and CABG. Thoracic aorta is nonaneurysmal. Atherosclerotic calcification of the aorta and coronary arteries. Central pulmonary vasculature is within normal limits. Mediastinum/Nodes: No mediastinal hematoma. No enlarged mediastinal, hilar, or axillary lymph nodes. Thyroid gland, trachea, and esophagus demonstrate no significant findings. Lungs/Pleura: Small bilateral pleural effusions. No airspace consolidation. No pneumothorax. Musculoskeletal: Acute minimally displaced fracture of the anterolateral right seventh rib. Acute minimally displaced fracture of the lateral left eighth rib. Thoracic vertebral body heights are maintained without evidence of fracture. Chronic fractures of the right eighth, tenth, and eleventh ribs. CT ABDOMEN PELVIS FINDINGS Hepatobiliary: No hepatic injury or  perihepatic hematoma. Gallbladder is unremarkable. Pancreas: Unremarkable. No pancreatic ductal dilatation or surrounding inflammatory changes. Spleen: Normal in size without focal abnormality. Adrenals/Urinary Tract: No adrenal hemorrhage or renal injury identified. Urinary bladder is moderately distended with mild diffuse wall thickening. Stomach/Bowel: Stomach is within normal limits. Appendix appears normal. No evidence of bowel wall thickening, distention, or inflammatory changes. Vascular/Lymphatic: Aortic atherosclerosis. No enlarged abdominal or pelvic lymph nodes. Reproductive: Prostate is unremarkable. Other: No free fluid. No abdominopelvic fluid collection. No pneumoperitoneum. No abdominal wall hernia. Musculoskeletal: No acute or significant osseous findings. Prior L4-S1 fusion. Advanced multilevel degenerative disc disease of the lumbar spine. IMPRESSION: 1. Acute minimally displaced fractures of the right seventh and left eighth ribs. No pneumothorax. 2. Small bilateral pleural effusions. 3. No acute traumatic injury within the abdomen or pelvis. 4. Mild diffuse wall thickening of the urinary bladder. Correlate with urinalysis to exclude cystitis. 5. Aortic and coronary artery atherosclerosis (ICD10-I70.0). Electronically Signed   By: NDavina PokeD.O.   On: 04/22/2022 14:01   CT HEAD WO CONTRAST  Result Date: 04/22/2022 CLINICAL DATA:  Trauma EXAM: CT HEAD WITHOUT CONTRAST CT CERVICAL SPINE WITHOUT CONTRAST TECHNIQUE: Multidetector CT imaging of the head and cervical spine was performed following the standard protocol without intravenous contrast. Multiplanar CT image reconstructions of the cervical spine were also generated. RADIATION DOSE REDUCTION: This exam was performed according to the departmental dose-optimization program which includes automated exposure  control, adjustment of the mA and/or kV according to patient size and/or use of iterative reconstruction technique. COMPARISON:   MRI Brain 04/04/2015 FINDINGS: CT HEAD FINDINGS Brain: No hemorrhage. No extra-axial fluid collection. There is sequela of severe chronic microvascular ischemic change with likely subacute, but technically age indeterminate infarcts in the left occipital lobe (series 3, image 15). There is ventriculomegaly, which is likely proportional to the degree of volume loss, which is advanced. Vascular: No hyperdense vessel or unexpected calcification. Skull: Normal. Negative for fracture or focal lesion. Sinuses/Orbits: No acute finding. Other: None. CT CERVICAL SPINE FINDINGS Alignment: There is straightening of the normal cervical lordosis. Skull base and vertebrae: No acute fracture. No primary bone lesion or focal pathologic process. Soft tissues and spinal canal: No prevertebral fluid or swelling. No visible canal hematoma. Right common carotid artery vascular stent place. Disc levels: There are multilevel degenerative changes with calcified disc bulges at the C3-C4, C4-C5, and C5-C6 level. There is likely moderate to severe spinal canal stenosis at the C4-C5 and C5-C6 vertebral body levels Upper chest: Negative. Other: None IMPRESSION: 1. No CT evidence of intracranial injury. 2. Possibly subacute, but technically age indeterminate infarct in the left occipital lobe. Recommend MRI for further evaluation. 3. No acute cervical spine fracture. Multilevel degenerative changes with likely moderate to severe spinal canal stenosis at the C4-C5 and C5-C6 vertebral body levels. Electronically Signed   By: Marin Roberts M.D.   On: 04/22/2022 13:49   CT CERVICAL SPINE WO CONTRAST  Result Date: 04/22/2022 CLINICAL DATA:  Trauma EXAM: CT HEAD WITHOUT CONTRAST CT CERVICAL SPINE WITHOUT CONTRAST TECHNIQUE: Multidetector CT imaging of the head and cervical spine was performed following the standard protocol without intravenous contrast. Multiplanar CT image reconstructions of the cervical spine were also generated. RADIATION DOSE  REDUCTION: This exam was performed according to the departmental dose-optimization program which includes automated exposure control, adjustment of the mA and/or kV according to patient size and/or use of iterative reconstruction technique. COMPARISON:  MRI Brain 04/04/2015 FINDINGS: CT HEAD FINDINGS Brain: No hemorrhage. No extra-axial fluid collection. There is sequela of severe chronic microvascular ischemic change with likely subacute, but technically age indeterminate infarcts in the left occipital lobe (series 3, image 15). There is ventriculomegaly, which is likely proportional to the degree of volume loss, which is advanced. Vascular: No hyperdense vessel or unexpected calcification. Skull: Normal. Negative for fracture or focal lesion. Sinuses/Orbits: No acute finding. Other: None. CT CERVICAL SPINE FINDINGS Alignment: There is straightening of the normal cervical lordosis. Skull base and vertebrae: No acute fracture. No primary bone lesion or focal pathologic process. Soft tissues and spinal canal: No prevertebral fluid or swelling. No visible canal hematoma. Right common carotid artery vascular stent place. Disc levels: There are multilevel degenerative changes with calcified disc bulges at the C3-C4, C4-C5, and C5-C6 level. There is likely moderate to severe spinal canal stenosis at the C4-C5 and C5-C6 vertebral body levels Upper chest: Negative. Other: None IMPRESSION: 1. No CT evidence of intracranial injury. 2. Possibly subacute, but technically age indeterminate infarct in the left occipital lobe. Recommend MRI for further evaluation. 3. No acute cervical spine fracture. Multilevel degenerative changes with likely moderate to severe spinal canal stenosis at the C4-C5 and C5-C6 vertebral body levels. Electronically Signed   By: Marin Roberts M.D.   On: 04/22/2022 13:49   DG Chest 2 View  Result Date: 04/22/2022 CLINICAL DATA:  Chest wall pain since fall a few days ago. EXAM: CHEST - 2  VIEW  COMPARISON:  Chest x-ray dated September 01, 2020. FINDINGS: The heart size and mediastinal contours are within normal limits. Prior CABG. Both lungs are clear. The visualized skeletal structures are unremarkable. IMPRESSION: 1. No acute cardiopulmonary disease. Electronically Signed   By: Titus Dubin M.D.   On: 04/22/2022 12:46    Scheduled Meds:  empagliflozin  10 mg Oral Daily   escitalopram  20 mg Oral Daily   gabapentin  600 mg Oral BID   insulin aspart  0-15 Units Subcutaneous TID WC   isosorbide mononitrate  120 mg Oral Daily   levothyroxine  50 mcg Oral Daily   pantoprazole  40 mg Oral Q0600   ranolazine  500 mg Oral BID   rosuvastatin  40 mg Oral Daily   Continuous Infusions: PRN Meds:.acetaminophen **OR** acetaminophen, albuterol, docusate sodium, LORazepam, ondansetron **OR** ondansetron (ZOFRAN) IV, oxyCODONE-acetaminophen   ASSESMENT:   Painless hematochezia.  Sounds diverticular in nature though colonoscopy in 2021 did not show diverticulosis.    Blood loss anemia.  Hgb 9.8.. 7.7.  No transfusions ordered or received thus far.  Chronic Plavix, last dose 10/31.  Indications include PAD with revascularization interventions to lower extremities  Recent weakness resulting in a fall sustaining rib fractures.   IDDM.  A1c elevated at 7 (mean glucose 154)  AKI with normal BUN.  GFR improved cw yesterday and improved from a year ago.   PLAN   Plan EGD and colonoscopy for tomorrow 1300.  Please see orders split dose movi prep tonight and early AM tmrw w reglan.  Dulcolax po now.  Clears.  Npo after 0900 tmrw.  Azucena Freed  04/23/2022, 12:35 PM Phone [phone]     Attending physician's note   I have taken history, reviewed the chart and examined the patient. I performed a substantive portion of this encounter, including complete performance of at least one of the key components, in conjunction with the APP. I agree with the Advanced Practitioner's note,  impression and recommendations.   Expedited EGD/colon tomorrow.  Explained risks and benefits. Plavix on hold. Last dose 10/31  Hb 12 (baseline) to 9.5 to 7.6. No active bleeding. If it drops to 7 or below, pl transfuse.  Carmell Austria, MD [facility] [phone]

## 2022-04-23 NOTE — Progress Notes (Addendum)
Daily Rounding Note  04/23/2022, 12:35 PM  LOS: 0 days   SUBJECTIVE:   Chief complaint:      Soft blood pressure readings with 1 episode of hypotension at 97/49.  No tachycardia.  Room air sats high 90s.  Pt reports no additional episodes of hematochezia or stools whatsoever after I saw him around 5 PM yesterday.  Denies abdominal pain.  No nausea.  Experiencing pain in his ribs shoulder and back.  OBJECTIVE:         Vital signs in last 24 hours:    Temp:  [97.8 F (36.6 C)-98.6 F (37 C)] 98 F (36.7 C) (11/01 0851) Pulse Rate:  [75-96] 88 (11/01 0851) Resp:  [18-25] 18 (11/01 0851) BP: (97-135)/(49-74) 114/54 (11/01 0851) SpO2:  [94 %-99 %] 99 % (11/01 0851) Last BM Date : 04/22/22 Filed Weights   04/22/22 1201  Weight: 70.3 kg   General: NAD.  Comfortable.  Resting comfortably. Heart: RRR. Chest: No labored breathing or cough Abdomen: Soft without tenderness.  No distention.  Active bowel sounds. Extremities: No CCE. Neuro/Psych: Drowsy but he awakens..  Appropriate.  Oriented x3.  Intake/Output from previous day: No intake/output data recorded.  Intake/Output this shift: Total I/O In: 240 [P.O.:240] Out: -   Lab Results: Recent Labs    04/22/22 1224 04/22/22 1245 04/23/22 0404  WBC 10.2  --  9.6  HGB 9.8* 9.5* 7.6*  HCT 29.8* 28.0* 22.6*  PLT 231  --  204   BMET Recent Labs    04/22/22 1224 04/22/22 1245 04/23/22 0404  NA 133* 131* 134*  K 4.4 4.3 4.2  CL 105 107 106  CO2 18*  --  20*  GLUCOSE 191* 195* 124*  BUN '17 17 21  '$ CREATININE 1.42* 1.20 1.37*  CALCIUM 8.9  --  8.5*   LFT Recent Labs    04/22/22 1224  PROT 5.5*  ALBUMIN 3.0*  AST 14*  ALT 13  ALKPHOS 70  BILITOT 0.6   PT/INR No results for input(s): "LABPROT", "INR" in the last 72 hours. Hepatitis Panel No results for input(s): "HEPBSAG", "HCVAB", "HEPAIGM", "HEPBIGM" in the last 72 hours.  Studies/Results: MR  BRAIN WO CONTRAST  Result Date: 04/22/2022 CLINICAL DATA:  Fall last week, lightheadedness. EXAM: MRI HEAD WITHOUT CONTRAST TECHNIQUE: Multiplanar, multiecho pulse sequences of the brain and surrounding structures were obtained without intravenous contrast. COMPARISON:  Same-day noncontrast CT head FINDINGS: Brain: There is no acute intracranial hemorrhage, extra-axial fluid collection, or acute infarct There is moderate background parenchymal volume loss with prominence of the ventricular system and extra-axial CSF spaces. There is a small remote infarct in the right parietal white matter extending to the cortex which is evolved since 2016. A small remote lacunar infarct in the left cerebellar hemisphere is new since 2016. A small area of encephalomalacia in the left insular region was present in 2016. There is a tiny cortical infarct in the right precentral gyrus, unchanged since 2016. There is a remote cortical infarct in the left occipital lobe, accounting for the finding on the CT head from 1 day priorand unchanged since the MRI from 2016. There is patchy FLAIR signal abnormality in the supratentorial white matter likely reflecting moderate chronic small vessel ischemic change, overall progressed since 2016. No chronic blood products are seen. There is no mass lesion.  There is no mass effect or midline shift. Vascular: The left high cervical and intracranial ICA flow void is absent,  unchanged since 2016 and likely occluded. The MCA and ACA flow voids are normal. The other major flow voids are normal. Skull and upper cervical spine: Normal marrow signal. Sinuses/Orbits: There is complete opacification of the sphenoid sinuses with findings consistent with chronic sinusitis on the prior CT. Bilateral lens implants are in place. The globes and orbits are otherwise unremarkable. Other: There are trace mastoid effusions. IMPRESSION: 1. No acute intracranial pathology. 2. Remote infarct in the left occipital lobe  accounting for the finding on the head CT from 1 day prior. 3. Additional small remote infarcts and background chronic small-vessel ischemic changes are overall progressed since 2016 4. Absent left ICA flow void, likely occluded and unchanged since 2016 Electronically Signed   By: Valetta Mole M.D.   On: 04/22/2022 18:33   CT CHEST ABDOMEN PELVIS W CONTRAST  Result Date: 04/22/2022 CLINICAL DATA:  Chest wall pain. Polytrauma, blunt [id number] Trauma [phone] EXAM: CT CHEST, ABDOMEN, AND PELVIS WITH CONTRAST TECHNIQUE: Multidetector CT imaging of the chest, abdomen and pelvis was performed following the standard protocol during bolus administration of intravenous contrast. RADIATION DOSE REDUCTION: This exam was performed according to the departmental dose-optimization program which includes automated exposure control, adjustment of the mA and/or kV according to patient size and/or use of iterative reconstruction technique. CONTRAST:  43m OMNIPAQUE IOHEXOL 350 MG/ML SOLN COMPARISON:  CT abdomen pelvis 06/08/2020 FINDINGS: CT CHEST FINDINGS Cardiovascular: Heart size within normal limits. No pericardial effusion. Prior sternotomy and CABG. Thoracic aorta is nonaneurysmal. Atherosclerotic calcification of the aorta and coronary arteries. Central pulmonary vasculature is within normal limits. Mediastinum/Nodes: No mediastinal hematoma. No enlarged mediastinal, hilar, or axillary lymph nodes. Thyroid gland, trachea, and esophagus demonstrate no significant findings. Lungs/Pleura: Small bilateral pleural effusions. No airspace consolidation. No pneumothorax. Musculoskeletal: Acute minimally displaced fracture of the anterolateral right seventh rib. Acute minimally displaced fracture of the lateral left eighth rib. Thoracic vertebral body heights are maintained without evidence of fracture. Chronic fractures of the right eighth, tenth, and eleventh ribs. CT ABDOMEN PELVIS FINDINGS Hepatobiliary: No hepatic injury or  perihepatic hematoma. Gallbladder is unremarkable. Pancreas: Unremarkable. No pancreatic ductal dilatation or surrounding inflammatory changes. Spleen: Normal in size without focal abnormality. Adrenals/Urinary Tract: No adrenal hemorrhage or renal injury identified. Urinary bladder is moderately distended with mild diffuse wall thickening. Stomach/Bowel: Stomach is within normal limits. Appendix appears normal. No evidence of bowel wall thickening, distention, or inflammatory changes. Vascular/Lymphatic: Aortic atherosclerosis. No enlarged abdominal or pelvic lymph nodes. Reproductive: Prostate is unremarkable. Other: No free fluid. No abdominopelvic fluid collection. No pneumoperitoneum. No abdominal wall hernia. Musculoskeletal: No acute or significant osseous findings. Prior L4-S1 fusion. Advanced multilevel degenerative disc disease of the lumbar spine. IMPRESSION: 1. Acute minimally displaced fractures of the right seventh and left eighth ribs. No pneumothorax. 2. Small bilateral pleural effusions. 3. No acute traumatic injury within the abdomen or pelvis. 4. Mild diffuse wall thickening of the urinary bladder. Correlate with urinalysis to exclude cystitis. 5. Aortic and coronary artery atherosclerosis (ICD10-I70.0). Electronically Signed   By: NDavina PokeD.O.   On: 04/22/2022 14:01   CT HEAD WO CONTRAST  Result Date: 04/22/2022 CLINICAL DATA:  Trauma EXAM: CT HEAD WITHOUT CONTRAST CT CERVICAL SPINE WITHOUT CONTRAST TECHNIQUE: Multidetector CT imaging of the head and cervical spine was performed following the standard protocol without intravenous contrast. Multiplanar CT image reconstructions of the cervical spine were also generated. RADIATION DOSE REDUCTION: This exam was performed according to the departmental dose-optimization program which includes automated exposure  control, adjustment of the mA and/or kV according to patient size and/or use of iterative reconstruction technique. COMPARISON:   MRI Brain 04/04/2015 FINDINGS: CT HEAD FINDINGS Brain: No hemorrhage. No extra-axial fluid collection. There is sequela of severe chronic microvascular ischemic change with likely subacute, but technically age indeterminate infarcts in the left occipital lobe (series 3, image 15). There is ventriculomegaly, which is likely proportional to the degree of volume loss, which is advanced. Vascular: No hyperdense vessel or unexpected calcification. Skull: Normal. Negative for fracture or focal lesion. Sinuses/Orbits: No acute finding. Other: None. CT CERVICAL SPINE FINDINGS Alignment: There is straightening of the normal cervical lordosis. Skull base and vertebrae: No acute fracture. No primary bone lesion or focal pathologic process. Soft tissues and spinal canal: No prevertebral fluid or swelling. No visible canal hematoma. Right common carotid artery vascular stent place. Disc levels: There are multilevel degenerative changes with calcified disc bulges at the C3-C4, C4-C5, and C5-C6 level. There is likely moderate to severe spinal canal stenosis at the C4-C5 and C5-C6 vertebral body levels Upper chest: Negative. Other: None IMPRESSION: 1. No CT evidence of intracranial injury. 2. Possibly subacute, but technically age indeterminate infarct in the left occipital lobe. Recommend MRI for further evaluation. 3. No acute cervical spine fracture. Multilevel degenerative changes with likely moderate to severe spinal canal stenosis at the C4-C5 and C5-C6 vertebral body levels. Electronically Signed   By: Marin Roberts M.D.   On: 04/22/2022 13:49   CT CERVICAL SPINE WO CONTRAST  Result Date: 04/22/2022 CLINICAL DATA:  Trauma EXAM: CT HEAD WITHOUT CONTRAST CT CERVICAL SPINE WITHOUT CONTRAST TECHNIQUE: Multidetector CT imaging of the head and cervical spine was performed following the standard protocol without intravenous contrast. Multiplanar CT image reconstructions of the cervical spine were also generated. RADIATION DOSE  REDUCTION: This exam was performed according to the departmental dose-optimization program which includes automated exposure control, adjustment of the mA and/or kV according to patient size and/or use of iterative reconstruction technique. COMPARISON:  MRI Brain 04/04/2015 FINDINGS: CT HEAD FINDINGS Brain: No hemorrhage. No extra-axial fluid collection. There is sequela of severe chronic microvascular ischemic change with likely subacute, but technically age indeterminate infarcts in the left occipital lobe (series 3, image 15). There is ventriculomegaly, which is likely proportional to the degree of volume loss, which is advanced. Vascular: No hyperdense vessel or unexpected calcification. Skull: Normal. Negative for fracture or focal lesion. Sinuses/Orbits: No acute finding. Other: None. CT CERVICAL SPINE FINDINGS Alignment: There is straightening of the normal cervical lordosis. Skull base and vertebrae: No acute fracture. No primary bone lesion or focal pathologic process. Soft tissues and spinal canal: No prevertebral fluid or swelling. No visible canal hematoma. Right common carotid artery vascular stent place. Disc levels: There are multilevel degenerative changes with calcified disc bulges at the C3-C4, C4-C5, and C5-C6 level. There is likely moderate to severe spinal canal stenosis at the C4-C5 and C5-C6 vertebral body levels Upper chest: Negative. Other: None IMPRESSION: 1. No CT evidence of intracranial injury. 2. Possibly subacute, but technically age indeterminate infarct in the left occipital lobe. Recommend MRI for further evaluation. 3. No acute cervical spine fracture. Multilevel degenerative changes with likely moderate to severe spinal canal stenosis at the C4-C5 and C5-C6 vertebral body levels. Electronically Signed   By: Marin Roberts M.D.   On: 04/22/2022 13:49   DG Chest 2 View  Result Date: 04/22/2022 CLINICAL DATA:  Chest wall pain since fall a few days ago. EXAM: CHEST - 2  VIEW  COMPARISON:  Chest x-ray dated September 01, 2020. FINDINGS: The heart size and mediastinal contours are within normal limits. Prior CABG. Both lungs are clear. The visualized skeletal structures are unremarkable. IMPRESSION: 1. No acute cardiopulmonary disease. Electronically Signed   By: Titus Dubin M.D.   On: 04/22/2022 12:46    Scheduled Meds:  empagliflozin  10 mg Oral Daily   escitalopram  20 mg Oral Daily   gabapentin  600 mg Oral BID   insulin aspart  0-15 Units Subcutaneous TID WC   isosorbide mononitrate  120 mg Oral Daily   levothyroxine  50 mcg Oral Daily   pantoprazole  40 mg Oral Q0600   ranolazine  500 mg Oral BID   rosuvastatin  40 mg Oral Daily   Continuous Infusions: PRN Meds:.acetaminophen **OR** acetaminophen, albuterol, docusate sodium, LORazepam, ondansetron **OR** ondansetron (ZOFRAN) IV, oxyCODONE-acetaminophen   ASSESMENT:   Painless hematochezia.  Sounds diverticular in nature though colonoscopy in 2021 did not show diverticulosis.    Blood loss anemia.  Hgb 9.8.. 7.7.  No transfusions ordered or received thus far.  Chronic Plavix, last dose 10/31.  Indications include PAD with revascularization interventions to lower extremities  Recent weakness resulting in a fall sustaining rib fractures.   IDDM.  A1c elevated at 7 (mean glucose 154)  AKI with normal BUN.  GFR improved cw yesterday and improved from a year ago.   PLAN   Plan EGD and colonoscopy for tomorrow 1300.  Please see orders split dose movi prep tonight and early AM tmrw w reglan.  Dulcolax po now.  Clears.  Npo after 0900 tmrw.  Azucena Freed  04/23/2022, 12:35 PM Phone [phone]     Attending physician's note   I have taken history, reviewed the chart and examined the patient. I performed a substantive portion of this encounter, including complete performance of at least one of the key components, in conjunction with the APP. I agree with the Advanced Practitioner's note,  impression and recommendations.   Expedited EGD/colon tomorrow.  Explained risks and benefits. Plavix on hold. Last dose 10/31  Hb 12 (baseline) to 9.5 to 7.6. No active bleeding. If it drops to 7 or below, pl transfuse.  Carmell Austria, MD [facility] [phone]

## 2022-04-23 NOTE — Progress Notes (Signed)
PROGRESS NOTE    Jason [name]  ZOX:[id number] DOB: [birth year] DOA: 04/22/2022 PCP: Marrian Salvage, FNP    Brief Narrative:  Jason [name] is a 71 y.o. male with past medical history significant of CAD s/p CABG 2011, subsequent stents, stroke following ICA stent on chronic DAPT, diabetes mellitus type 2 presented to the hospital  after being lightheaded while standing and his knees gave up but there was no loss of consciousness.  He also reported 4 large bloody bowel movements which were new for him.  In the ED, patient was noted to have hematochezia.  He also complained of lower rib pain after the fall.  In the ED, blood pressure was stable.  Hemoglobin was 9.8.  Sodium level was 131.  Occult blood was positive.  UA showed some leukocytes with white cells but nitrites were negative.  Patient was then considered for admission to hospital for further evaluation and treatment.  Assessment and Plan:  Principal Problem:   Hematochezia Active Problems:   ABLA (acute blood loss anemia)   Rib fractures   DM (diabetes mellitus) (HCC)   HTN (hypertension)   Hyperlipidemia   CAD, CABG Feb 2011, cath x 4 since-medical Rx   Hypothyroidism   *Painless hematochezia Had a history of falls and was taking NSAIDs for rib pain.  Hematochezia could be secondary to diverticular bleeding but he was on NSAIDs as outpatient.  Continue Protonix.  GI has seen the patient and currently DAPT on hold.  Allowing washout.  Continue Protonix.  Plan for EGD colonoscopy on 04/24/2022.  If continues to have hematochezia, might need CT angiogram and IR consultation.   acute blood loss anemia secondary to GI bleed. Current hemoglobin at 7.6.  Transfuse for ongoing bleeding/hemoglobin less than 7.  Currently patient is hemodynamically stable.  Hemoglobin 1 year back was 12.6.   Rib fractures Continue supportive care, Percocet, incentive spirometry.  Continue pain control.  History of CAD, CABG Feb 2011,  cath x 4, peripheral vascular disease. History of intervention to the lower extremities and right great toe amputation.  History of CVA with ICA stent.  Continue to hold dual antiplatelets in the setting of GI bleed with acute blood loss anemia.  Continue Ranexa.  Continue Imdur, statins.   Hyperlipidemia Continue Statin   HTN (hypertension) Continue Imdur   DM (diabetes mellitus)  Continue moderate risk of sliding scale insulin, Accu-Cheks, Jardiance.  Latest hemoglobin A1c of 7.0.  Hypothyroidism.  Continue Synthroid.  Recent weakness and fall.  We will get PT evaluation after GI assessment.     DVT prophylaxis: SCDs Start: 04/22/22 2140   Code Status:     Code Status: Full Code  Disposition: Home  Status is: Observation  The patient will require care spanning > 2 midnights and should be moved to inpatient because: Need for endoscopic evaluation, monitoring of GI bleed   Family Communication: None at present  Consultants:  GI  Procedures:  None yet  Antimicrobials:  None  Anti-infectives (From admission, onward)    None      Subjective: Today, patient was seen and examined at bedside.  Patient complains of back and rib pain.  Denies any nausea vomiting has not had a bowel movement since yesterday.  Feels fatigued and weak.  Objective: Vitals:   04/22/22 2233 04/23/22 0039 04/23/22 0547 04/23/22 0851  BP:  122/61 (!) 97/49 (!) 114/54  Pulse:  94 89 88  Resp:   18 18  Temp: 98 F (36.7  C) 98.1 F (36.7 C) 98.6 F (37 C) 98 F (36.7 C)  TempSrc: Oral Oral Oral Oral  SpO2:  99% 97% 99%  Weight:      Height:        Intake/Output Summary (Last 24 hours) at 04/23/2022 1252 Last data filed at 04/23/2022 0955 Gross per 24 hour  Intake 240 ml  Output --  Net 240 ml   Filed Weights   04/22/22 1201  Weight: 70.3 kg    Physical Examination: Body mass index is 23.57 kg/m.   General:  Average built, not in obvious distress HENT:   Mild pallor noted.   Oral mucosa is moist.  Chest:  Clear breath sounds.  Diminished breath sounds bilaterally. No crackles or wheezes.  CABG scar, left leg with scar CVS: S1 &S2 heard. No murmur.  Regular rate and rhythm. Abdomen: Soft, nontender, nondistended.  Bowel sounds are heard.   Extremities: No cyanosis, clubbing or edema.  Peripheral pulses are palpable. Psych: Alert, awake and oriented, normal mood CNS:  No cranial nerve deficits.  Power equal in all extremities.   Skin: Warm and dry.  No rashes noted.  Data Reviewed:   CBC: Recent Labs  Lab 04/22/22 1224 04/22/22 1245 04/23/22 0404  WBC 10.2  --  9.6  NEUTROABS 7.7  --   --   HGB 9.8* 9.5* 7.6*  HCT 29.8* 28.0* 22.6*  MCV 102.4*  --  99.6  PLT 231  --  124    Basic Metabolic Panel: Recent Labs  Lab 04/22/22 1224 04/22/22 1245 04/23/22 0404  NA 133* 131* 134*  K 4.4 4.3 4.2  CL 105 107 106  CO2 18*  --  20*  GLUCOSE 191* 195* 124*  BUN '17 17 21  '$ CREATININE 1.42* 1.20 1.37*  CALCIUM 8.9  --  8.5*    Liver Function Tests: Recent Labs  Lab 04/22/22 1224  AST 14*  ALT 13  ALKPHOS 70  BILITOT 0.6  PROT 5.5*  ALBUMIN 3.0*     Radiology Studies: MR BRAIN WO CONTRAST  Result Date: 04/22/2022 CLINICAL DATA:  Fall last week, lightheadedness. EXAM: MRI HEAD WITHOUT CONTRAST TECHNIQUE: Multiplanar, multiecho pulse sequences of the brain and surrounding structures were obtained without intravenous contrast. COMPARISON:  Same-day noncontrast CT head FINDINGS: Brain: There is no acute intracranial hemorrhage, extra-axial fluid collection, or acute infarct There is moderate background parenchymal volume loss with prominence of the ventricular system and extra-axial CSF spaces. There is a small remote infarct in the right parietal white matter extending to the cortex which is evolved since 2016. A small remote lacunar infarct in the left cerebellar hemisphere is new since 2016. A small area of encephalomalacia in the left insular  region was present in 2016. There is a tiny cortical infarct in the right precentral gyrus, unchanged since 2016. There is a remote cortical infarct in the left occipital lobe, accounting for the finding on the CT head from 1 day priorand unchanged since the MRI from 2016. There is patchy FLAIR signal abnormality in the supratentorial white matter likely reflecting moderate chronic small vessel ischemic change, overall progressed since 2016. No chronic blood products are seen. There is no mass lesion.  There is no mass effect or midline shift. Vascular: The left high cervical and intracranial ICA flow void is absent, unchanged since 2016 and likely occluded. The MCA and ACA flow voids are normal. The other major flow voids are normal. Skull and upper cervical spine: Normal marrow signal.  Sinuses/Orbits: There is complete opacification of the sphenoid sinuses with findings consistent with chronic sinusitis on the prior CT. Bilateral lens implants are in place. The globes and orbits are otherwise unremarkable. Other: There are trace mastoid effusions. IMPRESSION: 1. No acute intracranial pathology. 2. Remote infarct in the left occipital lobe accounting for the finding on the head CT from 1 day prior. 3. Additional small remote infarcts and background chronic small-vessel ischemic changes are overall progressed since 2016 4. Absent left ICA flow void, likely occluded and unchanged since 2016 Electronically Signed   By: Valetta Mole M.D.   On: 04/22/2022 18:33   CT CHEST ABDOMEN PELVIS W CONTRAST  Result Date: 04/22/2022 CLINICAL DATA:  Chest wall pain. Polytrauma, blunt [id number] Trauma [phone] EXAM: CT CHEST, ABDOMEN, AND PELVIS WITH CONTRAST TECHNIQUE: Multidetector CT imaging of the chest, abdomen and pelvis was performed following the standard protocol during bolus administration of intravenous contrast. RADIATION DOSE REDUCTION: This exam was performed according to the departmental dose-optimization program  which includes automated exposure control, adjustment of the mA and/or kV according to patient size and/or use of iterative reconstruction technique. CONTRAST:  14m OMNIPAQUE IOHEXOL 350 MG/ML SOLN COMPARISON:  CT abdomen pelvis 06/08/2020 FINDINGS: CT CHEST FINDINGS Cardiovascular: Heart size within normal limits. No pericardial effusion. Prior sternotomy and CABG. Thoracic aorta is nonaneurysmal. Atherosclerotic calcification of the aorta and coronary arteries. Central pulmonary vasculature is within normal limits. Mediastinum/Nodes: No mediastinal hematoma. No enlarged mediastinal, hilar, or axillary lymph nodes. Thyroid gland, trachea, and esophagus demonstrate no significant findings. Lungs/Pleura: Small bilateral pleural effusions. No airspace consolidation. No pneumothorax. Musculoskeletal: Acute minimally displaced fracture of the anterolateral right seventh rib. Acute minimally displaced fracture of the lateral left eighth rib. Thoracic vertebral body heights are maintained without evidence of fracture. Chronic fractures of the right eighth, tenth, and eleventh ribs. CT ABDOMEN PELVIS FINDINGS Hepatobiliary: No hepatic injury or perihepatic hematoma. Gallbladder is unremarkable. Pancreas: Unremarkable. No pancreatic ductal dilatation or surrounding inflammatory changes. Spleen: Normal in size without focal abnormality. Adrenals/Urinary Tract: No adrenal hemorrhage or renal injury identified. Urinary bladder is moderately distended with mild diffuse wall thickening. Stomach/Bowel: Stomach is within normal limits. Appendix appears normal. No evidence of bowel wall thickening, distention, or inflammatory changes. Vascular/Lymphatic: Aortic atherosclerosis. No enlarged abdominal or pelvic lymph nodes. Reproductive: Prostate is unremarkable. Other: No free fluid. No abdominopelvic fluid collection. No pneumoperitoneum. No abdominal wall hernia. Musculoskeletal: No acute or significant osseous findings. Prior  L4-S1 fusion. Advanced multilevel degenerative disc disease of the lumbar spine. IMPRESSION: 1. Acute minimally displaced fractures of the right seventh and left eighth ribs. No pneumothorax. 2. Small bilateral pleural effusions. 3. No acute traumatic injury within the abdomen or pelvis. 4. Mild diffuse wall thickening of the urinary bladder. Correlate with urinalysis to exclude cystitis. 5. Aortic and coronary artery atherosclerosis (ICD10-I70.0). Electronically Signed   By: NDavina PokeD.O.   On: 04/22/2022 14:01   CT HEAD WO CONTRAST  Result Date: 04/22/2022 CLINICAL DATA:  Trauma EXAM: CT HEAD WITHOUT CONTRAST CT CERVICAL SPINE WITHOUT CONTRAST TECHNIQUE: Multidetector CT imaging of the head and cervical spine was performed following the standard protocol without intravenous contrast. Multiplanar CT image reconstructions of the cervical spine were also generated. RADIATION DOSE REDUCTION: This exam was performed according to the departmental dose-optimization program which includes automated exposure control, adjustment of the mA and/or kV according to patient size and/or use of iterative reconstruction technique. COMPARISON:  MRI Brain 04/04/2015 FINDINGS: CT HEAD FINDINGS Brain: No hemorrhage.  No extra-axial fluid collection. There is sequela of severe chronic microvascular ischemic change with likely subacute, but technically age indeterminate infarcts in the left occipital lobe (series 3, image 15). There is ventriculomegaly, which is likely proportional to the degree of volume loss, which is advanced. Vascular: No hyperdense vessel or unexpected calcification. Skull: Normal. Negative for fracture or focal lesion. Sinuses/Orbits: No acute finding. Other: None. CT CERVICAL SPINE FINDINGS Alignment: There is straightening of the normal cervical lordosis. Skull base and vertebrae: No acute fracture. No primary bone lesion or focal pathologic process. Soft tissues and spinal canal: No prevertebral  fluid or swelling. No visible canal hematoma. Right common carotid artery vascular stent place. Disc levels: There are multilevel degenerative changes with calcified disc bulges at the C3-C4, C4-C5, and C5-C6 level. There is likely moderate to severe spinal canal stenosis at the C4-C5 and C5-C6 vertebral body levels Upper chest: Negative. Other: None IMPRESSION: 1. No CT evidence of intracranial injury. 2. Possibly subacute, but technically age indeterminate infarct in the left occipital lobe. Recommend MRI for further evaluation. 3. No acute cervical spine fracture. Multilevel degenerative changes with likely moderate to severe spinal canal stenosis at the C4-C5 and C5-C6 vertebral body levels. Electronically Signed   By: Marin Roberts M.D.   On: 04/22/2022 13:49   CT CERVICAL SPINE WO CONTRAST  Result Date: 04/22/2022 CLINICAL DATA:  Trauma EXAM: CT HEAD WITHOUT CONTRAST CT CERVICAL SPINE WITHOUT CONTRAST TECHNIQUE: Multidetector CT imaging of the head and cervical spine was performed following the standard protocol without intravenous contrast. Multiplanar CT image reconstructions of the cervical spine were also generated. RADIATION DOSE REDUCTION: This exam was performed according to the departmental dose-optimization program which includes automated exposure control, adjustment of the mA and/or kV according to patient size and/or use of iterative reconstruction technique. COMPARISON:  MRI Brain 04/04/2015 FINDINGS: CT HEAD FINDINGS Brain: No hemorrhage. No extra-axial fluid collection. There is sequela of severe chronic microvascular ischemic change with likely subacute, but technically age indeterminate infarcts in the left occipital lobe (series 3, image 15). There is ventriculomegaly, which is likely proportional to the degree of volume loss, which is advanced. Vascular: No hyperdense vessel or unexpected calcification. Skull: Normal. Negative for fracture or focal lesion. Sinuses/Orbits: No acute  finding. Other: None. CT CERVICAL SPINE FINDINGS Alignment: There is straightening of the normal cervical lordosis. Skull base and vertebrae: No acute fracture. No primary bone lesion or focal pathologic process. Soft tissues and spinal canal: No prevertebral fluid or swelling. No visible canal hematoma. Right common carotid artery vascular stent place. Disc levels: There are multilevel degenerative changes with calcified disc bulges at the C3-C4, C4-C5, and C5-C6 level. There is likely moderate to severe spinal canal stenosis at the C4-C5 and C5-C6 vertebral body levels Upper chest: Negative. Other: None IMPRESSION: 1. No CT evidence of intracranial injury. 2. Possibly subacute, but technically age indeterminate infarct in the left occipital lobe. Recommend MRI for further evaluation. 3. No acute cervical spine fracture. Multilevel degenerative changes with likely moderate to severe spinal canal stenosis at the C4-C5 and C5-C6 vertebral body levels. Electronically Signed   By: Marin Roberts M.D.   On: 04/22/2022 13:49   DG Chest 2 View  Result Date: 04/22/2022 CLINICAL DATA:  Chest wall pain since fall a few days ago. EXAM: CHEST - 2 VIEW COMPARISON:  Chest x-ray dated September 01, 2020. FINDINGS: The heart size and mediastinal contours are within normal limits. Prior CABG. Both lungs are clear. The visualized skeletal  structures are unremarkable. IMPRESSION: 1. No acute cardiopulmonary disease. Electronically Signed   By: Titus Dubin M.D.   On: 04/22/2022 12:46      LOS: 0 days    Flora Lipps, MD [facility] Available via Epic secure chat 7am-7pm After these hours, please refer to coverage provider listed on [email or web address] 04/23/2022, 12:52 PM

## 2022-04-23 NOTE — Hospital Course (Addendum)
Jason [name] is a 71 y.o. male with past medical history significant of CAD s/p CABG 2011, subsequent stents, stroke following ICA stent on chronic DAPT, diabetes mellitus type 2 presented to the hospital  after being lightheaded while standing and his knees gave up but there was no loss of consciousness.  He also reported 4 large bloody bowel movements which were new for him.  In the ED, patient was noted to have hematochezia.  He also complained of lower rib pain after the fall.  In the ED, blood pressure was stable.  Hemoglobin was 9.8.  Sodium level was 131.  Occult blood was positive.  UA showed some leukocytes with white cells but nitrites were negative.  Patient was then considered for admission to hospital for further evaluation and treatment.

## 2022-04-23 NOTE — Care Management Obs Status (Signed)
[place] NOTIFICATION   Patient Details  Name: Jason [name] MRN: [id number] Date of Birth: 1950/07/29   Medicare Observation Status Notification Given:  Yes    Carles Collet, RN 04/23/2022, 3:14 PM

## 2022-04-24 ENCOUNTER — Observation Stay (HOSPITAL_COMMUNITY): Payer: Medicare Other | Admitting: Anesthesiology

## 2022-04-24 ENCOUNTER — Other Ambulatory Visit: Payer: Self-pay | Admitting: Cardiovascular Disease

## 2022-04-24 ENCOUNTER — Observation Stay (HOSPITAL_BASED_OUTPATIENT_CLINIC_OR_DEPARTMENT_OTHER): Payer: Medicare Other | Admitting: Anesthesiology

## 2022-04-24 ENCOUNTER — Encounter (HOSPITAL_COMMUNITY)
Admission: EM | Disposition: A | Discharge status: Home / Self Care | Payer: Self-pay | Source: Home / Self Care | Attending: Internal Medicine

## 2022-04-24 ENCOUNTER — Encounter (HOSPITAL_COMMUNITY): Payer: Self-pay | Admitting: Internal Medicine

## 2022-04-24 DIAGNOSIS — D62 Acute posthemorrhagic anemia: Secondary | ICD-10-CM | POA: Diagnosis not present

## 2022-04-24 DIAGNOSIS — E1151 Type 2 diabetes mellitus with diabetic peripheral angiopathy without gangrene: Secondary | ICD-10-CM | POA: Diagnosis not present

## 2022-04-24 DIAGNOSIS — Z7984 Long term (current) use of oral hypoglycemic drugs: Secondary | ICD-10-CM

## 2022-04-24 DIAGNOSIS — I25118 Atherosclerotic heart disease of native coronary artery with other forms of angina pectoris: Secondary | ICD-10-CM | POA: Diagnosis not present

## 2022-04-24 DIAGNOSIS — K921 Melena: Secondary | ICD-10-CM | POA: Diagnosis not present

## 2022-04-24 DIAGNOSIS — K269 Duodenal ulcer, unspecified as acute or chronic, without hemorrhage or perforation: Secondary | ICD-10-CM

## 2022-04-24 DIAGNOSIS — K640 First degree hemorrhoids: Secondary | ICD-10-CM

## 2022-04-24 DIAGNOSIS — K2970 Gastritis, unspecified, without bleeding: Secondary | ICD-10-CM | POA: Diagnosis not present

## 2022-04-24 DIAGNOSIS — K2971 Gastritis, unspecified, with bleeding: Secondary | ICD-10-CM

## 2022-04-24 DIAGNOSIS — S2249XA Multiple fractures of ribs, unspecified side, initial encounter for closed fracture: Secondary | ICD-10-CM | POA: Diagnosis not present

## 2022-04-24 DIAGNOSIS — F17200 Nicotine dependence, unspecified, uncomplicated: Secondary | ICD-10-CM

## 2022-04-24 HISTORY — PX: BIOPSY: SHX5522

## 2022-04-24 HISTORY — PX: COLONOSCOPY WITH PROPOFOL: SHX5780

## 2022-04-24 HISTORY — PX: ESOPHAGOGASTRODUODENOSCOPY (EGD) WITH PROPOFOL: SHX5813

## 2022-04-24 LAB — GLUCOSE, CAPILLARY
Glucose-Capillary: 127 mg/dL — ABNORMAL HIGH (ref 70–99)
Glucose-Capillary: 139 mg/dL — ABNORMAL HIGH (ref 70–99)
Glucose-Capillary: 134 mg/dL — ABNORMAL HIGH (ref 70–99)

## 2022-04-24 LAB — CBC
HCT: 20.9 % — ABNORMAL LOW (ref 39.0–52.0)
Hemoglobin: 6.8 g/dL — CL (ref 13.0–17.0)
MCH: 33 pg (ref 26.0–34.0)
MCHC: 32.5 g/dL (ref 30.0–36.0)
MCV: 101.5 fL — ABNORMAL HIGH (ref 80.0–100.0)
Platelets: 186 10*3/uL (ref 150–400)
RBC: 2.06 MIL/uL — ABNORMAL LOW (ref 4.22–5.81)
RDW: 14.2 % (ref 11.5–15.5)
WBC: 9.9 10*3/uL (ref 4.0–10.5)
nRBC: 0 % (ref 0.0–0.2)

## 2022-04-24 LAB — PREPARE RBC (CROSSMATCH)

## 2022-04-24 LAB — BASIC METABOLIC PANEL
Anion gap: 8 (ref 5–15)
BUN: 20 mg/dL (ref 8–23)
CO2: 22 mmol/L (ref 22–32)
Calcium: 8.5 mg/dL — ABNORMAL LOW (ref 8.9–10.3)
Chloride: 106 mmol/L (ref 98–111)
Creatinine, Ser: 1.34 mg/dL — ABNORMAL HIGH (ref 0.61–1.24)
GFR, Estimated: 57 mL/min — ABNORMAL LOW (ref >60)
Glucose, Bld: 108 mg/dL — ABNORMAL HIGH (ref 70–99)
Potassium: 4 mmol/L (ref 3.5–5.1)
Sodium: 136 mmol/L (ref 135–145)

## 2022-04-24 LAB — HEMOGLOBIN AND HEMATOCRIT, BLOOD
HCT: 26.4 % — ABNORMAL LOW (ref 39.0–52.0)
Hemoglobin: 8.8 g/dL — ABNORMAL LOW (ref 13.0–17.0)

## 2022-04-24 LAB — MAGNESIUM: Magnesium: 1.9 mg/dL (ref 1.7–2.4)

## 2022-04-24 SURGERY — ESOPHAGOGASTRODUODENOSCOPY (EGD) WITH PROPOFOL
Anesthesia: Monitor Anesthesia Care

## 2022-04-24 MED ORDER — LIDOCAINE 2% (20 MG/ML) 5 ML SYRINGE
INTRAMUSCULAR | Status: DC | PRN
  Administered 2022-04-24: 40 mg via INTRAVENOUS

## 2022-04-24 MED ORDER — NICOTINE 21 MG/24HR TD PT24
21.0000 mg | MEDICATED_PATCH | Freq: Every day | TRANSDERMAL | Status: DC
  Administered 2022-04-24 – 2022-04-26 (×3): 21 mg via TRANSDERMAL
  Filled 2022-04-24 (×3): qty 1

## 2022-04-24 MED ORDER — PROPOFOL 500 MG/50ML IV EMUL
INTRAVENOUS | Status: DC | PRN
  Administered 2022-04-24: 70 ug/kg/min via INTRAVENOUS

## 2022-04-24 MED ORDER — PROPOFOL 10 MG/ML IV BOLUS
INTRAVENOUS | Status: DC | PRN
  Administered 2022-04-24: 40 mg via INTRAVENOUS

## 2022-04-24 MED ORDER — PANTOPRAZOLE SODIUM 40 MG IV SOLR
40.0000 mg | Freq: Two times a day (BID) | INTRAVENOUS | Status: DC
  Administered 2022-04-24 – 2022-04-26 (×5): 40 mg via INTRAVENOUS
  Filled 2022-04-24 (×5): qty 10

## 2022-04-24 MED ORDER — LACTATED RINGERS IV SOLN: INTRAVENOUS | Status: DC

## 2022-04-24 MED ORDER — SODIUM CHLORIDE 0.9% IV SOLUTION: Freq: Once | INTRAVENOUS | Status: AC

## 2022-04-24 MED ORDER — PHENYLEPHRINE 80 MCG/ML (10ML) SYRINGE FOR IV PUSH (FOR BLOOD PRESSURE SUPPORT)
PREFILLED_SYRINGE | INTRAVENOUS | Status: DC | PRN
  Administered 2022-04-24 (×3): 80 ug via INTRAVENOUS

## 2022-04-24 SURGICAL SUPPLY — 25 items

## 2022-04-24 NOTE — Progress Notes (Signed)
PROGRESS NOTE    Jason [name]  XBL:[id number] DOB: 01-28-1951 DOA: 04/22/2022 PCP: Marrian Salvage, FNP    Brief Narrative:  Jason [name] is a 71 y.o. male with past medical history significant of CAD s/p CABG 2011, subsequent stents, stroke following ICA stent on chronic DAPT, diabetes mellitus type 2 presented to the hospital  after being lightheaded while standing and his knees gave up but there was no loss of consciousness.  He also reported 4 large bloody bowel movements which were new for him.   In the ED, patient was noted to have hematochezia.  He also complained of lower rib pain from recent fall.  Initial blood pressure was stable.  Hemoglobin was 9.8.  Sodium level was 131.  Occult blood was positive.  UA showed some leukocytes with white cells but nitrites were negative.  Patient was then considered for admission to hospital for further evaluation and treatment.  Patient, patient has been seen by GI.  DAPT on hold.  Plan for EGD colonoscopy evaluation 11--02-23  Assessment and Plan:  Principal Problem:   Hematochezia Active Problems:   ABLA (acute blood loss anemia)   Rib fractures   DM (diabetes mellitus) (HCC)   HTN (hypertension)   Hyperlipidemia   CAD, CABG Feb 2011, cath x 4 since-medical Rx   Hypothyroidism   *Painless hematochezia Possibility of diverticular bleeding.  Patient was on DAPT as outpatient and briefly had NSAIDs.  Continue to hold.  Continue Protonix.  Plan for EGD colonoscopy on 04/24/2022.  Hemoglobin today at 6.8.  We will transfuse 1 unit of packed RBC.  acute blood loss anemia secondary to GI bleed. Current hemoglobin at 6.8.  Transfuse for ongoing bleeding/hemoglobin less than 7.   We will give 1 unit of packed RBC this morning.  Hemoglobin 1 year back was 12.6.   Rib fractures Continue supportive care, Percocet, incentive spirometry.  Continue pain control.  Avoid NSAIDs.  History of CAD, CABG Feb 2011, cath x 4, peripheral  vascular disease. History of intervention to the lower extremities and right great toe amputation.  History of CVA with ICA stent.  Continue to hold dual antiplatelets in the setting of GI bleed with acute blood loss anemia.  Continue Ranexa.  Continue Imdur, statins.   Hyperlipidemia Continue Statin   HTN (hypertension) Continue Imdur   DM (diabetes mellitus)  Continue moderate risk of sliding scale insulin, Accu-Cheks, Jardiance.  Latest hemoglobin A1c of 7.0.  Hypothyroidism.  Continue Synthroid.  Recent weakness and fall.  We will get PT evaluation after GI stabilization.     DVT prophylaxis: SCDs Start: 04/22/22 2140   Code Status:     Code Status: Full Code  Disposition: Home  Status is: Observation  The patient will require care spanning > 2 midnights and should be moved to inpatient because: Need for endoscopic evaluation, significant anemia requiring PRBC transfusion,   Family Communication: None at present  Consultants:  GI  Procedures:  PRBC transfusion  Antimicrobials:  None  Anti-infectives (From admission, onward)    None      Subjective: Today, seen and examined at bedside.  Has been undergoing colon preparation with some bright red blood which is starting to clear up.  Denies any dizziness lightheadedness shortness of breath chest pain.  Objective: Vitals:   04/23/22 1717 04/23/22 2201 04/24/22 0515 04/24/22 0550  BP: (!) 88/73 101/71 (!) 99/54 (!) 105/50  Pulse: 81 90 97 78  Resp: '17 20 18 18  '$ Temp:  98.1 F (36.7 C) (!) 97.4 F (36.3 C) 98.2 F (36.8 C) 97.7 F (36.5 C)  TempSrc: Oral Oral Oral   SpO2: 100% 91% (P) 98%   Weight:      Height:        Intake/Output Summary (Last 24 hours) at 04/24/2022 0752 Last data filed at 04/23/2022 1723 Gross per 24 hour  Intake 480 ml  Output --  Net 480 ml    Filed Weights   04/22/22 1201  Weight: 70.3 kg    Physical Examination: Body mass index is 23.57 kg/m.   General:  Average  built, not in obvious distress HENT: Mild pallor noted.  Oral mucosa is moist.  Chest:  Clear breath sounds.  Diminished breath sounds bilaterally. No crackles or wheezes.  CABG is clear, left leg with scar. CVS: S1 &S2 heard. No murmur.  Regular rate and rhythm. Abdomen: Soft, nontender, nondistended.  Bowel sounds are heard.   Extremities: No cyanosis, clubbing or edema.  Peripheral pulses are palpable. Psych: Alert, awake and oriented, normal mood CNS:  No cranial nerve deficits.  Power equal in all extremities.   Skin: Warm and dry.  No rashes noted.   Data Reviewed:   CBC: Recent Labs  Lab 04/22/22 1224 04/22/22 1245 04/23/22 0404 04/24/22 0313  WBC 10.2  --  9.6 9.9  NEUTROABS 7.7  --   --   --   HGB 9.8* 9.5* 7.6* 6.8*  HCT 29.8* 28.0* 22.6* 20.9*  MCV 102.4*  --  99.6 101.5*  PLT 231  --  204 186     Basic Metabolic Panel: Recent Labs  Lab 04/22/22 1224 04/22/22 1245 04/23/22 0404 04/24/22 0313  NA 133* 131* 134* 136  K 4.4 4.3 4.2 4.0  CL 105 107 106 106  CO2 18*  --  20* 22  GLUCOSE 191* 195* 124* 108*  BUN '17 17 21 20  '$ CREATININE 1.42* 1.20 1.37* 1.34*  CALCIUM 8.9  --  8.5* 8.5*  MG  --   --   --  1.9     Liver Function Tests: Recent Labs  Lab 04/22/22 1224  AST 14*  ALT 13  ALKPHOS 70  BILITOT 0.6  PROT 5.5*  ALBUMIN 3.0*      Radiology Studies: MR BRAIN WO CONTRAST  Result Date: 04/22/2022 CLINICAL DATA:  Fall last week, lightheadedness. EXAM: MRI HEAD WITHOUT CONTRAST TECHNIQUE: Multiplanar, multiecho pulse sequences of the brain and surrounding structures were obtained without intravenous contrast. COMPARISON:  Same-day noncontrast CT head FINDINGS: Brain: There is no acute intracranial hemorrhage, extra-axial fluid collection, or acute infarct There is moderate background parenchymal volume loss with prominence of the ventricular system and extra-axial CSF spaces. There is a small remote infarct in the right parietal white matter  extending to the cortex which is evolved since 2016. A small remote lacunar infarct in the left cerebellar hemisphere is new since 2016. A small area of encephalomalacia in the left insular region was present in 2016. There is a tiny cortical infarct in the right precentral gyrus, unchanged since 2016. There is a remote cortical infarct in the left occipital lobe, accounting for the finding on the CT head from 1 day priorand unchanged since the MRI from 2016. There is patchy FLAIR signal abnormality in the supratentorial white matter likely reflecting moderate chronic small vessel ischemic change, overall progressed since 2016. No chronic blood products are seen. There is no mass lesion.  There is no mass effect or midline shift.  Vascular: The left high cervical and intracranial ICA flow void is absent, unchanged since 2016 and likely occluded. The MCA and ACA flow voids are normal. The other major flow voids are normal. Skull and upper cervical spine: Normal marrow signal. Sinuses/Orbits: There is complete opacification of the sphenoid sinuses with findings consistent with chronic sinusitis on the prior CT. Bilateral lens implants are in place. The globes and orbits are otherwise unremarkable. Other: There are trace mastoid effusions. IMPRESSION: 1. No acute intracranial pathology. 2. Remote infarct in the left occipital lobe accounting for the finding on the head CT from 1 day prior. 3. Additional small remote infarcts and background chronic small-vessel ischemic changes are overall progressed since 2016 4. Absent left ICA flow void, likely occluded and unchanged since 2016 Electronically Signed   By: Valetta Mole M.D.   On: 04/22/2022 18:33   CT CHEST ABDOMEN PELVIS W CONTRAST  Result Date: 04/22/2022 CLINICAL DATA:  Chest wall pain. Polytrauma, blunt [id number] Trauma [phone] EXAM: CT CHEST, ABDOMEN, AND PELVIS WITH CONTRAST TECHNIQUE: Multidetector CT imaging of the chest, abdomen and pelvis was performed  following the standard protocol during bolus administration of intravenous contrast. RADIATION DOSE REDUCTION: This exam was performed according to the departmental dose-optimization program which includes automated exposure control, adjustment of the mA and/or kV according to patient size and/or use of iterative reconstruction technique. CONTRAST:  25m OMNIPAQUE IOHEXOL 350 MG/ML SOLN COMPARISON:  CT abdomen pelvis 06/08/2020 FINDINGS: CT CHEST FINDINGS Cardiovascular: Heart size within normal limits. No pericardial effusion. Prior sternotomy and CABG. Thoracic aorta is nonaneurysmal. Atherosclerotic calcification of the aorta and coronary arteries. Central pulmonary vasculature is within normal limits. Mediastinum/Nodes: No mediastinal hematoma. No enlarged mediastinal, hilar, or axillary lymph nodes. Thyroid gland, trachea, and esophagus demonstrate no significant findings. Lungs/Pleura: Small bilateral pleural effusions. No airspace consolidation. No pneumothorax. Musculoskeletal: Acute minimally displaced fracture of the anterolateral right seventh rib. Acute minimally displaced fracture of the lateral left eighth rib. Thoracic vertebral body heights are maintained without evidence of fracture. Chronic fractures of the right eighth, tenth, and eleventh ribs. CT ABDOMEN PELVIS FINDINGS Hepatobiliary: No hepatic injury or perihepatic hematoma. Gallbladder is unremarkable. Pancreas: Unremarkable. No pancreatic ductal dilatation or surrounding inflammatory changes. Spleen: Normal in size without focal abnormality. Adrenals/Urinary Tract: No adrenal hemorrhage or renal injury identified. Urinary bladder is moderately distended with mild diffuse wall thickening. Stomach/Bowel: Stomach is within normal limits. Appendix appears normal. No evidence of bowel wall thickening, distention, or inflammatory changes. Vascular/Lymphatic: Aortic atherosclerosis. No enlarged abdominal or pelvic lymph nodes. Reproductive:  Prostate is unremarkable. Other: No free fluid. No abdominopelvic fluid collection. No pneumoperitoneum. No abdominal wall hernia. Musculoskeletal: No acute or significant osseous findings. Prior L4-S1 fusion. Advanced multilevel degenerative disc disease of the lumbar spine. IMPRESSION: 1. Acute minimally displaced fractures of the right seventh and left eighth ribs. No pneumothorax. 2. Small bilateral pleural effusions. 3. No acute traumatic injury within the abdomen or pelvis. 4. Mild diffuse wall thickening of the urinary bladder. Correlate with urinalysis to exclude cystitis. 5. Aortic and coronary artery atherosclerosis (ICD10-I70.0). Electronically Signed   By: NDavina PokeD.O.   On: 04/22/2022 14:01   CT HEAD WO CONTRAST  Result Date: 04/22/2022 CLINICAL DATA:  Trauma EXAM: CT HEAD WITHOUT CONTRAST CT CERVICAL SPINE WITHOUT CONTRAST TECHNIQUE: Multidetector CT imaging of the head and cervical spine was performed following the standard protocol without intravenous contrast. Multiplanar CT image reconstructions of the cervical spine were also generated. RADIATION DOSE REDUCTION: This exam  was performed according to the departmental dose-optimization program which includes automated exposure control, adjustment of the mA and/or kV according to patient size and/or use of iterative reconstruction technique. COMPARISON:  MRI Brain 04/04/2015 FINDINGS: CT HEAD FINDINGS Brain: No hemorrhage. No extra-axial fluid collection. There is sequela of severe chronic microvascular ischemic change with likely subacute, but technically age indeterminate infarcts in the left occipital lobe (series 3, image 15). There is ventriculomegaly, which is likely proportional to the degree of volume loss, which is advanced. Vascular: No hyperdense vessel or unexpected calcification. Skull: Normal. Negative for fracture or focal lesion. Sinuses/Orbits: No acute finding. Other: None. CT CERVICAL SPINE FINDINGS Alignment: There is  straightening of the normal cervical lordosis. Skull base and vertebrae: No acute fracture. No primary bone lesion or focal pathologic process. Soft tissues and spinal canal: No prevertebral fluid or swelling. No visible canal hematoma. Right common carotid artery vascular stent place. Disc levels: There are multilevel degenerative changes with calcified disc bulges at the C3-C4, C4-C5, and C5-C6 level. There is likely moderate to severe spinal canal stenosis at the C4-C5 and C5-C6 vertebral body levels Upper chest: Negative. Other: None IMPRESSION: 1. No CT evidence of intracranial injury. 2. Possibly subacute, but technically age indeterminate infarct in the left occipital lobe. Recommend MRI for further evaluation. 3. No acute cervical spine fracture. Multilevel degenerative changes with likely moderate to severe spinal canal stenosis at the C4-C5 and C5-C6 vertebral body levels. Electronically Signed   By: Marin Roberts M.D.   On: 04/22/2022 13:49   CT CERVICAL SPINE WO CONTRAST  Result Date: 04/22/2022 CLINICAL DATA:  Trauma EXAM: CT HEAD WITHOUT CONTRAST CT CERVICAL SPINE WITHOUT CONTRAST TECHNIQUE: Multidetector CT imaging of the head and cervical spine was performed following the standard protocol without intravenous contrast. Multiplanar CT image reconstructions of the cervical spine were also generated. RADIATION DOSE REDUCTION: This exam was performed according to the departmental dose-optimization program which includes automated exposure control, adjustment of the mA and/or kV according to patient size and/or use of iterative reconstruction technique. COMPARISON:  MRI Brain 04/04/2015 FINDINGS: CT HEAD FINDINGS Brain: No hemorrhage. No extra-axial fluid collection. There is sequela of severe chronic microvascular ischemic change with likely subacute, but technically age indeterminate infarcts in the left occipital lobe (series 3, image 15). There is ventriculomegaly, which is likely proportional to  the degree of volume loss, which is advanced. Vascular: No hyperdense vessel or unexpected calcification. Skull: Normal. Negative for fracture or focal lesion. Sinuses/Orbits: No acute finding. Other: None. CT CERVICAL SPINE FINDINGS Alignment: There is straightening of the normal cervical lordosis. Skull base and vertebrae: No acute fracture. No primary bone lesion or focal pathologic process. Soft tissues and spinal canal: No prevertebral fluid or swelling. No visible canal hematoma. Right common carotid artery vascular stent place. Disc levels: There are multilevel degenerative changes with calcified disc bulges at the C3-C4, C4-C5, and C5-C6 level. There is likely moderate to severe spinal canal stenosis at the C4-C5 and C5-C6 vertebral body levels Upper chest: Negative. Other: None IMPRESSION: 1. No CT evidence of intracranial injury. 2. Possibly subacute, but technically age indeterminate infarct in the left occipital lobe. Recommend MRI for further evaluation. 3. No acute cervical spine fracture. Multilevel degenerative changes with likely moderate to severe spinal canal stenosis at the C4-C5 and C5-C6 vertebral body levels. Electronically Signed   By: Marin Roberts M.D.   On: 04/22/2022 13:49   DG Chest 2 View  Result Date: 04/22/2022 CLINICAL DATA:  Chest  wall pain since fall a few days ago. EXAM: CHEST - 2 VIEW COMPARISON:  Chest x-ray dated September 01, 2020. FINDINGS: The heart size and mediastinal contours are within normal limits. Prior CABG. Both lungs are clear. The visualized skeletal structures are unremarkable. IMPRESSION: 1. No acute cardiopulmonary disease. Electronically Signed   By: Titus Dubin M.D.   On: 04/22/2022 12:46      LOS: 0 days    Flora Lipps, MD [facility] Available via Epic secure chat 7am-7pm After these hours, please refer to coverage provider listed on [email or web address] 04/24/2022, 7:52 AM

## 2022-04-24 NOTE — Anesthesia Preprocedure Evaluation (Addendum)
Anesthesia Evaluation  Patient identified by MRN, date of birth, ID band Patient awake    Reviewed: Allergy & Precautions, H&P , NPO status , Patient's Chart, lab work & pertinent test results  Airway Mallampati: II  TM Distance: >3 FB Neck ROM: Full    Dental no notable dental hx. (+) Edentulous Upper, Edentulous Lower, Dental Advisory Given   Pulmonary COPD,  COPD inhaler, Current Smoker and Patient abstained from smoking.   Pulmonary exam normal breath sounds clear to auscultation       Cardiovascular hypertension, Pt. on medications + CAD, + Past MI, + Cardiac Stents, + CABG, + Peripheral Vascular Disease and +CHF   Rhythm:Regular Rate:Normal     Neuro/Psych   Anxiety Depression    negative neurological ROS     GI/Hepatic Neg liver ROS,GERD  ,,  Endo/Other  diabetes, Type 2, Oral Hypoglycemic AgentsHypothyroidism    Renal/GU Renal disease  negative genitourinary   Musculoskeletal  (+) Arthritis , Osteoarthritis,    Abdominal   Peds  Hematology  (+) Blood dyscrasia, anemia   Anesthesia Other Findings   Reproductive/Obstetrics negative OB ROS                             Anesthesia Physical Anesthesia Plan  ASA: 4  Anesthesia Plan: MAC   Post-op Pain Management: Minimal or no pain anticipated and Ketamine IV*   Induction: Intravenous  PONV Risk Score and Plan: 0 and Propofol infusion  Airway Management Planned: Natural Airway and Nasal Cannula  Additional Equipment:   Intra-op Plan:   Post-operative Plan:   Informed Consent: I have reviewed the patients History and Physical, chart, labs and discussed the procedure including the risks, benefits and alternatives for the proposed anesthesia with the patient or authorized representative who has indicated his/her understanding and acceptance.     Dental advisory given  Plan Discussed with: CRNA  Anesthesia Plan Comments:         Anesthesia Quick Evaluation

## 2022-04-24 NOTE — Transfer of Care (Signed)
Immediate Anesthesia Transfer of Care Note  Patient: Jason [name]  Procedure(s) Performed: ESOPHAGOGASTRODUODENOSCOPY (EGD) WITH PROPOFOL COLONOSCOPY WITH PROPOFOL BIOPSY  Patient Location: PACU  Anesthesia Type:MAC  Level of Consciousness: drowsy  Airway & Oxygen Therapy: Patient Spontanous Breathing  Post-op Assessment: Report given to RN and Post -op Vital signs reviewed and stable  Post vital signs: Reviewed and stable  Last Vitals:  Vitals Value Taken Time  BP 107/66 04/24/22 1327  Temp    Pulse 69 04/24/22 1328  Resp 27 04/24/22 1328  SpO2 96 % 04/24/22 1328  Vitals shown include unvalidated device data.  Last Pain:  Vitals:   04/24/22 1158  TempSrc: Tympanic  PainSc: 5          Complications: No notable events documented.

## 2022-04-24 NOTE — Op Note (Signed)
[facility] Patient Name: Jason [name] Procedure Date : 04/24/2022 MRN: [id number] Attending MD: [name] , MD, [id number] Date of Birth: May 09, 1951 CSN: [id number] Age: 71 Admit Type: Inpatient Procedure:                Upper GI endoscopy Indications:              Painless hematochezia. HD stable. Upper vs lower GI                            bleed. Hb 12 (baseline) to 9.5. Pt on plavix/ASA                            (last dose 10/31) and NSAIDs. Neg CT AP today. Last                            colon Dec 2021 with several small tubular                            adenomas.Last EGD Dec 2021 with Schatzki's ring s/p                            dil. Providers:                [name], MD, Velva Harman, RN, Fransico Setters                            [place], Technician Referring MD:              Medicines:                Monitored Anesthesia Care Complications:            No immediate complications. Estimated Blood Loss:     Estimated blood loss: none. Procedure:                Pre-Anesthesia Assessment:                           - Prior to the procedure, a History and Physical                            was performed, and patient medications and                            allergies were reviewed. The patient's tolerance of                            previous anesthesia was also reviewed. The risks                            and benefits of the procedure and the sedation                            options and risks were discussed with the patient.  All questions were answered, and informed consent                            was obtained. Prior Anticoagulants: The patient has                            taken Plavix (clopidogrel), last dose was 2 days                            prior to procedure. ASA Grade Assessment: III - A                            patient with severe systemic disease. After                            reviewing the risks and  benefits, the patient was                            deemed in satisfactory condition to undergo the                            procedure.                           After obtaining informed consent, the endoscope was                            passed under direct vision. Throughout the                            procedure, the patient's blood pressure, pulse, and                            oxygen saturations were monitored continuously. The                            [id number]-[id number] ([id number]) Olympus endoscope was introduced                            through the mouth, and advanced to the second part                            of duodenum. The upper GI endoscopy was                            accomplished without difficulty. The patient                            tolerated the procedure well. Scope In: Scope Out: Findings:      The examined esophagus was normal with well-defined Z-line, examined by       NBI.      Localized mild inflammation characterized by erythema was found in the       gastric antrum. 2 Biopsies were taken with a cold forceps  to r/o HP.      One large deep 2 cm non-bleeding cratered duodenal ulcer with a clean       ulcer base (Forrest Class III) with surrounding edema was found in the       duodenal bulb. This did deform antrum and duodenum. No outlet       obstruction. The second part of the duodenum was normal. Impression:               - Large duodenal ulcer with a clean ulcer base                            (Forrest Class III).                           - Mild gastritis.                           - Deformed antrum/duodenum without outlet                            obstruction.                           - Given neg colonoscopy and above findings, he                            likely had brisk UGI bleeding. Recommendation:           - Full liquid diet.                           - IV protonix 40 Q12 hrs. In next 24-48hrs,                            transition to  Protonix 40 mg BID x 12 weeks, then                            QD indefinitely.                           - Await pathology results.                           - No ibuprofen, naproxen, or other non-steroidal                            anti-inflammatory drugs STRICTLY.                           - Resume plavix in 3 days. Is it possible to give                            him monotherapy? Atleast x next 8 weeks.                           - The findings and recommendations were discussed  with the patient.                           - FU GI in 6-8 weeks. I would suggest repeat EGD in                            12 weeks to ensure complete healing of the ulcer                            and to make sure he has not developed duodenal                            stenosis which can require dil. Procedure Code(s):        --- Professional ---                           [phone], Esophagogastroduodenoscopy, flexible,                            transoral; with biopsy, single or multiple Diagnosis Code(s):        --- Professional ---                           K29.70, Gastritis, unspecified, without bleeding                           K26.9, Duodenal ulcer, unspecified as acute or                            chronic, without hemorrhage or perforation                           K92.1, Melena (includes Hematochezia) CPT copyright 2022 [employer]. All rights reserved. The codes documented in this report are preliminary and upon coder review may  be revised to meet current compliance requirements. [name], MD 04/24/2022 1:49:14 PM This report has been signed electronically. Number of Addenda: 0

## 2022-04-24 NOTE — Anesthesia Postprocedure Evaluation (Signed)
Anesthesia Post Note  Patient: Jason [name]  Procedure(s) Performed: ESOPHAGOGASTRODUODENOSCOPY (EGD) WITH PROPOFOL COLONOSCOPY WITH PROPOFOL BIOPSY     Patient location during evaluation: Endoscopy Anesthesia Type: MAC Level of consciousness: awake Pain management: pain level controlled Vital Signs Assessment: post-procedure vital signs reviewed and stable Respiratory status: spontaneous breathing, nonlabored ventilation, respiratory function stable and patient connected to nasal cannula oxygen Cardiovascular status: stable and blood pressure returned to baseline Postop Assessment: no apparent nausea or vomiting Anesthetic complications: no  No notable events documented.  Last Vitals:  Vitals:   04/24/22 1330 04/24/22 1341  BP: 101/62 105/81  Pulse: 68 74  Resp: (!) 26 (!) 28  Temp:  36.6 C  SpO2: 96% 97%    Last Pain:  Vitals:   04/24/22 1341  TempSrc:   PainSc: 0-No pain                 Whittney Steenson,W. EDMOND

## 2022-04-24 NOTE — Progress Notes (Signed)
CRITICAL VALUE STICKER  CRITICAL VALUE: hgb 6.8  RECEIVER (on-site recipient of call): T.Lemar Bakos, RN  DATE & TIME NOTIFIED: 04/24/2022 0430  MESSENGER (representative from lab): Silva Bandy  MD NOTIFIED: Gershon Cull, NP  TIME OF NOTIFICATION: 8786  RESPONSE: see new orders

## 2022-04-24 NOTE — Interval H&P Note (Signed)
History and Physical Interval Note:  04/24/2022 12:07 PM  Jason [name]  has presented today for surgery, with the diagnosis of Hematochezia.  Blood loss anemia..  The various methods of treatment have been discussed with the patient and family. After consideration of risks, benefits and other options for treatment, the patient has consented to  Procedure(s): ESOPHAGOGASTRODUODENOSCOPY (EGD) WITH PROPOFOL (N/A) COLONOSCOPY WITH PROPOFOL (N/A) as a surgical intervention.  The patient's history has been reviewed, patient examined, no change in status, stable for surgery.  I have reviewed the patient's chart and labs.  Questions were answered to the patient's satisfaction.     [name]

## 2022-04-24 NOTE — Anesthesia Procedure Notes (Signed)
Procedure Name: MAC Date/Time: 04/24/2022 12:48 PM  Performed by: Lorie Phenix, CRNAPre-anesthesia Checklist: Patient identified, Emergency Drugs available, Suction available and Patient being monitored Oxygen Delivery Method: Nasal cannula Preoxygenation: Pre-oxygenation with 100% oxygen Placement Confirmation: positive ETCO2

## 2022-04-24 NOTE — Op Note (Signed)
[facility] Patient Name: Jason [name] Procedure Date : 04/24/2022 MRN: [id number] Attending MD: [name] , MD, [id number] Date of Birth: [birth year] CSN: [id number] Age: 71 Admit Type: Inpatient Procedure:                Colonoscopy Indications:              Hematochezia. H/O polyps. Providers:                [name], MD, Velva Harman, RN, Fransico Setters                            Mbumina, Technician Referring MD:              Medicines:                Monitored Anesthesia Care Complications:            No immediate complications. Estimated Blood Loss:     Estimated blood loss: none. Procedure:                Pre-Anesthesia Assessment:                           - Prior to the procedure, a History and Physical                            was performed, and patient medications and                            allergies were reviewed. The patient's tolerance of                            previous anesthesia was also reviewed. The risks                            and benefits of the procedure and the sedation                            options and risks were discussed with the patient.                            All questions were answered, and informed consent                            was obtained. Prior Anticoagulants: The patient has                            taken Plavix (clopidogrel), last dose was 2 days                            prior to procedure. ASA Grade Assessment: III - A                            patient with severe systemic disease. After  reviewing the risks and benefits, the patient was                            deemed in satisfactory condition to undergo the                            procedure.                           After obtaining informed consent, the colonoscope                            was passed under direct vision. Throughout the                            procedure, the patient's blood pressure, pulse, and                             oxygen saturations were monitored continuously. The                            [id number]-HQ190TL ([id number]) Olympus peds colonoscope was                            introduced through the anus and advanced to the 2                            cm into the ileum. The colonoscopy was performed                            without difficulty. The patient tolerated the                            procedure well. The quality of the bowel                            preparation was good. The terminal ileum, ileocecal                            valve, appendiceal orifice, and rectum were                            photographed. Scope In: 1:07:49 PM Scope Out: 1:21:28 PM Scope Withdrawal Time: 0 hours 10 minutes 13 seconds  Total Procedure Duration: 0 hours 13 minutes 39 seconds  Findings:      The colon (entire examined portion) appeared normal. No significant       diverticulosis.      Non-bleeding internal hemorrhoids were found during retroflexion. The       hemorrhoids were small and Grade I (internal hemorrhoids that do not       prolapse).      The terminal ileum appeared normal.      The exam was otherwise without abnormality on direct and retroflexion       views. Impression:               -  The entire examined colon is normal.                           - Non-bleeding internal hemorrhoids.                           - The examined portion of the ileum was normal.                           - The examination was otherwise normal on direct                            and retroflexion views.                           - No specimens collected. Recommendation:           - Patient has a contact number available for                            emergencies. The signs and symptoms of potential                            delayed complications were discussed with the                            patient. Return to normal activities tomorrow.                            Written discharge  instructions were provided to the                            patient.                           - Full liquid diet.                           - Continue present medications.                           - Repeat colonoscopy is not recommended due to                            current age (90 years or older) for screening                            purposes. Hence repeat colonoscopy only if with any                            new problems.                           - The findings and recommendations were discussed  with the patient.                           Note that patient likely had a brisk upper GI                            bleeding. Procedure Code(s):        --- Professional ---                           [phone], Colonoscopy, flexible; diagnostic, including                            collection of specimen(s) by brushing or washing,                            when performed (separate procedure) Diagnosis Code(s):        --- Professional ---                           K64.0, First degree hemorrhoids                           K92.1, Melena (includes Hematochezia) CPT copyright 2022 [employer]. All rights reserved. The codes documented in this report are preliminary and upon coder review may  be revised to meet current compliance requirements. [name], MD 04/24/2022 1:37:06 PM This report has been signed electronically. Number of Addenda: 0

## 2022-04-25 DIAGNOSIS — F17210 Nicotine dependence, cigarettes, uncomplicated: Secondary | ICD-10-CM | POA: Diagnosis present

## 2022-04-25 DIAGNOSIS — E7800 Pure hypercholesterolemia, unspecified: Secondary | ICD-10-CM | POA: Diagnosis present

## 2022-04-25 DIAGNOSIS — I959 Hypotension, unspecified: Secondary | ICD-10-CM | POA: Diagnosis not present

## 2022-04-25 DIAGNOSIS — W19XXXA Unspecified fall, initial encounter: Secondary | ICD-10-CM | POA: Diagnosis present

## 2022-04-25 DIAGNOSIS — Z86010 Personal history of colonic polyps: Secondary | ICD-10-CM | POA: Diagnosis not present

## 2022-04-25 DIAGNOSIS — E1151 Type 2 diabetes mellitus with diabetic peripheral angiopathy without gangrene: Secondary | ICD-10-CM | POA: Diagnosis present

## 2022-04-25 DIAGNOSIS — Z85828 Personal history of other malignant neoplasm of skin: Secondary | ICD-10-CM | POA: Diagnosis not present

## 2022-04-25 DIAGNOSIS — S2249XA Multiple fractures of ribs, unspecified side, initial encounter for closed fracture: Secondary | ICD-10-CM | POA: Diagnosis not present

## 2022-04-25 DIAGNOSIS — Z8249 Family history of ischemic heart disease and other diseases of the circulatory system: Secondary | ICD-10-CM | POA: Diagnosis not present

## 2022-04-25 DIAGNOSIS — Z955 Presence of coronary angioplasty implant and graft: Secondary | ICD-10-CM | POA: Diagnosis not present

## 2022-04-25 DIAGNOSIS — S2243XA Multiple fractures of ribs, bilateral, initial encounter for closed fracture: Secondary | ICD-10-CM | POA: Diagnosis present

## 2022-04-25 DIAGNOSIS — Z89421 Acquired absence of other right toe(s): Secondary | ICD-10-CM | POA: Diagnosis not present

## 2022-04-25 DIAGNOSIS — E039 Hypothyroidism, unspecified: Secondary | ICD-10-CM | POA: Diagnosis present

## 2022-04-25 DIAGNOSIS — I252 Old myocardial infarction: Secondary | ICD-10-CM | POA: Diagnosis not present

## 2022-04-25 DIAGNOSIS — K922 Gastrointestinal hemorrhage, unspecified: Secondary | ICD-10-CM | POA: Diagnosis present

## 2022-04-25 DIAGNOSIS — I25118 Atherosclerotic heart disease of native coronary artery with other forms of angina pectoris: Secondary | ICD-10-CM | POA: Diagnosis not present

## 2022-04-25 DIAGNOSIS — F32A Depression, unspecified: Secondary | ICD-10-CM | POA: Diagnosis present

## 2022-04-25 DIAGNOSIS — Z794 Long term (current) use of insulin: Secondary | ICD-10-CM | POA: Diagnosis not present

## 2022-04-25 DIAGNOSIS — K264 Chronic or unspecified duodenal ulcer with hemorrhage: Secondary | ICD-10-CM | POA: Diagnosis present

## 2022-04-25 DIAGNOSIS — I452 Bifascicular block: Secondary | ICD-10-CM | POA: Diagnosis present

## 2022-04-25 DIAGNOSIS — N179 Acute kidney failure, unspecified: Secondary | ICD-10-CM | POA: Diagnosis present

## 2022-04-25 DIAGNOSIS — K2971 Gastritis, unspecified, with bleeding: Secondary | ICD-10-CM | POA: Diagnosis present

## 2022-04-25 DIAGNOSIS — I2510 Atherosclerotic heart disease of native coronary artery without angina pectoris: Secondary | ICD-10-CM | POA: Diagnosis present

## 2022-04-25 DIAGNOSIS — D62 Acute posthemorrhagic anemia: Secondary | ICD-10-CM | POA: Diagnosis present

## 2022-04-25 DIAGNOSIS — Z7984 Long term (current) use of oral hypoglycemic drugs: Secondary | ICD-10-CM | POA: Diagnosis not present

## 2022-04-25 DIAGNOSIS — I10 Essential (primary) hypertension: Secondary | ICD-10-CM | POA: Diagnosis present

## 2022-04-25 DIAGNOSIS — J449 Chronic obstructive pulmonary disease, unspecified: Secondary | ICD-10-CM | POA: Diagnosis present

## 2022-04-25 DIAGNOSIS — K921 Melena: Secondary | ICD-10-CM | POA: Diagnosis present

## 2022-04-25 DIAGNOSIS — Z79899 Other long term (current) drug therapy: Secondary | ICD-10-CM | POA: Diagnosis not present

## 2022-04-25 LAB — BASIC METABOLIC PANEL
Anion gap: 10 (ref 5–15)
BUN: 16 mg/dL (ref 8–23)
CO2: 20 mmol/L — ABNORMAL LOW (ref 22–32)
Calcium: 8.6 mg/dL — ABNORMAL LOW (ref 8.9–10.3)
Chloride: 104 mmol/L (ref 98–111)
Creatinine, Ser: 1.4 mg/dL — ABNORMAL HIGH (ref 0.61–1.24)
GFR, Estimated: 54 mL/min — ABNORMAL LOW (ref >60)
Glucose, Bld: 115 mg/dL — ABNORMAL HIGH (ref 70–99)
Potassium: 4.1 mmol/L (ref 3.5–5.1)
Sodium: 134 mmol/L — ABNORMAL LOW (ref 135–145)

## 2022-04-25 LAB — CBC
HCT: 26.1 % — ABNORMAL LOW (ref 39.0–52.0)
Hemoglobin: 8.3 g/dL — ABNORMAL LOW (ref 13.0–17.0)
MCH: 32 pg (ref 26.0–34.0)
MCHC: 31.8 g/dL (ref 30.0–36.0)
MCV: 100.8 fL — ABNORMAL HIGH (ref 80.0–100.0)
Platelets: 172 10*3/uL (ref 150–400)
RBC: 2.59 MIL/uL — ABNORMAL LOW (ref 4.22–5.81)
RDW: 16.6 % — ABNORMAL HIGH (ref 11.5–15.5)
WBC: 10.3 10*3/uL (ref 4.0–10.5)
nRBC: 0 % (ref 0.0–0.2)

## 2022-04-25 LAB — TYPE AND SCREEN
ABO/RH(D): O POS
Antibody Screen: NEGATIVE
Unit division: 0

## 2022-04-25 LAB — SURGICAL PATHOLOGY

## 2022-04-25 LAB — BPAM RBC
Blood Product Expiration Date: 202311252359
ISSUE DATE / TIME: 202311020530
Unit Type and Rh: 5100

## 2022-04-25 LAB — GLUCOSE, CAPILLARY
Glucose-Capillary: 134 mg/dL — ABNORMAL HIGH (ref 70–99)
Glucose-Capillary: 221 mg/dL — ABNORMAL HIGH (ref 70–99)
Glucose-Capillary: 146 mg/dL — ABNORMAL HIGH (ref 70–99)
Glucose-Capillary: 148 mg/dL — ABNORMAL HIGH (ref 70–99)

## 2022-04-25 NOTE — Progress Notes (Signed)
     Progress Note    ASSESSMENT AND PLAN:   UGI bleed d/t DU. Resolved. Hb stable Plan: -Advance diet. -Protonix 40 BID x 12 weeks, then QD -Avoid nonsteroidals. -Resume Plavix in 48 hours -Will sign off for now. -Details please see EGD note. -FU GI as outpt for rpt EGD in 12 weeks.     SUBJECTIVE   Feels much better. No further GI bleeding. Tolerating p.o. without any problems.    OBJECTIVE:     Vital signs in last 24 hours: Temp:  [97.5 F (36.4 C)-98 F (36.7 C)] 97.5 F (36.4 C) (11/03 0753) Pulse Rate:  [68-81] 80 (11/03 0753) Resp:  [16-28] 16 (11/03 0753) BP: (101-138)/(56-81) 124/63 (11/03 0753) SpO2:  [93 %-100 %] 100 % (11/03 0753) Last BM Date : 04/24/22 General:   Alert, well-developed male in NAD EENT:  Normal hearing, non icteric sclera, conjunctive pink.  Heart:  Regular rate and rhythm; no murmur.  No lower extremity edema   Pulm: Normal respiratory effort, lungs CTA bilaterally without wheezes or crackles. Abdomen:  Soft, nondistended, nontender.  Normal bowel sounds,.       Neurologic:  Alert and  oriented x4;  grossly normal neurologically. Psych:  Pleasant, cooperative.  Normal mood and affect.   Intake/Output from previous day: 11/02 0701 - 11/03 0700 In: 840 [P.O.:120; I.V.:250; Blood:470] Out: 400 [Urine:400] Intake/Output this shift: No intake/output data recorded.  Lab Results: Recent Labs    04/23/22 0404 04/24/22 0313 04/24/22 1106 04/25/22 0220  WBC 9.6 9.9  --  10.3  HGB 7.6* 6.8* 8.8* 8.3*  HCT 22.6* 20.9* 26.4* 26.1*  PLT 204 186  --  172   BMET Recent Labs    04/23/22 0404 04/24/22 0313 04/25/22 0220  NA 134* 136 134*  K 4.2 4.0 4.1  CL 106 106 104  CO2 20* 22 20*  GLUCOSE 124* 108* 115*  BUN '21 20 16  '$ CREATININE 1.37* 1.34* 1.40*  CALCIUM 8.5* 8.5* 8.6*   LFT Recent Labs    04/22/22 1224  PROT 5.5*  ALBUMIN 3.0*  AST 14*  ALT 13  ALKPHOS 70  BILITOT 0.6   PT/INR No results for input(s):  "LABPROT", "INR" in the last 72 hours. Hepatitis Panel No results for input(s): "HEPBSAG", "HCVAB", "HEPAIGM", "HEPBIGM" in the last 72 hours.  No results found.   Principal Problem:   Hematochezia Active Problems:   DM (diabetes mellitus) ([place])   HTN (hypertension)   Hyperlipidemia   CAD, CABG Feb 2011, cath x 4 since-medical Rx   Hypothyroidism   ABLA (acute blood loss anemia)   Rib fractures   GIB (gastrointestinal bleeding)     LOS: 0 days     Carmell Austria, MD 04/25/2022, 12:12 PM [facility] [phone]

## 2022-04-25 NOTE — Progress Notes (Signed)
PROGRESS NOTE    Jason [name]  JKK:[id number] DOB: 1951/03/29 DOA: 04/22/2022 PCP: Marrian Salvage, FNP    Brief Narrative:  Jason [name] is a 71 y.o. male with past medical history significant of CAD s/p CABG 2011, subsequent stents, stroke following ICA stent on chronic DAPT, diabetes mellitus type 2 presented to the hospital  after being lightheaded while standing and his knees gave up but there was no loss of consciousness.  He also reported 4 large bloody bowel movements which were new for him.   In the ED, patient was noted to have hematochezia.  He also complained of lower rib pain from recent fall.  Initial blood pressure was stable.  Initial Hemoglobin was 9.8.  Sodium level was 131.  Occult blood was positive.  UA showed some leukocytes with white cells but nitrites were negative.  Patient was then considered for admission to hospital for further evaluation and treatment.  During hospitalization, patient has been seen by GI.  DAPT on hold.  Patient underwent EGD/colonoscopy evaluation 11--02-23 with findings of large duodenal ulcer-likely culprit for GI bleed.  Patient has been started on IV PPI twice daily.  Assessment and Plan:  Principal Problem:   Hematochezia Active Problems:   ABLA (acute blood loss anemia)   Rib fractures   DM (diabetes mellitus) ([facility])   HTN (hypertension)   Hyperlipidemia   CAD, CABG Feb 2011, cath x 4 since-medical Rx   Hypothyroidism   GIB (gastrointestinal bleeding)   *Painless hematochezia secondary to large duodenal ulcer   Patient was on DAPT as outpatient and briefly had NSAIDs.  EGD done on 04/24/2022 showed large duodenal ulcer which was deep.  Colonoscopy was unremarkable.  Has been transitioned to IV PPI at this time.  Has high risk of bleeding.  GI recommends Plavix in 3 days and if possible to hold aspirin for the next 8 weeks.  Will need to continue PPI twice daily on discharge for 12 weeks followed by indefinite PPI daily.   GI has plan to follow-up in 6 to 8 weeks with possible repeat endoscopy in 12 weeks.  acute blood loss anemia secondary to GI bleed. Symptoms PRBC transfusion.  Hemoglobin today at 8.3 from 8.8.    Hemoglobin 1 year back was 12.6.   Rib fractures Continue supportive care, Percocet, incentive spirometry.  Continue pain control.  Was advised against NSAIDs.  History of CAD, CABG Feb 2011, cath x 4, peripheral vascular disease status post great toe amputation. Carotid stenosis with stent.. History of intervention to the lower extremities and right great toe amputation.  History of CVA with ICA stent.  Continue to hold dual antiplatelets in the setting of GI bleed with acute blood loss anemia.  GI has plans for resuming Plavix in 3 days.  Discussed with the patient about extensive vascular history.  Really high risk of vascular complications if not on antiplatelets.  Continue Ranexa, Imdur, statins.   Hyperlipidemia Continue Statin   HTN (hypertension) Continue Imdur   DM (diabetes mellitus)  Continue moderate risk of sliding scale insulin, Accu-Cheks, Jardiance.  Latest hemoglobin A1c of 7.0.  Hypothyroidism.  Continue Synthroid.  Recent weakness and fall.  Obtain PT.     DVT prophylaxis: SCDs Start: 04/22/22 2140   Code Status:     Code Status: Full Code  Disposition: Home  Status is: Inpatient  The patient is inpatient because: Status post EGD, significant anemia requiring PRBC transfusion, large duodenal ulcer with risk of bleeding, IV PPI.  Closer monitoring.   Family Communication: None at present  Consultants:  GI  Procedures:  PRBC transfusion EGD on 04/24/22 Colonoscopy on 04/24/2022  Antimicrobials:  None  Anti-infectives (From admission, onward)    None      Subjective: Today, patient was seen and examined at bedside.  Patient denies any nausea vomiting fever or chills.  Has not had further bowel movements.  Was able to eat some.  Objective: Vitals:    04/24/22 1657 04/24/22 2254 04/25/22 0514 04/25/22 0753  BP: 133/67 (!) 125/56 (!) 128/59 124/63  Pulse: 79 79 81 80  Resp: '18 20 18 16  '$ Temp: 98 F (36.7 C) 97.6 F (36.4 C) (!) 97.5 F (36.4 C) (!) 97.5 F (36.4 C)  TempSrc: Oral Oral Oral Oral  SpO2: 93% 100% 100% 100%  Weight:      Height:        Intake/Output Summary (Last 24 hours) at 04/25/2022 0808 Last data filed at 04/25/2022 0515 Gross per 24 hour  Intake 840 ml  Output 400 ml  Net 440 ml    Filed Weights   04/22/22 1201 04/24/22 1158  Weight: 70.3 kg 68 kg    Physical Examination: Body mass index is 22.48 kg/m.   General:  Average built, not in obvious distress HENT: Mild pallor noted. Oral mucosa is moist.  Chest:  Clear breath sounds.  Diminished breath sounds bilaterally. No crackles or wheezes.  CVS: S1 &S2 heard. No murmur.  Regular rate and rhythm.  CABG scar noted. Abdomen: Soft, nontender, nondistended.  Bowel sounds are heard.   Extremities: No cyanosis, clubbing or edema.   Left leg scar noted great toe amputation. Psych: Alert, awake and oriented, normal mood CNS:  No cranial nerve deficits.  Power equal in all extremities.   Skin: Warm and dry.  No rashes noted.  Data Reviewed:   CBC: Recent Labs  Lab 04/22/22 1224 04/22/22 1245 04/23/22 0404 04/24/22 0313 04/24/22 1106 04/25/22 0220  WBC 10.2  --  9.6 9.9  --  10.3  NEUTROABS 7.7  --   --   --   --   --   HGB 9.8* 9.5* 7.6* 6.8* 8.8* 8.3*  HCT 29.8* 28.0* 22.6* 20.9* 26.4* 26.1*  MCV 102.4*  --  99.6 101.5*  --  100.8*  PLT 231  --  204 186  --  172     Basic Metabolic Panel: Recent Labs  Lab 04/22/22 1224 04/22/22 1245 04/23/22 0404 04/24/22 0313 04/25/22 0220  NA 133* 131* 134* 136 134*  K 4.4 4.3 4.2 4.0 4.1  CL 105 107 106 106 104  CO2 18*  --  20* 22 20*  GLUCOSE 191* 195* 124* 108* 115*  BUN '17 17 21 20 16  '$ CREATININE 1.42* 1.20 1.37* 1.34* 1.40*  CALCIUM 8.9  --  8.5* 8.5* 8.6*  MG  --   --   --  1.9  --       Liver Function Tests: Recent Labs  Lab 04/22/22 1224  AST 14*  ALT 13  ALKPHOS 70  BILITOT 0.6  PROT 5.5*  ALBUMIN 3.0*      Radiology Studies: No results found.    LOS: 0 days    Flora Lipps, MD [facility] Available via Epic secure chat 7am-7pm After these hours, please refer to coverage provider listed on [email or web address] 04/25/2022, 8:08 AM

## 2022-04-25 NOTE — TOC CM/SW Note (Signed)
  Transition of Care [facility]) Screening Note   Patient Details  Name: Jason [name] Date of Birth: 1951/04/12   Transition of Care [facility]) CM/SW Contact:    Marilu Favre, RN Phone Number: 04/25/2022, 3:35 PM    Transition of Care Department [facility]) has reviewed patient and no TOC needs have been identified at this time. We will continue to monitor patient advancement through interdisciplinary progression rounds. If new patient transition needs arise, please place a TOC consult.

## 2022-04-26 LAB — BASIC METABOLIC PANEL
Anion gap: 8 (ref 5–15)
BUN: 15 mg/dL (ref 8–23)
CO2: 22 mmol/L (ref 22–32)
Calcium: 8.9 mg/dL (ref 8.9–10.3)
Chloride: 105 mmol/L (ref 98–111)
Creatinine, Ser: 1.71 mg/dL — ABNORMAL HIGH (ref 0.61–1.24)
GFR, Estimated: 43 mL/min — ABNORMAL LOW (ref >60)
Glucose, Bld: 137 mg/dL — ABNORMAL HIGH (ref 70–99)
Potassium: 4.7 mmol/L (ref 3.5–5.1)
Sodium: 135 mmol/L (ref 135–145)

## 2022-04-26 LAB — CBC
HCT: 24 % — ABNORMAL LOW (ref 39.0–52.0)
Hemoglobin: 8.2 g/dL — ABNORMAL LOW (ref 13.0–17.0)
MCH: 32.7 pg (ref 26.0–34.0)
MCHC: 34.2 g/dL (ref 30.0–36.0)
MCV: 95.6 fL (ref 80.0–100.0)
Platelets: 184 10*3/uL (ref 150–400)
RBC: 2.51 MIL/uL — ABNORMAL LOW (ref 4.22–5.81)
RDW: 15.6 % — ABNORMAL HIGH (ref 11.5–15.5)
WBC: 9.5 10*3/uL (ref 4.0–10.5)
nRBC: 0 % (ref 0.0–0.2)

## 2022-04-26 LAB — GLUCOSE, CAPILLARY: Glucose-Capillary: 130 mg/dL — ABNORMAL HIGH (ref 70–99)

## 2022-04-26 MED ORDER — PANTOPRAZOLE SODIUM 40 MG PO TBEC: DELAYED_RELEASE_TABLET | ORAL | 0 refills | Status: AC

## 2022-04-26 MED ORDER — LIDOCAINE 5 % EX PTCH: 1.0000 | MEDICATED_PATCH | CUTANEOUS | 0 refills | Status: AC

## 2022-04-26 MED ORDER — ACETAMINOPHEN ER 650 MG PO TBCR: 650.0000 mg | EXTENDED_RELEASE_TABLET | Freq: Two times a day (BID) | ORAL | Status: AC | PRN

## 2022-04-26 MED ORDER — METFORMIN HCL 500 MG PO TABS: 500.0000 mg | ORAL_TABLET | Freq: Two times a day (BID) | ORAL | 2 refills | Status: AC

## 2022-04-26 MED ORDER — NICOTINE 21 MG/24HR TD PT24: 21.0000 mg | MEDICATED_PATCH | Freq: Every day | TRANSDERMAL | 0 refills | Status: DC

## 2022-04-26 MED ORDER — ASPIRIN 81 MG PO TBEC: 81.0000 mg | DELAYED_RELEASE_TABLET | Freq: Every day | ORAL | Status: AC

## 2022-04-26 MED ORDER — OXYCODONE-ACETAMINOPHEN 5-325 MG PO TABS: 1.0000 | ORAL_TABLET | Freq: Three times a day (TID) | ORAL | 0 refills | Status: AC | PRN

## 2022-04-26 NOTE — Discharge Summary (Signed)
Physician Discharge Summary  Jason [name] ZLD:[id number] DOB: 1951-06-13 DOA: 04/22/2022  PCP: Marrian Salvage, FNP  Admit date: 04/22/2022 Discharge date: 04/26/2022  Admitted From: Home  Discharge disposition: Home   Recommendations for Outpatient Follow-Up:   Follow up with your primary care provider in one week.  Check CBC, BMP, magnesium in the next visit Follow-up with Dr. Lyndel Safe GI in 8 weeks.  Plan for upper GI endoscopy in 12 weeks. Patient was  encouraged quitting smoking.  Has been prescribed nicotine patch.   Discharge Diagnosis:   Principal Problem:   Hematochezia Active Problems:   ABLA (acute blood loss anemia)   Rib fractures   DM (diabetes mellitus) (HCC)   HTN (hypertension)   Hyperlipidemia   CAD, CABG Feb 2011, cath x 4 since-medical Rx   Hypothyroidism   GIB (gastrointestinal bleeding)   Discharge Condition: Improved.  Diet recommendation: Low sodium, heart healthy.  Carbohydrate-modified.    Wound care: None.  Code status: Full.   History of Present Illness:   Jason [name] is a 71 y.o. male with past medical history significant of CAD s/p CABG 2011, subsequent stents, stroke following ICA stent on chronic DAPT, diabetes mellitus type 2 presented to the hospital  after being lightheaded while standing and his knees gave up but there was no loss of consciousness.  He also reported 4 large bloody bowel movements which were new for him.   In the ED, patient was noted to have hematochezia.  He also complained of lower rib pain from recent fall.  Initial blood pressure was stable.  Initial Hemoglobin was 9.8.  Sodium level was 131.  Occult blood was positive.  UA showed some leukocytes with white cells but nitrites were negative.  Patient was then considered for admission to hospital for further evaluation and treatment.   Hospital Course:   Following conditions were addressed during hospitalization as listed below,  Painless  hematochezia secondary to large duodenal ulcer Patient was on DAPT as outpatient and was taking NSAIDs for back pain.Marland Kitchen  GI was consulted for GI bleed and patient underwent EGD done on 04/24/2022 showed large duodenal ulcer which was deep.  Colonoscopy was unremarkable.    GI recommended resumption of Plavix but holding aspirin for some time.  Patient has multiple vascular risk factors including carotid stent with stenosis, cardiac stent, peripheral vascular disease with amputation so this was discussed with the patient in detail.  At this time, we will start aspirin in 4 weeks which will give some time for the ulcer to heal..  Patient will continue PPI twice daily on discharge for 12 weeks followed by indefinite PPI daily.  He was strongly advised to avoid ibuprofen which he was taking for his back pain.  Will be prescribed Percocet and Lidoderm patches to help him go through the pain.  GI has plan to follow-up in 6 to 8 weeks with possible repeat endoscopy in 12 weeks.   acute blood loss anemia secondary to GI bleed. Status post PRBC transfusion.  Hemoglobin today at 8.2 from 8.3<8.8. Hemoglobin 1 year back was 12.6.  No further bleeding.  Advised outpatient follow-up with PCP.   Rib fractures Continue supportive care, Percocet, advised against ibuprofen.   History of CAD, CABG Feb 2011, cath x 4, peripheral vascular disease status post great toe amputation. Carotid stenosis with stent.. History of intervention to the lower extremities and right great toe amputation.  History of CVA with ICA stent.    Discussed with  the patient about extensive vascular history.  Will resume Plavix from tomorrow.  Hold aspirin for next 4 weeks due to high risk gastric ulcer.  Continue Ranexa, Imdur, statins.   Hyperlipidemia Continue Statin   HTN (hypertension) Continue Imdur   DM (diabetes mellitus)  Plan to resume Jardiance and metformin from home.  Latest hemoglobin A1c of 7.0.   Hypothyroidism.  Continue  Synthroid.   Disposition.  At this time, patient is stable for disposition home with outpatient PCP and GI follow-up.  Medical Consultants:   GI  Procedures:    PRBC transfusion EGD on 04/24/22 Colonoscopy on 04/24/2022 Subjective:   Today, patient was seen and examined at bedside.  Denies any nausea vomiting fever but has some  back pain.  No mention of rectal bleed  Discharge Exam:   Vitals:   04/26/22 0453 04/26/22 0726  BP: 115/79 124/71  Pulse: (!) 53 81  Resp: 15 16  Temp: 98.2 F (36.8 C) 97.7 F (36.5 C)  SpO2: (!) 85% 100%   Vitals:   04/25/22 0753 04/25/22 2053 04/26/22 0453 04/26/22 0726  BP: 124/63 138/64 115/79 124/71  Pulse: 80 62 (!) 53 81  Resp: '16  15 16  '$ Temp: (!) 97.5 F (36.4 C) (!) 97.4 F (36.3 C) 98.2 F (36.8 C) 97.7 F (36.5 C)  TempSrc: Oral Oral Oral Oral  SpO2: 100% (!) 87% (!) 85% 100%  Weight:      Height:        General: Alert awake, not in obvious distress HENT: pupils equally reacting to light, mild pallor noted.  Oral mucosa is moist.  Chest:  Clear breath sounds.  Diminished breath sounds bilaterally. No crackles or wheezes.  CABG scar noted CVS: S1 &S2 heard. No murmur.  Regular rate and rhythm. Abdomen: Soft, nontender, nondistended.  Bowel sounds are heard.   Extremities: No cyanosis, clubbing or edema.  Peripheral pulses are palpable.  Left leg scar with grade 2 amputation Psych: Alert, awake and oriented, normal mood CNS:  No cranial nerve deficits.  Power equal in all extremities.   Skin: Warm and dry.  No rashes noted.  The results of significant diagnostics from this hospitalization (including imaging, microbiology, ancillary and laboratory) are listed below for reference.     Diagnostic Studies:   MR BRAIN WO CONTRAST  Result Date: 04/22/2022 CLINICAL DATA:  Fall last week, lightheadedness. EXAM: MRI HEAD WITHOUT CONTRAST TECHNIQUE: Multiplanar, multiecho pulse sequences of the brain and surrounding structures  were obtained without intravenous contrast. COMPARISON:  Same-day noncontrast CT head FINDINGS: Brain: There is no acute intracranial hemorrhage, extra-axial fluid collection, or acute infarct There is moderate background parenchymal volume loss with prominence of the ventricular system and extra-axial CSF spaces. There is a small remote infarct in the right parietal white matter extending to the cortex which is evolved since 2016. A small remote lacunar infarct in the left cerebellar hemisphere is new since 2016. A small area of encephalomalacia in the left insular region was present in 2016. There is a tiny cortical infarct in the right precentral gyrus, unchanged since 2016. There is a remote cortical infarct in the left occipital lobe, accounting for the finding on the CT head from 1 day priorand unchanged since the MRI from 2016. There is patchy FLAIR signal abnormality in the supratentorial white matter likely reflecting moderate chronic small vessel ischemic change, overall progressed since 2016. No chronic blood products are seen. There is no mass lesion.  There is no mass effect  or midline shift. Vascular: The left high cervical and intracranial ICA flow void is absent, unchanged since 2016 and likely occluded. The MCA and ACA flow voids are normal. The other major flow voids are normal. Skull and upper cervical spine: Normal marrow signal. Sinuses/Orbits: There is complete opacification of the sphenoid sinuses with findings consistent with chronic sinusitis on the prior CT. Bilateral lens implants are in place. The globes and orbits are otherwise unremarkable. Other: There are trace mastoid effusions. IMPRESSION: 1. No acute intracranial pathology. 2. Remote infarct in the left occipital lobe accounting for the finding on the head CT from 1 day prior. 3. Additional small remote infarcts and background chronic small-vessel ischemic changes are overall progressed since 2016 4. Absent left ICA flow void,  likely occluded and unchanged since 2016 Electronically Signed   By: Valetta Mole M.D.   On: 04/22/2022 18:33   CT CHEST ABDOMEN PELVIS W CONTRAST  Result Date: 04/22/2022 CLINICAL DATA:  Chest wall pain. Polytrauma, blunt [id number] Trauma [phone] EXAM: CT CHEST, ABDOMEN, AND PELVIS WITH CONTRAST TECHNIQUE: Multidetector CT imaging of the chest, abdomen and pelvis was performed following the standard protocol during bolus administration of intravenous contrast. RADIATION DOSE REDUCTION: This exam was performed according to the departmental dose-optimization program which includes automated exposure control, adjustment of the mA and/or kV according to patient size and/or use of iterative reconstruction technique. CONTRAST:  48m OMNIPAQUE IOHEXOL 350 MG/ML SOLN COMPARISON:  CT abdomen pelvis 06/08/2020 FINDINGS: CT CHEST FINDINGS Cardiovascular: Heart size within normal limits. No pericardial effusion. Prior sternotomy and CABG. Thoracic aorta is nonaneurysmal. Atherosclerotic calcification of the aorta and coronary arteries. Central pulmonary vasculature is within normal limits. Mediastinum/Nodes: No mediastinal hematoma. No enlarged mediastinal, hilar, or axillary lymph nodes. Thyroid gland, trachea, and esophagus demonstrate no significant findings. Lungs/Pleura: Small bilateral pleural effusions. No airspace consolidation. No pneumothorax. Musculoskeletal: Acute minimally displaced fracture of the anterolateral right seventh rib. Acute minimally displaced fracture of the lateral left eighth rib. Thoracic vertebral body heights are maintained without evidence of fracture. Chronic fractures of the right eighth, tenth, and eleventh ribs. CT ABDOMEN PELVIS FINDINGS Hepatobiliary: No hepatic injury or perihepatic hematoma. Gallbladder is unremarkable. Pancreas: Unremarkable. No pancreatic ductal dilatation or surrounding inflammatory changes. Spleen: Normal in size without focal abnormality. Adrenals/Urinary Tract:  No adrenal hemorrhage or renal injury identified. Urinary bladder is moderately distended with mild diffuse wall thickening. Stomach/Bowel: Stomach is within normal limits. Appendix appears normal. No evidence of bowel wall thickening, distention, or inflammatory changes. Vascular/Lymphatic: Aortic atherosclerosis. No enlarged abdominal or pelvic lymph nodes. Reproductive: Prostate is unremarkable. Other: No free fluid. No abdominopelvic fluid collection. No pneumoperitoneum. No abdominal wall hernia. Musculoskeletal: No acute or significant osseous findings. Prior L4-S1 fusion. Advanced multilevel degenerative disc disease of the lumbar spine. IMPRESSION: 1. Acute minimally displaced fractures of the right seventh and left eighth ribs. No pneumothorax. 2. Small bilateral pleural effusions. 3. No acute traumatic injury within the abdomen or pelvis. 4. Mild diffuse wall thickening of the urinary bladder. Correlate with urinalysis to exclude cystitis. 5. Aortic and coronary artery atherosclerosis (ICD10-I70.0). Electronically Signed   By: NDavina PokeD.O.   On: 04/22/2022 14:01   CT HEAD WO CONTRAST  Result Date: 04/22/2022 CLINICAL DATA:  Trauma EXAM: CT HEAD WITHOUT CONTRAST CT CERVICAL SPINE WITHOUT CONTRAST TECHNIQUE: Multidetector CT imaging of the head and cervical spine was performed following the standard protocol without intravenous contrast. Multiplanar CT image reconstructions of the cervical spine were also generated. RADIATION DOSE  REDUCTION: This exam was performed according to the departmental dose-optimization program which includes automated exposure control, adjustment of the mA and/or kV according to patient size and/or use of iterative reconstruction technique. COMPARISON:  MRI Brain 04/04/2015 FINDINGS: CT HEAD FINDINGS Brain: No hemorrhage. No extra-axial fluid collection. There is sequela of severe chronic microvascular ischemic change with likely subacute, but technically age  indeterminate infarcts in the left occipital lobe (series 3, image 15). There is ventriculomegaly, which is likely proportional to the degree of volume loss, which is advanced. Vascular: No hyperdense vessel or unexpected calcification. Skull: Normal. Negative for fracture or focal lesion. Sinuses/Orbits: No acute finding. Other: None. CT CERVICAL SPINE FINDINGS Alignment: There is straightening of the normal cervical lordosis. Skull base and vertebrae: No acute fracture. No primary bone lesion or focal pathologic process. Soft tissues and spinal canal: No prevertebral fluid or swelling. No visible canal hematoma. Right common carotid artery vascular stent place. Disc levels: There are multilevel degenerative changes with calcified disc bulges at the C3-C4, C4-C5, and C5-C6 level. There is likely moderate to severe spinal canal stenosis at the C4-C5 and C5-C6 vertebral body levels Upper chest: Negative. Other: None IMPRESSION: 1. No CT evidence of intracranial injury. 2. Possibly subacute, but technically age indeterminate infarct in the left occipital lobe. Recommend MRI for further evaluation. 3. No acute cervical spine fracture. Multilevel degenerative changes with likely moderate to severe spinal canal stenosis at the C4-C5 and C5-C6 vertebral body levels. Electronically Signed   By: Marin Roberts M.D.   On: 04/22/2022 13:49   CT CERVICAL SPINE WO CONTRAST  Result Date: 04/22/2022 CLINICAL DATA:  Trauma EXAM: CT HEAD WITHOUT CONTRAST CT CERVICAL SPINE WITHOUT CONTRAST TECHNIQUE: Multidetector CT imaging of the head and cervical spine was performed following the standard protocol without intravenous contrast. Multiplanar CT image reconstructions of the cervical spine were also generated. RADIATION DOSE REDUCTION: This exam was performed according to the departmental dose-optimization program which includes automated exposure control, adjustment of the mA and/or kV according to patient size and/or use of  iterative reconstruction technique. COMPARISON:  MRI Brain 04/04/2015 FINDINGS: CT HEAD FINDINGS Brain: No hemorrhage. No extra-axial fluid collection. There is sequela of severe chronic microvascular ischemic change with likely subacute, but technically age indeterminate infarcts in the left occipital lobe (series 3, image 15). There is ventriculomegaly, which is likely proportional to the degree of volume loss, which is advanced. Vascular: No hyperdense vessel or unexpected calcification. Skull: Normal. Negative for fracture or focal lesion. Sinuses/Orbits: No acute finding. Other: None. CT CERVICAL SPINE FINDINGS Alignment: There is straightening of the normal cervical lordosis. Skull base and vertebrae: No acute fracture. No primary bone lesion or focal pathologic process. Soft tissues and spinal canal: No prevertebral fluid or swelling. No visible canal hematoma. Right common carotid artery vascular stent place. Disc levels: There are multilevel degenerative changes with calcified disc bulges at the C3-C4, C4-C5, and C5-C6 level. There is likely moderate to severe spinal canal stenosis at the C4-C5 and C5-C6 vertebral body levels Upper chest: Negative. Other: None IMPRESSION: 1. No CT evidence of intracranial injury. 2. Possibly subacute, but technically age indeterminate infarct in the left occipital lobe. Recommend MRI for further evaluation. 3. No acute cervical spine fracture. Multilevel degenerative changes with likely moderate to severe spinal canal stenosis at the C4-C5 and C5-C6 vertebral body levels. Electronically Signed   By: Marin Roberts M.D.   On: 04/22/2022 13:49   DG Chest 2 View  Result Date: 04/22/2022 CLINICAL  DATA:  Chest wall pain since fall a few days ago. EXAM: CHEST - 2 VIEW COMPARISON:  Chest x-ray dated September 01, 2020. FINDINGS: The heart size and mediastinal contours are within normal limits. Prior CABG. Both lungs are clear. The visualized skeletal structures are unremarkable.  IMPRESSION: 1. No acute cardiopulmonary disease. Electronically Signed   By: Titus Dubin M.D.   On: 04/22/2022 12:46     Labs:   Basic Metabolic Panel: Recent Labs  Lab 04/22/22 1224 04/22/22 1245 04/23/22 0404 04/24/22 0313 04/25/22 0220 04/26/22 0238  NA 133* 131* 134* 136 134* 135  K 4.4 4.3 4.2 4.0 4.1 4.7  CL 105 107 106 106 104 105  CO2 18*  --  20* 22 20* 22  GLUCOSE 191* 195* 124* 108* 115* 137*  BUN '17 17 21 20 16 15  '$ CREATININE 1.42* 1.20 1.37* 1.34* 1.40* 1.71*  CALCIUM 8.9  --  8.5* 8.5* 8.6* 8.9  MG  --   --   --  1.9  --   --    GFR Estimated Creatinine Clearance: 39.2 mL/min (A) (by C-G formula based on SCr of 1.71 mg/dL (H)). Liver Function Tests: Recent Labs  Lab 04/22/22 1224  AST 14*  ALT 13  ALKPHOS 70  BILITOT 0.6  PROT 5.5*  ALBUMIN 3.0*   No results for input(s): "LIPASE", "AMYLASE" in the last 168 hours. No results for input(s): "AMMONIA" in the last 168 hours. Coagulation profile No results for input(s): "INR", "PROTIME" in the last 168 hours.  CBC: Recent Labs  Lab 04/22/22 1224 04/22/22 1245 04/23/22 0404 04/24/22 0313 04/24/22 1106 04/25/22 0220 04/26/22 0238  WBC 10.2  --  9.6 9.9  --  10.3 9.5  NEUTROABS 7.7  --   --   --   --   --   --   HGB 9.8*   < > 7.6* 6.8* 8.8* 8.3* 8.2*  HCT 29.8*   < > 22.6* 20.9* 26.4* 26.1* 24.0*  MCV 102.4*  --  99.6 101.5*  --  100.8* 95.6  PLT 231  --  204 186  --  172 184   < > = values in this interval not displayed.   Cardiac Enzymes: No results for input(s): "CKTOTAL", "CKMB", "CKMBINDEX", "TROPONINI" in the last 168 hours. BNP: Invalid input(s): "POCBNP" CBG: Recent Labs  Lab 04/25/22 0837 04/25/22 1212 04/25/22 1703 04/25/22 2116 04/26/22 0758  GLUCAP 134* 221* 146* 148* 130*   D-Dimer No results for input(s): "DDIMER" in the last 72 hours. Hgb A1c No results for input(s): "HGBA1C" in the last 72 hours. Lipid Profile No results for input(s): "CHOL", "HDL", "LDLCALC",  "TRIG", "CHOLHDL", "LDLDIRECT" in the last 72 hours. Thyroid function studies No results for input(s): "TSH", "T4TOTAL", "T3FREE", "THYROIDAB" in the last 72 hours.  Invalid input(s): "FREET3" Anemia work up No results for input(s): "VITAMINB12", "FOLATE", "FERRITIN", "TIBC", "IRON", "RETICCTPCT" in the last 72 hours. Microbiology No results found for this or any previous visit (from the past 240 hour(s)).   Discharge Instructions:   Discharge Instructions     Diet Carb Modified   Complete by: As directed    Discharge instructions   Complete by: As directed    Follow-up with your primary care provider in 1 week.  Check blood work at that time.  Do not take Motrin Aleve ibuprofen or over-the-counter pain medication.  Okay to take Tylenol.  No smoking or alcohol ingestion.  Please do not take aspirin for the next 4 weeks.  Follow-up with GI Dr. Lyndel Safe in 6 to 8 weeks.  You will need repeat endoscopy in 12 weeks.  Continue to take Protonix as prescribed without interruption  Seek medical attention for worsening symptoms including ongoing bleeding.   Increase activity slowly   Complete by: As directed       Allergies as of 04/26/2022       Reactions   Coreg [carvedilol] Nausea And Vomiting, Other (See Comments)   Per patient made him dizzy and light sensitive   Sulfa Antibiotics Nausea And Vomiting, Other (See Comments)   Also headaches        Medication List     STOP taking these medications    ibuprofen 200 MG tablet Commonly known as: ADVIL       TAKE these medications    acetaminophen 650 MG CR tablet Commonly known as: TYLENOL Take 1 tablet (650 mg total) by mouth 2 (two) times daily as needed for pain. What changed: how much to take   albuterol (2.5 MG/3ML) 0.083% nebulizer solution Commonly known as: PROVENTIL Take 3 mLs (2.5 mg total) by nebulization every 6 (six) hours as needed for wheezing or shortness of breath.   aspirin EC 81 MG tablet Take 1 tablet  (81 mg total) by mouth daily. Swallow whole. Start taking on: May 24, 2022 What changed: These instructions start on May 24, 2022. If you are unsure what to do until then, ask your doctor or other care provider.   clopidogrel 75 MG tablet Commonly known as: PLAVIX Take 1 tablet (75 mg total) by mouth daily. Please schedule appointment for additional refills. What changed: additional instructions   diphenhydrAMINE 25 MG tablet Commonly known as: BENADRYL Take 75 mg by mouth at bedtime as needed for sleep.   docusate sodium 100 MG capsule Commonly known as: COLACE Take 100 mg by mouth 2 (two) times daily as needed for mild constipation.   escitalopram 20 MG tablet Commonly known as: LEXAPRO TAKE 1 TABLET BY MOUTH EVERY DAY   gabapentin 300 MG capsule Commonly known as: NEURONTIN Take 2 capsules (600 mg total) by mouth 2 (two) times daily.   isosorbide mononitrate 120 MG 24 hr tablet Commonly known as: IMDUR Take 1 tablet (120 mg total) by mouth daily. Schedule an appointment for further refills, final attempt What changed: additional instructions   Jardiance 10 MG Tabs tablet Generic drug: empagliflozin TAKE 1 TABLET EVERY DAY What changed: how much to take   levothyroxine 50 MCG tablet Commonly known as: SYNTHROID Take 1 tablet (50 mcg total) by mouth daily.   lidocaine 5 % Commonly known as: Lidoderm Place 1 patch onto the skin daily. Remove & Discard patch within 12 hours.  Apply at the back pain area.   metFORMIN 500 MG tablet Commonly known as: Glucophage Take 1 tablet (500 mg total) by mouth 2 (two) times daily with a meal.   nicotine 21 mg/24hr patch Commonly known as: NICODERM CQ - dosed in mg/24 hours Place 1 patch (21 mg total) onto the skin daily. Start taking on: April 27, 2022   nitroGLYCERIN 0.4 MG SL tablet Commonly known as: NITROSTAT Place 1 tablet (0.4 mg total) under the tongue every 5 (five) minutes as needed for chest pain.   OVER  THE COUNTER MEDICATION Apply 1 Application topically daily. CBD '3000mg'$  cream for knees.   oxyCODONE-acetaminophen 5-325 MG tablet Commonly known as: PERCOCET/ROXICET Take 1 tablet by mouth every 8 (eight) hours as needed for up to 5 days  for moderate pain or severe pain.   pantoprazole 40 MG tablet Commonly known as: Protonix Take 1 tablet (40 mg total) by mouth 2 (two) times daily before a meal for 84 days, THEN 1 tablet (40 mg total) daily. Start taking on: April 26, 2022   ranolazine 1000 MG SR tablet Commonly known as: RANEXA TAKE 1 TABLET BY MOUTH TWICE A DAY What changed: when to take this   rosuvastatin 40 MG tablet Commonly known as: CRESTOR Take 1 tablet (40 mg total) by mouth daily. NEED OV.        Follow-up Information     Marrian Salvage, FNP Follow up in 1 week(s).   Specialty: Internal Medicine Why: blood work checkup Contact information: [address] [place] [address] [phone]         [name], MD Follow up in 8 day(s).   Specialties: Gastroenterology, Internal Medicine Why: duodenal ulcer followup, plan for repeat endoscopy in 12 weeks Contact information: [address] [place]  [address] [phone]                  Time coordinating discharge: 39 minutes  Signed:  Aldair Rickel  [facility] 04/26/2022, 9:53 AM

## 2022-04-26 NOTE — Progress Notes (Signed)
Nsg Discharge Note  Admit Date:  04/22/2022 Discharge date: 04/26/2022   ESCHER HARR to be D/C'd Home per MD order.  AVS completed. Patient/caregiver able to verbalize understanding.  Discharge Medication: Allergies as of 04/26/2022       Reactions   Coreg [carvedilol] Nausea And Vomiting, Other (See Comments)   Per patient made him dizzy and light sensitive   Sulfa Antibiotics Nausea And Vomiting, Other (See Comments)   Also headaches        Medication List     STOP taking these medications    ibuprofen 200 MG tablet Commonly known as: ADVIL       TAKE these medications    acetaminophen 650 MG CR tablet Commonly known as: TYLENOL Take 1 tablet (650 mg total) by mouth 2 (two) times daily as needed for pain. What changed: how much to take   albuterol (2.5 MG/3ML) 0.083% nebulizer solution Commonly known as: PROVENTIL Take 3 mLs (2.5 mg total) by nebulization every 6 (six) hours as needed for wheezing or shortness of breath.   aspirin EC 81 MG tablet Take 1 tablet (81 mg total) by mouth daily. Swallow whole. Start taking on: May 24, 2022 What changed: These instructions start on May 24, 2022. If you are unsure what to do until then, ask your doctor or other care provider.   clopidogrel 75 MG tablet Commonly known as: PLAVIX Take 1 tablet (75 mg total) by mouth daily. Please schedule appointment for additional refills. What changed: additional instructions   diphenhydrAMINE 25 MG tablet Commonly known as: BENADRYL Take 75 mg by mouth at bedtime as needed for sleep.   docusate sodium 100 MG capsule Commonly known as: COLACE Take 100 mg by mouth 2 (two) times daily as needed for mild constipation.   escitalopram 20 MG tablet Commonly known as: LEXAPRO TAKE 1 TABLET BY MOUTH EVERY DAY   gabapentin 300 MG capsule Commonly known as: NEURONTIN Take 2 capsules (600 mg total) by mouth 2 (two) times daily.   isosorbide mononitrate 120 MG 24 hr  tablet Commonly known as: IMDUR Take 1 tablet (120 mg total) by mouth daily. Schedule an appointment for further refills, final attempt What changed: additional instructions   Jardiance 10 MG Tabs tablet Generic drug: empagliflozin TAKE 1 TABLET EVERY DAY What changed: how much to take   levothyroxine 50 MCG tablet Commonly known as: SYNTHROID Take 1 tablet (50 mcg total) by mouth daily.   lidocaine 5 % Commonly known as: Lidoderm Place 1 patch onto the skin daily. Remove & Discard patch within 12 hours.  Apply at the back pain area.   metFORMIN 500 MG tablet Commonly known as: Glucophage Take 1 tablet (500 mg total) by mouth 2 (two) times daily with a meal.   nicotine 21 mg/24hr patch Commonly known as: NICODERM CQ - dosed in mg/24 hours Place 1 patch (21 mg total) onto the skin daily. Start taking on: April 27, 2022   nitroGLYCERIN 0.4 MG SL tablet Commonly known as: NITROSTAT Place 1 tablet (0.4 mg total) under the tongue every 5 (five) minutes as needed for chest pain.   OVER THE COUNTER MEDICATION Apply 1 Application topically daily. CBD '3000mg'$  cream for knees.   oxyCODONE-acetaminophen 5-325 MG tablet Commonly known as: PERCOCET/ROXICET Take 1 tablet by mouth every 8 (eight) hours as needed for up to 5 days for moderate pain or severe pain.   pantoprazole 40 MG tablet Commonly known as: Protonix Take 1 tablet (40 mg total)  by mouth 2 (two) times daily before a meal for 84 days, THEN 1 tablet (40 mg total) daily. Start taking on: April 26, 2022   ranolazine 1000 MG SR tablet Commonly known as: RANEXA TAKE 1 TABLET BY MOUTH TWICE A DAY What changed: when to take this   rosuvastatin 40 MG tablet Commonly known as: CRESTOR Take 1 tablet (40 mg total) by mouth daily. NEED OV.        Discharge Assessment: Vitals:   04/26/22 0453 04/26/22 0726  BP: 115/79 124/71  Pulse: (!) 53 81  Resp: 15 16  Temp: 98.2 F (36.8 C) 97.7 F (36.5 C)  SpO2: (!) 85%  100%   Skin clean, dry and intact without evidence of skin break down, no evidence of skin tears noted. IV catheter discontinued intact. Site without signs and symptoms of complications - no redness or edema noted at insertion site, patient denies c/o pain - only slight tenderness at site.  Dressing with slight pressure applied.  D/c Instructions-Education: Discharge instructions given to patient/family with verbalized understanding. D/c education completed with patient/family including follow up instructions, medication list, d/c activities limitations if indicated, with other d/c instructions as indicated by MD - patient able to verbalize understanding, all questions fully answered. Patient instructed to return to ED, call 911, or call MD for any changes in condition.  Patient escorted via [place], and D/C home via private auto.  Atilano Ina, RN 04/26/2022 11:33 AM

## 2022-04-26 NOTE — Discharge Instructions (Signed)
Follow up with your primary care provider in one week.  Check CBC, BMP, magnesium in the next visit Follow-up with Dr. Lyndel Safe [facility] in 8 weeks.  Plan for upper GI endoscopy in 12 weeks. Patient was  encouraged quitting smoking.  Has been prescribed nicotine patch.

## 2022-04-27 ENCOUNTER — Encounter (HOSPITAL_COMMUNITY): Payer: Self-pay | Admitting: Gastroenterology

## 2022-04-27 ENCOUNTER — Encounter: Payer: Self-pay | Admitting: Gastroenterology

## 2022-04-28 ENCOUNTER — Telehealth: Payer: Self-pay

## 2022-04-28 NOTE — Telephone Encounter (Signed)
Transition Care Management Unsuccessful Follow-up Telephone Call  Date of discharge and from where:  [place] [facility] 04/26/22 Dx: Hematochezia  Attempts:  1st Attempt  Reason for unsuccessful TCM follow-up call:  Left voice message   Juanda Crumble LPN [place] Direct Dial [phone]  Transition Care Management Unsuccessful Follow-up Telephone Call  Date of discharge and from where:  [facility] 04/26/22 Dx: Hematochezia   Attempts:  2nd Attempt  Reason for unsuccessful TCM follow-up call:  Left voice message   Juanda Crumble LPN [place] Direct Dial [phone]  Transition Care Management Unsuccessful Follow-up Telephone Call  Date of discharge and from where:  [facility] 04/26/22 Dx: Hematochezia   Attempts:  3rd Attempt  Reason for unsuccessful TCM follow-up call:  Left voice message   Juanda Crumble LPN [place] Direct Dial [phone]

## 2022-05-07 ENCOUNTER — Other Ambulatory Visit: Payer: Self-pay | Admitting: Family

## 2022-05-09 ENCOUNTER — Encounter: Payer: Self-pay | Admitting: Family

## 2022-05-09 ENCOUNTER — Ambulatory Visit (INDEPENDENT_AMBULATORY_CARE_PROVIDER_SITE_OTHER): Payer: Medicare Other | Admitting: Family

## 2022-05-09 VITALS — BP 122/60 | HR 69 | Temp 97.9°F | Resp 18 | Ht 67.5 in | Wt 154.6 lb

## 2022-05-09 DIAGNOSIS — I6523 Occlusion and stenosis of bilateral carotid arteries: Secondary | ICD-10-CM

## 2022-05-09 DIAGNOSIS — E039 Hypothyroidism, unspecified: Secondary | ICD-10-CM

## 2022-05-09 DIAGNOSIS — Z720 Tobacco use: Secondary | ICD-10-CM

## 2022-05-09 DIAGNOSIS — M545 Low back pain, unspecified: Secondary | ICD-10-CM

## 2022-05-09 DIAGNOSIS — I200 Unstable angina: Secondary | ICD-10-CM

## 2022-05-09 DIAGNOSIS — J209 Acute bronchitis, unspecified: Secondary | ICD-10-CM

## 2022-05-09 DIAGNOSIS — K921 Melena: Secondary | ICD-10-CM | POA: Diagnosis not present

## 2022-05-09 DIAGNOSIS — G8929 Other chronic pain: Secondary | ICD-10-CM

## 2022-05-09 DIAGNOSIS — I214 Non-ST elevation (NSTEMI) myocardial infarction: Secondary | ICD-10-CM

## 2022-05-09 LAB — CBC WITH DIFFERENTIAL/PLATELET
Basophils Absolute: 0.1 10*3/uL (ref 0.0–0.1)
Basophils Relative: 0.5 % (ref 0.0–3.0)
Eosinophils Absolute: 0.1 10*3/uL (ref 0.0–0.7)
Eosinophils Relative: 0.6 % (ref 0.0–5.0)
HCT: 33.1 % — ABNORMAL LOW (ref 39.0–52.0)
Hemoglobin: 10.8 g/dL — ABNORMAL LOW (ref 13.0–17.0)
Lymphocytes Relative: 20.9 % (ref 12.0–46.0)
Lymphs Abs: 2.1 10*3/uL (ref 0.7–4.0)
MCHC: 32.5 g/dL (ref 30.0–36.0)
MCV: 96.6 fl (ref 78.0–100.0)
Monocytes Absolute: 0.9 10*3/uL (ref 0.1–1.0)
Monocytes Relative: 9.4 % (ref 3.0–12.0)
Neutro Abs: 6.9 10*3/uL (ref 1.4–7.7)
Neutrophils Relative %: 68.6 % (ref 43.0–77.0)
Platelets: 381 10*3/uL (ref 150.0–400.0)
RBC: 3.43 Mil/uL — ABNORMAL LOW (ref 4.22–5.81)
RDW: 16.3 % — ABNORMAL HIGH (ref 11.5–15.5)
WBC: 10.1 10*3/uL (ref 4.0–10.5)

## 2022-05-09 LAB — COMPREHENSIVE METABOLIC PANEL
ALT: 56 U/L — ABNORMAL HIGH (ref 0–53)
AST: 14 U/L (ref 0–37)
Albumin: 3.9 g/dL (ref 3.5–5.2)
Alkaline Phosphatase: 133 U/L — ABNORMAL HIGH (ref 39–117)
BUN: 8 mg/dL (ref 6–23)
CO2: 24 mEq/L (ref 19–32)
Calcium: 8.9 mg/dL (ref 8.4–10.5)
Chloride: 97 mEq/L (ref 96–112)
Creatinine, Ser: 1.16 mg/dL (ref 0.40–1.50)
GFR: 64.17 mL/min (ref >60.00)
Glucose, Bld: 101 mg/dL — ABNORMAL HIGH (ref 70–99)
Potassium: 3.6 mEq/L (ref 3.5–5.1)
Sodium: 132 mEq/L — ABNORMAL LOW (ref 135–145)
Total Bilirubin: 0.5 mg/dL (ref 0.2–1.2)
Total Protein: 6.6 g/dL (ref 6.0–8.3)

## 2022-05-09 LAB — MAGNESIUM: Magnesium: 1.7 mg/dL (ref 1.5–2.5)

## 2022-05-09 LAB — TSH: TSH: 3.69 u[IU]/mL (ref 0.35–5.50)

## 2022-05-09 MED ORDER — OXYCODONE-ACETAMINOPHEN 10-325 MG PO TABS: 1.0000 | ORAL_TABLET | Freq: Three times a day (TID) | ORAL | 0 refills | Status: AC | PRN

## 2022-05-09 MED ORDER — AMOXICILLIN-POT CLAVULANATE 875-125 MG PO TABS: 1.0000 | ORAL_TABLET | Freq: Two times a day (BID) | ORAL | 0 refills | Status: AC

## 2022-05-09 NOTE — Patient Instructions (Addendum)
You are overdue to see your cardiologist- we have put in a referral to Dr. Evette Georges office.  You will need to see your GI in follow- I have put in a referral to Dr. Steve Rattler office- you will need to be seen there by the end of December.   Do not take your aspirin until mid-December;

## 2022-05-09 NOTE — Progress Notes (Signed)
Jason [name] is a 71 y.o. male with the following history as recorded in EpicCare:  Patient Active Problem List   Diagnosis Date Noted   GIB (gastrointestinal bleeding) 04/25/2022   Hematochezia 04/22/2022   ABLA (acute blood loss anemia) 04/22/2022   Rib fractures 04/22/2022   Protein-calorie malnutrition, severe 09/05/2020   Malnutrition of moderate degree 06/13/2020   Benign neoplasm of ascending colon    Benign neoplasm of transverse colon    Benign neoplasm of sigmoid colon    Lower esophageal ring    Dysphagia    Antiplatelet or antithrombotic long-term use    Dehydration    Emesis    Diarrhea    AKI (acute kidney injury) ([place]) 06/06/2020   Hx of adenomatous colonic polyps 05/2020   Pressure injury of skin 12/04/2019   Critical lower limb ischemia ([place]) 11/26/2019   Wound of lower extremity 11/26/2019   Toe fracture, right 11/26/2019   Amputation of right great toe ([place]) 07/11/2019   Nicotine abuse 06/12/2019   Cellulitis of right foot 06/11/2019   Diabetic foot infection ([place]) 06/11/2019   PAD (peripheral artery disease) ([place]) 06/11/2019   Cellulitis 05/27/2019   Gangrene of toe ([place]) 05/27/2019   Status post lumbar spinal fusion 10/27/2017   Acute on chronic combined systolic and diastolic CHF (congestive heart failure) ([place]) 10/25/2016   NSTEMI (non-ST elevated myocardial infarction) ([place]) 10/22/2016   Hypothyroidism 08/17/2015   RBBB 05/11/2015   Hyponatremia 04/04/2015   Hypotension 04/04/2015   Hemispheric carotid artery syndrome    Carotid artery narrowing 03/28/2015   Carotid stenosis- moderate 2011, 95% 2016 s/p stent 02/21/2014   Unstable angina ([place]) 01/16/2014   Tobacco abuse 01/16/2014   Substance abuse in remission ([place]) 10/01/2012   Radiculitis 02/10/2012   CAD, CABG Feb 2011, cath x 4 since-medical Rx 10/03/2011   PVD, hx Rt femoral endarterectomy 2004 10/03/2011   DM (diabetes mellitus) ([place]) 10/02/2011   HTN (hypertension) 10/02/2011    Hyperlipidemia 10/02/2011    Current Outpatient Medications  Medication Sig Dispense Refill   amoxicillin-clavulanate (AUGMENTIN) 875-125 MG tablet Take 1 tablet by mouth 2 (two) times daily for 10 days. 20 tablet 0   acetaminophen (TYLENOL) 650 MG CR tablet Take 1 tablet (650 mg total) by mouth 2 (two) times daily as needed for pain.     albuterol (PROVENTIL) (2.5 MG/3ML) 0.083% nebulizer solution Take 3 mLs (2.5 mg total) by nebulization every 6 (six) hours as needed for wheezing or shortness of breath. 150 mL 0   [START ON 05/24/2022] aspirin EC 81 MG tablet Take 1 tablet (81 mg total) by mouth daily. Swallow whole.     clopidogrel (PLAVIX) 75 MG tablet Take 1 tablet (75 mg total) by mouth daily. Please schedule appointment for additional refills. 90 tablet 0   diphenhydrAMINE (BENADRYL) 25 MG tablet Take 75 mg by mouth at bedtime as needed for sleep.     docusate sodium (COLACE) 100 MG capsule Take 100 mg by mouth 2 (two) times daily as needed for mild constipation.     escitalopram (LEXAPRO) 20 MG tablet TAKE 1 TABLET BY MOUTH EVERY DAY 90 tablet 1   gabapentin (NEURONTIN) 300 MG capsule Take 2 capsules (600 mg total) by mouth 2 (two) times daily. 60 capsule 0   isosorbide mononitrate (IMDUR) 120 MG 24 hr tablet Take 1 tablet (120 mg total) by mouth daily. Schedule an appointment for further refills, final attempt (Patient taking differently: Take 120 mg by mouth daily.) 15 tablet  0   JARDIANCE 10 MG TABS tablet TAKE 1 TABLET EVERY DAY (Patient taking differently: Take 10 mg by mouth daily.) 30 tablet 6   levothyroxine (SYNTHROID) 50 MCG tablet Take 1 tablet (50 mcg total) by mouth daily. 90 tablet 1   lidocaine (LIDODERM) 5 % Place 1 patch onto the skin daily. Remove & Discard patch within 12 hours.  Apply at the back pain area. 30 patch 0   metFORMIN (GLUCOPHAGE) 500 MG tablet Take 1 tablet (500 mg total) by mouth 2 (two) times daily with a meal. 60 tablet 2   nitroGLYCERIN (NITROSTAT) 0.4 MG  SL tablet Place 1 tablet (0.4 mg total) under the tongue every 5 (five) minutes as needed for chest pain. 25 tablet 3   OVER THE COUNTER MEDICATION Apply 1 Application topically daily. CBD 3073m cream for knees.     pantoprazole (PROTONIX) 40 MG tablet Take 1 tablet (40 mg total) by mouth 2 (two) times daily before a meal for 84 days, THEN 1 tablet (40 mg total) daily. 258 tablet 0   ranolazine (RANEXA) 1000 MG SR tablet TAKE 1 TABLET BY MOUTH TWICE A DAY (Patient taking differently: Take 1,000 mg by mouth at bedtime.) 180 tablet 1   rosuvastatin (CRESTOR) 40 MG tablet Take 1 tablet (40 mg total) by mouth daily. NEED OV. 30 tablet 0   No current facility-administered medications for this visit.    Allergies: Coreg [carvedilol] and Sulfa antibiotics  Past Medical History:  Diagnosis Date   Anxiety    off xanax  and paxil since 3/13   Arthritis    Bilateral carotid artery disease ([place]    s/p R ICA stent 03/28/2015 with distal protection. Known chronically occluded L ICA. Carotid stent complicated by hypotension and acute stroke in [place]   Cataract    COPD (chronic obstructive pulmonary disease) ([place]    Coronary artery disease    a. s/p CABG in 2011 with LIMA-LAD, SVG-RI/OM, and SVG-PDA b. occluded SVG-PDA by cath in 2015 c. 10/2016: NSTEMI with cath showing thrombus along the distal graft to insertion of SVG-OM2 with DES placed.    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)    Skin cancer, basal cell    tumor basal cell rem from lft arm   Smoker    Substance abuse ([facility]      Past Surgical History:  Procedure Laterality Date   ABDOMINAL AORTOGRAM W/LOWER EXTREMITY Bilateral 05/31/2019   Procedure: ABDOMINAL AORTOGRAM W/LOWER EXTREMITY;  Surgeon: CWaynetta Sandy MD;  [facility];  Service: Cardiovascular;  Laterality: Bilateral;   AMPUTATION TOE Right 06/02/2019   Procedure: Amputation Right Great Toe and Second Toe;  Surgeon: CWaynetta Sandy MD;  [facility]  Service: Vascular;  Laterality: Right;   BACK SURGERY  Jan 2014, Dec 2011   Dr SClydene FakeDILATION N/A 06/13/2020   Procedure: BLarrie KassDILATION;  Surgeon: GGatha Mayer MD;  [facility]  Service: Endoscopy;  Laterality: N/A;   BIOPSY  06/13/2020   Procedure: BIOPSY;  Surgeon: Gatha Mayer, MD;  Location: [facility] ENDOSCOPY;  Service: Endoscopy;;   BIOPSY  04/24/2022   Procedure: BIOPSY;  Surgeon: [name], MD;  Location: [facility] ENDOSCOPY;  Service: Gastroenterology;;   Vilinda Blanks GRAFT FEMORAL-PERONEAL Left 11/29/2019   Procedure: Left Bypass Graft Femoral-Peroneal using non reversed Greater Sapphenous vein;  Surgeon: Waynetta Sandy, MD;  Location: [facility];  Service: Vascular;  Laterality: Left;   CARDIAC CATHETERIZATION  4/12   Medical Rx   COLONOSCOPY WITH PROPOFOL N/A 06/13/2020   Procedure: COLONOSCOPY WITH PROPOFOL;  Surgeon: Gatha Mayer, MD;  Location: [place];  Service: Endoscopy;  Laterality: N/A;   COLONOSCOPY WITH PROPOFOL N/A 04/24/2022   Procedure: COLONOSCOPY WITH PROPOFOL;  Surgeon: [name], MD;  Location: [place];  Service: Gastroenterology;  Laterality: N/A;   CORONARY ANGIOGRAM  01/17/14   med rx   CORONARY ANGIOGRAM  4/13   med Rx   CORONARY ANGIOGRAM  1/15   Med Rx   CORONARY ANGIOPLASTY  Jan 2004   RCA   CORONARY ARTERY BYPASS GRAFT  07/24/2009   L-LAD, SVG-RI/OM, SVG-PDA   CORONARY STENT INTERVENTION N/A 10/23/2016   Procedure: Coronary Stent Intervention;  Surgeon: Peter M Martinique, MD;  Location:  [place] [facility];  Service: Cardiovascular;  Laterality: N/A;   ENDARTERECTOMY FEMORAL Right 06/02/2019   Procedure: Endarterectomy External Iliac  Femoral Artery and Profunda;  Surgeon: Waynetta Sandy, MD;  Location: [facility];  Service: Vascular;  Laterality: Right;   ESOPHAGOGASTRODUODENOSCOPY (EGD) WITH PROPOFOL N/A 06/13/2020   Procedure: ESOPHAGOGASTRODUODENOSCOPY (EGD) WITH PROPOFOL;  Surgeon: Gatha Mayer, MD;  Location: [place];  Service: Endoscopy;  Laterality: N/A;   ESOPHAGOGASTRODUODENOSCOPY (EGD) WITH PROPOFOL N/A 04/24/2022   Procedure: ESOPHAGOGASTRODUODENOSCOPY (EGD) WITH PROPOFOL;  Surgeon: [name], MD;  Location: [place];  Service: Gastroenterology;  Laterality: N/A;   EYE SURGERY     cat bil   FEMORAL ARTERY - FEMORAL ARTERY BYPASS GRAFT Right 2004   femoral enarterectomy   FEMORAL-POPLITEAL BYPASS GRAFT Right 06/02/2019   Procedure: RIGHT LEG BYPASS GRAFT FEMORAL-POPLITEAL ARTERY using Gore Propaten Vascular Graft Removable Ring;  Surgeon: Waynetta Sandy, MD;  Location: [place];  Service: Vascular;  Laterality: Right;   LEFT HEART CATH AND CORS/GRAFTS ANGIOGRAPHY N/A 10/23/2016   Procedure: Left Heart Cath and Cors/Grafts Angiography;  Surgeon: Peter M Martinique, MD;  Location: [place] [facility];  Service: Cardiovascular;  Laterality: N/A;   LEFT HEART CATHETERIZATION WITH CORONARY ANGIOGRAM N/A 10/03/2011   Procedure: LEFT HEART CATHETERIZATION WITH CORONARY ANGIOGRAM;  Surgeon: Pixie Casino, MD;  Location: [facility] CATH LAB;  Service: Cardiovascular;  Laterality: N/A;   LEFT HEART CATHETERIZATION WITH CORONARY ANGIOGRAM N/A 07/08/2013   Procedure: LEFT HEART CATHETERIZATION WITH CORONARY ANGIOGRAM;  Surgeon: Blane Ohara, MD;  Location: [facility] CATH LAB;  Service: Cardiovascular;  Laterality: N/A;   LEFT HEART CATHETERIZATION WITH CORONARY/GRAFT ANGIOGRAM N/A 01/17/2014   Procedure: LEFT HEART CATHETERIZATION WITH Beatrix Fetters;   Surgeon: Troy Sine, MD;  Location: [facility] C[facility] LAB;  Service: Cardiovascular;  Laterality: N/A;   LOWER EXTREMITY ANGIOGRAPHY N/A 11/28/2019   Procedure: LOWER EXTREMITY ANGIOGRAPHY;  Surgeon: Waynetta Sandy, MD;  Location: [place] [facility];  Service: Cardiovascular;  Laterality: N/A;   PATCH ANGIOPLASTY Right 06/02/2019   Procedure: Patch Angioplasty using Hemashield Platinum Finesse Patch of the External Iliac Femoral Artery and Profunda;  Surgeon: Waynetta Sandy,  MD;  Location: [place];  Service: Vascular;  Laterality: Right;   PERIPHERAL VASCULAR BALLOON ANGIOPLASTY Right 11/28/2019   Procedure: PERIPHERAL VASCULAR BALLOON ANGIOPLASTY;  Surgeon: Waynetta Sandy, MD;  Location: [place] [facility];  Service: Cardiovascular;  Laterality: Right;  Common femoral   PERIPHERAL VASCULAR CATHETERIZATION N/A 03/28/2015   Procedure: Carotid PTA/Stent Intervention;  Surgeon: Lorretta Harp, MD;  Location: [place] [facility];  Service: Cardiovascular;  Laterality: N/A;   PERIPHERAL VASCULAR INTERVENTION Left 11/28/2019   Procedure: PERIPHERAL VASCULAR INTERVENTION;  Surgeon: Waynetta Sandy, MD;  Location: [place] [facility];  Service: Cardiovascular;  Laterality: Left;  external iliac   POLYPECTOMY  06/13/2020   Procedure: POLYPECTOMY;  Surgeon: Gatha Mayer, MD;  Location: [place];  Service: Endoscopy;;   RIGHT/LEFT HEART CATH AND CORONARY/GRAFT ANGIOGRAPHY N/A 09/01/2020   Procedure: RIGHT/LEFT HEART CATH AND CORONARY/GRAFT ANGIOGRAPHY;  Surgeon: Leonie Man, MD;  Location: [place] [facility];  Service: Cardiovascular;  Laterality: N/A;   US EXTREMITY*L*     lft arm tumor removed     Family History  Problem Relation Age of Onset   Arthritis Mother    Heart disease Father     Social History   Tobacco Use   Smoking status: Every Day    Packs/day: 1.00    Years: 48.00    Total pack years: 48.00    Types: Cigarettes   Smokeless tobacco: Never   Substance Use Topics   Alcohol use: Yes    Alcohol/week: 0.0 standard drinks of alcohol    Comment: socially    Subjective:   Patient was hospitalized from 10/31-11/4 secondary to fall and GI bleed; will need to see his GI and cardiologist in follow up;  Admits he was talking large amounts of Ibuprofen due to back pain; is also on Plavix and ASA; He is concerned about persisting cough/ congestion x 1 month- notes he is prone to recurrent bronchitis 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  Head: Normocephalic and atraumatic  Eyes: Sclera and conjunctiva clear; pupils round and reactive to light; extraocular movements intact  Ears: External normal; canals clear; tympanic membranes normal  Oropharynx: Pink, supple. No suspicious lesions  Neck: Supple without thyromegaly, adenopathy  Lungs: Respirations unlabored; clear to auscultation bilaterally without wheeze, rales, rhonchi  CVS exam: normal rate and regular rhythm.  Musculoskeletal: No deformities; no active joint inflammation  Extremities: No edema, cyanosis, clubbing  Vessels: Symmetric bilaterally  Neurologic: Alert and oriented; speech intact; face symmetrical; limited mobility due to pain;  Assessment:  1. Hematochezia   2. Low serum magnesium level   3. Hypothyroidism, unspecified type   4. Chronic low back pain, unspecified back pain laterality, unspecified whether sciatica present   5. Bilateral carotid artery stenosis   6. Unstable angina ([place])   7. NSTEMI (non-ST elevated myocardial infarction) ([place])   8. Acute bronchitis, unspecified organism   9. Tobacco abuse      Plan:  Will update labs as requested for hospital follow up; referrals  are updated to GI and cardiology as well; Patient is interested in seeing orthopedist about chronic back issues- refill updated on Oxycodone and referral updated; handicapped placard given as well;  Rx for Augmentin 875 mg bid x 10 days; encouraged to quit smoking but he notes he is not ready at this time.  No follow-ups on file.  Orders Placed This Encounter  Procedures   CBC with Differential/Platelet   Comp Met (CMET)   Magnesium   TSH   Ambulatory referral to Gastroenterology    Referral Priority:   Routine    Referral Type:   Consultation    Referral Reason:   Specialty Services Required    Referred to Provider:   [name], MD    Number of Visits Requested:   1   Ambulatory referral to Orthopedic Surgery    Referral Priority:   Routine    Referral Type:   Surgical    Referral Reason:   Specialty Services Required    Requested Specialty:   Orthopedic Surgery    Number of Visits Requested:   1   Ambulatory referral to Cardiology    Referral Priority:   Routine    Referral Type:   Consultation    Referral Reason:   Specialty Services Required    Referred to Provider:   Troy Sine, MD    Requested Specialty:   Cardiology    Number of Visits Requested:   1    Requested Prescriptions   Signed Prescriptions Disp Refills   amoxicillin-clavulanate (AUGMENTIN) 875-125 MG tablet 20 tablet 0    Sig: Take 1 tablet by mouth 2 (two) times daily for 10 days.

## 2022-05-14 ENCOUNTER — Telehealth: Payer: Self-pay | Admitting: Family

## 2022-05-14 NOTE — Telephone Encounter (Signed)
I have called the pt back and relayed the message from the provider and he stated understanding. I have informed him to call his GI provider to get scheduled with a f/u appt from them. He stated understanding and will call them even though he declined the phone number since he stated "I have it in my paperwork somewhere." I stated understanding.

## 2022-05-14 NOTE — Telephone Encounter (Signed)
Pt called to go over labs.  

## 2022-07-07 ENCOUNTER — Telehealth: Payer: Self-pay

## 2022-07-07 NOTE — Telephone Encounter (Signed)
Message Received: Today  Personal reminder Gillermina Hu, RN  Gillermina Hu, RN Personal reminder sent 04/28/2022  Per recent endoscopy letter sent to pt:  Pt requires and upper endoscopy in 12-18 weeks to ensure healing of the ulcers:

## 2022-07-07 NOTE — Telephone Encounter (Signed)
Left message for pt to call back to scheduled Endoscopy:

## 2022-07-09 NOTE — Telephone Encounter (Signed)
Left message for pt to call back

## 2022-07-10 NOTE — Telephone Encounter (Signed)
Left message for pt to call back

## 2022-07-11 NOTE — Telephone Encounter (Signed)
Left message for pt to call back

## 2022-07-14 NOTE — Telephone Encounter (Signed)
Left message for pt to call back: Unable to reach pt by phone after multiple attempts:  Letter was created and sent to pt via mail.

## 2022-07-17 ENCOUNTER — Telehealth: Payer: Self-pay

## 2022-07-17 NOTE — Telephone Encounter (Signed)
Transition Care Management Unsuccessful Follow-up Telephone Call  Date of discharge and from where:  [facility] 07/11/2022  Attempts:  1st Attempt  Reason for unsuccessful TCM follow-up call:  Left voice message Juanda Crumble, [place] Direct Dial [phone]

## 2022-07-18 NOTE — Telephone Encounter (Signed)
Patient deceased  Jason [name], [place] Direct Dial [phone]

## 2022-07-21 ENCOUNTER — Other Ambulatory Visit: Payer: Self-pay | Admitting: Cardiovascular Disease

## 2022-07-24 DEATH — deceased

## 2022-08-12 ENCOUNTER — Encounter: Payer: Self-pay | Admitting: Cardiovascular Disease
